# Patient Record
Sex: Female | Born: 1975
Health system: Southern US, Community
[De-identification: ages and names within clinical notes are randomized; demographics above are authoritative.]

## PROBLEM LIST (undated history)

## (undated) DIAGNOSIS — N838 Other noninflammatory disorders of ovary, fallopian tube and broad ligament: Secondary | ICD-10-CM

## (undated) DIAGNOSIS — N200 Calculus of kidney: Secondary | ICD-10-CM

## (undated) DIAGNOSIS — F419 Anxiety disorder, unspecified: Secondary | ICD-10-CM

## (undated) DIAGNOSIS — I10 Essential (primary) hypertension: Secondary | ICD-10-CM

## (undated) DIAGNOSIS — E041 Nontoxic single thyroid nodule: Secondary | ICD-10-CM

## (undated) DIAGNOSIS — F32A Depression, unspecified: Secondary | ICD-10-CM

## (undated) DIAGNOSIS — I739 Peripheral vascular disease, unspecified: Secondary | ICD-10-CM

## (undated) DIAGNOSIS — Z86718 Personal history of other venous thrombosis and embolism: Secondary | ICD-10-CM

## (undated) DIAGNOSIS — D509 Iron deficiency anemia, unspecified: Secondary | ICD-10-CM

## (undated) DIAGNOSIS — K0889 Other specified disorders of teeth and supporting structures: Secondary | ICD-10-CM

## (undated) DIAGNOSIS — E119 Type 2 diabetes mellitus without complications: Secondary | ICD-10-CM

## (undated) DIAGNOSIS — F1910 Other psychoactive substance abuse, uncomplicated: Secondary | ICD-10-CM

## (undated) DIAGNOSIS — K219 Gastro-esophageal reflux disease without esophagitis: Secondary | ICD-10-CM

## (undated) DIAGNOSIS — Z87442 Personal history of urinary calculi: Secondary | ICD-10-CM

## (undated) DIAGNOSIS — R9431 Abnormal electrocardiogram [ECG] [EKG]: Secondary | ICD-10-CM

## (undated) DIAGNOSIS — I779 Disorder of arteries and arterioles, unspecified: Secondary | ICD-10-CM

## (undated) DIAGNOSIS — F329 Major depressive disorder, single episode, unspecified: Secondary | ICD-10-CM

## (undated) HISTORY — DX: Calculus of kidney: N20.0

## (undated) HISTORY — DX: Gastro-esophageal reflux disease without esophagitis: K21.9

## (undated) HISTORY — DX: Hypercalcemia: E83.52

## (undated) HISTORY — DX: Other psychoactive substance abuse, uncomplicated: F19.10

## (undated) HISTORY — PX: TUBAL LIGATION: SHX77

## (undated) HISTORY — DX: Personal history of other venous thrombosis and embolism: Z86.718

## (undated) HISTORY — DX: Anxiety disorder, unspecified: F41.9

---
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
## 1997-11-04 ENCOUNTER — Encounter (HOSPITAL_COMMUNITY): Admission: RE | Admit: 1997-11-04 | Discharge: 1997-11-04 | Payer: Self-pay | Admitting: *Deleted

## 1997-11-04 ENCOUNTER — Inpatient Hospital Stay (HOSPITAL_COMMUNITY): Admission: AD | Admit: 1997-11-04 | Discharge: 1997-11-08 | Payer: Self-pay | Admitting: Obstetrics

## 1998-01-03 ENCOUNTER — Emergency Department (HOSPITAL_COMMUNITY): Admission: EM | Admit: 1998-01-03 | Discharge: 1998-01-03 | Payer: Self-pay | Admitting: Emergency Medicine

## 1999-10-08 ENCOUNTER — Emergency Department (HOSPITAL_COMMUNITY): Admission: EM | Admit: 1999-10-08 | Discharge: 1999-10-08 | Payer: Self-pay | Admitting: Emergency Medicine

## 1999-10-10 ENCOUNTER — Emergency Department (HOSPITAL_COMMUNITY): Admission: EM | Admit: 1999-10-10 | Discharge: 1999-10-10 | Payer: Self-pay | Admitting: *Deleted

## 2001-07-17 ENCOUNTER — Emergency Department (HOSPITAL_COMMUNITY): Admission: EM | Admit: 2001-07-17 | Discharge: 2001-07-18 | Payer: Self-pay | Admitting: Emergency Medicine

## 2001-07-18 ENCOUNTER — Encounter: Payer: Self-pay | Admitting: Emergency Medicine

## 2001-11-10 ENCOUNTER — Emergency Department (HOSPITAL_COMMUNITY): Admission: EM | Admit: 2001-11-10 | Discharge: 2001-11-10 | Payer: Self-pay | Admitting: Emergency Medicine

## 2001-11-12 ENCOUNTER — Emergency Department (HOSPITAL_COMMUNITY): Admission: EM | Admit: 2001-11-12 | Discharge: 2001-11-12 | Payer: Self-pay

## 2002-10-21 ENCOUNTER — Emergency Department (HOSPITAL_COMMUNITY): Admission: EM | Admit: 2002-10-21 | Discharge: 2002-10-21 | Payer: Self-pay | Admitting: Emergency Medicine

## 2002-10-21 ENCOUNTER — Encounter: Payer: Self-pay | Admitting: Emergency Medicine

## 2002-10-22 ENCOUNTER — Encounter: Payer: Self-pay | Admitting: Internal Medicine

## 2002-10-22 ENCOUNTER — Emergency Department (HOSPITAL_COMMUNITY): Admission: EM | Admit: 2002-10-22 | Discharge: 2002-10-22 | Payer: Self-pay | Admitting: Emergency Medicine

## 2002-10-23 ENCOUNTER — Inpatient Hospital Stay (HOSPITAL_COMMUNITY): Admission: EM | Admit: 2002-10-23 | Discharge: 2002-10-23 | Payer: Self-pay | Admitting: Internal Medicine

## 2002-11-06 ENCOUNTER — Emergency Department (HOSPITAL_COMMUNITY): Admission: EM | Admit: 2002-11-06 | Discharge: 2002-11-06 | Payer: Self-pay | Admitting: Emergency Medicine

## 2003-02-07 ENCOUNTER — Emergency Department (HOSPITAL_COMMUNITY): Admission: EM | Admit: 2003-02-07 | Discharge: 2003-02-07 | Payer: Self-pay | Admitting: Emergency Medicine

## 2003-02-22 ENCOUNTER — Encounter: Payer: Self-pay | Admitting: Emergency Medicine

## 2003-02-22 ENCOUNTER — Emergency Department (HOSPITAL_COMMUNITY): Admission: EM | Admit: 2003-02-22 | Discharge: 2003-02-22 | Payer: Self-pay | Admitting: Emergency Medicine

## 2003-03-13 ENCOUNTER — Emergency Department (HOSPITAL_COMMUNITY): Admission: EM | Admit: 2003-03-13 | Discharge: 2003-03-13 | Payer: Self-pay | Admitting: Emergency Medicine

## 2003-05-05 ENCOUNTER — Inpatient Hospital Stay (HOSPITAL_COMMUNITY): Admission: AD | Admit: 2003-05-05 | Discharge: 2003-05-05 | Payer: Self-pay | Admitting: *Deleted

## 2003-05-06 ENCOUNTER — Emergency Department (HOSPITAL_COMMUNITY): Admission: EM | Admit: 2003-05-06 | Discharge: 2003-05-06 | Payer: Self-pay

## 2003-12-09 ENCOUNTER — Emergency Department (HOSPITAL_COMMUNITY): Admission: EM | Admit: 2003-12-09 | Discharge: 2003-12-09 | Payer: Self-pay | Admitting: Emergency Medicine

## 2004-09-05 ENCOUNTER — Emergency Department (HOSPITAL_COMMUNITY): Admission: EM | Admit: 2004-09-05 | Discharge: 2004-09-05 | Payer: Self-pay | Admitting: Family Medicine

## 2004-09-13 ENCOUNTER — Emergency Department (HOSPITAL_COMMUNITY): Admission: EM | Admit: 2004-09-13 | Discharge: 2004-09-13 | Payer: Self-pay | Admitting: Family Medicine

## 2004-09-13 ENCOUNTER — Ambulatory Visit (HOSPITAL_COMMUNITY): Admission: RE | Admit: 2004-09-13 | Discharge: 2004-09-13 | Payer: Self-pay | Admitting: Family Medicine

## 2004-09-16 ENCOUNTER — Ambulatory Visit (HOSPITAL_BASED_OUTPATIENT_CLINIC_OR_DEPARTMENT_OTHER): Admission: RE | Admit: 2004-09-16 | Discharge: 2004-09-16 | Payer: Self-pay | Admitting: Family Medicine

## 2004-09-20 ENCOUNTER — Emergency Department (HOSPITAL_COMMUNITY): Admission: EM | Admit: 2004-09-20 | Discharge: 2004-09-21 | Payer: Self-pay | Admitting: Emergency Medicine

## 2004-09-24 ENCOUNTER — Encounter: Admission: RE | Admit: 2004-09-24 | Discharge: 2004-09-24 | Payer: Self-pay | Admitting: Urology

## 2004-09-25 ENCOUNTER — Ambulatory Visit (HOSPITAL_COMMUNITY): Admission: RE | Admit: 2004-09-25 | Discharge: 2004-09-25 | Payer: Self-pay | Admitting: Urology

## 2004-09-25 ENCOUNTER — Ambulatory Visit (HOSPITAL_BASED_OUTPATIENT_CLINIC_OR_DEPARTMENT_OTHER): Admission: RE | Admit: 2004-09-25 | Discharge: 2004-09-25 | Payer: Self-pay | Admitting: Urology

## 2004-11-29 ENCOUNTER — Emergency Department (HOSPITAL_COMMUNITY): Admission: EM | Admit: 2004-11-29 | Discharge: 2004-11-29 | Payer: Self-pay | Admitting: Family Medicine

## 2004-12-29 ENCOUNTER — Emergency Department (HOSPITAL_COMMUNITY): Admission: EM | Admit: 2004-12-29 | Discharge: 2004-12-29 | Payer: Self-pay | Admitting: Family Medicine

## 2005-03-29 ENCOUNTER — Observation Stay (HOSPITAL_COMMUNITY): Admission: RE | Admit: 2005-03-29 | Discharge: 2005-03-30 | Payer: Self-pay | Admitting: Urology

## 2005-04-09 ENCOUNTER — Ambulatory Visit (HOSPITAL_COMMUNITY): Admission: RE | Admit: 2005-04-09 | Discharge: 2005-04-09 | Payer: Self-pay | Admitting: Urology

## 2005-09-19 ENCOUNTER — Emergency Department (HOSPITAL_COMMUNITY): Admission: EM | Admit: 2005-09-19 | Discharge: 2005-09-19 | Payer: Self-pay | Admitting: Family Medicine

## 2006-02-13 ENCOUNTER — Emergency Department (HOSPITAL_COMMUNITY): Admission: EM | Admit: 2006-02-13 | Discharge: 2006-02-13 | Payer: Self-pay | Admitting: Family Medicine

## 2006-03-15 ENCOUNTER — Emergency Department (HOSPITAL_COMMUNITY): Admission: EM | Admit: 2006-03-15 | Discharge: 2006-03-15 | Payer: Self-pay | Admitting: Family Medicine

## 2006-06-22 ENCOUNTER — Encounter: Admission: RE | Admit: 2006-06-22 | Discharge: 2006-06-22 | Payer: Self-pay | Admitting: Family Medicine

## 2006-07-13 ENCOUNTER — Emergency Department (HOSPITAL_COMMUNITY): Admission: EM | Admit: 2006-07-13 | Discharge: 2006-07-13 | Payer: Self-pay | Admitting: Family Medicine

## 2007-05-26 ENCOUNTER — Emergency Department (HOSPITAL_COMMUNITY): Admission: EM | Admit: 2007-05-26 | Discharge: 2007-05-26 | Payer: Self-pay | Admitting: Emergency Medicine

## 2007-06-19 ENCOUNTER — Emergency Department (HOSPITAL_COMMUNITY): Admission: EM | Admit: 2007-06-19 | Discharge: 2007-06-19 | Payer: Self-pay | Admitting: Emergency Medicine

## 2008-01-25 ENCOUNTER — Emergency Department (HOSPITAL_COMMUNITY): Admission: EM | Admit: 2008-01-25 | Discharge: 2008-01-25 | Payer: Self-pay | Admitting: Family Medicine

## 2008-03-05 ENCOUNTER — Emergency Department (HOSPITAL_COMMUNITY): Admission: EM | Admit: 2008-03-05 | Discharge: 2008-03-05 | Payer: Self-pay | Admitting: Emergency Medicine

## 2008-07-28 ENCOUNTER — Emergency Department (HOSPITAL_COMMUNITY): Admission: EM | Admit: 2008-07-28 | Discharge: 2008-07-28 | Payer: Self-pay | Admitting: Emergency Medicine

## 2008-08-09 ENCOUNTER — Ambulatory Visit (HOSPITAL_COMMUNITY): Admission: RE | Admit: 2008-08-09 | Discharge: 2008-08-09 | Payer: Self-pay | Admitting: Gastroenterology

## 2008-08-22 ENCOUNTER — Encounter: Admission: RE | Admit: 2008-08-22 | Discharge: 2008-08-22 | Payer: Self-pay | Admitting: Gastroenterology

## 2008-10-06 ENCOUNTER — Emergency Department (HOSPITAL_COMMUNITY): Admission: EM | Admit: 2008-10-06 | Discharge: 2008-10-06 | Payer: Self-pay | Admitting: Emergency Medicine

## 2009-11-10 ENCOUNTER — Observation Stay (HOSPITAL_COMMUNITY): Admission: EM | Admit: 2009-11-10 | Discharge: 2009-11-10 | Payer: Self-pay | Admitting: Emergency Medicine

## 2009-11-17 LAB — HM PAP SMEAR: HM Pap smear: NORMAL

## 2009-12-10 ENCOUNTER — Ambulatory Visit (HOSPITAL_COMMUNITY): Admission: RE | Admit: 2009-12-10 | Discharge: 2009-12-10 | Payer: Self-pay | Admitting: Surgery

## 2010-01-30 ENCOUNTER — Encounter (INDEPENDENT_AMBULATORY_CARE_PROVIDER_SITE_OTHER): Payer: Self-pay | Admitting: Surgery

## 2010-01-30 ENCOUNTER — Ambulatory Visit (HOSPITAL_COMMUNITY): Admission: RE | Admit: 2010-01-30 | Discharge: 2010-01-31 | Payer: Self-pay | Admitting: Surgery

## 2010-02-01 HISTORY — PX: OTHER SURGICAL HISTORY: SHX169

## 2010-08-25 ENCOUNTER — Emergency Department (HOSPITAL_COMMUNITY): Admission: EM | Admit: 2010-08-25 | Discharge: 2010-08-25 | Payer: Self-pay | Admitting: Emergency Medicine

## 2010-09-30 ENCOUNTER — Emergency Department (HOSPITAL_COMMUNITY)
Admission: EM | Admit: 2010-09-30 | Discharge: 2010-09-30 | Payer: Self-pay | Source: Home / Self Care | Admitting: Emergency Medicine

## 2010-10-24 ENCOUNTER — Encounter: Payer: Self-pay | Admitting: Family Medicine

## 2010-12-14 LAB — URINALYSIS, ROUTINE W REFLEX MICROSCOPIC
Bilirubin Urine: NEGATIVE
Glucose, UA: 100 mg/dL — AB
Hgb urine dipstick: NEGATIVE
Ketones, ur: NEGATIVE mg/dL
Nitrite: NEGATIVE
Protein, ur: NEGATIVE mg/dL
Specific Gravity, Urine: 1.015 (ref 1.005–1.030)
Urobilinogen, UA: 0.2 mg/dL (ref 0.0–1.0)
pH: 7 (ref 5.0–8.0)

## 2010-12-14 LAB — DIFFERENTIAL
Basophils Absolute: 0.1 10*3/uL (ref 0.0–0.1)
Basophils Relative: 1 % (ref 0–1)
Eosinophils Absolute: 0.7 10*3/uL (ref 0.0–0.7)
Eosinophils Relative: 5 % (ref 0–5)
Lymphocytes Relative: 25 % (ref 12–46)
Lymphs Abs: 3.9 10*3/uL (ref 0.7–4.0)
Monocytes Absolute: 1 10*3/uL (ref 0.1–1.0)
Monocytes Relative: 6 % (ref 3–12)
Neutro Abs: 10.2 10*3/uL — ABNORMAL HIGH (ref 1.7–7.7)
Neutrophils Relative %: 64 % (ref 43–77)

## 2010-12-14 LAB — COMPREHENSIVE METABOLIC PANEL
ALT: 22 U/L (ref 0–35)
AST: 18 U/L (ref 0–37)
Albumin: 3.6 g/dL (ref 3.5–5.2)
Alkaline Phosphatase: 72 U/L (ref 39–117)
BUN: 14 mg/dL (ref 6–23)
CO2: 25 mEq/L (ref 19–32)
Calcium: 9.7 mg/dL (ref 8.4–10.5)
Chloride: 108 mEq/L (ref 96–112)
Creatinine, Ser: 1.27 mg/dL — ABNORMAL HIGH (ref 0.4–1.2)
GFR calc Af Amer: 58 mL/min — ABNORMAL LOW (ref >60)
GFR calc non Af Amer: 48 mL/min — ABNORMAL LOW (ref >60)
Glucose, Bld: 131 mg/dL — ABNORMAL HIGH (ref 70–99)
Potassium: 4.2 mEq/L (ref 3.5–5.1)
Sodium: 141 mEq/L (ref 135–145)
Total Bilirubin: 0.3 mg/dL (ref 0.3–1.2)
Total Protein: 7 g/dL (ref 6.0–8.3)

## 2010-12-14 LAB — URINE MICROSCOPIC-ADD ON

## 2010-12-14 LAB — CBC
HCT: 45.5 % (ref 36.0–46.0)
Hemoglobin: 15.2 g/dL — ABNORMAL HIGH (ref 12.0–15.0)
MCH: 31.7 pg (ref 26.0–34.0)
MCHC: 33.4 g/dL (ref 30.0–36.0)
MCV: 95 fL (ref 78.0–100.0)
Platelets: 353 10*3/uL (ref 150–400)
RBC: 4.79 MIL/uL (ref 3.87–5.11)
RDW: 14.3 % (ref 11.5–15.5)
WBC: 16 10*3/uL — ABNORMAL HIGH (ref 4.0–10.5)

## 2010-12-14 LAB — PREGNANCY, URINE: Preg Test, Ur: NEGATIVE

## 2010-12-15 LAB — URINALYSIS, ROUTINE W REFLEX MICROSCOPIC
Bilirubin Urine: NEGATIVE
Glucose, UA: NEGATIVE mg/dL
Hgb urine dipstick: NEGATIVE
Ketones, ur: NEGATIVE mg/dL
Nitrite: NEGATIVE
Protein, ur: NEGATIVE mg/dL
Specific Gravity, Urine: 1.014 (ref 1.005–1.030)
Urobilinogen, UA: 0.2 mg/dL (ref 0.0–1.0)
pH: 6.5 (ref 5.0–8.0)

## 2010-12-15 LAB — URINE MICROSCOPIC-ADD ON

## 2010-12-19 ENCOUNTER — Emergency Department (HOSPITAL_COMMUNITY): Payer: Medicaid Other

## 2010-12-19 ENCOUNTER — Emergency Department (HOSPITAL_COMMUNITY)
Admission: EM | Admit: 2010-12-19 | Discharge: 2010-12-19 | Disposition: A | Discharge status: Home / Self Care | Payer: Medicaid Other | Attending: Emergency Medicine | Admitting: Emergency Medicine

## 2010-12-19 DIAGNOSIS — Q760 Spina bifida occulta: Secondary | ICD-10-CM | POA: Insufficient documentation

## 2010-12-19 DIAGNOSIS — N200 Calculus of kidney: Secondary | ICD-10-CM | POA: Insufficient documentation

## 2010-12-19 DIAGNOSIS — I10 Essential (primary) hypertension: Secondary | ICD-10-CM | POA: Insufficient documentation

## 2010-12-19 DIAGNOSIS — R109 Unspecified abdominal pain: Secondary | ICD-10-CM | POA: Insufficient documentation

## 2010-12-19 LAB — URINALYSIS, ROUTINE W REFLEX MICROSCOPIC
Bilirubin Urine: NEGATIVE
Glucose, UA: NEGATIVE mg/dL
Hgb urine dipstick: NEGATIVE
Ketones, ur: NEGATIVE mg/dL
Nitrite: NEGATIVE
Protein, ur: NEGATIVE mg/dL
Specific Gravity, Urine: 1.016 (ref 1.005–1.030)
Urobilinogen, UA: 0.2 mg/dL (ref 0.0–1.0)
pH: 7 (ref 5.0–8.0)

## 2010-12-19 LAB — POCT I-STAT, CHEM 8
BUN: 7 mg/dL (ref 6–23)
Calcium, Ion: 1.19 mmol/L (ref 1.12–1.32)
Chloride: 104 mEq/L (ref 96–112)
Creatinine, Ser: 1.7 mg/dL — ABNORMAL HIGH (ref 0.4–1.2)
Glucose, Bld: 116 mg/dL — ABNORMAL HIGH (ref 70–99)
HCT: 47 % — ABNORMAL HIGH (ref 36.0–46.0)
Hemoglobin: 16 g/dL — ABNORMAL HIGH (ref 12.0–15.0)
Potassium: 4.4 mEq/L (ref 3.5–5.1)
Sodium: 141 mEq/L (ref 135–145)
TCO2: 25 mmol/L (ref 0–100)

## 2010-12-19 LAB — POCT PREGNANCY, URINE: Preg Test, Ur: NEGATIVE

## 2010-12-19 LAB — URINE MICROSCOPIC-ADD ON

## 2010-12-22 LAB — COMPREHENSIVE METABOLIC PANEL
ALT: 18 U/L (ref 0–35)
AST: 19 U/L (ref 0–37)
Albumin: 3.8 g/dL (ref 3.5–5.2)
Alkaline Phosphatase: 94 U/L (ref 39–117)
BUN: 12 mg/dL (ref 6–23)
CO2: 25 mEq/L (ref 19–32)
Calcium: 12.2 mg/dL — ABNORMAL HIGH (ref 8.4–10.5)
Chloride: 110 mEq/L (ref 96–112)
Creatinine, Ser: 1.51 mg/dL — ABNORMAL HIGH (ref 0.4–1.2)
GFR calc Af Amer: 47 mL/min — ABNORMAL LOW (ref >60)
GFR calc non Af Amer: 39 mL/min — ABNORMAL LOW (ref >60)
Glucose, Bld: 95 mg/dL (ref 70–99)
Potassium: 4.7 mEq/L (ref 3.5–5.1)
Sodium: 140 mEq/L (ref 135–145)
Total Bilirubin: 0.3 mg/dL (ref 0.3–1.2)
Total Protein: 6.8 g/dL (ref 6.0–8.3)

## 2010-12-22 LAB — PREGNANCY, URINE: Preg Test, Ur: NEGATIVE

## 2010-12-22 LAB — CALCIUM
Calcium: 10.4 mg/dL (ref 8.4–10.5)
Calcium: 10.5 mg/dL (ref 8.4–10.5)

## 2010-12-23 LAB — URINALYSIS, ROUTINE W REFLEX MICROSCOPIC
Bilirubin Urine: NEGATIVE
Glucose, UA: NEGATIVE mg/dL
Ketones, ur: NEGATIVE mg/dL
Nitrite: NEGATIVE
Protein, ur: NEGATIVE mg/dL
Specific Gravity, Urine: 1.01 (ref 1.005–1.030)
Urobilinogen, UA: 0.2 mg/dL (ref 0.0–1.0)
pH: 6 (ref 5.0–8.0)

## 2010-12-23 LAB — POCT PREGNANCY, URINE: Preg Test, Ur: NEGATIVE

## 2010-12-23 LAB — URINE MICROSCOPIC-ADD ON

## 2010-12-23 LAB — CBC
HCT: 41.4 % (ref 36.0–46.0)
Hemoglobin: 14 g/dL (ref 12.0–15.0)
MCHC: 33.7 g/dL (ref 30.0–36.0)
MCV: 98.5 fL (ref 78.0–100.0)
Platelets: 232 10*3/uL (ref 150–400)
RBC: 4.2 MIL/uL (ref 3.87–5.11)
RDW: 14.6 % (ref 11.5–15.5)
WBC: 10.3 10*3/uL (ref 4.0–10.5)

## 2010-12-23 LAB — POCT I-STAT, CHEM 8
BUN: 9 mg/dL (ref 6–23)
Calcium, Ion: 1.62 mmol/L — ABNORMAL HIGH (ref 1.12–1.32)
Chloride: 110 mEq/L (ref 96–112)
Creatinine, Ser: 1.3 mg/dL — ABNORMAL HIGH (ref 0.4–1.2)
Glucose, Bld: 118 mg/dL — ABNORMAL HIGH (ref 70–99)
HCT: 42 % (ref 36.0–46.0)
Hemoglobin: 14.3 g/dL (ref 12.0–15.0)
Potassium: 4.5 mEq/L (ref 3.5–5.1)
Sodium: 139 mEq/L (ref 135–145)
TCO2: 26 mmol/L (ref 0–100)

## 2010-12-23 LAB — DIFFERENTIAL
Basophils Absolute: 0.1 10*3/uL (ref 0.0–0.1)
Basophils Relative: 1 % (ref 0–1)
Eosinophils Absolute: 0.3 10*3/uL (ref 0.0–0.7)
Eosinophils Relative: 3 % (ref 0–5)
Lymphocytes Relative: 28 % (ref 12–46)
Lymphs Abs: 2.9 10*3/uL (ref 0.7–4.0)
Monocytes Absolute: 0.5 10*3/uL (ref 0.1–1.0)
Monocytes Relative: 5 % (ref 3–12)
Neutro Abs: 6.5 10*3/uL (ref 1.7–7.7)
Neutrophils Relative %: 63 % (ref 43–77)

## 2011-01-18 LAB — URINE CULTURE: Colony Count: 30000

## 2011-01-18 LAB — POCT I-STAT, CHEM 8
BUN: 15 mg/dL (ref 6–23)
Calcium, Ion: 1.67 mmol/L — ABNORMAL HIGH (ref 1.12–1.32)
Chloride: 111 mEq/L (ref 96–112)
Creatinine, Ser: 1.7 mg/dL — ABNORMAL HIGH (ref 0.4–1.2)
Glucose, Bld: 70 mg/dL (ref 70–99)
HCT: 45 % (ref 36.0–46.0)
Hemoglobin: 15.3 g/dL — ABNORMAL HIGH (ref 12.0–15.0)
Potassium: 4.2 mEq/L (ref 3.5–5.1)
Sodium: 141 mEq/L (ref 135–145)
TCO2: 22 mmol/L (ref 0–100)

## 2011-01-18 LAB — CBC
HCT: 41.5 % (ref 36.0–46.0)
Hemoglobin: 13.6 g/dL (ref 12.0–15.0)
MCHC: 32.8 g/dL (ref 30.0–36.0)
MCV: 92.7 fL (ref 78.0–100.0)
Platelets: 345 10*3/uL (ref 150–400)
RBC: 4.48 MIL/uL (ref 3.87–5.11)
RDW: 14 % (ref 11.5–15.5)
WBC: 10.1 10*3/uL (ref 4.0–10.5)

## 2011-01-18 LAB — DIFFERENTIAL
Basophils Absolute: 0.1 10*3/uL (ref 0.0–0.1)
Basophils Relative: 1 % (ref 0–1)
Eosinophils Absolute: 0.7 10*3/uL (ref 0.0–0.7)
Eosinophils Relative: 7 % — ABNORMAL HIGH (ref 0–5)
Lymphocytes Relative: 27 % (ref 12–46)
Lymphs Abs: 2.7 10*3/uL (ref 0.7–4.0)
Monocytes Absolute: 0.7 10*3/uL (ref 0.1–1.0)
Monocytes Relative: 7 % (ref 3–12)
Neutro Abs: 5.8 10*3/uL (ref 1.7–7.7)
Neutrophils Relative %: 58 % (ref 43–77)

## 2011-01-18 LAB — URINALYSIS, ROUTINE W REFLEX MICROSCOPIC
Bilirubin Urine: NEGATIVE
Glucose, UA: NEGATIVE mg/dL
Ketones, ur: NEGATIVE mg/dL
Nitrite: NEGATIVE
Protein, ur: 30 mg/dL — AB
Specific Gravity, Urine: 1.007 (ref 1.005–1.030)
Urobilinogen, UA: 0.2 mg/dL (ref 0.0–1.0)
pH: 6.5 (ref 5.0–8.0)

## 2011-01-18 LAB — URINE MICROSCOPIC-ADD ON

## 2011-01-18 LAB — PREGNANCY, URINE: Preg Test, Ur: NEGATIVE

## 2011-02-19 NOTE — Op Note (Signed)
Terri Sparks, Terri Sparks               ACCOUNT NO.:  [id number]   MEDICAL RECORD NO.:  [id number]          PATIENT TYPE:  AMB   LOCATION:  [facility]                         FACILITY:  [facility]   PHYSICIAN:  Shanda Bumps., M.D.DATE OF BIRTH:  May 26, 1976   DATE OF PROCEDURE:  09/25/2004  DATE OF DISCHARGE:                                 OPERATIVE REPORT   PREOPERATIVE DIAGNOSIS:  Right distal ureteral calculi (8 and 10 mm,  respectively).   POSTOPERATIVE DIAGNOSIS:  Right distal ureteral calculi (8 and 10 mm,  respectively).   OPERATION:  1.  Cysto.  2.  Right retrograde pyelogram.  3.  Ureteroscopy with holmium lasertripsy, insert right ureteral stent.   ANESTHESIA:  General.   SURGEON:  Corky Downs, M.D.   BRIEF HISTORY:  This 35 year old single white female presented with a 1  month history of right flank pain, was found to have some proximal right  ureteral stones on CT scan, December 11 but on a CT scan done yesterday, she  had 8 and 10 mm stones distal with high-grade hydro.  She also has large  bilateral stones, the largest of which is in the left kidney which measures  1.9 cm.  She had an episode of stones about 2 years ago but never passed  anything.  She enters now for ureteroscopy of these large stones in the  distal right ureter causing obstruction.  The patient was placed on the  operating table in dorsal lithotomy position, after satisfactory induction  of general anesthesia, was prepped and draped with Betadine and given IV  antibiotics.  The bladder was carefully inspected.  It was found to be free  of any mucosal lesions.  The right orifice was somewhat patulous, and an  open-ended 6 ureteral catheter was inserted, and a retrograde demonstrated  that the stones were indeed impacted in the lower ureter, and I could barely  get a little dye to past them.  I used a Glidewire to go past the stones but  could not advance the open-ended catheter.  I then  removed the Glidewire and  inserted a guidewire.  I could get this past the stones.  I then removed the  open-ended catheter and left the guidewire as a safety wire.   I then passed the 6 short ureteroscope up to the first stone which was about  8-10 cm from the UV junction.  Using the holmium laser at first 0.5 watts  and then 0.6, I was able to carefully break the stone as I advanced the  scope.  The first stone was about 10 mm in length, and I had to kind of just  tunnel through that with the ureteroscope and the laser using the camera and  then once I got past that, the second stone had to be treated in the same  way.  The stone did break up fairly well.  It was fairly soft.  Once I got  past the most proximal stone, I then used the four-wire basket and retrieved  as many of the fragments as I could.  There  were a few small fragments left  indwelling, but I could easily pass the scope up past where the stones were  at that point.  I then, over the guidewire, passed the cystoscope and fed a  6 French x 26 cm length double-J ureteral stent under fluoroscopy up to the  level of the kidney and  removed the guidewire with the stent in good position.  The bladder was  drained and the stones collected and the patient given a B&O suppository and  some Toradol IV.  She will be later discharged as an outpatient, will come  back in about 10 days to have the stent removed at that time.      HMK/MEDQ  D:  09/25/2004  T:  09/25/2004  Job:  [id number]

## 2011-02-19 NOTE — Procedures (Signed)
Terri Sparks, Terri Sparks               ACCOUNT NO.:  [id number]   MEDICAL RECORD NO.:  [id number]          PATIENT TYPE:  OUT   LOCATION:  SLEEP CENTER                 FACILITY:  [facility]   PHYSICIAN:  Clinton D. Annamaria Boots, M.D. DATE OF BIRTH:  05-16-1976   DATE OF STUDY:                              NOCTURNAL POLYSOMNOGRAM   REFERRING PHYSICIAN:  Redge Gainer, MD   INDICATION FOR STUDY:  Hypersomnia with sleep apnea.   EPWORTH SLEEPINESS SCORE:  8/24.   BMI:  38.6.   WEIGHT:  270 pounds.   The patient is taking pain medication for kidney stones.   SLEEP ARCHITECTURE:  Total sleep time 301 minutes, with sleep efficiency  86%.  Stage 1 with 3%, stage 2 83%, stages 3 and 4 were absent.  REM was 13%  of total sleep time.  Sleep latency 10.5 minutes, REM latency 69 minutes.  Awake after sleep onset 39 minutes.  Arousal index 11.  She took pain  medication just before lights out, and had two trips to the bathroom which  she attributed to prescribed increased fluid intake.   RESPIRATORY DATA:  RDI 4.8 obstructive events per hour, which is within  normal limits and does not meet diagnostic criteria for sleep disordered  breathing.  A total of one central apnea, two obstructive apneas, and 21  hypopneas were noted.  Events were not positional.  REM RDI was 13.  She did  not qualify for split study protocol because of insufficient events to  trigger CPAP titration on this study night.   OXYGEN DATA:  Light to moderate snoring, with desaturation to 88%.  Mean  oxygen saturation through the study was 95% on room air.   CARDIAC DATA:  Normal sinus rhythm.   MOVEMENT/PARASOMNIA:  Occasional leg jerks with arousals, insignificant.   IMPRESSION/RECOMMENDATION:  Occasional sleep disordered breathing events,  not qualifying for a diagnosis of obstructive sleep apnea/hypopnea syndrome,  RDI 4.8 per hour.  There may be occasional breakthrough nights related to  nasal congestion, sedation, or sleeping  on flat of back when scores would be  higher.                                                           Clinton D. Annamaria Boots, M.D.  Diplomate, [employer] Board   CDY/MEDQ  D:  09/20/2004 13:19:36  T:  09/21/2004 17:17:51  Job:  [id number]

## 2011-02-19 NOTE — Op Note (Signed)
NAMEELIZABETHROSE, Terri Sparks               ACCOUNT NO.:  [id number]   MEDICAL RECORD NO.:  [id number]          PATIENT TYPE:  AMB   LOCATION:  DAY                          FACILITY:  [place]   PHYSICIAN:  Mark C. Karsten Ro, M.D.  DATE OF BIRTH:  26-Jun-1976   DATE OF PROCEDURE:  04/09/2005  DATE OF DISCHARGE:                                 OPERATIVE REPORT   PREOPERATIVE DIAGNOSIS:  Left renal calculi.   POSTOPERATIVE DIAGNOSIS:  Left renal calculi.   PROCEDURE:  Second look percutaneous nephrostomy and percutaneous stone  extraction(>2cm.).   SURGEON:  Mark C. Karsten Ro, M.D.   ANESTHESIA:  General.   SPECIMENS:  None.   BLOOD LOSS:  Minimal.   COMPLICATIONS:  None.   DRAINS:  None.   INDICATIONS:  The patient is a 35 year old white female who had a very large  left renal calculus. She underwent percutaneous nephrostolithotomy a little  over a week ago with debulking of approximately 90% of the stone. There were  a few stones remaining and I left the percutaneous tube in place in order to  addressed these. She is brought to the OR today for second look nephroscopy  and removal of as many of the remaining stones as possible. The risks,  complications and alternatives were discussed with her. She understands and  elected to proceed.   DESCRIPTION OF OPERATION:  After informed consent, the patient was brought  to the  major OR, placed on the table and administered general anesthesia  and then moved to the prone position. She was padded appropriately with all  pressure points and then padded. A Foley catheter was placed in the bladder.  Her nephrostomy tube was then removed and her flank was sterilely prepped  and draped. A 17 French flexible cystoscope was then introduced per the  nephrostomy tract into the kidney. I was able to identify several stones  initially and grasped these with a nitinol basket and extracted those. I  then continued to inspect the lower pole and found several  other large  stones, grasped these and extracted those as well. I then negotiated the  scope into the upper pole. I noted a single stone in an upper pole but there  was a very stenosed infundibulum and the stone appeared to be adherent to  the upper pole papilla. I was unable to get the basket or any other grasping  devices up into that upper pole through the very small infundibulum to grasp  the stone therefore it was left in situ. I then relooked and found a few  other small stone fragments. These were grasped as well and extracted. The  remaining fragments were sand grain size and too small to practically grasp  and extract. I therefore removed the flexible cystoscope after fluoroscopic  confirmation that all the stones except the one in the upper pole appeared  to be gone and there and therefore closed the skin of the nephrostomy tract  with Steri-Strips. A sterile occlusive dressing was applied and the patient  was awakened and  taken to the recovery room in stable satisfactory condition.  She tolerated  the procedure well with no intraoperative complications. She will finish up  her antibiotics and be given a prescription for Tylox and will follow-up in  my office in four weeks for a final check.       MCO/MEDQ  D:  04/09/2005  T:  04/09/2005  Job:  [id number]

## 2011-02-19 NOTE — Op Note (Signed)
Terri Sparks, GEIGER               ACCOUNT NO.:  [id number]   MEDICAL RECORD NO.:  [id number]          PATIENT TYPE:  AMB   LOCATION:  DAY                          FACILITY:  [facility]   PHYSICIAN:  Westley Foots, M.D.   DATE OF BIRTH:  [birth year]   DATE OF PROCEDURE:  03/29/2005  DATE OF DISCHARGE:                                 OPERATIVE REPORT   PREOPERATIVE DIAGNOSIS:  Left kidney stones.   POSTOPERATIVE DIAGNOSIS:  Left kidney stones.   PROCEDURE:  Left percutaneous nephrostomy with nephrostolithotomy pass of  the left percutaneous nephrostolithotomy (stone mass greater than 2 cm).   ATTENDING SURGEON:  Mark C. Karsten Ro, M.D.   RESIDENT SURGEON:  Westley Foots, M.D.   INTERVENTIONAL RADIOLOGIST:  Eulas Post T. Kathlene Cote, M.D.   ANESTHESIA:  General endotracheal anesthesia.   COMPLICATIONS:  None.   INDICATIONS FOR PROCEDURE:  Terri Sparks is a 35 year old female who  [facility] Dr. Serita Butcher for a  history of kidney stones.  On evaluation recently the patient noted that she  had a kidney stone approximately two years ago but was seen and treated in  the emergency room, and never passed a stone.  The  CT scan done at [facility] on September 13, 2004, showed acute moderate right-sided  hydronephrosis on the basis of two obstructing calculi in the proximal right  ureter that measured 5 and 8 mm, respectively.  She had several calculi  present in the right kidney and multiple left-sided stones.  Due to the fact  that a large stone was located within the left renal pelvis as well as a  second large stone located in the left lower pole with infundibular stenosis  between that calix and the renal pelvis, a decision was made to proceed with  a percutaneous approach for treatment of her left kidney stones.  After  discussing this procedure with the patient as well as the risks and  consequences associated with such a surgery, she has  elected to proceed.   PROCEDURE IN DETAIL:  The patient was brought to the operating room.  Following induction of general endotracheal anesthesia, she was placed in  the prone position and prepped and draped usual sterile fashion.  Dr.  Kathlene Cote and his team subsequently performed the access and nephrostomy  tract dilation portions of the procedure.  Note that this point the  procedure will be dictated separately.  After the nephrostomy access had  been obtained and the track dilated with a NephroMax dilator, treatment of  the stones was initiated. The original plan was to manage the stones using a  lithoclast ultrasound.  However, the lithoclast segment of the equipment  would not work.  Ultrasound was subsequently used to fragment the large 2 cm  left renal pelvis stone into multiple pieces.  These pieces were  subsequently removed using a three-pronged nephroscopic grasper.  The access  sheath was subsequently pulled back into the left lower pole, and the  multiple stones within the left lower pole and distal to the stenotic  infundibulum  were removed using a combination of the three-pronged grasper  as well as the ultrasound to suction the stone fragments out.  At this  point, fluoroscopy was used to confirm that all stone fragments had been  removed. The ultrasound device was subsequently removed from the  nephroscope, and a 16-French Council tip was passed over the safety wire and  into position within the left renal pelvis. The balloon was subsequent  inflated with approximately 5 mL of sterile water, and the nephrostomy tube  was sutured into position using 2-0 silk suture.  The nephrostomy tube site  was subsequently dressed. The patient allowed to awaken. and the case was  ended. The patient tolerated the procedure well and there no complications.  Please note that Dr. Kathie Rhodes was present entire case and assisted in  all aspects of the procedure.       JP/MEDQ  D:   03/29/2005  T:  03/29/2005  Job:  [id number]

## 2011-02-19 NOTE — Discharge Summary (Signed)
   NAME:  Terri Sparks, Terri Sparks                         ACCOUNT NO.:  [id number]   MEDICAL RECORD NO.:  [id number]                   PATIENT TYPE:  EMS   LOCATION:  ED                                   [facility]ACILITY:  [facility]   PHYSICIAN:  Judithann Graves, M.D.        DATE OF BIRTH:  12-Mar-1976   DATE OF ADMISSION:  10/22/2002  DATE OF DISCHARGE:  10/23/2002                                 DISCHARGE SUMMARY   HOSPITAL COURSE:  The patient was admitted on October 22, 2002, with arm  pain.  Her workup in the emergency room included an ultrasound which were  significant for an axillary vein thrombosis.  She had a spiral CT which was  negative for any PE although it did show some adenopathy.  The patient was  begun on Lovenox and Coumadin.  On October 23, 2002, the patient expressed a  strong desire to go home as she had many familial obligations.  Disposition  was discussed at length with her and social work and in conjunction with Dr.  Everlean Cherry, who agreed to manage her case as an outpatient.  The patient was  discharged home in stable condition on overlapping Coumadin and Lovenox.   DISCHARGE MEDICATIONS:  Lovenox 140 mg subcutaneous b.i.d. to be given until  her INR is between 2 and 3 and overlapped for approximately two days on  Coumadin 5 mg p.o. daily.   DISCHARGE PLAN:  1. The patient was given a five-day supply of medicine.  2. An appointment for her at Dr. Junius Creamer office at 4:15 p.m. on October 25, 2002, at which point they will check another INR and adjust her Coumadin     dose.  3. The patient is to go to social services tomorrow to renew her Medicaid.  4. She will need a primary care doctor to follow up repeat chest CT in three     months.                                               Judithann Graves, M.D.    AMC/MEDQ  D:  10/23/2002  T:  10/23/2002  Job:  [id number]   cc:   Woody Seller., M.D.  [address]  [address] [place]  [place] [address]  Fax:  [phone]

## 2011-03-26 ENCOUNTER — Encounter: Payer: Self-pay | Admitting: Family Medicine

## 2011-03-26 DIAGNOSIS — Z86718 Personal history of other venous thrombosis and embolism: Secondary | ICD-10-CM | POA: Insufficient documentation

## 2011-03-26 DIAGNOSIS — N200 Calculus of kidney: Secondary | ICD-10-CM | POA: Insufficient documentation

## 2011-03-26 DIAGNOSIS — E213 Hyperparathyroidism, unspecified: Secondary | ICD-10-CM | POA: Insufficient documentation

## 2011-03-26 DIAGNOSIS — F411 Generalized anxiety disorder: Secondary | ICD-10-CM | POA: Insufficient documentation

## 2011-03-26 DIAGNOSIS — K219 Gastro-esophageal reflux disease without esophagitis: Secondary | ICD-10-CM | POA: Insufficient documentation

## 2011-03-26 DIAGNOSIS — F419 Anxiety disorder, unspecified: Secondary | ICD-10-CM | POA: Insufficient documentation

## 2011-07-06 LAB — COMPREHENSIVE METABOLIC PANEL
ALT: 15
AST: 15
Albumin: 3.5
Alkaline Phosphatase: 75
BUN: 15
CO2: 25
Calcium: 12.1 — ABNORMAL HIGH
Chloride: 111
Creatinine, Ser: 1.56 — ABNORMAL HIGH
GFR calc Af Amer: 46 — ABNORMAL LOW
GFR calc non Af Amer: 38 — ABNORMAL LOW
Glucose, Bld: 123 — ABNORMAL HIGH
Potassium: 4.5
Sodium: 138
Total Bilirubin: 0.5
Total Protein: 6.4

## 2011-07-06 LAB — URINALYSIS, ROUTINE W REFLEX MICROSCOPIC
Bilirubin Urine: NEGATIVE
Glucose, UA: NEGATIVE
Ketones, ur: NEGATIVE
Nitrite: POSITIVE — AB
Protein, ur: 100 — AB
Specific Gravity, Urine: 1.015
Urobilinogen, UA: 0.2
pH: 6

## 2011-07-06 LAB — URINE MICROSCOPIC-ADD ON

## 2011-07-06 LAB — LIPASE, BLOOD: Lipase: 27

## 2011-07-06 LAB — POCT PREGNANCY, URINE: Preg Test, Ur: NEGATIVE

## 2011-07-15 LAB — URINALYSIS, ROUTINE W REFLEX MICROSCOPIC
Bilirubin Urine: NEGATIVE
Glucose, UA: NEGATIVE
Ketones, ur: NEGATIVE
Nitrite: NEGATIVE
Protein, ur: 100 — AB
Specific Gravity, Urine: 1.011
Urobilinogen, UA: 0.2
pH: 6.5

## 2011-07-15 LAB — URINE CULTURE: Colony Count: 50000

## 2011-07-15 LAB — URINE MICROSCOPIC-ADD ON

## 2011-07-15 LAB — PREGNANCY, URINE: Preg Test, Ur: NEGATIVE

## 2012-05-10 ENCOUNTER — Emergency Department (HOSPITAL_COMMUNITY)
Admission: EM | Admit: 2012-05-10 | Discharge: 2012-05-10 | Disposition: A | Discharge status: Home / Self Care | Payer: Medicaid Other | Attending: Emergency Medicine | Admitting: Emergency Medicine

## 2012-05-10 ENCOUNTER — Encounter (HOSPITAL_COMMUNITY): Payer: Self-pay

## 2012-05-10 DIAGNOSIS — Z87442 Personal history of urinary calculi: Secondary | ICD-10-CM | POA: Insufficient documentation

## 2012-05-10 DIAGNOSIS — N390 Urinary tract infection, site not specified: Secondary | ICD-10-CM | POA: Insufficient documentation

## 2012-05-10 DIAGNOSIS — L509 Urticaria, unspecified: Secondary | ICD-10-CM | POA: Insufficient documentation

## 2012-05-10 DIAGNOSIS — K219 Gastro-esophageal reflux disease without esophagitis: Secondary | ICD-10-CM | POA: Insufficient documentation

## 2012-05-10 LAB — URINALYSIS, ROUTINE W REFLEX MICROSCOPIC
Bilirubin Urine: NEGATIVE
Glucose, UA: NEGATIVE mg/dL
Hgb urine dipstick: NEGATIVE
Ketones, ur: NEGATIVE mg/dL
Nitrite: NEGATIVE
Protein, ur: NEGATIVE mg/dL
Specific Gravity, Urine: 1.011 (ref 1.005–1.030)
Urobilinogen, UA: 0.2 mg/dL (ref 0.0–1.0)
pH: 6.5 (ref 5.0–8.0)

## 2012-05-10 LAB — URINE MICROSCOPIC-ADD ON

## 2012-05-10 LAB — PREGNANCY, URINE: Preg Test, Ur: NEGATIVE

## 2012-05-10 MED ORDER — FAMOTIDINE 20 MG PO TABS
20.0000 mg | ORAL_TABLET | Freq: Once | ORAL | Status: AC
  Administered 2012-05-10: 20 mg via ORAL
  Filled 2012-05-10: qty 1

## 2012-05-10 MED ORDER — OXYCODONE-ACETAMINOPHEN 5-325 MG PO TABS
1.0000 | ORAL_TABLET | Freq: Once | ORAL | Status: AC
  Administered 2012-05-10: 1 via ORAL
  Filled 2012-05-10: qty 1

## 2012-05-10 MED ORDER — DIPHENHYDRAMINE HCL 25 MG PO CAPS
50.0000 mg | ORAL_CAPSULE | Freq: Once | ORAL | Status: AC
  Administered 2012-05-10: 50 mg via ORAL
  Filled 2012-05-10: qty 2

## 2012-05-10 MED ORDER — PREDNISONE 20 MG PO TABS
60.0000 mg | ORAL_TABLET | Freq: Once | ORAL | Status: AC
  Administered 2012-05-10: 60 mg via ORAL
  Filled 2012-05-10: qty 3

## 2012-05-10 MED ORDER — HYDROCODONE-ACETAMINOPHEN 5-325 MG PO TABS: 1.0000 | ORAL_TABLET | ORAL | Status: AC | PRN

## 2012-05-10 MED ORDER — CEPHALEXIN 250 MG PO CAPS
500.0000 mg | ORAL_CAPSULE | Freq: Once | ORAL | Status: AC
  Administered 2012-05-10: 500 mg via ORAL
  Filled 2012-05-10: qty 2

## 2012-05-10 MED ORDER — PREDNISONE 10 MG PO TABS: 60.0000 mg | ORAL_TABLET | Freq: Every day | ORAL | Status: DC

## 2012-05-10 MED ORDER — DIPHENHYDRAMINE HCL 25 MG PO CAPS
25.0000 mg | ORAL_CAPSULE | Freq: Four times a day (QID) | ORAL | Status: DC | PRN
Start: 2012-05-10 — End: 2013-12-16

## 2012-05-10 MED ORDER — ONDANSETRON 4 MG PO TBDP
8.0000 mg | ORAL_TABLET | Freq: Once | ORAL | Status: AC
  Administered 2012-05-10: 8 mg via ORAL
  Filled 2012-05-10: qty 2

## 2012-05-10 MED ORDER — CEPHALEXIN 500 MG PO CAPS: 500.0000 mg | ORAL_CAPSULE | Freq: Four times a day (QID) | ORAL | Status: AC

## 2012-05-10 MED ORDER — FAMOTIDINE 20 MG PO TABS: 20.0000 mg | ORAL_TABLET | Freq: Two times a day (BID) | ORAL | Status: DC

## 2012-05-10 NOTE — ED Provider Notes (Signed)
History     CSN: [id number]  Arrival date & time 05/10/12  [id number]   First MD Initiated Contact with Patient 05/10/12 [phone]      Chief Complaint  Patient presents with  . Flank Pain  . Rash    (Consider location/radiation/quality/duration/timing/severity/associated sxs/prior treatment) The history is provided by the patient.   patient reports developing left-sided flank pain decreased appetite as well as nausea and vomiting over the past 48 hours.  She's had decreased oral intake.  She also reports developing a rash diffusely throughout her body over the past week.  She reports the rash itches and gets better when she uses Benadryl.  She has a long-standing history of what sounds like urticaria associated with stress.  She denies difficulty breathing or swallowing.  She has no shortness of breath.  She has no chest pain.  She's had no lightheadedness or syncope.  She denies a history of ureteral stones.  She reports urinary frequency without dysuria.  She has no new vaginal complaints.  She denies diarrhea.  Her pain is mild to moderate.  Nothing worsens or improves her symptoms  Past Medical History  Diagnosis Date  . History of blood clots   . GERD (gastroesophageal reflux disease)   . Renal calculi   . Hyperparathyroidism   . Hypercalcemia   . Anxiety   . Nephrolithiasis     Past Surgical History  Procedure Date  . Tubal ligation   . 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  History  Substance Use Topics  . Smoking status: Passive Smoker  . Smokeless tobacco: Not on file  . Alcohol Use: No    OB History    Grav Para Term Preterm Abortions TAB SAB Ect Mult Living                  Review of Systems  Genitourinary: Positive for flank pain.  Skin: Positive for rash.  All other systems reviewed and are negative.    Allergies  Macrobid and Sulfa antibiotics  Home Medications   Current Outpatient Rx  Name Route Sig Dispense Refill  .  CITALOPRAM HYDROBROMIDE 20 MG PO TABS Oral Take 20 mg by mouth daily.      Marland Kitchen CLONAZEPAM 0.5 MG PO TABS Oral Take 0.5 mg by mouth 2 (two) times daily as needed.      . OXYCODONE-ACETAMINOPHEN 10-325 MG PO TABS Oral Take 1 tablet by mouth every 4 (four) hours as needed. For pain    . CEPHALEXIN 500 MG PO CAPS Oral Take 1 capsule (500 mg total) by mouth 4 (four) times daily. 20 capsule 0  . DIPHENHYDRAMINE HCL 25 MG PO CAPS Oral Take 1 capsule (25 mg total) by mouth every 6 (six) hours as needed for itching. 20 capsule 0  . FAMOTIDINE 20 MG PO TABS Oral Take 1 tablet (20 mg total) by mouth 2 (two) times daily. 10 tablet 0  . HYDROCODONE-ACETAMINOPHEN 5-325 MG PO TABS Oral Take 1 tablet by mouth every 4 (four) hours as needed for pain. 15 tablet 0    BP 112/44  Pulse 93  Temp 98.2 F (36.8 C) (Oral)  Resp 20  SpO2 100%  Physical Exam  Nursing note and vitals reviewed. Constitutional: She is oriented to person, place, and time. She appears well-developed and well-nourished. No distress.  HENT:  Head: Normocephalic and atraumatic.  Eyes: EOM are normal.  Neck: Normal range of motion.  Cardiovascular: Normal rate,  regular rhythm and normal heart sounds.   Pulmonary/Chest: Effort normal and breath sounds normal.  Abdominal: Soft. She exhibits no distension. There is no tenderness. There is no rebound and no guarding.  Genitourinary:       Mild left CVA tenderness  Musculoskeletal: Normal range of motion.  Neurological: She is alert and oriented to person, place, and time.  Skin: Skin is warm and dry. Rash noted.       Diffuse urticarial rash on her upper extremities chest abdomen back.  No secondary signs of infection  Psychiatric: She has a normal mood and affect. Judgment normal.    ED Course  Procedures (including critical care time)  Labs Reviewed  URINALYSIS, ROUTINE W REFLEX MICROSCOPIC - Abnormal; Notable for the following:    APPearance CLOUDY (*)     Leukocytes, UA MODERATE  (*)     All other components within normal limits  URINE MICROSCOPIC-ADD ON - Abnormal; Notable for the following:    Squamous Epithelial / LPF FEW (*)     Bacteria, UA FEW (*)     All other components within normal limits  PREGNANCY, URINE   No results found.   1. Urticaria   2. Urinary tract infection       MDM  8:53 AM The patient feels much better at this time.  Her rash is resolved with Pepcid and Benadryl and steroids.  The patient be sent home with a short course of steroids Pepcid and Benadryl.  She does appear to have a urinary tract infection, and thus the patient be sent home with prescription for Keflex as well.  She understands return the emergency apartment for new or worsening symptoms        Hoy Morn, MD 05/10/12 [phone]

## 2012-05-10 NOTE — ED Notes (Signed)
sts flank and kidney pain, abd pain, decreased appetite, and vomiting with all po intake and rash noted to arms and chest.

## 2013-12-16 ENCOUNTER — Emergency Department (HOSPITAL_COMMUNITY): Payer: Medicaid Other

## 2013-12-16 ENCOUNTER — Emergency Department (HOSPITAL_COMMUNITY)
Admission: EM | Admit: 2013-12-16 | Discharge: 2013-12-16 | Disposition: A | Discharge status: Home / Self Care | Payer: Medicaid Other | Attending: Emergency Medicine | Admitting: Emergency Medicine

## 2013-12-16 ENCOUNTER — Encounter (HOSPITAL_COMMUNITY): Payer: Self-pay | Admitting: Emergency Medicine

## 2013-12-16 DIAGNOSIS — Z87442 Personal history of urinary calculi: Secondary | ICD-10-CM | POA: Insufficient documentation

## 2013-12-16 DIAGNOSIS — R42 Dizziness and giddiness: Secondary | ICD-10-CM | POA: Insufficient documentation

## 2013-12-16 DIAGNOSIS — R079 Chest pain, unspecified: Secondary | ICD-10-CM | POA: Insufficient documentation

## 2013-12-16 DIAGNOSIS — R000 Tachycardia, unspecified: Secondary | ICD-10-CM | POA: Insufficient documentation

## 2013-12-16 DIAGNOSIS — R109 Unspecified abdominal pain: Secondary | ICD-10-CM

## 2013-12-16 DIAGNOSIS — N898 Other specified noninflammatory disorders of vagina: Secondary | ICD-10-CM | POA: Insufficient documentation

## 2013-12-16 DIAGNOSIS — Z3202 Encounter for pregnancy test, result negative: Secondary | ICD-10-CM | POA: Insufficient documentation

## 2013-12-16 DIAGNOSIS — R197 Diarrhea, unspecified: Secondary | ICD-10-CM | POA: Insufficient documentation

## 2013-12-16 DIAGNOSIS — K760 Fatty (change of) liver, not elsewhere classified: Secondary | ICD-10-CM

## 2013-12-16 DIAGNOSIS — N200 Calculus of kidney: Secondary | ICD-10-CM | POA: Insufficient documentation

## 2013-12-16 DIAGNOSIS — F411 Generalized anxiety disorder: Secondary | ICD-10-CM | POA: Insufficient documentation

## 2013-12-16 DIAGNOSIS — N390 Urinary tract infection, site not specified: Secondary | ICD-10-CM | POA: Insufficient documentation

## 2013-12-16 DIAGNOSIS — R0602 Shortness of breath: Secondary | ICD-10-CM | POA: Insufficient documentation

## 2013-12-16 DIAGNOSIS — K7689 Other specified diseases of liver: Secondary | ICD-10-CM | POA: Insufficient documentation

## 2013-12-16 DIAGNOSIS — R6883 Chills (without fever): Secondary | ICD-10-CM | POA: Insufficient documentation

## 2013-12-16 DIAGNOSIS — R112 Nausea with vomiting, unspecified: Secondary | ICD-10-CM | POA: Insufficient documentation

## 2013-12-16 DIAGNOSIS — K921 Melena: Secondary | ICD-10-CM | POA: Insufficient documentation

## 2013-12-16 DIAGNOSIS — R300 Dysuria: Secondary | ICD-10-CM | POA: Insufficient documentation

## 2013-12-16 LAB — I-STAT CG4 LACTIC ACID, ED
Lactic Acid, Venous: 2.8 mmol/L — ABNORMAL HIGH (ref 0.5–2.2)
Lactic Acid, Venous: 1.44 mmol/L (ref 0.5–2.2)

## 2013-12-16 LAB — BASIC METABOLIC PANEL
BUN: 9 mg/dL (ref 6–23)
CO2: 24 mEq/L (ref 19–32)
Calcium: 9.5 mg/dL (ref 8.4–10.5)
Chloride: 97 mEq/L (ref 96–112)
Creatinine, Ser: 1.12 mg/dL — ABNORMAL HIGH (ref 0.50–1.10)
GFR calc Af Amer: 71 mL/min — ABNORMAL LOW (ref >90)
GFR calc non Af Amer: 61 mL/min — ABNORMAL LOW (ref >90)
Glucose, Bld: 256 mg/dL — ABNORMAL HIGH (ref 70–99)
Potassium: 3.7 mEq/L (ref 3.7–5.3)
Sodium: 137 mEq/L (ref 137–147)

## 2013-12-16 LAB — CBC WITH DIFFERENTIAL/PLATELET
Basophils Absolute: 0.1 10*3/uL (ref 0.0–0.1)
Basophils Relative: 0 % (ref 0–1)
Eosinophils Absolute: 0.2 10*3/uL (ref 0.0–0.7)
Eosinophils Relative: 1 % (ref 0–5)
HCT: 44.6 % (ref 36.0–46.0)
Hemoglobin: 15.2 g/dL — ABNORMAL HIGH (ref 12.0–15.0)
Lymphocytes Relative: 22 % (ref 12–46)
Lymphs Abs: 4 10*3/uL (ref 0.7–4.0)
MCH: 31.3 pg (ref 26.0–34.0)
MCHC: 34.1 g/dL (ref 30.0–36.0)
MCV: 91.8 fL (ref 78.0–100.0)
Monocytes Absolute: 0.9 10*3/uL (ref 0.1–1.0)
Monocytes Relative: 5 % (ref 3–12)
Neutro Abs: 12.9 10*3/uL — ABNORMAL HIGH (ref 1.7–7.7)
Neutrophils Relative %: 71 % (ref 43–77)
Platelets: 312 10*3/uL (ref 150–400)
RBC: 4.86 MIL/uL (ref 3.87–5.11)
RDW: 13 % (ref 11.5–15.5)
WBC: 18.1 10*3/uL — ABNORMAL HIGH (ref 4.0–10.5)

## 2013-12-16 LAB — URINALYSIS, ROUTINE W REFLEX MICROSCOPIC
Bilirubin Urine: NEGATIVE
Glucose, UA: >1000 mg/dL — AB
Ketones, ur: NEGATIVE mg/dL
Nitrite: POSITIVE — AB
Protein, ur: NEGATIVE mg/dL
Specific Gravity, Urine: 1.023 (ref 1.005–1.030)
Urobilinogen, UA: 0.2 mg/dL (ref 0.0–1.0)
pH: 6.5 (ref 5.0–8.0)

## 2013-12-16 LAB — COMPREHENSIVE METABOLIC PANEL
ALT: 11 U/L (ref 0–35)
AST: 13 U/L (ref 0–37)
Albumin: 3.5 g/dL (ref 3.5–5.2)
Alkaline Phosphatase: 120 U/L — ABNORMAL HIGH (ref 39–117)
BUN: 10 mg/dL (ref 6–23)
CO2: 23 mEq/L (ref 19–32)
Calcium: 9.8 mg/dL (ref 8.4–10.5)
Chloride: 93 mEq/L — ABNORMAL LOW (ref 96–112)
Creatinine, Ser: 1.15 mg/dL — ABNORMAL HIGH (ref 0.50–1.10)
GFR calc Af Amer: 69 mL/min — ABNORMAL LOW (ref >90)
GFR calc non Af Amer: 60 mL/min — ABNORMAL LOW (ref >90)
Glucose, Bld: 380 mg/dL — ABNORMAL HIGH (ref 70–99)
Potassium: 3.8 mEq/L (ref 3.7–5.3)
Sodium: 133 mEq/L — ABNORMAL LOW (ref 137–147)
Total Bilirubin: 0.3 mg/dL (ref 0.3–1.2)
Total Protein: 7.9 g/dL (ref 6.0–8.3)

## 2013-12-16 LAB — WET PREP, GENITAL
Clue Cells Wet Prep HPF POC: NONE SEEN
Trich, Wet Prep: NONE SEEN
Yeast Wet Prep HPF POC: NONE SEEN

## 2013-12-16 LAB — LIPASE, BLOOD: Lipase: 76 U/L — ABNORMAL HIGH (ref 11–59)

## 2013-12-16 LAB — URINE MICROSCOPIC-ADD ON

## 2013-12-16 LAB — PREGNANCY, URINE: Preg Test, Ur: NEGATIVE

## 2013-12-16 LAB — TROPONIN I
Troponin I: <0.3 ng/mL (ref <0.30)
Troponin I: <0.3 ng/mL (ref <0.30)

## 2013-12-16 LAB — POC OCCULT BLOOD, ED: Fecal Occult Bld: NEGATIVE

## 2013-12-16 MED ORDER — HYDROMORPHONE HCL PF 1 MG/ML IJ SOLN
0.5000 mg | Freq: Once | INTRAMUSCULAR | Status: AC
  Administered 2013-12-16: 0.5 mg via INTRAVENOUS
  Filled 2013-12-16: qty 1

## 2013-12-16 MED ORDER — ONDANSETRON HCL 4 MG/2ML IJ SOLN
4.0000 mg | Freq: Once | INTRAMUSCULAR | Status: AC
  Administered 2013-12-16: 4 mg via INTRAVENOUS
  Filled 2013-12-16: qty 2

## 2013-12-16 MED ORDER — PROMETHAZINE HCL 25 MG/ML IJ SOLN
25.0000 mg | Freq: Once | INTRAMUSCULAR | Status: AC
  Administered 2013-12-16: 25 mg via INTRAVENOUS
  Filled 2013-12-16: qty 1

## 2013-12-16 MED ORDER — CEPHALEXIN 500 MG PO CAPS: 500.0000 mg | ORAL_CAPSULE | Freq: Three times a day (TID) | ORAL | Status: DC

## 2013-12-16 MED ORDER — CEPHALEXIN 500 MG PO CAPS
500.0000 mg | ORAL_CAPSULE | Freq: Once | ORAL | Status: AC
  Administered 2013-12-16: 500 mg via ORAL
  Filled 2013-12-16: qty 1

## 2013-12-16 MED ORDER — ONDANSETRON HCL 4 MG/2ML IJ SOLN
4.0000 mg | Freq: Once | INTRAMUSCULAR | Status: DC
  Filled 2013-12-16: qty 2

## 2013-12-16 MED ORDER — HYDROCODONE-ACETAMINOPHEN 5-325 MG PO TABS: 1.0000 | ORAL_TABLET | Freq: Four times a day (QID) | ORAL | Status: DC | PRN

## 2013-12-16 MED ORDER — SODIUM CHLORIDE 0.9 % IV BOLUS (SEPSIS)
1000.0000 mL | Freq: Once | INTRAVENOUS | Status: AC
  Administered 2013-12-16: 1000 mL via INTRAVENOUS

## 2013-12-16 MED ORDER — ONDANSETRON HCL 4 MG PO TABS: 4.0000 mg | ORAL_TABLET | Freq: Four times a day (QID) | ORAL | Status: DC

## 2013-12-16 NOTE — ED Notes (Signed)
She states she has frequent, intermittent upper abd. Pain and occasional n/v "for weeks now".  She also states "I think I might also have a yeast infection.

## 2013-12-16 NOTE — Discharge Instructions (Signed)
Please call and set-up an appointment with Health and [place] to be assessed and get HbA1c for possible diabetes Please call and set-up an appointment with General Surgery and GI regarding findings on CT of the liver and lymph nodes - will need to be monitored Please call and set-up an appointment with Urology regarding kidney stones Please get liver enzymes and kidney levels re-checked - Creatinine was mildly elevated and will need to be rechecked.  Please take antibiotics as prescribed and on a full stomach Please take pain medications as prescribed and while on pain meds there is to be no drinking alcohol, driving, operating any heavy machinery - of extra please dispose in a proper manner. Please do not take any Tylenol with this medication for this can lead to Tylenol overdose and liver failure. Please rest and stay hydrated Please continue to monitor symptoms closely and if symptoms are to worsen or change (fever greater than 101, chills, chest pain, shortness of breath, difficulty breathing, worsening or changes to abdominal pain, inability to keep food or fluids down, blood in stools, black tarry stools, changes to color of the skin) please report back to the ED    Abdominal Pain, Adult Many things can cause abdominal pain. Usually, abdominal pain is not caused by a disease and will improve without treatment. It can often be observed and treated at home. Your health care provider will do a physical exam and possibly order blood tests and X-rays to help determine the seriousness of your pain. However, in many cases, more time must pass before a clear cause of the pain can be found. Before that point, your health care provider may not know if you need more testing or further treatment. HOME CARE INSTRUCTIONS  Monitor your abdominal pain for any changes. The following actions may help to alleviate any discomfort you are experiencing:  Only take over-the-counter or prescription medicines as  directed by your health care provider.  Do not take laxatives unless directed to do so by your health care provider.  Try a clear liquid diet (broth, tea, or water) as directed by your health care provider. Slowly move to a bland diet as tolerated. SEEK MEDICAL CARE IF:  You have unexplained abdominal pain.  You have abdominal pain associated with nausea or diarrhea.  You have pain when you urinate or have a bowel movement.  You experience abdominal pain that wakes you in the night.  You have abdominal pain that is worsened or improved by eating food.  You have abdominal pain that is worsened with eating fatty foods. SEEK IMMEDIATE MEDICAL CARE IF:   Your pain does not go away within 2 hours.  You have a fever.  You keep throwing up (vomiting).  Your pain is felt only in portions of the abdomen, such as the right side or the left lower portion of the abdomen.  You pass bloody or black tarry stools. MAKE SURE YOU:  Understand these instructions.   Will watch your condition.   Will get help right away if you are not doing well or get worse.  Document Released: 06/30/2005 Document Revised: 07/11/2013 Document Reviewed: 05/30/2013 [facility] Patient Information 2014 [place]. Kidney Stones Kidney stones (urolithiasis) are deposits that form inside your kidneys. The intense pain is caused by the stone moving through the urinary tract. When the stone moves, the ureter goes into spasm around the stone. The stone is usually passed in the urine.  CAUSES   A disorder that makes certain  neck glands produce too much parathyroid hormone (primary hyperparathyroidism).  A buildup of uric acid crystals, similar to gout in your joints.  Narrowing (stricture) of the ureter.  A kidney obstruction present at birth (congenital obstruction).  Previous surgery on the kidney or ureters.  Numerous kidney infections. SYMPTOMS   Feeling sick to your stomach (nauseous).  Throwing  up (vomiting).  Blood in the urine (hematuria).  Pain that usually spreads (radiates) to the groin.  Frequency or urgency of urination. DIAGNOSIS   Taking a history and physical exam.  Blood or urine tests.  CT scan.  Occasionally, an examination of the inside of the urinary bladder (cystoscopy) is performed. TREATMENT   Observation.  Increasing your fluid intake.  Extracorporeal shock wave lithotripsy This is a noninvasive procedure that uses shock waves to break up kidney stones.  Surgery may be needed if you have severe pain or persistent obstruction. There are various surgical procedures. Most of the procedures are performed with the use of small instruments. Only small incisions are needed to accommodate these instruments, so recovery time is minimized. The size, location, and chemical composition are all important variables that will determine the proper choice of action for you. Talk to your health care provider to better understand your situation so that you will minimize the risk of injury to yourself and your kidney.  HOME CARE INSTRUCTIONS   Drink enough water and fluids to keep your urine clear or pale yellow. This will help you to pass the stone or stone fragments.  Strain all urine through the provided strainer. Keep all particulate matter and stones for your health care provider to see. The stone causing the pain may be as small as a grain of salt. It is very important to use the strainer each and every time you pass your urine. The collection of your stone will allow your health care provider to analyze it and verify that a stone has actually passed. The stone analysis will often identify what you can do to reduce the incidence of recurrences.  Only take over-the-counter or prescription medicines for pain, discomfort, or fever as directed by your health care provider.  Make a follow-up appointment with your health care provider as directed.  Get follow-up X-rays if  required. The absence of pain does not always mean that the stone has passed. It may have only stopped moving. If the urine remains completely obstructed, it can cause loss of kidney function or even complete destruction of the kidney. It is your responsibility to make sure X-rays and follow-ups are completed. Ultrasounds of the kidney can show blockages and the status of the kidney. Ultrasounds are not associated with any radiation and can be performed easily in a matter of minutes. SEEK MEDICAL CARE IF:  You experience pain that is progressive and unresponsive to any pain medicine you have been prescribed. SEEK IMMEDIATE MEDICAL CARE IF:   Pain cannot be controlled with the prescribed medicine.  You have a fever or shaking chills.  The severity or intensity of pain increases over 18 hours and is not relieved by pain medicine.  You develop a new onset of abdominal pain.  You feel faint or pass out.  You are unable to urinate. MAKE SURE YOU:   Understand these instructions.  Will watch your condition.  Will get help right away if you are not doing well or get worse. Document Released: 09/20/2005 Document Revised: 05/23/2013 Document Reviewed: 02/21/2013 [facility] Patient Information 2014 [place]. Urinary Tract Infection  Urinary tract infections (UTIs) can develop anywhere along your urinary tract. Your urinary tract is your body's drainage system for removing wastes and extra water. Your urinary tract includes two kidneys, two ureters, a bladder, and a urethra. Your kidneys are a pair of bean-shaped organs. Each kidney is about the size of your fist. They are located below your ribs, one on each side of your spine. CAUSES Infections are caused by microbes, which are microscopic organisms, including fungi, viruses, and bacteria. These organisms are so small that they can only be seen through a microscope. Bacteria are the microbes that most commonly cause UTIs. SYMPTOMS  Symptoms  of UTIs may vary by age and gender of the patient and by the location of the infection. Symptoms in young women typically include a frequent and intense urge to urinate and a painful, burning feeling in the bladder or urethra during urination. Older women and men are more likely to be tired, shaky, and weak and have muscle aches and abdominal pain. A fever may mean the infection is in your kidneys. Other symptoms of a kidney infection include pain in your back or sides below the ribs, nausea, and vomiting. DIAGNOSIS To diagnose a UTI, your caregiver will ask you about your symptoms. Your caregiver also will ask to provide a urine sample. The urine sample will be tested for bacteria and white blood cells. White blood cells are made by your body to help fight infection. TREATMENT  Typically, UTIs can be treated with medication. Because most UTIs are caused by a bacterial infection, they usually can be treated with the use of antibiotics. The choice of antibiotic and length of treatment depend on your symptoms and the type of bacteria causing your infection. HOME CARE INSTRUCTIONS  If you were prescribed antibiotics, take them exactly as your caregiver instructs you. Finish the medication even if you feel better after you have only taken some of the medication.  Drink enough water and fluids to keep your urine clear or pale yellow.  Avoid caffeine, tea, and carbonated beverages. They tend to irritate your bladder.  Empty your bladder often. Avoid holding urine for long periods of time.  Empty your bladder before and after sexual intercourse.  After a bowel movement, women should cleanse from front to back. Use each tissue only once. SEEK MEDICAL CARE IF:   You have back pain.  You develop a fever.  Your symptoms do not begin to resolve within 3 days. SEEK IMMEDIATE MEDICAL CARE IF:   You have severe back pain or lower abdominal pain.  You develop chills.  You have nausea or  vomiting.  You have continued burning or discomfort with urination. MAKE SURE YOU:   Understand these instructions.  Will watch your condition.  Will get help right away if you are not doing well or get worse. Document Released: 06/30/2005 Document Revised: 03/21/2012 Document Reviewed: 10/29/2011 [facility] Patient Information 2014 [place].   Emergency Department Resource Guide 1) Find a Doctor and Pay Out of Pocket Although you won't have to find out who is covered by your insurance plan, it is a good idea to ask around and get recommendations. You will then need to call the office and see if the doctor you have chosen will accept you as a new patient and what types of options they offer for patients who are self-pay. Some doctors offer discounts or will set up payment plans for their patients who do not have insurance, but you will need to  ask so you aren't surprised when you get to your appointment.  2) Contact Your Local Health Department Not all health departments have doctors that can see patients for sick visits, but many do, so it is worth a call to see if yours does. If you don't know where your local health department is, you can check in your phone book. The CDC also has a tool to help you locate your state's health department, and many state websites also have listings of all of their local health departments.  3) Find a [facility] If your illness is not likely to be very severe or complicated, you may want to try a walk in clinic. These are popping up all over the country in pharmacies, drugstores, and shopping centers. They're usually staffed by nurse practitioners or physician assistants that have been trained to treat common illnesses and complaints. They're usually fairly quick and inexpensive. However, if you have serious medical issues or chronic medical problems, these are probably not your best option.  No Primary Care Doctor: - Call Health Connect at   [phone] - they can help you locate a primary care doctor that  accepts your insurance, provides certain services, etc. - Physician Referral Service- [phone]  Chronic Pain Problems: Organization         Address  Phone   Notes  [facility]  [phone] Patients need to be referred by their primary care doctor.   Medication Assistance: Organization         Address  Phone   Notes  [facility] Medication [facility] [address] [place], [place] [address] [phone] --Must be a resident of [facility] -- Must have NO insurance coverage whatsoever (no Medicaid/ Medicare, etc.) -- The pt. MUST have a primary care doctor that directs their care regularly and follows them in the community   [facility]ist  [phone]   [employer]  [phone]    Agencies that provide inexpensive medical care: Organization         Address  Phone   Notes  [place]  [phone]   [facility]    [phone]   [facility] [place],  [phone]   [place] [address], [place] [phone]   Planned Parenthood    [phone]   [facility]    [phone]   [place] and [place] [address], [place] Phone:  [phone], Fax:  [phone] Hours of Operation:  9 am - 6 pm, M-F.  Also accepts Medicaid/Medicare and self-pay.  [facility] for [place], [address], [place] Phone: [phone], Fax: [phone]. Hours of Operation:  8:30 am - 5:30 pm, M-F.  Also accepts Medicaid and self-pay.  [facility] [place] [address] [place] Phone: [phone]   [place], [place], [place] [phone], Ext. [phone] Mondays & Thursdays: 7-9 AM.  First 15 patients are seen on a first come, first serve basis.     [place] Providers:  Organization         Address  Phone   Notes  [facility] [address], [place] [phone] Also accepts self-pay patients.  [facility] [id number] [place], [place]  [phone])  [place], [address], [place] [phone]   [place] [address], [place] [phone]   Lucianne Lei [address], [place]   [phone] Only accepts [place] Access [place] patients after they have their name applied to their card.   Self-Pay (no insurance) in [facility]:  Organization         Address  Phone   Notes  Sickle Cell Patients, [facility] Internal Medicine [place] [phone]   [facility] [place] [phone]   [facility] [place]  [place], [place], [place] [phone]   [facility]/Dr. Osei-Bonsu  [address], [place] or [address], [place] [phone] Phone number for both [place] and [place] locations is the same.  Urgent Medical and [facility] [address], [place] [phone]   [facility] [address] [place] or [address] [phone]   [facility] [address] [place] [phone], phone; [phone], fax Sees patients 1st and 3rd Saturday of every month.  Must not qualify for public or private insurance (i.e. Medicaid, Medicare, [place] Health Choice, Veterans' Benefits)  Household income should be no more than 200% of the poverty level The clinic cannot treat you if you are pregnant or think you are pregnant  Sexually transmitted diseases are not treated at the clinic.    Dental Care: Organization         Address  Phone  Notes  [facility]  Department of [facility] [place] [phone] Accepts children up to age 34 who are enrolled in [place] or [place]; pregnant women with a Medicaid card; and children who have applied for Medicaid or [place] Health Choice, but were declined, whose parents can pay a reduced fee at time of service.  [facility] Department of [facility]  [address], [place] [phone] Accepts children up to age 61 who are enrolled in [place] or [place]; pregnant women with a Medicaid card; and children who have applied for Medicaid or [place] Health Choice, but were declined, whose parents can pay a reduced fee at time of service.  [place] Adult Dental Access PROGRAM  [place] [phone] Patients are seen by appointment only. Walk-ins are not accepted. [place] will see patients 30 years of age and older. Monday - Tuesday (8am-5pm) Most Wednesdays (8:30-5pm) $30 per visit, cash only  [facility] Adult Dental Access PROGRAM  [address], [facility] [phone] Patients are seen by appointment only. Walk-ins are not accepted. [place] will see patients 78 years of age and older. One Wednesday Evening (Monthly: Volunteer Based).  $30 per visit, cash only  [place]  [phone] for adults; Children under age 58, call Graduate Pediatric Dentistry at [phone]. Children aged 77-14, please call [phone] to request a pediatric application.  Dental services are provided in all areas of dental care including fillings, crowns and bridges, complete and partial dentures, implants, gum treatment, root canals, and extractions. Preventive care is also provided. Treatment is provided to both adults and children. Patients are selected via a lottery and there is often a  waiting list.   [facility] [address] [place]  [phone]  [email or web address]   [facility] [address] [place], [place] [phone], Ext. [phone] Second and Fourth Thursday of each month, opens at 6:30 AM; Clinic ends at 9 AM.  Patients are seen on a first-come first-served basis, and a limited number are seen during each clinic.   [facility]  [address] Hillard Danker [place], [place] [phone]   Eligibility Requirements You must have lived in [place], [place], or [place] counties for at least the last three months.   You cannot be eligible for state or federal sponsored [employer], including [employer], [place], or [employer].   You generally cannot be eligible for healthcare insurance through your employer.    How to apply: Eligibility screenings are held every Tuesday and Wednesday afternoon from 1:00 pm until 4:00 pm. You do not need an appointment for the interview!  [facility] [address], [place], [place]   [place]  [place] Department  [place]  [phone]    Behavioral Health Resources in the Community: Intensive Outpatient Programs Organization         Address  Phone  Notes  [place]. [address], [place], [place] [phone]   [facility] Outpatient [address], [place], [place]   ADS: Alcohol [facility] [address], [place], [place]   [place] [address]  [place], [place] or [phone]   Substance Abuse Resources Organization         Address  Phone  Notes  Alcohol and Drug Services  [phone]   [place]  [phone]   [facility] [place]   Chinita Pester  [phone]   Residential & Outpatient Substance Abuse Program  [phone]   Psychological Services Organization          Address  Phone  Notes  [facility] [place]  [place]  [phone]   [place] [address], [place] or [phone]    Mobile Crisis Teams Organization         Address  Phone  Notes  [facility], Mobile Crisis Care Unit  [phone]   [facility]  [address] [place], [place]   [place] Levels [address] [place] [phone]    Self-Help/Support Groups Organization         Address  Phone             Notes  [place]. of [place] - variety of support groups  [place] Call for more information  Narcotics Anonymous (NA), [facility] [address], [employer] [place]  2 meetings at this location   Special educational needs teacher         Address  Phone  Notes  ASAP Residential Treatment [place],    [place]  [phone]   [facility]  [address] [place] [id number], [place], [place]   [place], [place] [phone] Admissions: 8am-3pm M-F  Incentives Substance [place] [address]    [place], [place]   [facility] [address] [place], [place], [place]   The  [facility] [address]  [place], [place]   [facility] - Intensive Outpatient [address] Kristeen [address], [place], [place]   [facility] ([place].) [place].,  [place], [place] [phone] or [phone]   Residential Treatment Services (RTS) [address] [place], [place] Accepts Medicaid  [facility] [place] [address],  [place] [phone] Substance Abuse/Addiction Treatment   [facility] Organization         Address  Phone  Notes  [facility]  [phone]   Domenic Schwab, PhD [address] Arlis Porta [place], [place]   [phone] or [phone]   [place], [place] [phone]   [facility] [address], [place], [place] [phone] Insurance/Medicaid/sponsorship through [facility] and Families [address] [place]                                    [place], [place] [phone] [place] [address], [place] [phone]    Dr. Adele Schilder  [phone]   [facility] Dept. 1) [address] [place] 2) [place] 3)  [place] [address], [place] [phone]  [phone]   [place] [phone] or [phone] (After Hours)

## 2013-12-16 NOTE — ED Provider Notes (Signed)
CSN: [id number]     Arrival date & time 12/16/13  1558 History   First MD Initiated Contact with Patient 12/16/13 1607     Chief Complaint  Patient presents with  . Abdominal Pain     (Consider location/radiation/quality/duration/timing/severity/associated sxs/prior Treatment) The history is provided by the patient. No language interpreter was used.  Terri Sparks is a 38 y/o F with PMHx of GERD, blood clots, renal calculi, hyperparathyroidism with removal of parathyroid adenoma, anxiety presenting to the ED with abdominal pain that has been ongoing for the past 3-4 weeks. Stated that the pain is localized to the epigastric and right upper quadrant described as a shooting pain that intermittently radiates to the center of her chest with increased pain leading to shortness of breathe - reported that this pain worsens with eating food, but is alleviated by applying pressure to the RUQ. Patient reported that she has been feeling nauseous and stated that she has been having emesis over the course of 3-4 weeks - stated that she has not been able to keep anything down. Stated that she has been experiencing diarrhea and stated that her diarrhea is yellowish in color with mucus, stated that she saw black tarry stools once. Stated that she used to follow a GI physician, but has not seen a GI physician within 10 years. Reported right sided flank pain that has now become bilateral described as a pressure sensation that is constant without radiation. Reported that she has had many surgery regarding her kidney stones and stated that she cannot deal with the pain anymore - stated that she used to follow Dr. Felipa Eth regarding her stones, but stated that she has not seen this physician in approximately 6-7 years. Stated that she has been having vaginal discomfort and itching for the past week - stated that she has been having burning with each urination. Stated that she has been using over the counter cream with  minimal relief. Stated that she has been having chills intermittently. Denied fever, neck pain, neck stiffness, dizziness, hematuria. PCP Dr. Dennard Schaumann Urologist Dr. Felipa Eth  Past Medical History  Diagnosis Date  . History of blood clots   . GERD (gastroesophageal reflux disease)   . Renal calculi   . Hyperparathyroidism   . Hypercalcemia   . Anxiety   . Nephrolithiasis    Past Surgical History  Procedure Laterality Date  . Tubal ligation    . 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 History  Substance Use Topics  . Smoking status: Passive Smoke Exposure - Never Smoker  . Smokeless tobacco: Not on file  . Alcohol Use: No   OB History   Grav Para Term Preterm Abortions TAB SAB Ect Mult Living                 Review of Systems  Constitutional: Positive for chills. Negative for fever.  HENT: Negative for trouble swallowing.   Respiratory: Negative for chest tightness and shortness of breath.   Cardiovascular: Positive for chest pain.  Gastrointestinal: Positive for nausea, vomiting, abdominal pain, diarrhea and blood in stool. Negative for constipation and anal bleeding.  Genitourinary: Positive for dysuria, flank pain (left ) and vaginal discharge. Negative for vaginal bleeding, vaginal pain and pelvic pain.  Musculoskeletal: Negative for back pain and neck pain.  Neurological: Positive for light-headedness. Negative for dizziness and weakness.  All other systems reviewed and are negative.      Allergies  Macrobid and Sulfa  antibiotics  Home Medications   Current Outpatient Rx  Name  Route  Sig  Dispense  Refill  . acetaminophen (TYLENOL) 500 MG tablet   Oral   Take 3,000 mg by mouth once.         Marland Kitchen ibuprofen (ADVIL,MOTRIN) 200 MG tablet   Oral   Take 800 mg by mouth every 6 (six) hours as needed for moderate pain.         . cephALEXin (KEFLEX) 500 MG capsule   Oral   Take 1 capsule (500 mg total) by mouth 3 (three) times  daily.   30 capsule   0   . HYDROcodone-acetaminophen (NORCO/VICODIN) 5-325 MG per tablet   Oral   Take 1 tablet by mouth every 6 (six) hours as needed.   5 tablet   0   . ondansetron (ZOFRAN) 4 MG tablet   Oral   Take 1 tablet (4 mg total) by mouth every 6 (six) hours.   12 tablet   0    BP 132/80  Pulse 99  Temp(Src) 98.9 F (37.2 C) (Oral)  Resp 20  Ht 5\' 7"  (1.702 m)  Wt 280 lb (127.007 kg)  BMI 43.84 kg/m2  SpO2 93%  LMP 12/02/2013 Physical Exam  Nursing note and vitals reviewed. Constitutional: She is oriented to person, place, and time. She appears well-developed and well-nourished. No distress.  HENT:  Head: Normocephalic and atraumatic.  Mouth/Throat: Oropharynx is clear and moist. No oropharyngeal exudate.  Eyes: Conjunctivae and EOM are normal. Pupils are equal, round, and reactive to light. Right eye exhibits no discharge. Left eye exhibits no discharge.  Neck: Normal range of motion. Neck supple.  Cardiovascular: Regular rhythm and normal heart sounds.  Exam reveals no friction rub.   No murmur heard. Mild tachycardia upon auscultation   Pulmonary/Chest: Effort normal and breath sounds normal. No respiratory distress. She has no wheezes. She has no rales. She exhibits no tenderness.  Abdominal: Soft. Bowel sounds are normal. There is tenderness in the right upper quadrant, epigastric area and suprapubic area. There is CVA tenderness (left sided) and positive Murphy's sign. There is no guarding and no tenderness at McBurney's point.    Obese Discomfort upon palpation to the epigastric region, RUQ and suprapubic region Positive Murphy's sign  Positive left CVA tenderness noted   Genitourinary: Guaiac negative stool.  Rectal Exam: Negative swelling, erythema, inflammation, lesions, sores noted to the anus. Negative masses palpated. Good sphincter tone. Negative blood on glove. Light brown, yellowish stools noted. Chaperoned with RN  Pelvic exam: negative  swelling, erythema, inflammation, lesions, sores noted to the external genitalia. Negative swelling, erythema, inflammation, lesions, sores, masses noted to the vaginal canal. Negative blood in the vaginal vault. Thick white discharge noted - suspicion to be yeast. Negative CMT. Mild suprapubic discomfort upon palpation. Negative bilateral adnexal tenderness.     Musculoskeletal: Normal range of motion.  Full ROM to upper and lower extremities without difficulty noted, negative ataxia noted.  Lymphadenopathy:    She has no cervical adenopathy.  Neurological: She is alert and oriented to person, place, and time. No cranial nerve deficit. She exhibits normal muscle tone. Coordination normal.  Skin: Skin is warm and dry. No rash noted. She is not diaphoretic. No erythema.  Psychiatric: She has a normal mood and affect. Her behavior is normal. Thought content normal.    ED Course  Procedures (including critical care time)  6:28 PM Discussed case with attending who recommended CT scan without contrast.  Patient seen and assessed by attending physician.   11:01 PM This provider discussed labs and imaging in great detail with patient. Patient understood and agreed to plan.   Results for orders placed during the hospital encounter of 12/16/13  WET PREP, GENITAL      Result Value Ref Range   Yeast Wet Prep HPF POC NONE SEEN  NONE SEEN   Trich, Wet Prep NONE SEEN  NONE SEEN   Clue Cells Wet Prep HPF POC NONE SEEN  NONE SEEN   WBC, Wet Prep HPF POC FEW (*) NONE SEEN  CBC WITH DIFFERENTIAL      Result Value Ref Range   WBC 18.1 (*) 4.0 - 10.5 K/uL   RBC 4.86  3.87 - 5.11 MIL/uL   Hemoglobin 15.2 (*) 12.0 - 15.0 g/dL   HCT 44.6  36.0 - 46.0 %   MCV 91.8  78.0 - 100.0 fL   MCH 31.3  26.0 - 34.0 pg   MCHC 34.1  30.0 - 36.0 g/dL   RDW 13.0  11.5 - 15.5 %   Platelets 312  150 - 400 K/uL   Neutrophils Relative % 71  43 - 77 %   Neutro Abs 12.9 (*) 1.7 - 7.7 K/uL   Lymphocytes Relative 22  12 -  46 %   Lymphs Abs 4.0  0.7 - 4.0 K/uL   Monocytes Relative 5  3 - 12 %   Monocytes Absolute 0.9  0.1 - 1.0 K/uL   Eosinophils Relative 1  0 - 5 %   Eosinophils Absolute 0.2  0.0 - 0.7 K/uL   Basophils Relative 0  0 - 1 %   Basophils Absolute 0.1  0.0 - 0.1 K/uL  COMPREHENSIVE METABOLIC PANEL      Result Value Ref Range   Sodium 133 (*) 137 - 147 mEq/L   Potassium 3.8  3.7 - 5.3 mEq/L   Chloride 93 (*) 96 - 112 mEq/L   CO2 23  19 - 32 mEq/L   Glucose, Bld 380 (*) 70 - 99 mg/dL   BUN 10  6 - 23 mg/dL   Creatinine, Ser 1.15 (*) 0.50 - 1.10 mg/dL   Calcium 9.8  8.4 - 10.5 mg/dL   Total Protein 7.9  6.0 - 8.3 g/dL   Albumin 3.5  3.5 - 5.2 g/dL   AST 13  0 - 37 U/L   ALT 11  0 - 35 U/L   Alkaline Phosphatase 120 (*) 39 - 117 U/L   Total Bilirubin 0.3  0.3 - 1.2 mg/dL   GFR calc non Af Amer 60 (*) >90 mL/min   GFR calc Af Amer 69 (*) >90 mL/min  LIPASE, BLOOD      Result Value Ref Range   Lipase 76 (*) 11 - 59 U/L  URINALYSIS, ROUTINE W REFLEX MICROSCOPIC      Result Value Ref Range   Color, Urine YELLOW  YELLOW   APPearance CLOUDY (*) CLEAR   Specific Gravity, Urine 1.023  1.005 - 1.030   pH 6.5  5.0 - 8.0   Glucose, UA >1000 (*) NEGATIVE mg/dL   Hgb urine dipstick MODERATE (*) NEGATIVE   Bilirubin Urine NEGATIVE  NEGATIVE   Ketones, ur NEGATIVE  NEGATIVE mg/dL   Protein, ur NEGATIVE  NEGATIVE mg/dL   Urobilinogen, UA 0.2  0.0 - 1.0 mg/dL   Nitrite POSITIVE (*) NEGATIVE   Leukocytes, UA MODERATE (*) NEGATIVE  PREGNANCY, URINE      Result Value Ref  Range   Preg Test, Ur NEGATIVE  NEGATIVE  TROPONIN I      Result Value Ref Range   Troponin I <0.30  <0.30 ng/mL  URINE MICROSCOPIC-ADD ON      Result Value Ref Range   Squamous Epithelial / LPF RARE  RARE   WBC, UA 21-50  <3 WBC/hpf   RBC / HPF 3-6  <3 RBC/hpf   Bacteria, UA MANY (*) RARE  TROPONIN I      Result Value Ref Range   Troponin I <0.30  <0.30 ng/mL  BASIC METABOLIC PANEL      Result Value Ref Range   Sodium  137  137 - 147 mEq/L   Potassium 3.7  3.7 - 5.3 mEq/L   Chloride 97  96 - 112 mEq/L   CO2 24  19 - 32 mEq/L   Glucose, Bld 256 (*) 70 - 99 mg/dL   BUN 9  6 - 23 mg/dL   Creatinine, Ser 1.12 (*) 0.50 - 1.10 mg/dL   Calcium 9.5  8.4 - 10.5 mg/dL   GFR calc non Af Amer 61 (*) >90 mL/min   GFR calc Af Amer 71 (*) >90 mL/min  POC OCCULT BLOOD, ED      Result Value Ref Range   Fecal Occult Bld NEGATIVE  NEGATIVE  I-STAT CG4 LACTIC ACID, ED      Result Value Ref Range   Lactic Acid, Venous 2.80 (*) 0.5 - 2.2 mmol/L  I-STAT CG4 LACTIC ACID, ED      Result Value Ref Range   Lactic Acid, Venous 1.44  0.5 - 2.2 mmol/L   Ct Abdomen Pelvis Wo Contrast  12/16/2013   CLINICAL DATA:  Pain.  EXAM: CT ABDOMEN AND PELVIS WITHOUT CONTRAST  TECHNIQUE: Multidetector CT imaging of the abdomen and pelvis was performed following the standard protocol without intravenous contrast.  COMPARISON:  CT ABD/PELV WO CM dated 12/19/2010  FINDINGS: Ill-defined lucencies are noted in the right and left lobes of the liver. Although these changes could be related to fatty infiltration, a significant hepatic lesion cannot be excluded and MRI of the abdomen suggests for further evaluation. Spleen is normal. Pancreas is normal. Gallbladder is nondistended. No biliary distention.  Tiny fatty lesion is noted in the left adrenal consistent with a a adrenal myelolipoma. Extensive bilateral nonobstructing nephrolithiasis is present. Bladder is nondistended. Uterus and adnexa are stable. Rounded soft tissue density noted along the right posterior aspect uterus is most likely ovary, this is unchanged in appearance. No free pelvic fluid.  Multiple retroperitoneal lymph nodes are noted. The largest measures 1.3 cm, image number 37/series 2. Aorta normal caliber.  Appendix normal. No inflammatory changes in right or left lower quadrant. No bowel distention. Stool is present throughout the colon. No free air. No mesenteric masses noted. Tiny  umbilical hernia with herniation of fat only noted.  Mild infiltrate with slight nodularity right middle lobe . Heart size normal. No acute bony abnormality.  IMPRESSION: 1. Ill-defined prominent lucencies of the right and left lobes of the liver. Although steatosis could present in this fashion, significant hepatic lesions cannot be excluded and MRI of the abdomen suggest for further evaluation. 2. Multiple retroperitoneal lymph nodes are present. The largest measures 1.3 cm. Further evaluation with PET-CT could be obtained. 3. Nonobstructive bilateral nephrolithiasis. 4. Left adrenal myelolipoma.   Electronically Signed   By: [place]   On: 12/16/2013 20:00   US Abdomen Limited Ruq  12/16/2013   CLINICAL DATA:  Upper abdominal pain  EXAM: US ABDOMEN LIMITED - RIGHT UPPER QUADRANT  COMPARISON:  CT abdomen and pelvis December 16, 2013  FINDINGS: Gallbladder:  No gallstones or wall thickening visualized. There is no pericholecystic fluid. The patient is tender over the gallbladder.  Common bile duct:  Only a small portion of the common bile duct is visualized. This visualized portion measures 4 mm. No segments of dilated biliary duct seen.  Liver:  The liver shows marked increased echogenicity. No focal liver lesions are identified.  IMPRESSION: The liver shows marked increased echogenicity. No focal liver lesions are identified. It must be noted that the sensitivity of ultrasound for focal liver lesions is diminished significantly given this degree of increased echogenicity, presumably due to fatty change. In particular, the areas of inhomogeneity seen earlier on recent CT are not appreciated on this study. In this circumstance, MR pre and post-contrast would be the imaging study of choice to further evaluate these liver lesions.  No gallbladder pathology is seen. Only a small portion of the biliary ductal system is seen. The portion of the biliary ductal system which is seen is within normal limits.    Electronically Signed   By: Lowella Grip M.D.   On: 12/16/2013 21:12    Labs Review Labs Reviewed  WET PREP, GENITAL - Abnormal; Notable for the following:    WBC, Wet Prep HPF POC FEW (*)    All other components within normal limits  CBC WITH DIFFERENTIAL - Abnormal; Notable for the following:    WBC 18.1 (*)    Hemoglobin 15.2 (*)    Neutro Abs 12.9 (*)    All other components within normal limits  COMPREHENSIVE METABOLIC PANEL - Abnormal; Notable for the following:    Sodium 133 (*)    Chloride 93 (*)    Glucose, Bld 380 (*)    Creatinine, Ser 1.15 (*)    Alkaline Phosphatase 120 (*)    GFR calc non Af Amer 60 (*)    GFR calc Af Amer 69 (*)    All other components within normal limits  LIPASE, BLOOD - Abnormal; Notable for the following:    Lipase 76 (*)    All other components within normal limits  URINALYSIS, ROUTINE W REFLEX MICROSCOPIC - Abnormal; Notable for the following:    APPearance CLOUDY (*)    Glucose, UA >1000 (*)    Hgb urine dipstick MODERATE (*)    Nitrite POSITIVE (*)    Leukocytes, UA MODERATE (*)    All other components within normal limits  URINE MICROSCOPIC-ADD ON - Abnormal; Notable for the following:    Bacteria, UA MANY (*)    All other components within normal limits  BASIC METABOLIC PANEL - Abnormal; Notable for the following:    Glucose, Bld 256 (*)    Creatinine, Ser 1.12 (*)    GFR calc non Af Amer 61 (*)    GFR calc Af Amer 71 (*)    All other components within normal limits  I-STAT CG4 LACTIC ACID, ED - Abnormal; Notable for the following:    Lactic Acid, Venous 2.80 (*)    All other components within normal limits  GC/CHLAMYDIA PROBE AMP  PREGNANCY, URINE  TROPONIN I  TROPONIN I  POC OCCULT BLOOD, ED  I-STAT CG4 LACTIC ACID, ED   Imaging Review Ct Abdomen Pelvis Wo Contrast  12/16/2013   CLINICAL DATA:  Pain.  EXAM: CT ABDOMEN AND PELVIS WITHOUT CONTRAST  TECHNIQUE: Multidetector CT imaging of  the abdomen and pelvis was  performed following the standard protocol without intravenous contrast.  COMPARISON:  CT ABD/PELV WO CM dated 12/19/2010  FINDINGS: Ill-defined lucencies are noted in the right and left lobes of the liver. Although these changes could be related to fatty infiltration, a significant hepatic lesion cannot be excluded and MRI of the abdomen suggests for further evaluation. Spleen is normal. Pancreas is normal. Gallbladder is nondistended. No biliary distention.  Tiny fatty lesion is noted in the left adrenal consistent with a a adrenal myelolipoma. Extensive bilateral nonobstructing nephrolithiasis is present. Bladder is nondistended. Uterus and adnexa are stable. Rounded soft tissue density noted along the right posterior aspect uterus is most likely ovary, this is unchanged in appearance. No free pelvic fluid.  Multiple retroperitoneal lymph nodes are noted. The largest measures 1.3 cm, image number 37/series 2. Aorta normal caliber.  Appendix normal. No inflammatory changes in right or left lower quadrant. No bowel distention. Stool is present throughout the colon. No free air. No mesenteric masses noted. Tiny umbilical hernia with herniation of fat only noted.  Mild infiltrate with slight nodularity right middle lobe . Heart size normal. No acute bony abnormality.  IMPRESSION: 1. Ill-defined prominent lucencies of the right and left lobes of the liver. Although steatosis could present in this fashion, significant hepatic lesions cannot be excluded and MRI of the abdomen suggest for further evaluation. 2. Multiple retroperitoneal lymph nodes are present. The largest measures 1.3 cm. Further evaluation with PET-CT could be obtained. 3. Nonobstructive bilateral nephrolithiasis. 4. Left adrenal myelolipoma.   Electronically Signed   By: Marcello Moores  Register   On: 12/16/2013 20:00   US Abdomen Limited Ruq  12/16/2013   CLINICAL DATA:  Upper abdominal pain  EXAM: US ABDOMEN LIMITED - RIGHT UPPER QUADRANT  COMPARISON:  CT  abdomen and pelvis December 16, 2013  FINDINGS: Gallbladder:  No gallstones or wall thickening visualized. There is no pericholecystic fluid. The patient is tender over the gallbladder.  Common bile duct:  Only a small portion of the common bile duct is visualized. This visualized portion measures 4 mm. No segments of dilated biliary duct seen.  Liver:  The liver shows marked increased echogenicity. No focal liver lesions are identified.  IMPRESSION: The liver shows marked increased echogenicity. No focal liver lesions are identified. It must be noted that the sensitivity of ultrasound for focal liver lesions is diminished significantly given this degree of increased echogenicity, presumably due to fatty change. In particular, the areas of inhomogeneity seen earlier on recent CT are not appreciated on this study. In this circumstance, MR pre and post-contrast would be the imaging study of choice to further evaluate these liver lesions.  No gallbladder pathology is seen. Only a small portion of the biliary ductal system is seen. The portion of the biliary ductal system which is seen is within normal limits.   Electronically Signed   By: Lowella Grip M.D.   On: 12/16/2013 21:12     EKG Interpretation   Date/Time:  Sunday December 16 2013 16:37:15 EDT Ventricular Rate:  113 PR Interval:  163 QRS Duration: 85 QT Interval:  346 QTC Calculation: 474 R Axis:   29 Text Interpretation:  Sinus tachycardia Borderline T abnormalities,  diffuse leads Baseline wander in lead(s) V6 Nonspecific T waves in lateral  precordial leads are new Confirmed by GOLDSTON  MD, SCOTT ([id number]) on  12/16/2013 4:54:15 PM      MDM   Final diagnoses:  UTI (urinary tract infection)  Abdominal pain  Steatosis, liver  Nephrolithiasis   Medications  sodium chloride 0.9 % bolus 1,000 mL (0 mLs Intravenous Stopped 12/16/13 2023)  ondansetron (ZOFRAN) injection 4 mg (4 mg Intravenous Given 12/16/13 1750)  sodium chloride 0.9 %  bolus 1,000 mL (0 mLs Intravenous Stopped 12/16/13 2305)  cephALEXin (KEFLEX) capsule 500 mg (500 mg Oral Given 12/16/13 2023)  promethazine (PHENERGAN) injection 25 mg (25 mg Intravenous Given 12/16/13 2021)  HYDROmorphone (DILAUDID) injection 0.5 mg (0.5 mg Intravenous Given 12/16/13 2020)   Filed Vitals:   12/16/13 1607 12/16/13 2132  BP: 159/86 132/80  Pulse: 115 99  Temp: 98.5 F (36.9 C) 98.9 F (37.2 C)  TempSrc: Oral Oral  Resp: 20 20  Height: 5\' 7"  (1.702 m)   Weight: 280 lb (127.007 kg)   SpO2: 100% 93%    Patient presenting to the ED with abdominal pain localized to the epigastric, RUQ region that has been ongoing for the past 3-4 weeks described as a shooting pain with intermittent radiation to the center of her chest - stated that she has been having nausea with emesis over the past 3-4 weeks - stated that she has been unable to keep any food or fluids down - stated that pain worsens with food, alleviated with applying pressure. Stated that she has been dealing with kidney stone pain for the past 10 years - stated that the pain worsened this week starting with right flank pain then bilateral described as a pressure sensation without radiation that is constant. Stated that she has been experiencing a suprapubic pressure for the past couple of days with dysuria and vaginal pruritis - stated that she has been taking over the counter medications without relief. Reported that she has been using Tylenol with minimal relief. Stated that she has not seen a Dealer in 6-7 years or a GI physician within 10 years.  Alert and oriented. GCS 15. Negative diaphoresis. Heart rate noted to be mild tachycardia with normal rhythm. Lungs clear to auscultation. Negative pain upon palpation to the chest wall. Radial pulses 2+ bilaterally. Obese. BS normoactive in all 4 quadrants with pain upon palpation to the epigastric region and RUQ - positive Murphy's sign. Mild suprapubic discomfort upon palpation.  Negative swelling or erythema noted to the anus - negative masses palpated. Negative blood on glove.  EKG noted sinus tachycardia with a heart rate of 113 bpm - t waves in lateral leads are new findings. First troponin negative elevation. Second troponin negative elevation. CBC noted elevation white blood cell count of 18.5 with neutrophil count of 12.9. Mildly elevated hemoglobin of 15.2-patient currently dehydrated. CMP identified low sodium of 133, low chloride of 93. Glucose elevated at 380. Anion gap 17.0 mEq per liter. Elevated creatinine of 1.15. Lipase elevation of 76. Lactic acid elevated at 2.80. Fecal occult negative. Urine pregnancy negative. UA noted positive nitrites with moderate leukocytes and many bacteria with WBC of 21-50. Wet prep with few WBC - negative trich. GC/Chlamdyia probe pending. CT abdomen and pelvis without contrast noted ill-defined prominent lucencies of the right and left lobes of the liver-although steatosis can present in this fashion hepatic lesions cannot be excluded in the MRI of the abdomen is suggested for further evaluation. Multiple retroperitoneal lymph nodes are present largest measuring 1.3 cm-PET-CT could be obtained. Nonobstructive bilateral nephrolithiasis identified. Left adrenal myelolipoma.  Right upper quadrant ultrasounds noted-negative gallbladder pathology-portion of the biliary ductal system that is seen on ultrasound is within normal limits. After 2 L of  IV fluids lactic acid decreased to 1.44. Repeat BMP noted drop in anion gap, decreased to 16.0 mEq per liter. Glucose dropped from 380 to 256. Negative ketones in urine.  Doubt DKA. Patient presenting with bilateral nephrolithiasis with urinary tract infection noted - cannot rule out pyelonephritis. RUQ appears to not be gallbladder related, but appears to be related to steatosis of the liver - AST and ALT not elevated, doubt acute hepatitis issue at this moment. Continuous elevated Creatinine noted when  compared to 3 years ago, but appears to be improved. Elevated sugars, possible beginning of diabetes. Lymph node enlargement noted, recommended PET-CT scan. Patient appeared to be dehydrated. Heart rate has improved, decreased from 115 bpm to 99 bpm. Patient stable, afebrile. Patient does not appear septic. Patient seen and assessed by attending physician who cleared patient for discharge. Discharged patient. Discharged patient with antibiotics, antiemetics and small dose of pain medications - discussed course, precautions, disposal technique. Referred patient to [facility] to get blood glucose monitored, recommended HbA1c to be performed in order to rule out possible beginnings of diabetes. Referred patient to GI and general surgery regarding liver issue and lymph nodes. Discussed with patient diet and proper hydration. Discussed with patient to closely monitor symptoms and if symptoms are to worsen or change to report back to the ED - strict return instructions given.  Patient agreed to plan of care, understood, all questions answered.   Jamse Mead, PA-C 12/17/13 0124

## 2013-12-17 LAB — GC/CHLAMYDIA PROBE AMP
CT Probe RNA: NEGATIVE
GC Probe RNA: NEGATIVE

## 2013-12-17 NOTE — ED Provider Notes (Signed)
Medical screening examination/treatment/procedure(s) were conducted as a shared visit with non-physician practitioner(s) and myself.  I personally evaluated the patient during the encounter.   EKG Interpretation   Date/Time:  Sunday December 16 2013 16:37:15 EDT Ventricular Rate:  113 PR Interval:  163 QRS Duration: 85 QT Interval:  346 QTC Calculation: 474 R Axis:   29 Text Interpretation:  Sinus tachycardia Borderline T abnormalities,  diffuse leads Baseline wander in lead(s) V6 Nonspecific T waves in lateral  precordial leads are new Confirmed by Korie Brabson  MD, Evie Crumpler ([id number]) on  12/16/2013 4:54:15 PM      Patient with RUQ abd pain. Mild tenderness here, but she appears comfortable. Difficult exam due to obesity. CT and RUQ u/s show no GB pathology. Has a UTI w/o obstructing kidney stone. Given her intermittent flank pain will treat as pyelo. However, she appears well, is not vomiting and not febrile. Feel she can have a trial of outpatient abx for pyelo with close PCP f/u.  Ephraim Hamburger, MD 12/17/13 [phone]

## 2014-03-03 ENCOUNTER — Encounter (HOSPITAL_COMMUNITY): Payer: Self-pay | Admitting: Emergency Medicine

## 2014-03-03 ENCOUNTER — Emergency Department (HOSPITAL_COMMUNITY)
Admission: EM | Admit: 2014-03-03 | Discharge: 2014-03-04 | Disposition: A | Discharge status: Home / Self Care | Payer: Medicaid Other | Attending: Emergency Medicine | Admitting: Emergency Medicine

## 2014-03-03 DIAGNOSIS — Z8639 Personal history of other endocrine, nutritional and metabolic disease: Secondary | ICD-10-CM | POA: Insufficient documentation

## 2014-03-03 DIAGNOSIS — Z87442 Personal history of urinary calculi: Secondary | ICD-10-CM | POA: Diagnosis not present

## 2014-03-03 DIAGNOSIS — Z8719 Personal history of other diseases of the digestive system: Secondary | ICD-10-CM | POA: Insufficient documentation

## 2014-03-03 DIAGNOSIS — M5430 Sciatica, unspecified side: Secondary | ICD-10-CM

## 2014-03-03 DIAGNOSIS — Z862 Personal history of diseases of the blood and blood-forming organs and certain disorders involving the immune mechanism: Secondary | ICD-10-CM | POA: Diagnosis not present

## 2014-03-03 DIAGNOSIS — F17200 Nicotine dependence, unspecified, uncomplicated: Secondary | ICD-10-CM | POA: Diagnosis not present

## 2014-03-03 DIAGNOSIS — N390 Urinary tract infection, site not specified: Secondary | ICD-10-CM | POA: Diagnosis not present

## 2014-03-03 DIAGNOSIS — Z8659 Personal history of other mental and behavioral disorders: Secondary | ICD-10-CM | POA: Diagnosis not present

## 2014-03-03 DIAGNOSIS — Z3202 Encounter for pregnancy test, result negative: Secondary | ICD-10-CM | POA: Insufficient documentation

## 2014-03-03 DIAGNOSIS — Z792 Long term (current) use of antibiotics: Secondary | ICD-10-CM | POA: Diagnosis not present

## 2014-03-03 DIAGNOSIS — Z8679 Personal history of other diseases of the circulatory system: Secondary | ICD-10-CM | POA: Insufficient documentation

## 2014-03-03 DIAGNOSIS — M79609 Pain in unspecified limb: Secondary | ICD-10-CM | POA: Diagnosis present

## 2014-03-03 LAB — CBC WITH DIFFERENTIAL/PLATELET
Basophils Absolute: 0.1 10*3/uL (ref 0.0–0.1)
Basophils Relative: 0 % (ref 0–1)
Eosinophils Absolute: 0.3 10*3/uL (ref 0.0–0.7)
Eosinophils Relative: 3 % (ref 0–5)
HCT: 44.5 % (ref 36.0–46.0)
Hemoglobin: 14.5 g/dL (ref 12.0–15.0)
Lymphocytes Relative: 30 % (ref 12–46)
Lymphs Abs: 3.8 10*3/uL (ref 0.7–4.0)
MCH: 30.5 pg (ref 26.0–34.0)
MCHC: 32.6 g/dL (ref 30.0–36.0)
MCV: 93.5 fL (ref 78.0–100.0)
Monocytes Absolute: 0.6 10*3/uL (ref 0.1–1.0)
Monocytes Relative: 5 % (ref 3–12)
Neutro Abs: 7.8 10*3/uL — ABNORMAL HIGH (ref 1.7–7.7)
Neutrophils Relative %: 62 % (ref 43–77)
Platelets: 338 10*3/uL (ref 150–400)
RBC: 4.76 MIL/uL (ref 3.87–5.11)
RDW: 13.2 % (ref 11.5–15.5)
WBC: 12.6 10*3/uL — ABNORMAL HIGH (ref 4.0–10.5)

## 2014-03-03 LAB — BASIC METABOLIC PANEL
BUN: 9 mg/dL (ref 6–23)
CO2: 25 mEq/L (ref 19–32)
Calcium: 9.9 mg/dL (ref 8.4–10.5)
Chloride: 104 mEq/L (ref 96–112)
Creatinine, Ser: 1.06 mg/dL (ref 0.50–1.10)
GFR calc Af Amer: 76 mL/min — ABNORMAL LOW (ref >90)
GFR calc non Af Amer: 65 mL/min — ABNORMAL LOW (ref >90)
Glucose, Bld: 179 mg/dL — ABNORMAL HIGH (ref 70–99)
Potassium: 4.4 mEq/L (ref 3.7–5.3)
Sodium: 141 mEq/L (ref 137–147)

## 2014-03-03 LAB — URINALYSIS, ROUTINE W REFLEX MICROSCOPIC
Bilirubin Urine: NEGATIVE
Glucose, UA: 500 mg/dL — AB
Ketones, ur: NEGATIVE mg/dL
Nitrite: POSITIVE — AB
Protein, ur: NEGATIVE mg/dL
Specific Gravity, Urine: 1.014 (ref 1.005–1.030)
Urobilinogen, UA: 0.2 mg/dL (ref 0.0–1.0)
pH: 6 (ref 5.0–8.0)

## 2014-03-03 LAB — URINE MICROSCOPIC-ADD ON

## 2014-03-03 LAB — PREGNANCY, URINE: Preg Test, Ur: NEGATIVE

## 2014-03-03 MED ORDER — KETOROLAC TROMETHAMINE 30 MG/ML IJ SOLN
30.0000 mg | Freq: Once | INTRAMUSCULAR | Status: AC
  Administered 2014-03-04: 30 mg via INTRAVENOUS
  Filled 2014-03-03: qty 1

## 2014-03-03 MED ORDER — MORPHINE SULFATE 4 MG/ML IJ SOLN
6.0000 mg | Freq: Once | INTRAMUSCULAR | Status: AC
  Administered 2014-03-03: 6 mg via INTRAVENOUS
  Filled 2014-03-03: qty 2

## 2014-03-03 MED ORDER — ONDANSETRON HCL 4 MG/2ML IJ SOLN
4.0000 mg | Freq: Once | INTRAMUSCULAR | Status: AC
  Administered 2014-03-03: 4 mg via INTRAVENOUS
  Filled 2014-03-03: qty 2

## 2014-03-03 MED ORDER — DEXTROSE 5 % IV SOLN
1.0000 g | Freq: Once | INTRAVENOUS | Status: AC
  Administered 2014-03-03: 1 g via INTRAVENOUS
  Filled 2014-03-03: qty 10

## 2014-03-03 MED ORDER — HYDROMORPHONE HCL PF 1 MG/ML IJ SOLN
1.0000 mg | Freq: Once | INTRAMUSCULAR | Status: AC
  Administered 2014-03-03: 1 mg via INTRAVENOUS
  Filled 2014-03-03: qty 1

## 2014-03-03 MED ORDER — SODIUM CHLORIDE 0.9 % IV BOLUS (SEPSIS)
1000.0000 mL | Freq: Once | INTRAVENOUS | Status: AC
  Administered 2014-03-03: 1000 mL via INTRAVENOUS

## 2014-03-03 NOTE — ED Provider Notes (Signed)
CSN: [id number]     Arrival date & time 03/03/14  1727 History    Chief Complaint  Patient presents with  . Leg Pain   The history is provided by the patient. No language interpreter was used.   HPI Comments: Terri Sparks is a 38 y.o. female who presents to the Emergency Department complaining of lower back pain that radiates to her leg.  Patient, states she's also had some mild nausea and vomiting over the last 2 days patient states, that she's had this  back pain for over a month.patient, states she may have some mild swelling in her lower extremities.  Patient, states she's not had any chest pain, shortness of breath, headache, weakness, dizziness, numbness fever, rash, bloody stool, neck pain, blurred vision, or syncope.  Patient, states she didn't take any medications prior to arrival   Past Medical History  Diagnosis Date  . History of blood clots   . GERD (gastroesophageal reflux disease)   . Renal calculi   . Hyperparathyroidism   . Hypercalcemia   . Anxiety   . Nephrolithiasis    Past Surgical History  Procedure Laterality Date  . Tubal ligation    . 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 History  Substance Use Topics  . Smoking status: Current Every Day Smoker  . Smokeless tobacco: Not on file  . Alcohol Use: No   OB History   Grav Para Term Preterm Abortions TAB SAB Ect Mult Living                 Review of Systems All other systems negative except as documented in the HPI. All pertinent positives and negatives as reviewed in the HPI.   Allergies  Macrobid and Sulfa antibiotics  Home Medications   Prior to Admission medications   Medication Sig Start Date End Date Taking? Authorizing Provider  acetaminophen (TYLENOL) 500 MG tablet Take 3,000 mg by mouth once.    Historical Provider, MD  cephALEXin (KEFLEX) 500 MG capsule Take 1 capsule (500 mg total) by mouth 3 (three) times daily. 12/16/13   Marissa Sciacca, PA-C   HYDROcodone-acetaminophen (NORCO/VICODIN) 5-325 MG per tablet Take 1 tablet by mouth every 6 (six) hours as needed. 12/16/13   Marissa Sciacca, PA-C  ibuprofen (ADVIL,MOTRIN) 200 MG tablet Take 800 mg by mouth every 6 (six) hours as needed for moderate pain.    Historical Provider, MD  ondansetron (ZOFRAN) 4 MG tablet Take 1 tablet (4 mg total) by mouth every 6 (six) hours. 12/16/13   Marissa Sciacca, PA-C   Triage Vitals: BP 147/93  Pulse 96  Temp(Src) 98.3 F (36.8 C) (Oral)  Resp 20  SpO2 99%  LMP 12/24/2013  Physical Exam  Nursing note and vitals reviewed. Constitutional: She is oriented to person, place, and time. She appears well-developed and well-nourished. No distress.  HENT:  Head: Normocephalic and atraumatic.  Mouth/Throat: Oropharynx is clear and moist.  Eyes: Pupils are equal, round, and reactive to light.  Neck: Normal range of motion. Neck supple.  Cardiovascular: Normal rate, regular rhythm and normal heart sounds.  Exam reveals no gallop and no friction rub.   No murmur heard. Pulmonary/Chest: Effort normal and breath sounds normal.  Abdominal: Soft. Bowel sounds are normal. She exhibits no distension. There is no tenderness. There is no rebound and no guarding.  Musculoskeletal:       Cervical back: She exhibits normal range of motion, no bony tenderness, no swelling,  no deformity, no laceration, no pain and no spasm.       Lumbar back: She exhibits tenderness and pain. She exhibits normal range of motion, no bony tenderness and no spasm.  Neurological: She is alert and oriented to person, place, and time. She exhibits normal muscle tone. Coordination normal.  Skin: Skin is warm and dry. No rash noted. No erythema.    ED Course  Procedures (including critical care time) DIAGNOSTIC STUDIES: Oxygen Saturation is 99% on room air, normal by my interpretation.   Patient be treated for sciatica, along with the, urinary tract infection.  Patient is advised to follow up  with her primary care Dr. told to return here as needed.  Patient is given the plan and all of her test results.  She voices an understanding and all questions were answered.  She's been stable here in the emergency department.  He does not have any neurological deficits noted on exam and normal reflexes and a normal gait     Brent General, PA-C 03/04/14 0018

## 2014-03-03 NOTE — ED Notes (Signed)
Pt reports L buttock and leg pain. L leg swelling. x1 month. Also reports bulging veins in L upper arm, and chest. Also reports anxiety related to leg pain. Hx blood clot under L breast 13 years ago. (not PE).

## 2014-03-04 MED ORDER — CYCLOBENZAPRINE HCL 10 MG PO TABS: 10.0000 mg | ORAL_TABLET | Freq: Three times a day (TID) | ORAL | Status: DC | PRN

## 2014-03-04 MED ORDER — PREDNISONE 50 MG PO TABS: 50.0000 mg | ORAL_TABLET | Freq: Every day | ORAL | Status: DC

## 2014-03-04 MED ORDER — HYDROCODONE-ACETAMINOPHEN 5-325 MG PO TABS: 1.0000 | ORAL_TABLET | Freq: Four times a day (QID) | ORAL | Status: DC | PRN

## 2014-03-04 MED ORDER — CEPHALEXIN 500 MG PO CAPS: 1000.0000 mg | ORAL_CAPSULE | Freq: Two times a day (BID) | ORAL | Status: DC

## 2014-03-04 NOTE — Discharge Instructions (Signed)
Return here as needed.  Followup with the clinic provided.  Use ice and heat on your lower back.  Increase your fluid intake

## 2014-03-06 NOTE — ED Provider Notes (Signed)
Medical screening examination/treatment/procedure(s) were performed by non-physician practitioner and as supervising physician I was immediately available for consultation/collaboration.   EKG Interpretation None        Ephraim Hamburger, MD 03/06/14 [phone]

## 2014-08-26 ENCOUNTER — Emergency Department (HOSPITAL_COMMUNITY): Payer: Medicaid Other

## 2014-08-26 ENCOUNTER — Encounter (HOSPITAL_COMMUNITY): Payer: Self-pay | Admitting: Emergency Medicine

## 2014-08-26 ENCOUNTER — Emergency Department (HOSPITAL_COMMUNITY)
Admission: EM | Admit: 2014-08-26 | Discharge: 2014-08-26 | Disposition: A | Discharge status: Home / Self Care | Payer: Medicaid Other | Attending: Emergency Medicine | Admitting: Emergency Medicine

## 2014-08-26 DIAGNOSIS — Z791 Long term (current) use of non-steroidal anti-inflammatories (NSAID): Secondary | ICD-10-CM | POA: Diagnosis not present

## 2014-08-26 DIAGNOSIS — Z8659 Personal history of other mental and behavioral disorders: Secondary | ICD-10-CM | POA: Diagnosis not present

## 2014-08-26 DIAGNOSIS — Z3202 Encounter for pregnancy test, result negative: Secondary | ICD-10-CM | POA: Insufficient documentation

## 2014-08-26 DIAGNOSIS — Z7951 Long term (current) use of inhaled steroids: Secondary | ICD-10-CM | POA: Insufficient documentation

## 2014-08-26 DIAGNOSIS — Z792 Long term (current) use of antibiotics: Secondary | ICD-10-CM | POA: Insufficient documentation

## 2014-08-26 DIAGNOSIS — Z8639 Personal history of other endocrine, nutritional and metabolic disease: Secondary | ICD-10-CM | POA: Diagnosis not present

## 2014-08-26 DIAGNOSIS — Z86711 Personal history of pulmonary embolism: Secondary | ICD-10-CM | POA: Insufficient documentation

## 2014-08-26 DIAGNOSIS — R51 Headache: Secondary | ICD-10-CM | POA: Insufficient documentation

## 2014-08-26 DIAGNOSIS — Z87442 Personal history of urinary calculi: Secondary | ICD-10-CM | POA: Insufficient documentation

## 2014-08-26 DIAGNOSIS — R000 Tachycardia, unspecified: Secondary | ICD-10-CM | POA: Insufficient documentation

## 2014-08-26 DIAGNOSIS — R110 Nausea: Secondary | ICD-10-CM | POA: Insufficient documentation

## 2014-08-26 DIAGNOSIS — R0789 Other chest pain: Secondary | ICD-10-CM

## 2014-08-26 DIAGNOSIS — Z8719 Personal history of other diseases of the digestive system: Secondary | ICD-10-CM | POA: Diagnosis not present

## 2014-08-26 DIAGNOSIS — R079 Chest pain, unspecified: Secondary | ICD-10-CM | POA: Diagnosis present

## 2014-08-26 DIAGNOSIS — R519 Headache, unspecified: Secondary | ICD-10-CM

## 2014-08-26 DIAGNOSIS — Z86718 Personal history of other venous thrombosis and embolism: Secondary | ICD-10-CM | POA: Insufficient documentation

## 2014-08-26 DIAGNOSIS — Z720 Tobacco use: Secondary | ICD-10-CM | POA: Diagnosis not present

## 2014-08-26 DIAGNOSIS — H53149 Visual discomfort, unspecified: Secondary | ICD-10-CM | POA: Insufficient documentation

## 2014-08-26 LAB — BASIC METABOLIC PANEL
Anion gap: 13 (ref 5–15)
BUN: 10 mg/dL (ref 6–23)
CO2: 24 mEq/L (ref 19–32)
Calcium: 10 mg/dL (ref 8.4–10.5)
Chloride: 100 mEq/L (ref 96–112)
Creatinine, Ser: 1.21 mg/dL — ABNORMAL HIGH (ref 0.50–1.10)
GFR calc Af Amer: 64 mL/min — ABNORMAL LOW (ref >90)
GFR calc non Af Amer: 56 mL/min — ABNORMAL LOW (ref >90)
Glucose, Bld: 245 mg/dL — ABNORMAL HIGH (ref 70–99)
Potassium: 5 mEq/L (ref 3.7–5.3)
Sodium: 137 mEq/L (ref 137–147)

## 2014-08-26 LAB — CBC
HCT: 47 % — ABNORMAL HIGH (ref 36.0–46.0)
Hemoglobin: 15.6 g/dL — ABNORMAL HIGH (ref 12.0–15.0)
MCH: 31 pg (ref 26.0–34.0)
MCHC: 33.2 g/dL (ref 30.0–36.0)
MCV: 93.4 fL (ref 78.0–100.0)
Platelets: 381 10*3/uL (ref 150–400)
RBC: 5.03 MIL/uL (ref 3.87–5.11)
RDW: 13.8 % (ref 11.5–15.5)
WBC: 15.9 10*3/uL — ABNORMAL HIGH (ref 4.0–10.5)

## 2014-08-26 LAB — I-STAT TROPONIN, ED: Troponin i, poc: 0 ng/mL (ref 0.00–0.08)

## 2014-08-26 LAB — POC URINE PREG, ED: Preg Test, Ur: NEGATIVE

## 2014-08-26 LAB — TROPONIN I: Troponin I: <0.3 ng/mL (ref <0.30)

## 2014-08-26 MED ORDER — METOCLOPRAMIDE HCL 5 MG/ML IJ SOLN
10.0000 mg | Freq: Once | INTRAMUSCULAR | Status: AC
  Administered 2014-08-26: 10 mg via INTRAVENOUS
  Filled 2014-08-26: qty 2

## 2014-08-26 MED ORDER — DIPHENHYDRAMINE HCL 50 MG/ML IJ SOLN
25.0000 mg | Freq: Once | INTRAMUSCULAR | Status: AC
  Administered 2014-08-26: 25 mg via INTRAVENOUS
  Filled 2014-08-26: qty 1

## 2014-08-26 MED ORDER — NAPROXEN 500 MG PO TABS: 500.0000 mg | ORAL_TABLET | Freq: Two times a day (BID) | ORAL | Status: DC

## 2014-08-26 MED ORDER — ONDANSETRON HCL 4 MG/2ML IJ SOLN
4.0000 mg | Freq: Once | INTRAMUSCULAR | Status: AC
  Administered 2014-08-26: 4 mg via INTRAVENOUS
  Filled 2014-08-26: qty 2

## 2014-08-26 MED ORDER — IOHEXOL 350 MG/ML SOLN
100.0000 mL | Freq: Once | INTRAVENOUS | Status: AC | PRN
  Administered 2014-08-26: 100 mL via INTRAVENOUS

## 2014-08-26 MED ORDER — MORPHINE SULFATE 4 MG/ML IJ SOLN
4.0000 mg | Freq: Once | INTRAMUSCULAR | Status: AC
  Administered 2014-08-26: 4 mg via INTRAVENOUS
  Filled 2014-08-26: qty 1

## 2014-08-26 MED ORDER — DEXAMETHASONE SODIUM PHOSPHATE 10 MG/ML IJ SOLN
10.0000 mg | Freq: Once | INTRAMUSCULAR | Status: AC
  Administered 2014-08-26: 10 mg via INTRAVENOUS
  Filled 2014-08-26: qty 1

## 2014-08-26 MED ORDER — SODIUM CHLORIDE 0.9 % IV BOLUS (SEPSIS)
1000.0000 mL | Freq: Once | INTRAVENOUS | Status: AC
  Administered 2014-08-26: 1000 mL via INTRAVENOUS

## 2014-08-26 NOTE — ED Provider Notes (Signed)
Medical screening examination/treatment/procedure(s) were conducted as a shared visit with non-physician practitioner(s) and myself.  I personally evaluated the patient during the encounter.   EKG Interpretation   Date/Time:  Monday August 26 2014 15:32:54 EST Ventricular Rate:  107 PR Interval:  126 QRS Duration: 72 QT Interval:  324 QTC Calculation: 432 R Axis:   70 Text Interpretation:  Sinus tachycardia Atrial premature complex Baseline  wander in lead(s) II nonspecific t wave changes no longer present  Confirmed by [facility]  MD, TREY [id number]) on 08/26/2014 4:51:15 PM      38 yo female presenting with headache and chest pain.  Headache started first.  Off and on for a few weeks, then constant for past several days.  Chest pain is intermittent and pt attributes it to anxiety.  On exam, well appearing, nontoxic, not distressed, normal respiratory effort, normal perfusion, CN II-XII intact, normal strength, normal sensation, normal finger to nose, heart sounds normal with RRR, lungs CTAb.    Head CT and CTA PE study negative.  Pt asymptomatic after migraine cocktail. Plan DC home.   Clinical Impression: 1. Atypical chest pain   2. Headache       Artis Delay, MD 08/27/14 [phone]

## 2014-08-26 NOTE — Discharge Instructions (Signed)
Chest Pain (Nonspecific) °It is often hard to give a specific diagnosis for the cause of chest pain. There is always a chance that your pain could be related to something serious, such as a heart attack or a blood clot in the lungs. You need to follow up with your health care provider for further evaluation. °CAUSES  °· Heartburn. °· Pneumonia or bronchitis. °· Anxiety or stress. °· Inflammation around your heart (pericarditis) or lung (pleuritis or pleurisy). °· A blood clot in the lung. °· A collapsed lung (pneumothorax). It can develop suddenly on its own (spontaneous pneumothorax) or from trauma to the chest. °· Shingles infection (herpes zoster virus). °The chest wall is composed of bones, muscles, and cartilage. Any of these can be the source of the pain. °· The bones can be bruised by injury. °· The muscles or cartilage can be strained by coughing or overwork. °· The cartilage can be affected by inflammation and become sore (costochondritis). °DIAGNOSIS  °Lab tests or other studies may be needed to find the cause of your pain. Your health care provider may have you take a test called an ambulatory electrocardiogram (ECG). An ECG records your heartbeat patterns over a 24-hour period. You may also have other tests, such as: °· Transthoracic echocardiogram (TTE). During echocardiography, sound waves are used to evaluate how blood flows through your heart. °· Transesophageal echocardiogram (TEE). °· Cardiac monitoring. This allows your health care provider to monitor your heart rate and rhythm in real time. °· Holter monitor. This is a portable device that records your heartbeat and can help diagnose heart arrhythmias. It allows your health care provider to track your heart activity for several days, if needed. °· Stress tests by exercise or by giving medicine that makes the heart beat faster. °TREATMENT  °· Treatment depends on what may be causing your chest pain. Treatment may include: °· Acid blockers for  heartburn. °· Anti-inflammatory medicine. °· Pain medicine for inflammatory conditions. °· Antibiotics if an infection is present. °· You may be advised to change lifestyle habits. This includes stopping smoking and avoiding alcohol, caffeine, and chocolate. °· You may be advised to keep your head raised (elevated) when sleeping. This reduces the chance of acid going backward from your stomach into your esophagus. °Most of the time, nonspecific chest pain will improve within 2-3 days with rest and mild pain medicine.  °HOME CARE INSTRUCTIONS  °· If antibiotics were prescribed, take them as directed. Finish them even if you start to feel better. °· For the next few days, avoid physical activities that bring on chest pain. Continue physical activities as directed. °· Do not use any tobacco products, including cigarettes, chewing tobacco, or electronic cigarettes. °· Avoid drinking alcohol. °· Only take medicine as directed by your health care provider. °· Follow your health care provider's suggestions for further testing if your chest pain does not go away. °· Keep any follow-up appointments you made. If you do not go to an appointment, you could develop lasting (chronic) problems with pain. If there is any problem keeping an appointment, call to reschedule. °SEEK MEDICAL CARE IF:  °· Your chest pain does not go away, even after treatment. °· You have a rash with blisters on your chest. °· You have a fever. °SEEK IMMEDIATE MEDICAL CARE IF:  °· You have increased chest pain or pain that spreads to your arm, neck, jaw, back, or abdomen. °· You have shortness of breath. °· You have an increasing cough, or you cough   up blood.  You have severe back or abdominal pain.  You feel nauseous or vomit.  You have severe weakness.  You faint.  You have chills. This is an emergency. Do not wait to see if the pain will go away. Get medical help at once. Call your local emergency services (911 in [place] Do not drive  yourself to the hospital. MAKE SURE YOU:   Understand these instructions.  Will watch your condition.  Will get help right away if you are not doing well or get worse. Document Released: 06/30/2005 Document Revised: 09/25/2013 Document Reviewed: 04/25/2008 [facility] Patient Information 2015 [place], [place]. This information is not intended to replace advice given to you by your health care provider. Make sure you discuss any questions you have with your health care provider. General Headache Without Cause A headache is pain or discomfort felt around the head or neck area. The specific cause of a headache may not be found. There are many causes and types of headaches. A few common ones are:  Tension headaches.  Migraine headaches.  Cluster headaches.  Chronic daily headaches. HOME CARE INSTRUCTIONS   Keep all follow-up appointments with your caregiver or any specialist referral.  Only take over-the-counter or prescription medicines for pain or discomfort as directed by your caregiver.  Lie down in a dark, quiet room when you have a headache.  Keep a headache journal to find out what may trigger your migraine headaches. For example, write down:  What you eat and drink.  How much sleep you get.  Any change to your diet or medicines.  Try massage or other relaxation techniques.  Put ice packs or heat on the head and neck. Use these 3 to 4 times per day for 15 to 20 minutes each time, or as needed.  Limit stress.  Sit up straight, and do not tense your muscles.  Quit smoking if you smoke.  Limit alcohol use.  Decrease the amount of caffeine you drink, or stop drinking caffeine.  Eat and sleep on a regular schedule.  Get 7 to 9 hours of sleep, or as recommended by your caregiver.  Keep lights dim if bright lights bother you and make your headaches worse. SEEK MEDICAL CARE IF:   You have problems with the medicines you were prescribed.  Your medicines are not  working.  You have a change from the usual headache.  You have nausea or vomiting. SEEK IMMEDIATE MEDICAL CARE IF:   Your headache becomes severe.  You have a fever.  You have a stiff neck.  You have loss of vision.  You have muscular weakness or loss of muscle control.  You start losing your balance or have trouble walking.  You feel faint or pass out.  You have severe symptoms that are different from your first symptoms. MAKE SURE YOU:   Understand these instructions.  Will watch your condition.  Will get help right away if you are not doing well or get worse. Document Released: 09/20/2005 Document Revised: 12/13/2011 Document Reviewed: 10/06/2011 [facility] Patient Information 2015 [place], [place]. This information is not intended to replace advice given to you by your health care provider. Make sure you discuss any questions you have with your health care provider.  Pulmonary Nodule A pulmonary nodule is a small, round growth of tissue in the lung. Pulmonary nodules can range in size from less than 1/5 inch (4 mm) to a little bigger than an inch (25 mm). Most pulmonary nodules are detected when imaging  tests of the lung are being performed for a different problem. Pulmonary nodules are usually not cancerous (benign). However, some pulmonary nodules are cancerous (malignant). Follow-up treatment or testing is based on the size of the pulmonary nodule and your risk of getting lung cancer.  CAUSES Benign pulmonary nodules can be caused by various things. Some of the causes include:  Bacterial, fungal, or viral infections. This is usually an old infection that is no longer active, but it can sometimes be a current, active infection. A benign mass of tissue. Inflammation from conditions such as rheumatoid arthritis.  Abnormal blood vessels in the lungs. Malignant pulmonary nodules can result from lung cancer or from cancers that spread to the lung from other places in the  body. SIGNS AND SYMPTOMS Pulmonary nodules usually do not cause symptoms. DIAGNOSIS Most often, pulmonary nodules are found incidentally when an X-ray or CT scan is performed to look for some other problem in the lung area. To help determine whether a pulmonary nodule is benign or malignant, your health care provider will take a medical history and order a variety of tests. Tests done may include:  Blood tests. A skin test called a tuberculin test. This test is used to determine if you have been exposed to the germ that causes tuberculosis.  Chest X-rays. If possible, a new X-ray may be compared with X-rays you have had in the past.  CT scan. This test shows smaller pulmonary nodules more clearly than an X-ray.  Positron emission tomography (PET) scan. In this test, a safe amount of a radioactive substance is injected into the bloodstream. Then, the scan takes a picture of the pulmonary nodule. The radioactive substance is eliminated from your body in your urine.  Biopsy. A tiny piece of the pulmonary nodule is removed so it can be checked under a microscope. TREATMENT  Pulmonary nodules that are benign normally do not require any treatment because they usually do not cause symptoms or breathing problems. Your health care provider may want to monitor the pulmonary nodule through follow-up CT scans. The frequency of these CT scans will vary based on the size of the nodule and the risk factors for lung cancer. For example, CT scans will need to be done more frequently if the pulmonary nodule is larger and if you have a history of smoking and a family history of cancer. Further testing or biopsies may be done if any follow-up CT scan shows that the size of the pulmonary nodule has increased. HOME CARE INSTRUCTIONS Only take over-the-counter or prescription medicines as directed by your health care provider. Keep all follow-up appointments with your health care provider. SEEK MEDICAL CARE IF: You  have trouble breathing when you are active.  You feel sick or unusually tired.  You do not feel like eating.  You lose weight without trying to.  You develop chills or night sweats.  SEEK IMMEDIATE MEDICAL CARE IF: You cannot catch your breath, or you begin wheezing.  You cannot stop coughing.  You cough up blood.  You become dizzy or feel like you are going to pass out.  You have sudden chest pain.  You have a fever or persistent symptoms for more than 2-3 days.  You have a fever and your symptoms suddenly get worse. MAKE SURE YOU: Understand these instructions. Will watch your condition. Will get help right away if you are not doing well or get worse. Document Released: 07/18/2009 Document Revised: 05/23/2013 Document Reviewed: 03/12/2013 [employer] Patient Information 2015  [employer]. This information is not intended to replace advice given to you by your health care provider. Make sure you discuss any questions you have with your health care provider.

## 2014-08-26 NOTE — ED Notes (Signed)
Pt states she had CP on Friday when she got upset with her daughter. Pain has lessened since that time but is still there. Also c/o headache and blurred vision not resolved by OTC meds.

## 2014-08-26 NOTE — ED Provider Notes (Signed)
CSN: [id number]     Arrival date & time 08/26/14  1511 History   First MD Initiated Contact with Patient 08/26/14 1555     Chief Complaint  Patient presents with  . Headache  . Chest Pain   Terri Sparks is a 38 y.o. female with history of anxiety, PE, and GERD who presents to the ED complaining of substernal chest pain that began 4 days ago after and argument with her daughter. She reports her pain is improved somewhat since onset it has persisted for the past 4 days. She rates her chest pain at 7 out of 10 pain is worse with touching and with deep breath. She denies currently being short of breath. She also reports an associated generalized headache that she rates at a 10 out of 10 with associated photophobia and blurry vision. She reports feeling nauseated, denies vomiting. Patient with foreign remote exams every prevents exam this morning without relief. Patient has 20-pack-year smoking history as well as hypertension. She reports family history of heart problems other father had a heart attack when he was 17. She has personal history of heart problems. She denies fevers, chills, numbness, weakness, tingling, palpitations, current shortness of breath, vomiting, diarrhea, neck stiffness, or difficulty walking.  (Consider location/radiation/quality/duration/timing/severity/associated sxs/prior Treatment) HPI  Past Medical History  Diagnosis Date  . History of blood clots   . GERD (gastroesophageal reflux disease)   . Renal calculi   . Hyperparathyroidism   . Hypercalcemia   . Anxiety   . Nephrolithiasis    Past Surgical History  Procedure Laterality Date  . Tubal ligation    . 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 No pertinent family history. History  Substance Use Topics  . Smoking status: Current Every Day Smoker  . Smokeless tobacco: Not on file  . Alcohol Use: No   OB History    No data available     Review of Systems  Constitutional: Negative for  fever, chills, appetite change and fatigue.  HENT: Negative for congestion, ear pain, facial swelling, hearing loss, sore throat and trouble swallowing.   Eyes: Positive for photophobia and visual disturbance. Negative for pain.  Respiratory: Negative for cough, shortness of breath and wheezing.   Cardiovascular: Positive for chest pain. Negative for palpitations and leg swelling.  Gastrointestinal: Positive for nausea. Negative for vomiting, abdominal pain and diarrhea.  Genitourinary: Negative for dysuria, hematuria and difficulty urinating.  Musculoskeletal: Negative for back pain and neck pain.  Skin: Negative for pallor, rash and wound.  Neurological: Positive for headaches. Negative for dizziness, seizures, syncope, speech difficulty, weakness, light-headedness and numbness.  All other systems reviewed and are negative.     Allergies  Macrobid and Sulfa antibiotics  Home Medications   Prior to Admission medications   Medication Sig Start Date End Date Taking? Authorizing Provider  acetaminophen (TYLENOL) 500 MG tablet Take 1,000 mg by mouth every 6 (six) hours as needed (pain).    Yes Historical Provider, MD  ibuprofen (ADVIL,MOTRIN) 200 MG tablet Take 400 mg by mouth every 6 (six) hours as needed for moderate pain (pain).    Yes Historical Provider, MD  naproxen sodium (ANAPROX) 220 MG tablet Take 220 mg by mouth 2 (two) times daily with a meal.   Yes Historical Provider, MD  cephALEXin (KEFLEX) 500 MG capsule Take 2 capsules (1,000 mg total) by mouth 2 (two) times daily. Patient not taking: Reported on 08/26/2014 03/04/14   Resa Miner Lawyer, PA-C  cyclobenzaprine (  FLEXERIL) 10 MG tablet Take 1 tablet (10 mg total) by mouth 3 (three) times daily as needed for muscle spasms. Patient not taking: Reported on 08/26/2014 03/04/14   Resa Miner Lawyer, PA-C  HYDROcodone-acetaminophen (NORCO/VICODIN) 5-325 MG per tablet Take 1 tablet by mouth every 6 (six) hours as needed for moderate  pain. Patient not taking: Reported on 08/26/2014 03/04/14   Resa Miner Lawyer, PA-C  naproxen (NAPROSYN) 500 MG tablet Take 1 tablet (500 mg total) by mouth 2 (two) times daily with a meal. 08/26/14   Verda Cumins Shanai Lartigue, PA-C  predniSONE (DELTASONE) 50 MG tablet Take 1 tablet (50 mg total) by mouth daily. Patient not taking: Reported on 08/26/2014 03/04/14   Resa Miner Lawyer, PA-C   BP 133/74 mmHg  Pulse 92  Temp(Src) 98.8 F (37.1 C) (Oral)  Resp 15  SpO2 93%  LMP 08/24/2014 (Exact Date) Physical Exam  Constitutional: She is oriented to person, place, and time. She appears well-developed and well-nourished. No distress.  HENT:  Head: Normocephalic and atraumatic.  Right Ear: External ear normal.  Left Ear: External ear normal.  Nose: Nose normal.  Mouth/Throat: Oropharynx is clear and moist. No oropharyngeal exudate.  Eyes: Conjunctivae and EOM are normal. Pupils are equal, round, and reactive to light. Right eye exhibits no discharge. Left eye exhibits no discharge.  Neck: Normal range of motion. Neck supple.  Cardiovascular: Regular rhythm, normal heart sounds and intact distal pulses.  Exam reveals no gallop and no friction rub.   No murmur heard. Tachycardic at 108  Pulmonary/Chest: Effort normal and breath sounds normal. No respiratory distress. She has no wheezes. She has no rales. She exhibits tenderness.  Abdominal: Soft. She exhibits no distension. There is no tenderness.  Musculoskeletal: She exhibits no edema.  Patient is able to ambulate without difficulty or assistance.  Lymphadenopathy:    She has no cervical adenopathy.  Neurological: She is alert and oriented to person, place, and time. She displays normal reflexes. No cranial nerve deficit. She exhibits normal muscle tone. Coordination normal.  Cranial nerves II through XII intact bilaterally. Patient able to ambulate in the room without difficulty or assistance. Patient has 5 out of 5 strength in his  bilateral upper and lower extremities. Negative pronator drift. Rapid alternating movements intact bilaterally. Sensation intact bilaterally. Bilateral patellar DTRs are intact.   Skin: Skin is warm and dry. No rash noted. She is not diaphoretic. No erythema. No pallor.  Psychiatric: She has a normal mood and affect. Her behavior is normal.  Nursing note and vitals reviewed.   ED Course  Procedures (including critical care time) Labs Review Labs Reviewed  CBC - Abnormal; Notable for the following:    WBC 15.9 (*)    Hemoglobin 15.6 (*)    HCT 47.0 (*)    All other components within normal limits  BASIC METABOLIC PANEL - Abnormal; Notable for the following:    Glucose, Bld 245 (*)    Creatinine, Ser 1.21 (*)    GFR calc non Af Amer 56 (*)    GFR calc Af Amer 64 (*)    All other components within normal limits  TROPONIN I  POC URINE PREG, ED  Randolm Idol, ED    Imaging Review Dg Chest 2 View  08/26/2014   CLINICAL DATA:  Left-sided chest pain and headaches, initial encounter  EXAM: CHEST  2 VIEW  COMPARISON:  None.  FINDINGS: The heart size and mediastinal contours are within normal limits. Both lungs are clear.  The visualized skeletal structures are unremarkable.  IMPRESSION: No active cardiopulmonary disease.   Electronically Signed   By: Inez Catalina M.D.   On: 08/26/2014 16:58   Ct Head Wo Contrast  08/26/2014   CLINICAL DATA:  Headaches for 2 weeks, initial encounter  EXAM: CT HEAD WITHOUT CONTRAST  TECHNIQUE: Contiguous axial images were obtained from the base of the skull through the vertex without intravenous contrast.  COMPARISON:  None.  FINDINGS: Bony calvarium is intact. No gross soft tissue abnormality is noted. A piercing is noted in right eyebrow. No findings to suggest acute hemorrhage, acute infarction or space-occupying mass lesion are noted.  IMPRESSION: No acute abnormality noted.   Electronically Signed   By: Inez Catalina M.D.   On: 08/26/2014 17:51   Ct  Angio Chest Pe W/cm &/or Wo Cm  08/26/2014   CLINICAL DATA:  Chest pain  EXAM: CT ANGIOGRAPHY CHEST WITH CONTRAST  TECHNIQUE: Multidetector CT imaging of the chest was performed using the standard protocol during bolus administration of intravenous contrast. Multiplanar CT image reconstructions and MIPs were obtained to evaluate the vascular anatomy.  CONTRAST:  137mL OMNIPAQUE IOHEXOL 350 MG/ML SOLN  COMPARISON:  Chest radiograph 08/26/2014.  FINDINGS: Heart size is borderline enlarged. Thoracic aorta is normal in caliber and contour. Negative for aortic dissection or aneurysm. No vascular atherosclerotic changes are appreciated. Negative for thoracic lymphadenopathy, pleural effusion, or pericardial effusion.  There is a small hiatal hernia.  This is a technically satisfactory evaluation of the pulmonary arterial tree. No focal filling defects are identified in the pulmonary artery branches.  There are two pulmonary nodules in the right lobe, both adjacent to the major fissure. One nodule measures 7 mm on image number 39. The second measures 6 mm on image number 40. There are no prior chest CTs for comparison to assess for stability over time.  Negative for airspace disease, interstitial abnormality, or pneumothorax. The trachea and mainstem bronchi are patent.  No acute suspicious findings are seen in the imaged portion of the upper abdomen.  Thoracic spine vertebral bodies are normal in height and alignment. No acute or suspicious bony abnormality.  Review of the MIP images confirms the above findings.  IMPRESSION: 1. Two right lower lobe pulmonary nodules measure 6 and 7 mm, respectively. If the patient is at high risk for bronchogenic carcinoma, follow-up chest CT at 3-6 months is recommended. If the patient is at low risk for bronchogenic carcinoma, follow-up chest CT at 6-12 months is recommended. This recommendation follows the consensus statement: Guidelines for Management of Small Pulmonary Nodules  Detected on CT Scans: A Statement from the [employer] as published in Radiology 2005; 237:395-400. 2. Negative for pulmonary embolism. 3. Borderline cardiomegaly. 4. Small hiatal hernia.   Electronically Signed   By: Curlene Dolphin M.D.   On: 08/26/2014 18:01     EKG Interpretation   Date/Time:  Monday August 26 2014 15:32:54 EST Ventricular Rate:  107 PR Interval:  126 QRS Duration: 72 QT Interval:  324 QTC Calculation: 432 R Axis:   70 Text Interpretation:  Sinus tachycardia Atrial premature complex Baseline  wander in lead(s) II nonspecific t wave changes no longer present  Confirmed by [facility]  MD, TREY ([id number]) on 08/26/2014 4:51:15 PM      Filed Vitals:   08/26/14 1528 08/26/14 1904 08/26/14 1930 08/26/14 2000  BP: 141/91 119/67 118/70 133/74  Pulse: 117 88 87 92  Temp: 98.8 F (37.1 C)     TempSrc:  Oral     Resp: 20 21 17 15   SpO2: 100% 93% 92% 93%     MDM   Meds given in ED:  Medications  morphine 4 MG/ML injection 4 mg (4 mg Intravenous Given 08/26/14 1706)  ondansetron (ZOFRAN) injection 4 mg (4 mg Intravenous Given 08/26/14 1704)  diphenhydrAMINE (BENADRYL) injection 25 mg (25 mg Intravenous Given 08/26/14 1714)  metoCLOPramide (REGLAN) injection 10 mg (10 mg Intravenous Given 08/26/14 1754)  dexamethasone (DECADRON) injection 10 mg (10 mg Intravenous Given 08/26/14 1754)  sodium chloride 0.9 % bolus 1,000 mL (0 mLs Intravenous Stopped 08/26/14 1908)  iohexol (OMNIPAQUE) 350 MG/ML injection 100 mL (100 mLs Intravenous Contrast Given 08/26/14 1735)    New Prescriptions   NAPROXEN (NAPROSYN) 500 MG TABLET    Take 1 tablet (500 mg total) by mouth 2 (two) times daily with a meal.    Final diagnoses:  Headache  Atypical chest pain   18:50 Patient reports her HA has improved greatly and is now only 2/10. She denies current chest pain. States she is feeling much better. Will speak with Dr. Doy Mince prior to likely discharge.   20:05 Patient is sleeping  in the room. She denies headache or chest pain. She reports feeling much better and is ready to go home. Discussed with patient about imaging results. There is no evidence of PE. There was two right lower lobe pulmonary nodules measuring 6 and 7 mm. The patient is a smoker and at high risk for lung cancer. I advised that she needed to follow-up with her PCP and will need a follow-up chest CT to follow these nodules. She understands and agrees.  The patient has some decreased GFR but this is no different from her previous BMPs. Chest x-ray was unremarkable. Head CT was unremarkable. Still waiting on a second troponin.  Troponin is negative x2. The patient is neurologically intact nontoxic-appearing. Patient is afebrile. The patient has no longer tachycardic. Her heart rate is now 88. Patient education provided on headaches and warning signs and when to return to the ED. Patient feels ready to be discharged. I advised the patient to follow-up with her primary care physician in 3 days. Advised patient to follow-up with pulmonology this week. Advised patient to return to the ED with new or worsening symptoms, new chest pain, shortness of breath, worsening headache concerns. The patient verbalized understanding and agreement with plan.  Patient was discussed with and evaluated by Dr. Doy Mince who agrees with assessment and plan.     Hanley Hays, PA-C 08/26/14 2123  Artis Delay, MD 08/27/14 2245

## 2014-09-04 ENCOUNTER — Encounter (HOSPITAL_COMMUNITY): Payer: Self-pay | Admitting: Emergency Medicine

## 2014-09-04 ENCOUNTER — Emergency Department (HOSPITAL_COMMUNITY)
Admission: EM | Admit: 2014-09-04 | Discharge: 2014-09-04 | Discharge status: Against Medical Advice | Payer: Medicaid Other | Attending: Emergency Medicine | Admitting: Emergency Medicine

## 2014-09-04 DIAGNOSIS — Z720 Tobacco use: Secondary | ICD-10-CM | POA: Insufficient documentation

## 2014-09-04 DIAGNOSIS — M79605 Pain in left leg: Secondary | ICD-10-CM | POA: Insufficient documentation

## 2014-09-04 DIAGNOSIS — R51 Headache: Secondary | ICD-10-CM | POA: Diagnosis present

## 2014-09-04 NOTE — ED Notes (Signed)
Pt c/o headaches x 4 wks.  States that it has not gotten better.  Denies NVD.  Pt states that she also wants to get her "leg checked out".  Was dx w/ sciatica.  C/o lt leg pain x 1 year.  Pt also brought her daughter in as a patient for an unrelated issue.

## 2014-09-04 NOTE — ED Notes (Signed)
Pt reported to registration that she was leaving and would return later.

## 2014-12-28 ENCOUNTER — Emergency Department (HOSPITAL_COMMUNITY): Payer: Medicaid Other

## 2014-12-28 ENCOUNTER — Emergency Department (HOSPITAL_COMMUNITY)
Admission: EM | Admit: 2014-12-28 | Discharge: 2014-12-28 | Disposition: A | Discharge status: Home / Self Care | Payer: Medicaid Other | Attending: Emergency Medicine | Admitting: Emergency Medicine

## 2014-12-28 ENCOUNTER — Encounter (HOSPITAL_COMMUNITY): Payer: Self-pay | Admitting: *Deleted

## 2014-12-28 DIAGNOSIS — Z8659 Personal history of other mental and behavioral disorders: Secondary | ICD-10-CM | POA: Diagnosis not present

## 2014-12-28 DIAGNOSIS — Z720 Tobacco use: Secondary | ICD-10-CM | POA: Insufficient documentation

## 2014-12-28 DIAGNOSIS — Z3202 Encounter for pregnancy test, result negative: Secondary | ICD-10-CM | POA: Diagnosis not present

## 2014-12-28 DIAGNOSIS — Z7952 Long term (current) use of systemic steroids: Secondary | ICD-10-CM | POA: Diagnosis not present

## 2014-12-28 DIAGNOSIS — R112 Nausea with vomiting, unspecified: Secondary | ICD-10-CM | POA: Insufficient documentation

## 2014-12-28 DIAGNOSIS — N3000 Acute cystitis without hematuria: Secondary | ICD-10-CM | POA: Diagnosis not present

## 2014-12-28 DIAGNOSIS — Z8719 Personal history of other diseases of the digestive system: Secondary | ICD-10-CM | POA: Insufficient documentation

## 2014-12-28 DIAGNOSIS — Z87442 Personal history of urinary calculi: Secondary | ICD-10-CM | POA: Insufficient documentation

## 2014-12-28 DIAGNOSIS — Z792 Long term (current) use of antibiotics: Secondary | ICD-10-CM | POA: Diagnosis not present

## 2014-12-28 DIAGNOSIS — Z8639 Personal history of other endocrine, nutritional and metabolic disease: Secondary | ICD-10-CM | POA: Diagnosis not present

## 2014-12-28 DIAGNOSIS — Z791 Long term (current) use of non-steroidal anti-inflammatories (NSAID): Secondary | ICD-10-CM | POA: Diagnosis not present

## 2014-12-28 DIAGNOSIS — R197 Diarrhea, unspecified: Secondary | ICD-10-CM

## 2014-12-28 DIAGNOSIS — Z79899 Other long term (current) drug therapy: Secondary | ICD-10-CM | POA: Diagnosis not present

## 2014-12-28 DIAGNOSIS — R05 Cough: Secondary | ICD-10-CM | POA: Diagnosis not present

## 2014-12-28 DIAGNOSIS — Z86718 Personal history of other venous thrombosis and embolism: Secondary | ICD-10-CM | POA: Insufficient documentation

## 2014-12-28 DIAGNOSIS — R1031 Right lower quadrant pain: Secondary | ICD-10-CM | POA: Diagnosis present

## 2014-12-28 LAB — URINALYSIS, ROUTINE W REFLEX MICROSCOPIC
Bilirubin Urine: NEGATIVE
Glucose, UA: NEGATIVE mg/dL
Ketones, ur: NEGATIVE mg/dL
Nitrite: POSITIVE — AB
Protein, ur: NEGATIVE mg/dL
Specific Gravity, Urine: 1.013 (ref 1.005–1.030)
Urobilinogen, UA: 0.2 mg/dL (ref 0.0–1.0)
pH: 6 (ref 5.0–8.0)

## 2014-12-28 LAB — COMPREHENSIVE METABOLIC PANEL
ALT: 12 U/L (ref 0–35)
AST: 14 U/L (ref 0–37)
Albumin: 3.6 g/dL (ref 3.5–5.2)
Alkaline Phosphatase: 82 U/L (ref 39–117)
Anion gap: 9 (ref 5–15)
BUN: 10 mg/dL (ref 6–23)
CO2: 25 mmol/L (ref 19–32)
Calcium: 9.3 mg/dL (ref 8.4–10.5)
Chloride: 105 mmol/L (ref 96–112)
Creatinine, Ser: 1.14 mg/dL — ABNORMAL HIGH (ref 0.50–1.10)
GFR calc Af Amer: 69 mL/min — ABNORMAL LOW (ref >90)
GFR calc non Af Amer: 60 mL/min — ABNORMAL LOW (ref >90)
Glucose, Bld: 171 mg/dL — ABNORMAL HIGH (ref 70–99)
Potassium: 4.1 mmol/L (ref 3.5–5.1)
Sodium: 139 mmol/L (ref 135–145)
Total Bilirubin: 0.3 mg/dL (ref 0.3–1.2)
Total Protein: 7.6 g/dL (ref 6.0–8.3)

## 2014-12-28 LAB — URINE MICROSCOPIC-ADD ON

## 2014-12-28 LAB — LIPASE, BLOOD: Lipase: 34 U/L (ref 11–59)

## 2014-12-28 LAB — I-STAT CG4 LACTIC ACID, ED: Lactic Acid, Venous: 1 mmol/L (ref 0.5–2.0)

## 2014-12-28 LAB — CBC
HCT: 43.1 % (ref 36.0–46.0)
Hemoglobin: 14 g/dL (ref 12.0–15.0)
MCH: 29.9 pg (ref 26.0–34.0)
MCHC: 32.5 g/dL (ref 30.0–36.0)
MCV: 91.9 fL (ref 78.0–100.0)
Platelets: 397 10*3/uL (ref 150–400)
RBC: 4.69 MIL/uL (ref 3.87–5.11)
RDW: 13.7 % (ref 11.5–15.5)
WBC: 10.6 10*3/uL — ABNORMAL HIGH (ref 4.0–10.5)

## 2014-12-28 LAB — POC URINE PREG, ED: Preg Test, Ur: NEGATIVE

## 2014-12-28 MED ORDER — DIPHENHYDRAMINE HCL 50 MG/ML IJ SOLN
25.0000 mg | Freq: Once | INTRAMUSCULAR | Status: AC
  Administered 2014-12-28: 25 mg via INTRAVENOUS
  Filled 2014-12-28: qty 1

## 2014-12-28 MED ORDER — HYDROMORPHONE HCL 1 MG/ML IJ SOLN
1.0000 mg | Freq: Once | INTRAMUSCULAR | Status: AC
  Administered 2014-12-28: 1 mg via INTRAVENOUS
  Filled 2014-12-28: qty 1

## 2014-12-28 MED ORDER — SODIUM CHLORIDE 0.9 % IV BOLUS (SEPSIS)
1000.0000 mL | Freq: Once | INTRAVENOUS | Status: AC
  Administered 2014-12-28: 1000 mL via INTRAVENOUS

## 2014-12-28 MED ORDER — PROMETHAZINE HCL 25 MG PO TABS: 25.0000 mg | ORAL_TABLET | Freq: Four times a day (QID) | ORAL | Status: DC | PRN

## 2014-12-28 MED ORDER — IOHEXOL 300 MG/ML  SOLN
100.0000 mL | Freq: Once | INTRAMUSCULAR | Status: AC | PRN
  Administered 2014-12-28: 100 mL via INTRAVENOUS

## 2014-12-28 MED ORDER — IOHEXOL 300 MG/ML  SOLN
25.0000 mL | Freq: Once | INTRAMUSCULAR | Status: AC | PRN
  Administered 2014-12-28: 50 mL via ORAL

## 2014-12-28 MED ORDER — HYDROCODONE-ACETAMINOPHEN 5-325 MG PO TABS: 1.0000 | ORAL_TABLET | Freq: Four times a day (QID) | ORAL | Status: DC | PRN

## 2014-12-28 MED ORDER — CIPROFLOXACIN HCL 500 MG PO TABS: 500.0000 mg | ORAL_TABLET | Freq: Two times a day (BID) | ORAL | Status: DC

## 2014-12-28 MED ORDER — DIPHENHYDRAMINE HCL 50 MG/ML IJ SOLN
25.0000 mg | Freq: Once | INTRAMUSCULAR | Status: DC
Start: 2014-12-28 — End: 2014-12-28

## 2014-12-28 MED ORDER — ONDANSETRON HCL 4 MG/2ML IJ SOLN
4.0000 mg | Freq: Once | INTRAMUSCULAR | Status: AC
  Administered 2014-12-28: 4 mg via INTRAVENOUS
  Filled 2014-12-28: qty 2

## 2014-12-28 MED ORDER — DEXTROSE 5 % IV SOLN
1.0000 g | Freq: Once | INTRAVENOUS | Status: AC
  Administered 2014-12-28: 1 g via INTRAVENOUS
  Filled 2014-12-28: qty 10

## 2014-12-28 NOTE — Discharge Instructions (Signed)

## 2014-12-28 NOTE — ED Notes (Signed)
Pt reports n/v/d "for weeks." unable to keep fluids or food down. Sts she began having RLQ pain 1.5 days ago, intermittent.

## 2014-12-28 NOTE — ED Notes (Signed)
Two unsuccessful IV attempts by this RN  

## 2014-12-28 NOTE — ED Provider Notes (Signed)
CSN: [id number]     Arrival date & time 12/28/14  1403 History   First MD Initiated Contact with Patient 12/28/14 1459     Chief Complaint  Patient presents with  . Abdominal Pain  . Nausea  . Vomiting  . Diarrhea     (Consider location/radiation/quality/duration/timing/severity/associated sxs/prior Treatment) HPI Comments: He here with abdominal pain that began at 3 AM yesterday. Woke her up from sleep. Right lower quadrant. No alleviating or exacerbating factors. Began as cramping, but now states it "hurts". Has had nausea, vomiting, diarrhea for the past month. She is vomiting multiple times a day. This normally happens about an hour after she begins eating. Occasional dark blood in the stool, but no hematemesis or frank hematochezia. She denies any recent travel, antibiotic use, sick contacts, other hospitalizations.  Patient is a 39 y.o. female presenting with abdominal pain and diarrhea. The history is provided by the patient.  Abdominal Pain Pain location:  RLQ Pain quality: aching   Pain radiates to:  Does not radiate Pain severity:  Moderate Onset quality:  Sudden Duration:  2 days Timing:  Constant Progression:  Unchanged Chronicity:  New Context: recent illness (has had nausea, vomiting and diarrhea for past month)   Context: not sick contacts and not trauma   Relieved by:  Nothing Worsened by:  Nothing tried Associated symptoms: cough (occasional), diarrhea and vomiting   Associated symptoms: no dysuria, no fever, no shortness of breath, no vaginal bleeding and no vaginal discharge   Diarrhea Associated symptoms: abdominal pain (began yesterday) and vomiting   Associated symptoms: no fever     Past Medical History  Diagnosis Date  . History of blood clots   . GERD (gastroesophageal reflux disease)   . Renal calculi   . Hyperparathyroidism   . Hypercalcemia   . Anxiety   . Nephrolithiasis    Past Surgical History  Procedure Laterality Date  . Tubal  ligation    . 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 History  Substance Use Topics  . Smoking status: Current Every Day Smoker  . Smokeless tobacco: Not on file  . Alcohol Use: No   OB History    No data available     Review of Systems  Constitutional: Negative for fever.  Respiratory: Positive for cough (occasional). Negative for shortness of breath.   Gastrointestinal: Positive for vomiting, abdominal pain (began yesterday) and diarrhea.  Genitourinary: Negative for dysuria, vaginal bleeding and vaginal discharge.  All other systems reviewed and are negative.     Allergies  Macrobid and Sulfa antibiotics  Home Medications   Prior to Admission medications   Medication Sig Start Date End Date Taking? Authorizing Provider  Ascorbic Acid (VITAMIN C) 1000 MG tablet Take 2,000 mg by mouth daily.   Yes Historical Provider, MD  ibuprofen (ADVIL,MOTRIN) 200 MG tablet Take 400 mg by mouth every 6 (six) hours as needed for moderate pain (pain).    Yes Historical Provider, MD  cephALEXin (KEFLEX) 500 MG capsule Take 2 capsules (1,000 mg total) by mouth 2 (two) times daily. Patient not taking: Reported on 08/26/2014 03/04/14   Dalia Heading, PA-C  cyclobenzaprine (FLEXERIL) 10 MG tablet Take 1 tablet (10 mg total) by mouth 3 (three) times daily as needed for muscle spasms. Patient not taking: Reported on 08/26/2014 03/04/14   Dalia Heading, PA-C  HYDROcodone-acetaminophen (NORCO/VICODIN) 5-325 MG per tablet Take 1 tablet by mouth every 6 (six) hours as needed for moderate  pain. Patient not taking: Reported on 08/26/2014 03/04/14   Dalia Heading, PA-C  naproxen (NAPROSYN) 500 MG tablet Take 1 tablet (500 mg total) by mouth 2 (two) times daily with a meal. 08/26/14   Waynetta Pean, PA-C  predniSONE (DELTASONE) 50 MG tablet Take 1 tablet (50 mg total) by mouth daily. Patient not taking: Reported on 08/26/2014 03/04/14   Dalia Heading, PA-C   BP  150/80 mmHg  Pulse 89  Temp(Src) 98.4 F (36.9 C) (Oral)  Resp 16  SpO2 100%  LMP 12/15/2014 Physical Exam  Constitutional: She is oriented to person, place, and time. She appears well-developed and well-nourished. No distress.  HENT:  Head: Normocephalic and atraumatic.  Mouth/Throat: Oropharynx is clear and moist.  Eyes: EOM are normal. Pupils are equal, round, and reactive to light.  Neck: Normal range of motion. Neck supple.  Cardiovascular: Normal rate and regular rhythm.  Exam reveals no friction rub.   No murmur heard. Pulmonary/Chest: Effort normal and breath sounds normal. No respiratory distress. She has no wheezes. She has no rales.  Abdominal: Soft. She exhibits no distension. There is tenderness (RLQ). There is no rebound.  Musculoskeletal: Normal range of motion. She exhibits no edema.  Neurological: She is alert and oriented to person, place, and time. No cranial nerve deficit. She exhibits normal muscle tone. Coordination normal.  Skin: No rash noted. She is not diaphoretic.  Nursing note and vitals reviewed.   ED Course  Procedures (including critical care time) Labs Review Labs Reviewed  CBC - Abnormal; Notable for the following:    WBC 10.6 (*)    All other components within normal limits  COMPREHENSIVE METABOLIC PANEL - Abnormal; Notable for the following:    Glucose, Bld 171 (*)    Creatinine, Ser 1.14 (*)    GFR calc non Af Amer 60 (*)    GFR calc Af Amer 69 (*)    All other components within normal limits  URINALYSIS, ROUTINE W REFLEX MICROSCOPIC - Abnormal; Notable for the following:    APPearance CLOUDY (*)    Hgb urine dipstick TRACE (*)    Nitrite POSITIVE (*)    Leukocytes, UA LARGE (*)    All other components within normal limits  URINE MICROSCOPIC-ADD ON - Abnormal; Notable for the following:    Bacteria, UA MANY (*)    All other components within normal limits  URINE CULTURE  LIPASE, BLOOD  I-STAT CG4 LACTIC ACID, ED  POC URINE PREG, ED     Imaging Review Ct Abdomen Pelvis W Contrast  12/28/2014   CLINICAL DATA:  Right lower quadrant pain beginning Friday. Nausea, vomiting, diarrhea. Sweats and chills.  EXAM: CT ABDOMEN AND PELVIS WITH CONTRAST  TECHNIQUE: Multidetector CT imaging of the abdomen and pelvis was performed using the standard protocol following bolus administration of intravenous contrast.  CONTRAST:  14mL OMNIPAQUE IOHEXOL 300 MG/ML SOLN, 179mL OMNIPAQUE IOHEXOL 300 MG/ML SOLN  COMPARISON:  12/16/2013  FINDINGS: Dependent atelectasis in the lung bases. Heart is normal size. No effusions.  Liver, gallbladder, spleen, pancreas, right adrenal gland are unremarkable. 16 mm nodule in the left adrenal gland. This is stable in size since prior study.  Multiple bilateral renal stones are stable. Areas of cortical thinning and scarring in the left kidney. No ureteral stones or hydronephrosis. Urinary bladder is unremarkable. Small locule of gas within the bladder presumably related to recent catheterization.  Uterus and adnexa have a normal appearance. Stomach, large and small bowel are unremarkable. No free fluid,  free air or adenopathy. Small hiatal hernia. Appendix is visualized and is normal.  No acute bony abnormality or focal bone lesion.  IMPRESSION: Normal appendix.  Multiple bilateral renal stones. No ureteral stones or hydronephrosis. Areas of cortical loss and scarring in the left kidney.  Stable left adrenal nodule.   Electronically Signed   By: Rolm Baptise M.D.   On: 12/28/2014 19:13     EKG Interpretation None      MDM   Final diagnoses:  Nausea vomiting and diarrhea  Acute cystitis without hematuria    39 year old female here with 2 days of right lower quadrant abdominal pain in the setting of one month of vomiting and diarrhea. Prior history of C-section and multiple kidney procedures, but still has her gallbladder and appendix. She is morbidly obese. On exam she has right lower quadrant pain. She denies any  GU complaints. We'll plan on CT of her abdomen, labs, fluids, pain meds.  CT negative. Labs show UTI. Given Rocephin here, Cipro to go home. Stable for discharge.  Evelina Bucy, MD 12/28/14 [phone]

## 2014-12-30 LAB — URINE CULTURE: Colony Count: >=100000

## 2014-12-31 ENCOUNTER — Telehealth (HOSPITAL_COMMUNITY): Payer: Self-pay

## 2014-12-31 NOTE — ED Notes (Signed)
Post ED Visit - Positive Culture Follow-up  Culture report reviewed by antimicrobial stewardship pharmacist: []  Wes Dulaney, Pharm.D., BCPS [x]  Heide Guile, Pharm.D., BCPS []  Alycia Rossetti, Pharm.D., BCPS []  [place], Pharm.D., BCPS, AAHIVP []  Legrand Como, Pharm.D., BCPS, AAHIVP []  Isac Sarna, Pharm.D., BCPS  Positive urine culture Treated with macrobid, organism sensitive to the same and no further patient follow-up is required at this time.  Ileene Musa 12/31/2014, 9:45 AM

## 2015-06-04 ENCOUNTER — Emergency Department (HOSPITAL_COMMUNITY)
Admission: EM | Admit: 2015-06-04 | Discharge: 2015-06-04 | Disposition: A | Discharge status: Home / Self Care | Payer: Medicaid Other | Attending: Emergency Medicine | Admitting: Emergency Medicine

## 2015-06-04 ENCOUNTER — Emergency Department (HOSPITAL_BASED_OUTPATIENT_CLINIC_OR_DEPARTMENT_OTHER)
Admit: 2015-06-04 | Discharge: 2015-06-04 | Disposition: A | Discharge status: Home / Self Care | Payer: Medicaid Other | Attending: Emergency Medicine | Admitting: Emergency Medicine

## 2015-06-04 ENCOUNTER — Encounter (HOSPITAL_COMMUNITY): Payer: Self-pay

## 2015-06-04 DIAGNOSIS — Z86718 Personal history of other venous thrombosis and embolism: Secondary | ICD-10-CM | POA: Diagnosis not present

## 2015-06-04 DIAGNOSIS — M79609 Pain in unspecified limb: Secondary | ICD-10-CM | POA: Diagnosis not present

## 2015-06-04 DIAGNOSIS — Z87442 Personal history of urinary calculi: Secondary | ICD-10-CM | POA: Diagnosis not present

## 2015-06-04 DIAGNOSIS — Z8639 Personal history of other endocrine, nutritional and metabolic disease: Secondary | ICD-10-CM | POA: Insufficient documentation

## 2015-06-04 DIAGNOSIS — Z8659 Personal history of other mental and behavioral disorders: Secondary | ICD-10-CM | POA: Insufficient documentation

## 2015-06-04 DIAGNOSIS — Z79899 Other long term (current) drug therapy: Secondary | ICD-10-CM | POA: Diagnosis not present

## 2015-06-04 DIAGNOSIS — M79662 Pain in left lower leg: Secondary | ICD-10-CM | POA: Insufficient documentation

## 2015-06-04 DIAGNOSIS — Z8719 Personal history of other diseases of the digestive system: Secondary | ICD-10-CM | POA: Insufficient documentation

## 2015-06-04 DIAGNOSIS — Z720 Tobacco use: Secondary | ICD-10-CM | POA: Insufficient documentation

## 2015-06-04 DIAGNOSIS — M79601 Pain in right arm: Secondary | ICD-10-CM | POA: Diagnosis not present

## 2015-06-04 DIAGNOSIS — M79605 Pain in left leg: Secondary | ICD-10-CM

## 2015-06-04 MED ORDER — METHOCARBAMOL 750 MG PO TABS: 750.0000 mg | ORAL_TABLET | Freq: Four times a day (QID) | ORAL | Status: DC

## 2015-06-04 MED ORDER — PROMETHAZINE HCL 25 MG/ML IJ SOLN
25.0000 mg | Freq: Once | INTRAMUSCULAR | Status: AC
  Administered 2015-06-04: 25 mg via INTRAMUSCULAR
  Filled 2015-06-04: qty 1

## 2015-06-04 MED ORDER — PREDNISONE 20 MG PO TABS: 40.0000 mg | ORAL_TABLET | Freq: Every day | ORAL | Status: DC

## 2015-06-04 MED ORDER — PROMETHAZINE HCL 25 MG PO TABS: 25.0000 mg | ORAL_TABLET | Freq: Four times a day (QID) | ORAL | Status: DC | PRN

## 2015-06-04 NOTE — ED Provider Notes (Addendum)
CSN: [id number]     Arrival date & time 06/04/15  0807 History   First MD Initiated Contact with Patient 06/04/15 [phone]     Chief Complaint  Patient presents with  . Leg Pain     (Consider location/radiation/quality/duration/timing/severity/associated sxs/prior Treatment) HPI Comments: Patient here with long-standing history of left leg pain times several months. Pain has been colicky and not associated with lower from any swelling. No PE symptoms. She also now complains of right arm tingling which she says has been chronic but worse recently. Symptoms can occur at rest or with activity. No associated headaches. No bowel or bladder issues. No back pain. Use over-the-counter medications without relief.  Patient is a 39 y.o. female presenting with leg pain. The history is provided by the patient.  Leg Pain   Past Medical History  Diagnosis Date  . History of blood clots   . GERD (gastroesophageal reflux disease)   . Renal calculi   . Hyperparathyroidism   . Hypercalcemia   . Anxiety   . Nephrolithiasis    Past Surgical History  Procedure Laterality Date  . Tubal ligation    . 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 Depression Mother   . Diabetes Father   . Heart failure Father    Social History  Substance Use Topics  . Smoking status: Current Every Day Smoker -- 0.50 packs/day for 15 years    Types: Cigarettes  . Smokeless tobacco: Never Used  . Alcohol Use: No   OB History    No data available     Review of Systems  All other systems reviewed and are negative.     Allergies  Macrobid and Sulfa antibiotics  Home Medications   Prior to Admission medications   Medication Sig Start Date End Date Taking? Authorizing Provider  Ascorbic Acid (VITAMIN C) 1000 MG tablet Take 2,000 mg by mouth daily.   Yes Historical Provider, MD  cyclobenzaprine (FLEXERIL) 10 MG tablet Take 10 mg by mouth 2 (two) times daily as needed  for muscle spasms (sleep).   Yes Historical Provider, MD  ibuprofen (ADVIL,MOTRIN) 200 MG tablet Take 400 mg by mouth every 6 (six) hours as needed for moderate pain (pain).    Yes Historical Provider, MD  ciprofloxacin (CIPRO) 500 MG tablet Take 1 tablet (500 mg total) by mouth 2 (two) times daily. One po bid x 10 days Patient not taking: Reported on 06/04/2015 12/28/14   Evelina Bucy, MD  HYDROcodone-acetaminophen (NORCO/VICODIN) 5-325 MG per tablet Take 1 tablet by mouth every 6 (six) hours as needed for moderate pain. Patient not taking: Reported on 06/04/2015 12/28/14   Evelina Bucy, MD  promethazine (PHENERGAN) 25 MG tablet Take 1 tablet (25 mg total) by mouth every 6 (six) hours as needed for nausea or vomiting. Patient not taking: Reported on 06/04/2015 12/28/14   Evelina Bucy, MD   BP 131/67 mmHg  Pulse 103  Temp(Src) 98.4 F (36.9 C) (Oral)  Resp 20  Ht 5\' 7"  (1.702 m)  Wt 250 lb (113.399 kg)  BMI 39.15 kg/m2  SpO2 99%  LMP 05/05/2015 Physical Exam  Constitutional: She is oriented to person, place, and time. She appears well-developed and well-nourished.  Non-toxic appearance. No distress.  HENT:  Head: Normocephalic and atraumatic.  Eyes: Conjunctivae, EOM and lids are normal. Pupils are equal, round, and reactive to light.  Neck: Normal range of motion. Neck supple. No tracheal deviation present.  No thyroid mass present.  Cardiovascular: Normal rate, regular rhythm and normal heart sounds.  Exam reveals no gallop.   No murmur heard. Pulmonary/Chest: Effort normal and breath sounds normal. No stridor. No respiratory distress. She has no decreased breath sounds. She has no wheezes. She has no rhonchi. She has no rales.  Abdominal: Soft. Normal appearance and bowel sounds are normal. She exhibits no distension. There is no tenderness. There is no rebound and no CVA tenderness.  Musculoskeletal: Normal range of motion. She exhibits no edema or tenderness.  Right upper extremity  without evidence of neurovascular compromise. Radial pulses 2+. Skin is normal. Sensation normal.  Left lower extremity without signs of edema. Toes are not purple. Dorsalis pedis pulse 2+. Sensation grossly intact. No calf tenderness.  Neurological: She is alert and oriented to person, place, and time. She has normal strength. No cranial nerve deficit or sensory deficit. GCS eye subscore is 4. GCS verbal subscore is 5. GCS motor subscore is 6.  Skin: Skin is warm and dry. No abrasion and no rash noted.  Psychiatric: She has a normal mood and affect. Her speech is normal and behavior is normal.  Nursing note and vitals reviewed.   ED Course  Procedures (including critical care time) Labs Review Labs Reviewed - No data to display  Imaging Review No results found. I have personally reviewed and evaluated these images and lab results as part of my medical decision-making.   EKG Interpretation None      MDM   Final diagnoses:  None    Patient will be given referral to neurology on call. No evidence of vascular compromise here. Doubt that this is DVT. Will prescribe steroids and muscle relaxants  Patient was concern about having a DVT in her left lower extremity. She also endorsed some nausea. I gave the patient had injection of Phenergan and she feels better. Her Doppler of her lower extremity was negative. She has been given neurology follow-up    Lacretia Leigh, MD 06/04/15 ID:[id number]  Lacretia Leigh, MD 06/04/15 1043

## 2015-06-04 NOTE — Progress Notes (Signed)
[facility] ED Cm consulted by ED RN, Jana Half about [facility] access pt referral for neurologist.  Cm spoke with pt after reviewing EPIC, registration information Found pcp listed as [facility] Pt further confirmed she had seen Dr Dennard Schaumann at [facility] years ago and head been "dismissed from his practice"  Pt now not aware of a pcp name Cm called and spoke with staff at Dr Jannifer Franklin office to see if pt needed pcp referral since she does not [facility] and was informed no.  Cm further spoke with Suanne Marker at [facility] billing office about need of referral. Cm provided with the NPI number for [place] [facility] [place] stated pt would be approved a one time referral to Dr Jannifer [facility] but pt needed to establish care with a doctor at [facility]. Cm informed pt of this and she states she will call [place],   States she would take the first available appt at Dr Jann[facility] office. Cm called Dr [facility] office while in room with pt to make this appt but was transferred to the new neurologist pt appt coordinator. CM left a message to include why pt referred, cm contact number, pt contact number, DOB, [facility] NPI number with information that Suanne Marker states pt can have a one time referral only.  Pending call from Dr [facility] office to pt or Cm  Cm provided pt with [place] DSS number and CM office number  Pt inquired about d/c pain medication Reviewed AVS to see EDP d/c her on robaxin and prednisone Discussed with pt She requests d/c with nausea medication Dr Zenia Resides informed Pt given a warm blanket

## 2015-06-04 NOTE — ED Notes (Signed)
Pt concerned for blood clot. Upset at discharge b/c testing not completed.  Notified MD and upon further assessment and spoken concerns, new orders given.

## 2015-06-04 NOTE — ED Notes (Signed)
Patient c/o intermittent left leg swelling and pain since May. Patient states her toes turn blue at times. Patient also c/o nausea and vomiting that started 2 weeks ago.

## 2015-06-04 NOTE — Progress Notes (Signed)
*  PRELIMINARY RESULTS* Vascular Ultrasound Left lower extremity venous duplex has been completed.  Preliminary findings: negative for DVT.  Landry Mellow, RDMS, RVT  06/04/2015, 10:07 AM

## 2015-06-04 NOTE — Discharge Instructions (Signed)
Follow-up with the neurologist if your symptoms don't improve

## 2015-06-04 NOTE — ED Notes (Signed)
Pt is A&Ox4 and ambulatory. Instruction was reviewed, she denied questions r/t dc

## 2015-06-24 ENCOUNTER — Telehealth: Payer: Self-pay

## 2015-06-24 ENCOUNTER — Ambulatory Visit: Payer: Self-pay | Admitting: Neurology

## 2015-06-24 NOTE — Telephone Encounter (Signed)
Patient did not come to a new patient appointment today. 

## 2015-06-25 ENCOUNTER — Encounter: Payer: Self-pay | Admitting: Neurology

## 2015-08-16 ENCOUNTER — Emergency Department (HOSPITAL_COMMUNITY)
Admission: EM | Admit: 2015-08-16 | Discharge: 2015-08-17 | Disposition: A | Discharge status: Home / Self Care | Payer: Medicaid Other | Attending: Emergency Medicine | Admitting: Emergency Medicine

## 2015-08-16 ENCOUNTER — Encounter (HOSPITAL_COMMUNITY): Payer: Self-pay | Admitting: Nurse Practitioner

## 2015-08-16 DIAGNOSIS — Z86718 Personal history of other venous thrombosis and embolism: Secondary | ICD-10-CM | POA: Insufficient documentation

## 2015-08-16 DIAGNOSIS — Z8639 Personal history of other endocrine, nutritional and metabolic disease: Secondary | ICD-10-CM | POA: Diagnosis not present

## 2015-08-16 DIAGNOSIS — Z720 Tobacco use: Secondary | ICD-10-CM | POA: Diagnosis not present

## 2015-08-16 DIAGNOSIS — N390 Urinary tract infection, site not specified: Secondary | ICD-10-CM | POA: Diagnosis not present

## 2015-08-16 DIAGNOSIS — Z3202 Encounter for pregnancy test, result negative: Secondary | ICD-10-CM | POA: Insufficient documentation

## 2015-08-16 DIAGNOSIS — Z8719 Personal history of other diseases of the digestive system: Secondary | ICD-10-CM | POA: Insufficient documentation

## 2015-08-16 DIAGNOSIS — Z9851 Tubal ligation status: Secondary | ICD-10-CM | POA: Insufficient documentation

## 2015-08-16 DIAGNOSIS — R7309 Other abnormal glucose: Secondary | ICD-10-CM

## 2015-08-16 DIAGNOSIS — R109 Unspecified abdominal pain: Secondary | ICD-10-CM | POA: Diagnosis present

## 2015-08-16 DIAGNOSIS — Z79899 Other long term (current) drug therapy: Secondary | ICD-10-CM | POA: Diagnosis not present

## 2015-08-16 DIAGNOSIS — R10A2 Flank pain, left side: Secondary | ICD-10-CM

## 2015-08-16 DIAGNOSIS — Z87442 Personal history of urinary calculi: Secondary | ICD-10-CM | POA: Insufficient documentation

## 2015-08-16 DIAGNOSIS — Z8659 Personal history of other mental and behavioral disorders: Secondary | ICD-10-CM | POA: Diagnosis not present

## 2015-08-16 LAB — URINALYSIS, ROUTINE W REFLEX MICROSCOPIC
Bilirubin Urine: NEGATIVE
Glucose, UA: 500 mg/dL — AB
Ketones, ur: NEGATIVE mg/dL
Nitrite: POSITIVE — AB
Protein, ur: NEGATIVE mg/dL
Specific Gravity, Urine: 1.017 (ref 1.005–1.030)
Urobilinogen, UA: 0.2 mg/dL (ref 0.0–1.0)
pH: 6 (ref 5.0–8.0)

## 2015-08-16 LAB — COMPREHENSIVE METABOLIC PANEL
ALT: 12 U/L — ABNORMAL LOW (ref 14–54)
AST: 14 U/L — ABNORMAL LOW (ref 15–41)
Albumin: 3.4 g/dL — ABNORMAL LOW (ref 3.5–5.0)
Alkaline Phosphatase: 69 U/L (ref 38–126)
Anion gap: 8 (ref 5–15)
BUN: 11 mg/dL (ref 6–20)
CO2: 25 mmol/L (ref 22–32)
Calcium: 9.1 mg/dL (ref 8.9–10.3)
Chloride: 104 mmol/L (ref 101–111)
Creatinine, Ser: 1.23 mg/dL — ABNORMAL HIGH (ref 0.44–1.00)
GFR calc Af Amer: >60 mL/min (ref >60)
GFR calc non Af Amer: 54 mL/min — ABNORMAL LOW (ref >60)
Glucose, Bld: 242 mg/dL — ABNORMAL HIGH (ref 65–99)
Potassium: 4 mmol/L (ref 3.5–5.1)
Sodium: 137 mmol/L (ref 135–145)
Total Bilirubin: 0.5 mg/dL (ref 0.3–1.2)
Total Protein: 6.5 g/dL (ref 6.5–8.1)

## 2015-08-16 LAB — I-STAT CHEM 8, ED
BUN: 10 mg/dL (ref 6–20)
Calcium, Ion: 1.21 mmol/L (ref 1.12–1.23)
Chloride: 100 mmol/L — ABNORMAL LOW (ref 101–111)
Creatinine, Ser: 1.2 mg/dL — ABNORMAL HIGH (ref 0.44–1.00)
Glucose, Bld: 231 mg/dL — ABNORMAL HIGH (ref 65–99)
HCT: 42 % (ref 36.0–46.0)
Hemoglobin: 14.3 g/dL (ref 12.0–15.0)
Potassium: 4.2 mmol/L (ref 3.5–5.1)
Sodium: 139 mmol/L (ref 135–145)
TCO2: 26 mmol/L (ref 0–100)

## 2015-08-16 LAB — URINE MICROSCOPIC-ADD ON

## 2015-08-16 LAB — CBC
HCT: 40.2 % (ref 36.0–46.0)
Hemoglobin: 13.1 g/dL (ref 12.0–15.0)
MCH: 30.3 pg (ref 26.0–34.0)
MCHC: 32.6 g/dL (ref 30.0–36.0)
MCV: 93.1 fL (ref 78.0–100.0)
Platelets: 318 10*3/uL (ref 150–400)
RBC: 4.32 MIL/uL (ref 3.87–5.11)
RDW: 13.8 % (ref 11.5–15.5)
WBC: 11.9 10*3/uL — ABNORMAL HIGH (ref 4.0–10.5)

## 2015-08-16 LAB — POC URINE PREG, ED: Preg Test, Ur: NEGATIVE

## 2015-08-16 MED ORDER — ONDANSETRON HCL 4 MG/2ML IJ SOLN
4.0000 mg | Freq: Once | INTRAMUSCULAR | Status: DC
  Filled 2015-08-16: qty 2

## 2015-08-16 MED ORDER — ONDANSETRON 8 MG PO TBDP
8.0000 mg | ORAL_TABLET | Freq: Once | ORAL | Status: AC
  Administered 2015-08-16: 8 mg via ORAL
  Filled 2015-08-16: qty 1

## 2015-08-16 NOTE — ED Notes (Signed)
Pt requesting nausea medication; RN notified.

## 2015-08-16 NOTE — ED Notes (Addendum)
Pt c/o of bilateral flank pain, localizing at the costovertebral angle, remarks on hx kidney stones, also states she is nauseated and has urinary frequency, denies emesis, dysuria or any other symptoms.

## 2015-08-17 ENCOUNTER — Emergency Department (HOSPITAL_COMMUNITY): Payer: Medicaid Other

## 2015-08-17 MED ORDER — CEPHALEXIN 500 MG PO CAPS
500.0000 mg | ORAL_CAPSULE | Freq: Once | ORAL | Status: AC
  Administered 2015-08-17: 500 mg via ORAL
  Filled 2015-08-17: qty 1

## 2015-08-17 MED ORDER — KETOROLAC TROMETHAMINE 30 MG/ML IJ SOLN
30.0000 mg | Freq: Once | INTRAMUSCULAR | Status: AC
  Administered 2015-08-17: 30 mg via INTRAVENOUS
  Filled 2015-08-17: qty 1

## 2015-08-17 MED ORDER — CEPHALEXIN 500 MG PO CAPS: 500.0000 mg | ORAL_CAPSULE | Freq: Four times a day (QID) | ORAL | Status: DC

## 2015-08-17 MED ORDER — SODIUM CHLORIDE 0.9 % IV BOLUS (SEPSIS)
1000.0000 mL | Freq: Once | INTRAVENOUS | Status: AC
  Administered 2015-08-17: 1000 mL via INTRAVENOUS

## 2015-08-17 MED ORDER — ONDANSETRON HCL 4 MG PO TABS: 4.0000 mg | ORAL_TABLET | Freq: Four times a day (QID) | ORAL | Status: DC

## 2015-08-17 MED ORDER — METOCLOPRAMIDE HCL 5 MG/ML IJ SOLN
10.0000 mg | Freq: Once | INTRAMUSCULAR | Status: AC
  Administered 2015-08-17: 10 mg via INTRAVENOUS
  Filled 2015-08-17: qty 2

## 2015-08-17 NOTE — ED Provider Notes (Signed)
CSN: [id number]     Arrival date & time 08/16/15  2234 History   First MD Initiated Contact with Patient 08/17/15 0004     Chief Complaint  Patient presents with  . Flank Pain  . Nausea     (Consider location/radiation/quality/duration/timing/severity/associated sxs/prior Treatment) Patient is a 39 y.o. female presenting with flank pain. The history is provided by the patient. No language interpreter was used.  Flank Pain  Ms. Gugel is a 39 y.o female with a history of kidney stones, anxiety, and GERD who presents with worsening left flank pain, urinary frequency, nausea, and vomiting 3 during the past week. She denies any treatment prior to arrival. She states this feels similar to her previous kidney stone pain. She mentioned that she had multiple surgeries on her left kidney but no ureteral stents placed. She has a nephrologist that she sees regularly and has appointment in 2 weeks. He denies any fever, chills, chest pain, shortness of breath, recent illness, dysuria, hematuria, diarrhea, or constipation.  Past Medical History  Diagnosis Date  . History of blood clots   . GERD (gastroesophageal reflux disease)   . Renal calculi   . Hyperparathyroidism   . Hypercalcemia   . Anxiety   . Nephrolithiasis    Past Surgical History  Procedure Laterality Date  . Tubal ligation    . 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 Depression Mother   . Diabetes Father   . Heart failure Father    Social History  Substance Use Topics  . Smoking status: Current Every Day Smoker -- 0.50 packs/day for 15 years    Types: Cigarettes  . Smokeless tobacco: Never Used  . Alcohol Use: No   OB History    No data available     Review of Systems  Genitourinary: Positive for flank pain.  All other systems reviewed and are negative.     Allergies  Macrobid and Sulfa antibiotics  Home Medications   Prior to Admission medications    Medication Sig Start Date End Date Taking? Authorizing Provider  Ascorbic Acid (VITAMIN C) 1000 MG tablet Take 2,000 mg by mouth daily.   Yes Historical Provider, MD  ibuprofen (ADVIL,MOTRIN) 200 MG tablet Take 400 mg by mouth every 6 (six) hours as needed for moderate pain (pain).    Yes Historical Provider, MD  naproxen sodium (ANAPROX) 220 MG tablet Take 220 mg by mouth 2 (two) times daily as needed (pain).   Yes Historical Provider, MD  cephALEXin (KEFLEX) 500 MG capsule Take 1 capsule (500 mg total) by mouth 4 (four) times daily. 08/17/15   Sequita Wise Patel-Mills, PA-C  methocarbamol (ROBAXIN-750) 750 MG tablet Take 1 tablet (750 mg total) by mouth 4 (four) times daily. Patient not taking: Reported on 08/17/2015 06/04/15   Lacretia Leigh, MD  ondansetron (ZOFRAN) 4 MG tablet Take 1 tablet (4 mg total) by mouth every 6 (six) hours. 08/17/15   Deziyah Arvin Patel-Mills, PA-C  predniSONE (DELTASONE) 20 MG tablet Take 2 tablets (40 mg total) by mouth daily. Patient not taking: Reported on 08/17/2015 06/04/15   Lacretia Leigh, MD  promethazine (PHENERGAN) 25 MG tablet Take 1 tablet (25 mg total) by mouth every 6 (six) hours as needed for nausea or vomiting. Patient not taking: Reported on 08/17/2015 06/04/15   Lacretia Leigh, MD   BP 114/53 mmHg  Pulse 84  Temp(Src) 98.3 F (36.8 C) (Oral)  Resp 18  Ht 5\' 7"  (  1.702 m)  Wt 249 lb (112.946 kg)  BMI 38.99 kg/m2  SpO2 98%  LMP  Physical Exam  Constitutional: She is oriented to person, place, and time. She appears well-developed and well-nourished.  HENT:  Head: Normocephalic and atraumatic.  Moist mucous membranes. No skin tenting.  Eyes: Conjunctivae are normal.  Neck: Normal range of motion. Neck supple.  Cardiovascular: Normal rate, regular rhythm and normal heart sounds.   Pulmonary/Chest: Effort normal and breath sounds normal. No respiratory distress. She has no wheezes. She has no rales.  Abdominal: Soft. She exhibits no distension. There is no  tenderness. There is no rebound.  Left-sided CVA tenderness to palpation. No abdominal tenderness to palpation. Abdomen is soft and obese. No abdominal distention. No guarding or rebound.   Musculoskeletal: Normal range of motion.  Neurological: She is alert and oriented to person, place, and time.  Skin: Skin is warm and dry.  Psychiatric: She has a normal mood and affect.  Nursing note and vitals reviewed.   ED Course  Procedures (including critical care time) Labs Review Labs Reviewed  COMPREHENSIVE METABOLIC PANEL - Abnormal; Notable for the following:    Glucose, Bld 242 (*)    Creatinine, Ser 1.23 (*)    Albumin 3.4 (*)    AST 14 (*)    ALT 12 (*)    GFR calc non Af Amer 54 (*)    All other components within normal limits  CBC - Abnormal; Notable for the following:    WBC 11.9 (*)    All other components within normal limits  URINALYSIS, ROUTINE W REFLEX MICROSCOPIC (NOT AT [facility]) - Abnormal; Notable for the following:    APPearance TURBID (*)    Glucose, UA 500 (*)    Hgb urine dipstick SMALL (*)    Nitrite POSITIVE (*)    Leukocytes, UA LARGE (*)    All other components within normal limits  URINE MICROSCOPIC-ADD ON - Abnormal; Notable for the following:    Squamous Epithelial / LPF MANY (*)    Bacteria, UA MANY (*)    All other components within normal limits  I-STAT CHEM 8, ED - Abnormal; Notable for the following:    Chloride 100 (*)    Creatinine, Ser 1.20 (*)    Glucose, Bld 231 (*)    All other components within normal limits  URINE CULTURE  POC URINE PREG, ED    Imaging Review Ct Renal Stone Study  08/17/2015  CLINICAL DATA:  Acute onset of bilateral flank pain and nausea. Increased urinary frequency. Initial encounter. EXAM: CT ABDOMEN AND PELVIS WITHOUT CONTRAST TECHNIQUE: Multidetector CT imaging of the abdomen and pelvis was performed following the standard protocol without IV contrast. COMPARISON:  CT of the abdomen and pelvis from 12/28/2014  FINDINGS: The visualized lung bases are clear. The liver and spleen are unremarkable in appearance. The gallbladder is within normal limits. The pancreas is unremarkable. A 1.5 cm left adrenal adenoma is noted, stable from the prior study. Scattered scarring is noted about the left kidney, with parenchymal calcifications and scattered bilateral renal stones, measuring up to 1.1 cm in size. There is no evidence of hydronephrosis. No obstructing ureteral stones are identified. No significant perinephric stranding is appreciated. No free fluid is identified. The small bowel is unremarkable in appearance. The stomach is within normal limits. No acute vascular abnormalities are seen. Minimal calcification is noted at the aortic bifurcation. The appendix is normal in caliber and contains air, without evidence of appendicitis. The  colon is unremarkable in appearance. The bladder is mildly distended and grossly unremarkable. The uterus is unremarkable in appearance. The ovaries are relatively symmetric. No suspicious adnexal masses are seen. No inguinal lymphadenopathy is seen. No acute osseous abnormalities are identified. IMPRESSION: 1. No acute abnormality seen within the abdomen or pelvis. 2. Scattered bilateral renal stones, measuring up to 1.1 cm in size. Scarring about the left kidney. No evidence of hydronephrosis. 3. 1.5 cm left adrenal adenoma noted, stable from the prior study. Electronically Signed   By: Garald Balding M.D.   On: 08/17/2015 00:57   I have personally reviewed and evaluated these images and lab results as part of my medical decision-making.   EKG Interpretation None      MDM   Final diagnoses:  Left flank pain  Urinary tract infection, acute  Elevated glucose   Patient presents for left flank pain and urinary frequency 1 week. Her glucose is elevated at 242 and has been persistently elevated since November 2015. She states she was not aware of this and will follow-up with her  primary care physician. I discussed that she has a UTI. This may be causing her symptoms but it could also be that her glucose is elevated.  CT renal stone study is negative for any acute abnormality in the abdomen or pelvis. There are scattered bilateral renal stones measuring up to 1.1 cm but no hydronephrosis. I discussed this with the patient and unexplained that she can take Tylenol for pain. I also discussed that I would not be giving her narcotic pain medications. She was given Keflex to go home with. I also gave her Zofran for nausea. I explained that she should keep her nephrology appointment in 2 weeks. I also discussed following up with a primary care physician and gave her resource guide to have her A1c checked as well as her blood sugar. Medications  ondansetron (ZOFRAN-ODT) disintegrating tablet 8 mg (8 mg Oral Given 08/16/15 2356)  sodium chloride 0.9 % bolus 1,000 mL (1,000 mLs Intravenous New Bag/Given 08/17/15 0108)  cephALEXin (KEFLEX) capsule 500 mg (500 mg Oral Given 08/17/15 0109)  ketorolac (TORADOL) 30 MG/ML injection 30 mg (30 mg Intravenous Given 08/17/15 0109)  metoCLOPramide (REGLAN) injection 10 mg (10 mg Intravenous Given 08/17/15 0109)   Filed Vitals:   08/17/15 0106  BP: 114/53  Pulse: 84  Temp: 98.3 F (36.8 C)  Resp: [address] PA-C 08/17/15 0123  April Palumbo, MD 08/17/15 [phone]

## 2015-08-17 NOTE — Discharge Instructions (Signed)
Urinary Tract Infection Follow up with your primary care physician or use the resource guide below to find one.  Take keflex as prescribed for UTI. Zofran for nausea. Have your blood glucose and A1C checked by your pcp.  A urinary tract infection (UTI) can occur any place along the urinary tract. The tract includes the kidneys, ureters, bladder, and urethra. A type of germ called bacteria often causes a UTI. UTIs are often helped with antibiotic medicine.  HOME CARE   If given, take antibiotics as told by your doctor. Finish them even if you start to feel better.  Drink enough fluids to keep your pee (urine) clear or pale yellow.  Avoid tea, drinks with caffeine, and bubbly (carbonated) drinks.  Pee often. Avoid holding your pee in for a long time.  Pee before and after having sex (intercourse).  Wipe from front to back after you poop (bowel movement) if you are a woman. Use each tissue only once. GET HELP RIGHT AWAY IF:   You have back pain.  You have lower belly (abdominal) pain.  You have chills.  You feel sick to your stomach (nauseous).  You throw up (vomit).  Your burning or discomfort with peeing does not go away.  You have a fever.  Your symptoms are not better in 3 days. MAKE SURE YOU:   Understand these instructions.  Will watch your condition.  Will get help right away if you are not doing well or get worse.   This information is not intended to replace advice given to you by your health care provider. Make sure you discuss any questions you have with your health care provider.   Document Released: 03/08/2008 Document Revised: 10/11/2014 Document Reviewed: 04/20/2012 Elsevier Interactive Patient Education 2016 [employer].  Hyperglycemia High blood sugar (hyperglycemia) means that the level of sugar in your blood is higher than it should be. Signs of high blood sugar include:  Feeling thirsty.  Frequent peeing (urinating).  Feeling tired or  sleepy.  Dry mouth.  Vision changes.  Feeling weak.  Feeling hungry but losing weight.  Numbness and tingling in your hands or feet.  Headache. When you ignore these signs, your blood sugar may keep going up. These problems may get worse, and other problems may begin. HOME CARE  Check your blood sugars as told by your doctor. Write down the numbers with the date and time.  Take the right amount of insulin or diabetes pills at the right time. Write down the dose with date and time.  Refill your insulin or diabetes pills before running out.  Watch what you eat. Follow your meal plan.  Drink liquids without sugar, such as water. Check with your doctor if you have kidney or heart disease.  Follow your doctor's orders for exercise. Exercise at the same time of day.  Keep your doctor's appointments. GET HELP RIGHT AWAY IF:   You have trouble thinking or are confused.  You have fast breathing with fruity smelling breath.  You pass out (faint).  You have 2 to 3 days of high blood sugars and you do not know why.  You have chest pain.  You are feeling sick to your stomach (nauseous) or throwing up (vomiting).  You have sudden vision changes. MAKE SURE YOU:   Understand these instructions.  Will watch your condition.  Will get help right away if you are not doing well or get worse.   This information is not intended to replace advice given to  you by your health care provider. Make sure you discuss any questions you have with your health care provider.   Document Released: 07/18/2009 Document Revised: 10/11/2014 Document Reviewed: 05/27/2015 Elsevier Interactive Patient Education 2016 [employer].  Emergency Department Resource Guide 1) Find a Doctor and Pay Out of Pocket Although you won't have to find out who is covered by your insurance plan, it is a good idea to ask around and get recommendations. You will then need to call the office and see if the doctor you  have chosen will accept you as a new patient and what types of options they offer for patients who are self-pay. Some doctors offer discounts or will set up payment plans for their patients who do not have insurance, but you will need to ask so you aren't surprised when you get to your appointment.  2) Contact Your Local Health Department Not all health departments have doctors that can see patients for sick visits, but many do, so it is worth a call to see if yours does. If you don't know where your local health department is, you can check in your phone book. The CDC also has a tool to help you locate your state's health department, and many state websites also have listings of all of their local health departments.  3) Find a [facility] If your illness is not likely to be very severe or complicated, you may want to try a walk in clinic. These are popping up all over the country in pharmacies, drugstores, and shopping centers. They're usually staffed by nurse practitioners or physician assistants that have been trained to treat common illnesses and complaints. They're usually fairly quick and inexpensive. However, if you have serious medical issues or chronic medical problems, these are probably not your best option.  No Primary Care Doctor: - Call Health Connect at  [phone] - they can help you locate a primary care doctor that  accepts your insurance, provides certain services, etc. - Physician Referral Service- [phone]  Chronic Pain Problems: Organization         Address  Phone   Notes  [facility]  [phone] Patients need to be referred by their primary care doctor.   Medication Assistance: Organization         Address  Phone   Notes  [facility] Medication [facility] [address] [place], [place] [address] [phone] --Must be a resident of [facility] -- Must have NO insurance coverage whatsoever (no Medicaid/ Medicare,  etc.) -- The pt. MUST have a primary care doctor that directs their care regularly and follows them in the community   MedAssist  [phone]   [employer]  [phone]    Agencies that provide inexpensive medical care: Organization         Address  Phone   Notes  [place]  [phone]   [facility]    [phone]   [facility] [place], [place] [address] [phone]   [place] [address], [place] [phone]   Planned Parenthood    [phone]   [facility]    [phone]   [place] and [place] [address], [place] Phone:  [phone], Fax:  [phone] Hours of Operation:  9 am - 6 pm, M-F.  Also accepts Medicaid/Medicare and self-pay.  [facility]  [place] for [address], [address], [place] Phone: [phone], Fax: [phone]. Hours of Operation:  8:30 am - 5:30 pm, M-F.  Also accepts Medicaid and self-pay.  [facility] [place] [address], [place] Phone: [phone]   [place], [place], [place] [phone], Ext. [phone] Mondays & Thursdays: 7-9 AM.  First 15 patients are seen on a first come, first serve basis.    [place] Providers:  Organization         Address  Phone   Notes  [facility] [address], [place] [phone] Also accepts self-pay patients.  [facility] [id number] [place], [place]  [phone]   [place], [address], [place] [phone]   [facility] [address] [place] [phone]   Lucianne Lei [address], [place]   [phone] Only accepts [place] Access [place] patients after they have their name applied to their card.   Self-Pay (no  insurance) in [facility]:  Organization         Address  Phone   Notes  Sickle Cell Patients, [facility] [place] [phone]   [facility] [place] [phone]   [facility] [place]  [place], [place], [place] [phone]   [facility]/Dr. Osei-Bonsu  [address], [place] or [address], [place] [phone] Phone number for both [place] and [place] locations is the same.  Urgent Medical and [facility] [address] [place] [phone]   [facility] [address] [place] or [address] [phone]   [facility] [address], [place] [phone], phone; [phone], fax Sees patients 1st and 3rd Saturday of every month.  Must not qualify for public or private insurance (i.e. Medicaid, Medicare, [place] Health Choice, Veterans' Benefits)  Household income should be no more than 200% of the poverty level The clinic cannot treat you if you are pregnant or think you are pregnant  Sexually transmitted diseases are not treated at the clinic.    Dental Care: Organization         Address  Phone  Notes  [facility] Department of [facility] [place] [phone] Accepts children up to age 56 who are enrolled in [place] or [place]; pregnant women with a Medicaid card; and children who have applied for Medicaid or [place] Health Choice, but were declined, whose parents can pay a reduced fee at time of service.  [facility] Department of [facility]  [address], [place] [phone] Accepts children up to age 74 who are enrolled in [place] or [place]; pregnant women with a Medicaid card; and children who have applied for Medicaid or  Health Choice, but were  declined, whose parents can pay a reduced fee at time of service.  [place] [facility] Access PROGRAM  [place] [phone] Patients are seen by appointment only. Walk-ins are not accepted. [place] will see patients 45 years of age and older. Monday - Tuesday (8am-5pm) Most Wednesdays (8:30-5pm) $30 per visit,  cash only  [employer] Adult [employer] PROGRAM  [address], [place] [phone] Patients are seen by appointment only. Walk-ins are not accepted. [place] will see patients 2 years of age and older. One Wednesday Evening (Monthly: Volunteer Based).  $30 per visit, cash only  [place]  [phone] for adults; Children under age 28, call Graduate Pediatric Dentistry at [phone]. Children aged 40-14, please call [phone] to request a pediatric application.  Dental services are provided in all areas of dental care including fillings, crowns and bridges, complete and partial dentures, implants, gum treatment, root canals, and extractions. Preventive care is also provided. Treatment is provided to both adults and children. Patients are selected via a lottery and there is often a waiting list.   [facility] [address] [place]  [phone] [email or web address]   [facility] [address] [place], [place] [phone], Ext. [phone] Second and Fourth Thursday of each month, opens at 6:30 AM; Clinic ends at 9 AM.  Patients are seen on a first-come first-served basis, and a limited number are seen during each clinic.   [facility]  [address] Hillard Danker [place], [place] [phone]   Eligibility Requirements You must have lived in [place], [place], or [place] counties for at least the last three months.   You cannot be eligible for state or federal sponsored [employer], including [employer], [place], or [employer].   You generally cannot be  eligible for healthcare insurance through your employer.    How to apply: Eligibility screenings are held every Tuesday and Wednesday afternoon from 1:00 pm until 4:00 pm. You do not need an appointment for the interview!  [facility] [address] [place], [place]   Assumption  [place] Department  [place]  [phone]    Behavioral Health Resources in the Community: Intensive Outpatient Programs Organization         Address  Phone  Notes  [place]. [address] [place], [place] [phone]   [facility] Outpatient [address] [place], [place]   ADS: Alcohol & Drug Svcs [address] [place], [place]   [place] [address],  [place], [place] or [phone]   Substance Abuse Resources Organization         Address  Phone  Notes  Alcohol and Drug Services  [phone]   [place]  [phone]   [facility]   Chinita Pester  [phone]   Residential & Outpatient Substance Abuse Program  [phone]   Psychological Services Organization         Address  Phone  Notes  [facility] [place]  [place]  [phone]   [place] [address], [place] or [phone]    Mobile Crisis Teams Organization         Address  Phone  Notes  [facility], Mobile Crisis Care Unit  [phone]   [facility]  [address] [place], [place]   [place] Levels [address] [place] [phone]    Self-Help/Support Groups Organization         Address  Phone  Notes  [facility]ental [facility]oc[facility] of [place] - variety of support groups  [place] Call for more information   Narcotics Anonymous (NA), [facility] [address], [employer] [place]  2 meetings at this location   Special educational needs teacher         Address  Phone  Notes  ASAP Residential Treatment [place],    [place]  [phone]   [facility]  [address], [place] 709643, [place], [place]   [place], [place] [phone] Admissions: 8am-3pm M-F  Incentives Substance Reading [address]    [place], [place] [phone]   [facility] [address] [place], [place], [place]   [facility] [address]  [place], [place]   Insight Programs - Intensive Outpatient [address] Kristeen [address], [place], [place]   [facility] [address].,  [place], [place] [phone] or [phone]   [facility] Services (RTS) [address] [place], [place] Accepts Medicaid  [facility] [place] [address]  [place] [phone] Substance Abuse/Addiction Treatment   [facility] Organization         Address  Phone  Notes  [facility]  [phone]   Domenic Schwab, PhD [address] Arlis Porta [place], [place]   [phone] or [phone]   Dana Stoy [place], [place] [phone]   [facility] [address], [place], [place] [phone] Insurance/Medicaid/sponsorship through [facility] and Families [address] [place]                                    [place], [place] [phone] [place] [address], [place] [phone]    Dr. Adele Schilder  [phone]   [facility] [place] Dept. 1) [address],  2) [place] 3)  [address], [place] [phone]  [phone]   [place] [phone] or [phone] (After Hours)

## 2015-08-19 LAB — URINE CULTURE: Culture: >=100000

## 2015-08-20 ENCOUNTER — Telehealth (HOSPITAL_BASED_OUTPATIENT_CLINIC_OR_DEPARTMENT_OTHER): Payer: Self-pay | Admitting: Emergency Medicine

## 2015-08-20 NOTE — Telephone Encounter (Signed)
Post ED Visit - Positive Culture Follow-up: Successful Patient Follow-Up  Culture assessed and recommendations reviewed by: []  Elenor Quinones, Pharm.D. []  Heide Guile, Pharm.D., BCPS [x]  Parks Neptune, Pharm.D. []  Alycia Rossetti, Pharm.D., BCPS []  [place], Pharm.D., BCPS, AAHIVP []  Legrand Como, Pharm.D., BCPS, AAHIVP []  Milus Glazier, Pharm.D. []  Rob Evette Doffing, Pharm.D.  Positive urine culture E. Coli  []  Patient discharged without antimicrobial prescription and treatment is now indicated [x]  Organism is resistant to prescribed ED discharge antimicrobial []  Patient with positive blood cultures  Changes discussed with ED provider: Harlene Ramus PA New antibiotic prescription stop Keflex,  Start levofloxacin 250mg  daily x 3 days  Attempting to contact pt   Hazle Nordmann 08/20/2015, 10:41 AM

## 2015-08-20 NOTE — Progress Notes (Signed)
ED Antimicrobial Stewardship Positive Culture Follow Up   Terri Sparks is an 39 y.o. female who [facility] to [facility] with a chief complaint of  Chief Complaint  Patient presents with  . Flank Pain  . Nausea    Recent Results (from the past 720 hour(s))  Urine culture     Status: None   Collection Time: 08/16/15 11:05 PM  Result Value Ref Range Status   Specimen Description URINE, CATHETERIZED  Final   Special Requests NONE  Final   Culture   Final    >=100,000 COLONIES/mL ESCHERICHIA COLI Performed at [facility]    Report Status 08/19/2015 FINAL  Final   Organism ID, Bacteria ESCHERICHIA COLI  Final      Susceptibility   Escherichia coli - MIC*    AMPICILLIN >=32 RESISTANT Resistant     CEFAZOLIN >=64 RESISTANT Resistant     CEFTRIAXONE <=1 SENSITIVE Sensitive     CIPROFLOXACIN <=0.25 SENSITIVE Sensitive     GENTAMICIN <=1 SENSITIVE Sensitive     IMIPENEM <=0.25 SENSITIVE Sensitive     NITROFURANTOIN <=16 SENSITIVE Sensitive     TRIMETH/SULFA <=20 SENSITIVE Sensitive     AMPICILLIN/SULBACTAM >=32 RESISTANT Resistant     PIP/TAZO 64 INTERMEDIATE Intermediate     * >=100,000 COLONIES/mL ESCHERICHIA COLI    [x]  Treated with cephalexin, organism resistant to prescribed antimicrobial []  Patient discharged originally without antimicrobial agent and treatment is now indicated  New antibiotic prescription: D/C Cephalexin, Initiate levofloxacin 250 mg QD X 3 days  ED Provider: Okey Regal, PA-C   Wynell Balloon 08/20/2015, 8:11 AM Infectious Diseases Pharmacist Phone# [phone]

## 2016-08-28 ENCOUNTER — Emergency Department (HOSPITAL_COMMUNITY)
Admission: EM | Admit: 2016-08-28 | Discharge: 2016-08-28 | Disposition: A | Discharge status: Home / Self Care | Payer: Self-pay | Attending: Emergency Medicine | Admitting: Emergency Medicine

## 2016-08-28 ENCOUNTER — Emergency Department (HOSPITAL_COMMUNITY): Payer: Self-pay

## 2016-08-28 ENCOUNTER — Encounter (HOSPITAL_COMMUNITY): Payer: Self-pay

## 2016-08-28 DIAGNOSIS — Z5321 Procedure and treatment not carried out due to patient leaving prior to being seen by health care provider: Secondary | ICD-10-CM | POA: Insufficient documentation

## 2016-08-28 DIAGNOSIS — R05 Cough: Secondary | ICD-10-CM | POA: Insufficient documentation

## 2016-08-28 DIAGNOSIS — F17210 Nicotine dependence, cigarettes, uncomplicated: Secondary | ICD-10-CM | POA: Insufficient documentation

## 2016-08-28 LAB — BASIC METABOLIC PANEL
Anion gap: 9 (ref 5–15)
BUN: 7 mg/dL (ref 6–20)
CO2: 21 mmol/L — ABNORMAL LOW (ref 22–32)
Calcium: 9.1 mg/dL (ref 8.9–10.3)
Chloride: 104 mmol/L (ref 101–111)
Creatinine, Ser: 1.25 mg/dL — ABNORMAL HIGH (ref 0.44–1.00)
GFR calc Af Amer: >60 mL/min (ref >60)
GFR calc non Af Amer: 53 mL/min — ABNORMAL LOW (ref >60)
Glucose, Bld: 327 mg/dL — ABNORMAL HIGH (ref 65–99)
Potassium: 4.5 mmol/L (ref 3.5–5.1)
Sodium: 134 mmol/L — ABNORMAL LOW (ref 135–145)

## 2016-08-28 LAB — CBC WITH DIFFERENTIAL/PLATELET
Basophils Absolute: 0.1 10*3/uL (ref 0.0–0.1)
Basophils Relative: 1 %
Eosinophils Absolute: 0.5 10*3/uL (ref 0.0–0.7)
Eosinophils Relative: 4 %
HCT: 42.9 % (ref 36.0–46.0)
Hemoglobin: 14 g/dL (ref 12.0–15.0)
Lymphocytes Relative: 28 %
Lymphs Abs: 3.2 10*3/uL (ref 0.7–4.0)
MCH: 29.4 pg (ref 26.0–34.0)
MCHC: 32.6 g/dL (ref 30.0–36.0)
MCV: 89.9 fL (ref 78.0–100.0)
Monocytes Absolute: 0.6 10*3/uL (ref 0.1–1.0)
Monocytes Relative: 5 %
Neutro Abs: 7.1 10*3/uL (ref 1.7–7.7)
Neutrophils Relative %: 62 %
Platelets: 352 10*3/uL (ref 150–400)
RBC: 4.77 MIL/uL (ref 3.87–5.11)
RDW: 14.3 % (ref 11.5–15.5)
WBC: 11.4 10*3/uL — ABNORMAL HIGH (ref 4.0–10.5)

## 2016-08-28 LAB — D-DIMER, QUANTITATIVE: D-Dimer, Quant: 0.32 ug/mL-FEU (ref 0.00–0.50)

## 2016-08-28 LAB — TROPONIN I: Troponin I: <0.03 ng/mL (ref <0.03)

## 2016-08-28 NOTE — ED Notes (Signed)
Pt called for vitals recheck x3. No answer.  

## 2016-08-28 NOTE — ED Triage Notes (Signed)
Pt presents with 2 week h/o dry cough with onset of L sided chest pain.  Pain x 3 days, has been constant and worsening; reports pain radiates into L axilla and L scapula.   +shortness of breath.

## 2016-08-28 NOTE — ED Notes (Signed)
Pt called for room assignment by Charlett Nose EMT x3. No answer.

## 2016-10-09 ENCOUNTER — Encounter (HOSPITAL_COMMUNITY): Payer: Self-pay

## 2016-10-09 ENCOUNTER — Emergency Department (HOSPITAL_COMMUNITY)
Admission: EM | Admit: 2016-10-09 | Discharge: 2016-10-10 | Disposition: A | Discharge status: Home / Self Care | Payer: Medicaid Other | Attending: Emergency Medicine | Admitting: Emergency Medicine

## 2016-10-09 DIAGNOSIS — E669 Obesity, unspecified: Secondary | ICD-10-CM

## 2016-10-09 DIAGNOSIS — N3000 Acute cystitis without hematuria: Secondary | ICD-10-CM | POA: Insufficient documentation

## 2016-10-09 DIAGNOSIS — R739 Hyperglycemia, unspecified: Secondary | ICD-10-CM

## 2016-10-09 DIAGNOSIS — B353 Tinea pedis: Secondary | ICD-10-CM | POA: Insufficient documentation

## 2016-10-09 DIAGNOSIS — F17210 Nicotine dependence, cigarettes, uncomplicated: Secondary | ICD-10-CM | POA: Insufficient documentation

## 2016-10-09 DIAGNOSIS — E1169 Type 2 diabetes mellitus with other specified complication: Secondary | ICD-10-CM

## 2016-10-09 DIAGNOSIS — Z7984 Long term (current) use of oral hypoglycemic drugs: Secondary | ICD-10-CM | POA: Insufficient documentation

## 2016-10-09 DIAGNOSIS — E1165 Type 2 diabetes mellitus with hyperglycemia: Secondary | ICD-10-CM | POA: Insufficient documentation

## 2016-10-09 DIAGNOSIS — Z79899 Other long term (current) drug therapy: Secondary | ICD-10-CM | POA: Insufficient documentation

## 2016-10-09 LAB — CBC
HCT: 42.3 % (ref 36.0–46.0)
Hemoglobin: 14 g/dL (ref 12.0–15.0)
MCH: 29.2 pg (ref 26.0–34.0)
MCHC: 33.1 g/dL (ref 30.0–36.0)
MCV: 88.3 fL (ref 78.0–100.0)
Platelets: 365 10*3/uL (ref 150–400)
RBC: 4.79 MIL/uL (ref 3.87–5.11)
RDW: 14 % (ref 11.5–15.5)
WBC: 14.2 10*3/uL — ABNORMAL HIGH (ref 4.0–10.5)

## 2016-10-09 LAB — BASIC METABOLIC PANEL
Anion gap: 12 (ref 5–15)
BUN: 13 mg/dL (ref 6–20)
CO2: 20 mmol/L — ABNORMAL LOW (ref 22–32)
Calcium: 8.8 mg/dL — ABNORMAL LOW (ref 8.9–10.3)
Chloride: 100 mmol/L — ABNORMAL LOW (ref 101–111)
Creatinine, Ser: 1.39 mg/dL — ABNORMAL HIGH (ref 0.44–1.00)
GFR calc Af Amer: 54 mL/min — ABNORMAL LOW (ref >60)
GFR calc non Af Amer: 46 mL/min — ABNORMAL LOW (ref >60)
Glucose, Bld: 455 mg/dL — ABNORMAL HIGH (ref 65–99)
Potassium: 4.1 mmol/L (ref 3.5–5.1)
Sodium: 132 mmol/L — ABNORMAL LOW (ref 135–145)

## 2016-10-09 LAB — URINALYSIS, MICROSCOPIC (REFLEX)

## 2016-10-09 LAB — URINALYSIS, ROUTINE W REFLEX MICROSCOPIC
Bilirubin Urine: NEGATIVE
Glucose, UA: >=500 mg/dL — AB
Ketones, ur: NEGATIVE mg/dL
Nitrite: POSITIVE — AB
Protein, ur: NEGATIVE mg/dL
Specific Gravity, Urine: 1.02 (ref 1.005–1.030)
pH: 6 (ref 5.0–8.0)

## 2016-10-09 LAB — CBG MONITORING, ED: Glucose-Capillary: 454 mg/dL — ABNORMAL HIGH (ref 65–99)

## 2016-10-09 NOTE — ED Triage Notes (Signed)
Pt here for hyperglycemia, pt is not diagnosed with diabetes, pt also erprot blurred vision, "swimmy" headed and foot issues as well.

## 2016-10-10 LAB — CBG MONITORING, ED
Glucose-Capillary: 328 mg/dL — ABNORMAL HIGH (ref 65–99)
Glucose-Capillary: 302 mg/dL — ABNORMAL HIGH (ref 65–99)
Glucose-Capillary: 276 mg/dL — ABNORMAL HIGH (ref 65–99)
Glucose-Capillary: 259 mg/dL — ABNORMAL HIGH (ref 65–99)

## 2016-10-10 LAB — BASIC METABOLIC PANEL
Anion gap: 8 (ref 5–15)
BUN: 12 mg/dL (ref 6–20)
CO2: 21 mmol/L — ABNORMAL LOW (ref 22–32)
Calcium: 8.2 mg/dL — ABNORMAL LOW (ref 8.9–10.3)
Chloride: 106 mmol/L (ref 101–111)
Creatinine, Ser: 1.2 mg/dL — ABNORMAL HIGH (ref 0.44–1.00)
GFR calc Af Amer: >60 mL/min (ref >60)
GFR calc non Af Amer: 55 mL/min — ABNORMAL LOW (ref >60)
Glucose, Bld: 293 mg/dL — ABNORMAL HIGH (ref 65–99)
Potassium: 3.8 mmol/L (ref 3.5–5.1)
Sodium: 135 mmol/L (ref 135–145)

## 2016-10-10 MED ORDER — CEPHALEXIN 500 MG PO CAPS: 500.0000 mg | ORAL_CAPSULE | Freq: Four times a day (QID) | ORAL | 0 refills | Status: DC

## 2016-10-10 MED ORDER — DEXTROSE 5 % IV SOLN
1.0000 g | Freq: Once | INTRAVENOUS | Status: AC
  Administered 2016-10-10: 1 g via INTRAVENOUS
  Filled 2016-10-10: qty 10

## 2016-10-10 MED ORDER — GABAPENTIN 100 MG PO CAPS: 100.0000 mg | ORAL_CAPSULE | Freq: Two times a day (BID) | ORAL | 0 refills | Status: DC

## 2016-10-10 MED ORDER — GABAPENTIN 100 MG PO CAPS
200.0000 mg | ORAL_CAPSULE | Freq: Once | ORAL | Status: AC
  Administered 2016-10-10: 200 mg via ORAL
  Filled 2016-10-10: qty 2

## 2016-10-10 MED ORDER — SODIUM CHLORIDE 0.9 % IV BOLUS (SEPSIS)
1000.0000 mL | Freq: Once | INTRAVENOUS | Status: AC
  Administered 2016-10-10: 1000 mL via INTRAVENOUS

## 2016-10-10 MED ORDER — ONDANSETRON HCL 4 MG/2ML IJ SOLN
4.0000 mg | Freq: Once | INTRAMUSCULAR | Status: AC
  Administered 2016-10-10: 4 mg via INTRAVENOUS
  Filled 2016-10-10: qty 2

## 2016-10-10 MED ORDER — METFORMIN HCL 500 MG PO TABS: 500.0000 mg | ORAL_TABLET | Freq: Two times a day (BID) | ORAL | 1 refills | Status: DC

## 2016-10-10 MED ORDER — CLOTRIMAZOLE 1 % EX CREA: TOPICAL_CREAM | CUTANEOUS | 0 refills | Status: DC

## 2016-10-10 MED ORDER — GLUCOSE BLOOD VI STRP: ORAL_STRIP | 0 refills | Status: DC

## 2016-10-10 NOTE — ED Provider Notes (Signed)
[facility] DEPT Provider Note   CSN: [id number] Arrival date & time: 10/09/16  2214     History   Chief Complaint Chief Complaint  Patient presents with  . Hyperglycemia    HPI Terri Sparks is a 41 y.o. female with a hx of GERD, hypercalcemia, parathyroidectomy (2011), anxiety presents to the Emergency Department complaining of gradual, persistent, progressively worsening polyuria, polydipsia, left foot pain, fatigue, blurred vision and dizziness onset 3 mos ago.  Pt reports checking her sugars in the last few days with CBG 400-500.  FHX of NIDDM but no personal hx.  Pt also with dysuria.  Nothing makes it better and nothing makes it worse.  Pt denies fever, chills, headache, neck pain, chest pain, SOB, abd pain, diarrhea.  Pt with intermittent N/V over the lasts few weeks, but none today.  Patient reports that her left foot pain has been ongoing for months.  Record review shows previous urine cultures with E. Coli.     The history is provided by the patient and medical records. No language interpreter was used.    Past Medical History:  Diagnosis Date  . Anxiety   . GERD (gastroesophageal reflux disease)   . History of blood clots   . Hypercalcemia   . Hyperparathyroidism   . Nephrolithiasis   . Renal calculi     Patient Active Problem List   Diagnosis Date Noted  . History of blood clots   . GERD (gastroesophageal reflux disease)   . Renal calculi   . Hyperparathyroidism   . Hypercalcemia   . Anxiety   . Nephrolithiasis     Past Surgical History:  Procedure Laterality Date  . 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 TUBAL LIGATION      OB History    No data available       Home Medications    Prior to Admission medications   Medication Sig Start Date End Date Taking? Authorizing Provider  Ascorbic Acid (VITAMIN C) 1000 MG tablet Take 2,000 mg by mouth daily.    Historical Provider, MD  cephALEXin (KEFLEX) 500 MG capsule Take 1 capsule (500 mg  total) by mouth 4 (four) times daily. 10/10/16   Stephanine Reas, PA-C  clotrimazole (LOTRIMIN) 1 % cream Apply to affected area 2 times daily 10/10/16   Jarrett Soho Layth Cerezo, PA-C  gabapentin (NEURONTIN) 100 MG capsule Take 1 capsule (100 mg total) by mouth 2 (two) times daily. 10/10/16 10/25/16  Zyren Sevigny, PA-C  glucose blood test strip Use as instructed 10/10/16   Jarrett Soho Madisin Hasan, PA-C  ibuprofen (ADVIL,MOTRIN) 200 MG tablet Take 400 mg by mouth every 6 (six) hours as needed for moderate pain (pain).     Historical Provider, MD  metFORMIN (GLUCOPHAGE) 500 MG tablet Take 1 tablet (500 mg total) by mouth 2 (two) times daily with a meal. 10/10/16   Arron Mcnaught, PA-C  methocarbamol (ROBAXIN-750) 750 MG tablet Take 1 tablet (750 mg total) by mouth 4 (four) times daily. Patient not taking: Reported on 08/17/2015 06/04/15   Lacretia Leigh, MD  naproxen sodium (ANAPROX) 220 MG tablet Take 220 mg by mouth 2 (two) times daily as needed (pain).    Historical Provider, MD  ondansetron (ZOFRAN) 4 MG tablet Take 1 tablet (4 mg total) by mouth every 6 (six) hours. 08/17/15   Hanna Patel-Mills, PA-C  predniSONE (DELTASONE) 20 MG tablet Take 2 tablets (40 mg total) by mouth daily. Patient not taking: Reported on 08/17/2015 06/04/15   Lacretia Leigh, MD  promethazine (PHENERGAN) 25 MG tablet Take 1 tablet (25 mg total) by mouth every 6 (six) hours as needed for nausea or vomiting. Patient not taking: Reported on 08/17/2015 06/04/15   Lacretia Leigh, MD    Family History Family History  Problem Relation Age of Onset  . Depression Mother   . Diabetes Father   . Heart failure Father     Social History Social History  Substance Use Topics  . Smoking status: Current Every Day Smoker    Packs/day: 0.50    Years: 15.00    Types: Cigarettes  . Smokeless tobacco: Never Used  . Alcohol use No     Allergies   Macrobid [nitrofurantoin macrocrystal] and Sulfa antibiotics   Review of  Systems Review of Systems  Constitutional: Positive for fatigue.  Endocrine: Positive for polydipsia and polyuria. Negative for polyphagia.  Musculoskeletal: Positive for arthralgias ( right foot).  All other systems reviewed and are negative.    Physical Exam Updated Vital Signs BP (!) 130/111   Pulse 98   Temp 98.3 F (36.8 C) (Oral)   Resp 20   Ht 5\' 7"  (1.702 m)   Wt 116.1 kg   SpO2 98%   BMI 40.10 kg/m   Physical Exam  Constitutional: She appears well-developed and well-nourished. No distress.  Awake, alert, nontoxic appearance  HENT:  Head: Normocephalic and atraumatic.  Mouth/Throat: Oropharynx is clear and moist. Mucous membranes are dry. No oropharyngeal exudate.  Eyes: Conjunctivae are normal. No scleral icterus.  Neck: Normal range of motion. Neck supple.  Cardiovascular: Normal rate, regular rhythm and intact distal pulses.   Pulmonary/Chest: Effort normal and breath sounds normal. No respiratory distress. She has no wheezes.  Equal chest expansion  Abdominal: Soft. Bowel sounds are normal. She exhibits no mass. There is no tenderness. There is no rebound and no guarding.  Musculoskeletal: Normal range of motion. She exhibits no edema.  Bilateral feet with erythema, scaling and fissures Tenderness to palpation left foot and ankle without edema erythema or ecchymosis, full range of motion of the left ankle in all toes of left foot  Neurological: She is alert.  Speech is clear and goal oriented Moves extremities without ataxia  Skin: Skin is warm and dry. She is not diaphoretic.  Psychiatric: She has a normal mood and affect.  Nursing note and vitals reviewed.    ED Treatments / Results  Labs (all labs ordered are listed, but only abnormal results are displayed) Labs Reviewed  BASIC METABOLIC PANEL - Abnormal; Notable for the following:       Result Value   Sodium 132 (*)    Chloride 100 (*)    CO2 20 (*)    Glucose, Bld 455 (*)    Creatinine, Ser  1.39 (*)    Calcium 8.8 (*)    GFR calc non Af Amer 46 (*)    GFR calc Af Amer 54 (*)    All other components within normal limits  CBC - Abnormal; Notable for the following:    WBC 14.2 (*)    All other components within normal limits  URINALYSIS, ROUTINE W REFLEX MICROSCOPIC - Abnormal; Notable for the following:    APPearance HAZY (*)    Glucose, UA >=500 (*)    Hgb urine dipstick SMALL (*)    Nitrite POSITIVE (*)    Leukocytes, UA LARGE (*)    All other components within normal limits  URINALYSIS, MICROSCOPIC (REFLEX) - Abnormal; Notable for the following:  Bacteria, UA RARE (*)    Squamous Epithelial / LPF 0-5 (*)    All other components within normal limits  BASIC METABOLIC PANEL - Abnormal; Notable for the following:    CO2 21 (*)    Glucose, Bld 293 (*)    Creatinine, Ser 1.20 (*)    Calcium 8.2 (*)    GFR calc non Af Amer 55 (*)    All other components within normal limits  CBG MONITORING, ED - Abnormal; Notable for the following:    Glucose-Capillary 454 (*)    All other components within normal limits  CBG MONITORING, ED - Abnormal; Notable for the following:    Glucose-Capillary 328 (*)    All other components within normal limits  CBG MONITORING, ED - Abnormal; Notable for the following:    Glucose-Capillary 302 (*)    All other components within normal limits  CBG MONITORING, ED - Abnormal; Notable for the following:    Glucose-Capillary 276 (*)    All other components within normal limits  CBG MONITORING, ED - Abnormal; Notable for the following:    Glucose-Capillary 259 (*)    All other components within normal limits  URINE CULTURE  CBG MONITORING, ED  CBG MONITORING, ED    EKG  EKG Interpretation  Date/Time:  Saturday October 09 2016 22:29:26 EST Ventricular Rate:  110 PR Interval:  128 QRS Duration: 78 QT Interval:  338 QTC Calculation: 457 R Axis:   56 Text Interpretation:  Sinus tachycardia Otherwise normal ECG Rate faster No significant  change was found Confirmed by Wyvonnia Dusky  MD, STEPHEN ([id number]) on 10/10/2016 1:31:46 AM       Radiology No results found.  Procedures Procedures (including critical care time)  Medications Ordered in ED Medications  sodium chloride 0.9 % bolus 1,000 mL (0 mLs Intravenous Stopped 10/10/16 0119)  sodium chloride 0.9 % bolus 1,000 mL (0 mLs Intravenous Stopped 10/10/16 0333)  ondansetron (ZOFRAN) injection 4 mg (4 mg Intravenous Given 10/10/16 0155)  cefTRIAXone (ROCEPHIN) 1 g in dextrose 5 % 50 mL IVPB (0 g Intravenous Stopped 10/10/16 0333)  gabapentin (NEURONTIN) capsule 200 mg (200 mg Oral Given 10/10/16 0217)     Initial Impression / Assessment and Plan / ED Course  I have reviewed the triage vital signs and the nursing notes.  Pertinent labs & imaging results that were available during my care of the patient were reviewed by me and considered in my medical decision making (see chart for details).  Clinical Course      Pt with hyperglycemia and new onset diabetes.  Normal anion gap.  No keytones in her urine.  No evidence of DKA. Patient given several liters of fluid with improvement in her blood sugars and CO2.  Patient will be discharged home with metformin. She reports she is feeling better.  UA shows evidence of UTI. Patient given Rocephin here and will be discharged home with Keflex. No evidence of pyelonephritis. Patient is afebrile.  Tachycardia resolved with fluids.  Patient also with evidence of tinea pedis. Will give Lotrimin. No evidence of secondary infection. Suspect pain in the left foot is secondary to diabetic neuropathy however patient will need neurology follow-up for this along with primary care. Patient given referral to [facility] health and wellness.  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 (HCC)  Hyperglycemia  Acute cystitis without hematuria  Tinea pedis of both feet    New Prescriptions Discharge Medication  List as of 10/10/2016  4:47 AM    START taking these medications   Details  clotrimazole (LOTRIMIN) 1 % cream Apply to affected area 2 times daily, Print    gabapentin (NEURONTIN) 100 MG capsule Take 1 capsule (100 mg total) by mouth 2 (two) times daily., Starting Sun 10/10/2016, Until Mon 10/25/2016, Print    glucose blood test strip Use as instructed, Print    metFORMIN (GLUCOPHAGE) 500 MG tablet Take 1 tablet (500 mg total) by mouth 2 (two) times daily with a meal., Starting Sun 10/10/2016, Print         Tylan Briguglio, PA-C 10/10/16 6004    Ezequiel Essex, MD 10/10/16 [phone]

## 2016-10-10 NOTE — ED Notes (Signed)
Pt comes in complaining of high blood sugar with blurred vision. Pt states she has been losing weight, drinking a lot of fluids, urinating a lot, and has a painful left foot. Pt states she took her blood sugar with a friends glucometer and it was in the 400's.

## 2016-10-10 NOTE — Discharge Instructions (Signed)
1. Medications: Lotrimin, Neurontin, Keflex, metformin, usual home medications 2. Treatment: rest, drink plenty of fluids,  3. Follow Up: Please followup with your primary doctor in 2-3 days for discussion of your diagnoses and further evaluation after today's visit; if you do not have a primary care doctor use the resource guide provided to find one; Please return to the ER for worsening symptoms or persistently high blood sugars

## 2016-10-10 NOTE — ED Notes (Signed)
Checked CBG 302, RN Scott informed

## 2016-10-10 NOTE — ED Notes (Signed)
Checked CBG 259, RN Scott informed

## 2016-10-10 NOTE — ED Notes (Signed)
Checked CBG 328, RN Scott informed

## 2016-10-10 NOTE — ED Notes (Signed)
Check CBG 276, RN Scott informed

## 2016-10-10 NOTE — ED Notes (Addendum)
Pt able to ambulate to the bathroom w/o any problems, given water per Orvil Feil

## 2016-10-13 LAB — URINE CULTURE: Culture: >=100000 — AB

## 2016-10-14 ENCOUNTER — Telehealth (HOSPITAL_BASED_OUTPATIENT_CLINIC_OR_DEPARTMENT_OTHER): Payer: Self-pay

## 2016-10-14 NOTE — Progress Notes (Signed)
ED Antimicrobial Stewardship Positive Culture Follow Up   Terri Sparks is an 41 y.o. female who presented to [facility] on 10/09/2016 with a chief complaint of  Chief Complaint  Patient presents with  . Hyperglycemia    Recent Results (from the past 720 hour(s))  Urine culture     Status: Abnormal   Collection Time: 10/09/16 10:21 PM  Result Value Ref Range Status   Specimen Description URINE, CLEAN CATCH  Final   Special Requests NONE  Final   Culture >=100,000 COLONIES/mL ESCHERICHIA COLI (A)  Final   Report Status 10/13/2016 FINAL  Final   Organism ID, Bacteria ESCHERICHIA COLI (A)  Final      Susceptibility   Escherichia coli - MIC*    AMPICILLIN >=32 RESISTANT Resistant     CEFAZOLIN 32 INTERMEDIATE Intermediate     CEFTRIAXONE <=1 SENSITIVE Sensitive     CIPROFLOXACIN <=0.25 SENSITIVE Sensitive     GENTAMICIN <=1 SENSITIVE Sensitive     IMIPENEM <=0.25 SENSITIVE Sensitive     NITROFURANTOIN <=16 SENSITIVE Sensitive     TRIMETH/SULFA <=20 SENSITIVE Sensitive     AMPICILLIN/SULBACTAM >=32 RESISTANT Resistant     PIP/TAZO >=128 RESISTANT Resistant     Extended ESBL NEGATIVE Sensitive     * >=100,000 COLONIES/mL ESCHERICHIA COLI    [x]  Treated with cephalexin, organism intermediate to prescribed antimicrobial  Plan is to call patient to see if she is having any urinary symptoms. If present, change antibiotic course.   New antibiotic prescription: STOP cephalexin START ciprofloxacin 500mg  PO BID x 3 days  ED Provider: Alecia Lemming, PA  Delane Ginger 10/14/2016, 9:13 AM Infectious Diseases Pharmacist Phone# [phone]

## 2016-10-14 NOTE — Telephone Encounter (Signed)
Post ED Visit - Positive Culture Follow-up: Successful Patient Follow-Up  Culture assessed and recommendations reviewed by: []  Elenor Quinones, Pharm.D. []  Heide Guile, Pharm.D., BCPS []  Parks Neptune, Pharm.D. []  Alycia Rossetti, Pharm.D., BCPS []  [place], [place].D., BCPS, AAHIVP []  Legrand Como, Pharm.D., BCPS, AAHIVP []  Milus Glazier, Pharm.D. []  Stephens November, Pharm.DStanford Breed, Pharm.D.  Positive urine culture, >/= 100,000 colonies -> E Coli  []  Patient discharged without antimicrobial prescription and treatment is now indicated []  Organism is resistant to prescribed ED discharge antimicrobial(Cephalexin) []  Patient with positive blood cultures  Changes discussed with ED provider: Carlisle Cater PA-C Call patient to see if symptoms of UTI are present  New antibiotic prescription "If symptoms stop cephalexin, start ciprofloxacin 500 mg po BID for 3 days" Called to   Contacted patient, date 10/14/2016, time 1402  No answer or machine at home # and mobile # non working number.  Letter sent to [employer] address.   Dortha Kern 10/14/2016, 2:03 PM

## 2016-11-16 ENCOUNTER — Telehealth: Payer: Self-pay | Admitting: Emergency Medicine

## 2016-11-16 NOTE — Telephone Encounter (Signed)
No response to letter, lost to followup

## 2016-12-27 ENCOUNTER — Emergency Department (HOSPITAL_COMMUNITY)
Admission: EM | Admit: 2016-12-27 | Discharge: 2016-12-27 | Disposition: A | Discharge status: Home / Self Care | Payer: Medicaid Other | Attending: Emergency Medicine | Admitting: Emergency Medicine

## 2016-12-27 ENCOUNTER — Encounter (HOSPITAL_COMMUNITY): Payer: Self-pay

## 2016-12-27 DIAGNOSIS — Z5321 Procedure and treatment not carried out due to patient leaving prior to being seen by health care provider: Secondary | ICD-10-CM | POA: Insufficient documentation

## 2016-12-27 DIAGNOSIS — F17210 Nicotine dependence, cigarettes, uncomplicated: Secondary | ICD-10-CM | POA: Insufficient documentation

## 2016-12-27 DIAGNOSIS — R1084 Generalized abdominal pain: Secondary | ICD-10-CM | POA: Insufficient documentation

## 2016-12-27 LAB — CBC
HCT: 42.9 % (ref 36.0–46.0)
Hemoglobin: 14.1 g/dL (ref 12.0–15.0)
MCH: 29.2 pg (ref 26.0–34.0)
MCHC: 32.9 g/dL (ref 30.0–36.0)
MCV: 88.8 fL (ref 78.0–100.0)
Platelets: 405 10*3/uL — ABNORMAL HIGH (ref 150–400)
RBC: 4.83 MIL/uL (ref 3.87–5.11)
RDW: 14.3 % (ref 11.5–15.5)
WBC: 13.8 10*3/uL — ABNORMAL HIGH (ref 4.0–10.5)

## 2016-12-27 LAB — COMPREHENSIVE METABOLIC PANEL
ALT: 15 U/L (ref 14–54)
AST: 15 U/L (ref 15–41)
Albumin: 3.5 g/dL (ref 3.5–5.0)
Alkaline Phosphatase: 109 U/L (ref 38–126)
Anion gap: 11 (ref 5–15)
BUN: 10 mg/dL (ref 6–20)
CO2: 25 mmol/L (ref 22–32)
Calcium: 9.2 mg/dL (ref 8.9–10.3)
Chloride: 95 mmol/L — ABNORMAL LOW (ref 101–111)
Creatinine, Ser: 1.34 mg/dL — ABNORMAL HIGH (ref 0.44–1.00)
GFR calc Af Amer: 56 mL/min — ABNORMAL LOW (ref >60)
GFR calc non Af Amer: 48 mL/min — ABNORMAL LOW (ref >60)
Glucose, Bld: 518 mg/dL (ref 65–99)
Potassium: 4.3 mmol/L (ref 3.5–5.1)
Sodium: 131 mmol/L — ABNORMAL LOW (ref 135–145)
Total Bilirubin: 0.5 mg/dL (ref 0.3–1.2)
Total Protein: 7.1 g/dL (ref 6.5–8.1)

## 2016-12-27 LAB — I-STAT BETA HCG BLOOD, ED (MC, WL, AP ONLY): I-stat hCG, quantitative: <5 m[IU]/mL (ref <5)

## 2016-12-27 LAB — URINALYSIS, ROUTINE W REFLEX MICROSCOPIC
Bilirubin Urine: NEGATIVE
Glucose, UA: >=500 mg/dL — AB
Hgb urine dipstick: NEGATIVE
Ketones, ur: NEGATIVE mg/dL
Nitrite: NEGATIVE
Protein, ur: NEGATIVE mg/dL
Specific Gravity, Urine: 1.018 (ref 1.005–1.030)
pH: 6 (ref 5.0–8.0)

## 2016-12-27 LAB — LIPASE, BLOOD: Lipase: 50 U/L (ref 11–51)

## 2016-12-27 NOTE — ED Notes (Signed)
CRITICAL VALUE ALERT  Critical value received:  CBG 518  Date of notification:  12/27/2016   Time of notification:  8307 Critical value read back:Yes.    Nurse who received alert:  Leodis Rains  MD notified (1st page):  Dr. Ellender Hose [phone] No further orders at this time. Will monitor pt and move back to room as soon as possible.

## 2016-12-27 NOTE — ED Notes (Signed)
Pt called for room. No answer, pt not in restroom, triage or waiting room

## 2016-12-27 NOTE — ED Triage Notes (Signed)
Pt reports generalized abdominal pain for 3 days associated with foul smelling burps, abdominal swelling and small bowel movements. LBM was today

## 2016-12-27 NOTE — ED Triage Notes (Signed)
Called for room. Pt did not answer after multiple attempts. Will D/c pt.

## 2017-02-24 ENCOUNTER — Inpatient Hospital Stay (HOSPITAL_COMMUNITY)
Admission: EM | Admit: 2017-02-24 | Discharge: 2017-03-03 | DRG: 253 | Disposition: A | Discharge status: Home / Self Care | Payer: Self-pay | Attending: Vascular Surgery | Admitting: Vascular Surgery

## 2017-02-24 ENCOUNTER — Encounter (HOSPITAL_COMMUNITY)
Admission: EM | Disposition: A | Discharge status: Home / Self Care | Payer: Self-pay | Source: Home / Self Care | Attending: Vascular Surgery

## 2017-02-24 ENCOUNTER — Emergency Department (HOSPITAL_COMMUNITY): Payer: Self-pay | Admitting: Certified Registered Nurse Anesthetist

## 2017-02-24 ENCOUNTER — Emergency Department (HOSPITAL_COMMUNITY): Payer: Self-pay

## 2017-02-24 ENCOUNTER — Encounter (HOSPITAL_COMMUNITY): Payer: Self-pay | Admitting: Emergency Medicine

## 2017-02-24 DIAGNOSIS — D649 Anemia, unspecified: Secondary | ICD-10-CM | POA: Diagnosis present

## 2017-02-24 DIAGNOSIS — D62 Acute posthemorrhagic anemia: Secondary | ICD-10-CM | POA: Diagnosis not present

## 2017-02-24 DIAGNOSIS — E1165 Type 2 diabetes mellitus with hyperglycemia: Secondary | ICD-10-CM | POA: Diagnosis present

## 2017-02-24 DIAGNOSIS — I998 Other disorder of circulatory system: Secondary | ICD-10-CM

## 2017-02-24 DIAGNOSIS — Z79899 Other long term (current) drug therapy: Secondary | ICD-10-CM

## 2017-02-24 DIAGNOSIS — E119 Type 2 diabetes mellitus without complications: Secondary | ICD-10-CM

## 2017-02-24 DIAGNOSIS — E872 Acidosis, unspecified: Secondary | ICD-10-CM

## 2017-02-24 DIAGNOSIS — Z794 Long term (current) use of insulin: Secondary | ICD-10-CM

## 2017-02-24 DIAGNOSIS — Z419 Encounter for procedure for purposes other than remedying health state, unspecified: Secondary | ICD-10-CM

## 2017-02-24 DIAGNOSIS — R739 Hyperglycemia, unspecified: Secondary | ICD-10-CM

## 2017-02-24 DIAGNOSIS — K219 Gastro-esophageal reflux disease without esophagitis: Secondary | ICD-10-CM | POA: Diagnosis present

## 2017-02-24 DIAGNOSIS — I743 Embolism and thrombosis of arteries of the lower extremities: Secondary | ICD-10-CM | POA: Diagnosis present

## 2017-02-24 DIAGNOSIS — N390 Urinary tract infection, site not specified: Secondary | ICD-10-CM | POA: Diagnosis present

## 2017-02-24 DIAGNOSIS — Z720 Tobacco use: Secondary | ICD-10-CM | POA: Diagnosis present

## 2017-02-24 DIAGNOSIS — F17210 Nicotine dependence, cigarettes, uncomplicated: Secondary | ICD-10-CM | POA: Diagnosis present

## 2017-02-24 DIAGNOSIS — M79605 Pain in left leg: Secondary | ICD-10-CM

## 2017-02-24 DIAGNOSIS — Z7984 Long term (current) use of oral hypoglycemic drugs: Secondary | ICD-10-CM

## 2017-02-24 DIAGNOSIS — Z86718 Personal history of other venous thrombosis and embolism: Secondary | ICD-10-CM

## 2017-02-24 DIAGNOSIS — E1151 Type 2 diabetes mellitus with diabetic peripheral angiopathy without gangrene: Secondary | ICD-10-CM | POA: Diagnosis present

## 2017-02-24 DIAGNOSIS — E1122 Type 2 diabetes mellitus with diabetic chronic kidney disease: Secondary | ICD-10-CM | POA: Diagnosis present

## 2017-02-24 DIAGNOSIS — F419 Anxiety disorder, unspecified: Secondary | ICD-10-CM | POA: Diagnosis present

## 2017-02-24 DIAGNOSIS — I745 Embolism and thrombosis of iliac artery: Principal | ICD-10-CM | POA: Diagnosis present

## 2017-02-24 DIAGNOSIS — I739 Peripheral vascular disease, unspecified: Secondary | ICD-10-CM | POA: Diagnosis present

## 2017-02-24 DIAGNOSIS — N183 Chronic kidney disease, stage 3 (moderate): Secondary | ICD-10-CM | POA: Diagnosis present

## 2017-02-24 DIAGNOSIS — N760 Acute vaginitis: Secondary | ICD-10-CM | POA: Diagnosis not present

## 2017-02-24 HISTORY — DX: Type 2 diabetes mellitus without complications: E11.9

## 2017-02-24 HISTORY — PX: LOWER EXTREMITY ANGIOGRAM: SHX5508

## 2017-02-24 HISTORY — PX: EMBOLECTOMY: SHX44

## 2017-02-24 HISTORY — PX: INSERTION OF ILIAC STENT: SHX6256

## 2017-02-24 LAB — COMPREHENSIVE METABOLIC PANEL
ALT: 18 U/L (ref 14–54)
AST: 27 U/L (ref 15–41)
Albumin: 2.9 g/dL — ABNORMAL LOW (ref 3.5–5.0)
Alkaline Phosphatase: 91 U/L (ref 38–126)
Anion gap: 14 (ref 5–15)
BUN: 11 mg/dL (ref 6–20)
CO2: 18 mmol/L — ABNORMAL LOW (ref 22–32)
Calcium: 8.7 mg/dL — ABNORMAL LOW (ref 8.9–10.3)
Chloride: 98 mmol/L — ABNORMAL LOW (ref 101–111)
Creatinine, Ser: 1.47 mg/dL — ABNORMAL HIGH (ref 0.44–1.00)
GFR calc Af Amer: 50 mL/min — ABNORMAL LOW (ref >60)
GFR calc non Af Amer: 43 mL/min — ABNORMAL LOW (ref >60)
Glucose, Bld: 521 mg/dL (ref 65–99)
Potassium: 3.8 mmol/L (ref 3.5–5.1)
Sodium: 130 mmol/L — ABNORMAL LOW (ref 135–145)
Total Bilirubin: 0.8 mg/dL (ref 0.3–1.2)
Total Protein: 6.3 g/dL — ABNORMAL LOW (ref 6.5–8.1)

## 2017-02-24 LAB — URINALYSIS, ROUTINE W REFLEX MICROSCOPIC
Bilirubin Urine: NEGATIVE
Glucose, UA: >=500 mg/dL — AB
Ketones, ur: 20 mg/dL — AB
Nitrite: POSITIVE — AB
Protein, ur: NEGATIVE mg/dL
Specific Gravity, Urine: 1.032 — ABNORMAL HIGH (ref 1.005–1.030)
pH: 6 (ref 5.0–8.0)

## 2017-02-24 LAB — I-STAT BETA HCG BLOOD, ED (MC, WL, AP ONLY): I-stat hCG, quantitative: <5 m[IU]/mL (ref <5)

## 2017-02-24 LAB — CBC WITH DIFFERENTIAL/PLATELET
Basophils Absolute: 0 10*3/uL (ref 0.0–0.1)
Basophils Relative: 0 %
Eosinophils Absolute: 0.1 10*3/uL (ref 0.0–0.7)
Eosinophils Relative: 0 %
HCT: 40.8 % (ref 36.0–46.0)
Hemoglobin: 13.6 g/dL (ref 12.0–15.0)
Lymphocytes Relative: 15 %
Lymphs Abs: 2.6 10*3/uL (ref 0.7–4.0)
MCH: 28.7 pg (ref 26.0–34.0)
MCHC: 33.3 g/dL (ref 30.0–36.0)
MCV: 86.1 fL (ref 78.0–100.0)
Monocytes Absolute: 0.6 10*3/uL (ref 0.1–1.0)
Monocytes Relative: 3 %
Neutro Abs: 14.5 10*3/uL — ABNORMAL HIGH (ref 1.7–7.7)
Neutrophils Relative %: 82 %
Platelets: 294 10*3/uL (ref 150–400)
RBC: 4.74 MIL/uL (ref 3.87–5.11)
RDW: 14.4 % (ref 11.5–15.5)
WBC: 17.8 10*3/uL — ABNORMAL HIGH (ref 4.0–10.5)

## 2017-02-24 LAB — I-STAT CG4 LACTIC ACID, ED: Lactic Acid, Venous: 3.42 mmol/L (ref 0.5–1.9)

## 2017-02-24 LAB — CBG MONITORING, ED
Glucose-Capillary: 534 mg/dL (ref 65–99)
Glucose-Capillary: 480 mg/dL — ABNORMAL HIGH (ref 65–99)

## 2017-02-24 LAB — I-STAT VENOUS BLOOD GAS, ED
Acid-base deficit: 4 mmol/L — ABNORMAL HIGH (ref 0.0–2.0)
Bicarbonate: 16.7 mmol/L — ABNORMAL LOW (ref 20.0–28.0)
O2 Saturation: 92 %
TCO2: 17 mmol/L (ref 0–100)
pCO2, Ven: 22 mmHg — ABNORMAL LOW (ref 44.0–60.0)
pH, Ven: 7.489 — ABNORMAL HIGH (ref 7.250–7.430)
pO2, Ven: 57 mmHg — ABNORMAL HIGH (ref 32.0–45.0)

## 2017-02-24 LAB — ABO/RH: ABO/RH(D): O NEG

## 2017-02-24 LAB — TYPE AND SCREEN
ABO/RH(D): O NEG
Antibody Screen: NEGATIVE

## 2017-02-24 LAB — GLUCOSE, CAPILLARY: Glucose-Capillary: 195 mg/dL — ABNORMAL HIGH (ref 65–99)

## 2017-02-24 SURGERY — EMBOLECTOMY
Anesthesia: General | Laterality: Left

## 2017-02-24 MED ORDER — SODIUM CHLORIDE 0.9 % IV SOLN: INTRAVENOUS | Status: DC

## 2017-02-24 MED ORDER — SUGAMMADEX SODIUM 200 MG/2ML IV SOLN
INTRAVENOUS | Status: DC | PRN
  Administered 2017-02-24: 200 mg via INTRAVENOUS

## 2017-02-24 MED ORDER — FENTANYL CITRATE (PF) 250 MCG/5ML IJ SOLN
INTRAMUSCULAR | Status: AC
  Filled 2017-02-24: qty 5

## 2017-02-24 MED ORDER — DIPHENHYDRAMINE HCL 50 MG/ML IJ SOLN
50.0000 mg | Freq: Once | INTRAMUSCULAR | Status: AC
  Administered 2017-02-24: 50 mg via INTRAVENOUS
  Filled 2017-02-24: qty 1

## 2017-02-24 MED ORDER — SODIUM CHLORIDE 0.9 % IV BOLUS (SEPSIS)
1000.0000 mL | Freq: Once | INTRAVENOUS | Status: AC
  Administered 2017-02-24: 1000 mL via INTRAVENOUS

## 2017-02-24 MED ORDER — SODIUM CHLORIDE 0.9 % IV SOLN
INTRAVENOUS | Status: DC | PRN
  Administered 2017-02-24: 1000 mL

## 2017-02-24 MED ORDER — DEXTROSE 50 % IV SOLN
25.0000 mL | INTRAVENOUS | Status: DC | PRN
  Filled 2017-02-24: qty 50

## 2017-02-24 MED ORDER — HEPARIN (PORCINE) IN NACL 100-0.45 UNIT/ML-% IJ SOLN
1600.0000 [IU]/h | INTRAMUSCULAR | Status: DC
  Administered 2017-02-24: 1600 [IU]/h via INTRAVENOUS
  Filled 2017-02-24 (×2): qty 250

## 2017-02-24 MED ORDER — INSULIN REGULAR BOLUS VIA INFUSION
0.0000 [IU] | Freq: Three times a day (TID) | INTRAVENOUS | Status: DC
  Filled 2017-02-24: qty 10

## 2017-02-24 MED ORDER — OXYCODONE HCL 5 MG PO TABS
5.0000 mg | ORAL_TABLET | Freq: Once | ORAL | Status: AC | PRN
  Administered 2017-02-25: 5 mg via ORAL

## 2017-02-24 MED ORDER — PROPOFOL 10 MG/ML IV BOLUS
INTRAVENOUS | Status: DC | PRN
  Administered 2017-02-24: 40 mg via INTRAVENOUS
  Administered 2017-02-24: 50 mg via INTRAVENOUS
  Administered 2017-02-24: 110 mg via INTRAVENOUS

## 2017-02-24 MED ORDER — SODIUM CHLORIDE 0.9 % IV SOLN
INTRAVENOUS | Status: DC
  Administered 2017-02-24 – 2017-02-25 (×3): via INTRAVENOUS

## 2017-02-24 MED ORDER — SODIUM CHLORIDE 0.9 % IV SOLN
INTRAVENOUS | Status: DC
  Administered 2017-02-25 (×2): via INTRAVENOUS
  Filled 2017-02-24 (×2): qty 1

## 2017-02-24 MED ORDER — PROPOFOL 10 MG/ML IV BOLUS
INTRAVENOUS | Status: AC
  Filled 2017-02-24: qty 20

## 2017-02-24 MED ORDER — PHENYLEPHRINE 40 MCG/ML (10ML) SYRINGE FOR IV PUSH (FOR BLOOD PRESSURE SUPPORT)
PREFILLED_SYRINGE | INTRAVENOUS | Status: DC | PRN
  Administered 2017-02-24: 40 ug via INTRAVENOUS

## 2017-02-24 MED ORDER — OXYCODONE HCL 5 MG/5ML PO SOLN: 5.0000 mg | Freq: Once | ORAL | Status: AC | PRN

## 2017-02-24 MED ORDER — ROCURONIUM BROMIDE 100 MG/10ML IV SOLN
INTRAVENOUS | Status: DC | PRN
  Administered 2017-02-24: 40 mg via INTRAVENOUS
  Administered 2017-02-24 (×3): 10 mg via INTRAVENOUS
  Administered 2017-02-24: 30 mg via INTRAVENOUS
  Administered 2017-02-24: 10 mg via INTRAVENOUS
  Administered 2017-02-24: 20 mg via INTRAVENOUS

## 2017-02-24 MED ORDER — IOPAMIDOL (ISOVUE-370) INJECTION 76%
INTRAVENOUS | Status: AC
  Filled 2017-02-24: qty 100

## 2017-02-24 MED ORDER — SODIUM CHLORIDE 0.9 % IV SOLN
INTRAVENOUS | Status: DC | PRN
  Administered 2017-02-24: 6.5 [IU]/h via INTRAVENOUS

## 2017-02-24 MED ORDER — ONDANSETRON HCL 4 MG/2ML IJ SOLN
INTRAMUSCULAR | Status: AC
  Filled 2017-02-24: qty 2

## 2017-02-24 MED ORDER — FENTANYL CITRATE (PF) 100 MCG/2ML IJ SOLN
25.0000 ug | INTRAMUSCULAR | Status: DC | PRN
  Administered 2017-02-24 (×2): 50 ug via INTRAVENOUS

## 2017-02-24 MED ORDER — LACTATED RINGERS IV SOLN
INTRAVENOUS | Status: DC | PRN
  Administered 2017-02-24 (×2): via INTRAVENOUS

## 2017-02-24 MED ORDER — PHENYLEPHRINE 40 MCG/ML (10ML) SYRINGE FOR IV PUSH (FOR BLOOD PRESSURE SUPPORT)
PREFILLED_SYRINGE | INTRAVENOUS | Status: AC
  Filled 2017-02-24: qty 20

## 2017-02-24 MED ORDER — SUCCINYLCHOLINE CHLORIDE 20 MG/ML IJ SOLN
INTRAMUSCULAR | Status: DC | PRN
  Administered 2017-02-24: 60 mg via INTRAVENOUS

## 2017-02-24 MED ORDER — ONDANSETRON HCL 4 MG/2ML IJ SOLN
4.0000 mg | Freq: Four times a day (QID) | INTRAMUSCULAR | Status: DC | PRN
  Administered 2017-02-24 – 2017-03-01 (×6): 4 mg via INTRAVENOUS
  Filled 2017-02-24 (×5): qty 2

## 2017-02-24 MED ORDER — HEPARIN BOLUS VIA INFUSION
6400.0000 [IU] | Freq: Once | INTRAVENOUS | Status: AC
  Administered 2017-02-24: 6400 [IU] via INTRAVENOUS
  Filled 2017-02-24: qty 6400

## 2017-02-24 MED ORDER — SUGAMMADEX SODIUM 200 MG/2ML IV SOLN
INTRAVENOUS | Status: AC
  Filled 2017-02-24: qty 2

## 2017-02-24 MED ORDER — DEXTROSE-NACL 5-0.45 % IV SOLN: INTRAVENOUS | Status: DC

## 2017-02-24 MED ORDER — INSULIN ASPART 100 UNIT/ML ~~LOC~~ SOLN
10.0000 [IU] | Freq: Once | SUBCUTANEOUS | Status: AC
  Administered 2017-02-24: 10 [IU] via SUBCUTANEOUS
  Filled 2017-02-24: qty 1

## 2017-02-24 MED ORDER — HEPARIN SODIUM (PORCINE) 1000 UNIT/ML IJ SOLN
INTRAMUSCULAR | Status: DC | PRN
  Administered 2017-02-24: 4000 [IU] via INTRAVENOUS
  Administered 2017-02-24 (×2): 3000 [IU] via INTRAVENOUS

## 2017-02-24 MED ORDER — IODIXANOL 320 MG/ML IV SOLN
INTRAVENOUS | Status: DC | PRN
  Administered 2017-02-24: 65 mL via INTRA_ARTERIAL

## 2017-02-24 MED ORDER — 0.9 % SODIUM CHLORIDE (POUR BTL) OPTIME
TOPICAL | Status: DC | PRN
  Administered 2017-02-24: 1000 mL

## 2017-02-24 MED ORDER — ALBUMIN HUMAN 5 % IV SOLN
INTRAVENOUS | Status: DC | PRN
Start: 2017-02-24 — End: 2017-02-24
  Administered 2017-02-24: 21:00:00 via INTRAVENOUS

## 2017-02-24 MED ORDER — LIDOCAINE 2% (20 MG/ML) 5 ML SYRINGE
INTRAMUSCULAR | Status: AC
  Filled 2017-02-24: qty 5

## 2017-02-24 MED ORDER — HEMOSTATIC AGENTS (NO CHARGE) OPTIME
TOPICAL | Status: DC | PRN
  Administered 2017-02-24: 1 via TOPICAL

## 2017-02-24 MED ORDER — LIDOCAINE HCL (CARDIAC) 20 MG/ML IV SOLN
INTRAVENOUS | Status: DC | PRN
  Administered 2017-02-24: 60 mg via INTRATRACHEAL

## 2017-02-24 MED ORDER — VANCOMYCIN HCL IN DEXTROSE 1-5 GM/200ML-% IV SOLN
1000.0000 mg | Freq: Once | INTRAVENOUS | Status: AC
  Administered 2017-02-24: 1000 mg via INTRAVENOUS
  Filled 2017-02-24: qty 200

## 2017-02-24 MED ORDER — IOPAMIDOL (ISOVUE-370) INJECTION 76%
INTRAVENOUS | Status: AC
  Administered 2017-02-24: 125 mL via INTRAVENOUS
  Filled 2017-02-24: qty 50

## 2017-02-24 MED ORDER — FENTANYL CITRATE (PF) 100 MCG/2ML IJ SOLN
50.0000 ug | INTRAMUSCULAR | Status: AC | PRN
  Administered 2017-02-24 (×2): 50 ug via INTRAVENOUS
  Administered 2017-02-24: 100 ug via INTRAVENOUS
  Administered 2017-02-24: 50 ug via INTRAVENOUS
  Administered 2017-02-24 (×2): 100 ug via INTRAVENOUS
  Administered 2017-02-24 (×2): 50 ug via INTRAVENOUS
  Filled 2017-02-24 (×2): qty 2

## 2017-02-24 MED ORDER — FENTANYL CITRATE (PF) 100 MCG/2ML IJ SOLN
INTRAMUSCULAR | Status: AC
Start: 2017-02-24 — End: 2017-02-25
  Filled 2017-02-24: qty 2

## 2017-02-24 SURGICAL SUPPLY — 105 items
BAG DECANTER FOR FLEXI CONT (MISCELLANEOUS) ×3 IMPLANT
BAG SNAP BAND KOVER 36X36 (MISCELLANEOUS) ×3 IMPLANT
BALLN MUSTANG 10X20X75 (BALLOONS) ×3
BALLOON MUSTANG 10X20X75 (BALLOONS) ×2 IMPLANT
BANDAGE ACE 4X5 VEL STRL LF (GAUZE/BANDAGES/DRESSINGS) IMPLANT
BANDAGE ACE 6X5 VEL STRL LF (GAUZE/BANDAGES/DRESSINGS) IMPLANT
BLADE SURG 11 STRL SS (BLADE) IMPLANT
BNDG GAUZE ELAST 4 BULKY (GAUZE/BANDAGES/DRESSINGS) IMPLANT
CANISTER SUCT 3000ML PPV (MISCELLANEOUS) ×3 IMPLANT
CATH ANGIO 5F BER2 65CM (CATHETERS) ×3 IMPLANT
CATH EMB 2FR 60CM (CATHETERS) ×6 IMPLANT
CATH EMB 3FR 80CM (CATHETERS) ×6 IMPLANT
CATH EMB 4FR 80CM (CATHETERS) ×3 IMPLANT
CATH EMB 5FR 80CM (CATHETERS) IMPLANT
CATH OMNI FLUSH .035X70CM (CATHETERS) ×3 IMPLANT
CHLORAPREP W/TINT 26ML (MISCELLANEOUS) IMPLANT
CLIP TI MEDIUM 24 (CLIP) ×3 IMPLANT
CLIP TI WIDE RED SMALL 24 (CLIP) ×3 IMPLANT
CONT SPECI 4OZ STER CLIK (MISCELLANEOUS) ×6 IMPLANT
COVER DOME SNAP 22 D (MISCELLANEOUS) ×3 IMPLANT
COVER PROBE W GEL 5X96 (DRAPES) ×3 IMPLANT
COVER SURGICAL LIGHT HANDLE (MISCELLANEOUS) IMPLANT
DERMABOND ADHESIVE PROPEN (GAUZE/BANDAGES/DRESSINGS) ×3
DERMABOND ADVANCED (GAUZE/BANDAGES/DRESSINGS)
DERMABOND ADVANCED .7 DNX12 (GAUZE/BANDAGES/DRESSINGS) IMPLANT
DERMABOND ADVANCED .7 DNX6 (GAUZE/BANDAGES/DRESSINGS) ×6 IMPLANT
DEVICE INFLATION ENCORE 26 (MISCELLANEOUS) ×3 IMPLANT
DEVICE TORQUE KENDALL .025-038 (MISCELLANEOUS) ×3 IMPLANT
DRAIN CHANNEL 15F RND FF W/TCR (WOUND CARE) IMPLANT
DRAPE FEMORAL ANGIO 80X135IN (DRAPES) IMPLANT
DRAPE HALF SHEET 40X57 (DRAPES) IMPLANT
DRAPE INCISE IOBAN 66X45 STRL (DRAPES) IMPLANT
DRAPE ORTHO SPLIT 77X108 STRL (DRAPES)
DRAPE SURG ORHT 6 SPLT 77X108 (DRAPES) IMPLANT
DRAPE X-RAY CASS 24X20 (DRAPES) IMPLANT
DRSG ADAPTIC 3X8 NADH LF (GAUZE/BANDAGES/DRESSINGS) IMPLANT
ELECT REM PT RETURN 9FT ADLT (ELECTROSURGICAL) ×3
ELECTRODE REM PT RTRN 9FT ADLT (ELECTROSURGICAL) ×2 IMPLANT
EVACUATOR SILICONE 100CC (DRAIN) IMPLANT
GAUZE SPONGE 4X4 16PLY XRAY LF (GAUZE/BANDAGES/DRESSINGS) IMPLANT
GLOVE BIO SURGEON STRL SZ7.5 (GLOVE) ×6 IMPLANT
GLOVE BIOGEL PI IND STRL 6.5 (GLOVE) ×2 IMPLANT
GLOVE BIOGEL PI IND STRL 7.5 (GLOVE) ×4 IMPLANT
GLOVE BIOGEL PI INDICATOR 6.5 (GLOVE) ×1
GLOVE BIOGEL PI INDICATOR 7.5 (GLOVE) ×2
GLOVE ECLIPSE 7.0 STRL STRAW (GLOVE) ×3 IMPLANT
GLOVE SURG SS PI 7.5 STRL IVOR (GLOVE) ×6 IMPLANT
GOWN STRL REUS W/ TWL LRG LVL3 (GOWN DISPOSABLE) ×14 IMPLANT
GOWN STRL REUS W/ TWL XL LVL3 (GOWN DISPOSABLE) ×2 IMPLANT
GOWN STRL REUS W/TWL LRG LVL3 (GOWN DISPOSABLE) ×7
GOWN STRL REUS W/TWL XL LVL3 (GOWN DISPOSABLE) ×1
GUIDEWIRE ANGLED .035X150CM (WIRE) IMPLANT
GUIDEWIRE ANGLED .035X260CM (WIRE) ×3 IMPLANT
HEMOSTAT SNOW SURGICEL 2X4 (HEMOSTASIS) IMPLANT
HEMOSTAT SPONGE AVITENE ULTRA (HEMOSTASIS) ×3 IMPLANT
KIT BASIN OR (CUSTOM PROCEDURE TRAY) ×3 IMPLANT
KIT ROOM TURNOVER OR (KITS) ×3 IMPLANT
NEEDLE PERC 18GX7CM (NEEDLE) ×3 IMPLANT
NS IRRIG 1000ML POUR BTL (IV SOLUTION) IMPLANT
PACK GENERAL/GYN (CUSTOM PROCEDURE TRAY) IMPLANT
PACK PERIPHERAL VASCULAR (CUSTOM PROCEDURE TRAY) ×3 IMPLANT
PACK SURGICAL SETUP 50X90 (CUSTOM PROCEDURE TRAY) IMPLANT
PAD ARMBOARD 7.5X6 YLW CONV (MISCELLANEOUS) ×6 IMPLANT
PROTECTION STATION PRESSURIZED (MISCELLANEOUS)
SET COLLECT BLD 21X3/4 12 (NEEDLE) IMPLANT
SET COLLECT BLD 21X3/4 12 PB (MISCELLANEOUS) ×3 IMPLANT
SHEATH AVANTI 11CM 5FR (MISCELLANEOUS) ×3 IMPLANT
SHEATH AVANTI 11CM 8FR (MISCELLANEOUS) ×3 IMPLANT
SHEATH BRITE TIP 7FRX11 (SHEATH) ×3 IMPLANT
SHEATH PINNACLE R/O II 6F 4CM (SHEATH) ×3 IMPLANT
SPONGE GAUZE 4X4 12PLY STER LF (GAUZE/BANDAGES/DRESSINGS) IMPLANT
STATION PROTECTION PRESSURIZED (MISCELLANEOUS) IMPLANT
STENT ICAST 8X59X120 (Permanent Stent) ×3 IMPLANT
STOPCOCK 4 WAY LG BORE MALE ST (IV SETS) IMPLANT
STOPCOCK MORSE 400PSI 3WAY (MISCELLANEOUS) IMPLANT
SUT ETHILON 3 0 PS 1 (SUTURE) IMPLANT
SUT MNCRL AB 4-0 PS2 18 (SUTURE) ×9 IMPLANT
SUT PROLENE 5 0 C 1 24 (SUTURE) ×9 IMPLANT
SUT PROLENE 6 0 BV (SUTURE) ×15 IMPLANT
SUT PROLENE 6 0 CC (SUTURE) ×6 IMPLANT
SUT SILK 3 0 (SUTURE)
SUT SILK 3-0 18XBRD TIE 12 (SUTURE) IMPLANT
SUT VIC AB 2-0 CT1 27 (SUTURE) ×2
SUT VIC AB 2-0 CT1 TAPERPNT 27 (SUTURE) ×4 IMPLANT
SUT VIC AB 2-0 CTB1 (SUTURE) IMPLANT
SUT VIC AB 3-0 SH 27 (SUTURE) ×3
SUT VIC AB 3-0 SH 27X BRD (SUTURE) ×6 IMPLANT
SUT VICRYL 4-0 PS2 18IN ABS (SUTURE) IMPLANT
SYR 10ML LL (SYRINGE) ×9 IMPLANT
SYR 20CC LL (SYRINGE) ×6 IMPLANT
SYR 30ML LL (SYRINGE) IMPLANT
SYR 3ML LL SCALE MARK (SYRINGE) IMPLANT
SYR MEDRAD MARK V 150ML (SYRINGE) IMPLANT
SYRINGE 3CC LL L/F (MISCELLANEOUS) ×3 IMPLANT
TOWEL GREEN STERILE (TOWEL DISPOSABLE) ×3 IMPLANT
TOWEL OR 17X24 6PK STRL BLUE (TOWEL DISPOSABLE) ×3 IMPLANT
TOWEL OR 17X26 10 PK STRL BLUE (TOWEL DISPOSABLE) ×3 IMPLANT
TRAY FOLEY W/METER SILVER 16FR (SET/KITS/TRAYS/PACK) ×3 IMPLANT
TUBING EXTENTION W/L.L. (IV SETS) ×3 IMPLANT
TUBING HIGH PRESSURE 120CM (CONNECTOR) IMPLANT
UNDERPAD 30X30 (UNDERPADS AND DIAPERS) IMPLANT
WATER STERILE IRR 1000ML POUR (IV SOLUTION) ×3 IMPLANT
WIRE BENTSON .035X145CM (WIRE) ×3 IMPLANT
WIRE HI TORQ VERSACORE J 260CM (WIRE) ×3 IMPLANT
WIRE MINI STICK MAX (SHEATH) ×3 IMPLANT

## 2017-02-24 NOTE — ED Notes (Signed)
Pt attempted to give urine sample, unsuccessful. 

## 2017-02-24 NOTE — Anesthesia Procedure Notes (Deleted)
Procedures

## 2017-02-24 NOTE — Anesthesia Procedure Notes (Signed)
Procedure Name: Intubation Date/Time: 02/24/2017 6:56 PM Performed by: Teressa Lower Pre-anesthesia Checklist: Patient identified, Emergency Drugs available, Suction available and Patient being monitored Patient Re-evaluated:Patient Re-evaluated prior to inductionOxygen Delivery Method: Circle system utilized Preoxygenation: Pre-oxygenation with 100% oxygen Intubation Type: IV induction Ventilation: Mask ventilation without difficulty Laryngoscope Size: Mac and 3 Grade View: Grade I Tube type: Oral Tube size: 7.0 mm Number of attempts: 1 Airway Equipment and Method: Stylet and Oral airway Placement Confirmation: ETT inserted through vocal cords under direct vision,  positive ETCO2 and breath sounds checked- equal and bilateral Secured at: 21 cm Tube secured with: Tape Dental Injury: Teeth and Oropharynx as per pre-operative assessment

## 2017-02-24 NOTE — ED Notes (Addendum)
Glucose 521 critical lab. MD already aware

## 2017-02-24 NOTE — Transfer of Care (Signed)
Immediate Anesthesia Transfer of Care Note  Patient: Terri Sparks  Procedure(s) Performed: Procedure(s): Left Lower Extremity Embolectomy and Angiogram. (Left) Aortagram, Left lower extremity Run-off (Left) INSERTION OF Common ILIAC STENT  Patient Location: PACU  Anesthesia Type:General  Level of Consciousness: awake  Airway & Oxygen Therapy: Patient Spontanous Breathing and Patient connected to face mask oxygen  Post-op Assessment: Report given to RN and Post -op Vital signs reviewed and stable  Post vital signs: Reviewed and stable  Last Vitals:  Vitals:   02/24/17 1721 02/24/17 1730  BP: (!) 151/65 (!) 171/92  Pulse: 95 (!) 106  Resp: 19 (!) 21  Temp:      Last Pain:  Vitals:   02/24/17 1746  PainSc: 9          Complications: Pt with very poor dentition- multiple loose teeth with left front tooth very loose. Left front tooth came out uponm emergence. tooth placed in specimen cup and sent to PACU with pt

## 2017-02-24 NOTE — ED Notes (Signed)
Vascular at bedside

## 2017-02-24 NOTE — ED Notes (Signed)
Pt stated that she will attempt to urinate soon.

## 2017-02-24 NOTE — Progress Notes (Signed)
ANTICOAGULATION CONSULT NOTE - Initial Consult  Pharmacy Consult for heparin Indication: possible arterial clot  Allergies  Allergen Reactions  . Macrobid [Nitrofurantoin Macrocrystal] Rash  . Sulfa Antibiotics Rash    Patient Measurements: Height: 5\' 7"  (170.2 cm) Weight: 276 lb (125.2 kg) IBW/kg (Calculated) : 61.6 Heparin Dosing Weight: 91.5 kg  Vital Signs: Temp: 98.5 F (36.9 C) (05/24 1538) BP: 135/73 (05/24 1538) Pulse Rate: 101 (05/24 1538)  Labs: No results for input(s): HGB, HCT, PLT, APTT, LABPROT, INR, HEPARINUNFRC, HEPRLOWMOCWT, CREATININE, CKTOTAL, CKMB, TROPONINI in the last 72 hours.  CrCl cannot be calculated (Patient's most recent lab result is older than the maximum 21 days allowed.).   Medical History: Past Medical History:  Diagnosis Date  . Anxiety   . GERD (gastroesophageal reflux disease)   . History of blood clots   . Hypercalcemia   . Hyperparathyroidism   . Nephrolithiasis   . Renal calculi     Assessment: 41yo F with h/o of DVT not on anticoagulation PTA presents with left lower extremity pain. Pharmacy consulted to dose heparin. Hgb 13.6, Plt 294, no bleeding noted  Goal of Therapy:  Heparin level 0.3-0.7 units/ml Monitor platelets by anticoagulation protocol: Yes   Plan:  Give 6400 units bolus x 1 Start heparin infusion at 1600 units/hr Check anti-Xa level in 6 hours and daily while on heparin Continue to monitor H&H and platelets   Gwenlyn Perking, PharmD PGY1 Pharmacy Resident Rx ED [phone] 02/24/2017 4:32 PM

## 2017-02-24 NOTE — ED Triage Notes (Signed)
Per [facility], initailly called out for allergic reaction, took some generic pill with amoxacillin on Sunday, took benadryl every day since Sunday to keep the hives away. Lung sounds clear. Pt cbg 525, pt dx diabetic, but does not take anything for it, pt also c/o pain to L foot, pt L foot is white and pale with decreased pulses to L foot. Pt has hx of blood clots.

## 2017-02-24 NOTE — ED Notes (Signed)
Oral swab given to pt, per Otila Kluver - RN.

## 2017-02-24 NOTE — ED Notes (Signed)
Dr. Jethro Poling at bedside, unable to find pulses in L foot with doppler.

## 2017-02-24 NOTE — ED Notes (Signed)
Pt signed consent for vascular surgery

## 2017-02-24 NOTE — ED Notes (Signed)
Pt to CT

## 2017-02-24 NOTE — ED Notes (Signed)
Pt L foot is purple at this time

## 2017-02-24 NOTE — H&P (Signed)
ED consult    Reason for Consult:  Ischemic left leg Requesting Physician:  Verlene Mayer (ED) MRN #:  [id number]  History of Present Illness: This is a 41 y.o. female with history of diabetes and drug abuse presents with history of painful, cold, pale left foot and ankle since 1pm today. She has never had this before. Onset was abrupt. significant relief since heparin started. Associated rash on left leg that she attributes to recent drug abuse and has tried amoxicillin without resolution. Left foot now feels like pins and needles. Has remote history of dvt for which she took coumadin for designated amount of time. She is a current every day smoker.  Past Medical History:  Diagnosis Date  . Anxiety   . GERD (gastroesophageal reflux disease)   . History of blood clots   . Hypercalcemia   . Hyperparathyroidism   . Nephrolithiasis   . Renal calculi     Past Surgical History:  Procedure Laterality Date  . 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 TUBAL LIGATION      Allergies  Allergen Reactions  . Macrobid [Nitrofurantoin Macrocrystal] Rash  . Sulfa Antibiotics Rash    Prior to Admission medications   Medication Sig Start Date End Date Taking? Authorizing Provider  Ascorbic Acid (VITAMIN C) 1000 MG tablet Take 2,000 mg by mouth daily.    [provider]  cephALEXin (KEFLEX) 500 MG capsule Take 1 capsule (500 mg total) by mouth 4 (four) times daily. 10/10/16   Muthersbaugh, Jarrett Soho, PA-C  clotrimazole (LOTRIMIN) 1 % cream Apply to affected area 2 times daily 10/10/16   Muthersbaugh, Jarrett Soho, PA-C  gabapentin (NEURONTIN) 100 MG capsule Take 1 capsule (100 mg total) by mouth 2 (two) times daily. 10/10/16 10/25/16  Muthersbaugh, Jarrett Soho, PA-C  glucose blood test strip Use as instructed 10/10/16   Muthersbaugh, Jarrett Soho, PA-C  ibuprofen (ADVIL,MOTRIN) 200 MG tablet Take 400 mg by mouth every 6 (six) hours as needed for moderate pain (pain).     [provider]  metFORMIN (GLUCOPHAGE)  500 MG tablet Take 1 tablet (500 mg total) by mouth 2 (two) times daily with a meal. 10/10/16   Muthersbaugh, Jarrett Soho, PA-C  methocarbamol (ROBAXIN-750) 750 MG tablet Take 1 tablet (750 mg total) by mouth 4 (four) times daily. Patient not taking: Reported on 08/17/2015 06/04/15   Lacretia Leigh, MD  naproxen sodium (ANAPROX) 220 MG tablet Take 220 mg by mouth 2 (two) times daily as needed (pain).    [provider]  ondansetron (ZOFRAN) 4 MG tablet Take 1 tablet (4 mg total) by mouth every 6 (six) hours. 08/17/15   Patel-Mills, Orvil Feil, PA-C  predniSONE (DELTASONE) 20 MG tablet Take 2 tablets (40 mg total) by mouth daily. Patient not taking: Reported on 08/17/2015 06/04/15   Lacretia Leigh, MD  promethazine (PHENERGAN) 25 MG tablet Take 1 tablet (25 mg total) by mouth every 6 (six) hours as needed for nausea or vomiting. Patient not taking: Reported on 08/17/2015 06/04/15   Lacretia Leigh, MD    Social History   Social History  . Marital status: Single    Spouse name: N/A  . Number of children: N/A  . Years of education: N/A   Occupational History  . Not on file.   Social History Main Topics  . Smoking status: Current Every Day Smoker    Packs/day: 0.50    Years: 15.00    Types: Cigarettes  . Smokeless tobacco: Never Used  . Alcohol use No  . Drug  use: Yes    Types: Marijuana     Comment: occasionally  . Sexual activity: Not on file   Other Topics Concern  . Not on file   Social History Narrative  . No narrative on file     Family History  Problem Relation Age of Onset  . Depression Mother   . Diabetes Father   . Heart failure Father     ROS: [x]  Positive   [ ]  Negative   [ ]  All sytems reviewed and are negative  Cardiovascular: []  chest pain/pressure []  palpitations []  SOB lying flat []  DOE []  pain in legs while walking [x]  pain in legs at rest []  pain in legs at night []  non-healing ulcers []  hx of DVT []  swelling in legs  Pulmonary: []  productive  cough []  asthma/wheezing []  home O2  Neurologic: []  weakness in []  arms []  legs []  numbness in []  arms []  legs []  hx of CVA []  mini stroke [] difficulty speaking or slurred speech []  temporary loss of vision in one eye []  dizziness  Hematologic: []  hx of cancer []  bleeding problems [x]  problems with blood clotting easily  Endocrine:   [x]  diabetes []  thyroid disease  GI []  vomiting blood []  blood in stool  GU: []  CKD/renal failure []  HD--[]  M/W/F or []  T/T/S []  burning with urination []  blood in urine [x]  kidney stones  Psychiatric: [x]  anxiety [x]  depression  Musculoskeletal: []  arthritis []  joint pain  Integumentary: [x]  rashes []  ulcers  Constitutional: []  fever []  chills   Physical Examination  Vitals:   02/24/17 1721 02/24/17 1730  BP: (!) 151/65 (!) 171/92  Pulse: 95 (!) 106  Resp: 19 (!) 21  Temp:     Body mass index is 43.23 kg/m.  General:  WDWN in NAD Gait: observed, able to walk with left limp HENT: WNL, normocephalic Pulmonary: normal non-labored breathing Cardiac: rrr, weak signal at left dp Abdomen: soft, NT/ND, no masses Skin: punctate ulceration of left leg Extremities:ishcemic appearing changes to left foot Neurologic: motor in tact lle, sensation out from ankle down on left.  CBC    Component Value Date/Time   WBC 17.8 (H) 02/24/2017 1557   RBC 4.74 02/24/2017 1557   HGB 13.6 02/24/2017 1557   HCT 40.8 02/24/2017 1557   PLT 294 02/24/2017 1557   MCV 86.1 02/24/2017 1557   MCH 28.7 02/24/2017 1557   MCHC 33.3 02/24/2017 1557   RDW 14.4 02/24/2017 1557   LYMPHSABS 2.6 02/24/2017 1557   MONOABS 0.6 02/24/2017 1557   EOSABS 0.1 02/24/2017 1557   BASOSABS 0.0 02/24/2017 1557    BMET    Component Value Date/Time   NA 130 (L) 02/24/2017 1557   K 3.8 02/24/2017 1557   CL 98 (L) 02/24/2017 1557   CO2 18 (L) 02/24/2017 1557   GLUCOSE 521 (HH) 02/24/2017 1557   BUN 11 02/24/2017 1557   CREATININE 1.47 (H) 02/24/2017  1557   CALCIUM 8.7 (L) 02/24/2017 1557   GFRNONAA 43 (L) 02/24/2017 1557   GFRAA 50 (L) 02/24/2017 1557    COAGS: No results found for: INR, PROTIME   Non-Invasive Vascular Imaging:   CT angio reviewed with clot in left common iliac artery and left profunda. Tibial vessels on left difficult to evaluate    ASSESSMENT/PLAN: This is a 41 y.o. female with rutherford 2a ischemia and acute appearing clot in left common iliac artery and left profunda artery. Will need embolectomy with possible patch angioplasty of common femoral artery and  possible retrograde left iliac artery stenting. I have discussed the risks and benefits including possibility of amputation of her leg during this admission and significant risk of wound healing complications given size and poor blood glucose control. She demonstrates good understanding.   Brandon C. Donzetta Matters, MD [facility] Office: [phone] Pager: [phone]

## 2017-02-24 NOTE — Op Note (Signed)
Patient name: Terri Sparks MRN: [id number] DOB: 1976-09-03 Sex: female  02/24/2017 Pre-operative Diagnosis: acute left lower extremity ischemia Post-operative diagnosis:  Same Surgeon:  Erlene Quan C. Donzetta Matters, MD Assistant: Gerri Lins, PA Procedure Performed: 1.  Aortogram with left lower extremity angiogram 2.  Stent of left common iliac artery with 8 x 98mm iCast 3.  Left lower extremity thromboembolectomy via left common femoral exposure, left popliteal artery exposure and left posterior tibial exposure   Indications:  41 year old female presented to the emergency department with a few hour history of coolness of her left lower extremity with sensory loss and skin changes. With heparin she did improve she had CT angiogram demonstrating acute appearing thrombus in her left common iliac artery, her left profunda femoris artery and her tibial arteries. We discussed the risk benefits and alternatives to procedure including the risk of amputation in the risk of poor wound healing. She demonstrated good understanding and consent was signed.  Findings: There was significant thrombus removed from her iliac arteries on the left. Angiogram demonstrated residual thrombus in the left common iliac artery with an occluded left hypogastric artery. Runoff on the right was unobstructed. A cover stent was placed and postdilated that excluded the remaining thrombus. There was also significant thrombus removed and the profunda femoris artery on the left. Runoff then demonstrated flow through the profunda down through the SFA below-knee popliteal artery and all tibial arteries cut off in the calf with no reconstitution of her foot. An embolectomy was performed from below the knee with similar results on angiogram. We then performed embolectomy for posterior tibial approach at the level of the ankle but could not get return thrombus to clear from the foot and had no runoff. Completion angiogram demonstrated tibials  that bruptly end mid calf.   Procedure:  The patient was identified in the holding area and taken to the operating room where she was placed supine on the operating table and general endotracheal anesthesia was induced she was given antibiotics sterilely prepped and draped in the usual fashion and timeout called. She was already on a heparin drip. We made a longitudinal incision dissected down through skin simultaneously tissue identified our inguinal ligament and our common femoral external iliac artery confluence below. We dissected out the entirety of the common femoral artery placed vessel loops around this fundus and Morrison superficial femoral arteries. There was noted to be a pulse in this. We then clamped these vessels opened horizontally. We then performed embolectomy until return no further clot we did have significant and this was sent as specimen. We then turned our attention distally were passed the Fogarty down the SFA and returned no clot. We did also passed from the profunda and returned clots on 2 occasions but ultimately had brisk backbleeding. We then close our arteriotomy with 5-0 Prolene suture. The patient had been given additional boluses of heparin and remained heparinized throughout this case. We then cannulated the common femoral artery with 18-gauge needle Bentson wire placed a 5 French sheath. A Veress and catheter was used for merit aortogram with the above findings of residual thrombus left common iliac artery. With this we then exchanged for 7 French sheath and deployed an 8 x 59 mm I cast stent across this thrombus. Angiogram demonstrated there was residual narrowing of the vessel and this was postdilated with 10 mm balloon and then angiogram demonstrated brisk flow with no residual thrombus apparent. Satisfied with this we then performed left lower extremity angiography that  demonstrated the above findings of cut off of the tibial arteries. We then made an incision on the medial  leg below the knee to expose the popliteal artery. This was noted to be quite deep with significant layer of fat. We then opened the fascia retracted our gastrocnemius muscle posteriorly and took down some of the soleus muscle. Anterior tibialis and tributary veins were ligated between ties. This exposed the below-knee popliteal artery. We placed vessel loops around this as well as her anterior tibial and tibioperoneal trunk. We then opened transversely at the level of the anterior tibial artery and passed Fogarty catheters until no thrombus returned from the anterior tibial, peroneal, and posterior tibial arteries. We then closed this arteriotomy cannulated the popliteal artery with micropuncture sheath and performed angiography which again cut off all tibials in the mid calf area. Anterior tibial artery appeared to have minimal flow in the peroneal and posterior tibial seemed to be the dominant flow to the mid calf. With this we then elected to make an incision along the posterior tibial artery at the level of the ankle. This is identified with ultrasound we then made our pseudo-incision dissected down through the skin and subcutaneou  fat and fascia. The artery was identified and dissected free of the surrounding veins. Transverse arteriotomy was made and noted to have brisk antegrade bleeding but there was no backbleeding. A bulldog type clamp was placed on the proximal aspect of the artery. We then used to Fogarty passed down the posterior tibial artery and did return clot but ultimately could not pass it any further and had no backbleeding. With this we elected to close the posterior tibial artery with no further options. Heparin was not reversed. All incisions were closed in layers of Vicryl and Monocryl. Counts were correct at completion. Patient will need heparinization and will allow her foot and ankle to demarcate. She will also need echo CT scan to evaluate for source of thrombus.  Blood loss 750  mL.  Contrast 60 mL.  Specimen common femoral thrombus.    Carlen Fils C. Donzetta Matters, MD [facility] Office: [phone] Pager: [phone]

## 2017-02-24 NOTE — Anesthesia Preprocedure Evaluation (Addendum)
Anesthesia Evaluation  Patient identified by MRN, date of birth, ID band Patient awake    Reviewed: Allergy & Precautions, NPO status , Patient's Chart, lab work & pertinent test results  History of Anesthesia Complications Negative for: history of anesthetic complications  Airway Mallampati: II  TM Distance: >3 FB Neck ROM: Full    Dental  (+) Poor Dentition, Loose,    Pulmonary neg shortness of breath, neg COPD, neg recent URI, Current Smoker,    breath sounds clear to auscultation       Cardiovascular + Peripheral Vascular Disease   Rhythm:Regular Rate:Tachycardia     Neuro/Psych PSYCHIATRIC DISORDERS Anxiety negative neurological ROS     GI/Hepatic Neg liver ROS, GERD  ,  Endo/Other  diabetes, Poorly Controlled, Type 2, Insulin DependentMorbid obesity  Renal/GU Renal disease     Musculoskeletal   Abdominal   Peds  Hematology negative hematology ROS (+)   Anesthesia Other Findings   Reproductive/Obstetrics                             Anesthesia Physical Anesthesia Plan  ASA: IV and emergent  Anesthesia Plan: General   Post-op Pain Management:    Induction: Intravenous  Airway Management Planned: Oral ETT  Additional Equipment: Arterial line  Intra-op Plan:   Post-operative Plan: Extubation in OR and Possible Post-op intubation/ventilation  Informed Consent: I have reviewed the patients History and Physical, chart, labs and discussed the procedure including the risks, benefits and alternatives for the proposed anesthesia with the patient or authorized representative who has indicated his/her understanding and acceptance.   Dental advisory given  Plan Discussed with: CRNA and Surgeon  Anesthesia Plan Comments:         Anesthesia Quick Evaluation

## 2017-02-24 NOTE — ED Notes (Signed)
CT notified pt needs to be next in line for CT

## 2017-02-24 NOTE — ED Notes (Signed)
Pt's CBG result was 480. Informed Tina - RN.

## 2017-02-24 NOTE — ED Provider Notes (Signed)
[place] DEPT Provider Note   CSN: [id number] Arrival date & time: 02/24/17  1525     History   Chief Complaint Chief Complaint  Patient presents with  . Foot Pain  . Urticaria  . Hyperglycemia    HPI Terri Sparks is a 41 y.o. female.  Patient has multiple issues concurrently. Of most concern, patient reports that 2.5 hours prior to arrival she had abrupt onset of left lower extremity pain. She now reports that her foot is a white discoloration. Painful. She does have a history of DVT. Is not on any blood thinners. No trauma. Patient also started Keflex for a cellulitis a few days ago. Had onset of hives. Stopped taking the antibiotics after the hives started. Has had persistent waxing and waning hives on her extremities and trunk since that time. Taking Benadryl with improvement in itching sensation. No other symptoms concerning for anaphylaxis. Patient also has a history of diabetes. Has not been taking her medications for 2 months. Fingerstick with EMS was greater than 500. Denies any fevers or chills. Endorses nausea, no vomiting or diarrhea. Endorses polyuria, polydipsia.   The history is provided by the patient and the EMS personnel.  Illness  This is a new problem. Episode onset: 2.5 hours ago. The problem occurs constantly. The problem has not changed since onset.Pertinent negatives include no chest pain, no abdominal pain, no headaches and no shortness of breath. She has tried nothing for the symptoms.    Past Medical History:  Diagnosis Date  . Anxiety   . GERD (gastroesophageal reflux disease)   . History of blood clots   . Hypercalcemia   . Hyperparathyroidism   . Nephrolithiasis   . Renal calculi     Patient Active Problem List   Diagnosis Date Noted  . History of blood clots   . GERD (gastroesophageal reflux disease)   . Renal calculi   . Hyperparathyroidism   . Hypercalcemia   . Anxiety   . Nephrolithiasis     Past Surgical History:    Procedure Laterality Date  . 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 TUBAL LIGATION      OB History    No data available       Home Medications    Prior to Admission medications   Medication Sig Start Date End Date Taking? Authorizing Provider  Ascorbic Acid (VITAMIN C) 1000 MG tablet Take 2,000 mg by mouth daily.    [provider]  cephALEXin (KEFLEX) 500 MG capsule Take 1 capsule (500 mg total) by mouth 4 (four) times daily. 10/10/16   Muthersbaugh, Jarrett Soho, PA-C  clotrimazole (LOTRIMIN) 1 % cream Apply to affected area 2 times daily 10/10/16   Muthersbaugh, Jarrett Soho, PA-C  gabapentin (NEURONTIN) 100 MG capsule Take 1 capsule (100 mg total) by mouth 2 (two) times daily. 10/10/16 10/25/16  Muthersbaugh, Jarrett Soho, PA-C  glucose blood test strip Use as instructed 10/10/16   Muthersbaugh, Jarrett Soho, PA-C  ibuprofen (ADVIL,MOTRIN) 200 MG tablet Take 400 mg by mouth every 6 (six) hours as needed for moderate pain (pain).     [provider]  metFORMIN (GLUCOPHAGE) 500 MG tablet Take 1 tablet (500 mg total) by mouth 2 (two) times daily with a meal. 10/10/16   Muthersbaugh, Jarrett Soho, PA-C  methocarbamol (ROBAXIN-750) 750 MG tablet Take 1 tablet (750 mg total) by mouth 4 (four) times daily. Patient not taking: Reported on 08/17/2015 06/04/15   Lacretia Leigh, MD  naproxen sodium (ANAPROX) 220 MG tablet Take 220  mg by mouth 2 (two) times daily as needed (pain).    [provider]  ondansetron (ZOFRAN) 4 MG tablet Take 1 tablet (4 mg total) by mouth every 6 (six) hours. 08/17/15   Patel-Mills, Orvil Feil, PA-C  predniSONE (DELTASONE) 20 MG tablet Take 2 tablets (40 mg total) by mouth daily. Patient not taking: Reported on 08/17/2015 06/04/15   Lacretia Leigh, MD  promethazine (PHENERGAN) 25 MG tablet Take 1 tablet (25 mg total) by mouth every 6 (six) hours as needed for nausea or vomiting. Patient not taking: Reported on 08/17/2015 06/04/15   Lacretia Leigh, MD    Family History Family  History  Problem Relation Age of Onset  . Depression Mother   . Diabetes Father   . Heart failure Father     Social History Social History  Substance Use Topics  . Smoking status: Current Every Day Smoker    Packs/day: 0.50    Years: 15.00    Types: Cigarettes  . Smokeless tobacco: Never Used  . Alcohol use No     Allergies   Macrobid [nitrofurantoin macrocrystal] and Sulfa antibiotics   Review of Systems Review of Systems  Constitutional: Negative for chills and fever.  Respiratory: Negative for cough and shortness of breath.   Cardiovascular: Negative for chest pain and leg swelling.  Gastrointestinal: Positive for nausea. Negative for abdominal pain, blood in stool, diarrhea and vomiting.  Endocrine: Positive for polyuria.  Genitourinary: Positive for frequency. Negative for dysuria.  Musculoskeletal: Negative for back pain.  Skin: Positive for rash.  Neurological: Negative for light-headedness and headaches.  All other systems reviewed and are negative.    Physical Exam Updated Vital Signs BP (!) 151/65   Pulse 95   Temp 98.5 F (36.9 C)   Resp 19   Ht 5\' 7"  (1.702 m)   Wt 125.2 kg (276 lb)   LMP 02/03/2017   SpO2 100%   BMI 43.23 kg/m   Physical Exam  Constitutional: She appears well-developed and well-nourished. She appears ill. She appears distressed.  HENT:  Head: Normocephalic and atraumatic.  Mouth/Throat: Mucous membranes are dry.  Eyes: Conjunctivae are normal.  Neck: Neck supple.  Cardiovascular: Regular rhythm.  Tachycardia present.   No murmur heard. Pulmonary/Chest: Effort normal and breath sounds normal. No respiratory distress. She has no wheezes. She has no rhonchi.  Abdominal: Soft. There is no tenderness.  Musculoskeletal: She exhibits no edema.  Left foot with white discoloration from the mid foot and distal. Cold to touch. Decreased cap refill. Decreased sensation. Unable to Doppler or palpate pulses in the DP or PT reason.  Dopplerable popliteal artery.  Neurological: She is alert.  Skin: Skin is warm and dry. Rash noted. Rash is urticarial (To bilateral lower extremities, left greater than the right. Trunk.).  Psychiatric: She has a normal mood and affect.  Nursing note and vitals reviewed.    ED Treatments / Results  Labs (all labs ordered are listed, but only abnormal results are displayed) Labs Reviewed  CBC WITH DIFFERENTIAL/PLATELET - Abnormal; Notable for the following:       Result Value   WBC 17.8 (*)    Neutro Abs 14.5 (*)    All other components within normal limits  COMPREHENSIVE METABOLIC PANEL - Abnormal; Notable for the following:    Sodium 130 (*)    Chloride 98 (*)    CO2 18 (*)    Glucose, Bld 521 (*)    Creatinine, Ser 1.47 (*)    Calcium 8.7 (*)  Total Protein 6.3 (*)    Albumin 2.9 (*)    GFR calc non Af Amer 43 (*)    GFR calc Af Amer 50 (*)    All other components within normal limits  CBG MONITORING, ED - Abnormal; Notable for the following:    Glucose-Capillary 534 (*)    All other components within normal limits  I-STAT VENOUS BLOOD GAS, ED - Abnormal; Notable for the following:    pH, Ven 7.489 (*)    pCO2, Ven 22.0 (*)    pO2, Ven 57.0 (*)    Bicarbonate 16.7 (*)    Acid-base deficit 4.0 (*)    All other components within normal limits  I-STAT CG4 LACTIC ACID, ED - Abnormal; Notable for the following:    Lactic Acid, Venous 3.42 (*)    All other components within normal limits  URINALYSIS, ROUTINE W REFLEX MICROSCOPIC  HEPARIN LEVEL (UNFRACTIONATED)  HEPARIN LEVEL (UNFRACTIONATED)  CBC  I-STAT BETA HCG BLOOD, ED ([facility], [facility], [facility] ONLY)  CBG MONITORING, ED    EKG  EKG Interpretation None       Radiology No results found.  Procedures Procedures (including critical care time)  Medications Ordered in ED Medications  fentaNYL (SUBLIMAZE) injection 50 mcg (50 mcg Intravenous Given 02/24/17 1714)  heparin ADULT infusion 100 units/mL (25000 units/261mL  sodium chloride 0.45%) (1,600 Units/hr Intravenous New Bag/Given 02/24/17 1606)  iopamidol (ISOVUE-370) 76 % injection (not administered)  insulin aspart (novoLOG) injection 10 Units (not administered)  diphenhydrAMINE (BENADRYL) injection 50 mg (50 mg Intravenous Given 02/24/17 1602)  heparin bolus via infusion 6,400 Units (6,400 Units Intravenous Bolus from Bag 02/24/17 1607)  iopamidol (ISOVUE-370) 76 % injection (125 mLs Intravenous Contrast Given 02/24/17 1653)  sodium chloride 0.9 % bolus 1,000 mL (1,000 mLs Intravenous New Bag/Given 02/24/17 1721)     Initial Impression / Assessment and Plan / ED Course  I have reviewed the triage vital signs and the nursing notes.  Pertinent labs & imaging results that were available during my care of the patient were reviewed by me and considered in my medical decision making (see chart for details).     Concern for ischemic foot for the patient. Vascular was immediately consult, and the patient was started on heparin infusion. Final read on the CT angiogram had not returned, but on my evaluation appears to have cut off about midway down the tib/fib. After vascular surgery evaluated the patient, they will take the patient emergently to the operating room.  As far as the patient's hives, patient does not have systemic symptoms to indicate anaphylaxis. Given Benadryl. We'll continue to watch closely.  As far as the patient's hyperglycemia, does not have clinical signs of diabetic ketoacidosis or HHS. Normal anion gap of 14, but she is acidotic to bicarb of 18. Initially considered starting the patient on an insulin infusion, but after speaking with vascular surgery who will be and hurting the patient's care brain the patient to the operating room, will instead just give the patient IV fluids and subcutaneous insulin and watch her fingersticks closely.  Final Clinical Impressions(s) / ED Diagnoses   Final diagnoses:  Left leg pain  Lower limb ischemia    Hyperglycemia  Lactic acidosis    New Prescriptions New Prescriptions   No medications on file     Maryan Puls, MD 02/24/17 1742    Fredia Sorrow, MD 02/24/17 [phone]

## 2017-02-25 ENCOUNTER — Encounter (HOSPITAL_COMMUNITY): Payer: Self-pay | Admitting: Family Medicine

## 2017-02-25 DIAGNOSIS — N390 Urinary tract infection, site not specified: Secondary | ICD-10-CM | POA: Diagnosis present

## 2017-02-25 DIAGNOSIS — E119 Type 2 diabetes mellitus without complications: Secondary | ICD-10-CM

## 2017-02-25 DIAGNOSIS — I745 Embolism and thrombosis of iliac artery: Principal | ICD-10-CM

## 2017-02-25 DIAGNOSIS — Z720 Tobacco use: Secondary | ICD-10-CM | POA: Diagnosis present

## 2017-02-25 LAB — POCT I-STAT 4, (NA,K, GLUC, HGB,HCT)
Glucose, Bld: 383 mg/dL — ABNORMAL HIGH (ref 65–99)
Glucose, Bld: 386 mg/dL — ABNORMAL HIGH (ref 65–99)
Glucose, Bld: 300 mg/dL — ABNORMAL HIGH (ref 65–99)
Glucose, Bld: 236 mg/dL — ABNORMAL HIGH (ref 65–99)
HCT: 38 % (ref 36.0–46.0)
HCT: 37 % (ref 36.0–46.0)
HCT: 35 % — ABNORMAL LOW (ref 36.0–46.0)
HCT: 33 % — ABNORMAL LOW (ref 36.0–46.0)
Hemoglobin: 12.9 g/dL (ref 12.0–15.0)
Hemoglobin: 12.6 g/dL (ref 12.0–15.0)
Hemoglobin: 11.9 g/dL — ABNORMAL LOW (ref 12.0–15.0)
Hemoglobin: 11.2 g/dL — ABNORMAL LOW (ref 12.0–15.0)
Potassium: 4 mmol/L (ref 3.5–5.1)
Potassium: 4.3 mmol/L (ref 3.5–5.1)
Potassium: 4.3 mmol/L (ref 3.5–5.1)
Potassium: 4.5 mmol/L (ref 3.5–5.1)
Sodium: 134 mmol/L — ABNORMAL LOW (ref 135–145)
Sodium: 133 mmol/L — ABNORMAL LOW (ref 135–145)
Sodium: 134 mmol/L — ABNORMAL LOW (ref 135–145)
Sodium: 135 mmol/L (ref 135–145)

## 2017-02-25 LAB — GLUCOSE, CAPILLARY
Glucose-Capillary: 110 mg/dL — ABNORMAL HIGH (ref 65–99)
Glucose-Capillary: 114 mg/dL — ABNORMAL HIGH (ref 65–99)
Glucose-Capillary: 131 mg/dL — ABNORMAL HIGH (ref 65–99)
Glucose-Capillary: 137 mg/dL — ABNORMAL HIGH (ref 65–99)
Glucose-Capillary: 145 mg/dL — ABNORMAL HIGH (ref 65–99)
Glucose-Capillary: 152 mg/dL — ABNORMAL HIGH (ref 65–99)
Glucose-Capillary: 152 mg/dL — ABNORMAL HIGH (ref 65–99)
Glucose-Capillary: 153 mg/dL — ABNORMAL HIGH (ref 65–99)
Glucose-Capillary: 155 mg/dL — ABNORMAL HIGH (ref 65–99)
Glucose-Capillary: 163 mg/dL — ABNORMAL HIGH (ref 65–99)
Glucose-Capillary: 209 mg/dL — ABNORMAL HIGH (ref 65–99)
Glucose-Capillary: 215 mg/dL — ABNORMAL HIGH (ref 65–99)
Glucose-Capillary: 235 mg/dL — ABNORMAL HIGH (ref 65–99)
Glucose-Capillary: 237 mg/dL — ABNORMAL HIGH (ref 65–99)
Glucose-Capillary: 292 mg/dL — ABNORMAL HIGH (ref 65–99)

## 2017-02-25 LAB — BASIC METABOLIC PANEL
Anion gap: 8 (ref 5–15)
BUN: 11 mg/dL (ref 6–20)
CO2: 21 mmol/L — ABNORMAL LOW (ref 22–32)
Calcium: 8 mg/dL — ABNORMAL LOW (ref 8.9–10.3)
Chloride: 105 mmol/L (ref 101–111)
Creatinine, Ser: 1.23 mg/dL — ABNORMAL HIGH (ref 0.44–1.00)
GFR calc Af Amer: >60 mL/min (ref >60)
GFR calc non Af Amer: 53 mL/min — ABNORMAL LOW (ref >60)
Glucose, Bld: 106 mg/dL — ABNORMAL HIGH (ref 65–99)
Potassium: 3.6 mmol/L (ref 3.5–5.1)
Sodium: 134 mmol/L — ABNORMAL LOW (ref 135–145)

## 2017-02-25 LAB — HEPARIN LEVEL (UNFRACTIONATED)
Heparin Unfractionated: 0.21 IU/mL — ABNORMAL LOW (ref 0.30–0.70)
Heparin Unfractionated: 0.28 IU/mL — ABNORMAL LOW (ref 0.30–0.70)

## 2017-02-25 LAB — CBC
HCT: 31.5 % — ABNORMAL LOW (ref 36.0–46.0)
Hemoglobin: 10.2 g/dL — ABNORMAL LOW (ref 12.0–15.0)
MCH: 27.7 pg (ref 26.0–34.0)
MCHC: 32.1 g/dL (ref 30.0–36.0)
MCV: 86.3 fL (ref 78.0–100.0)
Platelets: 273 10*3/uL (ref 150–400)
RBC: 3.65 MIL/uL — ABNORMAL LOW (ref 3.87–5.11)
RDW: 14.7 % (ref 11.5–15.5)
WBC: 23.5 10*3/uL — ABNORMAL HIGH (ref 4.0–10.5)

## 2017-02-25 LAB — MRSA PCR SCREENING: MRSA by PCR: POSITIVE — AB

## 2017-02-25 MED ORDER — OXYCODONE HCL 5 MG PO TABS
ORAL_TABLET | ORAL | Status: AC
  Filled 2017-02-25: qty 1

## 2017-02-25 MED ORDER — PANTOPRAZOLE SODIUM 40 MG PO TBEC
40.0000 mg | DELAYED_RELEASE_TABLET | Freq: Every day | ORAL | Status: DC
  Administered 2017-02-25 – 2017-03-03 (×7): 40 mg via ORAL
  Filled 2017-02-25 (×7): qty 1

## 2017-02-25 MED ORDER — ACETAMINOPHEN 650 MG RE SUPP: 325.0000 mg | RECTAL | Status: DC | PRN

## 2017-02-25 MED ORDER — MAGNESIUM SULFATE 2 GM/50ML IV SOLN
2.0000 g | Freq: Every day | INTRAVENOUS | Status: DC | PRN
  Filled 2017-02-25: qty 50

## 2017-02-25 MED ORDER — ALUM & MAG HYDROXIDE-SIMETH 200-200-20 MG/5ML PO SUSP
15.0000 mL | ORAL | Status: DC | PRN
  Administered 2017-02-26 – 2017-03-03 (×18): 30 mL via ORAL
  Filled 2017-02-25 (×19): qty 30

## 2017-02-25 MED ORDER — DIPHENHYDRAMINE HCL 25 MG PO CAPS
25.0000 mg | ORAL_CAPSULE | Freq: Four times a day (QID) | ORAL | Status: DC | PRN
  Administered 2017-02-25 – 2017-02-27 (×4): 25 mg via ORAL
  Filled 2017-02-25 (×4): qty 1

## 2017-02-25 MED ORDER — HEPARIN (PORCINE) IN NACL 100-0.45 UNIT/ML-% IJ SOLN
800.0000 [IU]/h | INTRAMUSCULAR | Status: DC
  Administered 2017-02-25: 800 [IU]/h via INTRAVENOUS

## 2017-02-25 MED ORDER — HEPARIN (PORCINE) IN NACL 100-0.45 UNIT/ML-% IJ SOLN
2550.0000 [IU]/h | INTRAMUSCULAR | Status: DC
  Administered 2017-02-25 (×2): 1600 [IU]/h via INTRAVENOUS
  Administered 2017-02-26: 2200 [IU]/h via INTRAVENOUS
  Administered 2017-02-26: 1950 [IU]/h via INTRAVENOUS
  Administered 2017-02-27 – 2017-02-28 (×3): 2450 [IU]/h via INTRAVENOUS
  Administered 2017-03-01 (×2): 2550 [IU]/h via INTRAVENOUS
  Administered 2017-03-01: 2450 [IU]/h via INTRAVENOUS
  Filled 2017-02-25 (×11): qty 250

## 2017-02-25 MED ORDER — CLONAZEPAM 0.5 MG PO TABS
0.5000 mg | ORAL_TABLET | Freq: Two times a day (BID) | ORAL | Status: DC | PRN
  Administered 2017-02-25 – 2017-02-28 (×7): 0.5 mg via ORAL
  Filled 2017-02-25 (×7): qty 1

## 2017-02-25 MED ORDER — SODIUM CHLORIDE 0.9 % IV SOLN: 500.0000 mL | Freq: Once | INTRAVENOUS | Status: DC | PRN

## 2017-02-25 MED ORDER — INSULIN GLARGINE 100 UNIT/ML ~~LOC~~ SOLN
20.0000 [IU] | Freq: Every day | SUBCUTANEOUS | Status: DC
  Administered 2017-02-25: 20 [IU] via SUBCUTANEOUS
  Filled 2017-02-25 (×2): qty 0.2

## 2017-02-25 MED ORDER — CHLORHEXIDINE GLUCONATE CLOTH 2 % EX PADS
6.0000 | MEDICATED_PAD | Freq: Every day | CUTANEOUS | Status: AC
  Administered 2017-02-25 – 2017-03-01 (×5): 6 via TOPICAL

## 2017-02-25 MED ORDER — HYDRALAZINE HCL 20 MG/ML IJ SOLN: 5.0000 mg | INTRAMUSCULAR | Status: DC | PRN

## 2017-02-25 MED ORDER — NICOTINE 21 MG/24HR TD PT24
21.0000 mg | MEDICATED_PATCH | Freq: Every day | TRANSDERMAL | Status: DC
  Administered 2017-02-25 – 2017-03-03 (×7): 21 mg via TRANSDERMAL
  Filled 2017-02-25 (×7): qty 1

## 2017-02-25 MED ORDER — OXYCODONE-ACETAMINOPHEN 5-325 MG PO TABS
1.0000 | ORAL_TABLET | ORAL | Status: DC | PRN
  Administered 2017-02-25 – 2017-03-03 (×31): 2 via ORAL
  Filled 2017-02-25 (×33): qty 2

## 2017-02-25 MED ORDER — CEFTRIAXONE SODIUM 1 G IJ SOLR
1.0000 g | INTRAMUSCULAR | Status: AC
  Administered 2017-02-25 – 2017-03-01 (×5): 1 g via INTRAVENOUS
  Filled 2017-02-25 (×6): qty 10

## 2017-02-25 MED ORDER — METOPROLOL TARTRATE 5 MG/5ML IV SOLN: 2.0000 mg | INTRAVENOUS | Status: DC | PRN

## 2017-02-25 MED ORDER — MORPHINE SULFATE (PF) 4 MG/ML IV SOLN
2.0000 mg | INTRAVENOUS | Status: DC | PRN
  Administered 2017-02-25 (×5): 4 mg via INTRAVENOUS
  Administered 2017-02-25: 5 mg via INTRAVENOUS
  Administered 2017-02-25 – 2017-02-26 (×6): 4 mg via INTRAVENOUS
  Administered 2017-02-27: 2 mg via INTRAVENOUS
  Administered 2017-02-27 – 2017-03-02 (×5): 4 mg via INTRAVENOUS
  Filled 2017-02-25 (×17): qty 1
  Filled 2017-02-25: qty 2
  Filled 2017-02-25 (×3): qty 1

## 2017-02-25 MED ORDER — BISACODYL 5 MG PO TBEC
5.0000 mg | DELAYED_RELEASE_TABLET | Freq: Every day | ORAL | Status: DC | PRN
  Administered 2017-03-01 – 2017-03-03 (×2): 5 mg via ORAL
  Filled 2017-02-25 (×2): qty 1

## 2017-02-25 MED ORDER — GUAIFENESIN-DM 100-10 MG/5ML PO SYRP: 15.0000 mL | ORAL_SOLUTION | ORAL | Status: DC | PRN

## 2017-02-25 MED ORDER — HEPARIN BOLUS VIA INFUSION
1250.0000 [IU] | Freq: Once | INTRAVENOUS | Status: AC
  Administered 2017-02-25: 1250 [IU] via INTRAVENOUS
  Filled 2017-02-25: qty 1250

## 2017-02-25 MED ORDER — ASPIRIN EC 81 MG PO TBEC
81.0000 mg | DELAYED_RELEASE_TABLET | Freq: Every day | ORAL | Status: DC
  Administered 2017-02-25 – 2017-03-03 (×7): 81 mg via ORAL
  Filled 2017-02-25 (×7): qty 1

## 2017-02-25 MED ORDER — ACETAMINOPHEN 325 MG PO TABS
325.0000 mg | ORAL_TABLET | ORAL | Status: DC | PRN
  Administered 2017-03-01: 650 mg via ORAL
  Filled 2017-02-25: qty 2

## 2017-02-25 MED ORDER — MUPIROCIN 2 % EX OINT
1.0000 "application " | TOPICAL_OINTMENT | Freq: Two times a day (BID) | CUTANEOUS | Status: AC
  Administered 2017-02-25 – 2017-03-01 (×10): 1 via NASAL
  Filled 2017-02-25 (×5): qty 22

## 2017-02-25 MED ORDER — POTASSIUM CHLORIDE CRYS ER 20 MEQ PO TBCR: 20.0000 meq | EXTENDED_RELEASE_TABLET | Freq: Every day | ORAL | Status: DC | PRN

## 2017-02-25 MED ORDER — LABETALOL HCL 5 MG/ML IV SOLN: 10.0000 mg | INTRAVENOUS | Status: DC | PRN

## 2017-02-25 MED ORDER — SENNOSIDES-DOCUSATE SODIUM 8.6-50 MG PO TABS
1.0000 | ORAL_TABLET | Freq: Every evening | ORAL | Status: DC | PRN
  Administered 2017-03-02: 1 via ORAL
  Filled 2017-02-25: qty 1

## 2017-02-25 MED ORDER — PHENOL 1.4 % MT LIQD
1.0000 | OROMUCOSAL | Status: DC | PRN
  Administered 2017-02-25: 1 via OROMUCOSAL
  Filled 2017-02-25: qty 177

## 2017-02-25 MED ORDER — DEXTROSE 5 % IV SOLN
1.5000 g | Freq: Two times a day (BID) | INTRAVENOUS | Status: AC
  Administered 2017-02-25 (×2): 1.5 g via INTRAVENOUS
  Filled 2017-02-25 (×2): qty 1.5

## 2017-02-25 MED ORDER — DOCUSATE SODIUM 100 MG PO CAPS
100.0000 mg | ORAL_CAPSULE | Freq: Every day | ORAL | Status: DC
  Administered 2017-02-25 – 2017-03-03 (×7): 100 mg via ORAL
  Filled 2017-02-25 (×8): qty 1

## 2017-02-25 MED ORDER — INSULIN ASPART 100 UNIT/ML ~~LOC~~ SOLN
0.0000 [IU] | Freq: Three times a day (TID) | SUBCUTANEOUS | Status: DC
  Administered 2017-02-25 (×2): 5 [IU] via SUBCUTANEOUS
  Administered 2017-02-26: 3 [IU] via SUBCUTANEOUS
  Administered 2017-02-26: 8 [IU] via SUBCUTANEOUS
  Administered 2017-02-26 – 2017-02-27 (×2): 5 [IU] via SUBCUTANEOUS
  Administered 2017-02-27: 8 [IU] via SUBCUTANEOUS
  Administered 2017-02-27 – 2017-02-28 (×2): 5 [IU] via SUBCUTANEOUS
  Administered 2017-02-28: 3 [IU] via SUBCUTANEOUS
  Administered 2017-02-28: 11 [IU] via SUBCUTANEOUS
  Administered 2017-03-01: 2 [IU] via SUBCUTANEOUS
  Administered 2017-03-01: 3 [IU] via SUBCUTANEOUS
  Administered 2017-03-02: 5 [IU] via SUBCUTANEOUS
  Administered 2017-03-02 (×2): 2 [IU] via SUBCUTANEOUS
  Administered 2017-03-03: 3 [IU] via SUBCUTANEOUS
  Administered 2017-03-03: 5 [IU] via SUBCUTANEOUS

## 2017-02-25 NOTE — Progress Notes (Signed)
[place] for heparin Indication: possible arterial clot  Allergies  Allergen Reactions  . Macrobid [Nitrofurantoin Macrocrystal] Hives, Shortness Of Breath and Rash  . Other Hives, Shortness Of Breath and Rash    NO "-CILLINS"!!!  . Sulfa Antibiotics Hives, Shortness Of Breath and Rash    Patient Measurements: Height: 5\' 7"  (170.2 cm) Weight: 276 lb (125.2 kg) IBW/kg (Calculated) : 61.6 Heparin Dosing Weight: 91.5 kg  Vital Signs: Temp: 97.6 F (36.4 C) (05/25 0100) Temp Source: Oral (05/25 0100) BP: 128/68 (05/25 0100) Pulse Rate: 101 (05/25 0100)  Labs:  Recent Labs  02/24/17 1557  HGB 13.6  HCT 40.8  PLT 294  CREATININE 1.47*    Estimated Creatinine Clearance: 68.5 mL/min (A) (by C-G formula based on SCr of 1.47 mg/dL (H)).   Medical History: Past Medical History:  Diagnosis Date  . Anxiety   . GERD (gastroesophageal reflux disease)   . History of blood clots   . Hypercalcemia   . Hyperparathyroidism   . Nephrolithiasis   . Renal calculi     Assessment: 41yo F with h/o of DVT not on anticoagulation PTA presents with left lower extremity pain. Pharmacy consulted to dose heparin. Hgb 13.6, Plt 294, no bleeding noted  S/p thromboembolectomy on 5/24. Came back from OR on heparin infusion at 800 units/hr; to increase back to treatment dose heparin for arterial thrombus at 0300. CBC stable.   Goal of Therapy:  Heparin level 0.3-0.7 units/ml Monitor platelets by anticoagulation protocol: Yes   Plan:  1. Increase heparin infusion back to 1600 units/hr at 0300 this am  2. Heparin level later in the am    Vincenza Hews, PharmD, BCPS 02/25/2017, 2:31 AM

## 2017-02-25 NOTE — Evaluation (Signed)
Physical Therapy Evaluation Patient Details Name: Terri Sparks MRN: [id number] DOB: [birth year] Today's Date: 02/25/2017   History of Present Illness  This is a 41 y.o. female with history of diabetes and drug abuse presents with history of painful, cold, pale left foot and ankle no w/p Stent of left common iliac artery and Left lower extremity thromboembolectomy via left common femoral exposure, left popliteal artery exposure and left posterior tibial exposure  Clinical Impression  Patient presents with decreased independence with mobility due to deficits listed in PT problem list.  She was independent prior to admission and now needing minguard to supervision for ambulation to bathroom with RW.  Mainly limited by pain at this time. She will benefit from skilled PT in the acute setting to allow return home with intermittent family support and, if able, follow up HHPT.     Follow Up Recommendations Home health PT    Equipment Recommendations  Rolling walker with 5" wheels    Recommendations for Other Services       Precautions / Restrictions Precautions Precautions: Fall Restrictions Weight Bearing Restrictions: No      Mobility  Bed Mobility Overal bed mobility: Modified Independent                Transfers Overall transfer level: Needs assistance Equipment used: Rolling walker (2 wheeled) Transfers: Sit to/from Stand Sit to Stand: Min guard;Supervision         General transfer comment: minguard for safety, lines, then pt asked to try by herself from toilet with grabbar  Ambulation/Gait Ambulation/Gait assistance: Min guard Ambulation Distance (Feet): 15 Feet (x 2) Assistive device: Rolling walker (2 wheeled) Gait Pattern/deviations: Step-to pattern;Decreased stride length;Antalgic     General Gait Details: limited by pain L foot, but without much assist, mainly due to lines, etc  Stairs            Wheelchair Mobility    Modified Rankin (Stroke  Patients Only)       Balance Overall balance assessment: Needs assistance   Sitting balance-Leahy Scale: Good     Standing balance support: No upper extremity supported;During functional activity Standing balance-Leahy Scale: Fair Standing balance comment: using hand gel after toileting most of weight on R foot                             Pertinent Vitals/Pain Pain Assessment: 0-10 Pain Score: 8  Pain Location: L LE esp with weight bearing Pain Descriptors / Indicators: Aching;Sore;Guarding Pain Intervention(s): Monitored during session;Limited activity within patient's tolerance;Repositioned    Home Living Family/patient expects to be discharged to:: Private residence Living Arrangements: Children Available Help at Discharge: Family Type of Home: House Home Access: Stairs to enter Entrance Stairs-Rails: Right Entrance Stairs-Number of Steps: 3 Home Layout: One level Home Equipment: None      Prior Function Level of Independence: Independent               Hand Dominance        Extremity/Trunk Assessment   Upper Extremity Assessment Upper Extremity Assessment: Overall WFL for tasks assessed    Lower Extremity Assessment Lower Extremity Assessment: LLE deficits/detail LLE Deficits / Details: AAROM WFL with pain and patchy errythema    Cervical / Trunk Assessment Cervical / Trunk Assessment: Normal  Communication   Communication: No difficulties  Cognition Arousal/Alertness: Awake/alert Behavior During Therapy: WFL for tasks assessed/performed;Anxious Overall Cognitive Status: Within Functional Limits for tasks assessed  General Comments      Exercises     Assessment/Plan    PT Assessment Patient needs continued PT services  PT Problem List Decreased mobility;Pain;Decreased knowledge of use of DME;Decreased activity tolerance;Decreased balance;Decreased knowledge of  precautions       PT Treatment Interventions DME instruction;Gait training;Stair training;Functional mobility training;Therapeutic exercise;Patient/family education;Therapeutic activities    PT Goals (Current goals can be found in the Care Plan section)  Acute Rehab PT Goals Patient Stated Goal: To return to independent PT Goal Formulation: With patient Time For Goal Achievement: 03/04/17 Potential to Achieve Goals: Good    Frequency Min 3X/week   Barriers to discharge        Co-evaluation               AM-PAC PT "6 Clicks" Daily Activity  Outcome Measure Difficulty turning over in bed (including adjusting bedclothes, sheets and blankets)?: None Difficulty moving from lying on back to sitting on the side of the bed? : None Difficulty sitting down on and standing up from a chair with arms (e.g., wheelchair, bedside commode, etc,.)?: A Little Help needed moving to and from a bed to chair (including a wheelchair)?: A Little Help needed walking in hospital room?: A Little Help needed climbing 3-5 steps with a railing? : A Lot 6 Click Score: 19    End of Session Equipment Utilized During Treatment: Gait belt Activity Tolerance: Patient limited by pain Patient left: in chair;with call bell/phone within reach   PT Visit Diagnosis: Difficulty in walking, not elsewhere classified (R26.2);Pain Pain - Right/Left: Left Pain - part of body: Ankle and joints of foot    Time: 8563-1497 PT Time Calculation (min) (ACUTE ONLY): 31 min   Charges:   PT Evaluation $PT Eval Moderate Complexity: 1 Procedure PT Treatments $Gait Training: 8-22 mins   PT G CodesMagda Kiel, [place] [phone] 02/25/2017   Reginia Naas 02/25/2017, 10:04 AM

## 2017-02-25 NOTE — Progress Notes (Signed)
[facility] for heparin Indication: possible arterial clot  Allergies  Allergen Reactions  . Macrobid [Nitrofurantoin Macrocrystal] Hives, Shortness Of Breath and Rash  . Other Hives, Shortness Of Breath and Rash    NO "-CILLINS"!!!  . Penicillins Shortness Of Breath    Had had cephalosporins without incident  . Sulfa Antibiotics Hives, Shortness Of Breath and Rash    Patient Measurements: Height: 5\' 7"  (170.2 cm) Weight: 276 lb (125.2 kg) IBW/kg (Calculated) : 61.6 Heparin Dosing Weight: 91.5 kg  Vital Signs: Temp: 97.9 F (36.6 C) (05/25 1713) Temp Source: Oral (05/25 1713) BP: 124/62 (05/25 1713) Pulse Rate: 108 (05/25 1713)  Labs:  Recent Labs  02/24/17 1557  02/24/17 2046 02/24/17 2159 02/25/17 0553 02/25/17 1025 02/25/17 1822  HGB 13.6  < > 11.9* 11.2* 10.2*  --   --   HCT 40.8  < > 35.0* 33.0* 31.5*  --   --   PLT 294  --   --   --  273  --   --   HEPARINUNFRC  --   --   --   --   --  0.21* 0.28*  CREATININE 1.47*  --   --   --  1.23*  --   --   < > = values in this interval not displayed.  Estimated Creatinine Clearance: 81.8 mL/min (A) (by C-G formula based on SCr of 1.23 mg/dL (H)).  Assessment: 41yo F with h/o of DVT not on anticoagulation PTA presents with left lower extremity pain.   Anticoag: hep for DVT post embolectomy; none pta Hep gtt 1750 units/hr with lvl 0.28  Renal: SCr 1.23  Heme/Onc: H&H 10.2/31.5, Plt 273  Goal of Therapy:  Heparin level 0.3-0.7 units/ml Monitor platelets by anticoagulation protocol: Yes   Plan:  Increase heparin infusion to 1950 units/hr Daily HL, CBC F/U plans for [facility]  Erin Hearing PharmD., BCPS Clinical Pharmacist Pager [phone] 02/25/2017 7:02 PM

## 2017-02-25 NOTE — Evaluation (Signed)
Occupational Therapy Evaluation Patient Details Name: Terri Sparks MRN: [id number] DOB: 1976-07-04 Today's Date: 02/25/2017    History of Present Illness This is a 41 y.o. female with history of diabetes and drug abuse presents with history of painful, cold, pale left foot and ankle no w/p Stent of left common iliac artery and Left lower extremity thromboembolectomy via left common femoral exposure, left popliteal artery exposure and left posterior tibial exposure   Clinical Impression   This 41 yo female admitted and underwent above presents to acute OT with deficits below (see OT problem list) thus affecting her PLOF of totally independent with basic and IADLs. She will benefit from acute OT without need for follow up.    Follow Up Recommendations  No OT follow up;Supervision/Assistance - 24 hour    Equipment Recommendations  None recommended by OT       Precautions / Restrictions Precautions Precautions: Fall Restrictions Weight Bearing Restrictions: No      Mobility Bed Mobility Overal bed mobility: Modified Independent                Transfers Overall transfer level: Needs assistance Equipment used: Rolling walker (2 wheeled) Transfers: Sit to/from Stand Sit to Stand: Min guard         General transfer comment: minguard for safety and lines    Balance Overall balance assessment: Needs assistance Sitting-balance support: No upper extremity supported;Feet supported Sitting balance-Leahy Scale: Good     Standing balance support: No upper extremity supported;During functional activity Standing balance-Leahy Scale: Fair Standing balance comment: using hand gel after toileting most of weight on R foot                           ADL either performed or assessed with clinical judgement   ADL Overall ADL's : Needs assistance/impaired Eating/Feeding: Independent;Sitting   Grooming: Supervision/safety;Set up;Standing   Upper Body Bathing: Set  up;Sitting   Lower Body Bathing: Moderate assistance Lower Body Bathing Details (indicate cue type and reason): min guard  A sit<>stand Upper Body Dressing : Set up;Sitting   Lower Body Dressing: Moderate assistance Lower Body Dressing Details (indicate cue type and reason): Min guard A sit<>stand Toilet Transfer: Min guard;Ambulation;Regular Toilet;Grab bars   Toileting- Clothing Manipulation and Hygiene: Min guard;Sit to/from stand               Vision Patient Visual Report: No change from baseline              Pertinent Vitals/Pain Pain Assessment: 0-10 Pain Score: 10-Worst pain ever Pain Location: LLE Pain Descriptors / Indicators: Grimacing;Guarding;Sore Pain Intervention(s): Monitored during session;Repositioned     Hand Dominance Right   Extremity/Trunk Assessment Upper Extremity Assessment Upper Extremity Assessment: Overall WFL for tasks assessed   Lower Extremity Assessment Lower Extremity Assessment: LLE deficits/detail LLE Deficits / Details: AAROM WFL with pain and patchy errythema   Cervical / Trunk Assessment Cervical / Trunk Assessment: Normal   Communication Communication Communication: No difficulties   Cognition Arousal/Alertness: Awake/alert Behavior During Therapy: WFL for tasks assessed/performed Overall Cognitive Status: Within Functional Limits for tasks assessed                                                Home Living Family/patient expects to be discharged to:: Private residence Living Arrangements: Children (5  year old) Available Help at Discharge: Family Type of Home: House Home Access: Stairs to enter Technical brewer of Steps: 3 Entrance Stairs-Rails: Right Home Layout: One level     Bathroom Shower/Tub: Teacher, early years/pre: Standard     Home Equipment: None          Prior Functioning/Environment Level of Independence: Independent                 OT Problem List:  Decreased strength;Decreased range of motion;Impaired balance (sitting and/or standing);Obesity;Pain      OT Treatment/Interventions: Self-care/ADL training;Patient/family education;DME and/or AE instruction;Balance training    OT Goals(Current goals can be found in the care plan section) Acute Rehab OT Goals Patient Stated Goal: To return to independent OT Goal Formulation: With patient Time For Goal Achievement: 03/04/17 Potential to Achieve Goals: Good  OT Frequency: Min 2X/week              AM-PAC PT "6 Clicks" Daily Activity     Outcome Measure Help from another person eating meals?: None Help from another person taking care of personal grooming?: A Little Help from another person toileting, which includes using toliet, bedpan, or urinal?: A Little Help from another person bathing (including washing, rinsing, drying)?: A Lot Help from another person to put on and taking off regular upper body clothing?: A Little Help from another person to put on and taking off regular lower body clothing?: A Lot 6 Click Score: 17   End of Session Equipment Utilized During Treatment: Surveyor, mining Communication:  (pt requests anti-nausea meds)  Activity Tolerance: Patient tolerated treatment well Patient left: in bed;with call bell/phone within reach  OT Visit Diagnosis: Unsteadiness on feet (R26.81);Pain Pain - Right/Left: Left Pain - part of body: Leg                Time: 1131-1200 OT Time Calculation (min): 29 min Charges:  OT General Charges $OT Visit: 1 Procedure OT Evaluation $OT Eval Moderate Complexity: 1 Procedure OT Treatments $Self Care/Home Management : 8-22 mins [place], OTR/L [phone] 02/25/2017

## 2017-02-25 NOTE — Progress Notes (Addendum)
Spoke with patient about her diabetes. Was admitted with elevated blood sugars. States that she was on Metformin at the beginning of the year. Had not refilled the prescription since March. Is on glucostabilizer. Needs a PCP. Case management has set her up with an appointment at [facility] in June, 2018. Will be transitioning off glucostabilizer with Lantus 20 units. Will continue to monitor blood sugars while in the hospital. Staff RN's to work with patient. Physician will see how patient does on insulin while in the hospital. Oral agents such as Metformin and Amaryl would be affordable.  Physician will determine what will be best for DM meds for patient at discharge. Patient will also need prescription for home blood glucose kit (includes lancets and strips) order # [id number].  Harvel Ricks RN BSN CDE Diabetes Coordinator Pager: [phone]  8am-5pm

## 2017-02-25 NOTE — Care Management Note (Addendum)
Case Management Note  Patient Details  Name: Terri Sparks MRN: [id number] Date of Birth: 12-21-1975  Subjective/Objective:   From home, pta indep, has psa, s/p left cia stenting with thromboembolectomy of lle now with signals in foot and appears viable  - NCM gave patient Match Letter to ast with medications. She also would like to do outpt physical therapy so she can try to get financial ast with paying for the outpatient therapy.  NCM will send referral thru epic for  outpt physical therapy.  NCM gave patient directions on how to get to outpatient neuro rehab  5/29 [place], BSN - dates on patient's Match letter will need to be changed to reflect date of discharge when she is medically stable for discharge.  PCP - none-  [facility] apt 6/18 at 10 am                    Action/Plan: NCM will follow for dc needs.   Expected Discharge Date:  02/27/17               Expected Discharge Plan:  Home/Self Care  In-House Referral:     Discharge planning Services  CM Consult  Post Acute Care Choice:    Choice offered to:     DME Arranged:    DME Agency:     HH Arranged:    HH Agency:     Status of Service:  Completed, signed off  If discussed at [employer] of Stay Meetings, dates discussed:    Additional Comments:  Zenon Mayo, RN 02/25/2017, 4:26 PM

## 2017-02-25 NOTE — Anesthesia Postprocedure Evaluation (Addendum)
Anesthesia Post Note  Patient: Terri Sparks  Procedure(s) Performed: Procedure(s) (LRB): Left Lower Extremity Embolectomy and Angiogram. (Left) Aortagram, Left lower extremity Run-off (Left) INSERTION OF Common ILIAC STENT  Patient location during evaluation: PACU Anesthesia Type: General Level of consciousness: awake and alert Pain management: pain level controlled Vital Signs Assessment: post-procedure vital signs reviewed and stable Respiratory status: spontaneous breathing, nonlabored ventilation, respiratory function stable and patient connected to nasal cannula oxygen Cardiovascular status: blood pressure returned to baseline and stable Postop Assessment: no signs of nausea or vomiting Anesthetic complications: yes Comments: Patient with poor dentition preop and warned of possible tooth loss given severity of tooth instability. Following intubation dentition baseline. At emergence patient pressed down on ETT and lost upper tooth. Tooth was recovered and patient notified in PACU.        Last Vitals:  Vitals:   02/25/17 0100 02/25/17 0459  BP: 128/68 97/70  Pulse: (!) 101 99  Resp: (!) 23 17  Temp: 36.4 C 36.9 C    Last Pain:  Vitals:   02/25/17 0459  TempSrc: Oral  PainSc:                  Raymel Cull

## 2017-02-25 NOTE — Consult Note (Addendum)
[facility] Consult History and Physical  SAMA ARAUZ ZYS:[id number] DOB: 11-24-1975 DOA: 02/24/2017  Requesting physician: Dr. Donzetta Matters (vascular) PCP: Patient, No Pcp Per   Issue: Hyperglycemia  Chief Complaint:   HPI: LONIE RUMMELL is a 41 y.o. female  with past history significant for venous thromboembolism, drug use, diabetes, hypercalcemia and anxiety really presented for painful cold left lower extremity. Patient is postoperative day 1 for left lower extremity thrombolectomy and iliac artery stenting. Prior to admission patient states she has had no care for her diabetes since her diagnosis. She does not have a doctor. However she was compliant with her metformin.  Patient with hunger and pain with ambulation.  Review of Systems:  As per HPI otherwise 10 point review of systems negative.    Past Medical History:  Diagnosis Date  . Anxiety   . GERD (gastroesophageal reflux disease)   . History of blood clots   . Hypercalcemia   . Hyperparathyroidism   . Nephrolithiasis   . Renal calculi    Past Surgical History:  Procedure Laterality Date  . 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 TUBAL LIGATION     Social History:  reports that she has been smoking Cigarettes.  She has a 7.50 pack-year smoking history. She has never used smokeless tobacco. She reports that she uses drugs, including Marijuana. She reports that she does not drink alcohol.  Allergies  Allergen Reactions  . Macrobid [Nitrofurantoin Macrocrystal] Hives, Shortness Of Breath and Rash  . Other Hives, Shortness Of Breath and Rash    NO "-CILLINS"!!!  . Sulfa Antibiotics Hives, Shortness Of Breath and Rash    Family History  Problem Relation Age of Onset  . Depression Mother   . Diabetes Father   . Heart failure Father      Prior to Admission medications   Medication Sig Start Date End Date Taking? Authorizing Provider  ibuprofen (ADVIL,MOTRIN) 200 MG tablet Take 400 mg by mouth every 6  (six) hours as needed (for pain or headaches).    Yes [provider]  Multiple Vitamins-Calcium (ONE-A-DAY WOMENS FORMULA) TABS Take 1 tablet by mouth daily.   Yes [provider]  cephALEXin (KEFLEX) 500 MG capsule Take 1 capsule (500 mg total) by mouth 4 (four) times daily. Patient not taking: Reported on 02/24/2017 10/10/16   Muthersbaugh, Jarrett Soho, PA-C  clotrimazole (LOTRIMIN) 1 % cream Apply to affected area 2 times daily Patient not taking: Reported on 02/24/2017 10/10/16   Muthersbaugh, Jarrett Soho, PA-C  gabapentin (NEURONTIN) 100 MG capsule Take 1 capsule (100 mg total) by mouth 2 (two) times daily. Patient not taking: Reported on 02/24/2017 10/10/16 02/24/17  Muthersbaugh, Jarrett Soho, PA-C  glucose blood test strip Use as instructed 10/10/16   Muthersbaugh, Jarrett Soho, PA-C  metFORMIN (GLUCOPHAGE) 500 MG tablet Take 1 tablet (500 mg total) by mouth 2 (two) times daily with a meal. Patient not taking: Reported on 02/24/2017 10/10/16   Muthersbaugh, Jarrett Soho, PA-C  methocarbamol (ROBAXIN-750) 750 MG tablet Take 1 tablet (750 mg total) by mouth 4 (four) times daily. Patient not taking: Reported on 02/24/2017 06/04/15   Lacretia Leigh, MD  ondansetron (ZOFRAN) 4 MG tablet Take 1 tablet (4 mg total) by mouth every 6 (six) hours. Patient not taking: Reported on 02/24/2017 08/17/15   Patel-Mills, Orvil Feil, PA-C  predniSONE (DELTASONE) 20 MG tablet Take 2 tablets (40 mg total) by mouth daily. Patient not taking: Reported on 02/24/2017 06/04/15   Lacretia Leigh, MD  promethazine (PHENERGAN) 25 MG tablet  Take 1 tablet (25 mg total) by mouth every 6 (six) hours as needed for nausea or vomiting. Patient not taking: Reported on 02/24/2017 06/04/15   Lacretia Leigh, MD   Physical Exam: Vitals:   02/25/17 0030 02/25/17 0100 02/25/17 0459 02/25/17 0724  BP: 140/74 128/68 97/70 123/71  Pulse: (!) 101 (!) 101 99 100  Resp: (!) 23 (!) 23 17 (!) 24  Temp: 97.9 F (36.6 C) 97.6 F (36.4 C) 98.4 F (36.9 C) 98.4 F (36.9  C)  TempSrc:  Oral Oral Oral  SpO2: 98% 98% 96% 99%  Weight:      Height:        Wt Readings from Last 3 Encounters:  02/24/17 125.2 kg (276 lb)  10/10/16 116.1 kg (256 lb)  08/28/16 122.5 kg (270 lb)    General:  Appears calm and comfortable; A&Ox3 Eyes:  PERRL, EOMI, normal lids, iris ENT:  grossly normal hearing, lips & tongue Neck:  no LAD, masses or thyromegaly Cardiovascular:  RRR, no m/r/g. No LE edema.  Respiratory:  CTA bilaterally, no w/r/r. Normal respiratory effort. Abdomen:  soft, ntnd Skin:  no rash or induration seen on limited exam Musculoskeletal:  grossly normal tone BUE/BLE Psychiatric:  grossly normal mood and affect, speech fluent and appropriate Neurologic:  CN 2-12 grossly intact, moves all extremities in coordinated fashion.          Labs on Admission:  Basic Metabolic Panel:  Recent Labs Lab 02/24/17 1557 02/25/17 0553  NA 130* 134*  K 3.8 3.6  CL 98* 105  CO2 18* 21*  GLUCOSE 521* 106*  BUN 11 11  CREATININE 1.47* 1.23*  CALCIUM 8.7* 8.0*   Liver Function Tests:  Recent Labs Lab 02/24/17 1557  AST 27  ALT 18  ALKPHOS 91  BILITOT 0.8  PROT 6.3*  ALBUMIN 2.9*   No results for input(s): LIPASE, AMYLASE in the last 168 hours. No results for input(s): AMMONIA in the last 168 hours. CBC:  Recent Labs Lab 02/24/17 1557 02/25/17 0553  WBC 17.8* 23.5*  NEUTROABS 14.5*  --   HGB 13.6 10.2*  HCT 40.8 31.5*  MCV 86.1 86.3  PLT 294 273   Cardiac Enzymes: No results for input(s): CKTOTAL, CKMB, CKMBINDEX, TROPONINI in the last 168 hours.  BNP (last 3 results) No results for input(s): BNP in the last 8760 hours.  ProBNP (last 3 results) No results for input(s): PROBNP in the last 8760 hours.   Serum creatinine: 1.23 mg/dL (H) 02/25/17 0553 Estimated creatinine clearance: 81.8 mL/min (A)  CBG:  Recent Labs Lab 02/25/17 0241 02/25/17 0352 02/25/17 0458 02/25/17 0612 02/25/17 0718  GLUCAP 152* 153* 114* 110* 163*     Radiological Exams on Admission: Ct Angio Aortobifemoral W And/or Wo Contrast  Result Date: 02/24/2017 CLINICAL DATA:  Left foot pain ischemia with untreated diabetes. History of DVT. EXAM: CT ANGIOGRAPHY OF ABDOMINAL AORTA WITH ILIOFEMORAL RUNOFF TECHNIQUE: Multidetector CT imaging of the abdomen, pelvis and lower extremities was performed using the standard protocol during bolus administration of intravenous contrast. Multiplanar CT image reconstructions and MIPs were obtained to evaluate the vascular anatomy. CONTRAST:  125 mL Isovue 370 COMPARISON:  Abdominal CT 08/17/2015 FINDINGS: VASCULAR Aorta: Normal caliber of the abdominal aorta without significant atherosclerotic disease and no evidence for dissection. Celiac: Patent without evidence of aneurysm, dissection, vasculitis or significant stenosis. SMA: Patent without evidence of aneurysm, dissection, vasculitis or significant stenosis. Incidentally, there is a replaced right hepatic artery which is a normal anatomic  variant. Renals: Both renal arteries are patent without evidence of aneurysm, dissection, vasculitis, fibromuscular dysplasia or significant stenosis. IMA: Proximal IMA is patent. Cannot exclude clot or occlusion in the mid segment on sequence 4, image 103. RIGHT Lower Extremity Inflow: Common, internal and external iliac arteries are patent without evidence of aneurysm, dissection, vasculitis or significant stenosis. Outflow: Mild atherosclerotic disease in the right common femoral artery without significant stenosis. The proximal deep femoral arteries are patent. Right SFA is patent without significant atherosclerotic disease. Right popliteal artery is patent without significant atherosclerotic disease. Runoff: Three-vessel runoff in the right lower extremity. The distal anterior tibial artery is small. Distal anterior tibial artery may occlude near the ankle. Posterior tibial artery is patent across the ankle. No significant flow in  the proximal dorsalis pedis artery. LEFT Lower Extremity Inflow: Irregular thrombus throughout the left common iliac artery and this is suggestive for acute/subacute thrombus rather than chronic thrombus. Proximal left internal iliac artery is occluded and this may represent acute thrombosis. Left external iliac artery is patent. Outflow: Mild atherosclerotic disease in the left common femoral artery without significant stenosis. Short-segment proximal occlusion of the left profunda femoral artery. There is reconstitution of the deep left femoral artery. The left SFA is patent. Left popliteal artery is patent. There are low-density structures adjacent to the anterior aspect of the popliteal artery. There is another low-density structure lateral to the left popliteal artery. Findings could be associated with adventitial cystic disease. There is no significant compression on the left popliteal artery. Runoff: Runoff vessels are patent proximally. The left anterior tibial artery occludes in the mid calf. There is a segmental occlusion in the distal left peroneal artery. Distal occlusion of the posterior tibial artery. These occlusions within the runoff vessels are likely secondary to embolic disease. No significant arterial flow identified in the left foot. Veins: Prominent superficial veins along the medial aspect of the right thigh. Findings are suggestive for superficial venous insufficiency. Review of the MIP images confirms the above findings. NON-VASCULAR Lower chest: Several small pulmonary nodules, largest along the right major fissure. Largest nodule measures 8 mm on sequence 4, image 9. These large nodules have not significantly changed since 08/25/2014. Indeterminate ground-glass pleural-based nodule along the right lower lobe on sequence 4, image 7. Suspect this could be related to atelectasis. Hepatobiliary: Normal appearance of the liver and gallbladder. Pancreas: Normal appearance of the pancreas without  inflammation or duct dilatation. Spleen: Normal appearance of spleen without enlargement. Adrenals/Urinary Tract: Again noted is a 1.3 cm low-density nodule in the left adrenal gland which is most compatible with an adenoma based on the previous noncontrast examination. Normal appearance of the right adrenal gland. Again noted are multiple bilateral renal stones. However, there is now gas within multiple calices bilaterally. There is extensive cortical scarring throughout the left kidney. No significant edema around either kidney. Negative for hydronephrosis. There is also gas within the urinary bladder. Stomach/Bowel: Small hiatal hernia. Question a lymph node adjacent to the hiatal hernia on sequence 4, image 33. Otherwise, normal appearance of stomach, small bowel and large bowel. Appendix is normal. Lymphatic: Small lymph nodes in the upper abdomen are nonspecific. Reproductive: Uterus and bilateral adnexa are unremarkable. Other: No free fluid.  No free intraperitoneal air. Musculoskeletal: Focal sclerosis in the left pubic bone may represent a bone island. 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 There is occlusion of the proximal left internal iliac  artery. There is a short-segment occlusion in the left profunda femoral artery. There is distal occlusion of the left runoff vessels without significant arterial flow identified in the left foot. No significant atherosclerotic disease in the right lower extremity. There is distal disease in the right anterior tibial artery and within the right dorsalis pedis artery. Embolic disease in this area cannot be excluded. Low-density or cystic structures in left popliteal region near the left popliteal artery. This may represent adventitial cystic disease but there does not appear to be significant compression or narrowing of the left popliteal artery. NON-VASCULAR There is gas within the  urinary bladder and bilateral renal calices. It is possible that this gas is iatrogenic and related to severe reflux. However, the patient's recent urinalysis demonstrates many bacteria and the patient has bilateral kidney stones and extensive scarring in the left kidney. Findings are concerning for a urinary tract infection with a gas-forming organism. Again noted are pulmonary nodules. Largest nodules have not significantly changed since 2015. Some of the pulmonary nodules are indeterminate. This could be better characterized with a dedicated chest CT. These results were called by telephone at the time of interpretation on 02/24/2017 at 6:17 pm to Dr. Fredia Sorrow , who verbally acknowledged these results. Electronically Signed   By: Markus Daft M.D.   On: 02/24/2017 18:32    EKG: no new  Assessment/Plan Active Problems:   GERD (gastroesophageal reflux disease)   Peripheral vascular disease of lower extremity (HCC)   Iliac artery occlusion, left (HCC)   Tobacco abuse   Diabetes mellitus, type II (HCC)  Iliac artery occlusion Postop day 1 Management per surgery Pain control with oral medication  Diabetes A1c ordered Stop insulin drip, BS well controlled so bar Sliding scale insulin, will try patient on medium sliding scale Lantus 20u DM coordinator consulted Consider ACEi on d/c for renal protection  UTI No urine culture and pt received abx for surgical prophylaxis Started on rocephin  Tobacco abuse Nicotine patch  Reflux Continue PPI  CKD III Cr at baseline 1.2-1.3 Avoid NSAIDS & other nephrotoxic drugs  Thank you for allowing [name] to speak in the care of this patient.  Elwin Mocha, MD Family Medicine [facility] [email or web address] Password [email or web address]

## 2017-02-25 NOTE — Progress Notes (Signed)
  Progress Note    02/25/2017 8:26 AM 1 Day Post-Op  Subjective:  Left foot feeling much better  Vitals:   02/25/17 0459 02/25/17 0724  BP: 97/70 123/71  Pulse: 99 100  Resp: 17 (!) 24  Temp: 98.4 F (36.9 C) 98.4 F (36.9 C)    Physical Exam: [id number] Non labored respirations Left leg incision cdi Strong left peroneal signal and lateral foot signal Left foot has normal color  CBC    Component Value Date/Time   WBC 23.5 (H) 02/25/2017 0553   RBC 3.65 (L) 02/25/2017 0553   HGB 10.2 (L) 02/25/2017 0553   HCT 31.5 (L) 02/25/2017 0553   PLT 273 02/25/2017 0553   MCV 86.3 02/25/2017 0553   MCH 27.7 02/25/2017 0553   MCHC 32.1 02/25/2017 0553   RDW 14.7 02/25/2017 0553   LYMPHSABS 2.6 02/24/2017 1557   MONOABS 0.6 02/24/2017 1557   EOSABS 0.1 02/24/2017 1557   BASOSABS 0.0 02/24/2017 1557    BMET    Component Value Date/Time   NA 134 (L) 02/25/2017 0553   K 3.6 02/25/2017 0553   CL 105 02/25/2017 0553   CO2 21 (L) 02/25/2017 0553   GLUCOSE 106 (H) 02/25/2017 0553   BUN 11 02/25/2017 0553   CREATININE 1.23 (H) 02/25/2017 0553   CALCIUM 8.0 (L) 02/25/2017 0553   GFRNONAA 53 (L) 02/25/2017 0553   GFRAA >60 02/25/2017 0553    INR No results found for: INR   Intake/Output Summary (Last 24 hours) at 02/25/17 0826 Last data filed at 02/25/17 0700  Gross per 24 hour  Intake          2975.83 ml  Output             1315 ml  Net          1660.83 ml     Assessment:  41 y.o. female is s/p left cia stenting with thromboembolectomy of lle now with signals in foot and appears viable  Plan: Continue anticoagulation Aspirin Echo needs CTA of chest to evaluate more proximal lesion, wait until this weekend given recent contrast loads Will ask hospitalists for help managing medical issues   Lilburn Straw C. Donzetta Matters, MD [facility] Office: [phone] Pager: [phone]  02/25/2017 8:26 AM

## 2017-02-25 NOTE — Plan of Care (Signed)
Problem: Education: Goal: Knowledge of [facility] Education information/materials will improve Outcome: Progressing oreiented to unit and it's routines

## 2017-02-25 NOTE — Progress Notes (Signed)
[place] for heparin Indication: possible arterial clot  Allergies  Allergen Reactions  . Macrobid [Nitrofurantoin Macrocrystal] Hives, Shortness Of Breath and Rash  . Other Hives, Shortness Of Breath and Rash    NO "-CILLINS"!!!  . Penicillins Shortness Of Breath    Had had cephalosporins without incident  . Sulfa Antibiotics Hives, Shortness Of Breath and Rash    Patient Measurements: Height: 5\' 7"  (170.2 cm) Weight: 276 lb (125.2 kg) IBW/kg (Calculated) : 61.6 Heparin Dosing Weight: 91.5 kg  Vital Signs: Temp: 98.4 F (36.9 C) (05/25 0724) Temp Source: Oral (05/25 0724) BP: 123/71 (05/25 0724) Pulse Rate: 100 (05/25 0724)  Labs:  Recent Labs  02/24/17 1557 02/25/17 0553 02/25/17 1025  HGB 13.6 10.2*  --   HCT 40.8 31.5*  --   PLT 294 273  --   HEPARINUNFRC  --   --  0.21*  CREATININE 1.47* 1.23*  --     Estimated Creatinine Clearance: 81.8 mL/min (A) (by C-G formula based on SCr of 1.23 mg/dL (H)).  Assessment: 41yo F with h/o of DVT not on anticoagulation PTA presents with left lower extremity pain.   Anticoag: hep for DVT post embolectomy; none pta Hep gtt 1600 units/hr with lvl 0.21  Renal: SCr 1.23  Heme/Onc: H&H 10.2/31.5, Plt 273  Goal of Therapy:  Heparin level 0.3-0.7 units/ml Monitor platelets by anticoagulation protocol: Yes   Plan:  Give 1250 units bolus x 1 Increase heparin infusion to 1750 units/hr 1800 HL Daily HL, CBC F/U plans for [facility]  Levester Fresh, PharmD, BCPS, BCCCP Clinical Pharmacist Clinical phone for 02/25/2017 from 7a-3:30p: [phone] If after 3:30p, please call main pharmacy at: [phone] 02/25/2017 11:05 AM

## 2017-02-26 LAB — URINALYSIS, ROUTINE W REFLEX MICROSCOPIC
Bilirubin Urine: NEGATIVE
Glucose, UA: >=500 mg/dL — AB
Ketones, ur: NEGATIVE mg/dL
Nitrite: NEGATIVE
Protein, ur: NEGATIVE mg/dL
Specific Gravity, Urine: 1.007 (ref 1.005–1.030)
pH: 6 (ref 5.0–8.0)

## 2017-02-26 LAB — CBC WITH DIFFERENTIAL/PLATELET
Basophils Absolute: 0 10*3/uL (ref 0.0–0.1)
Basophils Relative: 0 %
Eosinophils Absolute: 0.3 10*3/uL (ref 0.0–0.7)
Eosinophils Relative: 2 %
HCT: 28.7 % — ABNORMAL LOW (ref 36.0–46.0)
Hemoglobin: 9.2 g/dL — ABNORMAL LOW (ref 12.0–15.0)
Lymphocytes Relative: 29 %
Lymphs Abs: 4.6 10*3/uL — ABNORMAL HIGH (ref 0.7–4.0)
MCH: 28.6 pg (ref 26.0–34.0)
MCHC: 32.1 g/dL (ref 30.0–36.0)
MCV: 89.1 fL (ref 78.0–100.0)
Monocytes Absolute: 0.8 10*3/uL (ref 0.1–1.0)
Monocytes Relative: 5 %
Neutro Abs: 10 10*3/uL — ABNORMAL HIGH (ref 1.7–7.7)
Neutrophils Relative %: 64 %
Platelets: 283 10*3/uL (ref 150–400)
RBC: 3.22 MIL/uL — ABNORMAL LOW (ref 3.87–5.11)
RDW: 15.2 % (ref 11.5–15.5)
WBC: 15.8 10*3/uL — ABNORMAL HIGH (ref 4.0–10.5)

## 2017-02-26 LAB — BASIC METABOLIC PANEL
Anion gap: 9 (ref 5–15)
BUN: 9 mg/dL (ref 6–20)
CO2: 18 mmol/L — ABNORMAL LOW (ref 22–32)
Calcium: 8.3 mg/dL — ABNORMAL LOW (ref 8.9–10.3)
Chloride: 103 mmol/L (ref 101–111)
Creatinine, Ser: 1.38 mg/dL — ABNORMAL HIGH (ref 0.44–1.00)
GFR calc Af Amer: 54 mL/min — ABNORMAL LOW (ref >60)
GFR calc non Af Amer: 46 mL/min — ABNORMAL LOW (ref >60)
Glucose, Bld: 291 mg/dL — ABNORMAL HIGH (ref 65–99)
Potassium: 3.9 mmol/L (ref 3.5–5.1)
Sodium: 130 mmol/L — ABNORMAL LOW (ref 135–145)

## 2017-02-26 LAB — HEMOGLOBIN A1C
Hgb A1c MFr Bld: 14 % — ABNORMAL HIGH (ref 4.8–5.6)
Mean Plasma Glucose: 355 mg/dL

## 2017-02-26 LAB — GLUCOSE, CAPILLARY
Glucose-Capillary: 257 mg/dL — ABNORMAL HIGH (ref 65–99)
Glucose-Capillary: 179 mg/dL — ABNORMAL HIGH (ref 65–99)
Glucose-Capillary: 207 mg/dL — ABNORMAL HIGH (ref 65–99)
Glucose-Capillary: 284 mg/dL — ABNORMAL HIGH (ref 65–99)

## 2017-02-26 LAB — HEPARIN LEVEL (UNFRACTIONATED)
Heparin Unfractionated: 0.25 IU/mL — ABNORMAL LOW (ref 0.30–0.70)
Heparin Unfractionated: 0.38 IU/mL (ref 0.30–0.70)
Heparin Unfractionated: 0.4 IU/mL (ref 0.30–0.70)

## 2017-02-26 MED ORDER — HYDROXYZINE HCL 25 MG PO TABS
25.0000 mg | ORAL_TABLET | Freq: Three times a day (TID) | ORAL | Status: DC | PRN
  Administered 2017-02-26 – 2017-02-28 (×3): 25 mg via ORAL
  Filled 2017-02-26 (×2): qty 1

## 2017-02-26 MED ORDER — FLUCONAZOLE 150 MG PO TABS
150.0000 mg | ORAL_TABLET | Freq: Once | ORAL | Status: AC
  Administered 2017-02-26: 150 mg via ORAL
  Filled 2017-02-26: qty 1

## 2017-02-26 MED ORDER — PROMETHAZINE HCL 25 MG/ML IJ SOLN
12.5000 mg | Freq: Four times a day (QID) | INTRAMUSCULAR | Status: DC | PRN
  Administered 2017-02-26: 12.5 mg via INTRAVENOUS
  Filled 2017-02-26: qty 1

## 2017-02-26 MED ORDER — HYDROXYZINE HCL 25 MG PO TABS
25.0000 mg | ORAL_TABLET | Freq: Three times a day (TID) | ORAL | Status: DC
  Filled 2017-02-26: qty 1

## 2017-02-26 MED ORDER — INSULIN GLARGINE 100 UNIT/ML ~~LOC~~ SOLN
25.0000 [IU] | Freq: Every day | SUBCUTANEOUS | Status: DC
  Administered 2017-02-26: 25 [IU] via SUBCUTANEOUS
  Filled 2017-02-26 (×2): qty 0.25

## 2017-02-26 MED ORDER — CLOTRIMAZOLE 1 % VA CREA
1.0000 | TOPICAL_CREAM | Freq: Every day | VAGINAL | Status: DC
  Administered 2017-02-26 – 2017-03-02 (×5): 1 via VAGINAL
  Filled 2017-02-26 (×3): qty 45

## 2017-02-26 MED ORDER — WHITE PETROLATUM GEL
Status: AC
  Filled 2017-02-26: qty 1

## 2017-02-26 MED ORDER — HYDROXYZINE HCL 25 MG PO TABS
25.0000 mg | ORAL_TABLET | Freq: Once | ORAL | Status: AC
  Administered 2017-02-26: 25 mg via ORAL
  Filled 2017-02-26: qty 1

## 2017-02-26 MED ORDER — INSULIN ASPART 100 UNIT/ML ~~LOC~~ SOLN
3.0000 [IU] | Freq: Three times a day (TID) | SUBCUTANEOUS | Status: DC
  Administered 2017-02-26 (×3): 3 [IU] via SUBCUTANEOUS

## 2017-02-26 NOTE — Progress Notes (Signed)
[place] for heparin Indication: possible arterial clot  Allergies  Allergen Reactions  . Macrobid [Nitrofurantoin Macrocrystal] Hives, Shortness Of Breath and Rash  . Other Hives, Shortness Of Breath and Rash    NO "-CILLINS"!!!  . Penicillins Shortness Of Breath    Had had cephalosporins without incident  . Sulfa Antibiotics Hives, Shortness Of Breath and Rash    Patient Measurements: Height: 5\' 7"  (170.2 cm) Weight: 276 lb (125.2 kg) IBW/kg (Calculated) : 61.6 Heparin Dosing Weight: 91.5 kg  Vital Signs: Temp: 98.6 F (37 C) (05/26 0322) Temp Source: Oral (05/26 0322) BP: 127/70 (05/26 0322) Pulse Rate: 106 (05/26 0322)  Labs:  Recent Labs  02/24/17 1557  02/24/17 2159 02/25/17 0553 02/25/17 1025 02/25/17 1822 02/26/17 0315  HGB 13.6  < > 11.2* 10.2*  --   --  9.2*  HCT 40.8  < > 33.0* 31.5*  --   --  28.7*  PLT 294  --   --  273  --   --  283  HEPARINUNFRC  --   --   --   --  0.21* 0.28* 0.25*  CREATININE 1.47*  --   --  1.23*  --   --  1.38*  < > = values in this interval not displayed.  Estimated Creatinine Clearance: 72.9 mL/min (A) (by C-G formula based on SCr of 1.38 mg/dL (H)).  Assessment: 41yo F with h/o of DVT not on anticoagulation PTA presents with left lower extremity pain.   Anticoag: hep for DVT post embolectomy; none pta Hep gtt 1950 units/hr with level of 0.25 (goal 0.3-0.7)   Renal: SCr 1.38  Heme/Onc: H&H 9.2/28.7, Plt 283  Goal of Therapy:  Heparin level 0.3-0.7 units/ml Monitor platelets by anticoagulation protocol: Yes   Plan:  1. Increase heparin infusion to 2200 units/hr 2. Repeat heparin level at 12:00 today  3. Daily heparin level, CBC   Vincenza Hews, PharmD, BCPS 02/26/2017, 4:35 AM

## 2017-02-26 NOTE — Progress Notes (Signed)
[facility]                                                                              Patient Demographics  Terri Sparks, is a 41 y.o. female, DOB - 08/05/1976, JHE:[id number]  Admit date - 02/24/2017   Admitting Physician Waynetta Sandy, MD  Outpatient Primary MD for the patient is Patient, No Pcp Per  Outpatient specialists:   LOS - 2  days    Chief Complaint  Patient presents with  . Foot Pain  . Urticaria  . Hyperglycemia       Brief summary   Terri Sparks is a 41 y.o. female  with past history significant for venous thromboembolism, drug use, diabetes, hypercalcemia and anxiety really presented for painful cold left lower extremity. Patient is postoperative day 1 for left lower extremity thrombolectomy and iliac artery stenting. Prior to admission patient states she has had no care for her diabetes since her diagnosis. She does not have a doctor.    Assessment & Plan    Principal Problem:   Iliac artery occlusion, left (HCC) - Status post left lower extremity thrombectomy - Management per vascular surgery  Active Problems:   GERD (gastroesophageal reflux disease) -Continue PPI  Chronic kidney disease stage III - Creatinine at baseline 1.2-1.3 - Avoid NSAIDs and other nephrotoxic medications      Tobacco abuse - Consulted on tobacco cessation    Diabetes mellitus, type II ([place]): Uncontrolled with hyperglycemia  - Increased her Lantus to 25 units today, added NovoLog meal coverage 3units TID ac, continue sliding scale  insulin - Hemoglobin A1c 14.0 - Discussed in detail with the patient regarding oral hypoglycemics versus insulin. Patient does not like the side effects of metformin that she was on before. She is motivated to use insulin, does not have insurance. Will need case management assistance with insulin and regular PCP appointments. She has an appointment with [facility].    UTI (urinary tract  infection) - Currently on IV Rocephin, urine culture (will add on previous collection)   Vaginitis - Placed on Diflucan 150 mg 1 and the clotrimazole   Code Status: DVT Prophylaxis:  heparin Family Communication: Discussed in detail with the patient, all imaging results, lab results explained to the patient    Disposition Plan: Per primary service   Time Spent in minutes  25 minutes  Procedures:   1.  Aortogram with left lower extremity angiogram 2.  Stent of left common iliac artery with 8 x 47mm iCast 3.  Left lower extremity thromboembolectomy via left common femoral exposure, left popliteal artery exposure and left posterior tibial exposure  Consultants:     Antimicrobials:   IV Rocephin 5/25>>  Diflucan 5/26 one time dose   Medications  Scheduled Meds: . aspirin EC  81 mg Oral Daily  . Chlorhexidine Gluconate Cloth  6 each Topical Q0600  . clotrimazole  1 Applicatorful Vaginal QHS  . docusate sodium  100 mg Oral Daily  . insulin aspart  0-15 Units Subcutaneous TID WC  . insulin aspart  3 Units Subcutaneous TID WC  . insulin glargine  25 Units  Subcutaneous Daily  . mupirocin ointment  1 application Nasal BID  . nicotine  21 mg Transdermal Daily  . pantoprazole  40 mg Oral Daily  . white petrolatum       Continuous Infusions: . sodium chloride    . sodium chloride 125 mL/hr at 02/25/17 1720  . cefTRIAXone (ROCEPHIN)  IV Stopped (02/25/17 1731)  . heparin 2,200 Units/hr (02/26/17 0600)  . magnesium sulfate 1 - 4 g bolus IVPB     PRN Meds:.sodium chloride, acetaminophen **OR** acetaminophen, alum & mag hydroxide-simeth, bisacodyl, clonazePAM, diphenhydrAMINE, guaiFENesin-dextromethorphan, hydrALAZINE, labetalol, magnesium sulfate 1 - 4 g bolus IVPB, metoprolol tartrate, morphine injection, ondansetron, oxyCODONE-acetaminophen, phenol, potassium chloride, promethazine, senna-docusate   Antibiotics   Anti-infectives    Start     Dose/Rate Route Frequency  Ordered Stop   02/26/17 0830  fluconazole (DIFLUCAN) tablet 150 mg     150 mg Oral  Once 02/26/17 0729 02/26/17 0942   02/25/17 1600  cefTRIAXone (ROCEPHIN) 1 g in dextrose 5 % 50 mL IVPB     1 g 100 mL/hr over 30 Minutes Intravenous Every 24 hours 02/25/17 1258 03/02/17 1559   02/25/17 0200  cefUROXime (ZINACEF) 1.5 g in dextrose 5 % 50 mL IVPB     1.5 g 100 mL/hr over 30 Minutes Intravenous Every 12 hours 02/25/17 0100 02/25/17 1447   02/24/17 1915  vancomycin (VANCOCIN) IVPB 1000 mg/200 mL premix     1,000 mg 200 mL/hr over 60 Minutes Intravenous  Once 02/24/17 1914 02/24/17 2017        Subjective:   Terri Sparks was seen and examined today. Complaining of itching on the arms and Benadryl is not helping.. Patient denies dizziness, chest pain, shortness of breath, abdominal pain, N/V/D/C, new weakness, numbess, tingling. No acute events overnight.    Objective:   Vitals:   02/25/17 2243 02/26/17 0322 02/26/17 0849 02/26/17 1153  BP: 130/70 127/70 92/69   Pulse: (!) 116 (!) 106 (!) 102 (!) 118  Resp: (!) 25 20 18 17   Temp: 98.4 F (36.9 C) 98.6 F (37 C) 98.3 F (36.8 C) 97.8 F (36.6 C)  TempSrc: Oral Oral Oral Axillary  SpO2: 100% 100% 100% 98%  Weight:      Height:        Intake/Output Summary (Last 24 hours) at 02/26/17 1210 Last data filed at 02/26/17 0900  Gross per 24 hour  Intake             2598 ml  Output             3700 ml  Net            -1102 ml     Wt Readings from Last 3 Encounters:  02/24/17 125.2 kg (276 lb)  10/10/16 116.1 kg (256 lb)  08/28/16 122.5 kg (270 lb)     Exam  General: Alert and oriented x 3, NAD  HEENT:  PERRLA, EOMI, Anicteric Sclera, mucous membranes moist.   Neck: Supple, no JVD, no masses  Cardiovascular: S1 S2 auscultated, no rubs, murmurs or gallops. Regular rate and rhythm.  Respiratory: Clear to auscultation bilaterally, no wheezing, rales or rhonchi  Gastrointestinal: Soft, nontender, nondistended, + bowel  sounds  Ext: no cyanosis clubbing or edema  Neuro: AAOx3, Cr N's II- XII. Strength 5/5 upper and lower extremities bilaterally  Skin: No rashes  Psych: Normal affect and demeanor, alert and oriented x3    Data Reviewed:  I have personally reviewed following labs and  imaging studies  Micro Results Recent Results (from the past 240 hour(s))  MRSA PCR Screening     Status: Abnormal   Collection Time: 02/25/17  5:13 AM  Result Value Ref Range Status   MRSA by PCR POSITIVE (A) NEGATIVE Final    Comment:        The GeneXpert MRSA Assay (FDA approved for NASAL specimens only), is one component of a comprehensive MRSA colonization surveillance program. It is not intended to diagnose MRSA infection nor to guide or monitor treatment for MRSA infections. Results Called to: RN Marylou Mccoy [id number] [place]     Radiology Reports Ct Angio Aortobifemoral W And/or Wo Contrast  Result Date: 02/24/2017 CLINICAL DATA:  Left foot pain ischemia with untreated diabetes. History of DVT. EXAM: CT ANGIOGRAPHY OF ABDOMINAL AORTA WITH ILIOFEMORAL RUNOFF TECHNIQUE: Multidetector CT imaging of the abdomen, pelvis and lower extremities was performed using the standard protocol during bolus administration of intravenous contrast. Multiplanar CT image reconstructions and MIPs were obtained to evaluate the vascular anatomy. CONTRAST:  125 mL Isovue 370 COMPARISON:  Abdominal CT 08/17/2015 FINDINGS: VASCULAR Aorta: Normal caliber of the abdominal aorta without significant atherosclerotic disease and no evidence for dissection. Celiac: Patent without evidence of aneurysm, dissection, vasculitis or significant stenosis. SMA: Patent without evidence of aneurysm, dissection, vasculitis or significant stenosis. Incidentally, there is a replaced right hepatic artery which is a normal anatomic variant. Renals: Both renal arteries are patent without evidence of aneurysm, dissection, vasculitis, fibromuscular dysplasia or  significant stenosis. IMA: Proximal IMA is patent. Cannot exclude clot or occlusion in the mid segment on sequence 4, image 103. RIGHT Lower Extremity Inflow: Common, internal and external iliac arteries are patent without evidence of aneurysm, dissection, vasculitis or significant stenosis. Outflow: Mild atherosclerotic disease in the right common femoral artery without significant stenosis. The proximal deep femoral arteries are patent. Right SFA is patent without significant atherosclerotic disease. Right popliteal artery is patent without significant atherosclerotic disease. Runoff: Three-vessel runoff in the right lower extremity. The distal anterior tibial artery is small. Distal anterior tibial artery may occlude near the ankle. Posterior tibial artery is patent across the ankle. No significant flow in the proximal dorsalis pedis artery. LEFT Lower Extremity Inflow: Irregular thrombus throughout the left common iliac artery and this is suggestive for acute/subacute thrombus rather than chronic thrombus. Proximal left internal iliac artery is occluded and this may represent acute thrombosis. Left external iliac artery is patent. Outflow: Mild atherosclerotic disease in the left common femoral artery without significant stenosis. Short-segment proximal occlusion of the left profunda femoral artery. There is reconstitution of the deep left femoral artery. The left SFA is patent. Left popliteal artery is patent. There are low-density structures adjacent to the anterior aspect of the popliteal artery. There is another low-density structure lateral to the left popliteal artery. Findings could be associated with adventitial cystic disease. There is no significant compression on the left popliteal artery. Runoff: Runoff vessels are patent proximally. The left anterior tibial artery occludes in the mid calf. There is a segmental occlusion in the distal left peroneal artery. Distal occlusion of the posterior tibial  artery. These occlusions within the runoff vessels are likely secondary to embolic disease. No significant arterial flow identified in the left foot. Veins: Prominent superficial veins along the medial aspect of the right thigh. Findings are suggestive for superficial venous insufficiency. Review of the MIP images confirms the above findings. NON-VASCULAR Lower chest: Several small pulmonary nodules, largest along the right major fissure.  Largest nodule measures 8 mm on sequence 4, image 9. These large nodules have not significantly changed since 08/25/2014. Indeterminate ground-glass pleural-based nodule along the right lower lobe on sequence 4, image 7. Suspect this could be related to atelectasis. Hepatobiliary: Normal appearance of the liver and gallbladder. Pancreas: Normal appearance of the pancreas without inflammation or duct dilatation. Spleen: Normal appearance of spleen without enlargement. Adrenals/Urinary Tract: Again noted is a 1.3 cm low-density nodule in the left adrenal gland which is most compatible with an adenoma based on the previous noncontrast examination. Normal appearance of the right adrenal gland. Again noted are multiple bilateral renal stones. However, there is now gas within multiple calices bilaterally. There is extensive cortical scarring throughout the left kidney. No significant edema around either kidney. Negative for hydronephrosis. There is also gas within the urinary bladder. Stomach/Bowel: Small hiatal hernia. Question a lymph node adjacent to the hiatal hernia on sequence 4, image 33. Otherwise, normal appearance of stomach, small bowel and large bowel. Appendix is normal. Lymphatic: Small lymph nodes in the upper abdomen are nonspecific. Reproductive: Uterus and bilateral adnexa are unremarkable. Other: No free fluid.  No free intraperitoneal air. Musculoskeletal: Focal sclerosis in the left pubic bone may represent a bone island. IMPRESSION: VASCULAR Thrombus in the left  common iliac artery with evidence for embolic disease in the left lower extremity. There is occlusion of the proximal left internal iliac artery. There is a short-segment occlusion in the left profunda femoral artery. There is distal occlusion of the left runoff vessels without significant arterial flow identified in the left foot. No significant atherosclerotic disease in the right lower extremity. There is distal disease in the right anterior tibial artery and within the right dorsalis pedis artery. Embolic disease in this area cannot be excluded. Low-density or cystic structures in left popliteal region near the left popliteal artery. This may represent adventitial cystic disease but there does not appear to be significant compression or narrowing of the left popliteal artery. NON-VASCULAR There is gas within the urinary bladder and bilateral renal calices. It is possible that this gas is iatrogenic and related to severe reflux. However, the patient's recent urinalysis demonstrates many bacteria and the patient has bilateral kidney stones and extensive scarring in the left kidney. Findings are concerning for a urinary tract infection with a gas-forming organism. Again noted are pulmonary nodules. Largest nodules have not significantly changed since 2015. Some of the pulmonary nodules are indeterminate. This could be better characterized with a dedicated chest CT. These results were called by telephone at the time of interpretation on 02/24/2017 at 6:17 pm to Dr. Fredia Sorrow , who verbally acknowledged these results. Electronically Signed   By: Markus Daft M.D.   On: 02/24/2017 18:32    Lab Data:  CBC:  Recent Labs Lab 02/24/17 1557  02/24/17 1951 02/24/17 2046 02/24/17 2159 02/25/17 0553 02/26/17 0315  WBC 17.8*  --   --   --   --  23.5* 15.8*  NEUTROABS 14.5*  --   --   --   --   --  10.0*  HGB 13.6  < > 12.6 11.9* 11.2* 10.2* 9.2*  HCT 40.8  < > 37.0 35.0* 33.0* 31.5* 28.7*  MCV 86.1  --    --   --   --  86.3 89.1  PLT 294  --   --   --   --  273 283  < > = values in this interval not displayed. Basic Metabolic Panel:  Recent Labs Lab 02/24/17 1557  02/24/17 1951 02/24/17 2046 02/24/17 2159 02/25/17 0553 02/26/17 0315  NA 130*  < > 133* 134* 135 134* 130*  K 3.8  < > 4.3 4.3 4.5 3.6 3.9  CL 98*  --   --   --   --  105 103  CO2 18*  --   --   --   --  21* 18*  GLUCOSE 521*  < > 386* 300* 236* 106* 291*  BUN 11  --   --   --   --  11 9  CREATININE 1.47*  --   --   --   --  1.23* 1.38*  CALCIUM 8.7*  --   --   --   --  8.0* 8.3*  < > = values in this interval not displayed. GFR: Estimated Creatinine Clearance: 72.9 mL/min (A) (by C-G formula based on SCr of 1.38 mg/dL (H)). Liver Function Tests:  Recent Labs Lab 02/24/17 1557  AST 27  ALT 18  ALKPHOS 91  BILITOT 0.8  PROT 6.3*  ALBUMIN 2.9*   No results for input(s): LIPASE, AMYLASE in the last 168 hours. No results for input(s): AMMONIA in the last 168 hours. Coagulation Profile: No results for input(s): INR, PROTIME in the last 168 hours. Cardiac Enzymes: No results for input(s): CKTOTAL, CKMB, CKMBINDEX, TROPONINI in the last 168 hours. BNP (last 3 results) No results for input(s): PROBNP in the last 8760 hours. HbA1C:  Recent Labs  02/25/17 0821  HGBA1C 14.0*   CBG:  Recent Labs Lab 02/25/17 1142 02/25/17 1225 02/25/17 1712 02/25/17 2102 02/26/17 0847  GLUCAP 237* 215* 235* 292* 257*   Lipid Profile: No results for input(s): CHOL, HDL, LDLCALC, TRIG, CHOLHDL, LDLDIRECT in the last 72 hours. Thyroid Function Tests: No results for input(s): TSH, T4TOTAL, FREET4, T3FREE, THYROIDAB in the last 72 hours. Anemia Panel: No results for input(s): VITAMINB12, FOLATE, FERRITIN, TIBC, IRON, RETICCTPCT in the last 72 hours. Urine analysis:    Component Value Date/Time   COLORURINE YELLOW 02/24/2017 1745   APPEARANCEUR CLOUDY (A) 02/24/2017 1745   LABSPEC 1.032 (H) 02/24/2017 1745   PHURINE  6.0 02/24/2017 1745   GLUCOSEU >=500 (A) 02/24/2017 1745   HGBUR SMALL (A) 02/24/2017 1745   BILIRUBINUR NEGATIVE 02/24/2017 1745   KETONESUR 20 (A) 02/24/2017 1745   PROTEINUR NEGATIVE 02/24/2017 1745   UROBILINOGEN 0.2 08/16/2015 2305   NITRITE POSITIVE (A) 02/24/2017 1745   LEUKOCYTESUR LARGE (A) 02/24/2017 1745     Sweta Halseth M.D. [facility] 02/26/2017, 12:10 PM  Pager: [phone] Between 7am to 7pm - call Pager - [phone]  After 7pm go to [email or web address] - password [email or web address]  Call night coverage person covering after 7pm

## 2017-02-26 NOTE — Progress Notes (Signed)
[facility] for heparin Indication: Iliac artery occlusion  Allergies  Allergen Reactions  . Macrobid [Nitrofurantoin Macrocrystal] Hives, Shortness Of Breath and Rash  . Other Hives, Shortness Of Breath and Rash    NO "-CILLINS"!!!  . Penicillins Shortness Of Breath    Had had cephalosporins without incident  . Sulfa Antibiotics Hives, Shortness Of Breath and Rash    Patient Measurements: Height: 5\' 7"  (170.2 cm) Weight: 276 lb (125.2 kg) IBW/kg (Calculated) : 61.6 Heparin Dosing Weight: 91.5 kg  Vital Signs: Temp: 98.4 F (36.9 C) (05/26 1915) Temp Source: Oral (05/26 1915) BP: 103/70 (05/26 1915) Pulse Rate: 105 (05/26 1915)  Labs:  Recent Labs  02/24/17 1557  02/24/17 2159 02/25/17 0553  02/26/17 0315 02/26/17 1228 02/26/17 1944  HGB 13.6  < > 11.2* 10.2*  --  9.2*  --   --   HCT 40.8  < > 33.0* 31.5*  --  28.7*  --   --   PLT 294  --   --  273  --  283  --   --   HEPARINUNFRC  --   --   --   --   < > 0.25* 0.38 0.40  CREATININE 1.47*  --   --  1.23*  --  1.38*  --   --   < > = values in this interval not displayed.  Estimated Creatinine Clearance: 72.9 mL/min (A) (by C-G formula based on SCr of 1.38 mg/dL (H)).  Assessment: 41yo F with h/o of DVT not on anticoagulation PTA presented with left lower extremity pain, started on IV heparin. 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   Evening heparin level remains therapeutic on 2200 units/hr.    Goal of Therapy:  Heparin level 0.3-0.7 units/ml Monitor platelets by anticoagulation protocol: Yes    Plan:  Continue heparin at 2200units/hr Daily heparin level, CBC F/u plans for PO anticoagulation    Hughes Better, PharmD, BCPS Clinical Pharmacist 02/26/2017 8:25 PM

## 2017-02-26 NOTE — Progress Notes (Addendum)
Progress Note    02/26/2017 7:56 AM 2 Days Post-Op  Subjective:  C/o pain bc feeling is coming back in here leg.  She is nauseated and would like some phenergan IV. Has done well walking to the bathroom with a walker.    Tm 99.1 HR 90's-110's 016'W-109'N systolic 23% RA   Vitals:   02/25/17 2243 02/26/17 0322  BP: 130/70 127/70  Pulse: (!) 116 (!) 106  Resp: (!) 25 20  Temp: 98.4 F (36.9 C) 98.6 F (37 C)    Physical Exam: Cardiac:  regular Lungs:  Non labored Incisions:  Left groin clean and dry with dry gauze in place; ankle incision and below knee incision is clean and dry Extremities:  Brisk doppler signal left lateral foot and peroneal signals; left foot is warm; sensation and motor are in tact.    CBC    Component Value Date/Time   WBC 15.8 (H) 02/26/2017 0315   RBC 3.22 (L) 02/26/2017 0315   HGB 9.2 (L) 02/26/2017 0315   HCT 28.7 (L) 02/26/2017 0315   PLT 283 02/26/2017 0315   MCV 89.1 02/26/2017 0315   MCH 28.6 02/26/2017 0315   MCHC 32.1 02/26/2017 0315   RDW 15.2 02/26/2017 0315   LYMPHSABS 4.6 (H) 02/26/2017 0315   MONOABS 0.8 02/26/2017 0315   EOSABS 0.3 02/26/2017 0315   BASOSABS 0.0 02/26/2017 0315    BMET    Component Value Date/Time   NA 130 (L) 02/26/2017 0315   K 3.9 02/26/2017 0315   CL 103 02/26/2017 0315   CO2 18 (L) 02/26/2017 0315   GLUCOSE 291 (H) 02/26/2017 0315   BUN 9 02/26/2017 0315   CREATININE 1.38 (H) 02/26/2017 0315   CALCIUM 8.3 (L) 02/26/2017 0315   GFRNONAA 46 (L) 02/26/2017 0315   GFRAA 54 (L) 02/26/2017 0315    INR No results found for: INR   Intake/Output Summary (Last 24 hours) at 02/26/17 0756 Last data filed at 02/26/17 0600  Gross per 24 hour  Intake           3514.4 ml  Output             4150 ml  Net           -635.6 ml     Assessment:  41 y.o. female is s/p:  1.  Aortogram with left lower extremity angiogram 2.  Stent of left common iliac artery with 8 x 74mm iCast 3.  Left lower extremity  thromboembolectomy via left common femoral exposure, left popliteal artery exposure and left posterior tibial exposure  2 Days Post-Op   Plan: -pt with doppler signals present in left foot.  Her sensation and motor are in tact and has continued to improve -DVT prophylaxis:  Heparin gtt -she will need a CTA of chest to evaluate more proximal lesion.  Would hold off on starting Metformin until after this is done given she will receive contrast.  Her creatinine today is 1.37, which is up from yesterday, but may be baseline for her.  Will check labs tomorrow. -appreciate DM coordinator's assistance.  Pt's A1c is 14.  Appreciate TRH's assistance  -will add IV phenergan for nausea.  -labs in the am. -leukocytosis improving    Leontine Locket, PA-C [facility] [phone] 02/26/2017 7:56 AM  I have examined the patient, reviewed and agree with above. Creatinine slightly up from yesterday. Will need CT and gram of her chest at some point. Continue to mobilize. Curt Jews, MD  02/26/2017 11:05 AM

## 2017-02-26 NOTE — Progress Notes (Signed)
[facility] for heparin Indication: Iliac artery occlusion  Allergies  Allergen Reactions  . Macrobid [Nitrofurantoin Macrocrystal] Hives, Shortness Of Breath and Rash  . Other Hives, Shortness Of Breath and Rash    NO "-CILLINS"!!!  . Penicillins Shortness Of Breath    Had had cephalosporins without incident  . Sulfa Antibiotics Hives, Shortness Of Breath and Rash    Patient Measurements: Height: 5\' 7"  (170.2 cm) Weight: 276 lb (125.2 kg) IBW/kg (Calculated) : 61.6 Heparin Dosing Weight: 91.5 kg  Vital Signs: Temp: 97.8 F (36.6 C) (05/26 1153) Temp Source: Axillary (05/26 1153) BP: 113/60 (05/26 1153) Pulse Rate: 118 (05/26 1153)  Labs:  Recent Labs  02/24/17 1557  02/24/17 2159 02/25/17 0553  02/25/17 1822 02/26/17 0315 02/26/17 1228  HGB 13.6  < > 11.2* 10.2*  --   --  9.2*  --   HCT 40.8  < > 33.0* 31.5*  --   --  28.7*  --   PLT 294  --   --  273  --   --  283  --   HEPARINUNFRC  --   --   --   --   < > 0.28* 0.25* 0.38  CREATININE 1.47*  --   --  1.23*  --   --  1.38*  --   < > = values in this interval not displayed.  Estimated Creatinine Clearance: 72.9 mL/min (A) (by C-G formula based on SCr of 1.38 mg/dL (H)).  Assessment: 41yo F with h/o of DVT not on anticoagulation PTA presented with left lower extremity pain, started on IV heparin. 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 Heparin level now therapeutic at 0.38 on 2200 units/hr.   Goal of Therapy:  Heparin level 0.3-0.7 units/ml Monitor platelets by anticoagulation protocol: Yes   Plan:  Continue heparin at 2200units/hr Confirmatory level in 6 hours Daily heparin level, CBC F/u plans for PO anticoagulation   Gwenlyn Perking, PharmD PGY1 Pharmacy Resident [phone] 02/26/2017 1:45 PM

## 2017-02-26 NOTE — Progress Notes (Signed)
Occupational Therapy Treatment Patient Details Name: Terri Sparks MRN: [id number] DOB: 1976-07-03 Today's Date: 02/26/2017    History of present illness This is a 41 y.o. female with history of diabetes and drug abuse presents with history of painful, cold, pale left foot and ankle no w/p Stent of left common iliac artery and Left lower extremity thromboembolectomy via left common femoral exposure, left popliteal artery exposure and left posterior tibial exposure   OT comments  This 40 yo female admitted and underwent above presents to acute OT with all OT education completed and AE issued, no further OT needs, we will sign off.  Follow Up Recommendations  No OT follow up;Supervision/Assistance - 24 hour    Equipment Recommendations  None recommended by OT       Precautions / Restrictions Precautions Precautions: Fall Restrictions Weight Bearing Restrictions: No       Mobility Bed Mobility Overal bed mobility: Modified Independent                    ADL either performed or assessed with clinical judgement   ADL Overall ADL's : Needs assistance/impaired                                       General ADL Comments: Educated pt on use of AE (reacher, long sponge, long shoe horn, sock aid and elastic shoe laces and issued pt these items. She returned demonstrated the use of reacher to doff socks and simulated donning underwear/pants with a pillow case. She also returned demonstrated donning socks with sock aid.      Vision Patient Visual Report: No change from baseline            Cognition Arousal/Alertness: Awake/alert Behavior During Therapy: WFL for tasks assessed/performed Overall Cognitive Status: Within Functional Limits for tasks assessed                                                     Pertinent Vitals/ Pain       Pain Assessment: 0-10 Pain Score: 4  Pain Location: LLE Pain Descriptors / Indicators: Sore Pain  Intervention(s): Limited activity within patient's tolerance;Monitored during session;Repositioned         Frequency  Min 2X/week        Progress Toward Goals  OT Goals(current goals can now be found in the care plan section)  Progress towards OT goals: Goals met/education completed, patient discharged from [facility] Discharge plan remains appropriate       AM-PAC PT "6 Clicks" Daily Activity     Outcome Measure   Help from another person eating meals?: None Help from another person taking care of personal grooming?: A Little Help from another person toileting, which includes using toliet, bedpan, or urinal?: A Little Help from another person bathing (including washing, rinsing, drying)?: A Little Help from another person to put on and taking off regular upper body clothing?: A Little Help from another person to put on and taking off regular lower body clothing?: A Little 6 Click Score: 19    End of Session    OT Visit Diagnosis: Unsteadiness on feet (R26.81);Pain Pain - Right/Left: Left Pain - part of body: Leg   Activity Tolerance  Patient tolerated treatment well   Patient Left in chair;with call bell/phone within reach   Nurse Communication          Time: 1213-1232 OT Time Calculation (min): 19 min  Charges: OT General Charges $OT Visit: 1 Procedure OT Treatments $Self Care/Home Management : 8-22 mins [place], OTR/L [phone] 02/26/2017

## 2017-02-26 NOTE — Progress Notes (Signed)
Physical Therapy Treatment Patient Details Name: Terri Sparks MRN: [id number] DOB: 1976-04-04 Today's Date: 02/26/2017    History of Present Illness This is a 41 y.o. female with history of diabetes and drug abuse presents with history of painful, cold, pale left foot and ankle no w/p Stent of left common iliac artery and Left lower extremity thromboembolectomy via left common femoral exposure, left popliteal artery exposure and left posterior tibial exposure    PT Comments    Pt admitted with above diagnosis. Pt currently with functional limitations due to balance and endurance deficits. Pt was able to ambulate in hallway with min guard assist with RW.  PRogressing.   Pt will benefit from skilled PT to increase their independence and safety with mobility to allow discharge to the venue listed below.     Follow Up Recommendations  Home health PT     Equipment Recommendations  Rolling walker with 5" wheels    Recommendations for Other Services       Precautions / Restrictions Precautions Precautions: Fall Restrictions Weight Bearing Restrictions: No    Mobility  Bed Mobility Overal bed mobility: Modified Independent                Transfers Overall transfer level: Needs assistance Equipment used: Rolling walker (2 wheeled) Transfers: Sit to/from Stand Sit to Stand: Min guard         General transfer comment: minguard for safety and lines  Ambulation/Gait Ambulation/Gait assistance: Min guard Ambulation Distance (Feet): 110 Feet Assistive device: Rolling walker (2 wheeled) Gait Pattern/deviations: Step-to pattern;Decreased stride length;Antalgic   Gait velocity interpretation: Below normal speed for age/gender General Gait Details: limited by pain L foot, but without much assist, mainly due to lines, etc.  Walked to bathroom first and then down hallway.  Pt did not want to walk to hallway but did so with encouragement.    Stairs             Wheelchair Mobility    Modified Rankin (Stroke Patients Only)       Balance Overall balance assessment: Needs assistance Sitting-balance support: No upper extremity supported;Feet supported Sitting balance-Leahy Scale: Good     Standing balance support: No upper extremity supported;During functional activity Standing balance-Leahy Scale: Fair Standing balance comment: using hand gel after toileting most of weight on R foot                            Cognition Arousal/Alertness: Awake/alert Behavior During Therapy: WFL for tasks assessed/performed Overall Cognitive Status: Within Functional Limits for tasks assessed                                        Exercises      General Comments        Pertinent Vitals/Pain Pain Assessment: 0-10 Pain Score: 10-Worst pain ever Pain Location: LLE Pain Descriptors / Indicators: Grimacing;Guarding;Sore Pain Intervention(s): Limited activity within patient's tolerance;Monitored during session;Premedicated before session;Repositioned    Home Living                      Prior Function            PT Goals (current goals can now be found in the care plan section) Progress towards PT goals: Progressing toward goals    Frequency    Min 3X/week  PT Plan Current plan remains appropriate    Co-evaluation              AM-PAC PT "6 Clicks" Daily Activity  Outcome Measure  Difficulty turning over in bed (including adjusting bedclothes, sheets and blankets)?: None Difficulty moving from lying on back to sitting on the side of the bed? : None Difficulty sitting down on and standing up from a chair with arms (e.g., wheelchair, bedside commode, etc,.)?: A Little Help needed moving to and from a bed to chair (including a wheelchair)?: A Little Help needed walking in hospital room?: A Little Help needed climbing 3-5 steps with a railing? : A Lot 6 Click Score: 19    End of  Session Equipment Utilized During Treatment: Gait belt Activity Tolerance: Patient limited by pain;Patient limited by fatigue Patient left: in bed;with call bell/phone within reach Nurse Communication: Mobility status;Patient requests pain meds PT Visit Diagnosis: Difficulty in walking, not elsewhere classified (R26.2);Pain Pain - Right/Left: Left Pain - part of body: Ankle and joints of foot     Time: 1607-3710 PT Time Calculation (min) (ACUTE ONLY): 27 min  Charges:  $Gait Training: 8-22 mins $Therapeutic Activity: 8-22 mins                    G Codes:       Ivori Storr,PT Acute Rehabilitation [phone] (pager)    Denice Paradise 02/26/2017, 12:50 PM

## 2017-02-27 ENCOUNTER — Inpatient Hospital Stay (HOSPITAL_COMMUNITY): Payer: Self-pay

## 2017-02-27 DIAGNOSIS — I745 Embolism and thrombosis of iliac artery: Secondary | ICD-10-CM

## 2017-02-27 LAB — HEPARIN LEVEL (UNFRACTIONATED)
Heparin Unfractionated: 0.21 IU/mL — ABNORMAL LOW (ref 0.30–0.70)
Heparin Unfractionated: 0.44 IU/mL (ref 0.30–0.70)
Heparin Unfractionated: 0.53 IU/mL (ref 0.30–0.70)

## 2017-02-27 LAB — CBC
HCT: 26.7 % — ABNORMAL LOW (ref 36.0–46.0)
Hemoglobin: 8.3 g/dL — ABNORMAL LOW (ref 12.0–15.0)
MCH: 27.4 pg (ref 26.0–34.0)
MCHC: 31.1 g/dL (ref 30.0–36.0)
MCV: 88.1 fL (ref 78.0–100.0)
Platelets: 298 10*3/uL (ref 150–400)
RBC: 3.03 MIL/uL — ABNORMAL LOW (ref 3.87–5.11)
RDW: 15.2 % (ref 11.5–15.5)
WBC: 12.8 10*3/uL — ABNORMAL HIGH (ref 4.0–10.5)

## 2017-02-27 LAB — ECHOCARDIOGRAM COMPLETE
Height: 67 in
Weight: 4416 oz

## 2017-02-27 LAB — GLUCOSE, CAPILLARY
Glucose-Capillary: 248 mg/dL — ABNORMAL HIGH (ref 65–99)
Glucose-Capillary: 208 mg/dL — ABNORMAL HIGH (ref 65–99)
Glucose-Capillary: 230 mg/dL — ABNORMAL HIGH (ref 65–99)
Glucose-Capillary: 155 mg/dL — ABNORMAL HIGH (ref 65–99)

## 2017-02-27 LAB — BASIC METABOLIC PANEL
Anion gap: 6 (ref 5–15)
BUN: 10 mg/dL (ref 6–20)
CO2: 23 mmol/L (ref 22–32)
Calcium: 8.5 mg/dL — ABNORMAL LOW (ref 8.9–10.3)
Chloride: 102 mmol/L (ref 101–111)
Creatinine, Ser: 1.27 mg/dL — ABNORMAL HIGH (ref 0.44–1.00)
GFR calc Af Amer: 59 mL/min — ABNORMAL LOW (ref >60)
GFR calc non Af Amer: 51 mL/min — ABNORMAL LOW (ref >60)
Glucose, Bld: 295 mg/dL — ABNORMAL HIGH (ref 65–99)
Potassium: 4 mmol/L (ref 3.5–5.1)
Sodium: 131 mmol/L — ABNORMAL LOW (ref 135–145)

## 2017-02-27 MED ORDER — INSULIN GLARGINE 100 UNIT/ML ~~LOC~~ SOLN
30.0000 [IU] | Freq: Every day | SUBCUTANEOUS | Status: DC
  Administered 2017-02-27: 30 [IU] via SUBCUTANEOUS
  Filled 2017-02-27 (×2): qty 0.3

## 2017-02-27 MED ORDER — INSULIN ASPART 100 UNIT/ML ~~LOC~~ SOLN
5.0000 [IU] | Freq: Three times a day (TID) | SUBCUTANEOUS | Status: DC
  Administered 2017-02-27 (×3): 5 [IU] via SUBCUTANEOUS

## 2017-02-27 MED ORDER — PROMETHAZINE HCL 25 MG PO TABS
25.0000 mg | ORAL_TABLET | Freq: Four times a day (QID) | ORAL | Status: DC | PRN
Start: 2017-02-27 — End: 2017-03-03
  Administered 2017-02-27 – 2017-03-03 (×6): 25 mg via ORAL
  Filled 2017-02-27 (×6): qty 1

## 2017-02-27 NOTE — Progress Notes (Signed)
Subjective: Interval History: none.. Reports continued pain throughout her left leg.  Objective: Vital signs in last 24 hours: Temp:  [97.8 F (36.6 C)-99 F (37.2 C)] 98.7 F (37.1 C) (05/27 0930) Pulse Rate:  [94-118] 94 (05/27 0930) Resp:  [14-21] 20 (05/27 0930) BP: (97-117)/(58-80) 117/60 (05/27 0930) SpO2:  [94 %-100 %] 94 % (05/27 0930)  Intake/Output from previous day: 05/26 0701 - 05/27 0700 In: 4793.1 [P.O.:1560; I.V.:3183.1; IV Piggyback:50] Out: 0160 [Urine:3325] Intake/Output this shift: No intake/output data recorded.  Left groin, popliteal and posterior tibial incisions healing. Audible flow at the posterior tibial, anterior tibial and peroneal level at her ankle. Foot warm.  Lab Results:  Recent Labs  02/26/17 0315 02/27/17 0306  WBC 15.8* 12.8*  HGB 9.2* 8.3*  HCT 28.7* 26.7*  PLT 283 298   BMET  Recent Labs  02/26/17 0315 02/27/17 0306  NA 130* 131*  K 3.9 4.0  CL 103 102  CO2 18* 23  GLUCOSE 291* 295*  BUN 9 10  CREATININE 1.38* 1.27*  CALCIUM 8.3* 8.5*    Studies/Results: Ct Angio Aortobifemoral W And/or Wo Contrast  Result Date: 02/24/2017 CLINICAL DATA:  Left foot pain ischemia with untreated diabetes. History of DVT. EXAM: CT ANGIOGRAPHY OF ABDOMINAL AORTA WITH ILIOFEMORAL RUNOFF TECHNIQUE: Multidetector CT imaging of the abdomen, pelvis and lower extremities was performed using the standard protocol during bolus administration of intravenous contrast. Multiplanar CT image reconstructions and MIPs were obtained to evaluate the vascular anatomy. CONTRAST:  125 mL Isovue 370 COMPARISON:  Abdominal CT 08/17/2015 FINDINGS: VASCULAR Aorta: Normal caliber of the abdominal aorta without significant atherosclerotic disease and no evidence for dissection. Celiac: Patent without evidence of aneurysm, dissection, vasculitis or significant stenosis. SMA: Patent without evidence of aneurysm, dissection, vasculitis or significant stenosis. Incidentally,  there is a replaced right hepatic artery which is a normal anatomic variant. Renals: Both renal arteries are patent without evidence of aneurysm, dissection, vasculitis, fibromuscular dysplasia or significant stenosis. IMA: Proximal IMA is patent. Cannot exclude clot or occlusion in the mid segment on sequence 4, image 103. RIGHT Lower Extremity Inflow: Common, internal and external iliac arteries are patent without evidence of aneurysm, dissection, vasculitis or significant stenosis. Outflow: Mild atherosclerotic disease in the right common femoral artery without significant stenosis. The proximal deep femoral arteries are patent. Right SFA is patent without significant atherosclerotic disease. Right popliteal artery is patent without significant atherosclerotic disease. Runoff: Three-vessel runoff in the right lower extremity. The distal anterior tibial artery is small. Distal anterior tibial artery may occlude near the ankle. Posterior tibial artery is patent across the ankle. No significant flow in the proximal dorsalis pedis artery. LEFT Lower Extremity Inflow: Irregular thrombus throughout the left common iliac artery and this is suggestive for acute/subacute thrombus rather than chronic thrombus. Proximal left internal iliac artery is occluded and this may represent acute thrombosis. Left external iliac artery is patent. Outflow: Mild atherosclerotic disease in the left common femoral artery without significant stenosis. Short-segment proximal occlusion of the left profunda femoral artery. There is reconstitution of the deep left femoral artery. The left SFA is patent. Left popliteal artery is patent. There are low-density structures adjacent to the anterior aspect of the popliteal artery. There is another low-density structure lateral to the left popliteal artery. Findings could be associated with adventitial cystic disease. There is no significant compression on the left popliteal artery. Runoff: Runoff  vessels are patent proximally. The left anterior tibial artery occludes in the mid calf. There is  a segmental occlusion in the distal left peroneal artery. Distal occlusion of the posterior tibial artery. These occlusions within the runoff vessels are likely secondary to embolic disease. No significant arterial flow identified in the left foot. Veins: Prominent superficial veins along the medial aspect of the right thigh. Findings are suggestive for superficial venous insufficiency. Review of the MIP images confirms the above findings. NON-VASCULAR Lower chest: Several small pulmonary nodules, largest along the right major fissure. Largest nodule measures 8 mm on sequence 4, image 9. These large nodules have not significantly changed since 08/25/2014. Indeterminate ground-glass pleural-based nodule along the right lower lobe on sequence 4, image 7. Suspect this could be related to atelectasis. Hepatobiliary: Normal appearance of the liver and gallbladder. Pancreas: Normal appearance of the pancreas without inflammation or duct dilatation. Spleen: Normal appearance of spleen without enlargement. Adrenals/Urinary Tract: Again noted is a 1.3 cm low-density nodule in the left adrenal gland which is most compatible with an adenoma based on the previous noncontrast examination. Normal appearance of the right adrenal gland. Again noted are multiple bilateral renal stones. However, there is now gas within multiple calices bilaterally. There is extensive cortical scarring throughout the left kidney. No significant edema around either kidney. Negative for hydronephrosis. There is also gas within the urinary bladder. Stomach/Bowel: Small hiatal hernia. Question a lymph node adjacent to the hiatal hernia on sequence 4, image 33. Otherwise, normal appearance of stomach, small bowel and large bowel. Appendix is normal. Lymphatic: Small lymph nodes in the upper abdomen are nonspecific. Reproductive: Uterus and bilateral adnexa are  unremarkable. Other: No free fluid.  No free intraperitoneal air. Musculoskeletal: Focal sclerosis in the left pubic bone may represent a bone island. 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 There is occlusion of the proximal left internal iliac artery. There is a short-segment occlusion in the left profunda femoral artery. There is distal occlusion of the left runoff vessels without significant arterial flow identified in the left foot. No significant atherosclerotic disease in the right lower extremity. There is distal disease in the right anterior tibial artery and within the right dorsalis pedis artery. Embolic disease in this area cannot be excluded. Low-density or cystic structures in left popliteal region near the left popliteal artery. This may represent adventitial cystic disease but there does not appear to be significant compression or narrowing of the left popliteal artery. NON-VASCULAR There is gas within the urinary bladder and bilateral renal calices. It is possible that this gas is iatrogenic and related to severe reflux. However, the patient's recent urinalysis demonstrates many bacteria and the patient has bilateral kidney stones and extensive scarring in the left kidney. Findings are concerning for a urinary tract infection with a gas-forming organism. Again noted are pulmonary nodules. Largest nodules have not significantly changed since 2015. Some of the pulmonary nodules are indeterminate. This could be better characterized with a dedicated chest CT. These results were called by telephone at the time of interpretation on 02/24/2017 at 6:17 pm to Dr. Fredia Sorrow , who verbally acknowledged these results. Electronically Signed   By: Markus Daft M.D.   On: 02/24/2017 18:32   Anti-infectives: Anti-infectives    Start     Dose/Rate Route Frequency Ordered Stop   02/26/17 0830  fluconazole (DIFLUCAN) tablet  150 mg     150 mg Oral  Once 02/26/17 0729 02/26/17 0942   02/25/17 1600  cefTRIAXone (ROCEPHIN) 1 g in dextrose  5 % 50 mL IVPB     1 g 100 mL/hr over 30 Minutes Intravenous Every 24 hours 02/25/17 1258 03/02/17 1559   02/25/17 0200  cefUROXime (ZINACEF) 1.5 g in dextrose 5 % 50 mL IVPB     1.5 g 100 mL/hr over 30 Minutes Intravenous Every 12 hours 02/25/17 0100 02/25/17 1447   02/24/17 1915  vancomycin (VANCOCIN) IVPB 1000 mg/200 mL premix     1,000 mg 200 mL/hr over 60 Minutes Intravenous  Once 02/24/17 1914 02/24/17 2017      Assessment/Plan: s/p Procedure(s): Left Lower Extremity Embolectomy and Angiogram. (Left) Aortagram, Left lower extremity Run-off (Left) INSERTION OF Common ILIAC STENT Stable overall. Results of echocardiogram pending from this morning. Will obtain CT angiogram tomorrow to allow 1 more day for renal recovery. Transfer to [facility] and continue to mobilize.   LOS: 3 days   Curt Jews 02/27/2017, 10:13 AM

## 2017-02-27 NOTE — Progress Notes (Signed)
[facility] for heparin Indication: Iliac artery occlusion  Allergies  Allergen Reactions  . Macrobid [Nitrofurantoin Macrocrystal] Hives, Shortness Of Breath and Rash  . Other Hives, Shortness Of Breath and Rash    NO "-CILLINS"!!!  . Penicillins Shortness Of Breath    Had had cephalosporins without incident  . Sulfa Antibiotics Hives, Shortness Of Breath and Rash    Patient Measurements: Height: 5\' 7"  (170.2 cm) Weight: 296 lb 3.2 oz (134.4 kg) IBW/kg (Calculated) : 61.6 Heparin Dosing Weight: 91.5 kg  Vital Signs: Temp: 98.7 F (37.1 C) (05/27 0930) Temp Source: Oral (05/27 0930) BP: 121/68 (05/27 1219) Pulse Rate: 113 (05/27 1219)  Labs:  Recent Labs  02/25/17 0553  02/26/17 0315  02/27/17 0306 02/27/17 1125 02/27/17 1807  HGB 10.2*  --  9.2*  --  8.3*  --   --   HCT 31.5*  --  28.7*  --  26.7*  --   --   PLT 273  --  283  --  298  --   --   HEPARINUNFRC  --   < > 0.25*  < > 0.21* 0.44 0.53  CREATININE 1.23*  --  1.38*  --  1.27*  --   --   < > = values in this interval not displayed.  Estimated Creatinine Clearance: 82.6 mL/min (A) (by C-G formula based on SCr of 1.27 mg/dL (H)).  Assessment: 41yo F with h/o of DVT not on anticoagulation PTA presented with left lower extremity pain, started on IV heparin. 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  hgb 8.3, plts wnl. Heparin level remains therapeutic. No bleeding noted.   Goal of Therapy:  Heparin level 0.3-0.7 units/ml Monitor platelets by anticoagulation protocol: Yes   Plan:  Continue heparin at 2450units/hr Daily heparin level, CBC F/u plans for PO anticoagulation  Salome Arnt, PharmD, BCPS Pager # [phone] 02/27/2017 7:22 PM

## 2017-02-27 NOTE — Progress Notes (Signed)
[facility]                                                                              Patient Demographics  Terri Sparks, is a 41 y.o. female, DOB - December 26, 1975, GXQ:[id number]  Admit date - 02/24/2017   Admitting Physician Waynetta Sandy, MD  Outpatient Primary MD for the patient is Patient, No Pcp Per  Outpatient specialists:   LOS - 3  days    Chief Complaint  Patient presents with  . Foot Pain  . Urticaria  . Hyperglycemia       Brief summary   Terri Sparks is a 41 y.o. female  with past history significant for venous thromboembolism, drug use, diabetes, hypercalcemia and anxiety really presented for painful cold left lower extremity. Patient is postoperative day 1 for left lower extremity thrombolectomy and iliac artery stenting. Prior to admission patient states she has had no care for her diabetes since her diagnosis. She does not have a doctor.    Assessment & Plan    Principal Problem:   Iliac artery occlusion, left (HCC) - Status post left lower extremity thrombectomy - Management per vascular surgery  Active Problems:   GERD (gastroesophageal reflux disease) -Continue PPI  Chronic kidney disease stage III - Creatinine at baseline 1.2-1.3 - Avoid NSAIDs and other nephrotoxic medications      Tobacco abuse - Consulted on tobacco cessation    Diabetes mellitus, type II ([place]): Uncontrolled with hyperglycemia  - Still CBG in 200s, increase Lantus to 30 units today, increase NovoLog meal coverage to 5 units 3 times a day before meals, continue sliding scale insulin - Hemoglobin A1c 14.0 - Discussed in detail with the patient regarding oral hypoglycemics versus insulin. Patient does not like the side effects of metformin that she was on before. She is motivated to use insulin, does not have insurance. Will need case management assistance with insulin and regular PCP appointments. She has an appointment with [facility].    UTI (urinary tract infection) - Currently on IV Rocephin, urine culture (will add on previous collection)   Vaginitis - Placed on Diflucan 150 mg 1 and the clotrimazole   Code Status: DVT Prophylaxis:  heparin Family Communication: Discussed in detail with the patient, all imaging results, lab results explained to the patient    Disposition Plan: Per primary service   Time Spent in minutes  15 minutes  Procedures:   1.  Aortogram with left lower extremity angiogram 2.  Stent of left common iliac artery with 8 x 86mm iCast 3.  Left lower extremity thromboembolectomy via left common femoral exposure, left popliteal artery exposure and left posterior tibial exposure  Consultants:     Antimicrobials:   IV Rocephin 5/25>>  Diflucan 5/26 one time dose   Medications  Scheduled Meds: . aspirin EC  81 mg Oral Daily  . Chlorhexidine Gluconate Cloth  6 each Topical Q0600  . clotrimazole  1 Applicatorful Vaginal QHS  . docusate sodium  100 mg Oral Daily  . insulin aspart  0-15 Units Subcutaneous TID WC  . insulin aspart  5 Units Subcutaneous TID  WC  . insulin glargine  30 Units Subcutaneous Daily  . mupirocin ointment  1 application Nasal BID  . nicotine  21 mg Transdermal Daily  . pantoprazole  40 mg Oral Daily   Continuous Infusions: . sodium chloride    . sodium chloride 125 mL/hr at 02/25/17 1720  . cefTRIAXone (ROCEPHIN)  IV Stopped (02/26/17 1630)  . heparin 2,450 Units/hr (02/27/17 0942)  . magnesium sulfate 1 - 4 g bolus IVPB     PRN Meds:.sodium chloride, acetaminophen **OR** acetaminophen, alum & mag hydroxide-simeth, bisacodyl, clonazePAM, diphenhydrAMINE, guaiFENesin-dextromethorphan, hydrALAZINE, hydrOXYzine, labetalol, magnesium sulfate 1 - 4 g bolus IVPB, metoprolol tartrate, morphine injection, ondansetron, oxyCODONE-acetaminophen, phenol, potassium chloride, promethazine, senna-docusate   Antibiotics   Anti-infectives    Start     Dose/Rate  Route Frequency Ordered Stop   02/26/17 0830  fluconazole (DIFLUCAN) tablet 150 mg     150 mg Oral  Once 02/26/17 0729 02/26/17 0942   02/25/17 1600  cefTRIAXone (ROCEPHIN) 1 g in dextrose 5 % 50 mL IVPB     1 g 100 mL/hr over 30 Minutes Intravenous Every 24 hours 02/25/17 1258 03/02/17 1559   02/25/17 0200  cefUROXime (ZINACEF) 1.5 g in dextrose 5 % 50 mL IVPB     1.5 g 100 mL/hr over 30 Minutes Intravenous Every 12 hours 02/25/17 0100 02/25/17 1447   02/24/17 1915  vancomycin (VANCOCIN) IVPB 1000 mg/200 mL premix     1,000 mg 200 mL/hr over 60 Minutes Intravenous  Once 02/24/17 1914 02/24/17 2017        Subjective:   Luetta Nutting Cheramie was seen and examined today. Complaining of the pain in her left leg otherwise itching is better. CBGs still not well controlled. Patient denies dizziness, chest pain, shortness of breath, abdominal pain, N/V/D/C, new weakness, numbess, tingling. No acute events overnight.    Objective:   Vitals:   02/26/17 2320 02/27/17 0319 02/27/17 0930 02/27/17 1219  BP: 105/80 (!) 97/58 117/60 121/68  Pulse: 95 99 94 (!) 113  Resp: 16 14 20  (!) 21  Temp: 98.8 F (37.1 C) 97.8 F (36.6 C) 98.7 F (37.1 C)   TempSrc: Oral Oral Oral   SpO2: 99% 95% 94% 100%  Weight:    134.4 kg (296 lb 3.2 oz)  Height:    5\' 7"  (1.702 m)    Intake/Output Summary (Last 24 hours) at 02/27/17 1245 Last data filed at 02/27/17 0526  Gross per 24 hour  Intake          4073.12 ml  Output             2125 ml  Net          1948.12 ml     Wt Readings from Last 3 Encounters:  02/27/17 134.4 kg (296 lb 3.2 oz)  10/10/16 116.1 kg (256 lb)  08/28/16 122.5 kg (270 lb)     Exam  General: Alert and oriented x 3, NAD  HEENT:  PERRLA, EOMI  Neck: Supple, no JVD, no masses  Cardiovascular: S1 and S2 clear, RRR   Respiratory: CTA B   Gastrointestinal: Soft, NT, ND, NBS  Ext: no cyanosis clubbing or edema  Neuro: no focal neurological deficits   Skin: No  rashes  Psych: Normal affect and demeanor, alert and oriented x3    Data Reviewed:  I have personally reviewed following labs and imaging studies  Micro Results Recent Results (from the past 240 hour(s))  MRSA PCR Screening  Status: Abnormal   Collection Time: 02/25/17  5:13 AM  Result Value Ref Range Status   MRSA by PCR POSITIVE (A) NEGATIVE Final    Comment:        The GeneXpert MRSA Assay (FDA approved for NASAL specimens only), is one component of a comprehensive MRSA colonization surveillance program. It is not intended to diagnose MRSA infection nor to guide or monitor treatment for MRSA infections. Results Called to: RN Marylou Mccoy [id number] [place]     Radiology Reports Ct Angio Aortobifemoral W And/or Wo Contrast  Result Date: 02/24/2017 CLINICAL DATA:  Left foot pain ischemia with untreated diabetes. History of DVT. EXAM: CT ANGIOGRAPHY OF ABDOMINAL AORTA WITH ILIOFEMORAL RUNOFF TECHNIQUE: Multidetector CT imaging of the abdomen, pelvis and lower extremities was performed using the standard protocol during bolus administration of intravenous contrast. Multiplanar CT image reconstructions and MIPs were obtained to evaluate the vascular anatomy. CONTRAST:  125 mL Isovue 370 COMPARISON:  Abdominal CT 08/17/2015 FINDINGS: VASCULAR Aorta: Normal caliber of the abdominal aorta without significant atherosclerotic disease and no evidence for dissection. Celiac: Patent without evidence of aneurysm, dissection, vasculitis or significant stenosis. SMA: Patent without evidence of aneurysm, dissection, vasculitis or significant stenosis. Incidentally, there is a replaced right hepatic artery which is a normal anatomic variant. Renals: Both renal arteries are patent without evidence of aneurysm, dissection, vasculitis, fibromuscular dysplasia or significant stenosis. IMA: Proximal IMA is patent. Cannot exclude clot or occlusion in the mid segment on sequence 4, image 103. RIGHT Lower  Extremity Inflow: Common, internal and external iliac arteries are patent without evidence of aneurysm, dissection, vasculitis or significant stenosis. Outflow: Mild atherosclerotic disease in the right common femoral artery without significant stenosis. The proximal deep femoral arteries are patent. Right SFA is patent without significant atherosclerotic disease. Right popliteal artery is patent without significant atherosclerotic disease. Runoff: Three-vessel runoff in the right lower extremity. The distal anterior tibial artery is small. Distal anterior tibial artery may occlude near the ankle. Posterior tibial artery is patent across the ankle. No significant flow in the proximal dorsalis pedis artery. LEFT Lower Extremity Inflow: Irregular thrombus throughout the left common iliac artery and this is suggestive for acute/subacute thrombus rather than chronic thrombus. Proximal left internal iliac artery is occluded and this may represent acute thrombosis. Left external iliac artery is patent. Outflow: Mild atherosclerotic disease in the left common femoral artery without significant stenosis. Short-segment proximal occlusion of the left profunda femoral artery. There is reconstitution of the deep left femoral artery. The left SFA is patent. Left popliteal artery is patent. There are low-density structures adjacent to the anterior aspect of the popliteal artery. There is another low-density structure lateral to the left popliteal artery. Findings could be associated with adventitial cystic disease. There is no significant compression on the left popliteal artery. Runoff: Runoff vessels are patent proximally. The left anterior tibial artery occludes in the mid calf. There is a segmental occlusion in the distal left peroneal artery. Distal occlusion of the posterior tibial artery. These occlusions within the runoff vessels are likely secondary to embolic disease. No significant arterial flow identified in the left  foot. Veins: Prominent superficial veins along the medial aspect of the right thigh. Findings are suggestive for superficial venous insufficiency. Review of the MIP images confirms the above findings. NON-VASCULAR Lower chest: Several small pulmonary nodules, largest along the right major fissure. Largest nodule measures 8 mm on sequence 4, image 9. These large nodules have not significantly changed since 08/25/2014. Indeterminate  ground-glass pleural-based nodule along the right lower lobe on sequence 4, image 7. Suspect this could be related to atelectasis. Hepatobiliary: Normal appearance of the liver and gallbladder. Pancreas: Normal appearance of the pancreas without inflammation or duct dilatation. Spleen: Normal appearance of spleen without enlargement. Adrenals/Urinary Tract: Again noted is a 1.3 cm low-density nodule in the left adrenal gland which is most compatible with an adenoma based on the previous noncontrast examination. Normal appearance of the right adrenal gland. Again noted are multiple bilateral renal stones. However, there is now gas within multiple calices bilaterally. There is extensive cortical scarring throughout the left kidney. No significant edema around either kidney. Negative for hydronephrosis. There is also gas within the urinary bladder. Stomach/Bowel: Small hiatal hernia. Question a lymph node adjacent to the hiatal hernia on sequence 4, image 33. Otherwise, normal appearance of stomach, small bowel and large bowel. Appendix is normal. Lymphatic: Small lymph nodes in the upper abdomen are nonspecific. Reproductive: Uterus and bilateral adnexa are unremarkable. Other: No free fluid.  No free intraperitoneal air. Musculoskeletal: Focal sclerosis in the left pubic bone may represent a bone island. 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 There is occlusion of the proximal left internal iliac artery.  There is a short-segment occlusion in the left profunda femoral artery. There is distal occlusion of the left runoff vessels without significant arterial flow identified in the left foot. No significant atherosclerotic disease in the right lower extremity. There is distal disease in the right anterior tibial artery and within the right dorsalis pedis artery. Embolic disease in this area cannot be excluded. Low-density or cystic structures in left popliteal region near the left popliteal artery. This may represent adventitial cystic disease but there does not appear to be significant compression or narrowing of the left popliteal artery. NON-VASCULAR There is gas within the urinary bladder and bilateral renal calices. It is possible that this gas is iatrogenic and related to severe reflux. However, the patient's recent urinalysis demonstrates many bacteria and the patient has bilateral kidney stones and extensive scarring in the left kidney. Findings are concerning for a urinary tract infection with a gas-forming organism. Again noted are pulmonary nodules. Largest nodules have not significantly changed since 2015. Some of the pulmonary nodules are indeterminate. This could be better characterized with a dedicated chest CT. These results were called by telephone at the time of interpretation on 02/24/2017 at 6:17 pm to Dr. Fredia Sorrow , who verbally acknowledged these results. Electronically Signed   By: Markus Daft M.D.   On: 02/24/2017 18:32    Lab Data:  CBC:  Recent Labs Lab 02/24/17 1557  02/24/17 2046 02/24/17 2159 02/25/17 0553 02/26/17 0315 02/27/17 0306  WBC 17.8*  --   --   --  23.5* 15.8* 12.8*  NEUTROABS 14.5*  --   --   --   --  10.0*  --   HGB 13.6  < > 11.9* 11.2* 10.2* 9.2* 8.3*  HCT 40.8  < > 35.0* 33.0* 31.5* 28.7* 26.7*  MCV 86.1  --   --   --  86.3 89.1 88.1  PLT 294  --   --   --  273 283 298  < > = values in this interval not displayed. Basic Metabolic Panel:  Recent  Labs Lab 02/24/17 1557  02/24/17 2046 02/24/17 2159 02/25/17 0553 02/26/17 0315 02/27/17 0306  NA 130*  < > 134* 135 134*  130* 131*  K 3.8  < > 4.3 4.5 3.6 3.9 4.0  CL 98*  --   --   --  105 103 102  CO2 18*  --   --   --  21* 18* 23  GLUCOSE 521*  < > 300* 236* 106* 291* 295*  BUN 11  --   --   --  11 9 10   CREATININE 1.47*  --   --   --  1.23* 1.38* 1.27*  CALCIUM 8.7*  --   --   --  8.0* 8.3* 8.5*  < > = values in this interval not displayed. GFR: Estimated Creatinine Clearance: 82.6 mL/min (A) (by C-G formula based on SCr of 1.27 mg/dL (H)). Liver Function Tests:  Recent Labs Lab 02/24/17 1557  AST 27  ALT 18  ALKPHOS 91  BILITOT 0.8  PROT 6.3*  ALBUMIN 2.9*   No results for input(s): LIPASE, AMYLASE in the last 168 hours. No results for input(s): AMMONIA in the last 168 hours. Coagulation Profile: No results for input(s): INR, PROTIME in the last 168 hours. Cardiac Enzymes: No results for input(s): CKTOTAL, CKMB, CKMBINDEX, TROPONINI in the last 168 hours. BNP (last 3 results) No results for input(s): PROBNP in the last 8760 hours. HbA1C:  Recent Labs  02/25/17 0821  HGBA1C 14.0*   CBG:  Recent Labs Lab 02/26/17 1152 02/26/17 1616 02/26/17 2122 02/27/17 0836 02/27/17 1217  GLUCAP 179* 207* 284* 248* 208*   Lipid Profile: No results for input(s): CHOL, HDL, LDLCALC, TRIG, CHOLHDL, LDLDIRECT in the last 72 hours. Thyroid Function Tests: No results for input(s): TSH, T4TOTAL, FREET4, T3FREE, THYROIDAB in the last 72 hours. Anemia Panel: No results for input(s): VITAMINB12, FOLATE, FERRITIN, TIBC, IRON, RETICCTPCT in the last 72 hours. Urine analysis:    Component Value Date/Time   COLORURINE YELLOW 02/26/2017 2223   APPEARANCEUR HAZY (A) 02/26/2017 2223   LABSPEC 1.007 02/26/2017 2223   PHURINE 6.0 02/26/2017 2223   GLUCOSEU >=500 (A) 02/26/2017 2223   HGBUR SMALL (A) 02/26/2017 2223   BILIRUBINUR NEGATIVE 02/26/2017 [place]  02/26/2017 2223   PROTEINUR NEGATIVE 02/26/2017 2223   UROBILINOGEN 0.2 08/16/2015 2305   NITRITE NEGATIVE 02/26/2017 2223   LEUKOCYTESUR LARGE (A) 02/26/2017 2223     Kaenan Jake M.D. [facility] 02/27/2017, 12:45 PM  Pager: [phone] Between 7am to 7pm - call Pager - [phone]  After 7pm go to [email or web address] - password [email or web address]  Call night coverage person covering after 7pm

## 2017-02-27 NOTE — Progress Notes (Signed)
[place] for heparin Indication: Iliac artery occlusion  Allergies  Allergen Reactions  . Macrobid [Nitrofurantoin Macrocrystal] Hives, Shortness Of Breath and Rash  . Other Hives, Shortness Of Breath and Rash    NO "-CILLINS"!!!  . Penicillins Shortness Of Breath    Had had cephalosporins without incident  . Sulfa Antibiotics Hives, Shortness Of Breath and Rash    Patient Measurements: Height: 5\' 7"  (170.2 cm) Weight: 296 lb 3.2 oz (134.4 kg) IBW/kg (Calculated) : 61.6 Heparin Dosing Weight: 91.5 kg  Vital Signs: Temp: 98.7 F (37.1 C) (05/27 0930) Temp Source: Oral (05/27 0930) BP: 121/68 (05/27 1219) Pulse Rate: 113 (05/27 1219)  Labs:  Recent Labs  02/25/17 0553  02/26/17 0315  02/26/17 1944 02/27/17 0306 02/27/17 1125  HGB 10.2*  --  9.2*  --   --  8.3*  --   HCT 31.5*  --  28.7*  --   --  26.7*  --   PLT 273  --  283  --   --  298  --   HEPARINUNFRC  --   < > 0.25*  < > 0.40 0.21* 0.44  CREATININE 1.23*  --  1.38*  --   --  1.27*  --   < > = values in this interval not displayed.  Estimated Creatinine Clearance: 82.6 mL/min (A) (by C-G formula based on SCr of 1.27 mg/dL (H)).  Assessment: 41yo F with h/o of DVT not on anticoagulation PTA presented with left lower extremity pain, started on IV heparin. 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  hgb 8.3, plts wnl. Heparin level therapeutic.   Goal of Therapy:  Heparin level 0.3-0.7 units/ml Monitor platelets by anticoagulation protocol: Yes    Plan:  Continue heparin at 2450units/hr Daily heparin level, CBC F/u plans for PO anticoagulation    Hughes Better, PharmD, BCPS Clinical Pharmacist 02/27/2017 2:13 PM

## 2017-02-27 NOTE — Progress Notes (Signed)
[place] for heparin Indication: Iliac artery occlusion  Allergies  Allergen Reactions  . Macrobid [Nitrofurantoin Macrocrystal] Hives, Shortness Of Breath and Rash  . Other Hives, Shortness Of Breath and Rash    NO "-CILLINS"!!!  . Penicillins Shortness Of Breath    Had had cephalosporins without incident  . Sulfa Antibiotics Hives, Shortness Of Breath and Rash    Patient Measurements: Height: 5\' 7"  (170.2 cm) Weight: 276 lb (125.2 kg) IBW/kg (Calculated) : 61.6 Heparin Dosing Weight: 91.5 kg  Vital Signs: Temp: 97.8 F (36.6 C) (05/27 0319) Temp Source: Oral (05/27 0319) BP: 97/58 (05/27 0319) Pulse Rate: 99 (05/27 0319)  Labs:  Recent Labs  02/24/17 1557  02/25/17 0553  02/26/17 0315 02/26/17 1228 02/26/17 1944 02/27/17 0306  HGB 13.6  < > 10.2*  --  9.2*  --   --  8.3*  HCT 40.8  < > 31.5*  --  28.7*  --   --  26.7*  PLT 294  --  273  --  283  --   --  298  HEPARINUNFRC  --   --   --   < > 0.25* 0.38 0.40 0.21*  CREATININE 1.47*  --  1.23*  --  1.38*  --   --   --   < > = values in this interval not displayed.  Estimated Creatinine Clearance: 72.9 mL/min (A) (by C-G formula based on SCr of 1.38 mg/dL (H)).  Assessment: 41 yo female with h/o of DVT, found to have iliac artery occlusion and now s/p left lower extremity thrombectomy, for heparin  Goal of Therapy:  Heparin level 0.3-0.7 units/ml Monitor platelets by anticoagulation protocol: Yes  Plan:  Increase Heparin 2450 units/hr Check heparin level in 8 hours.   Phillis Knack, PharmD, BCPS

## 2017-02-28 LAB — GLUCOSE, CAPILLARY
Glucose-Capillary: 245 mg/dL — ABNORMAL HIGH (ref 65–99)
Glucose-Capillary: 314 mg/dL — ABNORMAL HIGH (ref 65–99)
Glucose-Capillary: 157 mg/dL — ABNORMAL HIGH (ref 65–99)
Glucose-Capillary: 254 mg/dL — ABNORMAL HIGH (ref 65–99)

## 2017-02-28 LAB — BASIC METABOLIC PANEL
Anion gap: 7 (ref 5–15)
BUN: 10 mg/dL (ref 6–20)
CO2: 22 mmol/L (ref 22–32)
Calcium: 8.9 mg/dL (ref 8.9–10.3)
Chloride: 103 mmol/L (ref 101–111)
Creatinine, Ser: 1.46 mg/dL — ABNORMAL HIGH (ref 0.44–1.00)
GFR calc Af Amer: 50 mL/min — ABNORMAL LOW (ref >60)
GFR calc non Af Amer: 43 mL/min — ABNORMAL LOW (ref >60)
Glucose, Bld: 235 mg/dL — ABNORMAL HIGH (ref 65–99)
Potassium: 4.3 mmol/L (ref 3.5–5.1)
Sodium: 132 mmol/L — ABNORMAL LOW (ref 135–145)

## 2017-02-28 LAB — HEPARIN LEVEL (UNFRACTIONATED): Heparin Unfractionated: 0.42 IU/mL (ref 0.30–0.70)

## 2017-02-28 LAB — CBC
HCT: 27.1 % — ABNORMAL LOW (ref 36.0–46.0)
Hemoglobin: 8.3 g/dL — ABNORMAL LOW (ref 12.0–15.0)
MCH: 27.3 pg (ref 26.0–34.0)
MCHC: 30.6 g/dL (ref 30.0–36.0)
MCV: 89.1 fL (ref 78.0–100.0)
Platelets: 345 10*3/uL (ref 150–400)
RBC: 3.04 MIL/uL — ABNORMAL LOW (ref 3.87–5.11)
RDW: 15.5 % (ref 11.5–15.5)
WBC: 14.4 10*3/uL — ABNORMAL HIGH (ref 4.0–10.5)

## 2017-02-28 MED ORDER — INSULIN GLARGINE 100 UNIT/ML ~~LOC~~ SOLN
35.0000 [IU] | Freq: Every day | SUBCUTANEOUS | Status: DC
  Administered 2017-02-28 – 2017-03-01 (×2): 35 [IU] via SUBCUTANEOUS
  Filled 2017-02-28 (×2): qty 0.35

## 2017-02-28 MED ORDER — CLONAZEPAM 0.5 MG PO TABS
0.5000 mg | ORAL_TABLET | Freq: Three times a day (TID) | ORAL | Status: DC | PRN
  Administered 2017-03-01 – 2017-03-03 (×8): 0.5 mg via ORAL
  Filled 2017-02-28 (×8): qty 1

## 2017-02-28 MED ORDER — INSULIN ASPART 100 UNIT/ML ~~LOC~~ SOLN
8.0000 [IU] | Freq: Three times a day (TID) | SUBCUTANEOUS | Status: DC
  Administered 2017-02-28 (×3): 8 [IU] via SUBCUTANEOUS

## 2017-02-28 NOTE — Progress Notes (Signed)
Physical Therapy Treatment Patient Details Name: Terri Sparks MRN: [id number] DOB: [birth year] Today's Date: 02/28/2017    History of Present Illness This is a 41 y.o. female with history of diabetes and drug abuse presents with history of painful, cold, pale left foot and ankle no w/p Stent of left common iliac artery and Left lower extremity thromboembolectomy via left common femoral exposure, left popliteal artery exposure and left posterior tibial exposure    PT Comments    Pt doing well with mobility and no further PT needed.  Ready for dc from PT standpoint.     Follow Up Recommendations  No PT follow up     Equipment Recommendations  None recommended by PT    Recommendations for Other Services       Precautions / Restrictions Precautions Precautions: Fall Restrictions Weight Bearing Restrictions: No    Mobility  Bed Mobility Overal bed mobility: Independent                Transfers Overall transfer level: Independent Equipment used: None Transfers: Sit to/from Stand Sit to Stand: Independent            Ambulation/Gait Ambulation/Gait assistance: Modified independent (Device/Increase time) Ambulation Distance (Feet): 350 Feet Assistive device: None (Pushing IV pole) Gait Pattern/deviations: Step-through pattern Gait velocity: decr Gait velocity interpretation: Below normal speed for age/gender General Gait Details: Steady gait with or without IV pole   Stairs            Wheelchair Mobility    Modified Rankin (Stroke Patients Only)       Balance Overall balance assessment: Independent                                          Cognition Arousal/Alertness: Awake/alert Behavior During Therapy: WFL for tasks assessed/performed Overall Cognitive Status: Within Functional Limits for tasks assessed                                        Exercises      General Comments        Pertinent  Vitals/Pain Pain Assessment: Faces Faces Pain Scale: Hurts a little bit Pain Location: LLE Pain Descriptors / Indicators: Sore Pain Intervention(s): Limited activity within patient's tolerance    Home Living                      Prior Function            PT Goals (current goals can now be found in the care plan section) Progress towards PT goals: Goals met/education completed, patient discharged from PT    Frequency           PT Plan Discharge plan needs to be updated    Co-evaluation              AM-PAC PT "6 Clicks" Daily Activity  Outcome Measure  Difficulty turning over in bed (including adjusting bedclothes, sheets and blankets)?: None Difficulty moving from lying on back to sitting on the side of the bed? : None Difficulty sitting down on and standing up from a chair with arms (e.g., wheelchair, bedside commode, etc,.)?: None Help needed moving to and from a bed to chair (including a wheelchair)?: None Help needed walking in hospital room?: None Help needed  climbing 3-5 steps with a railing? : None 6 Click Score: 24    End of Session   Activity Tolerance: Patient tolerated treatment well Patient left: with call bell/phone within reach;in chair   PT Visit Diagnosis: Pain Pain - Right/Left: Left Pain - part of body: Leg     Time: 9437-0052 PT Time Calculation (min) (ACUTE ONLY): 30 min  Charges:  $Gait Training: 8-22 mins                    G Codes:       [facility] PT [place] 02/28/2017, 3:21 PM

## 2017-02-28 NOTE — Progress Notes (Signed)
[facility]                                                                              Patient Demographics  Terri Sparks, is a 41 y.o. female, DOB - 06-Sep-1976, ZYS:[id number]  Admit date - 02/24/2017   Admitting Physician Waynetta Sandy, MD  Outpatient Primary MD for the patient is Patient, No Pcp Per  Outpatient specialists:   LOS - 4  days    Chief Complaint  Patient presents with  . Foot Pain  . Urticaria  . Hyperglycemia       Brief summary   Terri Sparks is a 41 y.o. female  with past history significant for venous thromboembolism, drug use, diabetes, hypercalcemia and anxiety really presented for painful cold left lower extremity. Patient is postoperative day 1 for left lower extremity thrombolectomy and iliac artery stenting. Prior to admission patient states she has had no care for her diabetes since her diagnosis. She does not have a doctor.    Assessment & Plan    Principal Problem:   Iliac artery occlusion, left (HCC) - Status post left lower extremity thrombectomy - Management per vascular surgery  Active Problems:   GERD (gastroesophageal reflux disease) -Continue PPI  Chronic kidney disease stage III - Creatinine at baseline 1.2-1.3 - Avoid NSAIDs and other nephrotoxic medications  - Creatinine slowly trending up 1.4 today     Tobacco abuse - Consulted on tobacco cessation    Diabetes mellitus, type II ([place]): Uncontrolled with hyperglycemia  - Still CBG in 200s,  - Increased Lantus to 35 units daily, NovoLog to 8 units TID before meals, continue sliding scale insulin - Hemoglobin A1c 14.0 - She is motivated to use insulin, does not have insurance. Will need case management assistance with insulin and regular PCP appointments. She has an appointment with [place].    UTI (urinary tract infection) - Currently on IV Rocephin, urine culture (will add on previous collection)   Vaginitis - Placed on  Diflucan 150 mg 1 and the clotrimazole   Code Status: DVT Prophylaxis:  heparin Family Communication: Discussed in detail with the patient, all imaging results, lab results explained to the patient    Disposition Plan: Per primary service   Time Spent in minutes  15 minutes  Procedures:   1.  Aortogram with left lower extremity angiogram 2.  Stent of left common iliac artery with 8 x 47mm iCast 3.  Left lower extremity thromboembolectomy via left common femoral exposure, left popliteal artery exposure and left posterior tibial exposure  Consultants:     Antimicrobials:   IV Rocephin 5/25>>  Diflucan 5/26 one time dose   Medications  Scheduled Meds: . aspirin EC  81 mg Oral Daily  . Chlorhexidine Gluconate Cloth  6 each Topical Q0600  . clotrimazole  1 Applicatorful Vaginal QHS  . docusate sodium  100 mg Oral Daily  . insulin aspart  0-15 Units Subcutaneous TID WC  . insulin aspart  8 Units Subcutaneous TID WC  . insulin glargine  35 Units Subcutaneous Daily  . mupirocin ointment  1 application Nasal BID  . nicotine  21 mg Transdermal Daily  . pantoprazole  40 mg Oral Daily   Continuous Infusions: . sodium chloride    . sodium chloride 125 mL/hr at 02/25/17 1720  . cefTRIAXone (ROCEPHIN)  IV Stopped (02/27/17 1837)  . heparin 2,450 Units/hr (02/28/17 0500)  . magnesium sulfate 1 - 4 g bolus IVPB     PRN Meds:.sodium chloride, acetaminophen **OR** acetaminophen, alum & mag hydroxide-simeth, bisacodyl, clonazePAM, diphenhydrAMINE, guaiFENesin-dextromethorphan, hydrALAZINE, hydrOXYzine, labetalol, magnesium sulfate 1 - 4 g bolus IVPB, metoprolol tartrate, morphine injection, ondansetron, oxyCODONE-acetaminophen, phenol, potassium chloride, promethazine, senna-docusate   Antibiotics   Anti-infectives    Start     Dose/Rate Route Frequency Ordered Stop   02/26/17 0830  fluconazole (DIFLUCAN) tablet 150 mg     150 mg Oral  Once 02/26/17 0729 02/26/17 0942   02/25/17  1600  cefTRIAXone (ROCEPHIN) 1 g in dextrose 5 % 50 mL IVPB     1 g 100 mL/hr over 30 Minutes Intravenous Every 24 hours 02/25/17 1258 03/02/17 1559   02/25/17 0200  cefUROXime (ZINACEF) 1.5 g in dextrose 5 % 50 mL IVPB     1.5 g 100 mL/hr over 30 Minutes Intravenous Every 12 hours 02/25/17 0100 02/25/17 1447   02/24/17 1915  vancomycin (VANCOCIN) IVPB 1000 mg/200 mL premix     1,000 mg 200 mL/hr over 60 Minutes Intravenous  Once 02/24/17 1914 02/24/17 2017        Subjective:   [name] was seen and examined today. CBG is still not well-controlled. Still complaining of leg pain.  Patient denies dizziness, chest pain, shortness of breath, abdominal pain, N/V/D/C, new weakness, numbess, tingling. No acute events overnight.    Objective:   Vitals:   02/27/17 0930 02/27/17 1219 02/27/17 2003 02/28/17 0458  BP: 117/60 121/68 105/74 (!) 118/59  Pulse: 94 (!) 113 94 89  Resp: 20 (!) 21 18 20   Temp: 98.7 F (37.1 C)  98.1 F (36.7 C) 98.1 F (36.7 C)  TempSrc: Oral  Oral Axillary  SpO2: 94% 100% 100% 98%  Weight:  134.4 kg (296 lb 3.2 oz)    Height:  5\' 7"  (1.702 m)      Intake/Output Summary (Last 24 hours) at 02/28/17 1229 Last data filed at 02/28/17 0600  Gross per 24 hour  Intake             1980 ml  Output                0 ml  Net             1980 ml     Wt Readings from Last 3 Encounters:  02/27/17 134.4 kg (296 lb 3.2 oz)  10/10/16 116.1 kg (256 lb)  08/28/16 122.5 kg (270 lb)     Exam  General: Alert and oriented x 3, NAD  HEENT:  PERRLA, EOMI  Neck: Supple, no JVD, no masses  Cardiovascular: S1 and S2 clear, RRR   Respiratory: CTA B   Gastrointestinal: Soft, NT, ND, NBS  Ext: no cyanosis clubbing or edema  Neuro: no focal neurological deficits   Skin: No rashes  Psych: Normal affect and demeanor, alert and oriented x3    Data Reviewed:  I have personally reviewed following labs and imaging studies  Micro Results Recent Results (from  the past 240 hour(s))  MRSA PCR Screening     Status: Abnormal   Collection Time: 02/25/17  5:13 AM  Result Value Ref Range Status   MRSA by  PCR POSITIVE (A) NEGATIVE Final    Comment:        The GeneXpert MRSA Assay (FDA approved for NASAL specimens only), is one component of a comprehensive MRSA colonization surveillance program. It is not intended to diagnose MRSA infection nor to guide or monitor treatment for MRSA infections. Results Called to: RN Marylou Mccoy [id number] [place]     Radiology Reports Ct Angio Aortobifemoral W And/or Wo Contrast  Result Date: 02/24/2017 CLINICAL DATA:  Left foot pain ischemia with untreated diabetes. History of DVT. EXAM: CT ANGIOGRAPHY OF ABDOMINAL AORTA WITH ILIOFEMORAL RUNOFF TECHNIQUE: Multidetector CT imaging of the abdomen, pelvis and lower extremities was performed using the standard protocol during bolus administration of intravenous contrast. Multiplanar CT image reconstructions and MIPs were obtained to evaluate the vascular anatomy. CONTRAST:  125 mL Isovue 370 COMPARISON:  Abdominal CT 08/17/2015 FINDINGS: VASCULAR Aorta: Normal caliber of the abdominal aorta without significant atherosclerotic disease and no evidence for dissection. Celiac: Patent without evidence of aneurysm, dissection, vasculitis or significant stenosis. SMA: Patent without evidence of aneurysm, dissection, vasculitis or significant stenosis. Incidentally, there is a replaced right hepatic artery which is a normal anatomic variant. Renals: Both renal arteries are patent without evidence of aneurysm, dissection, vasculitis, fibromuscular dysplasia or significant stenosis. IMA: Proximal IMA is patent. Cannot exclude clot or occlusion in the mid segment on sequence 4, image 103. RIGHT Lower Extremity Inflow: Common, internal and external iliac arteries are patent without evidence of aneurysm, dissection, vasculitis or significant stenosis. Outflow: Mild atherosclerotic disease in  the right common femoral artery without significant stenosis. The proximal deep femoral arteries are patent. Right SFA is patent without significant atherosclerotic disease. Right popliteal artery is patent without significant atherosclerotic disease. Runoff: Three-vessel runoff in the right lower extremity. The distal anterior tibial artery is small. Distal anterior tibial artery may occlude near the ankle. Posterior tibial artery is patent across the ankle. No significant flow in the proximal dorsalis pedis artery. LEFT Lower Extremity Inflow: Irregular thrombus throughout the left common iliac artery and this is suggestive for acute/subacute thrombus rather than chronic thrombus. Proximal left internal iliac artery is occluded and this may represent acute thrombosis. Left external iliac artery is patent. Outflow: Mild atherosclerotic disease in the left common femoral artery without significant stenosis. Short-segment proximal occlusion of the left profunda femoral artery. There is reconstitution of the deep left femoral artery. The left SFA is patent. Left popliteal artery is patent. There are low-density structures adjacent to the anterior aspect of the popliteal artery. There is another low-density structure lateral to the left popliteal artery. Findings could be associated with adventitial cystic disease. There is no significant compression on the left popliteal artery. Runoff: Runoff vessels are patent proximally. The left anterior tibial artery occludes in the mid calf. There is a segmental occlusion in the distal left peroneal artery. Distal occlusion of the posterior tibial artery. These occlusions within the runoff vessels are likely secondary to embolic disease. No significant arterial flow identified in the left foot. Veins: Prominent superficial veins along the medial aspect of the right thigh. Findings are suggestive for superficial venous insufficiency. Review of the MIP images confirms the above  findings. NON-VASCULAR Lower chest: Several small pulmonary nodules, largest along the right major fissure. Largest nodule measures 8 mm on sequence 4, image 9. These large nodules have not significantly changed since 08/25/2014. Indeterminate ground-glass pleural-based nodule along the right lower lobe on sequence 4, image 7. Suspect this could be related to atelectasis.  Hepatobiliary: Normal appearance of the liver and gallbladder. Pancreas: Normal appearance of the pancreas without inflammation or duct dilatation. Spleen: Normal appearance of spleen without enlargement. Adrenals/Urinary Tract: Again noted is a 1.3 cm low-density nodule in the left adrenal gland which is most compatible with an adenoma based on the previous noncontrast examination. Normal appearance of the right adrenal gland. Again noted are multiple bilateral renal stones. However, there is now gas within multiple calices bilaterally. There is extensive cortical scarring throughout the left kidney. No significant edema around either kidney. Negative for hydronephrosis. There is also gas within the urinary bladder. Stomach/Bowel: Small hiatal hernia. Question a lymph node adjacent to the hiatal hernia on sequence 4, image 33. Otherwise, normal appearance of stomach, small bowel and large bowel. Appendix is normal. Lymphatic: Small lymph nodes in the upper abdomen are nonspecific. Reproductive: Uterus and bilateral adnexa are unremarkable. Other: No free fluid.  No free intraperitoneal air. Musculoskeletal: Focal sclerosis in the left pubic bone may represent a bone island. 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 There is occlusion of the proximal left internal iliac artery. There is a short-segment occlusion in the left profunda femoral artery. There is distal occlusion of the left runoff vessels without significant arterial flow identified in the left foot. No  significant atherosclerotic disease in the right lower extremity. There is distal disease in the right anterior tibial artery and within the right dorsalis pedis artery. Embolic disease in this area cannot be excluded. Low-density or cystic structures in left popliteal region near the left popliteal artery. This may represent adventitial cystic disease but there does not appear to be significant compression or narrowing of the left popliteal artery. NON-VASCULAR There is gas within the urinary bladder and bilateral renal calices. It is possible that this gas is iatrogenic and related to severe reflux. However, the patient's recent urinalysis demonstrates many bacteria and the patient has bilateral kidney stones and extensive scarring in the left kidney. Findings are concerning for a urinary tract infection with a gas-forming organism. Again noted are pulmonary nodules. Largest nodules have not significantly changed since 2015. Some of the pulmonary nodules are indeterminate. This could be better characterized with a dedicated chest CT. These results were called by telephone at the time of interpretation on 02/24/2017 at 6:17 pm to Dr. Fredia Sorrow , who verbally acknowledged these results. Electronically Signed   By: Markus Daft M.D.   On: 02/24/2017 18:32    Lab Data:  CBC:  Recent Labs Lab 02/24/17 1557  02/24/17 2159 02/25/17 0553 02/26/17 0315 02/27/17 0306 02/28/17 0247  WBC 17.8*  --   --  23.5* 15.8* 12.8* 14.4*  NEUTROABS 14.5*  --   --   --  10.0*  --   --   HGB 13.6  < > 11.2* 10.2* 9.2* 8.3* 8.3*  HCT 40.8  < > 33.0* 31.5* 28.7* 26.7* 27.1*  MCV 86.1  --   --  86.3 89.1 88.1 89.1  PLT 294  --   --  273 283 298 345  < > = values in this interval not displayed. Basic Metabolic Panel:  Recent Labs Lab 02/24/17 1557  02/24/17 2159 02/25/17 0553 02/26/17 0315 02/27/17 0306 02/28/17 0247  NA 130*  < > 135 134* 130* 131* 132*  K 3.8  < > 4.5 3.6 3.9 4.0 4.3  CL 98*  --   --   105 103 102 103  CO2 18*  --   --  21* 18* 23 22  GLUCOSE 521*  < > 236* 106* 291* 295* 235*  BUN 11  --   --  11 9 10 10   CREATININE 1.47*  --   --  1.23* 1.38* 1.27* 1.46*  CALCIUM 8.7*  --   --  8.0* 8.3* 8.5* 8.9  < > = values in this interval not displayed. GFR: Estimated Creatinine Clearance: 71.9 mL/min (A) (by C-G formula based on SCr of 1.46 mg/dL (H)). Liver Function Tests:  Recent Labs Lab 02/24/17 1557  AST 27  ALT 18  ALKPHOS 91  BILITOT 0.8  PROT 6.3*  ALBUMIN 2.9*   No results for input(s): LIPASE, AMYLASE in the last 168 hours. No results for input(s): AMMONIA in the last 168 hours. Coagulation Profile: No results for input(s): INR, PROTIME in the last 168 hours. Cardiac Enzymes: No results for input(s): CKTOTAL, CKMB, CKMBINDEX, TROPONINI in the last 168 hours. BNP (last 3 results) No results for input(s): PROBNP in the last 8760 hours. HbA1C: No results for input(s): HGBA1C in the last 72 hours. CBG:  Recent Labs Lab 02/27/17 0836 02/27/17 1217 02/27/17 1712 02/27/17 2130 02/28/17 0618  GLUCAP 248* 208* 230* 155* 245*   Lipid Profile: No results for input(s): CHOL, HDL, LDLCALC, TRIG, CHOLHDL, LDLDIRECT in the last 72 hours. Thyroid Function Tests: No results for input(s): TSH, T4TOTAL, FREET4, T3FREE, THYROIDAB in the last 72 hours. Anemia Panel: No results for input(s): VITAMINB12, FOLATE, FERRITIN, TIBC, IRON, RETICCTPCT in the last 72 hours. Urine analysis:    Component Value Date/Time   COLORURINE YELLOW 02/26/2017 2223   APPEARANCEUR HAZY (A) 02/26/2017 2223   LABSPEC 1.007 02/26/2017 2223   PHURINE 6.0 02/26/2017 2223   GLUCOSEU >=500 (A) 02/26/2017 2223   HGBUR SMALL (A) 02/26/2017 2223   BILIRUBINUR NEGATIVE 02/26/2017 [place] 02/26/2017 2223   PROTEINUR NEGATIVE 02/26/2017 2223   UROBILINOGEN 0.2 08/16/2015 2305   NITRITE NEGATIVE 02/26/2017 2223   LEUKOCYTESUR LARGE (A) 02/26/2017 2223     Shamila Lerch  M.D. [facility] 02/28/2017, 12:29 PM  Pager: [phone] Between 7am to 7pm - call Pager - [phone]  After 7pm go to [email or web address] - password [email or web address]  Call night coverage person covering after 7pm

## 2017-02-28 NOTE — Progress Notes (Signed)
Physical Therapy Discharge Patient Details Name: Terri Sparks MRN: [id number] DOB: [birth year] Today's Date: 02/28/2017 Time: 4627-0350 PT Time Calculation (min) (ACUTE ONLY): 30 min  Patient discharged from PT services secondary to goals met and no further PT needs identified.  Please see latest therapy progress note for current level of functioning and progress toward goals.    Progress and discharge plan discussed with patient and/or caregiver: Patient/Caregiver agrees with plan  GP     Shary Decamp [facility] 02/28/2017, 3:21 PM  [facility] PT [phone]

## 2017-02-28 NOTE — Progress Notes (Addendum)
Progress Note    02/28/2017 7:37 AM 4 Days Post-Op  Subjective:  Says she's feeling better.  Still has some leg pain.  Says she had some drainage from the ankle incision yesterday  Afebrile HR  100's ST 767'H-419'F systolic 79% RA  Vitals:   02/27/17 2003 02/28/17 0458  BP: 105/74 (!) 118/59  Pulse: 94 89  Resp: 18 20  Temp: 98.1 F (36.7 C) 98.1 F (36.7 C)    Physical Exam: Cardiac:  regular Lungs:  Non labored Incisions:  All are clean and dry; ankle incision with some serosanguinous drainage on the bandage. Extremities:  Brisk left AT doppler signal; left PT and peroneal improved as well.   CBC    Component Value Date/Time   WBC 14.4 (H) 02/28/2017 0247   RBC 3.04 (L) 02/28/2017 0247   HGB 8.3 (L) 02/28/2017 0247   HCT 27.1 (L) 02/28/2017 0247   PLT 345 02/28/2017 0247   MCV 89.1 02/28/2017 0247   MCH 27.3 02/28/2017 0247   MCHC 30.6 02/28/2017 0247   RDW 15.5 02/28/2017 0247   LYMPHSABS 4.6 (H) 02/26/2017 0315   MONOABS 0.8 02/26/2017 0315   EOSABS 0.3 02/26/2017 0315   BASOSABS 0.0 02/26/2017 0315    BMET    Component Value Date/Time   NA 132 (L) 02/28/2017 0247   K 4.3 02/28/2017 0247   CL 103 02/28/2017 0247   CO2 22 02/28/2017 0247   GLUCOSE 235 (H) 02/28/2017 0247   BUN 10 02/28/2017 0247   CREATININE 1.46 (H) 02/28/2017 0247   CALCIUM 8.9 02/28/2017 0247   GFRNONAA 43 (L) 02/28/2017 0247   GFRAA 50 (L) 02/28/2017 0247    INR No results found for: INR   Intake/Output Summary (Last 24 hours) at 02/28/17 0737 Last data filed at 02/28/17 0600  Gross per 24 hour  Intake             1980 ml  Output                0 ml  Net             1980 ml   2D Echocardiogram 02/27/17: Study Conclusions  - Left ventricle: The cavity size was normal. Wall thickness was   increased in a pattern of mild LVH. Systolic function was normal.   The estimated ejection fraction was in the range of 55% to 60%.   Wall motion was normal; there were no  regional wall motion   abnormalities. Left ventricular diastolic function parameters   were normal.  Impressions:  - Normal LV systolic and diastolic function; mild LVH.   Assessment:  41 y.o. female is s/p:  1. Aortogram with left lower extremity angiogram 2. Stent of left common iliac artery with 8 x 94mm iCast 3. Left lower extremity thromboembolectomy via left common femoral exposure, left popliteal artery exposure and left posterior tibial exposure  4 Days Post-Op  Plan: -pt with brisk left AT doppler signal; PT and peroneal doppler signals have improved as well. -pt for CTA this morning -some serosanguinous drainage from ankle incision-most likely from leg swelling.  Talked to pt about leg elevation if she can tolerate it for swelling. -incisions look good-continue dry gauze to left groin -appreciate TRH help with medical management.   -continue mobilization  -DVT prophylaxis:  Heparin gtt   Leontine Locket, PA-C [facility] [phone] 02/28/2017 7:37 AM  I have examined the patient, reviewed and agree with above. Discussed 2-D echo with  patient that there was no abnormalities. Was to have CT angiogram today but creatinine is actually higher than yesterday. Will hold until tomorrow.  Curt Jews, MD 02/28/2017 3:26 PM

## 2017-02-28 NOTE — Progress Notes (Signed)
[place] for heparin Indication: Iliac artery occlusion  Allergies  Allergen Reactions  . Macrobid [Nitrofurantoin Macrocrystal] Hives, Shortness Of Breath and Rash  . Other Hives, Shortness Of Breath and Rash    NO "-CILLINS"!!!  . Penicillins Shortness Of Breath    Had had cephalosporins without incident  . Sulfa Antibiotics Hives, Shortness Of Breath and Rash    Patient Measurements: Height: 5\' 7"  (170.2 cm) Weight: 296 lb 3.2 oz (134.4 kg) IBW/kg (Calculated) : 61.6 Heparin Dosing Weight: 91.5 kg  Vital Signs: Temp: 98.1 F (36.7 C) (05/28 0458) Temp Source: Axillary (05/28 0458) BP: 118/59 (05/28 0458) Pulse Rate: 89 (05/28 0458)  Labs:  Recent Labs  02/26/17 0315  02/27/17 0306 02/27/17 1125 02/27/17 1807 02/28/17 0247 02/28/17 0938  HGB 9.2*  --  8.3*  --   --  8.3*  --   HCT 28.7*  --  26.7*  --   --  27.1*  --   PLT 283  --  298  --   --  345  --   HEPARINUNFRC 0.25*  < > 0.21* 0.44 0.53  --  0.42  CREATININE 1.38*  --  1.27*  --   --  1.46*  --   < > = values in this interval not displayed.  Estimated Creatinine Clearance: 71.9 mL/min (A) (by C-G formula based on SCr of 1.46 mg/dL (H)).  Assessment: 41yo F with h/o of DVT not on anticoagulation PTA presented with left lower extremity pain, started on IV heparin. 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  hgb 8.3, plts wnl. Heparin level (0.42) remains therapeutic on 2450 units/hr. No bleeding noted.   Goal of Therapy:  Heparin level 0.3-0.7 units/ml Monitor platelets by anticoagulation protocol: Yes   Plan:  Continue heparin at 2450units/hr Daily heparin level, CBC F/u plans for PO anticoagulation  Maryanna Shape, PharmD, BCPS  Clinical Pharmacist  Pager: [phone]     02/28/2017 11:02 AM

## 2017-03-01 ENCOUNTER — Inpatient Hospital Stay (HOSPITAL_COMMUNITY): Payer: Self-pay

## 2017-03-01 LAB — BASIC METABOLIC PANEL
Anion gap: 8 (ref 5–15)
BUN: 14 mg/dL (ref 6–20)
CO2: 24 mmol/L (ref 22–32)
Calcium: 9 mg/dL (ref 8.9–10.3)
Chloride: 100 mmol/L — ABNORMAL LOW (ref 101–111)
Creatinine, Ser: 1.05 mg/dL — ABNORMAL HIGH (ref 0.44–1.00)
GFR calc Af Amer: >60 mL/min (ref >60)
GFR calc non Af Amer: >60 mL/min (ref >60)
Glucose, Bld: 195 mg/dL — ABNORMAL HIGH (ref 65–99)
Potassium: 4.9 mmol/L (ref 3.5–5.1)
Sodium: 132 mmol/L — ABNORMAL LOW (ref 135–145)

## 2017-03-01 LAB — GLUCOSE, CAPILLARY
Glucose-Capillary: 193 mg/dL — ABNORMAL HIGH (ref 65–99)
Glucose-Capillary: 161 mg/dL — ABNORMAL HIGH (ref 65–99)
Glucose-Capillary: 126 mg/dL — ABNORMAL HIGH (ref 65–99)
Glucose-Capillary: 214 mg/dL — ABNORMAL HIGH (ref 65–99)

## 2017-03-01 LAB — CBC
HCT: 26.6 % — ABNORMAL LOW (ref 36.0–46.0)
Hemoglobin: 8.2 g/dL — ABNORMAL LOW (ref 12.0–15.0)
MCH: 27.6 pg (ref 26.0–34.0)
MCHC: 30.8 g/dL (ref 30.0–36.0)
MCV: 89.6 fL (ref 78.0–100.0)
Platelets: 405 10*3/uL — ABNORMAL HIGH (ref 150–400)
RBC: 2.97 MIL/uL — ABNORMAL LOW (ref 3.87–5.11)
RDW: 15.7 % — ABNORMAL HIGH (ref 11.5–15.5)
WBC: 13.6 10*3/uL — ABNORMAL HIGH (ref 4.0–10.5)

## 2017-03-01 LAB — HEPARIN LEVEL (UNFRACTIONATED): Heparin Unfractionated: 0.31 IU/mL (ref 0.30–0.70)

## 2017-03-01 MED ORDER — INSULIN ASPART 100 UNIT/ML ~~LOC~~ SOLN
10.0000 [IU] | Freq: Three times a day (TID) | SUBCUTANEOUS | Status: DC
  Administered 2017-03-01 – 2017-03-03 (×8): 10 [IU] via SUBCUTANEOUS

## 2017-03-01 MED ORDER — INSULIN STARTER KIT- PEN NEEDLES (ENGLISH)
1.0000 | Freq: Once | Status: AC
  Administered 2017-03-02: 1
  Filled 2017-03-01: qty 1

## 2017-03-01 MED ORDER — INSULIN GLARGINE 100 UNIT/ML ~~LOC~~ SOLN
40.0000 [IU] | Freq: Every day | SUBCUTANEOUS | Status: DC
  Administered 2017-03-02 – 2017-03-03 (×2): 40 [IU] via SUBCUTANEOUS
  Filled 2017-03-01 (×2): qty 0.4

## 2017-03-01 MED ORDER — IOPAMIDOL (ISOVUE-370) INJECTION 76%
INTRAVENOUS | Status: AC
  Administered 2017-03-01: 100 mL via INTRAVENOUS
  Filled 2017-03-01: qty 100

## 2017-03-01 MED ORDER — GABAPENTIN 100 MG PO CAPS
200.0000 mg | ORAL_CAPSULE | Freq: Three times a day (TID) | ORAL | Status: DC
  Administered 2017-03-01 – 2017-03-03 (×7): 200 mg via ORAL
  Filled 2017-03-01 (×7): qty 2

## 2017-03-01 MED ORDER — CARBAMIDE PEROXIDE 10 % MT SOLN
Freq: Every day | OROMUCOSAL | Status: DC
  Administered 2017-03-01: 1 via DENTAL
  Administered 2017-03-02 – 2017-03-03 (×2): via DENTAL
  Filled 2017-03-01: qty 15

## 2017-03-01 MED ORDER — FLUCONAZOLE 100 MG PO TABS
150.0000 mg | ORAL_TABLET | Freq: Once | ORAL | Status: AC
  Administered 2017-03-01: 150 mg via ORAL
  Filled 2017-03-01: qty 2

## 2017-03-01 NOTE — Progress Notes (Signed)
[facility]                                                                              Patient Demographics  Terri Sparks, is a 41 y.o. female, DOB - Oct 10, 1975, QMV:[id number]  Admit date - 02/24/2017   Admitting Physician Waynetta Sandy, MD  Outpatient Primary MD for the patient is Patient, No Pcp Per  Outpatient specialists:   LOS - 5  days    Chief Complaint  Patient presents with  . Foot Pain  . Urticaria  . Hyperglycemia       Brief summary   Terri Sparks is a 41 y.o. female  with past history significant for venous thromboembolism, drug use, diabetes, hypercalcemia and anxiety really presented for painful cold left lower extremity. Patient is postoperative day 1 for left lower extremity thrombolectomy and iliac artery stenting. Prior to admission patient states she has had no care for her diabetes since her diagnosis. She does not have a doctor.    Assessment & Plan    Principal Problem:   Iliac artery occlusion, left (HCC) - Status post left lower extremity thrombectomy - Management per vascular surgery  Active Problems:   GERD (gastroesophageal reflux disease) -Continue PPI  Chronic kidney disease stage III - Creatinine at baseline 1.2-1.3 - Avoid NSAIDs and other nephrotoxic medications  - Creatinine slowly trending up 1.4 on 5/28, follow bmet today     Tobacco abuse - Consulted on tobacco cessation    Diabetes mellitus, type II ([place]): Uncontrolled with hyperglycemia  - CBGs improving - Increased Lantus to 40 units daily, NovoLog to 10 units TID before meals, continue sliding scale insulin - Hemoglobin A1c 14.0 - She is motivated to use insulin, does not have insurance. Will need case management assistance with insulin and regular PCP appointments. She has an appointment with [facility].     UTI (urinary tract infection) - Currently on IV Rocephin, urine culture was never sent   Vaginitis - Still  complaining of some vaginitis, improved some after first dose of Diflucan.  - Repeat Diflucan 150 mg 1 and continue clotrimazole   Code Status: DVT Prophylaxis:  heparin Family Communication: Discussed in detail with the patient, all imaging results, lab results explained to the patient    Disposition Plan: Per primary service   Time Spent in minutes  25 minutes  Procedures:   1.  Aortogram with left lower extremity angiogram 2.  Stent of left common iliac artery with 8 x 94mm iCast 3.  Left lower extremity thromboembolectomy via left common femoral exposure, left popliteal artery exposure and left posterior tibial exposure  Consultants:     Antimicrobials:   IV Rocephin 5/25>>  Diflucan 5/26 one time dose on 5/26, repeat today on 5/29   Medications  Scheduled Meds: . aspirin EC  81 mg Oral Daily  . clotrimazole  1 Applicatorful Vaginal QHS  . docusate sodium  100 mg Oral Daily  . gabapentin  200 mg Oral TID  . insulin aspart  0-15 Units Subcutaneous TID WC  . insulin aspart  10 Units Subcutaneous TID WC  . insulin glargine  35 Units Subcutaneous Daily  . mupirocin ointment  1 application Nasal BID  . nicotine  21 mg Transdermal Daily  . pantoprazole  40 mg Oral Daily   Continuous Infusions: . sodium chloride    . sodium chloride 125 mL/hr at 02/25/17 1720  . cefTRIAXone (ROCEPHIN)  IV Stopped (02/28/17 1603)  . heparin 2,550 Units/hr (03/01/17 0919)  . magnesium sulfate 1 - 4 g bolus IVPB     PRN Meds:.sodium chloride, acetaminophen **OR** acetaminophen, alum & mag hydroxide-simeth, bisacodyl, clonazePAM, diphenhydrAMINE, guaiFENesin-dextromethorphan, hydrALAZINE, hydrOXYzine, labetalol, magnesium sulfate 1 - 4 g bolus IVPB, metoprolol tartrate, morphine injection, ondansetron, oxyCODONE-acetaminophen, phenol, potassium chloride, promethazine, senna-docusate   Antibiotics   Anti-infectives    Start     Dose/Rate Route Frequency Ordered Stop   02/26/17 0830   fluconazole (DIFLUCAN) tablet 150 mg     150 mg Oral  Once 02/26/17 0729 02/26/17 0942   02/25/17 1600  cefTRIAXone (ROCEPHIN) 1 g in dextrose 5 % 50 mL IVPB     1 g 100 mL/hr over 30 Minutes Intravenous Every 24 hours 02/25/17 1258 03/02/17 1559   02/25/17 0200  cefUROXime (ZINACEF) 1.5 g in dextrose 5 % 50 mL IVPB     1.5 g 100 mL/hr over 30 Minutes Intravenous Every 12 hours 02/25/17 0100 02/25/17 1447   02/24/17 1915  vancomycin (VANCOCIN) IVPB 1000 mg/200 mL premix     1,000 mg 200 mL/hr over 60 Minutes Intravenous  Once 02/24/17 1914 02/24/17 2017        Subjective:   Luetta Nutting Runyan was seen and examined today. CBG is still not well-controlled.  Patient denies dizziness, chest pain, shortness of breath, abdominal pain, N/V/D/C, new weakness, numbess, tingling. No acute events overnight.    Objective:   Vitals:   02/28/17 0458 02/28/17 1437 02/28/17 2001 03/01/17 0500  BP: (!) 118/59 (!) 118/50 137/66 102/76  Pulse: 89 86 97 100  Resp: 20 19 20 20   Temp: 98.1 F (36.7 C) 98.3 F (36.8 C) 97.9 F (36.6 C) 98.3 F (36.8 C)  TempSrc: Axillary Oral Oral Oral  SpO2: 98% 100% 100% 100%  Weight:      Height:        Intake/Output Summary (Last 24 hours) at 03/01/17 1211 Last data filed at 03/01/17 0900  Gross per 24 hour  Intake              360 ml  Output                0 ml  Net              360 ml     Wt Readings from Last 3 Encounters:  02/27/17 134.4 kg (296 lb 3.2 oz)  10/10/16 116.1 kg (256 lb)  08/28/16 122.5 kg (270 lb)     Exam  General: Alert and oriented x 3, NAD  HEENT:  PERRLA, EOMI  Neck: Supple, no JVD  Cardiovascular: S1 S2 clear, regular rate and rhythm  Respiratory: Clear to auscultation bilaterally  Gastrointestinal: Soft, nontender, nondistended, normal bowel sounds  Ext: no cyanosis clubbing or edema  Neuro: no focal neurological deficits   Skin: No rashes  Psych: Normal affect and demeanor, alert and oriented x3    Data  Reviewed:  I have personally reviewed following labs and imaging studies  Micro Results Recent Results (from the past 240 hour(s))  MRSA PCR Screening     Status: Abnormal   Collection Time: 02/25/17  5:13 AM  Result Value Ref Range Status   MRSA by PCR POSITIVE (A) NEGATIVE Final    Comment:        The GeneXpert MRSA Assay (FDA approved for NASAL specimens only), is one component of a comprehensive MRSA colonization surveillance program. It is not intended to diagnose MRSA infection nor to guide or monitor treatment for MRSA infections. Results Called to: RN Marylou Mccoy [id number] [place]     Radiology Reports Ct Angio Aortobifemoral W And/or Wo Contrast  Result Date: 02/24/2017 CLINICAL DATA:  Left foot pain ischemia with untreated diabetes. History of DVT. EXAM: CT ANGIOGRAPHY OF ABDOMINAL AORTA WITH ILIOFEMORAL RUNOFF TECHNIQUE: Multidetector CT imaging of the abdomen, pelvis and lower extremities was performed using the standard protocol during bolus administration of intravenous contrast. Multiplanar CT image reconstructions and MIPs were obtained to evaluate the vascular anatomy. CONTRAST:  125 mL Isovue 370 COMPARISON:  Abdominal CT 08/17/2015 FINDINGS: VASCULAR Aorta: Normal caliber of the abdominal aorta without significant atherosclerotic disease and no evidence for dissection. Celiac: Patent without evidence of aneurysm, dissection, vasculitis or significant stenosis. SMA: Patent without evidence of aneurysm, dissection, vasculitis or significant stenosis. Incidentally, there is a replaced right hepatic artery which is a normal anatomic variant. Renals: Both renal arteries are patent without evidence of aneurysm, dissection, vasculitis, fibromuscular dysplasia or significant stenosis. IMA: Proximal IMA is patent. Cannot exclude clot or occlusion in the mid segment on sequence 4, image 103. RIGHT Lower Extremity Inflow: Common, internal and external iliac arteries are patent without  evidence of aneurysm, dissection, vasculitis or significant stenosis. Outflow: Mild atherosclerotic disease in the right common femoral artery without significant stenosis. The proximal deep femoral arteries are patent. Right SFA is patent without significant atherosclerotic disease. Right popliteal artery is patent without significant atherosclerotic disease. Runoff: Three-vessel runoff in the right lower extremity. The distal anterior tibial artery is small. Distal anterior tibial artery may occlude near the ankle. Posterior tibial artery is patent across the ankle. No significant flow in the proximal dorsalis pedis artery. LEFT Lower Extremity Inflow: Irregular thrombus throughout the left common iliac artery and this is suggestive for acute/subacute thrombus rather than chronic thrombus. Proximal left internal iliac artery is occluded and this may represent acute thrombosis. Left external iliac artery is patent. Outflow: Mild atherosclerotic disease in the left common femoral artery without significant stenosis. Short-segment proximal occlusion of the left profunda femoral artery. There is reconstitution of the deep left femoral artery. The left SFA is patent. Left popliteal artery is patent. There are low-density structures adjacent to the anterior aspect of the popliteal artery. There is another low-density structure lateral to the left popliteal artery. Findings could be associated with adventitial cystic disease. There is no significant compression on the left popliteal artery. Runoff: Runoff vessels are patent proximally. The left anterior tibial artery occludes in the mid calf. There is a segmental occlusion in the distal left peroneal artery. Distal occlusion of the posterior tibial artery. These occlusions within the runoff vessels are likely secondary to embolic disease. No significant arterial flow identified in the left foot. Veins: Prominent superficial veins along the medial aspect of the right  thigh. Findings are suggestive for superficial venous insufficiency. Review of the MIP images confirms the above findings. NON-VASCULAR Lower chest: Several small pulmonary nodules, largest along the right major fissure. Largest nodule measures 8 mm on sequence 4, image 9. These large nodules have not significantly changed since 08/25/2014. Indeterminate ground-glass pleural-based nodule along the right lower lobe on sequence 4,  image 7. Suspect this could be related to atelectasis. Hepatobiliary: Normal appearance of the liver and gallbladder. Pancreas: Normal appearance of the pancreas without inflammation or duct dilatation. Spleen: Normal appearance of spleen without enlargement. Adrenals/Urinary Tract: Again noted is a 1.3 cm low-density nodule in the left adrenal gland which is most compatible with an adenoma based on the previous noncontrast examination. Normal appearance of the right adrenal gland. Again noted are multiple bilateral renal stones. However, there is now gas within multiple calices bilaterally. There is extensive cortical scarring throughout the left kidney. No significant edema around either kidney. Negative for hydronephrosis. There is also gas within the urinary bladder. Stomach/Bowel: Small hiatal hernia. Question a lymph node adjacent to the hiatal hernia on sequence 4, image 33. Otherwise, normal appearance of stomach, small bowel and large bowel. Appendix is normal. Lymphatic: Small lymph nodes in the upper abdomen are nonspecific. Reproductive: Uterus and bilateral adnexa are unremarkable. Other: No free fluid.  No free intraperitoneal air. Musculoskeletal: Focal sclerosis in the left pubic bone may represent a bone island. 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 There is occlusion of the proximal left internal iliac artery. There is a short-segment occlusion in the left profunda femoral artery. There  is distal occlusion of the left runoff vessels without significant arterial flow identified in the left foot. No significant atherosclerotic disease in the right lower extremity. There is distal disease in the right anterior tibial artery and within the right dorsalis pedis artery. Embolic disease in this area cannot be excluded. Low-density or cystic structures in left popliteal region near the left popliteal artery. This may represent adventitial cystic disease but there does not appear to be significant compression or narrowing of the left popliteal artery. NON-VASCULAR There is gas within the urinary bladder and bilateral renal calices. It is possible that this gas is iatrogenic and related to severe reflux. However, the patient's recent urinalysis demonstrates many bacteria and the patient has bilateral kidney stones and extensive scarring in the left kidney. Findings are concerning for a urinary tract infection with a gas-forming organism. Again noted are pulmonary nodules. Largest nodules have not significantly changed since 2015. Some of the pulmonary nodules are indeterminate. This could be better characterized with a dedicated chest CT. These results were called by telephone at the time of interpretation on 02/24/2017 at 6:17 pm to Dr. Fredia Sorrow , who verbally acknowledged these results. Electronically Signed   By: Markus Daft M.D.   On: 02/24/2017 18:32    Lab Data:  CBC:  Recent Labs Lab 02/24/17 1557  02/25/17 0553 02/26/17 0315 02/27/17 0306 02/28/17 0247 03/01/17 0207  WBC 17.8*  --  23.5* 15.8* 12.8* 14.4* 13.6*  NEUTROABS 14.5*  --   --  10.0*  --   --   --   HGB 13.6  < > 10.2* 9.2* 8.3* 8.3* 8.2*  HCT 40.8  < > 31.5* 28.7* 26.7* 27.1* 26.6*  MCV 86.1  --  86.3 89.1 88.1 89.1 89.6  PLT 294  --  273 283 298 345 405*  < > = values in this interval not displayed. Basic Metabolic Panel:  Recent Labs Lab 02/24/17 1557  02/24/17 2159 02/25/17 0553 02/26/17 0315  02/27/17 0306 02/28/17 0247  NA 130*  < > 135 134* 130* 131* 132*  K 3.8  < > 4.5 3.6 3.9 4.0 4.3  CL 98*  --   --  105  103 102 103  CO2 18*  --   --  21* 18* 23 22  GLUCOSE 521*  < > 236* 106* 291* 295* 235*  BUN 11  --   --  11 9 10 10   CREATININE 1.47*  --   --  1.23* 1.38* 1.27* 1.46*  CALCIUM 8.7*  --   --  8.0* 8.3* 8.5* 8.9  < > = values in this interval not displayed. GFR: Estimated Creatinine Clearance: 71.9 mL/min (A) (by C-G formula based on SCr of 1.46 mg/dL (H)). Liver Function Tests:  Recent Labs Lab 02/24/17 1557  AST 27  ALT 18  ALKPHOS 91  BILITOT 0.8  PROT 6.3*  ALBUMIN 2.9*   No results for input(s): LIPASE, AMYLASE in the last 168 hours. No results for input(s): AMMONIA in the last 168 hours. Coagulation Profile: No results for input(s): INR, PROTIME in the last 168 hours. Cardiac Enzymes: No results for input(s): CKTOTAL, CKMB, CKMBINDEX, TROPONINI in the last 168 hours. BNP (last 3 results) No results for input(s): PROBNP in the last 8760 hours. HbA1C: No results for input(s): HGBA1C in the last 72 hours. CBG:  Recent Labs Lab 02/28/17 1313 02/28/17 1607 02/28/17 2139 03/01/17 0555 03/01/17 1141  GLUCAP 314* 157* 254* 193* 161*   Lipid Profile: No results for input(s): CHOL, HDL, LDLCALC, TRIG, CHOLHDL, LDLDIRECT in the last 72 hours. Thyroid Function Tests: No results for input(s): TSH, T4TOTAL, FREET4, T3FREE, THYROIDAB in the last 72 hours. Anemia Panel: No results for input(s): VITAMINB12, FOLATE, FERRITIN, TIBC, IRON, RETICCTPCT in the last 72 hours. Urine analysis:    Component Value Date/Time   COLORURINE YELLOW 02/26/2017 2223   APPEARANCEUR HAZY (A) 02/26/2017 2223   LABSPEC 1.007 02/26/2017 2223   PHURINE 6.0 02/26/2017 2223   GLUCOSEU >=500 (A) 02/26/2017 2223   HGBUR SMALL (A) 02/26/2017 2223   BILIRUBINUR NEGATIVE 02/26/2017 [place] 02/26/2017 2223   PROTEINUR NEGATIVE 02/26/2017 2223   UROBILINOGEN  0.2 08/16/2015 2305   NITRITE NEGATIVE 02/26/2017 2223   LEUKOCYTESUR LARGE (A) 02/26/2017 2223     Ripudeep Rai M.D. [facility] 03/01/2017, 12:11 PM  Pager: [phone] Between 7am to 7pm - call Pager - [phone]  After 7pm go to [email or web address] - password [email or web address]  Call night coverage person covering after 7pm

## 2017-03-01 NOTE — Plan of Care (Signed)
Problem: Limited Adherence to Nutrition-Related Recommendations (NB-1.6) Goal: Nutrition education Formal process to instruct or train a patient/client in a skill or to impart knowledge to help patients/clients voluntarily manage or modify food choices and eating behavior to maintain or improve health. Outcome: Completed/Met Date Met: 03/01/17  RD consulted for nutrition education regarding diabetes.   Lab Results  Component Value Date   HGBA1C 14.0 (H) 02/25/2017    RD provided "Carbohydrate Counting for People with Diabetes" handout from the Academy of Nutrition and Dietetics. Discussed different food groups and their effects on blood sugar, emphasizing carbohydrate-containing foods. Provided list of carbohydrates and recommended serving sizes of common foods.  Discussed importance of controlled and consistent carbohydrate intake throughout the day. Provided examples of ways to balance meals/snacks and encouraged intake of high-fiber, whole grain complex carbohydrates. Teach back method used.  Expect fair compliance.  Body mass index is 46.39 kg/m. Pt meets criteria for Obesity Class III based on current BMI.  Current diet order is Carbohydrate Modified/HH, patient is consuming approximately 100% of meals at this time. Labs and medications reviewed. No further nutrition interventions warranted at this time. If additional nutrition issues arise, please re-consult RD.  Arthur Holms, RD, LDN Pager #: [phone] After-Hours Pager #: [phone]

## 2017-03-01 NOTE — Progress Notes (Signed)
Inpatient Diabetes Program Recommendations  AACE/ADA: New Consensus Statement on Inpatient Glycemic Control (2015)  Target Ranges:  Prepandial:   less than 140 mg/dL      Peak postprandial:   less than 180 mg/dL (1-2 hours)      Critically ill patients:  140 - 180 mg/dL   Lab Results  Component Value Date   GLUCAP 161 (H) 03/01/2017   HGBA1C 14.0 (H) 02/25/2017    Review of Glycemic Control Results for Terri Sparks, Terri Sparks (MRN [id number]) as of 03/01/2017 14:10  Ref. Range 02/28/2017 13:13 02/28/2017 16:07 02/28/2017 21:39 03/01/2017 05:55 03/01/2017 11:41  Glucose-Capillary Latest Ref Range: 65 - 99 mg/dL 314 (H) 157 (H) 254 (H) 193 (H) 161 (H)   Diabetes history: DM2  Outpatient Diabetes medications: Metformin (not taking @ home) Current orders for Inpatient glycemic control: Lantus 40 units + Novolog 10 units meal coverage tid if eats 50% + Novolog correction 0-15 units tid  Inpatient Diabetes Program Recommendations:  If Lantus and Novolog not available @ [facility], may consider Novolin 70/30 from [facility] which is approx. $25 per vial. Recommend 70/30 30 units bid (42 units basal + 18 units meal coverage) Spoke with patient @ bedside. Patient states willingness to take insulin. Reviewed with patient Lantus insulin and times to take along with Novolog meal coverage to be taken right before a meal. Gave handout on Lantus insulin and RN Bernadene Person to review insulin pen and insulin injections with syringe with patient. Reviewed hypoglycemia 15;15 rule with patient as well and A1c results with them and explained what an A1C is, basic pathophysiology of DM Type 2, basic home care, basic diabetes diet nutrition principles, importance of checking CBGs and maintaining good CBG control to prevent long-term and short-term complications. Reviewed signs and symptoms of hyperglycemia and hypoglycemia and how to treat hypoglycemia at home. Also reviewed blood sugar goals at home.  RNs to provide ongoing basic  DM education at bedside with this patient. DM videos.  Thank you, Nani Gasser. Scout Gumbs, RN, MSN, CDE  Diabetes Coordinator Inpatient Glycemic Control Team Team Pager [phone] (8am-5pm) 03/01/2017 2:26 PM

## 2017-03-01 NOTE — Progress Notes (Addendum)
[facility] [place]  Subjective  - Left foot pain on/off.   Objective 102/76 100 98.3 F (36.8 C) (Oral) 20 100% No intake or output data in the 24 hours ending 03/01/17 0730  Active range of motion intact and sensation left LE Incision all healing well Heart RRR Lungs non labored breathing  Assessment/Planning: 41 y.o. female is s/p:  1. Aortogram with left lower extremity angiogram 2. Stent of left common iliac artery with 8 x 65mm iCast 3. Left lower extremity thromboembolectomy via left common femoral exposure, left popliteal artery exposure and left posterior tibial exposure  5 Days Post-Op  Cr elevated pending Bmet and CTA abdomin Ambulate Cont heparin until CTA results back  Laurence Slate [facility] 03/01/2017 7:30 AM --  Laboratory Lab Results:  Recent Labs  02/28/17 0247 03/01/17 0207  WBC 14.4* 13.6*  HGB 8.3* 8.2*  HCT 27.1* 26.6*  PLT 345 405*   BMET  Recent Labs  02/27/17 0306 02/28/17 0247  NA 131* 132*  K 4.0 4.3  CL 102 103  CO2 23 22  GLUCOSE 295* 235*  BUN 10 10  CREATININE 1.27* 1.46*  CALCIUM 8.5* 8.9    COAG No results found for: INR, PROTIME No results found for: PTT    I have interviewed patient with PA and agree with assessment and plan above.   Brandon C. Donzetta Matters, MD [facility] [place] Office: [phone] Pager: [phone]

## 2017-03-01 NOTE — Progress Notes (Signed)
[place] for heparin Indication: Iliac artery occlusion  Allergies  Allergen Reactions  . Macrobid [Nitrofurantoin Macrocrystal] Hives, Shortness Of Breath and Rash  . Other Hives, Shortness Of Breath and Rash    NO "-CILLINS"!!!  . Penicillins Shortness Of Breath    Had had cephalosporins without incident  . Sulfa Antibiotics Hives, Shortness Of Breath and Rash    Patient Measurements: Height: 5\' 7"  (170.2 cm) Weight: 296 lb 3.2 oz (134.4 kg) IBW/kg (Calculated) : 61.6 Heparin Dosing Weight: 91.5 kg  Vital Signs: Temp: 98.3 Sparks (36.8 C) (05/29 0500) Temp Source: Oral (05/29 0500) BP: 102/76 (05/29 0500) Pulse Rate: 100 (05/29 0500)  Labs:  Recent Labs  02/27/17 0306  02/27/17 1807 02/28/17 0247 02/28/17 0938 03/01/17 0207  HGB 8.3*  --   --  8.3*  --  8.2*  HCT 26.7*  --   --  27.1*  --  26.6*  PLT 298  --   --  345  --  405*  HEPARINUNFRC 0.21*  < > 0.53  --  0.42 0.31  CREATININE 1.27*  --   --  1.46*  --   --   < > = values in this interval not displayed.  Estimated Creatinine Clearance: 71.9 mL/min (A) (by C-G formula based on SCr of 1.46 mg/dL (H)).  Assessment: Terri Sparks with h/o of DVT not on anticoagulation PTA presented with left lower extremity pain, started on IV heparin. 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  hgb 8.2, plts wnl. Heparin level (0.31) remains therapeutic but with trend down.  Goal of Therapy:  Heparin level 0.3-0.7 units/ml Monitor platelets by anticoagulation protocol: Yes   Plan:  -Increase heparin to 2550 units/hr to maintain heparin goal -Daily heparin level and CBC -Will follow anticoagulation plans  Hildred Laser, Pharm D 03/01/2017 8:52 AM

## 2017-03-02 DIAGNOSIS — M79605 Pain in left leg: Secondary | ICD-10-CM

## 2017-03-02 DIAGNOSIS — I739 Peripheral vascular disease, unspecified: Secondary | ICD-10-CM

## 2017-03-02 DIAGNOSIS — N390 Urinary tract infection, site not specified: Secondary | ICD-10-CM

## 2017-03-02 DIAGNOSIS — E872 Acidosis: Secondary | ICD-10-CM

## 2017-03-02 DIAGNOSIS — E118 Type 2 diabetes mellitus with unspecified complications: Secondary | ICD-10-CM

## 2017-03-02 DIAGNOSIS — K219 Gastro-esophageal reflux disease without esophagitis: Secondary | ICD-10-CM

## 2017-03-02 DIAGNOSIS — Z720 Tobacco use: Secondary | ICD-10-CM

## 2017-03-02 DIAGNOSIS — I998 Other disorder of circulatory system: Secondary | ICD-10-CM

## 2017-03-02 DIAGNOSIS — R739 Hyperglycemia, unspecified: Secondary | ICD-10-CM

## 2017-03-02 LAB — BASIC METABOLIC PANEL
Anion gap: 8 (ref 5–15)
BUN: 13 mg/dL (ref 6–20)
CO2: 26 mmol/L (ref 22–32)
Calcium: 8.7 mg/dL — ABNORMAL LOW (ref 8.9–10.3)
Chloride: 98 mmol/L — ABNORMAL LOW (ref 101–111)
Creatinine, Ser: 1.17 mg/dL — ABNORMAL HIGH (ref 0.44–1.00)
GFR calc Af Amer: >60 mL/min (ref >60)
GFR calc non Af Amer: 57 mL/min — ABNORMAL LOW (ref >60)
Glucose, Bld: 168 mg/dL — ABNORMAL HIGH (ref 65–99)
Potassium: 4.6 mmol/L (ref 3.5–5.1)
Sodium: 132 mmol/L — ABNORMAL LOW (ref 135–145)

## 2017-03-02 LAB — CBC
HCT: 27.1 % — ABNORMAL LOW (ref 36.0–46.0)
Hemoglobin: 8.2 g/dL — ABNORMAL LOW (ref 12.0–15.0)
MCH: 27 pg (ref 26.0–34.0)
MCHC: 30.3 g/dL (ref 30.0–36.0)
MCV: 89.1 fL (ref 78.0–100.0)
Platelets: 454 10*3/uL — ABNORMAL HIGH (ref 150–400)
RBC: 3.04 MIL/uL — ABNORMAL LOW (ref 3.87–5.11)
RDW: 15.5 % (ref 11.5–15.5)
WBC: 14.1 10*3/uL — ABNORMAL HIGH (ref 4.0–10.5)

## 2017-03-02 LAB — GLUCOSE, CAPILLARY
Glucose-Capillary: 141 mg/dL — ABNORMAL HIGH (ref 65–99)
Glucose-Capillary: 216 mg/dL — ABNORMAL HIGH (ref 65–99)
Glucose-Capillary: 144 mg/dL — ABNORMAL HIGH (ref 65–99)
Glucose-Capillary: 208 mg/dL — ABNORMAL HIGH (ref 65–99)

## 2017-03-02 LAB — HEPARIN LEVEL (UNFRACTIONATED): Heparin Unfractionated: 0.51 IU/mL (ref 0.30–0.70)

## 2017-03-02 MED ORDER — CLOPIDOGREL BISULFATE 75 MG PO TABS
300.0000 mg | ORAL_TABLET | Freq: Once | ORAL | Status: AC
  Administered 2017-03-02: 300 mg via ORAL
  Filled 2017-03-02: qty 4

## 2017-03-02 MED ORDER — CLOPIDOGREL BISULFATE 75 MG PO TABS
75.0000 mg | ORAL_TABLET | Freq: Every day | ORAL | Status: DC
  Administered 2017-03-03: 75 mg via ORAL
  Filled 2017-03-02: qty 1

## 2017-03-02 NOTE — Progress Notes (Signed)
PROGRESS NOTE    Terri Sparks  STM:[id number] DOB: [birth year] DOA: 02/24/2017 PCP: Patient, No Pcp Per   Chief Complaint  Patient presents with  . Foot Pain  . Urticaria  . Hyperglycemia     Brief Narrative:  41 year old female with history of venous thromboembolism, diabetes mellitus, drug use, hypercalcemia, anxiety who presented with cold LLE. Vascular surgery primary. [facility] consulted for DM management.  Assessment & Plan   Iliac artery occlusion, left -Vascular surgery primary -Status post left lower extremity thrombectomy -Was on heparin, this was discontinued by surg -Placed on aspirin and plavix  Diabetes mellitus, type II, uncontrolled, cuboid hyperglycemia -Continue Lantus, insulin sliding scale, NovoLog 10 units 3 times a day before meals, CBG monitoring -Hemoglobin A1c 14 -Discussed the importance of insulin and maintaining blood sugar log as well as PCP visits -Patient does not have primary care physician or insurance. -Case management consulted, patient has no appointment with a community wellness Center -diabetes coordinator consulted  Urinary tract infection -Urine culture not sent -Continue ceftriaxone  Vaginitis -Likely secondary to antibiotics -Patient given Diflucan, continue clotrimazole  Chronic kidney disease, stage III -Baseline creatinine 1.2-1.3 -Creatinine peaked to 1.46, currently trending downward- currently 1.17 -In today to monitor BMP  GERD -Continue PPI  Tobacco abuse -Discussed smoking cessation  Leukocytosis -Likely secondary to the above given UTI, vaginitis and recent thrombectomy -Continue to monitor  Normocytic anemia -Hemoglobin 8.2, baseline appears to be 11-12 -Drop possibly secondary to dilutional component versus recent surgery -Continue to monitor CBC, transfuse if hemoglobin drops below 7  DVT Prophylaxis  SCDs (was on heparin)    Code Status: Full  Family Communication: none   Disposition Plan: Admitted,  possible discharge in a few days, per surgery  Consultants [facility]  Procedures  Aortogram with LLE angiogram Stent of Left common iliac artery with 8x59 mm iCast LLE thromboembolectomy via left common femoral exposure, left popliteal artery exposure and left posterior tibial exposure   Antibiotics   Anti-infectives    Start     Dose/Rate Route Frequency Ordered Stop   03/01/17 1230  fluconazole (DIFLUCAN) tablet 150 mg     150 mg Oral  Once 03/01/17 1215 03/01/17 1249   02/26/17 0830  fluconazole (DIFLUCAN) tablet 150 mg     150 mg Oral  Once 02/26/17 0729 02/26/17 0942   02/25/17 1600  cefTRIAXone (ROCEPHIN) 1 g in dextrose 5 % 50 mL IVPB     1 g 100 mL/hr over 30 Minutes Intravenous Every 24 hours 02/25/17 1258 03/01/17 1821   02/25/17 0200  cefUROXime (ZINACEF) 1.5 g in dextrose 5 % 50 mL IVPB     1.5 g 100 mL/hr over 30 Minutes Intravenous Every 12 hours 02/25/17 0100 02/25/17 1447   02/24/17 1915  vancomycin (VANCOCIN) IVPB 1000 mg/200 mL premix     1,000 mg 200 mL/hr over 60 Minutes Intravenous  Once 02/24/17 1914 02/24/17 2017      Subjective:   [facility] seen and examined today.  Feels pain is 10/10.  Just received her pain medicine. Denies chest pain, shortness of breath, abdominal pain, nausea, vomiting, diarrhea, constipation.   Objective:   Vitals:   03/01/17 0500 03/01/17 1423 03/01/17 2224 03/02/17 0601  BP: 102/76 120/62 (!) 103/57 (!) 114/55  Pulse: 100 100 89 85  Resp: 20 20 18 18   Temp: 98.3 F (36.8 C) 98.4 F (36.9 C) 97.8 F (36.6 C) 98.3 F (36.8 C)  TempSrc: Oral Oral Oral Oral  SpO2: 100% 100% 100% 98%  Weight:      Height:        Intake/Output Summary (Last 24 hours) at 03/02/17 1030 Last data filed at 03/02/17 0938  Gross per 24 hour  Intake          1945.98 ml  Output                0 ml  Net          1945.98 ml   Filed Weights   02/24/17 1531 02/27/17 1219  Weight: 125.2 kg (276 lb) 134.4 kg (296 lb 3.2 oz)    Exam  General:  Well developed, well nourished, NAD, appears stated age  HEENT: NCAT, mucous membranes moist.   Cardiovascular: S1 S2 auscultated, no rubs, murmurs or gallops. Regular rate and rhythm.  Respiratory: Clear to auscultation bilaterally with equal chest rise  Abdomen: Soft, obese, nontender, nondistended, + bowel sounds  Extremities: warm dry without cyanosis clubbing or edema  Neuro: AAOx3, nonfocal  Psych: Normal affect and demeanor with intact judgement and insight   Data Reviewed: I have personally reviewed following labs and imaging studies  CBC:  Recent Labs Lab 02/24/17 1557  02/26/17 0315 02/27/17 0306 02/28/17 0247 03/01/17 0207 03/02/17 0221  WBC 17.8*  < > 15.8* 12.8* 14.4* 13.6* 14.1*  NEUTROABS 14.5*  --  10.0*  --   --   --   --   HGB 13.6  < > 9.2* 8.3* 8.3* 8.2* 8.2*  HCT 40.8  < > 28.7* 26.7* 27.1* 26.6* 27.1*  MCV 86.1  < > 89.1 88.1 89.1 89.6 89.1  PLT 294  < > 283 298 345 405* 454*  < > = values in this interval not displayed. Basic Metabolic Panel:  Recent Labs Lab 02/26/17 0315 02/27/17 0306 02/28/17 0247 03/01/17 1302 03/02/17 0221  NA 130* 131* 132* 132* 132*  K 3.9 4.0 4.3 4.9 4.6  CL 103 102 103 100* 98*  CO2 18* 23 22 24 26   GLUCOSE 291* 295* 235* 195* 168*  BUN 9 10 10 14 13   CREATININE 1.38* 1.27* 1.46* 1.05* 1.17*  CALCIUM 8.3* 8.5* 8.9 9.0 8.7*   GFR: Estimated Creatinine Clearance: 89.7 mL/min (A) (by C-G formula based on SCr of 1.17 mg/dL (H)). Liver Function Tests:  Recent Labs Lab 02/24/17 1557  AST 27  ALT 18  ALKPHOS 91  BILITOT 0.8  PROT 6.3*  ALBUMIN 2.9*   No results for input(s): LIPASE, AMYLASE in the last 168 hours. No results for input(s): AMMONIA in the last 168 hours. Coagulation Profile: No results for input(s): INR, PROTIME in the last 168 hours. Cardiac Enzymes: No results for input(s): CKTOTAL, CKMB, CKMBINDEX, TROPONINI in the last 168 hours. BNP (last 3 results) No results for input(s): PROBNP  in the last 8760 hours. HbA1C: No results for input(s): HGBA1C in the last 72 hours. CBG:  Recent Labs Lab 03/01/17 0555 03/01/17 1141 03/01/17 1624 03/01/17 2222 03/02/17 0606  GLUCAP 193* 161* 126* 214* 141*   Lipid Profile: No results for input(s): CHOL, HDL, LDLCALC, TRIG, CHOLHDL, LDLDIRECT in the last 72 hours. Thyroid Function Tests: No results for input(s): TSH, T4TOTAL, FREET4, T3FREE, THYROIDAB in the last 72 hours. Anemia Panel: No results for input(s): VITAMINB12, FOLATE, FERRITIN, TIBC, IRON, RETICCTPCT in the last 72 hours. Urine analysis:    Component Value Date/Time   COLORURINE YELLOW 02/26/2017 2223   APPEARANCEUR HAZY (A) 02/26/2017 2223   LABSPEC 1.007 02/26/2017 2223  PHURINE 6.0 02/26/2017 2223   GLUCOSEU >=500 (A) 02/26/2017 2223   HGBUR SMALL (A) 02/26/2017 2223   BILIRUBINUR NEGATIVE 02/26/2017 2223   KETONESUR NEGATIVE 02/26/2017 2223   PROTEINUR NEGATIVE 02/26/2017 2223   UROBILINOGEN 0.2 08/16/2015 2305   NITRITE NEGATIVE 02/26/2017 2223   LEUKOCYTESUR LARGE (A) 02/26/2017 2223   Sepsis Labs: '@LABRCNTIP'$ (procalcitonin:4,lacticidven:4)  ) Recent Results (from the past 240 hour(s))  MRSA PCR Screening     Status: Abnormal   Collection Time: 02/25/17  5:13 AM  Result Value Ref Range Status   MRSA by PCR POSITIVE (A) NEGATIVE Final    Comment:        The GeneXpert MRSA Assay (FDA approved for NASAL specimens only), is one component of a comprehensive MRSA colonization surveillance program. It is not intended to diagnose MRSA infection nor to guide or monitor treatment for MRSA infections. Results Called to: RN Marylou Mccoy [id number] [phone] [facility]       Radiology Studies: Ct Angio Chest Aorta W/cm &/or Wo/cm  Result Date: 03/01/2017 CLINICAL DATA:  Acute onset of generalized chest pain. Known left common iliac artery thrombus. Initial encounter. EXAM: CT ANGIOGRAPHY CHEST WITH CONTRAST TECHNIQUE: Multidetector CT imaging of the chest was  performed using the standard protocol during bolus administration of intravenous contrast. Multiplanar CT image reconstructions and MIPs were obtained to evaluate the vascular anatomy. CONTRAST:  100 mL of Isovue 370 IV contrast COMPARISON:  CTA of the chest performed 08/25/2014 FINDINGS: Cardiovascular: There is no evidence of central pulmonary embolus. Evaluation for pulmonary embolus is suboptimal due to the phase of contrast enhancement. The heart is borderline enlarged. The thoracic aorta is grossly unremarkable. The great vessels are within normal limits. Mediastinum/Nodes: A mildly enlarged 1.2 cm azygoesophageal recess node is seen. No additional mediastinal lymphadenopathy is appreciated. The visualized right peribronchial nodes are borderline normal in size. No pericardial effusion is identified. The visualized portions of the thyroid gland are unremarkable. No axillary lymphadenopathy is appreciated. Lungs/Pleura: Visualized nodules along the right major fissure, measuring 7 mm and 6 mm, are grossly stable from 2015 and likely benign. These may reflect lymph nodes. Minimal hazy opacities are noted dependently at the left lung, reflecting atelectasis or mild pneumonia. No pleural effusion or pneumothorax is seen. No dominant mass is identified. Upper Abdomen: The visualized portions of the liver and spleen are grossly unremarkable. Musculoskeletal: No acute osseous abnormalities are identified. The visualized musculature is unremarkable in appearance. Review of the MIP images confirms the above findings. IMPRESSION: 1. No evidence of central pulmonary embolus. 2. Minimal hazy opacities at the left lung may reflect atelectasis or mild pneumonia. Would correlate with the patient's symptoms. 3. Stable nodules along the right major fissure, unchanged from 2015, are likely benign and may reflect lymph nodes. 4. Borderline cardiomegaly. 5. Mildly enlarged 1.2 cm azygoesophageal recess node seen. Electronically  Signed   By: Garald Balding M.D.   On: 03/01/2017 22:16     Scheduled Meds: . aspirin EC  81 mg Oral Daily  . carbamide peroxide   dental Daily  . [START ON 03/03/2017] clopidogrel  75 mg Oral Daily  . clotrimazole  1 Applicatorful Vaginal QHS  . docusate sodium  100 mg Oral Daily  . gabapentin  200 mg Oral TID  . insulin aspart  0-15 Units Subcutaneous TID WC  . insulin aspart  10 Units Subcutaneous TID WC  . insulin glargine  40 Units Subcutaneous Daily  . insulin starter kit- pen needles  1 kit Other  Once  . nicotine  21 mg Transdermal Daily  . pantoprazole  40 mg Oral Daily   Continuous Infusions: . sodium chloride    . sodium chloride Stopped (03/02/17 0180)  . magnesium sulfate 1 - 4 g bolus IVPB       LOS: 6 days   Time Spent in minutes   30 minutes  Uzziah Rigg D.O. on 03/02/2017 at 10:30 AM  Between 7am to 7pm - Pager - [phone]  After 7pm go to [email or web address] - password [email or web address]  And look for the night coverage person covering for me after hours  [facility] Office  [phone]

## 2017-03-02 NOTE — Progress Notes (Signed)
Inpatient Diabetes Program Recommendations  AACE/ADA: New Consensus Statement on Inpatient Glycemic Control (2015)  Target Ranges:  Prepandial:   less than 140 mg/dL      Peak postprandial:   less than 180 mg/dL (1-2 hours)      Critically ill patients:  140 - 180 mg/dL   Lab Results  Component Value Date   GLUCAP 141 (H) 03/02/2017   HGBA1C 14.0 (H) 02/25/2017   Inpatient Diabetes Program Recommendations: Spoke with pt @ bedside about A1C results 14 (>352 over the past 2-3 months) and explained what an A1C is, basic pathophysiology of DM Type 2, basic home care, basic diabetes diet nutrition principles, importance of checking CBGs and maintaining good CBG control to prevent long-term and short-term complications. Reviewed signs and symptoms of hyperglycemia and hypoglycemia and how to treat hypoglycemia at home. Also reviewed blood sugar goals at home.  RNs to provide ongoing basic DM education at bedside with this patient.   Thank you, Nani Gasser. Bostyn Bogie, RN, MSN, CDE  Diabetes Coordinator Inpatient Glycemic Control Team Team Pager [phone] (8am-5pm) 03/02/2017 10:28 AM

## 2017-03-02 NOTE — Progress Notes (Signed)
Patient states she has RW in her hospital room to take home.

## 2017-03-02 NOTE — Progress Notes (Addendum)
[facility]  Subjective  - pain increase in the left foot.  Needed IV pain medication.   Objective (!) 114/55 85 98.3 F (36.8 C) (Oral) 18 98%  Intake/Output Summary (Last 24 hours) at 03/02/17 0750 Last data filed at 03/01/17 2226  Gross per 24 hour  Intake             1560 ml  Output                0 ml  Net             1560 ml    Doppler DP/peroneal signals Active range of motion foot and ankle  Groin soft incision healing well Left leg incisions healing well  CTA chest/aorta  IMPRESSION: 1. No evidence of central pulmonary embolus. 2. Minimal hazy opacities at the left lung may reflect atelectasis or mild pneumonia. Would correlate with the patient's symptoms. 3. Stable nodules along the right major fissure, unchanged from 2015, are likely benign and may reflect lymph nodes. 4. Borderline cardiomegaly. 5. Mildly enlarged 1.2 cm azygoesophageal recess node seen.   Assessment/Planning: 41 y.o.femaleis s/p:  1. Aortogram with left lower extremity angiogram 2. Stent of left common iliac artery with 8 x 27mm iCast 3. Left lower extremity thromboembolectomy via left common femoral exposure, left popliteal artery exposure and left posterior tibial exposure 6 Days Post-Op   Will likely D/C heparin today CTA chest without embolism  81 mg aspirin +/- plavix per Dr. Donzetta Matters   Cr decreasing well now 1.17 UTI (urinary tract infection) - Currently on IV Rocephin, urine culture was never sent Vaginitis - Still complaining of some vaginitis, improved some after first dose of Diflucan.  - Repeat Diflucan 150 mg 1 and continue clotrimazole   Patient needs PCP for out patient f/u and glucose control hopefully Internal medicine will help her with this.  Possible discharge Tomorrow or Friday.  Laurence Slate [facility] 03/02/2017 7:50 AM --  Laboratory Lab Results:  Recent Labs  03/01/17 0207 03/02/17 0221  WBC 13.6* 14.1*  HGB  8.2* 8.2*  HCT 26.6* 27.1*  PLT 405* 454*   BMET  Recent Labs  03/01/17 1302 03/02/17 0221  NA 132* 132*  K 4.9 4.6  CL 100* 98*  CO2 24 26  GLUCOSE 195* 168*  BUN 14 13  CREATININE 1.05* 1.17*  CALCIUM 9.0 8.7*    COAG No results found for: INR, PROTIME No results found for: PTT   I have independently interviewed patient and agree with PA assessment and plan above. Heparin discontinued and plavix loaded. dispo planning.   Eean Buss C. Donzetta Matters, MD [facility] [place] Office: [phone] Pager: [phone]

## 2017-03-03 ENCOUNTER — Telehealth: Payer: Self-pay | Admitting: Vascular Surgery

## 2017-03-03 LAB — BASIC METABOLIC PANEL
Anion gap: 8 (ref 5–15)
BUN: 14 mg/dL (ref 6–20)
CO2: 27 mmol/L (ref 22–32)
Calcium: 9.2 mg/dL (ref 8.9–10.3)
Chloride: 97 mmol/L — ABNORMAL LOW (ref 101–111)
Creatinine, Ser: 1.26 mg/dL — ABNORMAL HIGH (ref 0.44–1.00)
GFR calc Af Amer: >60 mL/min (ref >60)
GFR calc non Af Amer: 52 mL/min — ABNORMAL LOW (ref >60)
Glucose, Bld: 240 mg/dL — ABNORMAL HIGH (ref 65–99)
Potassium: 4.7 mmol/L (ref 3.5–5.1)
Sodium: 132 mmol/L — ABNORMAL LOW (ref 135–145)

## 2017-03-03 LAB — GLUCOSE, CAPILLARY
Glucose-Capillary: 212 mg/dL — ABNORMAL HIGH (ref 65–99)
Glucose-Capillary: 199 mg/dL — ABNORMAL HIGH (ref 65–99)

## 2017-03-03 MED ORDER — INSULIN PEN NEEDLE 31G X 5 MM MISC: 0 refills | Status: DC

## 2017-03-03 MED ORDER — GABAPENTIN 100 MG PO CAPS: 200.0000 mg | ORAL_CAPSULE | Freq: Three times a day (TID) | ORAL | 3 refills | Status: DC

## 2017-03-03 MED ORDER — OXYCODONE-ACETAMINOPHEN 5-325 MG PO TABS: 1.0000 | ORAL_TABLET | ORAL | 0 refills | Status: DC | PRN

## 2017-03-03 MED ORDER — NYSTATIN 100000 UNIT/ML MT SUSP: 5.0000 mL | Freq: Four times a day (QID) | OROMUCOSAL | 0 refills | Status: DC

## 2017-03-03 MED ORDER — CLONAZEPAM 0.5 MG PO TABS: 0.5000 mg | ORAL_TABLET | Freq: Three times a day (TID) | ORAL | 0 refills | Status: DC | PRN

## 2017-03-03 MED ORDER — BLOOD GLUCOSE METER KIT: PACK | 0 refills | Status: DC

## 2017-03-03 MED ORDER — INSULIN ASPART 100 UNIT/ML ~~LOC~~ SOLN: SUBCUTANEOUS | 1 refills | Status: DC

## 2017-03-03 MED ORDER — INSULIN GLARGINE 100 UNIT/ML ~~LOC~~ SOLN: 40.0000 [IU] | Freq: Every day | SUBCUTANEOUS | 11 refills | Status: DC

## 2017-03-03 MED ORDER — NYSTATIN 100000 UNIT/ML MT SUSP
5.0000 mL | Freq: Four times a day (QID) | OROMUCOSAL | Status: DC
  Administered 2017-03-03: 500000 [IU] via ORAL
  Filled 2017-03-03: qty 5

## 2017-03-03 MED ORDER — INSULIN STARTER KIT- PEN NEEDLES (ENGLISH): 1.0000 | Freq: Once | 0 refills | Status: AC

## 2017-03-03 MED ORDER — INSULIN ASPART 100 UNIT/ML ~~LOC~~ SOLN: 10.0000 [IU] | Freq: Three times a day (TID) | SUBCUTANEOUS | 11 refills | Status: DC

## 2017-03-03 MED ORDER — NICOTINE 21 MG/24HR TD PT24: 21.0000 mg | MEDICATED_PATCH | Freq: Every day | TRANSDERMAL | 0 refills | Status: DC

## 2017-03-03 MED ORDER — CLOPIDOGREL BISULFATE 75 MG PO TABS: 75.0000 mg | ORAL_TABLET | Freq: Every day | ORAL | 3 refills | Status: DC

## 2017-03-03 NOTE — Progress Notes (Signed)
    Called to exam left ankle incision. No erythema or edema, moist incision without purulent drainage. Dry guaze to ankle  COLLINS, EMMA MAUREEN PA-C

## 2017-03-03 NOTE — Progress Notes (Addendum)
[facility] [place]  Subjective  - Ready to go home.   Objective 123/69 91 97.9 F (36.6 C) (Axillary) 18 100%  Intake/Output Summary (Last 24 hours) at 03/03/17 3567 Last data filed at 03/02/17 2010  Gross per 24 hour  Intake          2153.25 ml  Output                0 ml  Net          2153.25 ml    Doppler peroneal signal left Foot warm to touch Incisions healing well, groin without hematoma Herat RRR Lungs non labored breathing  Assessment/Planning: 41 y.o.femaleis s/p:  1. Aortogram with left lower extremity angiogram 2. Stent of left common iliac artery with 8 x 43mm iCast 3. Left lower extremity thromboembolectomy via left common femoral exposure, left popliteal artery exposure and left posterior tibial exposure 7Days Post-Op   Discharge on Klonopin 0.5 TID PRN for anxiety #40 Plavix 75 QD Oxycodone 5/325 #30 Novolog 10 units TID with meals Lantus 40 QD Nicotine 21 mg patches QD Gabapentin 200 mg TID  F/U with Dr. Donzetta Matters in 4 weeks with ABI's B PCP f/u per Internal medicine thank you for your help  Laurence Slate [facility] 03/03/2017 7:33 AM --  Laboratory Lab Results:  Recent Labs  03/01/17 0207 03/02/17 0221  WBC 13.6* 14.1*  HGB 8.2* 8.2*  HCT 26.6* 27.1*  PLT 405* 454*   BMET  Recent Labs  03/02/17 0221 03/03/17 0213  NA 132* 132*  K 4.6 4.7  CL 98* 97*  CO2 26 27  GLUCOSE 168* 240*  BUN 13 14  CREATININE 1.17* 1.26*  CALCIUM 8.7* 9.2    COAG No results found for: INR, PROTIME No results found for: PTT    I have interviewed patient with PA and agree with assessment and plan above.   Brandon C. Donzetta Matters, MD [facility] [place] Office: [phone] Pager: [phone]

## 2017-03-03 NOTE — Progress Notes (Addendum)
PROGRESS NOTE    Terri Sparks  WYO:[id number] DOB: 03/23/1976 DOA: 02/24/2017 PCP: Patient, No Pcp Per   Chief Complaint  Patient presents with  . Foot Pain  . Urticaria  . Hyperglycemia     Brief Narrative:  41 year old female with history of venous thromboembolism, diabetes mellitus, drug use, hypercalcemia, anxiety who presented with cold LLE. Vascular surgery primary. [facility] consulted for DM management.  Assessment & Plan   Iliac artery occlusion, left -Vascular surgery primary -Status post left lower extremity thrombectomy -Was on heparin, this was discontinued by surg -Placed on aspirin and plavix  Diabetes mellitus, type II, uncontrolled, cuboid hyperglycemia -Continue Lantus, insulin sliding scale, NovoLog 10 units 3 times a day before meals, CBG monitoring -Hemoglobin A1c 14 -Discussed the importance of insulin and maintaining blood sugar log as well as PCP visits -Patient does not have primary care physician or insurance. -Case management consulted, patient has an appointment with a [facility] -diabetes coordinator consulted -Scripts given Lantus, novolog premeal and sliding scale. Explained how to use and when to use sliding scale.   Urinary tract infection -Urine culture not sent -Completed antibiotics during hospitalization  Vaginitis -Likely secondary to antibiotics -Patient given Diflucan, continue clotrimazole  Chronic kidney disease, stage III -Baseline creatinine 1.2-1.3 -Creatinine peaked to 1.46, currently trending downward- currently 1.26  GERD -Continue PPI  Tobacco abuse -Discussed smoking cessation  Leukocytosis -Likely secondary to the above given UTI, vaginitis and recent thrombectomy -Continue to monitor  Normocytic anemia -Hemoglobin 8.2, baseline appears to be 11-12 -Drop possibly secondary to dilutional component versus recent surgery -Continue to monitor CBC, transfuse if hemoglobin drops below 7  Tongue  soreness -Small abrasion/ulcer on side of tongue, small white areas on tip of tongue -?oral thrush. Patient was on antibiotics this admission and treated with diflucan -Will obtain HIV, can be followed by PCP. -Ordered nystatin solution.   DVT Prophylaxis  SCDs (was on heparin)    Code Status: Full  Family Communication: none   Disposition Plan: Admitted, possible discharge today by vascular surgery  Consultants TR[facility]  Procedures  Aortogram with LLE angiogram Stent of Left common iliac artery with 8x59 mm iCast LLE thromboembolectomy via left common femoral exposure, left popliteal artery exposure and left posterior tibial exposure   Antibiotics   Anti-infectives    Start     Dose/Rate Route Frequency Ordered Stop   03/01/17 1230  fluconazole (DIFLUCAN) tablet 150 mg     150 mg Oral  Once 03/01/17 1215 03/01/17 1249   02/26/17 0830  fluconazole (DIFLUCAN) tablet 150 mg     150 mg Oral  Once 02/26/17 0729 02/26/17 0942   02/25/17 1600  cefTRIAXone (ROCEPHIN) 1 g in dextrose 5 % 50 mL IVPB     1 g 100 mL/hr over 30 Minutes Intravenous Every 24 hours 02/25/17 1258 03/01/17 1821   02/25/17 0200  cefUROXime (ZINACEF) 1.5 g in dextrose 5 % 50 mL IVPB     1.5 g 100 mL/hr over 30 Minutes Intravenous Every 12 hours 02/25/17 0100 02/25/17 1447   02/24/17 1915  vancomycin (VANCOCIN) IVPB 1000 mg/200 mL premix     1,000 mg 200 mL/hr over 60 Minutes Intravenous  Once 02/24/17 1914 02/24/17 2017      Subjective:   [facility] seen and examined today.  Continues to have pain. Worried about affording insulin. Feels comfortable using the insulin. Currently denies chest pain, shortness of breath, abdominal pain, N/V.  Objective:   Vitals:  03/02/17 0601 03/02/17 1300 03/02/17 2010 03/03/17 0426  BP: (!) 114/55 110/60 126/62 123/69  Pulse: 85 88 (!) 101 91  Resp: 18 20 18 18   Temp: 98.3 F (36.8 C) 98.1 F (36.7 C) 98.5 F (36.9 C) 97.9 F (36.6 C)  TempSrc: Oral Oral Oral  Axillary  SpO2: 98% 99% 100% 100%  Weight:      Height:        Intake/Output Summary (Last 24 hours) at 03/03/17 1039 Last data filed at 03/02/17 2010  Gross per 24 hour  Intake             1540 ml  Output                0 ml  Net             1540 ml   Filed Weights   02/24/17 1531 02/27/17 1219  Weight: 125.2 kg (276 lb) 134.4 kg (296 lb 3.2 oz)   Exam  General: Well developed, well nourished, NAD, appears stated age  HEENT: NCAT, mucous membranes moist.   Extremities: warm dry without cyanosis clubbing or edema  Neuro: AAOx3, nonfocal (falls asleep easily)   Psych: Appropriate  Data Reviewed: I have personally reviewed following labs and imaging studies  CBC:  Recent Labs Lab 02/24/17 1557  02/26/17 0315 02/27/17 0306 02/28/17 0247 03/01/17 0207 03/02/17 0221  WBC 17.8*  < > 15.8* 12.8* 14.4* 13.6* 14.1*  NEUTROABS 14.5*  --  10.0*  --   --   --   --   HGB 13.6  < > 9.2* 8.3* 8.3* 8.2* 8.2*  HCT 40.8  < > 28.7* 26.7* 27.1* 26.6* 27.1*  MCV 86.1  < > 89.1 88.1 89.1 89.6 89.1  PLT 294  < > 283 298 345 405* 454*  < > = values in this interval not displayed. Basic Metabolic Panel:  Recent Labs Lab 02/27/17 0306 02/28/17 0247 03/01/17 1302 03/02/17 0221 03/03/17 0213  NA 131* 132* 132* 132* 132*  K 4.0 4.3 4.9 4.6 4.7  CL 102 103 100* 98* 97*  CO2 23 22 24 26 27   GLUCOSE 295* 235* 195* 168* 240*  BUN 10 10 14 13 14   CREATININE 1.27* 1.46* 1.05* 1.17* 1.26*  CALCIUM 8.5* 8.9 9.0 8.7* 9.2   GFR: Estimated Creatinine Clearance: 83.3 mL/min (A) (by C-G formula based on SCr of 1.26 mg/dL (H)). Liver Function Tests:  Recent Labs Lab 02/24/17 1557  AST 27  ALT 18  ALKPHOS 91  BILITOT 0.8  PROT 6.3*  ALBUMIN 2.9*   No results for input(s): LIPASE, AMYLASE in the last 168 hours. No results for input(s): AMMONIA in the last 168 hours. Coagulation Profile: No results for input(s): INR, PROTIME in the last 168 hours. Cardiac Enzymes: No results  for input(s): CKTOTAL, CKMB, CKMBINDEX, TROPONINI in the last 168 hours. BNP (last 3 results) No results for input(s): PROBNP in the last 8760 hours. HbA1C: No results for input(s): HGBA1C in the last 72 hours. CBG:  Recent Labs Lab 03/02/17 0606 03/02/17 1141 03/02/17 1703 03/02/17 2120 03/03/17 0615  GLUCAP 141* 216* 144* 208* 212*   Lipid Profile: No results for input(s): CHOL, HDL, LDLCALC, TRIG, CHOLHDL, LDLDIRECT in the last 72 hours. Thyroid Function Tests: No results for input(s): TSH, T4TOTAL, FREET4, T3FREE, THYROIDAB in the last 72 hours. Anemia Panel: No results for input(s): VITAMINB12, FOLATE, FERRITIN, TIBC, IRON, RETICCTPCT in the last 72 hours. Urine analysis:    Component Value Date/Time  COLORURINE YELLOW 02/26/2017 2223   APPEARANCEUR HAZY (A) 02/26/2017 2223   LABSPEC 1.007 02/26/2017 2223   PHURINE 6.0 02/26/2017 2223   GLUCOSEU >=500 (A) 02/26/2017 2223   HGBUR SMALL (A) 02/26/2017 2223   BILIRUBINUR NEGATIVE 02/26/2017 2223   KETONESUR NEGATIVE 02/26/2017 2223   PROTEINUR NEGATIVE 02/26/2017 2223   UROBILINOGEN 0.2 08/16/2015 2305   NITRITE NEGATIVE 02/26/2017 2223   LEUKOCYTESUR LARGE (A) 02/26/2017 2223   Sepsis Labs: @LABRCNTIP (procalcitonin:4,lacticidven:4)  ) Recent Results (from the past 240 hour(s))  MRSA PCR Screening     Status: Abnormal   Collection Time: 02/25/17  5:13 AM  Result Value Ref Range Status   MRSA by PCR POSITIVE (A) NEGATIVE Final    Comment:        The GeneXpert MRSA Assay (FDA approved for NASAL specimens only), is one component of a comprehensive MRSA colonization surveillance program. It is not intended to diagnose MRSA infection nor to guide or monitor treatment for MRSA infections. Results Called to: RN Marylou Mccoy [id number] [phone] [facility]       Radiology Studies: Ct Angio Chest Aorta W/cm &/or Wo/cm  Result Date: 03/01/2017 CLINICAL DATA:  Acute onset of generalized chest pain. Known left common iliac  artery thrombus. Initial encounter. EXAM: CT ANGIOGRAPHY CHEST WITH CONTRAST TECHNIQUE: Multidetector CT imaging of the chest was performed using the standard protocol during bolus administration of intravenous contrast. Multiplanar CT image reconstructions and MIPs were obtained to evaluate the vascular anatomy. CONTRAST:  100 mL of Isovue 370 IV contrast COMPARISON:  CTA of the chest performed 08/25/2014 FINDINGS: Cardiovascular: There is no evidence of central pulmonary embolus. Evaluation for pulmonary embolus is suboptimal due to the phase of contrast enhancement. The heart is borderline enlarged. The thoracic aorta is grossly unremarkable. The great vessels are within normal limits. Mediastinum/Nodes: A mildly enlarged 1.2 cm azygoesophageal recess node is seen. No additional mediastinal lymphadenopathy is appreciated. The visualized right peribronchial nodes are borderline normal in size. No pericardial effusion is identified. The visualized portions of the thyroid gland are unremarkable. No axillary lymphadenopathy is appreciated. Lungs/Pleura: Visualized nodules along the right major fissure, measuring 7 mm and 6 mm, are grossly stable from 2015 and likely benign. These may reflect lymph nodes. Minimal hazy opacities are noted dependently at the left lung, reflecting atelectasis or mild pneumonia. No pleural effusion or pneumothorax is seen. No dominant mass is identified. Upper Abdomen: The visualized portions of the liver and spleen are grossly unremarkable. Musculoskeletal: No acute osseous abnormalities are identified. The visualized musculature is unremarkable in appearance. Review of the MIP images confirms the above findings. IMPRESSION: 1. No evidence of central pulmonary embolus. 2. Minimal hazy opacities at the left lung may reflect atelectasis or mild pneumonia. Would correlate with the patient's symptoms. 3. Stable nodules along the right major fissure, unchanged from 2015, are likely benign and  may reflect lymph nodes. 4. Borderline cardiomegaly. 5. Mildly enlarged 1.2 cm azygoesophageal recess node seen. Electronically Signed   By: Garald Balding M.D.   On: 03/01/2017 22:16     Scheduled Meds: . aspirin EC  81 mg Oral Daily  . carbamide peroxide   dental Daily  . clopidogrel  75 mg Oral Daily  . clotrimazole  1 Applicatorful Vaginal QHS  . docusate sodium  100 mg Oral Daily  . gabapentin  200 mg Oral TID  . insulin aspart  0-15 Units Subcutaneous TID WC  . insulin aspart  10 Units Subcutaneous TID WC  . insulin glargine  40 Units Subcutaneous Daily  . nicotine  21 mg Transdermal Daily  . pantoprazole  40 mg Oral Daily   Continuous Infusions: . sodium chloride    . magnesium sulfate 1 - 4 g bolus IVPB       LOS: 7 days   Time Spent in minutes   30 minutes  Emine Lopata D.O. on 03/03/2017 at 10:39 AM  Between 7am to 7pm - Pager - [phone]  After 7pm go to [email or web address] - password [email or web address]  And look for the night coverage person covering for me after hours  [facility] Office  [phone]

## 2017-03-03 NOTE — Telephone Encounter (Signed)
-----   Message from Mena Goes, RN sent at 03/02/2017  2:19 PM EDT ----- Regarding: 2-3 weeks w/ ABIs   ----- Message ----- From: Ulyses Amor, PA-C Sent: 03/02/2017   1:51 PM To: [facility] Charge Pool  F/U with Dr. Donzetta Matters s/p left ischemic foot f/u in 2-3 weeks ABI's B

## 2017-03-03 NOTE — Telephone Encounter (Signed)
Sched lab 03/22/17 at 9:00 and MD 03/23/17 at 12:45. Pt's ph# would not go through, lm on son's # for confirmation of appt and updated contact info.

## 2017-03-03 NOTE — Progress Notes (Signed)
Discharge instructions printed and reviewed with patient and friend, and copy given for them to take home. All questions addressed at this time. New prescriptions reviewed and all paper scripts sent with patient to fill after discharge. IV's and tele box removed and left ankle bandage changed. Room searched for patient belongings, and confirmed with patient that all valuables were accounted for. Patient to discharge at 1430, one hour after narcotic pain medication given.

## 2017-03-04 ENCOUNTER — Other Ambulatory Visit: Payer: Self-pay | Admitting: *Deleted

## 2017-03-04 LAB — HIV ANTIBODY (ROUTINE TESTING W REFLEX): HIV Screen 4th Generation wRfx: NONREACTIVE

## 2017-03-07 NOTE — Addendum Note (Signed)
Addendum  created 03/07/17 1603 by Oleta Mouse, MD   Sign clinical note

## 2017-03-08 NOTE — Discharge Summary (Signed)
[facility] Discharge Summary   Patient ID:  Terri Sparks MRN: [id number] DOB/AGE: 1975/11/25 41 y.o.  Admit date: 02/24/2017 Discharge date: 03/03/2017 Date of Surgery: 02/24/2017 Surgeon: Surgeon(s): Waynetta Sandy, MD  Admission Diagnosis: Lactic acidosis [E87.2] Hyperglycemia [R73.9] Left leg pain [M79.605] Lower limb ischemia [I99.8]  Discharge Diagnoses:  Lactic acidosis [E87.2] Hyperglycemia [R73.9] Left leg pain [M79.605] Lower limb ischemia [I99.8]  Secondary Diagnoses: Past Medical History:  Diagnosis Date  . Anxiety   . Diabetes ([place])   . GERD (gastroesophageal reflux disease)   . History of blood clots   . Hypercalcemia   . Hyperparathyroidism   . Nephrolithiasis   . Renal calculi     Procedure(s): Left Lower Extremity Embolectomy and Angiogram. Aortagram, Left lower extremity Run-off INSERTION OF Common ILIAC STENT  Discharged Condition: stable  HPI: This is a 41 y.o. female with history of diabetes and drug abuse presents with history of painful, cold, pale left foot and ankle since 1pm today. She has never had this before. Onset was abrupt. significant relief since heparin started. Associated rash on left leg that she attributes to recent drug abuse and has tried amoxicillin without resolution. Left foot now feels like pins and needles. Has remote history of dvt for which she took coumadin for designated amount of time. She is a current every day smoker.  CT angio reviewed with clot in left common iliac artery and left profunda. Tibial vessels on left difficult to evaluate   ASSESSMENT/PLAN: This is a 41 y.o. female with rutherford 2a ischemia and acute appearing clot in left common iliac artery and left profunda artery. Will need embolectomy with possible patch angioplasty of common femoral artery and possible retrograde left iliac artery stenting. I have discussed the risks and benefits including possibility of amputation  of her leg during this admission and significant risk of wound healing complications given size and poor blood glucose control. She demonstrates good understanding.   Hospital Course:  Terri Sparks is a 41 y.o. female is S/P Left Procedure(s): Left Lower Extremity Embolectomy and Angiogram. Aortagram, Left lower extremity Run-off INSERTION OF Common ILIAC STENT  Consults:  Treatment Team:  Waynetta Sandy, MD   Hospitalitis consult secondary to uncontrolled DM and medical management.  Heparin dose for anticoagulation Echo WNL CTA of chest once Cr decreased shows no abnormality 2D Echocardiogram 02/27/17: Study Conclusions  - Left ventricle: The cavity size was normal. Wall thickness was increased in a pattern of mild LVH. Systolic function was normal. The estimated ejection fraction was in the range of 55% to 60%. Wall motion was normal; there were no regional wall motion abnormalities. Left ventricular diastolic function parameters were normal.  Impressions:  - Normal LV systolic and diastolic function; mild LVH.  CTA chest/aorta  IMPRESSION: 1. No evidence of central pulmonary embolus. 2. Minimal hazy opacities at the left lung may reflect atelectasis or mild pneumonia. Would correlate with the patient's symptoms. 3. Stable nodules along the right major fissure, unchanged from 2015, are likely benign and may reflect lymph nodes. 4. Borderline cardiomegaly. 5. Mildly enlarged 1.2 cm azygoesophageal recess node seen.   Cr decreasing well now 1.17 UTI (urinary tract infection) - Currently on IV Rocephin, urine culture was never sent Vaginitis - Still complaining of some vaginitis, improved some after first dose of Diflucan.  - Repeat Diflucan 150 mg 1 andcontinueclotrimazole   Discharge on Klonopin 0.5 TID PRN for anxiety #40 Plavix 75 QD Oxycodone 5/325 #30 Novolog 10  units TID with meals Lantus 40 QD Nicotine 21 mg patches  QD Gabapentin 200 mg TID  F/U with Dr. Donzetta Matters in 4 weeks with ABI's B PCP f/u per Internal medicine thank you for your help  Significant Diagnostic Studies: CBC Lab Results  Component Value Date   WBC 14.1 (H) 03/02/2017   HGB 8.2 (L) 03/02/2017   HCT 27.1 (L) 03/02/2017   MCV 89.1 03/02/2017   PLT 454 (H) 03/02/2017    BMET    Component Value Date/Time   NA 132 (L) 03/03/2017 0213   K 4.7 03/03/2017 0213   CL 97 (L) 03/03/2017 0213   CO2 27 03/03/2017 0213   GLUCOSE 240 (H) 03/03/2017 0213   BUN 14 03/03/2017 0213   CREATININE 1.26 (H) 03/03/2017 0213   CALCIUM 9.2 03/03/2017 0213   GFRNONAA 52 (L) 03/03/2017 0213   GFRAA >60 03/03/2017 0213   COAG No results found for: INR, PROTIME   Disposition:  Discharge to :Home Discharge Instructions    Call MD for:  redness, tenderness, or signs of infection (pain, swelling, bleeding, redness, odor or green/yellow discharge around incision site)    Complete by:  As directed    Call MD for:  severe or increased pain, loss or decreased feeling  in affected limb(s)    Complete by:  As directed    Call MD for:  temperature >100.5    Complete by:  As directed    Discharge instructions    Complete by:  As directed    You may shower daily   Driving Restrictions    Complete by:  As directed    No driving for 1 week   Lifting restrictions    Complete by:  As directed    No heavy lifting for 3 weeks   Resume previous diet    Complete by:  As directed      Allergies as of 03/03/2017      Reactions   Macrobid [nitrofurantoin Macrocrystal] Hives, Shortness Of Breath, Rash   Other Hives, Shortness Of Breath, Rash   NO "-CILLINS"!!!   Penicillins Shortness Of Breath   Had had cephalosporins without incident   Sulfa Antibiotics Hives, Shortness Of Breath, Rash      Medication List    STOP taking these medications   cephALEXin 500 MG capsule Commonly known as:  Arnold these medications   blood glucose meter kit  and supplies Dispense based on patient and insurance preference. Use up to four times daily as directed. (FOR ICD-9 250.00, 250.01).   clonazePAM 0.5 MG tablet Commonly known as:  KLONOPIN Take 1 tablet (0.5 mg total) by mouth 3 (three) times daily as needed (anxiety).   clopidogrel 75 MG tablet Commonly known as:  PLAVIX Take 1 tablet (75 mg total) by mouth daily.   clotrimazole 1 % cream Commonly known as:  LOTRIMIN Apply to affected area 2 times daily   gabapentin 100 MG capsule Commonly known as:  NEURONTIN Take 1 capsule (100 mg total) by mouth 2 (two) times daily. What changed:  Another medication with the same name was added. Make sure you understand how and when to take each. Notes to patient:  DO NOT TAKE THIS DOSAGE, TAKE 200MG THREE TIMES DAILY   gabapentin 100 MG capsule Commonly known as:  NEURONTIN Take 2 capsules (200 mg total) by mouth 3 (three) times daily. What changed:  You were already taking a medication with the same name, and this  prescription was added. Make sure you understand how and when to take each.   glucose blood test strip Use as instructed   ibuprofen 200 MG tablet Commonly known as:  ADVIL,MOTRIN Take 400 mg by mouth every 6 (six) hours as needed (for pain or headaches).   insulin aspart 100 UNIT/ML injection Commonly known as:  novoLOG Inject 10 Units into the skin 3 (three) times daily with meals.   insulin aspart 100 UNIT/ML injection Commonly known as:  novoLOG Use scale as needed. CBG 70-120: 0 units, CBG 121-150: 2 units, CBG 151 - 200: 3 units, CBG 201-250: 5 units, CBG 251-300: 8 units, CBG 301-350: 11 units, CBG 351-400: 15 units   insulin glargine 100 UNIT/ML injection Commonly known as:  LANTUS Inject 0.4 mLs (40 Units total) into the skin daily.   Insulin Pen Needle 31G X 5 MM Misc Use with Lantus and Novolog injections.   metFORMIN 500 MG tablet Commonly known as:  GLUCOPHAGE Take 1 tablet (500 mg total) by mouth 2 (two)  times daily with a meal.   methocarbamol 750 MG tablet Commonly known as:  ROBAXIN-750 Take 1 tablet (750 mg total) by mouth 4 (four) times daily.   nicotine 21 mg/24hr patch Commonly known as:  NICODERM CQ - dosed in mg/24 hours Place 1 patch (21 mg total) onto the skin daily.   nystatin 100000 UNIT/ML suspension Commonly known as:  MYCOSTATIN Take 5 mLs (500,000 Units total) by mouth 4 (four) times daily.   ondansetron 4 MG tablet Commonly known as:  ZOFRAN Take 1 tablet (4 mg total) by mouth every 6 (six) hours.   ONE-A-DAY WOMENS FORMULA Tabs Take 1 tablet by mouth daily.   oxyCODONE-acetaminophen 5-325 MG tablet Commonly known as:  PERCOCET/ROXICET Take 1-2 tablets by mouth every 4 (four) hours as needed for moderate pain.   predniSONE 20 MG tablet Commonly known as:  DELTASONE Take 2 tablets (40 mg total) by mouth daily.   promethazine 25 MG tablet Commonly known as:  PHENERGAN Take 1 tablet (25 mg total) by mouth every 6 (six) hours as needed for nausea or vomiting.     ASK your doctor about these medications   insulin starter kit- pen needles Misc 1 kit by Other route once. Ask about: Should I take this medication?      Verbal and written Discharge instructions given to the patient. Wound care per Discharge AVS Follow-up [facility] on 03/21/2017.   Why:  10 am for hospital follow up  Contact information: [address] [place] [address] [facility].   Specialty:  Rehabilitation Why:  they will call you to schedule time , if you do not hear from them , please give them a call.  Contact information: [address] [place] [id number] [place] [phone]       Waynetta Sandy, MD Follow up in 4 week(s).   Specialties:  Vascular Surgery, Cardiology Why:  office  will call for appt. Contact information: [address] [place] [address] [phone]           Signed: Laurence [facility] 03/08/2017, 8:37 AM

## 2017-03-14 ENCOUNTER — Encounter: Payer: Self-pay | Admitting: Family

## 2017-03-14 ENCOUNTER — Encounter: Payer: Self-pay | Admitting: Vascular Surgery

## 2017-03-14 ENCOUNTER — Telehealth: Payer: Self-pay | Admitting: *Deleted

## 2017-03-14 ENCOUNTER — Ambulatory Visit: Payer: Medicaid Other | Admitting: Family

## 2017-03-14 NOTE — Telephone Encounter (Signed)
Patient did not show up for her appt this afternoon to evaluate her LLE pain. I tried to contact her but had to leave a message.

## 2017-03-14 NOTE — Telephone Encounter (Signed)
Patient called our answering service twice over the past weekend stating the she was having severe leg pain. Dr. Trula Slade said he spoke with her and told her to call the office on Monday to get an appt. She is s/p Aortogram with left lower extremity angiogram 2.  Stent of left common iliac artery with 8 x 82mm iCast 3.  Left lower extremity thromboembolectomy via left common femoral exposure, left popliteal artery exposure and left posterior tibial exposure  by Dr. Donzetta Matters on 02-24-17.   She called again this morning stating that she was having more pain in her LLE. She reports no falls or injury and has been afebrile. I told her to come to our office at 3:00 pm to see our NP for evaluation since Dr. Donzetta Matters would not be in the office until Friday. She voiced understanding and agreement with this plan.

## 2017-03-15 ENCOUNTER — Telehealth: Payer: Self-pay | Admitting: *Deleted

## 2017-03-15 ENCOUNTER — Encounter: Payer: Self-pay | Admitting: Family

## 2017-03-15 NOTE — Telephone Encounter (Signed)
Patient called stating that she was having worsening left leg pain, rating a 10.  She was scheduled yesterday, however her transportation did not arrive.  She also states that she is having drainage from her left groin.  Patient denies fever,chills or coldness or discoloration of her left leg or foot.  An appointment was scheduled with the NP at 2:15 tomorrow 03/16/2017.  Patient was instructed to go to the ED if her symptoms should worsen during the night.  Terri Sparks expressed understanding of the instructions.

## 2017-03-16 ENCOUNTER — Encounter: Payer: Self-pay | Admitting: Family

## 2017-03-16 ENCOUNTER — Ambulatory Visit (INDEPENDENT_AMBULATORY_CARE_PROVIDER_SITE_OTHER): Payer: Self-pay | Admitting: Family

## 2017-03-16 VITALS — BP 124/84 | HR 100 | Temp 98.5°F | Resp 18 | Ht 67.0 in | Wt 296.0 lb

## 2017-03-16 DIAGNOSIS — E1165 Type 2 diabetes mellitus with hyperglycemia: Secondary | ICD-10-CM

## 2017-03-16 DIAGNOSIS — I779 Disorder of arteries and arterioles, unspecified: Secondary | ICD-10-CM

## 2017-03-16 DIAGNOSIS — E1151 Type 2 diabetes mellitus with diabetic peripheral angiopathy without gangrene: Secondary | ICD-10-CM

## 2017-03-16 DIAGNOSIS — T814XXA Infection following a procedure, initial encounter: Secondary | ICD-10-CM

## 2017-03-16 DIAGNOSIS — Z87891 Personal history of nicotine dependence: Secondary | ICD-10-CM

## 2017-03-16 DIAGNOSIS — Z95828 Presence of other vascular implants and grafts: Secondary | ICD-10-CM

## 2017-03-16 DIAGNOSIS — IMO0001 Reserved for inherently not codable concepts without codable children: Secondary | ICD-10-CM

## 2017-03-16 DIAGNOSIS — IMO0002 Reserved for concepts with insufficient information to code with codable children: Secondary | ICD-10-CM

## 2017-03-16 MED ORDER — CIPROFLOXACIN HCL 500 MG PO TABS: 500.0000 mg | ORAL_TABLET | Freq: Two times a day (BID) | ORAL | 0 refills | Status: DC

## 2017-03-16 NOTE — Progress Notes (Signed)
Postoperative Visit CC: left groin drainage, left lower leg pain s/p CIA stenting, LLE thromboembolectomy on 02-24-17  History of Present Illness  Terri Sparks is a 41 y.o. female who is s/p Aortogram with left lower extremity angiogram, Stent of left common iliac artery with 8 x 69m iCast, and Left lower extremity thromboembolectomy via left common femoral exposure, left popliteal artery exposure and left posterior tibial exposure on 02-24-17 by Dr. CDonzetta Mattersfor acute left lower extremity ischemia.  Findings: There was significant thrombus removed from her iliac arteries on the left. Angiogram demonstrated residual thrombus in the left common iliac artery with an occluded left hypogastric artery. Runoff on the right was unobstructed. A cover stent was placed and postdilated that excluded the remaining thrombus. There was also significant thrombus removed and the profunda femoris artery on the left. Runoff then demonstrated flow through the profunda down through the SFA below-knee popliteal artery and all tibial arteries cut off in the calf with no reconstitution of her foot. An embolectomy was performed from below the knee with similar results on angiogram. We then performed embolectomy for posterior tibial approach at the level of the ankle but could not get return thrombus to clear from the foot and had no runoff. Completion angiogram demonstrated tibials that bruptly end mid calf.  She returns today after phone call received yesterday stating that she was having worsening left leg pain, rating a 10.  She was scheduled the day prior, however her transportation did not arrive.  She also states that she is having drainage from her left groin.  Patient denies fever,chills or coldness or discoloration of her left leg or foot. She is not on an antibiotic now. She reports rash allergy to PCN, also states allergies to Macrobid and sulfa.  Before the above surgery she had pain in her left lateral hip;  that has resolved and since the surgery she reports pain in her left lower leg and incision at the medial aspect of her left ankle.   She has uncontrolled DM, last A1C was 14 or greater on 02-25-17. She reports being diagnosed with DM in January 2018, was placed on metformin only, ran out of metformin, and did not have a PCP until the above surgery [facility].  She reports that she quit smoking on 02-24-17.   The patient is able to complete their activities of daily living.     For VQI Use Only  PRE-ADM LIVING: Home  AMB STATUS: Ambulatory   Past Medical History:  Diagnosis Date  . Anxiety   . Diabetes ([place]   . GERD (gastroesophageal reflux disease)   . History of blood clots   . Hypercalcemia   . Hyperparathyroidism   . Nephrolithiasis   . Renal calculi     Past Surgical History:  Procedure Laterality Date  . EMBOLECTOMY Left 02/24/2017   Procedure: Left Lower Extremity Embolectomy and Angiogram.;  Surgeon: CWaynetta Sandy MD;  [facility]  Service: Vascular;  Laterality: Left;  . INSERTION OF ILIAC STENT  02/24/2017   Procedure: INSERTION OF Common ILIAC STENT;  Surgeon: CWaynetta Sandy MD;  [facility] [facility]  Service: Vascular;;  . LOWER EXTREMITY ANGIOGRAM Left 02/24/2017   Procedure: Aortagram, Left lower extremity Run-off;  Surgeon: CWaynetta Sandy MD;  [place] [facility]  Service: Vascular;  Laterality: Left;  . 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 TUBAL LIGATION      Social History  Social History  . Marital status: Single    Spouse name: N/A  . Number of children: N/A  . Years of education: N/A   Occupational History  . Not on file.   Social History Main Topics  . Smoking status: Current Every Day Smoker    Packs/day: 0.50    Years: 15.00    Types: Cigarettes  . Smokeless tobacco: Never Used  . Alcohol use No  . Drug use: Yes    Types: Marijuana     Comment: occasionally  . Sexual  activity: Not on file   Other Topics Concern  . Not on file   Social History Narrative  . No narrative on file    Allergies  Allergen Reactions  . Macrobid [Nitrofurantoin Macrocrystal] Hives, Shortness Of Breath and Rash  . Other Hives, Shortness Of Breath and Rash    NO "-CILLINS"!!!  . Penicillins Shortness Of Breath    Had had cephalosporins without incident  . Sulfa Antibiotics Hives, Shortness Of Breath and Rash    Current Outpatient Prescriptions on File Prior to Visit  Medication Sig Dispense Refill  . blood glucose meter kit and supplies Dispense based on patient and insurance preference. Use up to four times daily as directed. (FOR ICD-9 250.00, 250.01). 1 each 0  . clonazePAM (KLONOPIN) 0.5 MG tablet Take 1 tablet (0.5 mg total) by mouth 3 (three) times daily as needed (anxiety). 40 tablet 0  . clopidogrel (PLAVIX) 75 MG tablet Take 1 tablet (75 mg total) by mouth daily. 30 tablet 3  . clotrimazole (LOTRIMIN) 1 % cream Apply to affected area 2 times daily 15 g 0  . gabapentin (NEURONTIN) 100 MG capsule Take 2 capsules (200 mg total) by mouth 3 (three) times daily. 90 capsule 3  . glucose blood test strip Use as instructed 100 each 0  . ibuprofen (ADVIL,MOTRIN) 200 MG tablet Take 400 mg by mouth every 6 (six) hours as needed (for pain or headaches).     . insulin aspart (NOVOLOG) 100 UNIT/ML injection Inject 10 Units into the skin 3 (three) times daily with meals. 10 mL 11  . insulin aspart (NOVOLOG) 100 UNIT/ML injection Use scale as needed. CBG 70-120: 0 units, CBG 121-150: 2 units, CBG 151 - 200: 3 units, CBG 201-250: 5 units, CBG 251-300: 8 units, CBG 301-350: 11 units, CBG 351-400: 15 units 10 mL 1  . insulin glargine (LANTUS) 100 UNIT/ML injection Inject 0.4 mLs (40 Units total) into the skin daily. 10 mL 11  . Insulin Pen Needle 31G X 5 MM MISC Use with Lantus and Novolog injections. 100 each 0  . metFORMIN (GLUCOPHAGE) 500 MG tablet Take 1 tablet (500 mg total) by  mouth 2 (two) times daily with a meal. 60 tablet 1  . methocarbamol (ROBAXIN-750) 750 MG tablet Take 1 tablet (750 mg total) by mouth 4 (four) times daily. 30 tablet 0  . Multiple Vitamins-Calcium (ONE-A-DAY WOMENS FORMULA) TABS Take 1 tablet by mouth daily.    . nicotine (NICODERM CQ - DOSED IN MG/24 HOURS) 21 mg/24hr patch Place 1 patch (21 mg total) onto the skin daily. 28 patch 0  . nystatin (MYCOSTATIN) 100000 UNIT/ML suspension Take 5 mLs (500,000 Units total) by mouth 4 (four) times daily. 60 mL 0  . ondansetron (ZOFRAN) 4 MG tablet Take 1 tablet (4 mg total) by mouth every 6 (six) hours. 12 tablet 0  . oxyCODONE-acetaminophen (PERCOCET/ROXICET) 5-325 MG tablet Take 1-2 tablets by mouth every 4 (four) hours as  needed for moderate pain. 30 tablet 0  . predniSONE (DELTASONE) 20 MG tablet Take 2 tablets (40 mg total) by mouth daily. 10 tablet 0  . promethazine (PHENERGAN) 25 MG tablet Take 1 tablet (25 mg total) by mouth every 6 (six) hours as needed for nausea or vomiting. 20 tablet 0  . gabapentin (NEURONTIN) 100 MG capsule Take 1 capsule (100 mg total) by mouth 2 (two) times daily. (Patient not taking: Reported on 02/24/2017) 30 capsule 0   No current facility-administered medications on file prior to visit.      Physical Examination  Vitals:   03/16/17 1435  BP: 124/84  Pulse: 100  Resp: 18  Temp: 98.5 F (36.9 C)  TempSrc: Oral  SpO2: 100%  Weight: 296 lb (134.3 kg)  Height: 5' 7"  (1.702 m)   Body mass index is 46.36 kg/m.  PHYSICAL EXAMINATION: General: The patient appears their stated age. Morbidly obese female.  HEENT:  No gross abnormalities Pulmonary: Respirations are non-labored Abdomen: Soft and non-tender Musculoskeletal: There are no major deformities.   Neurologic: No focal weakness or paresthesias are detected  Skin: There are no ulcer or rashes noted. Left groin incision with small separated area draining watery yellow tinged foul smelling drainage,  moderate amount on ABD pad just placed in fold of left groin/panus when pt was helped to exam room. Incision medial to left knee is healing well with no signs of infection and no drainage. Incision at medial aspect of left ankle with mild swelling and erythema, no drainage.   Psychiatric: The patient has normal affect. Cardiovascular: There is a regular rate and rhythm without significant murmur appreciated.   Vascular: Vessel Right Left  Radial Palpable Palpable  Aorta Not palpable N/A  Femoral Palpable Not Palpable  Popliteal Not palpable Not palpable  PT not Palpable not Palpable, + Doppler signal  DP  Palpable not Palpable, + Doppler signal    Medical Decision Making  Terri Sparks is a 41 y.o. female who presents s/p stenting of left common iliac artery with 8 x 53m iCast, and Left lower extremity thromboembolectomy via left common femoral exposure, left popliteal artery exposure and left posterior tibial exposure on 02-24-17. Her DM is uncontrolled. She was taking no medications until the above surgery and hospitalization since she had no PCP.  She stopped smoking the day of the surgery. She is morbidly obese.   Dr. DScot Dockspoke with and examined pt, no tunneling of small open wound at left groin (probed with sterile cotton swab).   Foul smelling watery drainage from small opening left groin incision: Cipro 500 mg po bid x 10 days, disp #20, 0 refills.  Shower daily with antibacterial soap, change left groin dressing as often as needed. Keep appointment on 6-19- for non invasive vascular testing, and 03-23-17 to see Dr. CDonzetta Matters The patient is currently not on a statin. The patient is currently on an anti-platelet: Plavix.   Thank you for allowing [facility] participate in this patient's care.  Radiah Lubinski, SSharmon Leyden RN, MSN, FNP-C [facility]: [phone] 03/16/2017, 2:47 PM  Clinic MD: BCarloyn Jaeger

## 2017-03-17 ENCOUNTER — Ambulatory Visit: Payer: Medicaid Other | Attending: Vascular Surgery | Admitting: Physical Therapy

## 2017-03-17 NOTE — Addendum Note (Signed)
Addended by: Lianne Cure A on: 03/17/2017 04:21 PM   Modules accepted: Orders

## 2017-03-21 ENCOUNTER — Inpatient Hospital Stay: Payer: Self-pay | Admitting: Internal Medicine

## 2017-03-22 ENCOUNTER — Ambulatory Visit (HOSPITAL_COMMUNITY)
Admission: RE | Admit: 2017-03-22 | Discharge: 2017-03-22 | Disposition: A | Discharge status: Home / Self Care | Payer: Self-pay | Source: Ambulatory Visit | Attending: Vascular Surgery | Admitting: Vascular Surgery

## 2017-03-22 DIAGNOSIS — E1165 Type 2 diabetes mellitus with hyperglycemia: Secondary | ICD-10-CM

## 2017-03-22 DIAGNOSIS — I779 Disorder of arteries and arterioles, unspecified: Secondary | ICD-10-CM

## 2017-03-22 DIAGNOSIS — IMO0002 Reserved for concepts with insufficient information to code with codable children: Secondary | ICD-10-CM

## 2017-03-22 DIAGNOSIS — Z95828 Presence of other vascular implants and grafts: Secondary | ICD-10-CM

## 2017-03-22 DIAGNOSIS — E1151 Type 2 diabetes mellitus with diabetic peripheral angiopathy without gangrene: Secondary | ICD-10-CM

## 2017-03-23 ENCOUNTER — Ambulatory Visit (INDEPENDENT_AMBULATORY_CARE_PROVIDER_SITE_OTHER): Payer: Self-pay | Admitting: Vascular Surgery

## 2017-03-23 ENCOUNTER — Encounter: Payer: Self-pay | Admitting: Vascular Surgery

## 2017-03-23 VITALS — BP 124/60 | HR 98 | Temp 97.8°F | Resp 24 | Ht 67.0 in | Wt 282.0 lb

## 2017-03-23 DIAGNOSIS — I779 Disorder of arteries and arterioles, unspecified: Secondary | ICD-10-CM

## 2017-03-23 DIAGNOSIS — Z95828 Presence of other vascular implants and grafts: Secondary | ICD-10-CM

## 2017-03-23 DIAGNOSIS — E1165 Type 2 diabetes mellitus with hyperglycemia: Secondary | ICD-10-CM

## 2017-03-23 DIAGNOSIS — IMO0002 Reserved for concepts with insufficient information to code with codable children: Secondary | ICD-10-CM

## 2017-03-23 DIAGNOSIS — E1151 Type 2 diabetes mellitus with diabetic peripheral angiopathy without gangrene: Secondary | ICD-10-CM

## 2017-03-23 NOTE — Progress Notes (Signed)
Patient ID: Terri Sparks, female   DOB: Jan 18, 1976, 41 y.o.   MRN: [id number]  Reason for Consult: Routine Post Op (Had hives,burning sensation on skin on Cipro.  )   Referred by No ref. provider found  Subjective:     HPI:  Terri Sparks is a 41 y.o. female follows up postop from left lower extremity thromboembolectomy and stent of her left common iliac artery for acute limb ischemia with thrombosis of most of her left lower extremity. She has done well now and walking with some pain at her incisional sites. She is recently seen and had drainage from her left groin anterior incision that has not healed superiorly which is not having fevers or chills. [facility]. Most important she has not taken drugs or been smoking of recent. She continues to take her Plavix as prescribed.  Past Medical History:  Diagnosis Date  . Anxiety   . Diabetes ([place])   . GERD (gastroesophageal reflux disease)   . History of blood clots   . Hypercalcemia   . Hyperparathyroidism   . Nephrolithiasis   . Renal calculi    Family History  Problem Relation Age of Onset  . Depression Mother   . Diabetes Father   . Heart failure Father    Past Surgical History:  Procedure Laterality Date  . EMBOLECTOMY Left 02/24/2017   Procedure: Left Lower Extremity Embolectomy and Angiogram.;  Surgeon: Waynetta Sandy, MD;  Location: [place] [facility];  Service: Vascular;  Laterality: Left;  . INSERTION OF ILIAC STENT  02/24/2017   Procedure: INSERTION OF Common ILIAC STENT;  Surgeon: Waynetta Sandy, MD;  Location: [facility];  Service: Vascular;;  . LOWER EXTREMITY ANGIOGRAM Left 02/24/2017   Procedure: Aortagram, Left lower extremity Run-off;  Surgeon: Waynetta Sandy, MD;  Location: [facility];  Service: Vascular;  Laterality: Left;  . 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 TUBAL LIGATION      Short Social History:  Social History    Substance Use Topics  . Smoking status: Current Every Day Smoker    Packs/day: 0.50    Years: 15.00    Types: Cigarettes  . Smokeless tobacco: Never Used  . Alcohol use No    Allergies  Allergen Reactions  . Ciprofloxacin Hcl Hives  . Macrobid [Nitrofurantoin Macrocrystal] Hives, Shortness Of Breath and Rash  . Other Hives, Shortness Of Breath and Rash    NO "-CILLINS"!!!  . Penicillins Shortness Of Breath    Had had cephalosporins without incident  . Sulfa Antibiotics Hives, Shortness Of Breath and Rash    Current Outpatient Prescriptions  Medication Sig Dispense Refill  . blood glucose meter kit and supplies Dispense based on patient and insurance preference. Use up to four times daily as directed. (FOR ICD-9 250.00, 250.01). 1 each 0  . ciprofloxacin (CIPRO) 500 MG tablet Take 1 tablet (500 mg total) by mouth 2 (two) times daily. 20 tablet 0  . clonazePAM (KLONOPIN) 0.5 MG tablet Take 1 tablet (0.5 mg total) by mouth 3 (three) times daily as needed (anxiety). 40 tablet 0  . clopidogrel (PLAVIX) 75 MG tablet Take 1 tablet (75 mg total) by mouth daily. 30 tablet 3  . clotrimazole (LOTRIMIN) 1 % cream Apply to affected area 2 times daily 15 g 0  . gabapentin (NEURONTIN) 100 MG capsule Take 1 capsule (100 mg total) by mouth 2 (two) times daily. (Patient not taking: Reported  on 02/24/2017) 30 capsule 0  . gabapentin (NEURONTIN) 100 MG capsule Take 2 capsules (200 mg total) by mouth 3 (three) times daily. 90 capsule 3  . glucose blood test strip Use as instructed 100 each 0  . ibuprofen (ADVIL,MOTRIN) 200 MG tablet Take 400 mg by mouth every 6 (six) hours as needed (for pain or headaches).     . insulin aspart (NOVOLOG) 100 UNIT/ML injection Inject 10 Units into the skin 3 (three) times daily with meals. 10 mL 11  . insulin aspart (NOVOLOG) 100 UNIT/ML injection Use scale as needed. CBG 70-120: 0 units, CBG 121-150: 2 units, CBG 151 - 200: 3 units, CBG 201-250: 5 units, CBG 251-300: 8  units, CBG 301-350: 11 units, CBG 351-400: 15 units 10 mL 1  . insulin glargine (LANTUS) 100 UNIT/ML injection Inject 0.4 mLs (40 Units total) into the skin daily. 10 mL 11  . Insulin Pen Needle 31G X 5 MM MISC Use with Lantus and Novolog injections. 100 each 0  . metFORMIN (GLUCOPHAGE) 500 MG tablet Take 1 tablet (500 mg total) by mouth 2 (two) times daily with a meal. 60 tablet 1  . methocarbamol (ROBAXIN-750) 750 MG tablet Take 1 tablet (750 mg total) by mouth 4 (four) times daily. 30 tablet 0  . Multiple Vitamins-Calcium (ONE-A-DAY WOMENS FORMULA) TABS Take 1 tablet by mouth daily.    . nicotine (NICODERM CQ - DOSED IN MG/24 HOURS) 21 mg/24hr patch Place 1 patch (21 mg total) onto the skin daily. 28 patch 0  . nystatin (MYCOSTATIN) 100000 UNIT/ML suspension Take 5 mLs (500,000 Units total) by mouth 4 (four) times daily. 60 mL 0  . ondansetron (ZOFRAN) 4 MG tablet Take 1 tablet (4 mg total) by mouth every 6 (six) hours. 12 tablet 0  . oxyCODONE-acetaminophen (PERCOCET/ROXICET) 5-325 MG tablet Take 1-2 tablets by mouth every 4 (four) hours as needed for moderate pain. 30 tablet 0  . predniSONE (DELTASONE) 20 MG tablet Take 2 tablets (40 mg total) by mouth daily. 10 tablet 0  . promethazine (PHENERGAN) 25 MG tablet Take 1 tablet (25 mg total) by mouth every 6 (six) hours as needed for nausea or vomiting. 20 tablet 0   No current facility-administered medications for this visit.     Review of Systems  Constitutional:  Constitutional negative. Respiratory: Respiratory negative.  Cardiovascular: Positive for leg swelling.  GI: Gastrointestinal negative.  Musculoskeletal: Positive for leg pain and joint pain.  Hematologic: Hematologic/lymphatic negative.  Psychiatric: Psychiatric negative.        Objective:  Objective   There were no vitals filed for this visit. There is no height or weight on file to calculate BMI.  Physical Exam  Constitutional: She is oriented to person, place, and  time. She appears well-developed.  HENT:  Head: Normocephalic.  Eyes: Pupils are equal, round, and reactive to light.  Cardiovascular:  Strong peroneal signal, signal in lateral foot  Cap refill of toes on left <2s  Pulmonary/Chest: Effort normal.  Abdominal: Soft. She exhibits no mass.  Musculoskeletal: Normal range of motion.  Neurological: She is alert and oriented to person, place, and time.  Skin:  Left groin wound with 0.5cm opening, no tracking  Psychiatric: She has a normal mood and affect. Her behavior is normal. Judgment and thought content normal.    Data: ABI on right 1.3 left 1.17 with digital pressure 136.     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 She remains on Plavix. She has now quit smoking and is seen alone center to control her poorly controlled diabetes. Her pain is minimal at this time she has strong toe pressures. Will follow-up for 3 months with ABI and left lower extremity duplex. Should she have further wound issues she can be seen sooner.     Waynetta Sandy MD [facility]

## 2017-03-24 NOTE — Addendum Note (Signed)
Addended by: Lianne Cure A on: 03/24/2017 12:02 PM   Modules accepted: Orders

## 2017-04-01 MED FILL — LANTUS 100 UNITS/ML VIAL: 100 | 25 days supply | Qty: 10 | Fill #0

## 2017-04-01 MED FILL — ?ONDANSETRON HCL 4 MG TABLE: 4 | 30 days supply | Qty: 30 | Fill #0

## 2017-04-01 MED FILL — !NOVOLOG 100UNITS/ML VIAL: 100/ML | 28 days supply | Qty: 10 | Fill #0

## 2017-04-04 ENCOUNTER — Inpatient Hospital Stay: Payer: Medicaid Other | Admitting: Internal Medicine

## 2017-04-28 ENCOUNTER — Ambulatory Visit: Payer: Medicaid Other | Admitting: Licensed Clinical Social Worker

## 2017-04-28 ENCOUNTER — Encounter: Payer: Self-pay | Admitting: Internal Medicine

## 2017-04-28 ENCOUNTER — Other Ambulatory Visit: Payer: Self-pay

## 2017-04-28 ENCOUNTER — Ambulatory Visit: Payer: Self-pay | Attending: Internal Medicine | Admitting: Internal Medicine

## 2017-04-28 VITALS — BP 129/84 | HR 113 | Temp 98.0°F | Resp 16 | Wt 286.6 lb

## 2017-04-28 DIAGNOSIS — F32A Depression, unspecified: Secondary | ICD-10-CM | POA: Insufficient documentation

## 2017-04-28 DIAGNOSIS — N200 Calculus of kidney: Secondary | ICD-10-CM | POA: Insufficient documentation

## 2017-04-28 DIAGNOSIS — Z833 Family history of diabetes mellitus: Secondary | ICD-10-CM | POA: Insufficient documentation

## 2017-04-28 DIAGNOSIS — F419 Anxiety disorder, unspecified: Secondary | ICD-10-CM | POA: Insufficient documentation

## 2017-04-28 DIAGNOSIS — Z86718 Personal history of other venous thrombosis and embolism: Secondary | ICD-10-CM | POA: Insufficient documentation

## 2017-04-28 DIAGNOSIS — IMO0001 Reserved for inherently not codable concepts without codable children: Secondary | ICD-10-CM

## 2017-04-28 DIAGNOSIS — Z818 Family history of other mental and behavioral disorders: Secondary | ICD-10-CM | POA: Insufficient documentation

## 2017-04-28 DIAGNOSIS — Z8249 Family history of ischemic heart disease and other diseases of the circulatory system: Secondary | ICD-10-CM | POA: Insufficient documentation

## 2017-04-28 DIAGNOSIS — F17200 Nicotine dependence, unspecified, uncomplicated: Secondary | ICD-10-CM

## 2017-04-28 DIAGNOSIS — L97322 Non-pressure chronic ulcer of left ankle with fat layer exposed: Secondary | ICD-10-CM

## 2017-04-28 DIAGNOSIS — E1165 Type 2 diabetes mellitus with hyperglycemia: Secondary | ICD-10-CM | POA: Insufficient documentation

## 2017-04-28 DIAGNOSIS — Z880 Allergy status to penicillin: Secondary | ICD-10-CM | POA: Insufficient documentation

## 2017-04-28 DIAGNOSIS — Z87891 Personal history of nicotine dependence: Secondary | ICD-10-CM | POA: Insufficient documentation

## 2017-04-28 DIAGNOSIS — G5792 Unspecified mononeuropathy of left lower limb: Secondary | ICD-10-CM | POA: Insufficient documentation

## 2017-04-28 DIAGNOSIS — Z794 Long term (current) use of insulin: Secondary | ICD-10-CM | POA: Insufficient documentation

## 2017-04-28 DIAGNOSIS — N390 Urinary tract infection, site not specified: Secondary | ICD-10-CM | POA: Insufficient documentation

## 2017-04-28 DIAGNOSIS — I745 Embolism and thrombosis of iliac artery: Secondary | ICD-10-CM | POA: Insufficient documentation

## 2017-04-28 DIAGNOSIS — E1151 Type 2 diabetes mellitus with diabetic peripheral angiopathy without gangrene: Secondary | ICD-10-CM | POA: Insufficient documentation

## 2017-04-28 DIAGNOSIS — Z79899 Other long term (current) drug therapy: Secondary | ICD-10-CM | POA: Insufficient documentation

## 2017-04-28 DIAGNOSIS — F329 Major depressive disorder, single episode, unspecified: Secondary | ICD-10-CM

## 2017-04-28 DIAGNOSIS — E1141 Type 2 diabetes mellitus with diabetic mononeuropathy: Secondary | ICD-10-CM | POA: Insufficient documentation

## 2017-04-28 DIAGNOSIS — I739 Peripheral vascular disease, unspecified: Secondary | ICD-10-CM

## 2017-04-28 DIAGNOSIS — K219 Gastro-esophageal reflux disease without esophagitis: Secondary | ICD-10-CM | POA: Insufficient documentation

## 2017-04-28 DIAGNOSIS — Z9889 Other specified postprocedural states: Secondary | ICD-10-CM | POA: Insufficient documentation

## 2017-04-28 DIAGNOSIS — E118 Type 2 diabetes mellitus with unspecified complications: Secondary | ICD-10-CM

## 2017-04-28 LAB — GLUCOSE, POCT (MANUAL RESULT ENTRY): POC Glucose: 340 mg/dl — AB (ref 70–99)

## 2017-04-28 LAB — POCT GLYCOSYLATED HEMOGLOBIN (HGB A1C): Hemoglobin A1C: 9.4

## 2017-04-28 MED ORDER — INSULIN ASPART 100 UNIT/ML ~~LOC~~ SOLN: SUBCUTANEOUS | 11 refills | Status: DC

## 2017-04-28 MED ORDER — ESCITALOPRAM OXALATE 10 MG PO TABS: 10.0000 mg | ORAL_TABLET | Freq: Every day | ORAL | 4 refills | Status: DC

## 2017-04-28 MED ORDER — NICOTINE 14 MG/24HR TD PT24: 14.0000 mg | MEDICATED_PATCH | Freq: Every day | TRANSDERMAL | 1 refills | Status: DC

## 2017-04-28 MED ORDER — "INSULIN SYRINGE-NEEDLE U-100 31G X 5/16"" 0.5 ML MISC": 3 refills | Status: DC

## 2017-04-28 MED ORDER — CLOPIDOGREL BISULFATE 75 MG PO TABS: 75.0000 mg | ORAL_TABLET | Freq: Every day | ORAL | 3 refills | Status: DC

## 2017-04-28 MED ORDER — INSULIN PEN NEEDLE 31G X 5 MM MISC: 11 refills | Status: DC

## 2017-04-28 MED ORDER — INSULIN GLARGINE 100 UNIT/ML ~~LOC~~ SOLN: 40.0000 [IU] | Freq: Every day | SUBCUTANEOUS | 11 refills | Status: DC

## 2017-04-28 MED ORDER — METFORMIN HCL 500 MG PO TABS: 500.0000 mg | ORAL_TABLET | Freq: Two times a day (BID) | ORAL | 3 refills | Status: DC

## 2017-04-28 MED ORDER — GABAPENTIN 300 MG PO CAPS: 300.0000 mg | ORAL_CAPSULE | Freq: Two times a day (BID) | ORAL | 3 refills | Status: DC

## 2017-04-28 MED FILL — TRUEPLUS SYR 1ML 31GX5/16: 31G X 5/16" | 30 days supply | Qty: 100 | Fill #0

## 2017-04-28 MED FILL — ESCITALOPRAM 10 MG TABLET: 10 | 30 days supply | Qty: 30 | Fill #0

## 2017-04-28 MED FILL — GABAPENTIN 300 MG CAPSULE: 300 | 30 days supply | Qty: 60 | Fill #0

## 2017-04-28 MED FILL — ?METFORMIN HCL 500MG TABLET: 500 | 30 days supply | Qty: 60 | Fill #0

## 2017-04-28 NOTE — BH Specialist Note (Signed)
Integrated Behavioral Health Initial Visit  MRN: [id number] Name: Terri Sparks   Session Start time: 3:15 PM Session End time: 3:40 PM Total time: 25 minutes  Type of Service: [place] Interpretor:No. Interpretor Name and Language: N/A   Warm Hand Off Completed.       SUBJECTIVE: Terri Sparks is a 41 y.o. female accompanied by patient. Patient was referred by Dr. Wynetta Emery for depression and anxiety. Patient reports the following symptoms/concerns: overwhelming feelings of sadness and worry, difficulty sleeping, low energy, difficulty focusing, suicidal ideations, restlessness, and irritability Duration of problem: Ongoing; Severity of problem: severe  OBJECTIVE: Mood: Anxious and Depressed and Affect: Tearful Risk of harm to self or others: Suicidal ideation No plan to harm self or others   LIFE CONTEXT: Family and Social: Pt resides with fiance, adult son, and minor (46 yo) grandson School/Work: Pt receives food stamps. Fiance is employed Self-Care: Pt pulls out her hair as a way to cope with stressors. She is open to medication management  Life Changes: Pt is residing in a motel. She has increased stress from ongoing medical concerns and has limited support  GOALS ADDRESSED: Patient will reduce symptoms of: anxiety and depression and increase knowledge and/or ability of: coping skills and also: Increase healthy adjustment to current life circumstances and Increase adequate support systems for patient/family   INTERVENTIONS: Solution-Focused Strategies, Supportive Counseling, Psychoeducation and/or Health Education and Link to [employer]  Standardized Assessments completed: GAD-7 and PHQ 2&9 with C-SSRS  ASSESSMENT: Patient currently experiencing depression and anxiety triggered by ongoing medical concerns and financial strain. She reports overwhelming feelings of sadness and worry, difficulty sleeping, low energy,  difficulty focusing, suicidal ideations, restlessness, and irritability. Pt receives limited support. She reports SI on a daily basis with no plan or intent to harm self or others. Patient may benefit from psychoeducation, psychotherapy, and medication management. [facility] educated pt on how stress can negatively impact one's physical and mental health. LCSWA inquired about protective factors and discussed safety plan. Pt was educated on benefits of applying healthy coping skills to decrease symptoms. Pt successfully identified healthy strategies to implement on a routine basis, including medication management through PCP. LCSWA provided pt with resources for crisis intervention, psychotherapy, and obtaining affordable housing. Pt was strongly encouraged to complete Financial Counseling.   PLAN: 1. Follow up with behavioral health clinician on : Pt was encouraged to contact LCSWA if symptoms worsen or fail to improve to schedule behavioral appointments at [facility]. 2. Behavioral recommendations: LCSWA recommends that pt apply healthy coping skills discussed, comply with medication management, and utilize provided resources. Pt is encouraged to schedule follow up appointment with LCSWA 3. Referral(s): [facility] (In Clinic), [place] (LME/Outside Clinic) and [employer] Resources:  Housing 4. "From scale of 1-10, how likely are you to follow plan?": 7/10  Rebekah [place], LCSW 05/03/17 5:26 PM

## 2017-04-28 NOTE — Progress Notes (Signed)
Patient ID: Terri Sparks, female    DOB: 03-Sep-1976  MRN: [id number]  CC: Initial Prenatal Visit   Subjective: Terri Sparks is a 41 y.o. female who presents for new pt visit. PCP was Terri Sparks at [facility]. Her concerns today include:  41 year old female with history of diabetes type 2, tobacco dependence, peripheral vascular disease with recent left lower extremity acute limb ischemia requiring stent to the iliac artery and embolectomy followed by Dr. Donzetta Sparks of vascular surgery, hyperparathyroidism s/p resection of parathryroid adenoma 2010, GERD, and anxiety  1. DM: -Started on insulin during recent hospitalization Checking BS 8 x a day -BS 137-157.  BS down since being on insulin - Novolog 5-15 units, on average 5 units TID, Lantus 40 units -doing well with eating habits -waking 2 x a day for 1-2 blocks as long as legs allow -  2. Tob dep: had quit but back to 3 patches a day. On patches which she finds helpful  3. Depression/Anxiety -"I feel everyone is against me.  I have a lot of mental issues."  Not able to do as much as she would like since acute ischemic issue with left leg.  "It is lot to deal with at times."  Use to be a social person + SI every day. No formal plan  4. Request refill gabapentin which she was placed on by vascular surgeon Dr. Donzetta Sparks for neuropathic symptoms in left leg post surgery. She reports numbness in the leg. 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 States she was followed by orthopedic Terri Sparks for this but was last seen in 2013. Last PCP at her on Vicodin but she was subsequently discharged from the practice and was accused of abuse of the medication.  5. PAD: Still has slight opening over the medial aspect of left ankle and left groin from recent vascular procedures. She reports that it is healing nicely. She does daily dressing.  Patient Active Problem List   Diagnosis Date Noted  . Iliac artery occlusion, left ([place]) 02/25/2017  . Tobacco abuse 02/25/2017  . Diabetes mellitus, type II ([place]) 02/25/2017  . UTI (urinary tract infection) 02/25/2017  . Peripheral vascular disease of lower extremity ([place]) 02/24/2017  . History of blood clots   . GERD (gastroesophageal reflux disease)   . Renal calculi   . Hyperparathyroidism   . Hypercalcemia   . Anxiety   . Nephrolithiasis      Current Outpatient Prescriptions on File Prior to Visit  Medication Sig Dispense Refill  . blood glucose meter kit and supplies Dispense based on patient and insurance preference. Use up to four times daily as directed. (FOR ICD-9 250.00, 250.01). 1 each 0  . clonazePAM (KLONOPIN) 0.5 MG tablet Take 1 tablet (0.5 mg total) by mouth 3 (three) times daily as needed (anxiety). 40 tablet 0  . clopidogrel (PLAVIX) 75 MG tablet Take 1 tablet (75 mg total) by mouth daily. 30 tablet 3  . glucose blood test strip Use as instructed 100 each 0  . ibuprofen (ADVIL,MOTRIN) 200 MG tablet Take 400 mg by mouth every 6 (six) hours as needed (for pain or headaches).     . Multiple Vitamins-Calcium (ONE-A-DAY WOMENS FORMULA) TABS Take 1 tablet by mouth daily.     No current facility-administered medications on file prior to visit.     Allergies  Allergen Reactions  . Ciprofloxacin Hcl Hives  .  Macrobid [Nitrofurantoin Macrocrystal] Hives, Shortness Of Breath and Rash  . Other Hives, Shortness Of Breath and Rash    NO "-CILLINS"!!!  . Penicillins Shortness Of Breath    Had had cephalosporins without incident  . Sulfa Antibiotics Hives, Shortness Of Breath and Rash    Social History   Social History  . Marital status: Single    Spouse name: N/A  . Number of children: N/A  . Years of education: N/A   Occupational History  . Not on file.   Social History Main Topics  . Smoking status: Former Smoker    Years: 15.00    Types: Cigarettes    Quit date: 03/04/2017  . Smokeless  tobacco: Never Used  . Alcohol use No  . Drug use: Yes    Types: Marijuana     Comment: occasionally  . Sexual activity: Not on file   Other Topics Concern  . Not on file   Social History Narrative  . No narrative on file    Family History  Problem Relation Age of Onset  . Depression Mother   . Diabetes Father   . Heart failure Father     Past Surgical History:  Procedure Laterality Date  . EMBOLECTOMY Left 02/24/2017   Procedure: Left Lower Extremity Embolectomy and Angiogram.;  Surgeon: Waynetta Sandy, MD;  Location: [place];  Service: Vascular;  Laterality: Left;  . INSERTION OF ILIAC STENT  02/24/2017   Procedure: INSERTION OF Common ILIAC STENT;  Surgeon: Waynetta Sandy, MD;  Location: [place];  Service: Vascular;;  . LOWER EXTREMITY ANGIOGRAM Left 02/24/2017   Procedure: Aortagram, Left lower extremity Run-off;  Surgeon: Waynetta Sandy, MD;  Location: [place];  Service: Vascular;  Laterality: Left;  . 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 TUBAL LIGATION      ROS: Review of Systems Negative except as stated above PHYSICAL EXAM: BP 129/84   Pulse (!) 113   Temp 98 F (36.7 C) (Oral)   Resp 16   Wt 286 lb 9.6 oz (130 kg)   SpO2 99%   BMI 44.89 kg/m   Wt Readings from Last 3 Encounters:  04/28/17 286 lb 9.6 oz (130 kg)  03/23/17 282 lb (127.9 kg)  03/16/17 296 lb (134.3 kg)    Physical Exam General appearance - alert, well appearing, and in no distress Mental status - tearful at times Eyes - pupils equal and reactive, extraocular eye movements intact Mouth - mucous membranes moist, pharynx normal without lesions Neck - supple, no significant adenopathy Chest - clear to auscultation, no wheezes, rales or rhonchi, symmetric air entry Heart - normal rate, regular rhythm, normal S1, S2, no murmurs, rubs, clicks or gallops Extremities - peripheral pulses normal, no pedal edema, no clubbing or cyanosis Skin - small old at the  beginning of surgical site in the left groin. Pink tissue. No drainage noted. The left ankle: 2 cm shallow exudative ulcer at the end of surgical incision. Slight clear drainage. Patient reports this is markedly improved  Depression screen [facility] 2/9 04/28/2017  Decreased Interest 1  Down, Depressed, Hopeless 3  PHQ - 2 Score 4  Altered sleeping 3  Tired, decreased energy 1  Change in appetite 2  Feeling bad or failure about yourself  3  Trouble concentrating 3  Moving slowly or fidgety/restless 1  Suicidal thoughts 3  PHQ-9 Score 20   GAD 7 : Generalized Anxiety Score 04/28/2017  Nervous, Anxious, on Edge 3  Control/stop  worrying 3  Worry too much - different things 3  Trouble relaxing 3  Restless 3  Easily annoyed or irritable 3  Afraid - awful might happen 3  Total GAD 7 Score 21    Results for orders placed or performed in visit on 04/28/17  POCT glucose (manual entry)  Result Value Ref Range   POC Glucose 340 (A) 70 - 99 mg/dl  POCT glycosylated hemoglobin (Hb A1C)  Result Value Ref Range   Hemoglobin A1C 9.4    Lab Results  Component Value Date   HGBA1C 9.4 04/28/2017    Results for orders placed or performed in visit on 04/28/17  POCT glucose (manual entry)  Result Value Ref Range   POC Glucose 340 (A) 70 - 99 mg/dl  POCT glycosylated hemoglobin (Hb A1C)  Result Value Ref Range   Hemoglobin A1C 9.4     ASSESSMENT AND PLAN: 1. uncontrolled type 2 diabetes mellitus without complication, with long-term current use of insulin (HCC) -Continue NovoLog and Lantus. Add metformin. Dietary counseling given. Patient encouraged to continue exercising as tolerated - POCT glucose (manual entry) - insulin glargine (LANTUS) 100 UNIT/ML injection; Inject 0.4 mLs (40 Units total) into the skin daily.  Dispense: 10 mL; Refill: 11 - Insulin Pen Needle 31G X 5 MM MISC; Use with Lantus and Novolog injections.  Dispense: 100 each; Refill: 11 - insulin aspart (NOVOLOG) 100 UNIT/ML  injection; 5-15 units subcut TID with meals  Dispense: 10 mL; Refill: 11 - POCT glycosylated hemoglobin (Hb A1C) - metFORMIN (GLUCOPHAGE) 500 MG tablet; Take 1 tablet (500 mg total) by mouth 2 (two) times daily with a meal.  Dispense: 180 tablet; Refill: 3  2. Tobacco dependence -Advised to quit completely. She is trying to quit and wants to quit. -She requested more of the nicotine patches. - nicotine (NICODERM CQ - DOSED IN MG/24 HOURS) 14 mg/24hr patch; Place 1 patch (14 mg total) onto the skin daily.  Dispense: 28 patch; Refill: 1  3. Anxiety and depression -LCSW to see patient today. She is agreeable to starting antidepressant.  start her on a low dose of Lexapro. Patient informed it will take about 4-6 weeks before she starts to feel better on the medication  - escitalopram (LEXAPRO) 10 MG tablet; Take 1 tablet (10 mg total) by mouth daily.  Dispense: 30 tablet; Refill: 4  4. PVD (peripheral vascular disease) ([place]) 5. Skin ulcer of left ankle with fat layer exposed ([place]) I'm concerned about the wound on the left ankle. Will try to get her back to vascular surgeon to have a look at this  6. Neuropathy of left lower extremity -Patient requested higher dose of Neurontin as the 100 mg does not seem to be sufficient for controlling symptoms - gabapentin (NEURONTIN) 300 MG capsule; Take 1 capsule (300 mg total) by mouth 2 (two) times daily.  Dispense: 60 capsule; Refill: 3  Patient was given the opportunity to ask questions.  Patient verbalized understanding of the plan and was able to repeat key elements of the plan.   Orders Placed This Encounter  Procedures  . POCT glucose (manual entry)  . POCT glycosylated hemoglobin (Hb A1C)     Requested Prescriptions   Signed Prescriptions Disp Refills  . escitalopram (LEXAPRO) 10 MG tablet 30 tablet 4    Sig: Take 1 tablet (10 mg total) by mouth daily.  Marland Kitchen gabapentin (NEURONTIN) 300 MG capsule 60 capsule 3    Sig: Take 1 capsule (300 mg  total) by  mouth 2 (two) times daily.  . insulin glargine (LANTUS) 100 UNIT/ML injection 10 mL 11    Sig: Inject 0.4 mLs (40 Units total) into the skin daily.  . Insulin Pen Needle 31G X 5 MM MISC 100 each 11    Sig: Use with Lantus and Novolog injections.  . insulin aspart (NOVOLOG) 100 UNIT/ML injection 10 mL 11    Sig: 5-15 units subcut TID with meals  . nicotine (NICODERM CQ - DOSED IN MG/24 HOURS) 14 mg/24hr patch 28 patch 1    Sig: Place 1 patch (14 mg total) onto the skin daily.  . metFORMIN (GLUCOPHAGE) 500 MG tablet 180 tablet 3    Sig: Take 1 tablet (500 mg total) by mouth 2 (two) times daily with a meal.    Return in about 2 weeks (around 05/12/2017) for pap.  Karle Plumber, MD, FACP

## 2017-04-29 MED FILL — CLOPIDOGREL 75 MG TABLET: 75 | 30 days supply | Qty: 30 | Fill #0

## 2017-05-02 MED FILL — !NOVOLOG 100UNITS/ML VIAL: 100/ML | 28 days supply | Qty: 10 | Fill #0

## 2017-05-02 MED FILL — LANTUS 100 UNITS/ML VIAL: 100 | 25 days supply | Qty: 10 | Fill #0

## 2017-05-03 ENCOUNTER — Encounter: Payer: Self-pay | Admitting: Family

## 2017-05-04 ENCOUNTER — Ambulatory Visit: Payer: Medicaid Other | Admitting: Family

## 2017-05-13 ENCOUNTER — Other Ambulatory Visit: Payer: Medicaid Other | Admitting: Internal Medicine

## 2017-05-17 ENCOUNTER — Encounter: Payer: Self-pay | Admitting: Family

## 2017-05-18 ENCOUNTER — Ambulatory Visit: Payer: Medicaid Other | Admitting: Family

## 2017-05-30 MED FILL — GABAPENTIN 300 MG CAPSULE: 300 | 30 days supply | Qty: 60 | Fill #1

## 2017-05-30 MED FILL — ESCITALOPRAM 10 MG TABLET: 10 | 30 days supply | Qty: 30 | Fill #1

## 2017-05-30 MED FILL — ?METFORMIN HCL 500MG TABLET: 500 | 30 days supply | Qty: 60 | Fill #1

## 2017-05-30 MED FILL — ?CLOPIDOGREL 75 MG TABLET: 75 | 30 days supply | Qty: 30 | Fill #1

## 2017-07-01 ENCOUNTER — Ambulatory Visit (HOSPITAL_COMMUNITY)
Admission: RE | Admit: 2017-07-01 | Discharge: 2017-07-01 | Disposition: A | Discharge status: Home / Self Care | Payer: Self-pay | Source: Ambulatory Visit | Attending: Vascular Surgery | Admitting: Vascular Surgery

## 2017-07-01 ENCOUNTER — Ambulatory Visit (HOSPITAL_COMMUNITY)
Admission: RE | Admit: 2017-07-01 | Discharge: 2017-07-01 | Disposition: A | Discharge status: Home / Self Care | Payer: Medicaid Other | Source: Ambulatory Visit | Attending: Family | Admitting: Family

## 2017-07-01 ENCOUNTER — Encounter: Payer: Self-pay | Admitting: Vascular Surgery

## 2017-07-01 ENCOUNTER — Ambulatory Visit (INDEPENDENT_AMBULATORY_CARE_PROVIDER_SITE_OTHER): Payer: Self-pay | Admitting: Vascular Surgery

## 2017-07-01 DIAGNOSIS — E1151 Type 2 diabetes mellitus with diabetic peripheral angiopathy without gangrene: Secondary | ICD-10-CM | POA: Insufficient documentation

## 2017-07-01 DIAGNOSIS — E1165 Type 2 diabetes mellitus with hyperglycemia: Secondary | ICD-10-CM | POA: Insufficient documentation

## 2017-07-01 DIAGNOSIS — Z95828 Presence of other vascular implants and grafts: Secondary | ICD-10-CM | POA: Insufficient documentation

## 2017-07-01 DIAGNOSIS — I779 Disorder of arteries and arterioles, unspecified: Secondary | ICD-10-CM

## 2017-07-01 DIAGNOSIS — I70292 Other atherosclerosis of native arteries of extremities, left leg: Secondary | ICD-10-CM | POA: Insufficient documentation

## 2017-07-01 DIAGNOSIS — M7989 Other specified soft tissue disorders: Secondary | ICD-10-CM | POA: Insufficient documentation

## 2017-07-01 DIAGNOSIS — IMO0002 Reserved for concepts with insufficient information to code with codable children: Secondary | ICD-10-CM

## 2017-07-01 DIAGNOSIS — R9439 Abnormal result of other cardiovascular function study: Secondary | ICD-10-CM | POA: Insufficient documentation

## 2017-07-01 LAB — VAS US LOWER EXTREMITY ARTERIAL DUPLEX
RIGHT ANT DIST TIBAL SYS PSV: 156 cm/s
RIGHT POST TIB DIST SYS: 45 cm/s
Right peroneal sys PSV: 29 cm/s
Right super femoral dist sys PSV: -78 cm/s
Right super femoral mid sys PSV: -113 cm/s
Right super femoral prox sys PSV: 138 cm/s

## 2017-07-01 NOTE — Progress Notes (Signed)
Patient ID: Terri Sparks, female   DOB: 01-15-1976, 41 y.o.   MRN: [id number]  Reason for Consult: Follow-up (3 mth f/u with ABI's, L LE arterial duplex. )   Referred by Ladell Pier, MD  Subjective:     HPI:  Terri Sparks is a 41 y.o. female previously underwent left lower extremity femoral embolectomy and stent of her left common iliac artery for acute limb ischemia. She was last seen in follow-up in June and was still having some minimal drainage from her left groin incision and also pain in her foot. She now has had complete healing of her wounds but continues to have burning pain in her left foot occasionally. She is able to walk without much limitation she does not have calf cramping or rest pain but only complains of hurting. She states that her diabetes is much better controlled. She takes aspirin and Plavix at this time and she is only occasionally smoking. She does not have any tissue loss or ulceration.  Past Medical History:  Diagnosis Date  . Anxiety   . Diabetes ([place])   . GERD (gastroesophageal reflux disease)   . History of blood clots   . Hypercalcemia   . Hyperparathyroidism   . Nephrolithiasis   . Renal calculi    Family History  Problem Relation Age of Onset  . Depression Mother   . Diabetes Father   . Heart failure Father    Past Surgical History:  Procedure Laterality Date  . EMBOLECTOMY Left 02/24/2017   Procedure: Left Lower Extremity Embolectomy and Angiogram.;  Surgeon: Waynetta Sandy, MD;  Location: [place];  Service: Vascular;  Laterality: Left;  . INSERTION OF ILIAC STENT  02/24/2017   Procedure: INSERTION OF Common ILIAC STENT;  Surgeon: Waynetta Sandy, MD;  Location: [place];  Service: Vascular;;  . LOWER EXTREMITY ANGIOGRAM Left 02/24/2017   Procedure: Aortagram, Left lower extremity Run-off;  Surgeon: Waynetta Sandy, MD;  Location: [place];  Service: Vascular;  Laterality: Left;  . removal of parathyroid  adenoma  5/11  . TUBAL LIGATION      Short Social History:  Social History  Substance Use Topics  . Smoking status: Former Smoker    Years: 15.00    Types: Cigarettes    Quit date: 03/04/2017  . Smokeless tobacco: Never Used  . Alcohol use No    Allergies  Allergen Reactions  . Ciprofloxacin Hcl Hives  . Macrobid [Nitrofurantoin Macrocrystal] Hives, Shortness Of Breath and Rash  . Other Hives, Shortness Of Breath and Rash    NO "-CILLINS"!!!  . Penicillins Shortness Of Breath    Had had cephalosporins without incident  . Sulfa Antibiotics Hives, Shortness Of Breath and Rash    Current Outpatient Prescriptions  Medication Sig Dispense Refill  . blood glucose meter kit and supplies Dispense based on patient and insurance preference. Use up to four times daily as directed. (FOR ICD-9 250.00, 250.01). 1 each 0  . clonazePAM (KLONOPIN) 0.5 MG tablet Take 1 tablet (0.5 mg total) by mouth 3 (three) times daily as needed (anxiety). 40 tablet 0  . clopidogrel (PLAVIX) 75 MG tablet Take 1 tablet (75 mg total) by mouth daily. 30 tablet 3  . escitalopram (LEXAPRO) 10 MG tablet Take 1 tablet (10 mg total) by mouth daily. 30 tablet 4  . gabapentin (NEURONTIN) 300 MG capsule Take 1 capsule (300 mg total) by mouth 2 (two) times daily. 60 capsule 3  . glucose blood test  strip Use as instructed 100 each 0  . ibuprofen (ADVIL,MOTRIN) 200 MG tablet Take 400 mg by mouth every 6 (six) hours as needed (for pain or headaches).     . insulin aspart (NOVOLOG) 100 UNIT/ML injection 5-15 units subcut TID with meals 10 mL 11  . insulin glargine (LANTUS) 100 UNIT/ML injection Inject 0.4 mLs (40 Units total) into the skin daily. 10 mL 11  . Insulin Pen Needle 31G X 5 MM MISC Use with Lantus and Novolog injections. 100 each 11  . Insulin Syringe-Needle U-100 (BD INSULIN SYRINGE ULTRAFINE) 31G X 5/16" 0.5 ML MISC Use as directed 100 each 3  . metFORMIN (GLUCOPHAGE) 500 MG tablet Take 1 tablet (500 mg total) by  mouth 2 (two) times daily with a meal. 180 tablet 3  . Multiple Vitamins-Calcium (ONE-A-DAY WOMENS FORMULA) TABS Take 1 tablet by mouth daily.    . nicotine (NICODERM CQ - DOSED IN MG/24 HOURS) 14 mg/24hr patch Place 1 patch (14 mg total) onto the skin daily. 28 patch 1   No current facility-administered medications for this visit.     Review of Systems  Constitutional:  Constitutional negative. HENT: HENT negative.  Eyes: Eyes negative.  Cardiovascular: Positive for leg swelling.  Musculoskeletal: Musculoskeletal negative.  Skin: Skin negative.  Neurological: Positive for numbness.  Hematologic: Hematologic/lymphatic negative.  Psychiatric: Positive for depressed mood.        Objective:  Objective   Vitals:   07/01/17 1211  BP: (!) 158/88  Pulse: (!) 114  Resp: 20  Temp: (!) 97.1 F (36.2 C)  TempSrc: Oral  SpO2: 100%  Weight: 281 lb (127.5 kg)  Height: '5\' 7"'$  (1.702 m)   Body mass index is 44.01 kg/m.  Physical Exam  Constitutional: She is oriented to person, place, and time. She appears well-developed.  HENT:  Head: Normocephalic.  Eyes: Pupils are equal, round, and reactive to light.  Neck: Normal range of motion.  Cardiovascular: Normal rate.   Strong biphasic left dp signal  Pulmonary/Chest: Effort normal.  Abdominal: Soft.  Musculoskeletal: Normal range of motion. She exhibits no edema.  Neurological: She is alert and oriented to person, place, and time.  Skin: Skin is warm and dry.  Psychiatric: She has a normal mood and affect. Her behavior is normal. Judgment and thought content normal.    Data: I independently interpreted her left lower extremity duplex that showed a possible anterior tibial artery stenosis of 30-49%. ABIs today 1.1 on right 0.83 on left with biphasic posterior tibial signal along the left side and a monophasic dorsalis pedis.     Assessment/Plan:     41 year old female follows up from left lower extremity thromboembolectomy iliac  stenting on the left. Her wounds have all healed now she states that her blood sugar is better controlled. She remains on aspirin and Plavix at this time. It will be very difficult to evaluate her iliac artery stent given her size. She does have adequate blood flow to her foot and is asymptomatic at this time I would not proceed with any evaluation although if her ABIs worsen she should possibly be considered for aortogram with left lower extremity runoff. She will continue on aspirin and Plavix and will have her follow-up in 9 months with repeat ABIs. Ideally she will get duplexes but this as a cost issue for her and I would rather her follow-up and we can make determinations based on her ABIs that do have acceptable waveforms today. She demonstrates good understanding and  we'll see her in 9 months which will put her 1 year out from her procedure.     Waynetta Sandy MD [facility]

## 2017-07-06 NOTE — Addendum Note (Signed)
Addended by: Lianne Cure A on: 07/06/2017 10:40 AM   Modules accepted: Orders

## 2017-07-08 MED FILL — GABAPENTIN 300 MG CAPSULE: 300 | 30 days supply | Qty: 60 | Fill #2

## 2017-07-08 MED FILL — ?METFORMIN HCL 500MG TABLET: 500 | 30 days supply | Qty: 60 | Fill #2

## 2017-07-08 MED FILL — ?CLOPIDOGREL 75MG TAB: 75 | 30 days supply | Qty: 30 | Fill #2

## 2017-07-08 MED FILL — ESCITALOPRAM 10 MG TABLET: 10 | 30 days supply | Qty: 30 | Fill #2

## 2017-07-26 DIAGNOSIS — Z0279 Encounter for issue of other medical certificate: Secondary | ICD-10-CM | POA: Diagnosis not present

## 2017-07-31 ENCOUNTER — Other Ambulatory Visit: Payer: Self-pay | Admitting: Internal Medicine

## 2017-08-01 ENCOUNTER — Encounter: Payer: Self-pay | Admitting: Internal Medicine

## 2017-08-01 ENCOUNTER — Other Ambulatory Visit (HOSPITAL_COMMUNITY)
Admission: RE | Admit: 2017-08-01 | Discharge: 2017-08-01 | Disposition: A | Discharge status: Home / Self Care | Payer: PRIVATE HEALTH INSURANCE | Source: Ambulatory Visit | Attending: Internal Medicine | Admitting: Internal Medicine

## 2017-08-01 ENCOUNTER — Ambulatory Visit: Payer: Self-pay | Attending: Internal Medicine | Admitting: Internal Medicine

## 2017-08-01 VITALS — BP 134/84 | HR 91 | Resp 16 | Wt 281.0 lb

## 2017-08-01 DIAGNOSIS — E1141 Type 2 diabetes mellitus with diabetic mononeuropathy: Secondary | ICD-10-CM | POA: Insufficient documentation

## 2017-08-01 DIAGNOSIS — Z01419 Encounter for gynecological examination (general) (routine) without abnormal findings: Secondary | ICD-10-CM | POA: Insufficient documentation

## 2017-08-01 DIAGNOSIS — F329 Major depressive disorder, single episode, unspecified: Secondary | ICD-10-CM | POA: Insufficient documentation

## 2017-08-01 DIAGNOSIS — Z9851 Tubal ligation status: Secondary | ICD-10-CM | POA: Insufficient documentation

## 2017-08-01 DIAGNOSIS — Z833 Family history of diabetes mellitus: Secondary | ICD-10-CM | POA: Insufficient documentation

## 2017-08-01 DIAGNOSIS — Z124 Encounter for screening for malignant neoplasm of cervix: Secondary | ICD-10-CM

## 2017-08-01 DIAGNOSIS — E1165 Type 2 diabetes mellitus with hyperglycemia: Secondary | ICD-10-CM

## 2017-08-01 DIAGNOSIS — G5792 Unspecified mononeuropathy of left lower limb: Secondary | ICD-10-CM

## 2017-08-01 DIAGNOSIS — IMO0001 Reserved for inherently not codable concepts without codable children: Secondary | ICD-10-CM

## 2017-08-01 DIAGNOSIS — K219 Gastro-esophageal reflux disease without esophagitis: Secondary | ICD-10-CM | POA: Insufficient documentation

## 2017-08-01 DIAGNOSIS — Z8249 Family history of ischemic heart disease and other diseases of the circulatory system: Secondary | ICD-10-CM | POA: Insufficient documentation

## 2017-08-01 DIAGNOSIS — F419 Anxiety disorder, unspecified: Secondary | ICD-10-CM | POA: Insufficient documentation

## 2017-08-01 DIAGNOSIS — Z23 Encounter for immunization: Secondary | ICD-10-CM | POA: Insufficient documentation

## 2017-08-01 DIAGNOSIS — Z794 Long term (current) use of insulin: Secondary | ICD-10-CM | POA: Insufficient documentation

## 2017-08-01 DIAGNOSIS — Z9889 Other specified postprocedural states: Secondary | ICD-10-CM | POA: Insufficient documentation

## 2017-08-01 DIAGNOSIS — Z87891 Personal history of nicotine dependence: Secondary | ICD-10-CM | POA: Insufficient documentation

## 2017-08-01 LAB — POCT URINALYSIS DIPSTICK
Bilirubin, UA: NEGATIVE
Glucose, UA: 500
Ketones, UA: NEGATIVE
Nitrite, UA: NEGATIVE
Protein, UA: NEGATIVE
Spec Grav, UA: 1.01 (ref 1.010–1.025)
Urobilinogen, UA: 0.2 E.U./dL
pH, UA: 6 (ref 5.0–8.0)

## 2017-08-01 LAB — GLUCOSE, POCT (MANUAL RESULT ENTRY): POC Glucose: 448 mg/dl — AB (ref 70–99)

## 2017-08-01 LAB — POCT GLYCOSYLATED HEMOGLOBIN (HGB A1C): Hemoglobin A1C: 11.6

## 2017-08-01 MED ORDER — INSULIN ASPART 100 UNIT/ML ~~LOC~~ SOLN
15.0000 [IU] | Freq: Once | SUBCUTANEOUS | Status: AC
  Administered 2017-08-01: 15 [IU] via SUBCUTANEOUS

## 2017-08-01 MED ORDER — METFORMIN HCL 500 MG PO TABS: 500.0000 mg | ORAL_TABLET | Freq: Two times a day (BID) | ORAL | 3 refills | Status: DC

## 2017-08-01 MED ORDER — INSULIN GLARGINE 100 UNIT/ML ~~LOC~~ SOLN: 50.0000 [IU] | Freq: Every day | SUBCUTANEOUS | 11 refills | Status: DC

## 2017-08-01 MED ORDER — GLUCOSE BLOOD VI STRP: ORAL_STRIP | 12 refills | Status: DC

## 2017-08-01 MED ORDER — GABAPENTIN 300 MG PO CAPS: 300.0000 mg | ORAL_CAPSULE | Freq: Two times a day (BID) | ORAL | 3 refills | Status: DC

## 2017-08-01 MED ORDER — HYDROXYZINE HCL 25 MG PO TABS: 25.0000 mg | ORAL_TABLET | Freq: Every evening | ORAL | 0 refills | Status: DC | PRN

## 2017-08-01 MED ORDER — INSULIN GLARGINE 100 UNIT/ML ~~LOC~~ SOLN: 60.0000 [IU] | Freq: Every day | SUBCUTANEOUS | 11 refills | Status: DC

## 2017-08-01 MED ORDER — OMEPRAZOLE 20 MG PO CPDR: 20.0000 mg | DELAYED_RELEASE_CAPSULE | Freq: Every day | ORAL | 3 refills | Status: DC

## 2017-08-01 MED ORDER — TRUE METRIX METER W/DEVICE KIT: PACK | 0 refills | Status: DC

## 2017-08-01 MED ORDER — TRUEPLUS LANCETS 28G MISC: 6 refills | Status: DC

## 2017-08-01 MED FILL — GABAPENTIN 300 MG CAPSULE: 300 | 30 days supply | Qty: 60 | Fill #0

## 2017-08-01 MED FILL — ?OMEPRAZOLE DR 20 MG CAPSUL: 20 | 30 days supply | Qty: 30 | Fill #0

## 2017-08-01 MED FILL — TRUEplus LANCETS 28G MISC: 25 days supply | Qty: 100 | Fill #0

## 2017-08-01 MED FILL — hydrOXYzine HCL 25 MG TABS: 25 | 30 days supply | Qty: 30 | Fill #0

## 2017-08-01 MED FILL — !NOVOLOG 100UNITS/ML VIAL: 100/ML | 28 days supply | Qty: 10 | Fill #1

## 2017-08-01 MED FILL — TRUEPLUS SYR 1ML 31GX5/16": 31G X 5/16" | 30 days supply | Qty: 100 | Fill #1

## 2017-08-01 MED FILL — !TRUE METRIX BLOOD GLUCOSE: 365 days supply | Qty: 1 | Fill #0

## 2017-08-01 MED FILL — TRUE METRIX TEST STRIP: 25 days supply | Qty: 100 | Fill #0

## 2017-08-01 MED FILL — TRUEPLUS SYR 1ML 31GX5/16: 31G X 5/16" | 30 days supply | Qty: 100 | Fill #1

## 2017-08-01 MED FILL — metFORMIN HCL 500 MG TABS: 500 | 30 days supply | Qty: 60 | Fill #0

## 2017-08-01 MED FILL — !LANTUS 100 UNITS/ML VIAL: 100 | 16 days supply | Qty: 10 | Fill #0

## 2017-08-01 NOTE — Progress Notes (Signed)
Patient ID: Terri Sparks, female    DOB: [birth year]  MRN: [id number]  CC: Gynecologic Exam and Medication Refill   Subjective: Terri Sparks is a 41 y.o. female who presents for recheck on diabetes and to have Pap done. Her daughter, Loletha Grayer, is with her  Her concerns today include:  41 year old female with history of diabetes type 2, tobacco dependence, peripheral vascular disease with recent left lower extremity acute limb ischemia requiring stent to the iliac artery and embolectomy followed by Dr. Donzetta Matters of vascular surgery, hyperparathyroidism s/p resection of parathryroid adenoma 2010, GERD, and anxiety  DM: Not careful with diet. Limited finances so she eats what she can afford -not checking BS, can not afford stripes. Would like to get glucometer and stripes here which she thinks will be cheaper for her -Med: compliant with Lantus taking 50 units but Novolog she takes once a day most of the times.  She stopped Metformin 3 wks ago because she heard on social media that it can cause a flesh eating bacteria  PAP: no abnormals in the past -last Pap was 3-5 yrs -no vaginal dischg or itching at this time. Requests check for STDs  Anxiety/Depression: Lexapro helps but keeps her awake at night even though she takes it in the morning. Requesting something to help with sleep and referral to psychiatrist.   GERD: a lot at nights. Took a friend's omeprazole and found it to be helpful. Request a prescription for the same  Neuropathy LT leg: would like higher dose of gabapentin. Symptom and left leg is worse with weather change. She reports taking 100 mg 3 times a day but actual prescription was written for 300 mg TID   Patient Active Problem List   Diagnosis Date Noted  . Immunization due 08/01/2017  . Anxiety and depression 04/28/2017  . 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 Iliac artery occlusion, left ([place]) 02/25/2017  . Tobacco abuse 02/25/2017  . Diabetes mellitus, type  II ([place]) 02/25/2017  . Peripheral vascular disease of lower extremity ([place]) 02/24/2017  . GERD (gastroesophageal reflux disease)   . Hyperparathyroidism      Current Outpatient Prescriptions on File Prior to Visit  Medication Sig Dispense Refill  . blood glucose meter kit and supplies Dispense based on patient and insurance preference. Use up to four times daily as directed. (FOR ICD-9 250.00, 250.01). 1 each 0  . clopidogrel (PLAVIX) 75 MG tablet Take 1 tablet (75 mg total) by mouth daily. 30 tablet 3  . escitalopram (LEXAPRO) 10 MG tablet Take 1 tablet (10 mg total) by mouth daily. 30 tablet 4  . glucose blood test strip Use as instructed 100 each 0  . ibuprofen (ADVIL,MOTRIN) 200 MG tablet Take 400 mg by mouth every 6 (six) hours as needed (for pain or headaches).     . insulin aspart (NOVOLOG) 100 UNIT/ML injection 5-15 units subcut TID with meals 10 mL 11  . Insulin Pen Needle 31G X 5 MM MISC Use with Lantus and Novolog injections. 100 each 11  . Insulin Syringe-Needle U-100 (BD INSULIN SYRINGE ULTRAFINE) 31G X 5/16" 0.5 ML MISC Use as directed 100 each 3  . Multiple Vitamins-Calcium (ONE-A-DAY WOMENS FORMULA) TABS Take 1 tablet by mouth daily.    . nicotine (NICODERM CQ - DOSED IN MG/24 HOURS) 14 mg/24hr patch Place 1 patch (14 mg total) onto the skin daily. 28 patch 1   No current facility-administered medications on file prior to visit.  Allergies  Allergen Reactions  . Ciprofloxacin Hcl Hives  . Macrobid [Nitrofurantoin Macrocrystal] Hives, Shortness Of Breath and Rash  . Other Hives, Shortness Of Breath and Rash    NO "-CILLINS"!!!  . Penicillins Shortness Of Breath    Had had cephalosporins without incident  . Sulfa Antibiotics Hives, Shortness Of Breath and Rash    Social History   Social History  . Marital status: Single    Spouse name: Terri Sparks  . Number of children: Terri Sparks  . Years of education: Terri Sparks   Occupational History  . Not on file.   Social History Main  Topics  . Smoking status: Former Smoker    Years: 15.00    Types: Cigarettes    Quit date: 03/04/2017  . Smokeless tobacco: Never Used  . Alcohol use No  . Drug use: Yes    Types: Marijuana     Comment: occasionally  . Sexual activity: Not on file   Other Topics Concern  . Not on file   Social History Narrative  . No narrative on file    Family History  Problem Relation Age of Onset  . Depression Mother   . Diabetes Father   . Heart failure Father     Past Surgical History:  Procedure Laterality Date  . EMBOLECTOMY Left 02/24/2017   Procedure: Left Lower Extremity Embolectomy and Angiogram.;  Surgeon: Waynetta Sandy, MD;  Location: [place];  Service: Vascular;  Laterality: Left;  . INSERTION OF ILIAC STENT  02/24/2017   Procedure: INSERTION OF Common ILIAC STENT;  Surgeon: Waynetta Sandy, MD;  Location: [place];  Service: Vascular;;  . LOWER EXTREMITY ANGIOGRAM Left 02/24/2017   Procedure: Aortagram, Left lower extremity Run-off;  Surgeon: Waynetta Sandy, MD;  Location: [place];  Service: Vascular;  Laterality: Left;  . 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 TUBAL LIGATION      ROS: Review of Systems Negative except as stated above PHYSICAL EXAM: BP 134/84   Pulse 91   Resp 16   Wt 281 lb (127.5 kg)   SpO2 100%   BMI 44.01 kg/m   Physical Exam General appearance - alert, well appearing, and in no distress Mental status - alert, oriented to person, place, and time, normal mood, behavior, speech, dress, motor activity, and thought processes Pelvic - CMA, Boykin Reaper present. normal external genitalia, vulva, vagina, cervix, uterus and adnexa Extremities - peripheral pulses normal, no pedal edema, no clubbing or cyanosis  Lab Results  Component Value Date   HGBA1C 11.6 08/01/2017   BS today 448/ A1C 11.6 ASSESSMENT AND PLAN: 1. Uncontrolled type 2 diabetes mellitus without complication, with long-term current use of insulin  ([place]) Discussed the importance of healthy eating habits, regular aerobic exercise (at least 150 minutes a week as tolerated) and medication compliance to achieve or maintain control of diabetes. -Increase Lantus to 60 units daily. Patient advised to restart metformin.  Recommend NovoLog 10 units twice a day with the 2 largest meals of the day Prescription written for her to get her diabetic supplies at [facility] in one month with clinical pharmacists and bring blood sugar readings with her  - Microalbumin/Creatinine Ratio, Urine - POCT glucose (manual entry) - POCT glycosylated hemoglobin (Hb A1C) - POCT urinalysis dipstick - insulin aspart (novoLOG) injection 15 Units; Inject 0.15 mLs (15 Units total) into the skin once. - metFORMIN (GLUCOPHAGE) 500 MG tablet; Take 1 tablet (500 mg total) by mouth 2 (two) times daily  with a meal.  Dispense: 180 tablet; Refill: 3 - Blood Glucose Monitoring Suppl (TRUE METRIX METER) w/Device KIT; Use as directed  Dispense: 1 kit; Refill: 0 - glucose blood (TRUE METRIX BLOOD GLUCOSE TEST) test strip; Use as instructed  Dispense: 100 each; Refill: 12 - TRUEPLUS LANCETS 28G MISC; Use as directed  Dispense: 100 each; Refill: 6 - insulin glargine (LANTUS) 100 UNIT/ML injection; Inject 0.6 mLs (60 Units total) into the skin daily.  Dispense: 10 mL; Refill: 11  2. Neuropathy of left lower extremity - gabapentin (NEURONTIN) 300 MG capsule; Take 1 capsule (300 mg total) by mouth 2 (two) times daily.  Dispense: 60 capsule; Refill: 3  3. Pap smear for cervical cancer screening - Cytology - PAP  4. Anxiety and depression -Good sleep hygiene discussed. Start low-dose hydroxyzine to use when necessary at bedtime. Advised to establish with behavioral health specialist at [facility] or family services of the [place] Continue Lexapro - hydrOXYzine (ATARAX/VISTARIL) 25 MG tablet; Take 1 tablet (25 mg total) by mouth at bedtime as needed for anxiety.  Dispense:  30 tablet; Refill: 0  5. Gastroesophageal reflux disease without esophagitis -GERD precautions discussed - omeprazole (PRILOSEC) 20 MG capsule; Take 1 capsule (20 mg total) by mouth daily.  Dispense: 30 capsule; Refill: 3  6. Immunization due - Flu Vaccine QUAD 6+ mos PF IM (Fluarix Quad PF) Pneumovax and Tdap  Patient was given the opportunity to ask questions.  Patient verbalized understanding of the plan and was able to repeat key elements of the plan.   Orders Placed This Encounter  Procedures  . Pneumococcal polysaccharide vaccine 23-valent greater than or equal to 2yo subcutaneous/IM  . Tdap vaccine greater than or equal to 7yo IM  . Flu Vaccine QUAD 6+ mos PF IM (Fluarix Quad PF)  . Microalbumin/Creatinine Ratio, Urine  . POCT glucose (manual entry)  . POCT glycosylated hemoglobin (Hb A1C)  . POCT urinalysis dipstick     Requested Prescriptions   Signed Prescriptions Disp Refills  . hydrOXYzine (ATARAX/VISTARIL) 25 MG tablet 30 tablet 0    Sig: Take 1 tablet (25 mg total) by mouth at bedtime as needed for anxiety.  Marland Kitchen omeprazole (PRILOSEC) 20 MG capsule 30 capsule 3    Sig: Take 1 capsule (20 mg total) by mouth daily.  Marland Kitchen gabapentin (NEURONTIN) 300 MG capsule 60 capsule 3    Sig: Take 1 capsule (300 mg total) by mouth 2 (two) times daily.  . metFORMIN (GLUCOPHAGE) 500 MG tablet 180 tablet 3    Sig: Take 1 tablet (500 mg total) by mouth 2 (two) times daily with a meal.  . Blood Glucose Monitoring Suppl (TRUE METRIX METER) w/Device KIT 1 kit 0    Sig: Use as directed  . glucose blood (TRUE METRIX BLOOD GLUCOSE TEST) test strip 100 each 12    Sig: Use as instructed  . TRUEPLUS LANCETS 28G MISC 100 each 6    Sig: Use as directed  . insulin glargine (LANTUS) 100 UNIT/ML injection 10 mL 11    Sig: Inject 0.5 mLs (50 Units total) into the skin daily.    Return in about 2 months (around 10/01/2017).  Karle Plumber, MD, FACP

## 2017-08-01 NOTE — Patient Instructions (Addendum)
Give follow-up appointment with Stacy in 1 month for diabetes check.  Your blood sugar is not controlled. He should restart metformin and take twice a day as prescribed. Take NovoLog 10 units with the 2 largest meals of the day.  Start omeprazole for acid reflux. Try to each her last meal at least 2-3 hours before laying down at nights and sleep with your head elevated. Tried to avoid foods that cause acid reflux as discussed today.     Td Vaccine (Tetanus and Diphtheria): What You Need to Know 1. Why get vaccinated? Tetanus  and diphtheria are very serious diseases. They are rare in the [place] today, but people who do become infected often have severe complications. Td vaccine is used to protect adolescents and adults from both of these diseases. Both tetanus and diphtheria are infections caused by bacteria. Diphtheria spreads from person to person through coughing or sneezing. Tetanus-causing bacteria enter the body through cuts, scratches, or wounds. TETANUS (lockjaw) causes painful muscle tightening and stiffness, usually all over the body.  It can lead to tightening of muscles in the head and neck so you can't open your mouth, swallow, or sometimes even breathe. Tetanus kills about 1 out of every 10 people who are infected even after receiving the best medical care.  DIPHTHERIA can cause a thick coating to form in the back of the throat.  It can lead to breathing problems, paralysis, heart failure, and death.  Before vaccines, as many as 200,000 cases of diphtheria and hundreds of cases of tetanus were reported in the [place] each year. Since vaccination began, reports of cases for both diseases have dropped by about 99%. 2. Td vaccine Td vaccine can protect adolescents and adults from tetanus and diphtheria. Td is usually given as a booster dose every 10 years but it can also be given earlier after a severe and dirty wound or burn. Another vaccine, called Tdap, which  protects against pertussis in addition to tetanus and diphtheria, is sometimes recommended instead of Td vaccine. Your doctor or the person giving you the vaccine can give you more information. Td may safely be given at the same time as other vaccines. 3. Some people should not get this vaccine  A person who has ever had a life-threatening allergic reaction after a previous dose of any tetanus or diphtheria containing vaccine, OR has a severe allergy to any part of this vaccine, should not get Td vaccine. Tell the person giving the vaccine about any severe allergies.  Talk to your doctor if you: ? had severe pain or swelling after any vaccine containing diphtheria or tetanus, ? ever had a condition called Guillain Barre Syndrome (GBS), ? aren't feeling well on the day the shot is scheduled. 4. What are the risks from Td vaccine? With any medicine, including vaccines, there is a chance of side effects. These are usually mild and go away on their own. Serious reactions are also possible but are rare. Most people who get Td vaccine do not have any problems with it. Mild problems following Td vaccine: (Did not interfere with activities)  Pain where the shot was given (about 8 people in 10)  Redness or swelling where the shot was given (about 1 person in 4)  Mild fever (rare)  Headache (about 1 person in 4)  Tiredness (about 1 person in 4)  Moderate problems following Td vaccine: (Interfered with activities, but did not require medical attention)  Fever over 102F (rare)  Severe problems  following Td vaccine: (Unable to perform usual activities; required medical attention)  Swelling, severe pain, bleeding and/or redness in the arm where the shot was given (rare).  Problems that could happen after any vaccine:  People sometimes faint after a medical procedure, including vaccination. Sitting or lying down for about 15 minutes can help prevent fainting, and injuries caused by a fall.  Tell your doctor if you feel dizzy, or have vision changes or ringing in the ears.  Some people get severe pain in the shoulder and have difficulty moving the arm where a shot was given. This happens very rarely.  Any medication can cause a severe allergic reaction. Such reactions from a vaccine are very rare, estimated at fewer than 1 in a million doses, and would happen within a few minutes to a few hours after the vaccination. As with any medicine, there is a very remote chance of a vaccine causing a serious injury or death. The safety of vaccines is always being monitored. For more information, visit: http://www.aguilar.org/ 5. What if there is a serious reaction? What should I look for? Look for anything that concerns you, such as signs of a severe allergic reaction, very high fever, or unusual behavior. Signs of a severe allergic reaction can include hives, swelling of the face and throat, difficulty breathing, a fast heartbeat, dizziness, and weakness. These would usually start a few minutes to a few hours after the vaccination. What should I do?  If you think it is a severe allergic reaction or other emergency that can't wait, call 9-1-1 or get the person to the nearest hospital. Otherwise, call your doctor.  Afterward, the reaction should be reported to the Vaccine Adverse Event Reporting System (VAERS). Your doctor might file this report, or you can do it yourself through the VAERS web site at www.vaers.SamedayNews.es, or by calling (807)627-3394. ? VAERS does not give medical advice. 6. The National Vaccine Injury Compensation Program The [employer] Vaccine Injury Compensation Program (VICP) is a federal program that was created to compensate people who may have been injured by certain vaccines. Persons who believe they may have been injured by a vaccine can learn about the program and about filing a claim by calling 954-239-2586 or visiting the [employer] website at  GoldCloset.com.ee. There is a time limit to file a claim for compensation. 7. How can I learn more?  Ask your doctor. He or she can give you the vaccine package insert or suggest other sources of information.  Call your local or state health department.  Contact the Centers for Disease Control and Prevention (CDC): ? Call (908)608-9445 (1-800-CDC-INFO) ? Visit CDC's website at http://hunter.com/ CDC Td Vaccine VIS (01/13/16) This information is not intended to replace advice given to you by your health care provider. Make sure you discuss any questions you have with your health care provider. Document Released: 07/18/2006 Document Revised: 06/10/2016 Document Reviewed: 06/10/2016 Elsevier Interactive Patient Education  2017 [place].  Pneumococcal Polysaccharide Vaccine: What You Need to Know 1. Why get vaccinated? Vaccination can protect older adults (and some children and younger adults) from pneumococcal disease. Pneumococcal disease is caused by bacteria that can spread from person to person through close contact. It can cause ear infections, and it can also lead to more serious infections of the:  Lungs (pneumonia),  Blood (bacteremia), and  Covering of the brain and spinal cord (meningitis). Meningitis can cause deafness and brain damage, and it can be fatal.  Anyone can get pneumococcal disease, but children  under 57 years of age, people with certain medical conditions, adults over 53 years of age, and cigarette smokers are at the highest risk. About 18,000 older adults die each year from pneumococcal disease in the [place]. Treatment of pneumococcal infections with penicillin and other drugs used to be more effective. But some strains of the disease have become resistant to these drugs. This makes prevention of the disease, through vaccination, even more important. 2. Pneumococcal polysaccharide vaccine (PPSV23) Pneumococcal polysaccharide vaccine  (PPSV23) protects against 23 types of pneumococcal bacteria. It will not prevent all pneumococcal disease. PPSV23 is recommended for:  All adults 56 years of age and older,  Anyone 2 through 42 years of age with certain long-term health problems,  Anyone 2 through 42 years of age with a weakened immune system,  Adults 35 through 42 years of age who smoke cigarettes or have asthma.  Most people need only one dose of PPSV. A second dose is recommended for certain high-risk groups. People 31 and older should get a dose even if they have gotten one or more doses of the vaccine before they turned 65. Your healthcare provider can give you more information about these recommendations. Most healthy adults develop protection within 2 to 3 weeks of getting the shot. 3. Some people should not get this vaccine  Anyone who has had a life-threatening allergic reaction to PPSV should not get another dose.  Anyone who has a severe allergy to any component of PPSV should not receive it. Tell your provider if you have any severe allergies.  Anyone who is moderately or severely ill when the shot is scheduled may be asked to wait until they recover before getting the vaccine. Someone with a mild illness can usually be vaccinated.  Children less than 58 years of age should not receive this vaccine.  There is no evidence that PPSV is harmful to either a pregnant woman or to her fetus. However, as a precaution, women who need the vaccine should be vaccinated before becoming pregnant, if possible. 4. Risks of a vaccine reaction With any medicine, including vaccines, there is a chance of side effects. These are usually mild and go away on their own, but serious reactions are also possible. About half of people who get PPSV have mild side effects, such as redness or pain where the shot is given, which go away within about two days. Less than 1 out of 100 people develop a fever, muscle aches, or more severe local  reactions. Problems that could happen after any vaccine:  People sometimes faint after a medical procedure, including vaccination. Sitting or lying down for about 15 minutes can help prevent fainting, and injuries caused by a fall. Tell your doctor if you feel dizzy, or have vision changes or ringing in the ears.  Some people get severe pain in the shoulder and have difficulty moving the arm where a shot was given. This happens very rarely.  Any medication can cause a severe allergic reaction. Such reactions from a vaccine are very rare, estimated at about 1 in a million doses, and would happen within a few minutes to a few hours after the vaccination. As with any medicine, there is a very remote chance of a vaccine causing a serious injury or death. The safety of vaccines is always being monitored. For more information, visit: http://www.aguilar.org/ 5. What if there is a serious reaction? What should I look for? Look for anything that concerns you, such as signs of a  severe allergic reaction, very high fever, or unusual behavior. Signs of a severe allergic reaction can include hives, swelling of the face and throat, difficulty breathing, a fast heartbeat, dizziness, and weakness. These would usually start a few minutes to a few hours after the vaccination. What should I do? If you think it is a severe allergic reaction or other emergency that can't wait, call 9-1-1 or get to the nearest hospital. Otherwise, call your doctor. Afterward, the reaction should be reported to the Vaccine Adverse Event Reporting System (VAERS). Your doctor might file this report, or you can do it yourself through the VAERS web site at www.vaers.SamedayNews.es, or by calling 531-273-8106. VAERS does not give medical advice. 6. How can I learn more?  Ask your doctor. He or she can give you the vaccine package insert or suggest other sources of information.  Call your local or state health department.  Contact the  Centers for Disease Control and Prevention (CDC): ? Call 670-753-6219 (1-800-CDC-INFO) or ? Visit CDC's website at http://hunter.com/ CDC Pneumococcal Polysaccharide Vaccine VIS (01/25/14) This information is not intended to replace advice given to you by your health care provider. Make sure you discuss any questions you have with your health care provider. Document Released: 07/18/2006 Document Revised: 06/10/2016 Document Reviewed: 06/10/2016 Elsevier Interactive Patient Education  2017 [place].  Influenza Virus Vaccine injection (Fluarix) What is this medicine? INFLUENZA VIRUS VACCINE (in floo EN zuh VAHY ruhs vak SEEN) helps to reduce the risk of getting influenza also known as the flu. This medicine may be used for other purposes; ask your health care provider or pharmacist if you have questions. COMMON BRAND NAME(S): Fluarix, Fluzone What should I tell my health care provider before I take this medicine? They need to know if you have any of these conditions: -bleeding disorder like hemophilia -fever or infection -Guillain-Barre syndrome or other neurological problems -immune system problems -infection with the human immunodeficiency virus (HIV) or AIDS -low blood platelet counts -multiple sclerosis -an unusual or allergic reaction to influenza virus vaccine, eggs, chicken proteins, latex, gentamicin, other medicines, foods, dyes or preservatives -pregnant or trying to get pregnant -breast-feeding How should I use this medicine? This vaccine is for injection into a muscle. It is given by a health care professional. A copy of Vaccine Information Statements will be given before each vaccination. Read this sheet carefully each time. The sheet may change frequently. Talk to your pediatrician regarding the use of this medicine in children. Special care may be needed. Overdosage: If you think you have taken too much of this medicine contact a poison control center or emergency  room at once. NOTE: This medicine is only for you. Do not share this medicine with others. What if I miss a dose? This does not apply. What may interact with this medicine? -chemotherapy or radiation therapy -medicines that lower your immune system like etanercept, anakinra, infliximab, and adalimumab -medicines that treat or prevent blood clots like warfarin -phenytoin -steroid medicines like prednisone or cortisone -theophylline -vaccines This list may not describe all possible interactions. Give your health care provider a list of all the medicines, herbs, non-prescription drugs, or dietary supplements you use. Also tell them if you smoke, drink alcohol, or use illegal drugs. Some items may interact with your medicine. What should I watch for while using this medicine? Report any side effects that do not go away within 3 days to your doctor or health care professional. Call your health care provider if any unusual symptoms  occur within 6 weeks of receiving this vaccine. You may still catch the flu, but the illness is not usually as bad. You cannot get the flu from the vaccine. The vaccine will not protect against colds or other illnesses that may cause fever. The vaccine is needed every year. What side effects may I notice from receiving this medicine? Side effects that you should report to your doctor or health care professional as soon as possible: -allergic reactions like skin rash, itching or hives, swelling of the face, lips, or tongue Side effects that usually do not require medical attention (report to your doctor or health care professional if they continue or are bothersome): -fever -headache -muscle aches and pains -pain, tenderness, redness, or swelling at site where injected -weak or tired This list may not describe all possible side effects. Call your doctor for medical advice about side effects. You may report side effects to FDA at 1-800-FDA-1088. Where should I keep my  medicine? This vaccine is only given in a clinic, pharmacy, doctor's office, or other health care setting and will not be stored at home. NOTE: This sheet is a summary. It may not cover all possible information. If you have questions about this medicine, talk to your doctor, pharmacist, or health care provider.  2018 Elsevier/Gold Standard (2008-04-17 09:30:40)

## 2017-08-02 LAB — CYTOLOGY - PAP
Bacterial vaginitis: NEGATIVE
Candida vaginitis: NEGATIVE
Chlamydia: NEGATIVE
Diagnosis: NEGATIVE
HPV: NOT DETECTED
Neisseria Gonorrhea: NEGATIVE
Trichomonas: NEGATIVE

## 2017-08-02 LAB — MICROALBUMIN / CREATININE URINE RATIO
Creatinine, Urine: 52.5 mg/dL
Microalb/Creat Ratio: 73.5 mg/g creat — ABNORMAL HIGH (ref 0.0–30.0)
Microalbumin, Urine: 38.6 ug/mL

## 2017-08-03 ENCOUNTER — Other Ambulatory Visit: Payer: Self-pay | Admitting: Internal Medicine

## 2017-08-03 MED ORDER — LISINOPRIL 5 MG PO TABS: 2.5000 mg | ORAL_TABLET | Freq: Every day | ORAL | 3 refills | Status: DC

## 2017-08-03 NOTE — Progress Notes (Signed)
Start Lisinopril.

## 2017-08-09 ENCOUNTER — Telehealth: Payer: Self-pay

## 2017-08-09 NOTE — Telephone Encounter (Signed)
Contacted pt to go over lab results pt didn't answer lvm asking pt to give me a call at her earliest convenience   If pt calls back please give results: Pap smear was normal. Screening test for HPV, chlamydia, gonorrhea and trichomonas were negative. She will need a repeat Pap smear in 3-5 years. 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 I recommend starting a low-dose of a blood pressure medicine called lisinopril which will help protect the kidneys from the effects of diabetes. Prescription sent to pharmacy.

## 2017-08-16 MED FILL — ?CLOPIDOGREL 75MG TAB: 75 | 30 days supply | Qty: 30 | Fill #3

## 2017-08-19 ENCOUNTER — Ambulatory Visit: Payer: PRIVATE HEALTH INSURANCE | Admitting: Vascular Surgery

## 2017-08-30 ENCOUNTER — Other Ambulatory Visit: Payer: Self-pay | Admitting: Internal Medicine

## 2017-08-30 DIAGNOSIS — F419 Anxiety disorder, unspecified: Secondary | ICD-10-CM

## 2017-08-30 DIAGNOSIS — F329 Major depressive disorder, single episode, unspecified: Secondary | ICD-10-CM

## 2017-08-30 MED FILL — TRUEPLUS SYR 1ML 31GX5/16": 31G X 5/16" | 30 days supply | Qty: 100 | Fill #2

## 2017-08-30 MED FILL — GABAPENTIN 300 MG CAPSULE: 300 | 30 days supply | Qty: 60 | Fill #1

## 2017-08-30 MED FILL — hydrOXYzine HCL 25 MG TABS: 25 | 30 days supply | Qty: 30 | Fill #0

## 2017-08-30 MED FILL — TRUEplus LANCETS 28G MISC: 25 days supply | Qty: 100 | Fill #1

## 2017-08-30 MED FILL — ESCITALOPRAM 10 MG TABLET: 10 | 30 days supply | Qty: 30 | Fill #3

## 2017-08-30 MED FILL — !LANTUS 100 UNITS/ML VIAL: 100 | 16 days supply | Qty: 10 | Fill #1

## 2017-08-30 MED FILL — ?METFORMIN HCL 500MG TABLET: 500 | 30 days supply | Qty: 60 | Fill #1

## 2017-08-30 MED FILL — !NOVOLOG 100UNITS/ML VIAL: 100/ML | 28 days supply | Qty: 10 | Fill #2

## 2017-08-30 MED FILL — TRUE METRIX TEST STRIP: 25 days supply | Qty: 100 | Fill #1

## 2017-08-30 MED FILL — TRUEPLUS SYR 1ML 31GX5/16: 31G X 5/16" | 30 days supply | Qty: 100 | Fill #2

## 2017-08-31 DIAGNOSIS — G4700 Insomnia, unspecified: Secondary | ICD-10-CM

## 2017-09-14 ENCOUNTER — Other Ambulatory Visit: Payer: Self-pay | Admitting: Internal Medicine

## 2017-09-14 DIAGNOSIS — I739 Peripheral vascular disease, unspecified: Secondary | ICD-10-CM

## 2017-09-14 MED FILL — ?OMEPRAZOLE DR 20MG CAPSULE: 20 | 30 days supply | Qty: 30 | Fill #1

## 2017-09-15 MED FILL — ?CLOPIDOGREL 75MG TAB: 75 | 30 days supply | Qty: 30 | Fill #0

## 2017-09-20 ENCOUNTER — Other Ambulatory Visit: Payer: Self-pay | Admitting: *Deleted

## 2017-09-20 DIAGNOSIS — I739 Peripheral vascular disease, unspecified: Secondary | ICD-10-CM

## 2017-09-20 MED ORDER — CLOPIDOGREL BISULFATE 75 MG PO TABS: 75.0000 mg | ORAL_TABLET | Freq: Every day | ORAL | 10 refills | Status: DC

## 2017-09-20 MED FILL — ?CLOPIDOGREL 75MG TABLET: 75 | 30 days supply | Qty: 30 | Fill #0

## 2017-09-29 ENCOUNTER — Encounter: Payer: Self-pay | Admitting: Internal Medicine

## 2017-09-29 ENCOUNTER — Ambulatory Visit: Payer: Self-pay | Attending: Internal Medicine | Admitting: Internal Medicine

## 2017-09-29 VITALS — BP 141/84 | HR 102 | Temp 97.9°F | Resp 16 | Wt 276.6 lb

## 2017-09-29 DIAGNOSIS — Z880 Allergy status to penicillin: Secondary | ICD-10-CM | POA: Insufficient documentation

## 2017-09-29 DIAGNOSIS — E1151 Type 2 diabetes mellitus with diabetic peripheral angiopathy without gangrene: Secondary | ICD-10-CM | POA: Insufficient documentation

## 2017-09-29 DIAGNOSIS — G4700 Insomnia, unspecified: Secondary | ICD-10-CM | POA: Insufficient documentation

## 2017-09-29 DIAGNOSIS — F419 Anxiety disorder, unspecified: Secondary | ICD-10-CM | POA: Insufficient documentation

## 2017-09-29 DIAGNOSIS — Z79899 Other long term (current) drug therapy: Secondary | ICD-10-CM | POA: Insufficient documentation

## 2017-09-29 DIAGNOSIS — E118 Type 2 diabetes mellitus with unspecified complications: Secondary | ICD-10-CM

## 2017-09-29 DIAGNOSIS — Z794 Long term (current) use of insulin: Secondary | ICD-10-CM | POA: Insufficient documentation

## 2017-09-29 DIAGNOSIS — E1165 Type 2 diabetes mellitus with hyperglycemia: Secondary | ICD-10-CM | POA: Insufficient documentation

## 2017-09-29 DIAGNOSIS — F329 Major depressive disorder, single episode, unspecified: Secondary | ICD-10-CM | POA: Insufficient documentation

## 2017-09-29 DIAGNOSIS — E213 Hyperparathyroidism, unspecified: Secondary | ICD-10-CM | POA: Insufficient documentation

## 2017-09-29 DIAGNOSIS — F17200 Nicotine dependence, unspecified, uncomplicated: Secondary | ICD-10-CM | POA: Insufficient documentation

## 2017-09-29 DIAGNOSIS — K219 Gastro-esophageal reflux disease without esophagitis: Secondary | ICD-10-CM | POA: Insufficient documentation

## 2017-09-29 DIAGNOSIS — R809 Proteinuria, unspecified: Secondary | ICD-10-CM | POA: Insufficient documentation

## 2017-09-29 DIAGNOSIS — IMO0002 Reserved for concepts with insufficient information to code with codable children: Secondary | ICD-10-CM

## 2017-09-29 LAB — POCT URINALYSIS DIPSTICK
Bilirubin, UA: NEGATIVE
Glucose, UA: 500
Ketones, UA: NEGATIVE
Nitrite, UA: NEGATIVE
Spec Grav, UA: 1.01 (ref 1.010–1.025)
Urobilinogen, UA: 0.2 E.U./dL
pH, UA: 6 (ref 5.0–8.0)

## 2017-09-29 LAB — GLUCOSE, POCT (MANUAL RESULT ENTRY): POC Glucose: 367 mg/dl — AB (ref 70–99)

## 2017-09-29 MED ORDER — METFORMIN HCL ER 500 MG PO TB24: 500.0000 mg | ORAL_TABLET | Freq: Every day | ORAL | 3 refills | Status: DC

## 2017-09-29 MED ORDER — INSULIN ASPART 100 UNIT/ML ~~LOC~~ SOLN
10.0000 [IU] | Freq: Once | SUBCUTANEOUS | Status: AC
  Administered 2017-09-29: 10 [IU] via SUBCUTANEOUS

## 2017-09-29 MED ORDER — HYDROXYZINE HCL 25 MG PO TABS: ORAL_TABLET | ORAL | 2 refills | Status: DC

## 2017-09-29 MED ORDER — OMEPRAZOLE 20 MG PO CPDR: 40.0000 mg | DELAYED_RELEASE_CAPSULE | Freq: Every day | ORAL | 3 refills | Status: DC

## 2017-09-29 MED ORDER — TRAZODONE HCL 50 MG PO TABS: 25.0000 mg | ORAL_TABLET | Freq: Every evening | ORAL | 3 refills | Status: DC | PRN

## 2017-09-29 MED ORDER — LISINOPRIL 5 MG PO TABS: 2.5000 mg | ORAL_TABLET | Freq: Every day | ORAL | 3 refills | Status: DC

## 2017-09-29 NOTE — Patient Instructions (Signed)
Increase LANTUS to 66 units at nights. After 3 days, if morning blood sugars are still > than 150, then increase Lantus to 70 units daily.

## 2017-09-29 NOTE — Progress Notes (Signed)
Patient ID: Terri Sparks, female    DOB: Feb 10, 1976  MRN: [id number]  CC: Diabetes   Subjective: Terri Sparks is a 41 y.o. female who presents for chronic disease management Her concerns today include:  41 year old female with history of diabetes type 2, tobacco dependence, peripheral vascular disease with recent left lower extremity acute limb ischemia requiring stent to the iliac artery and embolectomy followed by Dr. Zenia Resides vascular surgery, hyperparathyroidism s/p resection of parathryroid adenoma 2010, GERD, and anxiety/dep  1. Everything she eats make her sick - vomiting, diarrhea and bloating -on Metformin since May but she does not think this is causing it -stop caffeine and sodas but symptoms persist -has to take two of the Prilosec at a time to settle her stomach. Request inc dose  -2. Microalbumin: on last visit. Never picked up rxn for LIsinopril  3. DM: BS have been in the 300s Was taking Metformin once a day. Did not know it was suppose to be BID. Compliant with Lantus 60 and Novolog 5-15 units. Took 15 units this a.m with breakfast  Doing better with eating habits  4. Wants something to help her sleep Compliant with Lexapro and hydroxyzine. Wants referral to Dr. Ellard Artis Blunt.   5 tobacco dependence: Would like to try the nicotine patches. Patient Active Problem List   Diagnosis Date Noted  . Immunization due 08/01/2017  . Anxiety and depression 04/28/2017  . 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 Iliac artery occlusion, left ([place]) 02/25/2017  . Tobacco abuse 02/25/2017  . Diabetes mellitus, type II ([place]) 02/25/2017  . Peripheral vascular disease of lower extremity ([place]) 02/24/2017  . GERD (gastroesophageal reflux disease)   . Hyperparathyroidism      Current Outpatient Medications on File Prior to Visit  Medication Sig Dispense Refill  . blood glucose meter kit and supplies Dispense based on patient and insurance preference. Use up to four  times daily as directed. (FOR ICD-9 250.00, 250.01). 1 each 0  . Blood Glucose Monitoring Suppl (TRUE METRIX METER) w/Device KIT Use as directed 1 kit 0  . clopidogrel (PLAVIX) 75 MG tablet Take 1 tablet (75 mg total) by mouth daily. 30 tablet 10  . escitalopram (LEXAPRO) 10 MG tablet Take 1 tablet (10 mg total) by mouth daily. 30 tablet 4  . gabapentin (NEURONTIN) 300 MG capsule Take 1 capsule (300 mg total) by mouth 2 (two) times daily. 60 capsule 3  . glucose blood (TRUE METRIX BLOOD GLUCOSE TEST) test strip Use as instructed 100 each 12  . glucose blood test strip Use as instructed 100 each 0  . ibuprofen (ADVIL,MOTRIN) 200 MG tablet Take 400 mg by mouth every 6 (six) hours as needed (for pain or headaches).     . insulin aspart (NOVOLOG) 100 UNIT/ML injection 5-15 units subcut TID with meals 10 mL 11  . insulin glargine (LANTUS) 100 UNIT/ML injection Inject 0.6 mLs (60 Units total) into the skin daily. 10 mL 11  . Insulin Pen Needle 31G X 5 MM MISC Use with Lantus and Novolog injections. 100 each 11  . Insulin Syringe-Needle U-100 (BD INSULIN SYRINGE ULTRAFINE) 31G X 5/16" 0.5 ML MISC Use as directed 100 each 3  . Multiple Vitamins-Calcium (ONE-A-DAY WOMENS FORMULA) TABS Take 1 tablet by mouth daily.    . nicotine (NICODERM CQ - DOSED IN MG/24 HOURS) 14 mg/24hr patch Place 1 patch (14 mg total) onto the skin daily. 28 patch 1  . TRUEPLUS LANCETS 28G MISC Use as  directed 100 each 6   No current facility-administered medications on file prior to visit.     Allergies  Allergen Reactions  . Ciprofloxacin Hcl Hives  . Macrobid [Nitrofurantoin Macrocrystal] Hives, Shortness Of Breath and Rash  . Other Hives, Shortness Of Breath and Rash    NO "-CILLINS"!!!  . Penicillins Shortness Of Breath    Had had cephalosporins without incident  . Sulfa Antibiotics Hives, Shortness Of Breath and Rash    Social History   Socioeconomic History  . Marital status: Single    Spouse name: Not on file    . Number of children: Not on file  . Years of education: Not on file  . Highest education level: Not on file  Social Needs  . Financial resource strain: Not on file  . Food insecurity - worry: Not on file  . Food insecurity - inability: Not on file  . Transportation needs - medical: Not on file  . Transportation needs - non-medical: Not on file  Occupational History  . Not on file  Tobacco Use  . Smoking status: Former Smoker    Years: 15.00    Types: Cigarettes    Last attempt to quit: 03/04/2017    Years since quitting: 0.5  . Smokeless tobacco: Never Used  Substance and Sexual Activity  . Alcohol use: No  . Drug use: Yes    Types: Marijuana    Comment: occasionally  . Sexual activity: Not on file  Other Topics Concern  . Not on file  Social History Narrative  . Not on file    Family History  Problem Relation Age of Onset  . Depression Mother   . Diabetes Father   . Heart failure Father     Past Surgical History:  Procedure Laterality Date  . EMBOLECTOMY Left 02/24/2017   Procedure: Left Lower Extremity Embolectomy and Angiogram.;  Surgeon: Waynetta Sandy, MD;  Location: [facility];  Service: Vascular;  Laterality: Left;  . INSERTION OF ILIAC STENT  02/24/2017   Procedure: INSERTION OF Common ILIAC STENT;  Surgeon: Waynetta Sandy, MD;  Location: [facility];  Service: Vascular;;  . LOWER EXTREMITY ANGIOGRAM Left 02/24/2017   Procedure: Aortagram, Left lower extremity Run-off;  Surgeon: Waynetta Sandy, MD;  Location: [facility];  Service: Vascular;  Laterality: Left;  . 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 TUBAL LIGATION      ROS: Review of Systems Negative except as stated above. PHYSICAL EXAM: BP (!) 141/84   Pulse (!) 102   Temp 97.9 F (36.6 C) (Oral)   Resp 16   Wt 276 lb 9.6 oz (125.5 kg)   SpO2 100%   BMI 43.32 kg/m   Physical Exam  General appearance - alert, well appearing, and in no distress Mental status - alert, oriented  to person, place, and time, normal mood, behavior, speech, dress, motor activity, and thought processes Chest - clear to auscultation, no wheezes, rales or rhonchi, symmetric air entry Heart - normal rate, regular rhythm, normal S1, S2, no murmurs, rubs, clicks or gallops Extremities - peripheral pulses normal, no pedal edema, no clubbing or cyanosis   Results for orders placed or performed in visit on 08/01/17  Microalbumin/Creatinine Ratio, Urine  Result Value Ref Range   Creatinine, Urine 52.5 Not Estab. mg/dL   Albumin, Urine 38.6 Not Estab. ug/mL   Microalb/Creat Ratio 73.5 (H) 0.0 - 30.0 mg/g creat  POCT glucose (manual entry)  Result Value Ref Range   POC  Glucose 448 (A) 70 - 99 mg/dl  POCT glycosylated hemoglobin (Hb A1C)  Result Value Ref Range   Hemoglobin A1C 11.6   POCT urinalysis dipstick  Result Value Ref Range   Color, UA yellow    Clarity, UA cloudy    Glucose, UA 500    Bilirubin, UA negative    Ketones, UA negative    Spec Grav, UA 1.010 1.010 - 1.025   Blood, UA large    pH, UA 6.0 5.0 - 8.0   Protein, UA negative    Urobilinogen, UA 0.2 0.2 or 1.0 E.U./dL   Nitrite, UA negative    Leukocytes, UA Small (1+) (A) Negative  Cytology - PAP  Result Value Ref Range   Adequacy      Satisfactory for evaluation  endocervical/transformation zone component PRESENT.   Diagnosis      NEGATIVE FOR INTRAEPITHELIAL LESIONS OR MALIGNANCY.   HPV NOT DETECTED    Chlamydia Negative    Neisseria gonorrhea Negative    Bacterial vaginitis Negative for Bacterial Vaginitis Microorganisms    Candida vaginitis Negative for Candida species    Trichomonas Negative    Material Submitted CervicoVaginal Pap [ThinPrep Imaged]     ASSESSMENT AND PLAN: 1. Diabetes mellitus type 2, uncontrolled, with complications (HCC) Increase Lantus to 66 units at bedtime.  If after 3 days blood sugars are still greater than 150 in the mornings, she will increase Lantus to 70 units. Change  metformin to 500 XR  mg once a day to see if she tolerates it better.  I think her GI symptoms may be due to the metformin. - POCT glucose (manual entry) - POCT urinalysis dipstick - metFORMIN (GLUCOPHAGE XR) 500 MG 24 hr tablet; Take 1 tablet (500 mg total) by mouth daily with breakfast.  Dispense: 90 tablet; Refill: 3 - insulin aspart (novoLOG) injection 10 Units  2. Microalbuminuria - lisinopril (PRINIVIL,ZESTRIL) 5 MG tablet; Take 0.5 tablets (2.5 mg total) by mouth daily.  Dispense: 45 tablet; Refill: 3  3. Insomnia, unspecified type We will try her with low dose of trazodone.  Good sleep hygiene discussed. - traZODone (DESYREL) 50 MG tablet; Take 0.5 tablets (25 mg total) by mouth at bedtime as needed for sleep.  Dispense: 30 tablet; Refill: 3  4. Anxiety and depression Continue hydroxyzine and Lexapro.  Referral to psychiatry per her request. - hydrOXYzine (ATARAX/VISTARIL) 25 MG tablet; TAKE 1 TABLET BY MOUTH AT BEDTIME AS NEEDED FOR ANXIETY.  Dispense: 30 tablet; Refill: 2 - Ambulatory referral to Psychiatry  5. Gastroesophageal reflux disease without esophagitis GERD precautions discussed. Increase Prilosec to 40 mg daily. I think the bloating and diarrhea may be due to metformin.  We have tried changing out to the XR to see if she tolerates better. - omeprazole (PRILOSEC) 20 MG capsule; Take 2 capsules (40 mg total) by mouth daily.  Dispense: 60 capsule; Refill: 3  6. Tobacco dependence Advised to call [phone] now to request free nicotine patches.  Patient was given the opportunity to ask questions.  Patient verbalized understanding of the plan and was able to repeat key elements of the plan.   Orders Placed This Encounter  Procedures  . Ambulatory referral to Psychiatry  . POCT glucose (manual entry)  . POCT urinalysis dipstick     Requested Prescriptions   Signed Prescriptions Disp Refills  . metFORMIN (GLUCOPHAGE XR) 500 MG 24 hr tablet 90 tablet 3    Sig:  Take 1 tablet (500 mg total) by  mouth daily with breakfast.  . lisinopril (PRINIVIL,ZESTRIL) 5 MG tablet 45 tablet 3    Sig: Take 0.5 tablets (2.5 mg total) by mouth daily.  . traZODone (DESYREL) 50 MG tablet 30 tablet 3    Sig: Take 0.5 tablets (25 mg total) by mouth at bedtime as needed for sleep.  . hydrOXYzine (ATARAX/VISTARIL) 25 MG tablet 30 tablet 2    Sig: TAKE 1 TABLET BY MOUTH AT BEDTIME AS NEEDED FOR ANXIETY.  Marland Kitchen omeprazole (PRILOSEC) 20 MG capsule 60 capsule 3    Sig: Take 2 capsules (40 mg total) by mouth daily.    Return in about 7 weeks (around 11/17/2017).  Karle Plumber, MD, FACP

## 2017-10-07 ENCOUNTER — Ambulatory Visit: Payer: Medicaid Other | Admitting: Vascular Surgery

## 2017-10-10 MED FILL — !NOVOLOG 100UNITS/ML VIAL: 100/ML | 22 days supply | Qty: 10 | Fill #3

## 2017-10-10 MED FILL — ?OMEPRAZOLE DR 20MG CAPSULE: 20 | 30 days supply | Qty: 60 | Fill #0

## 2017-10-10 MED FILL — METFORMIN HCL ER 500 MG TAB: 500 | 30 days supply | Qty: 30 | Fill #0

## 2017-10-10 MED FILL — traZODone HCL 50 MG TABS: 50 | 30 days supply | Qty: 15 | Fill #0

## 2017-10-10 MED FILL — ?LISINOPRIL 5 MG TABLET: 5 | 30 days supply | Qty: 15 | Fill #0

## 2017-10-10 MED FILL — hydrOXYzine HCL 25 MG TABS: 25 | 30 days supply | Qty: 30 | Fill #0

## 2017-10-10 MED FILL — !LANTUS 100 UNITS/ML VIAL: 100 | 16 days supply | Qty: 10 | Fill #2

## 2017-10-10 MED FILL — TRUE METRIX TEST STRIP: 25 days supply | Qty: 100 | Fill #2

## 2017-10-17 MED FILL — GABAPENTIN 300 MG CAPSULE: 300 | 30 days supply | Qty: 60 | Fill #2

## 2017-11-04 ENCOUNTER — Encounter: Payer: Self-pay | Admitting: Vascular Surgery

## 2017-11-04 ENCOUNTER — Encounter: Payer: Self-pay | Admitting: *Deleted

## 2017-11-04 ENCOUNTER — Other Ambulatory Visit: Payer: Self-pay | Admitting: *Deleted

## 2017-11-04 ENCOUNTER — Ambulatory Visit (INDEPENDENT_AMBULATORY_CARE_PROVIDER_SITE_OTHER): Payer: Self-pay | Admitting: Vascular Surgery

## 2017-11-04 VITALS — BP 148/84 | HR 104 | Temp 98.6°F | Resp 18 | Ht 67.0 in | Wt 278.0 lb

## 2017-11-04 DIAGNOSIS — I779 Disorder of arteries and arterioles, unspecified: Secondary | ICD-10-CM

## 2017-11-04 NOTE — Progress Notes (Signed)
Patient ID: Terri Sparks, female   DOB: 1976/02/20, 42 y.o.   MRN: [id number]  Reason for Consult: Follow-up (to discuss aortogram)   Referred by Ladell Pier, MD  Subjective:     HPI:  Terri Sparks is a 42 y.o. female with history of acute left lower extremity ischemia.  She now has persistent left lower extremity pain occurs both at rest and worse with walking requiring her to use a cane.  She is religiously taking her aspirin and Plavix.  She has attempted to quit smoking but is still doing so because of the stressors.  She is working but is applying for disability.  She is taking Neurontin 3 times a day.  Last visit she was still having pain we discussed possibly proceeding with angiogram.  Past Medical History:  Diagnosis Date  . Anxiety   . Diabetes ([place])   . GERD (gastroesophageal reflux disease)   . History of blood clots   . Hypercalcemia   . Hyperparathyroidism   . Nephrolithiasis   . Renal calculi    Family History  Problem Relation Age of Onset  . Depression Mother   . Diabetes Father   . Heart failure Father    Past Surgical History:  Procedure Laterality Date  . EMBOLECTOMY Left 02/24/2017   Procedure: Left Lower Extremity Embolectomy and Angiogram.;  Surgeon: Waynetta Sandy, MD;  Location: [place];  Service: Vascular;  Laterality: Left;  . INSERTION OF ILIAC STENT  02/24/2017   Procedure: INSERTION OF Common ILIAC STENT;  Surgeon: Waynetta Sandy, MD;  Location: [facility];  Service: Vascular;;  . LOWER EXTREMITY ANGIOGRAM Left 02/24/2017   Procedure: Aortagram, Left lower extremity Run-off;  Surgeon: Waynetta Sandy, MD;  Location: [facility];  Service: Vascular;  Laterality: Left;  . 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 TUBAL LIGATION      Short Social History:  Social History   Tobacco Use  . Smoking status: Current Some Day Smoker    Packs/day: 0.25    Years: 15.00    Pack years: 3.75    Types: Cigarettes    Last  attempt to quit: 03/04/2017    Years since quitting: 0.6  . Smokeless tobacco: Never Used  Substance Use Topics  . Alcohol use: No    Allergies  Allergen Reactions  . Ciprofloxacin Hcl Hives  . Macrobid [Nitrofurantoin Macrocrystal] Hives, Shortness Of Breath and Rash  . Other Hives, Shortness Of Breath and Rash    NO "-CILLINS"!!!  . Penicillins Shortness Of Breath    Had had cephalosporins without incident  . Sulfa Antibiotics Hives, Shortness Of Breath and Rash    Current Outpatient Medications  Medication Sig Dispense Refill  . blood glucose meter kit and supplies Dispense based on patient and insurance preference. Use up to four times daily as directed. (FOR ICD-9 250.00, 250.01). 1 each 0  . Blood Glucose Monitoring Suppl (TRUE METRIX METER) w/Device KIT Use as directed 1 kit 0  . clopidogrel (PLAVIX) 75 MG tablet Take 1 tablet (75 mg total) by mouth daily. 30 tablet 10  . escitalopram (LEXAPRO) 10 MG tablet Take 1 tablet (10 mg total) by mouth daily. 30 tablet 4  . gabapentin (NEURONTIN) 300 MG capsule Take 1 capsule (300 mg total) by mouth 2 (two) times daily. 60 capsule 3  . glucose blood (TRUE METRIX BLOOD GLUCOSE TEST) test strip Use as instructed 100 each 12  . glucose blood test strip Use as  instructed 100 each 0  . hydrOXYzine (ATARAX/VISTARIL) 25 MG tablet TAKE 1 TABLET BY MOUTH AT BEDTIME AS NEEDED FOR ANXIETY. 30 tablet 2  . ibuprofen (ADVIL,MOTRIN) 200 MG tablet Take 400 mg by mouth every 6 (six) hours as needed (for pain or headaches).     . insulin aspart (NOVOLOG) 100 UNIT/ML injection 5-15 units subcut TID with meals 10 mL 11  . insulin glargine (LANTUS) 100 UNIT/ML injection Inject 0.6 mLs (60 Units total) into the skin daily. 10 mL 11  . Insulin Pen Needle 31G X 5 MM MISC Use with Lantus and Novolog injections. 100 each 11  . Insulin Syringe-Needle U-100 (BD INSULIN SYRINGE ULTRAFINE) 31G X 5/16" 0.5 ML MISC Use as directed 100 each 3  . lisinopril  (PRINIVIL,ZESTRIL) 5 MG tablet Take 0.5 tablets (2.5 mg total) by mouth daily. 45 tablet 3  . metFORMIN (GLUCOPHAGE XR) 500 MG 24 hr tablet Take 1 tablet (500 mg total) by mouth daily with breakfast. 90 tablet 3  . Multiple Vitamins-Calcium (ONE-A-DAY WOMENS FORMULA) TABS Take 1 tablet by mouth daily.    . nicotine (NICODERM CQ - DOSED IN MG/24 HOURS) 14 mg/24hr patch Place 1 patch (14 mg total) onto the skin daily. 28 patch 1  . omeprazole (PRILOSEC) 20 MG capsule Take 2 capsules (40 mg total) by mouth daily. 60 capsule 3  . traZODone (DESYREL) 50 MG tablet Take 0.5 tablets (25 mg total) by mouth at bedtime as needed for sleep. 30 tablet 3  . TRUEPLUS LANCETS 28G MISC Use as directed 100 each 6   No current facility-administered medications for this visit.     Review of Systems  Constitutional:  Constitutional negative. HENT: HENT negative.  Eyes: Eyes negative.  Respiratory: Respiratory negative.  Cardiovascular: Cardiovascular negative.  GI: Gastrointestinal negative.  Musculoskeletal: Positive for leg pain.  Skin: Skin negative.  Neurological: Positive for numbness.  Hematologic: Hematologic/lymphatic negative.  Psychiatric: Psychiatric negative.        Objective:  Objective   Vitals:   11/04/17 1023 11/04/17 1026  BP: (!) 150/83 (!) 148/84  Pulse: (!) 104   Resp: 18   Temp: 98.6 F (37 C)   TempSrc: Oral   SpO2: 99%   Weight: 278 lb (126.1 kg)   Height: _0  (1.702 m)    Body mass index is 43.54 kg/m.  Physical Exam  Constitutional: She is oriented to person, place, and time. She appears well-developed.  HENT:  Head: Normocephalic.  Eyes: Pupils are equal, round, and reactive to light.  Neck: Normal range of motion.  Cardiovascular: Normal rate.  Pulses:      Femoral pulses are 2+ on the right side, and 2+ on the left side. Strong left peroneal signal  Pulmonary/Chest: Effort normal.  Abdominal: Soft.  Musculoskeletal: Normal range of motion.    Neurological: She is alert and oriented to person, place, and time.  Skin: Skin is warm and dry.  Psychiatric: She has a normal mood and affect. Her behavior is normal. Judgment and thought content normal.    Data: No new studies     Assessment/Plan:     42 year old female follows up prior to her scheduled visit with worsening left lower extremity pain.  She can take her Neurontin a total of 4 times a day which we have discussed.  Given the amount of disease and difficulty we had revascularizing her at the acute operation I think it is worth proceeding with angiogram.  She is in full agreement with  this.  She demonstrates good understanding we will continue her dual antiplatelets to the time of the procedure we will get her scheduled today.     Waynetta Sandy MD [facility]

## 2017-11-08 DIAGNOSIS — Z0279 Encounter for issue of other medical certificate: Secondary | ICD-10-CM

## 2017-11-11 ENCOUNTER — Ambulatory Visit: Payer: Medicaid Other | Admitting: Internal Medicine

## 2017-11-14 ENCOUNTER — Ambulatory Visit (HOSPITAL_COMMUNITY)
Admission: RE | Disposition: A | Discharge status: Home / Self Care | Payer: Self-pay | Source: Ambulatory Visit | Attending: Vascular Surgery

## 2017-11-14 ENCOUNTER — Ambulatory Visit (HOSPITAL_COMMUNITY)
Admission: RE | Admit: 2017-11-14 | Discharge: 2017-11-14 | Disposition: A | Discharge status: Home / Self Care | Payer: Medicaid Other | Source: Ambulatory Visit | Attending: Vascular Surgery | Admitting: Vascular Surgery

## 2017-11-14 DIAGNOSIS — M79605 Pain in left leg: Secondary | ICD-10-CM | POA: Diagnosis not present

## 2017-11-14 DIAGNOSIS — Z95820 Peripheral vascular angioplasty status with implants and grafts: Secondary | ICD-10-CM | POA: Insufficient documentation

## 2017-11-14 HISTORY — PX: ABDOMINAL AORTOGRAM W/LOWER EXTREMITY: CATH118223

## 2017-11-14 LAB — GLUCOSE, CAPILLARY
Glucose-Capillary: 244 mg/dL — ABNORMAL HIGH (ref 65–99)
Glucose-Capillary: 239 mg/dL — ABNORMAL HIGH (ref 65–99)

## 2017-11-14 LAB — POCT I-STAT, CHEM 8
BUN: 17 mg/dL (ref 6–20)
Calcium, Ion: 1.19 mmol/L (ref 1.15–1.40)
Chloride: 105 mmol/L (ref 101–111)
Creatinine, Ser: 1.1 mg/dL — ABNORMAL HIGH (ref 0.44–1.00)
Glucose, Bld: 248 mg/dL — ABNORMAL HIGH (ref 65–99)
HCT: 37 % (ref 36.0–46.0)
Hemoglobin: 12.6 g/dL (ref 12.0–15.0)
Potassium: 4 mmol/L (ref 3.5–5.1)
Sodium: 137 mmol/L (ref 135–145)
TCO2: 24 mmol/L (ref 22–32)

## 2017-11-14 LAB — PREGNANCY, URINE: Preg Test, Ur: NEGATIVE

## 2017-11-14 SURGERY — ABDOMINAL AORTOGRAM W/LOWER EXTREMITY
Anesthesia: LOCAL

## 2017-11-14 MED ORDER — ACETAMINOPHEN 325 MG PO TABS
ORAL_TABLET | ORAL | Status: AC
  Administered 2017-11-14: 650 mg via ORAL
  Filled 2017-11-14: qty 2

## 2017-11-14 MED ORDER — SODIUM CHLORIDE 0.9 % IV SOLN
INTRAVENOUS | Status: DC
  Administered 2017-11-14: 09:00:00 via INTRAVENOUS

## 2017-11-14 MED ORDER — LIDOCAINE HCL 1 % IJ SOLN
INTRAMUSCULAR | Status: AC
  Filled 2017-11-14: qty 20

## 2017-11-14 MED ORDER — OXYCODONE HCL 5 MG PO TABS: 5.0000 mg | ORAL_TABLET | ORAL | Status: DC | PRN

## 2017-11-14 MED ORDER — HYDRALAZINE HCL 20 MG/ML IJ SOLN: 5.0000 mg | INTRAMUSCULAR | Status: DC | PRN

## 2017-11-14 MED ORDER — HEPARIN (PORCINE) IN NACL 2-0.9 UNIT/ML-% IJ SOLN
INTRAMUSCULAR | Status: AC | PRN
  Administered 2017-11-14: 1000 mL via INTRA_ARTERIAL

## 2017-11-14 MED ORDER — MORPHINE SULFATE (PF) 10 MG/ML IV SOLN: 2.0000 mg | INTRAVENOUS | Status: DC | PRN

## 2017-11-14 MED ORDER — LABETALOL HCL 5 MG/ML IV SOLN: 10.0000 mg | INTRAVENOUS | Status: DC | PRN

## 2017-11-14 MED ORDER — SODIUM CHLORIDE 0.9 % IV SOLN: 250.0000 mL | INTRAVENOUS | Status: DC | PRN

## 2017-11-14 MED ORDER — SODIUM CHLORIDE 0.9% FLUSH: 3.0000 mL | INTRAVENOUS | Status: DC | PRN

## 2017-11-14 MED ORDER — MIDAZOLAM HCL 2 MG/2ML IJ SOLN
INTRAMUSCULAR | Status: AC
  Filled 2017-11-14: qty 2

## 2017-11-14 MED ORDER — MIDAZOLAM HCL 2 MG/2ML IJ SOLN
INTRAMUSCULAR | Status: DC | PRN
  Administered 2017-11-14: 1 mg via INTRAVENOUS

## 2017-11-14 MED ORDER — FENTANYL CITRATE (PF) 100 MCG/2ML IJ SOLN
INTRAMUSCULAR | Status: DC | PRN
  Administered 2017-11-14: 50 ug via INTRAVENOUS

## 2017-11-14 MED ORDER — SODIUM CHLORIDE 0.9 % WEIGHT BASED INFUSION: 1.0000 mL/kg/h | INTRAVENOUS | Status: DC

## 2017-11-14 MED ORDER — ACETAMINOPHEN 325 MG PO TABS
650.0000 mg | ORAL_TABLET | Freq: Once | ORAL | Status: AC
  Administered 2017-11-14: 650 mg via ORAL

## 2017-11-14 MED ORDER — LIDOCAINE HCL (PF) 1 % IJ SOLN
INTRAMUSCULAR | Status: DC | PRN
  Administered 2017-11-14: 15 mL

## 2017-11-14 MED ORDER — FENTANYL CITRATE (PF) 100 MCG/2ML IJ SOLN
INTRAMUSCULAR | Status: AC
  Filled 2017-11-14: qty 2

## 2017-11-14 MED ORDER — IODIXANOL 320 MG/ML IV SOLN
INTRAVENOUS | Status: DC | PRN
  Administered 2017-11-14: 119 mL via INTRA_ARTERIAL

## 2017-11-14 MED ORDER — SODIUM CHLORIDE 0.9% FLUSH: 3.0000 mL | Freq: Two times a day (BID) | INTRAVENOUS | Status: DC

## 2017-11-14 SURGICAL SUPPLY — 12 items
CATH OMNI FLUSH 5F 65CM (CATHETERS) ×2 IMPLANT
COVER PRB 48X5XTLSCP FOLD TPE (BAG) ×1 IMPLANT
COVER PROBE 5X48 (BAG) ×1
KIT MICROPUNCTURE NIT STIFF (SHEATH) ×2 IMPLANT
KIT PV (KITS) ×2 IMPLANT
SHEATH PINNACLE 5F 10CM (SHEATH) ×2 IMPLANT
STOPCOCK MORSE 400PSI 3WAY (MISCELLANEOUS) ×2 IMPLANT
SYR MEDRAD MARK V 150ML (SYRINGE) ×2 IMPLANT
TRANSDUCER W/STOPCOCK (MISCELLANEOUS) ×2 IMPLANT
TRAY PV CATH (CUSTOM PROCEDURE TRAY) ×2 IMPLANT
TUBING CIL FLEX 10 FLL-RA (TUBING) ×2 IMPLANT
WIRE BENTSON .035X145CM (WIRE) ×2 IMPLANT

## 2017-11-14 NOTE — Discharge Instructions (Signed)
NO METFORMIN/GLUCOPHAGE FOR 2 DAYS ° ° °Femoral Site Care °Refer to this sheet in the next few weeks. These instructions provide you with information about caring for yourself after your procedure. Your health care provider may also give you more specific instructions. Your treatment has been planned according to current medical practices, but problems sometimes occur. Call your health care provider if you have any problems or questions after your procedure. °What can I expect after the procedure? °After your procedure, it is typical to have the following: °· Bruising at the site that usually fades within 1-2 weeks. °· Blood collecting in the tissue (hematoma) that may be painful to the touch. It should usually decrease in size and tenderness within 1-2 weeks. ° °Follow these instructions at home: °· Take medicines only as directed by your health care provider. °· You may shower 24-48 hours after the procedure or as directed by your health care provider. Remove the bandage (dressing) and gently wash the site with plain soap and water. Pat the area dry with a clean towel. Do not rub the site, because this may cause bleeding. °· Do not take baths, swim, or use a hot tub until your health care provider approves. °· Check your insertion site every day for redness, swelling, or drainage. °· Do not apply powder or lotion to the site. °· Limit use of stairs to twice a day for the first 2-3 days or as directed by your health care provider. °· Do not squat for the first 2-3 days or as directed by your health care provider. °· Do not lift over 10 lb (4.5 kg) for 5 days after your procedure or as directed by your health care provider. °· Ask your health care provider when it is okay to: °? Return to work or school. °? Resume usual physical activities or sports. °? Resume sexual activity. °· Do not drive home if you are discharged the same day as the procedure. Have someone else drive you. °· You may drive 24 hours after the  procedure unless otherwise instructed by your health care provider. °· Do not operate machinery or power tools for 24 hours after the procedure or as directed by your health care provider. °· If your procedure was done as an outpatient procedure, which means that you went home the same day as your procedure, a responsible adult should be with you for the first 24 hours after you arrive home. °· Keep all follow-up visits as directed by your health care provider. This is important. °Contact a health care provider if: °· You have a fever. °· You have chills. °· You have increased bleeding from the site. Hold pressure on the site. °Get help right away if: °· You have unusual pain at the site. °· You have redness, warmth, or swelling at the site. °· You have drainage (other than a small amount of blood on the dressing) from the site. °· The site is bleeding, and the bleeding does not stop after 30 minutes of holding steady pressure on the site. °· Your leg or foot becomes pale, cool, tingly, or numb. °This information is not intended to replace advice given to you by your health care provider. Make sure you discuss any questions you have with your health care provider. °Document Released: 05/24/2014 Document Revised: 02/26/2016 Document Reviewed: 04/09/2014 °Elsevier Interactive Patient Education © 2018 [employer] ° °

## 2017-11-14 NOTE — Progress Notes (Signed)
Site area: Right groin a 5 french arterial sheath was removed  Site Prior to Removal:  Level 0  Pressure Applied For 20 MINUTES    Bedrest Beginning at 1115am  Manual:   Yes.    Patient Status During Pull:  stable  Post Pull Groin Site:  Level 0  Post Pull Instructions Given:  Yes.    Post Pull Pulses Present:  Yes.    Dressing Applied:  Yes.    Comments:  VS remain stable

## 2017-11-14 NOTE — Op Note (Signed)
    Patient name: Terri Sparks MRN: [id number] DOB: 1976/04/01 Sex: female  11/14/2017 Pre-operative Diagnosis: left lower extremity pain Post-operative diagnosis:  Same Surgeon:  Erlene Quan C. Donzetta Matters, MD Procedure Performed: 1.  US guided cannulation of right common femoral artery 2.  Aortogram with bilateral lower extremity runoff 3.  Selection of left sfa 4.  Moderate sedation with fentanyl and versed for 19 minutes   Indications: 42 year old female has a previous history of a left lower extremity thrombi embolectomy with associated left common iliac artery stenting.  She now has persistent pain in her left leg.  She is indicated for angiogram possible intervention on the left lower extremity.  Findings: Aorta is patent.  Left common iliac stent is widely patent as well.  Right lower extremity has no flow-limiting lesions.  On the left lower extremity she is patent to the level of the trifurcation.  Posterior tibial artery appears to give out.  Peroneal artery is small close to the level of the ankle.  The anterior tibial artery is patent and is the dominant runoff to the level of the foot but does not fill the foot fully.  No intervention was undertaken.   Procedure:  The patient was identified in the holding area and taken to room 8.  The patient was then placed supine on the table and prepped and draped in the usual sterile fashion.  A time out was called.  Ultrasound was used to evaluate the right common femoral artery.  It was patent .  A digital ultrasound image was acquired.  A micropuncture needle was used to access the right common femoral artery under ultrasound guidance.  An 018 wire was advanced without resistance and a micropuncture sheath was placed.  The 018 wire was removed and a benson wire was placed.  The micropuncture sheath was exchanged for a 5 french sheath.  An omniflush catheter was advanced over the wire to the level of L-1.  An abdominal angiogram was obtained followed by  bilateral lower extremity runoff with the above findings.  Given that we cannot see fully to the left lower extremity we then went up and over with Bentson wire and Omni catheter into the left SFA and performed designated left lower extremity imaging of the lower leg and foot with the above findings.  Satisfied that there is no intervention we then removed the catheter over wire.  Sheath will be pulled in postoperative holding.  Patient tolerated procedure well without immediate comp occasion.  Contrast: 119cc   Allysia Ingles C. Donzetta Matters, MD [facility] [place] Office: [phone] Pager: [phone]

## 2017-11-14 NOTE — Progress Notes (Signed)
Up and walked and tolerated well; right groin stable no bleeding or hematoma 

## 2017-11-14 NOTE — H&P (Signed)
   History and Physical Update  The patient was interviewed and re-examined.  The patient's previous History and Physical has been reviewed and is unchanged from recent office visit. Plan for angiogram with possible intervention of left leg.  Terri Sparks C. Donzetta Matters, MD [facility] [place] Office: [phone] Pager: [phone]   11/14/2017, 9:23 AM

## 2017-11-15 ENCOUNTER — Encounter (HOSPITAL_COMMUNITY): Payer: Self-pay | Admitting: Vascular Surgery

## 2017-11-15 ENCOUNTER — Other Ambulatory Visit: Payer: Self-pay

## 2017-11-15 MED FILL — Lidocaine HCl Local Inj 1%: INTRAMUSCULAR | Qty: 20 | Status: AC

## 2017-11-16 ENCOUNTER — Telehealth: Payer: Self-pay | Admitting: Vascular Surgery

## 2017-11-16 NOTE — Telephone Encounter (Signed)
-----   Message from Mena Goes, RN sent at 11/14/2017  6:02 PM EST ----- Regarding: 4-6 months   ----- Message ----- From: Waynetta Sandy, MD Sent: 11/14/2017  10:41 AM To: [address] Terri Sparks [phone] [birth year]  11/14/2017 Pre-operative Diagnosis: left lower extremity pain  Surgeon:  Erlene Quan C. Donzetta Matters, MD  Procedure Performed: 1.  US guided cannulation of right common femoral artery 2.  Aortogram with bilateral lower extremity runoff 3.  Selection of left sfa 4.  Moderate sedation with fentanyl and versed for 19 minutes   F/u in 4-6 months with ABI

## 2017-11-16 NOTE — Telephone Encounter (Signed)
Sched appt 04/14/18; lab at 12:00 and MD at 12:45. Mailed appt letter to inform pt of appt.

## 2017-11-21 MED FILL — ?OMEPRAZOLE DR 20MG CAPSULE: 20 | 30 days supply | Qty: 60 | Fill #1

## 2017-11-21 MED FILL — $LANTUS 100 UNITS/ML VIAL: 100 | 33 days supply | Qty: 20 | Fill #3

## 2017-11-21 MED FILL — !NOVOLOG 100UNITS/ML VIAL: 100/ML | 22 days supply | Qty: 10 | Fill #4

## 2017-11-21 MED FILL — ?CLOPIDOGREL 75MG TA: 75 | 30 days supply | Qty: 30 | Fill #1

## 2017-11-28 DIAGNOSIS — Z0279 Encounter for issue of other medical certificate: Secondary | ICD-10-CM

## 2017-12-27 MED FILL — !NOVOLOG 100UNITS/ML VIAL: 100/ML | 22 days supply | Qty: 10 | Fill #5

## 2017-12-27 MED FILL — $LANTUS 100 UNITS/ML VIAL: 100 | 33 days supply | Qty: 20 | Fill #4

## 2017-12-27 MED FILL — CLOPIDOGREL 75 MG TABLET: 75 | 30 days supply | Qty: 30 | Fill #2

## 2018-01-27 ENCOUNTER — Encounter: Payer: Self-pay | Admitting: Internal Medicine

## 2018-01-27 ENCOUNTER — Ambulatory Visit: Payer: Self-pay | Attending: Internal Medicine | Admitting: Internal Medicine

## 2018-01-27 VITALS — BP 131/83 | HR 98 | Temp 98.0°F | Resp 16 | Wt 280.6 lb

## 2018-01-27 DIAGNOSIS — F17200 Nicotine dependence, unspecified, uncomplicated: Secondary | ICD-10-CM

## 2018-01-27 DIAGNOSIS — E1165 Type 2 diabetes mellitus with hyperglycemia: Secondary | ICD-10-CM

## 2018-01-27 DIAGNOSIS — E213 Hyperparathyroidism, unspecified: Secondary | ICD-10-CM | POA: Insufficient documentation

## 2018-01-27 DIAGNOSIS — Z833 Family history of diabetes mellitus: Secondary | ICD-10-CM | POA: Insufficient documentation

## 2018-01-27 DIAGNOSIS — Z794 Long term (current) use of insulin: Secondary | ICD-10-CM

## 2018-01-27 DIAGNOSIS — F419 Anxiety disorder, unspecified: Secondary | ICD-10-CM

## 2018-01-27 DIAGNOSIS — D509 Iron deficiency anemia, unspecified: Secondary | ICD-10-CM

## 2018-01-27 DIAGNOSIS — IMO0001 Reserved for inherently not codable concepts without codable children: Secondary | ICD-10-CM

## 2018-01-27 DIAGNOSIS — Z881 Allergy status to other antibiotic agents status: Secondary | ICD-10-CM | POA: Insufficient documentation

## 2018-01-27 DIAGNOSIS — Z6841 Body Mass Index (BMI) 40.0 and over, adult: Secondary | ICD-10-CM

## 2018-01-27 DIAGNOSIS — Z888 Allergy status to other drugs, medicaments and biological substances status: Secondary | ICD-10-CM | POA: Insufficient documentation

## 2018-01-27 DIAGNOSIS — F329 Major depressive disorder, single episode, unspecified: Secondary | ICD-10-CM

## 2018-01-27 DIAGNOSIS — Z7982 Long term (current) use of aspirin: Secondary | ICD-10-CM | POA: Insufficient documentation

## 2018-01-27 DIAGNOSIS — Z8249 Family history of ischemic heart disease and other diseases of the circulatory system: Secondary | ICD-10-CM | POA: Insufficient documentation

## 2018-01-27 DIAGNOSIS — E1140 Type 2 diabetes mellitus with diabetic neuropathy, unspecified: Secondary | ICD-10-CM | POA: Insufficient documentation

## 2018-01-27 DIAGNOSIS — E1151 Type 2 diabetes mellitus with diabetic peripheral angiopathy without gangrene: Secondary | ICD-10-CM | POA: Insufficient documentation

## 2018-01-27 DIAGNOSIS — F17210 Nicotine dependence, cigarettes, uncomplicated: Secondary | ICD-10-CM | POA: Insufficient documentation

## 2018-01-27 DIAGNOSIS — Z880 Allergy status to penicillin: Secondary | ICD-10-CM | POA: Insufficient documentation

## 2018-01-27 DIAGNOSIS — Z79899 Other long term (current) drug therapy: Secondary | ICD-10-CM | POA: Insufficient documentation

## 2018-01-27 DIAGNOSIS — K219 Gastro-esophageal reflux disease without esophagitis: Secondary | ICD-10-CM | POA: Insufficient documentation

## 2018-01-27 DIAGNOSIS — Z882 Allergy status to sulfonamides status: Secondary | ICD-10-CM | POA: Insufficient documentation

## 2018-01-27 LAB — GLUCOSE, POCT (MANUAL RESULT ENTRY): POC Glucose: 348 mg/dl — AB (ref 70–99)

## 2018-01-27 LAB — POCT GLYCOSYLATED HEMOGLOBIN (HGB A1C): Hemoglobin A1C: 10.3

## 2018-01-27 MED ORDER — INSULIN GLARGINE 100 UNIT/ML ~~LOC~~ SOLN: 70.0000 [IU] | Freq: Every day | SUBCUTANEOUS | 11 refills | Status: DC

## 2018-01-27 MED ORDER — BUPROPION HCL ER (SMOKING DET) 150 MG PO TB12: 150.0000 mg | ORAL_TABLET | Freq: Two times a day (BID) | ORAL | 2 refills | Status: DC

## 2018-01-27 MED ORDER — HYDROXYZINE HCL 25 MG PO TABS: 50.0000 mg | ORAL_TABLET | Freq: Every day | ORAL | 2 refills | Status: DC

## 2018-01-27 MED ORDER — SITAGLIPTIN PHOSPHATE 50 MG PO TABS: 50.0000 mg | ORAL_TABLET | Freq: Every day | ORAL | 3 refills | Status: DC

## 2018-01-27 MED FILL — $LANTUS 100 UNITS/ML VIAL: 100 | 28 days supply | Qty: 20 | Fill #0

## 2018-01-27 NOTE — Patient Instructions (Addendum)
Stop Metformin. Start Januvia.  Increase Lantus to 70 units daily.   Please apply for [employer].

## 2018-01-27 NOTE — Progress Notes (Signed)
Patient ID: Terri Sparks, female    DOB: 05/14/1976  MRN: [id number]  CC: Diabetes   Subjective: Terri Sparks is a 42 y.o. female who presents for chronic ds management. Her concerns today include:  42 year old female with history of DM type 2 with microalbumin, tob dep, PVD with hx LLE acute limb ischemia requiring stent to the iliac artery and embolectomy followed by Dr. Zenia Resides vascular surgery, hyperparathyroidism s/p resection of parathryroid adenoma 2010, GERD, and anxiety/dep  1. Tob dep:  Went from 2 pks a day to 5 cig a day. Did get nicotine patches through [employer] but caused skin irritation. Would like to try Wellbutrin.  Had used in the past with good results  2.  Had disability eval last wk. Told she has significant anxiety. -referred to psychiatry on last visit.  No appt.  She does not have the [facility] -she stopped taking Lexapro. " I just did not like it."  Request increase dose of Hydroxyzine; two at bedtime works better.   3.  Still having stomach issues.  On last visit we changed her to Metformin ER see whether this would decrease the vomiting, diarrhea and bloating.this did not make a difference.  "Some days, anything I eats causes N/V and diarrhea."  Even caffeine causes bloating -Omeprazole does not help any more. -She would like to be referred to GI for endoscopy.  She has not had any weight loss.  No blood in the stools.   4.  DM:  Needs to get more stripes to check BS Med:  Taking Lantus 66-70 units and Novolog 5-15 units but doing 10 units on average Exercise:  "Walking as often as my legs will let me."  Patient Active Problem List   Diagnosis Date Noted  . Microalbuminuria 09/29/2017  . Insomnia 09/29/2017  . Immunization due 08/01/2017  . Anxiety and depression 04/28/2017  . 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 Iliac artery occlusion, left ([place]) 02/25/2017  . Tobacco abuse 02/25/2017  . Diabetes mellitus, type II ([place]) 02/25/2017    . Peripheral vascular disease of lower extremity ([place]) 02/24/2017  . GERD (gastroesophageal reflux disease)   . Hyperparathyroidism      Current Outpatient Medications on File Prior to Visit  Medication Sig Dispense Refill  . aspirin EC 81 MG tablet Take 81 mg by mouth daily.    . blood glucose meter kit and supplies Dispense based on patient and insurance preference. Use up to four times daily as directed. (FOR ICD-9 250.00, 250.01). 1 each 0  . Blood Glucose Monitoring Suppl (TRUE METRIX METER) w/Device KIT Use as directed 1 kit 0  . clopidogrel (PLAVIX) 75 MG tablet Take 1 tablet (75 mg total) by mouth daily. 30 tablet 10  . gabapentin (NEURONTIN) 300 MG capsule Take 1 capsule (300 mg total) by mouth 2 (two) times daily. 60 capsule 3  . glucose blood (TRUE METRIX BLOOD GLUCOSE TEST) test strip Use as instructed 100 each 12  . glucose blood test strip Use as instructed 100 each 0  . ibuprofen (ADVIL,MOTRIN) 200 MG tablet Take 400 mg by mouth every 6 (six) hours as needed (for pain or headaches).     . insulin aspart (NOVOLOG) 100 UNIT/ML injection 5-15 units subcut TID with meals 10 mL 11  . Insulin Pen Needle 31G X 5 MM MISC Use with Lantus and Novolog injections. 100 each 11  . Insulin Syringe-Needle U-100 (BD INSULIN SYRINGE ULTRAFINE) 31G X 5/16" 0.5 ML MISC  Use as directed 100 each 3  . lisinopril (PRINIVIL,ZESTRIL) 5 MG tablet Take 0.5 tablets (2.5 mg total) by mouth daily. 45 tablet 3  . Multiple Vitamins-Calcium (ONE-A-DAY WOMENS FORMULA) TABS Take 1 tablet by mouth daily.    . nicotine (NICODERM CQ - DOSED IN MG/24 HOURS) 14 mg/24hr patch Place 1 patch (14 mg total) onto the skin daily. 28 patch 1  . omeprazole (PRILOSEC) 20 MG capsule Take 2 capsules (40 mg total) by mouth daily. 60 capsule 3  . traZODone (DESYREL) 50 MG tablet Take 0.5 tablets (25 mg total) by mouth at bedtime as needed for sleep. 30 tablet 3  . TRUEPLUS LANCETS 28G MISC Use as directed 100 each 6   No current  facility-administered medications on file prior to visit.     Allergies  Allergen Reactions  . Ciprofloxacin Hcl Hives  . Macrobid [Nitrofurantoin Macrocrystal] Hives, Shortness Of Breath and Rash  . Other Hives, Shortness Of Breath and Rash    NO "-CILLINS"!!!  . Penicillins Shortness Of Breath    Had had cephalosporins without incident  . Sulfa Antibiotics Hives, Shortness Of Breath and Rash  . Lexapro [Escitalopram Oxalate]     I just did not like it.    Social History   Socioeconomic History  . Marital status: Single    Spouse name: Not on file  . Number of children: Not on file  . Years of education: Not on file  . Highest education level: Not on file  Occupational History  . Not on file  Social Needs  . Financial resource strain: Not on file  . Food insecurity:    Worry: Not on file    Inability: Not on file  . Transportation needs:    Medical: Not on file    Non-medical: Not on file  Tobacco Use  . Smoking status: Current Some Day Smoker    Packs/day: 0.25    Years: 15.00    Pack years: 3.75    Types: Cigarettes    Last attempt to quit: 03/04/2017    Years since quitting: 0.9  . Smokeless tobacco: Never Used  Substance and Sexual Activity  . Alcohol use: No  . Drug use: Yes    Types: Marijuana    Comment: occasionally  . Sexual activity: Not on file  Lifestyle  . Physical activity:    Days per week: Not on file    Minutes per session: Not on file  . Stress: Not on file  Relationships  . Social connections:    Talks on phone: Not on file    Gets together: Not on file    Attends religious service: Not on file    Active member of club or organization: Not on file    Attends meetings of clubs or organizations: Not on file    Relationship status: Not on file  . Intimate partner violence:    Fear of current or ex partner: Not on file    Emotionally abused: Not on file    Physically abused: Not on file    Forced sexual activity: Not on file  Other  Topics Concern  . Not on file  Social History Narrative  . Not on file    Family History  Problem Relation Age of Onset  . Depression Mother   . Diabetes Father   . Heart failure Father     Past Surgical History:  Procedure Laterality Date  . ABDOMINAL AORTOGRAM W/LOWER EXTREMITY N/A 11/14/2017   Procedure:  ABDOMINAL AORTOGRAM W/LOWER EXTREMITY;  Surgeon: Waynetta Sandy, MD;  Location: [facility];  Service: Cardiovascular;  Laterality: N/A;  . EMBOLECTOMY Left 02/24/2017   Procedure: Left Lower Extremity Embolectomy and Angiogram.;  Surgeon: Waynetta Sandy, MD;  Location: [facility];  Service: Vascular;  Laterality: Left;  . INSERTION OF ILIAC STENT  02/24/2017   Procedure: INSERTION OF Common ILIAC STENT;  Surgeon: Waynetta Sandy, MD;  Location: [facility];  Service: Vascular;;  . LOWER EXTREMITY ANGIOGRAM Left 02/24/2017   Procedure: Aortagram, Left lower extremity Run-off;  Surgeon: Waynetta Sandy, MD;  Location: [facility];  Service: Vascular;  Laterality: Left;  . 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 TUBAL LIGATION      ROS: Review of Systems Negative except as stated above  PHYSICAL EXAM: BP 131/83   Pulse 98   Temp 98 F (36.7 C) (Oral)   Resp 16   Wt 280 lb 9.6 oz (127.3 kg)   SpO2 97%   BMI 43.95 kg/m   Wt Readings from Last 3 Encounters:  01/27/18 280 lb 9.6 oz (127.3 kg)  11/14/17 262 lb (118.8 kg)  11/04/17 278 lb (126.1 kg)   Physical Exam  General appearance - alert, well appearing, obese female and in no distress Mental status - alert, oriented to person, place, and time, normal mood, behavior, speech, dress, motor activity, and thought processes Mouth - mucous membranes moist, pharynx normal without lesions Neck - supple, no significant adenopathy Chest - clear to auscultation, no wheezes, rales or rhonchi, symmetric air entry Heart - normal rate, regular rhythm, normal S1, S2, no murmurs, rubs, clicks or  gallops Abdomen - soft, nontender, nondistended, no masses or organomegaly   BS 348/ A1C 10.3  ASSESSMENT AND PLAN: 1. Uncontrolled type 2 diabetes mellitus without complication, with long-term current use of insulin (HCC) Stop metformin given ongoing GI symptoms.  If GI symptoms persist after stopping metformin, the next step will be gallbladder ultrasound then GI referral if that is negative Add Januvia Increase Lantus to 70 units daily Encourage healthy eating habits Encouraged her to try to walk more - POCT glucose (manual entry) - POCT glycosylated hemoglobin (Hb A1C) - sitaGLIPtin (JANUVIA) 50 MG tablet; Take 1 tablet (50 mg total) by mouth daily.  Dispense: 30 tablet; Refill: 3 - insulin glargine (LANTUS) 100 UNIT/ML injection; Inject 0.7 mLs (70 Units total) into the skin daily.  Dispense: 10 mL; Refill: 11 - CBC - Comprehensive metabolic panel - Lipid panel  2. Anxiety and depression We will resubmit referral to psychiatry once she has been approved for the [facility] card [facility].  In the meantime I have told her of other resources in the area including [place] and family services of the [place] - hydrOXYzine (ATARAX/VISTARIL) 25 MG tablet; Take 2 tablets (50 mg total) by mouth at bedtime. TAKE 1 TABLET BY MOUTH AT BEDTIME AS NEEDED FOR ANXIETY.  Dispense: 60 tablet; Refill: 2  3. Tobacco dependence - buPROPion (BUPROBAN) 150 MG 12 hr tablet; Take 1 tablet (150 mg total) by mouth 2 (two) times daily.  Dispense: 60 tablet; Refill: 2  4. Class 3 severe obesity due to excess calories with serious comorbidity and body mass index (BMI) of 40.0 to 44.9 in adult [facility]) See #1 above  Patient was given the opportunity to ask questions.  Patient verbalized understanding of the plan and was able to repeat key elements of the plan.   Orders Placed This Encounter  Procedures  . CBC  . Comprehensive metabolic panel  . Lipid panel  . POCT glucose (manual entry)  . POCT  glycosylated hemoglobin (Hb A1C)     Requested Prescriptions   Signed Prescriptions Disp Refills  . buPROPion (BUPROBAN) 150 MG 12 hr tablet 60 tablet 2    Sig: Take 1 tablet (150 mg total) by mouth 2 (two) times daily.  . sitaGLIPtin (JANUVIA) 50 MG tablet 30 tablet 3    Sig: Take 1 tablet (50 mg total) by mouth daily.  . hydrOXYzine (ATARAX/VISTARIL) 25 MG tablet 60 tablet 2    Sig: Take 2 tablets (50 mg total) by mouth at bedtime. TAKE 1 TABLET BY MOUTH AT BEDTIME AS NEEDED FOR ANXIETY.  Marland Kitchen insulin glargine (LANTUS) 100 UNIT/ML injection 10 mL 11    Sig: Inject 0.7 mLs (70 Units total) into the skin daily.    Return in about 2 months (around 03/29/2018).  Karle Plumber, MD, FACP

## 2018-01-28 LAB — LIPID PANEL
Chol/HDL Ratio: 4.2 ratio (ref 0.0–4.4)
Cholesterol, Total: 151 mg/dL (ref 100–199)
HDL: 36 mg/dL — ABNORMAL LOW (ref >39)
LDL Calculated: 82 mg/dL (ref 0–99)
Triglycerides: 167 mg/dL — ABNORMAL HIGH (ref 0–149)
VLDL Cholesterol Cal: 33 mg/dL (ref 5–40)

## 2018-01-28 LAB — COMPREHENSIVE METABOLIC PANEL
ALT: 9 IU/L (ref 0–32)
AST: 11 IU/L (ref 0–40)
Albumin/Globulin Ratio: 1.4 (ref 1.2–2.2)
Albumin: 4 g/dL (ref 3.5–5.5)
Alkaline Phosphatase: 119 IU/L — ABNORMAL HIGH (ref 39–117)
BUN/Creatinine Ratio: 10 (ref 9–23)
BUN: 14 mg/dL (ref 6–24)
Bilirubin Total: <0.2 mg/dL (ref 0.0–1.2)
CO2: 17 mmol/L — ABNORMAL LOW (ref 20–29)
Calcium: 9.3 mg/dL (ref 8.7–10.2)
Chloride: 102 mmol/L (ref 96–106)
Creatinine, Ser: 1.45 mg/dL — ABNORMAL HIGH (ref 0.57–1.00)
GFR calc Af Amer: 51 mL/min/{1.73_m2} — ABNORMAL LOW (ref >59)
GFR calc non Af Amer: 44 mL/min/{1.73_m2} — ABNORMAL LOW (ref >59)
Globulin, Total: 2.8 g/dL (ref 1.5–4.5)
Glucose: 359 mg/dL — ABNORMAL HIGH (ref 65–99)
Potassium: 4.7 mmol/L (ref 3.5–5.2)
Sodium: 134 mmol/L (ref 134–144)
Total Protein: 6.8 g/dL (ref 6.0–8.5)

## 2018-01-28 LAB — CBC
Hematocrit: 37.6 % (ref 34.0–46.6)
Hemoglobin: 11 g/dL — ABNORMAL LOW (ref 11.1–15.9)
MCH: 22.9 pg — ABNORMAL LOW (ref 26.6–33.0)
MCHC: 29.3 g/dL — ABNORMAL LOW (ref 31.5–35.7)
MCV: 78 fL — ABNORMAL LOW (ref 79–97)
Platelets: 490 10*3/uL — ABNORMAL HIGH (ref 150–379)
RBC: 4.8 x10E6/uL (ref 3.77–5.28)
RDW: 17.1 % — ABNORMAL HIGH (ref 12.3–15.4)
WBC: 13.8 10*3/uL — ABNORMAL HIGH (ref 3.4–10.8)

## 2018-01-28 NOTE — Addendum Note (Signed)
Addended by: Karle Plumber B on: 01/28/2018 03:21 PM   Modules accepted: Orders

## 2018-01-28 NOTE — Progress Notes (Signed)
Lab message sent to CMA to contact pt: Let pt know that her kidney function function is not 100% and has declined from 1 yr ago.  Please avoid taking any OTC medications except Tylenol as these can make kidney function worse.  Cholesterol level is good.  She is anemic.  I have asked the lab to add iron levels to see if this is iron def anemia.

## 2018-01-30 ENCOUNTER — Telehealth: Payer: Self-pay

## 2018-01-30 NOTE — Telephone Encounter (Signed)
Contacted pt to go over lab results pt is aware and doesn't have any questions or concerns 

## 2018-01-31 ENCOUNTER — Other Ambulatory Visit: Payer: Self-pay | Admitting: Internal Medicine

## 2018-01-31 LAB — IRON,TIBC AND FERRITIN PANEL
Ferritin: 11 ng/mL — ABNORMAL LOW (ref 15–150)
Iron Saturation: 4 % — CL (ref 15–55)
Iron: 18 ug/dL — ABNORMAL LOW (ref 27–159)
Total Iron Binding Capacity: 439 ug/dL (ref 250–450)
UIBC: 421 ug/dL (ref 131–425)

## 2018-01-31 LAB — SPECIMEN STATUS REPORT

## 2018-01-31 MED ORDER — FERROUS SULFATE 325 (65 FE) MG PO TABS: 325.0000 mg | ORAL_TABLET | Freq: Every day | ORAL | 1 refills | Status: DC

## 2018-02-03 ENCOUNTER — Telehealth: Payer: Self-pay

## 2018-02-03 NOTE — Telephone Encounter (Signed)
Contacted pt to go over lab results pt didn't answer left a detailed vm informing pt of results   If pt calls back please give results: she has iron deficiency anemia based on iron studies. I recommend taking iron supplement daily. Prescription has been sent to our pharmacy for pickup.

## 2018-02-24 ENCOUNTER — Inpatient Hospital Stay (HOSPITAL_COMMUNITY)
Admission: EM | Admit: 2018-02-24 | Discharge: 2018-03-07 | DRG: 271 | Disposition: A | Discharge status: Home Health | Payer: Medicaid Other | Attending: Vascular Surgery | Admitting: Vascular Surgery

## 2018-02-24 ENCOUNTER — Other Ambulatory Visit: Payer: Self-pay

## 2018-02-24 DIAGNOSIS — Z794 Long term (current) use of insulin: Secondary | ICD-10-CM

## 2018-02-24 DIAGNOSIS — E1151 Type 2 diabetes mellitus with diabetic peripheral angiopathy without gangrene: Secondary | ICD-10-CM | POA: Diagnosis present

## 2018-02-24 DIAGNOSIS — I743 Embolism and thrombosis of arteries of the lower extremities: Secondary | ICD-10-CM | POA: Diagnosis present

## 2018-02-24 DIAGNOSIS — I70202 Unspecified atherosclerosis of native arteries of extremities, left leg: Secondary | ICD-10-CM | POA: Diagnosis present

## 2018-02-24 DIAGNOSIS — Z818 Family history of other mental and behavioral disorders: Secondary | ICD-10-CM

## 2018-02-24 DIAGNOSIS — F17210 Nicotine dependence, cigarettes, uncomplicated: Secondary | ICD-10-CM | POA: Diagnosis present

## 2018-02-24 DIAGNOSIS — I745 Embolism and thrombosis of iliac artery: Secondary | ICD-10-CM | POA: Diagnosis present

## 2018-02-24 DIAGNOSIS — E1165 Type 2 diabetes mellitus with hyperglycemia: Secondary | ICD-10-CM | POA: Diagnosis present

## 2018-02-24 DIAGNOSIS — I898 Other specified noninfective disorders of lymphatic vessels and lymph nodes: Secondary | ICD-10-CM | POA: Diagnosis not present

## 2018-02-24 DIAGNOSIS — Z882 Allergy status to sulfonamides status: Secondary | ICD-10-CM

## 2018-02-24 DIAGNOSIS — I7090 Unspecified atherosclerosis: Secondary | ICD-10-CM

## 2018-02-24 DIAGNOSIS — K219 Gastro-esophageal reflux disease without esophagitis: Secondary | ICD-10-CM | POA: Diagnosis present

## 2018-02-24 DIAGNOSIS — Z87442 Personal history of urinary calculi: Secondary | ICD-10-CM

## 2018-02-24 DIAGNOSIS — I998 Other disorder of circulatory system: Secondary | ICD-10-CM | POA: Diagnosis present

## 2018-02-24 DIAGNOSIS — Y712 Prosthetic and other implants, materials and accessory cardiovascular devices associated with adverse incidents: Secondary | ICD-10-CM

## 2018-02-24 DIAGNOSIS — Z880 Allergy status to penicillin: Secondary | ICD-10-CM

## 2018-02-24 DIAGNOSIS — Z881 Allergy status to other antibiotic agents status: Secondary | ICD-10-CM

## 2018-02-24 DIAGNOSIS — F419 Anxiety disorder, unspecified: Secondary | ICD-10-CM | POA: Diagnosis present

## 2018-02-24 DIAGNOSIS — Z6839 Body mass index (BMI) 39.0-39.9, adult: Secondary | ICD-10-CM

## 2018-02-24 DIAGNOSIS — Z9851 Tubal ligation status: Secondary | ICD-10-CM

## 2018-02-24 DIAGNOSIS — Z888 Allergy status to other drugs, medicaments and biological substances status: Secondary | ICD-10-CM

## 2018-02-24 DIAGNOSIS — Z8249 Family history of ischemic heart disease and other diseases of the circulatory system: Secondary | ICD-10-CM

## 2018-02-24 DIAGNOSIS — T8131XA Disruption of external operation (surgical) wound, not elsewhere classified, initial encounter: Secondary | ICD-10-CM | POA: Diagnosis not present

## 2018-02-24 DIAGNOSIS — I749 Embolism and thrombosis of unspecified artery: Secondary | ICD-10-CM | POA: Diagnosis present

## 2018-02-24 DIAGNOSIS — Z86718 Personal history of other venous thrombosis and embolism: Secondary | ICD-10-CM

## 2018-02-24 DIAGNOSIS — T82868A Thrombosis of vascular prosthetic devices, implants and grafts, initial encounter: Principal | ICD-10-CM | POA: Diagnosis present

## 2018-02-24 DIAGNOSIS — Z833 Family history of diabetes mellitus: Secondary | ICD-10-CM

## 2018-02-25 ENCOUNTER — Encounter (HOSPITAL_COMMUNITY)
Admission: EM | Disposition: A | Discharge status: Home Health | Payer: Self-pay | Source: Home / Self Care | Attending: Vascular Surgery

## 2018-02-25 ENCOUNTER — Emergency Department (HOSPITAL_COMMUNITY): Payer: Medicaid Other | Admitting: Certified Registered Nurse Anesthetist

## 2018-02-25 ENCOUNTER — Encounter (HOSPITAL_COMMUNITY): Payer: Self-pay | Admitting: Emergency Medicine

## 2018-02-25 ENCOUNTER — Emergency Department (HOSPITAL_COMMUNITY): Payer: Medicaid Other

## 2018-02-25 DIAGNOSIS — Z881 Allergy status to other antibiotic agents status: Secondary | ICD-10-CM | POA: Diagnosis not present

## 2018-02-25 DIAGNOSIS — I70202 Unspecified atherosclerosis of native arteries of extremities, left leg: Secondary | ICD-10-CM | POA: Diagnosis present

## 2018-02-25 DIAGNOSIS — Z6839 Body mass index (BMI) 39.0-39.9, adult: Secondary | ICD-10-CM | POA: Diagnosis not present

## 2018-02-25 DIAGNOSIS — E1165 Type 2 diabetes mellitus with hyperglycemia: Secondary | ICD-10-CM | POA: Diagnosis present

## 2018-02-25 DIAGNOSIS — Z794 Long term (current) use of insulin: Secondary | ICD-10-CM | POA: Diagnosis not present

## 2018-02-25 DIAGNOSIS — T82868A Thrombosis of vascular prosthetic devices, implants and grafts, initial encounter: Secondary | ICD-10-CM | POA: Diagnosis present

## 2018-02-25 DIAGNOSIS — K219 Gastro-esophageal reflux disease without esophagitis: Secondary | ICD-10-CM | POA: Diagnosis present

## 2018-02-25 DIAGNOSIS — Z8249 Family history of ischemic heart disease and other diseases of the circulatory system: Secondary | ICD-10-CM | POA: Diagnosis not present

## 2018-02-25 DIAGNOSIS — Z86718 Personal history of other venous thrombosis and embolism: Secondary | ICD-10-CM | POA: Diagnosis not present

## 2018-02-25 DIAGNOSIS — Z87442 Personal history of urinary calculi: Secondary | ICD-10-CM | POA: Diagnosis not present

## 2018-02-25 DIAGNOSIS — Z833 Family history of diabetes mellitus: Secondary | ICD-10-CM | POA: Diagnosis not present

## 2018-02-25 DIAGNOSIS — I749 Embolism and thrombosis of unspecified artery: Secondary | ICD-10-CM | POA: Diagnosis present

## 2018-02-25 DIAGNOSIS — F419 Anxiety disorder, unspecified: Secondary | ICD-10-CM | POA: Diagnosis present

## 2018-02-25 DIAGNOSIS — Z888 Allergy status to other drugs, medicaments and biological substances status: Secondary | ICD-10-CM | POA: Diagnosis not present

## 2018-02-25 DIAGNOSIS — I743 Embolism and thrombosis of arteries of the lower extremities: Secondary | ICD-10-CM | POA: Diagnosis present

## 2018-02-25 DIAGNOSIS — I898 Other specified noninfective disorders of lymphatic vessels and lymph nodes: Secondary | ICD-10-CM | POA: Diagnosis not present

## 2018-02-25 DIAGNOSIS — Z880 Allergy status to penicillin: Secondary | ICD-10-CM | POA: Diagnosis not present

## 2018-02-25 DIAGNOSIS — I998 Other disorder of circulatory system: Secondary | ICD-10-CM | POA: Diagnosis present

## 2018-02-25 DIAGNOSIS — E1151 Type 2 diabetes mellitus with diabetic peripheral angiopathy without gangrene: Secondary | ICD-10-CM | POA: Diagnosis present

## 2018-02-25 DIAGNOSIS — Z882 Allergy status to sulfonamides status: Secondary | ICD-10-CM | POA: Diagnosis not present

## 2018-02-25 DIAGNOSIS — Y712 Prosthetic and other implants, materials and accessory cardiovascular devices associated with adverse incidents: Secondary | ICD-10-CM | POA: Diagnosis not present

## 2018-02-25 DIAGNOSIS — I70222 Atherosclerosis of native arteries of extremities with rest pain, left leg: Secondary | ICD-10-CM

## 2018-02-25 DIAGNOSIS — I7090 Unspecified atherosclerosis: Secondary | ICD-10-CM | POA: Diagnosis present

## 2018-02-25 DIAGNOSIS — I745 Embolism and thrombosis of iliac artery: Secondary | ICD-10-CM | POA: Diagnosis present

## 2018-02-25 DIAGNOSIS — F17210 Nicotine dependence, cigarettes, uncomplicated: Secondary | ICD-10-CM | POA: Diagnosis present

## 2018-02-25 DIAGNOSIS — T8131XA Disruption of external operation (surgical) wound, not elsewhere classified, initial encounter: Secondary | ICD-10-CM | POA: Diagnosis not present

## 2018-02-25 DIAGNOSIS — Z9851 Tubal ligation status: Secondary | ICD-10-CM | POA: Diagnosis not present

## 2018-02-25 HISTORY — PX: THROMBECTOMY FEMORAL ARTERY: SHX6406

## 2018-02-25 HISTORY — PX: INTRAOPERATIVE ARTERIOGRAM: SHX5157

## 2018-02-25 HISTORY — PX: ANGIOPLASTY ILLIAC ARTERY: SHX5720

## 2018-02-25 HISTORY — PX: PATCH ANGIOPLASTY: SHX6230

## 2018-02-25 LAB — APTT: aPTT: 28 seconds (ref 24–36)

## 2018-02-25 LAB — POCT I-STAT 4, (NA,K, GLUC, HGB,HCT)
Glucose, Bld: 293 mg/dL — ABNORMAL HIGH (ref 65–99)
Glucose, Bld: 138 mg/dL — ABNORMAL HIGH (ref 65–99)
HCT: 36 % (ref 36.0–46.0)
HCT: 32 % — ABNORMAL LOW (ref 36.0–46.0)
Hemoglobin: 12.2 g/dL (ref 12.0–15.0)
Hemoglobin: 10.9 g/dL — ABNORMAL LOW (ref 12.0–15.0)
Potassium: 4.2 mmol/L (ref 3.5–5.1)
Potassium: 4.3 mmol/L (ref 3.5–5.1)
Sodium: 134 mmol/L — ABNORMAL LOW (ref 135–145)
Sodium: 136 mmol/L (ref 135–145)

## 2018-02-25 LAB — I-STAT CHEM 8, ED
BUN: 19 mg/dL (ref 6–20)
Calcium, Ion: 1.21 mmol/L (ref 1.15–1.40)
Chloride: 100 mmol/L — ABNORMAL LOW (ref 101–111)
Creatinine, Ser: 1.5 mg/dL — ABNORMAL HIGH (ref 0.44–1.00)
Glucose, Bld: 347 mg/dL — ABNORMAL HIGH (ref 65–99)
HCT: 42 % (ref 36.0–46.0)
Hemoglobin: 14.3 g/dL (ref 12.0–15.0)
Potassium: 4 mmol/L (ref 3.5–5.1)
Sodium: 133 mmol/L — ABNORMAL LOW (ref 135–145)
TCO2: 20 mmol/L — ABNORMAL LOW (ref 22–32)

## 2018-02-25 LAB — GLUCOSE, CAPILLARY
Glucose-Capillary: 157 mg/dL — ABNORMAL HIGH (ref 65–99)
Glucose-Capillary: 186 mg/dL — ABNORMAL HIGH (ref 65–99)
Glucose-Capillary: 297 mg/dL — ABNORMAL HIGH (ref 65–99)

## 2018-02-25 LAB — BASIC METABOLIC PANEL
Anion gap: 13 (ref 5–15)
BUN: 19 mg/dL (ref 6–20)
CO2: 19 mmol/L — ABNORMAL LOW (ref 22–32)
Calcium: 9.8 mg/dL (ref 8.9–10.3)
Chloride: 99 mmol/L — ABNORMAL LOW (ref 101–111)
Creatinine, Ser: 1.63 mg/dL — ABNORMAL HIGH (ref 0.44–1.00)
GFR calc Af Amer: 44 mL/min — ABNORMAL LOW (ref >60)
GFR calc non Af Amer: 38 mL/min — ABNORMAL LOW (ref >60)
Glucose, Bld: 344 mg/dL — ABNORMAL HIGH (ref 65–99)
Potassium: 3.9 mmol/L (ref 3.5–5.1)
Sodium: 131 mmol/L — ABNORMAL LOW (ref 135–145)

## 2018-02-25 LAB — HEPARIN LEVEL (UNFRACTIONATED)
Heparin Unfractionated: 0.72 IU/mL — ABNORMAL HIGH (ref 0.30–0.70)
Heparin Unfractionated: 0.49 IU/mL (ref 0.30–0.70)

## 2018-02-25 LAB — CBC WITH DIFFERENTIAL/PLATELET
Abs Immature Granulocytes: 0.1 10*3/uL (ref 0.0–0.1)
Basophils Absolute: 0.1 10*3/uL (ref 0.0–0.1)
Basophils Relative: 1 %
Eosinophils Absolute: 0.3 10*3/uL (ref 0.0–0.7)
Eosinophils Relative: 2 %
HCT: 40 % (ref 36.0–46.0)
Hemoglobin: 12.1 g/dL (ref 12.0–15.0)
Immature Granulocytes: 1 %
Lymphocytes Relative: 20 %
Lymphs Abs: 3.8 10*3/uL (ref 0.7–4.0)
MCH: 23.2 pg — ABNORMAL LOW (ref 26.0–34.0)
MCHC: 30.3 g/dL (ref 30.0–36.0)
MCV: 76.6 fL — ABNORMAL LOW (ref 78.0–100.0)
Monocytes Absolute: 1 10*3/uL (ref 0.1–1.0)
Monocytes Relative: 5 %
Neutro Abs: 13.4 10*3/uL — ABNORMAL HIGH (ref 1.7–7.7)
Neutrophils Relative %: 71 %
Platelets: 491 10*3/uL — ABNORMAL HIGH (ref 150–400)
RBC: 5.22 MIL/uL — ABNORMAL HIGH (ref 3.87–5.11)
RDW: 16.9 % — ABNORMAL HIGH (ref 11.5–15.5)
WBC: 18.6 10*3/uL — ABNORMAL HIGH (ref 4.0–10.5)

## 2018-02-25 LAB — RAPID URINE DRUG SCREEN, HOSP PERFORMED
Amphetamines: NOT DETECTED
Barbiturates: NOT DETECTED
Benzodiazepines: POSITIVE — AB
Cocaine: POSITIVE — AB
Opiates: POSITIVE — AB
Tetrahydrocannabinol: POSITIVE — AB

## 2018-02-25 LAB — I-STAT BETA HCG BLOOD, ED (MC, WL, AP ONLY): I-stat hCG, quantitative: <5 m[IU]/mL (ref <5)

## 2018-02-25 LAB — PROTIME-INR
INR: 0.98
Prothrombin Time: 12.9 seconds (ref 11.4–15.2)

## 2018-02-25 LAB — PREPARE RBC (CROSSMATCH)

## 2018-02-25 LAB — POCT ACTIVATED CLOTTING TIME: Activated Clotting Time: 147 seconds

## 2018-02-25 SURGERY — THROMBECTOMY, ARTERY, FEMORAL
Anesthesia: General | Laterality: Left

## 2018-02-25 MED ORDER — HEPARIN BOLUS VIA INFUSION
5000.0000 [IU] | Freq: Once | INTRAVENOUS | Status: AC
  Administered 2018-02-25: 5000 [IU] via INTRAVENOUS
  Filled 2018-02-25: qty 5000

## 2018-02-25 MED ORDER — ONDANSETRON HCL 4 MG/2ML IJ SOLN
INTRAMUSCULAR | Status: DC | PRN
  Administered 2018-02-25: 4 mg via INTRAVENOUS

## 2018-02-25 MED ORDER — INSULIN ASPART 100 UNIT/ML ~~LOC~~ SOLN
SUBCUTANEOUS | Status: AC
  Filled 2018-02-25: qty 1

## 2018-02-25 MED ORDER — MORPHINE SULFATE (PF) 4 MG/ML IV SOLN
4.0000 mg | Freq: Once | INTRAVENOUS | Status: AC
  Administered 2018-02-25: 4 mg via INTRAVENOUS
  Filled 2018-02-25: qty 1

## 2018-02-25 MED ORDER — BUPROPION HCL ER (SMOKING DET) 150 MG PO TB12: 150.0000 mg | ORAL_TABLET | Freq: Two times a day (BID) | ORAL | Status: DC

## 2018-02-25 MED ORDER — VANCOMYCIN HCL IN DEXTROSE 1-5 GM/200ML-% IV SOLN
1000.0000 mg | Freq: Two times a day (BID) | INTRAVENOUS | Status: AC
  Administered 2018-02-25 – 2018-02-26 (×2): 1000 mg via INTRAVENOUS
  Filled 2018-02-25 (×2): qty 200

## 2018-02-25 MED ORDER — PROPOFOL 10 MG/ML IV BOLUS
INTRAVENOUS | Status: AC
  Filled 2018-02-25: qty 20

## 2018-02-25 MED ORDER — GUAIFENESIN-DM 100-10 MG/5ML PO SYRP: 15.0000 mL | ORAL_SOLUTION | ORAL | Status: DC | PRN

## 2018-02-25 MED ORDER — LIDOCAINE 2% (20 MG/ML) 5 ML SYRINGE
INTRAMUSCULAR | Status: DC | PRN
  Administered 2018-02-25: 60 mg via INTRAVENOUS

## 2018-02-25 MED ORDER — CLOPIDOGREL BISULFATE 75 MG PO TABS
75.0000 mg | ORAL_TABLET | Freq: Every day | ORAL | Status: DC
  Administered 2018-02-25 – 2018-03-07 (×11): 75 mg via ORAL
  Filled 2018-02-25 (×11): qty 1

## 2018-02-25 MED ORDER — ACETAMINOPHEN 10 MG/ML IV SOLN
INTRAVENOUS | Status: DC | PRN
  Administered 2018-02-25: 1000 mg via INTRAVENOUS

## 2018-02-25 MED ORDER — SUGAMMADEX SODIUM 200 MG/2ML IV SOLN
INTRAVENOUS | Status: AC
  Filled 2018-02-25: qty 4

## 2018-02-25 MED ORDER — FERROUS SULFATE 325 (65 FE) MG PO TABS
325.0000 mg | ORAL_TABLET | Freq: Every day | ORAL | Status: DC
  Administered 2018-02-26 – 2018-03-07 (×10): 325 mg via ORAL
  Filled 2018-02-25 (×10): qty 1

## 2018-02-25 MED ORDER — INSULIN ASPART 100 UNIT/ML ~~LOC~~ SOLN
0.0000 [IU] | Freq: Three times a day (TID) | SUBCUTANEOUS | Status: DC
  Administered 2018-02-26: 5 [IU] via SUBCUTANEOUS
  Administered 2018-02-26: 11 [IU] via SUBCUTANEOUS
  Administered 2018-02-26: 8 [IU] via SUBCUTANEOUS
  Administered 2018-02-27: 3 [IU] via SUBCUTANEOUS
  Administered 2018-02-27: 8 [IU] via SUBCUTANEOUS
  Administered 2018-02-27: 11 [IU] via SUBCUTANEOUS
  Administered 2018-02-28: 3 [IU] via SUBCUTANEOUS
  Administered 2018-02-28 (×2): 5 [IU] via SUBCUTANEOUS
  Administered 2018-03-01: 3 [IU] via SUBCUTANEOUS
  Administered 2018-03-01 (×2): 2 [IU] via SUBCUTANEOUS
  Administered 2018-03-02: 3 [IU] via SUBCUTANEOUS
  Administered 2018-03-03: 11 [IU] via SUBCUTANEOUS
  Administered 2018-03-04 (×2): 3 [IU] via SUBCUTANEOUS
  Administered 2018-03-04: 5 [IU] via SUBCUTANEOUS
  Administered 2018-03-05 (×2): 2 [IU] via SUBCUTANEOUS
  Administered 2018-03-06: 3 [IU] via SUBCUTANEOUS
  Administered 2018-03-06: 2 [IU] via SUBCUTANEOUS
  Administered 2018-03-06 – 2018-03-07 (×3): 3 [IU] via SUBCUTANEOUS

## 2018-02-25 MED ORDER — MORPHINE SULFATE (PF) 2 MG/ML IV SOLN
2.0000 mg | INTRAVENOUS | Status: DC | PRN
  Administered 2018-02-25 – 2018-03-07 (×40): 2 mg via INTRAVENOUS
  Filled 2018-02-25 (×41): qty 1

## 2018-02-25 MED ORDER — PROTAMINE SULFATE 10 MG/ML IV SOLN
INTRAVENOUS | Status: DC | PRN
  Administered 2018-02-25: 40 mg via INTRAVENOUS
  Administered 2018-02-25: 10 mg via INTRAVENOUS

## 2018-02-25 MED ORDER — ONDANSETRON HCL 4 MG/2ML IJ SOLN
4.0000 mg | Freq: Once | INTRAMUSCULAR | Status: AC
  Administered 2018-02-25: 4 mg via INTRAVENOUS
  Filled 2018-02-25: qty 2

## 2018-02-25 MED ORDER — SODIUM CHLORIDE 0.9 % IV SOLN
INTRAVENOUS | Status: DC | PRN
  Administered 2018-02-25: 500 mL

## 2018-02-25 MED ORDER — BISACODYL 10 MG RE SUPP: 10.0000 mg | Freq: Every day | RECTAL | Status: DC | PRN

## 2018-02-25 MED ORDER — TRAZODONE HCL 50 MG PO TABS
25.0000 mg | ORAL_TABLET | Freq: Every evening | ORAL | Status: DC | PRN
  Administered 2018-02-26 – 2018-03-06 (×8): 25 mg via ORAL
  Filled 2018-02-25 (×8): qty 1

## 2018-02-25 MED ORDER — FENTANYL CITRATE (PF) 250 MCG/5ML IJ SOLN
INTRAMUSCULAR | Status: DC | PRN
  Administered 2018-02-25 (×10): 50 ug via INTRAVENOUS

## 2018-02-25 MED ORDER — INSULIN ASPART 100 UNIT/ML ~~LOC~~ SOLN
SUBCUTANEOUS | Status: DC | PRN
  Administered 2018-02-25: 20 [IU] via SUBCUTANEOUS

## 2018-02-25 MED ORDER — HYDROXYZINE HCL 25 MG PO TABS
50.0000 mg | ORAL_TABLET | Freq: Every evening | ORAL | Status: DC | PRN
  Administered 2018-02-28 – 2018-03-01 (×2): 50 mg via ORAL
  Filled 2018-02-25 (×2): qty 2

## 2018-02-25 MED ORDER — 0.9 % SODIUM CHLORIDE (POUR BTL) OPTIME
TOPICAL | Status: DC | PRN
  Administered 2018-02-25: 2000 mL

## 2018-02-25 MED ORDER — GABAPENTIN 300 MG PO CAPS
300.0000 mg | ORAL_CAPSULE | Freq: Two times a day (BID) | ORAL | Status: DC
  Administered 2018-02-25 – 2018-03-07 (×20): 300 mg via ORAL
  Filled 2018-02-25 (×20): qty 1

## 2018-02-25 MED ORDER — SODIUM CHLORIDE 0.9 % IJ SOLN
INTRAMUSCULAR | Status: DC | PRN
  Administered 2018-02-25 (×3): 50 mL via INTRAMUSCULAR

## 2018-02-25 MED ORDER — HEPARIN SODIUM (PORCINE) 1000 UNIT/ML IJ SOLN
INTRAMUSCULAR | Status: DC | PRN
  Administered 2018-02-25: 12000 [IU] via INTRAVENOUS
  Administered 2018-02-25: 2000 [IU] via INTRAVENOUS

## 2018-02-25 MED ORDER — ROCURONIUM BROMIDE 10 MG/ML (PF) SYRINGE
PREFILLED_SYRINGE | INTRAVENOUS | Status: DC | PRN
  Administered 2018-02-25: 20 mg via INTRAVENOUS
  Administered 2018-02-25: 50 mg via INTRAVENOUS
  Administered 2018-02-25 (×2): 20 mg via INTRAVENOUS
  Administered 2018-02-25: 10 mg via INTRAVENOUS

## 2018-02-25 MED ORDER — OXYCODONE HCL 5 MG PO TABS
5.0000 mg | ORAL_TABLET | ORAL | Status: DC | PRN
Start: 2018-02-25 — End: 2018-03-07
  Administered 2018-02-25 – 2018-03-07 (×44): 10 mg via ORAL
  Filled 2018-02-25 (×45): qty 2

## 2018-02-25 MED ORDER — PHENOL 1.4 % MT LIQD
1.0000 | OROMUCOSAL | Status: DC | PRN
  Administered 2018-02-26: 1 via OROMUCOSAL
  Filled 2018-02-25: qty 177

## 2018-02-25 MED ORDER — SODIUM CHLORIDE 0.9 % IV SOLN
Freq: Once | INTRAVENOUS | Status: AC
  Administered 2018-02-25: 14:00:00 via INTRAVENOUS

## 2018-02-25 MED ORDER — FENTANYL CITRATE (PF) 250 MCG/5ML IJ SOLN
INTRAMUSCULAR | Status: AC
  Filled 2018-02-25: qty 5

## 2018-02-25 MED ORDER — HEPARIN (PORCINE) IN NACL 100-0.45 UNIT/ML-% IJ SOLN
2000.0000 [IU]/h | INTRAMUSCULAR | Status: DC
  Administered 2018-02-25: 2000 [IU]/h via INTRAVENOUS
  Filled 2018-02-25 (×2): qty 250

## 2018-02-25 MED ORDER — DOCUSATE SODIUM 100 MG PO CAPS
100.0000 mg | ORAL_CAPSULE | Freq: Every day | ORAL | Status: DC
  Administered 2018-02-26 – 2018-03-07 (×10): 100 mg via ORAL
  Filled 2018-02-25 (×10): qty 1

## 2018-02-25 MED ORDER — POTASSIUM CHLORIDE CRYS ER 20 MEQ PO TBCR: 20.0000 meq | EXTENDED_RELEASE_TABLET | Freq: Every day | ORAL | Status: DC | PRN

## 2018-02-25 MED ORDER — MIDAZOLAM HCL 2 MG/2ML IJ SOLN
INTRAMUSCULAR | Status: AC
  Filled 2018-02-25: qty 2

## 2018-02-25 MED ORDER — ONDANSETRON HCL 4 MG/2ML IJ SOLN
INTRAMUSCULAR | Status: AC
  Filled 2018-02-25: qty 2

## 2018-02-25 MED ORDER — ALBUTEROL SULFATE HFA 108 (90 BASE) MCG/ACT IN AERS
INHALATION_SPRAY | RESPIRATORY_TRACT | Status: DC | PRN
  Administered 2018-02-25: 2 via RESPIRATORY_TRACT

## 2018-02-25 MED ORDER — CYCLOBENZAPRINE HCL 10 MG PO TABS
5.0000 mg | ORAL_TABLET | Freq: Three times a day (TID) | ORAL | Status: DC | PRN
  Administered 2018-02-26 – 2018-03-07 (×17): 5 mg via ORAL
  Filled 2018-02-25 (×19): qty 1

## 2018-02-25 MED ORDER — LORAZEPAM 1 MG PO TABS
1.0000 mg | ORAL_TABLET | Freq: Every evening | ORAL | Status: DC | PRN
  Administered 2018-02-26 – 2018-03-06 (×10): 1 mg via ORAL
  Filled 2018-02-25 (×10): qty 1

## 2018-02-25 MED ORDER — ROCURONIUM BROMIDE 10 MG/ML (PF) SYRINGE
PREFILLED_SYRINGE | INTRAVENOUS | Status: AC
  Filled 2018-02-25: qty 10

## 2018-02-25 MED ORDER — METOPROLOL TARTRATE 5 MG/5ML IV SOLN: 2.0000 mg | INTRAVENOUS | Status: DC | PRN

## 2018-02-25 MED ORDER — HEPARIN (PORCINE) IN NACL 100-0.45 UNIT/ML-% IJ SOLN
1850.0000 [IU]/h | INTRAMUSCULAR | Status: DC
  Administered 2018-02-25 – 2018-02-26 (×2): 1850 [IU]/h via INTRAVENOUS
  Filled 2018-02-25 (×2): qty 250

## 2018-02-25 MED ORDER — ALUM & MAG HYDROXIDE-SIMETH 200-200-20 MG/5ML PO SUSP
15.0000 mL | ORAL | Status: DC | PRN
  Administered 2018-02-26 – 2018-03-07 (×17): 30 mL via ORAL
  Filled 2018-02-25 (×17): qty 30

## 2018-02-25 MED ORDER — LACTATED RINGERS IV SOLN
INTRAVENOUS | Status: DC | PRN
  Administered 2018-02-25 (×2): via INTRAVENOUS

## 2018-02-25 MED ORDER — ONDANSETRON HCL 4 MG/2ML IJ SOLN
4.0000 mg | Freq: Four times a day (QID) | INTRAMUSCULAR | Status: DC | PRN
  Administered 2018-02-26 – 2018-03-07 (×12): 4 mg via INTRAVENOUS
  Filled 2018-02-25 (×12): qty 2

## 2018-02-25 MED ORDER — SODIUM CHLORIDE 0.9 % IV SOLN
INTRAVENOUS | Status: DC
  Administered 2018-02-25 – 2018-02-26 (×2): via INTRAVENOUS

## 2018-02-25 MED ORDER — LACTATED RINGERS IV SOLN
INTRAVENOUS | Status: DC | PRN
  Administered 2018-02-25: 07:00:00 via INTRAVENOUS

## 2018-02-25 MED ORDER — SODIUM CHLORIDE 0.9 % IV SOLN: 500.0000 mL | Freq: Once | INTRAVENOUS | Status: DC | PRN

## 2018-02-25 MED ORDER — VANCOMYCIN HCL IN DEXTROSE 1-5 GM/200ML-% IV SOLN
INTRAVENOUS | Status: AC
  Filled 2018-02-25: qty 200

## 2018-02-25 MED ORDER — PROPOFOL 10 MG/ML IV BOLUS
INTRAVENOUS | Status: DC | PRN
  Administered 2018-02-25: 200 mg via INTRAVENOUS

## 2018-02-25 MED ORDER — LORAZEPAM 2 MG/ML IJ SOLN
1.0000 mg | Freq: Once | INTRAMUSCULAR | Status: AC
  Administered 2018-02-25: 1 mg via INTRAVENOUS
  Filled 2018-02-25: qty 1

## 2018-02-25 MED ORDER — ACETAMINOPHEN 325 MG RE SUPP: 325.0000 mg | RECTAL | Status: DC | PRN

## 2018-02-25 MED ORDER — ACETAMINOPHEN 10 MG/ML IV SOLN
INTRAVENOUS | Status: AC
  Filled 2018-02-25: qty 100

## 2018-02-25 MED ORDER — PANTOPRAZOLE SODIUM 40 MG PO TBEC
40.0000 mg | DELAYED_RELEASE_TABLET | Freq: Every day | ORAL | Status: DC
  Administered 2018-02-25 – 2018-03-07 (×10): 40 mg via ORAL
  Filled 2018-02-25 (×11): qty 1

## 2018-02-25 MED ORDER — CEFAZOLIN SODIUM-DEXTROSE 2-4 GM/100ML-% IV SOLN: 2.0000 g | Freq: Once | INTRAVENOUS | Status: DC

## 2018-02-25 MED ORDER — HEMOSTATIC AGENTS (NO CHARGE) OPTIME
TOPICAL | Status: DC | PRN
  Administered 2018-02-25: 1 via TOPICAL

## 2018-02-25 MED ORDER — INSULIN ASPART 100 UNIT/ML ~~LOC~~ SOLN
0.0000 [IU] | Freq: Every day | SUBCUTANEOUS | Status: DC
  Administered 2018-02-25 – 2018-03-03 (×3): 3 [IU] via SUBCUTANEOUS

## 2018-02-25 MED ORDER — ACETAMINOPHEN 325 MG PO TABS
325.0000 mg | ORAL_TABLET | ORAL | Status: DC | PRN
  Administered 2018-02-26 – 2018-02-27 (×4): 650 mg via ORAL
  Administered 2018-02-27: 325 mg via ORAL
  Administered 2018-02-27 – 2018-03-02 (×12): 650 mg via ORAL
  Administered 2018-03-02: 325 mg via ORAL
  Administered 2018-03-03 – 2018-03-07 (×9): 650 mg via ORAL
  Filled 2018-02-25 (×27): qty 2

## 2018-02-25 MED ORDER — MAGNESIUM SULFATE 2 GM/50ML IV SOLN: 2.0000 g | Freq: Every day | INTRAVENOUS | Status: DC | PRN

## 2018-02-25 MED ORDER — VANCOMYCIN HCL IN DEXTROSE 1-5 GM/200ML-% IV SOLN
1000.0000 mg | Freq: Once | INTRAVENOUS | Status: AC
  Administered 2018-02-25: 1000 mg via INTRAVENOUS

## 2018-02-25 MED ORDER — MIDAZOLAM HCL 2 MG/2ML IJ SOLN
INTRAMUSCULAR | Status: DC | PRN
  Administered 2018-02-25: 2 mg via INTRAVENOUS

## 2018-02-25 MED ORDER — LISINOPRIL 2.5 MG PO TABS
2.5000 mg | ORAL_TABLET | Freq: Every day | ORAL | Status: DC
  Administered 2018-02-25 – 2018-03-07 (×11): 2.5 mg via ORAL
  Filled 2018-02-25 (×11): qty 1

## 2018-02-25 MED ORDER — IOPAMIDOL (ISOVUE-370) INJECTION 76%
80.0000 mL | Freq: Once | INTRAVENOUS | Status: AC | PRN
  Administered 2018-02-25: 80 mL via INTRAVENOUS

## 2018-02-25 MED ORDER — SUCCINYLCHOLINE CHLORIDE 200 MG/10ML IV SOSY
PREFILLED_SYRINGE | INTRAVENOUS | Status: DC | PRN
  Administered 2018-02-25: 120 mg via INTRAVENOUS

## 2018-02-25 MED ORDER — NICOTINE 21 MG/24HR TD PT24
21.0000 mg | MEDICATED_PATCH | Freq: Every day | TRANSDERMAL | Status: DC
  Administered 2018-02-25 – 2018-03-07 (×10): 21 mg via TRANSDERMAL
  Filled 2018-02-25 (×11): qty 1

## 2018-02-25 MED ORDER — SUGAMMADEX SODIUM 200 MG/2ML IV SOLN
INTRAVENOUS | Status: DC | PRN
  Administered 2018-02-25: 250 mg via INTRAVENOUS

## 2018-02-25 MED ORDER — SODIUM CHLORIDE 0.9 % IV SOLN
INTRAVENOUS | Status: AC
  Filled 2018-02-25: qty 1.2

## 2018-02-25 MED ORDER — HYDRALAZINE HCL 20 MG/ML IJ SOLN: 5.0000 mg | INTRAMUSCULAR | Status: DC | PRN

## 2018-02-25 MED ORDER — SENNOSIDES-DOCUSATE SODIUM 8.6-50 MG PO TABS
1.0000 | ORAL_TABLET | Freq: Every evening | ORAL | Status: DC | PRN
  Administered 2018-02-26 – 2018-02-27 (×2): 1 via ORAL
  Filled 2018-02-25 (×2): qty 1

## 2018-02-25 MED ORDER — FLEET ENEMA 7-19 GM/118ML RE ENEM: 1.0000 | ENEMA | Freq: Once | RECTAL | Status: DC | PRN

## 2018-02-25 MED ORDER — LABETALOL HCL 5 MG/ML IV SOLN: 10.0000 mg | INTRAVENOUS | Status: DC | PRN

## 2018-02-25 SURGICAL SUPPLY — 80 items
BALLN MUSTANG 8.0X40 75 (BALLOONS) ×2
BALLOON MUSTANG 8.0X40 75 (BALLOONS) ×1 IMPLANT
BANDAGE ACE 4X5 VEL STRL LF (GAUZE/BANDAGES/DRESSINGS) IMPLANT
BANDAGE ESMARK 6X9 LF (GAUZE/BANDAGES/DRESSINGS) IMPLANT
BNDG ESMARK 6X9 LF (GAUZE/BANDAGES/DRESSINGS)
CANISTER SUCT 3000ML PPV (MISCELLANEOUS) ×2 IMPLANT
CATH EMB 3FR 40CM (CATHETERS) ×2 IMPLANT
CATH EMB 3FR 80CM (CATHETERS) ×2 IMPLANT
CATH EMB 4FR 80CM (CATHETERS) ×2 IMPLANT
CATH SOFT-VU 4F 65 STRAIGHT (CATHETERS) ×1 IMPLANT
CATH SOFT-VU STRAIGHT 4F 65CM (CATHETERS) ×1
CLIP VESOCCLUDE MED 24/CT (CLIP) ×2 IMPLANT
CLIP VESOCCLUDE SM WIDE 24/CT (CLIP) ×2 IMPLANT
COVER DOME SNAP 22 D (MISCELLANEOUS) ×2 IMPLANT
COVER PROBE W GEL 5X96 (DRAPES) ×2 IMPLANT
CUFF TOURNIQUET SINGLE 24IN (TOURNIQUET CUFF) IMPLANT
CUFF TOURNIQUET SINGLE 34IN LL (TOURNIQUET CUFF) IMPLANT
CUFF TOURNIQUET SINGLE 44IN (TOURNIQUET CUFF) IMPLANT
DERMABOND ADVANCED (GAUZE/BANDAGES/DRESSINGS) ×1
DERMABOND ADVANCED .7 DNX12 (GAUZE/BANDAGES/DRESSINGS) ×1 IMPLANT
DEVICE INFLATION ENCORE 26 (MISCELLANEOUS) ×2 IMPLANT
DRAIN CHANNEL 15F RND FF W/TCR (WOUND CARE) ×2 IMPLANT
DRAPE C-ARM 42X72 X-RAY (DRAPES) IMPLANT
DRSG COVADERM 4X6 (GAUZE/BANDAGES/DRESSINGS) ×2 IMPLANT
ELECT REM PT RETURN 9FT ADLT (ELECTROSURGICAL) ×2
ELECTRODE REM PT RTRN 9FT ADLT (ELECTROSURGICAL) ×1 IMPLANT
EVACUATOR SILICONE 100CC (DRAIN) ×2 IMPLANT
GAUZE SPONGE 4X4 12PLY STRL LF (GAUZE/BANDAGES/DRESSINGS) ×2 IMPLANT
GLOVE BIO SURGEON STRL SZ7 (GLOVE) ×6 IMPLANT
GLOVE BIOGEL M 7.0 STRL (GLOVE) ×4 IMPLANT
GLOVE BIOGEL PI IND STRL 6.5 (GLOVE) ×3 IMPLANT
GLOVE BIOGEL PI IND STRL 7.5 (GLOVE) ×6 IMPLANT
GLOVE BIOGEL PI INDICATOR 6.5 (GLOVE) ×3
GLOVE BIOGEL PI INDICATOR 7.5 (GLOVE) ×6
GLOVE SURG SS PI 6.5 STRL IVOR (GLOVE) ×4 IMPLANT
GLOVE SURG SS PI 7.5 STRL IVOR (GLOVE) ×6 IMPLANT
GOWN STRL REUS W/ TWL LRG LVL3 (GOWN DISPOSABLE) ×7 IMPLANT
GOWN STRL REUS W/TWL LRG LVL3 (GOWN DISPOSABLE) ×7
HEMOSTAT SPONGE AVITENE ULTRA (HEMOSTASIS) ×2 IMPLANT
INSERT FOGARTY SM (MISCELLANEOUS) IMPLANT
INTRODUCER AVANTI 6FR (MISCELLANEOUS) ×2 IMPLANT
KIT BASIN OR (CUSTOM PROCEDURE TRAY) ×2 IMPLANT
KIT TURNOVER KIT B (KITS) ×2 IMPLANT
MARKER GRAFT CORONARY BYPASS (MISCELLANEOUS) IMPLANT
NEEDLE PERC 18GX7CM (NEEDLE) ×2 IMPLANT
NS IRRIG 1000ML POUR BTL (IV SOLUTION) ×4 IMPLANT
PACK PERIPHERAL VASCULAR (CUSTOM PROCEDURE TRAY) ×2 IMPLANT
PAD ARMBOARD 7.5X6 YLW CONV (MISCELLANEOUS) ×4 IMPLANT
PATCH VASC XENOSURE 1CMX6CM (Vascular Products) ×1 IMPLANT
PATCH VASC XENOSURE 1X6 (Vascular Products) ×1 IMPLANT
SET MICROPUNCTURE 5F STIFF (MISCELLANEOUS) ×2 IMPLANT
SHEATH AVANTI 11CM 5FR (SHEATH) ×2 IMPLANT
SLEEVE SURGEON STRL (DRAPES) ×2 IMPLANT
SPONGE TONSIL 1 RF SGL (DISPOSABLE) ×2 IMPLANT
STAPLER VISISTAT 35W (STAPLE) IMPLANT
STOPCOCK 4 WAY LG BORE MALE ST (IV SETS) ×2 IMPLANT
SUT ETHILON 3 0 PS 1 (SUTURE) ×2 IMPLANT
SUT GORETEX 5 0 TT13 24 (SUTURE) IMPLANT
SUT GORETEX 6.0 TT13 (SUTURE) IMPLANT
SUT MNCRL AB 4-0 PS2 18 (SUTURE) ×2 IMPLANT
SUT PROLENE 5 0 C 1 24 (SUTURE) ×2 IMPLANT
SUT PROLENE 6 0 BV (SUTURE) ×10 IMPLANT
SUT PROLENE 7 0 BV 1 (SUTURE) ×2 IMPLANT
SUT PROLENE 7 0 BV1 MDA (SUTURE) ×2 IMPLANT
SUT SILK 2 0 PERMA HAND 18 BK (SUTURE) IMPLANT
SUT SILK 3 0 (SUTURE)
SUT SILK 3-0 18XBRD TIE 12 (SUTURE) IMPLANT
SUT VIC AB 2-0 CT1 27 (SUTURE) ×2
SUT VIC AB 2-0 CT1 TAPERPNT 27 (SUTURE) ×2 IMPLANT
SUT VIC AB 3-0 SH 27 (SUTURE) ×1
SUT VIC AB 3-0 SH 27X BRD (SUTURE) ×1 IMPLANT
SYR 10ML LL (SYRINGE) ×2 IMPLANT
SYR 3ML LL SCALE MARK (SYRINGE) ×2 IMPLANT
TAPE CLOTH SURG 4X10 WHT LF (GAUZE/BANDAGES/DRESSINGS) ×2 IMPLANT
TOWEL GREEN STERILE (TOWEL DISPOSABLE) ×2 IMPLANT
TRAY FOLEY MTR SLVR 16FR STAT (SET/KITS/TRAYS/PACK) ×2 IMPLANT
TUBING EXTENTION W/L.L. (IV SETS) ×2 IMPLANT
UNDERPAD 30X30 (UNDERPADS AND DIAPERS) ×2 IMPLANT
WATER STERILE IRR 1000ML POUR (IV SOLUTION) ×2 IMPLANT
WIRE BENTSON .035X145CM (WIRE) ×2 IMPLANT

## 2018-02-25 NOTE — ED Notes (Signed)
repaged vascular 

## 2018-02-25 NOTE — ED Notes (Signed)
Patient transported to CT 

## 2018-02-25 NOTE — Anesthesia Postprocedure Evaluation (Signed)
Anesthesia Post Note  Patient: Designer, fashion/clothing  Procedure(s) Performed: LEFT ILIAC AND POPLITEAL ARTERY THROMBECTOMY (Left ) INTRA OPERATIVE ARTERIOGRAM WITH LEFT LEG RUNOFF (Left ) BALLOON ANGIOPLASTY LEFT ILIAC ARTERY (Left ) PATCH ANGIOPLASTY LEFT SUPERFICIAL FEMORAL ARTERY WITH BOVINE PATCH (Left )     Patient location during evaluation: PACU Anesthesia Type: General Level of consciousness: awake and alert Pain management: pain level controlled Vital Signs Assessment: post-procedure vital signs reviewed and stable Respiratory status: spontaneous breathing, nonlabored ventilation, respiratory function stable and patient connected to nasal cannula oxygen Cardiovascular status: blood pressure returned to baseline and stable Postop Assessment: no apparent nausea or vomiting Anesthetic complications: no    Last Vitals:  Vitals:   02/25/18 1315 02/25/18 1330  BP: (!) 145/91 123/80  Pulse: (!) 109 (!) 110  Resp: 17 18  Temp: 36.5 C   SpO2: 97% 96%    Last Pain:  Vitals:   02/25/18 1330  TempSrc:   PainSc: 0-No pain                 Zakyia Gagan S

## 2018-02-25 NOTE — Progress Notes (Signed)
ANTICOAGULATION CONSULT NOTE - Initial Consult  Pharmacy Consult for Heparin Indication: LLE ischemia/thrombosis  Allergies  Allergen Reactions  . Ciprofloxacin Hcl Hives  . Macrobid [Nitrofurantoin Macrocrystal] Hives, Shortness Of Breath and Rash  . Other Hives, Shortness Of Breath and Rash    NO "-CILLINS"!!!  . Penicillins Shortness Of Breath    Had had cephalosporins without incident  . Sulfa Antibiotics Hives, Shortness Of Breath and Rash  . Lexapro [Escitalopram Oxalate]     I just did not like it.    Patient Measurements: Height: _0  (170.2 cm) Weight: 252 lb (114.3 kg) IBW/kg (Calculated) : 61.6 Heparin Dosing Weight: 90 kg  Vital Signs: Temp: 98.1 F (36.7 C) (05/25 0010) Temp Source: Oral (05/25 0010) BP: 166/95 (05/25 0400) Pulse Rate: 101 (05/25 0252)  Labs: Recent Labs    02/25/18 0130 02/25/18 0139  HGB 12.1 14.3  HCT 40.0 42.0  PLT 491*  --   APTT 28  --   LABPROT 12.9  --   INR 0.98  --   CREATININE 1.63* 1.50*    Estimated Creatinine Clearance: 63.1 mL/min (A) (by C-G formula based on SCr of 1.5 mg/dL (H)).   Medical History: Past Medical History:  Diagnosis Date  . Anxiety   . Diabetes ([place])   . GERD (gastroesophageal reflux disease)   . History of blood clots   . Hypercalcemia   . Hyperparathyroidism   . Nephrolithiasis   . Renal calculi     Medications:  No current facility-administered medications on file prior to encounter.    Current Outpatient Medications on File Prior to Encounter  Medication Sig Dispense Refill  . acetaminophen (TYLENOL) 500 MG tablet Take 1,000 mg by mouth every 6 (six) hours as needed for moderate pain.    . naproxen sodium (ALEVE) 220 MG tablet Take 220 mg by mouth daily as needed (pain).    . blood glucose meter kit and supplies Dispense based on patient and insurance preference. Use up to four times daily as directed. (FOR ICD-9 250.00, 250.01). 1 each 0  . Blood Glucose Monitoring Suppl (TRUE  METRIX METER) w/Device KIT Use as directed 1 kit 0  . buPROPion (BUPROBAN) 150 MG 12 hr tablet Take 1 tablet (150 mg total) by mouth 2 (two) times daily. (Patient not taking: Reported on 02/25/2018) 60 tablet 2  . clopidogrel (PLAVIX) 75 MG tablet Take 1 tablet (75 mg total) by mouth daily. (Patient not taking: Reported on 02/25/2018) 30 tablet 10  . ferrous sulfate 325 (65 FE) MG tablet Take 1 tablet (325 mg total) by mouth daily with breakfast. (Patient not taking: Reported on 02/25/2018) 100 tablet 1  . gabapentin (NEURONTIN) 300 MG capsule Take 1 capsule (300 mg total) by mouth 2 (two) times daily. (Patient not taking: Reported on 02/25/2018) 60 capsule 3  . glucose blood (TRUE METRIX BLOOD GLUCOSE TEST) test strip Use as instructed 100 each 12  . glucose blood test strip Use as instructed 100 each 0  . hydrOXYzine (ATARAX/VISTARIL) 25 MG tablet Take 2 tablets (50 mg total) by mouth at bedtime. TAKE 1 TABLET BY MOUTH AT BEDTIME AS NEEDED FOR ANXIETY. (Patient not taking: Reported on 02/25/2018) 60 tablet 2  . insulin aspart (NOVOLOG) 100 UNIT/ML injection 5-15 units subcut TID with meals (Patient not taking: Reported on 02/25/2018) 10 mL 11  . insulin glargine (LANTUS) 100 UNIT/ML injection Inject 0.7 mLs (70 Units total) into the skin daily. (Patient not taking: Reported on 02/25/2018) 10 mL 11  .  Insulin Pen Needle 31G X 5 MM MISC Use with Lantus and Novolog injections. 100 each 11  . Insulin Syringe-Needle U-100 (BD INSULIN SYRINGE ULTRAFINE) 31G X 5/16" 0.5 ML MISC Use as directed 100 each 3  . lisinopril (PRINIVIL,ZESTRIL) 5 MG tablet Take 0.5 tablets (2.5 mg total) by mouth daily. (Patient not taking: Reported on 02/25/2018) 45 tablet 3  . nicotine (NICODERM CQ - DOSED IN MG/24 HOURS) 14 mg/24hr patch Place 1 patch (14 mg total) onto the skin daily. (Patient not taking: Reported on 02/25/2018) 28 patch 1  . omeprazole (PRILOSEC) 20 MG capsule Take 2 capsules (40 mg total) by mouth daily. (Patient not  taking: Reported on 02/25/2018) 60 capsule 3  . sitaGLIPtin (JANUVIA) 50 MG tablet Take 1 tablet (50 mg total) by mouth daily. (Patient not taking: Reported on 02/25/2018) 30 tablet 3  . traZODone (DESYREL) 50 MG tablet Take 0.5 tablets (25 mg total) by mouth at bedtime as needed for sleep. (Patient not taking: Reported on 02/25/2018) 30 tablet 3  . TRUEPLUS LANCETS 28G MISC Use as directed 100 each 6  . [DISCONTINUED] aspirin EC 81 MG tablet Take 81 mg by mouth daily.    . [DISCONTINUED] ibuprofen (ADVIL,MOTRIN) 200 MG tablet Take 400 mg by mouth every 6 (six) hours as needed (for pain or headaches).     . [DISCONTINUED] Multiple Vitamins-Calcium (ONE-A-DAY WOMENS FORMULA) TABS Take 1 tablet by mouth daily.       Assessment: 42 y.o. female with L iliac and popliteal thrombus for heparin  Goal of Therapy:  Heparin level 0.3-0.7 units/ml Monitor platelets by anticoagulation protocol: Yes   Plan:  Heparin 5000 units IV bolus, then start heparin 2000 units/hr Check heparin level in 6 hours.   Shakiera Edelson, Bronson Curb 02/25/2018,5:35 AM

## 2018-02-25 NOTE — ED Notes (Signed)
Nurse starting IV and will draw labs. 

## 2018-02-25 NOTE — ED Provider Notes (Signed)
[facility] EMERGENCY DEPARTMENT Provider Note   CSN: [id number] Arrival date & time: 02/24/18  2330     History   Chief Complaint Chief Complaint  Patient presents with  . Leg Pain    HPI Terri Sparks is a 42 y.o. female.  The history is provided by the patient and medical records.  Leg Pain       42 y.o. 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 Donzetta Matters,  hypercalcemia, hyperparathyroidism, presenting to the ED for left leg pain.  Patient states this began around 12 noon today and has been steadily worsening.  States now she is starting to get some numbness/tingling in her foot.  States she is hardly able to walk because the pain is so bad.  She denies any new falls/trauma.  States she is supposed to be taking plavix, however cannot afford it so has not taken it in several weeks.  She did have angiogram in February 2019-- all vessels were patent at that time.  Past Medical History:  Diagnosis Date  . Anxiety   . Diabetes ([place])   . GERD (gastroesophageal reflux disease)   . History of blood clots   . Hypercalcemia   . Hyperparathyroidism   . Nephrolithiasis   . Renal calculi     Patient Active Problem List   Diagnosis Date Noted  . Microalbuminuria 09/29/2017  . Insomnia 09/29/2017  . Immunization due 08/01/2017  . Anxiety and depression 04/28/2017  . 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 Iliac artery occlusion, left ([place]) 02/25/2017  . Tobacco abuse 02/25/2017  . Diabetes mellitus, type II ([place]) 02/25/2017  . Peripheral vascular disease of lower extremity ([place]) 02/24/2017  . GERD (gastroesophageal reflux disease)   . Hyperparathyroidism     Past Surgical History:  Procedure Laterality Date  . ABDOMINAL AORTOGRAM W/LOWER EXTREMITY N/A 11/14/2017   Procedure: ABDOMINAL AORTOGRAM W/LOWER EXTREMITY;  Surgeon: Waynetta Sandy, MD;  Location: [facility];  Service: Cardiovascular;  Laterality: N/A;  . EMBOLECTOMY Left 02/24/2017   Procedure: Left Lower Extremity Embolectomy and Angiogram.;  Surgeon: Waynetta Sandy, MD;  Location: [facility];  Service: Vascular;  Laterality: Left;  . INSERTION OF ILIAC STENT  02/24/2017   Procedure: INSERTION OF Common ILIAC STENT;  Surgeon: Waynetta Sandy, MD;  Location: [facility];  Service: Vascular;;  . LOWER EXTREMITY ANGIOGRAM Left 02/24/2017   Procedure: Aortagram, Left lower extremity Run-off;  Surgeon: Waynetta Sandy, MD;  Location: [facility];  Service: Vascular;  Laterality: Left;  . 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 TUBAL LIGATION       OB History   None      Home Medications    Prior to Admission medications   Medication Sig Start Date End Date Taking? Authorizing Provider  aspirin EC 81 MG tablet Take 81 mg by mouth daily.    [provider]  blood glucose meter kit and supplies Dispense based on patient and insurance preference. Use up to four times daily as directed. (FOR ICD-9 250.00, 250.01). 03/03/17   Cristal Ford, DO  Blood Glucose Monitoring Suppl (TRUE METRIX METER) w/Device KIT Use as directed 08/01/17   Ladell Pier, MD  buPROPion (BUPROBAN) 150 MG 12 hr tablet Take 1 tablet (150 mg total) by mouth 2 (two) times daily. 01/27/18   Ladell Pier, MD  clopidogrel (PLAVIX)  75 MG tablet Take 1 tablet (75 mg total) by mouth daily. 09/20/17   Waynetta Sandy, MD  ferrous sulfate 325 (65 FE) MG tablet Take 1 tablet (325 mg total) by mouth daily with breakfast. 01/31/18   Ladell Pier, MD  gabapentin (NEURONTIN) 300 MG capsule Take 1 capsule (300 mg total) by mouth 2 (two) times daily. 08/01/17   Ladell Pier, MD  glucose blood (TRUE METRIX BLOOD GLUCOSE TEST) test strip Use as instructed 08/01/17   Ladell Pier, MD  glucose blood test strip Use as instructed 10/10/16   Muthersbaugh, Jarrett Soho, PA-C    hydrOXYzine (ATARAX/VISTARIL) 25 MG tablet Take 2 tablets (50 mg total) by mouth at bedtime. TAKE 1 TABLET BY MOUTH AT BEDTIME AS NEEDED FOR ANXIETY. 01/27/18   Ladell Pier, MD  ibuprofen (ADVIL,MOTRIN) 200 MG tablet Take 400 mg by mouth every 6 (six) hours as needed (for pain or headaches).     [provider]  insulin aspart (NOVOLOG) 100 UNIT/ML injection 5-15 units subcut TID with meals 04/28/17   Ladell Pier, MD  insulin glargine (LANTUS) 100 UNIT/ML injection Inject 0.7 mLs (70 Units total) into the skin daily. 01/27/18   Ladell Pier, MD  Insulin Pen Needle 31G X 5 MM MISC Use with Lantus and Novolog injections. 04/28/17   Ladell Pier, MD  Insulin Syringe-Needle U-100 (BD INSULIN SYRINGE ULTRAFINE) 31G X 5/16" 0.5 ML MISC Use as directed 04/28/17   Ladell Pier, MD  lisinopril (PRINIVIL,ZESTRIL) 5 MG tablet Take 0.5 tablets (2.5 mg total) by mouth daily. 09/29/17   Ladell Pier, MD  Multiple Vitamins-Calcium (ONE-A-DAY WOMENS FORMULA) TABS Take 1 tablet by mouth daily.    [provider]  nicotine (NICODERM CQ - DOSED IN MG/24 HOURS) 14 mg/24hr patch Place 1 patch (14 mg total) onto the skin daily. 04/28/17   Ladell Pier, MD  omeprazole (PRILOSEC) 20 MG capsule Take 2 capsules (40 mg total) by mouth daily. 09/29/17   Ladell Pier, MD  sitaGLIPtin (JANUVIA) 50 MG tablet Take 1 tablet (50 mg total) by mouth daily. 01/27/18   Ladell Pier, MD  traZODone (DESYREL) 50 MG tablet Take 0.5 tablets (25 mg total) by mouth at bedtime as needed for sleep. 09/29/17   Ladell Pier, MD  TRUEPLUS LANCETS 28G MISC Use as directed 08/01/17   Ladell Pier, MD    Family History Family History  Problem Relation Age of Onset  . Depression Mother   . Diabetes Father   . Heart failure Father     Social History Social History   Tobacco Use  . Smoking status: Current Some Day Smoker    Packs/day: 0.25    Years: 15.00     Pack years: 3.75    Types: Cigarettes    Last attempt to quit: 03/04/2017    Years since quitting: 0.9  . Smokeless tobacco: Never Used  Substance Use Topics  . Alcohol use: No  . Drug use: Yes    Types: Marijuana    Comment: occasionally     Allergies   Ciprofloxacin hcl; Macrobid [nitrofurantoin macrocrystal]; Other; Penicillins; Sulfa antibiotics; and Lexapro [escitalopram oxalate]   Review of Systems Review of Systems  Musculoskeletal: Positive for arthralgias.  All other systems reviewed and are negative.    Physical Exam Updated Vital Signs BP (!) 146/77 (BP Location: Right Arm)   Pulse (!) 104   Temp 98.1 F (36.7 C) (Oral)  Resp 18   Ht 5' 7"  (1.702 m)   Wt 114.3 kg (252 lb)   SpO2 100%   BMI 39.47 kg/m   Physical Exam  Constitutional: She is oriented to person, place, and time. She appears well-developed and well-nourished.  HENT:  Head: Normocephalic and atraumatic.  Mouth/Throat: Oropharynx is clear and moist.  Eyes: Pupils are equal, round, and reactive to light. Conjunctivae and EOM are normal.  Neck: Normal range of motion.  Cardiovascular: Normal rate, regular rhythm and normal heart sounds.  Pulmonary/Chest: Effort normal and breath sounds normal.  Abdominal: Soft. Bowel sounds are normal.  Musculoskeletal: Normal range of motion.  Left leg with tenderness of left mid-distal calf; there is no overlying erythema or warmth to touch; no palpable cord; lower leg from knee to foot is cool to touch and white in color; cap refill is delayed; normal sensation distally  Neurological: She is alert and oriented to person, place, and time.  Skin: Skin is warm and dry.  Psychiatric: She has a normal mood and affect.  Nursing note and vitals reviewed.    ED Treatments / Results  Labs (all labs ordered are listed, but only abnormal results are displayed) Labs Reviewed - No data to display  EKG None  Radiology Ct Angio Ao+bifem W & Or Wo  Contrast  Result Date: 02/25/2018 CLINICAL DATA:  Left leg cold and painful. Sudden onset yesterday about 2 p.m. Patient had CTA runoff on 02/24/2017 demonstrating thrombus in the left common iliac artery. Patient subsequently underwent left embolectomy procedure with lower extremity angiogram and insertion of iliac stent. EXAM: CT ANGIOGRAPHY OF ABDOMINAL AORTA WITH ILIOFEMORAL RUNOFF TECHNIQUE: Multidetector CT imaging of the abdomen, pelvis and lower extremities was performed using the standard protocol during bolus administration of intravenous contrast. Multiplanar CT image reconstructions and MIPs were obtained to evaluate the vascular anatomy. CONTRAST:  39m ISOVUE-370 IOPAMIDOL (ISOVUE-370) INJECTION 76%. Reduced dose due to decreased GFR 38. COMPARISON:  02/24/2017 FINDINGS: VASCULAR Aorta: Normal caliber abdominal aorta. Aorta is patent. No dissection identified. No significant stenosis. Celiac: Celiac axis is patent without aneurysm or focal stenosis. SMA: Superior mesenteric artery is patent without aneurysm or focal stenosis. Renals: Single bilateral renal arteries appear patent with calcification present. No occlusion. Nephrograms are symmetrical. IMA: Inferior mesenteric artery is patent. RIGHT Lower Extremity Inflow: Right common iliac, internal iliac, and external iliac arteries are patent without evidence of significant stenosis. Outflow: Common, superficial and profunda femoral arteries and the popliteal artery are patent without evidence of aneurysm, dissection, vasculitis or significant stenosis. Runoff: Patent 3 vessel runoff to the right ankle. LEFT Lower Extremity Inflow: There is a metallic wall stent in the left common iliac artery, new since previous study. No flow is demonstrated in the left common iliac artery, in the stent, or in the left internal and external iliac arteries. There is reconstitution of flow at the left common femoral artery likely via epigastric artery collaterals  Outflow: Common, superficial and profunda femoral arteries are patent without evidence of aneurysm, dissection, vasculitis or significant stenosis. Left popliteal artery demonstrates no flow likely due to thrombosis. Runoff: The tibial trunk is thrombosed. There is reconstitution of flow to the proximal tibial vessels with intermittent flow demonstrated within portions of the anterior and posterior tibial, and peroneal arteries. Diminished flow to the ankle is predominantly via the anterior tibial artery. Veins: No obvious venous abnormality within the limitations of this arterial phase study. Review of the MIP images confirms the above findings. NON-VASCULAR Lower  chest: Mild dependent atelectasis. Hepatobiliary: No focal liver abnormality is seen. No gallstones, gallbladder wall thickening, or biliary dilatation. Pancreas: Unremarkable. No pancreatic ductal dilatation or surrounding inflammatory changes. Spleen: Normal in size without focal abnormality. Adrenals/Urinary Tract: No adrenal gland nodules. Bilateral renal parenchymal scarring with overall atrophy of the left kidney in comparison to the right. Nephrograms are symmetrical. Multiple stones are demonstrated in both kidneys. Largest stone in the right lower pole measures 13 mm maximal dimension and largest stone in the left kidney measures 13 mm in maximal dimension. No hydronephrosis or hydroureter. No ureteral stones are demonstrated. There is gas in the intrarenal collecting systems bilaterally. There is also gas in the bladder. This could arise from instrumentation or may indicate infection with gas-forming organism. No bladder wall thickening. Stomach/Bowel: Stomach is within normal limits. Appendix appears normal. No evidence of bowel wall thickening, distention, or inflammatory changes. Lymphatic: No significant lymphadenopathy. Reproductive: Uterus and bilateral adnexa are unremarkable. Other: No free air or free fluid in the abdomen. Abdominal  wall musculature appears intact. Musculoskeletal: No destructive bone lesions.  Left popliteal cyst. IMPRESSION: VASCULAR 1. Interval placement of a left common iliac artery stent. The stent as well as the remainder the common iliac artery, external iliac artery, and internal iliac artery is thrombosed with no flow demonstrated. Reconstitution of flow to the left common femoral artery likely via epigastric collaterals. 2. No flow demonstrated in the left popliteal artery and tibial trunk consistent with thrombosis. There is diminished and intermittent flow in the runoff vessels with only limited single vessel at best flow to the left ankle. NON-VASCULAR 1. Gas in the intrarenal collecting system of both kidneys as well as in the bladder. This could be due to instrumentation or may result from infection due to gas-forming organisms. Similar findings were present on the previous study from yesterday. 2. Multiple bilateral large nonobstructing intrarenal stones. 3. Left popliteal cyst. These results were called by telephone at the time of interpretation on 02/25/2018 at 5:12 am to PA. Quincy Carnes , who verbally acknowledged these results. Electronically Signed   By: Lucienne Capers M.D.   On: 02/25/2018 05:20    Procedures Procedures (including critical care time)  CRITICAL CARE Performed by: Larene Pickett   Total critical care time: 45 minutes  Critical care time was exclusive of separately billable procedures and treating other patients.  Critical care was necessary to treat or prevent imminent or life-threatening deterioration.  Critical care was time spent personally by me on the following activities: development of treatment plan with patient and/or surrogate as well as nursing, discussions with consultants, evaluation of patient's response to treatment, examination of patient, obtaining history from patient or surrogate, ordering and performing treatments and interventions, ordering and review  of laboratory studies, ordering and review of radiographic studies, pulse oximetry and re-evaluation of patient's condition.   Medications Ordered in ED Medications - No data to display   Initial Impression / Assessment and Plan / ED Course  I have reviewed the triage vital signs and the nursing notes.  Pertinent labs & imaging results that were available during my care of the patient were reviewed by me and considered in my medical decision making (see chart for details).  42 year old female here with left lower leg pain.  Began 12 noon, has been worsening throughout the evening.  States she is now starting to experience some numbness and paresthesias of the left leg and difficulty walking due to pain.  On exam her lower  leg is pale in color and cold to the touch.  Her cap refill is very delayed.  I am unable to palpate or find pulse with doppler.  At this hour, unable to get vascular US.  Will obtain labs and CTA of the leg.  Discussed with Dr. Bridgett Larsson-- likely has repeat arterial occlusion.  Agrees with CTA for now.  If clotted, will need admission and likely repeat embolectomy.  5:20 AM Spoke with Dr. Gerilyn Nestle with radiology-- left common iliac stent has occluded as well as remainder of common iliac, external iliac, internal iliac-- essentially minimal flow distally.  Left popliteal and tibial trunk also occluded now.  Will start heparin.  Will re-page vascular surgery.  Dr. Bridgett Larsson has reviewed CT, coming in to admit patient for further management.  Final Clinical Impressions(s) / ED Diagnoses   Final diagnoses:  Arterial occlusion    ED Discharge Orders    None       Larene Pickett, PA-C 02/25/18 0615    Palumbo, April, MD 02/25/18 2307

## 2018-02-25 NOTE — Op Note (Addendum)
OPERATIVE NOTE   PROCEDURE: 1. Redo left femoral exposure 2. Left iliac artery thromboembolectomy 3. Left popliteal artery and anterior tibial artery thromboembolectomy 4. Limited left superficial femoral artery endarterectomy and bovine patch angioplasty 5. Left leg runoff 6. Angioplasty Left common iliac artery (8 mm x 40 mm)  PRE-OPERATIVE DIAGNOSIS: recurrent acute thromboembolism left leg  POST-OPERATIVE DIAGNOSIS: same as above   SURGEON: Adele Barthel, MD  ASSISTANT(S): RNFA  ANESTHESIA: general  ESTIMATED BLOOD LOSS: 300 cc  CONTRAST: 105 cc  FINDING(S): 1.  Extensive acute thrombus in common iliac artery stent and presumed below-the-knee popliteal artery artery 2.  Widely patent aortoiliac segment with suggestion of residual thrombus in distal stent: resolved with angioplasty 3.  Patent below-the-knee popliteal artery with single vessel runoff via anterior tibial artery which has a proximal stenosis 4.  Dopplerable anterior tibial artery at end of case  SPECIMEN(S):  Thrombus from popliteal artery and iliac arteries  INDICATIONS:   Terri Sparks is a 42 y.o. 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  She has previously required thrombectomy of her left leg and stenting of left common iliac artery.  This is of unknown etiology.  Yesterday, she had recurrent symptoms and CTA suggestive of left common iliac artery thrombosis at the level of the stent and distal popliteal artery thrombosis.  I offered the patient left leg thromboembolectomy, possible femorofemoral bypass and left leg angiography.  I emphasized to this patient that is is a high risk for limb loss in this setting of recurrent rethromboembolism and diseased tibial arteries.  The risk, benefits, and alternative for proposed operations were discussed with the patient.  The patient is aware the risks include but are not limited to: bleeding, infection, myocardial infarction,  stroke, limb loss, nerve damage, limb edema, need for additional procedures in the future, wound complications, and inability to complete the bypass. I discussed with the patient the nature of angiographic procedures, especially the limited patencies of any endovascular intervention.  The patient is aware of that the risks of an angiographic procedure include but are not limited to: bleeding, infection, access site complications, renal failure, embolization, rupture of vessel, dissection, arteriovenous fistula, possible need for emergent surgical intervention, possible need for surgical procedures to treat the patient's pathology, anaphylactic reaction to contrast, and stroke and death.  The patient is aware she is at risk for contrast induced nephropathy given substantial contrast load already given by CTA.  The delay in care due to delayed presentation to ED and multidrug use increase the risk of major adverse outcome.The patient is aware of the risks and agrees to proceed.   DESCRIPTION: After obtaining full informed written consent, the patient was brought back to the operating room and placed supine upon the operating table.  The patient received IV antibiotics prior to induction.  A procedure time out was completed and the correct surgical site was verified.  After obtaining adequate anesthesia, the patient was prepped and draped in the standard fashion for: a femorofemoral bypass.  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  There did not appear to be any debris or infection within the sinus.  I made an incision through the prior incision line.  I dissected through the extensive scar tissue until I found the superficial femoral artery.  This was extremely difficult due to the density of the scar tissue.  I carried  the dissection periadventitial until I found was the prior arteriotomy the common femoral  artery.  I dissected out the distal common femoral artery and profunda femoral artery.  This was extremely difficult due to patient's habitus and dense scar tissue.  In this process of dissection, the prior repaired arteriotomy in the common femoral artery sheared at the presence of a proximal superficial femoral artery eccentric calcified plaque.  I had to hold pressure on the bleeding while dissecting out the distal common femoral artery.  Eventually, I was able to clamped the distal common femoral artery.  The patient was given 13000 units of Heparin intravenously, which was a therapeutic bolus. An additional 2000 units of Heparin was administered every hour after initial bolus to maintain anticoagulation.  In total, 15000 units of Heparin was administrated to achieve and maintain a therapeutic level of anticoagulation.  I placed vessel loops around the distal common femoral artery, profunda femoral artery and superficial femoral artery.  Both the profunda femoral artery and superficial femoral artery were placed under tension.   I made an arteriotomy in the superficial femoral artery distal to the calcific plaque.  This was extended proximally with a Potts scissor to the level of the femoral bifurcation.  Essentially the superficial femoral artery had torn off the prior arteriotomy repair anteriorly due to the proximal superficial femoral artery calcific plaque.  I did a limited endarterectomy to removed the calcific plaque.  I tacked down the wall with interrupted 7-0 Prolene stitches.    As I had the superficial femoral artery exposed, I passed the 3 and 4 Fogarty distally all the way down to the foot, 70 cm.  I extracted an extensive acute thrombus from the presumed below-the-knee popliteal artery based on the size of the thrombus.  After a couple of passes without any thrombus.  I flushed the superficial femoral artery distally with heparinized saline.    I then passed the 3 Fogarty proximally.   Initially, I only got a limited amount of thrombus but on the second pass,  I extracted essentially the majority of the thrombus in the common iliac artery stent, blowing out the plug of thrombus.  I got two negative passes with the 4 Fogarty prior to reclamping the distal common femoral artery.  I then trimmed the damaged superficial femoral artery arterial wall then fashion a bovine pericardial patch for this arteriotomy. I sewed the bovine patch to the proximal superficial femoral artery with two running stitches of 6-0 Prolene.  Prior to completion, both ends of the superficial femoral artery were bled as was the profunda femoral artery and distal common femoral artery.  There was no residual clot noted.  I finished this bovine pericardial patch in the usual fashion.    At this point, I cannulated the bovine patched superficial femoral artery with a 18 gauge needle.  I passed a J-wire into the distal external iliac artery and then placed a 5-Fr sheath.  I loaded a Bentson wire and 4-Fr straight catheter.  Through this I did a distal aortogram which demonstrated patency of the iliac arteries.  There appeared to be some residual thrombus on the wall of the distal stent.  I elected to complete the rest of the runoff via retrograde injection via the left femoral sheath.  The imaging was adequate down to the level of the proximal tibial arteries: patent femoropopliteal segments and patent left anterior tibial artery with known stenosis.  Due to technical issues, there was difficulty getting distal tibial  imaging.  I could doppler a strong anterior tibial artery so I felt this was adequate.  I placed the superficial femoral artery and profunda femoral artery under tension.  The Bentson wire was replaced into the aorta and then the left sheath exchanged for 6-Fr sheath.  I placed a 8 mm x 40 mm balloon on the left wire and then inflated the balloon along the stent at 10 atm for 1 minute.  No frank waists were  noted.  I removed the balloon and wire.  I then removed the sheath while clamped the distal common femoral artery.  I extended the hole in the bovine patch with a Potts scissor.  I opened the common femoral artery briefly and some thrombus was noted at the hole in the patch, likely from the angioplasty.  I reclamped the distal common femoral artery and washed out the thrombus.  I flushed the distal common femoral artery another two times, getting no further thrombus.  I repaired this defect in the bovine patch with a running stitch of 6-0 Prolene.    All vessel loops were removed and then 50 mg of Protamine was given to fully reverse anticoagulation.  I packed the right groin with Avitene.  Given this was a recurrence of thromboembolism, I will plan on aggressive anticoagulation.  I elected to placed a Blake drain into the left groin.  I routed a 15 Blake drain through the lateral subcutaneous tissue and the secured the drain to the skin with 3-0 silk stitch tied to the drain.  I shortened the drain and laid it adjacent to the superficial femoral artery and common femoral artery.  I repaired the left groin with a double layer of 2-0 Vicryl followed by a single layer of 3-0 Vicryl in the sub-dermal layer.  The skin was reapproximated with staples.  The skin was cleaned, dried and dressed with sterile dressings.  At the end of the case, there remained a left anterior tibial artery signal.   COMPLICATIONS: none  CONDITION: stable   Adele Barthel, MD, [facility] [place] Office: [phone] Pager: [phone]  02/25/2018, 12:27 PM

## 2018-02-25 NOTE — Anesthesia Preprocedure Evaluation (Addendum)
Anesthesia Evaluation  Patient identified by MRN, date of birth, ID band Patient awake    Reviewed: Allergy & Precautions, NPO status , Patient's Chart, lab work & pertinent test results  Airway Mallampati: III  TM Distance: <3 FB Neck ROM: Full    Dental  (+) Dental Advisory Given, Loose, Poor Dentition, Missing,    Pulmonary neg pulmonary ROS, Current Smoker,    Pulmonary exam normal breath sounds clear to auscultation       Cardiovascular + Peripheral Vascular Disease  negative cardio ROS Normal cardiovascular exam Rhythm:Regular Rate:Normal     Neuro/Psych negative neurological ROS  negative psych ROS   GI/Hepatic Neg liver ROS,   Endo/Other  diabetes, Poorly ControlledMorbid obesity  Renal/GU negative Renal ROS  negative genitourinary   Musculoskeletal negative musculoskeletal ROS (+)   Abdominal   Peds negative pediatric ROS (+)  Hematology negative hematology ROS (+)   Anesthesia Other Findings   Reproductive/Obstetrics negative OB ROS                            Anesthesia Physical Anesthesia Plan  ASA: IV and emergent  Anesthesia Plan: General   Post-op Pain Management:    Induction: Intravenous  PONV Risk Score and Plan: 2 and Ondansetron and Treatment may vary due to age or medical condition  Airway Management Planned: Oral ETT  Additional Equipment:   Intra-op Plan:   Post-operative Plan: Possible Post-op intubation/ventilation  Informed Consent: I have reviewed the patients History and Physical, chart, labs and discussed the procedure including the risks, benefits and alternatives for the proposed anesthesia with the patient or authorized representative who has indicated his/her understanding and acceptance.   Dental advisory given  Plan Discussed with: CRNA and Surgeon  Anesthesia Plan Comments:         Anesthesia Quick Evaluation

## 2018-02-25 NOTE — ED Notes (Signed)
Pt called out requesting something for pain. RN informed

## 2018-02-25 NOTE — ED Notes (Signed)
Pt calling out, asking for something for her pain.

## 2018-02-25 NOTE — Progress Notes (Signed)
[facility] for Heparin Indication: LLE ischemia/thrombosis  Allergies  Allergen Reactions  . Ciprofloxacin Hcl Hives  . Macrobid [Nitrofurantoin Macrocrystal] Hives, Shortness Of Breath and Rash  . Other Hives, Shortness Of Breath and Rash    NO "-CILLINS"!!!  . Penicillins Shortness Of Breath    Had had cephalosporins without incident  . Sulfa Antibiotics Hives, Shortness Of Breath and Rash  . Lexapro [Escitalopram Oxalate]     I just did not like it.    Patient Measurements: Height: '5\' 7"'$  (170.2 cm) Weight: 252 lb (114.3 kg) IBW/kg (Calculated) : 61.6 Heparin Dosing Weight: 90 kg  Vital Signs: Temp: 97.5 F (36.4 C) (05/25 1345) Temp Source: Oral (05/25 0543) BP: 134/90 (05/25 1427) Pulse Rate: 108 (05/25 1345)  Labs: Recent Labs    02/25/18 0130 02/25/18 0139 02/25/18 1254  HGB 12.1 14.3  --   HCT 40.0 42.0  --   PLT 491*  --   --   APTT 28  --   --   LABPROT 12.9  --   --   INR 0.98  --   --   HEPARINUNFRC  --   --  0.72*  CREATININE 1.63* 1.50*  --    Estimated Creatinine Clearance: 63.1 mL/min (A) (by C-G formula based on SCr of 1.5 mg/dL (H)).  Medical History: Past Medical History:  Diagnosis Date  . Anxiety   . Diabetes ([place])   . GERD (gastroesophageal reflux disease)   . History of blood clots   . Hypercalcemia   . Hyperparathyroidism   . Nephrolithiasis   . Renal calculi    Medications:  No current facility-administered medications on file prior to encounter.    Current Outpatient Medications on File Prior to Encounter  Medication Sig Dispense Refill  . acetaminophen (TYLENOL) 500 MG tablet Take 1,000 mg by mouth every 6 (six) hours as needed for moderate pain.    . naproxen sodium (ALEVE) 220 MG tablet Take 220 mg by mouth daily as needed (pain).    . blood glucose meter kit and supplies Dispense based on patient and insurance preference. Use up to four times daily as directed. (FOR ICD-9 250.00,  250.01). 1 each 0  . Blood Glucose Monitoring Suppl (TRUE METRIX METER) w/Device KIT Use as directed 1 kit 0  . buPROPion (BUPROBAN) 150 MG 12 hr tablet Take 1 tablet (150 mg total) by mouth 2 (two) times daily. (Patient not taking: Reported on 02/25/2018) 60 tablet 2  . clopidogrel (PLAVIX) 75 MG tablet Take 1 tablet (75 mg total) by mouth daily. (Patient not taking: Reported on 02/25/2018) 30 tablet 10  . ferrous sulfate 325 (65 FE) MG tablet Take 1 tablet (325 mg total) by mouth daily with breakfast. (Patient not taking: Reported on 02/25/2018) 100 tablet 1  . gabapentin (NEURONTIN) 300 MG capsule Take 1 capsule (300 mg total) by mouth 2 (two) times daily. (Patient not taking: Reported on 02/25/2018) 60 capsule 3  . glucose blood (TRUE METRIX BLOOD GLUCOSE TEST) test strip Use as instructed 100 each 12  . glucose blood test strip Use as instructed 100 each 0  . hydrOXYzine (ATARAX/VISTARIL) 25 MG tablet Take 2 tablets (50 mg total) by mouth at bedtime. TAKE 1 TABLET BY MOUTH AT BEDTIME AS NEEDED FOR ANXIETY. (Patient not taking: Reported on 02/25/2018) 60 tablet 2  . insulin aspart (NOVOLOG) 100 UNIT/ML injection 5-15 units subcut TID with meals (Patient not taking: Reported on 02/25/2018) 10 mL 11  .  insulin glargine (LANTUS) 100 UNIT/ML injection Inject 0.7 mLs (70 Units total) into the skin daily. (Patient not taking: Reported on 02/25/2018) 10 mL 11  . Insulin Pen Needle 31G X 5 MM MISC Use with Lantus and Novolog injections. 100 each 11  . Insulin Syringe-Needle U-100 (BD INSULIN SYRINGE ULTRAFINE) 31G X 5/16" 0.5 ML MISC Use as directed 100 each 3  . lisinopril (PRINIVIL,ZESTRIL) 5 MG tablet Take 0.5 tablets (2.5 mg total) by mouth daily. (Patient not taking: Reported on 02/25/2018) 45 tablet 3  . nicotine (NICODERM CQ - DOSED IN MG/24 HOURS) 14 mg/24hr patch Place 1 patch (14 mg total) onto the skin daily. (Patient not taking: Reported on 02/25/2018) 28 patch 1  . omeprazole (PRILOSEC) 20 MG capsule  Take 2 capsules (40 mg total) by mouth daily. (Patient not taking: Reported on 02/25/2018) 60 capsule 3  . sitaGLIPtin (JANUVIA) 50 MG tablet Take 1 tablet (50 mg total) by mouth daily. (Patient not taking: Reported on 02/25/2018) 30 tablet 3  . traZODone (DESYREL) 50 MG tablet Take 0.5 tablets (25 mg total) by mouth at bedtime as needed for sleep. (Patient not taking: Reported on 02/25/2018) 30 tablet 3  . TRUEPLUS LANCETS 28G MISC Use as directed 100 each 6  . [DISCONTINUED] aspirin EC 81 MG tablet Take 81 mg by mouth daily.    . [DISCONTINUED] ibuprofen (ADVIL,MOTRIN) 200 MG tablet Take 400 mg by mouth every 6 (six) hours as needed (for pain or headaches).     . [DISCONTINUED] Multiple Vitamins-Calcium (ONE-A-DAY WOMENS FORMULA) TABS Take 1 tablet by mouth daily.      Assessment:42 y.o. female with L iliac and popliteal thrombus now s/p OR 5/25 for thromboembolectomy. Heparin was infusing when patient returned from the OR. RN reports no overt bleeding from surgical site.   Heparin level above goal prior to going to OR, will reduce the rate slightly.   Goal of Therapy:  Heparin level 0.3-0.7 units/ml Monitor platelets by anticoagulation protocol: Yes   Plan:  Reduce heparin gtt to 1850 units/hr Heparin level in 8 hours Daily heparin level and CBC Monitor for s/sx of bleeding  Terri Sparks 02/25/2018,3:19 PM

## 2018-02-25 NOTE — Consult Note (Signed)
Requested by:  Dr. Randal Buba  Reason for consultation: Left leg pain   History of Present Illness   Terri Sparks is a 42 y.o. (18-Dec-1975) female s/p prior L leg TE and L CIA stenting who presents with chief complaint: recurrent left leg pain with skin changes.  Her sx were similar to her presentation in May 2018.  The patient noted onset at 2 PM yesterday.  The patient called ~7 PM with increased sx and was told to go to ED.  Triage note was 1222.  Pain is described as "hurts", severity 10/10, and associated with any movement.  Patient has attempted to treat this pain with pain rx.  The patient has rest pain symptoms.  The patient has no leg wounds/ulcers but noticed color change in skin left foot.  The patient cannot be certain if she had any cardiac arrhythmia.  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  Reportedly she has not been taking her Plavix as she cannot afford it anymore.  Past Medical History:  Diagnosis Date  . Anxiety   . Diabetes ([place])   . GERD (gastroesophageal reflux disease)   . History of blood clots   . Hypercalcemia   . Hyperparathyroidism   . Nephrolithiasis   . Renal calculi     Past Surgical History:  Procedure Laterality Date  . ABDOMINAL AORTOGRAM W/LOWER EXTREMITY N/A 11/14/2017   Procedure: ABDOMINAL AORTOGRAM W/LOWER EXTREMITY;  Surgeon: Waynetta Sandy, MD;  Location: [facility];  Service: Cardiovascular;  Laterality: N/A;  . EMBOLECTOMY Left 02/24/2017   Procedure: Left Lower Extremity Embolectomy and Angiogram.;  Surgeon: Waynetta Sandy, MD;  Location: [facility];  Service: Vascular;  Laterality: Left;  . INSERTION OF ILIAC STENT  02/24/2017   Procedure: INSERTION OF Common ILIAC STENT;  Surgeon: Waynetta Sandy, MD;  Location: [facility];  Service: Vascular;;  . LOWER EXTREMITY ANGIOGRAM Left 02/24/2017   Procedure: Aortagram, Left lower extremity Run-off;  Surgeon:  Waynetta Sandy, MD;  Location: [facility];  Service: Vascular;  Laterality: Left;  . 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 TUBAL LIGATION      Social History   Socioeconomic History  . Marital status: Single    Spouse name: Not on file  . Number of children: Not on file  . Years of education: Not on file  . Highest education level: Not on file  Occupational History  . Not on file  Social Needs  . Financial resource strain: Not on file  . Food insecurity:    Worry: Not on file    Inability: Not on file  . Transportation needs:    Medical: Not on file    Non-medical: Not on file  Tobacco Use  . Smoking status: Current Some Day Smoker    Packs/day: 0.25    Years: 15.00    Pack years: 3.75    Types: Cigarettes    Last attempt to quit: 03/04/2017    Years since quitting: 0.9  . Smokeless tobacco: Never Used  Substance and Sexual Activity  . Alcohol use: No  . Drug use: Yes    Types: Marijuana    Comment: occasionally  . Sexual activity: Not on file  Lifestyle  . Physical activity:    Days per week: Not on file    Minutes per session: Not on file  . Stress: Not on file  Relationships  .  Social connections:    Talks on phone: Not on file    Gets together: Not on file    Attends religious service: Not on file    Active member of club or organization: Not on file    Attends meetings of clubs or organizations: Not on file    Relationship status: Not on file  . Intimate partner violence:    Fear of current or ex partner: Not on file    Emotionally abused: Not on file    Physically abused: Not on file    Forced sexual activity: Not on file  Other Topics Concern  . Not on file  Social History Narrative  . Not on file    Family History  Problem Relation Age of Onset  . Depression Mother   . Diabetes Father   . Heart failure Father     Current Facility-Administered Medications  Medication Dose Route Frequency Provider Last Rate Last Dose  . heparin ADULT  infusion 100 units/mL (25000 units/227m sodium chloride 0.45%)  2,000 Units/hr Intravenous Continuous Palumbo, April, MD      . heparin bolus via infusion 5,000 Units  5,000 Units Intravenous Once Palumbo, April, MD       Current Outpatient Medications  Medication Sig Dispense Refill  . acetaminophen (TYLENOL) 500 MG tablet Take 1,000 mg by mouth every 6 (six) hours as needed for moderate pain.    . naproxen sodium (ALEVE) 220 MG tablet Take 220 mg by mouth daily as needed (pain).    . blood glucose meter kit and supplies Dispense based on patient and insurance preference. Use up to four times daily as directed. (FOR ICD-9 250.00, 250.01). 1 each 0  . Blood Glucose Monitoring Suppl (TRUE METRIX METER) w/Device KIT Use as directed 1 kit 0  . buPROPion (BUPROBAN) 150 MG 12 hr tablet Take 1 tablet (150 mg total) by mouth 2 (two) times daily. (Patient not taking: Reported on 02/25/2018) 60 tablet 2  . clopidogrel (PLAVIX) 75 MG tablet Take 1 tablet (75 mg total) by mouth daily. (Patient not taking: Reported on 02/25/2018) 30 tablet 10  . ferrous sulfate 325 (65 FE) MG tablet Take 1 tablet (325 mg total) by mouth daily with breakfast. (Patient not taking: Reported on 02/25/2018) 100 tablet 1  . gabapentin (NEURONTIN) 300 MG capsule Take 1 capsule (300 mg total) by mouth 2 (two) times daily. (Patient not taking: Reported on 02/25/2018) 60 capsule 3  . glucose blood (TRUE METRIX BLOOD GLUCOSE TEST) test strip Use as instructed 100 each 12  . glucose blood test strip Use as instructed 100 each 0  . hydrOXYzine (ATARAX/VISTARIL) 25 MG tablet Take 2 tablets (50 mg total) by mouth at bedtime. TAKE 1 TABLET BY MOUTH AT BEDTIME AS NEEDED FOR ANXIETY. (Patient not taking: Reported on 02/25/2018) 60 tablet 2  . insulin aspart (NOVOLOG) 100 UNIT/ML injection 5-15 units subcut TID with meals (Patient not taking: Reported on 02/25/2018) 10 mL 11  . insulin glargine (LANTUS) 100 UNIT/ML injection Inject 0.7 mLs (70 Units  total) into the skin daily. (Patient not taking: Reported on 02/25/2018) 10 mL 11  . Insulin Pen Needle 31G X 5 MM MISC Use with Lantus and Novolog injections. 100 each 11  . Insulin Syringe-Needle U-100 (BD INSULIN SYRINGE ULTRAFINE) 31G X 5/16" 0.5 ML MISC Use as directed 100 each 3  . lisinopril (PRINIVIL,ZESTRIL) 5 MG tablet Take 0.5 tablets (2.5 mg total) by mouth daily. (Patient not taking: Reported on 02/25/2018) 45 tablet  3  . nicotine (NICODERM CQ - DOSED IN MG/24 HOURS) 14 mg/24hr patch Place 1 patch (14 mg total) onto the skin daily. (Patient not taking: Reported on 02/25/2018) 28 patch 1  . omeprazole (PRILOSEC) 20 MG capsule Take 2 capsules (40 mg total) by mouth daily. (Patient not taking: Reported on 02/25/2018) 60 capsule 3  . sitaGLIPtin (JANUVIA) 50 MG tablet Take 1 tablet (50 mg total) by mouth daily. (Patient not taking: Reported on 02/25/2018) 30 tablet 3  . traZODone (DESYREL) 50 MG tablet Take 0.5 tablets (25 mg total) by mouth at bedtime as needed for sleep. (Patient not taking: Reported on 02/25/2018) 30 tablet 3  . TRUEPLUS LANCETS 28G MISC Use as directed 100 each 6    Allergies  Allergen Reactions  . Ciprofloxacin Hcl Hives  . Macrobid [Nitrofurantoin Macrocrystal] Hives, Shortness Of Breath and Rash  . Other Hives, Shortness Of Breath and Rash    NO "-CILLINS"!!!  . Penicillins Shortness Of Breath    Had had cephalosporins without incident  . Sulfa Antibiotics Hives, Shortness Of Breath and Rash  . Lexapro [Escitalopram Oxalate]     I just did not like it.    REVIEW OF SYSTEMS (negative unless checked):   Cardiac:  [] Chest pain or chest pressure? [] Shortness of breath upon activity? [] Shortness of breath when lying flat? [] Irregular heart rhythm?  Vascular:  [x] Pain in calf, thigh, or hip brought on by walking? [x] Pain in feet at night that wakes you up from your sleep? [] Blood clot in your veins? [] Leg swelling?  Pulmonary:  [] Oxygen at  home? [] Productive cough? [] Wheezing?  Neurologic:  [] Sudden weakness in arms or legs? [] Sudden numbness in arms or legs? [] Sudden onset of difficult speaking or slurred speech? [] Temporary loss of vision in one eye? [] Problems with dizziness?  Gastrointestinal:  [] Blood in stool? [] Vomited blood?  Genitourinary:  [] Burning when urinating? [] Blood in urine?  Psychiatric:  [] Major depression  Hematologic:  [] Bleeding problems? [] Problems with blood clotting?  Dermatologic:  [] Rashes or ulcers?  Constitutional:  [] Fever or chills?  Ear/Nose/Throat:  [] Change in hearing? [] Nose bleeds? [] Sore throat?  Musculoskeletal:  [] Back pain? [] Joint pain? [] Muscle pain?   For VQI Use Only   PRE-ADM LIVING Home  AMB STATUS Ambulatory  CAD Sx None  PRIOR CHF None  STRESS TEST No    Physical Examination     Vitals:   02/25/18 0252 02/25/18 0345 02/25/18 0400 02/25/18 0543  BP:  (!) 157/81 (!) 166/95 (!) 162/101  Pulse: (!) 101   (!) 104  Resp:    16  Temp:    97.8 F (36.6 C)  TempSrc:    Oral  SpO2: 95%   94%  Weight:      Height:       Body mass index is 39.47 kg/m.  General Alert, O x 3, WD, NAD  Head [place]/AT,    Ear/Nose/ Throat Hearing grossly intact, nares without erythema or drainage, oropharynx without Erythema or Exudate, Mallampati score: 3,   Eyes PERRLA, EOMI,    Neck Supple, mid-line trachea,    Pulmonary Sym exp, good B air movt, CTA B  Cardiac RRR, Nl S1, S2, no Murmurs, No rubs, No S3,S4  Vascular Vessel Right Left  Radial Palpable Palpable but less than right  Brachial Palpable Palpable  Carotid Palpable,  No Bruit Palpable, No Bruit  Aorta Not palpable N/A  Femoral Palpable Not palpable  Popliteal Not palpable Not palpable  PT Faintly palpable Not palpable  DP Palpable Not palpable    Gastro- intestinal soft, non-distended, non-tender to palpation, No guarding or rebound, no HSM, no masses, no CVAT B, No  palpable prominent aortic pulse,    Musculo- skeletal M/S 5/5 throughout except L DF/PF 4/5, Extremities without ischemic changes except pallorous L foot, No edema present, No visible varicosities ,   Neurologic Cranial nerves 2-12 intact , Pain and light touch intact in extremities except for decreased sensation in L foot, Motor exam as listed above  Psychiatric Judgement intact, Mood & affect appropriate for pt's clinical situation  Dermatologic See M/S exam for extremity exam, No rashes otherwise noted  Lymphatic  Palpable lymph nodes: None   Laboratory   CBC CBC Latest Ref Rng & Units 02/25/2018 02/25/2018 01/27/2018  WBC 4.0 - 10.5 K/uL - 18.6(H) 13.8(H)  Hemoglobin 12.0 - 15.0 g/dL 14.3 12.1 11.0(L)  Hematocrit 36.0 - 46.0 % 42.0 40.0 37.6  Platelets 150 - 400 K/uL - 491(H) 490(H)    BMP BMP Latest Ref Rng & Units 02/25/2018 02/25/2018 01/27/2018  Glucose 65 - 99 mg/dL 347(H) 344(H) 359(H)  BUN 6 - 20 mg/dL _0 Creatinine 0.44 - 1.00 mg/dL 1.50(H) 1.63(H) 1.45(H)  BUN/Creat Ratio 9 - 23 - - 10  Sodium 135 - 145 mmol/L 133(L) 131(L) 134  Potassium 3.5 - 5.1 mmol/L 4.0 3.9 4.7  Chloride 101 - 111 mmol/L 100(L) 99(L) 102  CO2 22 - 32 mmol/L - 19(L) 17(L)  Calcium 8.9 - 10.3 mg/dL - 9.8 9.3    Coagulation Lab Results  Component Value Date   INR 0.98 02/25/2018   No results found for: PTT  Lipids    Component Value Date/Time   CHOL 151 01/27/2018 1157   TRIG 167 (H) 01/27/2018 1157   HDL 36 (L) 01/27/2018 1157   CHOLHDL 4.2 01/27/2018 1157   LDLCALC 82 01/27/2018 1157   UDS: +BDZ, +Opiates, +Cocaine, +THC   Radiology     Ct Angio Ao+bifem W & Or Wo Contrast  Result Date: 02/25/2018 CLINICAL DATA:  Left leg cold and painful. Sudden onset yesterday about 2 p.m. Patient had CTA runoff on 02/24/2017 demonstrating thrombus in the left common iliac artery. Patient subsequently underwent left embolectomy procedure with lower extremity angiogram and insertion of iliac  stent. EXAM: CT ANGIOGRAPHY OF ABDOMINAL AORTA WITH ILIOFEMORAL RUNOFF TECHNIQUE: Multidetector CT imaging of the abdomen, pelvis and lower extremities was performed using the standard protocol during bolus administration of intravenous contrast. Multiplanar CT image reconstructions and MIPs were obtained to evaluate the vascular anatomy. CONTRAST:  22m ISOVUE-370 IOPAMIDOL (ISOVUE-370) INJECTION 76%. Reduced dose due to decreased GFR 38. COMPARISON:  02/24/2017 FINDINGS: VASCULAR Aorta: Normal caliber abdominal aorta. Aorta is patent. No dissection identified. No significant stenosis. Celiac: Celiac axis is patent without aneurysm or focal stenosis. SMA: Superior mesenteric artery is patent without aneurysm or focal stenosis. Renals: Single bilateral renal arteries appear patent with calcification present. No occlusion. Nephrograms are symmetrical. IMA: Inferior mesenteric artery is patent. RIGHT Lower Extremity Inflow: Right common iliac, internal iliac, and external iliac arteries are patent without evidence of significant stenosis. Outflow: Common, superficial and profunda femoral arteries and the popliteal artery are patent without evidence of aneurysm, dissection, vasculitis or significant stenosis. Runoff: Patent 3 vessel runoff to the right ankle. LEFT Lower Extremity Inflow: There is a  metallic wall stent in the left common iliac artery, new since previous study. No flow is demonstrated in the left common iliac artery, in the stent, or in the left internal and external iliac arteries. There is reconstitution of flow at the left common femoral artery likely via epigastric artery collaterals Outflow: Common, superficial and profunda femoral arteries are patent without evidence of aneurysm, dissection, vasculitis or significant stenosis. Left popliteal artery demonstrates no flow likely due to thrombosis. Runoff: The tibial trunk is thrombosed. There is reconstitution of flow to the proximal tibial vessels  with intermittent flow demonstrated within portions of the anterior and posterior tibial, and peroneal arteries. Diminished flow to the ankle is predominantly via the anterior tibial artery. Veins: No obvious venous abnormality within the limitations of this arterial phase study. Review of the MIP images confirms the above findings. NON-VASCULAR Lower chest: Mild dependent atelectasis. Hepatobiliary: No focal liver abnormality is seen. No gallstones, gallbladder wall thickening, or biliary dilatation. Pancreas: Unremarkable. No pancreatic ductal dilatation or surrounding inflammatory changes. Spleen: Normal in size without focal abnormality. Adrenals/Urinary Tract: No adrenal gland nodules. Bilateral renal parenchymal scarring with overall atrophy of the left kidney in comparison to the right. Nephrograms are symmetrical. Multiple stones are demonstrated in both kidneys. Largest stone in the right lower pole measures 13 mm maximal dimension and largest stone in the left kidney measures 13 mm in maximal dimension. No hydronephrosis or hydroureter. No ureteral stones are demonstrated. There is gas in the intrarenal collecting systems bilaterally. There is also gas in the bladder. This could arise from instrumentation or may indicate infection with gas-forming organism. No bladder wall thickening. Stomach/Bowel: Stomach is within normal limits. Appendix appears normal. No evidence of bowel wall thickening, distention, or inflammatory changes. Lymphatic: No significant lymphadenopathy. Reproductive: Uterus and bilateral adnexa are unremarkable. Other: No free air or free fluid in the abdomen. Abdominal wall musculature appears intact. Musculoskeletal: No destructive bone lesions.  Left popliteal cyst. IMPRESSION: VASCULAR 1. Interval placement of a left common iliac artery stent. The stent as well as the remainder the common iliac artery, external iliac artery, and internal iliac artery is thrombosed with no flow  demonstrated. Reconstitution of flow to the left common femoral artery likely via epigastric collaterals. 2. No flow demonstrated in the left popliteal artery and tibial trunk consistent with thrombosis. There is diminished and intermittent flow in the runoff vessels with only limited single vessel at best flow to the left ankle. NON-VASCULAR 1. Gas in the intrarenal collecting system of both kidneys as well as in the bladder. This could be due to instrumentation or may result from infection due to gas-forming organisms. Similar findings were present on the previous study from yesterday. 2. Multiple bilateral large nonobstructing intrarenal stones. 3. Left popliteal cyst. These results were called by telephone at the time of interpretation on 02/25/2018 at 5:12 am to PA. Quincy Carnes , who verbally acknowledged these results. Electronically Signed   By: Lucienne Capers M.D.   On: 02/25/2018 05:20    I reviewed the CTA, the L CIA stent is occluded with reconstitution of L femoral flow.  SFA appears patent.  Distal BK pop may be thrombosed, but I have found CTA with Runoff to misdiagnose low flow due to timing.   Medical Decision Making   Marleigh K Proffit is a 42 y.o. female who presents with: Recurrent thromboembolism LLE, DM with complication, polysubstance abuse,   Unclear etiology of iliac artery thrombosis vs embolism.  Regardless revascularization will be  attempted with thromboembolectomy of left iliac artery, possible femorofemoral bypass.  After completion, a limited left leg runoff was completed to determine if a popliteal artery thromboembolectomy will be needed.  Reviewing this patient recent angiogram reveals essential single vessel runoff that that is diseased in proximal segment.  I emphasized to this patient that is is a high risk for limb loss in this setting of recurrent rethromboembolism and diseased tibial arteries. The risk, benefits, and alternative for proposed operations were  discussed with the patient.   The patient is aware the risks include but are not limited to: bleeding, infection, myocardial infarction, stroke, limb loss, nerve damage, limb edema, need for additional procedures in the future, wound complications, and inability to complete the bypass.  I discussed with the patient the nature of angiographic procedures, especially the limited patencies of any endovascular intervention.   The patient is aware of that the risks of an angiographic procedure include but are not limited to: bleeding, infection, access site complications, renal failure, embolization, rupture of vessel, dissection, arteriovenous fistula, possible need for emergent surgical intervention, possible need for surgical procedures to treat the patient's pathology, anaphylactic reaction to contrast, and stroke and death.   The patient is aware she is at risk for contrast induced nephropathy given substantial contrast load already given by CTA. The delay in care due to delayed presentation to ED and multidrug use increase the risk of major adverse outcome. The patient is aware of the risks and agrees to proceed.  Thank you for allowing [name] to participate in this patient's care.   Adele Barthel, MD, FACS [facility] Office: [phone] Pager: [phone]  02/25/2018, 6:40 AM

## 2018-02-25 NOTE — ED Triage Notes (Signed)
Pt c/o L lower extremity pain onset 1400 today, pt states she had blood clot last year in same leg, states pain feels similar.

## 2018-02-25 NOTE — Transfer of Care (Signed)
Immediate Anesthesia Transfer of Care Note  Patient: Terri Sparks  Procedure(s) Performed: LEFT ILIAC AND POPLITEAL ARTERY THROMBECTOMY (Left ) INTRA OPERATIVE ARTERIOGRAM WITH LEFT LEG RUNOFF (Left ) BALLOON ANGIOPLASTY LEFT ILIAC ARTERY (Left ) PATCH ANGIOPLASTY LEFT SUPERFICIAL FEMORAL ARTERY WITH BOVINE PATCH (Left )  Patient Location: PACU  Anesthesia Type:General  Level of Consciousness: awake  Airway & Oxygen Therapy: Patient Spontanous Breathing and Patient connected to face mask oxygen  Post-op Assessment: Report given to RN and Post -op Vital signs reviewed and stable  Post vital signs: Reviewed and stable  Last Vitals:  Vitals Value Taken Time  BP 138/67 02/25/2018 12:47 PM  Temp    Pulse 103 02/25/2018 12:57 PM  Resp 15 02/25/2018 12:57 PM  SpO2 99 % 02/25/2018 12:57 PM  Vitals shown include unvalidated device data.  Last Pain:  Vitals:   02/25/18 0651  TempSrc:   PainSc: 10-Worst pain ever         Complications: No apparent anesthesia complications

## 2018-02-25 NOTE — Anesthesia Procedure Notes (Signed)
Procedure Name: Intubation Date/Time: 02/25/2018 8:15 AM Performed by: Clearnce Sorrel, CRNA Pre-anesthesia Checklist: Patient identified, Emergency Drugs available, Suction available, Patient being monitored and Timeout performed Patient Re-evaluated:Patient Re-evaluated prior to induction Oxygen Delivery Method: Circle system utilized Preoxygenation: Pre-oxygenation with 100% oxygen Induction Type: IV induction Ventilation: Mask ventilation without difficulty Laryngoscope Size: Mac and 3 Grade View: Grade II Tube type: Subglottic suction tube Tube size: 7.0 mm Number of attempts: 2 Airway Equipment and Method: Stylet and Bougie stylet Placement Confirmation: positive ETCO2 and breath sounds checked- equal and bilateral Secured at: 22 cm Tube secured with: Tape Dental Injury: Teeth and Oropharynx as per pre-operative assessment

## 2018-02-26 LAB — BASIC METABOLIC PANEL
Anion gap: 8 (ref 5–15)
BUN: 8 mg/dL (ref 6–20)
CO2: 22 mmol/L (ref 22–32)
Calcium: 8.1 mg/dL — ABNORMAL LOW (ref 8.9–10.3)
Chloride: 101 mmol/L (ref 101–111)
Creatinine, Ser: 1.33 mg/dL — ABNORMAL HIGH (ref 0.44–1.00)
GFR calc Af Amer: 56 mL/min — ABNORMAL LOW (ref >60)
GFR calc non Af Amer: 48 mL/min — ABNORMAL LOW (ref >60)
Glucose, Bld: 327 mg/dL — ABNORMAL HIGH (ref 65–99)
Potassium: 4.1 mmol/L (ref 3.5–5.1)
Sodium: 131 mmol/L — ABNORMAL LOW (ref 135–145)

## 2018-02-26 LAB — CBC
HCT: 30.4 % — ABNORMAL LOW (ref 36.0–46.0)
Hemoglobin: 9 g/dL — ABNORMAL LOW (ref 12.0–15.0)
MCH: 23.3 pg — ABNORMAL LOW (ref 26.0–34.0)
MCHC: 29.6 g/dL — ABNORMAL LOW (ref 30.0–36.0)
MCV: 78.8 fL (ref 78.0–100.0)
Platelets: 387 10*3/uL (ref 150–400)
RBC: 3.86 MIL/uL — ABNORMAL LOW (ref 3.87–5.11)
RDW: 17 % — ABNORMAL HIGH (ref 11.5–15.5)
WBC: 12.1 10*3/uL — ABNORMAL HIGH (ref 4.0–10.5)

## 2018-02-26 LAB — GLUCOSE, CAPILLARY
Glucose-Capillary: 264 mg/dL — ABNORMAL HIGH (ref 65–99)
Glucose-Capillary: 328 mg/dL — ABNORMAL HIGH (ref 65–99)
Glucose-Capillary: 235 mg/dL — ABNORMAL HIGH (ref 65–99)
Glucose-Capillary: 252 mg/dL — ABNORMAL HIGH (ref 65–99)

## 2018-02-26 LAB — HEPARIN LEVEL (UNFRACTIONATED): Heparin Unfractionated: 0.38 IU/mL (ref 0.30–0.70)

## 2018-02-26 MED ORDER — MICONAZOLE NITRATE 2 % VA CREA
1.0000 | TOPICAL_CREAM | Freq: Every day | VAGINAL | Status: DC
  Administered 2018-02-26 – 2018-03-06 (×9): 1 via VAGINAL
  Filled 2018-02-26 (×5): qty 45

## 2018-02-26 MED ORDER — WARFARIN - PHARMACIST DOSING INPATIENT
Freq: Every day | Status: DC
  Administered 2018-02-26 – 2018-03-02 (×4)

## 2018-02-26 MED ORDER — DIPHENHYDRAMINE HCL 25 MG PO CAPS
25.0000 mg | ORAL_CAPSULE | Freq: Four times a day (QID) | ORAL | Status: DC | PRN
  Administered 2018-02-26 – 2018-03-01 (×4): 25 mg via ORAL
  Filled 2018-02-26 (×5): qty 1

## 2018-02-26 MED ORDER — ENOXAPARIN SODIUM 120 MG/0.8ML ~~LOC~~ SOLN
115.0000 mg | Freq: Two times a day (BID) | SUBCUTANEOUS | Status: DC
  Administered 2018-02-26 – 2018-03-02 (×10): 115 mg via SUBCUTANEOUS
  Filled 2018-02-26 (×11): qty 0.77

## 2018-02-26 MED ORDER — INSULIN GLARGINE 100 UNIT/ML ~~LOC~~ SOLN
70.0000 [IU] | Freq: Every day | SUBCUTANEOUS | Status: DC
  Administered 2018-02-26 – 2018-03-07 (×10): 70 [IU] via SUBCUTANEOUS
  Filled 2018-02-26 (×10): qty 0.7

## 2018-02-26 MED ORDER — WARFARIN SODIUM 7.5 MG PO TABS
7.5000 mg | ORAL_TABLET | Freq: Once | ORAL | Status: AC
  Administered 2018-02-26: 7.5 mg via ORAL
  Filled 2018-02-26: qty 1

## 2018-02-26 NOTE — Plan of Care (Signed)
Care plans reviewed and patient is progressing.  

## 2018-02-26 NOTE — Evaluation (Signed)
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 PMH - polysubstance abuse, dm, anxiety, thromboembolism of LLE 02/2017  Clinical Impression  Pt doing well with mobility and no further PT needed.       Follow Up Recommendations No PT follow up    Equipment Recommendations  None recommended by PT    Recommendations for Other Services       Precautions / Restrictions Precautions Precautions: None Restrictions Weight Bearing Restrictions: No      Mobility  Bed Mobility Overal bed mobility: Modified Independent                Transfers Overall transfer level: Modified independent Equipment used: None                Ambulation/Gait Ambulation/Gait assistance: Modified independent (Device/Increase time) Ambulation Distance (Feet): 450 Feet Assistive device: None;IV Pole Gait Pattern/deviations: Step-through pattern;Decreased stride length   Gait velocity interpretation: >2.62 ft/sec, indicative of community ambulatory General Gait Details: Steady gait with or without use of IV pole  Stairs            Wheelchair Mobility    Modified Rankin (Stroke Patients Only)       Balance Overall balance assessment: No apparent balance deficits (not formally assessed)                                           Pertinent Vitals/Pain Pain Assessment: Faces Faces Pain Scale: Hurts little more Pain Location: LLE Pain Descriptors / Indicators: Sore Pain Intervention(s): Limited activity within patient's tolerance;Repositioned    Home Living Family/patient expects to be discharged to:: Private residence Living Arrangements: Children;Spouse/significant other Available Help at Discharge: Family           Home Equipment: Kasandra Knudsen -  single point Additional Comments: Pt in process of moving into a new home    Prior Function Level of Independence: Independent               Hand Dominance   Dominant Hand: Right    Extremity/Trunk Assessment   Upper Extremity Assessment Upper Extremity Assessment: Overall WFL for tasks assessed    Lower Extremity Assessment Lower Extremity Assessment: Overall WFL for tasks assessed       Communication   Communication: No difficulties  Cognition Arousal/Alertness: Awake/alert Behavior During Therapy: WFL for tasks assessed/performed Overall Cognitive Status: Within Functional Limits for tasks assessed                                        General Comments      Exercises     Assessment/Plan    PT Assessment Patent does not need any further PT services  PT Problem List         PT Treatment Interventions      PT Goals (Current goals can be found in the Care Plan section)  Acute Rehab PT Goals PT Goal Formulation: All assessment and education complete, DC therapy    Frequency     Barriers to discharge        Co-evaluation  AM-PAC PT "6 Clicks" Daily Activity  Outcome Measure Difficulty turning over in bed (including adjusting bedclothes, sheets and blankets)?: A Little Difficulty moving from lying on back to sitting on the side of the bed? : A Little Difficulty sitting down on and standing up from a chair with arms (e.g., wheelchair, bedside commode, etc,.)?: A Little Help needed moving to and from a bed to chair (including a wheelchair)?: None Help needed walking in hospital room?: None Help needed climbing 3-5 steps with a railing? : None 6 Click Score: 21    End of Session   Activity Tolerance: Patient tolerated treatment well Patient left: in bed;with call bell/phone within reach Nurse Communication: Mobility status PT Visit Diagnosis: Other abnormalities of gait and mobility (R26.89)    Time:  1141-1200 PT Time Calculation (min) (ACUTE ONLY): 19 min   Charges:   PT Evaluation $PT Eval Low Complexity: 1 Low     PT G CodesMarland Kitchen        [facility] [place] 02/26/2018, 1:24 PM

## 2018-02-26 NOTE — Progress Notes (Signed)
ANTICOAGULATION CONSULT NOTE   Pharmacy Consult for Lovenox/ Warfarin Indication: LLE ischemia/thrombosis  Allergies  Allergen Reactions  . Ciprofloxacin Hcl Hives  . Macrobid [Nitrofurantoin Macrocrystal] Hives, Shortness Of Breath and Rash  . Other Hives, Shortness Of Breath and Rash    NO "-CILLINS"!!!  . Penicillins Shortness Of Breath    Had had cephalosporins without incident  . Sulfa Antibiotics Hives, Shortness Of Breath and Rash  . Lexapro [Escitalopram Oxalate]     I just did not like it.    Patient Measurements: Height: 5' 7" (170.2 cm) Weight: 252 lb (114.3 kg) IBW/kg (Calculated) : 61.6 Heparin Dosing Weight: 90 kg  Vital Signs: Temp: 98.6 F (37 C) (05/26 0511) Temp Source: Oral (05/26 0511) BP: 111/68 (05/26 0511) Pulse Rate: 102 (05/26 0511)  Labs: Recent Labs    02/25/18 0130 02/25/18 0139 02/25/18 0855 02/25/18 1150 02/25/18 1254 02/25/18 2305 02/26/18 0048 02/26/18 0255  HGB 12.1 14.3 12.2 10.9*  --   --   --   --   HCT 40.0 42.0 36.0 32.0*  --   --   --   --   PLT 491*  --   --   --   --   --   --   --   APTT 28  --   --   --   --   --   --   --   LABPROT 12.9  --   --   --   --   --   --   --   INR 0.98  --   --   --   --   --   --   --   HEPARINUNFRC  --   --   --   --  0.72* 0.49  --  0.38  CREATININE 1.63* 1.50*  --   --   --   --  1.33*  --    Estimated Creatinine Clearance: 71.2 mL/min (A) (by C-G formula based on SCr of 1.33 mg/dL (H)).  Medical History: Past Medical History:  Diagnosis Date  . Anxiety   . Diabetes (HCC)   . GERD (gastroesophageal reflux disease)   . History of blood clots   . Hypercalcemia   . Hyperparathyroidism   . Nephrolithiasis   . Renal calculi    Medications:  No current facility-administered medications on file prior to encounter.    Current Outpatient Medications on File Prior to Encounter  Medication Sig Dispense Refill  . acetaminophen (TYLENOL) 500 MG tablet Take 1,000 mg by mouth every 6  (six) hours as needed for moderate pain.    . naproxen sodium (ALEVE) 220 MG tablet Take 220 mg by mouth daily as needed (pain).    . blood glucose meter kit and supplies Dispense based on patient and insurance preference. Use up to four times daily as directed. (FOR ICD-9 250.00, 250.01). 1 each 0  . Blood Glucose Monitoring Suppl (TRUE METRIX METER) w/Device KIT Use as directed 1 kit 0  . buPROPion (BUPROBAN) 150 MG 12 hr tablet Take 1 tablet (150 mg total) by mouth 2 (two) times daily. (Patient not taking: Reported on 02/25/2018) 60 tablet 2  . clopidogrel (PLAVIX) 75 MG tablet Take 1 tablet (75 mg total) by mouth daily. (Patient not taking: Reported on 02/25/2018) 30 tablet 10  . ferrous sulfate 325 (65 FE) MG tablet Take 1 tablet (325 mg total) by mouth daily with breakfast. (Patient not taking: Reported on 02/25/2018) 100 tablet 1  .   gabapentin (NEURONTIN) 300 MG capsule Take 1 capsule (300 mg total) by mouth 2 (two) times daily. (Patient not taking: Reported on 02/25/2018) 60 capsule 3  . glucose blood (TRUE METRIX BLOOD GLUCOSE TEST) test strip Use as instructed 100 each 12  . glucose blood test strip Use as instructed 100 each 0  . hydrOXYzine (ATARAX/VISTARIL) 25 MG tablet Take 2 tablets (50 mg total) by mouth at bedtime. TAKE 1 TABLET BY MOUTH AT BEDTIME AS NEEDED FOR ANXIETY. (Patient not taking: Reported on 02/25/2018) 60 tablet 2  . insulin aspart (NOVOLOG) 100 UNIT/ML injection 5-15 units subcut TID with meals (Patient not taking: Reported on 02/25/2018) 10 mL 11  . insulin glargine (LANTUS) 100 UNIT/ML injection Inject 0.7 mLs (70 Units total) into the skin daily. (Patient not taking: Reported on 02/25/2018) 10 mL 11  . Insulin Pen Needle 31G X 5 MM MISC Use with Lantus and Novolog injections. 100 each 11  . Insulin Syringe-Needle U-100 (BD INSULIN SYRINGE ULTRAFINE) 31G X 5/16" 0.5 ML MISC Use as directed 100 each 3  . lisinopril (PRINIVIL,ZESTRIL) 5 MG tablet Take 0.5 tablets (2.5 mg total)  by mouth daily. (Patient not taking: Reported on 02/25/2018) 45 tablet 3  . nicotine (NICODERM CQ - DOSED IN MG/24 HOURS) 14 mg/24hr patch Place 1 patch (14 mg total) onto the skin daily. (Patient not taking: Reported on 02/25/2018) 28 patch 1  . omeprazole (PRILOSEC) 20 MG capsule Take 2 capsules (40 mg total) by mouth daily. (Patient not taking: Reported on 02/25/2018) 60 capsule 3  . sitaGLIPtin (JANUVIA) 50 MG tablet Take 1 tablet (50 mg total) by mouth daily. (Patient not taking: Reported on 02/25/2018) 30 tablet 3  . traZODone (DESYREL) 50 MG tablet Take 0.5 tablets (25 mg total) by mouth at bedtime as needed for sleep. (Patient not taking: Reported on 02/25/2018) 30 tablet 3  . TRUEPLUS LANCETS 28G MISC Use as directed 100 each 46    Assessment:42 y.o. female with Terri iliac and popliteal thrombus now s/p OR 5/25 for thromboembolectomy. Pharmacy consulted to transition patient from heparin infusion to warfarin with a Lovenox bridge. HgB down this morning to 10.9 following yesterdays procedure, but no overt bleeding reported.   Goal of Therapy:  INR:2-3 Monitor platelets by anticoagulation protocol: Yes   Plan:  Warfarin 7.57m x1 tonight Lovenox 1 mg/kg every 12 hours Monitor INR/CBC/BMP Monitor for s/sx of bleeding  Terri Sparks Terri Sparks 02/26/2018,11:45 AM

## 2018-02-26 NOTE — Discharge Instructions (Signed)

## 2018-02-26 NOTE — Progress Notes (Signed)
[place] for Heparin Indication: LLE ischemia/thrombosis  Allergies  Allergen Reactions  . Ciprofloxacin Hcl Hives  . Macrobid [Nitrofurantoin Macrocrystal] Hives, Shortness Of Breath and Rash  . Other Hives, Shortness Of Breath and Rash    NO "-CILLINS"!!!  . Penicillins Shortness Of Breath    Had had cephalosporins without incident  . Sulfa Antibiotics Hives, Shortness Of Breath and Rash  . Lexapro [Escitalopram Oxalate]     I just did not like it.    Patient Measurements: Height: 5\' 7"  (170.2 cm) Weight: 252 lb (114.3 kg) IBW/kg (Calculated) : 61.6 Heparin Dosing Weight: 90 kg  Vital Signs: Temp: 98.9 F (37.2 C) (05/26 0056) Temp Source: Oral (05/26 0056) BP: 126/71 (05/26 0056) Pulse Rate: 109 (05/26 0056)  Labs: Recent Labs    02/25/18 0130 02/25/18 0139 02/25/18 0855 02/25/18 1150 02/25/18 1254 02/25/18 2305  HGB 12.1 14.3 12.2 10.9*  --   --   HCT 40.0 42.0 36.0 32.0*  --   --   PLT 491*  --   --   --   --   --   APTT 28  --   --   --   --   --   LABPROT 12.9  --   --   --   --   --   INR 0.98  --   --   --   --   --   HEPARINUNFRC  --   --   --   --  0.72* 0.49  CREATININE 1.63* 1.50*  --   --   --   --    Estimated Creatinine Clearance: 63.1 mL/min (A) (by C-G formula based on SCr of 1.5 mg/dL (H)).   Assessment 42 y.o. female with L iliac and popliteal thrombus now s/p OR 5/25 for thromboembolectomy, for heparin  Goal of Therapy:  Heparin level 0.3-0.7 units/ml Monitor platelets by anticoagulation protocol: Yes   Plan:  Continue Heparin at current rate  Brinden Kincheloe, Bronson Curb 02/26/2018,1:08 AM

## 2018-02-26 NOTE — Progress Notes (Signed)
   Daily Progress Note   Assessment/Planning:   POD #1 s/p L CIA, pop, and AT TE via femoral approach, L SFA BPA, L CIA PTA   Pt already walking around  Still some pain in lateral calf (reproducible pain to palpation): unclear etiology though palpable firmness in lateral calf suggestive of spasm  Post-op ABI pending  Switch Heparin drip to Lovenox  Load coumadin   Subjective  - 1 Day Post-Op   Pain better, still some lateral L calf pain   Objective   Vitals:   02/25/18 2057 02/26/18 0056 02/26/18 0511 02/26/18 0800  BP: 121/76 126/71 111/68   Pulse: (!) 103 (!) 109 (!) 102   Resp: 16 18 20  (!) 26  Temp: 98.2 F (36.8 C) 98.9 F (37.2 C) 98.6 F (37 C)   TempSrc: Oral Oral Oral   SpO2: 99% 100% 98%   Weight:      Height:         Intake/Output Summary (Last 24 hours) at 02/26/2018 1123 Last data filed at 02/26/2018 0900 Gross per 24 hour  Intake 5275.49 ml  Output 3974 ml  Net 1301.49 ml    PULM  CTAB  CV  RRR  GI  soft, NTND  VASC L groin incision bandage, JP put out nothing, some serosang clot  NEURO DF/PF 5/5 in L foot, sensation intact    Laboratory   CBC CBC Latest Ref Rng & Units 02/25/2018 02/25/2018 02/25/2018  WBC 4.0 - 10.5 K/uL - - -  Hemoglobin 12.0 - 15.0 g/dL 10.9(L) 12.2 14.3  Hematocrit 36.0 - 46.0 % 32.0(L) 36.0 42.0  Platelets 150 - 400 K/uL - - -    BMET    Component Value Date/Time   NA 131 (L) 02/26/2018 0048   NA 134 01/27/2018 1157   K 4.1 02/26/2018 0048   CL 101 02/26/2018 0048   CO2 22 02/26/2018 0048   GLUCOSE 327 (H) 02/26/2018 0048   BUN 8 02/26/2018 0048   BUN 14 01/27/2018 1157   CREATININE 1.33 (H) 02/26/2018 0048   CALCIUM 8.1 (L) 02/26/2018 0048   GFRNONAA 48 (L) 02/26/2018 0048   GFRAA 56 (L) 02/26/2018 0048     Adele Barthel, MD, FACS [facility] [place] Office: [phone] Pager: [phone]  02/26/2018, 11:23 AM

## 2018-02-27 ENCOUNTER — Other Ambulatory Visit: Payer: Self-pay

## 2018-02-27 ENCOUNTER — Encounter (HOSPITAL_COMMUNITY): Payer: Self-pay | Admitting: Vascular Surgery

## 2018-02-27 ENCOUNTER — Inpatient Hospital Stay (HOSPITAL_COMMUNITY): Payer: Medicaid Other

## 2018-02-27 DIAGNOSIS — I739 Peripheral vascular disease, unspecified: Secondary | ICD-10-CM

## 2018-02-27 LAB — CBC
HCT: 29.6 % — ABNORMAL LOW (ref 36.0–46.0)
Hemoglobin: 8.6 g/dL — ABNORMAL LOW (ref 12.0–15.0)
MCH: 23.4 pg — ABNORMAL LOW (ref 26.0–34.0)
MCHC: 29.1 g/dL — ABNORMAL LOW (ref 30.0–36.0)
MCV: 80.4 fL (ref 78.0–100.0)
Platelets: 370 10*3/uL (ref 150–400)
RBC: 3.68 MIL/uL — ABNORMAL LOW (ref 3.87–5.11)
RDW: 17.1 % — ABNORMAL HIGH (ref 11.5–15.5)
WBC: 13 10*3/uL — ABNORMAL HIGH (ref 4.0–10.5)

## 2018-02-27 LAB — MRSA PCR SCREENING: MRSA by PCR: NEGATIVE

## 2018-02-27 LAB — BASIC METABOLIC PANEL
Anion gap: 8 (ref 5–15)
BUN: 9 mg/dL (ref 6–20)
CO2: 23 mmol/L (ref 22–32)
Calcium: 8.4 mg/dL — ABNORMAL LOW (ref 8.9–10.3)
Chloride: 102 mmol/L (ref 101–111)
Creatinine, Ser: 1.35 mg/dL — ABNORMAL HIGH (ref 0.44–1.00)
GFR calc Af Amer: 55 mL/min — ABNORMAL LOW (ref >60)
GFR calc non Af Amer: 47 mL/min — ABNORMAL LOW (ref >60)
Glucose, Bld: 278 mg/dL — ABNORMAL HIGH (ref 65–99)
Potassium: 4.3 mmol/L (ref 3.5–5.1)
Sodium: 133 mmol/L — ABNORMAL LOW (ref 135–145)

## 2018-02-27 LAB — GLUCOSE, CAPILLARY
Glucose-Capillary: 253 mg/dL — ABNORMAL HIGH (ref 65–99)
Glucose-Capillary: 310 mg/dL — ABNORMAL HIGH (ref 65–99)
Glucose-Capillary: 153 mg/dL — ABNORMAL HIGH (ref 65–99)
Glucose-Capillary: 193 mg/dL — ABNORMAL HIGH (ref 65–99)

## 2018-02-27 LAB — PROTIME-INR
INR: 0.95
Prothrombin Time: 12.6 seconds (ref 11.4–15.2)

## 2018-02-27 MED ORDER — WARFARIN SODIUM 7.5 MG PO TABS
7.5000 mg | ORAL_TABLET | Freq: Once | ORAL | Status: AC
  Administered 2018-02-27: 7.5 mg via ORAL
  Filled 2018-02-27: qty 1

## 2018-02-27 NOTE — Plan of Care (Signed)
Care plans reviewed and patient is progressing.  

## 2018-02-27 NOTE — Progress Notes (Signed)
ANTICOAGULATION CONSULT NOTE   Pharmacy Consult for Lovenox/ Warfarin Indication: LLE ischemia/thrombosis  Allergies  Allergen Reactions  . Ciprofloxacin Hcl Hives  . Macrobid [Nitrofurantoin Macrocrystal] Hives, Shortness Of Breath and Rash  . Other Hives, Shortness Of Breath and Rash    NO "-CILLINS"!!!  . Penicillins Shortness Of Breath    Had had cephalosporins without incident  . Sulfa Antibiotics Hives, Shortness Of Breath and Rash  . Lexapro [Escitalopram Oxalate]     I just did not like it.    Patient Measurements: Height: 5' 7" (170.2 cm) Weight: 252 lb (114.3 kg) IBW/kg (Calculated) : 61.6 Heparin Dosing Weight: 90 kg  Vital Signs: Temp: 97.9 F (36.6 C) (05/27 0839) Temp Source: Oral (05/27 0839) BP: 123/68 (05/27 0839) Pulse Rate: 96 (05/27 0839)  Labs: Recent Labs    02/25/18 0130 02/25/18 0139  02/25/18 1150 02/25/18 1254 02/25/18 2305 02/26/18 0048 02/26/18 0255 02/26/18 2207 02/27/18 0257 02/27/18 0926  HGB 12.1 14.3   < > 10.9*  --   --   --   --  9.0* 8.6*  --   HCT 40.0 42.0   < > 32.0*  --   --   --   --  30.4* 29.6*  --   PLT 491*  --   --   --   --   --   --   --  387 370  --   APTT 28  --   --   --   --   --   --   --   --   --   --   LABPROT 12.9  --   --   --   --   --   --   --   --   --  12.6  INR 0.98  --   --   --   --   --   --   --   --   --  0.95  HEPARINUNFRC  --   --   --   --  0.72* 0.49  --  0.38  --   --   --   CREATININE 1.63* 1.50*  --   --   --   --  1.33*  --   --  1.35*  --    < > = values in this interval not displayed.   Estimated Creatinine Clearance: 70.2 mL/min (A) (by C-G formula based on SCr of 1.35 mg/dL (H)).  Medical History: Past Medical History:  Diagnosis Date  . Anxiety   . Diabetes (HCC)   . GERD (gastroesophageal reflux disease)   . History of blood clots   . Hypercalcemia   . Hyperparathyroidism   . Nephrolithiasis   . Renal calculi    Medications:  No current facility-administered  medications on file prior to encounter.    Current Outpatient Medications on File Prior to Encounter  Medication Sig Dispense Refill  . acetaminophen (TYLENOL) 500 MG tablet Take 1,000 mg by mouth every 6 (six) hours as needed for moderate pain.    . naproxen sodium (ALEVE) 220 MG tablet Take 220 mg by mouth daily as needed (pain).    . blood glucose meter kit and supplies Dispense based on patient and insurance preference. Use up to four times daily as directed. (FOR ICD-9 250.00, 250.01). 1 each 0  . Blood Glucose Monitoring Suppl (TRUE METRIX METER) w/Device KIT Use as directed 1 kit 0  . buPROPion (BUPROBAN) 150 MG 12 hr   tablet Take 1 tablet (150 mg total) by mouth 2 (two) times daily. (Patient not taking: Reported on 02/25/2018) 60 tablet 2  . clopidogrel (PLAVIX) 75 MG tablet Take 1 tablet (75 mg total) by mouth daily. (Patient not taking: Reported on 02/25/2018) 30 tablet 10  . ferrous sulfate 325 (65 FE) MG tablet Take 1 tablet (325 mg total) by mouth daily with breakfast. (Patient not taking: Reported on 02/25/2018) 100 tablet 1  . gabapentin (NEURONTIN) 300 MG capsule Take 1 capsule (300 mg total) by mouth 2 (two) times daily. (Patient not taking: Reported on 02/25/2018) 60 capsule 3  . glucose blood (TRUE METRIX BLOOD GLUCOSE TEST) test strip Use as instructed 100 each 12  . glucose blood test strip Use as instructed 100 each 0  . hydrOXYzine (ATARAX/VISTARIL) 25 MG tablet Take 2 tablets (50 mg total) by mouth at bedtime. TAKE 1 TABLET BY MOUTH AT BEDTIME AS NEEDED FOR ANXIETY. (Patient not taking: Reported on 02/25/2018) 60 tablet 2  . insulin aspart (NOVOLOG) 100 UNIT/ML injection 5-15 units subcut TID with meals (Patient not taking: Reported on 02/25/2018) 10 mL 11  . insulin glargine (LANTUS) 100 UNIT/ML injection Inject 0.7 mLs (70 Units total) into the skin daily. (Patient not taking: Reported on 02/25/2018) 10 mL 11  . Insulin Pen Needle 31G X 5 MM MISC Use with Lantus and Novolog  injections. 100 each 11  . Insulin Syringe-Needle U-100 (BD INSULIN SYRINGE ULTRAFINE) 31G X 5/16" 0.5 ML MISC Use as directed 100 each 3  . lisinopril (PRINIVIL,ZESTRIL) 5 MG tablet Take 0.5 tablets (2.5 mg total) by mouth daily. (Patient not taking: Reported on 02/25/2018) 45 tablet 3  . nicotine (NICODERM CQ - DOSED IN MG/24 HOURS) 14 mg/24hr patch Place 1 patch (14 mg total) onto the skin daily. (Patient not taking: Reported on 02/25/2018) 28 patch 1  . omeprazole (PRILOSEC) 20 MG capsule Take 2 capsules (40 mg total) by mouth daily. (Patient not taking: Reported on 02/25/2018) 60 capsule 3  . sitaGLIPtin (JANUVIA) 50 MG tablet Take 1 tablet (50 mg total) by mouth daily. (Patient not taking: Reported on 02/25/2018) 30 tablet 3  . traZODone (DESYREL) 50 MG tablet Take 0.5 tablets (25 mg total) by mouth at bedtime as needed for sleep. (Patient not taking: Reported on 02/25/2018) 30 tablet 3  . TRUEPLUS LANCETS 28G MISC Use as directed 100 each 6    Assessment:42 y.o. female with L iliac and popliteal thrombus now s/p OR 5/25 for thromboembolectomy. Pharmacy consulted to transition patient from heparin infusion to warfarin with a Lovenox bridge. HgB down again this morning to 8.6, but no overt bleeding reported.   INR this morning is 0.95.   Goal of Therapy:  INR:2-3 Monitor platelets by anticoagulation protocol: Yes   Plan:  Warfarin 7.5mg x1 tonight Lovenox 1 mg/kg every 12 hours Monitor INR/CBC Monitor for s/sx of bleeding  Sabrina Dunham, PharmD Pharmacy Resident Pager: [phone]   

## 2018-02-27 NOTE — Progress Notes (Addendum)
  Progress Note    02/27/2018 1:48 PM 2 Days Post-Op  Subjective:  Persistent pain lateral L ankle; pain reproducible with palpation and AROM   Vitals:   02/27/18 0839 02/27/18 1301  BP: 123/68 113/72  Pulse: 96 100  Resp: 19 19  Temp: 97.9 F (36.6 C) 97.8 F (36.6 C)  SpO2: 95%    Physical Exam: Lungs:  Non labored Incisions:  L groin incision with staples in place, no dehiscence noted, no drainage noted Extremities:  L palpable DP Abdomen:  Soft Neurologic: A&O  CBC    Component Value Date/Time   WBC 13.0 (H) 02/27/2018 0257   RBC 3.68 (L) 02/27/2018 0257   HGB 8.6 (L) 02/27/2018 0257   HGB 11.0 (L) 01/27/2018 1157   HCT 29.6 (L) 02/27/2018 0257   HCT 37.6 01/27/2018 1157   PLT 370 02/27/2018 0257   PLT 490 (H) 01/27/2018 1157   MCV 80.4 02/27/2018 0257   MCV 78 (L) 01/27/2018 1157   MCH 23.4 (L) 02/27/2018 0257   MCHC 29.1 (L) 02/27/2018 0257   RDW 17.1 (H) 02/27/2018 0257   RDW 17.1 (H) 01/27/2018 1157   LYMPHSABS 3.8 02/25/2018 0130   MONOABS 1.0 02/25/2018 0130   EOSABS 0.3 02/25/2018 0130   BASOSABS 0.1 02/25/2018 0130    BMET    Component Value Date/Time   NA 133 (L) 02/27/2018 0257   NA 134 01/27/2018 1157   K 4.3 02/27/2018 0257   CL 102 02/27/2018 0257   CO2 23 02/27/2018 0257   GLUCOSE 278 (H) 02/27/2018 0257   BUN 9 02/27/2018 0257   BUN 14 01/27/2018 1157   CREATININE 1.35 (H) 02/27/2018 0257   CALCIUM 8.4 (L) 02/27/2018 0257   GFRNONAA 47 (L) 02/27/2018 0257   GFRAA 55 (L) 02/27/2018 0257    INR    Component Value Date/Time   INR 0.95 02/27/2018 0926     Intake/Output Summary (Last 24 hours) at 02/27/2018 1348 Last data filed at 02/27/2018 0300 Gross per 24 hour  Intake 1840 ml  Output 80 ml  Net 1760 ml     Assessment/Plan:  42 y.o. female is s/p L CIA, pop, and AT TE via femoral approach, L SFA BPA, L CIA PTA   2 Days Post-Op   D/c JP drain Continue dry dressing L groin daily and prn to prevent incision  breakdown Palpable L DP; continue periodic pulse checks Encouraged ambulation Pharmacy dosing lovenox, coumadin D/c when INR therapeutic; recheck INR in am  DVT prophylaxis:  As above   Dagoberto Ligas, PA-C [facility] [phone] 02/27/2018 1:48 PM  Addendum  I have independently interviewed and examined the patient, and I agree with the physician assistant's findings.      RIGHT    LEFT    PRESSURE WAVEFORM  PRESSURE WAVEFORM  BRACHIAL 103 triphasic BRACHIAL 103 triphasic  DP 120 biphasic DP 86 monophasic  AT   AT    PT 123 biphasic PT 98 Irregular  monophasic  PER   PER    GREAT TOE 92 normal GREAT TOE 43 abnormal    RIGHT LEFT  ABI 1.19 0.95   Post-op ABI as above.  - Coumadin load with Lovenox bridge in place   Adele Barthel, MD, [facility] [place] Office: [phone] Pager: [phone]  02/27/2018, 2:06 PM

## 2018-02-27 NOTE — Progress Notes (Signed)
VASCULAR LAB PRELIMINARY  ARTERIAL  ABI completed:    RIGHT    LEFT    PRESSURE WAVEFORM  PRESSURE WAVEFORM  BRACHIAL 103 triphasic BRACHIAL 103 triphasic  DP 120 biphasic DP 86 monophasic  AT   AT    PT 123 biphasic PT 98 Irregular  monophasic  PER   PER    GREAT TOE 92 normal GREAT TOE 43 abnormal    RIGHT LEFT  ABI 1.19 0.95     HONGYING  Keryl Gholson, RVT 02/27/2018, 1:09 PM

## 2018-02-27 NOTE — Evaluation (Signed)
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 PMH - polysubstance abuse, dm, anxiety, thromboembolism of LLE 02/2017   Clinical Impression   PTA, pt was living with family and was independent. Currently, pt performing ADLs and functional mobility at Mod I level. Providing education on compensatory techniques for LB ADLs; pt verbalized and demonstrated understanding. Recommend dc home once medically stable per physician. All acute OT needs met and will sign off.     Follow Up Recommendations  No OT follow up;Supervision - Intermittent    Equipment Recommendations  None recommended by OT    Recommendations for Other Services       Precautions / Restrictions Precautions Precautions: None Restrictions Weight Bearing Restrictions: No LLE Weight Bearing: Weight bearing as tolerated      Mobility Bed Mobility Overal bed mobility: Modified Independent                Transfers Overall transfer level: Modified independent Equipment used: None             General transfer comment: Increased time    Balance Overall balance assessment: No apparent balance deficits (not formally assessed)                                         ADL either performed or assessed with clinical judgement   ADL Overall ADL's : Modified independent                                       General ADL Comments: Pt performing ADLs and functional mobility at Mod I level with increased time. Educating pt on compensatory techniques for LB dressing (including placing LLE on EOB for donning socks). Reviewed fall prevention and home safety. Pt very thanksful for information.      Vision         Perception     Praxis      Pertinent Vitals/Pain  Pain Assessment: Faces Faces Pain Scale: Hurts a little bit Pain Location: LLE Pain Descriptors / Indicators: Sore Pain Intervention(s): Monitored during session;Repositioned     Hand Dominance Right   Extremity/Trunk Assessment Upper Extremity Assessment Upper Extremity Assessment: Overall WFL for tasks assessed   Lower Extremity Assessment Lower Extremity Assessment: Overall WFL for tasks assessed;LLE deficits/detail LLE Deficits / Details: pain at surgical sight near left groin. limited hip flexion for bending forward   Cervical / Trunk Assessment Cervical / Trunk Assessment: Other exceptions Cervical / Trunk Exceptions: increased body habitus   Communication Communication Communication: No difficulties   Cognition Arousal/Alertness: Awake/alert Behavior During Therapy: WFL for tasks assessed/performed Overall Cognitive Status: Within Functional Limits for tasks assessed                                     General Comments       Exercises     Shoulder Instructions      Home Living Family/patient expects to be discharged to:: Private residence Living Arrangements: Children Available Help at Discharge: Family;Available 24 hours/day Type of Home: Mobile home         Home Layout: One level     Bathroom Shower/Tub: Tub/shower unit;Walk-in shower   Bathroom Toilet: Standard     Home Equipment: Cane - single point   Additional Comments: Pt in process of moving into a new home      Prior Functioning/Environment Level of Independence: Independent                 OT Problem List: Decreased activity tolerance;Impaired balance (sitting and/or standing);Pain      OT Treatment/Interventions:      OT Goals(Current goals can be found in the care plan section) Acute Rehab OT Goals Patient Stated Goal: Go home OT Goal Formulation: All assessment and education complete, DC therapy  OT Frequency:     Barriers to D/C:             Co-evaluation              AM-PAC PT "6 Clicks" Daily Activity     Outcome Measure Help from another person eating meals?: None Help from another person taking care of personal grooming?: None Help from another person toileting, which includes using toliet, bedpan, or urinal?: None Help from another person bathing (including washing, rinsing, drying)?: None Help from another person to put on and taking off regular upper body clothing?: None Help from another person to put on and taking off regular lower body clothing?: None 6 Click Score: 24   End of Session Nurse Communication: Mobility status  Activity Tolerance: Patient tolerated treatment well Patient left: in bed;with call bell/phone within reach  OT Visit Diagnosis: Other abnormalities of gait and mobility (R26.89);Muscle weakness (generalized) (M62.81);Pain Pain - Right/Left: Left Pain - part of body: Leg                Time: 1114-1125 OT Time Calculation (min): 11 min Charges:  OT General Charges $OT Visit: 1 Visit OT Evaluation $OT Eval Low Complexity: 1 Low G-Codes:     Charis Capehart MSOT, OTR/L Acute Rehab Pager: [phone] Office: [phone]  Charis M Capehart 02/27/2018, 12:43 PM 

## 2018-02-28 LAB — GLUCOSE, CAPILLARY
Glucose-Capillary: 230 mg/dL — ABNORMAL HIGH (ref 65–99)
Glucose-Capillary: 214 mg/dL — ABNORMAL HIGH (ref 65–99)
Glucose-Capillary: 191 mg/dL — ABNORMAL HIGH (ref 65–99)
Glucose-Capillary: 173 mg/dL — ABNORMAL HIGH (ref 65–99)

## 2018-02-28 LAB — CBC
HCT: 29.1 % — ABNORMAL LOW (ref 36.0–46.0)
Hemoglobin: 8.6 g/dL — ABNORMAL LOW (ref 12.0–15.0)
MCH: 23.2 pg — ABNORMAL LOW (ref 26.0–34.0)
MCHC: 29.6 g/dL — ABNORMAL LOW (ref 30.0–36.0)
MCV: 78.6 fL (ref 78.0–100.0)
Platelets: 402 10*3/uL — ABNORMAL HIGH (ref 150–400)
RBC: 3.7 MIL/uL — ABNORMAL LOW (ref 3.87–5.11)
RDW: 17.1 % — ABNORMAL HIGH (ref 11.5–15.5)
WBC: 11.8 10*3/uL — ABNORMAL HIGH (ref 4.0–10.5)

## 2018-02-28 LAB — PROTIME-INR
INR: 1.08
Prothrombin Time: 13.9 seconds (ref 11.4–15.2)

## 2018-02-28 MED ORDER — WARFARIN SODIUM 10 MG PO TABS
10.0000 mg | ORAL_TABLET | Freq: Once | ORAL | Status: AC
  Administered 2018-02-28: 10 mg via ORAL
  Filled 2018-02-28: qty 1

## 2018-02-28 NOTE — Progress Notes (Signed)
Pt in bed ao x4. complained she has not been feeling good and sick to her stomach, stated " I think I got stomach bug". Earlier vomited x 1, approx 400 cc.  Had BM this am. Prn medication for indigestion and nausea administered. Pt still complaining of 10/10 pain at left groin surgical site. Stated she has low pain threshold. Call light within reach. See MAR for prn medication administration. VSS. Will continue to monitor.

## 2018-02-28 NOTE — Progress Notes (Signed)
[place] for Lovenox/ Warfarin Indication: LLE ischemia/thrombosis  Allergies  Allergen Reactions  . Ciprofloxacin Hcl Hives  . Macrobid [Nitrofurantoin Macrocrystal] Hives, Shortness Of Breath and Rash  . Other Hives, Shortness Of Breath and Rash    NO "-CILLINS"!!!  . Penicillins Shortness Of Breath    Had had cephalosporins without incident  . Sulfa Antibiotics Hives, Shortness Of Breath and Rash  . Lexapro [Escitalopram Oxalate]     I just did not like it.    Patient Measurements: Height: 5' 7"  (170.2 cm) Weight: 252 lb (114.3 kg) IBW/kg (Calculated) : 61.6 Heparin Dosing Weight: 90 kg  Vital Signs: Temp: 97.7 F (36.5 C) (05/28 0852) Temp Source: Oral (05/28 0852) BP: 113/76 (05/28 0852) Pulse Rate: 108 (05/28 0852)  Labs: Recent Labs    02/25/18 1254 02/25/18 2305 02/26/18 0048 02/26/18 0255 02/26/18 2207 02/27/18 0257 02/27/18 0926 02/28/18 0630  HGB  --   --   --   --  9.0* 8.6*  --  8.6*  HCT  --   --   --   --  30.4* 29.6*  --  29.1*  PLT  --   --   --   --  387 370  --  402*  LABPROT  --   --   --   --   --   --  12.6 13.9  INR  --   --   --   --   --   --  0.95 1.08  HEPARINUNFRC 0.72* 0.49  --  0.38  --   --   --   --   CREATININE  --   --  1.33*  --   --  1.35*  --   --    Estimated Creatinine Clearance: 70.2 mL/min (A) (by C-G formula based on SCr of 1.35 mg/dL (H)).  Medical History: Past Medical History:  Diagnosis Date  . Anxiety   . Diabetes ([place])   . GERD (gastroesophageal reflux disease)   . History of blood clots   . Hypercalcemia   . Hyperparathyroidism   . Nephrolithiasis   . Renal calculi    Medications:  No current facility-administered medications on file prior to encounter.    Current Outpatient Medications on File Prior to Encounter  Medication Sig Dispense Refill  . acetaminophen (TYLENOL) 500 MG tablet Take 1,000 mg by mouth every 6 (six) hours as needed for moderate pain.    .  naproxen sodium (ALEVE) 220 MG tablet Take 220 mg by mouth daily as needed (pain).    . blood glucose meter kit and supplies Dispense based on patient and insurance preference. Use up to four times daily as directed. (FOR ICD-9 250.00, 250.01). 1 each 0  . Blood Glucose Monitoring Suppl (TRUE METRIX METER) w/Device KIT Use as directed 1 kit 0  . buPROPion (BUPROBAN) 150 MG 12 hr tablet Take 1 tablet (150 mg total) by mouth 2 (two) times daily. (Patient not taking: Reported on 02/25/2018) 60 tablet 2  . clopidogrel (PLAVIX) 75 MG tablet Take 1 tablet (75 mg total) by mouth daily. (Patient not taking: Reported on 02/25/2018) 30 tablet 10  . ferrous sulfate 325 (65 FE) MG tablet Take 1 tablet (325 mg total) by mouth daily with breakfast. (Patient not taking: Reported on 02/25/2018) 100 tablet 1  . gabapentin (NEURONTIN) 300 MG capsule Take 1 capsule (300 mg total) by mouth 2 (two) times daily. (Patient not taking: Reported on 02/25/2018) 60  capsule 3  . glucose blood (TRUE METRIX BLOOD GLUCOSE TEST) test strip Use as instructed 100 each 12  . glucose blood test strip Use as instructed 100 each 0  . hydrOXYzine (ATARAX/VISTARIL) 25 MG tablet Take 2 tablets (50 mg total) by mouth at bedtime. TAKE 1 TABLET BY MOUTH AT BEDTIME AS NEEDED FOR ANXIETY. (Patient not taking: Reported on 02/25/2018) 60 tablet 2  . insulin aspart (NOVOLOG) 100 UNIT/ML injection 5-15 units subcut TID with meals (Patient not taking: Reported on 02/25/2018) 10 mL 11  . insulin glargine (LANTUS) 100 UNIT/ML injection Inject 0.7 mLs (70 Units total) into the skin daily. (Patient not taking: Reported on 02/25/2018) 10 mL 11  . Insulin Pen Needle 31G X 5 MM MISC Use with Lantus and Novolog injections. 100 each 11  . Insulin Syringe-Needle U-100 (BD INSULIN SYRINGE ULTRAFINE) 31G X 5/16" 0.5 ML MISC Use as directed 100 each 3  . lisinopril (PRINIVIL,ZESTRIL) 5 MG tablet Take 0.5 tablets (2.5 mg total) by mouth daily. (Patient not taking: Reported  on 02/25/2018) 45 tablet 3  . nicotine (NICODERM CQ - DOSED IN MG/24 HOURS) 14 mg/24hr patch Place 1 patch (14 mg total) onto the skin daily. (Patient not taking: Reported on 02/25/2018) 28 patch 1  . omeprazole (PRILOSEC) 20 MG capsule Take 2 capsules (40 mg total) by mouth daily. (Patient not taking: Reported on 02/25/2018) 60 capsule 3  . sitaGLIPtin (JANUVIA) 50 MG tablet Take 1 tablet (50 mg total) by mouth daily. (Patient not taking: Reported on 02/25/2018) 30 tablet 3  . traZODone (DESYREL) 50 MG tablet Take 0.5 tablets (25 mg total) by mouth at bedtime as needed for sleep. (Patient not taking: Reported on 02/25/2018) 30 tablet 3  . TRUEPLUS LANCETS 28G MISC Use as directed 100 each 6    Assessment: 32 yof with L iliac and popliteal thrombus now s/p OR 5/25 for thromboembolectomy. Pharmacy consulted to transition patient from heparin infusion to warfarin with Lovenox bridge. HgB down yesterday to 8.6 but stable today, plt stable - no overt bleeding reported. INR up to 1.08 after 2 doses of warfarin. SCr 1.35 stable, CrCl>30.  Goal of Therapy:  INR 2-3 Monitor platelets by anticoagulation protocol: Yes   Plan:  Warfarin 74m x1 tonight Lovenox 1148m(~1 mg/kg) [place] Q12h Monitor daily INR, CBC at least q72h on Lovenox, SCr, for s/sx bleeding  HaElicia LampPharmD, BCPS Clinical Pharmacist Clinical phone for 02/28/2018 until 3:30pm: [phone] after 3:30pm, please call main pharmacy at: [phone] 02/28/2018 9:45 AM

## 2018-02-28 NOTE — Progress Notes (Signed)
  Progress Note    02/28/2018 9:53 AM 3 Days Post-Op  Subjective: Left lateral leg has some persistent numbness but overall her foot feels fine  Vitals:   02/28/18 0439 02/28/18 0852  BP: 118/79 113/76  Pulse: (!) 114 (!) 108  Resp: 18 20  Temp: 98.4 F (36.9 C) 97.7 F (36.5 C)  SpO2: 97%     Physical Exam: Awake alert and oriented Abdomen soft Left groin incision is clean dry and intact with staples Palpable dorsalis pedis pulse on the left Her calf muscle is soft and her foot is sensorimotor intact  CBC    Component Value Date/Time   WBC 11.8 (H) 02/28/2018 0630   RBC 3.70 (L) 02/28/2018 0630   HGB 8.6 (L) 02/28/2018 0630   HGB 11.0 (L) 01/27/2018 1157   HCT 29.1 (L) 02/28/2018 0630   HCT 37.6 01/27/2018 1157   PLT 402 (H) 02/28/2018 0630   PLT 490 (H) 01/27/2018 1157   MCV 78.6 02/28/2018 0630   MCV 78 (L) 01/27/2018 1157   MCH 23.2 (L) 02/28/2018 0630   MCHC 29.6 (L) 02/28/2018 0630   RDW 17.1 (H) 02/28/2018 0630   RDW 17.1 (H) 01/27/2018 1157   LYMPHSABS 3.8 02/25/2018 0130   MONOABS 1.0 02/25/2018 0130   EOSABS 0.3 02/25/2018 0130   BASOSABS 0.1 02/25/2018 0130    BMET    Component Value Date/Time   NA 133 (L) 02/27/2018 0257   NA 134 01/27/2018 1157   K 4.3 02/27/2018 0257   CL 102 02/27/2018 0257   CO2 23 02/27/2018 0257   GLUCOSE 278 (H) 02/27/2018 0257   BUN 9 02/27/2018 0257   BUN 14 01/27/2018 1157   CREATININE 1.35 (H) 02/27/2018 0257   CALCIUM 8.4 (L) 02/27/2018 0257   GFRNONAA 47 (L) 02/27/2018 0257   GFRAA 55 (L) 02/27/2018 0257    INR    Component Value Date/Time   INR 1.08 02/28/2018 0630     Intake/Output Summary (Last 24 hours) at 02/28/2018 0953 Last data filed at 02/28/2018 0300 Gross per 24 hour  Intake 2860 ml  Output 430 ml  Net 2430 ml     Assessment:  42 y.o. female is s/p left lower extremity thromboembolectomy for acute limb ischemia now transitioning to Coumadin.  Plan: Continue Lovenox bridge and daily  INR checks Regular diet Out of bed as tolerated    Ludean Duhart C. Donzetta Matters, MD [facility] Office: [phone] Pager: [phone]  02/28/2018 9:53 AM

## 2018-03-01 LAB — TYPE AND SCREEN
ABO/RH(D): O NEG
Antibody Screen: NEGATIVE
Unit division: 0
Unit division: 0
Unit division: 0
Unit division: 0

## 2018-03-01 LAB — CBC
HCT: 29.6 % — ABNORMAL LOW (ref 36.0–46.0)
Hemoglobin: 8.7 g/dL — ABNORMAL LOW (ref 12.0–15.0)
MCH: 23.4 pg — ABNORMAL LOW (ref 26.0–34.0)
MCHC: 29.4 g/dL — ABNORMAL LOW (ref 30.0–36.0)
MCV: 79.6 fL (ref 78.0–100.0)
Platelets: 450 10*3/uL — ABNORMAL HIGH (ref 150–400)
RBC: 3.72 MIL/uL — ABNORMAL LOW (ref 3.87–5.11)
RDW: 17.4 % — ABNORMAL HIGH (ref 11.5–15.5)
WBC: 11.6 10*3/uL — ABNORMAL HIGH (ref 4.0–10.5)

## 2018-03-01 LAB — GLUCOSE, CAPILLARY
Glucose-Capillary: 135 mg/dL — ABNORMAL HIGH (ref 65–99)
Glucose-Capillary: 121 mg/dL — ABNORMAL HIGH (ref 65–99)
Glucose-Capillary: 145 mg/dL — ABNORMAL HIGH (ref 65–99)
Glucose-Capillary: 160 mg/dL — ABNORMAL HIGH (ref 65–99)
Glucose-Capillary: 157 mg/dL — ABNORMAL HIGH (ref 65–99)

## 2018-03-01 LAB — BPAM RBC
Blood Product Expiration Date: 201906122359
Blood Product Expiration Date: 201906132359
Blood Product Expiration Date: 201906152359
Blood Product Expiration Date: 201906172359
ISSUE DATE / TIME: 201905250932
ISSUE DATE / TIME: 201905250932
Unit Type and Rh: 9500
Unit Type and Rh: 9500
Unit Type and Rh: 9500
Unit Type and Rh: 9500

## 2018-03-01 LAB — PROTIME-INR
INR: 1.15
Prothrombin Time: 14.6 seconds (ref 11.4–15.2)

## 2018-03-01 MED ORDER — WARFARIN SODIUM 2.5 MG PO TABS
12.5000 mg | ORAL_TABLET | Freq: Once | ORAL | Status: AC
  Administered 2018-03-01: 12.5 mg via ORAL
  Filled 2018-03-01: qty 1

## 2018-03-01 MED ORDER — CEPHALEXIN 500 MG PO CAPS
500.0000 mg | ORAL_CAPSULE | Freq: Four times a day (QID) | ORAL | Status: DC
  Administered 2018-03-01 – 2018-03-07 (×24): 500 mg via ORAL
  Filled 2018-03-01 (×24): qty 1

## 2018-03-01 NOTE — Progress Notes (Signed)
[place] for Lovenox/ Warfarin Indication: LLE ischemia/thrombosis  Allergies  Allergen Reactions  . Ciprofloxacin Hcl Hives  . Macrobid [Nitrofurantoin Macrocrystal] Hives, Shortness Of Breath and Rash  . Other Hives, Shortness Of Breath and Rash    NO "-CILLINS"!!!  . Penicillins Shortness Of Breath    Had had cephalosporins without incident  . Sulfa Antibiotics Hives, Shortness Of Breath and Rash  . Lexapro [Escitalopram Oxalate]     I just did not like it.    Patient Measurements: Height: '5\' 7"'$  (170.2 cm) Weight: 252 lb (114.3 kg) IBW/kg (Calculated) : 61.6 Heparin Dosing Weight: 90 kg  Vital Signs: Temp: 98.4 F (36.9 C) (05/29 0501) Temp Source: Oral (05/29 0501) BP: 97/66 (05/29 0504) Pulse Rate: 89 (05/29 0504)  Labs: Recent Labs    02/27/18 0257 02/27/18 0926 02/28/18 0630 03/01/18 0439  HGB 8.6*  --  8.6* 8.7*  HCT 29.6*  --  29.1* 29.6*  PLT 370  --  402* 450*  LABPROT  --  12.6 13.9 14.6  INR  --  0.95 1.08 1.15  CREATININE 1.35*  --   --   --    Estimated Creatinine Clearance: 70.2 mL/min (A) (by C-G formula based on SCr of 1.35 mg/dL (H)).  Medical History: Past Medical History:  Diagnosis Date  . Anxiety   . Diabetes ([place])   . GERD (gastroesophageal reflux disease)   . History of blood clots   . Hypercalcemia   . Hyperparathyroidism   . Nephrolithiasis   . Renal calculi    Medications:  No current facility-administered medications on file prior to encounter.    Current Outpatient Medications on File Prior to Encounter  Medication Sig Dispense Refill  . acetaminophen (TYLENOL) 500 MG tablet Take 1,000 mg by mouth every 6 (six) hours as needed for moderate pain.    . naproxen sodium (ALEVE) 220 MG tablet Take 220 mg by mouth daily as needed (pain).    . blood glucose meter kit and supplies Dispense based on patient and insurance preference. Use up to four times daily as directed. (FOR ICD-9 250.00,  250.01). 1 each 0  . Blood Glucose Monitoring Suppl (TRUE METRIX METER) w/Device KIT Use as directed 1 kit 0  . buPROPion (BUPROBAN) 150 MG 12 hr tablet Take 1 tablet (150 mg total) by mouth 2 (two) times daily. (Patient not taking: Reported on 02/25/2018) 60 tablet 2  . clopidogrel (PLAVIX) 75 MG tablet Take 1 tablet (75 mg total) by mouth daily. (Patient not taking: Reported on 02/25/2018) 30 tablet 10  . ferrous sulfate 325 (65 FE) MG tablet Take 1 tablet (325 mg total) by mouth daily with breakfast. (Patient not taking: Reported on 02/25/2018) 100 tablet 1  . gabapentin (NEURONTIN) 300 MG capsule Take 1 capsule (300 mg total) by mouth 2 (two) times daily. (Patient not taking: Reported on 02/25/2018) 60 capsule 3  . glucose blood (TRUE METRIX BLOOD GLUCOSE TEST) test strip Use as instructed 100 each 12  . glucose blood test strip Use as instructed 100 each 0  . hydrOXYzine (ATARAX/VISTARIL) 25 MG tablet Take 2 tablets (50 mg total) by mouth at bedtime. TAKE 1 TABLET BY MOUTH AT BEDTIME AS NEEDED FOR ANXIETY. (Patient not taking: Reported on 02/25/2018) 60 tablet 2  . insulin aspart (NOVOLOG) 100 UNIT/ML injection 5-15 units subcut TID with meals (Patient not taking: Reported on 02/25/2018) 10 mL 11  . insulin glargine (LANTUS) 100 UNIT/ML injection Inject 0.7 mLs (  70 Units total) into the skin daily. (Patient not taking: Reported on 02/25/2018) 10 mL 11  . Insulin Pen Needle 31G X 5 MM MISC Use with Lantus and Novolog injections. 100 each 11  . Insulin Syringe-Needle U-100 (BD INSULIN SYRINGE ULTRAFINE) 31G X 5/16" 0.5 ML MISC Use as directed 100 each 3  . lisinopril (PRINIVIL,ZESTRIL) 5 MG tablet Take 0.5 tablets (2.5 mg total) by mouth daily. (Patient not taking: Reported on 02/25/2018) 45 tablet 3  . nicotine (NICODERM CQ - DOSED IN MG/24 HOURS) 14 mg/24hr patch Place 1 patch (14 mg total) onto the skin daily. (Patient not taking: Reported on 02/25/2018) 28 patch 1  . omeprazole (PRILOSEC) 20 MG capsule  Take 2 capsules (40 mg total) by mouth daily. (Patient not taking: Reported on 02/25/2018) 60 capsule 3  . sitaGLIPtin (JANUVIA) 50 MG tablet Take 1 tablet (50 mg total) by mouth daily. (Patient not taking: Reported on 02/25/2018) 30 tablet 3  . traZODone (DESYREL) 50 MG tablet Take 0.5 tablets (25 mg total) by mouth at bedtime as needed for sleep. (Patient not taking: Reported on 02/25/2018) 30 tablet 3  . TRUEPLUS LANCETS 28G MISC Use as directed 100 each 6    Assessment: 59 yof with L iliac and popliteal thrombus now s/p OR 5/25 for thromboembolectomy. Pharmacy consulted to transition patient from heparin infusion to warfarin with Lovenox bridge. HgB low but stable, plt stable - no overt bleeding reported. INR with very slow rise, up to 1.15 after 3 doses of warfarin. SCr 1.35 stable, CrCl>30.  Goal of Therapy:  INR 2-3 Monitor platelets by anticoagulation protocol: Yes   Plan:  Warfarin 12.70m x1 tonight Lovenox 1112m(~1 mg/kg) [place] Q12h Monitor daily INR, CBC at least q72h on Lovenox, SCr, for s/sx bleeding  HaElicia LampPharmD, BCPS Clinical Pharmacist Clinical phone for 03/01/2018 until 3:30pm: [phone] after 3:30pm, please call main pharmacy at: [phone] 03/01/2018 8:28 AM

## 2018-03-01 NOTE — Clinical Social Work Note (Signed)
Clinical Social Work Assessment  Patient Details  Name: Terri Sparks MRN: [id number] Date of Birth: Apr 04, 1976  Date of referral:  03/01/18               Reason for consult:  Substance Use/ETOH Abuse                Permission sought to share information with:    Permission granted to share information::     Name::        Agency::     Relationship::     Contact Information:     Housing/Transportation Living arrangements for the past 2 months:  Single Family Home Source of Information:  Patient Patient Interpreter Needed:  None Criminal Activity/Legal Involvement Pertinent to Current Situation/Hospitalization:  No - Comment as needed Significant Relationships:  Adult Children, Significant Other, Other Family Members Lives with:  Significant Other Do you feel safe going back to the place where you live?  Yes Need for family participation in patient care:  No (Coment)  Care giving concerns: Patient from home. CSW consulted for substance use. Patient tested positive for multiple substances.  Social Worker assessment / plan: CSW met with patient at bedside. Patient alert and oriented, walking around in room. Patient with pleasant affect. CSW introduced self and role and assessed substance use. CSW informed patient about the substances she tested positive for. Patient endorsed using opiates, benzodiazepines, and marijuana; denied using cocaine.  Patient discussed reasons for using the substances, citing social anxiety and pain that wasn't relieved by OTC pain medications. CSW provided education on risks of using non-prescribed medications, including risks of overdose with opiates. Patient accepting of information.  CSW provided reflective listening and supportive intervention. CSW did provide patient with list of inpatient and outpatient substance use treatment resources. CSW also provided patient with information on [facility] [place] for counseling.  Patient requested  assistance with resources for food; CSW provided list of food pantries and free meals in town.  CSW signing off, as no additional needs identified.  Employment status:  Disabled (Comment on whether or not currently receiving Disability) Insurance information:  Self Pay (Medicaid Pending) PT Recommendations:  No Follow Up Information / Referral to community resources:  Outpatient Substance Abuse Treatment Options, Residential Substance Abuse Treatment Options, [facility] [place], Other (Comment Required)(food pantries)  Patient/Family's Response to care: Patient appreciative of resources.  Patient/Family's Understanding of and Emotional Response to Diagnosis, Current Treatment, and Prognosis: Patient with understanding of medical condition and risks of substance use.   Emotional Assessment Appearance:  Appears stated age Attitude/Demeanor/Rapport:  Engaged, Gracious Affect (typically observed):  Appropriate, Accepting, Calm, Pleasant Orientation:  Oriented to Self, Oriented to Place, Oriented to  Time, Oriented to Situation Alcohol / Substance use:  Illicit Drugs Psych involvement (Current and /or in the community):  No (Comment)  Discharge Needs  Concerns to be addressed:  Substance Abuse Concerns Readmission within the last 30 days:  No Current discharge risk:  Substance Abuse Barriers to Discharge:  Continued Medical Work up   Estanislado Emms, LCSW 03/01/2018, 12:27 PM

## 2018-03-01 NOTE — Progress Notes (Addendum)
  Progress Note    03/01/2018 7:16 AM 4 Days Post-Op  Subjective:  C/o pain in her left groin  Afebrile HR  90's-110's  72'Z-366'Y systolic 40% RA  Vitals:   03/01/18 0501 03/01/18 0504  BP: (!) 76/67 97/66  Pulse: 89 89  Resp: 13 17  Temp: 98.4 F (36.9 C)   SpO2: 96% 93%    Physical Exam: Cardiac:  regular Lungs:  Non labored Incisions:  Mild erythema around left groin incision with moderate serous drainage. Extremities:  Motor and sensory are in tact left foot; left foot warm and well perfused.  Left calf and anterior compartments are soft and non tender.   CBC    Component Value Date/Time   WBC 11.6 (H) 03/01/2018 0439   RBC 3.72 (L) 03/01/2018 0439   HGB 8.7 (L) 03/01/2018 0439   HGB 11.0 (L) 01/27/2018 1157   HCT 29.6 (L) 03/01/2018 0439   HCT 37.6 01/27/2018 1157   PLT 450 (H) 03/01/2018 0439   PLT 490 (H) 01/27/2018 1157   MCV 79.6 03/01/2018 0439   MCV 78 (L) 01/27/2018 1157   MCH 23.4 (L) 03/01/2018 0439   MCHC 29.4 (L) 03/01/2018 0439   RDW 17.4 (H) 03/01/2018 0439   RDW 17.1 (H) 01/27/2018 1157   LYMPHSABS 3.8 02/25/2018 0130   MONOABS 1.0 02/25/2018 0130   EOSABS 0.3 02/25/2018 0130   BASOSABS 0.1 02/25/2018 0130    BMET    Component Value Date/Time   NA 133 (L) 02/27/2018 0257   NA 134 01/27/2018 1157   K 4.3 02/27/2018 0257   CL 102 02/27/2018 0257   CO2 23 02/27/2018 0257   GLUCOSE 278 (H) 02/27/2018 0257   BUN 9 02/27/2018 0257   BUN 14 01/27/2018 1157   CREATININE 1.35 (H) 02/27/2018 0257   CALCIUM 8.4 (L) 02/27/2018 0257   GFRNONAA 47 (L) 02/27/2018 0257   GFRAA 55 (L) 02/27/2018 0257    INR    Component Value Date/Time   INR 1.15 03/01/2018 0439     Intake/Output Summary (Last 24 hours) at 03/01/2018 0716 Last data filed at 02/28/2018 2300 Gross per 24 hour  Intake 1080 ml  Output -  Net 1080 ml     Assessment:  42 y.o. female is s/p:   left lower extremity thromboembolectomy for acute limb ischemia now  transitioning to Coumadin.  4 Days Post-Op  Plan: -motor/sensory in tact left foot; calf is soft -moderate serous drainage left groin with mild erythema-may need to remove a couple of staples.  May need some abx.  Still with mild leukocytosis but stable from yesterday and not increasing.  Will d/w Dr. Donzetta Matters.  Pt has dry gauze in left groin and more in the room to change as needed.   -DVT prophylaxis:  Lovenox to coumadin bridge.  INR is only 1.15 today -ambulate   Leontine Locket, PA-C [facility] [phone] 03/01/2018 7:16 AM  I have independently interviewed and examined patient and agree with PA assessment and plan above.  Left groin has mild erythema and pain.  Will add Keflex although patient has multiple antibiotic allergies she states she can take Keflex.  Continue Lovenox bridge to Coumadin.  Out of bed as tolerated.  Roderick Calo C. Donzetta Matters, MD [facility] [place] Office: [phone] Pager: [phone]

## 2018-03-01 NOTE — Plan of Care (Signed)
  Problem: Pain Managment: Goal: General experience of comfort will improve Outcome: Not Progressing  Patient reporting pain in left leg at 10/10. Being given oxycodone (10mg ) with acetaminophen (650mg ). Pain goes down to 8/10 but returns again to 10/10. Pain medication being given on regular schedule with morphine (2mg ) for breakthrough pain.

## 2018-03-02 LAB — GLUCOSE, CAPILLARY
Glucose-Capillary: 118 mg/dL — ABNORMAL HIGH (ref 65–99)
Glucose-Capillary: 173 mg/dL — ABNORMAL HIGH (ref 65–99)
Glucose-Capillary: 105 mg/dL — ABNORMAL HIGH (ref 65–99)
Glucose-Capillary: 122 mg/dL — ABNORMAL HIGH (ref 65–99)

## 2018-03-02 LAB — CBC
HCT: 30.8 % — ABNORMAL LOW (ref 36.0–46.0)
Hemoglobin: 9 g/dL — ABNORMAL LOW (ref 12.0–15.0)
MCH: 23.2 pg — ABNORMAL LOW (ref 26.0–34.0)
MCHC: 29.2 g/dL — ABNORMAL LOW (ref 30.0–36.0)
MCV: 79.4 fL (ref 78.0–100.0)
Platelets: 486 10*3/uL — ABNORMAL HIGH (ref 150–400)
RBC: 3.88 MIL/uL (ref 3.87–5.11)
RDW: 17.2 % — ABNORMAL HIGH (ref 11.5–15.5)
WBC: 13.1 10*3/uL — ABNORMAL HIGH (ref 4.0–10.5)

## 2018-03-02 LAB — PROTIME-INR
INR: 1.4
Prothrombin Time: 17 seconds — ABNORMAL HIGH (ref 11.4–15.2)

## 2018-03-02 MED ORDER — CALCIUM CARBONATE ANTACID 500 MG PO CHEW
1.0000 | CHEWABLE_TABLET | Freq: Three times a day (TID) | ORAL | Status: DC
  Administered 2018-03-02 – 2018-03-07 (×14): 200 mg via ORAL
  Filled 2018-03-02 (×14): qty 1

## 2018-03-02 MED ORDER — NYSTATIN 100000 UNIT/ML MT SUSP
5.0000 mL | Freq: Four times a day (QID) | OROMUCOSAL | Status: DC
  Administered 2018-03-02 – 2018-03-06 (×13): 500000 [IU] via ORAL
  Administered 2018-03-06: 100000 [IU] via ORAL
  Administered 2018-03-06 – 2018-03-07 (×5): 500000 [IU] via ORAL
  Filled 2018-03-02 (×19): qty 5

## 2018-03-02 MED ORDER — WARFARIN SODIUM 2.5 MG PO TABS
12.5000 mg | ORAL_TABLET | Freq: Once | ORAL | Status: AC
  Administered 2018-03-02: 12.5 mg via ORAL
  Filled 2018-03-02: qty 1

## 2018-03-02 NOTE — Progress Notes (Addendum)
[place] for Lovenox/Warfarin Indication: LLE ischemia/thrombosis  Allergies  Allergen Reactions  . Ciprofloxacin Hcl Hives  . Macrobid [Nitrofurantoin Macrocrystal] Hives, Shortness Of Breath and Rash  . Other Hives, Shortness Of Breath and Rash    NO "-CILLINS"!!!  . Penicillins Shortness Of Breath    Had had cephalosporins without incident  . Sulfa Antibiotics Hives, Shortness Of Breath and Rash  . Lexapro [Escitalopram Oxalate]     I just did not like it.    Patient Measurements: Height: _0  (170.2 cm) Weight: 252 lb (114.3 kg) IBW/kg (Calculated) : 61.6 Heparin Dosing Weight: 90 kg  Vital Signs: Temp: 99.3 F (37.4 C) (05/30 0347) Temp Source: Oral (05/30 0347) BP: 138/74 (05/30 0347) Pulse Rate: 97 (05/30 0347)  Labs: Recent Labs    02/28/18 0630 03/01/18 0439 03/02/18 0335  HGB 8.6* 8.7* 9.0*  HCT 29.1* 29.6* 30.8*  PLT 402* 450* 486*  LABPROT 13.9 14.6 17.0*  INR 1.08 1.15 1.40   Estimated Creatinine Clearance: 70.2 mL/min (A) (by C-G formula based on SCr of 1.35 mg/dL (H)).  Medical History: Past Medical History:  Diagnosis Date  . Anxiety   . Diabetes ([place])   . GERD (gastroesophageal reflux disease)   . History of blood clots   . Hypercalcemia   . Hyperparathyroidism   . Nephrolithiasis   . Renal calculi    Medications:  No current facility-administered medications on file prior to encounter.    Current Outpatient Medications on File Prior to Encounter  Medication Sig Dispense Refill  . acetaminophen (TYLENOL) 500 MG tablet Take 1,000 mg by mouth every 6 (six) hours as needed for moderate pain.    . naproxen sodium (ALEVE) 220 MG tablet Take 220 mg by mouth daily as needed (pain).    . blood glucose meter kit and supplies Dispense based on patient and insurance preference. Use up to four times daily as directed. (FOR ICD-9 250.00, 250.01). 1 each 0  . Blood Glucose Monitoring Suppl (TRUE METRIX METER)  w/Device KIT Use as directed 1 kit 0  . buPROPion (BUPROBAN) 150 MG 12 hr tablet Take 1 tablet (150 mg total) by mouth 2 (two) times daily. (Patient not taking: Reported on 02/25/2018) 60 tablet 2  . clopidogrel (PLAVIX) 75 MG tablet Take 1 tablet (75 mg total) by mouth daily. (Patient not taking: Reported on 02/25/2018) 30 tablet 10  . ferrous sulfate 325 (65 FE) MG tablet Take 1 tablet (325 mg total) by mouth daily with breakfast. (Patient not taking: Reported on 02/25/2018) 100 tablet 1  . gabapentin (NEURONTIN) 300 MG capsule Take 1 capsule (300 mg total) by mouth 2 (two) times daily. (Patient not taking: Reported on 02/25/2018) 60 capsule 3  . glucose blood (TRUE METRIX BLOOD GLUCOSE TEST) test strip Use as instructed 100 each 12  . glucose blood test strip Use as instructed 100 each 0  . hydrOXYzine (ATARAX/VISTARIL) 25 MG tablet Take 2 tablets (50 mg total) by mouth at bedtime. TAKE 1 TABLET BY MOUTH AT BEDTIME AS NEEDED FOR ANXIETY. (Patient not taking: Reported on 02/25/2018) 60 tablet 2  . insulin aspart (NOVOLOG) 100 UNIT/ML injection 5-15 units subcut TID with meals (Patient not taking: Reported on 02/25/2018) 10 mL 11  . insulin glargine (LANTUS) 100 UNIT/ML injection Inject 0.7 mLs (70 Units total) into the skin daily. (Patient not taking: Reported on 02/25/2018) 10 mL 11  . Insulin Pen Needle 31G X 5 MM MISC Use with Lantus and  Novolog injections. 100 each 11  . Insulin Syringe-Needle U-100 (BD INSULIN SYRINGE ULTRAFINE) 31G X 5/16" 0.5 ML MISC Use as directed 100 each 3  . lisinopril (PRINIVIL,ZESTRIL) 5 MG tablet Take 0.5 tablets (2.5 mg total) by mouth daily. (Patient not taking: Reported on 02/25/2018) 45 tablet 3  . nicotine (NICODERM CQ - DOSED IN MG/24 HOURS) 14 mg/24hr patch Place 1 patch (14 mg total) onto the skin daily. (Patient not taking: Reported on 02/25/2018) 28 patch 1  . omeprazole (PRILOSEC) 20 MG capsule Take 2 capsules (40 mg total) by mouth daily. (Patient not taking:  Reported on 02/25/2018) 60 capsule 3  . sitaGLIPtin (JANUVIA) 50 MG tablet Take 1 tablet (50 mg total) by mouth daily. (Patient not taking: Reported on 02/25/2018) 30 tablet 3  . traZODone (DESYREL) 50 MG tablet Take 0.5 tablets (25 mg total) by mouth at bedtime as needed for sleep. (Patient not taking: Reported on 02/25/2018) 30 tablet 3  . TRUEPLUS LANCETS 28G MISC Use as directed 100 each 6    Assessment: 14 yof with L iliac and popliteal thrombus now s/p OR 5/25 for thromboembolectomy. Pharmacy consulted to transition patient from heparin infusion to warfarin with Lovenox bridge. CBC stable - no overt bleeding reported. INR with very slow rise but starting to bump, up to 1.4 after 4 doses of warfarin. SCr 1.35 stable, CrCl>30. Noted on Keflex.  Goal of Therapy:  INR 2-3 Monitor platelets by anticoagulation protocol: Yes   Plan:  Warfarin 12.27m x 1 again tonight - may need to reduce tomorrow Lovenox 1173m(~1 mg/kg) [place] Q12h Monitor daily INR, CBC at least q72h on Lovenox, SCr, for s/sx bleeding  HaElicia LampPharmD, BCPS Clinical Pharmacist Clinical phone for 03/02/2018 until 3:30pm: [phone] If after 3:30pm, please call main pharmacy at: [phone] 03/02/2018 8:37 AM

## 2018-03-02 NOTE — Progress Notes (Signed)
  Progress Note    03/02/2018 12:02 PM 5 Days Post-Op  Subjective: No acute issues  Vitals:   03/01/18 2336 03/02/18 0347  BP: 120/76 138/74  Pulse: 96 97  Resp: 15 18  Temp: 99.1 F (37.3 C) 99.3 F (37.4 C)  SpO2: 99% 100%    Physical Exam: Awake alert and oriented Nonlabored respirations Abdomen is soft Left groin without erythema Palpable dorsalis pedis pulse on left   CBC    Component Value Date/Time   WBC 13.1 (H) 03/02/2018 0335   RBC 3.88 03/02/2018 0335   HGB 9.0 (L) 03/02/2018 0335   HGB 11.0 (L) 01/27/2018 1157   HCT 30.8 (L) 03/02/2018 0335   HCT 37.6 01/27/2018 1157   PLT 486 (H) 03/02/2018 0335   PLT 490 (H) 01/27/2018 1157   MCV 79.4 03/02/2018 0335   MCV 78 (L) 01/27/2018 1157   MCH 23.2 (L) 03/02/2018 0335   MCHC 29.2 (L) 03/02/2018 0335   RDW 17.2 (H) 03/02/2018 0335   RDW 17.1 (H) 01/27/2018 1157   LYMPHSABS 3.8 02/25/2018 0130   MONOABS 1.0 02/25/2018 0130   EOSABS 0.3 02/25/2018 0130   BASOSABS 0.1 02/25/2018 0130    BMET    Component Value Date/Time   NA 133 (L) 02/27/2018 0257   NA 134 01/27/2018 1157   K 4.3 02/27/2018 0257   CL 102 02/27/2018 0257   CO2 23 02/27/2018 0257   GLUCOSE 278 (H) 02/27/2018 0257   BUN 9 02/27/2018 0257   BUN 14 01/27/2018 1157   CREATININE 1.35 (H) 02/27/2018 0257   CALCIUM 8.4 (L) 02/27/2018 0257   GFRNONAA 47 (L) 02/27/2018 0257   GFRAA 55 (L) 02/27/2018 0257    INR    Component Value Date/Time   INR 1.40 03/02/2018 0335    No intake or output data in the 24 hours ending 03/02/18 1202   Assessment:  42 y.o. female is left lower extremity thromboembolectomy with patch angioplasty of left SFA and balloon angioplasty left common iliac artery stent Plan: Continue Lovenox bridge to therapeutic INR with Coumadin Continue Plavix Out of bed as tolerated Keflex for previous erythema left groin.   Brandon C. Donzetta Matters, MD [facility] [place] Office: [phone] Pager:  [phone]  03/02/2018 12:02 PM

## 2018-03-02 NOTE — Care Management Note (Signed)
Case Management Note  Patient Details  Name: Terri Sparks MRN: [id number] Date of Birth: 13-Jul-1976  Subjective/Objective:     left lower extremity thromboembolectomy with patch angioplasty of left SFA and balloon angioplasty left common iliac artery stent                Action/Plan: NCM spoke to pt and boyfriend at bedside. Pt has application for [employer] orange card. She goes to the [facility] and has appt on 7/25. Explained she will need to call and get an earlier appt. States she gets her medications at [facility] at discount but she is not sure about new dc meds. Will provided pt with MATCH letter and explained $3 copay and once per year use. States she has applied for disability but was denied. She applied for Food Stamps and waiting to hear back. States Development worker, community has seen while in hospital.   Expected Discharge Date:                  Expected Discharge Plan:  Home/Self Care  In-House Referral:  Clinical Social Work  Discharge planning Services  CM Consult, [place] Program, Medication Assistance  Post Acute Care Choice:  NA Choice offered to:  NA  DME Arranged:  N/A DME Agency:  NA  HH Arranged:  NA HH Agency:  NA  Status of Service:  Completed, signed off  If discussed at [employer], dates discussed:    Additional Comments:  Erenest Rasher, RN 03/02/2018, 8:03 PM

## 2018-03-03 ENCOUNTER — Encounter (HOSPITAL_COMMUNITY): Payer: Self-pay | Admitting: *Deleted

## 2018-03-03 ENCOUNTER — Inpatient Hospital Stay (HOSPITAL_COMMUNITY): Payer: Medicaid Other | Admitting: Anesthesiology

## 2018-03-03 ENCOUNTER — Encounter (HOSPITAL_COMMUNITY)
Admission: EM | Disposition: A | Discharge status: Home Health | Payer: Self-pay | Source: Home / Self Care | Attending: Vascular Surgery

## 2018-03-03 DIAGNOSIS — I9789 Other postprocedural complications and disorders of the circulatory system, not elsewhere classified: Secondary | ICD-10-CM

## 2018-03-03 HISTORY — PX: APPLICATION OF WOUND VAC: SHX5189

## 2018-03-03 HISTORY — PX: WOUND EXPLORATION: SHX6188

## 2018-03-03 HISTORY — PX: WOUND DEBRIDEMENT: SHX247

## 2018-03-03 LAB — GLUCOSE, CAPILLARY
Glucose-Capillary: 92 mg/dL (ref 65–99)
Glucose-Capillary: 107 mg/dL — ABNORMAL HIGH (ref 65–99)
Glucose-Capillary: 114 mg/dL — ABNORMAL HIGH (ref 65–99)
Glucose-Capillary: 116 mg/dL — ABNORMAL HIGH (ref 65–99)
Glucose-Capillary: 302 mg/dL — ABNORMAL HIGH (ref 65–99)
Glucose-Capillary: 254 mg/dL — ABNORMAL HIGH (ref 65–99)

## 2018-03-03 LAB — CBC
HCT: 33.8 % — ABNORMAL LOW (ref 36.0–46.0)
Hemoglobin: 9.8 g/dL — ABNORMAL LOW (ref 12.0–15.0)
MCH: 22.8 pg — ABNORMAL LOW (ref 26.0–34.0)
MCHC: 29 g/dL — ABNORMAL LOW (ref 30.0–36.0)
MCV: 78.8 fL (ref 78.0–100.0)
Platelets: 562 10*3/uL — ABNORMAL HIGH (ref 150–400)
RBC: 4.29 MIL/uL (ref 3.87–5.11)
RDW: 17.3 % — ABNORMAL HIGH (ref 11.5–15.5)
WBC: 15.6 10*3/uL — ABNORMAL HIGH (ref 4.0–10.5)

## 2018-03-03 LAB — PROTIME-INR
INR: 1.8
Prothrombin Time: 20.7 seconds — ABNORMAL HIGH (ref 11.4–15.2)

## 2018-03-03 SURGERY — WOUND EXPLORATION
Anesthesia: General | Site: Groin | Laterality: Left

## 2018-03-03 MED ORDER — DEXAMETHASONE SODIUM PHOSPHATE 4 MG/ML IJ SOLN
INTRAMUSCULAR | Status: DC | PRN
  Administered 2018-03-03: 10 mg via INTRAVENOUS

## 2018-03-03 MED ORDER — VITAMIN K1 10 MG/ML IJ SOLN
10.0000 mg | Freq: Once | INTRAVENOUS | Status: AC
  Administered 2018-03-03: 10 mg via INTRAVENOUS
  Filled 2018-03-03: qty 1

## 2018-03-03 MED ORDER — MIDAZOLAM HCL 2 MG/2ML IJ SOLN
INTRAMUSCULAR | Status: AC
  Filled 2018-03-03: qty 2

## 2018-03-03 MED ORDER — ROCURONIUM BROMIDE 100 MG/10ML IV SOLN
INTRAVENOUS | Status: DC | PRN
  Administered 2018-03-03: 50 mg via INTRAVENOUS

## 2018-03-03 MED ORDER — LACTATED RINGERS IV SOLN
INTRAVENOUS | Status: DC | PRN
  Administered 2018-03-03: 10:00:00 via INTRAVENOUS

## 2018-03-03 MED ORDER — HYDROMORPHONE HCL 2 MG/ML IJ SOLN
INTRAMUSCULAR | Status: AC
  Administered 2018-03-03: 0.5 mg via INTRAVENOUS
  Filled 2018-03-03: qty 1

## 2018-03-03 MED ORDER — ONDANSETRON HCL 4 MG/2ML IJ SOLN: 4.0000 mg | Freq: Once | INTRAMUSCULAR | Status: DC | PRN

## 2018-03-03 MED ORDER — MEPERIDINE HCL 50 MG/ML IJ SOLN: 6.2500 mg | INTRAMUSCULAR | Status: DC | PRN

## 2018-03-03 MED ORDER — 0.9 % SODIUM CHLORIDE (POUR BTL) OPTIME
TOPICAL | Status: DC | PRN
  Administered 2018-03-03: 1000 mL

## 2018-03-03 MED ORDER — ENOXAPARIN SODIUM 120 MG/0.8ML ~~LOC~~ SOLN
120.0000 mg | Freq: Two times a day (BID) | SUBCUTANEOUS | Status: DC
  Administered 2018-03-03: 120 mg via SUBCUTANEOUS
  Filled 2018-03-03 (×2): qty 0.8

## 2018-03-03 MED ORDER — HYDROMORPHONE HCL 2 MG/ML IJ SOLN
0.2500 mg | INTRAMUSCULAR | Status: DC | PRN
  Administered 2018-03-03: 0.5 mg via INTRAVENOUS

## 2018-03-03 MED ORDER — SUGAMMADEX SODIUM 500 MG/5ML IV SOLN
INTRAVENOUS | Status: DC | PRN
  Administered 2018-03-03: 300 mg via INTRAVENOUS

## 2018-03-03 MED ORDER — FENTANYL CITRATE (PF) 250 MCG/5ML IJ SOLN
INTRAMUSCULAR | Status: AC
  Filled 2018-03-03: qty 5

## 2018-03-03 MED ORDER — WARFARIN SODIUM 10 MG PO TABS
12.5000 mg | ORAL_TABLET | Freq: Once | ORAL | Status: AC
  Administered 2018-03-03: 12.5 mg via ORAL
  Filled 2018-03-03: qty 1

## 2018-03-03 MED ORDER — MIDAZOLAM HCL 5 MG/5ML IJ SOLN
INTRAMUSCULAR | Status: DC | PRN
  Administered 2018-03-03: 2 mg via INTRAVENOUS

## 2018-03-03 MED ORDER — ONDANSETRON HCL 4 MG/2ML IJ SOLN
INTRAMUSCULAR | Status: DC | PRN
  Administered 2018-03-03: 4 mg via INTRAVENOUS

## 2018-03-03 MED ORDER — LIDOCAINE HCL (CARDIAC) PF 100 MG/5ML IV SOSY
PREFILLED_SYRINGE | INTRAVENOUS | Status: DC | PRN
  Administered 2018-03-03: 100 mg via INTRATRACHEAL

## 2018-03-03 MED ORDER — WARFARIN - PHARMACIST DOSING INPATIENT
Freq: Every day | Status: DC
  Administered 2018-03-03 – 2018-03-06 (×4)

## 2018-03-03 MED ORDER — PROPOFOL 10 MG/ML IV BOLUS
INTRAVENOUS | Status: AC
  Filled 2018-03-03: qty 20

## 2018-03-03 MED ORDER — PROPOFOL 10 MG/ML IV BOLUS
INTRAVENOUS | Status: DC | PRN
  Administered 2018-03-03: 120 mg via INTRAVENOUS

## 2018-03-03 MED ORDER — VANCOMYCIN HCL IN DEXTROSE 1-5 GM/200ML-% IV SOLN
1000.0000 mg | INTRAVENOUS | Status: AC
  Administered 2018-03-03: 1000 mg via INTRAVENOUS
  Filled 2018-03-03: qty 200

## 2018-03-03 MED ORDER — FENTANYL CITRATE (PF) 250 MCG/5ML IJ SOLN
INTRAMUSCULAR | Status: DC | PRN
  Administered 2018-03-03 (×2): 50 ug via INTRAVENOUS
  Administered 2018-03-03: 100 ug via INTRAVENOUS

## 2018-03-03 SURGICAL SUPPLY — 38 items
BANDAGE ACE 4X5 VEL STRL LF (GAUZE/BANDAGES/DRESSINGS) IMPLANT
BANDAGE ACE 6X5 VEL STRL LF (GAUZE/BANDAGES/DRESSINGS) IMPLANT
BNDG GAUZE ELAST 4 BULKY (GAUZE/BANDAGES/DRESSINGS) IMPLANT
CANISTER SUCT 3000ML PPV (MISCELLANEOUS) ×3 IMPLANT
COVER SURGICAL LIGHT HANDLE (MISCELLANEOUS) ×3 IMPLANT
DRAPE HALF SHEET 40X57 (DRAPES) ×3 IMPLANT
DRAPE INCISE IOBAN 66X45 STRL (DRAPES) IMPLANT
DRAPE ORTHO SPLIT 77X108 STRL (DRAPES)
DRAPE SURG ORHT 6 SPLT 77X108 (DRAPES) IMPLANT
DRAPE UNIVERSAL PACK (DRAPES) ×3 IMPLANT
DRSG MEPITEL 3X4 ME34 (GAUZE/BANDAGES/DRESSINGS) ×3 IMPLANT
DRSG VAC ATS SM SENSATRAC (GAUZE/BANDAGES/DRESSINGS) ×6 IMPLANT
ELECT REM PT RETURN 9FT ADLT (ELECTROSURGICAL) ×3
ELECTRODE REM PT RTRN 9FT ADLT (ELECTROSURGICAL) ×2 IMPLANT
GAUZE SPONGE 4X4 12PLY STRL (GAUZE/BANDAGES/DRESSINGS) ×3 IMPLANT
GLOVE BIO SURGEON STRL SZ7.5 (GLOVE) ×3 IMPLANT
GLOVE BIOGEL PI IND STRL 8 (GLOVE) ×2 IMPLANT
GLOVE BIOGEL PI INDICATOR 8 (GLOVE) ×1
GOWN STRL REUS W/ TWL LRG LVL3 (GOWN DISPOSABLE) ×6 IMPLANT
GOWN STRL REUS W/TWL LRG LVL3 (GOWN DISPOSABLE) ×3
KIT BASIN OR (CUSTOM PROCEDURE TRAY) ×3 IMPLANT
KIT TURNOVER KIT B (KITS) ×3 IMPLANT
NS IRRIG 1000ML POUR BTL (IV SOLUTION) ×3 IMPLANT
PACK CV ACCESS (CUSTOM PROCEDURE TRAY) IMPLANT
PACK GENERAL/GYN (CUSTOM PROCEDURE TRAY) IMPLANT
PAD ARMBOARD 7.5X6 YLW CONV (MISCELLANEOUS) ×6 IMPLANT
SUT ETHILON 3 0 PS 1 (SUTURE) IMPLANT
SUT SILK 2 0 (SUTURE) ×1
SUT SILK 2-0 18XBRD TIE 12 (SUTURE) ×2 IMPLANT
SUT VIC AB 2-0 CTB1 (SUTURE) ×3 IMPLANT
SUT VIC AB 3-0 SH 27 (SUTURE)
SUT VIC AB 3-0 SH 27X BRD (SUTURE) IMPLANT
SUT VICRYL 4-0 PS2 18IN ABS (SUTURE) IMPLANT
SWAB COLLECTION DEVICE MRSA (MISCELLANEOUS) ×3 IMPLANT
TOWEL GREEN STERILE (TOWEL DISPOSABLE) ×3 IMPLANT
TUBE ANAEROBIC SPECIMEN COL (MISCELLANEOUS) ×3 IMPLANT
WATER STERILE IRR 1000ML POUR (IV SOLUTION) ×3 IMPLANT
WND VAC CANISTER 500ML (MISCELLANEOUS) ×3 IMPLANT

## 2018-03-03 NOTE — Op Note (Signed)
    NAME: Terri Sparks    MRN: [id number] DOB: Apr 16, 1976    DATE OF OPERATION: 03/03/2018  PREOP DIAGNOSIS:    Infected left groin wound  POSTOP DIAGNOSIS:    Same  PROCEDURE:    Exploration of left groin wound, Evacuation of lymphocele, Excisional debridement of wound including skin and soft tissue, Placement of VAC  SURGEON: Judeth Cornfield. Scot Dock, MD, FACS  ASSIST: Laurence Slate, PA  ANESTHESIA: General  EBL: Minimal  INDICATIONS:    [name] is a 42 y.o. female who would had a previous patch angioplasty of the common femoral and soup proximal superficial femoral artery.  She developed some separation of her wound with drainage.  There was concern of infection.  FINDINGS:   Intraoperative cultures were sent.  There was a moderate amount of lymphatic fluid which was evacuated.  The patch angioplasty was intact.  TECHNIQUE:   The patient was taken to the operating room and received a general anesthetic.  The left groin was prepped and draped in usual sterile fashion.  The previous incision was opened.  There was a moderate amount of lymphatic drainage which was evacuated.  I fully exposed the arterial repair and this was intact with no evidence of bleeding.  I irrigated the wound with copious amounts of saline.  Hemostasis was obtained using electrocautery.  I was then able to close a deep layer over the top of the artery.  I then did excisional debridement of an ellipse of skin and soft tissue surrounding the wound as there was maceration of the skin.  This was done with a 10 blade.  Hemostasis was obtained using electrocautery.  Next a VAC was placed.  The patient tolerated the procedure well was transferred to the recovery room in stable condition.  All needle and sponge counts were correct.  [name], MD, FACS [facility]  DATE OF DICTATION:   03/03/2018

## 2018-03-03 NOTE — Anesthesia Postprocedure Evaluation (Signed)
Anesthesia Post Note  Patient: Terri Sparks  Procedure(s) Performed: WOUND EXPLORATION (Left Groin) DEBRIDEMENT WOUND (Left Groin) APPLICATION OF WOUND VAC (Left Groin)     Patient location during evaluation: PACU Anesthesia Type: General Level of consciousness: awake and alert Pain management: pain level controlled Vital Signs Assessment: post-procedure vital signs reviewed and stable Respiratory status: spontaneous breathing, nonlabored ventilation, respiratory function stable and patient connected to nasal cannula oxygen Cardiovascular status: blood pressure returned to baseline and stable Postop Assessment: no apparent nausea or vomiting Anesthetic complications: no    Last Vitals:  Vitals:   03/03/18 1235 03/03/18 1250  BP: 124/70 128/61  Pulse: 88 87  Resp: 14 15  Temp:    SpO2: 96% 90%    Last Pain:  Vitals:   03/03/18 1150  TempSrc:   PainSc: 10-Worst pain ever                 Seniya Stoffers DAVID

## 2018-03-03 NOTE — H&P (View-Only) (Signed)
  Progress Note    03/03/2018 7:30 AM 6 Days Post-Op  Subjective:  Says she has had drainage from her left groin  Afebrile HR 80's-100's NSR 372'B-021'J systolic 15% RA  Vitals:   03/03/18 0112 03/03/18 0620  BP: (!) 106/54 (!) 115/51  Pulse: 96   Resp: (!) 23 16  Temp: 98.9 F (37.2 C) 98.2 F (36.8 C)  SpO2: 99% 94%    Physical Exam: Cardiac:  regular Lungs:  Non labored Incisions:  Left groin with wound breakdown and serous drainage; mild erythema Extremities:  Left foot is warm and well perfused.   CBC    Component Value Date/Time   WBC 15.6 (H) 03/03/2018 0349   RBC 4.29 03/03/2018 0349   HGB 9.8 (L) 03/03/2018 0349   HGB 11.0 (L) 01/27/2018 1157   HCT 33.8 (L) 03/03/2018 0349   HCT 37.6 01/27/2018 1157   PLT 562 (H) 03/03/2018 0349   PLT 490 (H) 01/27/2018 1157   MCV 78.8 03/03/2018 0349   MCV 78 (L) 01/27/2018 1157   MCH 22.8 (L) 03/03/2018 0349   MCHC 29.0 (L) 03/03/2018 0349   RDW 17.3 (H) 03/03/2018 0349   RDW 17.1 (H) 01/27/2018 1157   LYMPHSABS 3.8 02/25/2018 0130   MONOABS 1.0 02/25/2018 0130   EOSABS 0.3 02/25/2018 0130   BASOSABS 0.1 02/25/2018 0130    BMET    Component Value Date/Time   NA 133 (L) 02/27/2018 0257   NA 134 01/27/2018 1157   K 4.3 02/27/2018 0257   CL 102 02/27/2018 0257   CO2 23 02/27/2018 0257   GLUCOSE 278 (H) 02/27/2018 0257   BUN 9 02/27/2018 0257   BUN 14 01/27/2018 1157   CREATININE 1.35 (H) 02/27/2018 0257   CALCIUM 8.4 (L) 02/27/2018 0257   GFRNONAA 47 (L) 02/27/2018 0257   GFRAA 55 (L) 02/27/2018 0257    INR    Component Value Date/Time   INR 1.80 03/03/2018 0349     Intake/Output Summary (Last 24 hours) at 03/03/2018 0730 Last data filed at 03/02/2018 1630 Gross per 24 hour  Intake 1840 ml  Output -  Net 1840 ml     Assessment:  42 y.o. female is s/p:  left lower extremity thromboembolectomy with patch angioplasty of left SFA and balloon angioplasty left common iliac artery stent  6 Days  Post-Op  Plan: -pt with wound breakdown and increased serous drainage from left groin.  Npo and will take to OR later today for left groin exploration and washout.  Consent ordered.  -pt also with increased leukocytosis this am despite Keflex.  -INR is 1.8 today-will give IV vit K 10mg  per Dr. Donzetta Matters. -DVT prophylaxis:  Hold coumadin/Lovenox   Leontine Locket, PA-C [facility] [phone] 03/03/2018 7:30 AM   I have interviewed and examined patient with PA and agree with assessment and plan above.   Dreonna Hussein C. Donzetta Matters, MD [facility] [place] Office: [phone] Pager: [phone]

## 2018-03-03 NOTE — Anesthesia Procedure Notes (Signed)
Procedure Name: Intubation Date/Time: 03/03/2018 10:55 AM Performed by: Mariea Clonts, CRNA Pre-anesthesia Checklist: Patient identified, Emergency Drugs available, Suction available and Patient being monitored Patient Re-evaluated:Patient Re-evaluated prior to induction Oxygen Delivery Method: Circle System Utilized Preoxygenation: Pre-oxygenation with 100% oxygen Induction Type: IV induction Ventilation: Mask ventilation without difficulty Laryngoscope Size: Miller and 2 Grade View: Grade I Tube type: Oral Tube size: 7.0 mm Number of attempts: 1 Airway Equipment and Method: Stylet and Oral airway Placement Confirmation: ETT inserted through vocal cords under direct vision,  positive ETCO2 and breath sounds checked- equal and bilateral Tube secured with: Tape Dental Injury: Teeth and Oropharynx as per pre-operative assessment

## 2018-03-03 NOTE — Progress Notes (Signed)
Patient returned to unit from surgery with L groin VAC in place at 125 mmHg and in stable condition. Pain well controlled and tolerating ice and water well. Patient requests to advance diet. Tele on and reoriented to room and unit.

## 2018-03-03 NOTE — Transfer of Care (Signed)
Immediate Anesthesia Transfer of Care Note  Patient: Terri Sparks  Procedure(s) Performed: WOUND EXPLORATION (Left Groin) DEBRIDEMENT WOUND (Left Groin) APPLICATION OF WOUND VAC (Left Groin)  Patient Location: PACU  Anesthesia Type:General  Level of Consciousness: awake, alert  and oriented  Airway & Oxygen Therapy: Patient Spontanous Breathing and Patient connected to nasal cannula oxygen  Post-op Assessment: Report given to RN and Post -op Vital signs reviewed and stable  Post vital signs: Reviewed and stable  Last Vitals:  Vitals Value Taken Time  BP    Temp    Pulse    Resp    SpO2      Last Pain:  Vitals:   03/03/18 0923  TempSrc: Oral  PainSc:          Complications: No apparent anesthesia complications

## 2018-03-03 NOTE — Progress Notes (Addendum)
  Progress Note    03/03/2018 7:30 AM 6 Days Post-Op  Subjective:  Says she has had drainage from her left groin  Afebrile HR 80's-100's NSR 756'E-332'R systolic 51% RA  Vitals:   03/03/18 0112 03/03/18 0620  BP: (!) 106/54 (!) 115/51  Pulse: 96   Resp: (!) 23 16  Temp: 98.9 F (37.2 C) 98.2 F (36.8 C)  SpO2: 99% 94%    Physical Exam: Cardiac:  regular Lungs:  Non labored Incisions:  Left groin with wound breakdown and serous drainage; mild erythema Extremities:  Left foot is warm and well perfused.   CBC    Component Value Date/Time   WBC 15.6 (H) 03/03/2018 0349   RBC 4.29 03/03/2018 0349   HGB 9.8 (L) 03/03/2018 0349   HGB 11.0 (L) 01/27/2018 1157   HCT 33.8 (L) 03/03/2018 0349   HCT 37.6 01/27/2018 1157   PLT 562 (H) 03/03/2018 0349   PLT 490 (H) 01/27/2018 1157   MCV 78.8 03/03/2018 0349   MCV 78 (L) 01/27/2018 1157   MCH 22.8 (L) 03/03/2018 0349   MCHC 29.0 (L) 03/03/2018 0349   RDW 17.3 (H) 03/03/2018 0349   RDW 17.1 (H) 01/27/2018 1157   LYMPHSABS 3.8 02/25/2018 0130   MONOABS 1.0 02/25/2018 0130   EOSABS 0.3 02/25/2018 0130   BASOSABS 0.1 02/25/2018 0130    BMET    Component Value Date/Time   NA 133 (L) 02/27/2018 0257   NA 134 01/27/2018 1157   K 4.3 02/27/2018 0257   CL 102 02/27/2018 0257   CO2 23 02/27/2018 0257   GLUCOSE 278 (H) 02/27/2018 0257   BUN 9 02/27/2018 0257   BUN 14 01/27/2018 1157   CREATININE 1.35 (H) 02/27/2018 0257   CALCIUM 8.4 (L) 02/27/2018 0257   GFRNONAA 47 (L) 02/27/2018 0257   GFRAA 55 (L) 02/27/2018 0257    INR    Component Value Date/Time   INR 1.80 03/03/2018 0349     Intake/Output Summary (Last 24 hours) at 03/03/2018 0730 Last data filed at 03/02/2018 1630 Gross per 24 hour  Intake 1840 ml  Output -  Net 1840 ml     Assessment:  42 y.o. female is s/p:  left lower extremity thromboembolectomy with patch angioplasty of left SFA and balloon angioplasty left common iliac artery stent  6 Days  Post-Op  Plan: -pt with wound breakdown and increased serous drainage from left groin.  Npo and will take to OR later today for left groin exploration and washout.  Consent ordered.  -pt also with increased leukocytosis this am despite Keflex.  -INR is 1.8 today-will give IV vit K 10mg  per Dr. Donzetta Matters. -DVT prophylaxis:  Hold coumadin/Lovenox   Leontine Locket, PA-C [facility] [phone] 03/03/2018 7:30 AM   I have interviewed and examined patient with PA and agree with assessment and plan above.   Jermia Rigsby C. Donzetta Matters, MD [facility] [place] Office: [phone] Pager: [phone]

## 2018-03-03 NOTE — Interval H&P Note (Signed)
History and Physical Interval Note:  03/03/2018 9:57 AM  Terri Sparks  has presented today for surgery, with the diagnosis of infection  The various methods of treatment have been discussed with the patient and family. After consideration of risks, benefits and other options for treatment, the patient has consented to  Procedure(s): GROIN WASHOUT (Left) as a surgical intervention .  The patient's history has been reviewed, patient examined, no change in status, stable for surgery.  I have reviewed the patient's chart and labs.  Questions were answered to the patient's satisfaction.     [name]

## 2018-03-03 NOTE — Anesthesia Preprocedure Evaluation (Signed)
Anesthesia Evaluation  Patient identified by MRN, date of birth, ID band Patient awake    Reviewed: Allergy & Precautions, NPO status , Patient's Chart, lab work & pertinent test results  Airway Mallampati: III  TM Distance: <3 FB Neck ROM: Full    Dental  (+) Dental Advisory Given, Loose, Poor Dentition, Missing,    Pulmonary neg pulmonary ROS, Current Smoker,    Pulmonary exam normal breath sounds clear to auscultation       Cardiovascular + Peripheral Vascular Disease  negative cardio ROS Normal cardiovascular exam Rhythm:Regular Rate:Normal     Neuro/Psych negative neurological ROS  negative psych ROS   GI/Hepatic Neg liver ROS,   Endo/Other  diabetes, Poorly ControlledMorbid obesity  Renal/GU negative Renal ROS  negative genitourinary   Musculoskeletal negative musculoskeletal ROS (+)   Abdominal   Peds negative pediatric ROS (+)  Hematology negative hematology ROS (+)   Anesthesia Other Findings   Reproductive/Obstetrics negative OB ROS                             Anesthesia Physical Anesthesia Plan  ASA: III  Anesthesia Plan: General   Post-op Pain Management:    Induction: Intravenous  PONV Risk Score and Plan: 2 and Treatment may vary due to age or medical condition  Airway Management Planned: Oral ETT  Additional Equipment:   Intra-op Plan:   Post-operative Plan: Extubation in OR  Informed Consent: I have reviewed the patients History and Physical, chart, labs and discussed the procedure including the risks, benefits and alternatives for the proposed anesthesia with the patient or authorized representative who has indicated his/her understanding and acceptance.   Dental advisory given  Plan Discussed with: CRNA  Anesthesia Plan Comments:         Anesthesia Quick Evaluation

## 2018-03-03 NOTE — Progress Notes (Signed)
ANTICOAGULATION CONSULT NOTE - Follow Up Consult  Pharmacy Consult for Lovenox and Coumadin Indication: LLE thrombosis/thromboembolism  Allergies  Allergen Reactions  . Ciprofloxacin Hcl Hives  . Macrobid [Nitrofurantoin Macrocrystal] Hives, Shortness Of Breath and Rash  . Other Hives, Shortness Of Breath and Rash    NO "-CILLINS"!!!  . Penicillins Shortness Of Breath    Had had cephalosporins without incident  . Sulfa Antibiotics Hives, Shortness Of Breath and Rash  . Lexapro [Escitalopram Oxalate]     I just did not like it.    Patient Measurements: Height: 5\' 7"  (170.2 cm) Weight: 279 lb (126.6 kg) IBW/kg (Calculated) : 61.6  Vital Signs: Temp: 98.2 F (36.8 C) (05/31 0923) Temp Source: Oral (05/31 0923) BP: 128/61 (05/31 1250) Pulse Rate: 87 (05/31 1250)  Labs: Recent Labs    03/01/18 0439 03/02/18 0335 03/03/18 0349  HGB 8.7* 9.0* 9.8*  HCT 29.6* 30.8* 33.8*  PLT 450* 486* 562*  LABPROT 14.6 17.0* 20.7*  INR 1.15 1.40 1.80    Estimated Creatinine Clearance: 74.3 mL/min (A) (by C-G formula based on SCr of 1.35 mg/dL (H)).  Assessment:   73 yof with L iliac and popliteal thrombus now s/p OR 5/25 for thromboembolectomy. Pharmacy has been assisting with Coumadin dosing with Lovenox bridge.  INR has been slow to rise, but up to 1.8 this morning after Coumadin.   Went to OR this morning for exploration, debridement and wound VAC placement.    Vitamin K 10 mg IV given this morning and am Lovenox dose held. Anticoagulation to resume tonight post-op.  Continues on Cephalexin.  Goal of Therapy:  INR 2-3 Anti-Xa level 0.6-1 units/ml 4hrs after LMWH dose given  Monitor platelets by anticoagulation protocol: Yes  Plan:   Coumadin 12.5 mg again tonight.   Lovenox 115 > 120 mg sq q12hrs to resume at 10pm tonight.  Daily PT/INR.  CBC and bmet in am.   Arty Baumgartner, [place] Pager: [phone] 03/03/2018,1:37 PM

## 2018-03-04 ENCOUNTER — Encounter (HOSPITAL_COMMUNITY): Payer: Self-pay | Admitting: Vascular Surgery

## 2018-03-04 LAB — CBC
HCT: 28.7 % — ABNORMAL LOW (ref 36.0–46.0)
Hemoglobin: 8.4 g/dL — ABNORMAL LOW (ref 12.0–15.0)
MCH: 23.1 pg — ABNORMAL LOW (ref 26.0–34.0)
MCHC: 29.3 g/dL — ABNORMAL LOW (ref 30.0–36.0)
MCV: 79.1 fL (ref 78.0–100.0)
Platelets: 493 10*3/uL — ABNORMAL HIGH (ref 150–400)
RBC: 3.63 MIL/uL — ABNORMAL LOW (ref 3.87–5.11)
RDW: 17.2 % — ABNORMAL HIGH (ref 11.5–15.5)
WBC: 15.5 10*3/uL — ABNORMAL HIGH (ref 4.0–10.5)

## 2018-03-04 LAB — BASIC METABOLIC PANEL
Anion gap: 8 (ref 5–15)
BUN: 16 mg/dL (ref 6–20)
CO2: 27 mmol/L (ref 22–32)
Calcium: 8.6 mg/dL — ABNORMAL LOW (ref 8.9–10.3)
Chloride: 98 mmol/L — ABNORMAL LOW (ref 101–111)
Creatinine, Ser: 1.29 mg/dL — ABNORMAL HIGH (ref 0.44–1.00)
GFR calc Af Amer: 58 mL/min — ABNORMAL LOW (ref >60)
GFR calc non Af Amer: 50 mL/min — ABNORMAL LOW (ref >60)
Glucose, Bld: 237 mg/dL — ABNORMAL HIGH (ref 65–99)
Potassium: 4.3 mmol/L (ref 3.5–5.1)
Sodium: 133 mmol/L — ABNORMAL LOW (ref 135–145)

## 2018-03-04 LAB — GLUCOSE, CAPILLARY
Glucose-Capillary: 186 mg/dL — ABNORMAL HIGH (ref 65–99)
Glucose-Capillary: 220 mg/dL — ABNORMAL HIGH (ref 65–99)
Glucose-Capillary: 203 mg/dL — ABNORMAL HIGH (ref 65–99)
Glucose-Capillary: 168 mg/dL — ABNORMAL HIGH (ref 65–99)
Glucose-Capillary: 142 mg/dL — ABNORMAL HIGH (ref 65–99)

## 2018-03-04 LAB — PROTIME-INR
INR: 1.08
Prothrombin Time: 13.9 seconds (ref 11.4–15.2)

## 2018-03-04 MED ORDER — ENOXAPARIN SODIUM 150 MG/ML ~~LOC~~ SOLN
1.0000 mg/kg | Freq: Two times a day (BID) | SUBCUTANEOUS | Status: DC
  Administered 2018-03-04 – 2018-03-06 (×4): 130 mg via SUBCUTANEOUS
  Filled 2018-03-04 (×4): qty 0.86

## 2018-03-04 MED ORDER — HYDROXYZINE HCL 25 MG PO TABS
50.0000 mg | ORAL_TABLET | Freq: Every day | ORAL | Status: DC | PRN
  Administered 2018-03-04 – 2018-03-07 (×4): 50 mg via ORAL
  Filled 2018-03-04 (×4): qty 2

## 2018-03-04 MED ORDER — WARFARIN SODIUM 10 MG PO TABS
12.5000 mg | ORAL_TABLET | Freq: Once | ORAL | Status: AC
  Administered 2018-03-04: 12.5 mg via ORAL
  Filled 2018-03-04: qty 1

## 2018-03-04 NOTE — Progress Notes (Signed)
[place] for Lovenox/Warfarin Indication: LLE ischemia/thrombosis  Allergies  Allergen Reactions  . Ciprofloxacin Hcl Hives  . Macrobid [Nitrofurantoin Macrocrystal] Hives, Shortness Of Breath and Rash  . Other Hives, Shortness Of Breath and Rash    NO "-CILLINS"!!!  . Penicillins Shortness Of Breath    Had had cephalosporins without incident  . Sulfa Antibiotics Hives, Shortness Of Breath and Rash  . Lexapro [Escitalopram Oxalate]     I just did not like it.    Patient Measurements: Height: 5' 7"  (170.2 cm) Weight: 286 lb (129.7 kg)(weighed on both scales give me the same numbers) IBW/kg (Calculated) : 61.6 Heparin Dosing Weight: 90 kg  Vital Signs: Temp: 98.7 F (37.1 C) (06/01 0928) Temp Source: Oral (06/01 0928) BP: 144/91 (06/01 0928) Pulse Rate: 109 (06/01 0928)  Labs: Recent Labs    03/02/18 0335 03/03/18 0349 03/04/18 0356  HGB 9.0* 9.8* 8.4*  HCT 30.8* 33.8* 28.7*  PLT 486* 562* 493*  LABPROT 17.0* 20.7* 13.9  INR 1.40 1.80 1.08  CREATININE  --   --  1.29*   Estimated Creatinine Clearance: 78.8 mL/min (A) (by C-G formula based on SCr of 1.29 mg/dL (H)).  Medical History: Past Medical History:  Diagnosis Date  . Anxiety   . Diabetes ([place])   . GERD (gastroesophageal reflux disease)   . History of blood clots   . Hypercalcemia   . Hyperparathyroidism   . Nephrolithiasis   . Renal calculi    Medications:  No current facility-administered medications on file prior to encounter.    Current Outpatient Medications on File Prior to Encounter  Medication Sig Dispense Refill  . acetaminophen (TYLENOL) 500 MG tablet Take 1,000 mg by mouth every 6 (six) hours as needed for moderate pain.    . naproxen sodium (ALEVE) 220 MG tablet Take 220 mg by mouth daily as needed (pain).    . blood glucose meter kit and supplies Dispense based on patient and insurance preference. Use up to four times daily as directed. (FOR ICD-9  250.00, 250.01). 1 each 0  . Blood Glucose Monitoring Suppl (TRUE METRIX METER) w/Device KIT Use as directed 1 kit 0  . buPROPion (BUPROBAN) 150 MG 12 hr tablet Take 1 tablet (150 mg total) by mouth 2 (two) times daily. (Patient not taking: Reported on 02/25/2018) 60 tablet 2  . clopidogrel (PLAVIX) 75 MG tablet Take 1 tablet (75 mg total) by mouth daily. (Patient not taking: Reported on 02/25/2018) 30 tablet 10  . ferrous sulfate 325 (65 FE) MG tablet Take 1 tablet (325 mg total) by mouth daily with breakfast. (Patient not taking: Reported on 02/25/2018) 100 tablet 1  . gabapentin (NEURONTIN) 300 MG capsule Take 1 capsule (300 mg total) by mouth 2 (two) times daily. (Patient not taking: Reported on 02/25/2018) 60 capsule 3  . glucose blood (TRUE METRIX BLOOD GLUCOSE TEST) test strip Use as instructed 100 each 12  . glucose blood test strip Use as instructed 100 each 0  . hydrOXYzine (ATARAX/VISTARIL) 25 MG tablet Take 2 tablets (50 mg total) by mouth at bedtime. TAKE 1 TABLET BY MOUTH AT BEDTIME AS NEEDED FOR ANXIETY. (Patient not taking: Reported on 02/25/2018) 60 tablet 2  . insulin aspart (NOVOLOG) 100 UNIT/ML injection 5-15 units subcut TID with meals (Patient not taking: Reported on 02/25/2018) 10 mL 11  . insulin glargine (LANTUS) 100 UNIT/ML injection Inject 0.7 mLs (70 Units total) into the skin daily. (Patient not taking: Reported on 02/25/2018)  10 mL 11  . Insulin Pen Needle 31G X 5 MM MISC Use with Lantus and Novolog injections. 100 each 11  . Insulin Syringe-Needle U-100 (BD INSULIN SYRINGE ULTRAFINE) 31G X 5/16" 0.5 ML MISC Use as directed 100 each 3  . lisinopril (PRINIVIL,ZESTRIL) 5 MG tablet Take 0.5 tablets (2.5 mg total) by mouth daily. (Patient not taking: Reported on 02/25/2018) 45 tablet 3  . nicotine (NICODERM CQ - DOSED IN MG/24 HOURS) 14 mg/24hr patch Place 1 patch (14 mg total) onto the skin daily. (Patient not taking: Reported on 02/25/2018) 28 patch 1  . omeprazole (PRILOSEC) 20 MG  capsule Take 2 capsules (40 mg total) by mouth daily. (Patient not taking: Reported on 02/25/2018) 60 capsule 3  . sitaGLIPtin (JANUVIA) 50 MG tablet Take 1 tablet (50 mg total) by mouth daily. (Patient not taking: Reported on 02/25/2018) 30 tablet 3  . traZODone (DESYREL) 50 MG tablet Take 0.5 tablets (25 mg total) by mouth at bedtime as needed for sleep. (Patient not taking: Reported on 02/25/2018) 30 tablet 3  . TRUEPLUS LANCETS 28G MISC Use as directed 100 each 6    Assessment: 31 yof with L iliac and popliteal thrombus now s/p OR 5/25 for thromboembolectomy. Pharmacy consulted to transition patient to warfarin with Lovenox bridge. Hg down to 8.4, plt stable - no overt bleeding reported. INR back to down to 1.08 after vitamin K 84m IV given prior to OR for groin washout/debridement, placement of wound vac on 5/31. INR previously started to increase on 12.5935mdoses. CrCl>30. Noted on Keflex.  Goal of Therapy:  INR 2-3 Monitor platelets by anticoagulation protocol: Yes   Plan:  Warfarin 12.35m69mO x 1 Lovenox to 130m62m1 mg/kg) [place] Q12h - watch weight, trending up Monitor daily INR, CBC at least q72h on Lovenox, SCr, for s/sx bleeding  HaleElicia LamparmD, BCPS Clinical Pharmacist Clinical phone for 03/04/2018 until 3:30pm: [phone] 3:30pm, please call main pharmacy at: [phone] 03/04/2018 10:23 AM

## 2018-03-04 NOTE — Progress Notes (Addendum)
[facility]  Subjective  - Complaints about moving with all the cords.  Objective 109/60 82 98.6 F (37 C) (Oral) 20 100%  Intake/Output Summary (Last 24 hours) at 03/04/2018 0735 Last data filed at 03/03/2018 2010 Gross per 24 hour  Intake 800 ml  Output 75 ml  Net 725 ml   Left groin soft around wound vac, drainage output recorded 50 cc.   Vac intact to suction, additional dressing applied for cord support. Left foot warm and well perfused Heart RRR Lungs non labored breathing  Assessment/Planning: POD # 1 I & D left groin with wound vac placement Tissue coverage over patch angioplasty of left SFA   Plan for wound vac change Monday.  Will order wound vac for home. Pending wound appearance possible discharge home Mon or Tuesday. Coumadin restarted dose per pharmacy  Roxy Horseman 03/04/2018 7:35 AM  Laboratory Lab Results: Recent Labs    03/03/18 0349 03/04/18 0356  WBC 15.6* 15.5*  HGB 9.8* 8.4*  HCT 33.8* 28.7*  PLT 562* 493*   COAG Lab Results  Component Value Date   INR 1.08 03/04/2018   INR 1.80 03/03/2018   INR 1.40 03/02/2018   I have interviewed the patient and examined the patient. I agree with the findings by the PA. Intraoperative Gram stain showed abundant white blood cells but no organisms seen.  Culture is pending. Her VAC still has a good seal. She can potentially go home with her back if this is approved by insurance.  Gae Gallop, MD [phone]

## 2018-03-05 LAB — GLUCOSE, CAPILLARY
Glucose-Capillary: 99 mg/dL (ref 65–99)
Glucose-Capillary: 140 mg/dL — ABNORMAL HIGH (ref 65–99)
Glucose-Capillary: 127 mg/dL — ABNORMAL HIGH (ref 65–99)
Glucose-Capillary: 168 mg/dL — ABNORMAL HIGH (ref 65–99)

## 2018-03-05 LAB — PROTIME-INR
INR: 1.43
Prothrombin Time: 17.3 seconds — ABNORMAL HIGH (ref 11.4–15.2)

## 2018-03-05 MED ORDER — WARFARIN SODIUM 2.5 MG PO TABS
12.5000 mg | ORAL_TABLET | Freq: Once | ORAL | Status: AC
  Administered 2018-03-05: 12.5 mg via ORAL
  Filled 2018-03-05: qty 1

## 2018-03-05 NOTE — Progress Notes (Signed)
[place] for Lovenox/Warfarin Indication: LLE ischemia/thrombosis  Allergies  Allergen Reactions  . Ciprofloxacin Hcl Hives  . Macrobid [Nitrofurantoin Macrocrystal] Hives, Shortness Of Breath and Rash  . Other Hives, Shortness Of Breath and Rash    NO "-CILLINS"!!!  . Penicillins Shortness Of Breath    Had had cephalosporins without incident  . Sulfa Antibiotics Hives, Shortness Of Breath and Rash  . Lexapro [Escitalopram Oxalate]     I just did not like it.    Patient Measurements: Height: 5' 7"  (170.2 cm) Weight: 286 lb (129.7 kg)(weighed on both scales give me the same numbers) IBW/kg (Calculated) : 61.6 Heparin Dosing Weight: 90 kg  Vital Signs: Temp: 98.3 F (36.8 C) (06/02 0611) Temp Source: Oral (06/02 0611) BP: 108/52 (06/02 0611)  Labs: Recent Labs    03/03/18 0349 03/04/18 0356  HGB 9.8* 8.4*  HCT 33.8* 28.7*  PLT 562* 493*  LABPROT 20.7* 13.9  INR 1.80 1.08  CREATININE  --  1.29*   Estimated Creatinine Clearance: 78.8 mL/min (A) (by C-G formula based on SCr of 1.29 mg/dL (H)).  Medical History: Past Medical History:  Diagnosis Date  . Anxiety   . Diabetes ([place])   . GERD (gastroesophageal reflux disease)   . History of blood clots   . Hypercalcemia   . Hyperparathyroidism   . Nephrolithiasis   . Renal calculi    Medications:  No current facility-administered medications on file prior to encounter.    Current Outpatient Medications on File Prior to Encounter  Medication Sig Dispense Refill  . acetaminophen (TYLENOL) 500 MG tablet Take 1,000 mg by mouth every 6 (six) hours as needed for moderate pain.    . naproxen sodium (ALEVE) 220 MG tablet Take 220 mg by mouth daily as needed (pain).    . blood glucose meter kit and supplies Dispense based on patient and insurance preference. Use up to four times daily as directed. (FOR ICD-9 250.00, 250.01). 1 each 0  . Blood Glucose Monitoring Suppl (TRUE METRIX  METER) w/Device KIT Use as directed 1 kit 0  . buPROPion (BUPROBAN) 150 MG 12 hr tablet Take 1 tablet (150 mg total) by mouth 2 (two) times daily. (Patient not taking: Reported on 02/25/2018) 60 tablet 2  . clopidogrel (PLAVIX) 75 MG tablet Take 1 tablet (75 mg total) by mouth daily. (Patient not taking: Reported on 02/25/2018) 30 tablet 10  . ferrous sulfate 325 (65 FE) MG tablet Take 1 tablet (325 mg total) by mouth daily with breakfast. (Patient not taking: Reported on 02/25/2018) 100 tablet 1  . gabapentin (NEURONTIN) 300 MG capsule Take 1 capsule (300 mg total) by mouth 2 (two) times daily. (Patient not taking: Reported on 02/25/2018) 60 capsule 3  . glucose blood (TRUE METRIX BLOOD GLUCOSE TEST) test strip Use as instructed 100 each 12  . glucose blood test strip Use as instructed 100 each 0  . hydrOXYzine (ATARAX/VISTARIL) 25 MG tablet Take 2 tablets (50 mg total) by mouth at bedtime. TAKE 1 TABLET BY MOUTH AT BEDTIME AS NEEDED FOR ANXIETY. (Patient not taking: Reported on 02/25/2018) 60 tablet 2  . insulin aspart (NOVOLOG) 100 UNIT/ML injection 5-15 units subcut TID with meals (Patient not taking: Reported on 02/25/2018) 10 mL 11  . insulin glargine (LANTUS) 100 UNIT/ML injection Inject 0.7 mLs (70 Units total) into the skin daily. (Patient not taking: Reported on 02/25/2018) 10 mL 11  . Insulin Pen Needle 31G X 5 MM MISC Use with  Lantus and Novolog injections. 100 each 11  . Insulin Syringe-Needle U-100 (BD INSULIN SYRINGE ULTRAFINE) 31G X 5/16" 0.5 ML MISC Use as directed 100 each 3  . lisinopril (PRINIVIL,ZESTRIL) 5 MG tablet Take 0.5 tablets (2.5 mg total) by mouth daily. (Patient not taking: Reported on 02/25/2018) 45 tablet 3  . nicotine (NICODERM CQ - DOSED IN MG/24 HOURS) 14 mg/24hr patch Place 1 patch (14 mg total) onto the skin daily. (Patient not taking: Reported on 02/25/2018) 28 patch 1  . omeprazole (PRILOSEC) 20 MG capsule Take 2 capsules (40 mg total) by mouth daily. (Patient not taking:  Reported on 02/25/2018) 60 capsule 3  . sitaGLIPtin (JANUVIA) 50 MG tablet Take 1 tablet (50 mg total) by mouth daily. (Patient not taking: Reported on 02/25/2018) 30 tablet 3  . traZODone (DESYREL) 50 MG tablet Take 0.5 tablets (25 mg total) by mouth at bedtime as needed for sleep. (Patient not taking: Reported on 02/25/2018) 30 tablet 3  . TRUEPLUS LANCETS 28G MISC Use as directed 100 each 6    Assessment: 16 yof with L iliac and popliteal thrombus now s/p OR 5/25 for thromboembolectomy. Pharmacy consulted to transition patient to warfarin with Lovenox bridge. Hg down to 8.4, plt stable - no overt bleeding reported. INR back to down to 1.08 after vitamin K 35m IV given prior to OR for groin washout/debridement, placement of wound vac on 5/31. INR now back up to 1.43 this AM. CrCl>30. Noted on Keflex.  Goal of Therapy:  INR 2-3 Monitor platelets by anticoagulation protocol: Yes   Plan:  Warfarin 12.573mPO x 1 again tonight Lovenox to 13045m~1 mg/kg) [place] Q12h Monitor daily INR, CBC at least q72h on Lovenox, SCr, for s/sx bleeding  HalElicia LampharmD, BCPS Clinical Pharmacist Clinical phone for 03/05/2018 until 3:30pm: [phone] after 3:30pm, please call main pharmacy at: [phone] 03/05/2018 11:11 AM

## 2018-03-05 NOTE — Progress Notes (Addendum)
[facility]   Subjective  - doing better.    Objective (!) 108/52 90 98.3 F (36.8 C) (Oral) 18 96%  Intake/Output Summary (Last 24 hours) at 03/05/2018 0730 Last data filed at 03/05/2018 0700 Gross per 24 hour  Intake 1340 ml  Output 225 ml  Net 1115 ml    Left groin soft with wound vac in place.  Drainage SS 300 cc. Left LE warm with active range of motion well perfused. Heart RRR Lungs non labored breathing Gen NAD  Assessment/Planning: POD # 2 I & D left groin with wound vac placement Tissue coverage over patch angioplasty of left SFA    Wound vac in place with good seal.  Drainage SS.  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 Cultures without organisms pending final results. Plan for wound vac change tomorrow.  Home health wound vac ordered pending approval.    Roxy Horseman 03/05/2018 7:30 AM --  Laboratory Lab Results: Recent Labs    03/03/18 0349 03/04/18 0356  WBC 15.6* 15.5*  HGB 9.8* 8.4*  HCT 33.8* 28.7*  PLT 562* 493*   BMET Recent Labs    03/04/18 0356  NA 133*  K 4.3  CL 98*  CO2 27  GLUCOSE 237*  BUN 16  CREATININE 1.29*  CALCIUM 8.6*    COAG Lab Results  Component Value Date   INR 1.08 03/04/2018   INR 1.80 03/03/2018   INR 1.40 03/02/2018   No results found for: PTT  I have interviewed the patient and examined the patient. I agree with the findings by the PA. Her back still has a good seal.  VAC change tomorrow.  Gae Gallop, MD [phone]

## 2018-03-06 ENCOUNTER — Telehealth: Payer: Self-pay | Admitting: Vascular Surgery

## 2018-03-06 LAB — PROTIME-INR
INR: 1.87
Prothrombin Time: 21.4 seconds — ABNORMAL HIGH (ref 11.4–15.2)

## 2018-03-06 LAB — GLUCOSE, CAPILLARY
Glucose-Capillary: 158 mg/dL — ABNORMAL HIGH (ref 65–99)
Glucose-Capillary: 136 mg/dL — ABNORMAL HIGH (ref 65–99)
Glucose-Capillary: 180 mg/dL — ABNORMAL HIGH (ref 65–99)

## 2018-03-06 MED ORDER — OXYCODONE HCL 5 MG PO TABS: 5.0000 mg | ORAL_TABLET | Freq: Four times a day (QID) | ORAL | 0 refills | Status: DC | PRN

## 2018-03-06 MED ORDER — WARFARIN SODIUM 10 MG PO TABS
10.0000 mg | ORAL_TABLET | Freq: Once | ORAL | Status: AC
  Administered 2018-03-06: 10 mg via ORAL
  Filled 2018-03-06: qty 1

## 2018-03-06 MED ORDER — CEPHALEXIN 500 MG PO CAPS: 500.0000 mg | ORAL_CAPSULE | Freq: Four times a day (QID) | ORAL | 0 refills | Status: DC

## 2018-03-06 MED ORDER — ONDANSETRON HCL 4 MG PO TABS: 4.0000 mg | ORAL_TABLET | Freq: Three times a day (TID) | ORAL | 0 refills | Status: DC | PRN

## 2018-03-06 NOTE — Progress Notes (Addendum)
[facility]  Subjective  - Doing well.   Objective (!) 104/57 86 98 F (36.7 C) (Oral) 16 94%  Intake/Output Summary (Last 24 hours) at 03/06/2018 0724 Last data filed at 03/06/2018 0500 Gross per 24 hour  Intake 1200 ml  Output 200 ml  Net 1000 ml    Left groin wound vac in place. Vac output 200 last 24 hours SS fluid. Left LE foot warm well perfused with intact sensation and active range of motion. Heart RRR Lungs non labored breathing Gen NAD  Assessment/Planning: POD # 3 I & D left groin with wound vac placement Tissue coverage overpatch angioplasty of left SFA  Wound vac change today.  If wound healing, well without visible patch angioplasty the plan is to discharge home today with a home wound vac.   Pending wound vac approval     Roxy Horseman 03/06/2018 7:24 AM --  Laboratory Lab Results: Recent Labs    03/04/18 0356  WBC 15.5*  HGB 8.4*  HCT 28.7*  PLT 493*   BMET Recent Labs    03/04/18 0356  NA 133*  K 4.3  CL 98*  CO2 27  GLUCOSE 237*  BUN 16  CREATININE 1.29*  CALCIUM 8.6*    COAG Lab Results  Component Value Date   INR 1.87 03/06/2018   INR 1.43 03/05/2018   INR 1.08 03/04/2018   No results found for: PTT   I have independently interviewed and examined patient and agree with PA assessment and plan above.  I have removed the wound VAC this morning and the wound appears to be healing and there is coverage over the patch angioplasty site.  Will need set up for home wound VAC as well as INR check as an outpatient.  Alaija Ruble C. Donzetta Matters, MD [facility] [place] Office: [phone] Pager: [phone]

## 2018-03-06 NOTE — Progress Notes (Addendum)
[place] for Lovenox/Warfarin Indication: LLE ischemia/thrombosis  Allergies  Allergen Reactions  . Ciprofloxacin Hcl Hives  . Macrobid [Nitrofurantoin Macrocrystal] Hives, Shortness Of Breath and Rash  . Other Hives, Shortness Of Breath and Rash    NO "-CILLINS"!!!  . Penicillins Shortness Of Breath    Had had cephalosporins without incident  . Sulfa Antibiotics Hives, Shortness Of Breath and Rash  . Lexapro [Escitalopram Oxalate]     I just did not like it.    Patient Measurements: Height: '5\' 7"'$  (170.2 cm) Weight: 286 lb (129.7 kg)(weighed on both scales give me the same numbers) IBW/kg (Calculated) : 61.6 Heparin Dosing Weight: 90 kg  Vital Signs: Temp: 98.2 F (36.8 C) (06/03 0813) Temp Source: Oral (06/03 0813) BP: 113/74 (06/03 0949) Pulse Rate: 86 (06/03 0618)  Labs: Recent Labs    03/04/18 0356 03/05/18 0307 03/06/18 0252  HGB 8.4*  --   --   HCT 28.7*  --   --   PLT 493*  --   --   LABPROT 13.9 17.3* 21.4*  INR 1.08 1.43 1.87  CREATININE 1.29*  --   --    Estimated Creatinine Clearance: 78.8 mL/min (A) (by C-G formula based on SCr of 1.29 mg/dL (H)).  Medical History: Past Medical History:  Diagnosis Date  . Anxiety   . Diabetes ([place])   . GERD (gastroesophageal reflux disease)   . History of blood clots   . Hypercalcemia   . Hyperparathyroidism   . Nephrolithiasis   . Renal calculi    Medications:  No current facility-administered medications on file prior to encounter.    Current Outpatient Medications on File Prior to Encounter  Medication Sig Dispense Refill  . acetaminophen (TYLENOL) 500 MG tablet Take 1,000 mg by mouth every 6 (six) hours as needed for moderate pain.    . naproxen sodium (ALEVE) 220 MG tablet Take 220 mg by mouth daily as needed (pain).    . blood glucose meter kit and supplies Dispense based on patient and insurance preference. Use up to four times daily as directed. (FOR ICD-9  250.00, 250.01). 1 each 0  . Blood Glucose Monitoring Suppl (TRUE METRIX METER) w/Device KIT Use as directed 1 kit 0  . buPROPion (BUPROBAN) 150 MG 12 hr tablet Take 1 tablet (150 mg total) by mouth 2 (two) times daily. (Patient not taking: Reported on 02/25/2018) 60 tablet 2  . clopidogrel (PLAVIX) 75 MG tablet Take 1 tablet (75 mg total) by mouth daily. (Patient not taking: Reported on 02/25/2018) 30 tablet 10  . ferrous sulfate 325 (65 FE) MG tablet Take 1 tablet (325 mg total) by mouth daily with breakfast. (Patient not taking: Reported on 02/25/2018) 100 tablet 1  . gabapentin (NEURONTIN) 300 MG capsule Take 1 capsule (300 mg total) by mouth 2 (two) times daily. (Patient not taking: Reported on 02/25/2018) 60 capsule 3  . glucose blood (TRUE METRIX BLOOD GLUCOSE TEST) test strip Use as instructed 100 each 12  . glucose blood test strip Use as instructed 100 each 0  . hydrOXYzine (ATARAX/VISTARIL) 25 MG tablet Take 2 tablets (50 mg total) by mouth at bedtime. TAKE 1 TABLET BY MOUTH AT BEDTIME AS NEEDED FOR ANXIETY. (Patient not taking: Reported on 02/25/2018) 60 tablet 2  . insulin aspart (NOVOLOG) 100 UNIT/ML injection 5-15 units subcut TID with meals (Patient not taking: Reported on 02/25/2018) 10 mL 11  . insulin glargine (LANTUS) 100 UNIT/ML injection Inject 0.7 mLs (70  Units total) into the skin daily. (Patient not taking: Reported on 02/25/2018) 10 mL 11  . Insulin Pen Needle 31G X 5 MM MISC Use with Lantus and Novolog injections. 100 each 11  . Insulin Syringe-Needle U-100 (BD INSULIN SYRINGE ULTRAFINE) 31G X 5/16" 0.5 ML MISC Use as directed 100 each 3  . lisinopril (PRINIVIL,ZESTRIL) 5 MG tablet Take 0.5 tablets (2.5 mg total) by mouth daily. (Patient not taking: Reported on 02/25/2018) 45 tablet 3  . nicotine (NICODERM CQ - DOSED IN MG/24 HOURS) 14 mg/24hr patch Place 1 patch (14 mg total) onto the skin daily. (Patient not taking: Reported on 02/25/2018) 28 patch 1  . omeprazole (PRILOSEC) 20 MG  capsule Take 2 capsules (40 mg total) by mouth daily. (Patient not taking: Reported on 02/25/2018) 60 capsule 3  . sitaGLIPtin (JANUVIA) 50 MG tablet Take 1 tablet (50 mg total) by mouth daily. (Patient not taking: Reported on 02/25/2018) 30 tablet 3  . traZODone (DESYREL) 50 MG tablet Take 0.5 tablets (25 mg total) by mouth at bedtime as needed for sleep. (Patient not taking: Reported on 02/25/2018) 30 tablet 3  . TRUEPLUS LANCETS 28G MISC Use as directed 100 each 6    Assessment:  12 yof with L iliac and popliteal thrombus now s/p OR 5/25 for thromboembolectomy. Pharmacy consulted to transition patient to warfarin with Lovenox bridge. Of note- vitamin K 32m IV given prior to OR for groin washout/debridement, placement of wound vac on 5/31. Noted plans for possible discharge after wound vac change -INR up to 1.87    Goal of Therapy:  INR 2-3 Monitor platelets by anticoagulation protocol: Yes   Plan:  -Coumadin 174mpo tonight  -Would consider discharge on 1038mo daily with INR on Wednesday or Thursday if possible -Would continue lovenox until INR is at goal  -Daily INR if remains inpatient  AndHildred LaserharmD Clinical Pharmacist Clinical phone from 8:30-4:00 is [phone] After 4pm, please call Main [facility] [phone]) for assistance. 03/06/2018 11:14 AM

## 2018-03-06 NOTE — Telephone Encounter (Signed)
sch appt lvm 03/24/18 3pm wound check

## 2018-03-06 NOTE — Care Management Note (Addendum)
Case Management Note Previous CM note completed by Terri Rasher, RN 03/02/2018, 8:03 PM   Patient Details  Name: Terri Sparks MRN: [id number] Date of Birth: [birth year]  Subjective/Objective:     left lower extremity thromboembolectomy with patch angioplasty of left SFA and balloon angioplasty left common iliac artery stent                Action/Plan: NCM spoke to pt and boyfriend at bedside. Pt has application for [employer] orange card. She goes to the [facility] and has appt on 7/25. Explained she will need to call and get an earlier appt. States she gets her medications at [facility] at discount but she is not sure about new dc meds. Will provided pt with MATCH letter and explained $3 copay and once per year use. States she has applied for disability but was denied. She applied for Food Stamps and waiting to hear back. States Development worker, community has seen while in hospital.   Expected Discharge Date:  03/06/18               Expected Discharge Plan:  [place]  In-House Referral:  Clinical Social Work  Discharge planning Services  CM Consult, [place] Program, Medication Assistance  Post Acute Care Choice:  Durable Medical Equipment, Home Health Choice offered to:  Patient  DME Arranged:  Negative pressure wound device DME Agency:  [facility]  HH Arranged:  RN Blue Agency:  [facility], [place]  Status of Service:  Completed, signed off  If discussed at [place] of Stay Meetings, dates discussed:    Discharge Disposition: home/home health   Additional Comments:  03/06/18- 1000- Terri Gibbons RN, CM-  Pt for d/c home today- referral received for [facility] for home wound VAC drsg. Changes and pt will also need charity wound VAC with [facility]- spoke with pt at bedside regarding transition of care needs for discharge home- confirmed pt's address and needed paper work filled out per pt for [employer] program- spoke with Tommy at [facility] for home wound VAC needs- paper work faxed to  [employer] for charity home VAC approval-pending approval for home wound VAC- pt will d/c later today if home wound VAC approved and can be delivered later today. Discussed HH needs and use of [facility] for charity [facility]- pt agreeable to this and referral called to Butch Penny with [facility]- who will come speak with pt and assess for charity program. Pt states she is applying for orange card with [facility] and also has spoken with [facility] regarding medicaid and disability paperwork. Pt has been provided [facility] letter for medications needs and has this at bedside for use on discharge- reviewed cost $3 per script with pt who states she can afford this and has list of pharmacies to use. Will updated on [facility] wound VAC as CM gets updates on approval and delivery.  1700 update- spoke with [facility] rep. Tommy- home wound VAC approval still pending- may still be approved this evening and possibly delivered if not then it will be tomorrow AM- have updated both patient and bedside RN- Terri Sparks.   Terri Sparks [place], RN 03/06/2018, 4:30 PM [phone] 4E Transition Care Coordinator

## 2018-03-06 NOTE — Consult Note (Addendum)
[place] Nurse wound consult note Reason for Consult: Left groin surgical site VAC dressing application Wound type: Surgical site Measurement: 2 cm x 7.2 cm x 4 cm Wound bed: Pale pink.  According to [facility], the physician had seen the wound this morning, removed the prior VAC dressing and the medical team did not need to be present with the re-application of NPWT. Drainage (amount, consistency, odor) heavy serosanginous Periwound: Intact skin normal color and texture Dressing procedure/placement/frequency: Wound bed was covered with a single piece of Mepitel.  One piece of black foam dressing was cut and inserted into the wound, covering all wound areas and drape was placed.  A "no leak" seal was obtained.  The remaining pieces of Mepitel and drape, as well as suture removal kit, was left with the patient for when she is D/Cd.  The patient tolerated the procedure without difficulty. Monitor the wound area(s) for worsening of condition such as: Signs/symptoms of infection,  Increase in size,  Development of or worsening of odor, Development of pain, or increased pain at the affected locations.  Notify the medical team if any of these develop.  Thank you for the consult.  Discussed plan of care with the patient and bedside nurse.  [place] nurse will not follow at this time.  Please re-consult the [place] team if needed.  Val Riles, RN, MSN, CWOCN, CNS-BC, pager [phone]

## 2018-03-07 LAB — GLUCOSE, CAPILLARY
Glucose-Capillary: 141 mg/dL — ABNORMAL HIGH (ref 65–99)
Glucose-Capillary: 163 mg/dL — ABNORMAL HIGH (ref 65–99)
Glucose-Capillary: 155 mg/dL — ABNORMAL HIGH (ref 65–99)

## 2018-03-07 LAB — PROTIME-INR
INR: 2.01
Prothrombin Time: 22.6 seconds — ABNORMAL HIGH (ref 11.4–15.2)

## 2018-03-07 MED ORDER — WARFARIN SODIUM 5 MG PO TABS: 5.0000 mg | ORAL_TABLET | Freq: Every day | ORAL | 0 refills | Status: DC

## 2018-03-07 MED ORDER — WARFARIN SODIUM 10 MG PO TABS
10.0000 mg | ORAL_TABLET | Freq: Once | ORAL | Status: DC
  Filled 2018-03-07: qty 1

## 2018-03-07 MED ORDER — LORAZEPAM 1 MG PO TABS: 1.0000 mg | ORAL_TABLET | ORAL | 0 refills | Status: AC

## 2018-03-07 MED ORDER — NYSTATIN 100000 UNIT/ML MT SUSP: 5.0000 mL | Freq: Four times a day (QID) | OROMUCOSAL | 0 refills | Status: DC

## 2018-03-07 MED ORDER — CYCLOBENZAPRINE HCL 5 MG PO TABS: 5.0000 mg | ORAL_TABLET | ORAL | 0 refills | Status: AC

## 2018-03-07 MED FILL — $LANTUS 100 UNITS/ML VIAL: 100 | 28 days supply | Qty: 20 | Fill #1

## 2018-03-07 MED FILL — OMEPRAZOLE 20 MG CAP: 20 | 30 days supply | Qty: 60 | Fill #2

## 2018-03-07 MED FILL — GABAPENTIN 300 MG CAPSULE: 300 | 30 days supply | Qty: 60 | Fill #3

## 2018-03-07 MED FILL — FERROUS SULFATE 325 MG TAB: 325 (65 FE) | 30 days supply | Qty: 30 | Fill #0

## 2018-03-07 MED FILL — BUPROPION SR 150 MG TABLET: 150 | 30 days supply | Qty: 60 | Fill #0

## 2018-03-07 MED FILL — CLOPIDOGREL 75 MG TABLET: 75 | 30 days supply | Qty: 30 | Fill #3

## 2018-03-07 MED FILL — hydrOXYzine HCL 25 MG TABS: 25 | 30 days supply | Qty: 60 | Fill #0

## 2018-03-07 MED FILL — !NOVOLOG 100UNITS/ML VIAL: 100/ML | 22 days supply | Qty: 10 | Fill #6

## 2018-03-07 MED FILL — LISINOPRIL 5 MG TAB: 5 | 30 days supply | Qty: 15 | Fill #1

## 2018-03-07 NOTE — Progress Notes (Addendum)
[facility] [place]  Subjective  - Doing well over all, pending delivery of home wound vac.   Objective 130/74 (!) 111 97.9 F (36.6 C) (Axillary) 19 99%  Intake/Output Summary (Last 24 hours) at 03/07/2018 0728 Last data filed at 03/07/2018 0510 Gross per 24 hour  Intake 0 ml  Output 250 ml  Net -250 ml    Left groin wound vac in place seal is good. LE warm and well perfused Ambulating in room and halls independently Heart RRR Lungs non labored breathing Gen NAD  Assessment/Planning: POD # 4 I & D left groin with wound vac placement Tissue coverage overpatch angioplasty of left SFA  Pending home wound vac Home prescriptions given to the patient include Warfarin 5 mg alt with 10 mg, oxycodone 5 my q 6, Nystatin suspension, Keflex 500 mg QID for 7 days.   Her primary care physician is Karle Plumber at the wellness [facility] I will call their office for INR follow up appt.    Roxy Horseman 03/07/2018 7:28 AM --  Laboratory Lab Results: No results for input(s): WBC, HGB, HCT, PLT in the last 72 hours. BMET No results for input(s): NA, K, CL, CO2, GLUCOSE, BUN, CREATININE, CALCIUM in the last 72 hours.  COAG Lab Results  Component Value Date   INR 2.01 03/07/2018   INR 1.87 03/06/2018   INR 1.43 03/05/2018   No results found for: PTT   I have independently interviewed and examined patient and agree with PA assessment and plan above.   Jameal Razzano C. Donzetta Matters, MD [facility] [place] Office: [phone] Pager: [phone]

## 2018-03-07 NOTE — Progress Notes (Signed)
Spoke with Tommy at [facility] this am- pt has been approved for home wound VAC and it has been released for delivery this AM. Anticipate delivery to pt's room this morning. Bedside RNJuliann Pulse and Patient aware.

## 2018-03-07 NOTE — Plan of Care (Signed)
  Problem: Education: Goal: Knowledge of General Education information will improve Outcome: Progressing   Problem: Health Behavior/Discharge Planning: Goal: Ability to manage health-related needs will improve Outcome: Progressing   Problem: Clinical Measurements: Goal: Will remain free from infection Outcome: Progressing   Problem: Pain Managment: Goal: General experience of comfort will improve Outcome: Progressing   

## 2018-03-07 NOTE — Progress Notes (Signed)
[place] for Warfarin Indication: LLE ischemia/thrombosis  Allergies  Allergen Reactions  . Ciprofloxacin Hcl Hives  . Macrobid [Nitrofurantoin Macrocrystal] Hives, Shortness Of Breath and Rash  . Other Hives, Shortness Of Breath and Rash    NO "-CILLINS"!!!  . Penicillins Shortness Of Breath    Had had cephalosporins without incident  . Sulfa Antibiotics Hives, Shortness Of Breath and Rash  . Lexapro [Escitalopram Oxalate]     I just did not like it.    Patient Measurements: Height: 5' 7"  (170.2 cm) Weight: 286 lb (129.7 kg)(weighed on both scales give me the same numbers) IBW/kg (Calculated) : 61.6 Heparin Dosing Weight: 90 kg  Vital Signs: Temp: 97.9 F (36.6 C) (06/04 0550) Temp Source: Axillary (06/04 0550) BP: 131/76 (06/04 0900)  Labs: Recent Labs    03/05/18 0307 03/06/18 0252 03/07/18 0319  LABPROT 17.3* 21.4* 22.6*  INR 1.43 1.87 2.01   Estimated Creatinine Clearance: 78.8 mL/min (A) (by C-G formula based on SCr of 1.29 mg/dL (H)).  Medical History: Past Medical History:  Diagnosis Date  . Anxiety   . Diabetes ([place])   . GERD (gastroesophageal reflux disease)   . History of blood clots   . Hypercalcemia   . Hyperparathyroidism   . Nephrolithiasis   . Renal calculi    Medications:  No current facility-administered medications on file prior to encounter.    Current Outpatient Medications on File Prior to Encounter  Medication Sig Dispense Refill  . acetaminophen (TYLENOL) 500 MG tablet Take 1,000 mg by mouth every 6 (six) hours as needed for moderate pain.    . naproxen sodium (ALEVE) 220 MG tablet Take 220 mg by mouth daily as needed (pain).    . blood glucose meter kit and supplies Dispense based on patient and insurance preference. Use up to four times daily as directed. (FOR ICD-9 250.00, 250.01). 1 each 0  . Blood Glucose Monitoring Suppl (TRUE METRIX METER) w/Device KIT Use as directed 1 kit 0  .  buPROPion (BUPROBAN) 150 MG 12 hr tablet Take 1 tablet (150 mg total) by mouth 2 (two) times daily. (Patient not taking: Reported on 02/25/2018) 60 tablet 2  . clopidogrel (PLAVIX) 75 MG tablet Take 1 tablet (75 mg total) by mouth daily. (Patient not taking: Reported on 02/25/2018) 30 tablet 10  . ferrous sulfate 325 (65 FE) MG tablet Take 1 tablet (325 mg total) by mouth daily with breakfast. (Patient not taking: Reported on 02/25/2018) 100 tablet 1  . gabapentin (NEURONTIN) 300 MG capsule Take 1 capsule (300 mg total) by mouth 2 (two) times daily. (Patient not taking: Reported on 02/25/2018) 60 capsule 3  . glucose blood (TRUE METRIX BLOOD GLUCOSE TEST) test strip Use as instructed 100 each 12  . glucose blood test strip Use as instructed 100 each 0  . hydrOXYzine (ATARAX/VISTARIL) 25 MG tablet Take 2 tablets (50 mg total) by mouth at bedtime. TAKE 1 TABLET BY MOUTH AT BEDTIME AS NEEDED FOR ANXIETY. (Patient not taking: Reported on 02/25/2018) 60 tablet 2  . insulin aspart (NOVOLOG) 100 UNIT/ML injection 5-15 units subcut TID with meals (Patient not taking: Reported on 02/25/2018) 10 mL 11  . insulin glargine (LANTUS) 100 UNIT/ML injection Inject 0.7 mLs (70 Units total) into the skin daily. (Patient not taking: Reported on 02/25/2018) 10 mL 11  . Insulin Pen Needle 31G X 5 MM MISC Use with Lantus and Novolog injections. 100 each 11  . Insulin Syringe-Needle U-100 (BD INSULIN  SYRINGE ULTRAFINE) 31G X 5/16" 0.5 ML MISC Use as directed 100 each 3  . lisinopril (PRINIVIL,ZESTRIL) 5 MG tablet Take 0.5 tablets (2.5 mg total) by mouth daily. (Patient not taking: Reported on 02/25/2018) 45 tablet 3  . nicotine (NICODERM CQ - DOSED IN MG/24 HOURS) 14 mg/24hr patch Place 1 patch (14 mg total) onto the skin daily. (Patient not taking: Reported on 02/25/2018) 28 patch 1  . omeprazole (PRILOSEC) 20 MG capsule Take 2 capsules (40 mg total) by mouth daily. (Patient not taking: Reported on 02/25/2018) 60 capsule 3  .  sitaGLIPtin (JANUVIA) 50 MG tablet Take 1 tablet (50 mg total) by mouth daily. (Patient not taking: Reported on 02/25/2018) 30 tablet 3  . traZODone (DESYREL) 50 MG tablet Take 0.5 tablets (25 mg total) by mouth at bedtime as needed for sleep. (Patient not taking: Reported on 02/25/2018) 30 tablet 3  . TRUEPLUS LANCETS 28G MISC Use as directed 100 each 6    Assessment:  77 yof with L iliac and popliteal thrombus now s/p OR 5/25 for thromboembolectomy. Pharmacy consulted to transition patient to warfarin with Lovenox bridge. Of note- vitamin K 107m IV given prior to OR for groin washout/debridement, placement of wound vac on 5/31. Noted plans for possible discharge after wound vac change -INR up to 2.01    Goal of Therapy:  INR 2-3 Monitor platelets by anticoagulation protocol: Yes   Plan:  -Coumadin 169mpo tonight  -Would consider discharge on 107mo daily with INR on Friday if possible -Daily INR if remains inpatient  AndHildred LaserharmD Clinical Pharmacist Clinical phone from 8:30-4:00 is [phone] After 4pm, please call Main Rx [phone]) for assistance. 03/07/2018 9:44 AM

## 2018-03-08 LAB — AEROBIC/ANAEROBIC CULTURE W GRAM STAIN (SURGICAL/DEEP WOUND)

## 2018-03-09 ENCOUNTER — Telehealth: Payer: Self-pay | Admitting: Internal Medicine

## 2018-03-09 NOTE — Telephone Encounter (Signed)
Patient called requesting that you put orders in to home health to check INR at home she is using [facility]. Please let patient know what you decided at [phone]

## 2018-03-10 NOTE — Discharge Summary (Signed)
[facility] Discharge Summary   Patient ID:  Terri Sparks MRN: [id number] DOB/AGE: [birth year] 42 y.o.  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 Anxiety   . Diabetes ([place])   . GERD (gastroesophageal reflux disease)   . History of blood clots   . Hypercalcemia   . Hyperparathyroidism   . Nephrolithiasis   . Renal calculi     Procedure(s): WOUND EXPLORATION DEBRIDEMENT WOUND APPLICATION OF WOUND VAC  Discharged Condition: good  HPI:  42 y/o female to the ED with left LE sever pain at rest.  s/p prior L leg TE and L CIA stenting who presents with chief complaint: recurrent left leg pain with skin changes.  Her sx were similar to her presentation in May 2018.   Dr. Bridgett Larsson reviewed the CTA, the L CIA stent is occluded with reconstitution of L femoral flow.  SFA appears patent.  Distal BK pop may be thrombosed, but I have found CTA with Runoff to misdiagnose low flow due to timing.  Plan was attempted with thromboembolectomy of left iliac artery, possible femorofemoral bypass emergently.  PROCEDURE:02/25/2018 by Dr. Bridgett Larsson  1. Redo left femoral exposure 2. Left iliac artery thromboembolectomy 3. Left popliteal artery and anterior tibial artery thromboembolectomy 4. Limited left superficial femoral artery endarterectomy and bovine patch angioplasty 5. Left leg runoff 6. Angioplasty Left common iliac artery (8 mm x 40 mm)  Post op    RIGHT LEFT  ABI 1.19 0.95      She developed left groin drainage with wound breakdown at the incision site.  Post op day 6.     Procedure(s): WOUND EXPLORATION DEBRIDEMENT WOUND APPLICATION OF WOUND  VAC  Wound vac change today.  If wound healing, well without visible patch angioplasty the plan is to discharge home today with a home wound vac. 03/06/2018.   Home prescriptions given to the patient include Warfarin 5 mg alt with 10 mg, oxycodone 5 my q 6, Nystatin suspension, Keflex 500 mg QID for 7 days.   Her primary care physician is Karle Plumber at the wellness center I will call their office for INR follow up appt.        Significant Diagnostic Studies: CBC Lab Results  Component Value Date   WBC 15.5 (H) 03/04/2018   HGB 8.4 (L) 03/04/2018   HCT 28.7 (L) 03/04/2018   MCV 79.1 03/04/2018   PLT 493 (H) 03/04/2018    BMET    Component Value Date/Time   NA 133 (L) 03/04/2018 0356   NA 134 01/27/2018 1157   K 4.3 03/04/2018 0356   CL 98 (L) 03/04/2018 0356   CO2 27 03/04/2018 0356   GLUCOSE 237 (H) 03/04/2018 0356   BUN 16 03/04/2018 0356   BUN 14 01/27/2018 1157   CREATININE 1.29 (H) 03/04/2018 0356   CALCIUM 8.6 (L) 03/04/2018 0356   GFRNONAA 50 (L) 03/04/2018 0356   GFRAA 58 (L) 03/04/2018 0356   COAG Lab Results  Component Value Date   INR 2.01 03/07/2018   INR 1.87 03/06/2018   INR 1.43 03/05/2018     Disposition:  Discharge to :Home Discharge Instructions    Call MD for:  redness, tenderness, or signs of infection (pain, swelling, bleeding, redness, odor or green/yellow discharge around incision  site)   Complete by:  As directed    Call MD for:  redness, tenderness, or signs of infection (pain, swelling, bleeding, redness, odor or green/yellow discharge around incision site)   Complete by:  As directed    Call MD for:  severe or increased pain, loss or decreased feeling  in affected limb(s)   Complete by:  As directed    Call MD for:  severe or increased pain, loss or decreased feeling  in affected limb(s)   Complete by:  As directed    Call MD for:  temperature >100.5   Complete by:  As directed    Call MD for:  temperature >100.5   Complete by:   As directed    Discharge instructions   Complete by:  As directed    Keep wound vac secure and dry.  It is very important you protect this left groin wound.   Resume previous diet   Complete by:  As directed    Resume previous diet   Complete by:  As directed      Allergies as of 03/07/2018      Reactions   Ciprofloxacin Hcl Hives   Macrobid [nitrofurantoin Macrocrystal] Hives, Shortness Of Breath, Rash   Other Hives, Shortness Of Breath, Rash   NO "-CILLINS"!!!   Penicillins Shortness Of Breath   Had had cephalosporins without incident   Sulfa Antibiotics Hives, Shortness Of Breath, Rash   Lexapro [escitalopram Oxalate]    I just did not like it.      Medication List    STOP taking these medications   acetaminophen 500 MG tablet Commonly known as:  TYLENOL     TAKE these medications   blood glucose meter kit and supplies Dispense based on patient and insurance preference. Use up to four times daily as directed. (FOR ICD-9 250.00, 250.01).   buPROPion 150 MG 12 hr tablet Commonly known as:  BUPROBAN Take 1 tablet (150 mg total) by mouth 2 (two) times daily.   cephALEXin 500 MG capsule Commonly known as:  KEFLEX Take 1 capsule (500 mg total) by mouth 4 (four) times daily.   clopidogrel 75 MG tablet Commonly known as:  PLAVIX Take 1 tablet (75 mg total) by mouth daily.   cyclobenzaprine 5 MG tablet Commonly known as:  FLEXERIL Take 1 tablet (5 mg total) by mouth every other day for 2 doses.   ferrous sulfate 325 (65 FE) MG tablet Take 1 tablet (325 mg total) by mouth daily with breakfast.   gabapentin 300 MG capsule Commonly known as:  NEURONTIN Take 1 capsule (300 mg total) by mouth 2 (two) times daily.   glucose blood test strip Use as instructed   glucose blood test strip Commonly known as:  TRUE METRIX BLOOD GLUCOSE TEST Use as instructed   hydrOXYzine 25 MG tablet Commonly known as:  ATARAX/VISTARIL Take 2 tablets (50 mg total) by mouth at bedtime.  TAKE 1 TABLET BY MOUTH AT BEDTIME AS NEEDED FOR ANXIETY.   insulin aspart 100 UNIT/ML injection Commonly known as:  novoLOG 5-15 units subcut TID with meals   insulin glargine 100 UNIT/ML injection Commonly known as:  LANTUS Inject 0.7 mLs (70 Units total) into the skin daily.   Insulin Pen Needle 31G X 5 MM Misc Use with Lantus and Novolog injections.   Insulin Syringe-Needle U-100 31G X 5/16" 0.5 ML Misc Commonly known as:  BD INSULIN SYRINGE ULTRAFINE Use as directed   lisinopril 5 MG tablet Commonly known  as:  PRINIVIL,ZESTRIL Take 0.5 tablets (2.5 mg total) by mouth daily.   LORazepam 1 MG tablet Commonly known as:  ATIVAN Take 1 tablet (1 mg total) by mouth every other day for 2 doses.   naproxen sodium 220 MG tablet Commonly known as:  ALEVE Take 220 mg by mouth daily as needed (pain).   nicotine 14 mg/24hr patch Commonly known as:  NICODERM CQ - dosed in mg/24 hours Place 1 patch (14 mg total) onto the skin daily.   nystatin 100000 UNIT/ML suspension Commonly known as:  MYCOSTATIN Take 5 mLs (500,000 Units total) by mouth 4 (four) times daily.   omeprazole 20 MG capsule Commonly known as:  PRILOSEC Take 2 capsules (40 mg total) by mouth daily.   ondansetron 4 MG tablet Commonly known as:  ZOFRAN Take 1 tablet (4 mg total) by mouth every 8 (eight) hours as needed for nausea or vomiting.   oxyCODONE 5 MG immediate release tablet Commonly known as:  Oxy IR/ROXICODONE Take 1 tablet (5 mg total) by mouth every 6 (six) hours as needed for moderate pain.   sitaGLIPtin 50 MG tablet Commonly known as:  JANUVIA Take 1 tablet (50 mg total) by mouth daily.   traZODone 50 MG tablet Commonly known as:  DESYREL Take 0.5 tablets (25 mg total) by mouth at bedtime as needed for sleep.   TRUE METRIX METER w/Device Kit Use as directed   TRUEPLUS LANCETS 28G Misc Use as directed   warfarin 5 MG tablet Commonly known as:  COUMADIN Take 1 tablet (5 mg total) by mouth  daily.      Verbal and written Discharge instructions given to the patient. Wound care per Discharge AVS Follow-up Information    Waynetta Sandy, MD Follow up in 2 week(s).   Specialties:  Vascular Surgery, Cardiology Why:  office will call Contact information: [place] [address] [place] Follow up.   Specialty:  [place] Why:  [facility] for home wound VAC drsg changes  ([facility] home wound VAC) Contact information: [address] [place] [address] [phone]        Ladell Pier, MD Follow up in 1 week(s).   Specialty:  Internal Medicine Why:  PCP appt. with DR. Johnson Monday the 10th at 9 am. Coumadin clinic appt. Thursday 8 am Contact information: [place] [address] [phone]           Signed: Roxy Horseman 03/10/2018, 11:30 AM

## 2018-03-10 NOTE — Telephone Encounter (Signed)
Contacted pt and informed her of Dr. Wynetta Emery response and I have spoke with Lurena Joiner and he will see pt on Monday for her INR and I have made Ena Dawley aware because pt see Ena Dawley on Monday for hospital f/u

## 2018-03-13 ENCOUNTER — Inpatient Hospital Stay: Payer: Disability Insurance

## 2018-03-14 ENCOUNTER — Encounter: Payer: Self-pay | Admitting: Internal Medicine

## 2018-03-14 ENCOUNTER — Ambulatory Visit: Payer: Self-pay | Attending: Internal Medicine | Admitting: Internal Medicine

## 2018-03-14 ENCOUNTER — Other Ambulatory Visit: Payer: Self-pay | Admitting: *Deleted

## 2018-03-14 VITALS — BP 144/91 | HR 100 | Resp 16 | Wt 280.6 lb

## 2018-03-14 DIAGNOSIS — Z794 Long term (current) use of insulin: Secondary | ICD-10-CM | POA: Insufficient documentation

## 2018-03-14 DIAGNOSIS — Z880 Allergy status to penicillin: Secondary | ICD-10-CM | POA: Insufficient documentation

## 2018-03-14 DIAGNOSIS — E213 Hyperparathyroidism, unspecified: Secondary | ICD-10-CM | POA: Insufficient documentation

## 2018-03-14 DIAGNOSIS — I779 Disorder of arteries and arterioles, unspecified: Secondary | ICD-10-CM

## 2018-03-14 DIAGNOSIS — IMO0001 Reserved for inherently not codable concepts without codable children: Secondary | ICD-10-CM

## 2018-03-14 DIAGNOSIS — K219 Gastro-esophageal reflux disease without esophagitis: Secondary | ICD-10-CM | POA: Insufficient documentation

## 2018-03-14 DIAGNOSIS — F32A Depression, unspecified: Secondary | ICD-10-CM

## 2018-03-14 DIAGNOSIS — F419 Anxiety disorder, unspecified: Secondary | ICD-10-CM

## 2018-03-14 DIAGNOSIS — Z79899 Other long term (current) drug therapy: Secondary | ICD-10-CM | POA: Insufficient documentation

## 2018-03-14 DIAGNOSIS — Z87891 Personal history of nicotine dependence: Secondary | ICD-10-CM | POA: Insufficient documentation

## 2018-03-14 DIAGNOSIS — F17210 Nicotine dependence, cigarettes, uncomplicated: Secondary | ICD-10-CM | POA: Insufficient documentation

## 2018-03-14 DIAGNOSIS — Z881 Allergy status to other antibiotic agents status: Secondary | ICD-10-CM | POA: Insufficient documentation

## 2018-03-14 DIAGNOSIS — E1151 Type 2 diabetes mellitus with diabetic peripheral angiopathy without gangrene: Secondary | ICD-10-CM | POA: Insufficient documentation

## 2018-03-14 DIAGNOSIS — Z882 Allergy status to sulfonamides status: Secondary | ICD-10-CM | POA: Insufficient documentation

## 2018-03-14 DIAGNOSIS — Z888 Allergy status to other drugs, medicaments and biological substances status: Secondary | ICD-10-CM | POA: Insufficient documentation

## 2018-03-14 DIAGNOSIS — I998 Other disorder of circulatory system: Secondary | ICD-10-CM

## 2018-03-14 DIAGNOSIS — Z7901 Long term (current) use of anticoagulants: Secondary | ICD-10-CM | POA: Insufficient documentation

## 2018-03-14 DIAGNOSIS — E1165 Type 2 diabetes mellitus with hyperglycemia: Secondary | ICD-10-CM | POA: Insufficient documentation

## 2018-03-14 DIAGNOSIS — F329 Major depressive disorder, single episode, unspecified: Secondary | ICD-10-CM | POA: Insufficient documentation

## 2018-03-14 MED ORDER — OXYCODONE HCL 5 MG PO TABS: 5.0000 mg | ORAL_TABLET | Freq: Four times a day (QID) | ORAL | 0 refills | Status: AC | PRN

## 2018-03-14 MED ORDER — HYDROXYZINE HCL 25 MG PO TABS: ORAL_TABLET | ORAL | 2 refills | Status: DC

## 2018-03-14 MED ORDER — LIRAGLUTIDE 18 MG/3ML ~~LOC~~ SOPN: 0.6000 mg | PEN_INJECTOR | Freq: Every day | SUBCUTANEOUS | 3 refills | Status: DC

## 2018-03-14 MED ORDER — INSULIN GLARGINE 100 UNIT/ML ~~LOC~~ SOLN: 77.0000 [IU] | Freq: Every day | SUBCUTANEOUS | 11 refills | Status: DC

## 2018-03-14 NOTE — Patient Instructions (Addendum)
Schedule an appointment with Terri Sparks the clinical pharmacist next week on March 22, 2018  Take 2 tablets of Coumadin on  M-W-F with 1 tablet all other days

## 2018-03-14 NOTE — Progress Notes (Signed)
Patient ID: OTILA STARN, female    DOB: 06-Jul-1976  MRN: [id number]  CC: Hospitalization Follow-up   Subjective: Terri Sparks is a 42 y.o. female who presents for hosp f/u Her concerns today include:  42 year old female with history of DM type 2 with microalbumin, tob dep, PVD with hx LLE acute limb ischemia requiring stent to the iliac artery and embolectomy followed by Dr. Zenia Resides vascular surgery, hyperparathyroidism s/p resection of parathryroid adenoma 2010, GERD, IDA, and anxiety/dep  Patient hospitalized 5/24- 03/07/2018 with recurrent acute ischemic limb left leg.  She was found to have arterial occlusion and thromboembolism of the left iliac, popliteal and superficial femoral arteries.  She underwent thromboembolectomy of the left iliac and popliteal arteries and endarterectomy with angioplasty of the left superficial femoral artery.  She developed infection to the left groin surgical incision.  Wound VAC was placed.  She was discharged home with nurse check of wound VAC 3 times a week.  She was also discharged home on warfarin 5 mg Tuesday Thursday and Saturdays and 10 mg Monday Wednesday Fridays with no dose on Sundays.  She was seen by our clinical pharmacist for Coumadin clinic here today.  Her INR was 1.9.  She reports no bruising or bleeding.  -She is wondering about being checked for factor V Leiden or other thrombophilia given the recurrent clot.  She reports her paternal uncle calling her several years ago after her dad died informing her to be checked for clotting disease.  She is not sure whether her father or uncle had problems with recurrent arterial or venous blood clots. -She has quit smoking  Diabetes: Lantus was increased to 77 units daily.  She has been taking NovoLog 5 units with at least one of her daily meals. She has log of blood sugars for the past several days.  Morning blood sugars have been 1 27-1 58, before lunch 1 25-3 50 and before dinner 150s to 170s.  She  never did get Januvia.  Stomach issues resolved once we discontinued metformin. she would like to be tried with Victoza if it will help with some weight loss.  Anxiety: Was receiving Ativan while in the hospital.  She finds hydroxyzine helpful and wonders whether she can take a dose during the day as well as at night.   Patient Active Problem List   Diagnosis Date Noted  . Former smoker 03/14/2018  . 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 Thromboembolism ([place]) 02/25/2018  . Microalbuminuria 09/29/2017  . Insomnia 09/29/2017  . Immunization due 08/01/2017  . Anxiety and depression 04/28/2017  . 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 Iliac artery occlusion, left ([place]) 02/25/2017  . Tobacco abuse 02/25/2017  . Diabetes mellitus, type II ([place]) 02/25/2017  . Peripheral vascular disease of lower extremity ([place]) 02/24/2017  . GERD (gastroesophageal reflux disease)   . Hyperparathyroidism      Current Outpatient Medications on File Prior to Visit  Medication Sig Dispense Refill  . blood glucose meter kit and supplies Dispense based on patient and insurance preference. Use up to four times daily as directed. (FOR ICD-9 250.00, 250.01). 1 each 0  . Blood Glucose Monitoring Suppl (TRUE METRIX METER) w/Device KIT Use as directed 1 kit 0  . buPROPion (BUPROBAN) 150 MG 12 hr tablet Take 1 tablet (150 mg total) by mouth 2 (two) times daily. (Patient not taking: Reported on 02/25/2018) 60 tablet 2  . cephALEXin (KEFLEX) 500 MG capsule Take 1 capsule (  500 mg total) by mouth 4 (four) times daily. 28 capsule 0  . clopidogrel (PLAVIX) 75 MG tablet Take 1 tablet (75 mg total) by mouth daily. (Patient not taking: Reported on 02/25/2018) 30 tablet 10  . ferrous sulfate 325 (65 FE) MG tablet Take 1 tablet (325 mg total) by mouth daily with breakfast. (Patient not taking: Reported on 02/25/2018) 100 tablet 1  . gabapentin (NEURONTIN) 300 MG capsule Take 1 capsule (300 mg total) by mouth 2  (two) times daily. (Patient not taking: Reported on 02/25/2018) 60 capsule 3  . glucose blood (TRUE METRIX BLOOD GLUCOSE TEST) test strip Use as instructed 100 each 12  . glucose blood test strip Use as instructed 100 each 0  . hydrOXYzine (ATARAX/VISTARIL) 25 MG tablet Take 2 tablets (50 mg total) by mouth at bedtime. TAKE 1 TABLET BY MOUTH AT BEDTIME AS NEEDED FOR ANXIETY. (Patient not taking: Reported on 02/25/2018) 60 tablet 2  . insulin aspart (NOVOLOG) 100 UNIT/ML injection 5-15 units subcut TID with meals (Patient not taking: Reported on 02/25/2018) 10 mL 11  . insulin glargine (LANTUS) 100 UNIT/ML injection Inject 0.7 mLs (70 Units total) into the skin daily. (Patient not taking: Reported on 02/25/2018) 10 mL 11  . Insulin Pen Needle 31G X 5 MM MISC Use with Lantus and Novolog injections. 100 each 11  . Insulin Syringe-Needle U-100 (BD INSULIN SYRINGE ULTRAFINE) 31G X 5/16" 0.5 ML MISC Use as directed 100 each 3  . lisinopril (PRINIVIL,ZESTRIL) 5 MG tablet Take 0.5 tablets (2.5 mg total) by mouth daily. (Patient not taking: Reported on 02/25/2018) 45 tablet 3  . naproxen sodium (ALEVE) 220 MG tablet Take 220 mg by mouth daily as needed (pain).    . nicotine (NICODERM CQ - DOSED IN MG/24 HOURS) 14 mg/24hr patch Place 1 patch (14 mg total) onto the skin daily. (Patient not taking: Reported on 02/25/2018) 28 patch 1  . nystatin (MYCOSTATIN) 100000 UNIT/ML suspension Take 5 mLs (500,000 Units total) by mouth 4 (four) times daily. 60 mL 0  . omeprazole (PRILOSEC) 20 MG capsule Take 2 capsules (40 mg total) by mouth daily. (Patient not taking: Reported on 02/25/2018) 60 capsule 3  . ondansetron (ZOFRAN) 4 MG tablet Take 1 tablet (4 mg total) by mouth every 8 (eight) hours as needed for nausea or vomiting. 20 tablet 0  . TRUEPLUS LANCETS 28G MISC Use as directed 100 each 6  . warfarin (COUMADIN) 5 MG tablet Take 1 tablet (5 mg total) by mouth daily. 90 tablet 0   No current facility-administered  medications on file prior to visit.     Allergies  Allergen Reactions  . Ciprofloxacin Hcl Hives  . Macrobid [Nitrofurantoin Macrocrystal] Hives, Shortness Of Breath and Rash  . Other Hives, Shortness Of Breath and Rash    NO "-CILLINS"!!!  . Penicillins Shortness Of Breath    Had had cephalosporins without incident  . Sulfa Antibiotics Hives, Shortness Of Breath and Rash  . Lexapro [Escitalopram Oxalate]     I just did not like it.    Social History   Socioeconomic History  . Marital status: Single    Spouse name: Not on file  . Number of children: Not on file  . Years of education: Not on file  . Highest education level: Not on file  Occupational History  . Not on file  Social Needs  . Financial resource strain: Not on file  . Food insecurity:    Worry: Not on file  Inability: Not on file  . Transportation needs:    Medical: Not on file    Non-medical: Not on file  Tobacco Use  . Smoking status: Current Some Day Smoker    Packs/day: 0.25    Years: 15.00    Pack years: 3.75    Types: Cigarettes    Last attempt to quit: 03/04/2017    Years since quitting: 1.0  . Smokeless tobacco: Never Used  Substance and Sexual Activity  . Alcohol use: No  . Drug use: Yes    Types: Marijuana    Comment: occasionally  . Sexual activity: Not on file  Lifestyle  . Physical activity:    Days per week: Not on file    Minutes per session: Not on file  . Stress: Not on file  Relationships  . Social connections:    Talks on phone: Not on file    Gets together: Not on file    Attends religious service: Not on file    Active member of club or organization: Not on file    Attends meetings of clubs or organizations: Not on file    Relationship status: Not on file  . Intimate partner violence:    Fear of current or ex partner: Not on file    Emotionally abused: Not on file    Physically abused: Not on file    Forced sexual activity: Not on file  Other Topics Concern  . Not on  file  Social History Narrative  . Not on file    Family History  Problem Relation Age of Onset  . Depression Mother   . Diabetes Father   . Heart failure Father     Past Surgical History:  Procedure Laterality Date  . ABDOMINAL AORTOGRAM W/LOWER EXTREMITY N/A 11/14/2017   Procedure: ABDOMINAL AORTOGRAM W/LOWER EXTREMITY;  Surgeon: Waynetta Sandy, MD;  Location: [facility];  Service: Cardiovascular;  Laterality: N/A;  . ANGIOPLASTY ILLIAC ARTERY Left 02/25/2018   Procedure: BALLOON ANGIOPLASTY LEFT ILIAC ARTERY;  Surgeon: Conrad [place], MD;  Location: [facility];  Service: Vascular;  Laterality: Left;  . APPLICATION OF WOUND VAC Left 03/03/2018   Procedure: APPLICATION OF WOUND VAC;  Surgeon: Angelia Mould, MD;  Location: [facility];  Service: Vascular;  Laterality: Left;  . EMBOLECTOMY Left 02/24/2017   Procedure: Left Lower Extremity Embolectomy and Angiogram.;  Surgeon: Waynetta Sandy, MD;  Location: [facility];  Service: Vascular;  Laterality: Left;  . INSERTION OF ILIAC STENT  02/24/2017   Procedure: INSERTION OF Common ILIAC STENT;  Surgeon: Waynetta Sandy, MD;  Location: [facility];  Service: Vascular;;  . INTRAOPERATIVE ARTERIOGRAM Left 02/25/2018   Procedure: INTRA OPERATIVE ARTERIOGRAM WITH LEFT LEG RUNOFF;  Surgeon: Conrad [place], MD;  Location: [facility];  Service: Vascular;  Laterality: Left;  . LOWER EXTREMITY ANGIOGRAM Left 02/24/2017   Procedure: Aortagram, Left lower extremity Run-off;  Surgeon: Waynetta Sandy, MD;  Location: [facility];  Service: Vascular;  Laterality: Left;  . PATCH ANGIOPLASTY Left 02/25/2018   Procedure: PATCH ANGIOPLASTY LEFT SUPERFICIAL FEMORAL ARTERY WITH BOVINE PATCH;  Surgeon: Conrad [place], MD;  Location: [facility];  Service: Vascular;  Laterality: Left;  . 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 THROMBECTOMY FEMORAL ARTERY Left 02/25/2018   Procedure: LEFT ILIAC AND POPLITEAL ARTERY THROMBECTOMY;  Surgeon: Conrad [place],  MD;  Location: [facility];  Service: Vascular;  Laterality: Left;  . TUBAL LIGATION    . WOUND  DEBRIDEMENT Left 03/03/2018   Procedure: DEBRIDEMENT WOUND;  Surgeon: Angelia Mould, MD;  Location: [facility];  Service: Vascular;  Laterality: Left;  . WOUND EXPLORATION Left 03/03/2018   Procedure: WOUND EXPLORATION;  Surgeon: Angelia Mould, MD;  Location: [facility];  Service: Vascular;  Laterality: Left;    ROS: Review of Systems Negative except as stated above PHYSICAL EXAM: BP (!) 144/91   Pulse 100   Resp 16   Wt 280 lb 9.6 oz (127.3 kg)   SpO2 98%   BMI 43.95 kg/m   Physical Exam  General appearance - alert, well appearing, and in no distress Mental status - alert, oriented to person, place, and time, normal mood, behavior, speech, dress, motor activity, and thought processes Chest - clear to auscultation, no wheezes, rales or rhonchi, symmetric air entry Heart - normal rate, regular rhythm, normal S1, S2, no murmurs, rubs, clicks or gallops Extremities -no LE edema   ASSESSMENT AND PLAN: 1. Limb ischemia -Patient will keep follow-up appointment with Dr. Gwenlyn Saran. -She has seen clinical pharmacist today.  Warfarin dose adjusted to 10 mg Monday Wednesday Friday and 5 mg all other days. -In regards to her request to be check for thrombophilia, most are associated with recurrent VTE but some also are associated with arterial embolism.  Not a good time to check right now because she is on warfarin. -When she has the orange card/cone discount card we can refer to hematology for an opinion on this  2. Uncontrolled type 2 diabetes mellitus without complication, with long-term current use of insulin (HCC) Continue Lantus 77 units daily. Discontinue Januvia since she never got it filled. Add Victoza.  I went over how the medicine works and possible side effects of nausea.  3. Tobacco dependence Commended her on quitting.  Encouraged her to remain tobacco free  4. Anxiety Increase  hydroxyzine to 25 mg morning and afternoon as needed and 50 mg at night  Patient was given the opportunity to ask questions.  Patient verbalized understanding of the plan and was able to repeat key elements of the plan.   No orders of the defined types were placed in this encounter.    Requested Prescriptions   Pending Prescriptions Disp Refills  . insulin glargine (LANTUS) 100 UNIT/ML injection 10 mL 11    Sig: Inject 0.77 mLs (77 Units total) into the skin daily.     Karle Plumber, MD, FACP

## 2018-03-15 LAB — POCT INR: INR: 1.9 — AB (ref 2.0–3.0)

## 2018-03-15 LAB — GLUCOSE, POCT (MANUAL RESULT ENTRY): POC Glucose: 133 mg/dl — AB (ref 70–99)

## 2018-03-15 MED FILL — hydrOXYzine HCL 25 MG TABS: 25 | 30 days supply | Qty: 120 | Fill #0

## 2018-03-15 MED FILL — !VICTOZA 18MG/3ML INJECT: 18 | 30 days supply | Qty: 3 | Fill #0

## 2018-03-24 ENCOUNTER — Encounter: Payer: Self-pay | Admitting: Pharmacist

## 2018-03-24 ENCOUNTER — Ambulatory Visit: Payer: Self-pay | Attending: Internal Medicine | Admitting: Pharmacist

## 2018-03-24 ENCOUNTER — Ambulatory Visit (INDEPENDENT_AMBULATORY_CARE_PROVIDER_SITE_OTHER): Payer: Self-pay | Admitting: Physician Assistant

## 2018-03-24 VITALS — BP 115/77 | HR 114 | Resp 16 | Ht 67.0 in | Wt 271.0 lb

## 2018-03-24 DIAGNOSIS — I779 Disorder of arteries and arterioles, unspecified: Secondary | ICD-10-CM

## 2018-03-24 DIAGNOSIS — I745 Embolism and thrombosis of iliac artery: Secondary | ICD-10-CM | POA: Insufficient documentation

## 2018-03-24 DIAGNOSIS — I749 Embolism and thrombosis of unspecified artery: Secondary | ICD-10-CM | POA: Insufficient documentation

## 2018-03-24 DIAGNOSIS — I739 Peripheral vascular disease, unspecified: Secondary | ICD-10-CM

## 2018-03-24 LAB — POCT INR: INR: 2.8 (ref 2.0–3.0)

## 2018-03-24 MED ORDER — ONDANSETRON HCL 4 MG PO TABS: 4.0000 mg | ORAL_TABLET | Freq: Three times a day (TID) | ORAL | 0 refills | Status: DC | PRN

## 2018-03-24 MED ORDER — TRAMADOL HCL 50 MG PO TABS: 50.0000 mg | ORAL_TABLET | Freq: Four times a day (QID) | ORAL | 0 refills | Status: DC | PRN

## 2018-03-24 NOTE — Progress Notes (Signed)
    Pharmacy Anticoagulation Clinic  Subjective: Patient presents today for INR monitoring. Anticoagulation indication is DVT.     Current dose of warfarin:  - 10 mg all Monday, Wednesday, and Friday with 5 mg all other days - Weekly dosing: 50 mg  Adherence to warfarin: yes  Signs/symptoms of bleeding: no Recent changes in diet: no Recent changes in medications: no Upcoming procedures that may impact anticoagulation: none   Objective: Today's INR = 2.8  Lab Results  Component Value Date   INR 1.9 (A) 03/15/2018   INR 2.01 03/07/2018   INR 1.87 03/06/2018     Assessment and Plan: Anticoagulation: Patient is Therapeutic based on patient's INR of 2.8 and patient's INR goal of 2-3. Will continue current warfarin dose: 10 mg MWF, 5 mg all other days.   Patient verbalized understanding and was provided with written instructions. Next INR check planned in ~1 week.   Benard Halsted, PharmD, [place] [phone]

## 2018-03-24 NOTE — Progress Notes (Signed)
POST OPERATIVE OFFICE NOTE    CC:  F/u for surgery  HPI:  This is a 42 y.o. female who is s/p left lower extremity thromboembolectomy with patch angioplasty of left SFA and balloon angioplasty left common iliac artery stent/25/2019 by Dr. Bridgett Larsson.   Post op ABI's showed right biphasic flow with 1.19 and TBI 0.89.     She developed some separation of her wound with drainage in the left groin area and on 03/03/2018 Dr. Scot Dock took her to the OR for irrigation and exploration.  She was discharged home with a wound vac and home health RN for wound checks.  She denise fever and chills.  She continue to have sharp tingling pains in her foot and has had her primary care physician prescribe her Neurontin.  The wound vac changes cause her pain still , but it is getting a little easier with pain medication.      Allergies  Allergen Reactions  . Ciprofloxacin Hcl Hives  . Macrobid [Nitrofurantoin Macrocrystal] Hives, Shortness Of Breath and Rash  . Other Hives, Shortness Of Breath and Rash    NO "-CILLINS"!!!  . Penicillins Shortness Of Breath    Had had cephalosporins without incident  . Sulfa Antibiotics Hives, Shortness Of Breath and Rash  . Lexapro [Escitalopram Oxalate]     I just did not like it.    Current Outpatient Medications  Medication Sig Dispense Refill  . blood glucose meter kit and supplies Dispense based on patient and insurance preference. Use up to four times daily as directed. (FOR ICD-9 250.00, 250.01). 1 each 0  . Blood Glucose Monitoring Suppl (TRUE METRIX METER) w/Device KIT Use as directed 1 kit 0  . buPROPion (BUPROBAN) 150 MG 12 hr tablet Take 1 tablet (150 mg total) by mouth 2 (two) times daily. 60 tablet 2  . clopidogrel (PLAVIX) 75 MG tablet Take 1 tablet (75 mg total) by mouth daily. 30 tablet 10  . ferrous sulfate 325 (65 FE) MG tablet Take 1 tablet (325 mg total) by mouth daily with breakfast. 100 tablet 1  . gabapentin (NEURONTIN) 300 MG capsule Take 1 capsule  (300 mg total) by mouth 2 (two) times daily. 60 capsule 3  . glucose blood (TRUE METRIX BLOOD GLUCOSE TEST) test strip Use as instructed 100 each 12  . glucose blood test strip Use as instructed 100 each 0  . hydrOXYzine (ATARAX/VISTARIL) 25 MG tablet 1 tab PO Q a.m, 1 Q noon and 2 Q p.m PRN 120 tablet 2  . insulin aspart (NOVOLOG) 100 UNIT/ML injection 5-15 units subcut TID with meals 10 mL 11  . insulin glargine (LANTUS) 100 UNIT/ML injection Inject 0.77 mLs (77 Units total) into the skin daily. 10 mL 11  . Insulin Pen Needle 31G X 5 MM MISC Use with Lantus and Novolog injections. 100 each 11  . Insulin Syringe-Needle U-100 (BD INSULIN SYRINGE ULTRAFINE) 31G X 5/16" 0.5 ML MISC Use as directed 100 each 3  . lisinopril (PRINIVIL,ZESTRIL) 5 MG tablet Take 0.5 tablets (2.5 mg total) by mouth daily. 45 tablet 3  . naproxen sodium (ALEVE) 220 MG tablet Take 220 mg by mouth daily as needed (pain).    . nicotine (NICODERM CQ - DOSED IN MG/24 HOURS) 14 mg/24hr patch Place 1 patch (14 mg total) onto the skin daily. 28 patch 1  . nystatin (MYCOSTATIN) 100000 UNIT/ML suspension Take 5 mLs (500,000 Units total) by mouth 4 (four) times daily. 60 mL 0  . omeprazole (PRILOSEC) 20  MG capsule Take 2 capsules (40 mg total) by mouth daily. 60 capsule 3  . ondansetron (ZOFRAN) 4 MG tablet Take 1 tablet (4 mg total) by mouth every 8 (eight) hours as needed for nausea or vomiting. 20 tablet 0  . oxyCODONE (OXY IR/ROXICODONE) 5 MG immediate release tablet Take 1 tablet (5 mg total) by mouth every 6 (six) hours as needed for up to 10 days for moderate pain. 30 tablet 0  . TRUEPLUS LANCETS 28G MISC Use as directed 100 each 6  . warfarin (COUMADIN) 5 MG tablet Take 1 tablet (5 mg total) by mouth daily. 90 tablet 0  . liraglutide (VICTOZA) 18 MG/3ML SOPN Inject 0.1 mLs (0.6 mg total) into the skin daily. (Patient not taking: Reported on 03/24/2018) 3 pen 3  . traMADol (ULTRAM) 50 MG tablet Take 1 tablet (50 mg total) by  mouth every 6 (six) hours as needed. 30 tablet 0   No current facility-administered medications for this visit.      ROS:  See HPI  Physical Exam:  Vitals:   03/24/18 1509  BP: 115/77  Pulse: (!) 114  Resp: 16  SpO2: 99%    Incision:  Left groin with good beefy red granulation tissue length 4 cm x width 3 cm x 2.5 cm deep.  No visible patch or femoral artety at base of wound. Extremities:  Palpable DP pulse      Assessment/Plan:  This is a 42 y.o. female who is s/p: left lower extremity thromboembolectomy with patch angioplasty of left SFA and balloon angioplasty left common iliac artery stent 03/28/2018 followed by irrigation and debridement of left groin with placement of wound vac.  We will continue the wound van.  She came in today with wet to dry dressing and the wound is still producing SS fluid.  I feel it will continue to heal nicely with the wound vac.    She will f/u in 3 weeks for a wound check.  She will need repeat ABI's and arterial duplex in Sept. 2019.    Continue Coumadin and Plavix.     Roxy Horseman , PA-C [facility] [phone]

## 2018-03-24 NOTE — Patient Instructions (Addendum)
Thank you for coming to see me today. Please do the following:   1. Continue taking warfarin just as you have been. Take 2 tablets Mondays, Wednesdays, and Fridays with 1 tablet all other days.This includes the following schedule:   Mondays: take 2 tablets daily   Tuesdays: take 1 tablet  Wednesdays: take 2 tablets daily  Thursdays: take 1 tablet   Fridays: take 2 tablets daily  Saturday: take 1 tablet   Sunday: take 1 tablet   2. Continue monitoring for signs/symptoms of bleeding/bruising.   3. I will see you in ~1 week. If you are therapeutic, I can space your visits out a bit.

## 2018-03-29 ENCOUNTER — Other Ambulatory Visit: Payer: Self-pay | Admitting: Physician Assistant

## 2018-03-29 ENCOUNTER — Other Ambulatory Visit: Payer: Self-pay | Admitting: Pharmacist

## 2018-03-29 MED ORDER — WARFARIN SODIUM 5 MG PO TABS: ORAL_TABLET | ORAL | 3 refills | Status: DC

## 2018-03-29 MED ORDER — TRAMADOL HCL 50 MG PO TABS: 50.0000 mg | ORAL_TABLET | Freq: Four times a day (QID) | ORAL | 0 refills | Status: DC | PRN

## 2018-03-29 MED FILL — WARFARIN SODIUM 5 MG TABLET: 5 | 30 days supply | Qty: 60 | Fill #0

## 2018-03-29 MED FILL — ONDANSETRON HCL 4 MG TABLET: 4 | 6 days supply | Qty: 20 | Fill #0

## 2018-03-29 MED FILL — traMADol HCL 50 MG TABS: 50 | 5 days supply | Qty: 20 | Fill #0

## 2018-03-29 NOTE — Progress Notes (Signed)
Nurse received call that pt would like to have Tr[facility]adol [facility] due to cost.  It was not able to be transferred from her regular pharmacy so new Rx sent electronically to [facility] 50mg  q6h prn #20 (twenty) NR.  Leontine Locket, [facility] 03/29/2018 4:08 PM

## 2018-04-04 ENCOUNTER — Other Ambulatory Visit: Payer: Self-pay | Admitting: Internal Medicine

## 2018-04-04 ENCOUNTER — Other Ambulatory Visit: Payer: Self-pay | Admitting: Physician Assistant

## 2018-04-04 ENCOUNTER — Encounter: Payer: Medicaid Other | Admitting: Pharmacist

## 2018-04-04 DIAGNOSIS — G5792 Unspecified mononeuropathy of left lower limb: Secondary | ICD-10-CM

## 2018-04-04 MED FILL — OMEPRAZOLE 20 MG CAP: 20 | 30 days supply | Qty: 60 | Fill #3

## 2018-04-04 MED FILL — $LANTUS 100 UNITS/ML VIAL: 100 | 28 days supply | Qty: 20 | Fill #2

## 2018-04-04 MED FILL — FERROUS SULFATE 325 MG TAB: 325 (65 FE) | 30 days supply | Qty: 30 | Fill #1

## 2018-04-04 MED FILL — CLOPIDOGREL 75 MG TABLET: 75 | 30 days supply | Qty: 30 | Fill #4

## 2018-04-04 MED FILL — !NOVOLOG 100UNITS/ML VIAL: 100/ML | 22 days supply | Qty: 10 | Fill #7

## 2018-04-04 MED FILL — BUPROPION SR 150 MG TABLET: 150 | 30 days supply | Qty: 60 | Fill #1

## 2018-04-04 MED FILL — LISINOPRIL 5 MG TAB: 5 | 30 days supply | Qty: 15 | Fill #2

## 2018-04-05 MED FILL — GABAPENTIN 300 MG CAPSULE: 300 | 30 days supply | Qty: 60 | Fill #3

## 2018-04-07 ENCOUNTER — Ambulatory Visit: Payer: Self-pay | Attending: Internal Medicine | Admitting: Pharmacist

## 2018-04-07 ENCOUNTER — Encounter: Payer: Self-pay | Admitting: Pharmacist

## 2018-04-07 DIAGNOSIS — I749 Embolism and thrombosis of unspecified artery: Secondary | ICD-10-CM

## 2018-04-07 DIAGNOSIS — Z7901 Long term (current) use of anticoagulants: Secondary | ICD-10-CM | POA: Insufficient documentation

## 2018-04-07 DIAGNOSIS — I745 Embolism and thrombosis of iliac artery: Secondary | ICD-10-CM | POA: Insufficient documentation

## 2018-04-07 LAB — POCT INR: INR: 2.9 (ref 2.0–3.0)

## 2018-04-07 NOTE — Progress Notes (Signed)
    Pharmacy Anticoagulation Clinic  Subjective: Patient presents today for INR monitoring. Anticoagulation indication is DVT. Pt with recent thromboembolectomy. She sees vein/vascular surgery 04/21/18.   Current dose of warfarin:  - 10 mg (2 tabs) MWF - 5 mg (1 tab) all other days - 50 mg weekly dose  Adherence to warfarin: yes Signs/symptoms of bleeding: some bruising. I was able to visualize 1 bruise on left arm that looked to be healing/normal. Pt denies any pain and states that the bruising is healing/improving in appearance.  Recent changes in diet: none Recent changes in medications: none Upcoming procedures that may impact anticoagulation: none   Objective: Today's INR = 2.9  Lab Results  Component Value Date   INR 2.8 03/24/2018   INR 1.9 (A) 03/15/2018   INR 2.01 03/07/2018     Assessment and Plan: Anticoagulation: Patient is Therapeutic based on patient's INR of 2.9 and patient's INR goal of 2-3. Will continue current warfarin dose: 10 mg MWF with 5 mg all other days.  Bruising reported by patient. States that she wants to make me aware but she is not further concerned at this time. Additionally, she states that she bruises more often but these are healing. Will continue to monitor. I have instructed her to make me aware of any bruising different than her baseline.  Patient verbalized understanding and was provided with written instructions. Next INR check planned for 05/05/18.   Benard Halsted, PharmD, [place] [phone]

## 2018-04-07 NOTE — Patient Instructions (Signed)
Thank you for coming to see me today.   Please continue to take 2 tablets on Mondays, Wednesdays, and Fridays. Take 1 tablet all other days.  If you need any refills, give me a call at [phone].  I will need to see you again in 3-4 weeks.

## 2018-04-07 NOTE — Telephone Encounter (Signed)
Pt came in to request a medication refill on  -traMADol (ULTRAM) 50 MG tablet  To  -[facility] pharmacy, please follow up

## 2018-04-10 MED FILL — traMADol HCL 50 MG TABS: 50 | 5 days supply | Qty: 20 | Fill #0

## 2018-04-10 MED FILL — hydrOXYzine HCL 25 MG TABS: 25 | 30 days supply | Qty: 120 | Fill #1

## 2018-04-14 ENCOUNTER — Ambulatory Visit: Payer: PRIVATE HEALTH INSURANCE | Admitting: Vascular Surgery

## 2018-04-14 ENCOUNTER — Encounter (HOSPITAL_COMMUNITY): Payer: PRIVATE HEALTH INSURANCE

## 2018-04-20 ENCOUNTER — Ambulatory Visit: Payer: Medicaid Other | Admitting: Internal Medicine

## 2018-04-21 ENCOUNTER — Encounter: Payer: Self-pay | Admitting: Physician Assistant

## 2018-04-21 ENCOUNTER — Ambulatory Visit (INDEPENDENT_AMBULATORY_CARE_PROVIDER_SITE_OTHER): Payer: Self-pay | Admitting: Physician Assistant

## 2018-04-21 VITALS — BP 128/76 | HR 100 | Temp 99.0°F | Resp 18 | Ht 67.0 in | Wt 281.0 lb

## 2018-04-21 DIAGNOSIS — I739 Peripheral vascular disease, unspecified: Secondary | ICD-10-CM

## 2018-04-21 MED ORDER — TRAMADOL HCL 50 MG PO TABS: 50.0000 mg | ORAL_TABLET | Freq: Four times a day (QID) | ORAL | 0 refills | Status: DC | PRN

## 2018-04-21 NOTE — Progress Notes (Signed)
    Postoperative Visit   History of Present Illness   Terri Sparks is a 42 y.o. year old female who presents for postoperative follow-up for: Left iliac, popliteal, and and anterior tibial artery thromboembolectomy with limited left SFA endarterectomy and bovine patch angioplasty and angioplasty of left common iliac artery by Dr. Bridgett Larsson on 02/25/2018 due to recurrent thrombosis.  She then underwent incision and drainage of lymphocele of left groin by Dr. Scot Dock on 03/03/2018.  She was discharged from the hospital with wound VAC.  She says L groin incision healing well.  She discontinued wound vac about 2 weeks ago.  She is wondering about her heavy menstrual bleeding and this relation to her anticoagulation.  She is taking plavix, aspirin, and coumadin daily.   For VQI Use Only   PRE-ADM LIVING: Home  AMB STATUS: Ambulatory   Physical Examination   Vitals:   04/21/18 1609  BP: 128/76  Pulse: 100  Resp: 18  Temp: 99 F (37.2 C)  TempSrc: Oral  SpO2: 98%  Weight: 281 lb (127.5 kg)  Height: 5\' 7"  (1.702 m)    LLE: Incision is nearly healed with about 53mm long x 40mm wide x 39mm deep wound, healthy wound bed, no purulence;  Palpable L DP pulse   Medical Decision Making   Terri Sparks is a 42 y.o. year old female who presents for wound check s/p Left iliac, popliteal, and and anterior tibial artery thromboembolectomy with limited left SFA endarterectomy and bovine patch angioplasty and angioplasty of left common iliac artery with subsequent incision and drainage L groin due to lymphocele.   Perfusing LLE well  L groin nearly healed, continue current wound care until skin is completely closed  Anticoagulation was discussed with Dr. Donzetta Matters who recommended patient continue aspirin and coumadin daily; ok to stop plavix  Prescription for tramadol refilled per Dr. Donzetta Matters  Encouraged smoking cessation; consider hematology referral if patient is able to stop smoking  She has  arterial duplex and ABIs scheduled for 06/2018  Dagoberto Ligas PA-C [facility] [place] Office: [phone]

## 2018-04-24 ENCOUNTER — Other Ambulatory Visit: Payer: Self-pay

## 2018-04-25 ENCOUNTER — Telehealth: Payer: Self-pay | Admitting: Surgery

## 2018-04-25 MED FILL — traMADol HCL 50 MG TABS: 50 | 7 days supply | Qty: 30 | Fill #0

## 2018-04-25 NOTE — Telephone Encounter (Signed)
Patient called due to heavy vaginal bleeding and nausea.  She is on Coumadin for arterial thrombosis.  Her Plavix was recently stopped.  The bleeding has been going on for 2 days.  She is feeling weak.  I gave her the option of going to the ER tonight or her PCP tomorrow a.m. to get her Hb drawn to see how anemic she is.  Her last Hb in June was 8.4.  Further recommendations will be made based on these results.  She wants to wait and go to her PCP tomorrow.  Annamarie Major

## 2018-04-26 ENCOUNTER — Other Ambulatory Visit: Payer: Self-pay

## 2018-04-26 DIAGNOSIS — I739 Peripheral vascular disease, unspecified: Secondary | ICD-10-CM

## 2018-04-26 DIAGNOSIS — I779 Disorder of arteries and arterioles, unspecified: Secondary | ICD-10-CM

## 2018-04-27 ENCOUNTER — Ambulatory Visit: Payer: Disability Insurance | Admitting: Internal Medicine

## 2018-05-02 ENCOUNTER — Ambulatory Visit: Payer: Medicaid Other | Admitting: Internal Medicine

## 2018-05-08 ENCOUNTER — Ambulatory Visit: Payer: Medicaid Other | Admitting: Pharmacist

## 2018-05-12 ENCOUNTER — Inpatient Hospital Stay (HOSPITAL_COMMUNITY)
Admission: EM | Admit: 2018-05-12 | Discharge: 2018-05-19 | DRG: 271 | Disposition: A | Discharge status: Home / Self Care | Payer: Medicaid Other | Attending: Vascular Surgery | Admitting: Vascular Surgery

## 2018-05-12 ENCOUNTER — Encounter (HOSPITAL_COMMUNITY)
Admission: EM | Disposition: A | Discharge status: Home / Self Care | Payer: Self-pay | Source: Home / Self Care | Attending: Vascular Surgery

## 2018-05-12 ENCOUNTER — Encounter (HOSPITAL_COMMUNITY): Payer: Self-pay

## 2018-05-12 ENCOUNTER — Emergency Department (HOSPITAL_COMMUNITY): Payer: Medicaid Other | Admitting: Certified Registered"

## 2018-05-12 ENCOUNTER — Emergency Department (HOSPITAL_COMMUNITY): Payer: Medicaid Other

## 2018-05-12 ENCOUNTER — Other Ambulatory Visit: Payer: Self-pay

## 2018-05-12 DIAGNOSIS — Z818 Family history of other mental and behavioral disorders: Secondary | ICD-10-CM | POA: Diagnosis not present

## 2018-05-12 DIAGNOSIS — D62 Acute posthemorrhagic anemia: Secondary | ICD-10-CM | POA: Diagnosis not present

## 2018-05-12 DIAGNOSIS — N920 Excessive and frequent menstruation with regular cycle: Secondary | ICD-10-CM | POA: Diagnosis present

## 2018-05-12 DIAGNOSIS — I361 Nonrheumatic tricuspid (valve) insufficiency: Secondary | ICD-10-CM | POA: Diagnosis not present

## 2018-05-12 DIAGNOSIS — N838 Other noninflammatory disorders of ovary, fallopian tube and broad ligament: Secondary | ICD-10-CM

## 2018-05-12 DIAGNOSIS — Z87442 Personal history of urinary calculi: Secondary | ICD-10-CM

## 2018-05-12 DIAGNOSIS — E213 Hyperparathyroidism, unspecified: Secondary | ICD-10-CM | POA: Diagnosis present

## 2018-05-12 DIAGNOSIS — E1165 Type 2 diabetes mellitus with hyperglycemia: Secondary | ICD-10-CM | POA: Diagnosis present

## 2018-05-12 DIAGNOSIS — Z86718 Personal history of other venous thrombosis and embolism: Secondary | ICD-10-CM

## 2018-05-12 DIAGNOSIS — Z794 Long term (current) use of insulin: Secondary | ICD-10-CM | POA: Diagnosis not present

## 2018-05-12 DIAGNOSIS — Z6841 Body Mass Index (BMI) 40.0 and over, adult: Secondary | ICD-10-CM | POA: Diagnosis not present

## 2018-05-12 DIAGNOSIS — Z7901 Long term (current) use of anticoagulants: Secondary | ICD-10-CM | POA: Diagnosis not present

## 2018-05-12 DIAGNOSIS — N83201 Unspecified ovarian cyst, right side: Secondary | ICD-10-CM | POA: Diagnosis not present

## 2018-05-12 DIAGNOSIS — F17210 Nicotine dependence, cigarettes, uncomplicated: Secondary | ICD-10-CM | POA: Diagnosis present

## 2018-05-12 DIAGNOSIS — Z833 Family history of diabetes mellitus: Secondary | ICD-10-CM | POA: Diagnosis not present

## 2018-05-12 DIAGNOSIS — Z880 Allergy status to penicillin: Secondary | ICD-10-CM | POA: Diagnosis not present

## 2018-05-12 DIAGNOSIS — Z8249 Family history of ischemic heart disease and other diseases of the circulatory system: Secondary | ICD-10-CM | POA: Diagnosis not present

## 2018-05-12 DIAGNOSIS — I745 Embolism and thrombosis of iliac artery: Secondary | ICD-10-CM | POA: Diagnosis not present

## 2018-05-12 DIAGNOSIS — Z7902 Long term (current) use of antithrombotics/antiplatelets: Secondary | ICD-10-CM | POA: Diagnosis not present

## 2018-05-12 DIAGNOSIS — R739 Hyperglycemia, unspecified: Secondary | ICD-10-CM

## 2018-05-12 DIAGNOSIS — Z881 Allergy status to other antibiotic agents status: Secondary | ICD-10-CM | POA: Diagnosis not present

## 2018-05-12 DIAGNOSIS — E1151 Type 2 diabetes mellitus with diabetic peripheral angiopathy without gangrene: Principal | ICD-10-CM | POA: Diagnosis present

## 2018-05-12 DIAGNOSIS — K760 Fatty (change of) liver, not elsewhere classified: Secondary | ICD-10-CM | POA: Diagnosis present

## 2018-05-12 DIAGNOSIS — Z882 Allergy status to sulfonamides status: Secondary | ICD-10-CM | POA: Diagnosis not present

## 2018-05-12 DIAGNOSIS — Z7982 Long term (current) use of aspirin: Secondary | ICD-10-CM

## 2018-05-12 DIAGNOSIS — M79605 Pain in left leg: Secondary | ICD-10-CM

## 2018-05-12 DIAGNOSIS — K219 Gastro-esophageal reflux disease without esophagitis: Secondary | ICD-10-CM | POA: Diagnosis present

## 2018-05-12 DIAGNOSIS — Z888 Allergy status to other drugs, medicaments and biological substances status: Secondary | ICD-10-CM

## 2018-05-12 DIAGNOSIS — I998 Other disorder of circulatory system: Secondary | ICD-10-CM | POA: Diagnosis present

## 2018-05-12 DIAGNOSIS — Z0279 Encounter for issue of other medical certificate: Secondary | ICD-10-CM | POA: Diagnosis not present

## 2018-05-12 DIAGNOSIS — D649 Anemia, unspecified: Secondary | ICD-10-CM

## 2018-05-12 DIAGNOSIS — N939 Abnormal uterine and vaginal bleeding, unspecified: Secondary | ICD-10-CM | POA: Diagnosis present

## 2018-05-12 HISTORY — PX: PERCUTANEOUS VENOUS THROMBECTOMY,LYSIS WITH INTRAVASCULAR ULTRASOUND (IVUS): SHX6751

## 2018-05-12 LAB — COMPREHENSIVE METABOLIC PANEL
ALT: 11 U/L (ref 0–44)
AST: 14 U/L — ABNORMAL LOW (ref 15–41)
Albumin: 3.4 g/dL — ABNORMAL LOW (ref 3.5–5.0)
Alkaline Phosphatase: 85 U/L (ref 38–126)
Anion gap: 17 — ABNORMAL HIGH (ref 5–15)
BUN: 14 mg/dL (ref 6–20)
CO2: 16 mmol/L — ABNORMAL LOW (ref 22–32)
Calcium: 9.5 mg/dL (ref 8.9–10.3)
Chloride: 102 mmol/L (ref 98–111)
Creatinine, Ser: 1.58 mg/dL — ABNORMAL HIGH (ref 0.44–1.00)
GFR calc Af Amer: 45 mL/min — ABNORMAL LOW (ref >60)
GFR calc non Af Amer: 39 mL/min — ABNORMAL LOW (ref >60)
Glucose, Bld: 319 mg/dL — ABNORMAL HIGH (ref 70–99)
Potassium: 4.1 mmol/L (ref 3.5–5.1)
Sodium: 135 mmol/L (ref 135–145)
Total Bilirubin: 0.5 mg/dL (ref 0.3–1.2)
Total Protein: 6.8 g/dL (ref 6.5–8.1)

## 2018-05-12 LAB — POCT ACTIVATED CLOTTING TIME
Activated Clotting Time: 136 seconds
Activated Clotting Time: 257 seconds
Activated Clotting Time: 158 seconds

## 2018-05-12 LAB — POCT I-STAT 4, (NA,K, GLUC, HGB,HCT)
Glucose, Bld: 124 mg/dL — ABNORMAL HIGH (ref 70–99)
Glucose, Bld: 264 mg/dL — ABNORMAL HIGH (ref 70–99)
HCT: 18 % — ABNORMAL LOW (ref 36.0–46.0)
HCT: 24 % — ABNORMAL LOW (ref 36.0–46.0)
Hemoglobin: 6.1 g/dL — CL (ref 12.0–15.0)
Hemoglobin: 8.2 g/dL — ABNORMAL LOW (ref 12.0–15.0)
Potassium: 3.8 mmol/L (ref 3.5–5.1)
Potassium: 5.7 mmol/L — ABNORMAL HIGH (ref 3.5–5.1)
Sodium: 130 mmol/L — ABNORMAL LOW (ref 135–145)
Sodium: 136 mmol/L (ref 135–145)

## 2018-05-12 LAB — PROTIME-INR
INR: 1.01
Prothrombin Time: 13.3 seconds (ref 11.4–15.2)

## 2018-05-12 LAB — CBC WITH DIFFERENTIAL/PLATELET
Abs Immature Granulocytes: 0.1 10*3/uL (ref 0.0–0.1)
Basophils Absolute: 0.1 10*3/uL (ref 0.0–0.1)
Basophils Relative: 1 %
Eosinophils Absolute: 0.2 10*3/uL (ref 0.0–0.7)
Eosinophils Relative: 1 %
HCT: 25.1 % — ABNORMAL LOW (ref 36.0–46.0)
Hemoglobin: 7.2 g/dL — ABNORMAL LOW (ref 12.0–15.0)
Immature Granulocytes: 1 %
Lymphocytes Relative: 15 %
Lymphs Abs: 2.6 10*3/uL (ref 0.7–4.0)
MCH: 25.6 pg — ABNORMAL LOW (ref 26.0–34.0)
MCHC: 28.7 g/dL — ABNORMAL LOW (ref 30.0–36.0)
MCV: 89.3 fL (ref 78.0–100.0)
Monocytes Absolute: 0.8 10*3/uL (ref 0.1–1.0)
Monocytes Relative: 5 %
Neutro Abs: 13.6 10*3/uL — ABNORMAL HIGH (ref 1.7–7.7)
Neutrophils Relative %: 77 %
Platelets: 642 10*3/uL — ABNORMAL HIGH (ref 150–400)
RBC: 2.81 MIL/uL — ABNORMAL LOW (ref 3.87–5.11)
RDW: 18.4 % — ABNORMAL HIGH (ref 11.5–15.5)
WBC: 17.5 10*3/uL — ABNORMAL HIGH (ref 4.0–10.5)

## 2018-05-12 LAB — SURGICAL PCR SCREEN
MRSA, PCR: NEGATIVE
Staphylococcus aureus: NEGATIVE

## 2018-05-12 LAB — I-STAT CG4 LACTIC ACID, ED
Lactic Acid, Venous: 3.21 mmol/L (ref 0.5–1.9)
Lactic Acid, Venous: 1.27 mmol/L (ref 0.5–1.9)

## 2018-05-12 LAB — GLUCOSE, CAPILLARY
Glucose-Capillary: 200 mg/dL — ABNORMAL HIGH (ref 70–99)
Glucose-Capillary: 227 mg/dL — ABNORMAL HIGH (ref 70–99)
Glucose-Capillary: 230 mg/dL — ABNORMAL HIGH (ref 70–99)
Glucose-Capillary: 274 mg/dL — ABNORMAL HIGH (ref 70–99)

## 2018-05-12 LAB — I-STAT BETA HCG BLOOD, ED (MC, WL, AP ONLY): I-stat hCG, quantitative: <5 m[IU]/mL (ref <5)

## 2018-05-12 LAB — PREPARE RBC (CROSSMATCH)

## 2018-05-12 SURGERY — THROMBECTOMY, VEIN, PERCUTANEOUS
Anesthesia: General | Laterality: Left

## 2018-05-12 MED ORDER — ROCURONIUM BROMIDE 10 MG/ML (PF) SYRINGE
PREFILLED_SYRINGE | INTRAVENOUS | Status: AC
  Filled 2018-05-12: qty 10

## 2018-05-12 MED ORDER — FERROUS SULFATE 325 (65 FE) MG PO TABS
325.0000 mg | ORAL_TABLET | Freq: Every day | ORAL | Status: DC
  Administered 2018-05-13 – 2018-05-19 (×7): 325 mg via ORAL
  Filled 2018-05-12 (×7): qty 1

## 2018-05-12 MED ORDER — ALBUMIN HUMAN 5 % IV SOLN
INTRAVENOUS | Status: DC | PRN
  Administered 2018-05-12: 18:00:00 via INTRAVENOUS

## 2018-05-12 MED ORDER — NITROGLYCERIN FOR UTERINE RELAXATION OPTIME 200MCG/ML
INTRAVENOUS | Status: DC | PRN
  Administered 2018-05-12: 20 mL via INTRAVENOUS

## 2018-05-12 MED ORDER — MAGNESIUM SULFATE 2 GM/50ML IV SOLN: 2.0000 g | Freq: Every day | INTRAVENOUS | Status: DC | PRN

## 2018-05-12 MED ORDER — FENTANYL CITRATE (PF) 250 MCG/5ML IJ SOLN
INTRAMUSCULAR | Status: AC
  Filled 2018-05-12: qty 5

## 2018-05-12 MED ORDER — ACETAMINOPHEN 325 MG RE SUPP: 325.0000 mg | RECTAL | Status: DC | PRN

## 2018-05-12 MED ORDER — SODIUM CHLORIDE 0.9% IV SOLUTION: Freq: Once | INTRAVENOUS | Status: DC

## 2018-05-12 MED ORDER — OXYCODONE HCL 5 MG/5ML PO SOLN: 5.0000 mg | Freq: Once | ORAL | Status: DC | PRN

## 2018-05-12 MED ORDER — PANTOPRAZOLE SODIUM 40 MG PO TBEC
40.0000 mg | DELAYED_RELEASE_TABLET | Freq: Every day | ORAL | Status: DC
  Administered 2018-05-13 – 2018-05-19 (×7): 40 mg via ORAL
  Filled 2018-05-12 (×7): qty 1

## 2018-05-12 MED ORDER — SUGAMMADEX SODIUM 500 MG/5ML IV SOLN
INTRAVENOUS | Status: AC
  Filled 2018-05-12: qty 5

## 2018-05-12 MED ORDER — FENTANYL CITRATE (PF) 100 MCG/2ML IJ SOLN
INTRAMUSCULAR | Status: DC | PRN
  Administered 2018-05-12 (×5): 50 ug via INTRAVENOUS

## 2018-05-12 MED ORDER — ACETAMINOPHEN 325 MG PO TABS: 325.0000 mg | ORAL_TABLET | ORAL | Status: DC | PRN

## 2018-05-12 MED ORDER — NICOTINE 14 MG/24HR TD PT24: 14.0000 mg | MEDICATED_PATCH | Freq: Every day | TRANSDERMAL | Status: DC

## 2018-05-12 MED ORDER — GUAIFENESIN-DM 100-10 MG/5ML PO SYRP: 15.0000 mL | ORAL_SOLUTION | ORAL | Status: DC | PRN

## 2018-05-12 MED ORDER — FENTANYL CITRATE (PF) 100 MCG/2ML IJ SOLN: 25.0000 ug | INTRAMUSCULAR | Status: DC | PRN

## 2018-05-12 MED ORDER — SUGAMMADEX SODIUM 200 MG/2ML IV SOLN
INTRAVENOUS | Status: DC | PRN
  Administered 2018-05-12: 500 mg via INTRAVENOUS

## 2018-05-12 MED ORDER — MIDAZOLAM HCL 2 MG/2ML IJ SOLN
INTRAMUSCULAR | Status: AC
  Filled 2018-05-12: qty 2

## 2018-05-12 MED ORDER — PHENYLEPHRINE 40 MCG/ML (10ML) SYRINGE FOR IV PUSH (FOR BLOOD PRESSURE SUPPORT)
PREFILLED_SYRINGE | INTRAVENOUS | Status: AC
  Filled 2018-05-12: qty 10

## 2018-05-12 MED ORDER — LABETALOL HCL 5 MG/ML IV SOLN: 10.0000 mg | INTRAVENOUS | Status: DC | PRN

## 2018-05-12 MED ORDER — DEXAMETHASONE SODIUM PHOSPHATE 10 MG/ML IJ SOLN
INTRAMUSCULAR | Status: AC
  Filled 2018-05-12: qty 1

## 2018-05-12 MED ORDER — ASPIRIN EC 81 MG PO TBEC
81.0000 mg | DELAYED_RELEASE_TABLET | Freq: Every day | ORAL | Status: DC
  Administered 2018-05-13 – 2018-05-19 (×7): 81 mg via ORAL
  Filled 2018-05-12 (×7): qty 1

## 2018-05-12 MED ORDER — SODIUM CHLORIDE 0.9 % IJ SOLN
INTRAVENOUS | Status: DC | PRN
  Administered 2018-05-12: 250 mL via INTRAMUSCULAR

## 2018-05-12 MED ORDER — MORPHINE SULFATE (PF) 2 MG/ML IV SOLN
2.0000 mg | INTRAVENOUS | Status: DC | PRN
  Administered 2018-05-13 – 2018-05-14 (×6): 2 mg via INTRAVENOUS
  Filled 2018-05-12 (×6): qty 1

## 2018-05-12 MED ORDER — DEXAMETHASONE SODIUM PHOSPHATE 4 MG/ML IJ SOLN
INTRAMUSCULAR | Status: DC | PRN
  Administered 2018-05-12: 8 mg via INTRAVENOUS

## 2018-05-12 MED ORDER — HYDROXYZINE HCL 25 MG PO TABS
50.0000 mg | ORAL_TABLET | Freq: Every evening | ORAL | Status: DC | PRN
  Administered 2018-05-14 – 2018-05-18 (×5): 50 mg via ORAL
  Filled 2018-05-12 (×5): qty 2

## 2018-05-12 MED ORDER — POTASSIUM CHLORIDE CRYS ER 20 MEQ PO TBCR: 20.0000 meq | EXTENDED_RELEASE_TABLET | Freq: Every day | ORAL | Status: DC | PRN

## 2018-05-12 MED ORDER — SODIUM CHLORIDE 0.9 % IV SOLN: 500.0000 mL | Freq: Once | INTRAVENOUS | Status: DC | PRN

## 2018-05-12 MED ORDER — LACTATED RINGERS IV SOLN
INTRAVENOUS | Status: DC
  Administered 2018-05-12: 14:00:00 via INTRAVENOUS

## 2018-05-12 MED ORDER — HEPARIN BOLUS VIA INFUSION
5000.0000 [IU] | Freq: Once | INTRAVENOUS | Status: AC
  Administered 2018-05-12: 5000 [IU] via INTRAVENOUS
  Filled 2018-05-12: qty 5000

## 2018-05-12 MED ORDER — VANCOMYCIN HCL IN DEXTROSE 1-5 GM/200ML-% IV SOLN
1000.0000 mg | Freq: Two times a day (BID) | INTRAVENOUS | Status: AC
  Administered 2018-05-13 (×2): 1000 mg via INTRAVENOUS
  Filled 2018-05-12 (×2): qty 200

## 2018-05-12 MED ORDER — 0.9 % SODIUM CHLORIDE (POUR BTL) OPTIME
TOPICAL | Status: DC | PRN
  Administered 2018-05-12: 1000 mL

## 2018-05-12 MED ORDER — HEPARIN SODIUM (PORCINE) 1000 UNIT/ML IJ SOLN
INTRAMUSCULAR | Status: DC | PRN
  Administered 2018-05-12 (×2): 5000 [IU] via INTRAVENOUS
  Administered 2018-05-12: 3000 [IU] via INTRAVENOUS

## 2018-05-12 MED ORDER — SODIUM CHLORIDE 0.9 % IV SOLN
INTRAVENOUS | Status: DC
  Administered 2018-05-12: 21:00:00 via INTRAVENOUS

## 2018-05-12 MED ORDER — SODIUM CHLORIDE 0.9 % IV SOLN
INTRAVENOUS | Status: AC
  Filled 2018-05-12: qty 1.2

## 2018-05-12 MED ORDER — FENTANYL CITRATE (PF) 100 MCG/2ML IJ SOLN
50.0000 ug | Freq: Once | INTRAMUSCULAR | Status: AC
  Administered 2018-05-12: 50 ug via INTRAVENOUS
  Filled 2018-05-12: qty 2

## 2018-05-12 MED ORDER — LIDOCAINE 2% (20 MG/ML) 5 ML SYRINGE
INTRAMUSCULAR | Status: DC | PRN
  Administered 2018-05-12: 80 mg via INTRAVENOUS

## 2018-05-12 MED ORDER — ALBUTEROL SULFATE HFA 108 (90 BASE) MCG/ACT IN AERS
INHALATION_SPRAY | RESPIRATORY_TRACT | Status: AC
  Filled 2018-05-12: qty 6.7

## 2018-05-12 MED ORDER — POLYETHYLENE GLYCOL 3350 17 G PO PACK
17.0000 g | PACK | Freq: Every day | ORAL | Status: DC | PRN
  Administered 2018-05-15: 17 g via ORAL
  Filled 2018-05-12: qty 1

## 2018-05-12 MED ORDER — ALUM & MAG HYDROXIDE-SIMETH 200-200-20 MG/5ML PO SUSP
15.0000 mL | ORAL | Status: DC | PRN
  Administered 2018-05-13 – 2018-05-17 (×8): 30 mL via ORAL
  Filled 2018-05-12 (×9): qty 30

## 2018-05-12 MED ORDER — ALTEPLASE 2 MG IJ SOLR
20.0000 mg | Freq: Once | INTRAMUSCULAR | Status: AC
  Administered 2018-05-12: 60 mg
  Filled 2018-05-12: qty 20

## 2018-05-12 MED ORDER — VANCOMYCIN HCL 10 G IV SOLR
1500.0000 mg | Freq: Once | INTRAVENOUS | Status: AC
  Administered 2018-05-12: 1500 mg via INTRAVENOUS
  Filled 2018-05-12: qty 1500

## 2018-05-12 MED ORDER — SODIUM CHLORIDE 0.9 % IJ SOLN
INTRAMUSCULAR | Status: AC
  Filled 2018-05-12: qty 10

## 2018-05-12 MED ORDER — GABAPENTIN 300 MG PO CAPS
300.0000 mg | ORAL_CAPSULE | Freq: Two times a day (BID) | ORAL | Status: DC
  Administered 2018-05-12 – 2018-05-19 (×14): 300 mg via ORAL
  Filled 2018-05-12 (×14): qty 1

## 2018-05-12 MED ORDER — HEPARIN (PORCINE) IN NACL 100-0.45 UNIT/ML-% IJ SOLN
2150.0000 [IU]/h | INTRAMUSCULAR | Status: DC
  Administered 2018-05-12: 2150 [IU]/h via INTRAVENOUS
  Administered 2018-05-12: 1850 [IU]/h via INTRAVENOUS
  Filled 2018-05-12 (×3): qty 250

## 2018-05-12 MED ORDER — ONDANSETRON HCL 4 MG/2ML IJ SOLN
INTRAMUSCULAR | Status: AC
  Filled 2018-05-12: qty 2

## 2018-05-12 MED ORDER — MIDAZOLAM HCL 5 MG/5ML IJ SOLN
INTRAMUSCULAR | Status: DC | PRN
Start: 2018-05-12 — End: 2018-05-12
  Administered 2018-05-12: 2 mg via INTRAVENOUS

## 2018-05-12 MED ORDER — ROCURONIUM BROMIDE 100 MG/10ML IV SOLN
INTRAVENOUS | Status: DC | PRN
  Administered 2018-05-12: 10 mg via INTRAVENOUS
  Administered 2018-05-12: 60 mg via INTRAVENOUS
  Administered 2018-05-12: 20 mg via INTRAVENOUS
  Administered 2018-05-12: 10 mg via INTRAVENOUS
  Administered 2018-05-12 (×2): 20 mg via INTRAVENOUS

## 2018-05-12 MED ORDER — PROPOFOL 10 MG/ML IV BOLUS
INTRAVENOUS | Status: DC | PRN
  Administered 2018-05-12: 150 mg via INTRAVENOUS

## 2018-05-12 MED ORDER — BUPROPION HCL ER (SR) 150 MG PO TB12
150.0000 mg | ORAL_TABLET | Freq: Two times a day (BID) | ORAL | Status: DC
  Administered 2018-05-13 – 2018-05-19 (×13): 150 mg via ORAL
  Filled 2018-05-12 (×14): qty 1

## 2018-05-12 MED ORDER — ONDANSETRON HCL 4 MG/2ML IJ SOLN
4.0000 mg | Freq: Four times a day (QID) | INTRAMUSCULAR | Status: DC | PRN
  Administered 2018-05-13 – 2018-05-18 (×4): 4 mg via INTRAVENOUS
  Filled 2018-05-12 (×5): qty 2

## 2018-05-12 MED ORDER — BISACODYL 10 MG RE SUPP: 10.0000 mg | Freq: Every day | RECTAL | Status: DC | PRN

## 2018-05-12 MED ORDER — PHENYLEPHRINE 40 MCG/ML (10ML) SYRINGE FOR IV PUSH (FOR BLOOD PRESSURE SUPPORT)
PREFILLED_SYRINGE | INTRAVENOUS | Status: DC | PRN
  Administered 2018-05-12 (×5): 80 ug via INTRAVENOUS

## 2018-05-12 MED ORDER — ALTEPLASE 1 MG/ML SYRINGE FOR VASCULAR PROCEDURE: 20.0000 mg | INTRAMUSCULAR | Status: DC

## 2018-05-12 MED ORDER — DIPHENHYDRAMINE HCL 25 MG PO CAPS
25.0000 mg | ORAL_CAPSULE | Freq: Four times a day (QID) | ORAL | Status: DC | PRN
  Administered 2018-05-13 – 2018-05-17 (×4): 25 mg via ORAL
  Filled 2018-05-12 (×6): qty 1

## 2018-05-12 MED ORDER — INSULIN ASPART 100 UNIT/ML ~~LOC~~ SOLN
SUBCUTANEOUS | Status: AC
  Filled 2018-05-12: qty 1

## 2018-05-12 MED ORDER — IOPAMIDOL (ISOVUE-370) INJECTION 76%
100.0000 mL | Freq: Once | INTRAVENOUS | Status: AC | PRN
  Administered 2018-05-12: 100 mL via INTRAVENOUS

## 2018-05-12 MED ORDER — LORAZEPAM 2 MG/ML IJ SOLN
0.5000 mg | Freq: Once | INTRAMUSCULAR | Status: AC
  Administered 2018-05-12: 0.5 mg via INTRAVENOUS
  Filled 2018-05-12: qty 1

## 2018-05-12 MED ORDER — METOPROLOL TARTRATE 5 MG/5ML IV SOLN
2.0000 mg | INTRAVENOUS | Status: DC | PRN
  Administered 2018-05-15: 2 mg via INTRAVENOUS
  Filled 2018-05-12: qty 5

## 2018-05-12 MED ORDER — PROMETHAZINE HCL 25 MG/ML IJ SOLN: 6.2500 mg | INTRAMUSCULAR | Status: DC | PRN

## 2018-05-12 MED ORDER — PHENOL 1.4 % MT LIQD
1.0000 | OROMUCOSAL | Status: DC | PRN
  Administered 2018-05-14: 1 via OROMUCOSAL
  Filled 2018-05-12: qty 177

## 2018-05-12 MED ORDER — ALBUTEROL SULFATE HFA 108 (90 BASE) MCG/ACT IN AERS
INHALATION_SPRAY | RESPIRATORY_TRACT | Status: DC | PRN
  Administered 2018-05-12: 6 via RESPIRATORY_TRACT

## 2018-05-12 MED ORDER — LACTATED RINGERS IV SOLN
INTRAVENOUS | Status: DC | PRN
  Administered 2018-05-12 (×2): via INTRAVENOUS

## 2018-05-12 MED ORDER — HYDROXYZINE HCL 25 MG PO TABS
25.0000 mg | ORAL_TABLET | Freq: Two times a day (BID) | ORAL | Status: DC | PRN
Start: 2018-05-12 — End: 2018-05-19
  Administered 2018-05-13 – 2018-05-19 (×7): 25 mg via ORAL
  Filled 2018-05-12 (×7): qty 1

## 2018-05-12 MED ORDER — SODIUM CHLORIDE 0.9 % IV BOLUS
1000.0000 mL | Freq: Once | INTRAVENOUS | Status: AC
  Administered 2018-05-12: 1000 mL via INTRAVENOUS

## 2018-05-12 MED ORDER — IOPAMIDOL (ISOVUE-370) INJECTION 76%
INTRAVENOUS | Status: AC
  Filled 2018-05-12: qty 100

## 2018-05-12 MED ORDER — OXYCODONE HCL 5 MG PO TABS: 5.0000 mg | ORAL_TABLET | Freq: Once | ORAL | Status: DC | PRN

## 2018-05-12 MED ORDER — ONDANSETRON HCL 4 MG/2ML IJ SOLN
INTRAMUSCULAR | Status: DC | PRN
  Administered 2018-05-12: 4 mg via INTRAVENOUS

## 2018-05-12 MED ORDER — MUPIROCIN 2 % EX OINT
TOPICAL_OINTMENT | CUTANEOUS | Status: AC
  Administered 2018-05-12: 1 via TOPICAL
  Filled 2018-05-12: qty 22

## 2018-05-12 MED ORDER — ALTEPLASE (PULMONARY EMBOLISM) INFUSION
20.0000 mg | INTRAVENOUS | Status: DC
  Filled 2018-05-12 (×4): qty 20

## 2018-05-12 MED ORDER — SODIUM CHLORIDE 0.9 % IV SOLN
INTRAVENOUS | Status: DC | PRN
  Administered 2018-05-12: 60 ug/min via INTRAVENOUS

## 2018-05-12 MED ORDER — HYDRALAZINE HCL 20 MG/ML IJ SOLN: 5.0000 mg | INTRAMUSCULAR | Status: DC | PRN

## 2018-05-12 MED ORDER — PROPOFOL 10 MG/ML IV BOLUS
INTRAVENOUS | Status: AC
  Filled 2018-05-12: qty 20

## 2018-05-12 MED ORDER — SODIUM CHLORIDE 0.9 % IV SOLN
INTRAVENOUS | Status: DC | PRN
  Administered 2018-05-12: 13:00:00
  Administered 2018-05-12 (×2): 500 mL

## 2018-05-12 MED ORDER — INSULIN ASPART 100 UNIT/ML ~~LOC~~ SOLN
SUBCUTANEOUS | Status: DC | PRN
  Administered 2018-05-12 (×2): 5 [IU] via INTRAVENOUS

## 2018-05-12 MED ORDER — MUPIROCIN 2 % EX OINT
1.0000 "application " | TOPICAL_OINTMENT | Freq: Once | CUTANEOUS | Status: AC
  Administered 2018-05-12: 1 via TOPICAL

## 2018-05-12 MED ORDER — ONDANSETRON HCL 4 MG/2ML IJ SOLN
4.0000 mg | Freq: Once | INTRAMUSCULAR | Status: AC
  Administered 2018-05-12: 4 mg via INTRAVENOUS
  Filled 2018-05-12: qty 2

## 2018-05-12 MED ORDER — OXYCODONE-ACETAMINOPHEN 5-325 MG PO TABS
1.0000 | ORAL_TABLET | ORAL | Status: DC | PRN
  Administered 2018-05-13 – 2018-05-18 (×29): 2 via ORAL
  Administered 2018-05-18 – 2018-05-19 (×2): 1 via ORAL
  Administered 2018-05-19: 2 via ORAL
  Administered 2018-05-19: 1 via ORAL
  Filled 2018-05-12 (×12): qty 2
  Filled 2018-05-12: qty 1
  Filled 2018-05-12 (×9): qty 2
  Filled 2018-05-12: qty 1
  Filled 2018-05-12: qty 2
  Filled 2018-05-12: qty 1
  Filled 2018-05-12 (×9): qty 2

## 2018-05-12 MED ORDER — DOCUSATE SODIUM 100 MG PO CAPS
100.0000 mg | ORAL_CAPSULE | Freq: Every day | ORAL | Status: DC
  Administered 2018-05-13 – 2018-05-17 (×5): 100 mg via ORAL
  Filled 2018-05-12 (×7): qty 1

## 2018-05-12 MED ORDER — LIDOCAINE 2% (20 MG/ML) 5 ML SYRINGE
INTRAMUSCULAR | Status: AC
  Filled 2018-05-12: qty 5

## 2018-05-12 SURGICAL SUPPLY — 70 items
BAG SNAP BAND KOVER 36X36 (MISCELLANEOUS) ×4 IMPLANT
BALLN COYOTE OTW 2.5X150X150 (BALLOONS) ×2
BALLOON COYOTE OTW 2.5X150X150 (BALLOONS) ×1 IMPLANT
BANDAGE ACE 4X5 VEL STRL LF (GAUZE/BANDAGES/DRESSINGS) ×2 IMPLANT
BANDAGE ACE 6X5 VEL STRL LF (GAUZE/BANDAGES/DRESSINGS) ×2 IMPLANT
BLADE SURG 11 STRL SS (BLADE) ×2 IMPLANT
CANISTER PENUMBRA MAX (MISCELLANEOUS) ×2 IMPLANT
CANISTER SUCT 3000ML PPV (MISCELLANEOUS) ×2 IMPLANT
CATH INDIGO 3 ST-TIP 150CM (CATHETERS) ×2 IMPLANT
CATH INDIGO 6 ST-TIP 135CM (CATHETERS) ×2 IMPLANT
CATH INDIGO 8 XTORQ TIP 115CM (CATHETERS) ×2 IMPLANT
CATH OMNI FLUSH .035X70CM (CATHETERS) IMPLANT
CATH OMNI FLUSH 5F 65CM (CATHETERS) ×2 IMPLANT
CATH PULSE SPRAY 45CMX15CM 5FR (MISCELLANEOUS) ×2 IMPLANT
CATH VISIONS PV .035 IVUS (CATHETERS) ×2 IMPLANT
COVER BACK TABLE 60X90IN (DRAPES) ×4 IMPLANT
COVER DOME SNAP 22 D (MISCELLANEOUS) ×2 IMPLANT
COVER PROBE W GEL 5X96 (DRAPES) ×2 IMPLANT
COVER SURGICAL LIGHT HANDLE (MISCELLANEOUS) ×2 IMPLANT
DERMABOND ADVANCED (GAUZE/BANDAGES/DRESSINGS) ×1
DERMABOND ADVANCED .7 DNX12 (GAUZE/BANDAGES/DRESSINGS) ×1 IMPLANT
DEVICE CLOSURE PERCLS PRGLD 6F (VASCULAR PRODUCTS) ×1 IMPLANT
DEVICE INFLATION ENCORE 26 (MISCELLANEOUS) ×2 IMPLANT
DEVICE TORQUE H2O (MISCELLANEOUS) ×2 IMPLANT
DRAPE FEMORAL ANGIO 80X135IN (DRAPES) ×2 IMPLANT
DRAPE INCISE IOBAN 85X60 (DRAPES) ×2 IMPLANT
ELECT REM PT RETURN 9FT ADLT (ELECTROSURGICAL)
ELECTRODE REM PT RTRN 9FT ADLT (ELECTROSURGICAL) IMPLANT
GAUZE 4X4 16PLY RFD (DISPOSABLE) ×2 IMPLANT
GLOVE BIOGEL PI IND STRL 7.5 (GLOVE) ×2 IMPLANT
GLOVE BIOGEL PI INDICATOR 7.5 (GLOVE) ×2
GLOVE ECLIPSE 7.5 STRL STRAW (GLOVE) ×2 IMPLANT
GLOVE SURG SS PI 6.0 STRL IVOR (GLOVE) ×4 IMPLANT
GLOVE SURG SS PI 7.5 STRL IVOR (GLOVE) ×6 IMPLANT
GOWN STRL REUS W/ TWL LRG LVL3 (GOWN DISPOSABLE) ×2 IMPLANT
GOWN STRL REUS W/ TWL XL LVL3 (GOWN DISPOSABLE) ×2 IMPLANT
GOWN STRL REUS W/TWL LRG LVL3 (GOWN DISPOSABLE) ×2
GOWN STRL REUS W/TWL XL LVL3 (GOWN DISPOSABLE) ×2
GUIDEWIRE ANGLED .035X150CM (WIRE) ×2 IMPLANT
GUIDEWIRE ANGLED .035X260CM (WIRE) ×2 IMPLANT
GUIDEWIRE STR TIP .014X300X8 (WIRE) ×2 IMPLANT
KIT BASIN OR (CUSTOM PROCEDURE TRAY) ×2 IMPLANT
KIT TURNOVER KIT B (KITS) ×2 IMPLANT
NEEDLE 18GX1X1/2 (RX/OR ONLY) (NEEDLE) ×2 IMPLANT
NEEDLE HYPO 25GX1X1/2 BEV (NEEDLE) IMPLANT
NEEDLE PERC 18GX7CM (NEEDLE) ×2 IMPLANT
NS IRRIG 1000ML POUR BTL (IV SOLUTION) IMPLANT
PAD ARMBOARD 7.5X6 YLW CONV (MISCELLANEOUS) ×4 IMPLANT
PERCLOSE PROGLIDE 6F (VASCULAR PRODUCTS) ×2
PROTECTION STATION PRESSURIZED (MISCELLANEOUS) ×4
SET MICROPUNCTURE 5F STIFF (MISCELLANEOUS) ×4 IMPLANT
SHEATH AVANTI 11CM 5FR (SHEATH) ×2 IMPLANT
SHEATH PINNACLE 8F 10CM (SHEATH) ×2 IMPLANT
STATION PROTECTION PRESSURIZED (MISCELLANEOUS) ×2 IMPLANT
STOPCOCK 4 WAY LG BORE MALE ST (IV SETS) ×2 IMPLANT
STOPCOCK MORSE 400PSI 3WAY (MISCELLANEOUS) ×4 IMPLANT
SUT VIC AB 4-0 PS2 27 (SUTURE) ×2 IMPLANT
SYR 10ML LL (SYRINGE) ×6 IMPLANT
SYR 20CC LL (SYRINGE) ×4 IMPLANT
SYR 30ML LL (SYRINGE) ×2 IMPLANT
SYR 3ML LL SCALE MARK (SYRINGE) ×2 IMPLANT
SYR CONTROL 10ML LL (SYRINGE) IMPLANT
SYR MEDRAD MARK V 150ML (SYRINGE) ×2 IMPLANT
SYRINGE 20CC LL (MISCELLANEOUS) ×4 IMPLANT
TOWEL GREEN STERILE (TOWEL DISPOSABLE) ×4 IMPLANT
TOWEL GREEN STERILE FF (TOWEL DISPOSABLE) ×2 IMPLANT
TRAY FOLEY MTR SLVR 16FR STAT (SET/KITS/TRAYS/PACK) IMPLANT
TUBING ASPIRATION INDIGO (MISCELLANEOUS) ×2 IMPLANT
WIRE BENTSON .035X145CM (WIRE) ×4 IMPLANT
WIRE ROSEN-J .035X260CM (WIRE) ×2 IMPLANT

## 2018-05-12 NOTE — Progress Notes (Signed)
ANTICOAGULATION CONSULT NOTE - Follow Up Consult  Pharmacy Consult for Heparin Indication: arterial embolizm, ischemic limb, s/p thrombectomy and angioplasty  Allergies  Allergen Reactions  . Ciprofloxacin Hcl Hives  . Macrobid [Nitrofurantoin Macrocrystal] Hives, Shortness Of Breath and Rash  . Other Hives, Shortness Of Breath and Rash    NO "-CILLINS"!!!  . Penicillins Shortness Of Breath    Had had cephalosporins without incident  . Sulfa Antibiotics Hives, Shortness Of Breath and Rash  . Lexapro [Escitalopram Oxalate]     I just did not like it.    Patient Measurements: Height: 5\' 7"  (170.2 cm) Weight: 274 lb 0.5 oz (124.3 kg) IBW/kg (Calculated) : 61.6 Heparin Dosing Weight: 93 kg  Vital Signs: Temp: 97.4 F (36.3 C) (08/09 2200) Temp Source: Oral (08/09 2200) BP: 148/79 (08/09 2200) Pulse Rate: 88 (08/09 2145)  Labs: Recent Labs    05/12/18 0613 05/12/18 1741 05/12/18 1949  HGB 7.2* 6.1* 8.2*  HCT 25.1* 18.0* 24.0*  PLT 642*  --   --   LABPROT 13.3  --   --   INR 1.01  --   --   CREATININE 1.58*  --   --     Estimated Creatinine Clearance: 62.8 mL/min (A) (by C-G formula based on SCr of 1.58 mg/dL (H)).  Assessment:  71 yof presented to the ED with leg pain. She was prescribed coumadin but stopped taking.  Baseline Hgb is low at 7.2 and platelets are elevated. INR 1.01.  IV heparin begun in ED with 5000 units IV bolus then 1850 units/hr.   Heparin continued to OR, and now on 4E post-thombectoy.   PACU RN called Pharmacy ~1 hr ago to report heparin rate had  increased to 2150 units/hr during procedure and continues at that rate.  Heavy menstrual bleeding reported.  4E RN reports some bleeding from lips.       Operative note reviewed.  Several heparin boluses given in OR based on ACTs.   Last heparin bolus of 5000 units given and infusion rate increased to 2150 units/hr at 7:09pm.   Total of 60 mg of tPA given during procedure. Also received 2 units PRBCs.      Goal of Therapy:  Heparin level 0.3-0.7 units/ml Monitor platelets by anticoagulation protocol: Yes   Plan:   Continue heparin drip at 2150 units/hr  Heparin level ~1am, ~6 hrs after last heparin bolus and increase.  Daily heparin level and CBC while on heparin.  Monitor for any increased bleeding.  Arty Baumgartner, [place] Pager: [phone] 05/12/2018,10:15 PM

## 2018-05-12 NOTE — ED Triage Notes (Signed)
Pt BIB [facility] for eval of severe L leg pain onset this AM at 0130. 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 Reports GI illness approx 3 weeks ago during which she was not able to keep her coumadin down and required bridging w/ lovenox. Reports she has not since restarted coumadin. Reports 10/10 L Leg pain, palpable L DP pulse on arrival, full sensation normal movement

## 2018-05-12 NOTE — Op Note (Signed)
Patient name: Terri Sparks MRN: [id number] DOB: 25-Aug-1976 Sex: female  05/12/2018 Pre-operative Diagnosis: Ischemic left leg Post-operative diagnosis:  Same Surgeon:  Annamarie Major  Assistant: Fortunato Curling, MD Procedure Performed:  1.  Ultrasound-guided access, right femoral artery  2.  Abdominal aortogram  3.  Left leg runoff  4.  Mechanical thrombectomy using the Penumbra device of the left common iliac, left external iliac, left common femoral, left profundofemoral, left superficial femoral, left popliteal, left anterior tibial, and left peroneal artery  5.  Balloon angioplasty, left anterior tibial artery  6.  Intra-arterial administration of TPA and nitroglycerin  7.  Closure device (Proglide)   Indications: The patient has had 2 prior episodes of an ischemic leg.  She has undergone thrombectomy as well as stenting of her left common iliac artery with a atrium stent.  She presented to the emergency department today with acute onset of numbness in her left foot which was an acute change.  She had a CT scan which showed that her left common iliac stent was occluded.  She also has a healing wound from her prior operations in the left groin with surrounding yeast infection.  We wanted to avoid having to reopen her groin and therefore elected to proceed with mechanical thrombectomy.  Risks and benefits were discussed with the patient.  She understands that this is a limb threatening situation.  Procedure:  The patient was identified in the holding area and taken to room 8.  The patient was then placed supine on the table and prepped and draped in the usual sterile fashion.  A time out was called.  Ultrasound was used to evaluate the right common femoral artery.  It was patent .  A digital ultrasound image was acquired.  A micropuncture needle was used to access the right common femoral artery under ultrasound guidance.  An 018 wire was advanced without resistance and a micropuncture sheath  was placed.  The 018 wire was removed and a benson wire was placed.  The micropuncture sheath was exchanged for a 5 french sheath.  An omniflush catheter was advanced over the wire into the abdominal aorta and an abdominal aortogram was performed.  This revealed occlusion of the left common iliac stent as well as the left external and common femoral artery.  Because she has renal insufficiency and had already undergone CT scan, at this time I elected not to get additional images of the left leg.  The patient came into the operating room having already received a 5000 unit bolus of heparin and a continuous drip being infused which was not stopped.  I then used the Omni Flush catheter and a Glidewire to get a wire into the left common femoral artery.  A 15 cm infusion length catheter was advanced over the bifurcation into the mid left external iliac artery.  Initially, 20 mg of TPA was administered however this was done without the distal And the UniFuse catheter, and so an additional 20 mg of TPA was then given through the UniFuse catheter with the distal cath in place.  I allowed the TPA to sit for 20 minutes.  Next a Rosen wire was inserted into the UniFuse catheter and directed into the common femoral artery.  An 8 French 45 cm Pinnacle sheath was then advanced over the aortic bifurcation into the proximal left common iliac artery.  A CAT 8 Penumbra device was then used to perform mechanical thrombectomy of the left common iliac, left external iliac,  and left common femoral arteries.  Once this was done I performed an arteriogram which showed that the iliac system and femoral artery were now widely patent.  There was thrombus visualized in the proximal profunda as well as proximal superficial femoral artery.  A Glidewire was then used to gain wire access into the superficial femoral artery and the CAT 8 device was used to perform mechanical thrombectomy of the proximal left superficial femoral artery.   Additional images of the left leg were performed.  The superficial femoral artery was patent however there was an abrupt cut off at the popliteal artery below the knee.  We then advanced the CAT 8 device down to the popliteal artery and used a Glidewire to gain access into the anterior tibial artery.  The CAT 8 device was then removed and replaced with a CAT 6 device which was used to perform mechanical thrombectomy of the proximal anterior tibial artery.  Contrast injections to the anterior tibial artery showed that it was patent down to the ankle however it occluded abruptly at that level.  A CAT 3 device was then used to perform mechanical thrombectomy of the anterior tibial artery down across the ankle.  An additional 5 mg of TPA was administered as well as a total of 1200 mcg of nitroglycerin.  There appeared to be a stenosis in the distal anterior tibial artery as well as poor opacification of the digital arteries and plantar arch.  A V-14 wire was then directed into the plantar arch and the CAT 3 device was used to perform mechanical thrombectomy of the anterior tibial and dorsalis pedis arteries.  Multiple passes were made.  On follow-up imaging with contrast injections through a catheter in the anterior tibial artery showed that this was patent down to just above the ankle and then mostly collaterals were filling at this level.  There was poor opacification of the foot.  At this point I felt we done all we could do to the anterior tibial artery and elected to try and remove the thrombus in the tibioperoneal trunk and peroneal artery.  The the-14 wire was then used to cannulate the peroneal artery and the CAT 3 device was used to perform mechanical thrombectomy of the peroneal artery.  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  The anterior tibial and peroneal artery were patent down to  the ankle where there was mostly collaterals.  I then injected more nitroglycerin and another 15 mg of TPA with the catheter in the popliteal artery.  Completion imaging showed mostly collaterals out on the ankle.  At this time I did not know whether this was chronic or acute.  It was acting more like it was chronic.  I did not feel like additional percutaneous attempts would be successful and so attention was then turned towards the thrombus within the profundofemoral artery.  The CAT 8 device was then navigated into the profundofemoral artery and mechanical thrombectomy was performed of the profundofemoral artery.  After 2 passes, we had removed the thrombus within the profundofemoral artery.  At this point the decision was made to terminate the procedure.  A short 8 French sheath was used to replace the long sheath.  A pro-glide device was used to close the arteriotomy.  Manual pressure was held for 10 minutes until this was hemostatic and Dermabond was placed.  Patient was then extubated and  taken to recovery room in stable condition.     Theotis Burrow, M.D. [facility] [place] Office: [phone] Pager:  [phone]

## 2018-05-12 NOTE — OR Nursing (Signed)
Sheath pulled by Dr Trula Slade.  Perclose used on right groin.  Pressure held by Scherrie November for 20 min.  Pt right groin soft and no signs of hematoma.

## 2018-05-12 NOTE — Consult Note (Signed)
Hospital Consult    Reason for Consult:  Severe left leg pain Requesting Physician:  ER Wilson Singer) MRN #:  [id number]  History of Present Illness: This is a 42 y.o. female who is s/p prior L leg TE and L CIA stenting who presents with chief complaint: recurrent left leg pain with skin changes.  Her sx were similar to her presentation in May 2018. 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 Bridgett Larsson.    Her post operative course was complicated by left groin infection that required debridement in the OR and required a wound vac.  She states that this was removed in mid July and has essentially healed with the exception of a small fissure area.    In mid July, she was having problems with heavy vaginal bleeding and nausea.  She was on coumadin for her arterial thrombosis.  She subsequently stopped taking her coumadin.  Her INR today is 1.0.  She has had a GI illness and has not taken any of her medication.   She presents today with severe pain in her left leg.  She underwent a CT scan that reveals reocclusion of her left iliac artery.    She is diabetic and her glucose today is 319.   Her creatinine today is 1.58.  Past Medical History:  Diagnosis Date  . Anxiety   . Diabetes ([place])   . GERD (gastroesophageal reflux disease)   . History of blood clots   . Hypercalcemia   . Hyperparathyroidism   . Nephrolithiasis   . Renal calculi     Past Surgical History:  Procedure Laterality Date  . ABDOMINAL AORTOGRAM W/LOWER EXTREMITY N/A 11/14/2017   Procedure: ABDOMINAL AORTOGRAM W/LOWER EXTREMITY;  Surgeon: Waynetta Sandy, MD;  Location: [facility];   Service: Cardiovascular;  Laterality: N/A;  . ANGIOPLASTY ILLIAC ARTERY Left 02/25/2018   Procedure: BALLOON ANGIOPLASTY LEFT ILIAC ARTERY;  Surgeon: Conrad Amelia, MD;  Location: [facility];  Service: Vascular;  Laterality: Left;  . APPLICATION OF WOUND VAC Left 03/03/2018   Procedure: APPLICATION OF WOUND VAC;  Surgeon: Angelia Mould, MD;  Location: [facility];  Service: Vascular;  Laterality: Left;  . EMBOLECTOMY Left 02/24/2017   Procedure: Left Lower Extremity Embolectomy and Angiogram.;  Surgeon: Waynetta Sandy, MD;  Location: [facility];  Service: Vascular;  Laterality: Left;  . INSERTION OF ILIAC STENT  02/24/2017   Procedure: INSERTION OF Common ILIAC STENT;  Surgeon: Waynetta Sandy, MD;  Location: [facility];  Service: Vascular;;  . INTRAOPERATIVE ARTERIOGRAM Left 02/25/2018   Procedure: INTRA OPERATIVE ARTERIOGRAM WITH LEFT LEG RUNOFF;  Surgeon: Conrad [place], MD;  Location: [facility];  Service: Vascular;  Laterality: Left;  . LOWER EXTREMITY ANGIOGRAM Left 02/24/2017   Procedure: Aortagram, Left lower extremity Run-off;  Surgeon: Waynetta Sandy, MD;  Location: [facility];  Service: Vascular;  Laterality: Left;  . PATCH ANGIOPLASTY Left 02/25/2018   Procedure: PATCH ANGIOPLASTY LEFT SUPERFICIAL FEMORAL ARTERY WITH BOVINE PATCH;  Surgeon: Conrad [place], MD;  Location: [facility];  Service: Vascular;  Laterality: Left;  . 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 THROMBECTOMY FEMORAL ARTERY Left 02/25/2018   Procedure: LEFT ILIAC AND POPLITEAL ARTERY  THROMBECTOMY;  Surgeon: Conrad Yell, MD;  Location: [facility];  Service: Vascular;  Laterality: Left;  . TUBAL LIGATION    . WOUND DEBRIDEMENT Left 03/03/2018   Procedure: DEBRIDEMENT WOUND;  Surgeon: Angelia Mould, MD;  Location: [facility];  Service: Vascular;  Laterality: Left;  . WOUND EXPLORATION Left 03/03/2018   Procedure: WOUND EXPLORATION;  Surgeon: Angelia Mould, MD;  Location: [facility];  Service: Vascular;  Laterality: Left;     Allergies  Allergen Reactions  . Ciprofloxacin Hcl Hives  . Macrobid [Nitrofurantoin Macrocrystal] Hives, Shortness Of Breath and Rash  . Other Hives, Shortness Of Breath and Rash    NO "-CILLINS"!!!  . Penicillins Shortness Of Breath    Had had cephalosporins without incident  . Sulfa Antibiotics Hives, Shortness Of Breath and Rash  . Lexapro [Escitalopram Oxalate]     I just did not like it.    Prior to Admission medications   Medication Sig Start Date End Date Taking? Authorizing Provider  acetaminophen (TYLENOL) 500 MG tablet Take 2,000 mg by mouth every 6 (six) hours as needed.   Yes [provider]  aspirin EC 81 MG tablet Take 81 mg by mouth daily.   Yes [provider]  diphenhydrAMINE (BENADRYL) 25 MG tablet Take 25 mg by mouth every 6 (six) hours as needed.   Yes [provider]  ferrous sulfate 325 (65 FE) MG tablet Take 1 tablet (325 mg total) by mouth daily with breakfast. 01/31/18  Yes Ladell Pier, MD  gabapentin (NEURONTIN) 300 MG capsule TAKE 1 CAPSULE BY MOUTH 2 TIMES DAILY. Patient taking differently: Take 300 mg by mouth 4 (four) times daily.  04/04/18  Yes Ladell Pier, MD  hydrOXYzine (ATARAX/VISTARIL) 25 MG tablet 1 tab PO Q a.m, 1 Q noon and 2 Q p.m PRN Patient taking differently: Take 25 mg by mouth See admin instructions. 1 tab PO Q a.m, 1 Q noon and 2 Q p.m PRN 03/14/18  Yes Ladell Pier, MD  insulin aspart (NOVOLOG) 100 UNIT/ML injection 5-15 units subcut TID with meals Patient taking differently: Inject 5-25 Units into the skin See admin instructions. Sliding scale 04/28/17  Yes Ladell Pier, MD  insulin glargine (LANTUS) 100 UNIT/ML injection Inject 0.77 mLs (77 Units total) into the skin daily. Patient taking differently: Inject 70 Units into the skin daily.  03/14/18  Yes Ladell Pier, MD  lisinopril (PRINIVIL,ZESTRIL) 5 MG tablet Take 0.5 tablets (2.5 mg total) by mouth daily. 09/29/17  Yes Ladell Pier, MD  omeprazole (PRILOSEC) 20 MG capsule Take 2 capsules (40 mg total) by mouth daily. Patient taking differently: Take 20-40 mg by mouth daily.  09/29/17  Yes Ladell Pier, MD  ondansetron (ZOFRAN) 4 MG tablet Take 1 tablet (4 mg total) by mouth every 8 (eight) hours as needed for nausea or vomiting. 03/24/18  Yes Ulyses Amor, PA-C  traMADol (ULTRAM) 50 MG tablet Take 1 tablet (50 mg total) by mouth every 6 (six) hours as needed. 04/21/18  Yes Dagoberto Ligas, PA-C  warfarin (COUMADIN) 5 MG tablet Take 2 tablets on Monday, Wednesday and Friday with 1 tablet all other days. Patient taking differently: Take 5 mg by mouth See admin instructions. 5 mg Sun, Mon , wed,  Fri 10 mg true, Thurs sat 03/29/18  Yes Charlott Rakes, MD  blood glucose meter kit and supplies Dispense based on patient and insurance preference. Use up to four times daily as directed. (FOR  ICD-9 250.00, 250.01). 03/03/17   Cristal Ford, DO  Blood Glucose Monitoring Suppl (TRUE METRIX METER) w/Device KIT Use as directed 08/01/17   Ladell Pier, MD  buPROPion (BUPROBAN) 150 MG 12 hr tablet Take 1 tablet (150 mg total) by mouth 2 (two) times daily. 01/27/18   Ladell Pier, MD  clopidogrel (PLAVIX) 75 MG tablet Take 1 tablet (75 mg total) by mouth daily. 09/20/17   Waynetta Sandy, MD  glucose blood (TRUE METRIX BLOOD GLUCOSE TEST) test strip Use as instructed 08/01/17   Ladell Pier, MD  glucose blood test strip Use as instructed 10/10/16   Muthersbaugh, Jarrett Soho, PA-C  Insulin Pen Needle 31G X 5 MM MISC Use with Lantus and Novolog injections. 04/28/17   Ladell Pier, MD  Insulin Syringe-Needle U-100 (BD INSULIN SYRINGE ULTRAFINE) 31G X 5/16" 0.5 ML MISC Use as directed 04/28/17   Ladell Pier, MD  liraglutide (VICTOZA) 18 MG/3ML SOPN Inject 0.1 mLs (0.6 mg total) into the skin daily. 03/14/18   Ladell Pier, MD  nicotine (NICODERM CQ - DOSED IN MG/24 HOURS) 14 mg/24hr patch  Place 1 patch (14 mg total) onto the skin daily. Patient not taking: Reported on 05/12/2018 04/28/17   Ladell Pier, MD  nystatin (MYCOSTATIN) 100000 UNIT/ML suspension Take 5 mLs (500,000 Units total) by mouth 4 (four) times daily. Patient not taking: Reported on 05/12/2018 03/07/18   Ulyses Amor, PA-C  TRUEPLUS LANCETS 28G MISC Use as directed 08/01/17   Ladell Pier, MD    Social History   Socioeconomic History  . Marital status: Single    Spouse name: Not on file  . Number of children: Not on file  . Years of education: Not on file  . Highest education level: Not on file  Occupational History  . Not on file  Social Needs  . Financial resource strain: Not on file  . Food insecurity:    Worry: Not on file    Inability: Not on file  . Transportation needs:    Medical: Not on file    Non-medical: Not on file  Tobacco Use  . Smoking status: Current Some Day Smoker    Packs/day: 0.25    Years: 15.00    Pack years: 3.75    Types: Cigarettes    Last attempt to quit: 03/04/2017    Years since quitting: 1.1  . Smokeless tobacco: Never Used  Substance and Sexual Activity  . Alcohol use: No  . Drug use: Yes    Types: Marijuana    Comment: occasionally  . Sexual activity: Not on file  Lifestyle  . Physical activity:    Days per week: Not on file    Minutes per session: Not on file  . Stress: Not on file  Relationships  . Social connections:    Talks on phone: Not on file    Gets together: Not on file    Attends religious service: Not on file    Active member of club or organization: Not on file    Attends meetings of clubs or organizations: Not on file    Relationship status: Not on file  . Intimate partner violence:    Fear of current or ex partner: Not on file    Emotionally abused: Not on file    Physically abused: Not on file    Forced sexual activity: Not on file  Other Topics Concern  . Not on file  Social History Narrative  .  Not on file      Family History  Problem Relation Age of Onset  . Depression Mother   . Diabetes Father   . Heart failure Father     ROS: '[x]'$  Positive   '[ ]'$  Negative   '[ ]'$  All sytems reviewed and are negative See HPI  Physical Examination  Vitals:   05/12/18 1030 05/12/18 1045  BP:  123/67  Pulse: 92 94  Resp: (!) 30 (!) 23  Temp:    SpO2: 100% 96%   Body mass index is 42.29 kg/m.  General:  WDWN in NAD Gait: Not observed HENT: WNL, normocephalic Pulmonary: normal non-labored breathing Abdomen:  Skin: pannus on left side of abdomen and left groin reddened and inflamed.  Vascular Exam/Pulses: Doppler signal present left peroneal and PT; palpable 2+ right DP pulse Musculoskeletal: no muscle wasting or atrophy   CBC    Component Value Date/Time   WBC 17.5 (H) 05/12/2018 0613   RBC 2.81 (L) 05/12/2018 0613   HGB 7.2 (L) 05/12/2018 0613   HGB 11.0 (L) 01/27/2018 1157   HCT 25.1 (L) 05/12/2018 0613   HCT 37.6 01/27/2018 1157   PLT 642 (H) 05/12/2018 0613   PLT 490 (H) 01/27/2018 1157   MCV 89.3 05/12/2018 0613   MCV 78 (L) 01/27/2018 1157   MCH 25.6 (L) 05/12/2018 0613   MCHC 28.7 (L) 05/12/2018 0613   RDW 18.4 (H) 05/12/2018 0613   RDW 17.1 (H) 01/27/2018 1157   LYMPHSABS 2.6 05/12/2018 0613   MONOABS 0.8 05/12/2018 0613   EOSABS 0.2 05/12/2018 0613   BASOSABS 0.1 05/12/2018 0613    BMET    Component Value Date/Time   NA 135 05/12/2018 0613   NA 134 01/27/2018 1157   K 4.1 05/12/2018 0613   CL 102 05/12/2018 0613   CO2 16 (L) 05/12/2018 0613   GLUCOSE 319 (H) 05/12/2018 0613   BUN 14 05/12/2018 0613   BUN 14 01/27/2018 1157   CREATININE 1.58 (H) 05/12/2018 0613   CALCIUM 9.5 05/12/2018 0613   GFRNONAA 39 (L) 05/12/2018 0613   GFRAA 45 (L) 05/12/2018 0613    COAGS: Lab Results  Component Value Date   INR 1.01 05/12/2018   INR 2.9 04/07/2018   INR 2.8 03/24/2018     Non-Invasive Vascular Imaging:   CTA aortobifemoral 05/12/18: IMPRESSION: No acute  finding identified, however, the timing of the contrast bolus is such that this study is essentially nondiagnostic for evaluation of the arteries below the knees. Correlation with noninvasive testing would be recommended.  **An incidental finding of potential clinical significance has been found. Enlarging right adnexal/ovarian soft tissue lesion. This is incompletely characterized on the current CT though appears intermediate density, and not a simple cyst. Referral for Ob/gyn evaluation is recommended to initiate surveillance.**  Negative for pulmonary embolism.  Redemonstration of occluded left common iliac artery stent, with reconstitution of left common femoral artery. The left SFA appears patent to the knee.  The right iliac artery, common femoral artery, and right superficial femoral artery appear patent to the knee.  Redemonstration of atherosclerotic changes of the proximal left renal artery, which are better characterized on prior CT. 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  Redemonstration of left adrenal nodule, likely adenoma though incompletely characterized on the current CT.  Bilateral renal focal cortical defects, likely from prior infection/infarction. This is worst on the left.  Diverticular disease without evidence of acute diverticulitis.  Fatty liver.  Statin:  No. Beta  Blocker:  No. Aspirin:  Yes.   ACEI:  Yes.   ARB:  No. CCB use:  No Other antiplatelets/anticoagulants:  Yes.   Coumadin/Plavix **of note, she has not been taking these for a few weeks.   ASSESSMENT/PLAN: This is a 42 y.o. female with reocclusion of the left iliac stent   -CTA reveals no acute findings, however, she underwent thrombectomy of the left iliac since the CT scan and has now re-occluded.   -continue npo as she is going to the operating room this afternoon to undergo Penumbra procedure by Dr. Trula Slade.    Leontine Locket, PA-C [facility] [phone]

## 2018-05-12 NOTE — Plan of Care (Signed)
  Problem: Health Behavior/Discharge Planning: Goal: Ability to manage health-related needs will improve Outcome: Progressing   Problem: Clinical Measurements: Goal: Respiratory complications will improve Outcome: Progressing   Problem: Pain Managment: Goal: General experience of comfort will improve Outcome: Progressing   

## 2018-05-12 NOTE — Progress Notes (Addendum)
On adm report from OR stated percutaneous site rt femoral. Line pulled and rt leg needs to be straight for 4 hr. Sheet applied over lower leg to help remind pt not to bend unitl 0030

## 2018-05-12 NOTE — Progress Notes (Signed)
ANTICOAGULATION CONSULT NOTE - Initial Consult  Pharmacy Consult for heparin Indication: ischemic leg  Allergies  Allergen Reactions  . Ciprofloxacin Hcl Hives  . Macrobid [Nitrofurantoin Macrocrystal] Hives, Shortness Of Breath and Rash  . Other Hives, Shortness Of Breath and Rash    NO "-CILLINS"!!!  . Penicillins Shortness Of Breath    Had had cephalosporins without incident  . Sulfa Antibiotics Hives, Shortness Of Breath and Rash  . Lexapro [Escitalopram Oxalate]     I just did not like it.    Patient Measurements: Height: 5\' 7"  (170.2 cm) Weight: 270 lb (122.5 kg) IBW/kg (Calculated) : 61.6 Heparin Dosing Weight: 90kg  Vital Signs: Temp: 98.4 F (36.9 C) (08/09 0914) Temp Source: Oral (08/09 0914) BP: 123/67 (08/09 1045) Pulse Rate: 94 (08/09 1045)  Labs: Recent Labs    05/12/18 0613  HGB 7.2*  HCT 25.1*  PLT 642*  LABPROT 13.3  INR 1.01  CREATININE 1.58*    Estimated Creatinine Clearance: 62.3 mL/min (A) (by C-G formula based on SCr of 1.58 mg/dL (H)).   Medical History: Past Medical History:  Diagnosis Date  . Anxiety   . Diabetes ([place])   . GERD (gastroesophageal reflux disease)   . History of blood clots   . Hypercalcemia   . Hyperparathyroidism   . Nephrolithiasis   . Renal calculi     Medications:  Infusions:  . heparin 1,850 Units/hr (05/12/18 1232)    Assessment: 4 yof presented to the ED with leg pain. She was prescribed coumadin but stopped taking. Now starting heparin for ischemic leg. Baseline Hgb is low at 7.2 and platelets are elevated. INR is WNL.   Goal of Therapy:  Heparin level 0.3-0.7 units/ml Monitor platelets by anticoagulation protocol: Yes   Plan:  Heparin bolus 5000 units IV x 1 Heparin gtt 1850 units/hr Check a 6 hr heparin level Daily heparin level and CBC  Dechelle Attaway, Rande Lawman 05/12/2018,12:31 PM

## 2018-05-12 NOTE — Anesthesia Procedure Notes (Addendum)
Procedure Name: Intubation Date/Time: 05/12/2018 2:52 PM Performed by: Orlie Dakin, CRNA Pre-anesthesia Checklist: Patient identified, Emergency Drugs available, Suction available and Patient being monitored Patient Re-evaluated:Patient Re-evaluated prior to induction Oxygen Delivery Method: Circle system utilized Preoxygenation: Pre-oxygenation with 100% oxygen Induction Type: IV induction Ventilation: Mask ventilation without difficulty Laryngoscope Size: Miller and 3 Grade View: Grade II Tube type: Oral Tube size: 7.0 mm Number of attempts: 1 Airway Equipment and Method: Stylet Placement Confirmation: breath sounds checked- equal and bilateral,  positive ETCO2 and ETT inserted through vocal cords under direct vision Secured at: 21 cm Tube secured with: Tape Dental Injury: Teeth and Oropharynx as per pre-operative assessment  Comments: Noted very poor dentition in Short-Stay.  Numerous teeth missing.  Upper front right tooth loose and wiggling with touch of patient.  4x4s bite block used via left molars.

## 2018-05-12 NOTE — Anesthesia Postprocedure Evaluation (Signed)
Anesthesia Post Note  Patient: Designer, fashion/clothing  Procedure(s) Performed: MECHANICAL THROMBECTOMY LEFT LEG, BALLOON ANGIOPLASTY LEFT ANTERIOR TIBIAL ARTERY, AORTOGRAM WITH LEFT LEG RUNOFF (Left )     Patient location during evaluation: PACU Anesthesia Type: General Level of consciousness: awake and alert Pain management: pain level controlled Vital Signs Assessment: post-procedure vital signs reviewed and stable Respiratory status: spontaneous breathing, nonlabored ventilation, respiratory function stable and patient connected to nasal cannula oxygen Cardiovascular status: blood pressure returned to baseline and stable Postop Assessment: no apparent nausea or vomiting Anesthetic complications: no    Last Vitals:  Vitals:   05/12/18 2100 05/12/18 2115  BP: (!) 158/85 (!) 136/92  Pulse: 88 84  Resp: 15 14  Temp:    SpO2: 99% 100%    Last Pain:  Vitals:   05/12/18 2100  TempSrc:   PainSc: 0-No pain                 Telia Amundson DAVID

## 2018-05-12 NOTE — Anesthesia Preprocedure Evaluation (Addendum)
Anesthesia Evaluation  Patient identified by MRN, date of birth, ID band Patient awake    Reviewed: Allergy & Precautions, NPO status , Patient's Chart, lab work & pertinent test results  History of Anesthesia Complications Negative for: history of anesthetic complications  Airway Mallampati: III  TM Distance: >3 FB Neck ROM: Full    Dental  (+) Dental Advisory Given, Poor Dentition, Loose, Missing,    Pulmonary Current Smoker,   Suspected OSA undiagnosed   breath sounds clear to auscultation       Cardiovascular + Peripheral Vascular Disease   Rhythm:Regular Rate:Tachycardia   '18 TTE - Mild LVH, EF 55-60%    Neuro/Psych Anxiety Depression negative neurological ROS     GI/Hepatic GERD  Medicated and Controlled,(+)     substance abuse  marijuana use,   Endo/Other  diabetes, Poorly Controlled, Insulin DependentMorbid obesity  Renal/GU CRFRenal disease  negative genitourinary   Musculoskeletal negative musculoskeletal ROS (+)   Abdominal   Peds  Hematology  (+) anemia ,  Leukocytosis Thrombocytosis    Anesthesia Other Findings   Reproductive/Obstetrics                            Anesthesia Physical Anesthesia Plan  ASA: III and emergent  Anesthesia Plan: General   Post-op Pain Management:    Induction: Intravenous  PONV Risk Score and Plan: 3 and Treatment may vary due to age or medical condition, Ondansetron, Dexamethasone and Midazolam  Airway Management Planned: Oral ETT  Additional Equipment: Arterial line  Intra-op Plan:   Post-operative Plan: Extubation in OR  Informed Consent: I have reviewed the patients History and Physical, chart, labs and discussed the procedure including the risks, benefits and alternatives for the proposed anesthesia with the patient or authorized representative who has indicated his/her understanding and acceptance.   Dental advisory  given  Plan Discussed with: CRNA and Anesthesiologist  Anesthesia Plan Comments:        Anesthesia Quick Evaluation

## 2018-05-12 NOTE — ED Provider Notes (Addendum)
[facility] EMERGENCY DEPARTMENT Provider Note   CSN: [id number] Arrival date & time: 05/12/18  0607     History   Chief Complaint Chief Complaint  Patient presents with  . Leg Pain    HPI Terri Sparks is a 43 y.o. female.  HPI Terri Sparks is a 42 y.o. female with history of diabetes, hypertension, venous and arterial peripheral clots, PE, presents to emergency department with complaint of pain to the left leg, shortness of breath, chest pain.  Patient states her main issue today is left lower leg pain.  Patient was hospitalized in May for acute ischemic left leg.  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  She underwent thromboembolectomy of the left iliac and popliteal arteries and endarterectomy with angioplasty of the left superficial femoral artery.  Her hospitalization was complicated by wound infection.  She was discharged home with Coumadin.  Patient admits that she has not been taking Coumadin for the last 2 weeks due to a GI bug that she had initially, and now "just because I am tired."  She states pain in her legs started around 2 AM this morning.  Pain is mainly to her foot and lower calf.  She states her toes are tingling.  History of similar pain with her prior leg ischemia.  She is also admitting to a lot of anxiety, stressors, chest pain and shortness of breath.  She reports to me that she has had a blood clot in her lungs, although I am not able to find any history of that.  Patient is tearful.  Past Medical History:  Diagnosis Date  . Anxiety   . Diabetes ([place])   . GERD (gastroesophageal reflux disease)   . History of blood clots   . Hypercalcemia   . Hyperparathyroidism   . Nephrolithiasis   . Renal calculi     Patient Active Problem List   Diagnosis Date Noted  . Former smoker 03/14/2018  . 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  Thromboembolism ([place]) 02/25/2018  . Microalbuminuria 09/29/2017  . Insomnia 09/29/2017  . Immunization due 08/01/2017  . Anxiety and depression 04/28/2017  . 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 Tobacco abuse 02/25/2017  . Diabetes mellitus, type II ([place]) 02/25/2017  . Peripheral vascular disease of lower extremity ([place]) 02/24/2017  . GERD (gastroesophageal reflux disease)   . Hyperparathyroidism     Past Surgical History:  Procedure Laterality Date  . ABDOMINAL AORTOGRAM W/LOWER EXTREMITY N/A 11/14/2017   Procedure: ABDOMINAL AORTOGRAM W/LOWER EXTREMITY;  Surgeon: Waynetta Sandy, MD;  Location: [facility];  Service: Cardiovascular;  Laterality: N/A;  . ANGIOPLASTY ILLIAC ARTERY Left 02/25/2018   Procedure: BALLOON ANGIOPLASTY LEFT ILIAC ARTERY;  Surgeon: Conrad Muhlenberg [place], MD;  Location: [facility];  Service: Vascular;  Laterality: Left;  . APPLICATION OF WOUND VAC Left 03/03/2018   Procedure: APPLICATION OF WOUND VAC;  Surgeon: Angelia Mould, MD;  Location: [facility];  Service: Vascular;  Laterality: Left;  . EMBOLECTOMY Left 02/24/2017   Procedure: Left Lower Extremity Embolectomy and Angiogram.;  Surgeon: Waynetta Sandy, MD;  Location: [facility];  Service: Vascular;  Laterality: Left;  . INSERTION OF ILIAC STENT  02/24/2017   Procedure: INSERTION OF Common ILIAC STENT;  Surgeon: Waynetta Sandy, MD;  Location: [facility];  Service: Vascular;;  . INTRAOPERATIVE ARTERIOGRAM  Left 02/25/2018   Procedure: INTRA OPERATIVE ARTERIOGRAM WITH LEFT LEG RUNOFF;  Surgeon: Conrad Waukesha, MD;  Location: [facility];  Service: Vascular;  Laterality: Left;  . LOWER EXTREMITY ANGIOGRAM Left 02/24/2017   Procedure: Aortagram, Left lower extremity Run-off;  Surgeon: Waynetta Sandy, MD;  Location: [facility];  Service: Vascular;  Laterality: Left;  . PATCH ANGIOPLASTY Left 02/25/2018   Procedure: PATCH ANGIOPLASTY LEFT  SUPERFICIAL FEMORAL ARTERY WITH BOVINE PATCH;  Surgeon: Conrad Stanton, MD;  Location: [facility];  Service: Vascular;  Laterality: Left;  . 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 THROMBECTOMY FEMORAL ARTERY Left 02/25/2018   Procedure: LEFT ILIAC AND POPLITEAL ARTERY THROMBECTOMY;  Surgeon: Conrad Tetlin, MD;  Location: [facility];  Service: Vascular;  Laterality: Left;  . TUBAL LIGATION    . WOUND DEBRIDEMENT Left 03/03/2018   Procedure: DEBRIDEMENT WOUND;  Surgeon: Angelia Mould, MD;  Location: [facility];  Service: Vascular;  Laterality: Left;  . WOUND EXPLORATION Left 03/03/2018   Procedure: WOUND EXPLORATION;  Surgeon: Angelia Mould, MD;  Location: [facility];  Service: Vascular;  Laterality: Left;     OB History   None      Home Medications    Prior to Admission medications   Medication Sig Start Date End Date Taking? Authorizing Provider  blood glucose meter kit and supplies Dispense based on patient and insurance preference. Use up to four times daily as directed. (FOR ICD-9 250.00, 250.01). 03/03/17   Cristal Ford, DO  Blood Glucose Monitoring Suppl (TRUE METRIX METER) w/Device KIT Use as directed 08/01/17   Ladell Pier, MD  buPROPion (BUPROBAN) 150 MG 12 hr tablet Take 1 tablet (150 mg total) by mouth 2 (two) times daily. 01/27/18   Ladell Pier, MD  clopidogrel (PLAVIX) 75 MG tablet Take 1 tablet (75 mg total) by mouth daily. 09/20/17   Waynetta Sandy, MD  ferrous sulfate 325 (65 FE) MG tablet Take 1 tablet (325 mg total) by mouth daily with breakfast. 01/31/18   Ladell Pier, MD  gabapentin (NEURONTIN) 300 MG capsule TAKE 1 CAPSULE BY MOUTH 2 TIMES DAILY. 04/04/18   Ladell Pier, MD  glucose blood (TRUE METRIX BLOOD GLUCOSE TEST) test strip Use as instructed 08/01/17   Ladell Pier, MD  glucose blood test strip Use as instructed 10/10/16   Muthersbaugh, Jarrett Soho, PA-C  hydrOXYzine (ATARAX/VISTARIL) 25 MG tablet 1 tab PO Q a.m, 1 Q noon and 2 Q  p.m PRN 03/14/18   Ladell Pier, MD  insulin aspart (NOVOLOG) 100 UNIT/ML injection 5-15 units subcut TID with meals 04/28/17   Ladell Pier, MD  insulin glargine (LANTUS) 100 UNIT/ML injection Inject 0.77 mLs (77 Units total) into the skin daily. 03/14/18   Ladell Pier, MD  Insulin Pen Needle 31G X 5 MM MISC Use with Lantus and Novolog injections. 04/28/17   Ladell Pier, MD  Insulin Syringe-Needle U-100 (BD INSULIN SYRINGE ULTRAFINE) 31G X 5/16" 0.5 ML MISC Use as directed 04/28/17   Ladell Pier, MD  liraglutide (VICTOZA) 18 MG/3ML SOPN Inject 0.1 mLs (0.6 mg total) into the skin daily. 03/14/18   Ladell Pier, MD  lisinopril (PRINIVIL,ZESTRIL) 5 MG tablet Take 0.5 tablets (2.5 mg total) by mouth daily. 09/29/17   Ladell Pier, MD  naproxen sodium (ALEVE) 220 MG tablet Take 220 mg by mouth daily as needed (pain).    [provider]  nicotine (NICODERM CQ - DOSED  IN MG/24 HOURS) 14 mg/24hr patch Place 1 patch (14 mg total) onto the skin daily. 04/28/17   Ladell Pier, MD  nystatin (MYCOSTATIN) 100000 UNIT/ML suspension Take 5 mLs (500,000 Units total) by mouth 4 (four) times daily. 03/07/18   Ulyses Amor, PA-C  omeprazole (PRILOSEC) 20 MG capsule Take 2 capsules (40 mg total) by mouth daily. 09/29/17   Ladell Pier, MD  ondansetron (ZOFRAN) 4 MG tablet Take 1 tablet (4 mg total) by mouth every 8 (eight) hours as needed for nausea or vomiting. 03/24/18   Ulyses Amor, PA-C  traMADol (ULTRAM) 50 MG tablet Take 1 tablet (50 mg total) by mouth every 6 (six) hours as needed. 04/21/18   Dagoberto Ligas, PA-C  TRUEPLUS LANCETS 28G MISC Use as directed 08/01/17   Ladell Pier, MD  warfarin (COUMADIN) 5 MG tablet Take 2 tablets on Monday, Wednesday and Friday with 1 tablet all other days. 03/29/18   Charlott Rakes, MD    Family History Family History  Problem Relation Age of Onset  . Depression Mother   . Diabetes Father   . Heart  failure Father     Social History Social History   Tobacco Use  . Smoking status: Current Some Day Smoker    Packs/day: 0.25    Years: 15.00    Pack years: 3.75    Types: Cigarettes    Last attempt to quit: 03/04/2017    Years since quitting: 1.1  . Smokeless tobacco: Never Used  Substance Use Topics  . Alcohol use: No  . Drug use: Yes    Types: Marijuana    Comment: occasionally     Allergies   Ciprofloxacin hcl; Macrobid [nitrofurantoin macrocrystal]; Other; Penicillins; Sulfa antibiotics; and Lexapro [escitalopram oxalate]   Review of Systems Review of Systems  Constitutional: Negative for chills and fever.  Respiratory: Positive for chest tightness and shortness of breath. Negative for cough.   Cardiovascular: Positive for chest pain. Negative for palpitations and leg swelling.  Gastrointestinal: Negative for abdominal pain, diarrhea, nausea and vomiting.  Genitourinary: Negative for dysuria, flank pain, pelvic pain, vaginal bleeding, vaginal discharge and vaginal pain.  Musculoskeletal: Positive for arthralgias and myalgias. Negative for neck pain and neck stiffness.  Skin: Negative for rash.  Neurological: Positive for numbness. Negative for dizziness, weakness and headaches.  All other systems reviewed and are negative.    Physical Exam Updated Vital Signs BP (!) 130/97   Temp 97.9 F (36.6 C) (Oral)   Ht _0  (1.702 m)   Wt 122.5 kg   SpO2 100%   BMI 42.29 kg/m   Physical Exam  Constitutional: She appears well-developed and well-nourished. No distress.  HENT:  Head: Normocephalic.  Eyes: Conjunctivae are normal.  Neck: Neck supple.  Cardiovascular: Normal rate, regular rhythm and normal heart sounds.  Pulmonary/Chest: Effort normal and breath sounds normal. No respiratory distress. She has no wheezes. She has no rales.  Abdominal: Soft. Bowel sounds are normal. She exhibits no distension. There is no tenderness. There is no rebound.    Musculoskeletal: She exhibits no edema.  Capillary refill less than 2 seconds to her left toes, however foot is cool.  It is pink.  I am unable to palpate dorsalis pedis or posterior tibial pulses.  Neurological: She is alert.  Skin: Skin is warm and dry.  Psychiatric: She has a normal mood and affect. Her behavior is normal.  Nursing note and vitals reviewed.    ED Treatments / Results  Labs (all labs ordered are listed, but only abnormal results are displayed) Labs Reviewed  CBC WITH DIFFERENTIAL/PLATELET - Abnormal; Notable for the following components:      Result Value   WBC 17.5 (*)    RBC 2.81 (*)    Hemoglobin 7.2 (*)    HCT 25.1 (*)    MCH 25.6 (*)    MCHC 28.7 (*)    RDW 18.4 (*)    Platelets 642 (*)    Neutro Abs 13.6 (*)    All other components within normal limits  COMPREHENSIVE METABOLIC PANEL - Abnormal; Notable for the following components:   CO2 16 (*)    Glucose, Bld 319 (*)    Creatinine, Ser 1.58 (*)    Albumin 3.4 (*)    AST 14 (*)    GFR calc non Af Amer 39 (*)    GFR calc Af Amer 45 (*)    Anion gap 17 (*)    All other components within normal limits  GLUCOSE, CAPILLARY - Abnormal; Notable for the following components:   Glucose-Capillary 200 (*)    All other components within normal limits  I-STAT CG4 LACTIC ACID, ED - Abnormal; Notable for the following components:   Lactic Acid, Venous 3.21 (*)    All other components within normal limits  SURGICAL PCR SCREEN  PROTIME-INR  I-STAT BETA HCG BLOOD, ED ([facility], [facility], [facility] ONLY)  I-STAT CG4 LACTIC ACID, ED    EKG EKG Interpretation  Date/Time:  Friday May 12 2018 06:35:51 EDT Ventricular Rate:  106 PR Interval:    QRS Duration: 83 QT Interval:  366 QTC Calculation: 486 R Axis:   39 Text Interpretation:  Sinus tachycardia Low voltage, precordial leads Borderline prolonged QT interval When compared with ECG of 11/14/2017, QT has lengthened Confirmed by Delora Fuel ([id number]) on 05/12/2018 7:02:51  AM   Radiology Ct Angio Chest Pe W And/or Wo Contrast  Result Date: 05/12/2018 CLINICAL DATA:  42 year old female with a history of left leg pain and shortness of breath EXAM: CT ANGIOGRAPHY CHEST, ABDOMEN AND PELVIS TECHNIQUE: Multidetector CT imaging through the chest, abdomen and pelvis was performed using the standard protocol during bolus administration of intravenous contrast. Multiplanar reconstructed images and MIPs were obtained and reviewed to evaluate the vascular anatomy. CONTRAST:  163m ISOVUE-370 IOPAMIDOL (ISOVUE-370) INJECTION 76% COMPARISON:  02/25/2018, 03/01/2017, 02/24/2017 FINDINGS: CTA CHEST FINDINGS Cardiovascular: Heart: No cardiomegaly. No pericardial fluid/thickening. No significant coronary calcifications. Aorta: Unremarkable course, caliber, contour of the thoracic aorta. No aneurysm or dissection flap. No periaortic fluid. Pulmonary arteries: No central, lobar, segmental, or proximal subsegmental filling defects. Distally, the contrast bolus is ill timed for a sensitive evaluation. Mediastinum/Nodes: No mediastinal adenopathy. Unremarkable appearance of the thoracic esophagus. Unremarkable appearance of the thoracic inlet and thyroid. Lungs/Pleura: Central airways are clear. No pleural effusion. No confluent airspace disease. No pneumothorax. Adjacent nodules of the right lower lobe, 8 mm and 4 mm, next to the fissure. These were present on the comparison CT of 03/01/2017, and discussed as have been stable since 2015 most likely subpleural lymph nodes. Musculoskeletal: No acute displaced fracture. Degenerative changes of the spine. Review of the MIP images confirms the above findings. CTA ABDOMEN AND PELVIS FINDINGS VASCULAR Note that the bolus timing of this current CT angiogram is ill timed, which limits the sensitivity of the examination. Aorta: Unremarkable course, caliber, contour of the abdominal aorta. No dissection, aneurysm, or periaortic fluid. No significant  atherosclerotic changes. Celiac: Celiac artery is patent.  Branches are patent. SMA: SMA is patent. Renals: Right renal artery is patent without significant calcified atherosclerosis at the origin. The left renal artery is poorly characterized, though was stenotic on the prior CT with changes of the left renal cortex. IMA: IMA is patent. Right lower extremity: No significant calcified atherosclerosis of the right iliac system. Common iliac artery, hypogastric artery, external iliac artery appears patent. Common femoral artery appears patent. Superficial femoral artery appears patent to the popliteal artery. Assessment of the below knee arteries is limited by the contrast bolus. Left lower extremity: Mild calcifications of the left common iliac artery, with redemonstration of stent of the common iliac artery. Redemonstration of stent occlusion, with the occlusion extending across the origin of the left hypogastric artery. There does appear to be reconstitution of the distal external iliac artery. Assessment of the common femoral artery is limited secondary to timing of the contrast bolus. The left superficial femoral artery and the profunda femoris remain patent at the origin. The left SFA is patent to the popliteal artery. Surgical changes in the left popliteal region. The distal popliteal artery and below knee arteries on the left are not assessed on the current CT. Veins: Unremarkable appearance of the venous system, which is incompletely opacified. Review of the MIP images confirms the above findings. NON-VASCULAR Hepatobiliary: Diffusely decreased attenuation/enhancement of liver parenchyma. Right liver lobe measures at least 17 cm in craniocaudal span. Unremarkable gallbladder Pancreas: Unremarkable pancreas Spleen: Unremarkable spleen Adrenals/Urinary Tract: Unremarkable right adrenal gland. Left adrenal nodule again demonstrated measuring 15 mm. This lesion is incompletely characterized on the current CT and  remains unchanged in size over time. Right: No hydronephrosis. Symmetric perfusion to the left. No nephrolithiasis. Irregularity of the lateral cortex of the right kidney with mild cortical thinning. Unremarkable course of the right ureter. Left: No left hydronephrosis. No definite nephrolithiasis. Regions of focal cortical thinning of the left kidney, similar to prior. Unremarkable course of the left ureter. Unremarkable appearance of the urinary bladder . Stomach/Bowel: Unremarkable appearance of the stomach. Unremarkable appearance of small bowel. No evidence of obstruction. Colonic diverticula. No associated inflammatory changes. Normal appendix. Lymphatic: No lymphadenopathy Mesenteric: No free fluid or air. No adenopathy. Reproductive: Unremarkable appearance of the uterus. Right adnexal lesion with Hounsfield units measuring 26. Greatest diameter 3.5 cm. Other: No hernia. Musculoskeletal: No evidence of acute fracture. No bony canal narrowing. No significant degenerative changes of the hips. . IMPRESSION: No acute finding identified, however, the timing of the contrast bolus is such that this study is essentially nondiagnostic for evaluation of the arteries below the knees. Correlation with noninvasive testing would be recommended. **An incidental finding of potential clinical significance has been found. Enlarging right adnexal/ovarian soft tissue lesion. This is incompletely characterized on the current CT though appears intermediate density, and not a simple cyst. Referral for Ob/gyn evaluation is recommended to initiate surveillance.** Negative for pulmonary embolism. Redemonstration of occluded left common iliac artery stent, with reconstitution of left common femoral artery. The left SFA appears patent to the knee. The right iliac artery, common femoral artery, and right superficial femoral artery appear patent to the knee. Redemonstration of atherosclerotic changes of the proximal left renal artery,  which are better characterized on prior CT. 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 Redemonstration of left adrenal nodule, likely adenoma though incompletely characterized on the current CT. Bilateral renal focal cortical defects, likely from prior infection/infarction. This is worst on the left. Diverticular disease without evidence of acute diverticulitis. Fatty  liver. Signed, Dulcy Fanny. Dellia Nims, RPVI [facility] Radiology Electronically Signed   By: Corrie Mckusick D.O.   On: 05/12/2018 10:00   Ct Angio Aortobifemoral W And/or Wo Contrast  Result Date: 05/12/2018 CLINICAL DATA:  42 year old female with a history of left leg pain and shortness of breath EXAM: CT ANGIOGRAPHY CHEST, ABDOMEN AND PELVIS TECHNIQUE: Multidetector CT imaging through the chest, abdomen and pelvis was performed using the standard protocol during bolus administration of intravenous contrast. Multiplanar reconstructed images and MIPs were obtained and reviewed to evaluate the vascular anatomy. CONTRAST:  165m ISOVUE-370 IOPAMIDOL (ISOVUE-370) INJECTION 76% COMPARISON:  02/25/2018, 03/01/2017, 02/24/2017 FINDINGS: CTA CHEST FINDINGS Cardiovascular: Heart: No cardiomegaly. No pericardial fluid/thickening. No significant coronary calcifications. Aorta: Unremarkable course, caliber, contour of the thoracic aorta. No aneurysm or dissection flap. No periaortic fluid. Pulmonary arteries: No central, lobar, segmental, or proximal subsegmental filling defects. Distally, the contrast bolus is ill timed for a sensitive evaluation. Mediastinum/Nodes: No mediastinal adenopathy. Unremarkable appearance of the thoracic esophagus. Unremarkable appearance of the thoracic inlet and thyroid. Lungs/Pleura: Central airways are clear. No pleural effusion. No confluent airspace disease. No pneumothorax. Adjacent nodules of the right lower lobe, 8 mm and 4 mm, next to the fissure. These were  present on the comparison CT of 03/01/2017, and discussed as have been stable since 2015 most likely subpleural lymph nodes. Musculoskeletal: No acute displaced fracture. Degenerative changes of the spine. Review of the MIP images confirms the above findings. CTA ABDOMEN AND PELVIS FINDINGS VASCULAR Note that the bolus timing of this current CT angiogram is ill timed, which limits the sensitivity of the examination. Aorta: Unremarkable course, caliber, contour of the abdominal aorta. No dissection, aneurysm, or periaortic fluid. No significant atherosclerotic changes. Celiac: Celiac artery is patent.  Branches are patent. SMA: SMA is patent. Renals: Right renal artery is patent without significant calcified atherosclerosis at the origin. The left renal artery is poorly characterized, though was stenotic on the prior CT with changes of the left renal cortex. IMA: IMA is patent. Right lower extremity: No significant calcified atherosclerosis of the right iliac system. Common iliac artery, hypogastric artery, external iliac artery appears patent. Common femoral artery appears patent. Superficial femoral artery appears patent to the popliteal artery. Assessment of the below knee arteries is limited by the contrast bolus. Left lower extremity: Mild calcifications of the left common iliac artery, with redemonstration of stent of the common iliac artery. Redemonstration of stent occlusion, with the occlusion extending across the origin of the left hypogastric artery. There does appear to be reconstitution of the distal external iliac artery. Assessment of the common femoral artery is limited secondary to timing of the contrast bolus. The left superficial femoral artery and the profunda femoris remain patent at the origin. The left SFA is patent to the popliteal artery. Surgical changes in the left popliteal region. The distal popliteal artery and below knee arteries on the left are not assessed on the current CT. Veins:  Unremarkable appearance of the venous system, which is incompletely opacified. Review of the MIP images confirms the above findings. NON-VASCULAR Hepatobiliary: Diffusely decreased attenuation/enhancement of liver parenchyma. Right liver lobe measures at least 17 cm in craniocaudal span. Unremarkable gallbladder Pancreas: Unremarkable pancreas Spleen: Unremarkable spleen Adrenals/Urinary Tract: Unremarkable right adrenal gland. Left adrenal nodule again demonstrated measuring 15 mm. This lesion is incompletely characterized on the current CT and remains unchanged in size over time. Right: No hydronephrosis. Symmetric perfusion to the left. No nephrolithiasis.  Irregularity of the lateral cortex of the right kidney with mild cortical thinning. Unremarkable course of the right ureter. Left: No left hydronephrosis. No definite nephrolithiasis. Regions of focal cortical thinning of the left kidney, similar to prior. Unremarkable course of the left ureter. Unremarkable appearance of the urinary bladder . Stomach/Bowel: Unremarkable appearance of the stomach. Unremarkable appearance of small bowel. No evidence of obstruction. Colonic diverticula. No associated inflammatory changes. Normal appendix. Lymphatic: No lymphadenopathy Mesenteric: No free fluid or air. No adenopathy. Reproductive: Unremarkable appearance of the uterus. Right adnexal lesion with Hounsfield units measuring 26. Greatest diameter 3.5 cm. Other: No hernia. Musculoskeletal: No evidence of acute fracture. No bony canal narrowing. No significant degenerative changes of the hips. . IMPRESSION: No acute finding identified, however, the timing of the contrast bolus is such that this study is essentially nondiagnostic for evaluation of the arteries below the knees. Correlation with noninvasive testing would be recommended. **An incidental finding of potential clinical significance has been found. Enlarging right adnexal/ovarian soft tissue lesion. This is  incompletely characterized on the current CT though appears intermediate density, and not a simple cyst. Referral for Ob/gyn evaluation is recommended to initiate surveillance.** Negative for pulmonary embolism. Redemonstration of occluded left common iliac artery stent, with reconstitution of left common femoral artery. The left SFA appears patent to the knee. The right iliac artery, common femoral artery, and right superficial femoral artery appear patent to the knee. Redemonstration of atherosclerotic changes of the proximal left renal artery, which are better characterized on prior CT. 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 Redemonstration of left adrenal nodule, likely adenoma though incompletely characterized on the current CT. Bilateral renal focal cortical defects, likely from prior infection/infarction. This is worst on the left. Diverticular disease without evidence of acute diverticulitis. Fatty liver. Signed, Dulcy Fanny. Dellia Nims, RPVI [facility] Electronically Signed   By: Corrie Mckusick D.O.   On: 05/12/2018 10:00    Procedures Procedures (including critical care time)  CRITICAL CARE Performed by: Judy Pollman Total critical care time: 30 minutes Critical care time was exclusive of separately billable procedures and treating other patients. Critical care was necessary to treat or prevent imminent or life-threatening deterioration. Critical care was time spent personally by me on the following activities: development of treatment plan with patient and/or surrogate as well as nursing, discussions with consultants, evaluation of patient's response to treatment, examination of patient, obtaining history from patient or surrogate, ordering and performing treatments and interventions, ordering and review of laboratory studies, ordering and review of radiographic studies, pulse oximetry and re-evaluation of  patient's condition.  Medications Ordered in ED Medications  LORazepam (ATIVAN) injection 0.5 mg (has no administration in time range)     Initial Impression / Assessment and Plan / ED Course  I have reviewed the triage vital signs and the nursing notes.  Pertinent labs & imaging results that were available during my care of the patient were reviewed by me and considered in my medical decision making (see chart for details).     Patient with possible recurrent left leg ischemia, I am unable to find pulses, however foot is pink and has capillary refill that is 2 seconds.  We will get CT angio to see if she has re-clotted the leg.  She is also complaining of chest pain shortness of breath.  She appears to be very anxious, she is requesting anxiety medications.  I will give her some Ativan.  She is  tachycardic, and states she is currently having chest pain.  I will get EKG and CT Angio of her chest to rule out PE. Since  CT scans both with no acute findings, however unable to see below the knee due to contrast timing. Persistent occlusion of common iliac and based on hx pt had thrombectomy, wonder if new reocclusion. Pt continues to have pain. Discussed with Vascular surgery, asked for heparin and NPO.   Pt also found to be anemic. She is asymptomatic for her anemia, and no active bleeding. States she has heavy menstrual clots. Discussed with her finidng of adnexal mass on CT and close outpatient follow up with her PCP or OB/GYN. Be careful taking blood thinners if hemorrhaging.   Vitals:   05/12/18 1045 05/12/18 1215 05/12/18 1230 05/12/18 1245  BP: 123/67 130/74 118/67 127/65  Pulse: 94 92 95 98  Resp: (!) _0 (!) 21  Temp:      TempSrc:      SpO2: 96% 100% 98% 97%  Weight:      Height:        Final Clinical Impressions(s) / ED Diagnoses   Final diagnoses:  Left leg pain  Ischemic foot  Anemia, unspecified type  Hyperglycemia    ED Discharge Orders    None        Janee Morn 05/12/18 1606    Virgel Manifold, MD 05/14/18 1056    Jeannett Senior, PA-C 06/03/18 1357    Virgel Manifold, MD 06/04/18 1103

## 2018-05-12 NOTE — Progress Notes (Signed)
Patient admitted from OR. Very sleepy and drowsy. Incision site clean and no swelling or redness. Vitals stable. Skin assessed times two nurses. Telemetry applied and verified with nurse staff. Patient on heparin drip and having vaginal bleeding. Per PACU nurse, MD aware. Patient denies pain at this time.

## 2018-05-12 NOTE — Transfer of Care (Signed)
Immediate Anesthesia Transfer of Care Note  Patient: Terri Sparks  Procedure(s) Performed: MECHANICAL THROMBECTOMY LEFT LEG, BALLOON ANGIOPLASTY LEFT ANTERIOR TIBIAL ARTERY, AORTOGRAM WITH LEFT LEG RUNOFF (Left )  Patient Location: PACU  Anesthesia Type:General  Level of Consciousness: awake and alert   Airway & Oxygen Therapy: Patient Spontanous Breathing and Patient connected to face mask oxygen  Post-op Assessment: Report given to RN and Post -op Vital signs reviewed and stable  Post vital signs: Reviewed and stable  Last Vitals:  Vitals Value Taken Time  BP 144/85 05/12/2018  8:42 PM  Temp    Pulse 93 05/12/2018  8:47 PM  Resp 17 05/12/2018  8:47 PM  SpO2 100 % 05/12/2018  8:47 PM  Vitals shown include unvalidated device data.  Last Pain:  Vitals:   05/12/18 1401  TempSrc:   PainSc: 10-Worst pain ever      Patients Stated Pain Goal: 3 (42/87/68 1157)  Complications: No apparent anesthesia complications

## 2018-05-13 ENCOUNTER — Encounter (HOSPITAL_COMMUNITY): Payer: Medicaid Other

## 2018-05-13 ENCOUNTER — Inpatient Hospital Stay (HOSPITAL_COMMUNITY): Payer: Medicaid Other

## 2018-05-13 LAB — CBC
HCT: 25.3 % — ABNORMAL LOW (ref 36.0–46.0)
Hemoglobin: 7.7 g/dL — ABNORMAL LOW (ref 12.0–15.0)
MCH: 26.9 pg (ref 26.0–34.0)
MCHC: 30.4 g/dL (ref 30.0–36.0)
MCV: 88.5 fL (ref 78.0–100.0)
Platelets: 421 10*3/uL — ABNORMAL HIGH (ref 150–400)
RBC: 2.86 MIL/uL — ABNORMAL LOW (ref 3.87–5.11)
RDW: 17.1 % — ABNORMAL HIGH (ref 11.5–15.5)
WBC: 15.7 10*3/uL — ABNORMAL HIGH (ref 4.0–10.5)

## 2018-05-13 LAB — BASIC METABOLIC PANEL
Anion gap: 7 (ref 5–15)
BUN: 10 mg/dL (ref 6–20)
CO2: 21 mmol/L — ABNORMAL LOW (ref 22–32)
Calcium: 8.5 mg/dL — ABNORMAL LOW (ref 8.9–10.3)
Chloride: 108 mmol/L (ref 98–111)
Creatinine, Ser: 1.37 mg/dL — ABNORMAL HIGH (ref 0.44–1.00)
GFR calc Af Amer: 54 mL/min — ABNORMAL LOW (ref >60)
GFR calc non Af Amer: 46 mL/min — ABNORMAL LOW (ref >60)
Glucose, Bld: 291 mg/dL — ABNORMAL HIGH (ref 70–99)
Potassium: 4.7 mmol/L (ref 3.5–5.1)
Sodium: 136 mmol/L (ref 135–145)

## 2018-05-13 LAB — HEPARIN LEVEL (UNFRACTIONATED)
Heparin Unfractionated: 1.6 IU/mL — ABNORMAL HIGH (ref 0.30–0.70)
Heparin Unfractionated: 0.76 IU/mL — ABNORMAL HIGH (ref 0.30–0.70)
Heparin Unfractionated: <0.1 IU/mL — ABNORMAL LOW (ref 0.30–0.70)

## 2018-05-13 LAB — GLUCOSE, CAPILLARY
Glucose-Capillary: 334 mg/dL — ABNORMAL HIGH (ref 70–99)
Glucose-Capillary: 257 mg/dL — ABNORMAL HIGH (ref 70–99)
Glucose-Capillary: 284 mg/dL — ABNORMAL HIGH (ref 70–99)
Glucose-Capillary: 283 mg/dL — ABNORMAL HIGH (ref 70–99)

## 2018-05-13 MED ORDER — INSULIN GLARGINE 100 UNIT/ML ~~LOC~~ SOLN: 70.0000 [IU] | Freq: Every day | SUBCUTANEOUS | Status: DC

## 2018-05-13 MED ORDER — NYSTATIN 100000 UNIT/GM EX POWD
Freq: Three times a day (TID) | CUTANEOUS | Status: DC
  Administered 2018-05-13 (×2): via TOPICAL
  Administered 2018-05-13: 1 via TOPICAL
  Administered 2018-05-14 – 2018-05-19 (×15): via TOPICAL
  Filled 2018-05-13: qty 15

## 2018-05-13 MED ORDER — HEPARIN (PORCINE) IN NACL 100-0.45 UNIT/ML-% IJ SOLN
2200.0000 [IU]/h | INTRAMUSCULAR | Status: DC
  Administered 2018-05-13 – 2018-05-14 (×3): 1750 [IU]/h via INTRAVENOUS
  Administered 2018-05-15: 1900 [IU]/h via INTRAVENOUS
  Administered 2018-05-15: 1750 [IU]/h via INTRAVENOUS
  Administered 2018-05-16 – 2018-05-17 (×2): 1900 [IU]/h via INTRAVENOUS
  Administered 2018-05-17 – 2018-05-19 (×4): 2200 [IU]/h via INTRAVENOUS
  Filled 2018-05-13 (×9): qty 250

## 2018-05-13 MED ORDER — NICOTINE 7 MG/24HR TD PT24
7.0000 mg | MEDICATED_PATCH | Freq: Every day | TRANSDERMAL | Status: DC
Start: 2018-05-13 — End: 2018-05-19
  Administered 2018-05-13 – 2018-05-19 (×7): 7 mg via TRANSDERMAL
  Filled 2018-05-13 (×7): qty 1

## 2018-05-13 MED ORDER — WARFARIN SODIUM 2.5 MG PO TABS
12.5000 mg | ORAL_TABLET | Freq: Once | ORAL | Status: AC
  Administered 2018-05-13: 12.5 mg via ORAL
  Filled 2018-05-13: qty 1

## 2018-05-13 MED ORDER — NICOTINE 21 MG/24HR TD PT24: 21.0000 mg | MEDICATED_PATCH | Freq: Every day | TRANSDERMAL | Status: DC

## 2018-05-13 MED ORDER — INSULIN ASPART 100 UNIT/ML ~~LOC~~ SOLN
0.0000 [IU] | Freq: Three times a day (TID) | SUBCUTANEOUS | Status: DC
  Administered 2018-05-13: 8 [IU] via SUBCUTANEOUS
  Administered 2018-05-13: 5 [IU] via SUBCUTANEOUS
  Administered 2018-05-14 (×2): 8 [IU] via SUBCUTANEOUS
  Administered 2018-05-14: 3 [IU] via SUBCUTANEOUS
  Administered 2018-05-15: 5 [IU] via SUBCUTANEOUS
  Administered 2018-05-15: 3 [IU] via SUBCUTANEOUS
  Administered 2018-05-15 – 2018-05-16 (×3): 5 [IU] via SUBCUTANEOUS
  Administered 2018-05-16: 3 [IU] via SUBCUTANEOUS
  Administered 2018-05-17 (×3): 8 [IU] via SUBCUTANEOUS
  Administered 2018-05-18: 3 [IU] via SUBCUTANEOUS
  Administered 2018-05-18 (×2): 8 [IU] via SUBCUTANEOUS
  Administered 2018-05-19: 5 [IU] via SUBCUTANEOUS
  Administered 2018-05-19: 3 [IU] via SUBCUTANEOUS

## 2018-05-13 MED ORDER — INSULIN GLARGINE 100 UNIT/ML ~~LOC~~ SOLN
35.0000 [IU] | Freq: Every day | SUBCUTANEOUS | Status: DC
  Administered 2018-05-13 – 2018-05-19 (×7): 35 [IU] via SUBCUTANEOUS
  Filled 2018-05-13 (×7): qty 0.35

## 2018-05-13 MED ORDER — WARFARIN - PHARMACIST DOSING INPATIENT
Freq: Every day | Status: DC
Start: 2018-05-13 — End: 2018-05-19
  Administered 2018-05-18: 18:00:00

## 2018-05-13 NOTE — Progress Notes (Signed)
[place] for heparin and warfarin Indication: ischemic leg, arterial thromboembolus  Allergies  Allergen Reactions  . Ciprofloxacin Hcl Hives  . Macrobid [Nitrofurantoin Macrocrystal] Hives, Shortness Of Breath and Rash  . Other Hives, Shortness Of Breath and Rash    NO "-CILLINS"!!!  . Penicillins Shortness Of Breath    Had had cephalosporins without incident  . Sulfa Antibiotics Hives, Shortness Of Breath and Rash  . Lexapro [Escitalopram Oxalate]     I just did not like it.    Patient Measurements: Height: 5\' 7"  (170.2 cm) Weight: 274 lb 0.5 oz (124.3 kg) IBW/kg (Calculated) : 61.6 Heparin Dosing Weight: 91.2 kg  Vital Signs: Temp: 98 F (36.7 C) (08/10 0818) Temp Source: Oral (08/10 0818) BP: 129/69 (08/10 0818) Pulse Rate: 100 (08/10 0818)  Labs: Recent Labs    05/12/18 0613 05/12/18 1741 05/12/18 1949 05/13/18 0022 05/13/18 0906  HGB 7.2* 6.1* 8.2* 7.7*  --   HCT 25.1* 18.0* 24.0* 25.3*  --   PLT 642*  --   --  421*  --   LABPROT 13.3  --   --   --   --   INR 1.01  --   --   --   --   HEPARINUNFRC  --   --   --  1.60* 0.76*  CREATININE 1.58*  --   --  1.37*  --     Estimated Creatinine Clearance: 72.5 mL/min (A) (by C-G formula based on SCr of 1.37 mg/dL (H)).   Medical History: Past Medical History:  Diagnosis Date  . Anxiety   . Diabetes ([place])   . GERD (gastroesophageal reflux disease)   . History of blood clots   . Hypercalcemia   . Hyperparathyroidism   . Nephrolithiasis   . Renal calculi     Medications:  Infusions:  . sodium chloride    . sodium chloride 10 mL/hr at 05/13/18 0433  . heparin 1,850 Units/hr (05/13/18 0433)  . magnesium sulfate 1 - 4 g bolus IVPB    . vancomycin 1,000 mg (05/13/18 0226)    Assessment: 41 yof presented to the ED with leg pain. She was prescribed coumadin at 10 mg T/Th/Sat and 5 mg all other days, but stopped taking. Now on heparin and restarting warfarin for  ischemic leg and arterial thromboembolism s/p thrombectomy and angioplasty on 8/9.   -Hgb = 7.7 (up from 6.1), vaginal bleeding has been noted -2 units PRBC given in OR -heparin level= 0.76 -INR 1.01  On previous admission (May-June 2019), patient required several days of warfarin 12.5 mg and 10 mg to reach therapeutic INR.  Goal of Therapy:  INR 2-3 Heparin level 0.3-0.7 units/ml Monitor platelets by anticoagulation protocol: Yes   Plan:   -Decrease heparin to 1,750 units/hr -Start warfarin 12.5 mg x 1 -Heparin level in 6 hours and daily -Daily INR and CBC  Vertis Kelch, PharmD PGY1 Pharmacy Resident Phone [phone] 05/13/2018       11:48 AM

## 2018-05-13 NOTE — Progress Notes (Addendum)
Progress Note    05/13/2018 8:27 AM 1 Day Post-Op  Subjective:  Says her leg feels better.  She is having some pain but says she only had pain medication one time over night.  She has been up to the bathroom without difficulty.  Says she has felt pretty bad over the past 3 weeks.  Feels a little bit better now.   Afebrile HR  80's-120's NSR/ST 409'W-119'J systolic 47% RA  Vitals:   05/13/18 0200 05/13/18 0818  BP:  129/69  Pulse: (!) 107 100  Resp: 20 15  Temp:  98 F (36.7 C)  SpO2: 100% 99%    Physical Exam: Cardiac:  regular Lungs:  Non labored Incisions:  Right groin is soft without hematoma; left groin with erythema Extremities:  + doppler signal present left PT/peroneal; left foot is warm; motor and sensory are in tact.    CBC    Component Value Date/Time   WBC 15.7 (H) 05/13/2018 0022   RBC 2.86 (L) 05/13/2018 0022   HGB 7.7 (L) 05/13/2018 0022   HGB 11.0 (L) 01/27/2018 1157   HCT 25.3 (L) 05/13/2018 0022   HCT 37.6 01/27/2018 1157   PLT 421 (H) 05/13/2018 0022   PLT 490 (H) 01/27/2018 1157   MCV 88.5 05/13/2018 0022   MCV 78 (L) 01/27/2018 1157   MCH 26.9 05/13/2018 0022   MCHC 30.4 05/13/2018 0022   RDW 17.1 (H) 05/13/2018 0022   RDW 17.1 (H) 01/27/2018 1157   LYMPHSABS 2.6 05/12/2018 0613   MONOABS 0.8 05/12/2018 0613   EOSABS 0.2 05/12/2018 0613   BASOSABS 0.1 05/12/2018 0613    BMET    Component Value Date/Time   NA 136 05/13/2018 0022   NA 134 01/27/2018 1157   K 4.7 05/13/2018 0022   CL 108 05/13/2018 0022   CO2 21 (L) 05/13/2018 0022   GLUCOSE 291 (H) 05/13/2018 0022   BUN 10 05/13/2018 0022   BUN 14 01/27/2018 1157   CREATININE 1.37 (H) 05/13/2018 0022   CALCIUM 8.5 (L) 05/13/2018 0022   GFRNONAA 46 (L) 05/13/2018 0022   GFRAA 54 (L) 05/13/2018 0022    INR    Component Value Date/Time   INR 1.01 05/12/2018 0613     Intake/Output Summary (Last 24 hours) at 05/13/2018 0827 Last data filed at 05/13/2018 0343 Gross per 24 hour   Intake 3580 ml  Output 2375 ml  Net 1205 ml     Assessment:  42 y.o. female is s/p:  Procedure Performed: 1.  Ultrasound-guided access, right femoral artery 2.  Abdominal aortogram 3.  Left leg runoff 4.  Mechanical thrombectomy using the Penumbra device of the left common iliac, left external iliac, left common femoral, left profundofemoral, left superficial femoral, left popliteal, left anterior tibial, and left peroneal artery 5.  Balloon angioplasty, left anterior tibial artery 6.  Intra-arterial administration of TPA and nitroglycerin 7.  Closure device (Proglide)  1 Day Post-Op  Plan: -pt doing well this morning with doppler signals present left PT/peroneal; motor and sensation are in tact -continue heparin for now-will need to switch to oral AC.  She is followed by Lurena Joiner in Dr. Durenda Age office for INR.  She may need outpatient referral for hypercoagulable workup. -CT scan revealed incidental finding of enlarging right adnexal/ovarian soft tissue lesion and is not a simple cyst.  Will need referral to GYN.  She does not have a GYN MD. -nystatin powder for left groin.  Discussed with her to keep groin dry.  Leontine Locket, PA-C [facility] [phone] 05/13/2018 8:27 AM  Addendum:  I spoke with Dr. Nehemiah Settle, GYN on call this weekend.  I have ordered a CA 125 and a transvaginal u/s.  She will f/u with their office upon discharge.  She will call and make appointment with them per Dr. Nehemiah Settle.  Leontine Locket, [facility] 05/13/2018 8:54 AM  I have examined the patient, reviewed and agree with above.  Mild tingling and numbness in left distal foot.  Motor intact.  Right groin puncture without hematoma.  Mobilize.  Transition to Coumadin.  Curt Jews, MD 05/13/2018 11:18 AM

## 2018-05-13 NOTE — Progress Notes (Signed)
[place] for heparin Indication: ischemic leg  Allergies  Allergen Reactions  . Ciprofloxacin Hcl Hives  . Macrobid [Nitrofurantoin Macrocrystal] Hives, Shortness Of Breath and Rash  . Other Hives, Shortness Of Breath and Rash    NO "-CILLINS"!!!  . Penicillins Shortness Of Breath    Had had cephalosporins without incident  . Sulfa Antibiotics Hives, Shortness Of Breath and Rash  . Lexapro [Escitalopram Oxalate]     I just did not like it.    Patient Measurements: Height: 5\' 7"  (170.2 cm) Weight: 274 lb 0.5 oz (124.3 kg) IBW/kg (Calculated) : 61.6 Heparin Dosing Weight: 90kg  Vital Signs: Temp: 98 F (36.7 C) (08/09 2326) Temp Source: Oral (08/09 2326) BP: 147/86 (08/09 2326) Pulse Rate: 84 (08/09 2326)  Labs: Recent Labs    05/12/18 0613 05/12/18 1741 05/12/18 1949 05/13/18 0022  HGB 7.2* 6.1* 8.2* 7.7*  HCT 25.1* 18.0* 24.0* 25.3*  PLT 642*  --   --  421*  LABPROT 13.3  --   --   --   INR 1.01  --   --   --   HEPARINUNFRC  --   --   --  1.60*  CREATININE 1.58*  --   --  1.37*    Estimated Creatinine Clearance: 72.5 mL/min (A) (by C-G formula based on SCr of 1.37 mg/dL (H)).   Medical History: Past Medical History:  Diagnosis Date  . Anxiety   . Diabetes ([place])   . GERD (gastroesophageal reflux disease)   . History of blood clots   . Hypercalcemia   . Hyperparathyroidism   . Nephrolithiasis   . Renal calculi     Medications:  Infusions:  . sodium chloride    . sodium chloride 75 mL/hr at 05/12/18 2121  . heparin 2,150 Units/hr (05/12/18 2135)  . magnesium sulfate 1 - 4 g bolus IVPB    . vancomycin 1,000 mg (05/13/18 0226)    Assessment: 22 yof presented to the ED with leg pain. She was prescribed coumadin but stopped taking. Now on heparin for ischemic leg s/p s/p thrombectomy and angioplasty on 8/9.  -hg = 7.7 (up from 6.1), vaginal bleeding has been noted -heparin level= 1.6 (heparin boluses given in  OR)  Goal of Therapy:  Heparin level 0.3-0.7 units/ml Monitor platelets by anticoagulation protocol: Yes   Plan:   -Hold heparin for 1 hour and decrease to 1850 units/hr -Heparin level in 6 hours and daily wth CBC daily  Hildred Laser, PharmD Clinical Pharmacist Please check Amion for pharmacy contact number 05/13/2018,3:01 AM

## 2018-05-13 NOTE — Progress Notes (Signed)
Clinical Social Worker acknowledge consult placed to assist patient with medication. Consult is inappropriate as CSW is unable to assist patient with medication needs. CSW reached ou to [facility] on floor to make her aware of patients need. CSW signing off , please consult CSW again if social work needs arise.   Rhea Pink, MSW,  [place]

## 2018-05-13 NOTE — Evaluation (Signed)
Occupational Therapy Evaluation Patient Details Name: Terri Sparks MRN: [id number] DOB: [birth year] Today's Date: 05/13/2018    History of Present Illness Terri Sparks is a 42 y.o. female with history of diabetes, hypertension, venous and arterial peripheral clots, PE, presents to emergency department with complaint of pain to the left leg, shortness of breath, chest pain.  Patient states her main issue today is left lower leg pain.  Patient was hospitalized in May for acute ischemic left leg.  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  She underwent thromboembolectomy of the left iliac and popliteal arteries and endarterectomy with angioplasty of the left superficial femoral artery.  8/9 Mechanical thrombectomy using the Penumbra device of the left common iliac, left external iliac, left common femoral, left profundofemoral, left superficial femoral, left popliteal, left anterior tibial, and left peroneal artery   Clinical Impression   Pt admitted with LLE ischemia. Pt currently with functional limitations due to the deficits listed below (see OT Problem List).  Pt will benefit from skilled OT to increase their safety and independence with ADL and functional mobility for ADL to facilitate discharge to venue listed below.     Follow Up Recommendations  No OT follow up    Equipment Recommendations  None recommended by OT    Recommendations for Other Services       Precautions / Restrictions Precautions Precautions: Fall      Mobility Bed Mobility Overal bed mobility: Needs Assistance Bed Mobility: Supine to Sit     Supine to sit: Supervision        Transfers Overall transfer level: Needs assistance Equipment used: Rolling walker (2 wheeled) Transfers: Sit to/from [facility] Sit to Stand: Min assist Stand pivot transfers: Min assist                ADL either  performed or assessed with clinical judgement   ADL Overall ADL's : Needs assistance/impaired Eating/Feeding: Maximal assistance;Sitting   Grooming: Set up;Sitting   Upper Body Bathing: Set up;Sitting   Lower Body Bathing: Moderate assistance;Cueing for safety;Cueing for compensatory techniques   Upper Body Dressing : Set up;Sitting   Lower Body Dressing: Moderate assistance;Cueing for safety;Cueing for compensatory techniques   Toilet Transfer: Minimal assistance;RW;Stand-pivot;Cueing for safety   Toileting- Clothing Manipulation and Hygiene: Moderate assistance;Sit to/from stand;Cueing for compensatory techniques;Cueing for safety         General ADL Comments: pt plans to DC with her daugther but will have to do 24 stairs     Vision Patient Visual Report: No change from baseline       Perception     Praxis      Pertinent Vitals/Pain Pain Assessment: 0-10 Pain Score: 2  Pain Location: LLE Pain Descriptors / Indicators: Tender Pain Intervention(s): Monitored during session     Hand Dominance     Extremity/Trunk Assessment Upper Extremity Assessment Upper Extremity Assessment: Generalized weakness           Communication Communication Communication: No difficulties   Cognition Arousal/Alertness: Awake/alert Behavior During Therapy: WFL for tasks assessed/performed Overall Cognitive Status: Within Functional Limits for tasks assessed                                                Home Living Family/patient expects to be discharged to::  Private residence Living Arrangements: Children   Type of Home: Apartment Home Access: Stairs to enter Technical brewer of Steps: 24- landing in middle in which pt could rest         Bathroom Shower/Tub: Tub/shower unit         Home Equipment: [place] - single point          Prior Functioning/Environment Level of Independence: Independent                 OT Problem List:  Decreased strength;Decreased activity tolerance;Impaired balance (sitting and/or standing);Decreased knowledge of use of DME or AE      OT Treatment/Interventions: Self-care/ADL training;Patient/family education;DME and/or AE instruction;Energy conservation    OT Goals(Current goals can be found in the care plan section) Acute Rehab OT Goals Patient Stated Goal: home with daughter OT Goal Formulation: With patient Time For Goal Achievement: 05/27/18 Potential to Achieve Goals: Good ADL Goals Pt Will Perform Grooming: with modified independence;standing Pt Will Perform Upper Body Dressing: with modified independence;standing Pt Will Perform Lower Body Dressing: with modified independence;sit to/from stand Pt Will Transfer to Toilet: with modified independence;regular height toilet Pt Will Perform Toileting - Clothing Manipulation and hygiene: with modified independence;sit to/from stand  OT Frequency: Min 2X/week   Barriers to D/C:               AM-PAC PT "6 Clicks" Daily Activity     Outcome Measure Help from another person eating meals?: None Help from another person taking care of personal grooming?: A Little Help from another person toileting, which includes using toliet, bedpan, or urinal?: A Little Help from another person bathing (including washing, rinsing, drying)?: A Little Help from another person to put on and taking off regular upper body clothing?: A Little Help from another person to put on and taking off regular lower body clothing?: A Little 6 Click Score: 19   End of Session Equipment Utilized During Treatment: Rolling walker Nurse Communication: Mobility status  Activity Tolerance: Patient tolerated treatment well Patient left: in chair;with call bell/phone within reach  OT Visit Diagnosis: Unsteadiness on feet (R26.81);Muscle weakness (generalized) (M62.81);History of falling (Z91.81)                Time: 1115-1130 OT Time Calculation (min): 15  min Charges:  OT General Charges $OT Visit: 1 Visit OT Evaluation $OT Eval Moderate Complexity: [place], [place]  Betsy Pries 05/13/2018, 1:26 PM

## 2018-05-13 NOTE — Progress Notes (Signed)
PT Cancellation Note  Patient Details Name: Terri Sparks MRN: [id number] DOB: 1976/05/14   Cancelled Treatment:    Reason Eval/Treat Not Completed: Active bedrest order. Pt with conflicting activity orders. PT will continue to follow acutely and await clarification regarding activity orders.   [place] 05/13/2018, 7:44 AM

## 2018-05-13 NOTE — Progress Notes (Signed)
ANTICOAGULATION CONSULT NOTE - [place] for heparin Indication: ischemic leg, arterial thromboembolus  Allergies  Allergen Reactions  . Ciprofloxacin Hcl Hives  . Macrobid [Nitrofurantoin Macrocrystal] Hives, Shortness Of Breath and Rash  . Other Hives, Shortness Of Breath and Rash    NO "-CILLINS"!!!  . Penicillins Shortness Of Breath    Had had cephalosporins without incident  . Sulfa Antibiotics Hives, Shortness Of Breath and Rash  . Lexapro [Escitalopram Oxalate]     I just did not like it.    Patient Measurements: Height: 5\' 7"  (170.2 cm) Weight: 274 lb 0.5 oz (124.3 kg) IBW/kg (Calculated) : 61.6 Heparin Dosing Weight: 91.2 kg  Vital Signs: Temp: 98.7 F (37.1 C) (08/10 1950) Temp Source: Oral (08/10 1950) BP: 121/58 (08/10 1950) Pulse Rate: 101 (08/10 1950)  Labs: Recent Labs    05/12/18 0613 05/12/18 1741 05/12/18 1949 05/13/18 0022 05/13/18 0906 05/13/18 1757  HGB 7.2* 6.1* 8.2* 7.7*  --   --   HCT 25.1* 18.0* 24.0* 25.3*  --   --   PLT 642*  --   --  421*  --   --   LABPROT 13.3  --   --   --   --   --   INR 1.01  --   --   --   --   --   HEPARINUNFRC  --   --   --  1.60* 0.76* <0.10*  CREATININE 1.58*  --   --  1.37*  --   --     Estimated Creatinine Clearance: 72.5 mL/min (A) (by C-G formula based on SCr of 1.37 mg/dL (H)).   Medical History: Past Medical History:  Diagnosis Date  . Anxiety   . Diabetes ([place])   . GERD (gastroesophageal reflux disease)   . History of blood clots   . Hypercalcemia   . Hyperparathyroidism   . Nephrolithiasis   . Renal calculi     Medications:  Infusions:  . sodium chloride    . sodium chloride 10 mL/hr at 05/13/18 0433  . heparin 1,750 Units/hr (05/13/18 1718)  . magnesium sulfate 1 - 4 g bolus IVPB      Assessment: 42 yo F presented to the ED with leg pain. She was prescribed coumadin at 10 mg T/Th/Sat and 5 mg all other days, but stopped taking. Now on heparin and  restarting warfarin for ischemic leg and arterial thromboembolism s/p thrombectomy and angioplasty on 8/9.   Heparin level currently undetectable.  Spoke with RN and patient.  PT lost IV access from ~5pm-6pm explaining why 6pm heparin level was undetectable.  Pt now has new IV site and heparin infusing at 1750 units/hr.  Goal of Therapy:  INR 2-3 Heparin level 0.3-0.7 units/ml Monitor platelets by anticoagulation protocol: Yes   Plan:   -Continue heparin to 1,750 units/hr -Next heparin level with AM labs. -Daily INR and CBC  Mykal Batiz, Pharm.D., BCPS Clinical Pharmacist  05/13/2018 8:54 PM

## 2018-05-14 ENCOUNTER — Inpatient Hospital Stay (HOSPITAL_COMMUNITY): Payer: Medicaid Other

## 2018-05-14 DIAGNOSIS — D62 Acute posthemorrhagic anemia: Secondary | ICD-10-CM | POA: Diagnosis not present

## 2018-05-14 DIAGNOSIS — I745 Embolism and thrombosis of iliac artery: Secondary | ICD-10-CM

## 2018-05-14 DIAGNOSIS — N939 Abnormal uterine and vaginal bleeding, unspecified: Secondary | ICD-10-CM | POA: Diagnosis not present

## 2018-05-14 DIAGNOSIS — I998 Other disorder of circulatory system: Secondary | ICD-10-CM

## 2018-05-14 DIAGNOSIS — N83201 Unspecified ovarian cyst, right side: Secondary | ICD-10-CM | POA: Diagnosis present

## 2018-05-14 DIAGNOSIS — Z9889 Other specified postprocedural states: Secondary | ICD-10-CM

## 2018-05-14 LAB — PROTIME-INR
INR: 1.02
Prothrombin Time: 13.3 seconds (ref 11.4–15.2)

## 2018-05-14 LAB — CBC
HCT: 20.5 % — ABNORMAL LOW (ref 36.0–46.0)
HCT: 27.7 % — ABNORMAL LOW (ref 36.0–46.0)
Hemoglobin: 6 g/dL — CL (ref 12.0–15.0)
Hemoglobin: 8.6 g/dL — ABNORMAL LOW (ref 12.0–15.0)
MCH: 26.8 pg (ref 26.0–34.0)
MCH: 27.6 pg (ref 26.0–34.0)
MCHC: 29.3 g/dL — ABNORMAL LOW (ref 30.0–36.0)
MCHC: 31 g/dL (ref 30.0–36.0)
MCV: 91.5 fL (ref 78.0–100.0)
MCV: 88.8 fL (ref 78.0–100.0)
Platelets: 341 10*3/uL (ref 150–400)
Platelets: 381 10*3/uL (ref 150–400)
RBC: 2.24 MIL/uL — ABNORMAL LOW (ref 3.87–5.11)
RBC: 3.12 MIL/uL — ABNORMAL LOW (ref 3.87–5.11)
RDW: 18 % — ABNORMAL HIGH (ref 11.5–15.5)
RDW: 16.8 % — ABNORMAL HIGH (ref 11.5–15.5)
WBC: 9.4 10*3/uL (ref 4.0–10.5)
WBC: 10 10*3/uL (ref 4.0–10.5)

## 2018-05-14 LAB — HEMOGLOBIN AND HEMATOCRIT, BLOOD
HCT: 20.5 % — ABNORMAL LOW (ref 36.0–46.0)
Hemoglobin: 6 g/dL — CL (ref 12.0–15.0)

## 2018-05-14 LAB — GLUCOSE, CAPILLARY
Glucose-Capillary: 251 mg/dL — ABNORMAL HIGH (ref 70–99)
Glucose-Capillary: 177 mg/dL — ABNORMAL HIGH (ref 70–99)
Glucose-Capillary: 255 mg/dL — ABNORMAL HIGH (ref 70–99)

## 2018-05-14 LAB — HEPARIN LEVEL (UNFRACTIONATED): Heparin Unfractionated: 0.37 IU/mL (ref 0.30–0.70)

## 2018-05-14 LAB — PREPARE RBC (CROSSMATCH)

## 2018-05-14 LAB — CA 125: Cancer Antigen (CA) 125: 19.2 U/mL (ref 0.0–38.1)

## 2018-05-14 MED ORDER — LORAZEPAM 1 MG PO TABS
1.0000 mg | ORAL_TABLET | Freq: Four times a day (QID) | ORAL | Status: DC | PRN
  Administered 2018-05-14 (×2): 1 mg via ORAL
  Filled 2018-05-14 (×2): qty 1

## 2018-05-14 MED ORDER — PROMETHAZINE HCL 25 MG/ML IJ SOLN
12.5000 mg | Freq: Four times a day (QID) | INTRAMUSCULAR | Status: DC | PRN
  Administered 2018-05-14 – 2018-05-15 (×3): 12.5 mg via INTRAVENOUS
  Filled 2018-05-14 (×3): qty 1

## 2018-05-14 MED ORDER — WARFARIN SODIUM 2.5 MG PO TABS
12.5000 mg | ORAL_TABLET | Freq: Once | ORAL | Status: AC
  Administered 2018-05-14: 12.5 mg via ORAL
  Filled 2018-05-14: qty 1

## 2018-05-14 MED ORDER — SODIUM CHLORIDE 0.9% IV SOLUTION
Freq: Once | INTRAVENOUS | Status: AC
  Administered 2018-05-14: 12:00:00 via INTRAVENOUS

## 2018-05-14 MED ORDER — MEGESTROL ACETATE 40 MG PO TABS
80.0000 mg | ORAL_TABLET | Freq: Every day | ORAL | Status: DC
  Administered 2018-05-14: 80 mg via ORAL
  Filled 2018-05-14: qty 2

## 2018-05-14 MED ORDER — MEGESTROL ACETATE 40 MG PO TABS
80.0000 mg | ORAL_TABLET | Freq: Two times a day (BID) | ORAL | Status: DC
Start: 2018-05-14 — End: 2018-05-19
  Administered 2018-05-14 – 2018-05-19 (×10): 80 mg via ORAL
  Filled 2018-05-14 (×10): qty 2

## 2018-05-14 MED ORDER — FUROSEMIDE 10 MG/ML IJ SOLN
20.0000 mg | Freq: Once | INTRAMUSCULAR | Status: AC
  Administered 2018-05-14: 20 mg via INTRAVENOUS
  Filled 2018-05-14: qty 2

## 2018-05-14 NOTE — Progress Notes (Addendum)
Progress Note    05/14/2018 7:32 AM 2 Days Post-Op  Subjective:  Says she feels worse today; very nauseated again.  Says her foot is still tingly  Afebrile HR 80's-110's NSR/ST 233'K-122'E systolic 49% RA  Vitals:   05/14/18 0020 05/14/18 0400  BP: 120/64 (!) 144/68  Pulse: 95 87  Resp: 19 20  Temp: 97.6 F (36.4 C) 98.4 F (36.9 C)  SpO2: 96% 100%    Physical Exam: Cardiac:  regular Lungs:  Non labored Extremities:  +doppler signal left peroneal and PT   CBC    Component Value Date/Time   WBC 15.7 (H) 05/13/2018 0022   RBC 2.86 (L) 05/13/2018 0022   HGB 7.7 (L) 05/13/2018 0022   HGB 11.0 (L) 01/27/2018 1157   HCT 25.3 (L) 05/13/2018 0022   HCT 37.6 01/27/2018 1157   PLT 421 (H) 05/13/2018 0022   PLT 490 (H) 01/27/2018 1157   MCV 88.5 05/13/2018 0022   MCV 78 (L) 01/27/2018 1157   MCH 26.9 05/13/2018 0022   MCHC 30.4 05/13/2018 0022   RDW 17.1 (H) 05/13/2018 0022   RDW 17.1 (H) 01/27/2018 1157   LYMPHSABS 2.6 05/12/2018 0613   MONOABS 0.8 05/12/2018 0613   EOSABS 0.2 05/12/2018 0613   BASOSABS 0.1 05/12/2018 0613    BMET    Component Value Date/Time   NA 136 05/13/2018 0022   NA 134 01/27/2018 1157   K 4.7 05/13/2018 0022   CL 108 05/13/2018 0022   CO2 21 (L) 05/13/2018 0022   GLUCOSE 291 (H) 05/13/2018 0022   BUN 10 05/13/2018 0022   BUN 14 01/27/2018 1157   CREATININE 1.37 (H) 05/13/2018 0022   CALCIUM 8.5 (L) 05/13/2018 0022   GFRNONAA 46 (L) 05/13/2018 0022   GFRAA 54 (L) 05/13/2018 0022    INR    Component Value Date/Time   INR 1.01 05/12/2018 0613     Intake/Output Summary (Last 24 hours) at 05/14/2018 0732 Last data filed at 05/14/2018 0400 Gross per 24 hour  Intake 1266.49 ml  Output -  Net 1266.49 ml    Trans Vaginal u/s 05/13/18: IMPRESSION: 2.7 cm complex/hemorrhagic right ovarian cyst, less likely endometrioma. Consider follow-up pelvic ultrasound in 6-12 weeks.   Assessment:  42 y.o. female is s/p:  1.  Ultrasound-guided access, right femoral artery 2. Abdominal aortogram 3. Left leg runoff 4. Mechanical thrombectomy using thePenumbradevice of the left common iliac, left external iliac, left common femoral, left profundofemoral, left superficial femoral, left popliteal, left anterior tibial, and left peroneal artery 5. Balloon angioplasty, left anterior tibial artery 6. Intra-arterial administration of TPA and nitroglycerin 7. Closure device (Proglide)   2 Days Post-Op  Plan: -pt has good doppler signals left PT/peroneal -more nauseated today-says Zofran is not working-request phenergan. -encouraged pt to ambulate more today -DVT prophylaxis:  Heparin to coumadin bridge; INR 1.02 today.  Received coumadin 12.5mg  last evening.   -discussed with pt that her CA125 is not back yet and results of vaginal u/s and need to f/u with GYN upon discharge.  -WOC ordered new sheet of Interdry Ag+ for left groin  -hgb 6.0 this am (7.7 yesterday)-asked RN to redraw and if is still down, will transfuse 2 units PRBC's.  Given that adnexal cyst is hemorrhagic on u/s, will re-consult GYN for their input given pt needs to be on anticoagulation.   Leontine Locket, PA-C [facility] [phone] 05/14/2018 7:32 AM  Addendum:  Spoke with Dr. Nehemiah Settle:  Recommended transfusion (already ordered).  Asked me to order Megace 80mg  and he will see pt this afternoon.  Will ask RN to get plastic speculum, bed pan and towels and surgilube for bedside exam this afternoon.  Leontine Locket, [facility] 05/14/2018. 10:15 AM  I have examined the patient, reviewed and agree with above.  Up walking in her room without difficulty.  Receiving blood transfusion without complication.  Ankle arm index 0.75 on the left with no evidence of recurrent ischemia  Appreciate GYN assistance  Curt Jews, MD 05/14/2018 1:05 PM

## 2018-05-14 NOTE — Evaluation (Signed)
Physical Therapy Evaluation Patient Details Name: Terri Sparks MRN: [id number] DOB: 09-05-1976 Today's Date: 05/14/2018   History of Present Illness  Terri Sparks is a 42 y.o. female with history of diabetes, hypertension, venous and arterial peripheral clots, PE, presents to emergency department with complaint of pain to the left leg, shortness of breath, chest pain.  Patient states her main issue today is left lower leg pain.  Patient was hospitalized in May for acute ischemic left leg.  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  She underwent thromboembolectomy of the left iliac and popliteal arteries and endarterectomy with angioplasty of the left superficial femoral artery.  Mechanical thrombectomy using the Penumbra device of the left common iliac, left external iliac, left common femoral, left profundofemoral, left superficial femoral, left popliteal, left anterior tibial, and left peroneal artery    Clinical Impression  Pt admitted with above diagnosis. Pt currently with functional limitations due to the deficits listed below (see PT Problem List). PTA, pt independent with mobility. Today patient presents with fatigue, currently transfusing with last checked Hgb of 6.0. Pt reports some dizziness and LLE pain. Ambulating hallway with intermittent UE assist. Wishes to perform stairs tomorrow as she has many to enter home.   Pt will benefit from skilled PT to increase their independence and safety with mobility to allow discharge to the venue listed below.       Follow Up Recommendations Outpatient PT    Equipment Recommendations  (TBD)    Recommendations for Other Services       Precautions / Restrictions Precautions Precautions: Fall Restrictions Weight Bearing Restrictions: No      Mobility  Bed Mobility Overal bed mobility: Modified Independent                Transfers Overall  transfer level: Needs assistance Equipment used: None Transfers: Sit to/from Stand Sit to Stand: Supervision            Ambulation/Gait Ambulation/Gait assistance: Supervision Gait Distance (Feet): 100 Feet Assistive device: None;IV Pole Gait Pattern/deviations: Step-to pattern     General Gait Details: pt with mild unsteadiness, feeling weak today, holding onto IV pole.   Stairs            Wheelchair Mobility    Modified Rankin (Stroke Patients Only)       Balance Overall balance assessment: Mild deficits observed, not formally tested                                           Pertinent Vitals/Pain Pain Assessment: 0-10 Pain Score: 3  Pain Location: LLE Pain Descriptors / Indicators: Tender    Home Living Family/patient expects to be discharged to:: Private residence Living Arrangements: Children Available Help at Discharge: Family;Available 24 hours/day Type of Home: Apartment Home Access: Stairs to enter   Entrance Stairs-Number of Steps: 24- landing in middle in which pt could rest Home Layout: One level Home Equipment: Cane - single point Additional Comments: pt wishes to live with her daughter after d/c    Prior Function Level of Independence: Independent               Hand Dominance        Extremity/Trunk Assessment   Upper Extremity Assessment Upper Extremity Assessment: Defer to OT evaluation    Lower Extremity  Assessment Lower Extremity Assessment: (RLE 4/5, LLE 4-/5 gross)       Communication      Cognition Arousal/Alertness: Awake/alert Behavior During Therapy: WFL for tasks assessed/performed Overall Cognitive Status: Within Functional Limits for tasks assessed                                        General Comments      Exercises     Assessment/Plan    PT Assessment Patient needs continued PT services  PT Problem List Decreased strength;Decreased range of motion;Decreased  activity tolerance;Decreased balance       PT Treatment Interventions DME instruction;Gait training;Stair training;Functional mobility training;Therapeutic activities;Therapeutic exercise    PT Goals (Current goals can be found in the Care Plan section)  Acute Rehab PT Goals Patient Stated Goal: home with daugther PT Goal Formulation: With patient Time For Goal Achievement: 05/21/18 Potential to Achieve Goals: Good    Frequency Min 3X/week   Barriers to discharge        Co-evaluation               AM-PAC PT "6 Clicks" Daily Activity  Outcome Measure Difficulty turning over in bed (including adjusting bedclothes, sheets and blankets)?: None Difficulty moving from lying on back to sitting on the side of the bed? : None Difficulty sitting down on and standing up from a chair with arms (e.g., wheelchair, bedside commode, etc,.)?: None Help needed moving to and from a bed to chair (including a wheelchair)?: A Little Help needed walking in hospital room?: A Little Help needed climbing 3-5 steps with a railing? : A Little 6 Click Score: 21    End of Session Equipment Utilized During Treatment: Gait belt Activity Tolerance: Patient limited by fatigue Patient left: in bed Nurse Communication: Mobility status PT Visit Diagnosis: Unsteadiness on feet (R26.81)    Time: 9201-0071 PT Time Calculation (min) (ACUTE ONLY): 25 min   Charges:   PT Evaluation $PT Eval Low Complexity: 1 Low PT Treatments $Gait Training: 8-22 mins        Reinaldo Berber, PT, DPT Acute Rehab Services Pager: [phone]    Reinaldo Berber 05/14/2018, 4:44 PM

## 2018-05-14 NOTE — Consult Note (Signed)
[facility] Faculty Practice OB/GYN Attending Consult Note   Consult Date: 05/14/2018  Reason for Consult: Hemorrhagic Cyst, AUB Referring Physician: Dr Donnetta Hutching   Terri Sparks is an 42 y.o. female who was admitted for left iliac thrombosis status post thrombectomy and currently on heparin with bridge to Coumadin.  Patient has been noted to be significantly anemic with a right adnexal mass seen on CT yesterday.  On my request, a Ca 125 was ordered as well as a vaginal ultrasound.  Ultrasound shows a hemorrhagic cyst that is approximately 2.7 cm in diameter.  Additionally, the patient has been having significant abnormal uterine bleeding that started in June for approximately 10 days, then she stopped for 10 days, and has had continuous bleeding since July 11.  She passes clots the size of a peach.  She becomes lightheaded and dizzy after ambulating 20 feet.  Her bleeding has become worse.  No palliating or provoking factors.  She has never had episodes of this before.  She has been on anticoagulation since May after her surgery for left iliac artery thromboembolectomy.  Assessment/Plan: 1. Thrombosis left iliac artery 1. Management per vascular surgery: 2. Patient on Heparin drip with bridge to coumadin 2. Ischemia of left lower extremity 1. Management per vascular surgery 3. AUB 1. This is likely the source of the patient's blood loss. 2. Megace 20m started per my request. 3. Continue this upon discharge: Megace 445mBID  4. At this point, IV premarin and TXA not advisable. 4. Hemorrhagic Cyst Right Ovary 1. I personally reviewed the patient's ultrasound images 2. Cyst is small (2.7 cm in diameter, volume of approximately 15 mL) and has no impact on her blood loss as there is no free fluid in the pelvis. This shouldn't cause any further problems.  3. Surgery not indicated at this time. 4. Rpt USKorean 6-12 weeks 5. Acute blood loss anemia 1. Secondary to  menses 2. Patient being transfused 2 units PRBCs.  3. H/H pending completion of transfusion 6. T2DM 1. Management per primary team  Appreciate care of Terri Sparks her primary team  Please call [phone] call) for any gynecologic concerns at any time.  Thank you for involving usKorean the care of this patient.     Pertinent OB/GYN History: No LMP recorded. Patient is perimenopausal.  Patient Active Problem List   Diagnosis Date Noted  . Abnormal uterine bleeding (AUB) 05/14/2018  . Hemorrhagic cyst of right ovary 05/14/2018  . Ischemia of extremity 05/12/2018  . Former smoker 03/14/2018  . Thrombosis of left iliac artery (HCAjo05/25/2019  . Thromboembolism (HCSt. Charles05/25/2019  . Microalbuminuria 09/29/2017  . Insomnia 09/29/2017  . Immunization due 08/01/2017  . Anxiety and depression 04/28/2017  . 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 Iliac artery occlusion, left (HCAng[place]05/25/2018  . Tobacco abuse 02/25/2017  . Diabetes mellitus, type II (HCRemy05/25/2018  . Peripheral vascular disease of lower extremity (HCLake Crystal05/24/2018  . GERD (gastroesophageal reflux disease)   . Hyperparathyroidism     Past Medical History:  Diagnosis Date  . Anxiety   . Diabetes (HCKenai  . GERD (gastroesophageal reflux disease)   . History of blood clots   . Hypercalcemia   . Hyperparathyroidism   . Nephrolithiasis   . Renal calculi     Past Surgical History:  Procedure Laterality Date  . ABDOMINAL AORTOGRAM W/LOWER EXTREMITY N/A 11/14/2017  Procedure: ABDOMINAL AORTOGRAM W/LOWER EXTREMITY;  Surgeon: Waynetta Sandy, MD;  Location: [place] [facility];  Service: Cardiovascular;  Laterality: N/A;  . ANGIOPLASTY ILLIAC ARTERY Left 02/25/2018   Procedure: BALLOON ANGIOPLASTY LEFT ILIAC ARTERY;  Surgeon: Conrad Baden, MD;  Location: [place];  Service: Vascular;  Laterality: Left;  . APPLICATION OF WOUND VAC Left 03/03/2018   Procedure: APPLICATION OF  WOUND VAC;  Surgeon: Angelia Mould, MD;  Location: [place];  Service: Vascular;  Laterality: Left;  . EMBOLECTOMY Left 02/24/2017   Procedure: Left Lower Extremity Embolectomy and Angiogram.;  Surgeon: Waynetta Sandy, MD;  Location: [place];  Service: Vascular;  Laterality: Left;  . INSERTION OF ILIAC STENT  02/24/2017   Procedure: INSERTION OF Common ILIAC STENT;  Surgeon: Waynetta Sandy, MD;  Location: [place];  Service: Vascular;;  . INTRAOPERATIVE ARTERIOGRAM Left 02/25/2018   Procedure: INTRA OPERATIVE ARTERIOGRAM WITH LEFT LEG RUNOFF;  Surgeon: Conrad Rugby, MD;  Location: [place];  Service: Vascular;  Laterality: Left;  . LOWER EXTREMITY ANGIOGRAM Left 02/24/2017   Procedure: Aortagram, Left lower extremity Run-off;  Surgeon: Waynetta Sandy, MD;  Location: [place];  Service: Vascular;  Laterality: Left;  . PATCH ANGIOPLASTY Left 02/25/2018   Procedure: PATCH ANGIOPLASTY LEFT SUPERFICIAL FEMORAL ARTERY WITH BOVINE PATCH;  Surgeon: Conrad Freeport, MD;  Location: [place];  Service: Vascular;  Laterality: Left;  . 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 THROMBECTOMY FEMORAL ARTERY Left 02/25/2018   Procedure: LEFT ILIAC AND POPLITEAL ARTERY THROMBECTOMY;  Surgeon: Conrad Chums Corner, MD;  Location: [place];  Service: Vascular;  Laterality: Left;  . TUBAL LIGATION    . WOUND DEBRIDEMENT Left 03/03/2018   Procedure: DEBRIDEMENT WOUND;  Surgeon: Angelia Mould, MD;  Location: [place];  Service: Vascular;  Laterality: Left;  . WOUND EXPLORATION Left 03/03/2018   Procedure: WOUND EXPLORATION;  Surgeon: Angelia Mould, MD;  [facility];  Service: Vascular;  Laterality: Left;    Family History  Problem Relation Age of Onset  . Depression Mother   . Diabetes Father   . Heart failure Father     Social History:  reports that she has been smoking cigarettes. She has a 3.75 pack-year smoking history. She has never used smokeless tobacco. She reports that she has  current or past drug history. Drug: Marijuana. She reports that she does not drink alcohol.  Allergies:  Allergies  Allergen Reactions  . Ciprofloxacin Hcl Hives  . Macrobid [Nitrofurantoin Macrocrystal] Hives, Shortness Of Breath and Rash  . Other Hives, Shortness Of Breath and Rash    NO "-CILLINS"!!!  . Penicillins Shortness Of Breath    Had had cephalosporins without incident  . Sulfa Antibiotics Hives, Shortness Of Breath and Rash  . Lexapro [Escitalopram Oxalate]     I just did not like it.    Medications:  I have reviewed the patient's current medications. Prior to Admission:  Medications Prior to Admission  Medication Sig Dispense Refill Last Dose  . acetaminophen (TYLENOL) 500 MG tablet Take 2,000 mg by mouth every 6 (six) hours as needed.   05/10/2018 at prn  . aspirin EC 81 MG tablet Take 81 mg by mouth daily.   04/18/2018  . diphenhydrAMINE (BENADRYL) 25 MG tablet Take 25 mg by mouth every 6 (six) hours as needed.   04/18/2018  . ferrous sulfate 325 (65 FE) MG tablet Take 1 tablet (325 mg total) by mouth daily with breakfast. 100  tablet 1 04/18/2018  . gabapentin (NEURONTIN) 300 MG capsule TAKE 1 CAPSULE BY MOUTH 2 TIMES DAILY. (Patient taking differently: Take 300 mg by mouth 4 (four) times daily. ) 60 capsule 6 04/18/2018  . hydrOXYzine (ATARAX/VISTARIL) 25 MG tablet 1 tab PO Q a.m, 1 Q noon and 2 Q p.m PRN (Patient taking differently: Take 25 mg by mouth See admin instructions. 1 tab PO Q a.m, 1 Q noon and 2 Q p.m PRN) 120 tablet 2 04/18/2018 at prn  . insulin aspart (NOVOLOG) 100 UNIT/ML injection 5-15 units subcut TID with meals (Patient taking differently: Inject 5-25 Units into the skin See admin instructions. Sliding scale) 10 mL 11 04/18/2018  . insulin glargine (LANTUS) 100 UNIT/ML injection Inject 0.77 mLs (77 Units total) into the skin daily. (Patient taking differently: Inject 70 Units into the skin daily. ) 10 mL 11 04/18/2018  . lisinopril (PRINIVIL,ZESTRIL) 5 MG tablet  Take 0.5 tablets (2.5 mg total) by mouth daily. 45 tablet 3 04/18/2018  . omeprazole (PRILOSEC) 20 MG capsule Take 2 capsules (40 mg total) by mouth daily. (Patient taking differently: Take 20-40 mg by mouth daily. ) 60 capsule 3 04/18/2018  . ondansetron (ZOFRAN) 4 MG tablet Take 1 tablet (4 mg total) by mouth every 8 (eight) hours as needed for nausea or vomiting. 20 tablet 0 05/10/2018 at prn  . traMADol (ULTRAM) 50 MG tablet Take 1 tablet (50 mg total) by mouth every 6 (six) hours as needed. 30 tablet 0 04/18/2018 at prn  . warfarin (COUMADIN) 5 MG tablet Take 2 tablets on Monday, Wednesday and Friday with 1 tablet all other days. (Patient taking differently: Take 5 mg by mouth See admin instructions. 5 mg Sun, Mon , wed,  Fri 10 mg true, Thurs sat) 60 tablet 3 04/18/2018  . blood glucose meter kit and supplies Dispense based on patient and insurance preference. Use up to four times daily as directed. (FOR ICD-9 250.00, 250.01). 1 each 0 Taking  . Blood Glucose Monitoring Suppl (TRUE METRIX METER) w/Device KIT Use as directed 1 kit 0 Taking  . buPROPion (BUPROBAN) 150 MG 12 hr tablet Take 1 tablet (150 mg total) by mouth 2 (two) times daily. 60 tablet 2 04/18/2018  . clopidogrel (PLAVIX) 75 MG tablet Take 1 tablet (75 mg total) by mouth daily. 30 tablet 10 04/07/2018  . glucose blood (TRUE METRIX BLOOD GLUCOSE TEST) test strip Use as instructed 100 each 12 Taking  . glucose blood test strip Use as instructed 100 each 0 Taking  . Insulin Pen Needle 31G X 5 MM MISC Use with Lantus and Novolog injections. 100 each 11 Taking  . Insulin Syringe-Needle U-100 (BD INSULIN SYRINGE ULTRAFINE) 31G X 5/16" 0.5 ML MISC Use as directed 100 each 3 Taking  . liraglutide (VICTOZA) 18 MG/3ML SOPN Inject 0.1 mLs (0.6 mg total) into the skin daily. 3 pen 3 Taking  . nicotine (NICODERM CQ - DOSED IN MG/24 HOURS) 14 mg/24hr patch Place 1 patch (14 mg total) onto the skin daily. (Patient not taking: Reported on 05/12/2018) 28  patch 1 Not Taking at Unknown time  . nystatin (MYCOSTATIN) 100000 UNIT/ML suspension Take 5 mLs (500,000 Units total) by mouth 4 (four) times daily. (Patient not taking: Reported on 05/12/2018) 60 mL 0 Not Taking at Unknown time  . TRUEPLUS LANCETS 28G MISC Use as directed 100 each 6 Taking   Scheduled: . sodium chloride   Intravenous Once  . sodium chloride   Intravenous Once  .  aspirin EC  81 mg Oral Daily  . buPROPion  150 mg Oral BID  . docusate sodium  100 mg Oral Daily  . ferrous sulfate  325 mg Oral Q breakfast  . gabapentin  300 mg Oral BID  . insulin aspart  0-15 Units Subcutaneous TID WC  . insulin glargine  35 Units Subcutaneous Daily  . megestrol  80 mg Oral Daily  . nicotine  7 mg Transdermal Daily  . nystatin   Topical TID  . pantoprazole  40 mg Oral Daily  . warfarin  12.5 mg Oral ONCE-1800  . Warfarin - Pharmacist Dosing Inpatient   Does not apply q1800   Continuous: . sodium chloride    . sodium chloride 10 mL/hr at 05/13/18 0433  . heparin Stopped (05/14/18 1205)  . magnesium sulfate 1 - 4 g bolus IVPB     FAO:ZHYQMV chloride, acetaminophen **OR** acetaminophen, alum & mag hydroxide-simeth, bisacodyl, diphenhydrAMINE, guaiFENesin-dextromethorphan, hydrALAZINE, hydrOXYzine, hydrOXYzine, labetalol, LORazepam, magnesium sulfate 1 - 4 g bolus IVPB, metoprolol tartrate, morphine injection, ondansetron, oxyCODONE-acetaminophen, phenol, polyethylene glycol, potassium chloride, promethazine  Review of Systems: Pertinent items are noted in HPI.  Focused Physical Examination BP 139/70   Pulse 98   Temp 97.6 F (36.4 C) (Oral)   Resp 18   Ht 5' 7"  (1.702 m)   Wt 124.3 kg   SpO2 99%   BMI 42.92 kg/m  GENERAL: Well-developed, well-nourished female in no acute distress.  LUNGS: CTA, no respiratory distress. CARDIAC: RR, NL S1/S2. No murmur ABDOMEN: Soft, nontender, nondistended. No organomegaly. PELVIC: Normal external female genitalia. Vagina is pink and rugated.   Normal discharge. Normal cervix contour. Uterus is approximately 14 weeks in size. Right Adnexal fullness and tenderness. EXTREMITIES: No cyanosis, clubbing, or edema, 2+ distal pulses.  Results for orders placed or performed during the hospital encounter of 05/12/18 (from the past 72 hour(s))  CBC with Differential     Status: Abnormal   Collection Time: 05/12/18  6:13 AM  Result Value Ref Range   WBC 17.5 (H) 4.0 - 10.5 K/uL   RBC 2.81 (L) 3.87 - 5.11 MIL/uL   Hemoglobin 7.2 (L) 12.0 - 15.0 g/dL   HCT 25.1 (L) 36.0 - 46.0 %   MCV 89.3 78.0 - 100.0 fL   MCH 25.6 (L) 26.0 - 34.0 pg   MCHC 28.7 (L) 30.0 - 36.0 g/dL   RDW 18.4 (H) 11.5 - 15.5 %   Platelets 642 (H) 150 - 400 K/uL   Neutrophils Relative % 77 %   Neutro Abs 13.6 (H) 1.7 - 7.7 K/uL   Lymphocytes Relative 15 %   Lymphs Abs 2.6 0.7 - 4.0 K/uL   Monocytes Relative 5 %   Monocytes Absolute 0.8 0.1 - 1.0 K/uL   Eosinophils Relative 1 %   Eosinophils Absolute 0.2 0.0 - 0.7 K/uL   Basophils Relative 1 %   Basophils Absolute 0.1 0.0 - 0.1 K/uL   Immature Granulocytes 1 %   Abs Immature Granulocytes 0.1 0.0 - 0.1 K/uL    Comment: Performed at [facility], [address] [place], [place] [address]  Protime-INR     Status: None   Collection Time: 05/12/18  6:13 AM  Result Value Ref Range   Prothrombin Time 13.3 11.4 - 15.2 seconds   INR 1.01     Comment: Performed at [facility] [address], [place], [place] [address]  Comprehensive metabolic panel     Status: Abnormal  Collection Time: 05/12/18  6:13 AM  Result Value Ref Range   Sodium 135 135 - 145 mmol/L   Potassium 4.1 3.5 - 5.1 mmol/L   Chloride 102 98 - 111 mmol/L   CO2 16 (L) 22 - 32 mmol/L   Glucose, Bld 319 (H) 70 - 99 mg/dL   BUN 14 6 - 20 mg/dL   Creatinine, Ser 1.58 (H) 0.44 - 1.00 mg/dL   Calcium 9.5 8.9 - 10.3 mg/dL   Total Protein 6.8 6.5 - 8.1 g/dL   Albumin 3.4 (L) 3.5 - 5.0 g/dL   AST 14 (L) 15 - 41 U/L   ALT 11 0 - 44 U/L    Alkaline Phosphatase 85 38 - 126 U/L   Total Bilirubin 0.5 0.3 - 1.2 mg/dL   GFR calc non Af Amer 39 (L) >60 mL/min   GFR calc Af Amer 45 (L) >60 mL/min    Comment: (NOTE) The eGFR has been calculated using the CKD EPI equation. This calculation has not been validated in all clinical situations. eGFR's persistently <60 mL/min signify possible Chronic Kidney Disease.    Anion gap 17 (H) 5 - 15    Comment: Performed at [facility], [place] [address], [place], [place] [address]  I-Stat CG4 Lactic Acid, ED     Status: Abnormal   Collection Time: 05/12/18  6:38 AM  Result Value Ref Range   Lactic Acid, Venous 3.21 (HH) 0.5 - 1.9 mmol/L   Comment NOTIFIED PHYSICIAN   I-Stat Beta hCG blood, ED ([facility], [facility], [facility] only)     Status: None   Collection Time: 05/12/18  6:40 AM  Result Value Ref Range   I-stat hCG, quantitative <5.0 <5 mIU/mL   Comment 3            Comment:   GEST. AGE      CONC.  (mIU/mL)   <=1 WEEK        5 - 50     2 WEEKS       50 - 500     3 WEEKS       100 - 10,000     4 WEEKS     1,000 - 30,000        FEMALE AND NON-PREGNANT FEMALE:     LESS THAN 5 mIU/mL   I-Stat CG4 Lactic Acid, ED     Status: None   Collection Time: 05/12/18  9:19 AM  Result Value Ref Range   Lactic Acid, Venous 1.27 0.5 - 1.9 mmol/L  Glucose, capillary     Status: Abnormal   Collection Time: 05/12/18  1:55 PM  Result Value Ref Range   Glucose-Capillary 200 (H) 70 - 99 mg/dL  Surgical pcr screen     Status: None   Collection Time: 05/12/18  2:12 PM  Result Value Ref Range   MRSA, PCR NEGATIVE NEGATIVE   Staphylococcus aureus NEGATIVE NEGATIVE    Comment: (NOTE) The Xpert SA Assay (FDA approved for NASAL specimens in patients 75 years of age and older), is one component of a comprehensive surveillance program. It is not intended to diagnose infection nor to guide or monitor treatment. Performed at [facility], [place] [address], [place], [place] [address]   Glucose, capillary     Status:  Abnormal   Collection Time: 05/12/18  4:21 PM  Result Value Ref Range   Glucose-Capillary 227 (H) 70 - 99 mg/dL  POCT Activated clotting time     Status:  None   Collection Time: 05/12/18  4:21 PM  Result Value Ref Range   Activated Clotting Time 136 seconds  I-STAT 4, (NA,K, GLUC, HGB,HCT)     Status: Abnormal   Collection Time: 05/12/18  5:41 PM  Result Value Ref Range   Sodium 130 (L) 135 - 145 mmol/L   Potassium 3.8 3.5 - 5.1 mmol/L   Glucose, Bld 124 (H) 70 - 99 mg/dL   HCT 18.0 (L) 36.0 - 46.0 %   Hemoglobin 6.1 (LL) 12.0 - 15.0 g/dL  POCT Activated clotting time     Status: None   Collection Time: 05/12/18  5:45 PM  Result Value Ref Range   Activated Clotting Time 257 seconds  Type and screen [place]     Status: None (Preliminary result)   Collection Time: 05/12/18  5:50 PM  Result Value Ref Range   ABO/RH(D) O NEG    Antibody Screen NEG    Sample Expiration 05/15/2018    Unit Number [id number]    Blood Component Type RED CELLS,LR    Unit division 00    Status of Unit ISSUED,FINAL    Transfusion Status OK TO TRANSFUSE    Crossmatch Result Compatible    Unit Number [id number]    Blood Component Type RED CELLS,LR    Unit division 00    Status of Unit ISSUED,FINAL    Transfusion Status OK TO TRANSFUSE    Crossmatch Result Compatible    Unit Number [id number]    Blood Component Type RED CELLS,LR    Unit division 00    Status of Unit ISSUED    Transfusion Status OK TO TRANSFUSE    Crossmatch Result      Compatible Performed at [facility], [place] [address] [place], [place] [address]    Unit Number [id number]    Blood Component Type RED CELLS,LR    Unit division 00    Status of Unit ALLOCATED    Transfusion Status OK TO TRANSFUSE    Crossmatch Result Compatible   Prepare RBC     Status: None   Collection Time: 05/12/18  5:58 PM  Result Value Ref Range   Order Confirmation      ORDER PROCESSED BY BLOOD BANK Performed at  [facility], [place] [address], [place], [place] [address]   POCT Activated clotting time     Status: None   Collection Time: 05/12/18  7:07 PM  Result Value Ref Range   Activated Clotting Time 158 seconds  I-STAT 4, (NA,K, GLUC, HGB,HCT)     Status: Abnormal   Collection Time: 05/12/18  7:49 PM  Result Value Ref Range   Sodium 136 135 - 145 mmol/L   Potassium 5.7 (H) 3.5 - 5.1 mmol/L   Glucose, Bld 264 (H) 70 - 99 mg/dL   HCT 24.0 (L) 36.0 - 46.0 %   Hemoglobin 8.2 (L) 12.0 - 15.0 g/dL  Glucose, capillary     Status: Abnormal   Collection Time: 05/12/18  8:55 PM  Result Value Ref Range   Glucose-Capillary 230 (H) 70 - 99 mg/dL  Glucose, capillary     Status: Abnormal   Collection Time: 05/12/18 11:32 PM  Result Value Ref Range   Glucose-Capillary 274 (H) 70 - 99 mg/dL  CBC     Status: Abnormal   Collection Time: 05/13/18 12:22 AM  Result Value Ref Range   WBC 15.7 (H) 4.0 - 10.5 K/uL   RBC 2.86 (L) 3.87 -  5.11 MIL/uL   Hemoglobin 7.7 (L) 12.0 - 15.0 g/dL   HCT 25.3 (L) 36.0 - 46.0 %   MCV 88.5 78.0 - 100.0 fL   MCH 26.9 26.0 - 34.0 pg   MCHC 30.4 30.0 - 36.0 g/dL   RDW 17.1 (H) 11.5 - 15.5 %   Platelets 421 (H) 150 - 400 K/uL    Comment: Performed at [facility] [address] [place], [place] [address]  Basic metabolic panel     Status: Abnormal   Collection Time: 05/13/18 12:22 AM  Result Value Ref Range   Sodium 136 135 - 145 mmol/L   Potassium 4.7 3.5 - 5.1 mmol/L    Comment: NO VISIBLE HEMOLYSIS   Chloride 108 98 - 111 mmol/L   CO2 21 (L) 22 - 32 mmol/L   Glucose, Bld 291 (H) 70 - 99 mg/dL   BUN 10 6 - 20 mg/dL   Creatinine, Ser 1.37 (H) 0.44 - 1.00 mg/dL   Calcium 8.5 (L) 8.9 - 10.3 mg/dL   GFR calc non Af Amer 46 (L) >60 mL/min   GFR calc Af Amer 54 (L) >60 mL/min    Comment: (NOTE) The eGFR has been calculated using the CKD EPI equation. This calculation has not been validated in all clinical situations. eGFR's persistently <60 mL/min signify  possible Chronic Kidney Disease.    Anion gap 7 5 - 15    Comment: Performed at [facility] [address], [place], [place] [address]  Heparin level (unfractionated)     Status: Abnormal   Collection Time: 05/13/18 12:22 AM  Result Value Ref Range   Heparin Unfractionated 1.60 (H) 0.30 - 0.70 IU/mL    Comment: RESULTS CONFIRMED BY MANUAL DILUTION Performed at [facility], [place] [address] [place], [place] [address]   Glucose, capillary     Status: Abnormal   Collection Time: 05/13/18  6:43 AM  Result Value Ref Range   Glucose-Capillary 334 (H) 70 - 99 mg/dL  Heparin level (unfractionated)     Status: Abnormal   Collection Time: 05/13/18  9:06 AM  Result Value Ref Range   Heparin Unfractionated 0.76 (H) 0.30 - 0.70 IU/mL    Comment: (NOTE) If heparin results are below expected values, and patient dosage has  been confirmed, suggest follow up testing of antithrombin III levels. Performed at [facility], [place] [address], [place], [place] [address]   Glucose, capillary     Status: Abnormal   Collection Time: 05/13/18 11:15 AM  Result Value Ref Range   Glucose-Capillary 257 (H) 70 - 99 mg/dL   Comment 1 Notify RN    Comment 2 Document in Chart   Glucose, capillary     Status: Abnormal   Collection Time: 05/13/18  4:11 PM  Result Value Ref Range   Glucose-Capillary 284 (H) 70 - 99 mg/dL   Comment 1 Notify RN    Comment 2 Document in Chart   Heparin level (unfractionated)     Status: Abnormal   Collection Time: 05/13/18  5:57 PM  Result Value Ref Range   Heparin Unfractionated <0.10 (L) 0.30 - 0.70 IU/mL    Comment: (NOTE) If heparin results are below expected values, and patient dosage has  been confirmed, suggest follow up testing of antithrombin III levels. Performed at [facility], [place] [address], [place], [place] [address]   Glucose, capillary     Status: Abnormal   Collection Time: 05/13/18  9:16 PM  Result Value Ref Range    Glucose-Capillary 283 (H) 70 - 99 mg/dL  Glucose, capillary     Status: Abnormal   Collection Time: 05/14/18  6:40 AM  Result Value Ref Range   Glucose-Capillary 251 (H) 70 - 99 mg/dL  CBC     Status: Abnormal   Collection Time: 05/14/18  6:46 AM  Result Value Ref Range   WBC 9.4 4.0 - 10.5 K/uL   RBC 2.24 (L) 3.87 - 5.11 MIL/uL   Hemoglobin 6.0 (LL) 12.0 - 15.0 g/dL    Comment: REPEATED TO VERIFY CRITICAL RESULT CALLED TO, READ BACK BY AND VERIFIED WITH: B PERKINS,RN 0840 05/14/18 D BRADLEY    HCT 20.5 (L) 36.0 - 46.0 %   MCV 91.5 78.0 - 100.0 fL   MCH 26.8 26.0 - 34.0 pg   MCHC 29.3 (L) 30.0 - 36.0 g/dL   RDW 18.0 (H) 11.5 - 15.5 %   Platelets 341 150 - 400 K/uL    Comment: Performed at [facility], [place] [address] [place], [place] [address]  Heparin level (unfractionated)     Status: None   Collection Time: 05/14/18  6:46 AM  Result Value Ref Range   Heparin Unfractionated 0.37 0.30 - 0.70 IU/mL    Comment: (NOTE) If heparin results are below expected values, and patient dosage has  been confirmed, suggest follow up testing of antithrombin III levels. Performed at [facility], [place] [address] [place], [place] [address]   Protime-INR     Status: None   Collection Time: 05/14/18  6:46 AM  Result Value Ref Range   Prothrombin Time 13.3 11.4 - 15.2 seconds   INR 1.02     Comment: Performed at [facility] [address] [place], [place] [address]  Hemoglobin and hematocrit, blood     Status: Abnormal   Collection Time: 05/14/18  9:26 AM  Result Value Ref Range   Hemoglobin 6.0 (LL) 12.0 - 15.0 g/dL    Comment: REPEATED TO VERIFY CRITICAL VALUE NOTED.  VALUE IS CONSISTENT WITH PREVIOUSLY REPORTED AND CALLED VALUE.    HCT 20.5 (L) 36.0 - 46.0 %    Comment: Performed at [facility], [place] [address] [place], [place] [address]  Prepare RBC     Status: None   Collection Time: 05/14/18 10:05 AM  Result Value Ref Range   Order Confirmation       ORDER PROCESSED BY BLOOD BANK Performed at [facility], [place] [address] [place], [place] [address]   Glucose, capillary     Status: Abnormal   Collection Time: 05/14/18 11:37 AM  Result Value Ref Range   Glucose-Capillary 177 (H) 70 - 99 mg/dL     US Pelvic Complete With Transvaginal  Result Date: 05/13/2018 CLINICAL DATA:  Right ovarian mass EXAM: TRANSABDOMINAL AND TRANSVAGINAL ULTRASOUND OF PELVIS TECHNIQUE: Both transabdominal and transvaginal ultrasound examinations of the pelvis were performed. Transabdominal technique was performed for global imaging of the pelvis including uterus, ovaries, adnexal regions, and pelvic cul-de-sac. It was necessary to proceed with endovaginal exam following the transabdominal exam to visualize the endometrium and bilateral ovaries. COMPARISON:  CTA abdomen/pelvis dated 05/12/2018 FINDINGS: Uterus Measurements: 8.7 x 4.7 x 5.6 cm. No fibroids or other mass visualized. Endometrium Thickness: 7 mm.  Trace fluid in the uterine fundus. Right ovary Measurements: 4.9 x 2.8 x 2.8 cm. Dominant 2.7 x 2.2 x 2.7 cm complex/hemorrhagic cyst, less likely endometrioma.  Additional 1.9 x 1.4 x 2.0 cm simple cyst/follicle, physiologic. Left ovary Measurements: 2.7 x 1.7 x 2.7 cm. Normal appearance/no adnexal mass. Other findings No abnormal free fluid. IMPRESSION: 2.7 cm complex/hemorrhagic right ovarian cyst, less likely endometrioma. Consider follow-up pelvic ultrasound in 6-12 weeks. Electronically Signed   By: Julian Hy M.D.   On: 05/13/2018 16:05     Truett Mainland, DO 05/14/2018, 1:21 PM Attending [facility]ician [facility], [facility]

## 2018-05-14 NOTE — Progress Notes (Signed)
CRITICAL VALUE ALERT  Critical Value:  HGB 6.0  Date & Time Notied:  8:40 Am 8/11  Provider Notified: Rhyne  Orders Received/Actions taken: Page, awaiting orders

## 2018-05-14 NOTE — Progress Notes (Addendum)
ANTICOAGULATION CONSULT NOTE - [place] for heparin and warfarin Indication: ischemic leg, arterial thromboembolus  Allergies  Allergen Reactions  . Ciprofloxacin Hcl Hives  . Macrobid [Nitrofurantoin Macrocrystal] Hives, Shortness Of Breath and Rash  . Other Hives, Shortness Of Breath and Rash    NO "-CILLINS"!!!  . Penicillins Shortness Of Breath    Had had cephalosporins without incident  . Sulfa Antibiotics Hives, Shortness Of Breath and Rash  . Lexapro [Escitalopram Oxalate]     I just did not like it.    Patient Measurements: Height: 5\' 7"  (170.2 cm) Weight: 274 lb 0.5 oz (124.3 kg) IBW/kg (Calculated) : 61.6 Heparin Dosing Weight: 91.2 kg  Vital Signs: Temp: 97.3 F (36.3 C) (08/11 0817) Temp Source: Axillary (08/11 0817) BP: 108/46 (08/11 0817) Pulse Rate: 94 (08/11 0817)  Labs: Recent Labs    05/12/18 1751  05/13/18 0022 05/13/18 0906 05/13/18 1757 05/14/18 0646 05/14/18 0926  HGB 7.2*   < > 7.7*  --   --  6.0* 6.0*  HCT 25.1*   < > 25.3*  --   --  20.5* 20.5*  PLT 642*  --  421*  --   --  341  --   LABPROT 13.3  --   --   --   --  13.3  --   INR 1.01  --   --   --   --  1.02  --   HEPARINUNFRC  --    < > 1.60* 0.76* <0.10* 0.37  --   CREATININE 1.58*  --  1.37*  --   --   --   --    < > = values in this interval not displayed.    Estimated Creatinine Clearance: 72.5 mL/min (A) (by C-G formula based on SCr of 1.37 mg/dL (H)).   Medical History: Past Medical History:  Diagnosis Date  . Anxiety   . Diabetes ([place])   . GERD (gastroesophageal reflux disease)   . History of blood clots   . Hypercalcemia   . Hyperparathyroidism   . Nephrolithiasis   . Renal calculi     Medications:  Infusions:  . sodium chloride    . sodium chloride 10 mL/hr at 05/13/18 0433  . heparin 1,750 Units/hr (05/14/18 0800)  . magnesium sulfate 1 - 4 g bolus IVPB      Assessment: 42 yo F presented to the ED with leg pain. She was  prescribed coumadin at 10 mg T/Th/Sat and 5 mg all other days, but stopped taking. Now on heparin and warfarin for ischemic leg and arterial thromboembolism s/p thrombectomy and angioplasty on 8/9.   Hemoglobin at 6.0 this morning likely due to vaginal bleeding. Transfusion order placed to receive 2 units PRBC and MD added Megace. Will continue to monitor CBC and plans for anticoagulation.  -INR = 1.02  -Heparin level = 0.37  Goal of Therapy:  INR 2-3 Heparin level 0.3-0.7 units/ml Monitor platelets by anticoagulation protocol: Yes   Plan:   -Continue heparin at 1,750 units/hr -Warfarin 12.5 mg x 1 -Next heparin level with AM labs. -Daily INR and CBC  Vertis Kelch, PharmD PGY1 Pharmacy Resident Phone [phone] 05/14/2018       10:53 AM

## 2018-05-14 NOTE — Progress Notes (Addendum)
VASCULAR LAB PRELIMINARY  ARTERIAL  ABI completed:    RIGHT    LEFT    PRESSURE WAVEFORM  PRESSURE WAVEFORM  BRACHIAL 135 Triphasic BRACHIAL 125 Triphasic  DP 160 Triphasic DP    AT   AT 101 Severely dampened monophasic barely audible possible collateral  PT 162 Triphasic PT 96 Severely dampened monophasic barely audible  PER   PER 99 Dampened monophasic  GREAT TOE 128  NA GREAT TOE 0 NA    RIGHT LEFT  ABI / TBI 1.2 / 0.95 0.75 / 0   Right ABI indicate normal arterial flow at rest. TBI is normal. Left ABI indicate a moderate reduction in arterial flow at rest. TBI could not be calculated due to absent great toe PPG waveform.  Terri Sparks, RVS 05/14/2018, 10:51 AM

## 2018-05-14 NOTE — Consult Note (Signed)
[place] Nurse wound consult note Reason for Consult:Left leg arterial thromboembolus, nonhealing  S/p left thrombectomy and stenting left common ilial artery.  Nonhealing surgical wound left groin New surgical wound to right groin.  Surgical glue in place.  Wound type:nonhelaing surgical Pressure Injury POA: NA Measurement:LEft groin:  0.3 cm x 3 cm x 0.2 cm  Wound BVQ:XIHW pink friable and bleeds with cleansing.  Large crusting of powder and patient has been placing body powder multiple times daily to this area.   Drainage (amount, consistency, odor) Bleeds with cleansing  Serosanguinous drainage otherwise, no odor Periwound:intact Dressing procedure/placement/frequency:interdry AG to bilateral groin wounds to wick moisture and provide antimicrobial/antifungal benefit Measure and cut length of InterDry Ag+ to fit in skin folds that have skin breakdown Tuck InterDry  Ag+ fabric into skin folds in a single layer, allow for 2 inches of overhang from skin edges to allow for wicking to occur May remove to bathe; dry area thoroughly and then tuck into affected areas again Do not apply any creams or ointments when using InterDry Ag+ DO NOT THROW AWAY FOR 5 DAYS unless soiled with stool DO NOT [facility] product, this will inactivate the silver in the material  New sheet of Interdry Ag+ should be applied after 5 days of use if patient continues to have skin breakdown   Will not follow at this time.  Please re-consult if needed.  Domenic Moras RN BSN [place] Pager [phone]

## 2018-05-15 ENCOUNTER — Inpatient Hospital Stay (HOSPITAL_COMMUNITY): Payer: Medicaid Other

## 2018-05-15 DIAGNOSIS — I361 Nonrheumatic tricuspid (valve) insufficiency: Secondary | ICD-10-CM | POA: Diagnosis not present

## 2018-05-15 LAB — BPAM RBC
Blood Product Expiration Date: 201908172359
Blood Product Expiration Date: 201909022359
Blood Product Expiration Date: 201909062359
Blood Product Expiration Date: 201909062359
ISSUE DATE / TIME: 201908091834
ISSUE DATE / TIME: 201908091834
ISSUE DATE / TIME: 201908111122
ISSUE DATE / TIME: 201908111512
Unit Type and Rh: 9500
Unit Type and Rh: 9500
Unit Type and Rh: 9500
Unit Type and Rh: 9500

## 2018-05-15 LAB — HEPARIN LEVEL (UNFRACTIONATED)
Heparin Unfractionated: 0.19 IU/mL — ABNORMAL LOW (ref 0.30–0.70)
Heparin Unfractionated: 0.2 IU/mL — ABNORMAL LOW (ref 0.30–0.70)
Heparin Unfractionated: 0.29 IU/mL — ABNORMAL LOW (ref 0.30–0.70)

## 2018-05-15 LAB — CBC
HCT: 28 % — ABNORMAL LOW (ref 36.0–46.0)
Hemoglobin: 8.4 g/dL — ABNORMAL LOW (ref 12.0–15.0)
MCH: 26.9 pg (ref 26.0–34.0)
MCHC: 30 g/dL (ref 30.0–36.0)
MCV: 89.7 fL (ref 78.0–100.0)
Platelets: 391 10*3/uL (ref 150–400)
RBC: 3.12 MIL/uL — ABNORMAL LOW (ref 3.87–5.11)
RDW: 16.9 % — ABNORMAL HIGH (ref 11.5–15.5)
WBC: 11.3 10*3/uL — ABNORMAL HIGH (ref 4.0–10.5)

## 2018-05-15 LAB — GLUCOSE, CAPILLARY
Glucose-Capillary: 166 mg/dL — ABNORMAL HIGH (ref 70–99)
Glucose-Capillary: 243 mg/dL — ABNORMAL HIGH (ref 70–99)
Glucose-Capillary: 208 mg/dL — ABNORMAL HIGH (ref 70–99)

## 2018-05-15 LAB — TYPE AND SCREEN
ABO/RH(D): O NEG
Antibody Screen: NEGATIVE
Unit division: 0
Unit division: 0
Unit division: 0
Unit division: 0

## 2018-05-15 LAB — ECHOCARDIOGRAM COMPLETE
Height: 67 in
Weight: 4384.51 oz

## 2018-05-15 LAB — PROTIME-INR
INR: 0.99
Prothrombin Time: 13 seconds (ref 11.4–15.2)

## 2018-05-15 MED ORDER — PROMETHAZINE HCL 25 MG/ML IJ SOLN: 6.2500 mg | Freq: Three times a day (TID) | INTRAMUSCULAR | Status: DC | PRN

## 2018-05-15 MED ORDER — WARFARIN SODIUM 7.5 MG PO TABS
15.0000 mg | ORAL_TABLET | Freq: Once | ORAL | Status: AC
  Administered 2018-05-15: 15 mg via ORAL
  Filled 2018-05-15: qty 2

## 2018-05-15 MED ORDER — LORAZEPAM 1 MG PO TABS
1.0000 mg | ORAL_TABLET | Freq: Four times a day (QID) | ORAL | Status: DC | PRN
  Administered 2018-05-16 – 2018-05-19 (×5): 1 mg via ORAL
  Filled 2018-05-15 (×6): qty 1

## 2018-05-15 MED ORDER — PERFLUTREN LIPID MICROSPHERE
1.0000 mL | INTRAVENOUS | Status: AC | PRN
  Filled 2018-05-15: qty 10

## 2018-05-15 NOTE — Plan of Care (Signed)

## 2018-05-15 NOTE — Progress Notes (Addendum)
ANTICOAGULATION CONSULT NOTE - Follow Up Consult  Pharmacy Consult for Heparin and Coumadin Indication: ischemic leg, arterial thromboembolus  Allergies  Allergen Reactions  . Ciprofloxacin Hcl Hives  . Macrobid [Nitrofurantoin Macrocrystal] Hives, Shortness Of Breath and Rash  . Other Hives, Shortness Of Breath and Rash    NO "-CILLINS"!!!  . Penicillins Shortness Of Breath    Had had cephalosporins without incident  . Sulfa Antibiotics Hives, Shortness Of Breath and Rash  . Lexapro [Escitalopram Oxalate]     I just did not like it.    Patient Measurements: Height: 5\' 7"  (170.2 cm) Weight: 274 lb 0.5 oz (124.3 kg) IBW/kg (Calculated) : 61.6 Heparin Dosing Weight: 93 kg  Vital Signs: Temp: 97.8 F (36.6 C) (08/12 0405) Temp Source: Oral (08/12 0405) BP: 137/75 (08/12 0405) Pulse Rate: 84 (08/12 0405)  Labs: Recent Labs    05/13/18 0022  05/14/18 2423 05/14/18 0926 05/14/18 1917 05/15/18 0209 05/15/18 0734  HGB 7.7*  --  6.0* 6.0* 8.6* 8.4*  --   HCT 25.3*  --  20.5* 20.5* 27.7* 28.0*  --   PLT 421*  --  341  --  381 391  --   LABPROT  --   --  13.3  --   --  13.0  --   INR  --   --  1.02  --   --  0.99  --   HEPARINUNFRC 1.60*   < > 0.37  --   --  0.19* 0.20*  CREATININE 1.37*  --   --   --   --   --   --    < > = values in this interval not displayed.    Estimated Creatinine Clearance: 72.5 mL/min (A) (by C-G formula based on SCr of 1.37 mg/dL (H)).  Assessment:  42 yo F presented to the ED on 8/9 with leg pain. She was prescribed coumadin at 10 mg T/Th/Sat and 5 mg all other days, but stopped taking. INR 1.01 on admit. Now on heparin and warfarin for ischemic leg and arterial thromboembolism, s/p thrombectomy and angioplasty on 8/9.  Megace added 8/10 for abnormal uterine bleeding. Hgb down to 6.0 on 8/11 and Heparin was held for several hours on 8/11 for blood administration.    Heparin resume ~7pm last night at 1750 units/hr.  Initial heparin level was  subtherapeutic (0.19) ~2am, and same rate continued.  Level remains subtherapeutic (0.20) this am.  INR 0.99 after Coumadin 12.5 mg x 2 days.  Hgb improved.  Goal of Therapy:  INR 2-3 Heparin level 0.3-0.7 units/ml Monitor platelets by anticoagulation protocol: Yes   Plan:   Increase heparin drip to 1850 units/hr  Heparin level ~6 hrs after rate changed.  Increase Coumadin to 15 mg x 1 today.  Daily heparin level, PT/INR and CBC.  Monitor bleeding.  Arty Baumgartner, [place] Pager: [phone] or phone:  [phone] 05/15/2018,8:52 AM  Addendum:   Heparin level 0.29 this afternoon on 1850 units/hr.   Just below target range.   Will increased heparin drip to 1900 units/hr.   Next labs in am.  Consuello Masse, RPh 05/15/2018 4:49 PM

## 2018-05-15 NOTE — Progress Notes (Signed)
Pt ao x4. Complains of severe pain to left groin all the time. 9-10/10. Keeps asking for prn meds. Pt stated she pulled her skin glue off from right groin, when asked pt stated it was accidental when she was washing herself. Right groin site open, scant amount of serosanguinous drainage noted. Interdry applied. Vitals stable. Will continue to monitor.

## 2018-05-15 NOTE — Progress Notes (Signed)
Physical Therapy Treatment Patient Details Name: Terri Sparks MRN: [id number] DOB: Feb 09, 1976 Today's Date: 05/15/2018    History of Present Illness Terri Sparks is a 42 y.o. female with history of diabetes, hypertension, venous and arterial peripheral clots, PE, presents to emergency department with complaint of pain to the left leg, shortness of breath, chest pain.  Patient states her main issue today is left lower leg pain.  Patient was hospitalized in May for acute ischemic left leg.  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  She underwent thromboembolectomy of the left iliac and popliteal arteries and endarterectomy with angioplasty of the left superficial femoral artery.  Mechanical thrombectomy using the Penumbra device of the left common iliac, left external iliac, left common femoral, left profundofemoral, left superficial femoral, left popliteal, left anterior tibial, and left peroneal artery    PT Comments    Pt admitted with above diagnosis. Pt currently with functional limitations due to the deficits listed below (see PT Problem List). Pt was able to ambulate in halls with single UE support but at times using bil UE support when left LE pain increased.  Will benefit from rollator but pt is unsure of cost.  Wants to know if she can get rollator covered somehow.  Will defer to CM.  Pt will benefit from skilled PT to increase their independence and safety with mobility to allow discharge to the venue listed below.     Follow Up Recommendations  Outpatient PT     Equipment Recommendations  (rollator - pt is 270 lbs, bariatric 3N1)    Recommendations for Other Services       Precautions / Restrictions Precautions Precautions: Fall Restrictions Weight Bearing Restrictions: No    Mobility  Bed Mobility Overal bed mobility: Modified Independent Bed Mobility: Supine to Sit     Supine to sit:  Supervision        Transfers Overall transfer level: Needs assistance Equipment used: None Transfers: Sit to/from Stand Sit to Stand: Supervision            Ambulation/Gait Ambulation/Gait assistance: Supervision Gait Distance (Feet): 250 Feet Assistive device: None;IV Pole Gait Pattern/deviations: Step-to pattern   Gait velocity interpretation: <1.31 ft/sec, indicative of household ambulator General Gait Details: Pt can walk without holding IV pole but likes to have something to steady her, therefore holding onto IV pole most of walk.  Discussed that a rollator would be nice as she could sit and rest if needed.  If she can get one, pt would like a rollator.   Stairs             Wheelchair Mobility    Modified Rankin (Stroke Patients Only)       Balance Overall balance assessment: Mild deficits observed, not formally tested                                          Cognition Arousal/Alertness: Awake/alert Behavior During Therapy: WFL for tasks assessed/performed Overall Cognitive Status: Within Functional Limits for tasks assessed                                        Exercises      General Comments General comments (skin integrity, edema, etc.):  Pt states she is going to stay with her daughter at d/c and has several steps there.       Pertinent Vitals/Pain Pain Assessment: Faces Faces Pain Scale: Hurts little more Pain Location: LLE Pain Descriptors / Indicators: Tender Pain Intervention(s): Limited activity within patient's tolerance;Monitored during session;Repositioned    Home Living                      Prior Function            PT Goals (current goals can now be found in the care plan section) Progress towards PT goals: Progressing toward goals    Frequency    Min 3X/week      PT Plan Current plan remains appropriate    Co-evaluation              AM-PAC PT "6 Clicks" Daily  Activity  Outcome Measure  Difficulty turning over in bed (including adjusting bedclothes, sheets and blankets)?: None Difficulty moving from lying on back to sitting on the side of the bed? : None Difficulty sitting down on and standing up from a chair with arms (e.g., wheelchair, bedside commode, etc,.)?: None Help needed moving to and from a bed to chair (including a wheelchair)?: A Little Help needed walking in hospital room?: A Little Help needed climbing 3-5 steps with a railing? : A Little 6 Click Score: 21    End of Session Equipment Utilized During Treatment: Gait belt Activity Tolerance: Patient limited by fatigue Patient left: in bed;with call bell/phone within reach Nurse Communication: Mobility status PT Visit Diagnosis: Unsteadiness on feet (R26.81)     Time: 0813-8871 PT Time Calculation (min) (ACUTE ONLY): 13 min  Charges:  $Gait Training: 8-22 mins                     [facility] [phone] (pager)    Denice Paradise 05/15/2018, 3:54 PM

## 2018-05-15 NOTE — Progress Notes (Addendum)
Progress Note    05/15/2018 7:50 AM 3 Days Post-Op  Subjective:  Asleep and difficult to arouse but finally wakes; says she had some acid reflux and nauseated  Afebrile HR 80's-100's  401'U-272'Z systolic 36% RA  Vitals:   05/14/18 2344 05/15/18 0405  BP: 134/69 137/75  Pulse: 94 84  Resp: 20 20  Temp: 98 F (36.7 C) 97.8 F (36.6 C)  SpO2: 97% 99%    Physical Exam: Cardiac:  regular Lungs:  Non labored Incisions:  Right groin puncture stick open but looks okay.  Left groin erythema improving.  Incision healing and no evidence of infection.  Has good granulation tissue. Extremities:  Monophasic doppler signals left peroneal/PT   CBC    Component Value Date/Time   WBC 11.3 (H) 05/15/2018 0209   RBC 3.12 (L) 05/15/2018 0209   HGB 8.4 (L) 05/15/2018 0209   HGB 11.0 (L) 01/27/2018 1157   HCT 28.0 (L) 05/15/2018 0209   HCT 37.6 01/27/2018 1157   PLT 391 05/15/2018 0209   PLT 490 (H) 01/27/2018 1157   MCV 89.7 05/15/2018 0209   MCV 78 (L) 01/27/2018 1157   MCH 26.9 05/15/2018 0209   MCHC 30.0 05/15/2018 0209   RDW 16.9 (H) 05/15/2018 0209   RDW 17.1 (H) 01/27/2018 1157   LYMPHSABS 2.6 05/12/2018 0613   MONOABS 0.8 05/12/2018 0613   EOSABS 0.2 05/12/2018 0613   BASOSABS 0.1 05/12/2018 0613    BMET    Component Value Date/Time   NA 136 05/13/2018 0022   NA 134 01/27/2018 1157   K 4.7 05/13/2018 0022   CL 108 05/13/2018 0022   CO2 21 (L) 05/13/2018 0022   GLUCOSE 291 (H) 05/13/2018 0022   BUN 10 05/13/2018 0022   BUN 14 01/27/2018 1157   CREATININE 1.37 (H) 05/13/2018 0022   CALCIUM 8.5 (L) 05/13/2018 0022   GFRNONAA 46 (L) 05/13/2018 0022   GFRAA 54 (L) 05/13/2018 0022    INR    Component Value Date/Time   INR 0.99 05/15/2018 0209     Intake/Output Summary (Last 24 hours) at 05/15/2018 0750 Last data filed at 05/15/2018 0408 Gross per 24 hour  Intake 1193.87 ml  Output -  Net 1193.87 ml     Assessment:  42 y.o. female is s/p:  1.  Ultrasound-guided access, right femoral artery 2. Abdominal aortogram 3. Left leg runoff 4. Mechanical thrombectomy using thePenumbradevice of the left common iliac, left external iliac, left common femoral, left profundofemoral, left superficial femoral, left popliteal, left anterior tibial, and left peroneal artery 5. Balloon angioplasty, left anterior tibial artery 6. Intra-arterial administration of TPA and nitroglycerin 7. Closure device (Proglide)   3 Days Post-Op  Plan: -pt with monophasic doppler signals left peroneal and PT -just received phenergan a little while ago-very difficult to arouse, but she did awake.  Will decrease dose.  Says that she had some acid reflux this am and nauseated. -appreciate Dr. Glenna Durand assistance with this pt.   -acute blood loss anemia-hgb improved after transfusion. -DVT prophylaxis:  Continue heparin to coumadin bridge.  INR 0.99 today after 2 doses of coumadin 12.5mg .   -continue to mobilize -will need hematology referral as outpatient   Leontine Locket, PA-C [facility] [phone] 05/15/2018 7:50 AM   I agree with the above.  I have seen and evaluated the patient.  She has some tingling in her left foot.  There is a Doppler posterior tibial and peroneal signal.  She has normal motor  function.  -Continue IV heparin with conversion to Coumadin -On Megace for vaginal bleeding which appears to be decreasing -Acute blood loss anemia: She had an appropriate ponce to blood transfusion.  We will repeat CBC in the morning -Check creatinine tomorrow to make sure this remains stable -Outpatient hematology referral for hypercoagulable state -Discussed the importance of smoking cessation.  She states she has had her last cigarette -2D echo to rule out cardiac source  Annamarie Major

## 2018-05-15 NOTE — Progress Notes (Signed)
Inpatient Diabetes Program Recommendations  AACE/ADA: New Consensus Statement on Inpatient Glycemic Control (2015)  Target Ranges:  Prepandial:   less than 140 mg/dL      Peak postprandial:   less than 180 mg/dL (1-2 hours)      Critically ill patients:  140 - 180 mg/dL   Lab Results  Component Value Date   GLUCAP 166 (H) 05/15/2018   HGBA1C 10.3 01/27/2018    Review of Glycemic Control  Diabetes history: DM2 Outpatient Diabetes medications: Lantus 70 units + Novolog 5-15 units tid Current orders for Inpatient glycemic control: Lantus 35 units + Novolog moderate scale correction tid  Inpatient Diabetes Program Recommendations:   -Add Novolog 4 units tid meal coverage if eats 50%  Thank you, Bethena Roys E. Joana Nolton, RN, MSN, CDE  Diabetes Coordinator Inpatient Glycemic Control Team Team Pager [phone] (8am-5pm) 05/15/2018 10:35 AM

## 2018-05-15 NOTE — Progress Notes (Signed)
  Echocardiogram 2D Echocardiogram has been performed.  Jennette Dubin 05/15/2018, 3:58 PM

## 2018-05-16 ENCOUNTER — Encounter (HOSPITAL_COMMUNITY): Payer: Self-pay | Admitting: Surgery

## 2018-05-16 DIAGNOSIS — Z0279 Encounter for issue of other medical certificate: Secondary | ICD-10-CM

## 2018-05-16 LAB — BASIC METABOLIC PANEL
Anion gap: 12 (ref 5–15)
BUN: 13 mg/dL (ref 6–20)
CO2: 28 mmol/L (ref 22–32)
Calcium: 9.9 mg/dL (ref 8.9–10.3)
Chloride: 98 mmol/L (ref 98–111)
Creatinine, Ser: 1.55 mg/dL — ABNORMAL HIGH (ref 0.44–1.00)
GFR calc Af Amer: 46 mL/min — ABNORMAL LOW (ref >60)
GFR calc non Af Amer: 40 mL/min — ABNORMAL LOW (ref >60)
Glucose, Bld: 280 mg/dL — ABNORMAL HIGH (ref 70–99)
Potassium: 4.4 mmol/L (ref 3.5–5.1)
Sodium: 138 mmol/L (ref 135–145)

## 2018-05-16 LAB — GLUCOSE, CAPILLARY
Glucose-Capillary: 258 mg/dL — ABNORMAL HIGH (ref 70–99)
Glucose-Capillary: 199 mg/dL — ABNORMAL HIGH (ref 70–99)
Glucose-Capillary: 234 mg/dL — ABNORMAL HIGH (ref 70–99)
Glucose-Capillary: 232 mg/dL — ABNORMAL HIGH (ref 70–99)
Glucose-Capillary: 256 mg/dL — ABNORMAL HIGH (ref 70–99)
Glucose-Capillary: 198 mg/dL — ABNORMAL HIGH (ref 70–99)
Glucose-Capillary: 214 mg/dL — ABNORMAL HIGH (ref 70–99)

## 2018-05-16 LAB — CBC
HCT: 28.9 % — ABNORMAL LOW (ref 36.0–46.0)
Hemoglobin: 8.8 g/dL — ABNORMAL LOW (ref 12.0–15.0)
MCH: 27.5 pg (ref 26.0–34.0)
MCHC: 30.4 g/dL (ref 30.0–36.0)
MCV: 90.3 fL (ref 78.0–100.0)
Platelets: 388 10*3/uL (ref 150–400)
RBC: 3.2 MIL/uL — ABNORMAL LOW (ref 3.87–5.11)
RDW: 17.1 % — ABNORMAL HIGH (ref 11.5–15.5)
WBC: 10.5 10*3/uL (ref 4.0–10.5)

## 2018-05-16 LAB — HEPARIN LEVEL (UNFRACTIONATED): Heparin Unfractionated: 0.33 IU/mL (ref 0.30–0.70)

## 2018-05-16 LAB — PROTIME-INR
INR: 1.4
Prothrombin Time: 17.1 seconds — ABNORMAL HIGH (ref 11.4–15.2)

## 2018-05-16 MED ORDER — WARFARIN SODIUM 2.5 MG PO TABS
12.5000 mg | ORAL_TABLET | Freq: Once | ORAL | Status: AC
  Administered 2018-05-16: 12.5 mg via ORAL
  Filled 2018-05-16: qty 1

## 2018-05-16 MED ORDER — PATIENT'S GUIDE TO USING COUMADIN BOOK
Freq: Once | Status: DC
  Filled 2018-05-16 (×2): qty 1

## 2018-05-16 NOTE — Progress Notes (Signed)
Inpatient Diabetes Program Recommendations  AACE/ADA: New Consensus Statement on Inpatient Glycemic Control (2015)  Target Ranges:  Prepandial:   less than 140 mg/dL      Peak postprandial:   less than 180 mg/dL (1-2 hours)      Critically ill patients:  140 - 180 mg/dL   Lab Results  Component Value Date   GLUCAP 208 (H) 05/15/2018   HGBA1C 10.3 01/27/2018    Review of Glycemic Control Results for Terri Sparks, Terri Sparks (MRN [id number]) as of 05/16/2018 10:09  Ref. Range 05/14/2018 11:37 05/14/2018 16:13 05/15/2018 08:49 05/15/2018 12:04 05/15/2018 16:54  Glucose-Capillary Latest Ref Range: 70 - 99 mg/dL 177 (H) 255 (H) 166 (H) 243 (H) 208 (H)   Diabetes history: DM2 Outpatient Diabetes medications: Lantus 70 units + Novolog 5-15 units tid Current orders for Inpatient glycemic control: Lantus 35 units + Novolog moderate scale correction tid  Inpatient Diabetes Program Recommendations:   Noted hyperglycemia -Increase Lantus to 50 units daily -Add Novolog 4 units tid meal coverage if eats 50% -Add Novolog hs 0-5 units  -A1c to determine glycemic control  Thank you, Bethena Roys E. Sedona Wenk, RN, MSN, CDE  Diabetes Coordinator Inpatient Glycemic Control Team Team Pager [phone] (8am-5pm) 05/16/2018 10:11 AM

## 2018-05-16 NOTE — Plan of Care (Signed)

## 2018-05-16 NOTE — Progress Notes (Signed)
ANTICOAGULATION CONSULT NOTE - Follow Up Consult  Pharmacy Consult for Heparin and Coumadin Indication: ischemic leg, arterial thromboembolus  Allergies  Allergen Reactions  . Ciprofloxacin Hcl Hives  . Macrobid [Nitrofurantoin Macrocrystal] Hives, Shortness Of Breath and Rash  . Other Hives, Shortness Of Breath and Rash    NO "-CILLINS"!!!  . Penicillins Shortness Of Breath    Had had cephalosporins without incident  . Sulfa Antibiotics Hives, Shortness Of Breath and Rash  . Lexapro [Escitalopram Oxalate]     I just did not like it.    Patient Measurements: Height: 5\' 7"  (170.2 cm) Weight: 274 lb 0.5 oz (124.3 kg) IBW/kg (Calculated) : 61.6 Heparin Dosing Weight: 93 kg  Vital Signs: Temp: 98.7 F (37.1 C) (08/13 0353) Temp Source: Oral (08/13 0353) BP: 143/62 (08/13 0353) Pulse Rate: 100 (08/13 0353)  Labs: Recent Labs    05/14/18 0646  05/14/18 1917 05/15/18 0209 05/15/18 0734 05/15/18 1408 05/16/18 0343  HGB 6.0*   < > 8.6* 8.4*  --   --  8.8*  HCT 20.5*   < > 27.7* 28.0*  --   --  28.9*  PLT 341  --  381 391  --   --  388  LABPROT 13.3  --   --  13.0  --   --  17.1*  INR 1.02  --   --  0.99  --   --  1.40  HEPARINUNFRC 0.37  --   --  0.19* 0.20* 0.29* 0.33  CREATININE  --   --   --   --   --   --  1.55*   < > = values in this interval not displayed.    Estimated Creatinine Clearance: 64.1 mL/min (A) (by C-G formula based on SCr of 1.55 mg/dL (H)).  Assessment:  42 yo F presented to the ED on 8/9 with leg pain. She was prescribed coumadin at 10 mg T/Th/Sat and 5 mg all other days, but stopped taking. INR 1.01 on admit. Now on heparin and warfarin for ischemic leg and arterial thromboembolism, s/p thrombectomy and angioplasty on 8/9.  Megace added 8/10 for abnormal uterine bleeding. Hgb down to 6.0 on 8/11 and Heparin was held for several hours on 8/11 for blood administration.    Heparin level therapeutic (0.33) on 1900 units/hr.    INR up to 1.40 after  Coumadin 12.5 mg x 2 days, then 15 mg x 1. Expect INR to increase more by am. Hgb improved.   Bleeding reported to have stopped.  Goal of Therapy:  INR 2-3 Heparin level 0.3-0.7 units/ml Monitor platelets by anticoagulation protocol: Yes   Plan:   Continue heparin drip at 1900 units/hr  Coumadin 12.5 mg x 1 today.  Daily heparin level, PT/INR and CBC.  Monitor bleeding.  Arty Baumgartner, [place] Pager: [phone] or phone:  [phone] 05/16/2018,11:17 AM

## 2018-05-16 NOTE — Progress Notes (Addendum)
[facility] [place]  Subjective  - Depressed and anxious.  Still CC: nausea   Objective (!) 143/62 100 98.7 F (37.1 C) (Oral) 17 99%  Intake/Output Summary (Last 24 hours) at 05/16/2018 0714 Last data filed at 05/16/2018 0000 Gross per 24 hour  Intake 716.58 ml  Output 1 ml  Net 715.58 ml    Left groin superficial wound healing.  Nystatin powder and dry dressing placed over left groin.  Right groin clean and dry without hematoma.  Nystatin powder placed over right groin area for prevention.  Doppler monophasic AT, peroneal Heart RRR Lungs non labored    Assessment/Planning: POD # 4  1. Ultrasound-guided access, right femoral artery 2. Abdominal aortogram 3. Left leg runoff 4. Mechanical thrombectomy using thePenumbradevice of the left common iliac, left external iliac, left common femoral, left profundofemoral, left superficial femoral, left popliteal, left anterior tibial, and left peroneal artery 5. Balloon angioplasty, left anterior tibial artery 6. Intra-arterial administration of TPA and nitroglycerin 7. Closure device (Proglide)   CC nausea and pain.  Episode of nausea last night.   INR 1.4 PT for stairs today she plans to go home with her daughter. Coumadin dose per pharmacy Possibly d/c tomorrow? Pending final report on Echo We will have our office referral for hematology out patient   Roxy Horseman 05/16/2018 7:14 AM --  Laboratory Lab Results: Recent Labs    05/15/18 0209 05/16/18 0343  WBC 11.3* 10.5  HGB 8.4* 8.8*  HCT 28.0* 28.9*  PLT 391 388   BMET Recent Labs    05/16/18 0343  NA 138  K 4.4  CL 98  CO2 28  GLUCOSE 280*  BUN 13  CREATININE 1.55*  CALCIUM 9.9    COAG Lab Results  Component Value Date   INR 1.40 05/16/2018   INR 0.99 05/15/2018   INR 1.02 05/14/2018   No results found for: PTT

## 2018-05-16 NOTE — Progress Notes (Signed)
Occupational Therapy Treatment Patient Details Name: Terri Sparks MRN: [id number] DOB: [birth year] Today's Date: 05/16/2018    History of present illness Terri Sparks is a 42 y.o. female with history of diabetes, hypertension, venous and arterial peripheral clots, PE, presents to emergency department with complaint of pain to the left leg, shortness of breath, chest pain.  Patient states her main issue today is left lower leg pain.  Patient was hospitalized in May for acute ischemic left leg.  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  She underwent thromboembolectomy of the left iliac and popliteal arteries and endarterectomy with angioplasty of the left superficial femoral artery.  Mechanical thrombectomy using the Penumbra device of the left common iliac, left external iliac, left common femoral, left profundofemoral, left superficial femoral, left popliteal, left anterior tibial, and left peroneal artery   OT comments  Pt progressing well towards established OT goals. Pt performing toileting, simulated shower transfer, and meal preparation task with supervision demonstrating near baseline function. Continue to present with decreased activity tolerance and required standing rest breaks during functional mobility in hallway with IADL task. Provided pt with education and handout on energy conservation to increase safety and independence with ADLs and IADLs at home. Answered all pt questions, and pt very thankful for information and OT listening to her anxiety about dc. All acute OT needs met and will sign off. Thank you.   Follow Up Recommendations  No OT follow up    Equipment Recommendations  None recommended by OT    Recommendations for Other Services      Precautions / Restrictions Precautions Precautions: Fall Restrictions Weight Bearing Restrictions: No       Mobility Bed Mobility                General bed mobility comments: EOB upon arrival  Transfers Overall transfer level: Needs assistance Equipment used: None Transfers: Sit to/from Stand Sit to Stand: Supervision         General transfer comment: supervision for safety    Balance Overall balance assessment: Mild deficits observed, not formally tested                                         ADL either performed or assessed with clinical judgement   ADL Overall ADL's : Needs assistance/impaired     Grooming: Supervision/safety;Standing;Wash/dry hands                 Lower Body Dressing Details (indicate cue type and reason): Educating pt on compensatory techniques for LB dressing and donning/doffing socks. Pt able to bring ankles to knees.  Toilet Transfer: Supervision/safety;Ambulation;Regular Museum/gallery exhibitions officer and Hygiene: Supervision/safety   Tub/ Shower Transfer: Walk-in shower;Supervision/safety;Ambulation     General ADL Comments: Pt performing simulated shower transfer, toileting, and meal preparation task with supervision demonstrating near baseline function. Pt performing a coffee makign task walking down to coffee station to make coffee. Pt performing at supervision level. Providing pt with education on energy conseravtion for home and pt verablized understanding.     Vision       Perception     Praxis      Cognition Arousal/Alertness: Awake/alert Behavior During Therapy: WFL for tasks assessed/performed Overall Cognitive Status: Within Functional Limits for tasks assessed  Exercises     Shoulder Instructions       General Comments Pt reporting anxiety and stress about dc home with son or with daughter. Pt reporting anxiety. Used active listening to facilitate discussion and talk through dc plans    Pertinent Vitals/ Pain       Pain Assessment: Faces Faces Pain Scale: Hurts a  little bit Pain Location: LLE Pain Descriptors / Indicators: Tender Pain Intervention(s): Monitored during session;Repositioned  Home Living                                          Prior Functioning/Environment              Frequency  Min 2X/week        Progress Toward Goals  OT Goals(current goals can now be found in the care plan section)  Progress towards OT goals: Progressing toward goals  Acute Rehab OT Goals Patient Stated Goal: home with daugther OT Goal Formulation: With patient Time For Goal Achievement: 05/27/18 Potential to Achieve Goals: Good ADL Goals Pt Will Perform Grooming: with modified independence;standing Pt Will Perform Upper Body Dressing: with modified independence;standing Pt Will Perform Lower Body Dressing: with modified independence;sit to/from stand Pt Will Transfer to Toilet: with modified independence;regular height toilet Pt Will Perform Toileting - Clothing Manipulation and hygiene: with modified independence;sit to/from stand  Plan Discharge plan remains appropriate    Co-evaluation                 AM-PAC PT "6 Clicks" Daily Activity     Outcome Measure   Help from another person eating meals?: None Help from another person taking care of personal grooming?: A Little Help from another person toileting, which includes using toliet, bedpan, or urinal?: A Little Help from another person bathing (including washing, rinsing, drying)?: A Little Help from another person to put on and taking off regular upper body clothing?: A Little Help from another person to put on and taking off regular lower body clothing?: A Little 6 Click Score: 19    End of Session Equipment Utilized During Treatment: Rolling walker  OT Visit Diagnosis: Unsteadiness on feet (R26.81);Muscle weakness (generalized) (M62.81);History of falling (Z91.81)   Activity Tolerance Patient tolerated treatment well   Patient Left in chair;with  call bell/phone within reach   Nurse Communication Mobility status        Time: 1102-1130 OT Time Calculation (min): 28 min  Charges: OT General Charges $OT Visit: 1 Visit OT Treatments $Self Care/Home Management : 23-37 mins  [place], OTR/L Acute Rehab Pager: [phone] Office: [place] 05/16/2018, 1:43 PM

## 2018-05-16 NOTE — Progress Notes (Addendum)
Physical Therapy Treatment & Discharge Patient Details Name: Terri Sparks MRN: [id number] DOB: 01-06-1976 Today's Date: 05/16/2018    History of Present Illness Pt is a 42 y.o. female admitted 05/12/18 with LLE pain, SOB and chest pain. Now s/p thromboembolectomy of L iliac and popliteal arteries, L femoral endarterectomy, and LLE mechanical thrombectomy. PMH includes DM, HTN, PE, and recent admite (02/2018) for LLE ischemia.   PT Comments    Pt has progressed well with mobility; continues to c/o L posterior thigh soreness. Indep with hallway ambulation; initiated stair training, supervision for safety. Pt will have necessary support from daughter upon discharge. Encouraged frequent LLE ROM and ambulation. Pt has met short-term acute PT goals; reports no further questions or concerns. Per CM, does not qualify for rollator; educ on options for purchasing a RW if needed. Will d/c acute PT.    Follow Up Recommendations  Outpatient PT     Equipment Recommendations  (bariatric rollator)    Recommendations for Other Services       Precautions / Restrictions Precautions Precautions: None Restrictions Weight Bearing Restrictions: No    Mobility  Bed Mobility Overal bed mobility: Independent Bed Mobility: Supine to Sit              Transfers Overall transfer level: Independent Equipment used: None Transfers: Sit to/from Stand Sit to Stand: Independent            Ambulation/Gait Ambulation/Gait assistance: Independent Social research officer, government (Feet): 250 Feet Assistive device: None;IV Pole Gait Pattern/deviations: Step-through pattern;Decreased stride length   Gait velocity interpretation: 1.31 - 2.62 ft/sec, indicative of limited community ambulator General Gait Details: Indep with slow, steady amb with and without IV pole   Stairs Stairs: Yes Stairs assistance: Supervision Stair Management: One rail Left;Step to pattern;Forwards Number of Stairs: 4 General stair  comments: Ascend/descended 4 steps with single rail support; educ on technique to compensate for LLE pain. Supervision for safety. Discussed energy conservation and rest breaks as needed.    Wheelchair Mobility    Modified Rankin (Stroke Patients Only)       Balance Overall balance assessment: Mild deficits observed, not formally tested                                          Cognition Arousal/Alertness: Awake/alert Behavior During Therapy: WFL for tasks assessed/performed Overall Cognitive Status: Within Functional Limits for tasks assessed                                        Exercises      General Comments        Pertinent Vitals/Pain Pain Assessment: Faces Faces Pain Scale: Hurts little more Pain Location: LLE Pain Descriptors / Indicators: Cramping Pain Intervention(s): Limited activity within patient's tolerance    Home Living                      Prior Function            PT Goals (current goals can now be found in the care plan section) Acute Rehab PT Goals PT Goal Formulation: All assessment and education complete, DC therapy Progress towards PT goals: Goals met and updated - see care plan    Frequency    Min 3X/week  PT Plan Current plan remains appropriate    Co-evaluation              AM-PAC PT "6 Clicks" Daily Activity  Outcome Measure  Difficulty turning over in bed (including adjusting bedclothes, sheets and blankets)?: None Difficulty moving from lying on back to sitting on the side of the bed? : None Difficulty sitting down on and standing up from a chair with arms (e.g., wheelchair, bedside commode, etc,.)?: None Help needed moving to and from a bed to chair (including a wheelchair)?: None Help needed walking in hospital room?: None Help needed climbing 3-5 steps with a railing? : A Little 6 Click Score: 23    End of Session Equipment Utilized During Treatment: Gait  belt Activity Tolerance: Patient tolerated treatment well Patient left: in bed;with call bell/phone within reach Nurse Communication: Mobility status PT Visit Diagnosis: Unsteadiness on feet (R26.81)     Time: 3532-9924 PT Time Calculation (min) (ACUTE ONLY): 16 min  Charges:  $Gait Training: 8-22 mins                    Mabeline Caras, PT, DPT Acute Rehab Services  Pager: [phone] 05/16/2018, 3:04 PM

## 2018-05-17 LAB — HEPARIN LEVEL (UNFRACTIONATED)
Heparin Unfractionated: 0.25 IU/mL — ABNORMAL LOW (ref 0.30–0.70)
Heparin Unfractionated: 0.25 IU/mL — ABNORMAL LOW (ref 0.30–0.70)

## 2018-05-17 LAB — CBC
HCT: 28.3 % — ABNORMAL LOW (ref 36.0–46.0)
Hemoglobin: 8.7 g/dL — ABNORMAL LOW (ref 12.0–15.0)
MCH: 27.9 pg (ref 26.0–34.0)
MCHC: 30.7 g/dL (ref 30.0–36.0)
MCV: 90.7 fL (ref 78.0–100.0)
Platelets: 395 10*3/uL (ref 150–400)
RBC: 3.12 MIL/uL — ABNORMAL LOW (ref 3.87–5.11)
RDW: 17.7 % — ABNORMAL HIGH (ref 11.5–15.5)
WBC: 11.2 10*3/uL — ABNORMAL HIGH (ref 4.0–10.5)

## 2018-05-17 LAB — GLUCOSE, CAPILLARY
Glucose-Capillary: 263 mg/dL — ABNORMAL HIGH (ref 70–99)
Glucose-Capillary: 280 mg/dL — ABNORMAL HIGH (ref 70–99)
Glucose-Capillary: 257 mg/dL — ABNORMAL HIGH (ref 70–99)
Glucose-Capillary: 204 mg/dL — ABNORMAL HIGH (ref 70–99)

## 2018-05-17 LAB — PROTIME-INR
INR: 1.72
Prothrombin Time: 20.1 seconds — ABNORMAL HIGH (ref 11.4–15.2)

## 2018-05-17 MED ORDER — SIMETHICONE 80 MG PO CHEW
80.0000 mg | CHEWABLE_TABLET | Freq: Four times a day (QID) | ORAL | Status: DC | PRN
  Administered 2018-05-17 – 2018-05-19 (×4): 80 mg via ORAL
  Filled 2018-05-17 (×4): qty 1

## 2018-05-17 MED ORDER — WARFARIN SODIUM 2.5 MG PO TABS
12.5000 mg | ORAL_TABLET | ORAL | Status: AC
  Administered 2018-05-17: 12.5 mg via ORAL
  Filled 2018-05-17: qty 1

## 2018-05-17 NOTE — Progress Notes (Signed)
ANTICOAGULATION CONSULT NOTE - Follow Up Consult  Pharmacy Consult for Heparin and Coumadin Indication: ischemic leg, arterial thromboembolus  Allergies  Allergen Reactions  . Ciprofloxacin Hcl Hives  . Macrobid [Nitrofurantoin Macrocrystal] Hives, Shortness Of Breath and Rash  . Other Hives, Shortness Of Breath and Rash    NO "-CILLINS"!!!  . Penicillins Shortness Of Breath    Had had cephalosporins without incident  . Sulfa Antibiotics Hives, Shortness Of Breath and Rash  . Lexapro [Escitalopram Oxalate]     I just did not like it.   Patient Measurements: Height: 5\' 7"  (170.2 cm) Weight: 287 lb 11.2 oz (130.5 kg) IBW/kg (Calculated) : 61.6 Heparin Dosing Weight: 93 kg  Vital Signs: Temp: 99 F (37.2 C) (08/14 0900) Temp Source: Oral (08/14 0900) BP: 125/62 (08/14 0405) Pulse Rate: 107 (08/14 0900)  Labs: Recent Labs    05/15/18 0209  05/16/18 0343 05/17/18 0258 05/17/18 1014  HGB 8.4*  --  8.8* 8.7*  --   HCT 28.0*  --  28.9* 28.3*  --   PLT 391  --  388 395  --   LABPROT 13.0  --  17.1* 20.1*  --   INR 0.99  --  1.40 1.72  --   HEPARINUNFRC 0.19*   < > 0.33 0.25* 0.25*  CREATININE  --   --  1.55*  --   --    < > = values in this interval not displayed.   Estimated Creatinine Clearance: 65.9 mL/min (A) (by C-G formula based on SCr of 1.55 mg/dL (H)).  Assessment:  42 yo F presented to the ED on 8/9 with leg pain. She was prescribed coumadin at 10 mg T/Th/Sat and 5 mg all other days, but stopped taking. INR 1.01 on admit. Now on heparin and warfarin for ischemic leg and arterial thromboembolism, s/p thrombectomy and angioplasty on 8/9.  Megace added 8/10 for abnormal uterine bleeding.  Currently on Megesterol 80mg  bid but GYN wants her to be discharged on 40mg  bid.... Have pended discharge orders for MD.    Heparin level low on rate of 2000 units/hr.  INR trending higher - now 1.7, hopefully, within desired goal range in AM.  Bleeding reported to have  stopped.  Goal of Therapy:  INR 2-3 Heparin level 0.3-0.7 units/ml Monitor platelets by anticoagulation protocol: Yes   Plan:   Increase IV heparin drip to 2200 units/hr - check 8 hour level and adjust  Coumadin 12.5 mg x 1 today.  Daily heparin level, PT/INR and CBC.  Monitor bleeding.  Rober Minion, PharmD., MS Clinical Pharmacist Pager:  [phone] Thank you for allowing pharmacy to be part of this patients care team. 05/17/2018,12:46 PM

## 2018-05-17 NOTE — Care Management Note (Signed)
Case Management Note Marvetta Gibbons RN, BSN Unit 4E- RN Care Coordinator  [phone]  Patient Details  Name: Terri Sparks MRN: [id number] Date of Birth: [birth year]  Subjective/Objective:  Pt admitted with ischemic leg, s/p thrombectomy with angioplasty                   Action/Plan: PTA pt lived at home, to stay with her daughter at discharge. PT recommending outpt PT- pt is uninsured- would be out of pocket expense- CM will speak with pt about this and see if she wants referral or not. At time that CM went to see pt she had just taken some meds and asked CM to come back in am, pt did ask for rollator which CM can not provide under charity- can get pt a RW which she states she wants. Pt is [facility] at [facility]. Was last MATCHed for medication assistance 6/19- and is not eligible for assistance at this time. CM will f/u on outpt PT needs in the am 8/15  Expected Discharge Date:                  Expected Discharge Plan:  OP Rehab  In-House Referral:     Discharge planning Services  CM Consult, Medication Assistance  Post Acute Care Choice:  Durable Medical Equipment Choice offered to:  Patient  DME Arranged:  [facility] rolling DME Agency:  [place]:    [facility] Agency:     Status of Service:  In process, will continue to follow  If discussed at Long Length of Stay Meetings, dates discussed:    Additional Comments:  Dawayne Patricia, RN 05/17/2018, 5:07 PM

## 2018-05-17 NOTE — Progress Notes (Addendum)
[facility] [place]  Subjective  - Anious about going home.  States her daughter can't get her until tomorrow.   Objective 125/62 (!) 103 99 F (37.2 C) (Oral) (!) 33 98%  Intake/Output Summary (Last 24 hours) at 05/17/2018 0849 Last data filed at 05/17/2018 0400 Gross per 24 hour  Intake 1090.52 ml  Output -  Net 1090.52 ml    Doppler DP/PT B signals Left groin superficial wound clean and dry Right groin soft without hematoma Heart Tachy 100-120 Lungs non labored breathing  Assessment/Planning: POD # 5 1. Ultrasound-guided access, right femoral artery 2. Abdominal aortogram 3. Left leg runoff 4. Mechanical thrombectomy using thePenumbradevice of the left common iliac, left external iliac, left common femoral, left profundofemoral, left superficial femoral, left popliteal, left anterior tibial, and left peroneal artery 5. Balloon angioplasty, left anterior tibial artery 6. Intra-arterial administration of TPA and nitroglycerin 7. Closure device (Proglide)  N/V/D INR 1.7 Out patient PT recommended consulted care manager [facility]  Will have our office refer her to hematologist out patient Plan for discharge tomorrow.  Roxy Horseman 05/17/2018 8:49 AM --  Laboratory Lab Results: Recent Labs    05/16/18 0343 05/17/18 0258  WBC 10.5 11.2*  HGB 8.8* 8.7*  HCT 28.9* 28.3*  PLT 388 395   BMET Recent Labs    05/16/18 0343  NA 138  K 4.4  CL 98  CO2 28  GLUCOSE 280*  BUN 13  CREATININE 1.55*  CALCIUM 9.9    COAG Lab Results  Component Value Date   INR 1.72 05/17/2018   INR 1.40 05/16/2018   INR 0.99 05/15/2018   No results found for: PTT  I have examined the patient, reviewed and agree with above.  Curt Jews, MD 05/17/2018 3:29 PM

## 2018-05-17 NOTE — Progress Notes (Addendum)
ANTICOAGULATION CONSULT NOTE - Follow Up Consult  Pharmacy Consult for heparin Indication: ischemic leg/arterial thromboembolus  Labs: Recent Labs    05/15/18 0209  05/15/18 1408 05/16/18 0343 05/17/18 0258  HGB 8.4*  --   --  8.8* 8.7*  HCT 28.0*  --   --  28.9* 28.3*  PLT 391  --   --  388 395  LABPROT 13.0  --   --  17.1* 20.1*  INR 0.99  --   --  1.40 1.72  HEPARINUNFRC 0.19*   < > 0.29* 0.33 0.25*  CREATININE  --   --   --  1.55*  --    < > = values in this interval not displayed.    Assessment: 42yo female subtherapeutic on heparin after one level at lower end of goal; RN reports that heparin bag had run dry around when lab was drawn and he's unsure how long it wasn't running, which could contribute to the low level, but since the patient was at lower end of goal there is room to move with a rate change; RN notes no signs of bleeding during his shift, Hgb low but stable.  Goal of Therapy:  Heparin level 0.3-0.7 units/ml   Plan:  Will increase heparin gtt by ~1 unit/kgABW/hr to 2000 units/hr and check level in 6 hours.    Wynona Neat, PharmD, BCPS  05/17/2018,4:22 AM

## 2018-05-18 ENCOUNTER — Other Ambulatory Visit: Payer: Self-pay | Admitting: Internal Medicine

## 2018-05-18 DIAGNOSIS — Z794 Long term (current) use of insulin: Principal | ICD-10-CM

## 2018-05-18 DIAGNOSIS — E1165 Type 2 diabetes mellitus with hyperglycemia: Principal | ICD-10-CM

## 2018-05-18 DIAGNOSIS — IMO0001 Reserved for inherently not codable concepts without codable children: Secondary | ICD-10-CM

## 2018-05-18 LAB — GLUCOSE, CAPILLARY
Glucose-Capillary: 300 mg/dL — ABNORMAL HIGH (ref 70–99)
Glucose-Capillary: 262 mg/dL — ABNORMAL HIGH (ref 70–99)
Glucose-Capillary: 191 mg/dL — ABNORMAL HIGH (ref 70–99)
Glucose-Capillary: 178 mg/dL — ABNORMAL HIGH (ref 70–99)

## 2018-05-18 LAB — CBC
HCT: 31.3 % — ABNORMAL LOW (ref 36.0–46.0)
Hemoglobin: 9.3 g/dL — ABNORMAL LOW (ref 12.0–15.0)
MCH: 27 pg (ref 26.0–34.0)
MCHC: 29.7 g/dL — ABNORMAL LOW (ref 30.0–36.0)
MCV: 91 fL (ref 78.0–100.0)
Platelets: 384 10*3/uL (ref 150–400)
RBC: 3.44 MIL/uL — ABNORMAL LOW (ref 3.87–5.11)
RDW: 17.5 % — ABNORMAL HIGH (ref 11.5–15.5)
WBC: 9.7 10*3/uL (ref 4.0–10.5)

## 2018-05-18 LAB — HEPARIN LEVEL (UNFRACTIONATED): Heparin Unfractionated: 0.38 IU/mL (ref 0.30–0.70)

## 2018-05-18 LAB — PROTIME-INR
INR: 2.09
Prothrombin Time: 23.3 seconds — ABNORMAL HIGH (ref 11.4–15.2)

## 2018-05-18 MED ORDER — WARFARIN SODIUM 10 MG PO TABS
12.5000 mg | ORAL_TABLET | Freq: Once | ORAL | Status: AC
  Administered 2018-05-18: 12.5 mg via ORAL
  Filled 2018-05-18: qty 1

## 2018-05-18 MED ORDER — MEGESTROL ACETATE 40 MG PO TABS: 80.0000 mg | ORAL_TABLET | Freq: Two times a day (BID) | ORAL | 0 refills | Status: DC

## 2018-05-18 MED ORDER — OXYCODONE-ACETAMINOPHEN 5-325 MG PO TABS: 1.0000 | ORAL_TABLET | ORAL | 0 refills | Status: DC | PRN

## 2018-05-18 MED ORDER — WARFARIN SODIUM 5 MG PO TABS: 5.0000 mg | ORAL_TABLET | ORAL | 1 refills | Status: DC

## 2018-05-18 MED FILL — FERROUS SULFATE 325 MG TAB: 325 (65 FE) | 30 days supply | Qty: 30 | Fill #2

## 2018-05-18 MED FILL — hydrOXYzine HCL 25 MG TABS: 25 | 30 days supply | Qty: 120 | Fill #2

## 2018-05-18 MED FILL — WARFARIN SODIUM 5 MG TABLET: 5 | 30 days supply | Qty: 60 | Fill #0

## 2018-05-18 MED FILL — MEGESTROL 40 MG TABLET: 40 | 30 days supply | Qty: 60 | Fill #0

## 2018-05-18 MED FILL — BUPROPION SR 150 MG TABLET: 150 | 30 days supply | Qty: 60 | Fill #2

## 2018-05-18 MED FILL — $LANTUS 100 UNITS/ML VIAL: 100 | 28 days supply | Qty: 20 | Fill #3

## 2018-05-18 MED FILL — GABAPENTIN 300 MG CAPSULE: 300 | 30 days supply | Qty: 60 | Fill #0

## 2018-05-18 MED FILL — LISINOPRIL 5 MG TAB: 5 | 30 days supply | Qty: 15 | Fill #3

## 2018-05-18 NOTE — Care Management Note (Signed)
Case Management Note Marvetta Gibbons RN, BSN Unit 4E- RN Care Coordinator  [phone]  Patient Details  Name: Terri Sparks MRN: [id number] Date of Birth: October 16, 1975  Subjective/Objective:  Pt admitted with ischemic leg, s/p thrombectomy with angioplasty                   Action/Plan: PTA pt lived at home, to stay with her daughter at discharge. PT recommending outpt PT- pt is uninsured- would be out of pocket expense- CM will speak with pt about this and see if she wants referral or not. At time that CM went to see pt she had just taken some meds and asked CM to come back in am, pt did ask for rollator which CM can not provide under charity- can get pt a RW which she states she wants. Pt is followed at [facility]. Was last MATCHed for medication assistance 6/19- and is not eligible for assistance at this time. CM will f/u on outpt PT needs in the am 8/15  Expected Discharge Date:  05/19/18               Expected Discharge Plan:  OP Rehab  In-House Referral:     Discharge planning Services  CM Consult, Medication Assistance  Post Acute Care Choice:  Durable Medical Equipment Choice offered to:  Patient  DME Arranged:  [facility] rolling DME Agency:  [place]:    [facility] Agency:     Status of Service:  In process, will continue to follow  If discussed at Long Length of Stay Meetings, dates discussed:    Discharge Disposition: home/self care   Additional Comments:  05/18/18- 1000- Marvetta Gibbons RN, CM- f/u done with pt at bedside regarding transition- per pt she states that she is going to her daughter's home in [place] (she does not want her son to know she is going there)- pt states that her daughter had to get permission for her to stay with her as she is in subsidized housing- the approval starts on Aug. 16- pt reports that her daughter can provide transportation on the 16. CM has informed pt that if transportation is an issue - CSW can assist with  transportation to daughter's- pt is to provide daughter's address to CM in the am once she confirms it with her daughter this evening. Pt also now states she would rather have cane than RW- can due either one under charity (but not both)- pt has selected to go with cane- will have [facility] cane prior to discharge. Discussed outpt PT with pt and cost- pt wants to discuss with son before making decision- CM will f/u in am- if pt decides on outpt PT- will make appropriate referral (spoke with PT-Jaclyn who states pt will be fine without outpt PT- with just walking at home). CM reviewed medications- pt has already spoken with [facility] pharmacy regarding cost- faxed scripts (no pain med) to [facility] to expedite filling of medications and so that pt could find out what her cost for meds will be.   Dawayne Patricia, RN 05/18/2018, 3:31 PM

## 2018-05-18 NOTE — Progress Notes (Signed)
ANTICOAGULATION CONSULT NOTE - Follow Up Consult  Pharmacy Consult for Heparin and Coumadin Indication: ischemic leg, arterial thromboembolus  Allergies  Allergen Reactions  . Ciprofloxacin Hcl Hives  . Macrobid [Nitrofurantoin Macrocrystal] Hives, Shortness Of Breath and Rash  . Other Hives, Shortness Of Breath and Rash    NO "-CILLINS"!!!  . Penicillins Shortness Of Breath    Had had cephalosporins without incident  . Sulfa Antibiotics Hives, Shortness Of Breath and Rash  . Lexapro [Escitalopram Oxalate]     I just did not like it.   Patient Measurements: Height: 5\' 7"  (170.2 cm) Weight: 278 lb 9.6 oz (126.4 kg) IBW/kg (Calculated) : 61.6 Heparin Dosing Weight: 93 kg  Vital Signs: Temp: 98.6 F (37 C) (08/15 0755) Temp Source: Oral (08/15 0755) BP: 140/71 (08/15 0755) Pulse Rate: 100 (08/15 0755)  Labs: Recent Labs    05/16/18 0343 05/17/18 0258 05/17/18 1014 05/18/18 0328  HGB 8.8* 8.7*  --  9.3*  HCT 28.9* 28.3*  --  31.3*  PLT 388 395  --  384  LABPROT 17.1* 20.1*  --  23.3*  INR 1.40 1.72  --  2.09  HEPARINUNFRC 0.33 0.25* 0.25* 0.38  CREATININE 1.55*  --   --   --    Estimated Creatinine Clearance: 64.6 mL/min (A) (by C-G formula based on SCr of 1.55 mg/dL (H)).  Assessment: 42 yo F presented to the ED on 8/9 with leg pain. She was prescribed coumadin at 10 mg T/Th/Sat and 5 mg all other days, but stopped taking. INR 1.01 on admit. Now on heparin and warfarin for ischemic leg and arterial thromboembolism, s/p thrombectomy and angioplasty on 8/9. Megace added 8/10 for abnormal uterine bleeding.  Heparin level therapeutic.  INR trending up to 2.09 - now therapeutic. CBC stable. Bleeding reported to have stopped.  Goal of Therapy:  INR 2-3 Heparin level 0.3-0.7 units/ml Monitor platelets by anticoagulation protocol: Yes   Plan:  Continue IV heparin drip at 2200 units/hr - d/c when INR therapeutic >24hrs Coumadin 12.5 mg PO x 1 again tonight - likely will  need to reduce dose tomorrow Monitor daily heparin level/INR/CBC, s/sx bleeding  Elicia Lamp, PharmD, BCPS Clinical Pharmacist 05/18/2018 10:06 AM

## 2018-05-18 NOTE — Progress Notes (Signed)
Inpatient Diabetes Program Recommendations  AACE/ADA: New Consensus Statement on Inpatient Glycemic Control (2015)  Target Ranges:  Prepandial:   less than 140 mg/dL      Peak postprandial:   less than 180 mg/dL (1-2 hours)      Critically ill patients:  140 - 180 mg/dL   Lab Results  Component Value Date   GLUCAP 262 (H) 05/18/2018   HGBA1C 10.3 01/27/2018    Review of Glycemic Control  Diabetes history:DM2 Outpatient Diabetes medications:Lantus 70 units + Novolog 5-15 units tid Current orders for Inpatient glycemic control:Lantus 35 units + Novolog moderate scale correction tid  Inpatient Diabetes Program Recommendations: Noted hyperglycemia -Increase Lantus to 50 units daily -Add Novolog 4 units tid meal coverage if eats 50% -Add Novolog hs 0-5 units  -A1c to determine glycemic   Thank you, Bethena Roys E. Justin Meisenheimer, RN, MSN, CDE  Diabetes Coordinator Inpatient Glycemic Control Team Team Pager [phone] (8am-5pm) 05/18/2018 12:27 PM

## 2018-05-18 NOTE — Progress Notes (Addendum)
     Subjective  - Doing better over all.   Objective 132/63 (!) 101 98.7 F (37.1 C) (Oral) 20 100%  Intake/Output Summary (Last 24 hours) at 05/18/2018 0731 Last data filed at 05/18/2018 0400 Gross per 24 hour  Intake 3464.84 ml  Output -  Net 3464.84 ml    Doppler left DP, right DP/PT Left groin superficiale wound healing without change, dry dressing in place Right groin stick site without hematoma Heart RRR Lungs non labored breathing  Assessment/Planning: POD # 6 1. Ultrasound-guided access, right femoral artery 2. Abdominal aortogram 3. Left leg runoff 4. Mechanical thrombectomy using thePenumbradevice of the left common iliac, left external iliac, left common femoral, left profundofemoral, left superficial femoral, left popliteal, left anterior tibial, and left peroneal artery 5. Balloon angioplasty, left anterior tibial artery 6. Intra-arterial administration of TPA and nitroglycerin 7. Closure device (Proglide)   INR 2.09 today has out patient follow up with PCP for INR checks Uninsured no out patient PT available, rolling walker provided Hematology f/u will be scheduled by our office Plan for discharge tomorrow, her daughter can't get her until then.   Roxy Horseman 05/18/2018 7:31 AM --  Laboratory Lab Results: Recent Labs    05/17/18 0258 05/18/18 0328  WBC 11.2* 9.7  HGB 8.7* 9.3*  HCT 28.3* 31.3*  PLT 395 384   BMET Recent Labs    05/16/18 0343  NA 138  K 4.4  CL 98  CO2 28  GLUCOSE 280*  BUN 13  CREATININE 1.55*  CALCIUM 9.9    COAG Lab Results  Component Value Date   INR 2.09 05/18/2018   INR 1.72 05/17/2018   INR 1.40 05/16/2018   No results found for: PTT   I have independently interviewed and examined patient and agree with PA assessment and plan above.  She has strong signal in her left foot her left groin wound is healing well and her right groin is without hematoma.  Disposition will be home  when she has transportation available.  Madoline Bhatt C. Donzetta Matters, MD [facility] [place] Office: [phone] Pager: [phone]

## 2018-05-19 ENCOUNTER — Other Ambulatory Visit: Payer: Self-pay

## 2018-05-19 ENCOUNTER — Telehealth: Payer: Self-pay

## 2018-05-19 ENCOUNTER — Telehealth: Payer: Self-pay | Admitting: *Deleted

## 2018-05-19 ENCOUNTER — Telehealth: Payer: Self-pay | Admitting: Pharmacist

## 2018-05-19 DIAGNOSIS — E1165 Type 2 diabetes mellitus with hyperglycemia: Principal | ICD-10-CM

## 2018-05-19 DIAGNOSIS — Z794 Long term (current) use of insulin: Principal | ICD-10-CM

## 2018-05-19 DIAGNOSIS — I739 Peripheral vascular disease, unspecified: Secondary | ICD-10-CM

## 2018-05-19 DIAGNOSIS — IMO0001 Reserved for inherently not codable concepts without codable children: Secondary | ICD-10-CM

## 2018-05-19 LAB — GLUCOSE, CAPILLARY
Glucose-Capillary: 172 mg/dL — ABNORMAL HIGH (ref 70–99)
Glucose-Capillary: 208 mg/dL — ABNORMAL HIGH (ref 70–99)

## 2018-05-19 LAB — PROTIME-INR
INR: 2.38
Prothrombin Time: 25.8 seconds — ABNORMAL HIGH (ref 11.4–15.2)

## 2018-05-19 LAB — CBC
HCT: 31.5 % — ABNORMAL LOW (ref 36.0–46.0)
Hemoglobin: 9.3 g/dL — ABNORMAL LOW (ref 12.0–15.0)
MCH: 27.4 pg (ref 26.0–34.0)
MCHC: 29.5 g/dL — ABNORMAL LOW (ref 30.0–36.0)
MCV: 92.6 fL (ref 78.0–100.0)
Platelets: 391 10*3/uL (ref 150–400)
RBC: 3.4 MIL/uL — ABNORMAL LOW (ref 3.87–5.11)
RDW: 17.7 % — ABNORMAL HIGH (ref 11.5–15.5)
WBC: 10.2 10*3/uL (ref 4.0–10.5)

## 2018-05-19 LAB — HEPARIN LEVEL (UNFRACTIONATED): Heparin Unfractionated: 0.36 IU/mL (ref 0.30–0.70)

## 2018-05-19 MED ORDER — WARFARIN SODIUM 10 MG PO TABS
10.0000 mg | ORAL_TABLET | Freq: Once | ORAL | Status: AC
  Administered 2018-05-19: 10 mg via ORAL
  Filled 2018-05-19: qty 1

## 2018-05-19 MED ORDER — INSULIN ASPART 100 UNIT/ML ~~LOC~~ SOLN: SUBCUTANEOUS | 0 refills | Status: DC

## 2018-05-19 MED FILL — !NOVOLOG 100UNITS/ML VIAL: 100/ML | 22 days supply | Qty: 10 | Fill #0

## 2018-05-19 MED FILL — TRUE METRIX TEST STRIP: 25 days supply | Qty: 100 | Fill #3

## 2018-05-19 NOTE — Progress Notes (Signed)
Patient in a stable condition, discharge education reviewed with patient she verbalized understanding, tele dc ccmd notified, prescription given to patient, patient belongings at bedside, patient awaiting her family for transportation home.

## 2018-05-19 NOTE — Telephone Encounter (Signed)
Pt discharged from hospital today. She called me requesting to make an INR appointment Monday (05/22/18). Will have her see Erline Levine as I'm out of town that day.

## 2018-05-19 NOTE — Care Management Note (Signed)
Case Management Note Marvetta Gibbons RN, BSN Unit 4E- RN Care Coordinator  [phone]  Patient Details  Name: Terri Sparks MRN: [id number] Date of Birth: 02-11-1976  Subjective/Objective:  Pt admitted with ischemic leg, s/p thrombectomy with angioplasty                   Action/Plan: PTA pt lived at home, to stay with her daughter at discharge. PT recommending outpt PT- pt is uninsured- would be out of pocket expense- CM will speak with pt about this and see if she wants referral or not. At time that CM went to see pt she had just taken some meds and asked CM to come back in am, pt did ask for rollator which CM can not provide under charity- can get pt a RW which she states she wants. Pt is followed at [facility]. Was last MATCHed for medication assistance 6/19- and is not eligible for assistance at this time. CM will f/u on outpt PT needs in the am 8/15  Expected Discharge Date:  05/19/18               Expected Discharge Plan:  OP Rehab  In-House Referral:     Discharge planning Services  CM Consult, Medication Assistance  Post Acute Care Choice:  Durable Medical Equipment Choice offered to:  Patient  DME Arranged:  Kasandra Knudsen DME Agency:  [place]:    [place]:     Status of Service:  Completed, signed off  If discussed at [place] of Stay Meetings, dates discussed:    Discharge Disposition: home/self care   Additional Comments:  05/19/18- 1000- Marvetta Gibbons RN, CM- pt for discharge today- have confirmed with pt that her daughter will be able to provide transportation. Pt aware that if something changes she is to let CM know so that transportation can be arrange. Have notified Jermaine with [facility] for cane to be delivered to room for discharge. Per pt she will be staying at [facility] (address in [place]) for tonight which is her aunt's address- then with her daughter so that she can use bus route to get to her appointments- pt still unable to  provide daughter's address.   05/18/18- 1000- Kenyona Rena RN, CM- f/u done with pt at bedside regarding transition- per pt she states that she is going to her daughter's home in [place] (she does not want her son to know she is going there)- pt states that her daughter had to get permission for her to stay with her as she is in subsidized housing- the approval starts on Aug. 16- pt reports that her daughter can provide transportation on the 16. CM has informed pt that if transportation is an issue - CSW can assist with transportation to daughter's- pt is to provide daughter's address to CM in the am once she confirms it with her daughter this evening. Pt also now states she would rather have cane than RW- can due either one under charity (but not both)- pt has selected to go with cane- will have [facility] deliver cane prior to discharge. Discussed outpt PT with pt and cost- pt wants to discuss with son before making decision- CM will f/u in am- if pt decides on outpt PT- will make appropriate referral (spoke with PT-Jaclyn who states pt will be fine without outpt PT- with just walking at home). CM reviewed medications- pt has already spoken with [facility] pharmacy regarding cost- faxed scripts (no  pain med) to [facility] to expedite filling of medications and so that pt could find out what her cost for meds will be.   Dawayne Patricia, RN 05/19/2018, 10:05 AM

## 2018-05-19 NOTE — Telephone Encounter (Signed)
Sched. Appt, spoke with pt. 9/19/19mon. 4pm p/o PA (new appt.) VWB 2/3wks ABI p/o PA s/p thrombectomy LLE

## 2018-05-19 NOTE — Progress Notes (Addendum)
ANTICOAGULATION CONSULT NOTE - Follow Up Consult  Pharmacy Consult for Coumadin Indication: ischemic leg, arterial thromboembolus  Allergies  Allergen Reactions  . Ciprofloxacin Hcl Hives  . Macrobid [Nitrofurantoin Macrocrystal] Hives, Shortness Of Breath and Rash  . Other Hives, Shortness Of Breath and Rash    NO "-CILLINS"!!!  . Penicillins Shortness Of Breath    Had had cephalosporins without incident  . Sulfa Antibiotics Hives, Shortness Of Breath and Rash  . Lexapro [Escitalopram Oxalate]     I just did not like it.   Patient Measurements: Height: 5\' 7"  (170.2 cm) Weight: 277 lb 12.5 oz (126 kg) IBW/kg (Calculated) : 61.6 Heparin Dosing Weight: 93 kg  Vital Signs: Temp: 98.6 F (37 C) (08/16 0740) Temp Source: Oral (08/16 0740) BP: 105/52 (08/16 0740) Pulse Rate: 99 (08/16 0740)  Labs: Recent Labs    05/17/18 0258 05/17/18 1014 05/18/18 0328 05/19/18 0308  HGB 8.7*  --  9.3* 9.3*  HCT 28.3*  --  31.3* 31.5*  PLT 395  --  384 391  LABPROT 20.1*  --  23.3* 25.8*  INR 1.72  --  2.09 2.38  HEPARINUNFRC 0.25* 0.25* 0.38 0.36   Estimated Creatinine Clearance: 64.6 mL/min (A) (by C-G formula based on SCr of 1.55 mg/dL (H)).  Assessment: 42 yo F presented to the ED on 8/9 with leg pain. She was prescribed coumadin at 10 mg T/Th/Sat and 5 mg all other days, but stopped taking. INR 1.01 on admit. Now on heparin and warfarin for ischemic leg and arterial thromboembolism, s/p thrombectomy and angioplasty on 8/9. Megace added 8/10 for abnormal uterine bleeding.  Heparin level therapeutic this morning at 0.36, on 2200 units/hr. Hgb 9.3, plt 391. INR trending up from 2.09 to 2.38 - now therapeutic. CBC stable. No s/sx of bleeding. No new medication interactions.  Goal of Therapy:  INR 2-3 Monitor platelets by anticoagulation protocol: Yes   Plan:  Discontinue IV heparin infusion since INR therapeutic >24hrs Coumadin 10 mg PO x 1 tonight Monitor INR/CBC, s/sx  bleeding  Consider discharge regimen of PTA dosing of 5 mg daily except 10 mg T/Th/Sat with close INR follow-up on Monday  Doylene Canard, PharmD Clinical Pharmacist  Pager: [phone] Phone: [phone] 05/19/2018 7:56 AM

## 2018-05-19 NOTE — Progress Notes (Signed)
  Progress Note    05/19/2018 7:08 AM 7 Days Post-Op  Subjective: Generally doing well  Vitals:   05/18/18 2351 05/19/18 0334  BP: (!) 118/49 128/68  Pulse:  100  Resp:  (!) 23  Temp: 97.8 F (36.6 C) 98.5 F (36.9 C)  SpO2:  100%    Physical Exam: Awake alert oriented Left groin is healing well There is a strong signal distally in her left foot and it is warm and appears well perfused  CBC    Component Value Date/Time   WBC 10.2 05/19/2018 0308   RBC 3.40 (L) 05/19/2018 0308   HGB 9.3 (L) 05/19/2018 0308   HGB 11.0 (L) 01/27/2018 1157   HCT 31.5 (L) 05/19/2018 0308   HCT 37.6 01/27/2018 1157   PLT 391 05/19/2018 0308   PLT 490 (H) 01/27/2018 1157   MCV 92.6 05/19/2018 0308   MCV 78 (L) 01/27/2018 1157   MCH 27.4 05/19/2018 0308   MCHC 29.5 (L) 05/19/2018 0308   RDW 17.7 (H) 05/19/2018 0308   RDW 17.1 (H) 01/27/2018 1157   LYMPHSABS 2.6 05/12/2018 0613   MONOABS 0.8 05/12/2018 0613   EOSABS 0.2 05/12/2018 0613   BASOSABS 0.1 05/12/2018 0613    BMET    Component Value Date/Time   NA 138 05/16/2018 0343   NA 134 01/27/2018 1157   K 4.4 05/16/2018 0343   CL 98 05/16/2018 0343   CO2 28 05/16/2018 0343   GLUCOSE 280 (H) 05/16/2018 0343   BUN 13 05/16/2018 0343   BUN 14 01/27/2018 1157   CREATININE 1.55 (H) 05/16/2018 0343   CALCIUM 9.9 05/16/2018 0343   GFRNONAA 40 (L) 05/16/2018 0343   GFRAA 46 (L) 05/16/2018 0343    INR    Component Value Date/Time   INR 2.38 05/19/2018 0308     Intake/Output Summary (Last 24 hours) at 05/19/2018 0708 Last data filed at 05/19/2018 0100 Gross per 24 hour  Intake 1300.22 ml  Output -  Net 1300.22 ml     Assessment:  Terri Sparks is status post endovascular left lower extremity embolectomy.  Plan: Okay for discharge with close INR follow-up We will follow-up with OB/GYN as outpatient Follow-up in 4 to 6 weeks with me with left lower extremity duplex and ABIs She will be on Coumadin and aspirin as long  she does not have other bleeding issues.   Tyger Wichman C. Donzetta Matters, MD [facility] [place] Office: [phone] Pager: [phone]  05/19/2018 7:08 AM

## 2018-05-19 NOTE — Telephone Encounter (Signed)
PA from [facility] called requesting an INR appt. She was informed that the pharmacist was going to be out next week and she stated that she was aware but that the pharmacist was going to have someone else see the patient. Please f/u with patient at [phone]

## 2018-05-19 NOTE — Progress Notes (Signed)
Pt stated that she has multiple bowel movements ( 9 times in the past 24 hours). Pt stated that bowel movement is mostly liquid since March 19, 2018.  Kennyth Lose, RN

## 2018-05-22 ENCOUNTER — Ambulatory Visit: Payer: PRIVATE HEALTH INSURANCE | Attending: Family Medicine | Admitting: Pharmacist

## 2018-05-22 DIAGNOSIS — I749 Embolism and thrombosis of unspecified artery: Secondary | ICD-10-CM

## 2018-05-22 DIAGNOSIS — Z7901 Long term (current) use of anticoagulants: Secondary | ICD-10-CM | POA: Insufficient documentation

## 2018-05-22 DIAGNOSIS — Z5181 Encounter for therapeutic drug level monitoring: Secondary | ICD-10-CM | POA: Insufficient documentation

## 2018-05-22 DIAGNOSIS — I745 Embolism and thrombosis of iliac artery: Secondary | ICD-10-CM

## 2018-05-22 LAB — POCT INR: INR: 4.7 — AB (ref 2.0–3.0)

## 2018-05-22 NOTE — Progress Notes (Signed)
    Pharmacy Anticoagulation Clinic  Subjective: Patient presents today for INR monitoring. Anticoagulation indication is thromboembolism.   Current dose of warfarin: 5 mg daily, except 10 mg on T/T/Sat  Adherence to warfarin: may have taken 3 tablets yesterday. She was only supposed to take 1 tablet. Signs/symptoms of bleeding: still has some vaginal bleeding but Megace has helped. Recent changes in diet: denies Recent changes in medications: denies Upcoming procedures that may impact anticoagulation: none   Objective: Today's INR = 4.7  Lab Results  Component Value Date   INR 4.7 (A) 05/22/2018   INR 2.38 05/19/2018   INR 2.09 05/18/2018     Assessment and Plan: Anticoagulation: Patient is Supratherapeutic based on patient's INR of 4.7 and patient's INR goal of 2-3. Will hold current warfarin dose: she has already taken today's dose so will hold tomorrow and Wednesday. Patient cannot return this week due to transportation. Will restart current dose on Thursday - may need a dose decrease but hard to tell with her taking 2 extra tablets yesterday. Reviewed dose with patient that she is only supposed to take 10 mg on T/T/Sat and no days does she take 3 tablets. Patient verbalized understanding. Patient to return on Monday. Instructed her to call us or call 911/go to emergency room if she has any s/sx of bleeding or increased vaginal bleeding.   Patient verbalized understanding and was provided with written instructions. Next INR check planned for Thursday but patient cannot come again this week so will return next Monday.

## 2018-05-22 NOTE — Discharge Summary (Signed)
[facility] Discharge Summary   Patient ID:  Terri Sparks MRN: [id number] DOB/AGE: 02/04/1976 42 y.o.  Admit date: 05/12/2018 Discharge date: 05/19/2018 Date of Surgery: 05/12/2018 Surgeon: Surgeon(s): Serafina Mitchell, MD Marty Heck, MD  Admission Diagnosis: Ischemia of extremity [I99.8]  Discharge Diagnoses:  Ischemia of extremity [I99.8]  Secondary Diagnoses: Past Medical History:  Diagnosis Date  . Anxiety   . Diabetes ([place])   . GERD (gastroesophageal reflux disease)   . History of blood clots   . Hypercalcemia   . Hyperparathyroidism   . Nephrolithiasis   . Renal calculi     Procedure(s): MECHANICAL THROMBECTOMY LEFT LEG, BALLOON ANGIOPLASTY LEFT ANTERIOR TIBIAL ARTERY, AORTOGRAM WITH LEFT LEG RUNOFF  Discharged Condition: stable  HPI: History of Present Illness: This is a 42 y.o. female who is s/p prior L leg TE and L CIA stentingwho presents with chief complaint:recurrent left leg pain with skin changes. Her sx were similar to her presentation in May 2018. 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 Bridgett Larsson.    Her post operative course was complicated by left groin infection that required debridement in the OR and required a wound vac.  She states that this was removed in mid July and has essentially healed with the exception of a small fissure area.    In mid July, she was having problems with heavy vaginal bleeding and nausea.  She was on coumadin for her arterial thrombosis.  She subsequently stopped taking her coumadin.  Her INR today is 1.0.  She has had a GI illness and has not taken any of her  medication.   She presents today with severe pain in her left leg.  She underwent a CT scan that reveals reocclusion of her left iliac artery.   She was taken to surgery by DR. Brabham emergently.    Hospital Course:  Terri Sparks is a 42 y.o. female is S/P Procedure(s): MECHANICAL THROMBECTOMY LEFT LEG, BALLOON ANGIOPLASTY LEFT ANTERIOR TIBIAL ARTERY, AORTOGRAM WITH LEFT LEG RUNOFF  Consults:  Treatment Team:  Serafina Mitchell, MD Truett Mainland, DO Waynetta Sandy, MD   Post op she regained doppler signals in the left PT/peroneal and stated her left foot felt much better.  She was anticoagulated while in the hospital on Heparin with a plan to restart her coumadin.    CT scan revealed incidental finding of enlarging right adnexal/ovarian soft tissue lesion and is not a simple cyst.  Will need referral to GYN.  She does not have a GYN MD. -nystatin powder for left groin.  Discussed with her to keep groin dry.    Psot op anemia 6.0 she was transfused 2 units PRBC 05/14/2018.   ABI results performed 05/14/2018  Throughout her admission she continued to have nausea with occasional vomiting.  This was managed with phenergan and Zofran.   Post op day # 6 INR 2.09 today has out patient follow up with PCP for INR checks Uninsured no out patient PT available, rolling walker provided Hematology f/u will be scheduled by our office We will follow-up with OB/GYN as outpatient She will be on Coumadin and aspirin as long she does not have other bleeding issues.  Discharged in stable condition with her  daughter.  Her   RIGHT LEFT  ABI / TBI 1.2 / 0.95 0.75 / 0      RIGHT LEFT  ABI / TBI 1.2 / 0.95 0.75 / 0     Significant Diagnostic Studies: CBC Lab Results  Component Value Date   WBC 10.2 05/19/2018   HGB 9.3 (L) 05/19/2018   HCT 31.5 (L) 05/19/2018   MCV 92.6 05/19/2018   PLT 391 05/19/2018    BMET    Component Value Date/Time   NA 138 05/16/2018 0343   NA  134 01/27/2018 1157   K 4.4 05/16/2018 0343   CL 98 05/16/2018 0343   CO2 28 05/16/2018 0343   GLUCOSE 280 (H) 05/16/2018 0343   BUN 13 05/16/2018 0343   BUN 14 01/27/2018 1157   CREATININE 1.55 (H) 05/16/2018 0343   CALCIUM 9.9 05/16/2018 0343   GFRNONAA 40 (L) 05/16/2018 0343   GFRAA 46 (L) 05/16/2018 0343   COAG Lab Results  Component Value Date   INR 4.7 (A) 05/22/2018   INR 2.38 05/19/2018   INR 2.09 05/18/2018     Disposition:  Discharge to :Home Discharge Instructions    Call MD for:  redness, tenderness, or signs of infection (pain, swelling, bleeding, redness, odor or green/yellow discharge around incision site)   Complete by:  As directed    Call MD for:  redness, tenderness, or signs of infection (pain, swelling, bleeding, redness, odor or green/yellow discharge around incision site)   Complete by:  As directed    Call MD for:  severe or increased pain, loss or decreased feeling  in affected limb(s)   Complete by:  As directed    Call MD for:  severe or increased pain, loss or decreased feeling  in affected limb(s)   Complete by:  As directed    Call MD for:  temperature >100.5   Complete by:  As directed    Call MD for:  temperature >100.5   Complete by:  As directed    Discharge instructions   Complete by:  As directed    Dry gauze to left groin to help keep dry and wick moisture daily and as needed.  Does not need to be taped in place.  Replace daily and prn.  Nystatin powder PRN B groins.   Resume previous diet   Complete by:  As directed    Resume previous diet   Complete by:  As directed      Allergies as of 05/19/2018      Reactions   Ciprofloxacin Hcl Hives   Macrobid [nitrofurantoin Macrocrystal] Hives, Shortness Of Breath, Rash   Other Hives, Shortness Of Breath, Rash   NO "-CILLINS"!!!   Penicillins Shortness Of Breath   Had had cephalosporins without incident   Sulfa Antibiotics Hives, Shortness Of Breath, Rash   Lexapro [escitalopram  Oxalate]    I just did not like it.      Medication List    STOP taking these medications   clopidogrel 75 MG tablet Commonly known as:  PLAVIX   traMADol 50 MG tablet Commonly known as:  ULTRAM     TAKE these medications   acetaminophen 500 MG tablet Commonly known as:  TYLENOL Take 2,000 mg by mouth every 6 (six) hours as needed.   aspirin EC 81 MG tablet Take 81 mg by mouth daily.   blood glucose meter kit and supplies Dispense based on patient and insurance preference. Use up to four times daily as directed. (  FOR ICD-9 250.00, 250.01).   buPROPion 150 MG 12 hr tablet Commonly known as:  ZYBAN Take 1 tablet (150 mg total) by mouth 2 (two) times daily.   diphenhydrAMINE 25 MG tablet Commonly known as:  BENADRYL Take 25 mg by mouth every 6 (six) hours as needed.   ferrous sulfate 325 (65 FE) MG tablet Take 1 tablet (325 mg total) by mouth daily with breakfast.   gabapentin 300 MG capsule Commonly known as:  NEURONTIN TAKE 1 CAPSULE BY MOUTH 2 TIMES DAILY. What changed:  See the new instructions.   glucose blood test strip Use as instructed   glucose blood test strip Use as instructed   hydrOXYzine 25 MG tablet Commonly known as:  ATARAX/VISTARIL 1 tab PO Q a.m, 1 Q noon and 2 Q p.m PRN What changed:    how much to take  how to take this  when to take this   insulin glargine 100 UNIT/ML injection Commonly known as:  LANTUS Inject 0.77 mLs (77 Units total) into the skin daily. What changed:  how much to take   Insulin Pen Needle 31G X 5 MM Misc Use with Lantus and Novolog injections.   Insulin Syringe-Needle U-100 31G X 5/16" 0.5 ML Misc Use as directed   liraglutide 18 MG/3ML Sopn Commonly known as:  VICTOZA Inject 0.1 mLs (0.6 mg total) into the skin daily.   lisinopril 5 MG tablet Commonly known as:  PRINIVIL,ZESTRIL Take 0.5 tablets (2.5 mg total) by mouth daily.   megestrol 40 MG tablet Commonly known as:  MEGACE Take 2 tablets (80 mg  total) by mouth 2 (two) times daily.   nicotine 14 mg/24hr patch Commonly known as:  NICODERM CQ - dosed in mg/24 hours Place 1 patch (14 mg total) onto the skin daily.   nystatin 100000 UNIT/ML suspension Commonly known as:  MYCOSTATIN Take 5 mLs (500,000 Units total) by mouth 4 (four) times daily.   omeprazole 20 MG capsule Commonly known as:  PRILOSEC Take 2 capsules (40 mg total) by mouth daily. What changed:  how much to take   ondansetron 4 MG tablet Commonly known as:  ZOFRAN Take 1 tablet (4 mg total) by mouth every 8 (eight) hours as needed for nausea or vomiting.   oxyCODONE-acetaminophen 5-325 MG tablet Commonly known as:  PERCOCET/ROXICET Take 1 tablet by mouth every 4 (four) hours as needed for moderate pain.   TRUE METRIX METER w/Device Kit Use as directed   TRUEPLUS LANCETS 28G Misc Use as directed   warfarin 5 MG tablet Commonly known as:  COUMADIN Take as directed. If you are unsure how to take this medication, talk to your nurse or doctor. Original instructions:  Take 1 tablet (5 mg total) by mouth See admin instructions. 5 mg Sun, Mon , wed,  Fri 10 mg true, Thurs sat     ASK your doctor about these medications   insulin aspart 100 UNIT/ML injection Commonly known as:  novoLOG 5-15 units subcut TID with meals Ask about: Which instructions should I use?      Verbal and written Discharge instructions given to the patient. Wound care per Discharge [facility] [place] [facility]. Schedule an appointment as soon as possible for a visit in 3 week(s).   Specialty:  Obstetrics and Gynecology Why:  For abnormal uterine bleeding Contact information: [place] [phone]       Ladell Pier, MD Follow up  on 05/22/2018.   Specialty:  Internal Medicine Why:  to be determined. Contact information: [place] [address] [phone]        Waynetta Sandy, MD Follow up in 4 week(s).   Specialties:  Vascular Surgery, Cardiology Why:  office will call Contact information: [place] [address] [phone]           Signed: Roxy Horseman 05/22/2018, 11:49 AM

## 2018-05-24 ENCOUNTER — Other Ambulatory Visit: Payer: Self-pay

## 2018-05-24 ENCOUNTER — Ambulatory Visit (INDEPENDENT_AMBULATORY_CARE_PROVIDER_SITE_OTHER): Payer: Self-pay | Admitting: Physician Assistant

## 2018-05-24 VITALS — BP 113/66 | HR 113 | Temp 98.1°F | Resp 18 | Ht 67.0 in | Wt 280.0 lb

## 2018-05-24 DIAGNOSIS — I739 Peripheral vascular disease, unspecified: Secondary | ICD-10-CM

## 2018-05-24 MED ORDER — OXYCODONE-ACETAMINOPHEN 5-325 MG PO TABS: 1.0000 | ORAL_TABLET | Freq: Three times a day (TID) | ORAL | 0 refills | Status: DC | PRN

## 2018-05-24 NOTE — Progress Notes (Signed)
POST OPERATIVE OFFICE NOTE    CC:  F/u for surgery  HPI: HPI: History of Present Illness: This is a42 y.o.femalewho iss/p prior L leg TE and L CIA stentingwho presents with chief complaint:recurrent left leg pain with skin changes. 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 Bridgett Larsson.    She presented to [facility] ED with severe pain in her left leg. She underwent a CT scan that reveals reocclusion of her left iliac artery.  She was taken to surgery by DR. Brabham emergently.   S/P Procedure Performed:             1.  Ultrasound-guided access, right femoral artery             2.  Abdominal aortogram             3.  Left leg runoff             4.  Mechanical thrombectomy using the Penumbra device of the left common iliac, left external iliac, left common femoral, left profundofemoral, left superficial femoral, left popliteal, left anterior tibial, and left peroneal artery             5.  Balloon angioplasty, left anterior tibial artery             6.  Intra-arterial administration of TPA and nitroglycerin             7.  Closure device (Proglide)  She was discharged on Coumadin and aspirin daily.  She will be on permanent anticoagulation for the rest of her life.  She is here today with a complaint of increased pain in the left LE.  She has been taking 4 oxycodone's a day for pain control and she is now out of pain medication.    She denise fever and chills.  He last INR was 4 and her physician asked her to hold a dose and f/u tomorrow for re check.    Allergies  Allergen Reactions  . Ciprofloxacin Hcl Hives  .  Macrobid [Nitrofurantoin Macrocrystal] Hives, Shortness Of Breath and Rash  . Other Hives, Shortness Of Breath and Rash    NO "-CILLINS"!!!  . Penicillins Shortness Of Breath    Had had cephalosporins without incident  . Sulfa Antibiotics Hives, Shortness Of Breath and Rash  . Lexapro [Escitalopram Oxalate]     I just did not like it.    Current Outpatient Medications  Medication Sig Dispense Refill  . acetaminophen (TYLENOL) 500 MG tablet Take 2,000 mg by mouth every 6 (six) hours as needed.    Marland Kitchen aspirin EC 81 MG tablet Take 81 mg by mouth daily.    . blood glucose meter kit and supplies Dispense based on patient and insurance preference. Use up to four times daily as directed. (FOR ICD-9 250.00, 250.01). 1 each 0  . Blood Glucose Monitoring Suppl (TRUE METRIX METER) w/Device KIT Use as directed 1 kit 0  . buPROPion (BUPROBAN) 150 MG 12 hr tablet Take 1 tablet (150 mg total) by mouth 2 (two) times daily. 60 tablet 2  . diphenhydrAMINE (BENADRYL) 25 MG tablet Take 25 mg by mouth every 6 (six) hours as needed.    . ferrous sulfate 325 (65  FE) MG tablet Take 1 tablet (325 mg total) by mouth daily with breakfast. 100 tablet 1  . gabapentin (NEURONTIN) 300 MG capsule TAKE 1 CAPSULE BY MOUTH 2 TIMES DAILY. (Patient taking differently: Take 300 mg by mouth 4 (four) times daily. ) 60 capsule 6  . glucose blood (TRUE METRIX BLOOD GLUCOSE TEST) test strip Use as instructed 100 each 12  . glucose blood test strip Use as instructed 100 each 0  . hydrOXYzine (ATARAX/VISTARIL) 25 MG tablet 1 tab PO Q a.m, 1 Q noon and 2 Q p.m PRN (Patient taking differently: Take 25 mg by mouth See admin instructions. 1 tab PO Q a.m, 1 Q noon and 2 Q p.m PRN) 120 tablet 2  . insulin aspart (NOVOLOG) 100 UNIT/ML injection 5-15 units subcut TID with meals 10 mL 0  . insulin glargine (LANTUS) 100 UNIT/ML injection Inject 0.77 mLs (77 Units total) into the skin daily. (Patient taking differently: Inject 70 Units into the skin  daily. ) 10 mL 11  . Insulin Pen Needle 31G X 5 MM MISC Use with Lantus and Novolog injections. 100 each 11  . Insulin Syringe-Needle U-100 (BD INSULIN SYRINGE ULTRAFINE) 31G X 5/16" 0.5 ML MISC Use as directed 100 each 3  . liraglutide (VICTOZA) 18 MG/3ML SOPN Inject 0.1 mLs (0.6 mg total) into the skin daily. 3 pen 3  . lisinopril (PRINIVIL,ZESTRIL) 5 MG tablet Take 0.5 tablets (2.5 mg total) by mouth daily. 45 tablet 3  . megestrol (MEGACE) 40 MG tablet Take 2 tablets (80 mg total) by mouth 2 (two) times daily. 60 tablet 0  . nicotine (NICODERM CQ - DOSED IN MG/24 HOURS) 14 mg/24hr patch Place 1 patch (14 mg total) onto the skin daily. 28 patch 1  . nystatin (MYCOSTATIN) 100000 UNIT/ML suspension Take 5 mLs (500,000 Units total) by mouth 4 (four) times daily. 60 mL 0  . omeprazole (PRILOSEC) 20 MG capsule Take 2 capsules (40 mg total) by mouth daily. (Patient taking differently: Take 20-40 mg by mouth daily. ) 60 capsule 3  . ondansetron (ZOFRAN) 4 MG tablet Take 1 tablet (4 mg total) by mouth every 8 (eight) hours as needed for nausea or vomiting. 20 tablet 0  . oxyCODONE-acetaminophen (PERCOCET/ROXICET) 5-325 MG tablet Take 1 tablet by mouth every 8 (eight) hours as needed for moderate pain. 30 tablet 0  . TRUEPLUS LANCETS 28G MISC Use as directed 100 each 6  . warfarin (COUMADIN) 5 MG tablet Take 1 tablet (5 mg total) by mouth See admin instructions. 5 mg Sun, Mon , wed,  Fri 10 mg true, Thurs sat 60 tablet 1   No current facility-administered medications for this visit.      ROS:  See HPI  Physical Exam:  Vitals:   05/24/18 1311  BP: 113/66  Pulse: (!) 113  Resp: 18  Temp: 98.1 F (36.7 C)  SpO2: 100%    Incision:  Left groin superficial incisional wound healing well, no sign of yeast infection in the groin crease.  Extremities:  Doppler AT peroneal, foot warm well perused with no skin discoloration Heart: RRR  Abdomen:  Soft + BS Lungs non labored breathing  Post o[p  ABI's  RIGHT LEFT  ABI/ TBI 1.2 / 0.95 0.75 / 0     Assessment/Plan:  This is a 42 y.o. female who is s/p:  1. Ultrasound-guided access, right femoral artery 2. Abdominal aortogram 3. Left leg runoff 4. Mechanical thrombectomy using thePenumbradevice of the left common iliac, left  external iliac, left common femoral, left profundofemoral, left superficial femoral, left popliteal, left anterior tibial, and left peroneal artery 5. Balloon angioplasty, left anterior tibial artery 6. Intra-arterial administration of TPA and nitroglycerin 7. Closure device (Proglide)  Patent left LE with doppler signals. She continues to have pain issues and has trouble sleeping at night. I refilled her Oxycodone for #30 tablets and told her she should decrease her intake to 3 per day and work on weaning off the narcotics.  She agreed to this plan.   Encourage mobility and elevation when at rest. Coumadin managed with PCP Pending Hematology referral F/U appoint scheduled    Roxy Horseman , PA-C [facility] [phone]

## 2018-05-29 ENCOUNTER — Ambulatory Visit: Payer: Self-pay | Attending: Internal Medicine | Admitting: Pharmacist

## 2018-05-29 ENCOUNTER — Other Ambulatory Visit: Payer: Self-pay

## 2018-05-29 DIAGNOSIS — I745 Embolism and thrombosis of iliac artery: Secondary | ICD-10-CM | POA: Insufficient documentation

## 2018-05-29 DIAGNOSIS — K219 Gastro-esophageal reflux disease without esophagitis: Secondary | ICD-10-CM

## 2018-05-29 DIAGNOSIS — I749 Embolism and thrombosis of unspecified artery: Secondary | ICD-10-CM | POA: Insufficient documentation

## 2018-05-29 LAB — POCT INR: INR: 2.1 (ref 2.0–3.0)

## 2018-05-29 MED ORDER — OMEPRAZOLE 20 MG PO CPDR: 40.0000 mg | DELAYED_RELEASE_CAPSULE | Freq: Every day | ORAL | 2 refills | Status: DC

## 2018-06-07 ENCOUNTER — Telehealth: Payer: Self-pay | Admitting: Internal Medicine

## 2018-06-07 NOTE — Telephone Encounter (Signed)
Pt doesn't ave ins and does not need a PA

## 2018-06-07 NOTE — Telephone Encounter (Signed)
Key: [id number]  Audrey called requesting status of PA for Novolog Pen

## 2018-06-08 ENCOUNTER — Other Ambulatory Visit: Payer: Self-pay | Admitting: Internal Medicine

## 2018-06-08 DIAGNOSIS — F419 Anxiety disorder, unspecified: Secondary | ICD-10-CM

## 2018-06-08 DIAGNOSIS — F329 Major depressive disorder, single episode, unspecified: Secondary | ICD-10-CM

## 2018-06-12 ENCOUNTER — Ambulatory Visit (HOSPITAL_COMMUNITY)
Admission: RE | Admit: 2018-06-12 | Discharge: 2018-06-12 | Disposition: A | Discharge status: Home / Self Care | Payer: Self-pay | Source: Ambulatory Visit | Attending: Surgery | Admitting: Surgery

## 2018-06-12 ENCOUNTER — Ambulatory Visit (INDEPENDENT_AMBULATORY_CARE_PROVIDER_SITE_OTHER): Payer: Self-pay | Admitting: Physician Assistant

## 2018-06-12 ENCOUNTER — Other Ambulatory Visit: Payer: Self-pay

## 2018-06-12 ENCOUNTER — Ambulatory Visit: Payer: Self-pay | Attending: Internal Medicine | Admitting: Pharmacist

## 2018-06-12 VITALS — BP 127/77 | HR 105 | Temp 97.8°F | Resp 16 | Ht 67.0 in | Wt 276.0 lb

## 2018-06-12 DIAGNOSIS — I749 Embolism and thrombosis of unspecified artery: Secondary | ICD-10-CM

## 2018-06-12 DIAGNOSIS — I739 Peripheral vascular disease, unspecified: Secondary | ICD-10-CM

## 2018-06-12 DIAGNOSIS — Z7901 Long term (current) use of anticoagulants: Secondary | ICD-10-CM | POA: Insufficient documentation

## 2018-06-12 DIAGNOSIS — Z86718 Personal history of other venous thrombosis and embolism: Secondary | ICD-10-CM | POA: Insufficient documentation

## 2018-06-12 DIAGNOSIS — I745 Embolism and thrombosis of iliac artery: Secondary | ICD-10-CM

## 2018-06-12 LAB — POCT INR: INR: 2.4 (ref 2.0–3.0)

## 2018-06-12 NOTE — Progress Notes (Signed)
POST OPERATIVE OFFICE NOTE    CC:  F/u for surgery  HPI:  This is a 42 y.o. female who has prior hx of prior L leg TE and L CIA stentingwho presents with chief complaint:recurrent left leg pain with skin changes. Her sx were similar to her presentation in May 2018. 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 Bridgett Larsson.    Her post operative course was complicated by left groin infection that required debridement in the OR and required a wound vac.  She states that this was removed in mid July and has essentially healed with the exception of a small fissure area.    In mid July, she was having problems with heavy vaginal bleeding and nausea.  She was on coumadin for her arterial thrombosis.  She subsequently stopped taking her coumadin.  Her INR today is 1.0.  She has had a GI illness and has not taken any of her medication.   On 05/12/18, she presented to the ER with severe left leg pain and underwent CTA that revealed re-occlusion of her left iliac artery.  She was admitted and taken to the operating room and underwent: 1. Ultrasound-guided access, right femoral artery 2. Abdominal aortogram 3. Left leg runoff 4. Mechanical thrombectomy using thePenumbradevice of the left common iliac, left external iliac, left common femoral, left profundofemoral, left superficial femoral, left popliteal, left anterior tibial, and left peroneal artery 5. Balloon angioplasty, left anterior tibial artery 6. Intra-arterial administration of TPA and nitroglycerin 7. Closure device (Proglide)  Post operatively, she regained doppler signals in the left PT/peroneal and her foot  was much improved.   She had a CT scan that revealed an incidental finding of an enlarging adnexal/ovarian soft tissue lesion and GYN was consulted.  She was started on Megace for her heavy menstrual bleeding.  And will f/u with GYN.  She was placed back on her coumadin.   She did receive transfusion due to acute blood loss anemia.   ABI's post operatively as follows:  RIGHT LEFT  ABI/ TBI 1.2 / 0.95 0.75 / 0   She presents today for follow up.  She states that she is doing well.  She has occasional twinges of pain in her left great toe occasionally.  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  She states she is down to 7 cigarettes a day.  She does still smoke a little marijuana.    Her INR was supra therapeutic 3 visits ago at the coumadin clinic but was 2.4 today.   She states she has been compliant with her medications.  She states she has an appointment with the GYN on Thursday but may have to cancel as she has a funeral but her boyfriend is going to try to get her there on time.  She states the Megace is upsetting her stomach.     Allergies  Allergen Reactions  . Ciprofloxacin Hcl Hives  . Macrobid [Nitrofurantoin Macrocrystal] Hives, Shortness Of Breath and Rash  . Other Hives, Shortness Of Breath and Rash    NO "-CILLINS"!!!  . Penicillins Shortness Of Breath    Had had cephalosporins without incident  . Sulfa Antibiotics Hives, Shortness Of Breath and Rash  . Lexapro [  Escitalopram Oxalate]     I just did not like it.    Current Outpatient Medications  Medication Sig Dispense Refill  . acetaminophen (TYLENOL) 500 MG tablet Take 2,000 mg by mouth every 6 (six) hours as needed.    Marland Kitchen aspirin EC 81 MG tablet Take 81 mg by mouth daily.    . blood glucose meter kit and supplies Dispense based on patient and insurance preference. Use up to four times daily as directed. (FOR ICD-9 250.00, 250.01). 1 each 0  . Blood Glucose Monitoring Suppl (TRUE METRIX  METER) w/Device KIT Use as directed 1 kit 0  . buPROPion (BUPROBAN) 150 MG 12 hr tablet Take 1 tablet (150 mg total) by mouth 2 (two) times daily. 60 tablet 2  . diphenhydrAMINE (BENADRYL) 25 MG tablet Take 25 mg by mouth every 6 (six) hours as needed.    . ferrous sulfate 325 (65 FE) MG tablet Take 1 tablet (325 mg total) by mouth daily with breakfast. 100 tablet 1  . gabapentin (NEURONTIN) 300 MG capsule TAKE 1 CAPSULE BY MOUTH 2 TIMES DAILY. (Patient taking differently: Take 300 mg by mouth 4 (four) times daily. ) 60 capsule 6  . glucose blood (TRUE METRIX BLOOD GLUCOSE TEST) test strip Use as instructed 100 each 12  . glucose blood test strip Use as instructed 100 each 0  . hydrOXYzine (ATARAX/VISTARIL) 25 MG tablet TAKE 1 TABLET BY MOUTH ONCE IN THE MORNING, 1 TABLET AT NOON, AND 2 TABLETS IN THE EVENING AS NEEDED 120 tablet 0  . insulin aspart (NOVOLOG) 100 UNIT/ML injection INJECT 5-15 UNITS INTO THE SKIN THREE TIMES DAILY WITH MEALS 10 mL 0  . insulin glargine (LANTUS) 100 UNIT/ML injection Inject 0.77 mLs (77 Units total) into the skin daily. (Patient taking differently: Inject 70 Units into the skin daily. ) 10 mL 11  . Insulin Pen Needle 31G X 5 MM MISC Use with Lantus and Novolog injections. 100 each 11  . Insulin Syringe-Needle U-100 (BD INSULIN SYRINGE ULTRAFINE) 31G X 5/16" 0.5 ML MISC Use as directed 100 each 3  . liraglutide (VICTOZA) 18 MG/3ML SOPN Inject 0.1 mLs (0.6 mg total) into the skin daily. 3 pen 3  . lisinopril (PRINIVIL,ZESTRIL) 5 MG tablet Take 0.5 tablets (2.5 mg total) by mouth daily. 45 tablet 3  . megestrol (MEGACE) 40 MG tablet Take 2 tablets (80 mg total) by mouth 2 (two) times daily. 60 tablet 0  . nicotine (NICODERM CQ - DOSED IN MG/24 HOURS) 14 mg/24hr patch Place 1 patch (14 mg total) onto the skin daily. 28 patch 1  . nystatin (MYCOSTATIN) 100000 UNIT/ML suspension Take 5 mLs (500,000 Units total) by mouth 4 (four) times daily. 60 mL 0  . omeprazole (PRILOSEC)  20 MG capsule Take 2 capsules (40 mg total) by mouth daily. 60 capsule 2  . ondansetron (ZOFRAN) 4 MG tablet Take 1 tablet (4 mg total) by mouth every 8 (eight) hours as needed for nausea or vomiting. 20 tablet 0  . oxyCODONE-acetaminophen (PERCOCET/ROXICET) 5-325 MG tablet Take 1 tablet by mouth every 8 (eight) hours as needed for moderate pain. 30 tablet 0  . TRUEPLUS LANCETS 28G MISC Use as directed 100 each 6  . warfarin (COUMADIN) 5 MG tablet Take 1 tablet (5 mg total) by mouth See admin instructions. 5 mg Sun, Mon , wed,  Fri 10 mg true, Thurs sat 60 tablet 1   No current facility-administered medications for this visit.      ROS:  See HPI  Physical Exam:  Vitals:   06/12/18 1550  BP: 127/77  Pulse: (!) 105  Resp: 16  Temp: 97.8 F (36.6 C)  SpO2: 100%   Vitals:   06/12/18 1550  Weight: 276 lb (125.2 kg)  Height: _0  (1.702 m)   Body mass index is 43.23 kg/m.  Heart: regular Lungs:  Non labored Incision:  Left groin incision is essentially healed.  Right groin is soft without hematoma.  Extremities:  Brisk left PT/DP doppler signal (DP signal is more lateral).  Left foot is warm and well perfused.    Assessment/Plan:  This is a 42 y.o. female who is s/p: 1. Ultrasound-guided access, right femoral artery 2. Abdominal aortogram 3. Left leg runoff 4. Mechanical thrombectomy using thePenumbradevice of the left common iliac, left external iliac, left common femoral, left profundofemoral, left superficial femoral, left popliteal, left anterior tibial, and left peroneal artery 5. Balloon angioplasty, left anterior tibial artery 6. Intra-arterial administration of TPA and nitroglycerin 7. Closure device (Proglide)  -pt doing well since discharge.  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  Praised her for this and reinforced importance to quit smoking all together.  She is working on this.  -her ABI's are unchanged from post op in the hospital.   She has  appointment with Dr. Donzetta Matters on 06/23/18 with aortoiliac duplex as well as LLE arterial duplex.  She also had ABI's ordered, but since they were done today, I have cancelled that test.   -she did request Tramadol today, but discussed with pt that pain medication is not prescribed after her type of procedure.  We discussed her PCP managing her pain and Neurontin so that it is managed by just one person.  She is in agreement. -discussed with her to continue to keep left groin clean and dry-her incision in the left groin is essentially healed.  -she has f/u appointment with GYN on Thursday-she does have a funeral to attend that day, but hopefully she can make that appointment for further evaluation of heavy menstrual bleeding/ovarian cyst. -continue coumadin.  INR today at coumadin clinic was 2.4.   Leontine Locket, PA-C [facility] [phone]  Clinic MD:  Trula Slade

## 2018-06-14 MED FILL — WARFARIN SODIUM 5 MG TABLET: 5 | 30 days supply | Qty: 60 | Fill #1

## 2018-06-14 MED FILL — LISINOPRIL 5 MG TAB: 5 | 30 days supply | Qty: 15 | Fill #4

## 2018-06-14 MED FILL — hydrOXYzine HCL 25 MG TABS: 25 | 30 days supply | Qty: 120 | Fill #0

## 2018-06-14 MED FILL — $LANTUS 100 UNITS/ML VIAL: 100 | 28 days supply | Qty: 20 | Fill #4

## 2018-06-14 MED FILL — **VICTOZA 18 MG/3 ML INJECT: 18 | 28 days supply | Qty: 3 | Fill #1

## 2018-06-14 MED FILL — FERROUS SULFATE 325 MG TAB: 325 (65 FE) | 30 days supply | Qty: 30 | Fill #3

## 2018-06-15 ENCOUNTER — Encounter: Payer: Self-pay | Admitting: Family Medicine

## 2018-06-15 ENCOUNTER — Ambulatory Visit: Payer: Self-pay | Admitting: Clinical

## 2018-06-15 ENCOUNTER — Other Ambulatory Visit (HOSPITAL_COMMUNITY)
Admission: RE | Admit: 2018-06-15 | Discharge: 2018-06-15 | Disposition: A | Discharge status: Home / Self Care | Payer: Medicaid Other | Source: Ambulatory Visit | Attending: Family Medicine | Admitting: Family Medicine

## 2018-06-15 ENCOUNTER — Ambulatory Visit (INDEPENDENT_AMBULATORY_CARE_PROVIDER_SITE_OTHER): Payer: Self-pay | Admitting: Family Medicine

## 2018-06-15 VITALS — Ht 67.0 in | Wt 275.8 lb

## 2018-06-15 DIAGNOSIS — N83201 Unspecified ovarian cyst, right side: Secondary | ICD-10-CM

## 2018-06-15 DIAGNOSIS — Z658 Other specified problems related to psychosocial circumstances: Secondary | ICD-10-CM

## 2018-06-15 DIAGNOSIS — R310 Gross hematuria: Secondary | ICD-10-CM

## 2018-06-15 DIAGNOSIS — N939 Abnormal uterine and vaginal bleeding, unspecified: Secondary | ICD-10-CM | POA: Insufficient documentation

## 2018-06-15 DIAGNOSIS — Z794 Long term (current) use of insulin: Secondary | ICD-10-CM

## 2018-06-15 DIAGNOSIS — F4321 Adjustment disorder with depressed mood: Secondary | ICD-10-CM

## 2018-06-15 DIAGNOSIS — E118 Type 2 diabetes mellitus with unspecified complications: Secondary | ICD-10-CM

## 2018-06-15 DIAGNOSIS — I749 Embolism and thrombosis of unspecified artery: Secondary | ICD-10-CM

## 2018-06-15 LAB — POCT URINALYSIS DIP (DEVICE)
Glucose, UA: NEGATIVE mg/dL
Nitrite: NEGATIVE
Protein, ur: 100 mg/dL — AB
Specific Gravity, Urine: 1.02 (ref 1.005–1.030)
Urobilinogen, UA: 0.2 mg/dL (ref 0.0–1.0)
pH: 5.5 (ref 5.0–8.0)

## 2018-06-15 MED ORDER — MEGESTROL ACETATE 40 MG PO TABS: 80.0000 mg | ORAL_TABLET | Freq: Every day | ORAL | 3 refills | Status: DC

## 2018-06-15 MED FILL — MEGESTROL 40 MG TABLET: 40 | 30 days supply | Qty: 60 | Fill #0

## 2018-06-15 NOTE — Progress Notes (Signed)
   Subjective:    Patient ID: Terri Sparks, female    DOB: 10-Jun-1976, 42 y.o.   MRN: [id number]  HPI Patient seen for follow up of AUB and hemorrhagic cyst. Patient seen by me in hospital and was prescribed megace 80mg  BID - patient has been taking only daily. No bleeding since that time, although has noticed hematuria every day over past several days. Sometimes has clots in bowl and when she wipes. No vaginal bleeding.   Review of Systems     Objective:   Physical Exam  Constitutional: She appears well-developed and well-nourished.  Cardiovascular: Normal rate and regular rhythm.  Pulmonary/Chest: Effort normal and breath sounds normal.  Abdominal: Hernia confirmed negative in the right inguinal area and confirmed negative in the left inguinal area.  Genitourinary: There is no rash, tenderness, lesion or injury on the right labia. There is no rash, tenderness, lesion or injury on the left labia. Uterus is not deviated, not enlarged, not fixed and not tender. Cervix exhibits friability. Cervix exhibits no motion tenderness and no discharge. Right adnexum displays no mass, no tenderness and no fullness. Left adnexum displays no mass, no tenderness and no fullness. No erythema, tenderness or bleeding in the vagina. No foreign body in the vagina. No signs of injury around the vagina.  Lymphadenopathy: No inguinal adenopathy noted on the right or left side.       Assessment & Plan:  1. Abnormal uterine bleeding (AUB) Reevaluate with ultrasound. Continue megace. - US PELVIS TRANSVANGINAL NON-OB (TV ONLY); Future - Cytology - PAP - Cervicovaginal ancillary only  2. Thromboembolism ([place]) On wafarin  3. Hemorrhagic cyst of right ovary F/u US ordered. - US PELVIS TRANSVANGINAL NON-OB (TV ONLY); Future  4. Type 2 diabetes mellitus with complication, with long-term current use of insulin ([place])  5. Gross hematuria UA abnormal. No blood in vaginal vault. Refer to urology.

## 2018-06-15 NOTE — BH Specialist Note (Signed)
Integrated Behavioral Health Initial Visit  MRN: [id number] Name: Terri Sparks  Number of [facility] Clinician visits:: 1/6 Session Start time: 3:17  Session End time: 3:22 Total time: 15 minutes  Type of Service: [place] Interpretor:No. Interpretor Name and Language: n/a   Warm Hand Off Completed.       SUBJECTIVE: Terri Sparks is a 42 y.o. female accompanied by Partner/Significant Other Patient was referred by Loma Boston, DO for symptoms of anxiety and depression. Patient reports the following symptoms/concerns: Pt denies SI, her "thoughts of death" are after attending a double funeral yesterday, and feeling overall fatigue Pt states her primary concern today is life stress, and she would like to come back to talk further after her next medical appointment, as her boyfriend has to be at work soon. Pt very open to taking home educational materials today to discuss at next visit.  Duration of problem: Ongoing, with increase in past week; Severity of problem: severe  OBJECTIVE: Mood: Depressed and Affect: Appropriate Risk of harm to self or others: No plan to harm self or others  LIFE CONTEXT: Family and Social: - School/Work: - Self-Care: - Life Changes: Abnormal uterine bleeding; attended double funeral yesterday (children's friends)  GOALS ADDRESSED: Patient will: 1. Reduce symptoms of: anxiety, depression and stress 2. Increase knowledge and/or ability of: stress reduction  3. Demonstrate ability to: Increase healthy adjustment to current life circumstances and Begin healthy grieving over loss  INTERVENTIONS: Interventions utilized: Supportive Counseling and Psychoeducation and/or Health Education  Standardized Assessments completed: GAD-7 and PHQ 9  ASSESSMENT: Patient currently experiencing Grief and Psychosocial stress.   Patient may benefit from psychoeducation and brief therapeutic interventions  regarding coping with symptoms of depression and anxiety related to life stress and grief .  PLAN: 1. Follow up with behavioral health clinician on : One week; phone follow up in one day 2. Behavioral recommendations:  -Read educational materials regarding coping with symptoms of depression and anxiety -Continue taking medication as prescribed by medical providers  3. Referral(s): [place] (In Clinic) 4. "From scale of 1-10, how likely are you to follow plan?": 10  Garlan Fair, LCSW  Depression screen [facility] 2/9 06/15/2018 03/14/2018 01/27/2018 09/29/2017 08/01/2017  Decreased Interest 0 1 2 0 2  Down, Depressed, Hopeless 1 2 3 3 3   PHQ - 2 Score 1 3 5 3 5   Altered sleeping 2 3 3 3 3   Tired, decreased energy 1 2 2 2 1   Change in appetite 1 3 2 3 1   Feeling bad or failure about yourself  3 3 3 3 2   Trouble concentrating 1 1 3  0 3  Moving slowly or fidgety/restless 0 3 3 3 1   Suicidal thoughts 3 3 3 3 3   PHQ-9 Score 12 21 24 20 19    GAD 7 : Generalized Anxiety Score 06/15/2018 03/14/2018 01/27/2018 09/29/2017  Nervous, Anxious, on Edge 1 3 3 3   Control/stop worrying 3 3 3 3   Worry too much - different things 3 3 3 3   Trouble relaxing 3 3 3 3   Restless 1 3 3 3   Easily annoyed or irritable 1 3 3 3   Afraid - awful might happen 2 3 3 3   Total GAD 7 Score 14 21 21  21

## 2018-06-16 LAB — CERVICOVAGINAL ANCILLARY ONLY
Bacterial vaginitis: NEGATIVE
Candida vaginitis: NEGATIVE
Chlamydia: NEGATIVE
Neisseria Gonorrhea: NEGATIVE
Trichomonas: NEGATIVE

## 2018-06-19 ENCOUNTER — Telehealth: Payer: Self-pay | Admitting: Clinical

## 2018-06-19 LAB — CYTOLOGY - PAP
Diagnosis: NEGATIVE
HPV: NOT DETECTED

## 2018-06-19 NOTE — Telephone Encounter (Signed)
Follow up mood check, as agreed-upon by patient; pt says that although the medication she is on is making her feel sick, she thinks she is coping pretty well, and appreciates the check-in with her.   Pt says she plans to attend her upcoming appointments (with [facility] and Radiology); is aware she may make an appointment with integrated behavioral health, as needed.

## 2018-06-20 ENCOUNTER — Ambulatory Visit (HOSPITAL_BASED_OUTPATIENT_CLINIC_OR_DEPARTMENT_OTHER): Payer: Self-pay | Admitting: Pharmacist

## 2018-06-20 ENCOUNTER — Encounter: Payer: Self-pay | Admitting: Internal Medicine

## 2018-06-20 ENCOUNTER — Ambulatory Visit: Payer: Self-pay | Attending: Internal Medicine | Admitting: Internal Medicine

## 2018-06-20 ENCOUNTER — Encounter: Payer: Self-pay | Admitting: Pharmacist

## 2018-06-20 VITALS — BP 139/87 | HR 103 | Temp 98.4°F | Resp 16 | Wt 275.8 lb

## 2018-06-20 DIAGNOSIS — IMO0001 Reserved for inherently not codable concepts without codable children: Secondary | ICD-10-CM

## 2018-06-20 DIAGNOSIS — G5792 Unspecified mononeuropathy of left lower limb: Secondary | ICD-10-CM

## 2018-06-20 DIAGNOSIS — E1165 Type 2 diabetes mellitus with hyperglycemia: Secondary | ICD-10-CM | POA: Insufficient documentation

## 2018-06-20 DIAGNOSIS — R319 Hematuria, unspecified: Secondary | ICD-10-CM

## 2018-06-20 DIAGNOSIS — F17210 Nicotine dependence, cigarettes, uncomplicated: Secondary | ICD-10-CM | POA: Insufficient documentation

## 2018-06-20 DIAGNOSIS — R918 Other nonspecific abnormal finding of lung field: Secondary | ICD-10-CM | POA: Insufficient documentation

## 2018-06-20 DIAGNOSIS — K219 Gastro-esophageal reflux disease without esophagitis: Secondary | ICD-10-CM | POA: Insufficient documentation

## 2018-06-20 DIAGNOSIS — Z79899 Other long term (current) drug therapy: Secondary | ICD-10-CM | POA: Insufficient documentation

## 2018-06-20 DIAGNOSIS — E279 Disorder of adrenal gland, unspecified: Secondary | ICD-10-CM

## 2018-06-20 DIAGNOSIS — I749 Embolism and thrombosis of unspecified artery: Secondary | ICD-10-CM

## 2018-06-20 DIAGNOSIS — Z881 Allergy status to other antibiotic agents status: Secondary | ICD-10-CM | POA: Insufficient documentation

## 2018-06-20 DIAGNOSIS — Z794 Long term (current) use of insulin: Secondary | ICD-10-CM

## 2018-06-20 DIAGNOSIS — Z7901 Long term (current) use of anticoagulants: Secondary | ICD-10-CM | POA: Insufficient documentation

## 2018-06-20 DIAGNOSIS — E118 Type 2 diabetes mellitus with unspecified complications: Secondary | ICD-10-CM

## 2018-06-20 DIAGNOSIS — E278 Other specified disorders of adrenal gland: Secondary | ICD-10-CM

## 2018-06-20 DIAGNOSIS — Z760 Encounter for issue of repeat prescription: Secondary | ICD-10-CM | POA: Insufficient documentation

## 2018-06-20 DIAGNOSIS — Z880 Allergy status to penicillin: Secondary | ICD-10-CM | POA: Insufficient documentation

## 2018-06-20 DIAGNOSIS — Z7982 Long term (current) use of aspirin: Secondary | ICD-10-CM | POA: Insufficient documentation

## 2018-06-20 DIAGNOSIS — E1140 Type 2 diabetes mellitus with diabetic neuropathy, unspecified: Secondary | ICD-10-CM | POA: Insufficient documentation

## 2018-06-20 DIAGNOSIS — Z23 Encounter for immunization: Secondary | ICD-10-CM | POA: Insufficient documentation

## 2018-06-20 DIAGNOSIS — I745 Embolism and thrombosis of iliac artery: Secondary | ICD-10-CM | POA: Insufficient documentation

## 2018-06-20 DIAGNOSIS — F419 Anxiety disorder, unspecified: Secondary | ICD-10-CM | POA: Insufficient documentation

## 2018-06-20 DIAGNOSIS — Z882 Allergy status to sulfonamides status: Secondary | ICD-10-CM | POA: Insufficient documentation

## 2018-06-20 DIAGNOSIS — R030 Elevated blood-pressure reading, without diagnosis of hypertension: Secondary | ICD-10-CM | POA: Insufficient documentation

## 2018-06-20 DIAGNOSIS — Z888 Allergy status to other drugs, medicaments and biological substances status: Secondary | ICD-10-CM | POA: Insufficient documentation

## 2018-06-20 DIAGNOSIS — N939 Abnormal uterine and vaginal bleeding, unspecified: Secondary | ICD-10-CM | POA: Insufficient documentation

## 2018-06-20 DIAGNOSIS — N200 Calculus of kidney: Secondary | ICD-10-CM | POA: Insufficient documentation

## 2018-06-20 LAB — POCT GLYCOSYLATED HEMOGLOBIN (HGB A1C): HbA1c, POC (controlled diabetic range): 8.7 % — AB (ref 0.0–7.0)

## 2018-06-20 LAB — GLUCOSE, POCT (MANUAL RESULT ENTRY): POC Glucose: 299 mg/dl — AB (ref 70–99)

## 2018-06-20 MED ORDER — INSULIN PEN NEEDLE 31G X 5 MM MISC: 11 refills | Status: DC

## 2018-06-20 MED ORDER — GABAPENTIN 300 MG PO CAPS: 600.0000 mg | ORAL_CAPSULE | Freq: Two times a day (BID) | ORAL | 6 refills | Status: DC

## 2018-06-20 MED ORDER — INSULIN GLARGINE 100 UNIT/ML SOLOSTAR PEN: 70.0000 [IU] | PEN_INJECTOR | Freq: Every day | SUBCUTANEOUS | 11 refills | Status: DC

## 2018-06-20 MED ORDER — INSULIN ASPART 100 UNIT/ML FLEXPEN: PEN_INJECTOR | SUBCUTANEOUS | 11 refills | Status: DC

## 2018-06-20 MED ORDER — TRAMADOL HCL 50 MG PO TABS: 50.0000 mg | ORAL_TABLET | Freq: Every day | ORAL | 0 refills | Status: DC | PRN

## 2018-06-20 MED FILL — TRUEPLUS PEN NDL 31G X 1/4": 31G X 6 MM | 25 days supply | Qty: 100 | Fill #0

## 2018-06-20 MED FILL — GABAPENTIN 300 MG CAPSULE: 300 | 30 days supply | Qty: 120 | Fill #0

## 2018-06-20 MED FILL — !NOVOLOG 100UNITS/ML VIAL: 100/ML | 22 days supply | Qty: 10 | Fill #0

## 2018-06-20 MED FILL — TRUEPLUS PEN NDL 31G X 1/4: 31G X 6 MM | 25 days supply | Qty: 100 | Fill #0

## 2018-06-20 NOTE — Patient Instructions (Signed)

## 2018-06-20 NOTE — Progress Notes (Signed)
Patient ID: [name], female    DOB: 05-14-1976  MRN: [id number]  CC: Diabetes and Medication Refill   Subjective: Terri Sparks is a 42 y.o. female who presents for hosp f/u.  Boyfriend Terri Sparks is wiih her Her concerns today include:  42 year old female with history ofDM type2 with microalbumin, tob dep,PVDwith hx LLEacute limb ischemia requiring stent to the iliac artery and embolectomy followed by Dr. Zenia Sparks vascular surgery, hyperparathyroidism s/p resection of parathryroid adenoma 2010, GERD, IDA, and anxiety/dep  Since last visit with me patient was hospitalized 8/9-16/2019 with acute ischemia of the left leg.  Patient states that she was feeling sick on the stomach and was having heavy menstruation so  discontinued taking all of her medicines including warfarin for several days.  She presented with pain in the left lower extremity with subtherapeutic INR.  CT revealed occlusion of the left iliac artery.  She underwent mechanical thrombectomy and balloon angioplasty of the left anterior tibial artery by Dr. Russella Sparks.  She has followed up with vascular surgeon since hospital.  Gabapentin was increased to 300 mg 4 times a day due to neuropathy symptoms in LT foot.  She is needing a new prescription to reflect the increased dose.  She is also requesting a prescription for tramadol to take as needed for discomfort in the left foot that is worse especially with weather changes.  She had been getting rxn from vascular surgeon but was told that any further RFs would need to come from [name].  -Incidental finding on CAT scan was an enlarged right adnexal/ovarian soft tissue lesion.  She was seen by GYN and started on Megace.  Reports no major vaginal bleeding since hospital discharge.   Saw Dr. Nehemiah Sparks last week.   Megace continued and pelvic ultrasound ordered.  Patient reported to him that she was having some blood in urine.  Patient referred to urology.  She is awaiting that appointment.   She has not noticed any further blood in the urine.  CT scan of the abdomen from hospital revealed multiple bilateral nonobstructing renal stones.    DM:  Checking 4 x a day before meals and at bedtime.  Does not have log with her but reports that for the most part readings have been below 200.   Compliant with Lantus 70, 5-15 units Novolog.  Never started Vicotzia but plans to start Megace makes her nausea.  Would like pens instead of insulin vials.   Incidental finding on CAT scan of the abdomen was left adrenal nodule of 1.5 cm that has been stable in size.  Also 2 right lung nodules that have also been stable in size since 2015.  Thought to be subpleural LNs.  Patient Active Problem List   Diagnosis Date Noted  . Abnormal uterine bleeding (AUB) 05/14/2018  . Hemorrhagic cyst of right ovary 05/14/2018  . Acute blood loss anemia 05/14/2018  . Ischemia of extremity 05/12/2018  . Former smoker 03/14/2018  . 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 Thromboembolism ([place]) 02/25/2018  . Microalbuminuria 09/29/2017  . Insomnia 09/29/2017  . Immunization due 08/01/2017  . Anxiety and depression 04/28/2017  . 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 Iliac artery occlusion, left ([place]) 02/25/2017  . Tobacco abuse 02/25/2017  . Diabetes mellitus, type II ([place]) 02/25/2017  . Peripheral vascular disease of lower extremity ([place]) 02/24/2017  . GERD (gastroesophageal reflux disease)   . Hyperparathyroidism      Current Outpatient Medications on File  Prior to Visit  Medication Sig Dispense Refill  . acetaminophen (TYLENOL) 500 MG tablet Take 2,000 mg by mouth every 6 (six) hours as needed.    Marland Kitchen aspirin EC 81 MG tablet Take 81 mg by mouth daily.    . blood glucose meter kit and supplies Dispense based on patient and insurance preference. Use up to four times daily as directed. (FOR ICD-9 250.00, 250.01). 1 each 0  . Blood Glucose Monitoring Suppl (TRUE METRIX METER) w/Device KIT  Use as directed 1 kit 0  . buPROPion (BUPROBAN) 150 MG 12 hr tablet Take 1 tablet (150 mg total) by mouth 2 (two) times daily. 60 tablet 2  . diphenhydrAMINE (BENADRYL) 25 MG tablet Take 25 mg by mouth every 6 (six) hours as needed.    . docusate sodium (COLACE) 100 MG capsule Take 100 mg by mouth daily.    . ferrous sulfate 325 (65 FE) MG tablet Take 1 tablet (325 mg total) by mouth daily with breakfast. 100 tablet 1  . gabapentin (NEURONTIN) 300 MG capsule TAKE 1 CAPSULE BY MOUTH 2 TIMES DAILY. (Patient taking differently: Take 300 mg by mouth 4 (four) times daily. ) 60 capsule 6  . glucose blood (TRUE METRIX BLOOD GLUCOSE TEST) test strip Use as instructed 100 each 12  . glucose blood test strip Use as instructed 100 each 0  . hydrOXYzine (ATARAX/VISTARIL) 25 MG tablet TAKE 1 TABLET BY MOUTH ONCE IN THE MORNING, 1 TABLET AT NOON, AND 2 TABLETS IN THE EVENING AS NEEDED 120 tablet 0  . insulin aspart (NOVOLOG) 100 UNIT/ML injection INJECT 5-15 UNITS INTO THE SKIN THREE TIMES DAILY WITH MEALS 10 mL 0  . insulin glargine (LANTUS) 100 UNIT/ML injection Inject 0.77 mLs (77 Units total) into the skin daily. (Patient taking differently: Inject 70 Units into the skin daily. ) 10 mL 11  . Insulin Pen Needle 31G X 5 MM MISC Use with Lantus and Novolog injections. 100 each 11  . Insulin Syringe-Needle U-100 (BD INSULIN SYRINGE ULTRAFINE) 31G X 5/16" 0.5 ML MISC Use as directed 100 each 3  . liraglutide (VICTOZA) 18 MG/3ML SOPN Inject 0.1 mLs (0.6 mg total) into the skin daily. (Patient not taking: Reported on 06/15/2018) 3 pen 3  . lisinopril (PRINIVIL,ZESTRIL) 5 MG tablet Take 0.5 tablets (2.5 mg total) by mouth daily. 45 tablet 3  . megestrol (MEGACE) 40 MG tablet Take 2 tablets (80 mg total) by mouth daily. 180 tablet 3  . nicotine (NICODERM CQ - DOSED IN MG/24 HOURS) 14 mg/24hr patch Place 1 patch (14 mg total) onto the skin daily. (Patient not taking: Reported on 06/15/2018) 28 patch 1  . omeprazole  (PRILOSEC) 20 MG capsule Take 2 capsules (40 mg total) by mouth daily. 60 capsule 2  . ondansetron (ZOFRAN) 4 MG tablet Take 1 tablet (4 mg total) by mouth every 8 (eight) hours as needed for nausea or vomiting. 20 tablet 0  . TRUEPLUS LANCETS 28G MISC Use as directed 100 each 6  . warfarin (COUMADIN) 5 MG tablet Take 1 tablet (5 mg total) by mouth See admin instructions. 5 mg Sun, Mon , wed,  Fri 10 mg true, Thurs sat 60 tablet 1   No current facility-administered medications on file prior to visit.     Allergies  Allergen Reactions  . Ciprofloxacin Hcl Hives  . Macrobid [Nitrofurantoin Macrocrystal] Hives, Shortness Of Breath and Rash  . Other Hives, Shortness Of Breath and Rash    NO "-CILLINS"!!!  .  Penicillins Shortness Of Breath    Had had cephalosporins without incident  . Sulfa Antibiotics Hives, Shortness Of Breath and Rash  . Lexapro [Escitalopram Oxalate]     I just did not like it.    Social History   Socioeconomic History  . Marital status: Single    Spouse name: Not on file  . Number of children: Not on file  . Years of education: Not on file  . Highest education level: Not on file  Occupational History  . Not on file  Social Needs  . Financial resource strain: Not on file  . Food insecurity:    Worry: Not on file    Inability: Not on file  . Transportation needs:    Medical: Not on file    Non-medical: Not on file  Tobacco Use  . Smoking status: Current Some Day Smoker    Packs/day: 0.25    Years: 15.00    Pack years: 3.75    Types: Cigarettes    Last attempt to quit: 03/04/2017    Years since quitting: 1.2  . Smokeless tobacco: Never Used  Substance and Sexual Activity  . Alcohol use: No  . Drug use: Yes    Types: Marijuana    Comment: occasionally  . Sexual activity: Yes    Partners: Male    Birth control/protection: Other-see comments    Comment: BTL  Lifestyle  . Physical activity:    Days per week: Not on file    Minutes per session: Not  on file  . Stress: Not on file  Relationships  . Social connections:    Talks on phone: Not on file    Gets together: Not on file    Attends religious service: Not on file    Active member of club or organization: Not on file    Attends meetings of clubs or organizations: Not on file    Relationship status: Not on file  . Intimate partner violence:    Fear of current or ex partner: Not on file    Emotionally abused: Not on file    Physically abused: Not on file    Forced sexual activity: Not on file  Other Topics Concern  . Not on file  Social History Narrative  . Not on file    Family History  Problem Relation Age of Onset  . Depression Mother   . Diabetes Father   . Heart failure Father     Past Surgical History:  Procedure Laterality Date  . ABDOMINAL AORTOGRAM W/LOWER EXTREMITY N/A 11/14/2017   Procedure: ABDOMINAL AORTOGRAM W/LOWER EXTREMITY;  Surgeon: Waynetta Sandy, MD;  Location: [facility];  Service: Cardiovascular;  Laterality: N/A;  . ANGIOPLASTY ILLIAC ARTERY Left 02/25/2018   Procedure: BALLOON ANGIOPLASTY LEFT ILIAC ARTERY;  Surgeon: Conrad Ong, MD;  Location: [facility];  Service: Vascular;  Laterality: Left;  . APPLICATION OF WOUND VAC Left 03/03/2018   Procedure: APPLICATION OF WOUND VAC;  Surgeon: Angelia Mould, MD;  Location: [facility];  Service: Vascular;  Laterality: Left;  . EMBOLECTOMY Left 02/24/2017   Procedure: Left Lower Extremity Embolectomy and Angiogram.;  Surgeon: Waynetta Sandy, MD;  Location: [facility];  Service: Vascular;  Laterality: Left;  . INSERTION OF ILIAC STENT  02/24/2017   Procedure: INSERTION OF Common ILIAC STENT;  Surgeon: Waynetta Sandy, MD;  Location: [facility];  Service: Vascular;;  . INTRAOPERATIVE ARTERIOGRAM Left 02/25/2018   Procedure: INTRA OPERATIVE ARTERIOGRAM WITH LEFT LEG RUNOFF;  Surgeon: Conrad [place], MD;  Location: [facility];  Service: Vascular;  Laterality: Left;  . LOWER EXTREMITY  ANGIOGRAM Left 02/24/2017   Procedure: Aortagram, Left lower extremity Run-off;  Surgeon: Waynetta Sandy, MD;  Location: [facility];  Service: Vascular;  Laterality: Left;  . PATCH ANGIOPLASTY Left 02/25/2018   Procedure: PATCH ANGIOPLASTY LEFT SUPERFICIAL FEMORAL ARTERY WITH BOVINE PATCH;  Surgeon: Conrad [place], MD;  Location: [facility];  Service: Vascular;  Laterality: Left;  . PERCUTANEOUS VENOUS THROMBECTOMY,LYSIS WITH INTRAVASCULAR ULTRASOUND (IVUS) Left 05/12/2018   Procedure: MECHANICAL THROMBECTOMY LEFT LEG, BALLOON ANGIOPLASTY LEFT ANTERIOR TIBIAL ARTERY, AORTOGRAM WITH LEFT LEG RUNOFF;  Surgeon: Serafina Mitchell, MD;  Location: [facility];  Service: Vascular;  Laterality: Left;  . 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 THROMBECTOMY FEMORAL ARTERY Left 02/25/2018   Procedure: LEFT ILIAC AND POPLITEAL ARTERY THROMBECTOMY;  Surgeon: Conrad [place], MD;  Location: [facility];  Service: Vascular;  Laterality: Left;  . TUBAL LIGATION    . WOUND DEBRIDEMENT Left 03/03/2018   Procedure: DEBRIDEMENT WOUND;  Surgeon: Angelia Mould, MD;  Location: [facility];  Service: Vascular;  Laterality: Left;  . WOUND EXPLORATION Left 03/03/2018   Procedure: WOUND EXPLORATION;  Surgeon: Angelia Mould, MD;  [facility];  Service: Vascular;  Laterality: Left;    ROS: Review of Systems Negative except as above PHYSICAL EXAM: BP 139/87   Pulse (!) 103   Temp 98.4 F (36.9 C) (Oral)   Resp 16   Wt 275 lb 12.8 oz (125.1 kg)   SpO2 99%   BMI 43.20 kg/m   Physical Exam  General appearance - alert, well appearing, and in no distress Mental status -patient tearful at times  neck - supple, no significant adenopathy Chest - clear to auscultation, no wheezes, rales or rhonchi, symmetric air entry Heart - normal rate, regular rhythm, normal S1, S2, no murmurs, rubs, clicks or gallops Extremities -lower extremity edema. LEAP: Dry skin around the borders of both feet.  No decreased sensation on leap exam.   Feet are warm.  Results for orders placed or performed in visit on 06/20/18  POCT glucose (manual entry)  Result Value Ref Range   POC Glucose 299 (A) 70 - 99 mg/dl  POCT glycosylated hemoglobin (Hb A1C)  Result Value Ref Range   Hemoglobin A1C     HbA1c POC (<> result, manual entry)     HbA1c, POC (prediabetic range)     HbA1c, POC (controlled diabetic range) 8.7 (A) 0.0 - 7.0 %   Lab Results  Component Value Date   HGBA1C 8.7 (A) 06/20/2018   Lab Results  Component Value Date   WBC 10.2 05/19/2018   HGB 9.3 (L) 05/19/2018   HCT 31.5 (L) 05/19/2018   MCV 92.6 05/19/2018   PLT 391 05/19/2018     Chemistry      Component Value Date/Time   NA 138 05/16/2018 0343   NA 134 01/27/2018 1157   K 4.4 05/16/2018 0343   CL 98 05/16/2018 0343   CO2 28 05/16/2018 0343   BUN 13 05/16/2018 0343   BUN 14 01/27/2018 1157   CREATININE 1.55 (H) 05/16/2018 0343      Component Value Date/Time   CALCIUM 9.9 05/16/2018 0343   ALKPHOS 85 05/12/2018 0613   AST 14 (L) 05/12/2018 0613   ALT 11 05/12/2018 0613   BILITOT 0.5 05/12/2018 0613   BILITOT <0.2 01/27/2018 1157     ASSESSMENT AND PLAN: 1.  Uncontrolled type 2 diabetes mellitus without complication, with long-term current use of insulin ([place]) Not at goal.  Pt already has Vicotoza pens at home.  She wants to give it try to see if she tolerates.  Advise that if she has increase nausea, to stop and let me know.  We will then up titrate her insulins - POCT glucose (manual entry) - POCT glycosylated hemoglobin (Hb A1C) - Insulin Glargine (LANTUS SOLOSTAR) 100 UNIT/ML Solostar Pen; Inject 70 Units into the skin daily.  Dispense: 15 mL; Refill: 11 - insulin aspart (NOVOLOG FLEXPEN) 100 UNIT/ML FlexPen; 5-15 units subcut TID with meals  Dispense: 15 mL; Refill: 11 - Insulin Pen Needle 31G X 5 MM MISC; Use with Lantus and Novolog injections.  Dispense: 100 each; Refill: 11  2. Neuropathy of left lower extremity Gabapentin dose changed to  600 mg BID - gabapentin (NEURONTIN) 300 MG capsule; Take 2 capsules (600 mg total) by mouth 2 (two) times daily.  Dispense: 120 capsule; Refill: 6 - traMADol (ULTRAM) 50 MG tablet; Take 1 tablet (50 mg total) by mouth daily as needed.  Dispense: 30 tablet; Refill: 0  3. Hematuria, unspecified type Referred to urology  4. Abnormal uterine bleeding (AUB) Followed by GYN  5. Need for immunization against influenza - Flu Vaccine QUAD 36+ mos IM  6. Elevated blood pressure reading BP readings were okay on recent visits with specialist.  Advise DASH diet. BP can be rechecked when she sees clinical pharmacist later this mth  7. Adrenal nodule ([place]) Appeared stable in size on past imaging.  Will check for function by doing low dose overnight dexamethasone suppression and plasma metanephrines  8. Lung nodules Stable,  No further work up at this time  9. Thromboembolism ([place]) -refer to Hematology once she has completed paperwork for [facility] discount and is approved    Patient was given the opportunity to ask questions.  Patient verbalized understanding of the plan and was able to repeat key elements of the plan.   Orders Placed This Encounter  Procedures  . POCT glucose (manual entry)  . POCT glycosylated hemoglobin (Hb A1C)     Requested Prescriptions    No prescriptions requested or ordered in this encounter    No follow-ups on file.  Karle Plumber, MD, FACP

## 2018-06-20 NOTE — Progress Notes (Signed)
Pt is wanting to ger a refill on Tramadol  PT states the hydroxyzine does help during the day but at night it doesn't

## 2018-06-20 NOTE — Progress Notes (Signed)
Patient was educated on the use of the Lantus insulin pen. Reviewed necessary supplies and operation of the pen. Also reviewed goal blood glucose levels. Patient was able to demonstrate use. All questions and concerns were addressed.

## 2018-06-21 ENCOUNTER — Telehealth: Payer: Self-pay

## 2018-06-21 ENCOUNTER — Other Ambulatory Visit: Payer: Self-pay

## 2018-06-21 DIAGNOSIS — R918 Other nonspecific abnormal finding of lung field: Secondary | ICD-10-CM | POA: Insufficient documentation

## 2018-06-21 DIAGNOSIS — E278 Other specified disorders of adrenal gland: Secondary | ICD-10-CM | POA: Insufficient documentation

## 2018-06-21 DIAGNOSIS — E279 Disorder of adrenal gland, unspecified: Secondary | ICD-10-CM

## 2018-06-21 MED ORDER — DEXAMETHASONE 1 MG PO TABS: ORAL_TABLET | ORAL | 0 refills | Status: DC

## 2018-06-21 NOTE — Telephone Encounter (Signed)
Contacted the pt to see what pharmacy I can resend the Dexamethasone rx to because our pharmacy doesn't have the 1MG . Pt didn't answer left a detailed vm informing pt of this information and if she can give me a call back with the pharmacy

## 2018-06-22 ENCOUNTER — Ambulatory Visit (HOSPITAL_COMMUNITY): Admission: RE | Admit: 2018-06-22 | Payer: Medicaid Other | Source: Ambulatory Visit

## 2018-06-23 ENCOUNTER — Encounter (HOSPITAL_COMMUNITY): Payer: Self-pay

## 2018-06-23 ENCOUNTER — Inpatient Hospital Stay (HOSPITAL_COMMUNITY): Admission: RE | Admit: 2018-06-23 | Payer: Self-pay | Source: Ambulatory Visit

## 2018-06-23 ENCOUNTER — Other Ambulatory Visit: Payer: Self-pay

## 2018-06-23 ENCOUNTER — Telehealth: Payer: Self-pay | Admitting: Internal Medicine

## 2018-06-23 MED ORDER — DEXAMETHASONE 1 MG PO TABS: ORAL_TABLET | ORAL | 0 refills | Status: DC

## 2018-06-23 NOTE — Telephone Encounter (Signed)
Patient came in today inquiring about her medication. She was informed of the note and says that she would like [facility] to [facility] [email or web address].

## 2018-06-23 NOTE — Telephone Encounter (Signed)
COntacted pt and left a detailed vm informing pt that rx is [facility] [address]

## 2018-06-26 ENCOUNTER — Emergency Department (HOSPITAL_COMMUNITY): Payer: Medicaid Other

## 2018-06-26 ENCOUNTER — Encounter (HOSPITAL_COMMUNITY): Payer: Self-pay | Admitting: *Deleted

## 2018-06-26 ENCOUNTER — Inpatient Hospital Stay (HOSPITAL_COMMUNITY)
Admission: EM | Admit: 2018-06-26 | Discharge: 2018-07-05 | DRG: 853 | Disposition: A | Discharge status: Home / Self Care | Payer: Medicaid Other | Attending: Internal Medicine | Admitting: Internal Medicine

## 2018-06-26 ENCOUNTER — Emergency Department (HOSPITAL_BASED_OUTPATIENT_CLINIC_OR_DEPARTMENT_OTHER): Payer: Medicaid Other

## 2018-06-26 DIAGNOSIS — E872 Acidosis: Secondary | ICD-10-CM | POA: Diagnosis present

## 2018-06-26 DIAGNOSIS — R791 Abnormal coagulation profile: Secondary | ICD-10-CM | POA: Diagnosis present

## 2018-06-26 DIAGNOSIS — E871 Hypo-osmolality and hyponatremia: Secondary | ICD-10-CM | POA: Diagnosis present

## 2018-06-26 DIAGNOSIS — I129 Hypertensive chronic kidney disease with stage 1 through stage 4 chronic kidney disease, or unspecified chronic kidney disease: Secondary | ICD-10-CM | POA: Diagnosis present

## 2018-06-26 DIAGNOSIS — Z794 Long term (current) use of insulin: Secondary | ICD-10-CM

## 2018-06-26 DIAGNOSIS — Z7901 Long term (current) use of anticoagulants: Secondary | ICD-10-CM

## 2018-06-26 DIAGNOSIS — G5792 Unspecified mononeuropathy of left lower limb: Secondary | ICD-10-CM

## 2018-06-26 DIAGNOSIS — Z818 Family history of other mental and behavioral disorders: Secondary | ICD-10-CM

## 2018-06-26 DIAGNOSIS — E1151 Type 2 diabetes mellitus with diabetic peripheral angiopathy without gangrene: Secondary | ICD-10-CM | POA: Diagnosis present

## 2018-06-26 DIAGNOSIS — N179 Acute kidney failure, unspecified: Secondary | ICD-10-CM | POA: Diagnosis present

## 2018-06-26 DIAGNOSIS — Z880 Allergy status to penicillin: Secondary | ICD-10-CM | POA: Diagnosis not present

## 2018-06-26 DIAGNOSIS — A419 Sepsis, unspecified organism: Secondary | ICD-10-CM

## 2018-06-26 DIAGNOSIS — E1165 Type 2 diabetes mellitus with hyperglycemia: Secondary | ICD-10-CM | POA: Diagnosis present

## 2018-06-26 DIAGNOSIS — R0602 Shortness of breath: Secondary | ICD-10-CM | POA: Diagnosis present

## 2018-06-26 DIAGNOSIS — Z882 Allergy status to sulfonamides status: Secondary | ICD-10-CM

## 2018-06-26 DIAGNOSIS — K219 Gastro-esophageal reflux disease without esophagitis: Secondary | ICD-10-CM | POA: Diagnosis present

## 2018-06-26 DIAGNOSIS — Z87442 Personal history of urinary calculi: Secondary | ICD-10-CM

## 2018-06-26 DIAGNOSIS — Z7982 Long term (current) use of aspirin: Secondary | ICD-10-CM

## 2018-06-26 DIAGNOSIS — A4151 Sepsis due to Escherichia coli [E. coli]: Secondary | ICD-10-CM | POA: Diagnosis present

## 2018-06-26 DIAGNOSIS — F17210 Nicotine dependence, cigarettes, uncomplicated: Secondary | ICD-10-CM | POA: Diagnosis present

## 2018-06-26 DIAGNOSIS — N136 Pyonephrosis: Secondary | ICD-10-CM | POA: Diagnosis present

## 2018-06-26 DIAGNOSIS — Z833 Family history of diabetes mellitus: Secondary | ICD-10-CM

## 2018-06-26 DIAGNOSIS — E119 Type 2 diabetes mellitus without complications: Secondary | ICD-10-CM

## 2018-06-26 DIAGNOSIS — I10 Essential (primary) hypertension: Secondary | ICD-10-CM | POA: Diagnosis present

## 2018-06-26 DIAGNOSIS — F419 Anxiety disorder, unspecified: Secondary | ICD-10-CM | POA: Diagnosis present

## 2018-06-26 DIAGNOSIS — Z419 Encounter for procedure for purposes other than remedying health state, unspecified: Secondary | ICD-10-CM

## 2018-06-26 DIAGNOSIS — Z8249 Family history of ischemic heart disease and other diseases of the circulatory system: Secondary | ICD-10-CM

## 2018-06-26 DIAGNOSIS — N390 Urinary tract infection, site not specified: Secondary | ICD-10-CM

## 2018-06-26 DIAGNOSIS — E875 Hyperkalemia: Secondary | ICD-10-CM | POA: Diagnosis present

## 2018-06-26 DIAGNOSIS — N202 Calculus of kidney with calculus of ureter: Secondary | ICD-10-CM | POA: Diagnosis present

## 2018-06-26 DIAGNOSIS — N183 Chronic kidney disease, stage 3 unspecified: Secondary | ICD-10-CM | POA: Diagnosis present

## 2018-06-26 DIAGNOSIS — Z6841 Body Mass Index (BMI) 40.0 and over, adult: Secondary | ICD-10-CM | POA: Diagnosis not present

## 2018-06-26 DIAGNOSIS — F32A Depression, unspecified: Secondary | ICD-10-CM | POA: Diagnosis present

## 2018-06-26 DIAGNOSIS — Z86718 Personal history of other venous thrombosis and embolism: Secondary | ICD-10-CM | POA: Diagnosis not present

## 2018-06-26 DIAGNOSIS — R6521 Severe sepsis with septic shock: Secondary | ICD-10-CM | POA: Diagnosis present

## 2018-06-26 DIAGNOSIS — F329 Major depressive disorder, single episode, unspecified: Secondary | ICD-10-CM | POA: Diagnosis present

## 2018-06-26 DIAGNOSIS — Z881 Allergy status to other antibiotic agents status: Secondary | ICD-10-CM

## 2018-06-26 DIAGNOSIS — E1122 Type 2 diabetes mellitus with diabetic chronic kidney disease: Secondary | ICD-10-CM | POA: Diagnosis present

## 2018-06-26 DIAGNOSIS — E118 Type 2 diabetes mellitus with unspecified complications: Secondary | ICD-10-CM

## 2018-06-26 DIAGNOSIS — E861 Hypovolemia: Secondary | ICD-10-CM | POA: Diagnosis present

## 2018-06-26 DIAGNOSIS — N201 Calculus of ureter: Secondary | ICD-10-CM | POA: Diagnosis present

## 2018-06-26 DIAGNOSIS — I745 Embolism and thrombosis of iliac artery: Secondary | ICD-10-CM | POA: Diagnosis present

## 2018-06-26 DIAGNOSIS — N12 Tubulo-interstitial nephritis, not specified as acute or chronic: Secondary | ICD-10-CM | POA: Diagnosis present

## 2018-06-26 DIAGNOSIS — Z888 Allergy status to other drugs, medicaments and biological substances status: Secondary | ICD-10-CM

## 2018-06-26 DIAGNOSIS — E8770 Fluid overload, unspecified: Secondary | ICD-10-CM | POA: Diagnosis not present

## 2018-06-26 LAB — I-STAT TROPONIN, ED: Troponin i, poc: 0 ng/mL (ref 0.00–0.08)

## 2018-06-26 LAB — COMPREHENSIVE METABOLIC PANEL
ALT: 14 U/L (ref 0–44)
AST: 19 U/L (ref 15–41)
Albumin: 2.8 g/dL — ABNORMAL LOW (ref 3.5–5.0)
Alkaline Phosphatase: 97 U/L (ref 38–126)
Anion gap: 19 — ABNORMAL HIGH (ref 5–15)
BUN: 48 mg/dL — ABNORMAL HIGH (ref 6–20)
CO2: 15 mmol/L — ABNORMAL LOW (ref 22–32)
Calcium: 10 mg/dL (ref 8.9–10.3)
Chloride: 95 mmol/L — ABNORMAL LOW (ref 98–111)
Creatinine, Ser: 5.87 mg/dL — ABNORMAL HIGH (ref 0.44–1.00)
GFR calc Af Amer: 9 mL/min — ABNORMAL LOW (ref >60)
GFR calc non Af Amer: 8 mL/min — ABNORMAL LOW (ref >60)
Glucose, Bld: 366 mg/dL — ABNORMAL HIGH (ref 70–99)
Potassium: 5.3 mmol/L — ABNORMAL HIGH (ref 3.5–5.1)
Sodium: 129 mmol/L — ABNORMAL LOW (ref 135–145)
Total Bilirubin: 0.6 mg/dL (ref 0.3–1.2)
Total Protein: 7.2 g/dL (ref 6.5–8.1)

## 2018-06-26 LAB — CBC
HCT: 38.5 % (ref 36.0–46.0)
Hemoglobin: 11.9 g/dL — ABNORMAL LOW (ref 12.0–15.0)
MCH: 28.4 pg (ref 26.0–34.0)
MCHC: 30.9 g/dL (ref 30.0–36.0)
MCV: 91.9 fL (ref 78.0–100.0)
Platelets: 351 10*3/uL (ref 150–400)
RBC: 4.19 MIL/uL (ref 3.87–5.11)
RDW: 16.7 % — ABNORMAL HIGH (ref 11.5–15.5)
WBC: 21.7 10*3/uL — ABNORMAL HIGH (ref 4.0–10.5)

## 2018-06-26 LAB — URINALYSIS, ROUTINE W REFLEX MICROSCOPIC

## 2018-06-26 LAB — I-STAT CHEM 8, ED
BUN: 50 mg/dL — ABNORMAL HIGH (ref 6–20)
Calcium, Ion: 1.25 mmol/L (ref 1.15–1.40)
Chloride: 101 mmol/L (ref 98–111)
Creatinine, Ser: 6.3 mg/dL — ABNORMAL HIGH (ref 0.44–1.00)
Glucose, Bld: 343 mg/dL — ABNORMAL HIGH (ref 70–99)
HCT: 32 % — ABNORMAL LOW (ref 36.0–46.0)
Hemoglobin: 10.9 g/dL — ABNORMAL LOW (ref 12.0–15.0)
Potassium: 4.8 mmol/L (ref 3.5–5.1)
Sodium: 132 mmol/L — ABNORMAL LOW (ref 135–145)
TCO2: 19 mmol/L — ABNORMAL LOW (ref 22–32)

## 2018-06-26 LAB — ECHOCARDIOGRAM COMPLETE

## 2018-06-26 LAB — I-STAT VENOUS BLOOD GAS, ED
Acid-base deficit: 13 mmol/L — ABNORMAL HIGH (ref 0.0–2.0)
Bicarbonate: 14.2 mmol/L — ABNORMAL LOW (ref 20.0–28.0)
O2 Saturation: 28 %
TCO2: 15 mmol/L — ABNORMAL LOW (ref 22–32)
pCO2, Ven: 35.4 mmHg — ABNORMAL LOW (ref 44.0–60.0)
pH, Ven: 7.211 — ABNORMAL LOW (ref 7.250–7.430)
pO2, Ven: 22 mmHg — CL (ref 32.0–45.0)

## 2018-06-26 LAB — PROTIME-INR
INR: 4.82
Prothrombin Time: 44.7 seconds — ABNORMAL HIGH (ref 11.4–15.2)

## 2018-06-26 LAB — URINALYSIS, MICROSCOPIC (REFLEX)
RBC / HPF: >50 RBC/hpf (ref 0–5)
WBC, UA: >50 WBC/hpf (ref 0–5)

## 2018-06-26 LAB — CBG MONITORING, ED: Glucose-Capillary: 214 mg/dL — ABNORMAL HIGH (ref 70–99)

## 2018-06-26 LAB — I-STAT BETA HCG BLOOD, ED (MC, WL, AP ONLY): I-stat hCG, quantitative: <5 m[IU]/mL (ref <5)

## 2018-06-26 LAB — LIPASE, BLOOD: Lipase: 15 U/L (ref 11–51)

## 2018-06-26 LAB — I-STAT CG4 LACTIC ACID, ED
Lactic Acid, Venous: 6.37 mmol/L (ref 0.5–1.9)
Lactic Acid, Venous: 4.36 mmol/L (ref 0.5–1.9)
Lactic Acid, Venous: 2.18 mmol/L (ref 0.5–1.9)

## 2018-06-26 MED ORDER — IOPAMIDOL (ISOVUE-370) INJECTION 76%
INTRAVENOUS | Status: AC
  Filled 2018-06-26: qty 100

## 2018-06-26 MED ORDER — VANCOMYCIN HCL 10 G IV SOLR
2000.0000 mg | Freq: Once | INTRAVENOUS | Status: AC
  Administered 2018-06-26: 2000 mg via INTRAVENOUS
  Filled 2018-06-26: qty 2000

## 2018-06-26 MED ORDER — ACETAMINOPHEN 325 MG PO TABS: 650.0000 mg | ORAL_TABLET | Freq: Four times a day (QID) | ORAL | Status: DC | PRN

## 2018-06-26 MED ORDER — SODIUM CHLORIDE 0.9 % IV BOLUS
500.0000 mL | Freq: Once | INTRAVENOUS | Status: AC
  Administered 2018-06-26: 500 mL via INTRAVENOUS

## 2018-06-26 MED ORDER — PANTOPRAZOLE SODIUM 40 MG PO TBEC
40.0000 mg | DELAYED_RELEASE_TABLET | Freq: Every day | ORAL | Status: DC
  Administered 2018-06-27 – 2018-07-05 (×9): 40 mg via ORAL
  Filled 2018-06-26 (×9): qty 1

## 2018-06-26 MED ORDER — SODIUM CHLORIDE 0.9% FLUSH
3.0000 mL | Freq: Two times a day (BID) | INTRAVENOUS | Status: DC
  Administered 2018-06-27 – 2018-07-03 (×10): 3 mL via INTRAVENOUS

## 2018-06-26 MED ORDER — FENTANYL CITRATE (PF) 100 MCG/2ML IJ SOLN
25.0000 ug | INTRAMUSCULAR | Status: DC | PRN
  Administered 2018-06-27 (×8): 50 ug via INTRAVENOUS
  Filled 2018-06-26 (×8): qty 2

## 2018-06-26 MED ORDER — VANCOMYCIN HCL 10 G IV SOLR: 1750.0000 mg | INTRAVENOUS | Status: DC

## 2018-06-26 MED ORDER — IOPAMIDOL (ISOVUE-370) INJECTION 76%
100.0000 mL | Freq: Once | INTRAVENOUS | Status: AC | PRN
  Administered 2018-06-26: 100 mL via INTRAVENOUS

## 2018-06-26 MED ORDER — SODIUM CHLORIDE 0.9 % IV SOLN: INTRAVENOUS | Status: DC

## 2018-06-26 MED ORDER — FENTANYL CITRATE (PF) 100 MCG/2ML IJ SOLN
50.0000 ug | INTRAMUSCULAR | Status: DC | PRN
  Filled 2018-06-26: qty 2

## 2018-06-26 MED ORDER — ONDANSETRON HCL 4 MG/2ML IJ SOLN
4.0000 mg | Freq: Four times a day (QID) | INTRAMUSCULAR | Status: DC | PRN
  Administered 2018-07-01: 4 mg via INTRAVENOUS
  Filled 2018-06-26: qty 2

## 2018-06-26 MED ORDER — INSULIN ASPART 100 UNIT/ML ~~LOC~~ SOLN
0.0000 [IU] | SUBCUTANEOUS | Status: DC
  Administered 2018-06-27: 1 [IU] via SUBCUTANEOUS
  Administered 2018-06-27: 3 [IU] via SUBCUTANEOUS
  Administered 2018-06-27 – 2018-06-28 (×5): 2 [IU] via SUBCUTANEOUS
  Administered 2018-06-28: 1 [IU] via SUBCUTANEOUS
  Administered 2018-06-29: 3 [IU] via SUBCUTANEOUS
  Administered 2018-06-29 (×3): 2 [IU] via SUBCUTANEOUS
  Administered 2018-06-29 – 2018-07-01 (×5): 1 [IU] via SUBCUTANEOUS
  Administered 2018-07-01 (×2): 2 [IU] via SUBCUTANEOUS
  Administered 2018-07-02 (×4): 1 [IU] via SUBCUTANEOUS
  Administered 2018-07-02: 2 [IU] via SUBCUTANEOUS
  Administered 2018-07-02 – 2018-07-03 (×2): 1 [IU] via SUBCUTANEOUS
  Administered 2018-07-03 (×2): 2 [IU] via SUBCUTANEOUS
  Administered 2018-07-03: 3 [IU] via SUBCUTANEOUS
  Administered 2018-07-03 (×2): 1 [IU] via SUBCUTANEOUS
  Administered 2018-07-03 – 2018-07-04 (×2): 2 [IU] via SUBCUTANEOUS
  Administered 2018-07-04: 5 [IU] via SUBCUTANEOUS
  Administered 2018-07-04 – 2018-07-05 (×5): 2 [IU] via SUBCUTANEOUS
  Administered 2018-07-05: 5 [IU] via SUBCUTANEOUS
  Administered 2018-07-05: 2 [IU] via SUBCUTANEOUS

## 2018-06-26 MED ORDER — ACETAMINOPHEN 650 MG RE SUPP: 650.0000 mg | Freq: Four times a day (QID) | RECTAL | Status: DC | PRN

## 2018-06-26 MED ORDER — ALBUTEROL SULFATE (2.5 MG/3ML) 0.083% IN NEBU: 2.5000 mg | INHALATION_SOLUTION | Freq: Four times a day (QID) | RESPIRATORY_TRACT | Status: DC | PRN

## 2018-06-26 MED ORDER — HYDROMORPHONE HCL 1 MG/ML IJ SOLN: 0.5000 mg | Freq: Once | INTRAMUSCULAR | Status: DC

## 2018-06-26 MED ORDER — ONDANSETRON HCL 4 MG PO TABS: 4.0000 mg | ORAL_TABLET | Freq: Four times a day (QID) | ORAL | Status: DC | PRN

## 2018-06-26 MED ORDER — VANCOMYCIN HCL IN DEXTROSE 1-5 GM/200ML-% IV SOLN: 1000.0000 mg | Freq: Once | INTRAVENOUS | Status: DC

## 2018-06-26 MED ORDER — BUPROPION HCL ER (SR) 150 MG PO TB12
150.0000 mg | ORAL_TABLET | Freq: Two times a day (BID) | ORAL | Status: DC
  Administered 2018-06-27 – 2018-07-05 (×17): 150 mg via ORAL
  Filled 2018-06-26 (×18): qty 1

## 2018-06-26 MED ORDER — SODIUM CHLORIDE 0.9 % IV BOLUS
1000.0000 mL | Freq: Once | INTRAVENOUS | Status: AC
  Administered 2018-06-26: 1000 mL via INTRAVENOUS

## 2018-06-26 MED ORDER — GABAPENTIN 100 MG PO CAPS
200.0000 mg | ORAL_CAPSULE | Freq: Two times a day (BID) | ORAL | Status: DC
  Administered 2018-06-27 – 2018-07-05 (×17): 200 mg via ORAL
  Filled 2018-06-26 (×17): qty 2

## 2018-06-26 MED ORDER — SODIUM CHLORIDE 0.9 % IV SOLN
10.0000 mL/h | Freq: Once | INTRAVENOUS | Status: AC
  Administered 2018-06-26: 10 mL/h via INTRAVENOUS

## 2018-06-26 MED ORDER — SODIUM CHLORIDE 0.9 % IV BOLUS
1200.0000 mL | Freq: Once | INTRAVENOUS | Status: AC
  Administered 2018-06-26: 1200 mL via INTRAVENOUS

## 2018-06-26 MED ORDER — MEGESTROL ACETATE 40 MG PO TABS
80.0000 mg | ORAL_TABLET | Freq: Every day | ORAL | Status: DC
  Administered 2018-06-27 – 2018-07-03 (×7): 80 mg via ORAL
  Filled 2018-06-26 (×8): qty 2

## 2018-06-26 MED ORDER — SODIUM CHLORIDE 0.9 % IV BOLUS
1000.0000 mL | Freq: Once | INTRAVENOUS | Status: AC
  Administered 2018-06-27: 1000 mL via INTRAVENOUS

## 2018-06-26 MED ORDER — DEXTROSE 5 % IV SOLN: 500.0000 mg | INTRAVENOUS | Status: DC

## 2018-06-26 MED ORDER — FENTANYL CITRATE (PF) 100 MCG/2ML IJ SOLN
50.0000 ug | INTRAMUSCULAR | Status: DC | PRN
  Administered 2018-06-26: 50 ug via INTRAVENOUS

## 2018-06-26 MED ORDER — HYDROXYZINE HCL 25 MG PO TABS
25.0000 mg | ORAL_TABLET | Freq: Three times a day (TID) | ORAL | Status: DC | PRN
  Administered 2018-06-27: 50 mg via ORAL
  Filled 2018-06-26: qty 2

## 2018-06-26 MED ORDER — ONDANSETRON HCL 4 MG/2ML IJ SOLN
4.0000 mg | Freq: Once | INTRAMUSCULAR | Status: AC
  Administered 2018-06-26: 4 mg via INTRAVENOUS
  Filled 2018-06-26: qty 2

## 2018-06-26 MED ORDER — INSULIN GLARGINE 100 UNIT/ML ~~LOC~~ SOLN
30.0000 [IU] | Freq: Every day | SUBCUTANEOUS | Status: DC
  Administered 2018-06-27 – 2018-07-04 (×8): 30 [IU] via SUBCUTANEOUS
  Filled 2018-06-26 (×10): qty 0.3

## 2018-06-26 MED ORDER — SODIUM CHLORIDE 0.9 % IV SOLN
2.0000 g | Freq: Once | INTRAVENOUS | Status: AC
  Administered 2018-06-26: 2 g via INTRAVENOUS
  Filled 2018-06-26: qty 2

## 2018-06-26 NOTE — Progress Notes (Signed)
2D Echocardiogram has been performed.  Terri Sparks 06/26/2018, 3:52 PM

## 2018-06-26 NOTE — Consult Note (Addendum)
NAME:  Terri Sparks, MRN:  [id number], DOB:  02-Jul-1976, LOS: 0 ADMISSION DATE:  06/26/2018, CONSULTATION DATE:  06/26/18 REFERRING MD:  Tomi Bamberger, EDP CHIEF COMPLAINT:  Right flank pain, dyspnea   Brief History   [name] is a 42 y.o. female who was admitted 9/23 with bilateral pyelonephritis.  Initially hypotensive and slow to respond to IVF's; therefore, PCCM consulted.  By the time of PCCM eval, had responded well; therefore, admitted to SDU by [facility].  Significant Hospital Events   9/23 > Admit.  Consults: date of consult/date signed off & final recs:  PCCM 9/23  Urology 9/23  Procedures (surgical and bedside):  None.  Significant Diagnostic Tests:  CTA chest / abd / pelv 9/23 > negative for PE or dissection, but did demonstrate bilateral pyelonephritis with emphysematous pyelitis, bilateral renal calculi with 42m stone in proximal right ureter, mild right hydronephrosis.  Micro Data: Blood 9/23 >  Urine 9/23 >   Antimicrobials:  Vanc 9/23 >  Cefepime 9/23 >    Subjective:  Has flank pain R > L.  Asking for water or ice chips. Also asking for pain medication.  Had dilaudid ordered but pt refused as she states last time she was given this, she developed asystole. Fentanyl given instead.  Objective   Blood pressure 132/76, pulse (!) 102, temperature 98.4 F (36.9 C), temperature source Oral, resp. rate 17, SpO2 100 %.        Intake/Output Summary (Last 24 hours) at 06/26/2018 2130 Last data filed at 06/26/2018 17017Gross per 24 hour  Intake 2506.65 ml  Output -  Net 2506.65 ml   There were no vitals filed for this visit.  Examination: General: Adult female, resting in bed, in NAD. Neuro: A&O x 3, no deficits. HEENT: [place]/AT. Sclerae anicteric, EOMI.  MM dry. Cardiovascular: RRR, no M/R/G.  Lungs: Respirations even and unlabored.  CTA bilaterally, No W/R/R. Abdomen: Obese, BS x 4, soft, NT/ND.  Musculoskeletal: No gross deformities, no edema.  Skin: Intact,  warm, no rashes.  Assessment & Plan:   Sepsis - due to bilateral pyelonephritis with bilateral renal calculi and mild right hydro.  A repeat sepsis assessment has been performed. Plan: Continue IVF's. Continue empiric abx and follow cultures. Urology consulted by EDP, might need stenting. Fentanyl + acetaminophen PRN pain.  Hyponatremia - presumed due to hypovolemia. AGMA - lactate + renal failure. Plan: Continue IVF's. Repeat lactate. Follow BMP.  Rest per primary team.  Nothing further to add.  PCCM will sign off.  Please do not hesitate to call [email or web address] if we can be of any further assistance.   Disposition / Summary of Today's Plan 06/26/18   SDU under [facility] Continue fluids, abx. Urology consulted.   Labs   CBC: Recent Labs  Lab 06/26/18 1305 06/26/18 1629  WBC 21.7*  --   HGB 11.9* 10.9*  HCT 38.5 32.0*  MCV 91.9  --   PLT 351  --    Basic Metabolic Panel: Recent Labs  Lab 06/26/18 1305 06/26/18 1629  NA 129* 132*  K 5.3* 4.8  CL 95* 101  CO2 15*  --   GLUCOSE 366* 343*  BUN 48* 50*  CREATININE 5.87* 6.30*  CALCIUM 10.0  --    GFR: Estimated Creatinine Clearance: 15.8 mL/min (A) (by C-G formula based on SCr of 6.3 mg/dL (H)). Recent Labs  Lab 06/26/18 1305 06/26/18 1503 06/26/18 1630 06/26/18 2035  WBC 21.7*  --   --   --  LATICACIDVEN  --  6.37* 4.36* 2.18*   Liver Function Tests: Recent Labs  Lab 06/26/18 1305  AST 19  ALT 14  ALKPHOS 97  BILITOT 0.6  PROT 7.2  ALBUMIN 2.8*   Recent Labs  Lab 06/26/18 1305  LIPASE 15   No results for input(s): AMMONIA in the last 168 hours. ABG    Component Value Date/Time   HCO3 14.2 (L) 06/26/2018 1535   TCO2 19 (L) 06/26/2018 1629   ACIDBASEDEF 13.0 (H) 06/26/2018 1535   O2SAT 28.0 06/26/2018 1535    Coagulation Profile: Recent Labs  Lab 06/26/18 1448  INR 4.82*   Cardiac Enzymes: No results for input(s): CKTOTAL, CKMB, CKMBINDEX, TROPONINI in the last 168  hours. HbA1C: Hemoglobin A1C  Date/Time Value Ref Range Status  01/27/2018 11:55 AM 10.3  Final  08/01/2017 11:27 AM 11.6  Final   HbA1c, POC (controlled diabetic range)  Date/Time Value Ref Range Status  06/20/2018 02:26 PM 8.7 (A) 0.0 - 7.0 % Final   Hgb A1c MFr Bld  Date/Time Value Ref Range Status  02/25/2017 08:21 AM 14.0 (H) 4.8 - 5.6 % Final    Comment:    (NOTE)         Pre-diabetes: 5.7 - 6.4         Diabetes: >6.4         Glycemic control for adults with diabetes: <7.0    CBG: Recent Labs  Lab 06/26/18 2115  GLUCAP 214*    Admitting History of Present Illness.    Meron Terri Sparks is a 42 y.o. female who  has a past medical history of Anxiety, Diabetes ([place]), GERD (gastroesophageal reflux disease), History of blood clots, Hypercalcemia, Hyperparathyroidism, Nephrolithiasis, and Renal calculi.  She presented to [facility] ED on 06/26/18 with dyspnea, b/l flank pain, nausea, vomiting, decreased PO intake x 1 day.  She states she hadn't been taking as much solid foods or liquids the past few days; however, this was worse 1 day prior to presentation due to flank pain along with nausea and vomiting.  Symptoms progressed and she also developed dyspnea; therefore, on 9/23, decided to come to ED for further evaluation.  In ED, she was hypotensive and there was concern for PE (hx of DVT) vs dissection.  She had echo that was suboptimal but did show possible changes of dissection; therefore, CTA chest/abd/pelv was obtained (delayed due to AKI).  This was negative for PE or dissection, but did demonstrate bilateral pyelonephritis with emphysematous pyelitis, bilateral renal calculi with 83m stone in proximal right ureter, mild right hydronephrosis.  She received empiric abx and 379mkg of NS bolus; however, BP was slow to respond.  PCCM was subsequently called in consultation.  At the time of our evaluation, pt had responded to IVF and had SBP in 120s - 130s.  Repeat lactate also shows  clearance (2.1 down from 4.36).  Pt complaining of b/l flank pain R > L.  No chest pain or dyspnea at this time.  She is asking for water or ice chips.  Given improvement in hemodynamics, it was felt that she was stable and suitable [facility] Review of Systems:   All negative; except for those that are bolded, which indicate positives.  Constitutional: weight loss, weight gain, sweats, fevers, chills, fatigue, weakness.  HEENT: headaches, sore throat, sneezing, nasal congestion, post nasal drip, difficulty swallowing, tooth/dental problems, visual complaints, visual changes, ear aches. Neuro: difficulty with speech, weakness, numbness, ataxia. CV:  chest  pain, orthopnea, PND, swelling in lower extremities, dizziness, palpitations, syncope.  Resp: cough, hemoptysis, dyspnea, wheezing. GI: heartburn, indigestion, abdominal pain, nausea, vomiting, diarrhea, constipation, change in bowel habits, loss of appetite, hematemesis, melena, hematochezia. GU: dysuria, change in color of urine, urgency or frequency, flank pain, hematuria. MSK: joint pain or swelling, decreased range of motion. Psych: change in mood or affect, depression, anxiety, suicidal ideations, homicidal ideations. Skin: rash, itching, bruising.   Past medical history  She,  has a past medical history of Anxiety, Diabetes ([place]), GERD (gastroesophageal reflux disease), History of blood clots, Hypercalcemia, Hyperparathyroidism, Nephrolithiasis, and Renal calculi.   Surgical History    Past Surgical History:  Procedure Laterality Date  . ABDOMINAL AORTOGRAM W/LOWER EXTREMITY N/A 11/14/2017   Procedure: ABDOMINAL AORTOGRAM W/LOWER EXTREMITY;  Surgeon: Waynetta Sandy, MD;  Location: [place] [facility];  Service: Cardiovascular;  Laterality: N/A;  . ANGIOPLASTY ILLIAC ARTERY Left 02/25/2018   Procedure: BALLOON ANGIOPLASTY LEFT ILIAC ARTERY;  Surgeon: Conrad Lennox, MD;  Location: [place];  Service: Vascular;   Laterality: Left;  . APPLICATION OF WOUND VAC Left 03/03/2018   Procedure: APPLICATION OF WOUND VAC;  Surgeon: Angelia Mould, MD;  Location: [place];  Service: Vascular;  Laterality: Left;  . EMBOLECTOMY Left 02/24/2017   Procedure: Left Lower Extremity Embolectomy and Angiogram.;  Surgeon: Waynetta Sandy, MD;  Location: [place];  Service: Vascular;  Laterality: Left;  . INSERTION OF ILIAC STENT  02/24/2017   Procedure: INSERTION OF Common ILIAC STENT;  Surgeon: Waynetta Sandy, MD;  Location: [place];  Service: Vascular;;  . INTRAOPERATIVE ARTERIOGRAM Left 02/25/2018   Procedure: INTRA OPERATIVE ARTERIOGRAM WITH LEFT LEG RUNOFF;  Surgeon: Conrad Lueders, MD;  Location: [place];  Service: Vascular;  Laterality: Left;  . LOWER EXTREMITY ANGIOGRAM Left 02/24/2017   Procedure: Aortagram, Left lower extremity Run-off;  Surgeon: Waynetta Sandy, MD;  Location: [place];  Service: Vascular;  Laterality: Left;  . PATCH ANGIOPLASTY Left 02/25/2018   Procedure: PATCH ANGIOPLASTY LEFT SUPERFICIAL FEMORAL ARTERY WITH BOVINE PATCH;  Surgeon: Conrad Harahan, MD;  Location: [place];  Service: Vascular;  Laterality: Left;  . PERCUTANEOUS VENOUS THROMBECTOMY,LYSIS WITH INTRAVASCULAR ULTRASOUND (IVUS) Left 05/12/2018   Procedure: MECHANICAL THROMBECTOMY LEFT LEG, BALLOON ANGIOPLASTY LEFT ANTERIOR TIBIAL ARTERY, AORTOGRAM WITH LEFT LEG RUNOFF;  Surgeon: Serafina Mitchell, MD;  Location: [place];  Service: Vascular;  Laterality: Left;  . 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 THROMBECTOMY FEMORAL ARTERY Left 02/25/2018   Procedure: LEFT ILIAC AND POPLITEAL ARTERY THROMBECTOMY;  Surgeon: Conrad Bessemer, MD;  Location: [place];  Service: Vascular;  Laterality: Left;  . TUBAL LIGATION    . WOUND DEBRIDEMENT Left 03/03/2018   Procedure: DEBRIDEMENT WOUND;  Surgeon: Angelia Mould, MD;  Location: [place];  Service: Vascular;  Laterality: Left;  . WOUND EXPLORATION Left 03/03/2018   Procedure: WOUND  EXPLORATION;  Surgeon: Angelia Mould, MD;  Location: [facility];  Service: Vascular;  Laterality: Left;     Social History   Social History   Socioeconomic History  . Marital status: Single    Spouse name: Not on file  . Number of children: Not on file  . Years of education: Not on file  . Highest education level: Not on file  Occupational History  . Not on file  Social Needs  . Financial resource strain: Not on file  . Food insecurity:    Worry: Not on file  Inability: Not on file  . Transportation needs:    Medical: Not on file    Non-medical: Not on file  Tobacco Use  . Smoking status: Current Some Day Smoker    Packs/day: 0.25    Years: 15.00    Pack years: 3.75    Types: Cigarettes    Last attempt to quit: 03/04/2017    Years since quitting: 1.3  . Smokeless tobacco: Never Used  Substance and Sexual Activity  . Alcohol use: No  . Drug use: Yes    Types: Marijuana    Comment: occasionally  . Sexual activity: Yes    Partners: Male    Birth control/protection: Other-see comments    Comment: BTL  Lifestyle  . Physical activity:    Days per week: Not on file    Minutes per session: Not on file  . Stress: Not on file  Relationships  . Social connections:    Talks on phone: Not on file    Gets together: Not on file    Attends religious service: Not on file    Active member of club or organization: Not on file    Attends meetings of clubs or organizations: Not on file    Relationship status: Not on file  . Intimate partner violence:    Fear of current or ex partner: Not on file    Emotionally abused: Not on file    Physically abused: Not on file    Forced sexual activity: Not on file  Other Topics Concern  . Not on file  Social History Narrative  . Not on file  ,  reports that she has been smoking cigarettes. She has a 3.75 pack-year smoking history. She has never used smokeless tobacco. She reports that she has current or past drug history. Drug:  Marijuana. She reports that she does not drink alcohol.   Family history   Her family history includes Depression in her mother; Diabetes in her father; Heart failure in her father.   Allergies Allergies  Allergen Reactions  . Ciprofloxacin Hcl Hives  . Macrobid [Nitrofurantoin Macrocrystal] Hives, Shortness Of Breath and Rash  . Other Hives, Shortness Of Breath and Rash    NO "-CILLINS"!!!  . Penicillins Shortness Of Breath    Had had cephalosporins without incident Has patient had a PCN reaction causing immediate rash, facial/tongue/throat swelling, SOB or lightheadedness with hypotension: Yes Has patient had a PCN reaction causing severe rash involving mucus membranes or skin necrosis: Unk Has patient had a PCN reaction that required hospitalization: Unk Has patient had a PCN reaction occurring within the last 10 years: No If all of the above answers are "NO", then may proceed with Cephalosporin use.   . Sulfa Antibiotics Hives, Shortness Of Breath and Rash  . Dilaudid [Hydromorphone Hcl] Other (See Comments)    Asystole per pt report  . Lexapro [Escitalopram Oxalate] Other (See Comments)    "I just did not like it."    Home meds  Prior to Admission medications   Medication Sig Start Date End Date Taking? Authorizing Provider  acetaminophen (TYLENOL) 500 MG tablet Take 1,000 mg by mouth every 6 (six) hours as needed (for pain).    Yes [provider]  albuterol (PROAIR HFA) 108 (90 Base) MCG/ACT inhaler Inhale 2 puffs into the lungs every 6 (six) hours as needed for wheezing or shortness of breath.   Yes [provider]  aspirin EC 81 MG tablet Take 81 mg by mouth daily.  Yes [provider]  buPROPion (BUPROBAN) 150 MG 12 hr tablet Take 1 tablet (150 mg total) by mouth 2 (two) times daily. 01/27/18  Yes Ladell Pier, MD  diphenhydrAMINE (BENADRYL) 25 MG tablet Take 25 mg by mouth every 6 (six) hours as needed for allergies.    Yes [provider]  docusate sodium (COLACE) 100 MG capsule Take 100 mg by mouth daily.   Yes [provider]  ferrous sulfate 325 (65 FE) MG tablet Take 1 tablet (325 mg total) by mouth daily with breakfast. 01/31/18  Yes Ladell Pier, MD  gabapentin (NEURONTIN) 300 MG capsule Take 2 capsules (600 mg total) by mouth 2 (two) times daily. 06/20/18  Yes Ladell Pier, MD  hydrOXYzine (ATARAX/VISTARIL) 25 MG tablet TAKE 1 TABLET BY MOUTH ONCE IN THE MORNING, 1 TABLET AT NOON, AND 2 TABLETS IN THE EVENING AS NEEDED Patient taking differently: Take 25-50 mg by mouth See admin instructions. Take 25 mg by mouth in the morning then 25 mg at noon then 50 mg at bedtime as needed for anxiety or itching 06/08/18  Yes Ladell Pier, MD  insulin aspart (NOVOLOG FLEXPEN) 100 UNIT/ML FlexPen 5-15 units subcut TID with meals Patient taking differently: Inject 5-15 Units into the skin 3 (three) times daily with meals.  06/20/18  Yes Ladell Pier, MD  Insulin Glargine (LANTUS SOLOSTAR) 100 UNIT/ML Solostar Pen Inject 70 Units into the skin daily. Patient taking differently: Inject 70 Units into the skin at bedtime.  06/20/18  Yes Ladell Pier, MD  liraglutide (VICTOZA) 18 MG/3ML SOPN Inject 0.1 mLs (0.6 mg total) into the skin daily. 03/14/18  Yes Ladell Pier, MD  lisinopril (PRINIVIL,ZESTRIL) 5 MG tablet Take 0.5 tablets (2.5 mg total) by mouth daily. 09/29/17  Yes Ladell Pier, MD  megestrol (MEGACE) 40 MG tablet Take 2 tablets (80 mg total) by mouth daily. 06/15/18  Yes Truett Mainland, DO  naproxen sodium (ALEVE) 220 MG tablet Take 220-440 mg by mouth 2 (two) times daily as needed (for pain).   Yes [provider]  omeprazole (PRILOSEC) 20 MG capsule Take 2 capsules (40 mg total) by mouth daily. Patient taking differently: Take 40 mg by mouth 2 (two) times daily as needed (for reflux).  05/29/18  Yes Ladell Pier, MD  traMADol (ULTRAM) 50 MG tablet Take 1 tablet  (50 mg total) by mouth daily as needed. Patient taking differently: Take 50 mg by mouth daily as needed (for pain).  06/20/18  Yes Ladell Pier, MD  warfarin (COUMADIN) 5 MG tablet Take 1 tablet (5 mg total) by mouth See admin instructions. 5 mg Sun, Mon , wed,  Fri 10 mg true, Thurs sat Patient taking differently: Take 5-10 mg by mouth See admin instructions. Take 5 mg by mouth at 4 PM daily on Sun/Mon/Wed/Fri and 10 mg on Tues/Thurs/Sat 05/18/18  Yes Ulyses Amor, PA-C  blood glucose meter kit and supplies Dispense based on patient and insurance preference. Use up to four times daily as directed. (FOR ICD-9 250.00, 250.01). 03/03/17   Cristal Ford, DO  Blood Glucose Monitoring Suppl (TRUE METRIX METER) w/Device KIT Use as directed 08/01/17   Ladell Pier, MD  dexamethasone (DECADRON) 1 MG tablet 1 tab PO at 11 p.m night before lab test. 06/23/18   Ladell Pier, MD  glucose blood (TRUE METRIX BLOOD GLUCOSE TEST) test strip Use as instructed 08/01/17   Ladell Pier, MD  glucose blood test strip Use as instructed 10/10/16   Muthersbaugh, Jarrett Soho, PA-C  Insulin Pen Needle 31G X 5 MM MISC Use with Lantus and Novolog injections. 06/20/18   Ladell Pier, MD  Insulin Syringe-Needle U-100 (BD INSULIN SYRINGE ULTRAFINE) 31G X 5/16" 0.5 ML MISC Use as directed 04/28/17   Ladell Pier, MD  nicotine (NICODERM CQ - DOSED IN MG/24 HOURS) 14 mg/24hr patch Place 1 patch (14 mg total) onto the skin daily. Patient not taking: Reported on 06/26/2018 04/28/17   Ladell Pier, MD  ondansetron (ZOFRAN) 4 MG tablet Take 1 tablet (4 mg total) by mouth every 8 (eight) hours as needed for nausea or vomiting. Patient not taking: Reported on 06/26/2018 03/24/18   Ulyses Amor, PA-C  TRUEPLUS LANCETS 28G MISC Use as directed 08/01/17   Ladell Pier, MD    Montey Hora, [facility] Pager: [phone]  or [phone] 06/26/2018, 9:30  PM

## 2018-06-26 NOTE — ED Provider Notes (Signed)
Patient care assumed from Providence Lanius, [place] at [phone].  Please refer to her note for complete history and physical exam.  Complaint: Shortness of breath and right flank pain  Plan: Patient is currently undergoing work-up for aortic dissection versus pulmonary embolism.  Patient pending CT a PE study.  Follow-up orders.  Regardless of decision, patient to be admitted for AKI.  CT Angio Chest/Abd/Pel for Dissection W and/or Wo Contrast  Final Result    DG Chest 2 View  Final Result    CT Angio Aortobifemoral W and/or Wo Contrast    (Results Pending)     CTA negative for PE.  Patient with emphysematous right-sided pyelonephritis.  Patient persists with borderline maps of 66.  Patient's 30 cc/kg fluid bolus filled out, patient given vancomycin dosing.  Patient with optic shock secondary to pyelonephritis not currently requiring vasopressors.  Patient admitted to ICU for further management care.       Keenan Bachelor, MD 06/26/18 Lattie Corns    Dorie Rank, MD 06/26/18 [phone]

## 2018-06-26 NOTE — ED Notes (Signed)
Venous Blood Gas PO2 of 22 reported to Dr.Messick.

## 2018-06-26 NOTE — ED Notes (Signed)
MD at bedside. 

## 2018-06-26 NOTE — ED Notes (Signed)
Going back to Ct scan with patient

## 2018-06-26 NOTE — ED Notes (Addendum)
I-Stat Lactic Acid results of 6.37 reported to Dr. Francia Greaves.

## 2018-06-26 NOTE — ED Triage Notes (Signed)
Pt in c/o shortness of breath, n/v and flank pain for two days, no distress noted

## 2018-06-26 NOTE — ED Provider Notes (Signed)
[facility] EMERGENCY DEPARTMENT Provider Note   CSN: [id number] Arrival date & time: 06/26/18  1240     History   Chief Complaint Chief Complaint  Patient presents with  . Shortness of Breath    HPI Terri Sparks is a 42 y.o. female with PMH/o anxiety, DM, GERD, LLE ischemia who presents for evaluation of shortness of breath that began last night.  Patient reports that since last night, symptoms have gotten progressively worse.  She states that she has no history of COPD or asthma.  She states that she can barely walk a few steps without getting short of breath.  Reports she has been having some left-sided flank pain and chest pain that wraps around to her back that began today.  History of left lower extremity with any blood clots is currently on Coumadin.  She states she has been compliant and has not skipped a dose.  Patient also reports that for the last 2 weeks, she has had some hematuria.  She states that she has been evaluated for issues with her kidney but reports no acute issues noted.  She reports that hematuria has gotten worse.  Reports that last night, she was having frequent urination with hematuria. Patient denies any recent fevers.  She denies any history of blood clots in her lungs.  Patient denies any recent long travel.    The history is provided by the patient.    Past Medical History:  Diagnosis Date  . Anxiety   . Diabetes ([place])   . GERD (gastroesophageal reflux disease)   . History of blood clots   . Hypercalcemia   . Hyperparathyroidism   . Nephrolithiasis   . Renal calculi     Patient Active Problem List   Diagnosis Date Noted  . Adrenal nodule ([place]) 06/21/2018  . Lung nodules 06/21/2018  . Abnormal uterine bleeding (AUB) 05/14/2018  . Hemorrhagic cyst of right ovary 05/14/2018  . Acute blood loss anemia 05/14/2018  . Ischemia of extremity 05/12/2018  . Former smoker 03/14/2018  . 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 Thromboembolism ([place]) 02/25/2018  . Microalbuminuria 09/29/2017  . Insomnia 09/29/2017  . Immunization due 08/01/2017  . Anxiety and depression 04/28/2017  . 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 Tobacco abuse 02/25/2017  . Diabetes mellitus, type II ([place]) 02/25/2017  . Peripheral vascular disease of lower extremity ([place]) 02/24/2017  . GERD (gastroesophageal reflux disease)   . Hyperparathyroidism     Past Surgical History:  Procedure Laterality Date  . ABDOMINAL AORTOGRAM W/LOWER EXTREMITY N/A 11/14/2017   Procedure: ABDOMINAL AORTOGRAM W/LOWER EXTREMITY;  Surgeon: Waynetta Sandy, MD;  Location: [facility];  Service: Cardiovascular;  Laterality: N/A;  . ANGIOPLASTY ILLIAC ARTERY Left 02/25/2018   Procedure: BALLOON ANGIOPLASTY LEFT ILIAC ARTERY;  Surgeon: Conrad Ridgeway, MD;  Location: [facility];  Service: Vascular;  Laterality: Left;  . APPLICATION OF WOUND VAC Left 03/03/2018   Procedure: APPLICATION OF WOUND VAC;  Surgeon: Angelia Mould, MD;  Location: [facility] [facility];  Service: Vascular;  Laterality: Left;  . EMBOLECTOMY Left 02/24/2017   Procedure: Left Lower Extremity Embolectomy and Angiogram.;  Surgeon: Waynetta Sandy, MD;  Location: [facility];  Service: Vascular;  Laterality: Left;  . INSERTION OF ILIAC STENT  02/24/2017   Procedure: INSERTION OF Common ILIAC STENT;  Surgeon: Waynetta Sandy, MD;  Location: [facility];  Service: Vascular;;  .  INTRAOPERATIVE ARTERIOGRAM Left 02/25/2018   Procedure: INTRA OPERATIVE ARTERIOGRAM WITH LEFT LEG RUNOFF;  Surgeon: Conrad [place], MD;  Location: [facility];  Service: Vascular;  Laterality: Left;  . LOWER EXTREMITY ANGIOGRAM Left 02/24/2017   Procedure: Aortagram, Left lower extremity Run-off;  Surgeon: Waynetta Sandy, MD;  Location: [place];  Service: Vascular;  Laterality: Left;  . PATCH ANGIOPLASTY Left 02/25/2018   Procedure: PATCH  ANGIOPLASTY LEFT SUPERFICIAL FEMORAL ARTERY WITH BOVINE PATCH;  Surgeon: Conrad Keystone, MD;  Location: [facility];  Service: Vascular;  Laterality: Left;  . PERCUTANEOUS VENOUS THROMBECTOMY,LYSIS WITH INTRAVASCULAR ULTRASOUND (IVUS) Left 05/12/2018   Procedure: MECHANICAL THROMBECTOMY LEFT LEG, BALLOON ANGIOPLASTY LEFT ANTERIOR TIBIAL ARTERY, AORTOGRAM WITH LEFT LEG RUNOFF;  Surgeon: Serafina Mitchell, MD;  Location: [facility];  Service: Vascular;  Laterality: Left;  . 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 THROMBECTOMY FEMORAL ARTERY Left 02/25/2018   Procedure: LEFT ILIAC AND POPLITEAL ARTERY THROMBECTOMY;  Surgeon: Conrad [place], MD;  Location: [facility];  Service: Vascular;  Laterality: Left;  . TUBAL LIGATION    . WOUND DEBRIDEMENT Left 03/03/2018   Procedure: DEBRIDEMENT WOUND;  Surgeon: Angelia Mould, MD;  Location: [facility];  Service: Vascular;  Laterality: Left;  . WOUND EXPLORATION Left 03/03/2018   Procedure: WOUND EXPLORATION;  Surgeon: Angelia Mould, MD;  [facility];  Service: Vascular;  Laterality: Left;     OB History   None      Home Medications    Prior to Admission medications   Medication Sig Start Date End Date Taking? Authorizing Provider  acetaminophen (TYLENOL) 500 MG tablet Take 1,000 mg by mouth every 6 (six) hours as needed (for pain).    Yes [provider]  albuterol (PROAIR HFA) 108 (90 Base) MCG/ACT inhaler Inhale 2 puffs into the lungs every 6 (six) hours as needed for wheezing or shortness of breath.   Yes [provider]  aspirin EC 81 MG tablet Take 81 mg by mouth daily.   Yes [provider]  buPROPion (BUPROBAN) 150 MG 12 hr tablet Take 1 tablet (150 mg total) by mouth 2 (two) times daily. 01/27/18  Yes Ladell Pier, MD  diphenhydrAMINE (BENADRYL) 25 MG tablet Take 25 mg by mouth every 6 (six) hours as needed for allergies.    Yes [provider]  docusate sodium (COLACE) 100 MG capsule Take 100 mg by mouth  daily.   Yes [provider]  ferrous sulfate 325 (65 FE) MG tablet Take 1 tablet (325 mg total) by mouth daily with breakfast. 01/31/18  Yes Ladell Pier, MD  gabapentin (NEURONTIN) 300 MG capsule Take 2 capsules (600 mg total) by mouth 2 (two) times daily. 06/20/18  Yes Ladell Pier, MD  hydrOXYzine (ATARAX/VISTARIL) 25 MG tablet TAKE 1 TABLET BY MOUTH ONCE IN THE MORNING, 1 TABLET AT NOON, AND 2 TABLETS IN THE EVENING AS NEEDED Patient taking differently: Take 25-50 mg by mouth See admin instructions. Take 25 mg by mouth in the morning then 25 mg at noon then 50 mg at bedtime as needed for anxiety or itching 06/08/18  Yes Ladell Pier, MD  insulin aspart (NOVOLOG FLEXPEN) 100 UNIT/ML FlexPen 5-15 units subcut TID with meals Patient taking differently: Inject 5-15 Units into the skin 3 (three) times daily with meals.  06/20/18  Yes Ladell Pier, MD  Insulin Glargine (LANTUS SOLOSTAR) 100 UNIT/ML Solostar Pen Inject 70 Units into the skin daily. Patient taking  differently: Inject 70 Units into the skin at bedtime.  06/20/18  Yes Ladell Pier, MD  liraglutide (VICTOZA) 18 MG/3ML SOPN Inject 0.1 mLs (0.6 mg total) into the skin daily. 03/14/18  Yes Ladell Pier, MD  lisinopril (PRINIVIL,ZESTRIL) 5 MG tablet Take 0.5 tablets (2.5 mg total) by mouth daily. 09/29/17  Yes Ladell Pier, MD  megestrol (MEGACE) 40 MG tablet Take 2 tablets (80 mg total) by mouth daily. 06/15/18  Yes Truett Mainland, DO  naproxen sodium (ALEVE) 220 MG tablet Take 220-440 mg by mouth 2 (two) times daily as needed (for pain).   Yes [provider]  omeprazole (PRILOSEC) 20 MG capsule Take 2 capsules (40 mg total) by mouth daily. Patient taking differently: Take 40 mg by mouth 2 (two) times daily as needed (for reflux).  05/29/18  Yes Ladell Pier, MD  traMADol (ULTRAM) 50 MG tablet Take 1 tablet (50 mg total) by mouth daily as needed. Patient taking differently: Take 50  mg by mouth daily as needed (for pain).  06/20/18  Yes Ladell Pier, MD  warfarin (COUMADIN) 5 MG tablet Take 1 tablet (5 mg total) by mouth See admin instructions. 5 mg Sun, Mon , wed,  Fri 10 mg true, Thurs sat Patient taking differently: Take 5-10 mg by mouth See admin instructions. Take 5 mg by mouth at 4 PM daily on Sun/Mon/Wed/Fri and 10 mg on Tues/Thurs/Sat 05/18/18  Yes Ulyses Amor, PA-C  blood glucose meter kit and supplies Dispense based on patient and insurance preference. Use up to four times daily as directed. (FOR ICD-9 250.00, 250.01). 03/03/17   Cristal Ford, DO  Blood Glucose Monitoring Suppl (TRUE METRIX METER) w/Device KIT Use as directed 08/01/17   Ladell Pier, MD  dexamethasone (DECADRON) 1 MG tablet 1 tab PO at 11 p.m night before lab test. 06/23/18   Ladell Pier, MD  glucose blood (TRUE METRIX BLOOD GLUCOSE TEST) test strip Use as instructed 08/01/17   Ladell Pier, MD  glucose blood test strip Use as instructed 10/10/16   Muthersbaugh, Jarrett Soho, PA-C  Insulin Pen Needle 31G X 5 MM MISC Use with Lantus and Novolog injections. 06/20/18   Ladell Pier, MD  Insulin Syringe-Needle U-100 (BD INSULIN SYRINGE ULTRAFINE) 31G X 5/16" 0.5 ML MISC Use as directed 04/28/17   Ladell Pier, MD  nicotine (NICODERM CQ - DOSED IN MG/24 HOURS) 14 mg/24hr patch Place 1 patch (14 mg total) onto the skin daily. Patient not taking: Reported on 06/26/2018 04/28/17   Ladell Pier, MD  ondansetron (ZOFRAN) 4 MG tablet Take 1 tablet (4 mg total) by mouth every 8 (eight) hours as needed for nausea or vomiting. Patient not taking: Reported on 06/26/2018 03/24/18   Ulyses Amor, PA-C  TRUEPLUS LANCETS 28G MISC Use as directed 08/01/17   Ladell Pier, MD    Family History Family History  Problem Relation Age of Onset  . Depression Mother   . Diabetes Father   . Heart failure Father     Social History Social History   Tobacco Use  . Smoking status:  Current Some Day Smoker    Packs/day: 0.25    Years: 15.00    Pack years: 3.75    Types: Cigarettes    Last attempt to quit: 03/04/2017    Years since quitting: 1.3  . Smokeless tobacco: Never Used  Substance Use Topics  . Alcohol use: No  . Drug use: Yes  Types: Marijuana    Comment: occasionally     Allergies   Ciprofloxacin hcl; Macrobid [nitrofurantoin macrocrystal]; Other; Penicillins; Sulfa antibiotics; and Lexapro [escitalopram oxalate]   Review of Systems Review of Systems  Constitutional: Negative for fever.  Respiratory: Positive for shortness of breath. Negative for cough.   Cardiovascular: Positive for chest pain. Negative for leg swelling.  Gastrointestinal: Positive for nausea. Negative for abdominal pain and vomiting.  Genitourinary: Positive for flank pain and hematuria. Negative for dysuria.  Neurological: Negative for headaches.  All other systems reviewed and are negative.    Physical Exam Updated Vital Signs BP 92/78   Pulse (!) 117   Temp 98 F (36.7 C) (Oral)   Resp 17   SpO2 100%   Physical Exam  Constitutional: She is oriented to person, place, and time. She appears well-developed and well-nourished.  Appears uncomfortable  HENT:  Head: Normocephalic and atraumatic.  Mouth/Throat: Oropharynx is clear and moist and mucous membranes are normal.  Eyes: Pupils are equal, round, and reactive to light. Conjunctivae, EOM and lids are normal.  Neck: Full passive range of motion without pain.  Cardiovascular: Normal heart sounds. An irregularly irregular rhythm present. Tachycardia present. Exam reveals no gallop and no friction rub.  No murmur heard. Initially on difficulty obtaining palpable left DP and PT pulse.  PT pulse obtained by Doppler.  Patient initially with 2+ bilateral radial pulses.  Patient started experiencing some numbness and hand started having diminished pulse in left upper extremity that was obtainable by Doppler.    Pulmonary/Chest: Breath sounds normal. Tachypnea noted.  Increased work of breathing.  Tachypnea noted.  Speaking in short sentences.  Lungs clear to auscultation bilaterally.  Abdominal: Soft. Normal appearance. There is tenderness in the left lower quadrant. There is CVA tenderness (left). There is no rigidity and no guarding.  Musculoskeletal: Normal range of motion.  Neurological: She is alert and oriented to person, place, and time.  Subjective numbness noted to the LLE  Skin: Skin is warm and dry.  Left lower foot is slightly pale in appearance with delayed cap refill.  PT pulse evaluated by Doppler.  Psychiatric: She has a normal mood and affect. Her speech is normal.  Nursing note and vitals reviewed.     ED Treatments / Results  Labs (all labs ordered are listed, but only abnormal results are displayed) Labs Reviewed  COMPREHENSIVE METABOLIC PANEL - Abnormal; Notable for the following components:      Result Value   Sodium 129 (*)    Potassium 5.3 (*)    Chloride 95 (*)    CO2 15 (*)    Glucose, Bld 366 (*)    BUN 48 (*)    Creatinine, Ser 5.87 (*)    Albumin 2.8 (*)    GFR calc non Af Amer 8 (*)    GFR calc Af Amer 9 (*)    Anion gap 19 (*)    All other components within normal limits  CBC - Abnormal; Notable for the following components:   WBC 21.7 (*)    Hemoglobin 11.9 (*)    RDW 16.7 (*)    All other components within normal limits  URINALYSIS, ROUTINE W REFLEX MICROSCOPIC - Abnormal; Notable for the following components:   Color, Urine RED (*)    APPearance TURBID (*)    Glucose, UA   (*)    Value: TEST NOT REPORTED DUE TO COLOR INTERFERENCE OF URINE PIGMENT   Hgb urine dipstick   (*)  Value: TEST NOT REPORTED DUE TO COLOR INTERFERENCE OF URINE PIGMENT   Bilirubin Urine   (*)    Value: TEST NOT REPORTED DUE TO COLOR INTERFERENCE OF URINE PIGMENT   Ketones, ur   (*)    Value: TEST NOT REPORTED DUE TO COLOR INTERFERENCE OF URINE PIGMENT   Protein, ur    (*)    Value: TEST NOT REPORTED DUE TO COLOR INTERFERENCE OF URINE PIGMENT   Nitrite   (*)    Value: TEST NOT REPORTED DUE TO COLOR INTERFERENCE OF URINE PIGMENT   Leukocytes, UA   (*)    Value: TEST NOT REPORTED DUE TO COLOR INTERFERENCE OF URINE PIGMENT   All other components within normal limits  URINALYSIS, MICROSCOPIC (REFLEX) - Abnormal; Notable for the following components:   Bacteria, UA MANY (*)    All other components within normal limits  PROTIME-INR - Abnormal; Notable for the following components:   Prothrombin Time 44.7 (*)    INR 4.82 (*)    All other components within normal limits  I-STAT CHEM 8, ED - Abnormal; Notable for the following components:   Sodium 132 (*)    BUN 50 (*)    Creatinine, Ser 6.30 (*)    Glucose, Bld 343 (*)    TCO2 19 (*)    Hemoglobin 10.9 (*)    HCT 32.0 (*)    All other components within normal limits  I-STAT CG4 LACTIC ACID, ED - Abnormal; Notable for the following components:   Lactic Acid, Venous 6.37 (*)    All other components within normal limits  I-STAT VENOUS BLOOD GAS, ED - Abnormal; Notable for the following components:   pH, Ven 7.211 (*)    pCO2, Ven 35.4 (*)    pO2, Ven 22.0 (*)    Bicarbonate 14.2 (*)    TCO2 15 (*)    Acid-base deficit 13.0 (*)    All other components within normal limits  I-STAT CG4 LACTIC ACID, ED - Abnormal; Notable for the following components:   Lactic Acid, Venous 4.36 (*)    All other components within normal limits  CULTURE, BLOOD (ROUTINE X 2)  CULTURE, BLOOD (ROUTINE X 2)  LIPASE, BLOOD  BLOOD GAS, VENOUS  I-STAT BETA HCG BLOOD, ED ([facility], [facility], [facility] ONLY)  I-STAT TROPONIN, ED  PREPARE FRESH FROZEN PLASMA  PREPARE FRESH FROZEN PLASMA    EKG EKG Interpretation  Date/Time:  Monday June 26 2018 12:44:00 EDT Ventricular Rate:  139 PR Interval:  120 QRS Duration: 74 QT Interval:  284 QTC Calculation: 432 R Axis:   65 Text Interpretation:  Sinus tachycardia with Premature  supraventricular complexes Nonspecific T wave abnormality Abnormal ECG Confirmed by Dene Gentry [phone]) on 06/26/2018 3:01:28 PM   Radiology Dg Chest 2 View  Result Date: 06/26/2018 CLINICAL DATA:  shortness of breath, n/v and rt flank pain for two days, weak,hx hematuria EXAM: CHEST - 2 VIEW COMPARISON:  None. FINDINGS: The heart size and mediastinal contours are within normal limits. Both lungs are clear. The visualized skeletal structures are unremarkable. IMPRESSION: No active cardiopulmonary disease. Electronically Signed   By: Nolon Nations M.D.   On: 06/26/2018 13:38    Procedures .Critical Care Performed by: Volanda Napoleon, PA-C Authorized by: Volanda Napoleon, PA-C   Critical care provider statement:    Critical care time (minutes):  45   Critical care was necessary to treat or prevent imminent or life-threatening deterioration of the following conditions:  Cardiac failure, renal failure,  respiratory failure, circulatory failure, sepsis, dehydration and shock   Critical care was time spent personally by me on the following activities:  Discussions with consultants, evaluation of patient's response to treatment, examination of patient, ordering and performing treatments and interventions, ordering and review of laboratory studies, ordering and review of radiographic studies, pulse oximetry, re-evaluation of patient's condition, obtaining history from patient or surrogate and review of old charts   (including critical care time)  Medications Ordered in ED Medications  iopamidol (ISOVUE-370) 76 % injection (has no administration in time range)  vancomycin (VANCOCIN) 2,000 mg in sodium chloride 0.9 % 500 mL IVPB (has no administration in time range)  iopamidol (ISOVUE-370) 76 % injection (has no administration in time range)  ceFEPIme (MAXIPIME) 2 g in sodium chloride 0.9 % 100 mL IVPB (2 g Intravenous New Bag/Given 06/26/18 1808)  0.9 %  sodium chloride infusion (has no  administration in time range)  sodium chloride 0.9 % bolus 1,000 mL (0 mLs Intravenous Stopped 06/26/18 1558)  sodium chloride 0.9 % bolus 1,000 mL (0 mLs Intravenous Stopped 06/26/18 1559)  iopamidol (ISOVUE-370) 76 % injection 100 mL (100 mLs Intravenous Contrast Given 06/26/18 1650)     Initial Impression / Assessment and Plan / ED Course  I have reviewed the triage vital signs and the nursing notes.  Pertinent labs & imaging results that were available during my care of the patient were reviewed by me and considered in my medical decision making (see chart for details).     42 year old female who presents for evaluation of shortness of breath began last night and is progressively worsening.  Also reports she has had some gross hematuria.  States she has been having some mild hematuria over the last few weeks but last night, got worse.  Additionally, she is having some pain in the left side of her chest and into her back that radiates down into her left flank.  She is a history of blood clots in her left lower extremity.  Is currently on Coumadin which she states she has been compliant with.  On initial ED arrival, patient is afebrile, tachycardic, hypotensive, tachypneic.   I-STAT lactic 6.37.  PT/INR 4.82.  VBG shows pH of 7.211, bicarb of 35, oxygen 22.  I-STAT beta negative.  UA is unable to assess as there is gross hematuria.  CBC shows leukocytosis of 21.7.  Hemoglobin 11.9.  CMP shows potassium 5.3, bicarb 15, glucose of 366.  Additionally, BUN is 48, creatinine 5.87.  Anion gap is 19.  Unclear if this is infectious etiology versus metabolic acidosis.   CT chest, abdomen pelvis ordered for evaluation of PE versus dissection.  CT tech called and stated that given patient's elevated creatinine status, cannot perform CT abdomen pelvis.  Explained the severity of the situation and concern for dissection.  She will discuss with radiology.  CT tech discussed with radiology.  They recommended  not doing the CT study.  They recommend obtaining VQ scan for evaluation of PE, echo for evaluation of dissection.  3:08 PM: Discussed with Trish (Cardiology). Will have ECHO done at bedside ASAP.  CMP shows sodium of 129, potassium 5.3, bicarb 15, glucose of 366.  BUN and creatinine elevated at 48 and 5.7 respectively.  Discussed patient with Dr. Cathie Olden (Cardiology) regarding patient's echo.  He states that the echo is not optimal quality but states that there is concern that there is a dissection.  Given patient's history/physical exam, he recommends obtaining CT scan for evaluation  of dissection.  Additionally, given patient's supratherapeutic INR, he recommends giving FFP to reverse her INR.  Dr. Francia Greaves discussed with Dr. Ilda Foil (radiology).  He is agreeable to scan as long as we discussed risk first benefits with patient.  With Dr. Francia Greaves and I discussed at patient bedside regarding risk first benefits of obtaining CT study to evaluate for dissection versus PE.  We discussed that this could send patient into renal failure given her new onset elevation in creatinine and BUN.  Patient understands risk first benefits and wishes to proceed with CT scan.  RN informed me that CT tech did not want to perform the CT scan. I discussed with CT tech and Dr. Ilda Foil (Radiology).  He agrees that given patient's hemodynamic instability, MRI would not be best choice.  He is advises that I discussed with patient regarding risk first benefits.  I discussed with patient again and she wished to proceed with CTA scan for evaluation.  CT notified me that given patient's IV locations, they cannot adequately obtain imaging.  IV team consulted to get bilateral ACs.  Patient signed out to Leta Speller, MD CTA pending.  Anticipate admission regardless given patient's hemodynamically instability and AKI.   Final Clinical Impressions(s) / ED Diagnoses   Final diagnoses:  Acute kidney injury [facility])  Septic shock  [facility])  Pyelonephritis    ED Discharge Orders    None       Desma Mcgregor 06/27/18 1743    Valarie Merino, MD 06/30/18 [phone]

## 2018-06-26 NOTE — H&P (Addendum)
History and Physical    Terri Sparks NTI:[id number] DOB: Jun 10, 1976 DOA: 06/26/2018  PCP: Ladell Pier, MD   Patient coming from: Home   Chief Complaint: Right flank pain, cold sweats, N/V   HPI: Terri Sparks is a 42 y.o. 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  Patient reports that she developed pain in the right flank and gross hematuria almost a week ago, but otherwise felt well until yesterday when she developed cold sweats with nausea, nonbloody vomiting, and general malaise.  Symptoms progressed and she eventually sought evaluation in the ED this evening.  She denies chest pain, cough, or rash.  ED Course: Upon arrival to the ED, patient is found to be afebrile, saturating well on room air, tachypneic, tachycardic in the 140s, and hypertensive.  EKG features sinus tachycardia with rate 139 and PVCs.  Chest x-ray is negative for acute cardiopulmonary disease.  CT angiogram of the chest/abdomen/pelvis was notable for bilateral pyelonephritis and emphysematous pyelitis with bilateral renal calculi and 6 mm stone in the proximal right ureter with mild right-sided hydronephrosis.  Chemistry panel is notable for sodium 129, potassium 5.3, bicarbonate 15, glucose 366, and creatinine of 5.87.  CBC features a leukocytosis to 21,700.  INR is elevated 4.82.  Lactic acid is elevated to 6.37.  Blood cultures were collected, 30 cc/kg bolus of normal saline administered, and the patient was started on empiric vancomycin and cefepime.  Critical care evaluated the patient in the ED and felt her suitable for stepdown bed.  Urology was consulted by the ED physician and recommends reversal of INR and consultation with  IR for placement of percutaneous nephrostomy tube.  Blood pressure improved with fluid resuscitation and the patient will be admitted for ongoing evaluation and management of severe sepsis secondary to pyelonephritis complicated by right ureteral stone.  Review of Systems:  All other systems reviewed and apart from HPI, are negative.  Past Medical History:  Diagnosis Date  . Anxiety   . Diabetes ([place])   . GERD (gastroesophageal reflux disease)   . History of blood clots   . Hypercalcemia   . Hyperparathyroidism   . Nephrolithiasis   . Renal calculi     Past Surgical History:  Procedure Laterality Date  . ABDOMINAL AORTOGRAM W/LOWER EXTREMITY N/A 11/14/2017   Procedure: ABDOMINAL AORTOGRAM W/LOWER EXTREMITY;  Surgeon: Waynetta Sandy, MD;  Location: [facility];  Service: Cardiovascular;  Laterality: N/A;  . ANGIOPLASTY ILLIAC ARTERY Left 02/25/2018   Procedure: BALLOON ANGIOPLASTY LEFT ILIAC ARTERY;  Surgeon: Conrad [place], MD;  Location: [place];  Service: Vascular;  Laterality: Left;  . APPLICATION OF WOUND VAC Left 03/03/2018   Procedure: APPLICATION OF WOUND VAC;  Surgeon: Angelia Mould, MD;  Location: [place];  Service: Vascular;  Laterality: Left;  . EMBOLECTOMY Left 02/24/2017   Procedure: Left Lower Extremity Embolectomy and Angiogram.;  Surgeon: Waynetta Sandy, MD;  Location: [place];  Service: Vascular;  Laterality: Left;  . INSERTION OF ILIAC STENT  02/24/2017   Procedure: INSERTION OF Common ILIAC STENT;  Surgeon: Waynetta Sandy, MD;  Location: [place];  Service: Vascular;;  . INTRAOPERATIVE ARTERIOGRAM Left 02/25/2018   Procedure: INTRA OPERATIVE ARTERIOGRAM WITH LEFT LEG RUNOFF;  Surgeon: Conrad [place], MD;  Location: [facility];  Service: Vascular;  Laterality: Left;  . LOWER EXTREMITY ANGIOGRAM Left 02/24/2017   Procedure: Aortagram, Left lower extremity Run-off;  Surgeon: Waynetta Sandy, MD;  Location: [place];  Service: Vascular;   Laterality: Left;  . PATCH ANGIOPLASTY Left 02/25/2018   Procedure: PATCH ANGIOPLASTY LEFT SUPERFICIAL FEMORAL ARTERY WITH BOVINE PATCH;  Surgeon: Conrad [place], MD;  Location: [place];  Service: Vascular;  Laterality: Left;  . PERCUTANEOUS VENOUS THROMBECTOMY,LYSIS WITH INTRAVASCULAR ULTRASOUND (IVUS) Left 05/12/2018   Procedure: MECHANICAL THROMBECTOMY LEFT LEG, BALLOON ANGIOPLASTY LEFT ANTERIOR TIBIAL ARTERY, AORTOGRAM WITH LEFT LEG RUNOFF;  Surgeon: Serafina Mitchell, MD;  Location: [place];  Service: Vascular;  Laterality: Left;  . 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 THROMBECTOMY FEMORAL ARTERY Left 02/25/2018   Procedure: LEFT ILIAC AND POPLITEAL ARTERY THROMBECTOMY;  Surgeon: Conrad [place]lyn, MD;  Location: [place];  Service: Vascular;  Laterality: Left;  . TUBAL LIGATION    . WOUND DEBRIDEMENT Left 03/03/2018   Procedure: DEBRIDEMENT WOUND;  Surgeon: Angelia Mould, MD;  Location: [place];  Service: Vascular;  Laterality: Left;  . WOUND EXPLORATION Left 03/03/2018   Procedure: WOUND EXPLORATION;  Surgeon: Angelia Mould, MD;  Location: [place];  Service: Vascular;  Laterality: Left;     reports that she has been smoking cigarettes. She has a 3.75 pack-year smoking history. She has never used smokeless tobacco. She reports that she has current or past drug history. Drug: Marijuana. She reports that she does not drink alcohol.  Allergies  Allergen Reactions  . Ciprofloxacin Hcl Hives  . Macrobid [Nitrofurantoin Macrocrystal] Hives, Shortness Of Breath and Rash  . Other Hives, Shortness Of Breath and Rash    NO "-CILLINS"!!!  . Penicillins Shortness Of Breath    Had had cephalosporins without incident Has patient had a PCN reaction causing immediate rash, facial/tongue/throat swelling, SOB or lightheadedness with hypotension: Yes Has patient had a PCN reaction causing severe rash involving mucus membranes or skin necrosis: Unk Has patient had a PCN reaction that required  hospitalization: Unk Has patient had a PCN reaction occurring within the last 10 years: No If all of the above answers are "NO", then may proceed with Cephalosporin use.   . Sulfa Antibiotics Hives, Shortness Of Breath and Rash  . Dilaudid [Hydromorphone Hcl] Other (See Comments)    Asystole per pt report  . Lexapro [Escitalopram Oxalate] Other (See Comments)    "I just did not like it."    Family History  Problem Relation Age of Onset  . Depression Mother   . Diabetes Father   . Heart failure Father      Prior to Admission medications   Medication Sig Start Date End Date Taking? Authorizing Provider  acetaminophen (TYLENOL) 500 MG tablet Take 1,000 mg by mouth every 6 (six) hours as needed (for pain).    Yes [provider]  albuterol (PROAIR HFA) 108 (90 Base) MCG/ACT inhaler Inhale 2 puffs into the lungs every 6 (six) hours as needed for wheezing or shortness of breath.   Yes [provider]  aspirin EC 81 MG tablet Take 81 mg by mouth daily.   Yes [provider]  buPROPion (BUPROBAN) 150 MG 12 hr tablet Take 1 tablet (150 mg total) by mouth 2 (two) times daily. 01/27/18  Yes Ladell Pier, MD  diphenhydrAMINE (BENADRYL) 25 MG tablet Take 25 mg by mouth every 6 (six) hours as needed for allergies.    Yes  [provider]  docusate sodium (COLACE) 100 MG capsule Take 100 mg by mouth daily.   Yes [provider]  ferrous sulfate 325 (65 FE) MG tablet Take 1 tablet (325 mg total) by mouth daily with breakfast. 01/31/18  Yes Ladell Pier, MD  gabapentin (NEURONTIN) 300 MG capsule Take 2 capsules (600 mg total) by mouth 2 (two) times daily. 06/20/18  Yes Ladell Pier, MD  hydrOXYzine (ATARAX/VISTARIL) 25 MG tablet TAKE 1 TABLET BY MOUTH ONCE IN THE MORNING, 1 TABLET AT NOON, AND 2 TABLETS IN THE EVENING AS NEEDED Patient taking differently: Take 25-50 mg by mouth See admin instructions. Take 25 mg by mouth in the morning then 25  mg at noon then 50 mg at bedtime as needed for anxiety or itching 06/08/18  Yes Ladell Pier, MD  insulin aspart (NOVOLOG FLEXPEN) 100 UNIT/ML FlexPen 5-15 units subcut TID with meals Patient taking differently: Inject 5-15 Units into the skin 3 (three) times daily with meals.  06/20/18  Yes Ladell Pier, MD  Insulin Glargine (LANTUS SOLOSTAR) 100 UNIT/ML Solostar Pen Inject 70 Units into the skin daily. Patient taking differently: Inject 70 Units into the skin at bedtime.  06/20/18  Yes Ladell Pier, MD  liraglutide (VICTOZA) 18 MG/3ML SOPN Inject 0.1 mLs (0.6 mg total) into the skin daily. 03/14/18  Yes Ladell Pier, MD  lisinopril (PRINIVIL,ZESTRIL) 5 MG tablet Take 0.5 tablets (2.5 mg total) by mouth daily. 09/29/17  Yes Ladell Pier, MD  megestrol (MEGACE) 40 MG tablet Take 2 tablets (80 mg total) by mouth daily. 06/15/18  Yes Truett Mainland, DO  naproxen sodium (ALEVE) 220 MG tablet Take 220-440 mg by mouth 2 (two) times daily as needed (for pain).   Yes [provider]  omeprazole (PRILOSEC) 20 MG capsule Take 2 capsules (40 mg total) by mouth daily. Patient taking differently: Take 40 mg by mouth 2 (two) times daily as needed (for reflux).  05/29/18  Yes Ladell Pier, MD  traMADol (ULTRAM) 50 MG tablet Take 1 tablet (50 mg total) by mouth daily as needed. Patient taking differently: Take 50 mg by mouth daily as needed (for pain).  06/20/18  Yes Ladell Pier, MD  warfarin (COUMADIN) 5 MG tablet Take 1 tablet (5 mg total) by mouth See admin instructions. 5 mg Sun, Mon , wed,  Fri 10 mg true, Thurs sat Patient taking differently: Take 5-10 mg by mouth See admin instructions. Take 5 mg by mouth at 4 PM daily on Sun/Mon/Wed/Fri and 10 mg on Tues/Thurs/Sat 05/18/18  Yes Ulyses Amor, PA-C  blood glucose meter kit and supplies Dispense based on patient and insurance preference. Use up to four times daily as directed. (FOR ICD-9 250.00, 250.01). 03/03/17    Cristal Ford, DO  Blood Glucose Monitoring Suppl (TRUE METRIX METER) w/Device KIT Use as directed 08/01/17   Ladell Pier, MD  dexamethasone (DECADRON) 1 MG tablet 1 tab PO at 11 p.m night before lab test. 06/23/18   Ladell Pier, MD  glucose blood (TRUE METRIX BLOOD GLUCOSE TEST) test strip Use as instructed 08/01/17   Ladell Pier, MD  glucose blood test strip Use as instructed 10/10/16   Muthersbaugh, Jarrett Soho, PA-C  Insulin Pen Needle 31G X 5 MM MISC Use with Lantus and Novolog injections. 06/20/18   Ladell Pier, MD  Insulin Syringe-Needle U-100 (BD INSULIN SYRINGE ULTRAFINE) 31G X 5/16" 0.5 ML MISC Use as directed 04/28/17  Ladell Pier, MD  nicotine (NICODERM CQ - DOSED IN MG/24 HOURS) 14 mg/24hr patch Place 1 patch (14 mg total) onto the skin daily. Patient not taking: Reported on 06/26/2018 04/28/17   Ladell Pier, MD  ondansetron (ZOFRAN) 4 MG tablet Take 1 tablet (4 mg total) by mouth every 8 (eight) hours as needed for nausea or vomiting. Patient not taking: Reported on 06/26/2018 03/24/18   Ulyses Amor, PA-C  TRUEPLUS LANCETS 28G MISC Use as directed 08/01/17   Ladell Pier, MD    Physical Exam: Vitals:   06/26/18 1915 06/26/18 1930 06/26/18 1945 06/26/18 2030  BP: 92/75 (!) 63/23 92/66 132/76  Pulse:  (!) 57 (!) 117 (!) 102  Resp: (!) 39 (!) 25 (!) 23 17  Temp:      TempSrc:      SpO2:  100% 100% 100%    Constitutional: NAD, pale Eyes: PERTLA, lids and conjunctivae normal ENMT: Mucous membranes are moist. Posterior pharynx clear of any exudate or lesions.   Neck: normal, supple, no masses, no thyromegaly Respiratory: clear to auscultation bilaterally, no wheezing, no crackles. Normal respiratory effort. No accessory muscle use.  Cardiovascular: Rate ~120 and regular. No extremity edema. 2+ pedal pulses.   Abdomen: No distension, soft, mild generalized tenderness. Bowel sounds active.  Musculoskeletal: no clubbing / cyanosis. No  joint deformity upper and lower extremities.   Skin: no significant rashes, lesions, ulcers. Warm, dry, well-perfused. Neurologic: CN 2-12 grossly intact. Sensation intact. Strength 5/5 in all 4 limbs.  Psychiatric: Alert and oriented x 3. Calm, cooperative.    Labs on Admission: I have personally reviewed following labs and imaging studies  CBC: Recent Labs  Lab 06/26/18 1305 06/26/18 1629  WBC 21.7*  --   HGB 11.9* 10.9*  HCT 38.5 32.0*  MCV 91.9  --   PLT 351  --    Basic Metabolic Panel: Recent Labs  Lab 06/26/18 1305 06/26/18 1629  NA 129* 132*  K 5.3* 4.8  CL 95* 101  CO2 15*  --   GLUCOSE 366* 343*  BUN 48* 50*  CREATININE 5.87* 6.30*  CALCIUM 10.0  --    GFR: Estimated Creatinine Clearance: 15.8 mL/min (A) (by C-G formula based on SCr of 6.3 mg/dL (H)). Liver Function Tests: Recent Labs  Lab 06/26/18 1305  AST 19  ALT 14  ALKPHOS 97  BILITOT 0.6  PROT 7.2  ALBUMIN 2.8*   Recent Labs  Lab 06/26/18 1305  LIPASE 15   No results for input(s): AMMONIA in the last 168 hours. Coagulation Profile: Recent Labs  Lab 06/26/18 1448  INR 4.82*   Cardiac Enzymes: No results for input(s): CKTOTAL, CKMB, CKMBINDEX, TROPONINI in the last 168 hours. BNP (last 3 results) No results for input(s): PROBNP in the last 8760 hours. HbA1C: No results for input(s): HGBA1C in the last 72 hours. CBG: Recent Labs  Lab 06/26/18 2115  GLUCAP 214*   Lipid Profile: No results for input(s): CHOL, HDL, LDLCALC, TRIG, CHOLHDL, LDLDIRECT in the last 72 hours. Thyroid Function Tests: No results for input(s): TSH, T4TOTAL, FREET4, T3FREE, THYROIDAB in the last 72 hours. Anemia Panel: No results for input(s): VITAMINB12, FOLATE, FERRITIN, TIBC, IRON, RETICCTPCT in the last 72 hours. Urine analysis:    Component Value Date/Time   COLORURINE RED (A) 06/26/2018 1317   APPEARANCEUR TURBID (A) 06/26/2018 1317   LABSPEC  06/26/2018 1317    TEST NOT REPORTED DUE TO COLOR  INTERFERENCE OF URINE PIGMENT  PHURINE  06/26/2018 1317    TEST NOT REPORTED DUE TO COLOR INTERFERENCE OF URINE PIGMENT   GLUCOSEU (A) 06/26/2018 1317    TEST NOT REPORTED DUE TO COLOR INTERFERENCE OF URINE PIGMENT   HGBUR (A) 06/26/2018 1317    TEST NOT REPORTED DUE TO COLOR INTERFERENCE OF URINE PIGMENT   BILIRUBINUR (A) 06/26/2018 1317    TEST NOT REPORTED DUE TO COLOR INTERFERENCE OF URINE PIGMENT   BILIRUBINUR negative 09/29/2017 1317   KETONESUR (A) 06/26/2018 1317    TEST NOT REPORTED DUE TO COLOR INTERFERENCE OF URINE PIGMENT   PROTEINUR (A) 06/26/2018 1317    TEST NOT REPORTED DUE TO COLOR INTERFERENCE OF URINE PIGMENT   UROBILINOGEN 0.2 06/15/2018 1530   NITRITE (A) 06/26/2018 1317    TEST NOT REPORTED DUE TO COLOR INTERFERENCE OF URINE PIGMENT   LEUKOCYTESUR (A) 06/26/2018 1317    TEST NOT REPORTED DUE TO COLOR INTERFERENCE OF URINE PIGMENT   Sepsis Labs: '@LABRCNTIP'$ (procalcitonin:4,lacticidven:4) )No results found for this or any previous visit (from the past 240 hour(s)).   Radiological Exams on Admission: Dg Chest 2 View  Result Date: 06/26/2018 CLINICAL DATA:  shortness of breath, n/v and rt flank pain for two days, weak,hx hematuria EXAM: CHEST - 2 VIEW COMPARISON:  None. FINDINGS: The heart size and mediastinal contours are within normal limits. Both lungs are clear. The visualized skeletal structures are unremarkable. IMPRESSION: No active cardiopulmonary disease. Electronically Signed   By: Nolon Nations M.D.   On: 06/26/2018 13:38   Ct Angio Chest/abd/pel For Dissection W And/or Wo Contrast  Result Date: 06/26/2018 CLINICAL DATA:  42 year old with shortness of breath. Evaluate for aortic dissection. EXAM: CT ANGIOGRAPHY CHEST, ABDOMEN AND PELVIS TECHNIQUE: Multidetector CT imaging through the chest, abdomen and pelvis was performed using the standard protocol during bolus administration of intravenous contrast. Multiplanar reconstructed images and MIPs were  obtained and reviewed to evaluate the vascular anatomy. CONTRAST:  118m ISOVUE-370 IOPAMIDOL (ISOVUE-370) INJECTION 76% COMPARISON:  05/12/2018 and chest CT 08/26/2014 FINDINGS: CTA CHEST FINDINGS Cardiovascular: Normal caliber of the thoracic aorta without dissection or intramural hematoma. Heart size is normal without significant pericardial fluid. Pulmonary arteries are not opacified with contrast. Typical aortic arch anatomy. Visualized great vessels are patent. Mediastinum/Nodes: Mediastinal structures are unremarkable. No evidence for mediastinal, hilar or axillary lymphadenopathy. Lungs/Pleura: Trachea and mainstem bronchi are patent. No large pleural effusions. Atelectasis in the right lower lobe. There are 2 stable nodular densities along the right major fissure since 2015. The largest measures 7 mm on sequence 11, image 62. These are compatible with benign findings based on the stability. Stable pleural-based nodularity along the anterior right lung on sequence 11, image 75. Nodular density along the left side of the heart on sequence 11, image 90 is probably unchanged since 2015. No large areas of airspace disease or lung consolidation. Musculoskeletal: No acute bone abnormality. Review of the MIP images confirms the above findings. CTA ABDOMEN AND PELVIS FINDINGS VASCULAR Aorta: Abdominal aorta is patent without dissection. Limited evaluation of the aorta due to poor contrast opacification and body habitus. Celiac: Celiac trunk and main branch vessels are patent. SMA: SMA is patent but limited evaluation. Renals: Flow in bilateral renal arteries but limited evaluation. IMA: Limited evaluation. Inflow: Again noted is a stent left common iliac artery. Left common iliac artery stent appears to be patent. There appears to be flow in the left external iliac artery. Limited evaluation of the right common iliac artery. There is flow in the  right external iliac artery. Surgical clips of the left groin. Flow in  the proximal bilateral femoral arteries but limited evaluation. Veins: No obvious venous abnormality within the limitations of this arterial phase study. Review of the MIP images confirms the above findings. NON-VASCULAR Hepatobiliary: Distention of the gallbladder. No gross abnormality to the liver but limited evaluation due to streak artifact. Pancreas: Unremarkable. No pancreatic ductal dilatation or surrounding inflammatory changes. Spleen: Normal in size without focal abnormality. Adrenals/Urinary Tract: Normal appearance of the right adrenal gland. There is stable fullness and nodularity in the left adrenal gland measuring up to 1.7 cm and compatible with an adenoma based on exam from 08/17/2015. There is new right perinephric edema. There is a new gas in the bilateral renal collecting systems. Decreased enhancement scattered throughout both kidneys, particularly the right kidney. Findings are suggestive for pyelonephritis. Focal pyelonephritis in the left kidney upper pole. There are several small bilateral renal calculi. There is extensive cortical thinning and scarring in the left kidney. There is now a small stone in the proximal right ureter measuring roughly 6 mm. There is new mild right hydronephrosis. One or two small stones in the right renal pelvis. Minimal dilatation of left renal pelvis. Stomach/Bowel: No acute inflammation involving the bowel structures. Stomach is unremarkable. Lymphatic: No significant lymph node enlargement. Reproductive: Stable appearance of the uterus and adnexal structures. Right adnexa is mildly prominent but poorly characterized on this examination. Other: Trace fluid in the pelvis.  Negative for free air. Musculoskeletal: No acute abnormality. Review of the MIP images confirms the above findings. IMPRESSION: 1. Bilateral pyelonephritis with emphysematous pyelitis. Gas within the bilateral renal collecting systems and urinary bladder. 2. Bilateral renal calculi with 6 mm  stone in the proximal right ureter. Mild right hydronephrosis. 3. Negative for an aortic dissection. Limited evaluation of the abdominopelvic vascular structures. The left common iliac artery stent appears to be patent. 4. Extensive scarring in the left kidney. These results were called by telephone at the time of interpretation on 06/26/2018 at 7:26 pm to Dr. Tomi Bamberger , who verbally acknowledged these results. Electronically Signed   By: Markus Daft M.D.   On: 06/26/2018 19:26    EKG: Independently reviewed. Sinus tachycardia (rate 139), PVC's.   Assessment/Plan  1. Severe sepsis secondary to pyelonephritis  - Presents with right flank pain, malaise, sweats, N/V, and is found to be hypotensive and tachycardic with WBC 21,700 and lactate 6.37  - Blood cultures collected, 30 cc/kg NS bolus given, and she was started on empiric vancomycin and cefepime  - CXR clear, urine is grossly bloody, and CT concerning for b/l pyelonephritis and gas within collecting systems  - Urology is consulting and much appreciated, recommending that IR place percutaneous nephrostomy tube once INR reversed; she was given 2 units FFP in ED and repeat INR pending - Continue IVF, empiric antibiotics, supportive care while following cultures and clinical course    2. Right ureteral stone  - 6 mm stone in proximal right ureter with associated mild hydronephrosis and pyelonephritis seen on CT  - Urology consulting with plan for IR to place nephrostomy tube as above   3. Left iliac artery thrombosis; supratherapeutic INR  - Patent on CTA in ED  - INR is 4.82 on presentation -> she was given 2 units FFP with plan of IR to place nephrostomy tube, repeat INR pending   4. Insulin-dependent DM  - A1c was 10.3% in April  - Managed at home with Lantus 70 units  qHS and Novolog 5-15 units TID  - Follow CBG's and start with reduced-dose Lantus and sensitive-scale SSI given renal failure and NPO status    5. History of hypertension  -  She is hypotensive in setting of sepsis and lisinopril held    6. Acute kidney injury superimposed on CKD III   - SCr is 5.87 on admission, up from 1.55 last month  - There is a right ureteral stone with mild right-sided hydronephrosis, but AKI more likely prerenal in setting of marked hypotension; ATN possible  - She was fluid-resuscitated in ED with 4 liters NS and urology was consulted, recommending that IR place percutaneous nephrostomy tube  - Renally-dose medications, avoid nephrotoxins, repeat chem panel in am   7. Hyponatremia  - Serum sodium is 129 on admission in setting of hypovolemia and marked hyperglycemia  - Continue IVF hydration and glycemic-control, repeat chem panel in am   8. Hyperkalemia  - Serum potassium was 5.3 initially in setting of AKI, resolved with fluid-resuscitation  - Continue cardiac monitoring and repeat chem panel in am   9. Metabolic acidosis with elevated AG - Secondary to sepsis and AKI  - Improving with treatment of underlying illness   DVT prophylaxis: warfarin  Code Status: Full  Family Communication: Discussed with patient Consults called: PCCM, urology Admission status: Inpatient     Vianne Bulls, MD [facility] Pager [phone]  If 7PM-7AM, please contact night-coverage [email or web address] Password [facility]  06/26/2018, 9:28 PM

## 2018-06-26 NOTE — ED Notes (Signed)
Spoke with Opy[place], MD about BP OF 87/38 MAP 52. MD instructed this nurse to finish bolus and see if MAP improves and to keep him updated. Will continue to monitor.

## 2018-06-26 NOTE — Consult Note (Signed)
Urology Consult   Physician requesting consult: Mitzi Hansen, MD  Reason for consult: Septic shock due to obstructing right UPJ stone  History of Present Illness: Terri Sparks is a 42 y.o. female with a history of kidney stones, peripheral vascular disease, DVT (on coumadin), type 2 DM,  HTN and obesity presents to the [facility] ED with a c/o SOB and right flank pain for the past 48 hours.  Her flank pain is constant, sharp, non-radiating with no modifying factors.  She had a CT angio today that showed bilateral emphysematous pyelitis and an obstructing 6 mm right UPJ calculus.  She has been hypotensive and tachycardic throughout the day while in the ED with systolic pressures in the 60s this evening.  Her blood pressure did temporarily respond to a fluid bolus and she had systolic pressures in the 110s upon my evaluation.     The patient has a long history of kidney stones and previously underwent multiple PCNLs with Dr. Felipa Eth.    Past Medical History:  Diagnosis Date  . Anxiety   . Diabetes ([place])   . GERD (gastroesophageal reflux disease)   . History of blood clots   . Hypercalcemia   . Hyperparathyroidism   . Nephrolithiasis   . Renal calculi     Past Surgical History:  Procedure Laterality Date  . ABDOMINAL AORTOGRAM W/LOWER EXTREMITY N/A 11/14/2017   Procedure: ABDOMINAL AORTOGRAM W/LOWER EXTREMITY;  Surgeon: Waynetta Sandy, MD;  Location: [facility];  Service: Cardiovascular;  Laterality: N/A;  . ANGIOPLASTY ILLIAC ARTERY Left 02/25/2018   Procedure: BALLOON ANGIOPLASTY LEFT ILIAC ARTERY;  Surgeon: Conrad Carlton, MD;  Location: [place];  Service: Vascular;  Laterality: Left;  . APPLICATION OF WOUND VAC Left 03/03/2018   Procedure: APPLICATION OF WOUND VAC;  Surgeon: Angelia Mould, MD;  Location: [place];  Service: Vascular;  Laterality: Left;  . EMBOLECTOMY Left 02/24/2017   Procedure: Left Lower Extremity Embolectomy and Angiogram.;  Surgeon: Waynetta Sandy, MD;  Location: [place];  Service: Vascular;  Laterality: Left;  . INSERTION OF ILIAC STENT  02/24/2017   Procedure: INSERTION OF Common ILIAC STENT;  Surgeon: Waynetta Sandy, MD;  Location: [place];  Service: Vascular;;  . INTRAOPERATIVE ARTERIOGRAM Left 02/25/2018   Procedure: INTRA OPERATIVE ARTERIOGRAM WITH LEFT LEG RUNOFF;  Surgeon: Conrad Stanchfield, MD;  Location: [place];  Service: Vascular;  Laterality: Left;  . LOWER EXTREMITY ANGIOGRAM Left 02/24/2017   Procedure: Aortagram, Left lower extremity Run-off;  Surgeon: Waynetta Sandy, MD;  Location: [place];  Service: Vascular;  Laterality: Left;  . PATCH ANGIOPLASTY Left 02/25/2018   Procedure: PATCH ANGIOPLASTY LEFT SUPERFICIAL FEMORAL ARTERY WITH BOVINE PATCH;  Surgeon: Conrad Travilah, MD;  Location: [place];  Service: Vascular;  Laterality: Left;  . PERCUTANEOUS VENOUS THROMBECTOMY,LYSIS WITH INTRAVASCULAR ULTRASOUND (IVUS) Left 05/12/2018   Procedure: MECHANICAL THROMBECTOMY LEFT LEG, BALLOON ANGIOPLASTY LEFT ANTERIOR TIBIAL ARTERY, AORTOGRAM WITH LEFT LEG RUNOFF;  Surgeon: Serafina Mitchell, MD;  Location: [place];  Service: Vascular;  Laterality: Left;  . 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 THROMBECTOMY FEMORAL ARTERY Left 02/25/2018   Procedure: LEFT ILIAC AND POPLITEAL ARTERY THROMBECTOMY;  Surgeon: Conrad , MD;  Location: [place];  Service: Vascular;  Laterality: Left;  . TUBAL LIGATION    . WOUND DEBRIDEMENT Left 03/03/2018   Procedure: DEBRIDEMENT WOUND;  Surgeon: Angelia Mould, MD;  Location: [place];  Service: Vascular;  Laterality: Left;  .  WOUND EXPLORATION Left 03/03/2018   Procedure: WOUND EXPLORATION;  Surgeon: Angelia Mould, MD;  Location: [place];  Service: Vascular;  Laterality: Left;    Current Hospital Medications:  Home Meds:  Current Meds  Medication Sig  . acetaminophen (TYLENOL) 500 MG tablet Take 1,000 mg by mouth every 6 (six) hours as needed (for pain).   Marland Kitchen albuterol (PROAIR  HFA) 108 (90 Base) MCG/ACT inhaler Inhale 2 puffs into the lungs every 6 (six) hours as needed for wheezing or shortness of breath.  Marland Kitchen aspirin EC 81 MG tablet Take 81 mg by mouth daily.  Marland Kitchen buPROPion (BUPROBAN) 150 MG 12 hr tablet Take 1 tablet (150 mg total) by mouth 2 (two) times daily.  . diphenhydrAMINE (BENADRYL) 25 MG tablet Take 25 mg by mouth every 6 (six) hours as needed for allergies.   Marland Kitchen docusate sodium (COLACE) 100 MG capsule Take 100 mg by mouth daily.  . ferrous sulfate 325 (65 FE) MG tablet Take 1 tablet (325 mg total) by mouth daily with breakfast.  . gabapentin (NEURONTIN) 300 MG capsule Take 2 capsules (600 mg total) by mouth 2 (two) times daily.  . hydrOXYzine (ATARAX/VISTARIL) 25 MG tablet TAKE 1 TABLET BY MOUTH ONCE IN THE MORNING, 1 TABLET AT NOON, AND 2 TABLETS IN THE EVENING AS NEEDED (Patient taking differently: Take 25-50 mg by mouth See admin instructions. Take 25 mg by mouth in the morning then 25 mg at noon then 50 mg at bedtime as needed for anxiety or itching)  . insulin aspart (NOVOLOG FLEXPEN) 100 UNIT/ML FlexPen 5-15 units subcut TID with meals (Patient taking differently: Inject 5-15 Units into the skin 3 (three) times daily with meals. )  . Insulin Glargine (LANTUS SOLOSTAR) 100 UNIT/ML Solostar Pen Inject 70 Units into the skin daily. (Patient taking differently: Inject 70 Units into the skin at bedtime. )  . liraglutide (VICTOZA) 18 MG/3ML SOPN Inject 0.1 mLs (0.6 mg total) into the skin daily.  Marland Kitchen lisinopril (PRINIVIL,ZESTRIL) 5 MG tablet Take 0.5 tablets (2.5 mg total) by mouth daily.  . megestrol (MEGACE) 40 MG tablet Take 2 tablets (80 mg total) by mouth daily.  . naproxen sodium (ALEVE) 220 MG tablet Take 220-440 mg by mouth 2 (two) times daily as needed (for pain).  Marland Kitchen omeprazole (PRILOSEC) 20 MG capsule Take 2 capsules (40 mg total) by mouth daily. (Patient taking differently: Take 40 mg by mouth 2 (two) times daily as needed (for reflux). )  . traMADol  (ULTRAM) 50 MG tablet Take 1 tablet (50 mg total) by mouth daily as needed. (Patient taking differently: Take 50 mg by mouth daily as needed (for pain). )  . warfarin (COUMADIN) 5 MG tablet Take 1 tablet (5 mg total) by mouth See admin instructions. 5 mg Sun, Mon , wed,  Fri 10 mg true, Thurs sat (Patient taking differently: Take 5-10 mg by mouth See admin instructions. Take 5 mg by mouth at 4 PM daily on Sun/Mon/Wed/Fri and 10 mg on Tues/Thurs/Sat)    Scheduled Meds: . [START ON 06/27/2018] buPROPion  150 mg Oral BID  . [START ON 06/27/2018] ceFEPime (MAXIPIME) IV  500 mg Intravenous Q24H  . [START ON 06/27/2018] gabapentin  200 mg Oral BID  . insulin aspart  0-9 Units Subcutaneous Q4H  . insulin glargine  30 Units Subcutaneous QHS  . [START ON 06/27/2018] megestrol  80 mg Oral Daily  . [START ON 06/27/2018] pantoprazole  40 mg Oral Daily  . sodium chloride flush  3 mL Intravenous Q12H   Continuous Infusions: . sodium chloride    . [START ON 06/28/2018] vancomycin     PRN Meds:.acetaminophen **OR** acetaminophen, albuterol, fentaNYL (SUBLIMAZE) injection, hydrOXYzine, ondansetron **OR** ondansetron (ZOFRAN) IV  Allergies:  Allergies  Allergen Reactions  . Ciprofloxacin Hcl Hives  . Macrobid [Nitrofurantoin Macrocrystal] Hives, Shortness Of Breath and Rash  . Other Hives, Shortness Of Breath and Rash    NO "-CILLINS"!!!  . Penicillins Shortness Of Breath    Had had cephalosporins without incident Has patient had a PCN reaction causing immediate rash, facial/tongue/throat swelling, SOB or lightheadedness with hypotension: Yes Has patient had a PCN reaction causing severe rash involving mucus membranes or skin necrosis: Unk Has patient had a PCN reaction that required hospitalization: Unk Has patient had a PCN reaction occurring within the last 10 years: No If all of the above answers are "NO", then may proceed with Cephalosporin use.   . Sulfa Antibiotics Hives, Shortness Of Breath and  Rash  . Dilaudid [Hydromorphone Hcl] Other (See Comments)    Asystole per pt report  . Lexapro [Escitalopram Oxalate] Other (See Comments)    "I just did not like it."    Family History  Problem Relation Age of Onset  . Depression Mother   . Diabetes Father   . Heart failure Father     Social History:  reports that she has been smoking cigarettes. She has a 3.75 pack-year smoking history. She has never used smokeless tobacco. She reports that she has current or past drug history. Drug: Marijuana. She reports that she does not drink alcohol.  ROS: A complete review of systems was performed.  All systems are negative except for pertinent findings as noted.  Physical Exam:  Vital signs in last 24 hours: Temp:  [98 F (36.7 C)-98.4 F (36.9 C)] 98.4 F (36.9 C) (09/23 1803) Pulse Rate:  [57-149] 102 (09/23 2030) Resp:  [15-39] 17 (09/23 2030) BP: (33-132)/(13-78) 132/76 (09/23 2030) SpO2:  [96 %-100 %] 100 % (09/23 2030) Constitutional:  Alert and oriented, No acute distress Cardiovascular: Regular rate and rhythm, No JVD Respiratory: Labored respiratory effort, Lungs clear bilaterally GI: Obese, Abdomen is soft, nontender, nondistended, no abdominal masses GU: Right CVA tenderness Lymphatic: No cervical lymphadenopathy Neurologic: Grossly intact, no focal deficits Psychiatric: Normal mood and affect  Laboratory Data:  Recent Labs    06/26/18 1305 06/26/18 1629  WBC 21.7*  --   HGB 11.9* 10.9*  HCT 38.5 32.0*  PLT 351  --     Recent Labs    06/26/18 1305 06/26/18 1629  NA 129* 132*  K 5.3* 4.8  CL 95* 101  GLUCOSE 366* 343*  BUN 48* 50*  CALCIUM 10.0  --   CREATININE 5.87* 6.30*     Results for orders placed or performed during the hospital encounter of 06/26/18 (from the past 24 hour(s))  Lipase, blood     Status: None   Collection Time: 06/26/18  1:05 PM  Result Value Ref Range   Lipase 15 11 - 51 U/L  Comprehensive metabolic panel     Status:  Abnormal   Collection Time: 06/26/18  1:05 PM  Result Value Ref Range   Sodium 129 (L) 135 - 145 mmol/L   Potassium 5.3 (H) 3.5 - 5.1 mmol/L   Chloride 95 (L) 98 - 111 mmol/L   CO2 15 (L) 22 - 32 mmol/L   Glucose, Bld 366 (H) 70 - 99 mg/dL   BUN 48 (H) 6 -  20 mg/dL   Creatinine, Ser 5.87 (H) 0.44 - 1.00 mg/dL   Calcium 10.0 8.9 - 10.3 mg/dL   Total Protein 7.2 6.5 - 8.1 g/dL   Albumin 2.8 (L) 3.5 - 5.0 g/dL   AST 19 15 - 41 U/L   ALT 14 0 - 44 U/L   Alkaline Phosphatase 97 38 - 126 U/L   Total Bilirubin 0.6 0.3 - 1.2 mg/dL   GFR calc non Af Amer 8 (L) >60 mL/min   GFR calc Af Amer 9 (L) >60 mL/min   Anion gap 19 (H) 5 - 15  CBC     Status: Abnormal   Collection Time: 06/26/18  1:05 PM  Result Value Ref Range   WBC 21.7 (H) 4.0 - 10.5 K/uL   RBC 4.19 3.87 - 5.11 MIL/uL   Hemoglobin 11.9 (L) 12.0 - 15.0 g/dL   HCT 38.5 36.0 - 46.0 %   MCV 91.9 78.0 - 100.0 fL   MCH 28.4 26.0 - 34.0 pg   MCHC 30.9 30.0 - 36.0 g/dL   RDW 16.7 (H) 11.5 - 15.5 %   Platelets 351 150 - 400 K/uL  Urinalysis, Routine w reflex microscopic     Status: Abnormal   Collection Time: 06/26/18  1:17 PM  Result Value Ref Range   Color, Urine RED (A) YELLOW   APPearance TURBID (A) CLEAR   Specific Gravity, Urine  1.005 - 1.030    TEST NOT REPORTED DUE TO COLOR INTERFERENCE OF URINE PIGMENT   pH  5.0 - 8.0    TEST NOT REPORTED DUE TO COLOR INTERFERENCE OF URINE PIGMENT   Glucose, UA (A) NEGATIVE mg/dL    TEST NOT REPORTED DUE TO COLOR INTERFERENCE OF URINE PIGMENT   Hgb urine dipstick (A) NEGATIVE    TEST NOT REPORTED DUE TO COLOR INTERFERENCE OF URINE PIGMENT   Bilirubin Urine (A) NEGATIVE    TEST NOT REPORTED DUE TO COLOR INTERFERENCE OF URINE PIGMENT   Ketones, ur (A) NEGATIVE mg/dL    TEST NOT REPORTED DUE TO COLOR INTERFERENCE OF URINE PIGMENT   Protein, ur (A) NEGATIVE mg/dL    TEST NOT REPORTED DUE TO COLOR INTERFERENCE OF URINE PIGMENT   Nitrite (A) NEGATIVE    TEST NOT REPORTED DUE TO COLOR  INTERFERENCE OF URINE PIGMENT   Leukocytes, UA (A) NEGATIVE    TEST NOT REPORTED DUE TO COLOR INTERFERENCE OF URINE PIGMENT  Urinalysis, Microscopic (reflex)     Status: Abnormal   Collection Time: 06/26/18  1:17 PM  Result Value Ref Range   RBC / HPF >50 0 - 5 RBC/hpf   WBC, UA >50 0 - 5 WBC/hpf   Bacteria, UA MANY (A) NONE SEEN   Squamous Epithelial / LPF 6-10 0 - 5  I-Stat beta hCG blood, ED     Status: None   Collection Time: 06/26/18  1:26 PM  Result Value Ref Range   I-stat hCG, quantitative <5.0 <5 mIU/mL   Comment 3          Protime-INR     Status: Abnormal   Collection Time: 06/26/18  2:48 PM  Result Value Ref Range   Prothrombin Time 44.7 (H) 11.4 - 15.2 seconds   INR 4.82 (HH)   I-Stat CG4 Lactic Acid, ED     Status: Abnormal   Collection Time: 06/26/18  3:03 PM  Result Value Ref Range   Lactic Acid, Venous 6.37 (HH) 0.5 - 1.9 mmol/L   Comment NOTIFIED PHYSICIAN  I-Stat venous blood gas, ED     Status: Abnormal   Collection Time: 06/26/18  3:35 PM  Result Value Ref Range   pH, Ven 7.211 (L) 7.250 - 7.430   pCO2, Ven 35.4 (L) 44.0 - 60.0 mmHg   pO2, Ven 22.0 (LL) 32.0 - 45.0 mmHg   Bicarbonate 14.2 (L) 20.0 - 28.0 mmol/L   TCO2 15 (L) 22 - 32 mmol/L   O2 Saturation 28.0 %   Acid-base deficit 13.0 (H) 0.0 - 2.0 mmol/L   Patient temperature HIDE    Sample type VENOUS    Comment NOTIFIED PHYSICIAN   Prepare fresh frozen plasma     Status: None (Preliminary result)   Collection Time: 06/26/18  4:18 PM  Result Value Ref Range   Unit Number [id number]    Blood Component Type THW PLS APHR    Unit division 00    Status of Unit ISSUED    Transfusion Status      OK TO TRANSFUSE Performed at [facility], [address], [place], [place] [address]    Unit Number [id number]    Blood Component Type THW PLS APHR    Unit division 00    Status of Unit ISSUED    Transfusion Status OK TO TRANSFUSE   I-stat troponin, ED     Status: None   Collection Time:  06/26/18  4:27 PM  Result Value Ref Range   Troponin i, poc 0.00 0.00 - 0.08 ng/mL   Comment 3          I-stat Chem 8, ED     Status: Abnormal   Collection Time: 06/26/18  4:29 PM  Result Value Ref Range   Sodium 132 (L) 135 - 145 mmol/L   Potassium 4.8 3.5 - 5.1 mmol/L   Chloride 101 98 - 111 mmol/L   BUN 50 (H) 6 - 20 mg/dL   Creatinine, Ser 6.30 (H) 0.44 - 1.00 mg/dL   Glucose, Bld 343 (H) 70 - 99 mg/dL   Calcium, Ion 1.25 1.15 - 1.40 mmol/L   TCO2 19 (L) 22 - 32 mmol/L   Hemoglobin 10.9 (L) 12.0 - 15.0 g/dL   HCT 32.0 (L) 36.0 - 46.0 %  I-Stat CG4 Lactic Acid, ED     Status: Abnormal   Collection Time: 06/26/18  4:30 PM  Result Value Ref Range   Lactic Acid, Venous 4.36 (HH) 0.5 - 1.9 mmol/L  I-Stat CG4 Lactic Acid, ED     Status: Abnormal   Collection Time: 06/26/18  8:35 PM  Result Value Ref Range   Lactic Acid, Venous 2.18 (HH) 0.5 - 1.9 mmol/L   Comment NOTIFIED PHYSICIAN   CBG monitoring, ED     Status: Abnormal   Collection Time: 06/26/18  9:15 PM  Result Value Ref Range   Glucose-Capillary 214 (H) 70 - 99 mg/dL   No results found for this or any previous visit (from the past 240 hour(s)).  Renal Function: Recent Labs    06/26/18 1305 06/26/18 1629  CREATININE 5.87* 6.30*   Estimated Creatinine Clearance: 15.8 mL/min (A) (by C-G formula based on SCr of 6.3 mg/dL (H)).  Radiologic Imaging: Dg Chest 2 View  Result Date: 06/26/2018 CLINICAL DATA:  shortness of breath, n/v and rt flank pain for two days, weak,hx hematuria EXAM: CHEST - 2 VIEW COMPARISON:  None. FINDINGS: The heart size and mediastinal contours are within normal limits. Both lungs are clear. The visualized skeletal structures are unremarkable. IMPRESSION: No active  cardiopulmonary disease. Electronically Signed   By: Nolon Nations M.D.   On: 06/26/2018 13:38   Ct Angio Chest/abd/pel For Dissection W And/or Wo Contrast  Result Date: 06/26/2018 CLINICAL DATA:  42 year old with shortness of  breath. Evaluate for aortic dissection. EXAM: CT ANGIOGRAPHY CHEST, ABDOMEN AND PELVIS TECHNIQUE: Multidetector CT imaging through the chest, abdomen and pelvis was performed using the standard protocol during bolus administration of intravenous contrast. Multiplanar reconstructed images and MIPs were obtained and reviewed to evaluate the vascular anatomy. CONTRAST:  137mL ISOVUE-370 IOPAMIDOL (ISOVUE-370) INJECTION 76% COMPARISON:  05/12/2018 and chest CT 08/26/2014 FINDINGS: CTA CHEST FINDINGS Cardiovascular: Normal caliber of the thoracic aorta without dissection or intramural hematoma. Heart size is normal without significant pericardial fluid. Pulmonary arteries are not opacified with contrast. Typical aortic arch anatomy. Visualized great vessels are patent. Mediastinum/Nodes: Mediastinal structures are unremarkable. No evidence for mediastinal, hilar or axillary lymphadenopathy. Lungs/Pleura: Trachea and mainstem bronchi are patent. No large pleural effusions. Atelectasis in the right lower lobe. There are 2 stable nodular densities along the right major fissure since 2015. The largest measures 7 mm on sequence 11, image 62. These are compatible with benign findings based on the stability. Stable pleural-based nodularity along the anterior right lung on sequence 11, image 75. Nodular density along the left side of the heart on sequence 11, image 90 is probably unchanged since 2015. No large areas of airspace disease or lung consolidation. Musculoskeletal: No acute bone abnormality. Review of the MIP images confirms the above findings. CTA ABDOMEN AND PELVIS FINDINGS VASCULAR Aorta: Abdominal aorta is patent without dissection. Limited evaluation of the aorta due to poor contrast opacification and body habitus. Celiac: Celiac trunk and main branch vessels are patent. SMA: SMA is patent but limited evaluation. Renals: Flow in bilateral renal arteries but limited evaluation. IMA: Limited evaluation. Inflow:  Again noted is a stent left common iliac artery. Left common iliac artery stent appears to be patent. There appears to be flow in the left external iliac artery. Limited evaluation of the right common iliac artery. There is flow in the right external iliac artery. Surgical clips of the left groin. Flow in the proximal bilateral femoral arteries but limited evaluation. Veins: No obvious venous abnormality within the limitations of this arterial phase study. Review of the MIP images confirms the above findings. NON-VASCULAR Hepatobiliary: Distention of the gallbladder. No gross abnormality to the liver but limited evaluation due to streak artifact. Pancreas: Unremarkable. No pancreatic ductal dilatation or surrounding inflammatory changes. Spleen: Normal in size without focal abnormality. Adrenals/Urinary Tract: Normal appearance of the right adrenal gland. There is stable fullness and nodularity in the left adrenal gland measuring up to 1.7 cm and compatible with an adenoma based on exam from 08/17/2015. There is new right perinephric edema. There is a new gas in the bilateral renal collecting systems. Decreased enhancement scattered throughout both kidneys, particularly the right kidney. Findings are suggestive for pyelonephritis. Focal pyelonephritis in the left kidney upper pole. There are several small bilateral renal calculi. There is extensive cortical thinning and scarring in the left kidney. There is now a small stone in the proximal right ureter measuring roughly 6 mm. There is new mild right hydronephrosis. One or two small stones in the right renal pelvis. Minimal dilatation of left renal pelvis. Stomach/Bowel: No acute inflammation involving the bowel structures. Stomach is unremarkable. Lymphatic: No significant lymph node enlargement. Reproductive: Stable appearance of the uterus and adnexal structures. Right adnexa is mildly prominent but poorly characterized  on this examination. Other: Trace fluid in  the pelvis.  Negative for free air. Musculoskeletal: No acute abnormality. Review of the MIP images confirms the above findings. IMPRESSION: 1. Bilateral pyelonephritis with emphysematous pyelitis. Gas within the bilateral renal collecting systems and urinary bladder. 2. Bilateral renal calculi with 6 mm stone in the proximal right ureter. Mild right hydronephrosis. 3. Negative for an aortic dissection. Limited evaluation of the abdominopelvic vascular structures. The left common iliac artery stent appears to be patent. 4. Extensive scarring in the left kidney. These results were called by telephone at the time of interpretation on 06/26/2018 at 7:26 pm to Dr. Tomi Bamberger , who verbally acknowledged these results. Electronically Signed   By: Markus Daft M.D.   On: 06/26/2018 19:26    I independently reviewed the above imaging studies.  Impression/Recommendation 1.  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  She is currently on broad spectrum abx with cefepime and vancomycin.  Blood and urine cultures are pending.  Lactic acidosis is improving with IVF resuscitation though her blood pressure remains labile (not currently requiring vasopressors).  I spoke with Dr. Vernard Gambles with interventional radiology and he is going to assess the patient for a right percutaneous nephrostomy tube as soon as her coagulopathy is corrected.  I spoke directly with Dr. Myna Hidalgo and he is going to assure that her repeat PT/INR is collected and the appropriate steps are made to bring her INR down to <2 prior to her nephrostomy tube placement.  Foley placement ordered.   2.  Peripheral vascular disease and history of DVT-  She did not take her coumadin today and, per her chart, she received 2 units of FFP after her INR was found to be >4.  Repeat PT/INR pending.  Her INR needs to be <2 before IR can  proceed.   3.  Type 2 DM- management per medicine team.  Blood glucose was >300 this evening  Ellison Hughs, MD [facility] 06/26/2018, 9:36 PM       opyd  [phone]

## 2018-06-26 NOTE — Progress Notes (Signed)
Pharmacy Antibiotic Note  Terri Sparks is a 42 y.o. female admitted on 06/26/2018 with sepsis.  Pharmacy has been consulted for cefepime and vacomycin dosing. Pt with AKI , scr 6.3, CrCl ~15. WBC 21.7, Lac 4.36, afebrile.  Plan: Vancomycin 2000mg  x1, then 1750mg  Q48H Cefepime 2g x1, then 500mg  Q24H F/u LOT/de-escalation, cultures, renal function, troughs prn.      Temp (24hrs), Avg:98 F (36.7 C), Min:98 F (36.7 C), Max:98 F (36.7 C)  Recent Labs  Lab 06/26/18 1305 06/26/18 1503 06/26/18 1629  WBC 21.7*  --   --   CREATININE 5.87*  --  6.30*  LATICACIDVEN  --  6.37*  --     Estimated Creatinine Clearance: 15.8 mL/min (A) (by C-G formula based on SCr of 6.3 mg/dL (H)).    Allergies  Allergen Reactions  . Ciprofloxacin Hcl Hives  . Macrobid [Nitrofurantoin Macrocrystal] Hives, Shortness Of Breath and Rash  . Other Hives, Shortness Of Breath and Rash    NO "-CILLINS"!!!  . Penicillins Shortness Of Breath    Had had cephalosporins without incident  . Sulfa Antibiotics Hives, Shortness Of Breath and Rash  . Lexapro [Escitalopram Oxalate]     I just did not like it.    Antimicrobials this admission: vanc 9/23 >>   cefepime 9/23 >>   Dose adjustments this admission:   Microbiology results:   Thank you for allowing pharmacy to be a part of this patient's care.  Harrietta Guardian, PharmD PGY1 Pharmacy Resident 06/26/2018    4:58 PM

## 2018-06-26 NOTE — ED Notes (Signed)
Back from ct scan , ct scan with contrast unsuccesfull due to positional IV access

## 2018-06-26 NOTE — ED Notes (Signed)
1L LR bolus given IV per CCM.

## 2018-06-26 NOTE — ED Notes (Signed)
Going to CT SCAN with patient

## 2018-06-27 ENCOUNTER — Inpatient Hospital Stay (HOSPITAL_COMMUNITY): Payer: Medicaid Other | Admitting: Anesthesiology

## 2018-06-27 ENCOUNTER — Encounter (HOSPITAL_COMMUNITY)
Admission: EM | Disposition: A | Discharge status: Home / Self Care | Payer: Self-pay | Source: Home / Self Care | Attending: Internal Medicine

## 2018-06-27 ENCOUNTER — Other Ambulatory Visit: Payer: Self-pay

## 2018-06-27 ENCOUNTER — Inpatient Hospital Stay (HOSPITAL_COMMUNITY): Payer: Medicaid Other

## 2018-06-27 HISTORY — PX: CYSTOSCOPY W/ URETERAL STENT PLACEMENT: SHX1429

## 2018-06-27 LAB — COMPREHENSIVE METABOLIC PANEL
ALT: 12 U/L (ref 0–44)
AST: 15 U/L (ref 15–41)
Albumin: 2.2 g/dL — ABNORMAL LOW (ref 3.5–5.0)
Alkaline Phosphatase: 74 U/L (ref 38–126)
Anion gap: 14 (ref 5–15)
BUN: 52 mg/dL — ABNORMAL HIGH (ref 6–20)
CO2: 16 mmol/L — ABNORMAL LOW (ref 22–32)
Calcium: 8.3 mg/dL — ABNORMAL LOW (ref 8.9–10.3)
Chloride: 105 mmol/L (ref 98–111)
Creatinine, Ser: 5.73 mg/dL — ABNORMAL HIGH (ref 0.44–1.00)
GFR calc Af Amer: 10 mL/min — ABNORMAL LOW (ref >60)
GFR calc non Af Amer: 8 mL/min — ABNORMAL LOW (ref >60)
Glucose, Bld: 197 mg/dL — ABNORMAL HIGH (ref 70–99)
Potassium: 4.4 mmol/L (ref 3.5–5.1)
Sodium: 135 mmol/L (ref 135–145)
Total Bilirubin: 0.6 mg/dL (ref 0.3–1.2)
Total Protein: 5.7 g/dL — ABNORMAL LOW (ref 6.5–8.1)

## 2018-06-27 LAB — CBC WITH DIFFERENTIAL/PLATELET
Basophils Absolute: 0.2 10*3/uL — ABNORMAL HIGH (ref 0.0–0.1)
Basophils Relative: 1 %
Eosinophils Absolute: 0.2 10*3/uL (ref 0.0–0.7)
Eosinophils Relative: 1 %
HCT: 28.7 % — ABNORMAL LOW (ref 36.0–46.0)
Hemoglobin: 9.1 g/dL — ABNORMAL LOW (ref 12.0–15.0)
Lymphocytes Relative: 6 %
Lymphs Abs: 1 10*3/uL (ref 0.7–4.0)
MCH: 28.7 pg (ref 26.0–34.0)
MCHC: 31.7 g/dL (ref 30.0–36.0)
MCV: 90.5 fL (ref 78.0–100.0)
Monocytes Absolute: 0.6 10*3/uL (ref 0.1–1.0)
Monocytes Relative: 4 %
Neutro Abs: 13.9 10*3/uL — ABNORMAL HIGH (ref 1.7–7.7)
Neutrophils Relative %: 88 %
Platelets: 269 10*3/uL (ref 150–400)
RBC: 3.17 MIL/uL — ABNORMAL LOW (ref 3.87–5.11)
RDW: 16.7 % — ABNORMAL HIGH (ref 11.5–15.5)
WBC: 15.9 10*3/uL — ABNORMAL HIGH (ref 4.0–10.5)

## 2018-06-27 LAB — BPAM FFP
Blood Product Expiration Date: 201909272359
Blood Product Expiration Date: 201909272359
ISSUE DATE / TIME: 201909231627
ISSUE DATE / TIME: 201909231627
Unit Type and Rh: 600
Unit Type and Rh: 6200

## 2018-06-27 LAB — TYPE AND SCREEN
ABO/RH(D): O NEG
Antibody Screen: NEGATIVE

## 2018-06-27 LAB — PROTIME-INR
INR: 4.22
INR: 2.84
INR: 2.18
Prothrombin Time: 40.3 seconds — ABNORMAL HIGH (ref 11.4–15.2)
Prothrombin Time: 29.6 seconds — ABNORMAL HIGH (ref 11.4–15.2)
Prothrombin Time: 24.1 seconds — ABNORMAL HIGH (ref 11.4–15.2)

## 2018-06-27 LAB — GLUCOSE, CAPILLARY
Glucose-Capillary: 113 mg/dL — ABNORMAL HIGH (ref 70–99)
Glucose-Capillary: 126 mg/dL — ABNORMAL HIGH (ref 70–99)
Glucose-Capillary: 164 mg/dL — ABNORMAL HIGH (ref 70–99)
Glucose-Capillary: 182 mg/dL — ABNORMAL HIGH (ref 70–99)
Glucose-Capillary: 188 mg/dL — ABNORMAL HIGH (ref 70–99)
Glucose-Capillary: 210 mg/dL — ABNORMAL HIGH (ref 70–99)

## 2018-06-27 LAB — LIPID PANEL
Cholesterol: 84 mg/dL (ref 0–200)
HDL: 13 mg/dL — ABNORMAL LOW (ref >40)
LDL Cholesterol: 24 mg/dL (ref 0–99)
Total CHOL/HDL Ratio: 6.5 RATIO
Triglycerides: 233 mg/dL — ABNORMAL HIGH (ref <150)
VLDL: 47 mg/dL — ABNORMAL HIGH (ref 0–40)

## 2018-06-27 LAB — BLOOD CULTURE ID PANEL (REFLEXED)

## 2018-06-27 LAB — LACTIC ACID, PLASMA: Lactic Acid, Venous: 2.5 mmol/L (ref 0.5–1.9)

## 2018-06-27 LAB — PREPARE FRESH FROZEN PLASMA
Unit division: 0
Unit division: 0

## 2018-06-27 LAB — OSMOLALITY: Osmolality: 299 mOsm/kg — ABNORMAL HIGH (ref 275–295)

## 2018-06-27 LAB — SURGICAL PCR SCREEN
MRSA, PCR: NEGATIVE
Staphylococcus aureus: NEGATIVE

## 2018-06-27 LAB — HIV ANTIBODY (ROUTINE TESTING W REFLEX): HIV Screen 4th Generation wRfx: NONREACTIVE

## 2018-06-27 LAB — MRSA PCR SCREENING: MRSA by PCR: NEGATIVE

## 2018-06-27 SURGERY — CYSTOSCOPY, WITH RETROGRADE PYELOGRAM AND URETERAL STENT INSERTION
Anesthesia: General | Laterality: Right

## 2018-06-27 MED ORDER — SODIUM CHLORIDE 0.9 % IV BOLUS
500.0000 mL | Freq: Once | INTRAVENOUS | Status: AC
  Administered 2018-06-27: 500 mL via INTRAVENOUS

## 2018-06-27 MED ORDER — SODIUM CHLORIDE 0.9 % IV BOLUS: 1000.0000 mL | Freq: Four times a day (QID) | INTRAVENOUS | Status: DC | PRN

## 2018-06-27 MED ORDER — MORPHINE SULFATE (PF) 4 MG/ML IV SOLN
4.0000 mg | Freq: Once | INTRAVENOUS | Status: AC
  Administered 2018-06-28: 4 mg via INTRAVENOUS
  Filled 2018-06-27: qty 1

## 2018-06-27 MED ORDER — SODIUM CHLORIDE 0.9 % IV SOLN
2.0000 g | INTRAVENOUS | Status: AC
  Administered 2018-06-27 – 2018-07-01 (×5): 2 g via INTRAVENOUS
  Filled 2018-06-27 (×5): qty 20

## 2018-06-27 MED ORDER — FENTANYL CITRATE (PF) 100 MCG/2ML IJ SOLN
INTRAMUSCULAR | Status: AC
  Filled 2018-06-27: qty 2

## 2018-06-27 MED ORDER — FENTANYL CITRATE (PF) 250 MCG/5ML IJ SOLN
INTRAMUSCULAR | Status: AC
  Filled 2018-06-27: qty 5

## 2018-06-27 MED ORDER — LACTATED RINGERS IV SOLN
INTRAVENOUS | Status: DC
  Administered 2018-06-27: 1000 mL via INTRAVENOUS

## 2018-06-27 MED ORDER — SODIUM CHLORIDE 0.9% IV SOLUTION: Freq: Once | INTRAVENOUS | Status: DC

## 2018-06-27 MED ORDER — SUGAMMADEX SODIUM 500 MG/5ML IV SOLN
INTRAVENOUS | Status: AC
  Filled 2018-06-27: qty 5

## 2018-06-27 MED ORDER — SODIUM CHLORIDE 0.9 % IV SOLN
1.0000 g | INTRAVENOUS | Status: DC
  Filled 2018-06-27: qty 1

## 2018-06-27 MED ORDER — FENTANYL CITRATE (PF) 100 MCG/2ML IJ SOLN
25.0000 ug | INTRAMUSCULAR | Status: DC | PRN
  Administered 2018-06-27: 50 ug via INTRAVENOUS

## 2018-06-27 MED ORDER — LIDOCAINE 2% (20 MG/ML) 5 ML SYRINGE
INTRAMUSCULAR | Status: AC
  Filled 2018-06-27: qty 5

## 2018-06-27 MED ORDER — MUPIROCIN 2 % EX OINT
1.0000 "application " | TOPICAL_OINTMENT | Freq: Two times a day (BID) | CUTANEOUS | Status: DC
  Administered 2018-06-28: 1 via NASAL
  Filled 2018-06-27 (×2): qty 22

## 2018-06-27 MED ORDER — LACTATED RINGERS IV BOLUS
500.0000 mL | Freq: Once | INTRAVENOUS | Status: AC
  Administered 2018-06-27: 500 mL via INTRAVENOUS

## 2018-06-27 MED ORDER — PHENYLEPHRINE HCL 10 MG/ML IJ SOLN
INTRAMUSCULAR | Status: DC | PRN
  Administered 2018-06-27: 80 ug via INTRAVENOUS

## 2018-06-27 MED ORDER — SUCCINYLCHOLINE CHLORIDE 20 MG/ML IJ SOLN
INTRAMUSCULAR | Status: DC | PRN
  Administered 2018-06-27: 140 mg via INTRAVENOUS

## 2018-06-27 MED ORDER — 0.9 % SODIUM CHLORIDE (POUR BTL) OPTIME
TOPICAL | Status: DC | PRN
  Administered 2018-06-27: 1000 mL

## 2018-06-27 MED ORDER — SODIUM CHLORIDE 0.9% IV SOLUTION
Freq: Once | INTRAVENOUS | Status: AC
  Administered 2018-06-27: 04:00:00 via INTRAVENOUS

## 2018-06-27 MED ORDER — NYSTATIN 100000 UNIT/GM EX POWD
Freq: Four times a day (QID) | CUTANEOUS | Status: DC
  Administered 2018-06-27: 1 g via TOPICAL
  Administered 2018-06-27 – 2018-07-05 (×27): via TOPICAL
  Filled 2018-06-27: qty 15

## 2018-06-27 MED ORDER — FENTANYL CITRATE (PF) 100 MCG/2ML IJ SOLN
INTRAMUSCULAR | Status: DC | PRN
  Administered 2018-06-27: 25 ug via INTRAVENOUS
  Administered 2018-06-27: 100 ug via INTRAVENOUS

## 2018-06-27 MED ORDER — ONDANSETRON HCL 4 MG/2ML IJ SOLN
INTRAMUSCULAR | Status: AC
  Filled 2018-06-27: qty 2

## 2018-06-27 MED ORDER — PROMETHAZINE HCL 25 MG/ML IJ SOLN: 6.2500 mg | INTRAMUSCULAR | Status: DC | PRN

## 2018-06-27 MED ORDER — MIDAZOLAM HCL 5 MG/5ML IJ SOLN
INTRAMUSCULAR | Status: DC | PRN
  Administered 2018-06-27: 2 mg via INTRAVENOUS

## 2018-06-27 MED ORDER — LIDOCAINE HCL (CARDIAC) PF 100 MG/5ML IV SOSY
PREFILLED_SYRINGE | INTRAVENOUS | Status: DC | PRN
  Administered 2018-06-27: 100 mg via INTRAVENOUS

## 2018-06-27 MED ORDER — VITAMIN K1 10 MG/ML IJ SOLN
1.0000 mg | Freq: Once | INTRAVENOUS | Status: AC
  Administered 2018-06-27: 1 mg via INTRAVENOUS
  Filled 2018-06-27: qty 0.1

## 2018-06-27 MED ORDER — LACTATED RINGERS IV SOLN
INTRAVENOUS | Status: DC
  Administered 2018-06-27 (×2): via INTRAVENOUS

## 2018-06-27 MED ORDER — MIDAZOLAM HCL 2 MG/2ML IJ SOLN
INTRAMUSCULAR | Status: AC
  Filled 2018-06-27: qty 2

## 2018-06-27 MED ORDER — PROPOFOL 10 MG/ML IV BOLUS
INTRAVENOUS | Status: DC | PRN
  Administered 2018-06-27: 130 mg via INTRAVENOUS

## 2018-06-27 MED ORDER — FENTANYL CITRATE (PF) 100 MCG/2ML IJ SOLN
50.0000 ug | INTRAMUSCULAR | Status: DC | PRN
  Administered 2018-06-28: 50 ug via INTRAVENOUS
  Filled 2018-06-27: qty 2

## 2018-06-27 MED ORDER — PROPOFOL 10 MG/ML IV BOLUS
INTRAVENOUS | Status: AC
  Filled 2018-06-27: qty 20

## 2018-06-27 MED ORDER — DIPHENHYDRAMINE HCL 50 MG/ML IJ SOLN: 25.0000 mg | Freq: Four times a day (QID) | INTRAMUSCULAR | Status: DC | PRN

## 2018-06-27 SURGICAL SUPPLY — 27 items
BAG DRAIN URO-CYSTO SKYTR STRL (DRAIN) ×2 IMPLANT
BAG URO CATCHER STRL LF (MISCELLANEOUS) ×2 IMPLANT
BASKET STONE 1.7 NGAGE (UROLOGICAL SUPPLIES) IMPLANT
BASKET ZERO TIP NITINOL 2.4FR (BASKET) ×2 IMPLANT
BENZOIN TINCTURE PRP APPL 2/3 (GAUZE/BANDAGES/DRESSINGS) IMPLANT
CATH FOLEY 2WAY SLVR  5CC 18FR (CATHETERS) ×1
CATH FOLEY 2WAY SLVR 5CC 18FR (CATHETERS) ×1 IMPLANT
CATH URET 5FR 28IN OPEN ENDED (CATHETERS) IMPLANT
CLOTH BEACON ORANGE TIMEOUT ST (SAFETY) ×2 IMPLANT
FIBER LASER FLEXIVA 365 (UROLOGICAL SUPPLIES) IMPLANT
FIBER LASER TRAC TIP (UROLOGICAL SUPPLIES) IMPLANT
GLOVE BIO SURGEON STRL SZ7.5 (GLOVE) ×2 IMPLANT
GOWN STRL REUS W/TWL XL LVL3 (GOWN DISPOSABLE) IMPLANT
GUIDEWIRE ANG ZIPWIRE 038X150 (WIRE) ×2 IMPLANT
GUIDEWIRE STR DUAL SENSOR (WIRE) ×2 IMPLANT
INFUSOR MANOMETER BAG 3000ML (MISCELLANEOUS) ×2 IMPLANT
IV NS 1000ML (IV SOLUTION)
IV NS 1000ML BAXH (IV SOLUTION) IMPLANT
IV NS IRRIG 3000ML ARTHROMATIC (IV SOLUTION) ×2 IMPLANT
KIT TURNOVER CYSTO (KITS) ×2 IMPLANT
MANIFOLD NEPTUNE II (INSTRUMENTS) ×2 IMPLANT
NS IRRIG 500ML POUR BTL (IV SOLUTION) ×4 IMPLANT
PACK CYSTO (CUSTOM PROCEDURE TRAY) ×2 IMPLANT
STENT URET 6FRX24 CONTOUR (STENTS) ×2 IMPLANT
STRIP CLOSURE SKIN 1/2X4 (GAUZE/BANDAGES/DRESSINGS) IMPLANT
SYR 10ML LL (SYRINGE) ×2 IMPLANT
TUBE CONNECTING 12X1/4 (SUCTIONS) IMPLANT

## 2018-06-27 NOTE — Transfer of Care (Signed)
Immediate Anesthesia Transfer of Care Note  Patient: Terri Sparks  Procedure(s) Performed: CYSTOSCOPY WITH RETROGRADE PYELOGRAM/URETERAL DOUBLE J STENT PLACEMENT (Right )  Patient Location: PACU  Anesthesia Type:General  Level of Consciousness: awake, alert , oriented and patient cooperative  Airway & Oxygen Therapy: Patient Spontanous Breathing and Patient connected to nasal cannula oxygen  Post-op Assessment: Report given to RN, Post -op Vital signs reviewed and stable and Patient moving all extremities  Post vital signs: Reviewed and stable  Last Vitals:  Vitals Value Taken Time  BP 89/50 06/27/2018  7:07 PM  Temp    Pulse 109 06/27/2018  7:09 PM  Resp 28 06/27/2018  7:09 PM  SpO2 95 % 06/27/2018  7:09 PM  Vitals shown include unvalidated device data.  Last Pain:  Vitals:   06/27/18 1503  TempSrc:   PainSc: 6       Patients Stated Pain Goal: 4 (70/[id number]/95 [id number])  Complications: No apparent anesthesia complications

## 2018-06-27 NOTE — Anesthesia Postprocedure Evaluation (Signed)
Anesthesia Post Note  Patient: Terri Sparks  Procedure(s) Performed: CYSTOSCOPY WITH RETROGRADE PYELOGRAM/URETERAL DOUBLE J STENT PLACEMENT (Right )     Patient location during evaluation: PACU Anesthesia Type: General Level of consciousness: awake and alert Pain management: pain level controlled Vital Signs Assessment: post-procedure vital signs reviewed and stable Respiratory status: spontaneous breathing, nonlabored ventilation, respiratory function stable and patient connected to nasal cannula oxygen Cardiovascular status: blood pressure returned to baseline and stable Postop Assessment: no apparent nausea or vomiting Anesthetic complications: no    Last Vitals:  Vitals:   06/27/18 1945 06/27/18 1949  BP:  106/75  Pulse: (!) 109 (!) 112  Resp: 18 18  Temp:  37.6 C  SpO2: 99% 97%    Last Pain:  Vitals:   06/27/18 1949  TempSrc:   PainSc: 3                  Herb Beltre DAVID

## 2018-06-27 NOTE — Progress Notes (Signed)
VAST RN to surgical short stay to start PIV. Pt already sent to OR with IV access.

## 2018-06-27 NOTE — Progress Notes (Signed)
Right perc nephrostomy tube placement with IR is pending.  INR is still elevated.  FFP and vit K given to help reverse (a process that was started by the emergency/medical staff prior to my consultation).  She remains afebrile, but was hypotensive and tachycardic throughout the night.  Will continue to monitor.

## 2018-06-27 NOTE — Progress Notes (Signed)
PHARMACY - PHYSICIAN COMMUNICATION CRITICAL VALUE ALERT - BLOOD CULTURE IDENTIFICATION (BCID)  Terri Sparks is an 42 y.o. female who presented to [facility] on 06/26/2018   Name of physician (or Provider) Contacted: Dr Candiss Norse  Current antibiotics: Cefepime Vanc  Changes to prescribed antibiotics recommended:  DC Cefepime Vanc Ceftriaxone 2 g q24h  Results for orders placed or performed during the hospital encounter of 06/26/18  Blood Culture ID Panel (Reflexed) (Collected: 06/26/2018  4:21 PM)  Result Value Ref Range   Enterococcus species NOT DETECTED NOT DETECTED   Listeria monocytogenes NOT DETECTED NOT DETECTED   Staphylococcus species NOT DETECTED NOT DETECTED   Staphylococcus aureus NOT DETECTED NOT DETECTED   Streptococcus species NOT DETECTED NOT DETECTED   Streptococcus agalactiae NOT DETECTED NOT DETECTED   Streptococcus pneumoniae NOT DETECTED NOT DETECTED   Streptococcus pyogenes NOT DETECTED NOT DETECTED   Acinetobacter baumannii NOT DETECTED NOT DETECTED   Enterobacteriaceae species DETECTED (A) NOT DETECTED   Enterobacter cloacae complex NOT DETECTED NOT DETECTED   Escherichia coli DETECTED (A) NOT DETECTED   Klebsiella oxytoca NOT DETECTED NOT DETECTED   Klebsiella pneumoniae NOT DETECTED NOT DETECTED   Proteus species NOT DETECTED NOT DETECTED   Serratia marcescens NOT DETECTED NOT DETECTED   Carbapenem resistance NOT DETECTED NOT DETECTED   Haemophilus influenzae NOT DETECTED NOT DETECTED   Neisseria meningitidis NOT DETECTED NOT DETECTED   Pseudomonas aeruginosa NOT DETECTED NOT DETECTED   Candida albicans NOT DETECTED NOT DETECTED   Candida glabrata NOT DETECTED NOT DETECTED   Candida krusei NOT DETECTED NOT DETECTED   Candida parapsilosis NOT DETECTED NOT DETECTED   Candida tropicalis NOT DETECTED NOT DETECTED   Levester Fresh, PharmD, BCPS, BCCCP Clinical Pharmacist [phone]  Please check AMION for all [place] numbers  06/27/2018 1:20 PM

## 2018-06-27 NOTE — Significant Event (Addendum)
Rapid Response Event Note  Overview: Sepsis  Initial Focused Assessment: Called by 5W RN and ED RN about patient's level of care. I was in the ED and was able to see the patient. Patient was alert, oriented, and follow commands. Patient has received 5L of fluids so far and her SBP are reading in 70s, with MAP in the 50s. Patient does have a history of poor vasculature, uncontrolled DM, and kidney stones. Being treated for BL pyelonephritis (BL renal calculi as well). Already started on IVF/ABX.  Poor skin turgor, delayed capillary refill (could be baseline with history of overall poor vasculature), labs indicated that patient is very dehydrated.   I did updated the [place] MD at 2345, [facility] NP was also updated on patient at [place], and CCM team was also updated at 2345.   Interventions: - 6th NS liter started - Repeat labs ordered, difficult stick, I called Lab Tech upstairs to help obtain labs once patient goes to inpatient bed. - Foley  Plan of Care: - Follow labs, - Strict I/O - Monitor patient's closely, will try a different size BP cuff as well. - 0200 - labs reviewed, overall improved, INR was 4.22, [facility] MD ordered Vit K and FFP (patient is on warfarin). BP improved, lactic bumped to 2.5, 500cc NS bolus ordered. - 0530 - called RN for updated, VS remained stable, FFP infusing, post transfusion labs ordered.  Event Summary: Call Time: 2321 from 5W RN and ED RN as well at 2325 End Time: [place], [place]

## 2018-06-27 NOTE — Progress Notes (Signed)
Inpatient Diabetes Program Recommendations  AACE/ADA: New Consensus Statement on Inpatient Glycemic Control (2015)  Target Ranges:  Prepandial:   less than 140 mg/dL      Peak postprandial:   less than 180 mg/dL (1-2 hours)      Critically ill patients:  140 - 180 mg/dL   Lab Results  Component Value Date   GLUCAP 188 (H) 06/27/2018   HGBA1C 8.7 (A) 06/20/2018    Review of Glycemic ControlResults for BRISEIDY, SPARK (MRN [id number]) as of 06/27/2018 12:18  Ref. Range 06/26/2018 21:15 06/27/2018 00:48 06/27/2018 05:07 06/27/2018 08:25  Glucose-Capillary Latest Ref Range: 70 - 99 mg/dL 214 (H) 182 (H) 210 (H) 188 (H)   Diabetes history: Type 2 DM  Outpatient Diabetes medications: Lantus 70 units daily, Novolog 5-15 units tid with meals, Victoza 0.6 mg daily Current orders for Inpatient glycemic control:  Lantus 30 units q HS, Novolog sensitive q 4 hours Inpatient Diabetes Program Recommendations:   Note patient is currently NPO.  Agree with current orders.  Will follow.   Thanks,  Adah Perl, RN, BC-ADM Inpatient Diabetes Coordinator Pager [phone] (8a-5p)

## 2018-06-27 NOTE — Anesthesia Procedure Notes (Signed)
Procedure Name: Intubation Date/Time: 06/27/2018 6:31 PM Performed by: Ryllie Nieland T, CRNA Pre-anesthesia Checklist: Patient identified, Emergency Drugs available, Suction available and Patient being monitored Patient Re-evaluated:Patient Re-evaluated prior to induction Oxygen Delivery Method: Circle system utilized Preoxygenation: Pre-oxygenation with 100% oxygen Induction Type: IV induction Ventilation: Mask ventilation without difficulty Laryngoscope Size: Miller and 2 Grade View: Grade I Tube type: Oral Tube size: 7.0 mm Number of attempts: 1 Airway Equipment and Method: Patient positioned with wedge pillow and Stylet Placement Confirmation: ETT inserted through vocal cords under direct vision,  positive ETCO2 and breath sounds checked- equal and bilateral Secured at: 22 cm Tube secured with: Tape Dental Injury: Teeth and Oropharynx as per pre-operative assessment

## 2018-06-27 NOTE — Progress Notes (Signed)
Patient urinated around  the foley cath in the bed.  Removed the foley catheter that was placed in the OR.  Patient voided 200 mL of bright red urine in the bedside commode. Will continue to monitor the patient and notify MD as needed

## 2018-06-27 NOTE — Progress Notes (Signed)
@IPLOG @        PROGRESS NOTE                                                                                                                                                                                                             Patient Demographics:    Terri Sparks, is a 42 y.o. female, DOB - 1975/11/02, FIE:[id number]  Admit date - 06/26/2018   Admitting Physician Vianne Bulls, MD  Outpatient Primary MD for the patient is Ladell Pier, MD  LOS - 1  Chief Complaint  Patient presents with  . Shortness of Breath       Brief Narrative  Terri Sparks is a 42 y.o. 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  Patient reports that she developed pain in the right flank and gross hematuria almost a week ago, was diagnosed with sepsis, right ureteric stone along with pyelonephritis likely bilateral.  Urology and IR were consulted and she was admitted to the hospital.   Subjective:    Terri Sparks today has, No headache, No chest pain, No abdominal pain - No Nausea, No new weakness tingling or numbness, No Cough - SOB.     Assessment  & Plan :    1. Severe sepsis secondary to lateral pyelonephritis with right-sided ureteral stone. Patient had severe sepsis and hypotension, she was treated with IV fluids and empiric IV antibiotics which are vancomycin and cefepime, this morning she has shown some clinical improvement and appears nontoxic, minimal right-sided flank pain, cultures so far negative but will be monitored.  Continue maintenance IV fluids.    Discussed the case both with urology and IR on 06/27/2018.  Plan is for urology to take her to the OR later this evening, they are fine with an INR  of under 3.  Continue supportive care till then.  2. Right ureteral stone - as above.  3. Left iliac artery thrombosis; supratherapeutic INR -   required vitamin K and FFP for potential OR visit at the time of admission, INR has come down from 4.5-2.8.  Urology does not want any further reversal, once she comes out of require heparin bridging will be initiated without bolus.  Pharmacy will be consulted for the same.  4. Insulin-dependent DM 2 - - A1c was 10.3% in  April ,  Managed at home with Lantus 70 units qHS and Novolog 5-15 units TID, Follow CBG's and start with reduced-dose Lantus and sensitive-scale SSI given renal failure and NPO status.  CBG (last 3)  Recent Labs    06/27/18 0048 06/27/18 0507 06/27/18 0825  GLUCAP 182* 210* 188*     5. History of hypertension - She is hypotensive in setting of sepsis and lisinopril held IV fluid boluses as needed.  Continue maintenance IV fluid for now.    6. Acute kidney injury superimposed on CKD III  - SCr is 5.87 on admission, up from 1.55 last month, this is due to #1 above.  Hydrate, remove right ureteral obstruction and monitor.  7. Hyponatremia -  Serum sodium is 129 on admission, hydrate and monitor.  Improving.   8. Hyperkalemia -resolved after hyperkalemia protocol.  9. Metabolic acidosis with elevated AG -  Secondary to sepsis and AKI, supportive care and monitor.     Family Communication  :  None  Code Status :  Full  Disposition Plan  :  Stepdown  Consults  : Urology, IR  Procedures  :    CT -  1. Bilateral pyelonephritis with emphysematous pyelitis. Gas within the bilateral renal collecting systems and urinary bladder. 2. Bilateral renal calculi with 6 mm stone in the proximal right ureter. Mild right hydronephrosis. 3. Negative for an aortic dissection. Limited evaluation of the abdominopelvic vascular structures. The left common iliac artery stent appears to be patent. 4. Extensive scarring in the left  kidney.  DVT Prophylaxis  :   Heparin/Coumadin    Lab Results  Component Value Date   PLT 269 06/27/2018    Diet :  Diet Order            Diet NPO time specified  Diet effective now               Inpatient Medications Scheduled Meds: . sodium chloride   Intravenous Once  . buPROPion  150 mg Oral BID  . gabapentin  200 mg Oral BID  . insulin aspart  0-9 Units Subcutaneous Q4H  . insulin glargine  30 Units Subcutaneous QHS  . megestrol  80 mg Oral Daily  . pantoprazole  40 mg Oral Daily  . sodium chloride flush  3 mL Intravenous Q12H   Continuous Infusions: . ceFEPime (MAXIPIME) IV    . lactated ringers    . sodium chloride    . sodium chloride    . [START ON 06/28/2018] vancomycin     PRN Meds:.acetaminophen **OR** [DISCONTINUED] acetaminophen, albuterol, diphenhydrAMINE, fentaNYL (SUBLIMAZE) injection, [DISCONTINUED] ondansetron **OR** ondansetron (ZOFRAN) IV, sodium chloride  Antibiotics  :   Anti-infectives (From admission, onward)   Start     Dose/Rate Route Frequency Ordered Stop   06/28/18 1900  vancomycin (VANCOCIN) 1,750 mg in sodium chloride 0.9 % 500 mL IVPB     1,750 mg 250 mL/hr over 120 Minutes Intravenous Every 48 hours 06/26/18 2022     06/27/18 1800  ceFEPIme (MAXIPIME) 500 mg in dextrose 5 % 50 mL IVPB  Status:  Discontinued     500 mg 100 mL/hr over 30 Minutes Intravenous Every 24 hours 06/26/18 2022 06/27/18 0852   06/27/18 1800  ceFEPIme (MAXIPIME) 1 g in sodium chloride 0.9 % 100 mL IVPB     1 g 200 mL/hr over 30 Minutes Intravenous Every 24 hours 06/27/18 0852     06/26/18 1700  vancomycin (VANCOCIN) 2,000 mg in sodium chloride  0.9 % 500 mL IVPB     2,000 mg 250 mL/hr over 120 Minutes Intravenous  Once 06/26/18 1626 06/26/18 2141   06/26/18 1700  ceFEPIme (MAXIPIME) 2 g in sodium chloride 0.9 % 100 mL IVPB     2 g 200 mL/hr over 30 Minutes Intravenous  Once 06/26/18 1654 06/26/18 1838   06/26/18 1600  vancomycin (VANCOCIN) IVPB 1000 mg/200  mL premix  Status:  Discontinued     1,000 mg 200 mL/hr over 60 Minutes Intravenous  Once 06/26/18 1559 06/26/18 1626          Objective:   Vitals:   06/27/18 0547 06/27/18 0642 06/27/18 0646 06/27/18 0754  BP: (!) 101/49 (!) 83/65 (!) 97/57 (!) 100/55  Pulse:  (!) 103  94  Resp: 15 (!) 22  (!) 22  Temp: 98.4 F (36.9 C) 98.1 F (36.7 C)  98.2 F (36.8 C)  TempSrc: Oral Oral  Oral  SpO2: 98% 95%  92%  Weight:      Height:        Wt Readings from Last 3 Encounters:  06/27/18 125 kg  06/20/18 125.1 kg  06/15/18 125.1 kg     Intake/Output Summary (Last 24 hours) at 06/27/2018 1030 Last data filed at 06/27/2018 0543 Gross per 24 hour  Intake 4653.15 ml  Output 175 ml  Net 4478.15 ml     Physical Exam  Awake Alert, Oriented X 3, No new F.N deficits, Normal affect [place].AT,PERRAL Supple Neck,No JVD, No cervical lymphadenopathy appriciated.  Symmetrical Chest wall movement, Good air movement bilaterally, CTAB RRR,No Gallops,Rubs or new Murmurs, No Parasternal Heave +ve B.Sounds, Abd Soft, No tenderness, No organomegaly appriciated, No rebound - guarding or rigidity. No Cyanosis, Clubbing or edema, No new Rash or bruise      Data Review:    CBC Recent Labs  Lab 06/26/18 1305 06/26/18 1629 06/27/18 0126  WBC 21.7*  --  15.9*  HGB 11.9* 10.9* 9.1*  HCT 38.5 32.0* 28.7*  PLT 351  --  269  MCV 91.9  --  90.5  MCH 28.4  --  28.7  MCHC 30.9  --  31.7  RDW 16.7*  --  16.7*  LYMPHSABS  --   --  1.0  MONOABS  --   --  0.6  EOSABS  --   --  0.2  BASOSABS  --   --  0.2*    Chemistries  Recent Labs  Lab 06/26/18 1305 06/26/18 1629 06/27/18 0126  NA 129* 132* 135  K 5.3* 4.8 4.4  CL 95* 101 105  CO2 15*  --  16*  GLUCOSE 366* 343* 197*  BUN 48* 50* 52*  CREATININE 5.87* 6.30* 5.73*  CALCIUM 10.0  --  8.3*  AST 19  --  15  ALT 14  --  12  ALKPHOS 97  --  74  BILITOT 0.6  --  0.6    ------------------------------------------------------------------------------------------------------------------ Recent Labs    06/27/18 0126  CHOL 84  HDL 13*  LDLCALC 24  TRIG 233*  CHOLHDL 6.5    Lab Results  Component Value Date   HGBA1C 8.7 (A) 06/20/2018   ------------------------------------------------------------------------------------------------------------------ No results for input(s): TSH, T4TOTAL, T3FREE, THYROIDAB in the last 72 hours.  Invalid input(s): FREET3 ------------------------------------------------------------------------------------------------------------------ No results for input(s): VITAMINB12, FOLATE, FERRITIN, TIBC, IRON, RETICCTPCT in the last 72 hours.  Coagulation profile Recent Labs  Lab 06/26/18 1448 06/27/18 0126 06/27/18 0829  INR 4.82* 4.22* 2.84    No results  for input(s): DDIMER in the last 72 hours.  Cardiac Enzymes No results for input(s): CKMB, TROPONINI, MYOGLOBIN in the last 168 hours.  Invalid input(s): CK ------------------------------------------------------------------------------------------------------------------ No results found for: BNP  Micro Results Recent Results (from the past 240 hour(s))  Blood Culture (routine x 2)     Status: None (Preliminary result)   Collection Time: 06/26/18  4:21 PM  Result Value Ref Range Status   Specimen Description BLOOD SITE NOT SPECIFIED  Final   Special Requests   Final    BOTTLES DRAWN AEROBIC AND ANAEROBIC Blood Culture adequate volume   Culture   Final    NO GROWTH < 24 HOURS Performed at [facility], [address] [place], [place] [address]    Report Status PENDING  Incomplete  Blood Culture (routine x 2)     Status: None (Preliminary result)   Collection Time: 06/26/18  5:52 PM  Result Value Ref Range Status   Specimen Description BLOOD LEFT ARM  Final   Special Requests   Final    BOTTLES DRAWN AEROBIC AND ANAEROBIC Blood Culture adequate  volume   Culture   Final    NO GROWTH < 24 HOURS Performed at [facility], [place] [address] [place], [place] [address]    Report Status PENDING  Incomplete    Radiology Reports Dg Chest 2 View  Result Date: 06/26/2018 CLINICAL DATA:  shortness of breath, n/v and rt flank pain for two days, weak,hx hematuria EXAM: CHEST - 2 VIEW COMPARISON:  None. FINDINGS: The heart size and mediastinal contours are within normal limits. Both lungs are clear. The visualized skeletal structures are unremarkable. IMPRESSION: No active cardiopulmonary disease. Electronically Signed   By: Nolon Nations M.D.   On: 06/26/2018 13:38   Ct Angio Chest/abd/pel For Dissection W And/or Wo Contrast  Result Date: 06/26/2018 CLINICAL DATA:  42 year old with shortness of breath. Evaluate for aortic dissection. EXAM: CT ANGIOGRAPHY CHEST, ABDOMEN AND PELVIS TECHNIQUE: Multidetector CT imaging through the chest, abdomen and pelvis was performed using the standard protocol during bolus administration of intravenous contrast. Multiplanar reconstructed images and MIPs were obtained and reviewed to evaluate the vascular anatomy. CONTRAST:  183mL ISOVUE-370 IOPAMIDOL (ISOVUE-370) INJECTION 76% COMPARISON:  05/12/2018 and chest CT 08/26/2014 FINDINGS: CTA CHEST FINDINGS Cardiovascular: Normal caliber of the thoracic aorta without dissection or intramural hematoma. Heart size is normal without significant pericardial fluid. Pulmonary arteries are not opacified with contrast. Typical aortic arch anatomy. Visualized great vessels are patent. Mediastinum/Nodes: Mediastinal structures are unremarkable. No evidence for mediastinal, hilar or axillary lymphadenopathy. Lungs/Pleura: Trachea and mainstem bronchi are patent. No large pleural effusions. Atelectasis in the right lower lobe. There are 2 stable nodular densities along the right major fissure since 2015. The largest measures 7 mm on sequence 11, image 62. These are compatible with  benign findings based on the stability. Stable pleural-based nodularity along the anterior right lung on sequence 11, image 75. Nodular density along the left side of the heart on sequence 11, image 90 is probably unchanged since 2015. No large areas of airspace disease or lung consolidation. Musculoskeletal: No acute bone abnormality. Review of the MIP images confirms the above findings. CTA ABDOMEN AND PELVIS FINDINGS VASCULAR Aorta: Abdominal aorta is patent without dissection. Limited evaluation of the aorta due to poor contrast opacification and body habitus. Celiac: Celiac trunk and main branch vessels are patent. SMA: SMA is patent but limited evaluation. Renals: Flow in bilateral renal arteries but limited evaluation. IMA:  Limited evaluation. Inflow: Again noted is a stent left common iliac artery. Left common iliac artery stent appears to be patent. There appears to be flow in the left external iliac artery. Limited evaluation of the right common iliac artery. There is flow in the right external iliac artery. Surgical clips of the left groin. Flow in the proximal bilateral femoral arteries but limited evaluation. Veins: No obvious venous abnormality within the limitations of this arterial phase study. Review of the MIP images confirms the above findings. NON-VASCULAR Hepatobiliary: Distention of the gallbladder. No gross abnormality to the liver but limited evaluation due to streak artifact. Pancreas: Unremarkable. No pancreatic ductal dilatation or surrounding inflammatory changes. Spleen: Normal in size without focal abnormality. Adrenals/Urinary Tract: Normal appearance of the right adrenal gland. There is stable fullness and nodularity in the left adrenal gland measuring up to 1.7 cm and compatible with an adenoma based on exam from 08/17/2015. There is new right perinephric edema. There is a new gas in the bilateral renal collecting systems. Decreased enhancement scattered throughout both kidneys,  particularly the right kidney. Findings are suggestive for pyelonephritis. Focal pyelonephritis in the left kidney upper pole. There are several small bilateral renal calculi. There is extensive cortical thinning and scarring in the left kidney. There is now a small stone in the proximal right ureter measuring roughly 6 mm. There is new mild right hydronephrosis. One or two small stones in the right renal pelvis. Minimal dilatation of left renal pelvis. Stomach/Bowel: No acute inflammation involving the bowel structures. Stomach is unremarkable. Lymphatic: No significant lymph node enlargement. Reproductive: Stable appearance of the uterus and adnexal structures. Right adnexa is mildly prominent but poorly characterized on this examination. Other: Trace fluid in the pelvis.  Negative for free air. Musculoskeletal: No acute abnormality. Review of the MIP images confirms the above findings. IMPRESSION: 1. Bilateral pyelonephritis with emphysematous pyelitis. Gas within the bilateral renal collecting systems and urinary bladder. 2. Bilateral renal calculi with 6 mm stone in the proximal right ureter. Mild right hydronephrosis. 3. Negative for an aortic dissection. Limited evaluation of the abdominopelvic vascular structures. The left common iliac artery stent appears to be patent. 4. Extensive scarring in the left kidney. These results were called by telephone at the time of interpretation on 06/26/2018 at 7:26 pm to Dr. Tomi Bamberger , who verbally acknowledged these results. Electronically Signed   By: Markus Daft M.D.   On: 06/26/2018 19:26    Time Spent in minutes  30   Lala Lund M.D on 06/27/2018 at 10:30 AM  To page go to [email or web address] - password [email or web address]

## 2018-06-27 NOTE — Progress Notes (Signed)
As discussed in H&P, pt is on warfarin for iliac thrombosis, was given 2 units FFP in ED with goal of INR <2 for IR to place nephrostomy tube. Repeat INR is 4.2. Two more units FFP and vit K will be given now, and INR will be repeated post-transfusion.

## 2018-06-27 NOTE — Consult Note (Signed)
Chief Complaint: Patient was seen in consultation today for right percutaneous nephrostomy placement  Chief Complaint  Patient presents with  . Shortness of Breath   at the request of Dr Loletha Grayer Lovena Neighbours   Supervising Physician: Jacqulynn Cadet  Patient Status: [facility] - In-pt  History of Present Illness: Terri Sparks is a 42 y.o. 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 Bilateral pyelonephritis with emphysematous pyelitis. Gas within the bilateral renal collecting systems and urinary bladder. 2. Bilateral renal calculi with 6 mm stone in the proximal right ureter. Mild right hydronephrosis. 3. Negative for an aortic dissection. Limited evaluation of the abdominopelvic vascular structures. The left common iliac artery stent appears to be patent. 4. Extensive scarring in the left kidney.  Called to Dr Vernard Gambles on call-- request for Rt PCN Reviewed imaging and deemed appropriate for right percutaneous nephrostomy placement INR 4.2 last night FFP and Vit K Now stat INR pending for today Will need close to wnl to safely proceed   Past Medical History:  Diagnosis Date  . Anxiety   . Diabetes ([place])   . GERD (gastroesophageal reflux disease)   . History of blood clots   . Hypercalcemia   . Hyperparathyroidism   . Nephrolithiasis   . Renal calculi     Past Surgical History:  Procedure Laterality Date  . ABDOMINAL AORTOGRAM W/LOWER EXTREMITY N/A 11/14/2017   Procedure: ABDOMINAL AORTOGRAM W/LOWER EXTREMITY;  Surgeon: Waynetta Sandy, MD;  Location: [facility];  Service: Cardiovascular;  Laterality: N/A;  . ANGIOPLASTY ILLIAC ARTERY Left 02/25/2018   Procedure: BALLOON ANGIOPLASTY LEFT ILIAC ARTERY;  Surgeon: Conrad Excello, MD;  Location: [place];  Service: Vascular;   Laterality: Left;  . APPLICATION OF WOUND VAC Left 03/03/2018   Procedure: APPLICATION OF WOUND VAC;  Surgeon: Angelia Mould, MD;  Location: [place];  Service: Vascular;  Laterality: Left;  . EMBOLECTOMY Left 02/24/2017   Procedure: Left Lower Extremity Embolectomy and Angiogram.;  Surgeon: Waynetta Sandy, MD;  Location: [place];  Service: Vascular;  Laterality: Left;  . INSERTION OF ILIAC STENT  02/24/2017   Procedure: INSERTION OF Common ILIAC STENT;  Surgeon: Waynetta Sandy, MD;  Location: [place];  Service: Vascular;;  . INTRAOPERATIVE ARTERIOGRAM Left 02/25/2018   Procedure: INTRA OPERATIVE ARTERIOGRAM WITH LEFT LEG RUNOFF;  Surgeon: Conrad Bellevue, MD;  Location: [place];  Service: Vascular;  Laterality: Left;  . LOWER EXTREMITY ANGIOGRAM Left 02/24/2017   Procedure: Aortagram, Left lower extremity Run-off;  Surgeon: Waynetta Sandy, MD;  Location: [place];  Service: Vascular;  Laterality: Left;  . PATCH ANGIOPLASTY Left 02/25/2018   Procedure: PATCH ANGIOPLASTY LEFT SUPERFICIAL FEMORAL ARTERY WITH BOVINE PATCH;  Surgeon: Conrad Caliente, MD;  Location: [place];  Service: Vascular;  Laterality: Left;  . PERCUTANEOUS VENOUS THROMBECTOMY,LYSIS WITH INTRAVASCULAR ULTRASOUND (IVUS) Left 05/12/2018   Procedure: MECHANICAL THROMBECTOMY LEFT LEG, BALLOON ANGIOPLASTY LEFT ANTERIOR TIBIAL ARTERY, AORTOGRAM WITH LEFT LEG RUNOFF;  Surgeon: Serafina Mitchell, MD;  Location: [place];  Service: Vascular;  Laterality: Left;  . 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 THROMBECTOMY FEMORAL ARTERY Left 02/25/2018   Procedure: LEFT ILIAC AND POPLITEAL ARTERY THROMBECTOMY;  Surgeon: Conrad Thorp, MD;  Location: [place];  Service: Vascular;  Laterality: Left;  .  TUBAL LIGATION    . WOUND DEBRIDEMENT Left 03/03/2018   Procedure: DEBRIDEMENT WOUND;  Surgeon: Angelia Mould, MD;  Location: [place];  Service: Vascular;  Laterality: Left;  . WOUND EXPLORATION Left 03/03/2018   Procedure: WOUND  EXPLORATION;  Surgeon: Angelia Mould, MD;  Location: [place];  Service: Vascular;  Laterality: Left;    Allergies: Ciprofloxacin hcl; Macrobid [nitrofurantoin macrocrystal]; Other; Penicillins; Sulfa antibiotics; Dilaudid [hydromorphone hcl]; and Lexapro [escitalopram oxalate]  Medications: Prior to Admission medications   Medication Sig Start Date End Date Taking? Authorizing Provider  acetaminophen (TYLENOL) 500 MG tablet Take 1,000 mg by mouth every 6 (six) hours as needed (for pain).    Yes [provider]  albuterol (PROAIR HFA) 108 (90 Base) MCG/ACT inhaler Inhale 2 puffs into the lungs every 6 (six) hours as needed for wheezing or shortness of breath.   Yes [provider]  aspirin EC 81 MG tablet Take 81 mg by mouth daily.   Yes [provider]  buPROPion (BUPROBAN) 150 MG 12 hr tablet Take 1 tablet (150 mg total) by mouth 2 (two) times daily. 01/27/18  Yes Ladell Pier, MD  diphenhydrAMINE (BENADRYL) 25 MG tablet Take 25 mg by mouth every 6 (six) hours as needed for allergies.    Yes [provider]  docusate sodium (COLACE) 100 MG capsule Take 100 mg by mouth daily.   Yes [provider]  ferrous sulfate 325 (65 FE) MG tablet Take 1 tablet (325 mg total) by mouth daily with breakfast. 01/31/18  Yes Ladell Pier, MD  gabapentin (NEURONTIN) 300 MG capsule Take 2 capsules (600 mg total) by mouth 2 (two) times daily. 06/20/18  Yes Ladell Pier, MD  hydrOXYzine (ATARAX/VISTARIL) 25 MG tablet TAKE 1 TABLET BY MOUTH ONCE IN THE MORNING, 1 TABLET AT NOON, AND 2 TABLETS IN THE EVENING AS NEEDED Patient taking differently: Take 25-50 mg by mouth See admin instructions. Take 25 mg by mouth in the morning then 25 mg at noon then 50 mg at bedtime as needed for anxiety or itching 06/08/18  Yes Ladell Pier, MD  insulin aspart (NOVOLOG FLEXPEN) 100 UNIT/ML FlexPen 5-15 units subcut TID with meals Patient taking differently: Inject  5-15 Units into the skin 3 (three) times daily with meals.  06/20/18  Yes Ladell Pier, MD  Insulin Glargine (LANTUS SOLOSTAR) 100 UNIT/ML Solostar Pen Inject 70 Units into the skin daily. Patient taking differently: Inject 70 Units into the skin at bedtime.  06/20/18  Yes Ladell Pier, MD  liraglutide (VICTOZA) 18 MG/3ML SOPN Inject 0.1 mLs (0.6 mg total) into the skin daily. 03/14/18  Yes Ladell Pier, MD  lisinopril (PRINIVIL,ZESTRIL) 5 MG tablet Take 0.5 tablets (2.5 mg total) by mouth daily. 09/29/17  Yes Ladell Pier, MD  megestrol (MEGACE) 40 MG tablet Take 2 tablets (80 mg total) by mouth daily. 06/15/18  Yes Truett Mainland, DO  naproxen sodium (ALEVE) 220 MG tablet Take 220-440 mg by mouth 2 (two) times daily as needed (for pain).   Yes [provider]  omeprazole (PRILOSEC) 20 MG capsule Take 2 capsules (40 mg total) by mouth daily. Patient taking differently: Take 40 mg by mouth 2 (two) times daily as needed (for reflux).  05/29/18  Yes Ladell Pier, MD  traMADol (ULTRAM) 50 MG tablet Take 1 tablet (50 mg total) by mouth daily as needed. Patient taking differently: Take 50 mg by mouth daily as needed (for pain).  06/20/18  Yes Ladell Pier, MD  warfarin (COUMADIN) 5 MG tablet Take 1 tablet (5 mg total) by mouth See admin instructions. 5 mg Sun, Mon , wed,  Fri 10 mg true, Thurs sat Patient taking differently: Take 5-10 mg by mouth See admin instructions. Take 5 mg by mouth at 4 PM daily on Sun/Mon/Wed/Fri and 10 mg on Tues/Thurs/Sat 05/18/18  Yes Ulyses Amor, PA-C  blood glucose meter kit and supplies Dispense based on patient and insurance preference. Use up to four times daily as directed. (FOR ICD-9 250.00, 250.01). 03/03/17   Cristal Ford, DO  Blood Glucose Monitoring Suppl (TRUE METRIX METER) w/Device KIT Use as directed 08/01/17   Ladell Pier, MD  dexamethasone (DECADRON) 1 MG tablet 1 tab PO at 11 p.m night before lab test.  06/23/18   Ladell Pier, MD  glucose blood (TRUE METRIX BLOOD GLUCOSE TEST) test strip Use as instructed 08/01/17   Ladell Pier, MD  glucose blood test strip Use as instructed 10/10/16   Muthersbaugh, Jarrett Soho, PA-C  Insulin Pen Needle 31G X 5 MM MISC Use with Lantus and Novolog injections. 06/20/18   Ladell Pier, MD  Insulin Syringe-Needle U-100 (BD INSULIN SYRINGE ULTRAFINE) 31G X 5/16" 0.5 ML MISC Use as directed 04/28/17   Ladell Pier, MD  nicotine (NICODERM CQ - DOSED IN MG/24 HOURS) 14 mg/24hr patch Place 1 patch (14 mg total) onto the skin daily. Patient not taking: Reported on 06/26/2018 04/28/17   Ladell Pier, MD  ondansetron (ZOFRAN) 4 MG tablet Take 1 tablet (4 mg total) by mouth every 8 (eight) hours as needed for nausea or vomiting. Patient not taking: Reported on 06/26/2018 03/24/18   Ulyses Amor, PA-C  TRUEPLUS LANCETS 28G MISC Use as directed 08/01/17   Ladell Pier, MD     Family History  Problem Relation Age of Onset  . Depression Mother   . Diabetes Father   . Heart failure Father     Social History   Socioeconomic History  . Marital status: Single    Spouse name: Not on file  . Number of children: Not on file  . Years of education: Not on file  . Highest education level: Not on file  Occupational History  . Not on file  Social Needs  . Financial resource strain: Not on file  . Food insecurity:    Worry: Not on file    Inability: Not on file  . Transportation needs:    Medical: Not on file    Non-medical: Not on file  Tobacco Use  . Smoking status: Current Some Day Smoker    Packs/day: 0.25    Years: 15.00    Pack years: 3.75    Types: Cigarettes    Last attempt to quit: 03/04/2017    Years since quitting: 1.3  . Smokeless tobacco: Never Used  Substance and Sexual Activity  . Alcohol use: No  . Drug use: Yes    Types: Marijuana    Comment: occasionally  . Sexual activity: Yes    Partners: Male    Birth  control/protection: Other-see comments    Comment: BTL  Lifestyle  . Physical activity:    Days per week: Not on file    Minutes per session: Not on file  . Stress: Not on file  Relationships  . Social connections:    Talks on phone: Not on file    Gets together: Not on file  Attends religious service: Not on file    Active member of club or organization: Not on file    Attends meetings of clubs or organizations: Not on file    Relationship status: Not on file  Other Topics Concern  . Not on file  Social History Narrative  . Not on file    Review of Systems: A 12 point ROS discussed and pertinent positives are indicated in the HPI above.  All other systems are negative.  Review of Systems  Constitutional: Positive for activity change and appetite change. Negative for fatigue and fever.  Respiratory: Negative for shortness of breath.   Cardiovascular: Negative for chest pain.  Gastrointestinal: Positive for nausea and vomiting.  Genitourinary: Positive for flank pain.  Musculoskeletal: Positive for back pain.  Neurological: Negative for weakness.  Psychiatric/Behavioral: Negative for behavioral problems and confusion.    Vital Signs: BP (!) 100/55 (BP Location: Right Arm)   Pulse 94   Temp 98.2 F (36.8 C) (Oral)   Resp (!) 22   Ht _0  (1.702 m)   Wt 275 lb 9.2 oz (125 kg)   SpO2 92%   BMI 43.16 kg/m   Physical Exam  Constitutional: She is oriented to person, place, and time.  Cardiovascular: Normal rate and regular rhythm.  Pulmonary/Chest: Effort normal.  Abdominal: Soft. Bowel sounds are normal.  Musculoskeletal: Normal range of motion.  Neurological: She is alert and oriented to person, place, and time.  Skin: Skin is warm and dry.  Psychiatric: She has a normal mood and affect. Her behavior is normal.  Vitals reviewed.   Imaging: Dg Chest 2 View  Result Date: 06/26/2018 CLINICAL DATA:  shortness of breath, n/v and rt flank pain for two days,  weak,hx hematuria EXAM: CHEST - 2 VIEW COMPARISON:  None. FINDINGS: The heart size and mediastinal contours are within normal limits. Both lungs are clear. The visualized skeletal structures are unremarkable. IMPRESSION: No active cardiopulmonary disease. Electronically Signed   By: Nolon Nations M.D.   On: 06/26/2018 13:38   Ct Angio Chest/abd/pel For Dissection W And/or Wo Contrast  Result Date: 06/26/2018 CLINICAL DATA:  42 year old with shortness of breath. Evaluate for aortic dissection. EXAM: CT ANGIOGRAPHY CHEST, ABDOMEN AND PELVIS TECHNIQUE: Multidetector CT imaging through the chest, abdomen and pelvis was performed using the standard protocol during bolus administration of intravenous contrast. Multiplanar reconstructed images and MIPs were obtained and reviewed to evaluate the vascular anatomy. CONTRAST:  158m ISOVUE-370 IOPAMIDOL (ISOVUE-370) INJECTION 76% COMPARISON:  05/12/2018 and chest CT 08/26/2014 FINDINGS: CTA CHEST FINDINGS Cardiovascular: Normal caliber of the thoracic aorta without dissection or intramural hematoma. Heart size is normal without significant pericardial fluid. Pulmonary arteries are not opacified with contrast. Typical aortic arch anatomy. Visualized great vessels are patent. Mediastinum/Nodes: Mediastinal structures are unremarkable. No evidence for mediastinal, hilar or axillary lymphadenopathy. Lungs/Pleura: Trachea and mainstem bronchi are patent. No large pleural effusions. Atelectasis in the right lower lobe. There are 2 stable nodular densities along the right major fissure since 2015. The largest measures 7 mm on sequence 11, image 62. These are compatible with benign findings based on the stability. Stable pleural-based nodularity along the anterior right lung on sequence 11, image 75. Nodular density along the left side of the heart on sequence 11, image 90 is probably unchanged since 2015. No large areas of airspace disease or lung consolidation.  Musculoskeletal: No acute bone abnormality. Review of the MIP images confirms the above findings. CTA ABDOMEN AND PELVIS FINDINGS VASCULAR Aorta:  Abdominal aorta is patent without dissection. Limited evaluation of the aorta due to poor contrast opacification and body habitus. Celiac: Celiac trunk and main branch vessels are patent. SMA: SMA is patent but limited evaluation. Renals: Flow in bilateral renal arteries but limited evaluation. IMA: Limited evaluation. Inflow: Again noted is a stent left common iliac artery. Left common iliac artery stent appears to be patent. There appears to be flow in the left external iliac artery. Limited evaluation of the right common iliac artery. There is flow in the right external iliac artery. Surgical clips of the left groin. Flow in the proximal bilateral femoral arteries but limited evaluation. Veins: No obvious venous abnormality within the limitations of this arterial phase study. Review of the MIP images confirms the above findings. NON-VASCULAR Hepatobiliary: Distention of the gallbladder. No gross abnormality to the liver but limited evaluation due to streak artifact. Pancreas: Unremarkable. No pancreatic ductal dilatation or surrounding inflammatory changes. Spleen: Normal in size without focal abnormality. Adrenals/Urinary Tract: Normal appearance of the right adrenal gland. There is stable fullness and nodularity in the left adrenal gland measuring up to 1.7 cm and compatible with an adenoma based on exam from 08/17/2015. There is new right perinephric edema. There is a new gas in the bilateral renal collecting systems. Decreased enhancement scattered throughout both kidneys, particularly the right kidney. Findings are suggestive for pyelonephritis. Focal pyelonephritis in the left kidney upper pole. There are several small bilateral renal calculi. There is extensive cortical thinning and scarring in the left kidney. There is now a small stone in the proximal right  ureter measuring roughly 6 mm. There is new mild right hydronephrosis. One or two small stones in the right renal pelvis. Minimal dilatation of left renal pelvis. Stomach/Bowel: No acute inflammation involving the bowel structures. Stomach is unremarkable. Lymphatic: No significant lymph node enlargement. Reproductive: Stable appearance of the uterus and adnexal structures. Right adnexa is mildly prominent but poorly characterized on this examination. Other: Trace fluid in the pelvis.  Negative for free air. Musculoskeletal: No acute abnormality. Review of the MIP images confirms the above findings. IMPRESSION: 1. Bilateral pyelonephritis with emphysematous pyelitis. Gas within the bilateral renal collecting systems and urinary bladder. 2. Bilateral renal calculi with 6 mm stone in the proximal right ureter. Mild right hydronephrosis. 3. Negative for an aortic dissection. Limited evaluation of the abdominopelvic vascular structures. The left common iliac artery stent appears to be patent. 4. Extensive scarring in the left kidney. These results were called by telephone at the time of interpretation on 06/26/2018 at 7:26 pm to Dr. Tomi Bamberger , who verbally acknowledged these results. Electronically Signed   By: Markus Daft M.D.   On: 06/26/2018 19:26    Labs:  CBC: Recent Labs    05/18/18 0328 05/19/18 0308 06/26/18 1305 06/26/18 1629 06/27/18 0126  WBC 9.7 10.2 21.7*  --  15.9*  HGB 9.3* 9.3* 11.9* 10.9* 9.1*  HCT 31.3* 31.5* 38.5 32.0* 28.7*  PLT 384 391 351  --  269    COAGS: Recent Labs    02/25/18 0130  05/29/18 1531 06/12/18 1420 06/26/18 1448 06/27/18 0126  INR 0.98   < > 2.1 2.4 4.82* 4.22*  APTT 28  --   --   --   --   --    < > = values in this interval not displayed.    BMP: Recent Labs    05/13/18 0022 05/16/18 0343 06/26/18 1305 06/26/18 1629 06/27/18 0126  NA 136 138 129* 132*  135  K 4.7 4.4 5.3* 4.8 4.4  CL 108 98 95* 101 105  CO2 21* 28 15*  --  16*  GLUCOSE 291*  280* 366* 343* 197*  BUN 10 13 48* 50* 52*  CALCIUM 8.5* 9.9 10.0  --  8.3*  CREATININE 1.37* 1.55* 5.87* 6.30* 5.73*  GFRNONAA 46* 40* 8*  --  8*  GFRAA 54* 46* 9*  --  10*    LIVER FUNCTION TESTS: Recent Labs    01/27/18 1157 05/12/18 0613 06/26/18 1305 06/27/18 0126  BILITOT <0.2 0.5 0.6 0.6  AST 11 14* 19 15  ALT _0 ALKPHOS 119* 85 97 74  PROT 6.8 6.8 7.2 5.7*  ALBUMIN 4.0 3.4* 2.8* 2.2*    TUMOR MARKERS: No results for input(s): AFPTM, CEA, CA199, CHROMGRNA in the last 8760 hours.  Assessment and Plan:  Right hydronephrosis Renal stone Cr rising INR 4.2 this am--- rechecking after transfusion Vit K and FFP Scheduled for right percutaneous nephrostomy drain placement  Risks and benefits of Rt PCN were discussed with the patient including, but not limited to, infection, bleeding, significant bleeding causing loss or decrease in renal function or damage to adjacent structures.  All of the patient's questions were answered, patient is agreeable to proceed. Consent signed and in chart.  Thank you for this interesting consult.  I greatly enjoyed meeting [employer] and look forward to participating in their care.  A copy of this report was sent to the requesting provider on this date.  Electronically Signed: Lavonia Drafts, PA-C 06/27/2018, 9:04 AM   I spent a total of 40 Minutes    in face to face in clinical consultation, greater than 50% of which was counseling/coordinating care for Rt PCN

## 2018-06-27 NOTE — Op Note (Signed)
Operative Note  Preoperative diagnosis:  1.  Obstructing 64mm right UPJ calculus 2.  Bilateral emphysematous pyelitis with severe sepsis  Postoperative diagnosis: 1.  Same  Procedure(s): 1.  Cystoscopy with right JJ stent placement  Surgeon: Ellison Hughs, MD  Assistants:  None  Anesthesia:  General  Complications:  None  EBL: Less than 5 mL  Specimens: 1.  None  Drains/Catheters: 1.  Right 6 French by 24 cm JJ stent without tether 2.  18 French Foley catheter with 10 mL in the balloon Intraoperative findings:   1. Right JJ stent good position  Indication:  Terri Sparks is a 42 y.o. female with an obstructing 6 mm right UPJ calculus with associated bilateral emphysematous pyelitis and severe sepsis.  She has been consented for the above procedures, voices understanding wished to proceed.  Description of procedure:  After informed consent was obtained, the patient was brought to the operating room and general LMA anesthesia was administered. The patient was then placed in the dorsolithotomy position and prepped and draped in usual sterile fashion. A timeout was performed. A 23 French rigid cystoscope was then inserted into the urethral meatus and advanced into the bladder under direct vision. A complete bladder survey revealed no intravesical pathology.  A Glidewire was then used to intubate the right ureteral orifice and was advanced up to the right renal pelvis, under fluoroscopic guidance.  A 6 French by 24 cm JJ stent was then advanced over the wire and into good position within the right collecting system, confirming placement via fluoroscopy.  An 61 French Foley catheter was placed with return of turbid urine.  The catheter balloon was inflated with 10 mL of sterile water and set to gravity drainage.  The patient tolerated the procedure well and was transferred to the postanesthesia in stable condition.  Plan: The patient will need at least 2 weeks of culture  specific antibiotics before definitive stone treatment.  The Foley catheter can be removed in 48 hours.  Outpatient follow-up/surgery will be arranged through my office.

## 2018-06-27 NOTE — Progress Notes (Signed)
The patient is booked for a cystoscopy and right JJ stent placement this afternoon at 4:30 pm.

## 2018-06-27 NOTE — Anesthesia Preprocedure Evaluation (Addendum)
Anesthesia Evaluation  Patient identified by MRN, date of birth, ID band Patient awake    Reviewed: Allergy & Precautions, NPO status , Patient's Chart, lab work & pertinent test results  History of Anesthesia Complications Negative for: history of anesthetic complications  Airway Mallampati: II  TM Distance: >3 FB Neck ROM: Full    Dental  (+) Dental Advisory Given, Missing, Loose, Poor Dentition   Pulmonary Current Smoker,    breath sounds clear to auscultation       Cardiovascular hypertension, Pt. on medications + Peripheral Vascular Disease and + DVT   Rhythm:Regular Rate:Normal     Neuro/Psych Anxiety negative neurological ROS     GI/Hepatic Neg liver ROS, GERD  Medicated,  Endo/Other  diabetes, Type 2, Insulin DependentMorbid obesity  Renal/GU ARF and CRFRenal disease Nephrolithiasis  negative genitourinary   Musculoskeletal negative musculoskeletal ROS (+)   Abdominal   Peds  Hematology  (+) anemia ,   Anesthesia Other Findings   Reproductive/Obstetrics  S/p tubal ligation                             Lab Results  Component Value Date   WBC 15.9 (H) 06/27/2018   HGB 9.1 (L) 06/27/2018   HCT 28.7 (L) 06/27/2018   MCV 90.5 06/27/2018   PLT 269 06/27/2018   Lab Results  Component Value Date   CREATININE 5.73 (H) 06/27/2018   BUN 52 (H) 06/27/2018   NA 135 06/27/2018   K 4.4 06/27/2018   CL 105 06/27/2018   CO2 16 (L) 06/27/2018    Anesthesia Physical Anesthesia Plan  ASA: III  Anesthesia Plan: General   Post-op Pain Management:    Induction: Intravenous  PONV Risk Score and Plan: 2 and Dexamethasone, Ondansetron and Treatment may vary due to age or medical condition  Airway Management Planned: LMA  Additional Equipment:   Intra-op Plan:   Post-operative Plan: Extubation in OR  Informed Consent: I have reviewed the patients History and Physical, chart,  labs and discussed the procedure including the risks, benefits and alternatives for the proposed anesthesia with the patient or authorized representative who has indicated his/her understanding and acceptance.   Dental advisory given  Plan Discussed with: CRNA  Anesthesia Plan Comments:         Anesthesia Quick Evaluation

## 2018-06-27 NOTE — Progress Notes (Addendum)
Patient going to OR. Alert and oriented x 4. No acute distress noted. Short stay was called for report.

## 2018-06-27 NOTE — Progress Notes (Signed)
Received pt from ED to 5W15. Tele applied, verified x2. CHG bath completed. HR 120s, retook BP in R wrist after IV removal/completion of bolus and much improved to 118/95 MAP 103. Pt is alert and oriented, w/ warm dry skin. Foley placed and dark, bloody urine returned. Pt c/o 10/10 back pain and PRN fentanyl given. Pt and husband oriented to room and call light. Will continue to monitor.  Jaymes Graff, RN

## 2018-06-27 NOTE — ED Notes (Signed)
MD aware that patient is starting 6th liter of fluid.

## 2018-06-28 ENCOUNTER — Encounter (HOSPITAL_COMMUNITY): Payer: Self-pay | Admitting: Urology

## 2018-06-28 DIAGNOSIS — I745 Embolism and thrombosis of iliac artery: Secondary | ICD-10-CM

## 2018-06-28 DIAGNOSIS — R7881 Bacteremia: Secondary | ICD-10-CM

## 2018-06-28 DIAGNOSIS — N179 Acute kidney failure, unspecified: Secondary | ICD-10-CM

## 2018-06-28 LAB — BPAM FFP
Blood Product Expiration Date: 201909272359
Blood Product Expiration Date: 201909282359
ISSUE DATE / TIME: 201909240345
ISSUE DATE / TIME: 201909240518
Unit Type and Rh: 6200
Unit Type and Rh: 6200

## 2018-06-28 LAB — CBC
HCT: 27.2 % — ABNORMAL LOW (ref 36.0–46.0)
Hemoglobin: 8.3 g/dL — ABNORMAL LOW (ref 12.0–15.0)
MCH: 28.5 pg (ref 26.0–34.0)
MCHC: 30.5 g/dL (ref 30.0–36.0)
MCV: 93.5 fL (ref 78.0–100.0)
Platelets: 236 10*3/uL (ref 150–400)
RBC: 2.91 MIL/uL — ABNORMAL LOW (ref 3.87–5.11)
RDW: 17.6 % — ABNORMAL HIGH (ref 11.5–15.5)
WBC: 13.4 10*3/uL — ABNORMAL HIGH (ref 4.0–10.5)

## 2018-06-28 LAB — BASIC METABOLIC PANEL
Anion gap: 13 (ref 5–15)
BUN: 59 mg/dL — ABNORMAL HIGH (ref 6–20)
CO2: 14 mmol/L — ABNORMAL LOW (ref 22–32)
Calcium: 8 mg/dL — ABNORMAL LOW (ref 8.9–10.3)
Chloride: 102 mmol/L (ref 98–111)
Creatinine, Ser: 5.31 mg/dL — ABNORMAL HIGH (ref 0.44–1.00)
GFR calc Af Amer: 10 mL/min — ABNORMAL LOW (ref >60)
GFR calc non Af Amer: 9 mL/min — ABNORMAL LOW (ref >60)
Glucose, Bld: 168 mg/dL — ABNORMAL HIGH (ref 70–99)
Potassium: 4.5 mmol/L (ref 3.5–5.1)
Sodium: 129 mmol/L — ABNORMAL LOW (ref 135–145)

## 2018-06-28 LAB — PREPARE FRESH FROZEN PLASMA

## 2018-06-28 LAB — GLUCOSE, CAPILLARY
Glucose-Capillary: 147 mg/dL — ABNORMAL HIGH (ref 70–99)
Glucose-Capillary: 152 mg/dL — ABNORMAL HIGH (ref 70–99)
Glucose-Capillary: 112 mg/dL — ABNORMAL HIGH (ref 70–99)
Glucose-Capillary: 140 mg/dL — ABNORMAL HIGH (ref 70–99)
Glucose-Capillary: 167 mg/dL — ABNORMAL HIGH (ref 70–99)
Glucose-Capillary: 160 mg/dL — ABNORMAL HIGH (ref 70–99)

## 2018-06-28 LAB — HEPARIN LEVEL (UNFRACTIONATED): Heparin Unfractionated: <0.1 IU/mL — ABNORMAL LOW (ref 0.30–0.70)

## 2018-06-28 LAB — PROTIME-INR
INR: 1.66
Prothrombin Time: 19.5 seconds — ABNORMAL HIGH (ref 11.4–15.2)

## 2018-06-28 LAB — MAGNESIUM: Magnesium: 1.5 mg/dL — ABNORMAL LOW (ref 1.7–2.4)

## 2018-06-28 MED ORDER — ALBUTEROL SULFATE HFA 108 (90 BASE) MCG/ACT IN AERS
2.0000 | INHALATION_SPRAY | Freq: Four times a day (QID) | RESPIRATORY_TRACT | Status: DC | PRN
  Administered 2018-06-30 – 2018-07-02 (×2): 2 via RESPIRATORY_TRACT
  Filled 2018-06-28: qty 6.7

## 2018-06-28 MED ORDER — SODIUM BICARBONATE 8.4 % IV SOLN: INTRAVENOUS | Status: DC

## 2018-06-28 MED ORDER — SODIUM BICARBONATE 8.4 % IV SOLN
INTRAVENOUS | Status: DC
  Administered 2018-06-28 – 2018-06-29 (×2): via INTRAVENOUS
  Filled 2018-06-28 (×5): qty 150

## 2018-06-28 MED ORDER — HYDROXYZINE HCL 25 MG PO TABS
25.0000 mg | ORAL_TABLET | Freq: Four times a day (QID) | ORAL | Status: DC | PRN
  Administered 2018-06-28 – 2018-07-04 (×5): 25 mg via ORAL
  Filled 2018-06-28 (×5): qty 1

## 2018-06-28 MED ORDER — OXYBUTYNIN CHLORIDE 5 MG PO TABS: 5.0000 mg | ORAL_TABLET | Freq: Three times a day (TID) | ORAL | 1 refills | Status: DC | PRN

## 2018-06-28 MED ORDER — SENNA 8.6 MG PO TABS
2.0000 | ORAL_TABLET | Freq: Every day | ORAL | Status: DC
  Administered 2018-06-28 – 2018-07-05 (×7): 17.2 mg via ORAL
  Filled 2018-06-28 (×8): qty 2

## 2018-06-28 MED ORDER — MORPHINE SULFATE (PF) 2 MG/ML IV SOLN
2.0000 mg | INTRAVENOUS | Status: DC | PRN
  Administered 2018-06-28 – 2018-07-05 (×28): 2 mg via INTRAVENOUS
  Filled 2018-06-28 (×28): qty 1

## 2018-06-28 MED ORDER — HEPARIN (PORCINE) IN NACL 100-0.45 UNIT/ML-% IJ SOLN
2100.0000 [IU]/h | INTRAMUSCULAR | Status: DC
  Administered 2018-06-28: 1400 [IU]/h via INTRAVENOUS
  Filled 2018-06-28 (×2): qty 250

## 2018-06-28 MED ORDER — PHENAZOPYRIDINE HCL 200 MG PO TABS: 200.0000 mg | ORAL_TABLET | Freq: Three times a day (TID) | ORAL | 0 refills | Status: DC | PRN

## 2018-06-28 MED ORDER — OXYCODONE-ACETAMINOPHEN 5-325 MG PO TABS
1.0000 | ORAL_TABLET | Freq: Four times a day (QID) | ORAL | Status: DC | PRN
  Administered 2018-06-28 – 2018-07-05 (×23): 2 via ORAL
  Filled 2018-06-28 (×23): qty 2

## 2018-06-28 NOTE — Evaluation (Signed)
Physical Therapy Evaluation & Discharge Patient Details Name: Terri Sparks MRN: [id number] DOB: June 16, 1976 Today's Date: 06/28/2018   History of Present Illness  Pt is a 42 y.o. female admitted 06/26/18 with fever and R flank pain; found to have severe sepsis secondary to bilateral pyelonephritis. CT showed uretal stone. Now s/p cystoscopy with R stent placement 9/24. 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    Clinical Impression  Patient evaluated by Physical Therapy with no further acute PT needs identified. PTA, pt indep with intermittent use of SPC; lives with family. Today, pt mod indep with mobility; indep with ADLs. Encouraged BLE elevation secondary to ankle edema. All education has been completed and the patient has no further questions. Encouraged frequent ambulation during hospital admission. PT is signing off. Thank you for this referral.   Follow Up Recommendations No PT follow up;Supervision - Intermittent    Equipment Recommendations  None recommended by PT    Recommendations for Other Services       Precautions / Restrictions Precautions Precautions: None Restrictions Weight Bearing Restrictions: No      Mobility  Bed Mobility Overal bed mobility: Modified Independent             General bed mobility comments: HOB elevated  Transfers Overall transfer level: Independent Equipment used: None                Ambulation/Gait Ambulation/Gait assistance: Modified independent (Device/Increase time)   Assistive device: IV Pole Gait Pattern/deviations: Step-through pattern;Wide base of support   Gait velocity interpretation: 1.31 - 2.62 ft/sec, indicative of limited community ambulator General Gait Details: Amb throughout room mod indep with intermittent UE support; pt prefers to hold onto IV pole  Stairs Stairs: (Pt declined; reports no issues with these)          Wheelchair Mobility     Modified Rankin (Stroke Patients Only)       Balance Overall balance assessment: No apparent balance deficits (not formally assessed)                                           Pertinent Vitals/Pain Pain Assessment: Faces Faces Pain Scale: Hurts even more Pain Location: R flank into groin Pain Descriptors / Indicators: Discomfort Pain Intervention(s): Limited activity within patient's tolerance    Home Living                        Prior Function                 Hand Dominance        Extremity/Trunk Assessment   Upper Extremity Assessment Upper Extremity Assessment: Overall WFL for tasks assessed    Lower Extremity Assessment Lower Extremity Assessment: Overall WFL for tasks assessed(bilat ankle edema)    Cervical / Trunk Assessment Cervical / Trunk Assessment: Normal  Communication      Cognition Arousal/Alertness: Awake/alert Behavior During Therapy: WFL for tasks assessed/performed Overall Cognitive Status: Within Functional Limits for tasks assessed                                        General Comments      Exercises     Assessment/Plan    PT Assessment Patent does  not need any further PT services  PT Problem List         PT Treatment Interventions      PT Goals (Current goals can be found in the Care Plan section)  Acute Rehab PT Goals PT Goal Formulation: All assessment and education complete, DC therapy    Frequency     Barriers to discharge        Co-evaluation               AM-PAC PT "6 Clicks" Daily Activity  Outcome Measure Difficulty turning over in bed (including adjusting bedclothes, sheets and blankets)?: None Difficulty moving from lying on back to sitting on the side of the bed? : None Difficulty sitting down on and standing up from a chair with arms (e.g., wheelchair, bedside commode, etc,.)?: None Help needed moving to and from a bed to chair (including a  wheelchair)?: None Help needed walking in hospital room?: None Help needed climbing 3-5 steps with a railing? : A Little 6 Click Score: 23    End of Session   Activity Tolerance: Patient tolerated treatment well;Patient limited by fatigue Patient left: in bed;with call bell/phone within reach;with family/visitor present Nurse Communication: Mobility status PT Visit Diagnosis: Other abnormalities of gait and mobility (R26.89)    Time: 4628-6381 PT Time Calculation (min) (ACUTE ONLY): 13 min   Charges:   PT Evaluation $PT Eval Low Complexity: [place], PT, DPT Acute Rehabilitation Services  Pager [phone] Office [place] 06/28/2018, 5:37 PM

## 2018-06-28 NOTE — Progress Notes (Signed)
1 Day Post-Op Subjective: C/o bladder spasms, otherwise voiding w/o difficulty.  Had some leakage around her catheter last night, which prompted removal from the nursing staff.  Denies flank pain, dysuria and states that her urine is clearing.  Creatinine and WBC improving.  UOP appropriate.   Objective: Vital signs in last 24 hours: Temp:  [97.7 F (36.5 C)-99.6 F (37.6 C)] 98.2 F (36.8 C) (09/25 1403) Pulse Rate:  [95-119] 97 (09/25 1403) Resp:  [17-24] 18 (09/25 1403) BP: (101-117)/(39-75) 110/69 (09/25 1403) SpO2:  [93 %-100 %] 100 % (09/25 1403)  Intake/Output from previous day: 09/24 0701 - 09/25 0700 In: 2240.6 [P.O.:581; I.V.:1478.1; IV Piggyback:181.5] Out: 940 [Urine:930; Blood:10]  Intake/Output this shift: No intake/output data recorded.  Physical Exam:  General: Alert and oriented CV: RRR, palpable distal pulses Lungs: CTAB, equal chest rise Abdomen: Soft, NTND, no rebound or guarding Ext: NT, No erythema  Lab Results: Recent Labs    06/26/18 1629 06/27/18 0126 06/28/18 0451  HGB 10.9* 9.1* 8.3*  HCT 32.0* 28.7* 27.2*   BMET Recent Labs    06/27/18 0126 06/28/18 0451  NA 135 129*  K 4.4 4.5  CL 105 102  CO2 16* 14*  GLUCOSE 197* 168*  BUN 52* 59*  CREATININE 5.73* 5.31*  CALCIUM 8.3* 8.0*     Studies/Results: Dg Retrograde Pyelogram  Result Date: 06/28/2018 CLINICAL DATA:  Right-sided ureteral stent placement EXAM: RETROGRADE PYELOGRAM COMPARISON:  CT abdomen pelvis-05/12/2018 FLUOROSCOPY TIME:  19 seconds FINDINGS: A single spot fluoroscopic image of the right mid/lower abdomen is provided for review Provided image demonstrates the superior aspect of a right-sided ureteral stent with superior coil overlying expected location the right renal fossa. The inferior aspect of the stent was not imaged. IMPRESSION: Post right-sided ureteral stent placement. Correlation with the operative report is advised. Electronically Signed   By: Sandi Mariscal M.D.    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  -Final urine and blood cxs pending.  From a GU perspective, she will need two weeks of culture specific abx.  I will arrange OP f/u in 2 weeks to discuss definitive stone treatment.  Continue to monitor renal function and for post-obstructive diuresis--renal function improving after stent placement and with conservative tx.     LOS: 2 days   Ellison Hughs, MD [facility] Pager: [phone]  06/28/2018, 7:42 PM

## 2018-06-28 NOTE — Progress Notes (Signed)
Pt c/o decreased sensation in bilateral lower extremities. Denied pain. Pt stated it feels little worse than neuropathy, and she could not describe exactly how it feels. Pt able to ambulate w/o difficulty, color normal for ethnicity, no redness, pedal pulses unchanged from previous assessment. Pt able to localized touch to bilateral LE. Some LE edema, unchanged to previous assessment. Extremities elevated to help decrease swelling.  MD notified, will continue to monitor pt.

## 2018-06-28 NOTE — Progress Notes (Signed)
[place] for Warfarin to Heparin Indication: thrombus  Allergies  Allergen Reactions  . Ciprofloxacin Hcl Hives  . Macrobid [Nitrofurantoin Macrocrystal] Hives, Shortness Of Breath and Rash  . Other Hives, Shortness Of Breath and Rash    NO "-CILLINS"!!!  . Penicillins Shortness Of Breath    Had had cephalosporins without incident Has patient had a PCN reaction causing immediate rash, facial/tongue/throat swelling, SOB or lightheadedness with hypotension: Yes Has patient had a PCN reaction causing severe rash involving mucus membranes or skin necrosis: Unk Has patient had a PCN reaction that required hospitalization: Unk Has patient had a PCN reaction occurring within the last 10 years: No If all of the above answers are "NO", then may proceed with Cephalosporin use.   . Sulfa Antibiotics Hives, Shortness Of Breath and Rash  . Dilaudid [Hydromorphone Hcl] Other (See Comments)    Asystole per pt report  . Lexapro [Escitalopram Oxalate] Other (See Comments)    "I just did not like it."    Patient Measurements: Height: 5\' 7"  (170.2 cm) Weight: 275 lb 9.2 oz (125 kg) IBW/kg (Calculated) : 61.6  Vital Signs: Temp: 98.2 F (36.8 C) (09/25 0853) Temp Source: Oral (09/25 0853) BP: 101/58 (09/25 0456) Pulse Rate: 114 (09/25 0456)  Labs: Recent Labs    06/26/18 1305  06/26/18 1629 06/27/18 0126 06/27/18 0829 06/27/18 1225 06/28/18 0451  HGB 11.9*  --  10.9* 9.1*  --   --  8.3*  HCT 38.5  --  32.0* 28.7*  --   --  27.2*  PLT 351  --   --  269  --   --  236  LABPROT  --    < >  --  40.3* 29.6* 24.1* 19.5*  INR  --    < >  --  4.22* 2.84 2.18 1.66  CREATININE 5.87*  --  6.30* 5.73*  --   --  5.31*   < > = values in this interval not displayed.    Estimated Creatinine Clearance: 18.8 mL/min (A) (by C-G formula based on SCr of 5.31 mg/dL (H)).   Medical History: Past Medical History:  Diagnosis Date  . Anxiety   . Diabetes ([place])    . GERD (gastroesophageal reflux disease)   . History of blood clots   . Hypercalcemia   . Hyperparathyroidism   . Nephrolithiasis   . Renal calculi     Assessment: 42 year old female to begin heparin while warfarin on hold for iliac thrombus INR today = 1.66  Goal of Therapy:  Heparin level 0.3-0.7 units/ml Monitor platelets by anticoagulation protocol: Yes   Plan:  Heparin drip at 1400 units / hr Heparin level 8 hours after heparin begins Daily heparin level, CBC  Thank you Anette Guarneri, PharmD [phone]  06/28/2018,10:16 AM

## 2018-06-28 NOTE — Progress Notes (Addendum)
PROGRESS NOTE        PATIENT DETAILS Name: Terri Sparks Age: 42 y.o. Sex: female Date of Birth: March 24, 1976 Admit Date: 06/26/2018 Admitting Physician Vianne Bulls, MD IPJ:[id number], Dalbert Batman, MD  Brief Narrative: Patient is a 42 y.o. female with history of insulin-dependent diabetes, history of ischemic leg l felt to be due to recurrent acute thromboembolism-requiring thrombectomy and angioplasty to left tibial artery August 2019-on chronic anticoagulation with warfarin-presented with fever and right flank pain-she was found to have severe sepsis secondary to bilateral pyelonephritis.  CT imaging also showed a 6 mm proximal stone in the right ureter with mild right hydronephrosis.  Urology and IR both were consulted, she subsequently underwent cystoscopy with right stent placement on 9/24.  1 out of 2 blood cultures were subsequently positive for gram-negative rods.  Slowly improving with supportive care-see below for further details.  Subjective: Continues to have some intermittent right flank pain.  No chest pain or shortness of breath.  Thinks she she may have passed the stone this morning.  Assessment/Plan: Sepsis secondary to bilateral pyelonephritis and gram-negative bacteremia: Sepsis pathophysiology slowly improving, awaiting final culture results-1/2 blood culture positive for gram-negative rods.  Continue IV Rocephin-and follow clinical course.  Right ureteral stone with minimal right-sided hydronephrosis: Underwent cystoscopy and a right JJ stent placement on 9/24.  Follow.  Acute kidney injury on CKD stage III: AKI multifactorial-dynamically mediated in the setting of sepsis and also from obstructive uropathy.  Creatinine downtrending-apart from mild metabolic acidosis-electrolytes are stable, good urine output overnight.  Continue supportive care-avoid nephrotoxic agents and follow.  If renal function does not improve-will consult  nephrology.  Non-anion gap metabolic acidosis: Probably secondary to AKI-starting IV fluids with bicarb-follow for now.  Hyponatremia: Stable for now-appears mild-monitor-continue cautious hydration and follow.  Hyperkalemia: Resolved-follow.  Hypertension: Controlled-in fact on the softer side-hold all antihypertensives.  Follow.  Insulin-dependent DM-2: CBG stable-continue Lantus 30 units nightly and SSI.  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  DVT Prophylaxis: IV Heparin  Code Status: Full code   Family Communication: None at bedside  Disposition Plan: Remain inpatient-will require several days of hospitalization before consideration of discharge.  Antimicrobial agents: Anti-infectives (From admission, onward)   Start     Dose/Rate Route Frequency Ordered Stop   06/28/18 1900  vancomycin (VANCOCIN) 1,750 mg in sodium chloride 0.9 % 500 mL IVPB  Status:  Discontinued     1,750 mg 250 mL/hr over 120 Minutes Intravenous Every 48 hours 06/26/18 2022 06/27/18 1319   06/27/18 1800  ceFEPIme (MAXIPIME) 500 mg in dextrose 5 % 50 mL IVPB  Status:  Discontinued     500 mg 100 mL/hr over 30 Minutes Intravenous Every 24 hours 06/26/18 2022 06/27/18 0852   06/27/18 1800  ceFEPIme (MAXIPIME) 1 g in sodium chloride 0.9 % 100 mL IVPB  Status:  Discontinued     1 g 200 mL/hr over 30 Minutes Intravenous Every 24 hours 06/27/18 0852 06/27/18 1319   06/27/18 1500  cefTRIAXone (ROCEPHIN) 2 g in sodium chloride 0.9 % 100 mL IVPB     2 g 200 mL/hr over 30 Minutes Intravenous Every 24  hours 06/27/18 1319     06/26/18 1700  vancomycin (VANCOCIN) 2,000 mg in sodium chloride 0.9 % 500 mL IVPB     2,000 mg 250 mL/hr over 120 Minutes  Intravenous  Once 06/26/18 1626 06/26/18 2141   06/26/18 1700  ceFEPIme (MAXIPIME) 2 g in sodium chloride 0.9 % 100 mL IVPB     2 g 200 mL/hr over 30 Minutes Intravenous  Once 06/26/18 1654 06/26/18 1838   06/26/18 1600  vancomycin (VANCOCIN) IVPB 1000 mg/200 mL premix  Status:  Discontinued     1,000 mg 200 mL/hr over 60 Minutes Intravenous  Once 06/26/18 1559 06/26/18 1626      Procedures: 9/24>>Cystoscopy with right JJ stent placement  CONSULTS:  urology  Time spent: 40- minutes-Greater than 50% of this time was spent in counseling, explanation of diagnosis, planning of further management, and coordination of care.  MEDICATIONS: Scheduled Meds: . sodium chloride   Intravenous Once  . buPROPion  150 mg Oral BID  . gabapentin  200 mg Oral BID  . insulin aspart  0-9 Units Subcutaneous Q4H  . insulin glargine  30 Units Subcutaneous QHS  . megestrol  80 mg Oral Daily  . mupirocin ointment  1 application Nasal BID  . nystatin   Topical QID  . pantoprazole  40 mg Oral Daily  . senna  2 tablet Oral Daily  . sodium chloride flush  3 mL Intravenous Q12H   Continuous Infusions: . cefTRIAXone (ROCEPHIN)  IV 2 g (06/27/18 1528)  . heparin    . lactated ringers 100 mL/hr at 06/27/18 2059  . sodium chloride     PRN Meds:.acetaminophen **OR** [DISCONTINUED] acetaminophen, albuterol, diphenhydrAMINE, morphine injection, [DISCONTINUED] ondansetron **OR** ondansetron (ZOFRAN) IV, oxyCODONE-acetaminophen, sodium chloride   PHYSICAL EXAM: Vital signs: Vitals:   06/28/18 0026 06/28/18 0456 06/28/18 0853 06/28/18 1114  BP: (!) 101/39 (!) 101/58  (!) 104/57  Pulse: (!) 107 (!) 114  95  Resp: 18 17  17   Temp:   98.2 F (36.8 C) 97.7 F (36.5 C)  TempSrc:   Oral Oral  SpO2: 93% 93%  97%  Weight:      Height:       Filed Weights   06/27/18 0200  Weight: 125 kg   Body mass index is 43.16 kg/m.   General appearance :Awake, alert, not in any distress.  Eyes:Pink  conjunctiva HEENT: Atraumatic and Normocephalic Neck: supple Resp:Good air entry bilaterally, no added sounds  CVS: S1 S2 regular, no murmurs.  GI: Bowel sounds present, Non tender and not distended with no gaurding, rigidity or rebound. Extremities: B/L Lower Ext shows no edema Neurology:  speech clear,Non focal, sensation is grossly intact. Musculoskeletal:No digital cyanosis Skin:No Rash, warm and dry Wounds:N/A  I have personally reviewed following labs and imaging studies  LABORATORY DATA: CBC: Recent Labs  Lab 06/26/18 1305 06/26/18 1629 06/27/18 0126 06/28/18 0451  WBC 21.7*  --  15.9* 13.4*  NEUTROABS  --   --  13.9*  --   HGB 11.9* 10.9* 9.1* 8.3*  HCT 38.5 32.0* 28.7* 27.2*  MCV 91.9  --  90.5 93.5  PLT 351  --  269 144    Basic Metabolic Panel: Recent Labs  Lab 06/26/18 1305 06/26/18 1629 06/27/18 0126 06/28/18 0451  NA 129* 132* 135 129*  K 5.3* 4.8 4.4 4.5  CL 95* 101 105 102  CO2 15*  --  16* 14*  GLUCOSE 366* 343* 197* 168*  BUN 48* 50* 52* 59*  CREATININE 5.87* 6.30* 5.73* 5.31*  CALCIUM 10.0  --  8.3* 8.0*  MG  --   --   --  1.5*    GFR: Estimated Creatinine Clearance: 18.8 mL/min (A) (by C-G formula based on SCr of 5.31 mg/dL (H)).  Liver Function Tests: Recent Labs  Lab 06/26/18 1305 06/27/18 0126  AST 19 15  ALT 14 12  ALKPHOS 97 74  BILITOT 0.6 0.6  PROT 7.2 5.7*  ALBUMIN 2.8* 2.2*   Recent Labs  Lab 06/26/18 1305  LIPASE 15   No results for input(s): AMMONIA in the last 168 hours.  Coagulation Profile: Recent Labs  Lab 06/26/18 1448 06/27/18 0126 06/27/18 0829 06/27/18 1225 06/28/18 0451  INR 4.82* 4.22* 2.84 2.18 1.66    Cardiac Enzymes: No results for input(s): CKTOTAL, CKMB, CKMBINDEX, TROPONINI in the last 168 hours.  BNP (last 3 results) No results for input(s): PROBNP in the last 8760 hours.  HbA1C: No results for input(s): HGBA1C in the last 72 hours.  CBG: Recent Labs  Lab 06/27/18 1913  06/27/18 2259 06/28/18 0409 06/28/18 0849 06/28/18 1225  GLUCAP 126* 164* 152* 112* 140*    Lipid Profile: Recent Labs    06/27/18 0126  CHOL 84  HDL 13*  LDLCALC 24  TRIG 233*  CHOLHDL 6.5    Thyroid Function Tests: No results for input(s): TSH, T4TOTAL, FREET4, T3FREE, THYROIDAB in the last 72 hours.  Anemia Panel: No results for input(s): VITAMINB12, FOLATE, FERRITIN, TIBC, IRON, RETICCTPCT in the last 72 hours.  Urine analysis:    Component Value Date/Time   COLORURINE RED (A) 06/26/2018 1317   APPEARANCEUR TURBID (A) 06/26/2018 1317   LABSPEC  06/26/2018 1317    TEST NOT REPORTED DUE TO COLOR INTERFERENCE OF URINE PIGMENT   PHURINE  06/26/2018 1317    TEST NOT REPORTED DUE TO COLOR INTERFERENCE OF URINE PIGMENT   GLUCOSEU (A) 06/26/2018 1317    TEST NOT REPORTED DUE TO COLOR INTERFERENCE OF URINE PIGMENT   HGBUR (A) 06/26/2018 1317    TEST NOT REPORTED DUE TO COLOR INTERFERENCE OF URINE PIGMENT   BILIRUBINUR (A) 06/26/2018 1317    TEST NOT REPORTED DUE TO COLOR INTERFERENCE OF URINE PIGMENT   BILIRUBINUR negative 09/29/2017 1317   KETONESUR (A) 06/26/2018 1317    TEST NOT REPORTED DUE TO COLOR INTERFERENCE OF URINE PIGMENT   PROTEINUR (A) 06/26/2018 1317    TEST NOT REPORTED DUE TO COLOR INTERFERENCE OF URINE PIGMENT   UROBILINOGEN 0.2 06/15/2018 1530   NITRITE (A) 06/26/2018 1317    TEST NOT REPORTED DUE TO COLOR INTERFERENCE OF URINE PIGMENT   LEUKOCYTESUR (A) 06/26/2018 1317    TEST NOT REPORTED DUE TO COLOR INTERFERENCE OF URINE PIGMENT    Sepsis Labs: Lactic Acid, Venous    Component Value Date/Time   LATICACIDVEN 2.5 ([place]) 06/27/2018 0126    MICROBIOLOGY: Recent Results (from the past 240 hour(s))  Blood Culture (routine x 2)     Status: Abnormal (Preliminary result)   Collection Time: 06/26/18  4:21 PM  Result Value Ref Range Status   Specimen Description BLOOD SITE NOT SPECIFIED  Final   Special Requests   Final    BOTTLES DRAWN AEROBIC  AND ANAEROBIC Blood Culture adequate volume   Culture  Setup Time   Final    GRAM NEGATIVE RODS IN BOTH AEROBIC AND ANAEROBIC BOTTLES CRITICAL RESULT CALLED TO, READ BACK BY AND VERIFIED WITH: Hughie Closs PharmD 13:20 06/27/18 ([email or web address])    Culture (A)  Final    ESCHERICHIA COLI SUSCEPTIBILITIES TO FOLLOW Performed at [facility], [place] [address] [place], [place] [address]    Report Status PENDING  Incomplete  Blood Culture ID Panel (Reflexed)     Status: Abnormal   Collection Time: 06/26/18  4:21 PM  Result Value Ref Range Status   Enterococcus species NOT DETECTED NOT DETECTED Final   Listeria monocytogenes NOT DETECTED NOT DETECTED Final   Staphylococcus species NOT DETECTED NOT DETECTED Final   Staphylococcus aureus NOT DETECTED NOT DETECTED Final   Streptococcus species NOT DETECTED NOT DETECTED Final   Streptococcus agalactiae NOT DETECTED NOT DETECTED Final   Streptococcus pneumoniae NOT DETECTED NOT DETECTED Final   Streptococcus pyogenes NOT DETECTED NOT DETECTED Final   Acinetobacter baumannii NOT DETECTED NOT DETECTED Final   Enterobacteriaceae species DETECTED (A) NOT DETECTED Final    Comment: Enterobacteriaceae represent a large family of gram-negative bacteria, not a single organism. CRITICAL RESULT CALLED TO, READ BACK BY AND VERIFIED WITH: Hughie Closs PharmD 13:20 06/27/18 ([email or web address])    Enterobacter cloacae complex NOT DETECTED NOT DETECTED Final   Escherichia coli DETECTED (A) NOT DETECTED Final    Comment: CRITICAL RESULT CALLED TO, READ BACK BY AND VERIFIED WITH: Hughie Closs PharmD 13:20 06/27/18 ([email or web address])    Klebsiella oxytoca NOT DETECTED NOT DETECTED Final   Klebsiella pneumoniae NOT DETECTED NOT DETECTED Final   Proteus species NOT DETECTED NOT DETECTED Final   Serratia marcescens NOT DETECTED NOT DETECTED Final   Carbapenem resistance NOT DETECTED NOT DETECTED Final   Haemophilus influenzae NOT DETECTED NOT DETECTED Final   Neisseria meningitidis NOT  DETECTED NOT DETECTED Final   Pseudomonas aeruginosa NOT DETECTED NOT DETECTED Final   Candida albicans NOT DETECTED NOT DETECTED Final   Candida glabrata NOT DETECTED NOT DETECTED Final   Candida krusei NOT DETECTED NOT DETECTED Final   Candida parapsilosis NOT DETECTED NOT DETECTED Final   Candida tropicalis NOT DETECTED NOT DETECTED Final  Blood Culture (routine x 2)     Status: None (Preliminary result)   Collection Time: 06/26/18  5:52 PM  Result Value Ref Range Status   Specimen Description BLOOD LEFT ARM  Final   Special Requests   Final    BOTTLES DRAWN AEROBIC AND ANAEROBIC Blood Culture adequate volume   Culture   Final    NO GROWTH < 24 HOURS Performed at [facility], [place] [address], [place], [place] [address]    Report Status PENDING  Incomplete  MRSA PCR Screening     Status: None   Collection Time: 06/27/18  9:25 AM  Result Value Ref Range Status   MRSA by PCR NEGATIVE NEGATIVE Final    Comment:        The GeneXpert MRSA Assay (FDA approved for NASAL specimens only), is one component of a comprehensive MRSA colonization surveillance program. It is not intended to diagnose MRSA infection nor to guide or monitor treatment for MRSA infections. Performed at [facility], [place] [address], [place], [place] [address]   Surgical PCR screen     Status: None   Collection Time: 06/27/18  3:34 PM  Result Value Ref Range Status   MRSA, PCR NEGATIVE NEGATIVE Final   Staphylococcus aureus NEGATIVE NEGATIVE Final    Comment: (NOTE) The Xpert SA Assay (FDA approved for NASAL specimens in patients 39 years of age and older), is one component of a comprehensive surveillance program. It is not intended to diagnose infection nor to guide  or monitor treatment. Performed at [facility], [place] [address], [place], [place] [address]     RADIOLOGY STUDIES/RESULTS: Dg Chest 2 View  Result Date: 06/26/2018 CLINICAL DATA:  shortness of breath, n/v and rt flank  pain for two days, weak,hx hematuria EXAM: CHEST - 2 VIEW COMPARISON:  None. FINDINGS: The heart size and mediastinal contours are within normal limits. Both lungs are clear. The visualized skeletal structures are unremarkable. IMPRESSION: No active cardiopulmonary disease. Electronically Signed   By: Nolon Nations M.D.   On: 06/26/2018 13:38   Dg Retrograde Pyelogram  Result Date: 06/28/2018 CLINICAL DATA:  Right-sided ureteral stent placement EXAM: RETROGRADE PYELOGRAM COMPARISON:  CT abdomen pelvis-05/12/2018 FLUOROSCOPY TIME:  19 seconds FINDINGS: A single spot fluoroscopic image of the right mid/lower abdomen is provided for review Provided image demonstrates the superior aspect of a right-sided ureteral stent with superior coil overlying expected location the right renal fossa. The inferior aspect of the stent was not imaged. IMPRESSION: Post right-sided ureteral stent placement. Correlation with the operative report is advised. Electronically Signed   By: Sandi Mariscal M.D.   On: 06/28/2018 07:39   Ct Angio Chest/abd/pel For Dissection W And/or Wo Contrast  Result Date: 06/26/2018 CLINICAL DATA:  42 year old with shortness of breath. Evaluate for aortic dissection. EXAM: CT ANGIOGRAPHY CHEST, ABDOMEN AND PELVIS TECHNIQUE: Multidetector CT imaging through the chest, abdomen and pelvis was performed using the standard protocol during bolus administration of intravenous contrast. Multiplanar reconstructed images and MIPs were obtained and reviewed to evaluate the vascular anatomy. CONTRAST:  142mL ISOVUE-370 IOPAMIDOL (ISOVUE-370) INJECTION 76% COMPARISON:  05/12/2018 and chest CT 08/26/2014 FINDINGS: CTA CHEST FINDINGS Cardiovascular: Normal caliber of the thoracic aorta without dissection or intramural hematoma. Heart size is normal without significant pericardial fluid. Pulmonary arteries are not opacified with contrast. Typical aortic arch anatomy. Visualized great vessels are patent.  Mediastinum/Nodes: Mediastinal structures are unremarkable. No evidence for mediastinal, hilar or axillary lymphadenopathy. Lungs/Pleura: Trachea and mainstem bronchi are patent. No large pleural effusions. Atelectasis in the right lower lobe. There are 2 stable nodular densities along the right major fissure since 2015. The largest measures 7 mm on sequence 11, image 62. These are compatible with benign findings based on the stability. Stable pleural-based nodularity along the anterior right lung on sequence 11, image 75. Nodular density along the left side of the heart on sequence 11, image 90 is probably unchanged since 2015. No large areas of airspace disease or lung consolidation. Musculoskeletal: No acute bone abnormality. Review of the MIP images confirms the above findings. CTA ABDOMEN AND PELVIS FINDINGS VASCULAR Aorta: Abdominal aorta is patent without dissection. Limited evaluation of the aorta due to poor contrast opacification and body habitus. Celiac: Celiac trunk and main branch vessels are patent. SMA: SMA is patent but limited evaluation. Renals: Flow in bilateral renal arteries but limited evaluation. IMA: Limited evaluation. Inflow: Again noted is a stent left common iliac artery. Left common iliac artery stent appears to be patent. There appears to be flow in the left external iliac artery. Limited evaluation of the right common iliac artery. There is flow in the right external iliac artery. Surgical clips of the left groin. Flow in the proximal bilateral femoral arteries but limited evaluation. Veins: No obvious venous abnormality within the limitations of this arterial phase study. Review of the MIP images confirms the above findings. NON-VASCULAR Hepatobiliary: Distention of the gallbladder. No gross abnormality to the liver but limited evaluation due to streak artifact. Pancreas: Unremarkable.  No pancreatic ductal dilatation or surrounding inflammatory changes. Spleen: Normal in size without  focal abnormality. Adrenals/Urinary Tract: Normal appearance of the right adrenal gland. There is stable fullness and nodularity in the left adrenal gland measuring up to 1.7 cm and compatible with an adenoma based on exam from 08/17/2015. There is new right perinephric edema. There is a new gas in the bilateral renal collecting systems. Decreased enhancement scattered throughout both kidneys, particularly the right kidney. Findings are suggestive for pyelonephritis. Focal pyelonephritis in the left kidney upper pole. There are several small bilateral renal calculi. There is extensive cortical thinning and scarring in the left kidney. There is now a small stone in the proximal right ureter measuring roughly 6 mm. There is new mild right hydronephrosis. One or two small stones in the right renal pelvis. Minimal dilatation of left renal pelvis. Stomach/Bowel: No acute inflammation involving the bowel structures. Stomach is unremarkable. Lymphatic: No significant lymph node enlargement. Reproductive: Stable appearance of the uterus and adnexal structures. Right adnexa is mildly prominent but poorly characterized on this examination. Other: Trace fluid in the pelvis.  Negative for free air. Musculoskeletal: No acute abnormality. Review of the MIP images confirms the above findings. IMPRESSION: 1. Bilateral pyelonephritis with emphysematous pyelitis. Gas within the bilateral renal collecting systems and urinary bladder. 2. Bilateral renal calculi with 6 mm stone in the proximal right ureter. Mild right hydronephrosis. 3. Negative for an aortic dissection. Limited evaluation of the abdominopelvic vascular structures. The left common iliac artery stent appears to be patent. 4. Extensive scarring in the left kidney. These results were called by telephone at the time of interpretation on 06/26/2018 at 7:26 pm to Dr. Tomi Bamberger , who verbally acknowledged these results. Electronically Signed   By: Markus Daft M.D.   On: 06/26/2018  19:26     LOS: 2 days   Oren Binet, MD  [facility]  If 7PM-7AM, please contact night-coverage  Please page via [email or web address] [email or web address]-click on MD name and type text message  06/28/2018, 1:18 PM

## 2018-06-28 NOTE — Progress Notes (Signed)
ANTICOAGULATION CONSULT NOTE - Follow Up Consult  Pharmacy Consult for Heparin Indication: hx of thrombus (Coumadin on hold)  Allergies  Allergen Reactions  . Ciprofloxacin Hcl Hives  . Macrobid [Nitrofurantoin Macrocrystal] Hives, Shortness Of Breath and Rash  . Other Hives, Shortness Of Breath and Rash    NO "-CILLINS"!!!  . Penicillins Shortness Of Breath    Had had cephalosporins without incident Has patient had a PCN reaction causing immediate rash, facial/tongue/throat swelling, SOB or lightheadedness with hypotension: Yes Has patient had a PCN reaction causing severe rash involving mucus membranes or skin necrosis: Unk Has patient had a PCN reaction that required hospitalization: Unk Has patient had a PCN reaction occurring within the last 10 years: No If all of the above answers are "NO", then may proceed with Cephalosporin use.   . Sulfa Antibiotics Hives, Shortness Of Breath and Rash  . Dilaudid [Hydromorphone Hcl] Other (See Comments)    Asystole per pt report  . Lexapro [Escitalopram Oxalate] Other (See Comments)    "I just did not like it."    Patient Measurements: Height: 5\' 7"  (170.2 cm) Weight: 275 lb 9.2 oz (125 kg) IBW/kg (Calculated) : 61.6 Heparin Dosing Weight: 91.4 kg  Vital Signs: Temp: 98.2 F (36.8 C) (09/25 1403) Temp Source: Oral (09/25 1403) BP: 110/69 (09/25 1403) Pulse Rate: 97 (09/25 1403)  Labs: Recent Labs    06/26/18 1305  06/26/18 1629 06/27/18 0126 06/27/18 0829 06/27/18 1225 06/28/18 0451 06/28/18 1952  HGB 11.9*  --  10.9* 9.1*  --   --  8.3*  --   HCT 38.5  --  32.0* 28.7*  --   --  27.2*  --   PLT 351  --   --  269  --   --  236  --   LABPROT  --    < >  --  40.3* 29.6* 24.1* 19.5*  --   INR  --    < >  --  4.22* 2.84 2.18 1.66  --   HEPARINUNFRC  --   --   --   --   --   --   --  <0.10*  CREATININE 5.87*  --  6.30* 5.73*  --   --  5.31*  --    < > = values in this interval not displayed.    Estimated Creatinine  Clearance: 18.8 mL/min (A) (by C-G formula based on SCr of 5.31 mg/dL (H)).   Medications:  Scheduled:  . sodium chloride   Intravenous Once  . buPROPion  150 mg Oral BID  . gabapentin  200 mg Oral BID  . insulin aspart  0-9 Units Subcutaneous Q4H  . insulin glargine  30 Units Subcutaneous QHS  . megestrol  80 mg Oral Daily  . nystatin   Topical QID  . pantoprazole  40 mg Oral Daily  . senna  2 tablet Oral Daily  . sodium chloride flush  3 mL Intravenous Q12H   Infusions:  . cefTRIAXone (ROCEPHIN)  IV 2 g (06/28/18 1510)  . heparin 1,400 Units/hr (06/28/18 1405)  .  sodium bicarbonate  infusion 1000 mL 125 mL/hr at 06/28/18 1413  . sodium chloride      Assessment: 42 yo F on Coumadin PTA for hx iliac thrombus presented to ED with flank pain and hematuria.  Pt was found to have INR 4.8 and R ureteral stone.  Pt has been seen by urology who placed a ureteral stent.  INR was reversed with Vit  K for this procedure.  Pt was started on heparin given subtherapeutic INR.  Heparin level <0.1 on 1400 units/hr.  No IV issues noted per RN.  Goal of Therapy:  Heparin level 0.3-0.7 units/ml Monitor platelets by anticoagulation protocol: Yes   Plan:  Increase heparin to 1700 units/hr. Next heparin level in 8 hours. Daily heparin level and CBC while on heparin. Follow-up plans for any further procedures and resumption of Coumadin  [employer], Pharm.D., BCPS Clinical Pharmacist Pager: [phone] Clinical phone for 06/28/2018  is [phone].  **Pharmacist phone directory can now be found on [email or web address] (PW [email or web address]).  Listed under [place].  06/28/2018 9:14 PM

## 2018-06-29 DIAGNOSIS — N12 Tubulo-interstitial nephritis, not specified as acute or chronic: Secondary | ICD-10-CM

## 2018-06-29 LAB — CULTURE, BLOOD (ROUTINE X 2): Special Requests: ADEQUATE

## 2018-06-29 LAB — GLUCOSE, CAPILLARY
Glucose-Capillary: 127 mg/dL — ABNORMAL HIGH (ref 70–99)
Glucose-Capillary: 154 mg/dL — ABNORMAL HIGH (ref 70–99)
Glucose-Capillary: 158 mg/dL — ABNORMAL HIGH (ref 70–99)
Glucose-Capillary: 199 mg/dL — ABNORMAL HIGH (ref 70–99)
Glucose-Capillary: 214 mg/dL — ABNORMAL HIGH (ref 70–99)
Glucose-Capillary: 90 mg/dL (ref 70–99)

## 2018-06-29 LAB — HEPARIN LEVEL (UNFRACTIONATED)
Heparin Unfractionated: <0.1 IU/mL — ABNORMAL LOW (ref 0.30–0.70)
Heparin Unfractionated: <0.1 IU/mL — ABNORMAL LOW (ref 0.30–0.70)

## 2018-06-29 LAB — BASIC METABOLIC PANEL
Anion gap: 10 (ref 5–15)
BUN: 55 mg/dL — ABNORMAL HIGH (ref 6–20)
CO2: 19 mmol/L — ABNORMAL LOW (ref 22–32)
Calcium: 8.2 mg/dL — ABNORMAL LOW (ref 8.9–10.3)
Chloride: 100 mmol/L (ref 98–111)
Creatinine, Ser: 4.94 mg/dL — ABNORMAL HIGH (ref 0.44–1.00)
GFR calc Af Amer: 11 mL/min — ABNORMAL LOW (ref >60)
GFR calc non Af Amer: 10 mL/min — ABNORMAL LOW (ref >60)
Glucose, Bld: 187 mg/dL — ABNORMAL HIGH (ref 70–99)
Potassium: 3.9 mmol/L (ref 3.5–5.1)
Sodium: 129 mmol/L — ABNORMAL LOW (ref 135–145)

## 2018-06-29 LAB — PROTIME-INR
INR: 1.61
Prothrombin Time: 19 seconds — ABNORMAL HIGH (ref 11.4–15.2)

## 2018-06-29 LAB — CBC
HCT: 24.8 % — ABNORMAL LOW (ref 36.0–46.0)
Hemoglobin: 8 g/dL — ABNORMAL LOW (ref 12.0–15.0)
MCH: 28.7 pg (ref 26.0–34.0)
MCHC: 32.3 g/dL (ref 30.0–36.0)
MCV: 88.9 fL (ref 78.0–100.0)
Platelets: 225 10*3/uL (ref 150–400)
RBC: 2.79 MIL/uL — ABNORMAL LOW (ref 3.87–5.11)
RDW: 17.4 % — ABNORMAL HIGH (ref 11.5–15.5)
WBC: 11.5 10*3/uL — ABNORMAL HIGH (ref 4.0–10.5)

## 2018-06-29 MED ORDER — ALPRAZOLAM 0.25 MG PO TABS: 0.2500 mg | ORAL_TABLET | Freq: Four times a day (QID) | ORAL | Status: DC

## 2018-06-29 MED ORDER — WARFARIN - PHARMACIST DOSING INPATIENT
Freq: Every day | Status: DC
  Administered 2018-06-29 – 2018-06-30 (×2)

## 2018-06-29 MED ORDER — ENOXAPARIN SODIUM 120 MG/0.8ML ~~LOC~~ SOLN
120.0000 mg | SUBCUTANEOUS | Status: DC
  Administered 2018-06-29 – 2018-06-30 (×2): 120 mg via SUBCUTANEOUS
  Filled 2018-06-29 (×4): qty 0.8

## 2018-06-29 MED ORDER — SODIUM BICARBONATE 8.4 % IV SOLN
INTRAVENOUS | Status: DC
  Administered 2018-06-29 – 2018-06-30 (×2): via INTRAVENOUS
  Filled 2018-06-29 (×3): qty 75

## 2018-06-29 MED ORDER — WARFARIN SODIUM 5 MG PO TABS
10.0000 mg | ORAL_TABLET | Freq: Once | ORAL | Status: AC
  Administered 2018-06-29: 10 mg via ORAL
  Filled 2018-06-29: qty 2

## 2018-06-29 MED ORDER — MAGIC MOUTHWASH W/LIDOCAINE
15.0000 mL | Freq: Four times a day (QID) | ORAL | Status: DC | PRN
  Administered 2018-06-30 – 2018-07-04 (×10): 15 mL via ORAL
  Filled 2018-06-29 (×12): qty 15

## 2018-06-29 MED ORDER — ALPRAZOLAM 0.25 MG PO TABS
0.2500 mg | ORAL_TABLET | Freq: Four times a day (QID) | ORAL | Status: DC | PRN
  Administered 2018-06-29 – 2018-07-05 (×13): 0.25 mg via ORAL
  Filled 2018-06-29 (×13): qty 1

## 2018-06-29 NOTE — Progress Notes (Addendum)
On reassessment pt verbalized improvement in previously reported decreased sensation in LE.  Per pt, sensations in LE is back to normal expect for right hip where sensation is still slightly decreased. Will continue to monitor pt.

## 2018-06-29 NOTE — Progress Notes (Signed)
[place] for Warfarin>>>Heparin Indication: thrombus  Allergies  Allergen Reactions  . Ciprofloxacin Hcl Hives  . Macrobid [Nitrofurantoin Macrocrystal] Hives, Shortness Of Breath and Rash  . Other Hives, Shortness Of Breath and Rash    NO "-CILLINS"!!!  . Penicillins Shortness Of Breath    Had had cephalosporins without incident Has patient had a PCN reaction causing immediate rash, facial/tongue/throat swelling, SOB or lightheadedness with hypotension: Yes Has patient had a PCN reaction causing severe rash involving mucus membranes or skin necrosis: Unk Has patient had a PCN reaction that required hospitalization: Unk Has patient had a PCN reaction occurring within the last 10 years: No If all of the above answers are "NO", then may proceed with Cephalosporin use.   . Sulfa Antibiotics Hives, Shortness Of Breath and Rash  . Dilaudid [Hydromorphone Hcl] Other (See Comments)    Asystole per pt report  . Lexapro [Escitalopram Oxalate] Other (See Comments)    "I just did not like it."    Patient Measurements: Height: 5\' 7"  (170.2 cm) Weight: 275 lb 9.2 oz (125 kg) IBW/kg (Calculated) : 61.6  Vital Signs: BP: 128/77 (09/25 2317) Pulse Rate: 108 (09/25 2317)  Labs: Recent Labs    06/27/18 0126  06/27/18 1225 06/28/18 0451 06/28/18 1952 06/29/18 0455  HGB 9.1*  --   --  8.3*  --  8.0*  HCT 28.7*  --   --  27.2*  --  24.8*  PLT 269  --   --  236  --  225  LABPROT 40.3*   < > 24.1* 19.5*  --  19.0*  INR 4.22*   < > 2.18 1.66  --  1.61  HEPARINUNFRC  --   --   --   --  <0.10* <0.10*  CREATININE 5.73*  --   --  5.31*  --  4.94*   < > = values in this interval not displayed.    Estimated Creatinine Clearance: 20.2 mL/min (A) (by C-G formula based on SCr of 4.94 mg/dL (H)).   Medical History: Past Medical History:  Diagnosis Date  . Anxiety   . Diabetes ([place])   . GERD (gastroesophageal reflux disease)   . History of blood clots    . Hypercalcemia   . Hyperparathyroidism   . Nephrolithiasis   . Renal calculi     Assessment: 42 year old female to begin heparin while warfarin on hold for iliac thrombus INR today = 1.61 Heparin level undetectable despite rate increase No issues per RN  Goal of Therapy:  Heparin level 0.3-0.7 units/ml Monitor platelets by anticoagulation protocol: Yes   Plan:  Inc heparin drip to 2100 units/hr 1400 HL  Narda Bonds, PharmD, BCPS Clinical Pharmacist Phone: [phone]

## 2018-06-29 NOTE — Progress Notes (Addendum)
[place] for Warfarin>>> Lovenox + Warfarin Indication: thrombus  Allergies  Allergen Reactions  . Ciprofloxacin Hcl Hives  . Macrobid [Nitrofurantoin Macrocrystal] Hives, Shortness Of Breath and Rash  . Other Hives, Shortness Of Breath and Rash    NO "-CILLINS"!!!  . Penicillins Shortness Of Breath    Had had cephalosporins without incident Has patient had a PCN reaction causing immediate rash, facial/tongue/throat swelling, SOB or lightheadedness with hypotension: Yes Has patient had a PCN reaction causing severe rash involving mucus membranes or skin necrosis: Unk Has patient had a PCN reaction that required hospitalization: Unk Has patient had a PCN reaction occurring within the last 10 years: No If all of the above answers are "NO", then may proceed with Cephalosporin use.   . Sulfa Antibiotics Hives, Shortness Of Breath and Rash  . Dilaudid [Hydromorphone Hcl] Other (See Comments)    Asystole per pt report  . Lexapro [Escitalopram Oxalate] Other (See Comments)    "I just did not like it."    Patient Measurements: Height: 5\' 7"  (170.2 cm) Weight: 275 lb 9.2 oz (125 kg) IBW/kg (Calculated) : 61.6  Vital Signs: Temp: 98.2 F (36.8 C) (09/26 0649) Temp Source: Oral (09/26 0649) BP: 117/73 (09/26 0649) Pulse Rate: 88 (09/26 0649)  Labs: Recent Labs    06/27/18 0126  06/27/18 1225 06/28/18 0451 06/28/18 1952 06/29/18 0455  HGB 9.1*  --   --  8.3*  --  8.0*  HCT 28.7*  --   --  27.2*  --  24.8*  PLT 269  --   --  236  --  225  LABPROT 40.3*   < > 24.1* 19.5*  --  19.0*  INR 4.22*   < > 2.18 1.66  --  1.61  HEPARINUNFRC  --   --   --   --  <0.10* <0.10*  CREATININE 5.73*  --   --  5.31*  --  4.94*   < > = values in this interval not displayed.    Estimated Creatinine Clearance: 20.2 mL/min (A) (by C-G formula based on SCr of 4.94 mg/dL (H)).     Assessment: 42 year old female to begin heparin while warfarin on hold for  iliac thrombus INR today = 1.61 Heparin level undetectable still despite rate increases - > will transition to Lovenox Restarting Warfarin 9/26  Goal of Therapy:  Heparin level 0.3-0.7 units/ml Monitor platelets by anticoagulation protocol: Yes  Daily INR checks   Plan:  DC heparin -> start Lovenox 120 mg sq Q 24 hours One dose of Warfarin 10 mg on 9/26 Check daily INR, Watch CBC   Lucina Mellow, PharmD Student Anette Guarneri, PharmD Phone: [phone]

## 2018-06-29 NOTE — Progress Notes (Signed)
PROGRESS NOTE        PATIENT DETAILS Name: Terri Sparks Age: 42 y.o. Sex: female Date of Birth: 1976/04/01 Admit Date: 06/26/2018 Admitting Physician Vianne Bulls, MD VHQ:[id number], Dalbert Batman, MD  Brief Narrative: Patient is a 42 y.o. 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 coli bacteremia.  CT imaging also showed a 6 mm proximal stone in the right ureter with mild right hydronephrosis.  Urology and IR both were consulted, she subsequently underwent cystoscopy with right stent placement on 9/24.  1 out of 2 blood cultures were subsequently positive for gram-negative rods.  Slowly improving with supportive care-see below for further details.  Subjective: Feels much better-continues to have intermittent flank pain-mostly in the right.  Complains of some leg swelling/edema.  She did have some tingling and numbness to her lower extremities yesterday that has resolved spontaneously.  Assessment/Plan: Sepsis secondary to bilateral pyelonephritis and E. coli bacteremia: Sepsis pathophysiology has markedly improved, 1/2 blood culture positive for pansensitive E. coli-continue with IV Rocephin for a few more days, as clinical improvement continues we will switch to oral route.  We will plan on at least 2 weeks of treatment with antimicrobial therapy.   Right ureteral stone with minimal right-sided hydronephrosis: Underwent cystoscopy with right JJ stent placement on 9/24-renal function improving.  Urology consulted and following.   Acute kidney injury on CKD stage III: AKI multifactorial-hemodynamically mediated in the setting of sepsis and also with some  contributions from obstructive uropathy.  Improving-creatinine downtrending with supportive care-but still way off her usual baseline.  Due to mild volume overload-decrease IV fluids to 75 cc an hour.  She has some mild edema in her lower extremities but the lungs are clear-once renal function stabilizes further we can consider IV Lasix.  Continue to avoid nephrotoxic agents.  Non-anion gap metabolic acidosis: Likely secondary to AKI-continue with IVF with bicarb.  Bicarb levels improving-follow.  Hyponatremia: Stable for now-appears mild-monitor-continue cautious hydration and follow.  Hyperkalemia: Resolved-follow  Hypertension: Controlled-continue to hold all antihypertensives.  Insulin-dependent DM-2: CBG stable-continue Lantus 30 units nightly and SSI.  Follow and adjust accordingly.  CBG stable-continue Lantus 30 units nightly and SSI.   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    DVT Prophylaxis: IV Heparin with overlapping Coumadin  Code Status: Full code   Family Communication: None at bedside  Disposition Plan: Remain inpatient-will require several days of hospitalization before consideration of discharge.  Antimicrobial agents: Anti-infectives (From admission, onward)   Start     Dose/Rate Route Frequency Ordered Stop   06/28/18 1900  vancomycin (VANCOCIN) 1,750 mg in sodium chloride 0.9 % 500 mL IVPB  Status:  Discontinued     1,750 mg 250 mL/hr over 120 Minutes Intravenous Every 48 hours 06/26/18 2022 06/27/18 1319   06/27/18 1800  ceFEPIme (MAXIPIME) 500 mg in dextrose 5 % 50 mL IVPB  Status:  Discontinued     500 mg 100 mL/hr over 30 Minutes Intravenous Every 24 hours  06/26/18 2022 06/27/18 0852   06/27/18 1800  ceFEPIme (MAXIPIME) 1 g in sodium chloride 0.9 % 100 mL IVPB  Status:  Discontinued      1 g 200 mL/hr over 30 Minutes Intravenous Every 24 hours 06/27/18 0852 06/27/18 1319   06/27/18 1500  cefTRIAXone (ROCEPHIN) 2 g in sodium chloride 0.9 % 100 mL IVPB     2 g 200 mL/hr over 30 Minutes Intravenous Every 24 hours 06/27/18 1319     06/26/18 1700  vancomycin (VANCOCIN) 2,000 mg in sodium chloride 0.9 % 500 mL IVPB     2,000 mg 250 mL/hr over 120 Minutes Intravenous  Once 06/26/18 1626 06/26/18 2141   06/26/18 1700  ceFEPIme (MAXIPIME) 2 g in sodium chloride 0.9 % 100 mL IVPB     2 g 200 mL/hr over 30 Minutes Intravenous  Once 06/26/18 1654 06/26/18 1838   06/26/18 1600  vancomycin (VANCOCIN) IVPB 1000 mg/200 mL premix  Status:  Discontinued     1,000 mg 200 mL/hr over 60 Minutes Intravenous  Once 06/26/18 1559 06/26/18 1626      Procedures: 9/24>>Cystoscopy with right JJ stent placement  CONSULTS:  urology  Time spent: 25 minutes-Greater than 50% of this time was spent in counseling, explanation of diagnosis, planning of further management, and coordination of care.  MEDICATIONS: Scheduled Meds: . sodium chloride   Intravenous Once  . buPROPion  150 mg Oral BID  . gabapentin  200 mg Oral BID  . insulin aspart  0-9 Units Subcutaneous Q4H  . insulin glargine  30 Units Subcutaneous QHS  . megestrol  80 mg Oral Daily  . nystatin   Topical QID  . pantoprazole  40 mg Oral Daily  . senna  2 tablet Oral Daily  . sodium chloride flush  3 mL Intravenous Q12H   Continuous Infusions: . cefTRIAXone (ROCEPHIN)  IV 2 g (06/28/18 1510)  . heparin 2,100 Units/hr (06/29/18 0646)  .  sodium bicarbonate  infusion 1000 mL    . sodium chloride     PRN Meds:.acetaminophen **OR** [DISCONTINUED] acetaminophen, albuterol, diphenhydrAMINE, hydrOXYzine, morphine injection, [DISCONTINUED] ondansetron **OR** ondansetron (ZOFRAN) IV, oxyCODONE-acetaminophen, sodium chloride   PHYSICAL EXAM: Vital signs: Vitals:   06/28/18 1114 06/28/18 1403 06/28/18 2317 06/29/18 0649  BP: (!)  104/57 110/69 128/77 117/73  Pulse: 95 97 (!) 108 88  Resp: 17 18  16   Temp: 97.7 F (36.5 C) 98.2 F (36.8 C)  98.2 F (36.8 C)  TempSrc: Oral Oral  Oral  SpO2: 97% 100% 100% 96%  Weight:      Height:       Filed Weights   06/27/18 0200  Weight: 125 kg   Body mass index is 43.16 kg/m.   General appearance:Awake, alert, not in any distress.  Eyes:no scleral icterus. HEENT: Atraumatic and Normocephalic Neck: supple, no JVD. Resp:Good air entry bilaterally,no rales or rhonchi CVS: S1 S2 regular, no murmurs.  GI: Bowel sounds present, Non tender and not distended with no gaurding, rigidity or rebound. Extremities: B/L Lower Ext shows no edema, both legs are warm to touch Neurology:  Non focal Psychiatric: Normal judgment and insight. Normal mood. Musculoskeletal:No digital cyanosis Skin:No Rash, warm and dry Wounds:N/A  I have personally reviewed following labs and imaging studies  LABORATORY DATA: CBC: Recent Labs  Lab 06/26/18 1305 06/26/18 1629 06/27/18 0126 06/28/18 0451 06/29/18 0455  WBC 21.7*  --  15.9* 13.4* 11.5*  NEUTROABS  --   --  13.9*  --   --  HGB 11.9* 10.9* 9.1* 8.3* 8.0*  HCT 38.5 32.0* 28.7* 27.2* 24.8*  MCV 91.9  --  90.5 93.5 88.9  PLT 351  --  269 236 409    Basic Metabolic Panel: Recent Labs  Lab 06/26/18 1305 06/26/18 1629 06/27/18 0126 06/28/18 0451 06/29/18 0455  NA 129* 132* 135 129* 129*  K 5.3* 4.8 4.4 4.5 3.9  CL 95* 101 105 102 100  CO2 15*  --  16* 14* 19*  GLUCOSE 366* 343* 197* 168* 187*  BUN 48* 50* 52* 59* 55*  CREATININE 5.87* 6.30* 5.73* 5.31* 4.94*  CALCIUM 10.0  --  8.3* 8.0* 8.2*  MG  --   --   --  1.5*  --     GFR: Estimated Creatinine Clearance: 20.2 mL/min (A) (by C-G formula based on SCr of 4.94 mg/dL (H)).  Liver Function Tests: Recent Labs  Lab 06/26/18 1305 06/27/18 0126  AST 19 15  ALT 14 12  ALKPHOS 97 74  BILITOT 0.6 0.6  PROT 7.2 5.7*  ALBUMIN 2.8* 2.2*   Recent Labs  Lab  06/26/18 1305  LIPASE 15   No results for input(s): AMMONIA in the last 168 hours.  Coagulation Profile: Recent Labs  Lab 06/27/18 0126 06/27/18 0829 06/27/18 1225 06/28/18 0451 06/29/18 0455  INR 4.22* 2.84 2.18 1.66 1.61    Cardiac Enzymes: No results for input(s): CKTOTAL, CKMB, CKMBINDEX, TROPONINI in the last 168 hours.  BNP (last 3 results) No results for input(s): PROBNP in the last 8760 hours.  HbA1C: No results for input(s): HGBA1C in the last 72 hours.  CBG: Recent Labs  Lab 06/28/18 1642 06/28/18 2021 06/29/18 0036 06/29/18 0334 06/29/18 0815  GLUCAP 167* 160* 199* 214* 158*    Lipid Profile: Recent Labs    06/27/18 0126  CHOL 84  HDL 13*  LDLCALC 24  TRIG 233*  CHOLHDL 6.5    Thyroid Function Tests: No results for input(s): TSH, T4TOTAL, FREET4, T3FREE, THYROIDAB in the last 72 hours.  Anemia Panel: No results for input(s): VITAMINB12, FOLATE, FERRITIN, TIBC, IRON, RETICCTPCT in the last 72 hours.  Urine analysis:    Component Value Date/Time   COLORURINE RED (A) 06/26/2018 1317   APPEARANCEUR TURBID (A) 06/26/2018 1317   LABSPEC  06/26/2018 1317    TEST NOT REPORTED DUE TO COLOR INTERFERENCE OF URINE PIGMENT   PHURINE  06/26/2018 1317    TEST NOT REPORTED DUE TO COLOR INTERFERENCE OF URINE PIGMENT   GLUCOSEU (A) 06/26/2018 1317    TEST NOT REPORTED DUE TO COLOR INTERFERENCE OF URINE PIGMENT   HGBUR (A) 06/26/2018 1317    TEST NOT REPORTED DUE TO COLOR INTERFERENCE OF URINE PIGMENT   BILIRUBINUR (A) 06/26/2018 1317    TEST NOT REPORTED DUE TO COLOR INTERFERENCE OF URINE PIGMENT   BILIRUBINUR negative 09/29/2017 1317   KETONESUR (A) 06/26/2018 1317    TEST NOT REPORTED DUE TO COLOR INTERFERENCE OF URINE PIGMENT   PROTEINUR (A) 06/26/2018 1317    TEST NOT REPORTED DUE TO COLOR INTERFERENCE OF URINE PIGMENT   UROBILINOGEN 0.2 06/15/2018 1530   NITRITE (A) 06/26/2018 1317    TEST NOT REPORTED DUE TO COLOR INTERFERENCE OF URINE  PIGMENT   LEUKOCYTESUR (A) 06/26/2018 1317    TEST NOT REPORTED DUE TO COLOR INTERFERENCE OF URINE PIGMENT    Sepsis Labs: Lactic Acid, Venous    Component Value Date/Time   LATICACIDVEN 2.5 (Stella) 06/27/2018 0126    MICROBIOLOGY: Recent Results (from the past  240 hour(s))  Blood Culture (routine x 2)     Status: Abnormal   Collection Time: 06/26/18  4:21 PM  Result Value Ref Range Status   Specimen Description BLOOD SITE NOT SPECIFIED  Final   Special Requests   Final    BOTTLES DRAWN AEROBIC AND ANAEROBIC Blood Culture adequate volume   Culture  Setup Time   Final    GRAM NEGATIVE RODS IN BOTH AEROBIC AND ANAEROBIC BOTTLES CRITICAL RESULT CALLED TO, READ BACK BY AND VERIFIED WITH: Hughie Closs PharmD 13:20 06/27/18 ([email or web address]) Performed at [facility], [place] [address] [place], [place] [address]    Culture ESCHERICHIA COLI (A)  Final   Report Status 06/29/2018 FINAL  Final   Organism ID, Bacteria ESCHERICHIA COLI  Final      Susceptibility   Escherichia coli - MIC*    AMPICILLIN 8 SENSITIVE Sensitive     CEFAZOLIN <=4 SENSITIVE Sensitive     CEFEPIME <=1 SENSITIVE Sensitive     CEFTAZIDIME <=1 SENSITIVE Sensitive     CEFTRIAXONE <=1 SENSITIVE Sensitive     CIPROFLOXACIN <=0.25 SENSITIVE Sensitive     GENTAMICIN <=1 SENSITIVE Sensitive     IMIPENEM <=0.25 SENSITIVE Sensitive     TRIMETH/SULFA <=20 SENSITIVE Sensitive     AMPICILLIN/SULBACTAM 4 SENSITIVE Sensitive     PIP/TAZO <=4 SENSITIVE Sensitive     Extended ESBL NEGATIVE Sensitive     * ESCHERICHIA COLI  Blood Culture ID Panel (Reflexed)     Status: Abnormal   Collection Time: 06/26/18  4:21 PM  Result Value Ref Range Status   Enterococcus species NOT DETECTED NOT DETECTED Final   Listeria monocytogenes NOT DETECTED NOT DETECTED Final   Staphylococcus species NOT DETECTED NOT DETECTED Final   Staphylococcus aureus NOT DETECTED NOT DETECTED Final   Streptococcus species NOT DETECTED NOT DETECTED Final    Streptococcus agalactiae NOT DETECTED NOT DETECTED Final   Streptococcus pneumoniae NOT DETECTED NOT DETECTED Final   Streptococcus pyogenes NOT DETECTED NOT DETECTED Final   Acinetobacter baumannii NOT DETECTED NOT DETECTED Final   Enterobacteriaceae species DETECTED (A) NOT DETECTED Final    Comment: Enterobacteriaceae represent a large family of gram-negative bacteria, not a single organism. CRITICAL RESULT CALLED TO, READ BACK BY AND VERIFIED WITH: Hughie Closs PharmD 13:20 06/27/18 ([email or web address])    Enterobacter cloacae complex NOT DETECTED NOT DETECTED Final   Escherichia coli DETECTED (A) NOT DETECTED Final    Comment: CRITICAL RESULT CALLED TO, READ BACK BY AND VERIFIED WITH: Hughie Closs PharmD 13:20 06/27/18 ([email or web address])    Klebsiella oxytoca NOT DETECTED NOT DETECTED Final   Klebsiella pneumoniae NOT DETECTED NOT DETECTED Final   Proteus species NOT DETECTED NOT DETECTED Final   Serratia marcescens NOT DETECTED NOT DETECTED Final   Carbapenem resistance NOT DETECTED NOT DETECTED Final   Haemophilus influenzae NOT DETECTED NOT DETECTED Final   Neisseria meningitidis NOT DETECTED NOT DETECTED Final   Pseudomonas aeruginosa NOT DETECTED NOT DETECTED Final   Candida albicans NOT DETECTED NOT DETECTED Final   Candida glabrata NOT DETECTED NOT DETECTED Final   Candida krusei NOT DETECTED NOT DETECTED Final   Candida parapsilosis NOT DETECTED NOT DETECTED Final   Candida tropicalis NOT DETECTED NOT DETECTED Final  Blood Culture (routine x 2)     Status: None (Preliminary result)   Collection Time: 06/26/18  5:52 PM  Result Value Ref Range Status   Specimen Description BLOOD LEFT ARM  Final   Special Requests   Final  BOTTLES DRAWN AEROBIC AND ANAEROBIC Blood Culture adequate volume   Culture   Final    NO GROWTH 3 DAYS Performed at [facility], [place] [address] [place], [place] [address]    Report Status PENDING  Incomplete  MRSA PCR Screening     Status: None   Collection Time:  06/27/18  9:25 AM  Result Value Ref Range Status   MRSA by PCR NEGATIVE NEGATIVE Final    Comment:        The GeneXpert MRSA Assay (FDA approved for NASAL specimens only), is one component of a comprehensive MRSA colonization surveillance program. It is not intended to diagnose MRSA infection nor to guide or monitor treatment for MRSA infections. Performed at [facility], [place] [address] [place], [place] [address]   Surgical PCR screen     Status: None   Collection Time: 06/27/18  3:34 PM  Result Value Ref Range Status   MRSA, PCR NEGATIVE NEGATIVE Final   Staphylococcus aureus NEGATIVE NEGATIVE Final    Comment: (NOTE) The Xpert SA Assay (FDA approved for NASAL specimens in patients 34 years of age and older), is one component of a comprehensive surveillance program. It is not intended to diagnose infection nor to guide or monitor treatment. Performed at [facility], [place] [address] [place], [place] [address]     RADIOLOGY STUDIES/RESULTS: Dg Chest 2 View  Result Date: 06/26/2018 CLINICAL DATA:  shortness of breath, n/v and rt flank pain for two days, weak,hx hematuria EXAM: CHEST - 2 VIEW COMPARISON:  None. FINDINGS: The heart size and mediastinal contours are within normal limits. Both lungs are clear. The visualized skeletal structures are unremarkable. IMPRESSION: No active cardiopulmonary disease. Electronically Signed   By: Nolon Nations M.D.   On: 06/26/2018 13:38   Dg Retrograde Pyelogram  Result Date: 06/28/2018 CLINICAL DATA:  Right-sided ureteral stent placement EXAM: RETROGRADE PYELOGRAM COMPARISON:  CT abdomen pelvis-05/12/2018 FLUOROSCOPY TIME:  19 seconds FINDINGS: A single spot fluoroscopic image of the right mid/lower abdomen is provided for review Provided image demonstrates the superior aspect of a right-sided ureteral stent with superior coil overlying expected location the right renal fossa. The inferior aspect of the stent was not imaged.  IMPRESSION: Post right-sided ureteral stent placement. Correlation with the operative report is advised. Electronically Signed   By: Sandi Mariscal M.D.   On: 06/28/2018 07:39   Ct Angio Chest/abd/pel For Dissection W And/or Wo Contrast  Result Date: 06/26/2018 CLINICAL DATA:  42 year old with shortness of breath. Evaluate for aortic dissection. EXAM: CT ANGIOGRAPHY CHEST, ABDOMEN AND PELVIS TECHNIQUE: Multidetector CT imaging through the chest, abdomen and pelvis was performed using the standard protocol during bolus administration of intravenous contrast. Multiplanar reconstructed images and MIPs were obtained and reviewed to evaluate the vascular anatomy. CONTRAST:  136mL ISOVUE-370 IOPAMIDOL (ISOVUE-370) INJECTION 76% COMPARISON:  05/12/2018 and chest CT 08/26/2014 FINDINGS: CTA CHEST FINDINGS Cardiovascular: Normal caliber of the thoracic aorta without dissection or intramural hematoma. Heart size is normal without significant pericardial fluid. Pulmonary arteries are not opacified with contrast. Typical aortic arch anatomy. Visualized great vessels are patent. Mediastinum/Nodes: Mediastinal structures are unremarkable. No evidence for mediastinal, hilar or axillary lymphadenopathy. Lungs/Pleura: Trachea and mainstem bronchi are patent. No large pleural effusions. Atelectasis in the right lower lobe. There are 2 stable nodular densities along the right major fissure since 2015. The largest measures 7 mm on sequence 11, image 62. These are compatible with benign findings based on the stability.  Stable pleural-based nodularity along the anterior right lung on sequence 11, image 75. Nodular density along the left side of the heart on sequence 11, image 90 is probably unchanged since 2015. No large areas of airspace disease or lung consolidation. Musculoskeletal: No acute bone abnormality. Review of the MIP images confirms the above findings. CTA ABDOMEN AND PELVIS FINDINGS VASCULAR Aorta: Abdominal aorta is  patent without dissection. Limited evaluation of the aorta due to poor contrast opacification and body habitus. Celiac: Celiac trunk and main branch vessels are patent. SMA: SMA is patent but limited evaluation. Renals: Flow in bilateral renal arteries but limited evaluation. IMA: Limited evaluation. Inflow: Again noted is a stent left common iliac artery. Left common iliac artery stent appears to be patent. There appears to be flow in the left external iliac artery. Limited evaluation of the right common iliac artery. There is flow in the right external iliac artery. Surgical clips of the left groin. Flow in the proximal bilateral femoral arteries but limited evaluation. Veins: No obvious venous abnormality within the limitations of this arterial phase study. Review of the MIP images confirms the above findings. NON-VASCULAR Hepatobiliary: Distention of the gallbladder. No gross abnormality to the liver but limited evaluation due to streak artifact. Pancreas: Unremarkable. No pancreatic ductal dilatation or surrounding inflammatory changes. Spleen: Normal in size without focal abnormality. Adrenals/Urinary Tract: Normal appearance of the right adrenal gland. There is stable fullness and nodularity in the left adrenal gland measuring up to 1.7 cm and compatible with an adenoma based on exam from 08/17/2015. There is new right perinephric edema. There is a new gas in the bilateral renal collecting systems. Decreased enhancement scattered throughout both kidneys, particularly the right kidney. Findings are suggestive for pyelonephritis. Focal pyelonephritis in the left kidney upper pole. There are several small bilateral renal calculi. There is extensive cortical thinning and scarring in the left kidney. There is now a small stone in the proximal right ureter measuring roughly 6 mm. There is new mild right hydronephrosis. One or two small stones in the right renal pelvis. Minimal dilatation of left renal pelvis.  Stomach/Bowel: No acute inflammation involving the bowel structures. Stomach is unremarkable. Lymphatic: No significant lymph node enlargement. Reproductive: Stable appearance of the uterus and adnexal structures. Right adnexa is mildly prominent but poorly characterized on this examination. Other: Trace fluid in the pelvis.  Negative for free air. Musculoskeletal: No acute abnormality. Review of the MIP images confirms the above findings. IMPRESSION: 1. Bilateral pyelonephritis with emphysematous pyelitis. Gas within the bilateral renal collecting systems and urinary bladder. 2. Bilateral renal calculi with 6 mm stone in the proximal right ureter. Mild right hydronephrosis. 3. Negative for an aortic dissection. Limited evaluation of the abdominopelvic vascular structures. The left common iliac artery stent appears to be patent. 4. Extensive scarring in the left kidney. These results were called by telephone at the time of interpretation on 06/26/2018 at 7:26 pm to Dr. Tomi Bamberger , who verbally acknowledged these results. Electronically Signed   By: Markus Daft M.D.   On: 06/26/2018 19:26     LOS: 3 days   Oren Binet, MD  [facility]  If 7PM-7AM, please contact night-coverage  Please page via [email or web address]-Password [email or web address]-click on MD name and type text message  06/29/2018, 12:09 PM

## 2018-06-30 ENCOUNTER — Encounter (HOSPITAL_COMMUNITY): Payer: Self-pay

## 2018-06-30 ENCOUNTER — Ambulatory Visit (HOSPITAL_COMMUNITY): Payer: Medicaid Other

## 2018-06-30 ENCOUNTER — Ambulatory Visit: Payer: Medicaid Other | Admitting: Vascular Surgery

## 2018-06-30 LAB — PROTIME-INR
INR: 1.88
Prothrombin Time: 21.4 seconds — ABNORMAL HIGH (ref 11.4–15.2)

## 2018-06-30 LAB — GLUCOSE, CAPILLARY
Glucose-Capillary: 112 mg/dL — ABNORMAL HIGH (ref 70–99)
Glucose-Capillary: 121 mg/dL — ABNORMAL HIGH (ref 70–99)
Glucose-Capillary: 126 mg/dL — ABNORMAL HIGH (ref 70–99)
Glucose-Capillary: 140 mg/dL — ABNORMAL HIGH (ref 70–99)
Glucose-Capillary: 154 mg/dL — ABNORMAL HIGH (ref 70–99)
Glucose-Capillary: 155 mg/dL — ABNORMAL HIGH (ref 70–99)
Glucose-Capillary: 94 mg/dL (ref 70–99)

## 2018-06-30 LAB — BASIC METABOLIC PANEL
Anion gap: 11 (ref 5–15)
BUN: 53 mg/dL — ABNORMAL HIGH (ref 6–20)
CO2: 22 mmol/L (ref 22–32)
Calcium: 8.8 mg/dL — ABNORMAL LOW (ref 8.9–10.3)
Chloride: 100 mmol/L (ref 98–111)
Creatinine, Ser: 4.8 mg/dL — ABNORMAL HIGH (ref 0.44–1.00)
GFR calc Af Amer: 12 mL/min — ABNORMAL LOW (ref >60)
GFR calc non Af Amer: 10 mL/min — ABNORMAL LOW (ref >60)
Glucose, Bld: 108 mg/dL — ABNORMAL HIGH (ref 70–99)
Potassium: 4.9 mmol/L (ref 3.5–5.1)
Sodium: 133 mmol/L — ABNORMAL LOW (ref 135–145)

## 2018-06-30 LAB — URINE CULTURE: Culture: <10000 — AB

## 2018-06-30 MED ORDER — WARFARIN SODIUM 7.5 MG PO TABS
7.5000 mg | ORAL_TABLET | Freq: Once | ORAL | Status: AC
  Administered 2018-06-30: 7.5 mg via ORAL
  Filled 2018-06-30: qty 1

## 2018-06-30 MED ORDER — SODIUM CHLORIDE 0.9 % IV SOLN
INTRAVENOUS | Status: DC
  Administered 2018-06-30: 13:00:00 via INTRAVENOUS

## 2018-06-30 NOTE — Progress Notes (Addendum)
PROGRESS NOTE        PATIENT DETAILS Name: Terri Sparks Age: 42 y.o. Sex: female Date of Birth: 1976-06-26 Admit Date: 06/26/2018 Admitting Physician Vianne Bulls, MD CBS:[id number], Dalbert Batman, MD  Brief Narrative: Patient is a 42 y.o. 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 coli bacteremia.  CT imaging also showed a 6 mm proximal stone in the right ureter with mild right hydronephrosis.  Urology and IR both were consulted, she subsequently underwent cystoscopy with right stent placement on 9/24.  1 out of 2 blood cultures were subsequently positive for gram-negative rods.  Slowly improving with supportive care-see below for further details.  Subjective: No major issues overnight-continues to have some intermittent pain in her right flank area but overall much better.  Concerned about some edema that has developed in her lower extremities.  She denies any shortness of breath.  Assessment/Plan: Sepsis secondary to bilateral pyelonephritis and E. coli bacteremia: Sepsis physiology has resolved, 1/2 blood cultures positive for pansensitive E. coli-remains on IV Rocephin.  If clinical provement continues, probably can be switched to oral route.  Will need to be on at least 2 weeks of antimicrobial treatment.  Will require repeat blood cultures to be done by PCP after completion of antimicrobial therapy to document clearance of bacteremia  Right ureteral stone with minimal right-sided hydronephrosis: Underwent cystoscopy with right JJ stent placement on 9/24-renal function slowly improving.  Urology consulted but has subsequently signed out.    Acute  kidney injury on CKD stage III: AKI multifactorial-hemodynamically mediated in the setting of sepsis with some contribution from obstructive uropathy.  Creatinine is slowly improving-but is still significantly elevated than her usual baseline-she does appear to have some evidence of mild lower extremity edema-have asked nurse to start checking daily weights.  We will go ahead and decrease IV fluids to [facility] today, if renal function continues to downtrend-suspect we could start Lasix soon.   Lungs are currently clear on exam.  Continue to avoid nephrotoxic agents  Mild volume overload: Secondary to IV fluid resuscitation-AKI-see above regarding plans to start Lasix soon and decrease IV fluids further.  Non-anion gap metabolic acidosis: Likely secondary to AKI-much improved with improvement in renal function-suspect we can discontinue bicarb supplementation and IV fluids.  Hyponatremia: Improving-probably due to AKI/mild volume overload-continue to follow periodically.   Hyperkalemia: Resolved-follow  Hypertension: Controlled-continue to hold all antihypertensives.  Insulin-dependent DM-2: CBG stable-continue Lantus 30 units nightly and SSI.  Follow and adjust accordingly.  CBG stable-continue Lantus 30 units nightly and SSI.   GERD: Continue PPI  History of recurrent thromboembolism causing ischemic left leg x2-required thrombectomy and angioplasty to left tibial artery August 2019: Due to difficulty to maintain therapeutic IV heparin-she has been switched to Lovenox, Coumadin has been restarted-NR remains subtherapeutic.  Continue per pharmacy.  DVT Prophylaxis: Lovenox with overlapping Coumadin  Code Status: Full code   Family Communication: None at bedside  Disposition Plan: Remain inpatient-will require several days of hospitalization before consideration of discharge.  Antimicrobial agents: Anti-infectives (From admission, onward)   Start     Dose/Rate Route Frequency Ordered Stop  06/28/18 1900  vancomycin (VANCOCIN) 1,750 mg in sodium chloride 0.9 % 500 mL IVPB  Status:  Discontinued     1,750 mg 250 mL/hr over 120 Minutes Intravenous Every 48 hours 06/26/18 2022 06/27/18 1319   06/27/18 1800  ceFEPIme (MAXIPIME) 500 mg in dextrose 5 % 50 mL IVPB  Status:  Discontinued     500 mg 100 mL/hr over 30 Minutes Intravenous Every 24 hours 06/26/18 2022 06/27/18 0852   06/27/18 1800  ceFEPIme (MAXIPIME) 1 g in sodium chloride 0.9 % 100 mL IVPB  Status:  Discontinued     1 g 200 mL/hr over 30 Minutes Intravenous Every 24 hours 06/27/18 0852 06/27/18 1319   06/27/18 1500  cefTRIAXone (ROCEPHIN) 2 g in sodium chloride 0.9 % 100 mL IVPB     2 g 200 mL/hr over 30 Minutes Intravenous Every 24 hours 06/27/18 1319     06/26/18 1700  vancomycin (VANCOCIN) 2,000 mg in sodium chloride 0.9 % 500 mL IVPB     2,000 mg 250 mL/hr over 120 Minutes Intravenous  Once 06/26/18 1626 06/26/18 2141   06/26/18 1700  ceFEPIme (MAXIPIME) 2 g in sodium chloride 0.9 % 100 mL IVPB     2 g 200 mL/hr over 30 Minutes Intravenous  Once 06/26/18 1654 06/26/18 1838   06/26/18 1600  vancomycin (VANCOCIN) IVPB 1000 mg/200 mL premix  Status:  Discontinued     1,000 mg 200 mL/hr over 60 Minutes Intravenous  Once 06/26/18 1559 06/26/18 1626      Procedures: 9/24>>Cystoscopy with right JJ stent placement  CONSULTS:  urology  Time spent: 25 minutes-Greater than 50% of this time was spent in counseling, explanation of diagnosis, planning of further management, and coordination of care.  MEDICATIONS: Scheduled Meds: . buPROPion  150 mg Oral BID  . enoxaparin (LOVENOX) injection  120 mg Subcutaneous Q24H  . gabapentin  200 mg Oral BID  . insulin aspart  0-9 Units Subcutaneous Q4H  . insulin glargine  30 Units Subcutaneous QHS  . megestrol  80 mg Oral Daily  . nystatin   Topical QID  . pantoprazole  40 mg Oral Daily  . senna  2 tablet Oral Daily  . sodium chloride flush  3 mL Intravenous Q12H  .  Warfarin - Pharmacist Dosing Inpatient   Does not apply q1800   Continuous Infusions: . cefTRIAXone (ROCEPHIN)  IV 2 g (06/29/18 1614)  .  sodium bicarbonate  infusion 1000 mL 75 mL/hr at 06/30/18 0443  . sodium chloride     PRN Meds:.acetaminophen **OR** [DISCONTINUED] acetaminophen, albuterol, ALPRAZolam, diphenhydrAMINE, hydrOXYzine, magic mouthwash w/lidocaine, morphine injection, [DISCONTINUED] ondansetron **OR** ondansetron (ZOFRAN) IV, oxyCODONE-acetaminophen, sodium chloride   PHYSICAL EXAM: Vital signs: Vitals:   06/29/18 0649 06/29/18 1331 06/29/18 2057 06/30/18 0502  BP: 117/73 124/78 (!) 149/84 132/78  Pulse: 88 92 (!) 112 97  Resp: 16 18 18 18   Temp: 98.2 F (36.8 C) 98.2 F (36.8 C) 99.2 F (37.3 C) 98.8 F (37.1 C)  TempSrc: Oral Oral    SpO2: 96% 96% 100% 100%  Weight:      Height:       Filed Weights   06/27/18 0200  Weight: 125 kg   Body mass index is 43.16 kg/m.   General appearance:Awake, alert, not in any distress.  Eyes:no scleral icterus. HEENT: Atraumatic and Normocephalic Neck: supple, no JVD. Resp:Good air entry bilaterally,no rales or rhonchi CVS: S1 S2 regular, no murmurs.  GI: Bowel sounds present, Non tender and not  distended with no gaurding, rigidity or rebound. Extremities: B/L Lower Ext shows + edema, both legs are warm to touch Neurology:  Non focal Psychiatric: Normal judgment and insight. Normal mood. Musculoskeletal:No digital cyanosis Skin:No Rash, warm and dry Wounds:N/A  I have personally reviewed following labs and imaging studies  LABORATORY DATA: CBC: Recent Labs  Lab 06/26/18 1305 06/26/18 1629 06/27/18 0126 06/28/18 0451 06/29/18 0455  WBC 21.7*  --  15.9* 13.4* 11.5*  NEUTROABS  --   --  13.9*  --   --   HGB 11.9* 10.9* 9.1* 8.3* 8.0*  HCT 38.5 32.0* 28.7* 27.2* 24.8*  MCV 91.9  --  90.5 93.5 88.9  PLT 351  --  269 236 353    Basic Metabolic Panel: Recent Labs  Lab 06/26/18 1305 06/26/18 1629  06/27/18 0126 06/28/18 0451 06/29/18 0455 06/30/18 0446  NA 129* 132* 135 129* 129* 133*  K 5.3* 4.8 4.4 4.5 3.9 4.9  CL 95* 101 105 102 100 100  CO2 15*  --  16* 14* 19* 22  GLUCOSE 366* 343* 197* 168* 187* 108*  BUN 48* 50* 52* 59* 55* 53*  CREATININE 5.87* 6.30* 5.73* 5.31* 4.94* 4.80*  CALCIUM 10.0  --  8.3* 8.0* 8.2* 8.8*  MG  --   --   --  1.5*  --   --     GFR: Estimated Creatinine Clearance: 20.8 mL/min (A) (by C-G formula based on SCr of 4.8 mg/dL (H)).  Liver Function Tests: Recent Labs  Lab 06/26/18 1305 06/27/18 0126  AST 19 15  ALT 14 12  ALKPHOS 97 74  BILITOT 0.6 0.6  PROT 7.2 5.7*  ALBUMIN 2.8* 2.2*   Recent Labs  Lab 06/26/18 1305  LIPASE 15   No results for input(s): AMMONIA in the last 168 hours.  Coagulation Profile: Recent Labs  Lab 06/27/18 0829 06/27/18 1225 06/28/18 0451 06/29/18 0455 06/30/18 0446  INR 2.84 2.18 1.66 1.61 1.88    Cardiac Enzymes: No results for input(s): CKTOTAL, CKMB, CKMBINDEX, TROPONINI in the last 168 hours.  BNP (last 3 results) No results for input(s): PROBNP in the last 8760 hours.  HbA1C: No results for input(s): HGBA1C in the last 72 hours.  CBG: Recent Labs  Lab 06/29/18 1725 06/29/18 2056 06/30/18 0116 06/30/18 0505 06/30/18 0802  GLUCAP 127* 90 126* 94 155*    Lipid Profile: No results for input(s): CHOL, HDL, LDLCALC, TRIG, CHOLHDL, LDLDIRECT in the last 72 hours.  Thyroid Function Tests: No results for input(s): TSH, T4TOTAL, FREET4, T3FREE, THYROIDAB in the last 72 hours.  Anemia Panel: No results for input(s): VITAMINB12, FOLATE, FERRITIN, TIBC, IRON, RETICCTPCT in the last 72 hours.  Urine analysis:    Component Value Date/Time   COLORURINE RED (A) 06/26/2018 1317   APPEARANCEUR TURBID (A) 06/26/2018 1317   LABSPEC  06/26/2018 1317    TEST NOT REPORTED DUE TO COLOR INTERFERENCE OF URINE PIGMENT   PHURINE  06/26/2018 1317    TEST NOT REPORTED DUE TO COLOR INTERFERENCE OF  URINE PIGMENT   GLUCOSEU (A) 06/26/2018 1317    TEST NOT REPORTED DUE TO COLOR INTERFERENCE OF URINE PIGMENT   HGBUR (A) 06/26/2018 1317    TEST NOT REPORTED DUE TO COLOR INTERFERENCE OF URINE PIGMENT   BILIRUBINUR (A) 06/26/2018 1317    TEST NOT REPORTED DUE TO COLOR INTERFERENCE OF URINE PIGMENT   BILIRUBINUR negative 09/29/2017 1317   KETONESUR (A) 06/26/2018 1317    TEST NOT REPORTED DUE TO COLOR  INTERFERENCE OF URINE PIGMENT   PROTEINUR (A) 06/26/2018 1317    TEST NOT REPORTED DUE TO COLOR INTERFERENCE OF URINE PIGMENT   UROBILINOGEN 0.2 06/15/2018 1530   NITRITE (A) 06/26/2018 1317    TEST NOT REPORTED DUE TO COLOR INTERFERENCE OF URINE PIGMENT   LEUKOCYTESUR (A) 06/26/2018 1317    TEST NOT REPORTED DUE TO COLOR INTERFERENCE OF URINE PIGMENT    Sepsis Labs: Lactic Acid, Venous    Component Value Date/Time   LATICACIDVEN 2.5 ([place]) 06/27/2018 0126    MICROBIOLOGY: Recent Results (from the past 240 hour(s))  Blood Culture (routine x 2)     Status: Abnormal   Collection Time: 06/26/18  4:21 PM  Result Value Ref Range Status   Specimen Description BLOOD SITE NOT SPECIFIED  Final   Special Requests   Final    BOTTLES DRAWN AEROBIC AND ANAEROBIC Blood Culture adequate volume   Culture  Setup Time   Final    GRAM NEGATIVE RODS IN BOTH AEROBIC AND ANAEROBIC BOTTLES CRITICAL RESULT CALLED TO, READ BACK BY AND VERIFIED WITH: Hughie Closs PharmD 13:20 06/27/18 ([email or web address]) Performed at [facility], [place] [address] [place], [place] [address]    Culture ESCHERICHIA COLI (A)  Final   Report Status 06/29/2018 FINAL  Final   Organism ID, Bacteria ESCHERICHIA COLI  Final      Susceptibility   Escherichia coli - MIC*    AMPICILLIN 8 SENSITIVE Sensitive     CEFAZOLIN <=4 SENSITIVE Sensitive     CEFEPIME <=1 SENSITIVE Sensitive     CEFTAZIDIME <=1 SENSITIVE Sensitive     CEFTRIAXONE <=1 SENSITIVE Sensitive     CIPROFLOXACIN <=0.25 SENSITIVE Sensitive     GENTAMICIN <=1 SENSITIVE  Sensitive     IMIPENEM <=0.25 SENSITIVE Sensitive     TRIMETH/SULFA <=20 SENSITIVE Sensitive     AMPICILLIN/SULBACTAM 4 SENSITIVE Sensitive     PIP/TAZO <=4 SENSITIVE Sensitive     Extended ESBL NEGATIVE Sensitive     * ESCHERICHIA COLI  Blood Culture ID Panel (Reflexed)     Status: Abnormal   Collection Time: 06/26/18  4:21 PM  Result Value Ref Range Status   Enterococcus species NOT DETECTED NOT DETECTED Final   Listeria monocytogenes NOT DETECTED NOT DETECTED Final   Staphylococcus species NOT DETECTED NOT DETECTED Final   Staphylococcus aureus NOT DETECTED NOT DETECTED Final   Streptococcus species NOT DETECTED NOT DETECTED Final   Streptococcus agalactiae NOT DETECTED NOT DETECTED Final   Streptococcus pneumoniae NOT DETECTED NOT DETECTED Final   Streptococcus pyogenes NOT DETECTED NOT DETECTED Final   Acinetobacter baumannii NOT DETECTED NOT DETECTED Final   Enterobacteriaceae species DETECTED (A) NOT DETECTED Final    Comment: Enterobacteriaceae represent a large family of gram-negative bacteria, not a single organism. CRITICAL RESULT CALLED TO, READ BACK BY AND VERIFIED WITH: Hughie Closs PharmD 13:20 06/27/18 ([email or web address])    Enterobacter cloacae complex NOT DETECTED NOT DETECTED Final   Escherichia coli DETECTED (A) NOT DETECTED Final    Comment: CRITICAL RESULT CALLED TO, READ BACK BY AND VERIFIED WITH: Hughie Closs PharmD 13:20 06/27/18 ([email or web address])    Klebsiella oxytoca NOT DETECTED NOT DETECTED Final   Klebsiella pneumoniae NOT DETECTED NOT DETECTED Final   Proteus species NOT DETECTED NOT DETECTED Final   Serratia marcescens NOT DETECTED NOT DETECTED Final   Carbapenem resistance NOT DETECTED NOT DETECTED Final   Haemophilus influenzae NOT DETECTED NOT DETECTED Final   Neisseria meningitidis NOT DETECTED NOT DETECTED Final   Pseudomonas  aeruginosa NOT DETECTED NOT DETECTED Final   Candida albicans NOT DETECTED NOT DETECTED Final   Candida glabrata NOT DETECTED NOT DETECTED Final    Candida krusei NOT DETECTED NOT DETECTED Final   Candida parapsilosis NOT DETECTED NOT DETECTED Final   Candida tropicalis NOT DETECTED NOT DETECTED Final  Blood Culture (routine x 2)     Status: None (Preliminary result)   Collection Time: 06/26/18  5:52 PM  Result Value Ref Range Status   Specimen Description BLOOD LEFT ARM  Final   Special Requests   Final    BOTTLES DRAWN AEROBIC AND ANAEROBIC Blood Culture adequate volume   Culture   Final    NO GROWTH 4 DAYS Performed at [facility], [place] [address], [place], [place] [address]    Report Status PENDING  Incomplete  MRSA PCR Screening     Status: None   Collection Time: 06/27/18  9:25 AM  Result Value Ref Range Status   MRSA by PCR NEGATIVE NEGATIVE Final    Comment:        The GeneXpert MRSA Assay (FDA approved for NASAL specimens only), is one component of a comprehensive MRSA colonization surveillance program. It is not intended to diagnose MRSA infection nor to guide or monitor treatment for MRSA infections. Performed at [facility], [place] [address] [place], [place] [address]   Surgical PCR screen     Status: None   Collection Time: 06/27/18  3:34 PM  Result Value Ref Range Status   MRSA, PCR NEGATIVE NEGATIVE Final   Staphylococcus aureus NEGATIVE NEGATIVE Final    Comment: (NOTE) The Xpert SA Assay (FDA approved for NASAL specimens in patients 80 years of age and older), is one component of a comprehensive surveillance program. It is not intended to diagnose infection nor to guide or monitor treatment. Performed at [facility], [place] [address] [place], [place] [address]   Urine Culture     Status: Abnormal   Collection Time: 06/28/18 12:01 AM  Result Value Ref Range Status   Specimen Description URINE, RANDOM  Final   Special Requests NONE  Final   Culture (A)  Final    <10,000 COLONIES/mL INSIGNIFICANT GROWTH Performed at [facility], [place] [address], [place], [place]  [address]    Report Status 06/30/2018 FINAL  Final    RADIOLOGY STUDIES/RESULTS: Dg Chest 2 View  Result Date: 06/26/2018 CLINICAL DATA:  shortness of breath, n/v and rt flank pain for two days, weak,hx hematuria EXAM: CHEST - 2 VIEW COMPARISON:  None. FINDINGS: The heart size and mediastinal contours are within normal limits. Both lungs are clear. The visualized skeletal structures are unremarkable. IMPRESSION: No active cardiopulmonary disease. Electronically Signed   By: Nolon Nations M.D.   On: 06/26/2018 13:38   Dg Retrograde Pyelogram  Result Date: 06/28/2018 CLINICAL DATA:  Right-sided ureteral stent placement EXAM: RETROGRADE PYELOGRAM COMPARISON:  CT abdomen pelvis-05/12/2018 FLUOROSCOPY TIME:  19 seconds FINDINGS: A single spot fluoroscopic image of the right mid/lower abdomen is provided for review Provided image demonstrates the superior aspect of a right-sided ureteral stent with superior coil overlying expected location the right renal fossa. The inferior aspect of the stent was not imaged. IMPRESSION: Post right-sided ureteral stent placement. Correlation with the operative report is advised. Electronically Signed   By: Sandi Mariscal M.D.   On: 06/28/2018 07:39   Ct Angio Chest/abd/pel For Dissection W And/or Wo Contrast  Result Date: 06/26/2018 CLINICAL DATA:  42 year old with shortness of breath. Evaluate for aortic dissection. EXAM: CT ANGIOGRAPHY CHEST, ABDOMEN AND PELVIS TECHNIQUE: Multidetector CT imaging through the chest, abdomen and pelvis was performed using the standard protocol during bolus administration of intravenous contrast. Multiplanar reconstructed images and MIPs were obtained and reviewed to evaluate the vascular anatomy. CONTRAST:  193mL ISOVUE-370 IOPAMIDOL (ISOVUE-370) INJECTION 76% COMPARISON:  05/12/2018 and chest CT 08/26/2014 FINDINGS: CTA CHEST FINDINGS Cardiovascular: Normal caliber of the thoracic aorta without dissection or intramural hematoma. Heart size  is normal without significant pericardial fluid. Pulmonary arteries are not opacified with contrast. Typical aortic arch anatomy. Visualized great vessels are patent. Mediastinum/Nodes: Mediastinal structures are unremarkable. No evidence for mediastinal, hilar or axillary lymphadenopathy. Lungs/Pleura: Trachea and mainstem bronchi are patent. No large pleural effusions. Atelectasis in the right lower lobe. There are 2 stable nodular densities along the right major fissure since 2015. The largest measures 7 mm on sequence 11, image 62. These are compatible with benign findings based on the stability. Stable pleural-based nodularity along the anterior right lung on sequence 11, image 75. Nodular density along the left side of the heart on sequence 11, image 90 is probably unchanged since 2015. No large areas of airspace disease or lung consolidation. Musculoskeletal: No acute bone abnormality. Review of the MIP images confirms the above findings. CTA ABDOMEN AND PELVIS FINDINGS VASCULAR Aorta: Abdominal aorta is patent without dissection. Limited evaluation of the aorta due to poor contrast opacification and body habitus. Celiac: Celiac trunk and main branch vessels are patent. SMA: SMA is patent but limited evaluation. Renals: Flow in bilateral renal arteries but limited evaluation. IMA: Limited evaluation. Inflow: Again noted is a stent left common iliac artery. Left common iliac artery stent appears to be patent. There appears to be flow in the left external iliac artery. Limited evaluation of the right common iliac artery. There is flow in the right external iliac artery. Surgical clips of the left groin. Flow in the proximal bilateral femoral arteries but limited evaluation. Veins: No obvious venous abnormality within the limitations of this arterial phase study. Review of the MIP images confirms the above findings. NON-VASCULAR Hepatobiliary: Distention of the gallbladder. No gross abnormality to the liver but  limited evaluation due to streak artifact. Pancreas: Unremarkable. No pancreatic ductal dilatation or surrounding inflammatory changes. Spleen: Normal in size without focal abnormality. Adrenals/Urinary Tract: Normal appearance of the right adrenal gland. There is stable fullness and nodularity in the left adrenal gland measuring up to 1.7 cm and compatible with an adenoma based on exam from 08/17/2015. There is new right perinephric edema. There is a new gas in the bilateral renal collecting systems. Decreased enhancement scattered throughout both kidneys, particularly the right kidney. Findings are suggestive for pyelonephritis. Focal pyelonephritis in the left kidney upper pole. There are several small bilateral renal calculi. There is extensive cortical thinning and scarring in the left kidney. There is now a small stone in the proximal right ureter measuring roughly 6 mm. There is new mild right hydronephrosis. One or two small stones in the right renal pelvis. Minimal dilatation of left renal pelvis. Stomach/Bowel: No acute inflammation involving the bowel structures. Stomach is unremarkable. Lymphatic: No significant lymph node enlargement. Reproductive: Stable appearance of the uterus and adnexal structures. Right adnexa is mildly prominent but poorly characterized on this examination. Other: Trace fluid in the pelvis.  Negative for free air. Musculoskeletal: No acute abnormality. Review of the MIP images confirms the above findings. IMPRESSION: 1. Bilateral pyelonephritis with emphysematous pyelitis.  Gas within the bilateral renal collecting systems and urinary bladder. 2. Bilateral renal calculi with 6 mm stone in the proximal right ureter. Mild right hydronephrosis. 3. Negative for an aortic dissection. Limited evaluation of the abdominopelvic vascular structures. The left common iliac artery stent appears to be patent. 4. Extensive scarring in the left kidney. These results were called by telephone at  the time of interpretation on 06/26/2018 at 7:26 pm to Dr. Tomi Bamberger , who verbally acknowledged these results. Electronically Signed   By: Markus Daft M.D.   On: 06/26/2018 19:26     LOS: 4 days   Oren Binet, MD  [facility]  If 7PM-7AM, please contact night-coverage  Please page via [email or web address]-Password [email or web address]-click on MD name and type text message  06/30/2018, 10:51 AM

## 2018-06-30 NOTE — Progress Notes (Signed)
[place] for Warfarin>>> Lovenox + Warfarin Indication: thrombus  Allergies  Allergen Reactions  . Ciprofloxacin Hcl Hives  . Macrobid [Nitrofurantoin Macrocrystal] Hives, Shortness Of Breath and Rash  . Other Hives, Shortness Of Breath and Rash    NO "-CILLINS"!!!  . Penicillins Shortness Of Breath    Had had cephalosporins without incident Has patient had a PCN reaction causing immediate rash, facial/tongue/throat swelling, SOB or lightheadedness with hypotension: Yes Has patient had a PCN reaction causing severe rash involving mucus membranes or skin necrosis: Unk Has patient had a PCN reaction that required hospitalization: Unk Has patient had a PCN reaction occurring within the last 10 years: No If all of the above answers are "NO", then may proceed with Cephalosporin use.   . Sulfa Antibiotics Hives, Shortness Of Breath and Rash  . Dilaudid [Hydromorphone Hcl] Other (See Comments)    Asystole per pt report  . Lexapro [Escitalopram Oxalate] Other (See Comments)    "I just did not like it."    Patient Measurements: Height: 5\' 7"  (170.2 cm) Weight: 275 lb 9.2 oz (125 kg) IBW/kg (Calculated) : 61.6  Vital Signs: Temp: 98.8 F (37.1 C) (09/27 0502) BP: 132/78 (09/27 0502) Pulse Rate: 97 (09/27 0502)  Labs: Recent Labs    06/28/18 0451 06/28/18 1952 06/29/18 0455 06/29/18 1347 06/30/18 0446  HGB 8.3*  --  8.0*  --   --   HCT 27.2*  --  24.8*  --   --   PLT 236  --  225  --   --   LABPROT 19.5*  --  19.0*  --  21.4*  INR 1.66  --  1.61  --  1.88  HEPARINUNFRC  --  <0.10* <0.10* <0.10*  --   CREATININE 5.31*  --  4.94*  --  4.80*    Estimated Creatinine Clearance: 20.8 mL/min (A) (by C-G formula based on SCr of 4.8 mg/dL (H)).     Assessment: 42 year old female continues on Lovenox / Warfarin for iliac thrombus INR today trending up = 1.88   Goal of Therapy:  INR = 2 to 3 Monitor platelets by anticoagulation  protocol: Yes     Plan:  Lovenox 120 mg sq Q 24 hours Warfarin 7.5 mg po x 1 tonight Check daily INR, Watch CBC   Thank you  Anette Guarneri, PharmD Phone: [phone]

## 2018-07-01 DIAGNOSIS — N170 Acute kidney failure with tubular necrosis: Secondary | ICD-10-CM

## 2018-07-01 LAB — BASIC METABOLIC PANEL
Anion gap: 11 (ref 5–15)
BUN: 48 mg/dL — ABNORMAL HIGH (ref 6–20)
CO2: 23 mmol/L (ref 22–32)
Calcium: 9 mg/dL (ref 8.9–10.3)
Chloride: 97 mmol/L — ABNORMAL LOW (ref 98–111)
Creatinine, Ser: 4.23 mg/dL — ABNORMAL HIGH (ref 0.44–1.00)
GFR calc Af Amer: 14 mL/min — ABNORMAL LOW (ref >60)
GFR calc non Af Amer: 12 mL/min — ABNORMAL LOW (ref >60)
Glucose, Bld: 122 mg/dL — ABNORMAL HIGH (ref 70–99)
Potassium: 4.6 mmol/L (ref 3.5–5.1)
Sodium: 131 mmol/L — ABNORMAL LOW (ref 135–145)

## 2018-07-01 LAB — GLUCOSE, CAPILLARY
Glucose-Capillary: 115 mg/dL — ABNORMAL HIGH (ref 70–99)
Glucose-Capillary: 123 mg/dL — ABNORMAL HIGH (ref 70–99)
Glucose-Capillary: 158 mg/dL — ABNORMAL HIGH (ref 70–99)
Glucose-Capillary: 141 mg/dL — ABNORMAL HIGH (ref 70–99)

## 2018-07-01 LAB — SODIUM, URINE, RANDOM: Sodium, Ur: 56 mmol/L

## 2018-07-01 LAB — PROTIME-INR
INR: 3.19
INR: 3.54
Prothrombin Time: 32.4 seconds — ABNORMAL HIGH (ref 11.4–15.2)
Prothrombin Time: 35.2 seconds — ABNORMAL HIGH (ref 11.4–15.2)

## 2018-07-01 LAB — CULTURE, BLOOD (ROUTINE X 2)
Culture: NO GROWTH
Special Requests: ADEQUATE

## 2018-07-01 LAB — OSMOLALITY, URINE: Osmolality, Ur: 213 mOsm/kg — ABNORMAL LOW (ref 300–900)

## 2018-07-01 LAB — BRAIN NATRIURETIC PEPTIDE: B Natriuretic Peptide: 218.9 pg/mL — ABNORMAL HIGH (ref 0.0–100.0)

## 2018-07-01 MED ORDER — PHENOL 1.4 % MT LIQD
1.0000 | OROMUCOSAL | Status: DC | PRN
  Administered 2018-07-02: 1 via OROMUCOSAL
  Filled 2018-07-01: qty 177

## 2018-07-01 MED ORDER — CEPHALEXIN 500 MG PO CAPS
500.0000 mg | ORAL_CAPSULE | Freq: Two times a day (BID) | ORAL | Status: DC
  Administered 2018-07-02: 500 mg via ORAL
  Filled 2018-07-01: qty 1

## 2018-07-01 NOTE — Progress Notes (Signed)
ANTICOAGULATION CONSULT NOTE  Pharmacy Consult for Lovenox + Warfarin Indication: Hx iliac thrombis  Patient Measurements: Height: 5\' 7"  (170.2 cm) Weight: (!) 309 lb 3.2 oz (140.3 kg) IBW/kg (Calculated) : 61.6  Vital Signs: Temp: 99.1 F (37.3 C) (09/28 0420) BP: 149/62 (09/28 0420) Pulse Rate: 98 (09/28 0420)  Labs: Recent Labs    06/28/18 1952 06/29/18 0455 06/29/18 1347 06/30/18 0446 07/01/18 0341  HGB  --  8.0*  --   --   --   HCT  --  24.8*  --   --   --   PLT  --  225  --   --   --   LABPROT  --  19.0*  --  21.4* 32.4*  INR  --  1.61  --  1.88 3.19  HEPARINUNFRC <0.10* <0.10* <0.10*  --   --   CREATININE  --  4.94*  --  4.80* 4.23*    Estimated Creatinine Clearance: 25.2 mL/min (A) (by C-G formula based on SCr of 4.23 mg/dL (H)).  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 Admit INR 4.82 on PTA dose of 5 mg daily EXCEPT for 10 mg on T/Th/Sat. Warfarin was reversed for cystoscopy and stent placement on 9/24. Post-op, pharmacy consulted to resume warfarin and currently on lovenox bridge.  INR today reflected a large jump up and is now SUPRAtherapeutic (INR 3.19, repeat 3.52 to confirm << 1.88, goal of 2-3). Lovenox is no longer indicated with INR>3  Goal of Therapy:  INR goal of 2-3 Monitor platelets by anticoagulation protocol: Yes   Plan:  - D/c Enox - Hold warfarin dose tonight - Will continue to monitor for any signs/symptoms of bleeding and will follow up with PT/INR in the a.m.   Thank you for allowing pharmacy to be a part of this patient's care.  Alycia Rossetti, PharmD, BCPS Clinical Pharmacist Pager: [phone] Clinical phone for 07/01/2018 from 7a-3:30p: [phone] If after 3:30p, please call main pharmacy at: [phone] Please check AMION for all [place] numbers 07/01/2018 1:00 PM

## 2018-07-01 NOTE — Progress Notes (Signed)
Pharmacy Antibiotic Note  Terri Sparks is a 42 y.o. female admitted on 06/26/2018 with pyelonephritis and hydronephrosis - s/p cystoscopy and R-stent placement on 9/24. Found to have E.coli bacteremia and has been treated with Rocephin. Pharmacy has been consulted to transition Rocephin to Keflex.  Plan: - Rocephin 2g IV x 1 more dose today  - Transition to Keflex 500 mg bid starting on 9/29 - Will continue to trend renal function to adjust to target dose of 500 mg q8h as renal function improves.   Height: 5\' 7"  (170.2 cm) Weight: (!) 309 lb 3.2 oz (140.3 kg) IBW/kg (Calculated) : 61.6  Temp (24hrs), Avg:98.5 F (36.9 C), Min:98.2 F (36.8 C), Max:99.1 F (37.3 C)  Recent Labs  Lab 06/26/18 1305 06/26/18 1503  06/26/18 1630 06/26/18 2035 06/27/18 0126 06/28/18 0451 06/29/18 0455 06/30/18 0446 07/01/18 0341  WBC 21.7*  --   --   --   --  15.9* 13.4* 11.5*  --   --   CREATININE 5.87*  --    < >  --   --  5.73* 5.31* 4.94* 4.80* 4.23*  LATICACIDVEN  --  6.37*  --  4.36* 2.18* 2.5*  --   --   --   --    < > = values in this interval not displayed.    Estimated Creatinine Clearance: 25.2 mL/min (A) (by C-G formula based on SCr of 4.23 mg/dL (H)).    Allergies  Allergen Reactions  . Ciprofloxacin Hcl Hives  . Macrobid [Nitrofurantoin Macrocrystal] Hives, Shortness Of Breath and Rash  . Other Hives, Shortness Of Breath and Rash    NO "-CILLINS"!!!  . Penicillins Shortness Of Breath    Had had cephalosporins without incident Has patient had a PCN reaction causing immediate rash, facial/tongue/throat swelling, SOB or lightheadedness with hypotension: Yes Has patient had a PCN reaction causing severe rash involving mucus membranes or skin necrosis: Unk Has patient had a PCN reaction that required hospitalization: Unk Has patient had a PCN reaction occurring within the last 10 years: No If all of the above answers are "NO", then may proceed with Cephalosporin use.   . Sulfa  Antibiotics Hives, Shortness Of Breath and Rash  . Dilaudid [Hydromorphone Hcl] Other (See Comments)    Asystole per pt report  . Lexapro [Escitalopram Oxalate] Other (See Comments)    "I just did not like it."    Antimicrobials this admission: Ceftriaxone 9/24> 9/28 Cefazolin 9/29 >>  Dose adjustments this admission:   Microbiology results: 9/23 BCx >> 1/2 E.coli (pan-S) 9/24 MRSA PCR >> neg 9/25 UCx >> insignificant growth  Thank you for allowing pharmacy to be a part of this patient's care.  Alycia Rossetti, PharmD, BCPS Clinical Pharmacist Pager: [phone] Clinical phone for 07/01/2018 from 7a-3:30p: [phone] If after 3:30p, please call main pharmacy at: [phone] Please check AMION for all [place] numbers 07/01/2018 11:58 AM

## 2018-07-01 NOTE — Progress Notes (Signed)
PROGRESS NOTE        PATIENT DETAILS Name: Terri Sparks Age: 42 y.o. Sex: female Date of Birth: Aug 23, 1976 Admit Date: 06/26/2018 Admitting Physician Vianne Bulls, MD IRC:VELFYBO, Dalbert Batman, MD  Brief Narrative: Patient is a 42 y.o. 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 coli bacteremia.  CT imaging also showed a 6 mm proximal stone in the right ureter with mild right hydronephrosis.  Urology and IR both were consulted, she subsequently underwent cystoscopy with right stent placement on 9/24.  1 out of 2 blood cultures were subsequently positive for gram-negative rods.  Slowly improving with supportive care-see below for further details.  Subjective:  Patient in bed, appears comfortable, denies any headache, no fever, no chest pain or pressure, no shortness of breath , no abdominal pain. No focal weakness.   Assessment/Plan: Sepsis secondary to bilateral pyelonephritis and E. coli bacteremia: Sepsis physiology has resolved, 1/2 blood cultures positive for pansensitive E. coli-remains on IV Rocephin.  Sepsis physiology seems to have resolved, will switch to oral Keflex today, advance activity, PT eval, if stable will discharge home on 10 more days of oral Keflex.  Right ureteral stone with minimal right-sided hydronephrosis: Underwent cystoscopy with right JJ stent placement on 9/24-renal function slowly improving.  Urology consulted but has subsequently signed out.  Post discharge we will have her follow with urology within a week.  Acute kidney injury on CKD stage III: AKI multifactorial-hemodynamically mediated in the setting of sepsis with  some contribution from obstructive uropathy.  Niccoli improving, repeat BMP in the morning.  Non-anion gap metabolic acidosis: Due to ARF and sepsis has resolved.  Hyponatremia: Thought to be due to volume overload, check urine sodium, repeat BMP in the morning.  No edema or rales today.  Hyperkalemia: Resolved-follow  Hypertension: Controlled-continue to hold all antihypertensives.  Insulin-dependent DM-2: CBG stable-continue Lantus 30 units nightly and SSI.  Follow and adjust accordingly.  CBG stable-continue Lantus 30 units nightly and SSI.   CBG (last 3)  Recent Labs    06/30/18 2015 06/30/18 2357 07/01/18 0804  GLUCAP 121* 154* 115*    GERD: Continue PPI  History of recurrent thromboembolism causing ischemic left leg x2-required thrombectomy and angioplasty to left tibial artery August 2019: Currently on Lovenox and Coumadin overlap pharmacy monitoring.  Lab Results  Component Value Date   INR 3.19 07/01/2018   INR 1.88 06/30/2018   INR 1.61 06/29/2018     DVT Prophylaxis: Lovenox with overlapping Coumadin  Code Status: Full code   Family Communication: None at bedside  Disposition Plan: Discharge in the morning.  Antimicrobial agents: Anti-infectives (From admission, onward)   Start     Dose/Rate Route Frequency Ordered Stop   06/28/18 1900  vancomycin (VANCOCIN) 1,750 mg in sodium chloride 0.9 % 500 mL IVPB  Status:  Discontinued     1,750 mg 250 mL/hr over 120 Minutes Intravenous Every 48 hours 06/26/18 2022 06/27/18 1319   06/27/18 1800  ceFEPIme (MAXIPIME) 500 mg in dextrose 5 % 50 mL IVPB  Status:  Discontinued     500 mg 100 mL/hr over 30  Minutes Intravenous Every 24 hours 06/26/18 2022 06/27/18 0852   06/27/18 1800  ceFEPIme (MAXIPIME) 1 g in sodium chloride 0.9 % 100 mL IVPB  Status:  Discontinued     1 g 200 mL/hr over 30 Minutes Intravenous Every 24 hours 06/27/18 0852 06/27/18 1319   06/27/18 1500  cefTRIAXone (ROCEPHIN) 2 g in sodium chloride  0.9 % 100 mL IVPB     2 g 200 mL/hr over 30 Minutes Intravenous Every 24 hours 06/27/18 1319     06/26/18 1700  vancomycin (VANCOCIN) 2,000 mg in sodium chloride 0.9 % 500 mL IVPB     2,000 mg 250 mL/hr over 120 Minutes Intravenous  Once 06/26/18 1626 06/26/18 2141   06/26/18 1700  ceFEPIme (MAXIPIME) 2 g in sodium chloride 0.9 % 100 mL IVPB     2 g 200 mL/hr over 30 Minutes Intravenous  Once 06/26/18 1654 06/26/18 1838   06/26/18 1600  vancomycin (VANCOCIN) IVPB 1000 mg/200 mL premix  Status:  Discontinued     1,000 mg 200 mL/hr over 60 Minutes Intravenous  Once 06/26/18 1559 06/26/18 1626      Procedures: 9/24>>Cystoscopy with right JJ stent placement  CONSULTS:  urology  Time spent: 25 minutes-Greater than 50% of this time was spent in counseling, explanation of diagnosis, planning of further management, and coordination of care.  MEDICATIONS: Scheduled Meds: . buPROPion  150 mg Oral BID  . enoxaparin (LOVENOX) injection  120 mg Subcutaneous Q24H  . gabapentin  200 mg Oral BID  . insulin aspart  0-9 Units Subcutaneous Q4H  . insulin glargine  30 Units Subcutaneous QHS  . megestrol  80 mg Oral Daily  . nystatin   Topical QID  . pantoprazole  40 mg Oral Daily  . senna  2 tablet Oral Daily  . sodium chloride flush  3 mL Intravenous Q12H  . Warfarin - Pharmacist Dosing Inpatient   Does not apply q1800   Continuous Infusions: . cefTRIAXone (ROCEPHIN)  IV 2 g (06/30/18 1506)  . sodium chloride     PRN Meds:.acetaminophen **OR** [DISCONTINUED] acetaminophen, albuterol, ALPRAZolam, diphenhydrAMINE, hydrOXYzine, magic mouthwash w/lidocaine, morphine injection, [DISCONTINUED] ondansetron **OR** ondansetron (ZOFRAN) IV, oxyCODONE-acetaminophen, sodium chloride   PHYSICAL EXAM: Vital signs: Vitals:   06/30/18 1300 06/30/18 2014 07/01/18 0420 07/01/18 0438  BP: 102/86 122/74 (!) 149/62   Pulse: (!) 102 97 98   Resp: 18 18 18    Temp: 98.2 F (36.8 C) 98.2 F (36.8 C) 99.1  F (37.3 C)   TempSrc: Oral     SpO2: 98% 99% 100%   Weight:    (!) 140.3 kg  Height:       Filed Weights   06/27/18 0200 06/30/18 1200 07/01/18 0438  Weight: 125 kg (!) 146.6 kg (!) 140.3 kg   Body mass index is 48.43 kg/m.   Awake Alert, Oriented X 3, No new F.N deficits, Normal affect [place].AT,PERRAL Supple Neck,No JVD, No cervical lymphadenopathy appriciated.  Symmetrical Chest wall movement, Good air movement bilaterally, CTAB RRR,No Gallops, Rubs or new Murmurs, No Parasternal Heave +ve B.Sounds, Abd Soft, No tenderness, No organomegaly appriciated, No rebound - guarding or rigidity. No Cyanosis, Clubbing or edema, No new Rash or bruise   I have personally reviewed following labs and imaging studies  LABORATORY DATA: CBC: Recent Labs  Lab 06/26/18 1305 06/26/18 1629 06/27/18 0126 06/28/18 0451 06/29/18 0455  WBC 21.7*  --  15.9* 13.4* 11.5*  NEUTROABS  --   --  13.9*  --   --  HGB 11.9* 10.9* 9.1* 8.3* 8.0*  HCT 38.5 32.0* 28.7* 27.2* 24.8*  MCV 91.9  --  90.5 93.5 88.9  PLT 351  --  269 236 248    Basic Metabolic Panel: Recent Labs  Lab 06/27/18 0126 06/28/18 0451 06/29/18 0455 06/30/18 0446 07/01/18 0341  NA 135 129* 129* 133* 131*  K 4.4 4.5 3.9 4.9 4.6  CL 105 102 100 100 97*  CO2 16* 14* 19* 22 23  GLUCOSE 197* 168* 187* 108* 122*  BUN 52* 59* 55* 53* 48*  CREATININE 5.73* 5.31* 4.94* 4.80* 4.23*  CALCIUM 8.3* 8.0* 8.2* 8.8* 9.0  MG  --  1.5*  --   --   --     GFR: Estimated Creatinine Clearance: 25.2 mL/min (A) (by C-G formula based on SCr of 4.23 mg/dL (H)).  Liver Function Tests: Recent Labs  Lab 06/26/18 1305 06/27/18 0126  AST 19 15  ALT 14 12  ALKPHOS 97 74  BILITOT 0.6 0.6  PROT 7.2 5.7*  ALBUMIN 2.8* 2.2*   Recent Labs  Lab 06/26/18 1305  LIPASE 15   No results for input(s): AMMONIA in the last 168 hours.  Coagulation Profile: Recent Labs  Lab 06/27/18 1225 06/28/18 0451 06/29/18 0455 06/30/18 0446  07/01/18 0341  INR 2.18 1.66 1.61 1.88 3.19    Cardiac Enzymes: No results for input(s): CKTOTAL, CKMB, CKMBINDEX, TROPONINI in the last 168 hours.  BNP (last 3 results) No results for input(s): PROBNP in the last 8760 hours.  HbA1C: No results for input(s): HGBA1C in the last 72 hours.  CBG: Recent Labs  Lab 06/30/18 1150 06/30/18 1705 06/30/18 2015 06/30/18 2357 07/01/18 0804  GLUCAP 140* 112* 121* 154* 115*    Lipid Profile: No results for input(s): CHOL, HDL, LDLCALC, TRIG, CHOLHDL, LDLDIRECT in the last 72 hours.  Thyroid Function Tests: No results for input(s): TSH, T4TOTAL, FREET4, T3FREE, THYROIDAB in the last 72 hours.  Anemia Panel: No results for input(s): VITAMINB12, FOLATE, FERRITIN, TIBC, IRON, RETICCTPCT in the last 72 hours.  Urine analysis:    Component Value Date/Time   COLORURINE RED (A) 06/26/2018 1317   APPEARANCEUR TURBID (A) 06/26/2018 1317   LABSPEC  06/26/2018 1317    TEST NOT REPORTED DUE TO COLOR INTERFERENCE OF URINE PIGMENT   PHURINE  06/26/2018 1317    TEST NOT REPORTED DUE TO COLOR INTERFERENCE OF URINE PIGMENT   GLUCOSEU (A) 06/26/2018 1317    TEST NOT REPORTED DUE TO COLOR INTERFERENCE OF URINE PIGMENT   HGBUR (A) 06/26/2018 1317    TEST NOT REPORTED DUE TO COLOR INTERFERENCE OF URINE PIGMENT   BILIRUBINUR (A) 06/26/2018 1317    TEST NOT REPORTED DUE TO COLOR INTERFERENCE OF URINE PIGMENT   BILIRUBINUR negative 09/29/2017 1317   KETONESUR (A) 06/26/2018 1317    TEST NOT REPORTED DUE TO COLOR INTERFERENCE OF URINE PIGMENT   PROTEINUR (A) 06/26/2018 1317    TEST NOT REPORTED DUE TO COLOR INTERFERENCE OF URINE PIGMENT   UROBILINOGEN 0.2 06/15/2018 1530   NITRITE (A) 06/26/2018 1317    TEST NOT REPORTED DUE TO COLOR INTERFERENCE OF URINE PIGMENT   LEUKOCYTESUR (A) 06/26/2018 1317    TEST NOT REPORTED DUE TO COLOR INTERFERENCE OF URINE PIGMENT    Sepsis Labs: Lactic Acid, Venous    Component Value Date/Time   LATICACIDVEN  2.5 ([place]) 06/27/2018 0126    MICROBIOLOGY: Recent Results (from the past 240 hour(s))  Blood Culture (routine x 2)     Status:  Abnormal   Collection Time: 06/26/18  4:21 PM  Result Value Ref Range Status   Specimen Description BLOOD SITE NOT SPECIFIED  Final   Special Requests   Final    BOTTLES DRAWN AEROBIC AND ANAEROBIC Blood Culture adequate volume   Culture  Setup Time   Final    GRAM NEGATIVE RODS IN BOTH AEROBIC AND ANAEROBIC BOTTLES CRITICAL RESULT CALLED TO, READ BACK BY AND VERIFIED WITH: Hughie Closs PharmD 13:20 06/27/18 ([email or web address]) Performed at [facility], [place] [address], [place], [place] [address]    Culture ESCHERICHIA COLI (A)  Final   Report Status 06/29/2018 FINAL  Final   Organism ID, Bacteria ESCHERICHIA COLI  Final      Susceptibility   Escherichia coli - MIC*    AMPICILLIN 8 SENSITIVE Sensitive     CEFAZOLIN <=4 SENSITIVE Sensitive     CEFEPIME <=1 SENSITIVE Sensitive     CEFTAZIDIME <=1 SENSITIVE Sensitive     CEFTRIAXONE <=1 SENSITIVE Sensitive     CIPROFLOXACIN <=0.25 SENSITIVE Sensitive     GENTAMICIN <=1 SENSITIVE Sensitive     IMIPENEM <=0.25 SENSITIVE Sensitive     TRIMETH/SULFA <=20 SENSITIVE Sensitive     AMPICILLIN/SULBACTAM 4 SENSITIVE Sensitive     PIP/TAZO <=4 SENSITIVE Sensitive     Extended ESBL NEGATIVE Sensitive     * ESCHERICHIA COLI  Blood Culture ID Panel (Reflexed)     Status: Abnormal   Collection Time: 06/26/18  4:21 PM  Result Value Ref Range Status   Enterococcus species NOT DETECTED NOT DETECTED Final   Listeria monocytogenes NOT DETECTED NOT DETECTED Final   Staphylococcus species NOT DETECTED NOT DETECTED Final   Staphylococcus aureus NOT DETECTED NOT DETECTED Final   Streptococcus species NOT DETECTED NOT DETECTED Final   Streptococcus agalactiae NOT DETECTED NOT DETECTED Final   Streptococcus pneumoniae NOT DETECTED NOT DETECTED Final   Streptococcus pyogenes NOT DETECTED NOT DETECTED Final   Acinetobacter baumannii  NOT DETECTED NOT DETECTED Final   Enterobacteriaceae species DETECTED (A) NOT DETECTED Final    Comment: Enterobacteriaceae represent a large family of gram-negative bacteria, not a single organism. CRITICAL RESULT CALLED TO, READ BACK BY AND VERIFIED WITH: Hughie Closs PharmD 13:20 06/27/18 ([email or web address])    Enterobacter cloacae complex NOT DETECTED NOT DETECTED Final   Escherichia coli DETECTED (A) NOT DETECTED Final    Comment: CRITICAL RESULT CALLED TO, READ BACK BY AND VERIFIED WITH: Hughie Closs PharmD 13:20 06/27/18 ([email or web address])    Klebsiella oxytoca NOT DETECTED NOT DETECTED Final   Klebsiella pneumoniae NOT DETECTED NOT DETECTED Final   Proteus species NOT DETECTED NOT DETECTED Final   Serratia marcescens NOT DETECTED NOT DETECTED Final   Carbapenem resistance NOT DETECTED NOT DETECTED Final   Haemophilus influenzae NOT DETECTED NOT DETECTED Final   Neisseria meningitidis NOT DETECTED NOT DETECTED Final   Pseudomonas aeruginosa NOT DETECTED NOT DETECTED Final   Candida albicans NOT DETECTED NOT DETECTED Final   Candida glabrata NOT DETECTED NOT DETECTED Final   Candida krusei NOT DETECTED NOT DETECTED Final   Candida parapsilosis NOT DETECTED NOT DETECTED Final   Candida tropicalis NOT DETECTED NOT DETECTED Final  Blood Culture (routine x 2)     Status: None   Collection Time: 06/26/18  5:52 PM  Result Value Ref Range Status   Specimen Description BLOOD LEFT ARM  Final   Special Requests   Final    BOTTLES DRAWN AEROBIC AND ANAEROBIC Blood Culture adequate volume   Culture  Final    NO GROWTH 5 DAYS Performed at [facility], [place] [address], [place], [place] [address]    Report Status 07/01/2018 FINAL  Final  MRSA PCR Screening     Status: None   Collection Time: 06/27/18  9:25 AM  Result Value Ref Range Status   MRSA by PCR NEGATIVE NEGATIVE Final    Comment:        The GeneXpert MRSA Assay (FDA approved for NASAL specimens only), is one component of a comprehensive MRSA  colonization surveillance program. It is not intended to diagnose MRSA infection nor to guide or monitor treatment for MRSA infections. Performed at [facility], [place] [address], [place], [place] [address]   Surgical PCR screen     Status: None   Collection Time: 06/27/18  3:34 PM  Result Value Ref Range Status   MRSA, PCR NEGATIVE NEGATIVE Final   Staphylococcus aureus NEGATIVE NEGATIVE Final    Comment: (NOTE) The Xpert SA Assay (FDA approved for NASAL specimens in patients 24 years of age and older), is one component of a comprehensive surveillance program. It is not intended to diagnose infection nor to guide or monitor treatment. Performed at [facility], [place] [address], [place], [place] [address]   Urine Culture     Status: Abnormal   Collection Time: 06/28/18 12:01 AM  Result Value Ref Range Status   Specimen Description URINE, RANDOM  Final   Special Requests NONE  Final   Culture (A)  Final    <10,000 COLONIES/mL INSIGNIFICANT GROWTH Performed at [facility], [place] [address] [place], [place] [address]    Report Status 06/30/2018 FINAL  Final    RADIOLOGY STUDIES/RESULTS: Dg Chest 2 View  Result Date: 06/26/2018 CLINICAL DATA:  shortness of breath, n/v and rt flank pain for two days, weak,hx hematuria EXAM: CHEST - 2 VIEW COMPARISON:  None. FINDINGS: The heart size and mediastinal contours are within normal limits. Both lungs are clear. The visualized skeletal structures are unremarkable. IMPRESSION: No active cardiopulmonary disease. Electronically Signed   By: Nolon Nations M.D.   On: 06/26/2018 13:38   Dg Retrograde Pyelogram  Result Date: 06/28/2018 CLINICAL DATA:  Right-sided ureteral stent placement EXAM: RETROGRADE PYELOGRAM COMPARISON:  CT abdomen pelvis-05/12/2018 FLUOROSCOPY TIME:  19 seconds FINDINGS: A single spot fluoroscopic image of the right mid/lower abdomen is provided for review Provided image demonstrates the superior aspect  of a right-sided ureteral stent with superior coil overlying expected location the right renal fossa. The inferior aspect of the stent was not imaged. IMPRESSION: Post right-sided ureteral stent placement. Correlation with the operative report is advised. Electronically Signed   By: Sandi Mariscal M.D.   On: 06/28/2018 07:39   Ct Angio Chest/abd/pel For Dissection W And/or Wo Contrast  Result Date: 06/26/2018 CLINICAL DATA:  42 year old with shortness of breath. Evaluate for aortic dissection. EXAM: CT ANGIOGRAPHY CHEST, ABDOMEN AND PELVIS TECHNIQUE: Multidetector CT imaging through the chest, abdomen and pelvis was performed using the standard protocol during bolus administration of intravenous contrast. Multiplanar reconstructed images and MIPs were obtained and reviewed to evaluate the vascular anatomy. CONTRAST:  175mL ISOVUE-370 IOPAMIDOL (ISOVUE-370) INJECTION 76% COMPARISON:  05/12/2018 and chest CT 08/26/2014 FINDINGS: CTA CHEST FINDINGS Cardiovascular: Normal caliber of the thoracic aorta without dissection or intramural hematoma. Heart size is normal without significant pericardial fluid. Pulmonary arteries are not opacified with contrast. Typical aortic arch anatomy. Visualized great vessels are patent. Mediastinum/Nodes: Mediastinal structures are  unremarkable. No evidence for mediastinal, hilar or axillary lymphadenopathy. Lungs/Pleura: Trachea and mainstem bronchi are patent. No large pleural effusions. Atelectasis in the right lower lobe. There are 2 stable nodular densities along the right major fissure since 2015. The largest measures 7 mm on sequence 11, image 62. These are compatible with benign findings based on the stability. Stable pleural-based nodularity along the anterior right lung on sequence 11, image 75. Nodular density along the left side of the heart on sequence 11, image 90 is probably unchanged since 2015. No large areas of airspace disease or lung consolidation. Musculoskeletal: No  acute bone abnormality. Review of the MIP images confirms the above findings. CTA ABDOMEN AND PELVIS FINDINGS VASCULAR Aorta: Abdominal aorta is patent without dissection. Limited evaluation of the aorta due to poor contrast opacification and body habitus. Celiac: Celiac trunk and main branch vessels are patent. SMA: SMA is patent but limited evaluation. Renals: Flow in bilateral renal arteries but limited evaluation. IMA: Limited evaluation. Inflow: Again noted is a stent left common iliac artery. Left common iliac artery stent appears to be patent. There appears to be flow in the left external iliac artery. Limited evaluation of the right common iliac artery. There is flow in the right external iliac artery. Surgical clips of the left groin. Flow in the proximal bilateral femoral arteries but limited evaluation. Veins: No obvious venous abnormality within the limitations of this arterial phase study. Review of the MIP images confirms the above findings. NON-VASCULAR Hepatobiliary: Distention of the gallbladder. No gross abnormality to the liver but limited evaluation due to streak artifact. Pancreas: Unremarkable. No pancreatic ductal dilatation or surrounding inflammatory changes. Spleen: Normal in size without focal abnormality. Adrenals/Urinary Tract: Normal appearance of the right adrenal gland. There is stable fullness and nodularity in the left adrenal gland measuring up to 1.7 cm and compatible with an adenoma based on exam from 08/17/2015. There is new right perinephric edema. There is a new gas in the bilateral renal collecting systems. Decreased enhancement scattered throughout both kidneys, particularly the right kidney. Findings are suggestive for pyelonephritis. Focal pyelonephritis in the left kidney upper pole. There are several small bilateral renal calculi. There is extensive cortical thinning and scarring in the left kidney. There is now a small stone in the proximal right ureter measuring  roughly 6 mm. There is new mild right hydronephrosis. One or two small stones in the right renal pelvis. Minimal dilatation of left renal pelvis. Stomach/Bowel: No acute inflammation involving the bowel structures. Stomach is unremarkable. Lymphatic: No significant lymph node enlargement. Reproductive: Stable appearance of the uterus and adnexal structures. Right adnexa is mildly prominent but poorly characterized on this examination. Other: Trace fluid in the pelvis.  Negative for free air. Musculoskeletal: No acute abnormality. Review of the MIP images confirms the above findings. IMPRESSION: 1. Bilateral pyelonephritis with emphysematous pyelitis. Gas within the bilateral renal collecting systems and urinary bladder. 2. Bilateral renal calculi with 6 mm stone in the proximal right ureter. Mild right hydronephrosis. 3. Negative for an aortic dissection. Limited evaluation of the abdominopelvic vascular structures. The left common iliac artery stent appears to be patent. 4. Extensive scarring in the left kidney. These results were called by telephone at the time of interpretation on 06/26/2018 at 7:26 pm to Dr. Tomi Bamberger , who verbally acknowledged these results. Electronically Signed   By: Markus Daft M.D.   On: 06/26/2018 19:26     LOS: 5 days   Signature  Lala Lund M.D on 07/01/2018  at 11:17 AM  To page go to [email or web address] - password [email or web address]

## 2018-07-02 DIAGNOSIS — E1100 Type 2 diabetes mellitus with hyperosmolarity without nonketotic hyperglycemic-hyperosmolar coma (NKHHC): Secondary | ICD-10-CM

## 2018-07-02 LAB — BASIC METABOLIC PANEL
Anion gap: 10 (ref 5–15)
BUN: 42 mg/dL — ABNORMAL HIGH (ref 6–20)
CO2: 23 mmol/L (ref 22–32)
Calcium: 9.2 mg/dL (ref 8.9–10.3)
Chloride: 100 mmol/L (ref 98–111)
Creatinine, Ser: 3.65 mg/dL — ABNORMAL HIGH (ref 0.44–1.00)
GFR calc Af Amer: 16 mL/min — ABNORMAL LOW (ref >60)
GFR calc non Af Amer: 14 mL/min — ABNORMAL LOW (ref >60)
Glucose, Bld: 130 mg/dL — ABNORMAL HIGH (ref 70–99)
Potassium: 5.5 mmol/L — ABNORMAL HIGH (ref 3.5–5.1)
Sodium: 133 mmol/L — ABNORMAL LOW (ref 135–145)

## 2018-07-02 LAB — GLUCOSE, CAPILLARY
Glucose-Capillary: 122 mg/dL — ABNORMAL HIGH (ref 70–99)
Glucose-Capillary: 125 mg/dL — ABNORMAL HIGH (ref 70–99)
Glucose-Capillary: 126 mg/dL — ABNORMAL HIGH (ref 70–99)
Glucose-Capillary: 132 mg/dL — ABNORMAL HIGH (ref 70–99)
Glucose-Capillary: 136 mg/dL — ABNORMAL HIGH (ref 70–99)
Glucose-Capillary: 200 mg/dL — ABNORMAL HIGH (ref 70–99)

## 2018-07-02 LAB — POTASSIUM: Potassium: 5.6 mmol/L — ABNORMAL HIGH (ref 3.5–5.1)

## 2018-07-02 LAB — PROTIME-INR
INR: 3.73
Prothrombin Time: 36.6 seconds — ABNORMAL HIGH (ref 11.4–15.2)

## 2018-07-02 MED ORDER — TRAMADOL HCL 50 MG PO TABS: 50.0000 mg | ORAL_TABLET | Freq: Every day | ORAL | 0 refills | Status: DC | PRN

## 2018-07-02 MED ORDER — WARFARIN SODIUM 5 MG PO TABS: 5.0000 mg | ORAL_TABLET | ORAL | Status: DC

## 2018-07-02 MED ORDER — POLYETHYLENE GLYCOL 3350 17 G PO PACK
17.0000 g | PACK | Freq: Two times a day (BID) | ORAL | Status: DC
  Administered 2018-07-02 – 2018-07-03 (×2): 17 g via ORAL
  Filled 2018-07-02 (×5): qty 1

## 2018-07-02 MED ORDER — MAGNESIUM HYDROXIDE 400 MG/5ML PO SUSP
30.0000 mL | Freq: Two times a day (BID) | ORAL | Status: AC
  Administered 2018-07-02 (×2): 30 mL via ORAL
  Filled 2018-07-02 (×2): qty 30

## 2018-07-02 MED ORDER — SODIUM ZIRCONIUM CYCLOSILICATE 10 G PO PACK
10.0000 g | PACK | Freq: Every day | ORAL | Status: DC
  Administered 2018-07-02: 10 g via ORAL
  Filled 2018-07-02 (×2): qty 1

## 2018-07-02 MED ORDER — POLYETHYLENE GLYCOL 3350 17 G PO PACK: 17.0000 g | PACK | Freq: Every day | ORAL | 0 refills | Status: DC

## 2018-07-02 MED ORDER — FUROSEMIDE 10 MG/ML IJ SOLN
40.0000 mg | Freq: Once | INTRAMUSCULAR | Status: AC
  Administered 2018-07-02: 40 mg via INTRAVENOUS
  Filled 2018-07-02: qty 4

## 2018-07-02 MED ORDER — FUROSEMIDE 40 MG PO TABS: 40.0000 mg | ORAL_TABLET | Freq: Every day | ORAL | 0 refills | Status: DC

## 2018-07-02 MED ORDER — CEPHALEXIN 500 MG PO CAPS
500.0000 mg | ORAL_CAPSULE | Freq: Three times a day (TID) | ORAL | Status: DC
  Administered 2018-07-02 – 2018-07-05 (×10): 500 mg via ORAL
  Filled 2018-07-02 (×10): qty 1

## 2018-07-02 MED ORDER — BISACODYL 10 MG RE SUPP
10.0000 mg | Freq: Every day | RECTAL | Status: DC
  Filled 2018-07-02 (×3): qty 1

## 2018-07-02 MED ORDER — CEPHALEXIN 500 MG PO CAPS: 500.0000 mg | ORAL_CAPSULE | Freq: Three times a day (TID) | ORAL | 0 refills | Status: DC

## 2018-07-02 MED ORDER — SODIUM CHLORIDE 0.9 % IV BOLUS: 500.0000 mL | Freq: Once | INTRAVENOUS | Status: DC

## 2018-07-02 NOTE — Progress Notes (Signed)
PHARMACY NOTE:  ANTIMICROBIAL RENAL DOSAGE ADJUSTMENT  Current antimicrobial regimen includes a mismatch between antimicrobial dosage and estimated renal function.  As per policy approved by the Pharmacy & Therapeutics and Medical Executive Committees, the antimicrobial dosage will be adjusted accordingly.  Current antimicrobial dosage:  Keflex 500 mg bid  Indication: E.coli UTI/pyelo, transient bacteremia in 1 of 2 blood cultures  Renal Function:  Estimated Creatinine Clearance: 29 mL/min (A) (by C-G formula based on SCr of 3.65 mg/dL (H)). []      On intermittent HD, scheduled: []      On CRRT    Antimicrobial dosage has been changed to:  Keflex 500 mg tid  Additional comments: Noted plans for discharge today with appropriate dosage reflected on discharge summary.    Thank you for allowing pharmacy to be a part of this patient's care.  Alycia Rossetti, PharmD, BCPS Pager: [phone] 11:06 AM

## 2018-07-02 NOTE — Progress Notes (Signed)
PT Cancellation Note  Patient Details Name: Terri Sparks MRN: [id number] DOB: May 18, 1976   Cancelled Treatment:    Reason Eval/Treat Not Completed: PT screened, no needs identified, will sign off -- See PT eval dated 9/25 for initial evaluation. Pt is independently ambulatory.  Recommend nursing supervise walking in hallways.  No PT follow up indicated.   [place] Hard [facility] 07/02/2018, 9:18 AM

## 2018-07-02 NOTE — Progress Notes (Signed)
Patient follows at [facility] for MD and meds. Pharmacy closed on the weekend, has antibiotic that needs filled today. Patient provided with [facility] letter

## 2018-07-02 NOTE — Progress Notes (Signed)
ANTICOAGULATION CONSULT NOTE  Pharmacy Consult for Warfarin Indication: Hx iliac thrombis  Patient Measurements: Height: 5\' 7"  (170.2 cm) Weight: (!) 305 lb 14.4 oz (138.8 kg) IBW/kg (Calculated) : 61.6  Vital Signs: Temp: 99 F (37.2 C) (09/29 0557) Temp Source: Oral (09/29 0557) BP: 115/52 (09/29 0557) Pulse Rate: 101 (09/29 0557)  Labs: Recent Labs    06/29/18 1347  06/30/18 0446 07/01/18 0341 07/01/18 1200 07/02/18 0608  LABPROT  --    < > 21.4* 32.4* 35.2* 36.6*  INR  --    < > 1.88 3.19 3.54 3.73  HEPARINUNFRC <0.10*  --   --   --   --   --   CREATININE  --   --  4.80* 4.23*  --  3.65*   < > = values in this interval not displayed.    Estimated Creatinine Clearance: 29 mL/min (A) (by C-G formula based on SCr of 3.65 mg/dL (H)).  Assessment: 56 YOF on warfarin PTA for h/o iliac thrombosis on warfarin PTA. Admit INR 4.82 on PTA dose of 5 mg daily EXCEPT for 10 mg on T/Th/Sat. Warfarin was reversed for cystoscopy and stent placement on 9/24. Post-op, pharmacy consulted to resume warfarin and currently on lovenox bridge.  INR today remains SUPRAtherapeutic (INR 3.73 << 3.54, goal o 2-3). Will continue to hold warfarin.  Noted plans for discharge today- made recommendations to MD to continue to hold warfarin for at least 2 more days with a outpatient INR check needed by Wed, 10/2  Goal of Therapy:  INR goal of 2-3 Monitor platelets by anticoagulation protocol: Yes   Plan:  - D/c Enox - Hold warfarin dose tonight - Will continue to monitor for any signs/symptoms of bleeding and will follow up with PT/INR in the a.m. (if still here)  Thank you for allowing pharmacy to be a part of this patient's care.  Alycia Rossetti, PharmD, BCPS Clinical Pharmacist Pager: [phone] Clinical phone for 07/02/2018 from 7a-3:30p: [phone] If after 3:30p, please call main pharmacy at: [phone] Please check AMION for all [place] numbers 07/02/2018 11:03 AM

## 2018-07-02 NOTE — Discharge Summary (Addendum)
Terri Sparks:[id number] DOB: 1976/05/19 DOA: 06/26/2018  PCP: Ladell Pier, MD  Admit date: 06/26/2018  Discharge date: 07/05/2018  Admitted From: [facility]   Disposition:  Home   Recommendations for Outpatient Follow-up:   Follow up with PCP in 1-2 weeks  PCP Please obtain BMP/CBC, 2 view CXR in 1week,  (see Discharge instructions)   PCP Please follow up on the following pending results: Please monitor renal function and diuretic dose closely   Home Health: None Equipment/Devices: None Consultations: Urology Discharge Condition: Stable CODE STATUS: Full Diet Recommendation: Heart Healthy low carbohydrate  Chief Complaint  Patient presents with  . Shortness of Breath     Brief history of present illness from the day of admission and additional interim summary    Patient is a 42 y.o. 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 coli bacteremia.  CT imaging also showed a 6 mm proximal stone in the right ureter with mild right hydronephrosis.  Urology and IR both were consulted, she subsequently underwent cystoscopy with right stent placement on 9/24.  1 out of 2 blood cultures were subsequently positive for gram-negative rods.  Slowly improving with supportive care-see below for further details.                                                                 Hospital Course   Sepsis secondary to bilateral pyelonephritis and E. coli bacteremia: Sepsis physiology has resolved, 1/2 blood cultures  positive for pansensitive E. coli-remains on IV Rocephin.  Sepsis physiology seems to have resolved, she has tolerated now oral Keflex for a day and will be placed on it for 7 more days to complete a total of 11-day course, post discharge she will follow with urology along with PCP, request PCP to recheck BMP, INR, CBC within 3 to 4 days of discharge.  Right ureteral stone with minimal right-sided hydronephrosis: Underwent cystoscopy with right JJ stent placement on 9/24-renal function slowly improving.  Urology consulted but has subsequently signed out.  Post discharge we will have her follow with urology within a week.  Acute kidney injury on CKD stage III: AKI multifactorial-hemodynamically mediated in the setting of sepsis with some contribution from obstructive uropathy.  Creatinine peaked at 6.5, her renal function has dramatically improved, she has now some evidence of fluid overload due to hydration which she required for sepsis, will discontinue her ACE inhibitor upon discharge and place her on low-dose Lasix, request PCP to kindly monitor BMP and diuretic dose within 3 to 4 days of discharge to make  sure her renal function continues to improve well with diuresis  Non-anion gap metabolic acidosis: Due to ARF and sepsis has resolved.  Hyponatremia:  Due to dilution from fluid overload, start gentle Lasix and monitor by PCP upon discharge.  Hyperkalemia:  Has underlying RTA for due to her DM type II, note patient received multiple medications including Lasix 80 mg IV, several doses of Kayexalate, several doses of Lokelma and Valtesa, she had over 10 bowel movements with GoLYTELY, potassium has come down but still on the higher side I discussed the case with nephrologist Dr. Johnney Ou who recommended patient can be discharged on [facility] with outpatient PCP and nephrology follow-up.  She has been requested to be on a low potassium diet as well.   Note after intense efforts by case management it  was found that patient could not afford lokelma medication despite assistance, Kayexalate was prescribed instead.   Hypertension: Controlled-continue to hold all antihypertensives.  Insulin-dependent DM-2: Renew home regimen and follow with PCP for glycemic control.   GERD: Continue PPI  History of recurrent thromboembolism causing ischemic left leg x2-required thrombectomy and angioplasty to left tibial artery August 2019:  On Coumadin INR is 3.7 today, asked to hold Coumadin for 2 more days and follow with PCP in 3 days for repeat INR check.   Discharge diagnosis     Principal Problem:   Sepsis due to urinary tract infection (HCC) Active Problems:   Diabetes mellitus, type II (HCC)   Anxiety and depression   Thrombosis of left iliac artery (HCC)   Acute renal failure superimposed on stage 3 chronic kidney disease (HCC)   Emphysematous pyelitis   Right ureteral stone   Essential hypertension   Supratherapeutic INR   Pyelonephritis    Discharge instructions    Discharge Instructions    Diet - low sodium heart healthy   Complete by:  As directed    Discharge instructions   Complete by:  As directed    Follow with Primary MD Ladell Pier, MD in 2 days   Get CBC, CMP, INR checked  by Primary MD in 2 days    Activity: As tolerated with Full fall precautions use walker/cane & assistance as needed  Disposition Home    Diet: Low Potassiium Low Carb. Check CBGs QA CHS.  1.5 L/day total fluid restriction   Special Instructions: If you have smoked or chewed Tobacco  in the last 2 yrs please stop smoking, stop any regular Alcohol  and or any Recreational drug use.  On your next visit with your primary care physician please Get Medicines reviewed and adjusted.  Please request your Prim.MD to go over all Hospital Tests and Procedure/Radiological results at the follow up, please get all Hospital records sent to your Prim MD by signing hospital release before you go  home.  If you experience worsening of your admission symptoms, develop shortness of breath, life threatening emergency, suicidal or homicidal thoughts you must seek medical attention immediately by calling 911 or calling your MD immediately  if symptoms less severe.  You Must read complete instructions/literature along with all the possible adverse reactions/side effects for all the Medicines you take and that have been prescribed to you. Take any new Medicines after you have completely understood and accpet all the possible adverse reactions/side effects.   Increase activity slowly   Complete by:  As directed       Discharge Medications   Allergies as of 07/05/2018      Reactions  Ciprofloxacin Hcl Hives   Macrobid [nitrofurantoin Macrocrystal] Hives, Shortness Of Breath, Rash   Other Hives, Shortness Of Breath, Rash   NO "-CILLINS"!!!   Penicillins Shortness Of Breath   Had had cephalosporins without incident Has patient had a PCN reaction causing immediate rash, facial/tongue/throat swelling, SOB or lightheadedness with hypotension: Yes Has patient had a PCN reaction causing severe rash involving mucus membranes or skin necrosis: Unk Has patient had a PCN reaction that required hospitalization: Unk Has patient had a PCN reaction occurring within the last 10 years: No If all of the above answers are "NO", then may proceed with Cephalosporin use.   Sulfa Antibiotics Hives, Shortness Of Breath, Rash   Dilaudid [hydromorphone Hcl] Other (See Comments)   Asystole per pt report   Lexapro [escitalopram Oxalate] Other (See Comments)   "I just did not like it."      Medication List    STOP taking these medications   hydrOXYzine 25 MG tablet Commonly known as:  ATARAX/VISTARIL   lisinopril 5 MG tablet Commonly known as:  PRINIVIL,ZESTRIL   naproxen sodium 220 MG tablet Commonly known as:  ALEVE     TAKE these medications   acetaminophen 500 MG tablet Commonly known as:   TYLENOL Take 1,000 mg by mouth every 6 (six) hours as needed (for pain).   aspirin EC 81 MG tablet Take 81 mg by mouth daily.   blood glucose meter kit and supplies Dispense based on patient and insurance preference. Use up to four times daily as directed. (FOR ICD-9 250.00, 250.01).   buPROPion 150 MG 12 hr tablet Commonly known as:  ZYBAN Take 1 tablet (150 mg total) by mouth 2 (two) times daily.   cephALEXin 500 MG capsule Commonly known as:  KEFLEX Take 1 capsule (500 mg total) by mouth 3 (three) times daily.   dexamethasone 1 MG tablet Commonly known as:  DECADRON 1 tab PO at 11 p.m night before lab test.   diphenhydrAMINE 25 MG tablet Commonly known as:  BENADRYL Take 25 mg by mouth every 6 (six) hours as needed for allergies.   docusate sodium 100 MG capsule Commonly known as:  COLACE Take 100 mg by mouth daily.   ferrous sulfate 325 (65 FE) MG tablet Take 1 tablet (325 mg total) by mouth daily with breakfast.   furosemide 40 MG tablet Commonly known as:  LASIX Take 1 tablet (40 mg total) by mouth daily.   gabapentin 300 MG capsule Commonly known as:  NEURONTIN Take 2 capsules (600 mg total) by mouth 2 (two) times daily.   glucose blood test strip Use as instructed   glucose blood test strip Use as instructed   insulin aspart 100 UNIT/ML FlexPen Commonly known as:  NOVOLOG 5-15 units subcut TID with meals What changed:    how much to take  how to take this  when to take this  additional instructions   Insulin Glargine 100 UNIT/ML Solostar Pen Commonly known as:  LANTUS Inject 70 Units into the skin daily. What changed:  when to take this   Insulin Pen Needle 31G X 5 MM Misc Use with Lantus and Novolog injections.   Insulin Syringe-Needle U-100 31G X 5/16" 0.5 ML Misc Use as directed   liraglutide 18 MG/3ML Sopn Commonly known as:  VICTOZA Inject 0.1 mLs (0.6 mg total) into the skin daily.   megestrol 40 MG tablet Commonly known as:   MEGACE Take 2 tablets (80 mg total) by mouth daily.  nicotine 14 mg/24hr patch Commonly known as:  NICODERM CQ - dosed in mg/24 hours Place 1 patch (14 mg total) onto the skin daily.   omeprazole 20 MG capsule Commonly known as:  PRILOSEC Take 2 capsules (40 mg total) by mouth daily. What changed:    when to take this  reasons to take this   ondansetron 4 MG tablet Commonly known as:  ZOFRAN Take 1 tablet (4 mg total) by mouth every 8 (eight) hours as needed for nausea or vomiting.   oxybutynin 5 MG tablet Commonly known as:  DITROPAN Take 1 tablet (5 mg total) by mouth every 8 (eight) hours as needed for bladder spasms.   phenazopyridine 200 MG tablet Commonly known as:  PYRIDIUM Take 1 tablet (200 mg total) by mouth 3 (three) times daily as needed (for pain with urination).   polyethylene glycol packet Commonly known as:  MIRALAX / GLYCOLAX Take 17 g by mouth daily.   PROAIR HFA 108 (90 Base) MCG/ACT inhaler Generic drug:  albuterol Inhale 2 puffs into the lungs every 6 (six) hours as needed for wheezing or shortness of breath.   sodium polystyrene 15 GM/60ML suspension Commonly known as:  KAYEXALATE Orally 30 gm daily dispense 1 month supply   traMADol 50 MG tablet Commonly known as:  ULTRAM Take 1 tablet (50 mg total) by mouth daily as needed (for pain).   TRUE METRIX METER w/Device Kit Use as directed   TRUEPLUS LANCETS 28G Misc Use as directed   warfarin 5 MG tablet Commonly known as:  COUMADIN Take as directed. If you are unsure how to take this medication, talk to your nurse or doctor. Original instructions:  Take 1-2 tablets (5-10 mg total) by mouth See admin instructions. 5 mg Sun, Mon , wed,  Fri 10 mg true, Thurs sat What changed:  how much to take       Follow-up Information    Winter, Conception Oms, MD In 2 weeks.   Specialty:  Urology Contact information: [place] [address] [phone]         Ladell Pier, MD. Go in 2 day(s).   Specialty:  Internal Medicine Why:  CBC, BMP and magnesium levels checked.  Also discussed your diabetic control with her. 07/12/2018 at 2:50 pm Contact information: [place] [address] [phone]        Justin Mend, MD. Schedule an appointment as soon as possible for a visit in 4 day(s).   Specialty:  Internal Medicine Why:  High potassium Contact information: [place] [address] [phone]           Major procedures and Radiology Reports - PLEASE review detailed and final reports thoroughly  -     9/24>>Cystoscopy with right JJ stent placement   Dg Chest 2 View  Result Date: 06/26/2018 CLINICAL DATA:  shortness of breath, n/v and rt flank pain for two days, weak,hx hematuria EXAM: CHEST - 2 VIEW COMPARISON:  None. FINDINGS: The heart size and mediastinal contours are within normal limits. Both lungs are clear. The visualized skeletal structures are unremarkable. IMPRESSION: No active cardiopulmonary disease. Electronically Signed   By: Nolon Nations M.D.   On: 06/26/2018 13:38   Dg Retrograde Pyelogram  Result Date: 06/28/2018 CLINICAL DATA:  Right-sided ureteral stent placement EXAM: RETROGRADE PYELOGRAM COMPARISON:  CT abdomen pelvis-05/12/2018 FLUOROSCOPY TIME:  19 seconds FINDINGS: A single spot fluoroscopic image of the right mid/lower abdomen is  provided for review Provided image demonstrates the superior aspect of a right-sided ureteral stent with superior coil overlying expected location the right renal fossa. The inferior aspect of the stent was not imaged. IMPRESSION: Post right-sided ureteral stent placement. Correlation with the operative report is advised. Electronically Signed   By: Sandi Mariscal M.D.   On: 06/28/2018 07:39   Ct Angio Chest/abd/pel For Dissection W And/or Wo Contrast  Result Date: 06/26/2018 CLINICAL DATA:  42 year old with shortness of breath. Evaluate for  aortic dissection. EXAM: CT ANGIOGRAPHY CHEST, ABDOMEN AND PELVIS TECHNIQUE: Multidetector CT imaging through the chest, abdomen and pelvis was performed using the standard protocol during bolus administration of intravenous contrast. Multiplanar reconstructed images and MIPs were obtained and reviewed to evaluate the vascular anatomy. CONTRAST:  148m ISOVUE-370 IOPAMIDOL (ISOVUE-370) INJECTION 76% COMPARISON:  05/12/2018 and chest CT 08/26/2014 FINDINGS: CTA CHEST FINDINGS Cardiovascular: Normal caliber of the thoracic aorta without dissection or intramural hematoma. Heart size is normal without significant pericardial fluid. Pulmonary arteries are not opacified with contrast. Typical aortic arch anatomy. Visualized great vessels are patent. Mediastinum/Nodes: Mediastinal structures are unremarkable. No evidence for mediastinal, hilar or axillary lymphadenopathy. Lungs/Pleura: Trachea and mainstem bronchi are patent. No large pleural effusions. Atelectasis in the right lower lobe. There are 2 stable nodular densities along the right major fissure since 2015. The largest measures 7 mm on sequence 11, image 62. These are compatible with benign findings based on the stability. Stable pleural-based nodularity along the anterior right lung on sequence 11, image 75. Nodular density along the left side of the heart on sequence 11, image 90 is probably unchanged since 2015. No large areas of airspace disease or lung consolidation. Musculoskeletal: No acute bone abnormality. Review of the MIP images confirms the above findings. CTA ABDOMEN AND PELVIS FINDINGS VASCULAR Aorta: Abdominal aorta is patent without dissection. Limited evaluation of the aorta due to poor contrast opacification and body habitus. Celiac: Celiac trunk and main branch vessels are patent. SMA: SMA is patent but limited evaluation. Renals: Flow in bilateral renal arteries but limited evaluation. IMA: Limited evaluation. Inflow: Again noted is a stent  left common iliac artery. Left common iliac artery stent appears to be patent. There appears to be flow in the left external iliac artery. Limited evaluation of the right common iliac artery. There is flow in the right external iliac artery. Surgical clips of the left groin. Flow in the proximal bilateral femoral arteries but limited evaluation. Veins: No obvious venous abnormality within the limitations of this arterial phase study. Review of the MIP images confirms the above findings. NON-VASCULAR Hepatobiliary: Distention of the gallbladder. No gross abnormality to the liver but limited evaluation due to streak artifact. Pancreas: Unremarkable. No pancreatic ductal dilatation or surrounding inflammatory changes. Spleen: Normal in size without focal abnormality. Adrenals/Urinary Tract: Normal appearance of the right adrenal gland. There is stable fullness and nodularity in the left adrenal gland measuring up to 1.7 cm and compatible with an adenoma based on exam from 08/17/2015. There is new right perinephric edema. There is a new gas in the bilateral renal collecting systems. Decreased enhancement scattered throughout both kidneys, particularly the right kidney. Findings are suggestive for pyelonephritis. Focal pyelonephritis in the left kidney upper pole. There are several small bilateral renal calculi. There is extensive cortical thinning and scarring in the left kidney. There is now a small stone in the proximal right ureter measuring roughly 6 mm. There is new mild right hydronephrosis. One or two small stones in  the right renal pelvis. Minimal dilatation of left renal pelvis. Stomach/Bowel: No acute inflammation involving the bowel structures. Stomach is unremarkable. Lymphatic: No significant lymph node enlargement. Reproductive: Stable appearance of the uterus and adnexal structures. Right adnexa is mildly prominent but poorly characterized on this examination. Other: Trace fluid in the pelvis.  Negative  for free air. Musculoskeletal: No acute abnormality. Review of the MIP images confirms the above findings. IMPRESSION: 1. Bilateral pyelonephritis with emphysematous pyelitis. Gas within the bilateral renal collecting systems and urinary bladder. 2. Bilateral renal calculi with 6 mm stone in the proximal right ureter. Mild right hydronephrosis. 3. Negative for an aortic dissection. Limited evaluation of the abdominopelvic vascular structures. The left common iliac artery stent appears to be patent. 4. Extensive scarring in the left kidney. These results were called by telephone at the time of interpretation on 06/26/2018 at 7:26 pm to Dr. Tomi Bamberger , who verbally acknowledged these results. Electronically Signed   By: Markus Daft M.D.   On: 06/26/2018 19:26    Micro Results     Recent Results (from the past 240 hour(s))  Blood Culture (routine x 2)     Status: Abnormal   Collection Time: 06/26/18  4:21 PM  Result Value Ref Range Status   Specimen Description BLOOD SITE NOT SPECIFIED  Final   Special Requests   Final    BOTTLES DRAWN AEROBIC AND ANAEROBIC Blood Culture adequate volume   Culture  Setup Time   Final    GRAM NEGATIVE RODS IN BOTH AEROBIC AND ANAEROBIC BOTTLES CRITICAL RESULT CALLED TO, READ BACK BY AND VERIFIED WITH: Hughie Closs PharmD 13:20 06/27/18 ([email or web address]) Performed at [facility], [place] [address], [place], [place] [address]    Culture ESCHERICHIA COLI (A)  Final   Report Status 06/29/2018 FINAL  Final   Organism ID, Bacteria ESCHERICHIA COLI  Final      Susceptibility   Escherichia coli - MIC*    AMPICILLIN 8 SENSITIVE Sensitive     CEFAZOLIN <=4 SENSITIVE Sensitive     CEFEPIME <=1 SENSITIVE Sensitive     CEFTAZIDIME <=1 SENSITIVE Sensitive     CEFTRIAXONE <=1 SENSITIVE Sensitive     CIPROFLOXACIN <=0.25 SENSITIVE Sensitive     GENTAMICIN <=1 SENSITIVE Sensitive     IMIPENEM <=0.25 SENSITIVE Sensitive     TRIMETH/SULFA <=20 SENSITIVE Sensitive     AMPICILLIN/SULBACTAM  4 SENSITIVE Sensitive     PIP/TAZO <=4 SENSITIVE Sensitive     Extended ESBL NEGATIVE Sensitive     * ESCHERICHIA COLI  Blood Culture ID Panel (Reflexed)     Status: Abnormal   Collection Time: 06/26/18  4:21 PM  Result Value Ref Range Status   Enterococcus species NOT DETECTED NOT DETECTED Final   Listeria monocytogenes NOT DETECTED NOT DETECTED Final   Staphylococcus species NOT DETECTED NOT DETECTED Final   Staphylococcus aureus NOT DETECTED NOT DETECTED Final   Streptococcus species NOT DETECTED NOT DETECTED Final   Streptococcus agalactiae NOT DETECTED NOT DETECTED Final   Streptococcus pneumoniae NOT DETECTED NOT DETECTED Final   Streptococcus pyogenes NOT DETECTED NOT DETECTED Final   Acinetobacter baumannii NOT DETECTED NOT DETECTED Final   Enterobacteriaceae species DETECTED (A) NOT DETECTED Final    Comment: Enterobacteriaceae represent a large family of gram-negative bacteria, not a single organism. CRITICAL RESULT CALLED TO, READ BACK BY AND VERIFIED WITH: Hughie Closs PharmD 13:20 06/27/18 ([email or web address])    Enterobacter cloacae complex NOT DETECTED NOT DETECTED Final   Escherichia coli DETECTED (  A) NOT DETECTED Final    Comment: CRITICAL RESULT CALLED TO, READ BACK BY AND VERIFIED WITH: Hughie Closs PharmD 13:20 06/27/18 ([email or web address])    Klebsiella oxytoca NOT DETECTED NOT DETECTED Final   Klebsiella pneumoniae NOT DETECTED NOT DETECTED Final   Proteus species NOT DETECTED NOT DETECTED Final   Serratia marcescens NOT DETECTED NOT DETECTED Final   Carbapenem resistance NOT DETECTED NOT DETECTED Final   Haemophilus influenzae NOT DETECTED NOT DETECTED Final   Neisseria meningitidis NOT DETECTED NOT DETECTED Final   Pseudomonas aeruginosa NOT DETECTED NOT DETECTED Final   Candida albicans NOT DETECTED NOT DETECTED Final   Candida glabrata NOT DETECTED NOT DETECTED Final   Candida krusei NOT DETECTED NOT DETECTED Final   Candida parapsilosis NOT DETECTED NOT DETECTED Final   Candida  tropicalis NOT DETECTED NOT DETECTED Final  Blood Culture (routine x 2)     Status: None   Collection Time: 06/26/18  5:52 PM  Result Value Ref Range Status   Specimen Description BLOOD LEFT ARM  Final   Special Requests   Final    BOTTLES DRAWN AEROBIC AND ANAEROBIC Blood Culture adequate volume   Culture   Final    NO GROWTH 5 DAYS Performed at [facility], [place] [address], [place], [place] [address]    Report Status 07/01/2018 FINAL  Final  MRSA PCR Screening     Status: None   Collection Time: 06/27/18  9:25 AM  Result Value Ref Range Status   MRSA by PCR NEGATIVE NEGATIVE Final    Comment:        The GeneXpert MRSA Assay (FDA approved for NASAL specimens only), is one component of a comprehensive MRSA colonization surveillance program. It is not intended to diagnose MRSA infection nor to guide or monitor treatment for MRSA infections. Performed at [facility], [place] [address] [place], [place] [address]   Surgical PCR screen     Status: None   Collection Time: 06/27/18  3:34 PM  Result Value Ref Range Status   MRSA, PCR NEGATIVE NEGATIVE Final   Staphylococcus aureus NEGATIVE NEGATIVE Final    Comment: (NOTE) The Xpert SA Assay (FDA approved for NASAL specimens in patients 80 years of age and older), is one component of a comprehensive surveillance program. It is not intended to diagnose infection nor to guide or monitor treatment. Performed at [facility], [place] [address], [place], [place] [address]   Urine Culture     Status: Abnormal   Collection Time: 06/28/18 12:01 AM  Result Value Ref Range Status   Specimen Description URINE, RANDOM  Final   Special Requests NONE  Final   Culture (A)  Final    <10,000 COLONIES/mL INSIGNIFICANT GROWTH Performed at [facility], [place] [address], [place], [place] [address]    Report Status 06/30/2018 FINAL  Final    Today   Subjective    Kassie Wickes today has no headache,no chest abdominal  pain,no new weakness tingling or numbness, feels much better wants to go home today.     Objective   Blood pressure (!) 115/52, pulse 90, temperature 99 F (37.2 C), temperature source Oral, resp. rate 16, height _0  (1.702 m), weight (!) 138.8 kg, SpO2 99 %.   Intake/Output Summary (Last 24 hours) at 07/05/2018 1016 Last data filed at 07/04/2018 1828 Gross per 24 hour  Intake 361.87 ml  Output -  Net 361.87 ml    Exam Awake Alert, Oriented  x 3, No new F.N deficits, Normal affect [place].AT,PERRAL Supple Neck,No JVD, No cervical lymphadenopathy appriciated.  Symmetrical Chest wall movement, Good air movement bilaterally, CTAB RRR,No Gallops,Rubs or new Murmurs, No Parasternal Heave +ve B.Sounds, Abd Soft, Non tender, No organomegaly appriciated, No rebound -guarding or rigidity. No Cyanosis, Clubbing or edema, No new Rash or bruise   Data Review   CBC w Diff:  Lab Results  Component Value Date   WBC 11.5 (H) 06/29/2018   HGB 8.0 (L) 06/29/2018   HGB 11.0 (L) 01/27/2018   HCT 24.8 (L) 06/29/2018   HCT 37.6 01/27/2018   PLT 225 06/29/2018   PLT 490 (H) 01/27/2018   LYMPHOPCT 6 06/27/2018   MONOPCT 4 06/27/2018   EOSPCT 1 06/27/2018   BASOPCT 1 06/27/2018    CMP:  Lab Results  Component Value Date   NA 132 (L) 07/05/2018   NA 134 01/27/2018   K 5.3 (H) 07/05/2018   CL 92 (L) 07/05/2018   CO2 29 07/05/2018   BUN 30 (H) 07/05/2018   BUN 14 01/27/2018   CREATININE 2.79 (H) 07/05/2018   PROT 5.7 (L) 06/27/2018   PROT 6.8 01/27/2018   ALBUMIN 2.2 (L) 06/27/2018   ALBUMIN 4.0 01/27/2018   BILITOT 0.6 06/27/2018   BILITOT <0.2 01/27/2018   ALKPHOS 74 06/27/2018   AST 15 06/27/2018   ALT 12 06/27/2018  .   Total Time in preparing paper work, data evaluation and todays exam - 8 minutes  Lala Lund M.D on 07/05/2018 at 10:16 AM  [facility]   Office  [phone]

## 2018-07-03 ENCOUNTER — Encounter: Payer: PRIVATE HEALTH INSURANCE | Admitting: Pharmacist

## 2018-07-03 LAB — BASIC METABOLIC PANEL
Anion gap: 12 (ref 5–15)
BUN: 37 mg/dL — ABNORMAL HIGH (ref 6–20)
CO2: 24 mmol/L (ref 22–32)
Calcium: 9.3 mg/dL (ref 8.9–10.3)
Chloride: 94 mmol/L — ABNORMAL LOW (ref 98–111)
Creatinine, Ser: 3.21 mg/dL — ABNORMAL HIGH (ref 0.44–1.00)
GFR calc Af Amer: 19 mL/min — ABNORMAL LOW (ref >60)
GFR calc non Af Amer: 17 mL/min — ABNORMAL LOW (ref >60)
Glucose, Bld: 175 mg/dL — ABNORMAL HIGH (ref 70–99)
Potassium: 5.8 mmol/L — ABNORMAL HIGH (ref 3.5–5.1)
Sodium: 130 mmol/L — ABNORMAL LOW (ref 135–145)

## 2018-07-03 LAB — GLUCOSE, CAPILLARY
Glucose-Capillary: 111 mg/dL — ABNORMAL HIGH (ref 70–99)
Glucose-Capillary: 139 mg/dL — ABNORMAL HIGH (ref 70–99)
Glucose-Capillary: 141 mg/dL — ABNORMAL HIGH (ref 70–99)
Glucose-Capillary: 145 mg/dL — ABNORMAL HIGH (ref 70–99)
Glucose-Capillary: 154 mg/dL — ABNORMAL HIGH (ref 70–99)
Glucose-Capillary: 160 mg/dL — ABNORMAL HIGH (ref 70–99)
Glucose-Capillary: 193 mg/dL — ABNORMAL HIGH (ref 70–99)
Glucose-Capillary: 241 mg/dL — ABNORMAL HIGH (ref 70–99)

## 2018-07-03 LAB — POTASSIUM: Potassium: 5.7 mmol/L — ABNORMAL HIGH (ref 3.5–5.1)

## 2018-07-03 LAB — PROTIME-INR
INR: 3.57
Prothrombin Time: 35.4 seconds — ABNORMAL HIGH (ref 11.4–15.2)

## 2018-07-03 MED ORDER — FUROSEMIDE 10 MG/ML IJ SOLN
40.0000 mg | Freq: Once | INTRAMUSCULAR | Status: AC
  Administered 2018-07-03: 40 mg via INTRAVENOUS
  Filled 2018-07-03: qty 4

## 2018-07-03 MED ORDER — SODIUM POLYSTYRENE SULFONATE 15 GM/60ML PO SUSP
30.0000 g | Freq: Once | ORAL | Status: AC
  Administered 2018-07-03: 30 g via ORAL
  Filled 2018-07-03: qty 120

## 2018-07-03 MED ORDER — PATIROMER SORBITEX CALCIUM 8.4 G PO PACK
8.4000 g | PACK | Freq: Every day | ORAL | Status: DC
  Administered 2018-07-03 – 2018-07-04 (×2): 8.4 g via ORAL
  Filled 2018-07-03 (×2): qty 1

## 2018-07-03 MED ORDER — CHLORHEXIDINE GLUCONATE 0.12 % MT SOLN
15.0000 mL | Freq: Two times a day (BID) | OROMUCOSAL | Status: DC
  Administered 2018-07-03 – 2018-07-05 (×4): 15 mL via OROMUCOSAL
  Filled 2018-07-03 (×3): qty 15

## 2018-07-03 MED FILL — traMADol HCL 50 MG TABS: 50 | 15 days supply | Qty: 15 | Fill #0

## 2018-07-03 MED FILL — FUROSEMIDE 40 MG TABLET: 40 | 10 days supply | Qty: 10 | Fill #0

## 2018-07-03 MED FILL — POLYETHYLENE GLYCOL 3350 PO: 15 days supply | Qty: 238 | Fill #0

## 2018-07-03 MED FILL — CEPHALEXIN 500 MG CAPSULE: 500 | 21 days supply | Qty: 21 | Fill #0

## 2018-07-03 NOTE — Progress Notes (Addendum)
PROGRESS NOTE        PATIENT DETAILS Name: Terri Sparks Age: 42 y.o. Sex: female Date of Birth: 10/07/1975 Admit Date: 06/26/2018 Admitting Physician Vianne Bulls, MD VPX:[id number], Dalbert Batman, MD  Brief Narrative: Patient is a 42 y.o. 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 coli bacteremia.  CT imaging also showed a 6 mm proximal stone in the right ureter with mild right hydronephrosis.  Urology and IR both were consulted, she subsequently underwent cystoscopy with right stent placement on 9/24.  1 out of 2 blood cultures were subsequently positive for gram-negative rods.  Slowly improving with supportive care-see below for further details.  Subjective:  Patient in bed, appears comfortable, denies any headache, no fever, no chest pain or pressure, no shortness of breath , no abdominal pain. No focal weakness.  She has been discharged yesterday however potassium persistently high, will hold discharge till tomorrow.   Assessment/Plan: Sepsis secondary to bilateral pyelonephritis and E. coli bacteremia: Sepsis physiology has resolved, 1/2 blood cultures positive for pansensitive E. coli-remains on IV Rocephin.  Sepsis physiology seems to have resolved, will switch to oral Keflex today, advance activity, PT eval, if stable will discharge home on 10 more days of oral Keflex.  Right ureteral stone with minimal right-sided hydronephrosis: Underwent cystoscopy with right JJ stent placement on 9/24-renal function slowly improving.  Urology consulted but has subsequently signed out.  Post discharge we will have her follow with urology within a week.  Acute  kidney injury on CKD stage III: AKI multifactorial-hemodynamically mediated in the setting of sepsis with some contribution from obstructive uropathy.  Niccoli improving, repeat BMP in the morning.  Non-anion gap metabolic acidosis: Due to ARF and sepsis has resolved.  Hyponatremia: Thought to be due to volume overload, check urine sodium, repeat BMP in the morning.  No edema or rales today.  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  Hypertension: Controlled-continue to hold all antihypertensives.  Insulin-dependent DM-2: CBG stable-continue Lantus 30 units nightly and SSI.  Follow and adjust accordingly.  CBG stable-continue Lantus 30 units nightly and SSI.   CBG (last 3)  Recent Labs    07/03/18 0411 07/03/18 0728 07/03/18 1223  GLUCAP 141* 160* 154*    GERD: Continue PPI  History of recurrent thromboembolism causing ischemic left leg x2-required thrombectomy and angioplasty to left tibial artery August 2019: Currently on Lovenox and Coumadin overlap pharmacy monitoring.  Lab Results  Component Value Date   INR 3.57 07/03/2018   INR 3.73 07/02/2018   INR 3.54 07/01/2018     DVT Prophylaxis: Lovenox with overlapping Coumadin  Code Status: Full code   Family Communication: None at bedside  Disposition Plan: Discharge in the morning.  Antimicrobial agents: Anti-infectives (From admission, onward)   Start     Dose/Rate Route Frequency Ordered Stop   07/02/18 1400  cephALEXin (KEFLEX) capsule 500 mg  500 mg Oral Every 8 hours 07/02/18 1100     07/02/18 1000  cephALEXin (KEFLEX) capsule 500 mg  Status:  Discontinued     500 mg Oral Every 12 hours 07/01/18 1155 07/02/18 1100   07/02/18 0000  cephALEXin (KEFLEX) 500 MG  capsule     500 mg Oral 3 times daily 07/02/18 0947     06/28/18 1900  vancomycin (VANCOCIN) 1,750 mg in sodium chloride 0.9 % 500 mL IVPB  Status:  Discontinued     1,750 mg 250 mL/hr over 120 Minutes Intravenous Every 48 hours 06/26/18 2022 06/27/18 1319   06/27/18 1800  ceFEPIme (MAXIPIME) 500 mg in dextrose 5 % 50 mL IVPB  Status:  Discontinued     500 mg 100 mL/hr over 30 Minutes Intravenous Every 24 hours 06/26/18 2022 06/27/18 0852   06/27/18 1800  ceFEPIme (MAXIPIME) 1 g in sodium chloride 0.9 % 100 mL IVPB  Status:  Discontinued     1 g 200 mL/hr over 30 Minutes Intravenous Every 24 hours 06/27/18 0852 06/27/18 1319   06/27/18 1500  cefTRIAXone (ROCEPHIN) 2 g in sodium chloride 0.9 % 100 mL IVPB     2 g 200 mL/hr over 30 Minutes Intravenous Every 24 hours 06/27/18 1319 07/01/18 1704   06/26/18 1700  vancomycin (VANCOCIN) 2,000 mg in sodium chloride 0.9 % 500 mL IVPB     2,000 mg 250 mL/hr over 120 Minutes Intravenous  Once 06/26/18 1626 06/26/18 2141   06/26/18 1700  ceFEPIme (MAXIPIME) 2 g in sodium chloride 0.9 % 100 mL IVPB     2 g 200 mL/hr over 30 Minutes Intravenous  Once 06/26/18 1654 06/26/18 1838   06/26/18 1600  vancomycin (VANCOCIN) IVPB 1000 mg/200 mL premix  Status:  Discontinued     1,000 mg 200 mL/hr over 60 Minutes Intravenous  Once 06/26/18 1559 06/26/18 1626      Procedures: 9/24>>Cystoscopy with right JJ stent placement  CONSULTS:  urology  Time spent: 25 minutes-Greater than 50% of this time was spent in counseling, explanation of diagnosis, planning of further management, and coordination of care.  MEDICATIONS: Scheduled Meds: . bisacodyl  10 mg Rectal Daily  . buPROPion  150 mg Oral BID  . cephALEXin  500 mg Oral Q8H  . furosemide  40 mg Intravenous Once  . gabapentin  200 mg Oral BID  . insulin aspart  0-9 Units Subcutaneous Q4H  . insulin glargine  30 Units Subcutaneous QHS  . megestrol  80 mg Oral Daily  . nystatin   Topical QID  .  pantoprazole  40 mg Oral Daily  . patiromer  8.4 g Oral Daily  . polyethylene glycol  17 g Oral BID  . senna  2 tablet Oral Daily  . sodium chloride flush  3 mL Intravenous Q12H  . Warfarin - Pharmacist Dosing Inpatient   Does not apply q1800   Continuous Infusions: . sodium chloride    . sodium chloride     PRN Meds:.acetaminophen **OR** [DISCONTINUED] acetaminophen, albuterol, ALPRAZolam, diphenhydrAMINE, hydrOXYzine, magic mouthwash w/lidocaine, morphine injection, [DISCONTINUED] ondansetron **OR** ondansetron (ZOFRAN) IV, oxyCODONE-acetaminophen, phenol, sodium chloride   PHYSICAL EXAM: Vital signs: Vitals:   07/02/18 2054 07/02/18 2300 07/03/18 0413 07/03/18 1235  BP: 120/66  (!) 108/51 (!) 102/54  Pulse: 85  94 99  Resp: 17  16 18   Temp: 98.5 F (36.9 C)  98 F (36.7 C) 98.3 F (36.8 C)  TempSrc: Oral  Oral Oral  SpO2: 96%  98%  98%  Weight:  135.9 kg    Height:       Filed Weights   07/01/18 0438 07/02/18 0500 07/02/18 2300  Weight: (!) 140.3 kg (!) 138.8 kg 135.9 kg   Body mass index is 46.91 kg/m.   Awake Alert, Oriented X 3, No new F.N deficits, Normal affect [place].AT,PERRAL Supple Neck,No JVD, No cervical lymphadenopathy appriciated.  Symmetrical Chest wall movement, Good air movement bilaterally, CTAB RRR,No Gallops, Rubs or new Murmurs, No Parasternal Heave +ve B.Sounds, Abd Soft, No tenderness, No organomegaly appriciated, No rebound - guarding or rigidity. No Cyanosis, Clubbing , 2+ edema, No new Rash or bruise   I have personally reviewed following labs and imaging studies  LABORATORY DATA: CBC: Recent Labs  Lab 06/26/18 1629 06/27/18 0126 06/28/18 0451 06/29/18 0455  WBC  --  15.9* 13.4* 11.5*  NEUTROABS  --  13.9*  --   --   HGB 10.9* 9.1* 8.3* 8.0*  HCT 32.0* 28.7* 27.2* 24.8*  MCV  --  90.5 93.5 88.9  PLT  --  269 236 245    Basic Metabolic Panel: Recent Labs  Lab 06/28/18 0451 06/29/18 0455 06/30/18 0446 07/01/18 0341  07/02/18 0608 07/02/18 1036 07/03/18 0525 07/03/18 1239  NA 129* 129* 133* 131* 133*  --   --  130*  K 4.5 3.9 4.9 4.6 5.5* 5.6* 5.7* 5.8*  CL 102 100 100 97* 100  --   --  94*  CO2 14* 19* 22 23 23   --   --  24  GLUCOSE 168* 187* 108* 122* 130*  --   --  175*  BUN 59* 55* 53* 48* 42*  --   --  37*  CREATININE 5.31* 4.94* 4.80* 4.23* 3.65*  --   --  3.21*  CALCIUM 8.0* 8.2* 8.8* 9.0 9.2  --   --  9.3  MG 1.5*  --   --   --   --   --   --   --     GFR: Estimated Creatinine Clearance: 32.6 mL/min (A) (by C-G formula based on SCr of 3.21 mg/dL (H)).  Liver Function Tests: Recent Labs  Lab 06/27/18 0126  AST 15  ALT 12  ALKPHOS 74  BILITOT 0.6  PROT 5.7*  ALBUMIN 2.2*   No results for input(s): LIPASE, AMYLASE in the last 168 hours. No results for input(s): AMMONIA in the last 168 hours.  Coagulation Profile: Recent Labs  Lab 06/30/18 0446 07/01/18 0341 07/01/18 1200 07/02/18 0608 07/03/18 0525  INR 1.88 3.19 3.54 3.73 3.57    Cardiac Enzymes: No results for input(s): CKTOTAL, CKMB, CKMBINDEX, TROPONINI in the last 168 hours.  BNP (last 3 results) No results for input(s): PROBNP in the last 8760 hours.  HbA1C: No results for input(s): HGBA1C in the last 72 hours.  CBG: Recent Labs  Lab 07/02/18 2051 07/02/18 2359 07/03/18 0411 07/03/18 0728 07/03/18 1223  GLUCAP 200* 132* 141* 160* 154*    Lipid Profile: No results for input(s): CHOL, HDL, LDLCALC, TRIG, CHOLHDL, LDLDIRECT in the last 72 hours.  Thyroid Function Tests: No results for input(s): TSH, T4TOTAL, FREET4, T3FREE, THYROIDAB in the last 72 hours.  Anemia Panel: No results for input(s): VITAMINB12, FOLATE, FERRITIN, TIBC, IRON, RETICCTPCT in the last 72 hours.  Urine analysis:    Component Value Date/Time   COLORURINE RED (A) 06/26/2018 1317   APPEARANCEUR TURBID (A) 06/26/2018 1317   LABSPEC  06/26/2018 1317    TEST NOT REPORTED DUE TO  COLOR INTERFERENCE OF URINE PIGMENT   PHURINE   06/26/2018 1317    TEST NOT REPORTED DUE TO COLOR INTERFERENCE OF URINE PIGMENT   GLUCOSEU (A) 06/26/2018 1317    TEST NOT REPORTED DUE TO COLOR INTERFERENCE OF URINE PIGMENT   HGBUR (A) 06/26/2018 1317    TEST NOT REPORTED DUE TO COLOR INTERFERENCE OF URINE PIGMENT   BILIRUBINUR (A) 06/26/2018 1317    TEST NOT REPORTED DUE TO COLOR INTERFERENCE OF URINE PIGMENT   BILIRUBINUR negative 09/29/2017 1317   KETONESUR (A) 06/26/2018 1317    TEST NOT REPORTED DUE TO COLOR INTERFERENCE OF URINE PIGMENT   PROTEINUR (A) 06/26/2018 1317    TEST NOT REPORTED DUE TO COLOR INTERFERENCE OF URINE PIGMENT   UROBILINOGEN 0.2 06/15/2018 1530   NITRITE (A) 06/26/2018 1317    TEST NOT REPORTED DUE TO COLOR INTERFERENCE OF URINE PIGMENT   LEUKOCYTESUR (A) 06/26/2018 1317    TEST NOT REPORTED DUE TO COLOR INTERFERENCE OF URINE PIGMENT    Sepsis Labs: Lactic Acid, Venous    Component Value Date/Time   LATICACIDVEN 2.5 ([place]) 06/27/2018 0126    MICROBIOLOGY: Recent Results (from the past 240 hour(s))  Blood Culture (routine x 2)     Status: Abnormal   Collection Time: 06/26/18  4:21 PM  Result Value Ref Range Status   Specimen Description BLOOD SITE NOT SPECIFIED  Final   Special Requests   Final    BOTTLES DRAWN AEROBIC AND ANAEROBIC Blood Culture adequate volume   Culture  Setup Time   Final    GRAM NEGATIVE RODS IN BOTH AEROBIC AND ANAEROBIC BOTTLES CRITICAL RESULT CALLED TO, READ BACK BY AND VERIFIED WITH: Hughie Closs PharmD 13:20 06/27/18 ([email or web address]) Performed at [facility], [place] [address] [place], [place] [address]    Culture ESCHERICHIA COLI (A)  Final   Report Status 06/29/2018 FINAL  Final   Organism ID, Bacteria ESCHERICHIA COLI  Final      Susceptibility   Escherichia coli - MIC*    AMPICILLIN 8 SENSITIVE Sensitive     CEFAZOLIN <=4 SENSITIVE Sensitive     CEFEPIME <=1 SENSITIVE Sensitive     CEFTAZIDIME <=1 SENSITIVE Sensitive     CEFTRIAXONE <=1 SENSITIVE Sensitive      CIPROFLOXACIN <=0.25 SENSITIVE Sensitive     GENTAMICIN <=1 SENSITIVE Sensitive     IMIPENEM <=0.25 SENSITIVE Sensitive     TRIMETH/SULFA <=20 SENSITIVE Sensitive     AMPICILLIN/SULBACTAM 4 SENSITIVE Sensitive     PIP/TAZO <=4 SENSITIVE Sensitive     Extended ESBL NEGATIVE Sensitive     * ESCHERICHIA COLI  Blood Culture ID Panel (Reflexed)     Status: Abnormal   Collection Time: 06/26/18  4:21 PM  Result Value Ref Range Status   Enterococcus species NOT DETECTED NOT DETECTED Final   Listeria monocytogenes NOT DETECTED NOT DETECTED Final   Staphylococcus species NOT DETECTED NOT DETECTED Final   Staphylococcus aureus NOT DETECTED NOT DETECTED Final   Streptococcus species NOT DETECTED NOT DETECTED Final   Streptococcus agalactiae NOT DETECTED NOT DETECTED Final   Streptococcus pneumoniae NOT DETECTED NOT DETECTED Final   Streptococcus pyogenes NOT DETECTED NOT DETECTED Final   Acinetobacter baumannii NOT DETECTED NOT DETECTED Final   Enterobacteriaceae species DETECTED (A) NOT DETECTED Final    Comment: Enterobacteriaceae represent a large family of gram-negative bacteria, not a single organism. CRITICAL RESULT CALLED TO, READ BACK BY AND VERIFIED WITH: Hughie Closs PharmD 13:20 06/27/18 ([email or web address])    Enterobacter  cloacae complex NOT DETECTED NOT DETECTED Final   Escherichia coli DETECTED (A) NOT DETECTED Final    Comment: CRITICAL RESULT CALLED TO, READ BACK BY AND VERIFIED WITH: Hughie Closs PharmD 13:20 06/27/18 ([email or web address])    Klebsiella oxytoca NOT DETECTED NOT DETECTED Final   Klebsiella pneumoniae NOT DETECTED NOT DETECTED Final   Proteus species NOT DETECTED NOT DETECTED Final   Serratia marcescens NOT DETECTED NOT DETECTED Final   Carbapenem resistance NOT DETECTED NOT DETECTED Final   Haemophilus influenzae NOT DETECTED NOT DETECTED Final   Neisseria meningitidis NOT DETECTED NOT DETECTED Final   Pseudomonas aeruginosa NOT DETECTED NOT DETECTED Final   Candida albicans NOT DETECTED  NOT DETECTED Final   Candida glabrata NOT DETECTED NOT DETECTED Final   Candida krusei NOT DETECTED NOT DETECTED Final   Candida parapsilosis NOT DETECTED NOT DETECTED Final   Candida tropicalis NOT DETECTED NOT DETECTED Final  Blood Culture (routine x 2)     Status: None   Collection Time: 06/26/18  5:52 PM  Result Value Ref Range Status   Specimen Description BLOOD LEFT ARM  Final   Special Requests   Final    BOTTLES DRAWN AEROBIC AND ANAEROBIC Blood Culture adequate volume   Culture   Final    NO GROWTH 5 DAYS Performed at [facility], [place] [address], [place], [place] [address]    Report Status 07/01/2018 FINAL  Final  MRSA PCR Screening     Status: None   Collection Time: 06/27/18  9:25 AM  Result Value Ref Range Status   MRSA by PCR NEGATIVE NEGATIVE Final    Comment:        The GeneXpert MRSA Assay (FDA approved for NASAL specimens only), is one component of a comprehensive MRSA colonization surveillance program. It is not intended to diagnose MRSA infection nor to guide or monitor treatment for MRSA infections. Performed at [facility], [place] [address] [place], [place] [address]   Surgical PCR screen     Status: None   Collection Time: 06/27/18  3:34 PM  Result Value Ref Range Status   MRSA, PCR NEGATIVE NEGATIVE Final   Staphylococcus aureus NEGATIVE NEGATIVE Final    Comment: (NOTE) The Xpert SA Assay (FDA approved for NASAL specimens in patients 62 years of age and older), is one component of a comprehensive surveillance program. It is not intended to diagnose infection nor to guide or monitor treatment. Performed at [facility], [place] [address], [place], [place] [address]   Urine Culture     Status: Abnormal   Collection Time: 06/28/18 12:01 AM  Result Value Ref Range Status   Specimen Description URINE, RANDOM  Final   Special Requests NONE  Final   Culture (A)  Final    <10,000 COLONIES/mL INSIGNIFICANT GROWTH Performed at [facility], [place] [address], [place], [place] [address]    Report Status 06/30/2018 FINAL  Final    RADIOLOGY STUDIES/RESULTS: Dg Chest 2 View  Result Date: 06/26/2018 CLINICAL DATA:  shortness of breath, n/v and rt flank pain for two days, weak,hx hematuria EXAM: CHEST - 2 VIEW COMPARISON:  None. FINDINGS: The heart size and mediastinal contours are within normal limits. Both lungs are clear. The visualized skeletal structures are unremarkable. IMPRESSION: No active cardiopulmonary disease. Electronically Signed   By: Nolon Nations M.D.   On: 06/26/2018 13:38   Dg Retrograde Pyelogram  Result Date: 06/28/2018 CLINICAL DATA:  Right-sided ureteral stent placement EXAM:  RETROGRADE PYELOGRAM COMPARISON:  CT abdomen pelvis-05/12/2018 FLUOROSCOPY TIME:  19 seconds FINDINGS: A single spot fluoroscopic image of the right mid/lower abdomen is provided for review Provided image demonstrates the superior aspect of a right-sided ureteral stent with superior coil overlying expected location the right renal fossa. The inferior aspect of the stent was not imaged. IMPRESSION: Post right-sided ureteral stent placement. Correlation with the operative report is advised. Electronically Signed   By: Sandi Mariscal M.D.   On: 06/28/2018 07:39   Ct Angio Chest/abd/pel For Dissection W And/or Wo Contrast  Result Date: 06/26/2018 CLINICAL DATA:  42 year old with shortness of breath. Evaluate for aortic dissection. EXAM: CT ANGIOGRAPHY CHEST, ABDOMEN AND PELVIS TECHNIQUE: Multidetector CT imaging through the chest, abdomen and pelvis was performed using the standard protocol during bolus administration of intravenous contrast. Multiplanar reconstructed images and MIPs were obtained and reviewed to evaluate the vascular anatomy. CONTRAST:  131mL ISOVUE-370 IOPAMIDOL (ISOVUE-370) INJECTION 76% COMPARISON:  05/12/2018 and chest CT 08/26/2014 FINDINGS: CTA CHEST FINDINGS Cardiovascular: Normal caliber of the thoracic aorta  without dissection or intramural hematoma. Heart size is normal without significant pericardial fluid. Pulmonary arteries are not opacified with contrast. Typical aortic arch anatomy. Visualized great vessels are patent. Mediastinum/Nodes: Mediastinal structures are unremarkable. No evidence for mediastinal, hilar or axillary lymphadenopathy. Lungs/Pleura: Trachea and mainstem bronchi are patent. No large pleural effusions. Atelectasis in the right lower lobe. There are 2 stable nodular densities along the right major fissure since 2015. The largest measures 7 mm on sequence 11, image 62. These are compatible with benign findings based on the stability. Stable pleural-based nodularity along the anterior right lung on sequence 11, image 75. Nodular density along the left side of the heart on sequence 11, image 90 is probably unchanged since 2015. No large areas of airspace disease or lung consolidation. Musculoskeletal: No acute bone abnormality. Review of the MIP images confirms the above findings. CTA ABDOMEN AND PELVIS FINDINGS VASCULAR Aorta: Abdominal aorta is patent without dissection. Limited evaluation of the aorta due to poor contrast opacification and body habitus. Celiac: Celiac trunk and main branch vessels are patent. SMA: SMA is patent but limited evaluation. Renals: Flow in bilateral renal arteries but limited evaluation. IMA: Limited evaluation. Inflow: Again noted is a stent left common iliac artery. Left common iliac artery stent appears to be patent. There appears to be flow in the left external iliac artery. Limited evaluation of the right common iliac artery. There is flow in the right external iliac artery. Surgical clips of the left groin. Flow in the proximal bilateral femoral arteries but limited evaluation. Veins: No obvious venous abnormality within the limitations of this arterial phase study. Review of the MIP images confirms the above findings. NON-VASCULAR Hepatobiliary: Distention of  the gallbladder. No gross abnormality to the liver but limited evaluation due to streak artifact. Pancreas: Unremarkable. No pancreatic ductal dilatation or surrounding inflammatory changes. Spleen: Normal in size without focal abnormality. Adrenals/Urinary Tract: Normal appearance of the right adrenal gland. There is stable fullness and nodularity in the left adrenal gland measuring up to 1.7 cm and compatible with an adenoma based on exam from 08/17/2015. There is new right perinephric edema. There is a new gas in the bilateral renal collecting systems. Decreased enhancement scattered throughout both kidneys, particularly the right kidney. Findings are suggestive for pyelonephritis. Focal pyelonephritis in the left kidney upper pole. There are several small bilateral renal calculi. There is extensive cortical thinning and scarring in the left kidney. There is now  a small stone in the proximal right ureter measuring roughly 6 mm. There is new mild right hydronephrosis. One or two small stones in the right renal pelvis. Minimal dilatation of left renal pelvis. Stomach/Bowel: No acute inflammation involving the bowel structures. Stomach is unremarkable. Lymphatic: No significant lymph node enlargement. Reproductive: Stable appearance of the uterus and adnexal structures. Right adnexa is mildly prominent but poorly characterized on this examination. Other: Trace fluid in the pelvis.  Negative for free air. Musculoskeletal: No acute abnormality. Review of the MIP images confirms the above findings. IMPRESSION: 1. Bilateral pyelonephritis with emphysematous pyelitis. Gas within the bilateral renal collecting systems and urinary bladder. 2. Bilateral renal calculi with 6 mm stone in the proximal right ureter. Mild right hydronephrosis. 3. Negative for an aortic dissection. Limited evaluation of the abdominopelvic vascular structures. The left common iliac artery stent appears to be patent. 4. Extensive scarring in the  left kidney. These results were called by telephone at the time of interpretation on 06/26/2018 at 7:26 pm to Dr. Tomi Bamberger , who verbally acknowledged these results. Electronically Signed   By: Markus Daft M.D.   On: 06/26/2018 19:26     LOS: 7 days   Signature  Lala Lund M.D on 07/03/2018 at 2:26 PM  To page go to [email or web address] - password [facility]

## 2018-07-03 NOTE — Progress Notes (Signed)
MD notified of patient's potassium going up to 5.8, plan for D/C tomorrow.

## 2018-07-03 NOTE — Progress Notes (Signed)
[place] for Warfarin Indication: Hx iliac thrombis  Patient Measurements: Height: 5\' 7"  (170.2 cm) Weight: 299 lb 8 oz (135.9 kg) IBW/kg (Calculated) : 61.6  Vital Signs: Temp: 98 F (36.7 C) (09/30 0413) Temp Source: Oral (09/30 0413) BP: 108/51 (09/30 0413) Pulse Rate: 94 (09/30 0413)  Labs: Recent Labs    07/01/18 0341 07/01/18 1200 07/02/18 0608 07/03/18 0525  LABPROT 32.4* 35.2* 36.6* 35.4*  INR 3.19 3.54 3.73 3.57  CREATININE 4.23*  --  3.65*  --     Estimated Creatinine Clearance: 28.6 mL/min (A) (by C-G formula based on SCr of 3.65 mg/dL (H)).  Assessment: 76 YOF on warfarin PTA for h/o iliac thrombosis on warfarin PTA. Admit INR 4.82 on PTA dose of 5 mg daily EXCEPT for 10 mg on T/Th/Sat. Warfarin was reversed for cystoscopy and stent placement on 9/24. Post-op, pharmacy consulted to resume warfarin and currently on lovenox bridge.  INR today remains SUPRAtherapeutic (INR 3.73 << 3.54, goal o 2-3). Will continue to hold warfarin.  Pt is still here today. Hold for next 2 days then recheck INR on wed to see if can be resumed.   Goal of Therapy:  INR goal of 2-3 Monitor platelets by anticoagulation protocol: Yes   Plan:   - Hold warfarin dose tonight - Will continue to monitor for any signs/symptoms of bleeding and will follow up with PT/INR in the a.m. (if still here)  Onnie Boer, PharmD, Jake Samples, AAHIVP, CPP Infectious Disease Pharmacist Pager: [phone] 07/03/2018 10:58 AM

## 2018-07-03 NOTE — Progress Notes (Signed)
Soap sud enema was done.

## 2018-07-03 NOTE — Care Management Note (Addendum)
Case Management Note  Patient Details  Name: Terri Sparks MRN: [id number] Date of Birth: [birth year]  Subjective/Objective:      Sepsis/ UTI / AKI. Reside with fiance'.  Independent with ADL's PTA.DME: cane.   Action/Plan: Transition to home when medically stable. Pt to f/u with PCP on 07/12/2018 @ 2:50 pm for post hospital appointment, [place].  Pt has transportation to home  @ d/c.  Expected Discharge Date:  07/04/2018           Expected Discharge Plan:  Home/Self Care  In-House Referral:     Discharge planning Services  CM Consult, Follow-up appt scheduled, [place] Acute Care Choice:    Choice offered to:     DME Arranged:  N/A DME Agency:  NA  HH Arranged:  NA HH Agency:  NA  Status of Service:  Completed, signed off  If discussed at [place] of Stay Meetings, dates discussed:    Additional Comments:  Sharin Mons, RN 07/03/2018, 3:02 PM

## 2018-07-04 LAB — GLUCOSE, CAPILLARY
Glucose-Capillary: 152 mg/dL — ABNORMAL HIGH (ref 70–99)
Glucose-Capillary: 152 mg/dL — ABNORMAL HIGH (ref 70–99)
Glucose-Capillary: 166 mg/dL — ABNORMAL HIGH (ref 70–99)
Glucose-Capillary: 181 mg/dL — ABNORMAL HIGH (ref 70–99)
Glucose-Capillary: 256 mg/dL — ABNORMAL HIGH (ref 70–99)
Glucose-Capillary: 262 mg/dL — ABNORMAL HIGH (ref 70–99)

## 2018-07-04 LAB — BASIC METABOLIC PANEL
Anion gap: 8 (ref 5–15)
BUN: 37 mg/dL — ABNORMAL HIGH (ref 6–20)
CO2: 25 mmol/L (ref 22–32)
Calcium: 9.3 mg/dL (ref 8.9–10.3)
Chloride: 94 mmol/L — ABNORMAL LOW (ref 98–111)
Creatinine, Ser: 3.11 mg/dL — ABNORMAL HIGH (ref 0.44–1.00)
GFR calc Af Amer: 20 mL/min — ABNORMAL LOW (ref >60)
GFR calc non Af Amer: 17 mL/min — ABNORMAL LOW (ref >60)
Glucose, Bld: 200 mg/dL — ABNORMAL HIGH (ref 70–99)
Potassium: 5.7 mmol/L — ABNORMAL HIGH (ref 3.5–5.1)
Sodium: 127 mmol/L — ABNORMAL LOW (ref 135–145)

## 2018-07-04 LAB — PROTIME-INR
INR: 2.54
Prothrombin Time: 27.1 seconds — ABNORMAL HIGH (ref 11.4–15.2)

## 2018-07-04 LAB — POTASSIUM: Potassium: 5.3 mmol/L — ABNORMAL HIGH (ref 3.5–5.1)

## 2018-07-04 MED ORDER — SODIUM ZIRCONIUM CYCLOSILICATE 10 G PO PACK
10.0000 g | PACK | ORAL | Status: AC
  Administered 2018-07-04: 10 g via ORAL
  Filled 2018-07-04: qty 1

## 2018-07-04 MED ORDER — SODIUM ZIRCONIUM CYCLOSILICATE 10 G PO PACK
10.0000 g | PACK | Freq: Three times a day (TID) | ORAL | Status: DC
  Administered 2018-07-04 – 2018-07-05 (×3): 10 g via ORAL
  Filled 2018-07-04 (×4): qty 1

## 2018-07-04 MED ORDER — SODIUM CHLORIDE 0.9 % IV SOLN
INTRAVENOUS | Status: AC
  Administered 2018-07-04: 15:00:00 via INTRAVENOUS

## 2018-07-04 MED ORDER — MAGNESIUM HYDROXIDE 400 MG/5ML PO SUSP
30.0000 mL | Freq: Two times a day (BID) | ORAL | Status: AC
  Administered 2018-07-04 (×2): 30 mL via ORAL
  Filled 2018-07-04 (×2): qty 30

## 2018-07-04 MED ORDER — SODIUM POLYSTYRENE SULFONATE 15 GM/60ML PO SUSP
30.0000 g | Freq: Once | ORAL | Status: AC
  Administered 2018-07-04: 30 g via ORAL
  Filled 2018-07-04: qty 120

## 2018-07-04 MED ORDER — FUROSEMIDE 10 MG/ML IJ SOLN
80.0000 mg | Freq: Once | INTRAMUSCULAR | Status: AC
  Administered 2018-07-04: 80 mg via INTRAVENOUS
  Filled 2018-07-04: qty 8

## 2018-07-04 MED ORDER — PEG 3350-KCL-NA BICARB-NACL 420 G PO SOLR
2000.0000 mL | Freq: Once | ORAL | Status: AC
  Administered 2018-07-04: 2000 mL via ORAL
  Filled 2018-07-04 (×2): qty 4000

## 2018-07-04 MED ORDER — WARFARIN SODIUM 5 MG PO TABS
5.0000 mg | ORAL_TABLET | Freq: Once | ORAL | Status: AC
  Administered 2018-07-04: 5 mg via ORAL
  Filled 2018-07-04: qty 1

## 2018-07-04 MED ORDER — SODIUM CHLORIDE 0.9 % IV BOLUS
500.0000 mL | Freq: Once | INTRAVENOUS | Status: AC
  Administered 2018-07-04: 500 mL via INTRAVENOUS

## 2018-07-04 NOTE — Progress Notes (Signed)
[place] for Warfarin Indication: Hx iliac thrombis  Patient Measurements: Height: 5\' 7"  (170.2 cm) Weight: 299 lb 8 oz (135.9 kg) IBW/kg (Calculated) : 61.6  Vital Signs: Temp: 98.6 F (37 C) (10/01 0405) Temp Source: Oral (10/01 0405) BP: 122/69 (10/01 0405) Pulse Rate: 92 (10/01 0405)  Labs: Recent Labs    07/02/18 0608 07/03/18 0525 07/03/18 1239 07/04/18 0440  LABPROT 36.6* 35.4*  --  27.1*  INR 3.73 3.57  --  2.54  CREATININE 3.65*  --  3.21* 3.11*    Estimated Creatinine Clearance: 33.6 mL/min (A) (by C-G formula based on SCr of 3.11 mg/dL (H)).  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 Admit INR 4.82 on PTA dose of 5 mg daily EXCEPT for 10 mg on T/Th/Sat. Warfarin was reversed for cystoscopy and stent placement on 9/24. Post-op, pharmacy consulted to resume warfarin.  INR back down to 2.51 (goal o 2-3). Will resume her coumadin today.   On day 7 of abx for e.coli bacteremia. Now on Keflex.  Goal of Therapy:  INR goal of 2-3 Monitor platelets by anticoagulation protocol: Yes   Plan:   - Coumadin 5mg  PO x1 - Will continue to monitor for any signs/symptoms of bleeding and will follow up with PT/INR in the a.m.  Onnie Boer, PharmD, [place], AAHIVP, CPP Infectious Disease Pharmacist Pager: [phone] 07/04/2018 11:04 AM

## 2018-07-04 NOTE — Progress Notes (Signed)
On assessment pt right great toe and foot red, swollen and painful. Small abrasion and small blister noted on anterior aspect of right great toe, no drainage. Per pt skin cracked open due to swelling, she does not recall injury. Schorr, NP notified. Will continue to monitor pt.

## 2018-07-05 LAB — GLUCOSE, CAPILLARY
Glucose-Capillary: 193 mg/dL — ABNORMAL HIGH (ref 70–99)
Glucose-Capillary: 169 mg/dL — ABNORMAL HIGH (ref 70–99)
Glucose-Capillary: 215 mg/dL — ABNORMAL HIGH (ref 70–99)

## 2018-07-05 LAB — BASIC METABOLIC PANEL
Anion gap: 11 (ref 5–15)
BUN: 30 mg/dL — ABNORMAL HIGH (ref 6–20)
CO2: 29 mmol/L (ref 22–32)
Calcium: 9 mg/dL (ref 8.9–10.3)
Chloride: 92 mmol/L — ABNORMAL LOW (ref 98–111)
Creatinine, Ser: 2.79 mg/dL — ABNORMAL HIGH (ref 0.44–1.00)
GFR calc Af Amer: 23 mL/min — ABNORMAL LOW (ref >60)
GFR calc non Af Amer: 20 mL/min — ABNORMAL LOW (ref >60)
Glucose, Bld: 203 mg/dL — ABNORMAL HIGH (ref 70–99)
Potassium: 5.3 mmol/L — ABNORMAL HIGH (ref 3.5–5.1)
Sodium: 132 mmol/L — ABNORMAL LOW (ref 135–145)

## 2018-07-05 LAB — PROTIME-INR
INR: 2.44
Prothrombin Time: 26.3 seconds — ABNORMAL HIGH (ref 11.4–15.2)

## 2018-07-05 LAB — CK: Total CK: 39 U/L (ref 38–234)

## 2018-07-05 MED ORDER — SODIUM POLYSTYRENE SULFONATE 15 GM/60ML PO SUSP: ORAL | 0 refills | Status: DC

## 2018-07-05 MED ORDER — SODIUM POLYSTYRENE SULFONATE PO POWD: Freq: Once | ORAL | 0 refills | Status: DC

## 2018-07-05 MED ORDER — SODIUM ZIRCONIUM CYCLOSILICATE 10 G PO PACK: 10.0000 g | PACK | Freq: Three times a day (TID) | ORAL | 0 refills | Status: DC

## 2018-07-05 MED FILL — SODIUM POLYSTYRENE SULF PWD: 7 days supply | Qty: 210 | Fill #0

## 2018-07-05 NOTE — Progress Notes (Signed)
Pt instructed to elevate her lower extremities, pt non compliant for most of the time.

## 2018-07-05 NOTE — Progress Notes (Signed)
Terri Sparks to be D/C'd Home per MD order.  Discussed with the patient and all questions fully answered.  VSS, Skin clean, dry and intact without evidence of skin break down, no evidence of skin tears noted. IV catheter discontinued intact. Site without signs and symptoms of complications. Dressing and pressure applied.  An After Visit Summary was printed and given to the patient. Patient received prescription.  D/c education completed with patient/family including follow up instructions, medication list, d/c activities limitations if indicated, with other d/c instructions as indicated by MD - patient able to verbalize understanding, all questions fully answered.   Patient instructed to return to ED, call 911, or call MD for any changes in condition.   Patient escorted via [place], and D/C home via private auto.  [name] 07/05/2018 2:40 PM

## 2018-07-05 NOTE — Discharge Instructions (Signed)
Follow with Primary MD Ladell Pier, MD in 2 days   Get CBC, CMP, INR checked  by Primary MD in 2 days    Activity: As tolerated with Full fall precautions use walker/cane & assistance as needed  Disposition Home    Diet: Low Potassiium Low Carb. Check CBGs QA CHS.  1.5 L/day total fluid restriction   Special Instructions: If you have smoked or chewed Tobacco  in the last 2 yrs please stop smoking, stop any regular Alcohol  and or any Recreational drug use.  On your next visit with your primary care physician please Get Medicines reviewed and adjusted.  Please request your Prim.MD to go over all Hospital Tests and Procedure/Radiological results at the follow up, please get all Hospital records sent to your Prim MD by signing hospital release before you go home.  If you experience worsening of your admission symptoms, develop shortness of breath, life threatening emergency, suicidal or homicidal thoughts you must seek medical attention immediately by calling 911 or calling your MD immediately  if symptoms less severe.  You Must read complete instructions/literature along with all the possible adverse reactions/side effects for all the Medicines you take and that have been prescribed to you. Take any new Medicines after you have completely understood and accpet all the possible adverse reactions/side effects.

## 2018-07-05 NOTE — Progress Notes (Signed)
Inpatient Diabetes Program Recommendations  AACE/ADA: New Consensus Statement on Inpatient Glycemic Control (2015)  Target Ranges:  Prepandial:   less than 140 mg/dL      Peak postprandial:   less than 180 mg/dL (1-2 hours)      Critically ill patients:  140 - 180 mg/dL   Lab Results  Component Value Date   GLUCAP 215 (H) 07/05/2018   HGBA1C 8.7 (A) 06/20/2018    Review of Glycemic ControlResults for SERAPHIM, AFFINITO (MRN [id number]) as of 07/05/2018 13:20  Ref. Range 07/04/2018 20:29 07/04/2018 23:54 07/05/2018 04:18 07/05/2018 08:03 07/05/2018 11:59  Glucose-Capillary Latest Ref Range: 70 - 99 mg/dL 152 (H) 262 (H) 193 (H) 169 (H) 215 (H)    Diabetes history: DM2 Outpatient Diabetes medications:  Novolog 5-15 units tid with meals, Lantus 70 units daily Current orders for Inpatient glycemic control:  Lantus 30 units daily, Novolog sensitive q 4 hours  Inpatient Diabetes Program Recommendations:   Consider increasing Lantus to 35 units daily. Consider changing Novolog to sensitive tid with meals and HS.  Also, please consider adding Novolog 3 units tid with meals.   Thanks,  Adah Perl, RN, BC-ADM Inpatient Diabetes Coordinator Pager [phone] (8a-5p)

## 2018-07-05 NOTE — Progress Notes (Signed)
Pt reported having 6 BMs last night, most of it diarrhea.

## 2018-07-05 NOTE — Progress Notes (Signed)
NCM faxed Kayxelate Rx to [facility] outpatient pharmacy, pt cost  $15.49 per pharmacy technician. NCM made pt aware. Whitman Hero RN,BSN,CM

## 2018-07-11 NOTE — Progress Notes (Deleted)
Patient ID: Terri Sparks, female   DOB: 09-19-1976, 42 y.o.   MRN: [id number]  After hospitalization 9/23-10/11/2017 for sepsis and pyelonephritis.  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 coli bacteremia. CT imaging also showed a 6 mm proximal stone in the right ureter with mild right hydronephrosis. Urology and IR both were consulted, she subsequently underwent cystoscopy with right stent placement on 9/24. 1 out of 2 blood cultures were subsequently positive for gram-negative rods.   Hospital course: Sepsis secondary to bilateral pyelonephritis and E. coli bacteremia:Sepsis physiology has resolved, 1/2 blood cultures positive for pansensitive E. coli-remains on IV Rocephin. Sepsis physiology seems to have resolved, she has tolerated now oral Keflex for a day and will be placed on it for 7 more days to complete a total of 11-day course, post discharge she will follow with urology along with PCP, request PCP to recheck BMP, INR, CBC within 3 to 4 days of discharge.  Right ureteral stone with minimal right-sided hydronephrosis:Underwent cystoscopy with right JJ stent placement on 9/24-renal function slowly improving. Urology consulted but has subsequently signed out. Post discharge we will have her follow with urology within a week.  Acute kidney injury on CKD stage III:AKI multifactorial-hemodynamically mediated in the setting of sepsis with some contribution from obstructive uropathy.  Creatinine peaked at 6.5, her renal function has dramatically improved, she has now some evidence of fluid overload due to hydration which she  required for sepsis, will discontinue her ACE inhibitor upon discharge and place her on low-dose Lasix, request PCP to kindly monitor BMP and diuretic dose within 3 to 4 days of discharge to make sure her renal function continues to improve well with diuresis  Non-anion gap metabolic acidosis:Due to ARF and sepsis has resolved.  Hyponatremia: Due to dilution from fluid overload, start gentle Lasix and monitor by PCP upon discharge.  Hyperkalemia: Has underlying RTA for due to her DM type II, note patient received multiple medications including Lasix 80 mg IV, several doses of Kayexalate, several doses of Lokelma and Valtesa, she had over 10 bowel movements with GoLYTELY, potassium has come down but still on the higher side I discussed the case with nephrologist Dr. Johnney Ou who recommended patient can be discharged on [facility] with outpatient PCP and nephrology follow-up.  She has been requested to be on a low potassium diet as well.   Note after intense efforts by case management it was found that patient could not afford lokelma medication despite assistance, Kayexalate was prescribed instead.   Hypertension:Controlled-continue to hold all antihypertensives.  Insulin-dependent DM-2: Renew home regimen and follow with PCP for glycemic control.   GERD:Continue PPI  History of recurrent thromboembolism causing ischemic left leg x2-required thrombectomy and angioplasty to left tibial artery August 2019: On Coumadin INR is 3.7 today, asked to hold Coumadin for 2 more days and follow with PCP in 3 days for repeat INR check.

## 2018-07-12 ENCOUNTER — Inpatient Hospital Stay: Payer: Self-pay

## 2018-07-14 ENCOUNTER — Ambulatory Visit: Payer: Self-pay | Attending: Internal Medicine | Admitting: Pharmacist

## 2018-07-14 DIAGNOSIS — I745 Embolism and thrombosis of iliac artery: Secondary | ICD-10-CM

## 2018-07-14 DIAGNOSIS — Z7901 Long term (current) use of anticoagulants: Secondary | ICD-10-CM | POA: Insufficient documentation

## 2018-07-14 DIAGNOSIS — I749 Embolism and thrombosis of unspecified artery: Secondary | ICD-10-CM

## 2018-07-14 LAB — POCT INR: INR: 2.8 (ref 2.0–3.0)

## 2018-07-15 DIAGNOSIS — Z0279 Encounter for issue of other medical certificate: Secondary | ICD-10-CM | POA: Diagnosis not present

## 2018-07-25 ENCOUNTER — Encounter: Payer: Self-pay | Admitting: Internal Medicine

## 2018-07-25 ENCOUNTER — Ambulatory Visit: Payer: Self-pay | Attending: Internal Medicine | Admitting: Internal Medicine

## 2018-07-25 VITALS — BP 118/83 | HR 109 | Temp 98.3°F | Resp 16 | Wt 268.6 lb

## 2018-07-25 DIAGNOSIS — Z8249 Family history of ischemic heart disease and other diseases of the circulatory system: Secondary | ICD-10-CM | POA: Insufficient documentation

## 2018-07-25 DIAGNOSIS — Z7982 Long term (current) use of aspirin: Secondary | ICD-10-CM | POA: Insufficient documentation

## 2018-07-25 DIAGNOSIS — N132 Hydronephrosis with renal and ureteral calculous obstruction: Secondary | ICD-10-CM | POA: Insufficient documentation

## 2018-07-25 DIAGNOSIS — Z833 Family history of diabetes mellitus: Secondary | ICD-10-CM | POA: Insufficient documentation

## 2018-07-25 DIAGNOSIS — N1330 Unspecified hydronephrosis: Secondary | ICD-10-CM

## 2018-07-25 DIAGNOSIS — Z881 Allergy status to other antibiotic agents status: Secondary | ICD-10-CM | POA: Insufficient documentation

## 2018-07-25 DIAGNOSIS — F17210 Nicotine dependence, cigarettes, uncomplicated: Secondary | ICD-10-CM | POA: Insufficient documentation

## 2018-07-25 DIAGNOSIS — Z880 Allergy status to penicillin: Secondary | ICD-10-CM | POA: Insufficient documentation

## 2018-07-25 DIAGNOSIS — Z885 Allergy status to narcotic agent status: Secondary | ICD-10-CM | POA: Insufficient documentation

## 2018-07-25 DIAGNOSIS — N183 Chronic kidney disease, stage 3 (moderate): Secondary | ICD-10-CM | POA: Insufficient documentation

## 2018-07-25 DIAGNOSIS — Z79891 Long term (current) use of opiate analgesic: Secondary | ICD-10-CM

## 2018-07-25 DIAGNOSIS — E1165 Type 2 diabetes mellitus with hyperglycemia: Secondary | ICD-10-CM | POA: Insufficient documentation

## 2018-07-25 DIAGNOSIS — E1140 Type 2 diabetes mellitus with diabetic neuropathy, unspecified: Secondary | ICD-10-CM | POA: Insufficient documentation

## 2018-07-25 DIAGNOSIS — IMO0001 Reserved for inherently not codable concepts without codable children: Secondary | ICD-10-CM

## 2018-07-25 DIAGNOSIS — E278 Other specified disorders of adrenal gland: Secondary | ICD-10-CM

## 2018-07-25 DIAGNOSIS — N12 Tubulo-interstitial nephritis, not specified as acute or chronic: Secondary | ICD-10-CM | POA: Insufficient documentation

## 2018-07-25 DIAGNOSIS — G5792 Unspecified mononeuropathy of left lower limb: Secondary | ICD-10-CM

## 2018-07-25 DIAGNOSIS — Z7901 Long term (current) use of anticoagulants: Secondary | ICD-10-CM | POA: Insufficient documentation

## 2018-07-25 DIAGNOSIS — Z9889 Other specified postprocedural states: Secondary | ICD-10-CM | POA: Insufficient documentation

## 2018-07-25 DIAGNOSIS — K219 Gastro-esophageal reflux disease without esophagitis: Secondary | ICD-10-CM | POA: Insufficient documentation

## 2018-07-25 DIAGNOSIS — Z794 Long term (current) use of insulin: Secondary | ICD-10-CM | POA: Insufficient documentation

## 2018-07-25 DIAGNOSIS — E1122 Type 2 diabetes mellitus with diabetic chronic kidney disease: Secondary | ICD-10-CM | POA: Insufficient documentation

## 2018-07-25 DIAGNOSIS — N179 Acute kidney failure, unspecified: Secondary | ICD-10-CM | POA: Insufficient documentation

## 2018-07-25 DIAGNOSIS — E279 Disorder of adrenal gland, unspecified: Secondary | ICD-10-CM

## 2018-07-25 DIAGNOSIS — F419 Anxiety disorder, unspecified: Secondary | ICD-10-CM | POA: Insufficient documentation

## 2018-07-25 DIAGNOSIS — Z888 Allergy status to other drugs, medicaments and biological substances status: Secondary | ICD-10-CM | POA: Insufficient documentation

## 2018-07-25 DIAGNOSIS — F329 Major depressive disorder, single episode, unspecified: Secondary | ICD-10-CM | POA: Insufficient documentation

## 2018-07-25 DIAGNOSIS — Z792 Long term (current) use of antibiotics: Secondary | ICD-10-CM | POA: Insufficient documentation

## 2018-07-25 DIAGNOSIS — Z818 Family history of other mental and behavioral disorders: Secondary | ICD-10-CM | POA: Insufficient documentation

## 2018-07-25 DIAGNOSIS — R918 Other nonspecific abnormal finding of lung field: Secondary | ICD-10-CM | POA: Insufficient documentation

## 2018-07-25 DIAGNOSIS — B9620 Unspecified Escherichia coli [E. coli] as the cause of diseases classified elsewhere: Secondary | ICD-10-CM

## 2018-07-25 DIAGNOSIS — I129 Hypertensive chronic kidney disease with stage 1 through stage 4 chronic kidney disease, or unspecified chronic kidney disease: Secondary | ICD-10-CM | POA: Insufficient documentation

## 2018-07-25 DIAGNOSIS — N289 Disorder of kidney and ureter, unspecified: Secondary | ICD-10-CM

## 2018-07-25 DIAGNOSIS — Z882 Allergy status to sulfonamides status: Secondary | ICD-10-CM | POA: Insufficient documentation

## 2018-07-25 LAB — GLUCOSE, POCT (MANUAL RESULT ENTRY): POC Glucose: 192 mg/dl — AB (ref 70–99)

## 2018-07-25 MED ORDER — TRAMADOL HCL 50 MG PO TABS: 50.0000 mg | ORAL_TABLET | Freq: Every day | ORAL | 1 refills | Status: DC | PRN

## 2018-07-25 MED ORDER — GABAPENTIN 400 MG PO CAPS: 800.0000 mg | ORAL_CAPSULE | Freq: Two times a day (BID) | ORAL | 3 refills | Status: DC

## 2018-07-25 NOTE — Progress Notes (Signed)
268.6   Pt states she is having pain in her lower back

## 2018-07-25 NOTE — Progress Notes (Signed)
Patient ID: Terri Sparks, female    DOB: [birth year]  MRN: [id number]  CC: Hospitalization Follow-up   Subjective: Terri Sparks is a 42 y.o. female who presents for chronic ds management.  Boyfriend, Montine Circle, is with her Her concerns today include:  42 year old female with history ofDM type2 with microalbumin, tob dep,PVDwith hx LLEacute limb ischemia requiring stent to the iliac artery and embolectomy followed by Dr. Zenia Resides vascular surgery, hyperparathyroidism s/p resection of parathryroid adenoma 2010, GERD,IDA,and anxiety/dep  Since last visit with me, patient was hospitalized 9/23- 07/05/2018 with urosepsis due to E. coli.  Patient was found to have right hydronephrosis with elevation of creatinine up to 6.  ACE inhibitor and diuretics were held.  Urology was consulted and right ureteral stent placed.  At the time of discharge, her creatinine had decreased to 2.79.  She was advised to follow-up with urology.  She was discharged on cephalexin.  Patient still does not have the orange card or [facility] discount card.  Since discharge she denies any fever.  She noticed that the urine was a little orange yesterday but no frank blood in the urine.  DM: Reports decreased appetite.  She has stopped eating junk foods.  She never did start the Victoza because her appetite has been down.  She reports that she is no longer sick to the stomach.  Blood sugars have been better with none over 200.  She forgot to bring her logbook with her today.  She has loss 12 lbs since 05/2018.  Adrenal adenoma: I had sent a prescription to the pharmacy for dexamethasone and orders were placed for cortisol and plasma metanephrines.  Patient states that she took the dexamethasone but overslept the next day when she was supposed to come to the lab to have the cortisol level drawn.  She is requesting new prescription to be sent to her pharmacy for the dexamethasone.  She is requesting increased dose of gabapentin from  600 mg twice a day to 800 mg twice a day for neuropathy in her left leg.  She also requests refill on tramadol.  Patient Active Problem List   Diagnosis Date Noted  . Pyelonephritis   . Acute renal failure superimposed on stage 3 chronic kidney disease ([place]) 06/26/2018  . Sepsis due to urinary tract infection ([place]) 06/26/2018  . 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 Right ureteral stone 06/26/2018  . Essential hypertension 06/26/2018  . Supratherapeutic INR 06/26/2018  . Adrenal nodule ([place]) 06/21/2018  . Lung nodules 06/21/2018  . Abnormal uterine bleeding (AUB) 05/14/2018  . Hemorrhagic cyst of right ovary 05/14/2018  . Acute blood loss anemia 05/14/2018  . Ischemia of extremity 05/12/2018  . Former smoker 03/14/2018  . 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 Thromboembolism ([place]) 02/25/2018  . Microalbuminuria 09/29/2017  . Insomnia 09/29/2017  . Immunization due 08/01/2017  . Anxiety and depression 04/28/2017  . 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 Tobacco abuse 02/25/2017  . Diabetes mellitus, type II ([place]) 02/25/2017  . Peripheral vascular disease of lower extremity ([place]) 02/24/2017  . GERD (gastroesophageal reflux disease)   . Hyperparathyroidism      Current Outpatient Medications on File Prior to Visit  Medication Sig Dispense Refill  . acetaminophen (TYLENOL) 500 MG tablet Take 1,000 mg by mouth every 6 (six) hours as needed (for pain).     Marland Kitchen albuterol (PROAIR HFA) 108 (90 Base) MCG/ACT inhaler Inhale 2 puffs into  the lungs every 6 (six) hours as needed for wheezing or shortness of breath.    Marland Kitchen aspirin EC 81 MG tablet Take 81 mg by mouth daily.    . blood glucose meter kit and supplies Dispense based on patient and insurance preference. Use up to four times daily as directed. (FOR ICD-9 250.00, 250.01). 1 each 0  . Blood Glucose Monitoring Suppl (TRUE METRIX METER) w/Device KIT Use as directed 1  kit 0  . buPROPion (BUPROBAN) 150 MG 12 hr tablet Take 1 tablet (150 mg total) by mouth 2 (two) times daily. 60 tablet 2  . cephALEXin (KEFLEX) 500 MG capsule Take 1 capsule (500 mg total) by mouth 3 (three) times daily. 21 capsule 0  . dexamethasone (DECADRON) 1 MG tablet 1 tab PO at 11 p.m night before lab test. 1 tablet 0  . diphenhydrAMINE (BENADRYL) 25 MG tablet Take 25 mg by mouth every 6 (six) hours as needed for allergies.     Marland Kitchen docusate sodium (COLACE) 100 MG capsule Take 100 mg by mouth daily.    . ferrous sulfate 325 (65 FE) MG tablet Take 1 tablet (325 mg total) by mouth daily with breakfast. 100 tablet 1  . furosemide (LASIX) 40 MG tablet Take 1 tablet (40 mg total) by mouth daily. 10 tablet 0  . glucose blood (TRUE METRIX BLOOD GLUCOSE TEST) test strip Use as instructed 100 each 12  . glucose blood test strip Use as instructed 100 each 0  . insulin aspart (NOVOLOG FLEXPEN) 100 UNIT/ML FlexPen 5-15 units subcut TID with meals (Patient taking differently: Inject 5-15 Units into the skin 3 (three) times daily with meals. ) 15 mL 11  . Insulin Glargine (LANTUS SOLOSTAR) 100 UNIT/ML Solostar Pen Inject 70 Units into the skin daily. (Patient taking differently: Inject 70 Units into the skin at bedtime. ) 15 mL 11  . Insulin Pen Needle 31G X 5 MM MISC Use with Lantus and Novolog injections. 100 each 11  . Insulin Syringe-Needle U-100 (BD INSULIN SYRINGE ULTRAFINE) 31G X 5/16" 0.5 ML MISC Use as directed 100 each 3  . liraglutide (VICTOZA) 18 MG/3ML SOPN Inject 0.1 mLs (0.6 mg total) into the skin daily. 3 pen 3  . megestrol (MEGACE) 40 MG tablet Take 2 tablets (80 mg total) by mouth daily. 180 tablet 3  . nicotine (NICODERM CQ - DOSED IN MG/24 HOURS) 14 mg/24hr patch Place 1 patch (14 mg total) onto the skin daily. (Patient not taking: Reported on 06/26/2018) 28 patch 1  . omeprazole (PRILOSEC) 20 MG capsule Take 2 capsules (40 mg total) by mouth daily. (Patient taking differently: Take 40 mg  by mouth 2 (two) times daily as needed (for reflux). ) 60 capsule 2  . ondansetron (ZOFRAN) 4 MG tablet Take 1 tablet (4 mg total) by mouth every 8 (eight) hours as needed for nausea or vomiting. (Patient not taking: Reported on 06/26/2018) 20 tablet 0  . oxybutynin (DITROPAN) 5 MG tablet Take 1 tablet (5 mg total) by mouth every 8 (eight) hours as needed for bladder spasms. 30 tablet 1  . phenazopyridine (PYRIDIUM) 200 MG tablet Take 1 tablet (200 mg total) by mouth 3 (three) times daily as needed (for pain with urination). 30 tablet 0  . polyethylene glycol (MIRALAX) packet Take 17 g by mouth daily. 28 each 0  . sodium polystyrene (KAYEXALATE) 15 GM/60ML suspension Orally 30 gm daily dispense 1 month supply 500 mL 0  . TRUEPLUS LANCETS 28G MISC Use as  directed 100 each 6  . warfarin (COUMADIN) 5 MG tablet Take 1-2 tablets (5-10 mg total) by mouth See admin instructions. 5 mg Sun, Mon , wed,  Fri 10 mg true, Thurs sat     No current facility-administered medications on file prior to visit.     Allergies  Allergen Reactions  . Ciprofloxacin Hcl Hives  . Macrobid [Nitrofurantoin Macrocrystal] Hives, Shortness Of Breath and Rash  . Other Hives, Shortness Of Breath and Rash    NO "-CILLINS"!!!  . Penicillins Shortness Of Breath    Had had cephalosporins without incident Has patient had a PCN reaction causing immediate rash, facial/tongue/throat swelling, SOB or lightheadedness with hypotension: Yes Has patient had a PCN reaction causing severe rash involving mucus membranes or skin necrosis: Unk Has patient had a PCN reaction that required hospitalization: Unk Has patient had a PCN reaction occurring within the last 10 years: No If all of the above answers are "NO", then may proceed with Cephalosporin use.   . Sulfa Antibiotics Hives, Shortness Of Breath and Rash  . Dilaudid [Hydromorphone Hcl] Other (See Comments)    Asystole per pt report  . Lexapro [Escitalopram Oxalate] Other (See  Comments)    "I just did not like it."    Social History   Socioeconomic History  . Marital status: Single    Spouse name: Not on file  . Number of children: Not on file  . Years of education: Not on file  . Highest education level: Not on file  Occupational History  . Not on file  Social Needs  . Financial resource strain: Not on file  . Food insecurity:    Worry: Not on file    Inability: Not on file  . Transportation needs:    Medical: Not on file    Non-medical: Not on file  Tobacco Use  . Smoking status: Current Some Day Smoker    Packs/day: 0.25    Years: 15.00    Pack years: 3.75    Types: Cigarettes    Last attempt to quit: 03/04/2017    Years since quitting: 1.3  . Smokeless tobacco: Never Used  Substance and Sexual Activity  . Alcohol use: No  . Drug use: Yes    Types: Marijuana    Comment: occasionally  . Sexual activity: Yes    Partners: Male    Birth control/protection: Other-see comments    Comment: BTL  Lifestyle  . Physical activity:    Days per week: Not on file    Minutes per session: Not on file  . Stress: Not on file  Relationships  . Social connections:    Talks on phone: Not on file    Gets together: Not on file    Attends religious service: Not on file    Active member of club or organization: Not on file    Attends meetings of clubs or organizations: Not on file    Relationship status: Not on file  . Intimate partner violence:    Fear of current or ex partner: Not on file    Emotionally abused: Not on file    Physically abused: Not on file    Forced sexual activity: Not on file  Other Topics Concern  . Not on file  Social History Narrative  . Not on file    Family History  Problem Relation Age of Onset  . Depression Mother   . Diabetes Father   . Heart failure Father     Past  Surgical History:  Procedure Laterality Date  . ABDOMINAL AORTOGRAM W/LOWER EXTREMITY N/A 11/14/2017   Procedure: ABDOMINAL AORTOGRAM W/LOWER  EXTREMITY;  Surgeon: Waynetta Sandy, MD;  Location: [facility];  Service: Cardiovascular;  Laterality: N/A;  . ANGIOPLASTY ILLIAC ARTERY Left 02/25/2018   Procedure: BALLOON ANGIOPLASTY LEFT ILIAC ARTERY;  Surgeon: Conrad [place], MD;  Location: [facility];  Service: Vascular;  Laterality: Left;  . APPLICATION OF WOUND VAC Left 03/03/2018   Procedure: APPLICATION OF WOUND VAC;  Surgeon: Angelia Mould, MD;  Location: [facility];  Service: Vascular;  Laterality: Left;  . CYSTOSCOPY W/ URETERAL STENT PLACEMENT Right 06/27/2018   Procedure: CYSTOSCOPY WITH RETROGRADE PYELOGRAM/URETERAL DOUBLE J STENT PLACEMENT;  Surgeon: Ceasar Mons, MD;  Location: [facility];  Service: Urology;  Laterality: Right;  . EMBOLECTOMY Left 02/24/2017   Procedure: Left Lower Extremity Embolectomy and Angiogram.;  Surgeon: Waynetta Sandy, MD;  Location: [facility];  Service: Vascular;  Laterality: Left;  . INSERTION OF ILIAC STENT  02/24/2017   Procedure: INSERTION OF Common ILIAC STENT;  Surgeon: Waynetta Sandy, MD;  Location: [facility];  Service: Vascular;;  . INTRAOPERATIVE ARTERIOGRAM Left 02/25/2018   Procedure: INTRA OPERATIVE ARTERIOGRAM WITH LEFT LEG RUNOFF;  Surgeon: Conrad [place], MD;  Location: [facility];  Service: Vascular;  Laterality: Left;  . LOWER EXTREMITY ANGIOGRAM Left 02/24/2017   Procedure: Aortagram, Left lower extremity Run-off;  Surgeon: Waynetta Sandy, MD;  Location: [facility];  Service: Vascular;  Laterality: Left;  . PATCH ANGIOPLASTY Left 02/25/2018   Procedure: PATCH ANGIOPLASTY LEFT SUPERFICIAL FEMORAL ARTERY WITH BOVINE PATCH;  Surgeon: Conrad [place], MD;  Location: [facility];  Service: Vascular;  Laterality: Left;  . PERCUTANEOUS VENOUS THROMBECTOMY,LYSIS WITH INTRAVASCULAR ULTRASOUND (IVUS) Left 05/12/2018   Procedure: MECHANICAL THROMBECTOMY LEFT LEG, BALLOON ANGIOPLASTY LEFT ANTERIOR TIBIAL ARTERY, AORTOGRAM WITH LEFT LEG RUNOFF;  Surgeon: Serafina Mitchell, MD;   Location: [facility];  Service: Vascular;  Laterality: Left;  . 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 THROMBECTOMY FEMORAL ARTERY Left 02/25/2018   Procedure: LEFT ILIAC AND POPLITEAL ARTERY THROMBECTOMY;  Surgeon: Conrad [place], MD;  Location: [facility];  Service: Vascular;  Laterality: Left;  . TUBAL LIGATION    . WOUND DEBRIDEMENT Left 03/03/2018   Procedure: DEBRIDEMENT WOUND;  Surgeon: Angelia Mould, MD;  Location: [facility];  Service: Vascular;  Laterality: Left;  . WOUND EXPLORATION Left 03/03/2018   Procedure: WOUND EXPLORATION;  Surgeon: Angelia Mould, MD;  Location: [facility];  Service: Vascular;  Laterality: Left;    ROS: Review of Systems Neg except as above PHYSICAL EXAM: BP 118/83   Pulse (!) 109   Temp 98.3 F (36.8 C) (Oral)   Resp 16   Wt 268 lb 9.6 oz (121.8 kg)   SpO2 99%   BMI 42.07 kg/m   Wt Readings from Last 3 Encounters:  07/25/18 268 lb 9.6 oz (121.8 kg)  07/02/18 299 lb 8 oz (135.9 kg)  06/20/18 275 lb 12.8 oz (125.1 kg)    Physical Exam  General appearance - alert, well appearing, and in no distress Mental status - normal mood, behavior, speech, dress, motor activity, and thought processes Neck - supple, no significant adenopathy Chest - clear to auscultation, no wheezes, rales or rhonchi, symmetric air entry Heart - normal rate, regular rhythm, normal S1, S2, no murmurs, rubs, clicks or gallops Extremities - peripheral pulses normal, no pedal edema, no clubbing or cyanosis  Results for  orders placed or performed in visit on 07/25/18  Microalbumin / creatinine urine ratio  Result Value Ref Range   Creatinine, Urine 60.0 Not Estab. mg/dL   Microalbumin, Urine 404.7 Not Estab. ug/mL   Microalb/Creat Ratio 674.5 (H) 0.0 - 30.0 mg/g creat  Basic metabolic panel  Result Value Ref Range   Glucose 115 (H) 65 - 99 mg/dL   BUN 26 (H) 6 - 24 mg/dL   Creatinine, Ser 2.09 (H) 0.57 - 1.00 mg/dL   GFR calc non Af Amer 28 (L) >59 mL/min/1.73   GFR calc  Af Amer 33 (L) >59 mL/min/1.73   BUN/Creatinine Ratio 12 9 - 23   Sodium 137 134 - 144 mmol/L   Potassium 4.9 3.5 - 5.2 mmol/L   Chloride 107 (H) 96 - 106 mmol/L   CO2 14 (L) 20 - 29 mmol/L   Calcium 10.7 (H) 8.7 - 10.2 mg/dL  CBC With Differential  Result Value Ref Range   WBC 10.1 3.4 - 10.8 x10E3/uL   RBC 4.36 3.77 - 5.28 x10E6/uL   Hemoglobin 12.3 11.1 - 15.9 g/dL   Hematocrit 37.8 34.0 - 46.6 %   MCV 87 79 - 97 fL   MCH 28.2 26.6 - 33.0 pg   MCHC 32.5 31.5 - 35.7 g/dL   RDW 16.4 (H) 12.3 - 15.4 %   Neutrophils 53 Not Estab. %   Lymphs 34 Not Estab. %   Monocytes 6 Not Estab. %   Eos 6 Not Estab. %   Basos 1 Not Estab. %   Neutrophils Absolute 5.3 1.4 - 7.0 x10E3/uL   Lymphocytes Absolute 3.4 (H) 0.7 - 3.1 x10E3/uL   Monocytes Absolute 0.6 0.1 - 0.9 x10E3/uL   EOS (ABSOLUTE) 0.6 (H) 0.0 - 0.4 x10E3/uL   Basophils Absolute 0.1 0.0 - 0.2 x10E3/uL   Immature Granulocytes 0 Not Estab. %   Immature Grans (Abs) 0.0 0.0 - 0.1 x10E3/uL  POCT glucose (manual entry)  Result Value Ref Range   POC Glucose 192 (A) 70 - 99 mg/dl    ASSESSMENT AND PLAN: 1. Hydronephrosis, unspecified hydronephrosis type Encourage pt again to apply for [facility] discount. She reports transportation issues. Unable to get to places where she needs to go to get supporting documents to apply. She does not drive; boyfriend works and drives stick shift.  Will have case worker contact her to see what can be done to help with transportation issues - Ambulatory referral to Urology  2. Pyelonephritis due to Escherichia coli Completed abx  3. Acute renal insufficiency Slowing improving post stent placement. Now with macroalbuminuria.  Will repeat on f/u visit and make determination about possible nephrology referral.  4. Uncontrolled type 2 diabetes mellitus without complication, with long-term current use of insulin ([place]) Hold on Victozia.  BS improving.  Continue to encourage healthy eating habits Pt to bring  BS readings on next visit - POCT glucose (manual entry) - Microalbumin / creatinine urine ratio - Basic metabolic panel - CBC With Differential  5. Opioid contract exists Discussed risk and benefits associated with long term use of narcotics. Discussed our Control Subst Prescribing Agreement.  Pt expressed understanding and signed document - [id number] 11+Oxyco+Alc+Crt-Bund - traMADol (ULTRAM) 50 MG tablet; Take 1 tablet (50 mg total) by mouth daily as needed (for pain).  Dispense: 30 tablet; Refill: 1  6. Neuropathy of left lower extremity Gabapentin increased to 800 mg BID - traMADol (ULTRAM) 50 MG tablet; Take 1 tablet (50 mg total) by mouth daily as  needed (for pain).  Dispense: 30 tablet; Refill: 1 - gabapentin (NEURONTIN) 400 MG capsule; Take 2 capsules (800 mg total) by mouth 2 (two) times daily.  Dispense: 120 capsule; Refill: 3  7. Hypercalcemia - Parathyroid hormone, intact (no Ca)  8. Adrenal nodule [facility])    Patient was given the opportunity to ask questions.  Patient verbalized understanding of the plan and was able to repeat key elements of the plan.   Orders Placed This Encounter  Procedures  . Microalbumin / creatinine urine ratio  . Basic metabolic panel  . [id number] 11+Oxyco+Alc+Crt-Bund  . CBC With Differential  . Parathyroid hormone, intact (no Ca)  . Ambulatory referral to Urology  . POCT glucose (manual entry)     Requested Prescriptions   Signed Prescriptions Disp Refills  . traMADol (ULTRAM) 50 MG tablet 30 tablet 1    Sig: Take 1 tablet (50 mg total) by mouth daily as needed (for pain).  Marland Kitchen gabapentin (NEURONTIN) 400 MG capsule 120 capsule 3    Sig: Take 2 capsules (800 mg total) by mouth 2 (two) times daily.    Return in about 1 month (around 08/25/2018).  Karle Plumber, MD, FACP

## 2018-07-26 LAB — BASIC METABOLIC PANEL
BUN/Creatinine Ratio: 12 (ref 9–23)
BUN: 26 mg/dL — ABNORMAL HIGH (ref 6–24)
CO2: 14 mmol/L — ABNORMAL LOW (ref 20–29)
Calcium: 10.7 mg/dL — ABNORMAL HIGH (ref 8.7–10.2)
Chloride: 107 mmol/L — ABNORMAL HIGH (ref 96–106)
Creatinine, Ser: 2.09 mg/dL — ABNORMAL HIGH (ref 0.57–1.00)
GFR calc Af Amer: 33 mL/min/{1.73_m2} — ABNORMAL LOW (ref >59)
GFR calc non Af Amer: 28 mL/min/{1.73_m2} — ABNORMAL LOW (ref >59)
Glucose: 115 mg/dL — ABNORMAL HIGH (ref 65–99)
Potassium: 4.9 mmol/L (ref 3.5–5.2)
Sodium: 137 mmol/L (ref 134–144)

## 2018-07-26 LAB — MICROALBUMIN / CREATININE URINE RATIO
Creatinine, Urine: 60 mg/dL
Microalb/Creat Ratio: 674.5 mg/g creat — ABNORMAL HIGH (ref 0.0–30.0)
Microalbumin, Urine: 404.7 ug/mL

## 2018-07-26 LAB — CBC WITH DIFFERENTIAL
Basophils Absolute: 0.1 10*3/uL (ref 0.0–0.2)
Basos: 1 %
EOS (ABSOLUTE): 0.6 10*3/uL — ABNORMAL HIGH (ref 0.0–0.4)
Eos: 6 %
Hematocrit: 37.8 % (ref 34.0–46.6)
Hemoglobin: 12.3 g/dL (ref 11.1–15.9)
Immature Grans (Abs): 0 10*3/uL (ref 0.0–0.1)
Immature Granulocytes: 0 %
Lymphocytes Absolute: 3.4 10*3/uL — ABNORMAL HIGH (ref 0.7–3.1)
Lymphs: 34 %
MCH: 28.2 pg (ref 26.6–33.0)
MCHC: 32.5 g/dL (ref 31.5–35.7)
MCV: 87 fL (ref 79–97)
Monocytes Absolute: 0.6 10*3/uL (ref 0.1–0.9)
Monocytes: 6 %
Neutrophils Absolute: 5.3 10*3/uL (ref 1.4–7.0)
Neutrophils: 53 %
RBC: 4.36 x10E6/uL (ref 3.77–5.28)
RDW: 16.4 % — ABNORMAL HIGH (ref 12.3–15.4)
WBC: 10.1 10*3/uL (ref 3.4–10.8)

## 2018-07-27 ENCOUNTER — Telehealth: Payer: Self-pay

## 2018-07-27 ENCOUNTER — Other Ambulatory Visit: Payer: Self-pay | Admitting: Internal Medicine

## 2018-07-27 DIAGNOSIS — F17200 Nicotine dependence, unspecified, uncomplicated: Secondary | ICD-10-CM

## 2018-07-27 DIAGNOSIS — E278 Other specified disorders of adrenal gland: Secondary | ICD-10-CM

## 2018-07-27 DIAGNOSIS — E279 Disorder of adrenal gland, unspecified: Principal | ICD-10-CM

## 2018-07-27 MED ORDER — DEXAMETHASONE 1 MG PO TABS: ORAL_TABLET | ORAL | 0 refills | Status: DC

## 2018-07-27 MED FILL — MEGESTROL 40 MG TABLET: 40 | 30 days supply | Qty: 60 | Fill #1

## 2018-07-27 MED FILL — WARFARIN SODIUM 5 MG TABLET: 5 | 30 days supply | Qty: 60 | Fill #1

## 2018-07-27 NOTE — Addendum Note (Signed)
Addended by: Karle Plumber B on: 07/27/2018 06:05 PM   Modules accepted: Orders

## 2018-07-27 NOTE — Telephone Encounter (Signed)
Contacted pt to go over lab results pt is aware of results  Dr. Wynetta Emery pt states she had her parathyroid removed 10 years ago. Pt also stated that the rx can [facility] to [facility] on [address] (the pharmacy is in chart). Also pt wants to know are you going to send in another abx because she has already finished the abx that the hospital sent her home with

## 2018-07-28 ENCOUNTER — Telehealth: Payer: Self-pay | Admitting: Licensed Clinical Social Worker

## 2018-07-28 NOTE — Telephone Encounter (Signed)
MSW Intern contacted pt to follow-up on transportation resources and financial counseling. No answer, left message.

## 2018-07-28 NOTE — Telephone Encounter (Signed)
Contacted pt to go over Dr. Wynetta Emery repsonse pt is aware and doesn't have any questions or concerns

## 2018-07-29 LAB — CANNABINOID CONFIRMATION, UR
CANNABINOIDS: POSITIVE — AB
Carboxy THC GC/MS Conf: 82 ng/mL

## 2018-07-29 LAB — DRUG SCREEN 764883 11+OXYCO+ALC+CRT-BUND
Amphetamines, Urine: NEGATIVE ng/mL
BENZODIAZ UR QL: NEGATIVE ng/mL
Barbiturate: NEGATIVE ng/mL
Creatinine: 75.1 mg/dL (ref 20.0–300.0)
Ethanol: NEGATIVE %
Meperidine: NEGATIVE ng/mL
Methadone Screen, Urine: NEGATIVE ng/mL
OPIATE SCREEN URINE: NEGATIVE ng/mL
Oxycodone/Oxymorphone, Urine: NEGATIVE ng/mL
Phencyclidine: NEGATIVE ng/mL
Propoxyphene: NEGATIVE ng/mL
pH, Urine: 5.8 (ref 4.5–8.9)

## 2018-07-29 LAB — TRAMADOL GC/MS, URINE
Tramadol gc/ms Conf: 1090 ng/mL
Tramadol: POSITIVE — AB

## 2018-07-29 LAB — COCAINE CONF, UR
Benzoylecgonine GC/MS Conf: 4640 ng/mL
Cocaine Metab Quant, Ur: POSITIVE — AB

## 2018-07-31 ENCOUNTER — Telehealth: Payer: Self-pay

## 2018-07-31 NOTE — Telephone Encounter (Signed)
Contacted pt to go over urine drug screen results. Pt states she smoke Marijuana on a regular basis but she doesn't do cocaine unless it was in the marijuana.  Pt is aware of results and doesn't have any questions or concerns

## 2018-07-31 NOTE — Telephone Encounter (Signed)
-----   Message from Ladell Pier, MD sent at 07/30/2018 10:01 AM EDT ----- Urine drug screen positive for cocaine and marijuana.  Will discuss on f/u visit.

## 2018-08-04 ENCOUNTER — Ambulatory Visit: Payer: Self-pay | Attending: Internal Medicine | Admitting: Pharmacist

## 2018-08-04 DIAGNOSIS — Z7901 Long term (current) use of anticoagulants: Secondary | ICD-10-CM | POA: Insufficient documentation

## 2018-08-04 DIAGNOSIS — I749 Embolism and thrombosis of unspecified artery: Secondary | ICD-10-CM | POA: Insufficient documentation

## 2018-08-04 DIAGNOSIS — Z86718 Personal history of other venous thrombosis and embolism: Secondary | ICD-10-CM | POA: Insufficient documentation

## 2018-08-04 DIAGNOSIS — I745 Embolism and thrombosis of iliac artery: Secondary | ICD-10-CM | POA: Insufficient documentation

## 2018-08-04 LAB — POCT INR: INR: 3.2 — AB (ref 2.0–3.0)

## 2018-08-10 ENCOUNTER — Other Ambulatory Visit: Payer: Self-pay

## 2018-08-10 DIAGNOSIS — I779 Disorder of arteries and arterioles, unspecified: Secondary | ICD-10-CM

## 2018-08-10 DIAGNOSIS — I739 Peripheral vascular disease, unspecified: Secondary | ICD-10-CM

## 2018-08-11 ENCOUNTER — Encounter: Payer: Self-pay | Admitting: Pharmacist

## 2018-08-22 MED FILL — !NOVOLOG FLEXPEN SYRINGE 1: 100/ML | 26 days supply | Qty: 12 | Fill #0

## 2018-08-22 MED FILL — BUPROPION SR 150 MG TABLET: 150 | 30 days supply | Qty: 60 | Fill #0

## 2018-08-22 MED FILL — GABAPENTIN 400 MG CAPSULE: 400 | 30 days supply | Qty: 120 | Fill #0

## 2018-08-22 MED FILL — MEGESTROL 40 MG TABLET: 40 | 30 days supply | Qty: 60 | Fill #2

## 2018-08-22 MED FILL — WARFARIN SODIUM 5 MG TABLET: 5 | 30 days supply | Qty: 60 | Fill #2

## 2018-08-22 MED FILL — $LANTUS 100 UNITS/ML VIAL: 100 | 28 days supply | Qty: 20 | Fill #5

## 2018-08-22 MED FILL — ?OMEPRAZOLE 20 MG CAPSULE D: 20 | 30 days supply | Qty: 60 | Fill #0

## 2018-08-22 MED FILL — LISINOPRIL 5 MG TAB: 5 | 30 days supply | Qty: 15 | Fill #5

## 2018-08-25 ENCOUNTER — Ambulatory Visit (INDEPENDENT_AMBULATORY_CARE_PROVIDER_SITE_OTHER)
Admission: RE | Admit: 2018-08-25 | Discharge: 2018-08-25 | Disposition: A | Discharge status: Home / Self Care | Payer: Medicaid Other | Source: Ambulatory Visit | Attending: Vascular Surgery | Admitting: Vascular Surgery

## 2018-08-25 ENCOUNTER — Other Ambulatory Visit: Payer: Self-pay

## 2018-08-25 ENCOUNTER — Ambulatory Visit (INDEPENDENT_AMBULATORY_CARE_PROVIDER_SITE_OTHER): Payer: Self-pay | Admitting: Vascular Surgery

## 2018-08-25 ENCOUNTER — Encounter: Payer: Self-pay | Admitting: Vascular Surgery

## 2018-08-25 ENCOUNTER — Ambulatory Visit (HOSPITAL_COMMUNITY)
Admission: RE | Admit: 2018-08-25 | Discharge: 2018-08-25 | Disposition: A | Discharge status: Home / Self Care | Payer: Self-pay | Source: Ambulatory Visit | Attending: Vascular Surgery | Admitting: Vascular Surgery

## 2018-08-25 VITALS — BP 162/101 | HR 108 | Temp 97.2°F | Resp 16 | Ht 63.0 in | Wt 258.0 lb

## 2018-08-25 DIAGNOSIS — I739 Peripheral vascular disease, unspecified: Secondary | ICD-10-CM

## 2018-08-25 DIAGNOSIS — I779 Disorder of arteries and arterioles, unspecified: Secondary | ICD-10-CM

## 2018-08-25 NOTE — Progress Notes (Signed)
Patient ID: [name], female   DOB: Jul 26, 1976, 42 y.o.   MRN: [id number]  Reason for Consult: PVD   Referred by Ladell Pier, MD  Subjective:     HPI:  EMILEA GOGA is a 42 y.o. female follows up for lower extremity evaluation after having undergone percutaneous thrombectomy of her occluded left common external iliac artery stents.  States that she cannot really feel her left foot but in general she is doing okay from a left lower extremity standpoint.  She does have some right-sided pain in her foot as well.  She continues to smoke although states she is cut back.  She states that her diabetes is much better controlled.  She has multiple complaints about abdominal pain and bloating.  She also states that her hair is falling out.  She states that she has lost 18 pounds despite not trying.  She continues to smoke marijuana denies other illicit drugs although recently had a positive cocaine study she cannot explain this at this time.  She is having difficulty sleeping as she states that she is having financial issues as well.  Aortoiliac duplex performed prior to today's visit.  Past Medical History:  Diagnosis Date  . Anxiety   . Diabetes ([place])   . GERD (gastroesophageal reflux disease)   . History of blood clots   . Hypercalcemia   . Hyperparathyroidism   . Nephrolithiasis   . Renal calculi    Family History  Problem Relation Age of Onset  . Depression Mother   . Diabetes Father   . Heart failure Father    Past Surgical History:  Procedure Laterality Date  . ABDOMINAL AORTOGRAM W/LOWER EXTREMITY N/A 11/14/2017   Procedure: ABDOMINAL AORTOGRAM W/LOWER EXTREMITY;  Surgeon: Waynetta Sandy, MD;  Location: [facility];  Service: Cardiovascular;  Laterality: N/A;  . ANGIOPLASTY ILLIAC ARTERY Left 02/25/2018   Procedure: BALLOON ANGIOPLASTY LEFT ILIAC ARTERY;  Surgeon: Conrad Reading, MD;  Location: [facility];  Service: Vascular;  Laterality: Left;  .  APPLICATION OF WOUND VAC Left 03/03/2018   Procedure: APPLICATION OF WOUND VAC;  Surgeon: Angelia Mould, MD;  Location: [facility];  Service: Vascular;  Laterality: Left;  . CYSTOSCOPY W/ URETERAL STENT PLACEMENT Right 06/27/2018   Procedure: CYSTOSCOPY WITH RETROGRADE PYELOGRAM/URETERAL DOUBLE J STENT PLACEMENT;  Surgeon: Ceasar Mons, MD;  Location: [facility];  Service: Urology;  Laterality: Right;  . EMBOLECTOMY Left 02/24/2017   Procedure: Left Lower Extremity Embolectomy and Angiogram.;  Surgeon: Waynetta Sandy, MD;  Location: [facility];  Service: Vascular;  Laterality: Left;  . INSERTION OF ILIAC STENT  02/24/2017   Procedure: INSERTION OF Common ILIAC STENT;  Surgeon: Waynetta Sandy, MD;  Location: [place];  Service: Vascular;;  . INTRAOPERATIVE ARTERIOGRAM Left 02/25/2018   Procedure: INTRA OPERATIVE ARTERIOGRAM WITH LEFT LEG RUNOFF;  Surgeon: Conrad [place], MD;  Location: [facility];  Service: Vascular;  Laterality: Left;  . LOWER EXTREMITY ANGIOGRAM Left 02/24/2017   Procedure: Aortagram, Left lower extremity Run-off;  Surgeon: Waynetta Sandy, MD;  Location: [facility];  Service: Vascular;  Laterality: Left;  . PATCH ANGIOPLASTY Left 02/25/2018   Procedure: PATCH ANGIOPLASTY LEFT SUPERFICIAL FEMORAL ARTERY WITH BOVINE PATCH;  Surgeon: Conrad [place]ir, MD;  Location: [facility];  Service: Vascular;  Laterality: Left;  . PERCUTANEOUS VENOUS THROMBECTOMY,LYSIS WITH INTRAVASCULAR ULTRASOUND (IVUS) Left 05/12/2018   Procedure: MECHANICAL THROMBECTOMY LEFT LEG, BALLOON ANGIOPLASTY LEFT ANTERIOR TIBIAL ARTERY, AORTOGRAM WITH LEFT  LEG RUNOFF;  Surgeon: Serafina Mitchell, MD;  Location: [facility];  Service: Vascular;  Laterality: Left;  . 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 THROMBECTOMY FEMORAL ARTERY Left 02/25/2018   Procedure: LEFT ILIAC AND POPLITEAL ARTERY THROMBECTOMY;  Surgeon: Conrad [place], MD;  Location: [place];  Service: Vascular;  Laterality: Left;  . TUBAL LIGATION    .  WOUND DEBRIDEMENT Left 03/03/2018   Procedure: DEBRIDEMENT WOUND;  Surgeon: Angelia Mould, MD;  Location: [facility];  Service: Vascular;  Laterality: Left;  . WOUND EXPLORATION Left 03/03/2018   Procedure: WOUND EXPLORATION;  Surgeon: Angelia Mould, MD;  Location: [place];  Service: Vascular;  Laterality: Left;    Short Social History:  Social History   Tobacco Use  . Smoking status: Current Some Day Smoker    Packs/day: 0.25    Years: 15.00    Pack years: 3.75    Types: Cigarettes    Last attempt to quit: 03/04/2017    Years since quitting: 1.4  . Smokeless tobacco: Never Used  Substance Use Topics  . Alcohol use: No    Allergies  Allergen Reactions  . Ciprofloxacin Hcl Hives  . Macrobid [Nitrofurantoin Macrocrystal] Hives, Shortness Of Breath and Rash  . Other Hives, Shortness Of Breath and Rash    NO "-CILLINS"!!!  . Penicillins Shortness Of Breath    Had had cephalosporins without incident Has patient had a PCN reaction causing immediate rash, facial/tongue/throat swelling, SOB or lightheadedness with hypotension: Yes Has patient had a PCN reaction causing severe rash involving mucus membranes or skin necrosis: Unk Has patient had a PCN reaction that required hospitalization: Unk Has patient had a PCN reaction occurring within the last 10 years: No If all of the above answers are "NO", then may proceed with Cephalosporin use.   . Sulfa Antibiotics Hives, Shortness Of Breath and Rash  . Dilaudid [Hydromorphone Hcl] Other (See Comments)    Asystole per pt report  . Lexapro [Escitalopram Oxalate] Other (See Comments)    "I just did not like it."    Current Outpatient Medications  Medication Sig Dispense Refill  . acetaminophen (TYLENOL) 500 MG tablet Take 1,000 mg by mouth every 6 (six) hours as needed (for pain).     Marland Kitchen albuterol (PROAIR HFA) 108 (90 Base) MCG/ACT inhaler Inhale 2 puffs into the lungs every 6 (six) hours as needed for wheezing or shortness of  breath.    Marland Kitchen aspirin EC 81 MG tablet Take 81 mg by mouth daily.    . blood glucose meter kit and supplies Dispense based on patient and insurance preference. Use up to four times daily as directed. (FOR ICD-9 250.00, 250.01). 1 each 0  . Blood Glucose Monitoring Suppl (TRUE METRIX METER) w/Device KIT Use as directed 1 kit 0  . buPROPion (WELLBUTRIN SR) 150 MG 12 hr tablet TAKE 1 TABLET BY MOUTH 2 TIMES DAILY. 60 tablet 2  . dexamethasone (DECADRON) 1 MG tablet 1 tab PO at 11 p.m night before lab test. 1 tablet 0  . diphenhydrAMINE (BENADRYL) 25 MG tablet Take 25 mg by mouth every 6 (six) hours as needed for allergies.     Marland Kitchen docusate sodium (COLACE) 100 MG capsule Take 100 mg by mouth daily.    . ferrous sulfate 325 (65 FE) MG tablet Take 1 tablet (325 mg total) by mouth daily with breakfast. 100 tablet 1  . furosemide (LASIX) 40 MG tablet Take 1 tablet (40 mg total) by mouth  daily. 10 tablet 0  . gabapentin (NEURONTIN) 400 MG capsule Take 2 capsules (800 mg total) by mouth 2 (two) times daily. 120 capsule 3  . glucose blood (TRUE METRIX BLOOD GLUCOSE TEST) test strip Use as instructed 100 each 12  . glucose blood test strip Use as instructed 100 each 0  . insulin aspart (NOVOLOG FLEXPEN) 100 UNIT/ML FlexPen 5-15 units subcut TID with meals (Patient taking differently: Inject 5-15 Units into the skin 3 (three) times daily with meals. ) 15 mL 11  . Insulin Glargine (LANTUS SOLOSTAR) 100 UNIT/ML Solostar Pen Inject 70 Units into the skin daily. (Patient taking differently: Inject 70 Units into the skin at bedtime. ) 15 mL 11  . Insulin Pen Needle 31G X 5 MM MISC Use with Lantus and Novolog injections. 100 each 11  . Insulin Syringe-Needle U-100 (BD INSULIN SYRINGE ULTRAFINE) 31G X 5/16" 0.5 ML MISC Use as directed 100 each 3  . megestrol (MEGACE) 40 MG tablet Take 2 tablets (80 mg total) by mouth daily. 180 tablet 3  . nicotine (NICODERM CQ - DOSED IN MG/24 HOURS) 14 mg/24hr patch Place 1 patch (14 mg  total) onto the skin daily. 28 patch 1  . omeprazole (PRILOSEC) 20 MG capsule Take 2 capsules (40 mg total) by mouth daily. (Patient taking differently: Take 40 mg by mouth 2 (two) times daily as needed (for reflux). ) 60 capsule 2  . ondansetron (ZOFRAN) 4 MG tablet Take 1 tablet (4 mg total) by mouth every 8 (eight) hours as needed for nausea or vomiting. 20 tablet 0  . oxybutynin (DITROPAN) 5 MG tablet Take 1 tablet (5 mg total) by mouth every 8 (eight) hours as needed for bladder spasms. 30 tablet 1  . phenazopyridine (PYRIDIUM) 200 MG tablet Take 1 tablet (200 mg total) by mouth 3 (three) times daily as needed (for pain with urination). 30 tablet 0  . polyethylene glycol (MIRALAX) packet Take 17 g by mouth daily. 28 each 0  . sodium polystyrene (KAYEXALATE) 15 GM/60ML suspension Orally 30 gm daily dispense 1 month supply 500 mL 0  . traMADol (ULTRAM) 50 MG tablet Take 1 tablet (50 mg total) by mouth daily as needed (for pain). 30 tablet 1  . TRUEPLUS LANCETS 28G MISC Use as directed 100 each 6  . warfarin (COUMADIN) 5 MG tablet Take 1-2 tablets (5-10 mg total) by mouth See admin instructions. 5 mg Sun, Mon , wed,  Fri 10 mg true, Thurs sat    . cephALEXin (KEFLEX) 500 MG capsule Take 1 capsule (500 mg total) by mouth 3 (three) times daily. (Patient not taking: Reported on 08/25/2018) 21 capsule 0   No current facility-administered medications for this visit.     Review of Systems  Constitutional: Positive for fatigue and unexpected weight change.  HENT: HENT negative.  Eyes: Eyes negative.  Respiratory: Respiratory negative.  GI: Positive for abdominal pain.  Musculoskeletal: Positive for gait problem, leg pain and joint pain.  Skin: Skin negative.  Psychiatric: Psychiatric negative.        Objective:  Objective   Vitals:   08/25/18 1001  BP: (!) 162/101  Pulse: (!) 108  Resp: 16  Temp: (!) 97.2 F (36.2 C)  TempSrc: Oral  SpO2: 99%  Weight: 258 lb (117 kg)  Height: 5'  3" (1.6 m)   Body mass index is 45.7 kg/m.  Physical Exam  Constitutional: She is oriented to person, place, and time. She appears well-developed.  HENT:  Head:  Normocephalic.  Neck: Normal range of motion.  Cardiovascular: Normal rate.  Pulses:      Radial pulses are 2+ on the right side, and 2+ on the left side.  Strong signal distal dorsalis pedis artery on the left  Pulmonary/Chest: Effort normal.  Abdominal: Soft.  Musculoskeletal: Normal range of motion. She exhibits no deformity.  Neurological: She is alert and oriented to person, place, and time.  Skin: Skin is warm.  Psychiatric: She has a normal mood and affect. Judgment and thought content normal.    Data: I have independently interpreted her aortoiliac study which demonstrates triphasic waveforms throughout her right common external iliac arteries as well as in her left stent where there is no demonstrable stenosis.     Assessment/Plan:     42 year old female was previously undergone left lower extremity thromboembolectomy subsequently had a left groin wound that had to heal by secondary intention.  She recently clotted off her left common iliac artery stent this was percutaneously maximized.  She now has good signals in her foot everything appears well-perfused.  She is having multiple other issues I have encouraged her to discuss with her primary care doctor.  She will continue Coumadin and aspirin.  We have discussed smoking cessation.  She will follow-up with repeat ABIs and aortoiliac duplex in 6 months.     Waynetta Sandy MD [facility]

## 2018-08-29 ENCOUNTER — Ambulatory Visit: Payer: Self-pay | Admitting: Internal Medicine

## 2018-09-05 ENCOUNTER — Other Ambulatory Visit: Payer: Self-pay | Admitting: Internal Medicine

## 2018-09-05 DIAGNOSIS — F329 Major depressive disorder, single episode, unspecified: Secondary | ICD-10-CM

## 2018-09-05 DIAGNOSIS — F419 Anxiety disorder, unspecified: Secondary | ICD-10-CM

## 2018-09-05 DIAGNOSIS — F32A Depression, unspecified: Secondary | ICD-10-CM

## 2018-09-15 ENCOUNTER — Other Ambulatory Visit: Payer: Self-pay | Admitting: Internal Medicine

## 2018-09-15 DIAGNOSIS — G5792 Unspecified mononeuropathy of left lower limb: Secondary | ICD-10-CM

## 2018-09-15 DIAGNOSIS — Z79891 Long term (current) use of opiate analgesic: Secondary | ICD-10-CM

## 2018-09-15 NOTE — Telephone Encounter (Signed)
Pt last seen: 07/25/18 Next appt: n/a Last RX written on: 07/25/18 Date of original fill: 08/18/18 Date of refill(s):   No other controlled substances were filled during this time, please refill if appropriate.

## 2018-09-19 MED FILL — GABAPENTIN 400 MG CAPSULE: 400 | 30 days supply | Qty: 120 | Fill #1

## 2018-09-19 MED FILL — !NOVOLOG FLEXPEN SYRINGE 1: 100/ML | 26 days supply | Qty: 12 | Fill #1

## 2018-09-19 MED FILL — MEGESTROL 40 MG TABLET: 40 | 30 days supply | Qty: 60 | Fill #2

## 2018-09-19 MED FILL — $LANTUS 100 UNITS/ML VIAL: 100 | 77 days supply | Qty: 60 | Fill #0

## 2018-09-25 ENCOUNTER — Other Ambulatory Visit: Payer: Self-pay

## 2018-09-25 NOTE — Telephone Encounter (Signed)
Patient called Luke's direct extension, I answered since he was with a patient. Ms. Arenson was calling to inquire about her Hydroxyzine to see why it had been discontinued.   It was d/c'd on 07/05/18 and noted as 'stop taking at discharge'   The patient reports it is the only thing she can take that really helps her anxiety and she is having increased anxiety right now. I told her this may not be addressed by the end of the day but that we would do our best.

## 2018-09-28 MED ORDER — HYDROXYZINE HCL 25 MG PO TABS: ORAL_TABLET | ORAL | 0 refills | Status: DC

## 2018-09-28 MED FILL — hydrOXYzine HCL 25 MG TABS: 25 | 22 days supply | Qty: 90 | Fill #0

## 2018-09-28 NOTE — Telephone Encounter (Signed)
Refills sent to pharmacy. 

## 2018-09-29 ENCOUNTER — Other Ambulatory Visit: Payer: Self-pay

## 2018-09-29 ENCOUNTER — Encounter (HOSPITAL_COMMUNITY): Payer: Self-pay

## 2018-09-29 ENCOUNTER — Emergency Department (HOSPITAL_COMMUNITY)
Admission: EM | Admit: 2018-09-29 | Discharge: 2018-09-29 | Disposition: A | Discharge status: Home / Self Care | Payer: Medicaid Other | Attending: Emergency Medicine | Admitting: Emergency Medicine

## 2018-09-29 DIAGNOSIS — Z5321 Procedure and treatment not carried out due to patient leaving prior to being seen by health care provider: Secondary | ICD-10-CM | POA: Insufficient documentation

## 2018-09-29 DIAGNOSIS — R600 Localized edema: Secondary | ICD-10-CM | POA: Insufficient documentation

## 2018-09-29 LAB — URINALYSIS, ROUTINE W REFLEX MICROSCOPIC
Bilirubin Urine: NEGATIVE
Glucose, UA: >=500 mg/dL — AB
Ketones, ur: NEGATIVE mg/dL
Nitrite: NEGATIVE
Protein, ur: 30 mg/dL — AB
RBC / HPF: >50 RBC/hpf — ABNORMAL HIGH (ref 0–5)
Specific Gravity, Urine: 1.016 (ref 1.005–1.030)
WBC, UA: >50 WBC/hpf — ABNORMAL HIGH (ref 0–5)
pH: 6 (ref 5.0–8.0)

## 2018-09-29 LAB — CBC
HCT: 31.9 % — ABNORMAL LOW (ref 36.0–46.0)
Hemoglobin: 9.9 g/dL — ABNORMAL LOW (ref 12.0–15.0)
MCH: 29 pg (ref 26.0–34.0)
MCHC: 31 g/dL (ref 30.0–36.0)
MCV: 93.5 fL (ref 80.0–100.0)
Platelets: 368 10*3/uL (ref 150–400)
RBC: 3.41 MIL/uL — ABNORMAL LOW (ref 3.87–5.11)
RDW: 14 % (ref 11.5–15.5)
WBC: 12.9 10*3/uL — ABNORMAL HIGH (ref 4.0–10.5)
nRBC: 0 % (ref 0.0–0.2)

## 2018-09-29 LAB — BASIC METABOLIC PANEL
Anion gap: 12 (ref 5–15)
BUN: 17 mg/dL (ref 6–20)
CO2: 22 mmol/L (ref 22–32)
Calcium: 9.2 mg/dL (ref 8.9–10.3)
Chloride: 92 mmol/L — ABNORMAL LOW (ref 98–111)
Creatinine, Ser: 1.97 mg/dL — ABNORMAL HIGH (ref 0.44–1.00)
GFR calc Af Amer: 35 mL/min — ABNORMAL LOW (ref >60)
GFR calc non Af Amer: 30 mL/min — ABNORMAL LOW (ref >60)
Glucose, Bld: 668 mg/dL (ref 70–99)
Potassium: 3.8 mmol/L (ref 3.5–5.1)
Sodium: 126 mmol/L — ABNORMAL LOW (ref 135–145)

## 2018-09-29 LAB — I-STAT BETA HCG BLOOD, ED (MC, WL, AP ONLY): I-stat hCG, quantitative: <5 m[IU]/mL (ref <5)

## 2018-09-29 LAB — PROTIME-INR
INR: 2.38
Prothrombin Time: 25.7 seconds — ABNORMAL HIGH (ref 11.4–15.2)

## 2018-09-29 LAB — CBG MONITORING, ED: Glucose-Capillary: >600 mg/dL (ref 70–99)

## 2018-09-29 NOTE — ED Triage Notes (Signed)
Pulses dopplered in the left foot, palpable in the right.

## 2018-09-29 NOTE — ED Notes (Signed)
Pt came up to front desk to tell me she isn't waiting anymore and was leaving. Advised pt I don't recommend leaving. Pt still decided to leave. Band cut off.

## 2018-09-29 NOTE — ED Triage Notes (Signed)
Pt here for left leg swelling and leg pain.  Pt takes gabapentin. Pt tested over 600 cbg in triage.  A&Ox4.  Hx of diabetes, HTN and on blood thinners.

## 2018-10-02 ENCOUNTER — Ambulatory Visit: Payer: Self-pay | Admitting: Internal Medicine

## 2018-10-09 ENCOUNTER — Other Ambulatory Visit: Payer: Self-pay | Admitting: Internal Medicine

## 2018-10-09 DIAGNOSIS — R809 Proteinuria, unspecified: Secondary | ICD-10-CM

## 2018-10-17 ENCOUNTER — Other Ambulatory Visit: Payer: Self-pay | Admitting: Internal Medicine

## 2018-10-17 DIAGNOSIS — R809 Proteinuria, unspecified: Secondary | ICD-10-CM

## 2018-10-17 MED FILL — GABAPENTIN 400 MG CAPSULE: 400 | 30 days supply | Qty: 120 | Fill #2

## 2018-10-17 MED FILL — OMEPRAZOLE 20 MG CAP: 20 | 30 days supply | Qty: 60 | Fill #1

## 2018-10-17 MED FILL — WARFARIN SODIUM 5 MG TABLET: 5 | 30 days supply | Qty: 60 | Fill #2

## 2018-10-17 MED FILL — !VICTOZA 18MG/3ML INJECT: 18 | 28 days supply | Qty: 3 | Fill #2

## 2018-10-17 MED FILL — !NOVOLOG FLEXPEN SYRINGE 1: 100/ML | 26 days supply | Qty: 12 | Fill #2

## 2018-10-17 MED FILL — MEGESTROL 40 MG TABLET: 40 | 30 days supply | Qty: 60 | Fill #3

## 2018-10-17 MED FILL — BUPROPION SR 150 MG TABLET: 150 | 30 days supply | Qty: 60 | Fill #0

## 2018-10-17 MED FILL — hydrOXYzine HCL 25 MG TABS: 25 | 22 days supply | Qty: 90 | Fill #0

## 2018-10-19 ENCOUNTER — Other Ambulatory Visit: Payer: Self-pay | Admitting: Internal Medicine

## 2018-10-19 DIAGNOSIS — R809 Proteinuria, unspecified: Secondary | ICD-10-CM

## 2018-10-20 ENCOUNTER — Other Ambulatory Visit: Payer: Self-pay | Admitting: Internal Medicine

## 2018-10-20 DIAGNOSIS — IMO0001 Reserved for inherently not codable concepts without codable children: Secondary | ICD-10-CM

## 2018-10-20 DIAGNOSIS — E1165 Type 2 diabetes mellitus with hyperglycemia: Principal | ICD-10-CM

## 2018-10-20 DIAGNOSIS — Z794 Long term (current) use of insulin: Principal | ICD-10-CM

## 2018-11-20 ENCOUNTER — Other Ambulatory Visit: Payer: Self-pay | Admitting: Family Medicine

## 2018-11-20 MED FILL — !NOVOLOG FLEXPEN SYRINGE 1: 100/ML | 19 days supply | Qty: 9 | Fill #3

## 2018-11-20 MED FILL — !VICTOZA 18MG/3ML INJECT: 18 | 28 days supply | Qty: 3 | Fill #3

## 2018-11-20 MED FILL — GABAPENTIN 400 MG CAPSULE: 400 | 30 days supply | Qty: 120 | Fill #3

## 2018-11-20 MED FILL — MEGESTROL 40 MG TABLET: 40 | 30 days supply | Qty: 60 | Fill #4

## 2018-11-27 ENCOUNTER — Other Ambulatory Visit: Payer: Self-pay | Admitting: Family Medicine

## 2018-12-18 ENCOUNTER — Other Ambulatory Visit: Payer: Self-pay

## 2018-12-18 ENCOUNTER — Other Ambulatory Visit: Payer: Self-pay | Admitting: Pharmacist

## 2018-12-18 ENCOUNTER — Ambulatory Visit: Payer: Self-pay | Attending: Internal Medicine | Admitting: Internal Medicine

## 2018-12-18 ENCOUNTER — Ambulatory Visit: Payer: Self-pay | Admitting: Pharmacist

## 2018-12-18 DIAGNOSIS — I745 Embolism and thrombosis of iliac artery: Secondary | ICD-10-CM

## 2018-12-18 DIAGNOSIS — E1165 Type 2 diabetes mellitus with hyperglycemia: Secondary | ICD-10-CM

## 2018-12-18 DIAGNOSIS — Z794 Long term (current) use of insulin: Secondary | ICD-10-CM

## 2018-12-18 DIAGNOSIS — K219 Gastro-esophageal reflux disease without esophagitis: Secondary | ICD-10-CM

## 2018-12-18 DIAGNOSIS — F17200 Nicotine dependence, unspecified, uncomplicated: Secondary | ICD-10-CM

## 2018-12-18 DIAGNOSIS — IMO0001 Reserved for inherently not codable concepts without codable children: Secondary | ICD-10-CM

## 2018-12-18 DIAGNOSIS — I749 Embolism and thrombosis of unspecified artery: Secondary | ICD-10-CM

## 2018-12-18 LAB — POCT INR: INR: 2.2 (ref 2.0–3.0)

## 2018-12-18 MED ORDER — ALBUTEROL SULFATE HFA 108 (90 BASE) MCG/ACT IN AERS: 2.0000 | INHALATION_SPRAY | Freq: Four times a day (QID) | RESPIRATORY_TRACT | 0 refills | Status: DC | PRN

## 2018-12-18 MED ORDER — OMEPRAZOLE 20 MG PO CPDR: 40.0000 mg | DELAYED_RELEASE_CAPSULE | Freq: Every day | ORAL | 0 refills | Status: DC

## 2018-12-18 MED ORDER — HYDROXYZINE HCL 25 MG PO TABS: ORAL_TABLET | ORAL | 0 refills | Status: DC

## 2018-12-18 MED ORDER — WARFARIN SODIUM 5 MG PO TABS: ORAL_TABLET | ORAL | 0 refills | Status: DC

## 2018-12-18 MED ORDER — BUPROPION HCL ER (SR) 150 MG PO TB12: 150.0000 mg | ORAL_TABLET | Freq: Two times a day (BID) | ORAL | 0 refills | Status: DC

## 2018-12-18 MED ORDER — INSULIN PEN NEEDLE 31G X 5 MM MISC: 11 refills | Status: DC

## 2018-12-18 MED ORDER — INSULIN GLARGINE 100 UNIT/ML SOLOSTAR PEN: 70.0000 [IU] | PEN_INJECTOR | Freq: Every day | SUBCUTANEOUS | 0 refills | Status: DC

## 2018-12-18 NOTE — Progress Notes (Signed)
Pt requesting refills for maintenance medication and has scheduled appointment for 01/08/19. Will give 30-day supplies of her maintenance medications previously prescribed by Dr. Wynetta Emery with the exception of gabapentin and tramadol.

## 2018-12-19 MED FILL — WARFARIN SODIUM 5 MG TABLET: 5 | 30 days supply | Qty: 60 | Fill #3

## 2018-12-25 ENCOUNTER — Ambulatory Visit: Payer: Self-pay

## 2018-12-25 MED FILL — !LANTUS SOLOSTAR 100UNITS/M: 100 | 21 days supply | Qty: 15 | Fill #0

## 2018-12-25 MED FILL — NOVOLOG FLEXPEN SYRINGE: 100 | 19 days supply | Qty: 9 | Fill #4

## 2018-12-26 MED FILL — AMOX-CLAV 500-125 MG TABLET: 500-125 | 14 days supply | Qty: 28 | Fill #0

## 2018-12-29 MED FILL — BUPROPION SR 150 MG TABLET: 150 | 30 days supply | Qty: 60 | Fill #0

## 2018-12-29 MED FILL — hydrOXYzine HCL 25 MG TABS: 25 | 18 days supply | Qty: 90 | Fill #0

## 2018-12-29 MED FILL — MEGESTROL 40 MG TABLET: 40 | 30 days supply | Qty: 60 | Fill #5

## 2019-01-02 DIAGNOSIS — Z0279 Encounter for issue of other medical certificate: Secondary | ICD-10-CM

## 2019-01-08 ENCOUNTER — Ambulatory Visit: Payer: Self-pay | Admitting: Internal Medicine

## 2019-01-08 ENCOUNTER — Ambulatory Visit: Payer: Self-pay | Attending: Internal Medicine | Admitting: Internal Medicine

## 2019-01-08 ENCOUNTER — Other Ambulatory Visit: Payer: Self-pay | Admitting: Urology

## 2019-01-08 ENCOUNTER — Other Ambulatory Visit: Payer: Self-pay

## 2019-01-08 DIAGNOSIS — F419 Anxiety disorder, unspecified: Secondary | ICD-10-CM

## 2019-01-08 DIAGNOSIS — E279 Disorder of adrenal gland, unspecified: Secondary | ICD-10-CM

## 2019-01-08 DIAGNOSIS — E278 Other specified disorders of adrenal gland: Secondary | ICD-10-CM

## 2019-01-08 DIAGNOSIS — N289 Disorder of kidney and ureter, unspecified: Secondary | ICD-10-CM

## 2019-01-08 DIAGNOSIS — E1121 Type 2 diabetes mellitus with diabetic nephropathy: Secondary | ICD-10-CM

## 2019-01-08 DIAGNOSIS — N202 Calculus of kidney with calculus of ureter: Secondary | ICD-10-CM

## 2019-01-08 DIAGNOSIS — N1330 Unspecified hydronephrosis: Secondary | ICD-10-CM

## 2019-01-08 DIAGNOSIS — G5792 Unspecified mononeuropathy of left lower limb: Secondary | ICD-10-CM

## 2019-01-08 DIAGNOSIS — E1165 Type 2 diabetes mellitus with hyperglycemia: Secondary | ICD-10-CM

## 2019-01-08 DIAGNOSIS — IMO0002 Reserved for concepts with insufficient information to code with codable children: Secondary | ICD-10-CM

## 2019-01-08 MED ORDER — GABAPENTIN 400 MG PO CAPS: 800.0000 mg | ORAL_CAPSULE | Freq: Two times a day (BID) | ORAL | 3 refills | Status: DC

## 2019-01-08 MED ORDER — SERTRALINE HCL 50 MG PO TABS: ORAL_TABLET | ORAL | 3 refills | Status: DC

## 2019-01-08 MED FILL — GABAPENTIN 400 MG CAPSULE: 400 | 30 days supply | Qty: 120 | Fill #0

## 2019-01-08 MED FILL — SERTRALINE HCL 50 MG TABS: 50 | 30 days supply | Qty: 30 | Fill #0

## 2019-01-08 NOTE — Progress Notes (Signed)
Virtual Visit via Telephone Note  I connected with [name] on 01/08/19 at 12:06 p.m by telephone from my office and verified that I am speaking with the correct person using two identifiers. Pt is in vehicle.  Just the patient and myself were on this call.   I discussed the limitations, risks, security and privacy concerns of performing an evaluation and management service by telephone and the availability of in person appointments. I also discussed with the patient that there may be a patient responsible charge related to this service. The patient expressed understanding and agreed to proceed.   History of Present Illness: 43 year old female with history ofDM type2 with microalbumin, tob dep,PVDwith hx LLEacute limb ischemia requiring stent to the iliac artery and embolectomy followed by Dr. Zenia Resides vascular surgery, hyperparathyroidism s/p resection of parathryroid adenoma 2010, GERD,IDA,and anxiety/dep   DM: checking BS QID.  Gives range 125-190. Morning BS usually in the 160s.  Occasional elev to 267 Taking Lantus 70 units at nights and Novolo 5-15 units TID.  Wants to know if she can restart Victozia as it seems her stomach issues have resolved. Eating habits: dec appetite.  Wgh down to 256 lbs in November.  Try to get in as much exercise as she can.  Walking about 30 mins a day.  -Patient has microalbumin urea.  Lisinopril was placed on hold after last hospitalization in September of last year.  Patient wondering should she get back on it.  Living situation has changed.  Both she and her boyfriend having been living with hr daughter and grandson because boyfriend working less hrs.  This has been a stressful situation for her.  Some depression "just because I'm in the house all the time" due to [place].  States she feels much better when she gets out and walk.  She takes hydroxyzine 3 times a day as needed for anxiety but feels she needs something else.  She was wondering about  getting some Ativan to use as needed with the hydroxyzine.  Right hydronephrosis: After she saw me in October, she did not get back to urology to have stent removed due to lack of finances.  She finally saw them last month.  They plan to do a CT scan next month before deciding whether to remove the stent.  Has CT scan 02/07/2019  Adrenal nodule: Had sent a prescription for dexamethasone to an outside pharmacy per her request after last visit with me so that she can come in and have dexamethasone suppression test done.  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  Requests refill on gabapentin.  She is wondering about having a refill on tramadol.  I had hold off on refilling that after urine drug screen was positive for marijuana and cocaine.  Patient denies any cocaine use but states she does use marijuana. Observations/Objective: No direct observations done as this was a telephone encounter  Assessment and Plan: 1. 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  Goal for morning blood sugars before breakfast is 90-130.  Advised to increase Lantus to 73 units daily. Encouraged her to continue regular exercise.  Commended her on weight loss so far. Encouraged her to continue healthy eating habits - Microalbumin / creatinine urine ratio; Future  2. Neuropathy of left lower extremity - gabapentin (NEURONTIN)  400 MG capsule; Take 2 capsules (800 mg total) by mouth 2 (two) times daily.  Dispense: 120 capsule; Refill: 3  3. Adrenal nodule ([place]) Patient will have lab done for cortisol and metanephrine at the same time when she comes to the lab next week to give a urine microalbumin - Cortisol - AM; Future - Metanephrines, plasma; Future  4. Anxiety Continue hydroxyzine as needed.  We discussed the  using an SSRI to take daily to help decrease her symptoms rather than using benzodiazepine which can be habit-forming.  Patient was agreeable to this.  We plan to use Zoloft.  I went over with her possible side effects and how to take the medication - sertraline (ZOLOFT) 50 MG tablet; 1/2 tab daily x 2 wks then 1 tab PO daily  Dispense: 30 tablet; Refill: 3  5. Renal insufficiency - Basic Metabolic Panel; Future  6. Hydronephrosis, unspecified hydronephrosis type Continue follow-up with urology.  7.  Positive urine drug screen Inform patient that cocaine in the urine is rarely a false positive.  Encouraged her to seek treatment.  Will not be inclined to prescribe tramadol until she has had at least 3- urine drug screens.  Patient expressed understanding   Follow Up Instructions: Follow-up again in 2 months   I discussed the assessment and treatment plan with the patient. The patient was provided an opportunity to ask questions and all were answered. The patient agreed with the plan and demonstrated an understanding of the instructions.   The patient was advised to call back or seek an in-person evaluation if the symptoms worsen or if the condition fails to improve as anticipated.  I provided 23 minutes of non-face-to-face time during this encounter.   Karle Plumber, MD

## 2019-01-08 NOTE — Progress Notes (Signed)
Pt is needing a refill on victoza but don't see in current medication list

## 2019-01-09 MED FILL — GABAPENTIN 400 MG CAPSULE: 400 | 30 days supply | Qty: 120 | Fill #1

## 2019-01-10 ENCOUNTER — Ambulatory Visit: Payer: Self-pay

## 2019-01-15 ENCOUNTER — Other Ambulatory Visit: Payer: Self-pay | Admitting: Family Medicine

## 2019-01-15 MED FILL — WARFARIN SODIUM 5 MG TABLET: 5 | 30 days supply | Qty: 60 | Fill #0

## 2019-01-16 MED FILL — TRUE METRIX TEST STRIP: 30 days supply | Qty: 100 | Fill #0

## 2019-02-07 ENCOUNTER — Ambulatory Visit (HOSPITAL_COMMUNITY)
Admission: RE | Admit: 2019-02-07 | Discharge: 2019-02-07 | Disposition: A | Discharge status: Home / Self Care | Payer: Self-pay | Source: Ambulatory Visit | Attending: Urology | Admitting: Urology

## 2019-02-07 ENCOUNTER — Ambulatory Visit (HOSPITAL_COMMUNITY): Admission: RE | Admit: 2019-02-07 | Payer: PRIVATE HEALTH INSURANCE | Source: Ambulatory Visit

## 2019-02-07 ENCOUNTER — Ambulatory Visit (HOSPITAL_COMMUNITY): Payer: Medicaid Other

## 2019-02-07 ENCOUNTER — Encounter (HOSPITAL_COMMUNITY): Payer: Self-pay

## 2019-02-07 ENCOUNTER — Other Ambulatory Visit: Payer: Self-pay

## 2019-02-07 DIAGNOSIS — N202 Calculus of kidney with calculus of ureter: Secondary | ICD-10-CM | POA: Insufficient documentation

## 2019-02-16 ENCOUNTER — Telehealth: Payer: Self-pay

## 2019-02-16 ENCOUNTER — Telehealth: Payer: Self-pay | Admitting: Internal Medicine

## 2019-02-16 NOTE — Telephone Encounter (Signed)
Phone call returned to Dr. Gerilyn Nestle from [facility] urology clinic.  They have seen the patient recently and plan to do cystoscopy with stent removal from the right ureter.  The stent has been in over 7 months and they anticipate that they will need to do a uteroscope and encounter some difficulty in doing it because of the length of time that the stent was not placed.  We will placed on antibiotics and would like to do the procedure next Wednesday.  She wanted to touch base with me about management of the patient's Coumadin.  They would like for her to be off of it. 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  I did touch base with the patient also.  She has not seen our clinical pharmacist in a while for INR check.  I told her that it would probably be wise for [name] to bridge with Lovenox when she is off  Coumadin then have her hold the Lovenox the day before her procedure.  However patient states that she is not able to afford the Lovenox.  Our clinical pharmacist has left already for today.  I will touch base with him on Monday to see whether we are able to get the Lovenox for her.  I told patient to stop the Coumadin on Sunday night which is 02/18/2019.  II told Dr. Gerilyn Nestle that it would probably be better to push the procedure back to Thursday or Friday of next week. I also sent an box message to a vascular surgeon Dr. Donzetta Matters asking his opinion.

## 2019-02-16 NOTE — Telephone Encounter (Signed)
Dr Carlean Purl called and wanted to get a clearance from Dr Donzetta Matters for patient to stop coumadin for surgery next week.   Looked through chart and spoke with Zigmund Daniel and we are not managing her coumadin, she is having that done with [place] [facility] and [facility].   Left message advising Dr Carlean Purl of this and asked her to call if she had further questions.   York Cerise, CMA

## 2019-02-16 NOTE — Telephone Encounter (Signed)
Dr.geffner called in wanting to ask about the COUMADIN Please follow up

## 2019-02-19 ENCOUNTER — Other Ambulatory Visit: Payer: Self-pay | Admitting: Urology

## 2019-02-19 ENCOUNTER — Other Ambulatory Visit: Payer: Self-pay | Admitting: Pharmacist

## 2019-02-19 ENCOUNTER — Telehealth: Payer: Self-pay

## 2019-02-19 ENCOUNTER — Telehealth: Payer: Self-pay | Admitting: Internal Medicine

## 2019-02-19 MED ORDER — ENOXAPARIN SODIUM 120 MG/0.8ML ~~LOC~~ SOLN: 120.0000 mg | Freq: Two times a day (BID) | SUBCUTANEOUS | 0 refills | Status: DC

## 2019-02-19 NOTE — Telephone Encounter (Signed)
Dr Carlean Purl called again today regarding clearance for pt to be off of coumadin for a procedure this week. Advised her that per the chart note that PCP was to make the decision since they are monitoring as to whether the hold was appropriate per [facility]. Dr Carlean Purl wanted to know if a Lovenox bridge was necessary as well and advised her that looking at Dr Durenda Age notes, this was already in process with their office to try and get Lovenox for the patient. They had sent a message to Dr Donzetta Matters and he will respond accordingly when he is out of surgery.   York Cerise, CMA

## 2019-02-19 NOTE — Telephone Encounter (Addendum)
Call placed x2 with no answer. On the third try, I left a HIPAA-compliant VM instructing pt to return my call. Called patient's son Terri Sparks) and left instructions for him to have her call me back. He confirmed that he would let the patient know.   Pt has upcoming procedure requiring Lovenox-Coumadin bridge. Discussed with Dr. Wynetta Emery. Therapeutic twice daily Lovenox recommended d/t patients recurrent VTE.   Most recent weight was 258 lbs = ~117 kg. Terri Sparks of [facility] pharmacy says we can get the 120 mg syringes. Pt would inject 1 syringe BID. I have attached and pended the rx for review to route to Dr. Wynetta Emery.   Addendum: Pt returned call. Pt is understanding and amenable to the plan. I have scheduled her for an INR check Wednesday at 4:00 PM and she can pick up her Lovenox then as well.

## 2019-02-19 NOTE — Telephone Encounter (Signed)
Spoke with our clinical pharmacist today.  We will work on getting Lovenox for pt to use perioperatively.  She will come in in the next 1-2 days to have INR checked. Pt started holding Coumadin last night.  I spoke with urology resident filling in for Dr. Gerilyn Nestle today.  Told him that it would be better if procedure can be done on Thursday.  He said if it can not be done on Wednesday, they will have to do it on Friday.  He plans to put pt on schedule and call her today to let her know that procedure planned for Friday.  I called pt this evening and left this info on her VM.

## 2019-02-19 NOTE — Patient Instructions (Signed)
Terri Sparks    Your procedure is scheduled on: 09-25-2019  Report to [facility] Main  Entrance  Report to admitting at 1000 AM   YOU NEED TO HAVE A COVID 19 TEST ON TODAY. THIS TEST MUST BE DONE BEFORE SURGERY, COME TO [place] ENTRANCE   Call this number if you have problems the morning of surgery [phone]   Remember: Do not eat food or drink liquids :After Midnight. BRUSH YOUR TEETH MORNING OF SURGERY AND RINSE YOUR MOUTH OUT, NO CHEWING GUM CANDY OR MINTS.     Take these medicines the morning of surgery with A SIP OF WATER:  DO NOT TAKE ANY DIABETIC MEDICATIONS DAY OF YOUR SURGERY                               You may not have any metal on your body including hair pins and              piercings  Do not wear jewelry, make-up, lotions, powders or perfumes, deodorant             Do not wear nail polish.  Do not shave  48 hours prior to surgery.                 Do not bring valuables to the hospital. [place].  Contacts, dentures or bridgework may not be worn into surgery.  Leave suitcase in the car. After surgery it may be brought to your room.     Patients discharged the day of surgery will not be allowed to drive home. IF YOU ARE HAVING SURGERY AND GOING HOME THE SAME DAY, YOU MUST HAVE AN ADULT TO DRIVE YOU HOME AND BE WITH YOU FOR 24 HOURS. YOU MAY GO HOME BY TAXI OR UBER OR ORTHERWISE, BUT AN ADULT MUST ACCOMPANY YOU HOME AND STAY WITH YOU FOR 24 HOURS.  Name and phone number of your driver:  Special Instructions: N/A              Please read over the following fact sheets you were given: _____________________________________________________________________             [facility] - Preparing for Surgery Before surgery, you can play an important role.  Because skin is not sterile, your skin needs to be as free of germs as possible.  You can reduce the number of  germs on your skin by washing with CHG (chlorahexidine gluconate) soap before surgery.  CHG is an antiseptic cleaner which kills germs and bonds with the skin to continue killing germs even after washing. Please DO NOT use if you have an allergy to CHG or antibacterial soaps.  If your skin becomes reddened/irritated stop using the CHG and inform your nurse when you arrive at Short Stay. Do not shave (including legs and underarms) for at least 48 hours prior to the first CHG shower.  You may shave your face/neck. Please follow these instructions carefully:  1.  Shower with CHG Soap the night before surgery and the  morning of Surgery.  2.  If you choose to wash your hair, wash your hair first as usual with your  normal  shampoo.  3.  After you shampoo,  rinse your hair and body thoroughly to remove the  shampoo.                           4.  Use CHG as you would any other liquid soap.  You can apply chg directly  to the skin and wash                       Gently with a scrungie or clean washcloth.  5.  Apply the CHG Soap to your body ONLY FROM THE NECK DOWN.   Do not use on face/ open                           Wound or open sores. Avoid contact with eyes, ears mouth and genitals (private parts).                       Wash face,  Genitals (private parts) with your normal soap.             6.  Wash thoroughly, paying special attention to the area where your surgery  will be performed.  7.  Thoroughly rinse your body with warm water from the neck down.  8.  DO NOT shower/wash with your normal soap after using and rinsing off  the CHG Soap.                9.  Pat yourself dry with a clean towel.            10.  Wear clean pajamas.            11.  Place clean sheets on your bed the night of your first shower and do not  sleep with pets. Day of Surgery : Do not apply any lotions/deodorants the morning of surgery.  Please wear clean clothes to the hospital/surgery center.  FAILURE TO FOLLOW THESE  INSTRUCTIONS MAY RESULT IN THE CANCELLATION OF YOUR SURGERY PATIENT SIGNATURE_________________________________  NURSE SIGNATURE__________________________________  ________________________________________________________________________

## 2019-02-20 ENCOUNTER — Ambulatory Visit: Payer: Self-pay | Attending: Internal Medicine | Admitting: Pharmacist

## 2019-02-20 ENCOUNTER — Inpatient Hospital Stay (HOSPITAL_COMMUNITY): Admission: RE | Admit: 2019-02-20 | Payer: Medicaid Other | Source: Ambulatory Visit

## 2019-02-20 ENCOUNTER — Other Ambulatory Visit: Payer: Self-pay | Admitting: Urology

## 2019-02-20 ENCOUNTER — Encounter (HOSPITAL_COMMUNITY)
Admission: RE | Admit: 2019-02-20 | Discharge: 2019-02-20 | Disposition: A | Discharge status: Home / Self Care | Payer: Self-pay | Source: Ambulatory Visit | Attending: Urology | Admitting: Urology

## 2019-02-20 ENCOUNTER — Other Ambulatory Visit: Payer: Self-pay | Admitting: Internal Medicine

## 2019-02-20 ENCOUNTER — Other Ambulatory Visit: Payer: Self-pay

## 2019-02-20 ENCOUNTER — Other Ambulatory Visit (HOSPITAL_COMMUNITY)
Admission: RE | Admit: 2019-02-20 | Discharge: 2019-02-20 | Disposition: A | Discharge status: Home / Self Care | Payer: Medicaid Other | Source: Ambulatory Visit | Attending: Urology | Admitting: Urology

## 2019-02-20 DIAGNOSIS — I745 Embolism and thrombosis of iliac artery: Secondary | ICD-10-CM

## 2019-02-20 DIAGNOSIS — Z1159 Encounter for screening for other viral diseases: Secondary | ICD-10-CM | POA: Insufficient documentation

## 2019-02-20 DIAGNOSIS — I749 Embolism and thrombosis of unspecified artery: Secondary | ICD-10-CM

## 2019-02-20 LAB — POCT INR: INR: 1.4 — AB (ref 2.0–3.0)

## 2019-02-20 MED FILL — $LANTUS 100 UNITS/ML VIAL: 100 | 77 days supply | Qty: 60 | Fill #1

## 2019-02-20 MED FILL — GABAPENTIN 400 MG CAPSULE: 400 | 30 days supply | Qty: 120 | Fill #2

## 2019-02-20 MED FILL — SERTRALINE HCL 50 MG TABS: 50 | 30 days supply | Qty: 30 | Fill #1

## 2019-02-20 MED FILL — hydrOXYzine HCL 25 MG TABS: 25 | 30 days supply | Qty: 120 | Fill #0

## 2019-02-20 MED FILL — MEGESTROL 40 MG TABLET: 40 | 30 days supply | Qty: 60 | Fill #6

## 2019-02-20 MED FILL — !LOVENOX 120 MG PREFILLED S: 120 | 10 days supply | Qty: 20 | Fill #0

## 2019-02-20 MED FILL — BUPROPION SR 150 MG TABLET: 150 | 30 days supply | Qty: 60 | Fill #1

## 2019-02-20 MED FILL — !NOVOLOG FLEXPEN SYRINGE 1: 100/ML | 19 days supply | Qty: 9 | Fill #5

## 2019-02-20 NOTE — Progress Notes (Signed)
LM on VM for patient to call back in am to go over Covid 19 questions.

## 2019-02-20 NOTE — Patient Instructions (Addendum)
Terri Sparks  02/20/2019   Your procedure is scheduled on: Friday 02/23/2019  Report to [facility] Main  Entrance              Report to admitting at  1000 AM     Call this number if you have problems the morning of surgery [phone]    How to Manage Your Diabetes Before and After Surgery  Why is it important to control my blood sugar before and after surgery? . Improving blood sugar levels before and after surgery helps healing and can limit problems. . A way of improving blood sugar control is eating a healthy diet by: o  Eating less sugar and carbohydrates o  Increasing activity/exercise o  Talking with your doctor about reaching your blood sugar goals . High blood sugars (greater than 180 mg/dL) can raise your risk of infections and slow your recovery, so you will need to focus on controlling your diabetes during the weeks before surgery. . Make sure that the doctor who takes care of your diabetes knows about your planned surgery including the date and location.  How do I manage my blood sugar before surgery? . Check your blood sugar at least 4 times a day, starting 2 days before surgery, to make sure that the level is not too high or low. o Check your blood sugar the morning of your surgery when you wake up and every 2 hours until you get to the Short Stay unit. . If your blood sugar is less than 70 mg/dL, you will need to treat for low blood sugar: o Do not take insulin. o Treat a low blood sugar (less than 70 mg/dL) with  cup of clear juice (cranberry or apple), 4 glucose tablets, OR glucose gel. o Recheck blood sugar in 15 minutes after treatment (to make sure it is greater than 70 mg/dL). If your blood sugar is not greater than 70 mg/dL on recheck, call [phone] for further instructions. . Report your blood sugar to the short stay nurse when you get to Short Stay.  . If you are admitted to the hospital after surgery: o Your blood sugar  will be checked by the staff and you will probably be given insulin after surgery (instead of oral diabetes medicines) to make sure you have good blood sugar levels. o The goal for blood sugar control after surgery is 80-180 mg/dL.   WHAT DO I DO ABOUT MY DIABETES MEDICATION?  Marland Kitchen Do not take oral diabetes medicines (pills) the morning of surgery.  . THE NIGHT BEFORE SURGERY, take     units of       insulin.       . THE MORNING OF SURGERY, take   units of         insulin.  . The day of surgery, do not take other diabetes injectables, including Byetta (exenatide), Bydureon (exenatide ER), Victoza (liraglutide), or Trulicity (dulaglutide).  . If your CBG is greater than 220 mg/dL, you may take  of your sliding scale  . (correction) dose of insulin.       Remember: Do not eat food or drink liquids :After Midnight.                BRUSH YOUR TEETH MORNING OF SURGERY AND RINSE YOUR MOUTH OUT, NO CHEWING GUM CANDY OR MINTS.     Take these medicines the morning of  surgery with A SIP OF WATER:Bupropion (Wellbutrin SR), Gabapentin (Neurontin), Omeprazole (Prilosec), Sertraline (Zoloft)                  DO NOT TAKE ANY DIABETIC MEDICATIONS DAY OF YOUR SURGERY!                               You may not have any metal on your body including hair pins and              piercings  Do not wear jewelry, make-up, lotions, powders or perfumes, deodorant             Do not wear nail polish.  Do not shave  48 hours prior to surgery.             Do not bring valuables to the hospital. [place].  Contacts, dentures or bridgework may not be worn into surgery.  Leave suitcase in the car. After surgery it may be brought to your room.     Patients discharged the day of surgery will not be allowed to drive home. IF YOU ARE HAVING SURGERY AND GOING HOME THE SAME DAY, YOU MUST HAVE AN ADULT TO DRIVE YOU HOME AND BE WITH YOU FOR 24 HOURS. YOU MAY GO HOME BY  TAXI OR UBER OR ORTHERWISE, BUT AN ADULT MUST ACCOMPANY YOU HOME AND STAY WITH YOU FOR 24 HOURS.  Name and phone number of your driver:                Please read over the following fact sheets you were given: _____________________________________________________________________             [facility] - Preparing for Surgery Before surgery, you can play an important role.  Because skin is not sterile, your skin needs to be as free of germs as possible.  You can reduce the number of germs on your skin by washing with CHG (chlorahexidine gluconate) soap before surgery.  CHG is an antiseptic cleaner which kills germs and bonds with the skin to continue killing germs even after washing. Please DO NOT use if you have an allergy to CHG or antibacterial soaps.  If your skin becomes reddened/irritated stop using the CHG and inform your nurse when you arrive at Short Stay. Do not shave (including legs and underarms) for at least 48 hours prior to the first CHG shower.  You may shave your face/neck. Please follow these instructions carefully:  1.  Shower with CHG Soap the night before surgery and the  morning of Surgery.  2.  If you choose to wash your hair, wash your hair first as usual with your  normal  shampoo.  3.  After you shampoo, rinse your hair and body thoroughly to remove the  shampoo.                           4.  Use CHG as you would any other liquid soap.  You can apply chg directly  to the skin and wash                       Gently with a scrungie or clean washcloth.  5.  Apply the CHG Soap to your body ONLY FROM THE NECK  DOWN.   Do not use on face/ open                           Wound or open sores. Avoid contact with eyes, ears mouth and genitals (private parts).                       Wash face,  Genitals (private parts) with your normal soap.             6.  Wash thoroughly, paying special attention to the area where your surgery  will be performed.  7.  Thoroughly rinse your body  with warm water from the neck down.  8.  DO NOT shower/wash with your normal soap after using and rinsing off  the CHG Soap.                9.  Pat yourself dry with a clean towel.            10.  Wear clean pajamas.            11.  Place clean sheets on your bed the night of your first shower and do not  sleep with pets. Day of Surgery : Do not apply any lotions/deodorants the morning of surgery.  Please wear clean clothes to the hospital/surgery center.  FAILURE TO FOLLOW THESE INSTRUCTIONS MAY RESULT IN THE CANCELLATION OF YOUR SURGERY PATIENT SIGNATURE_________________________________  NURSE SIGNATURE__________________________________  ________________________________________________________________________

## 2019-02-20 NOTE — Progress Notes (Signed)
Consulted w/ Dr Wynetta Emery regarding perioperative management of pt's anticoagulation.   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 Pt has been holding Coumadin since Sunday (5/17) and her INR is reflective of that today (1.4).   She is to commence therapeutic Lovenox 120 mg BID. She will administer as follows:  1. 120 mg tonight  2. 120 mg tomorrow morning, 120 mg tomorrow evening 3. 120 mg Thursday morning; NO LOVENOX Thursday EVENING.  4. No Lovenox the day of the procedure.  Her Urologist will advise pt when to restart warfarin. Pt understands and is amenable to restarting warfarin at her previous schedule of 10 mg Tues, Thurs, Sat with 5 mg all other days once deemed appropriate by the Urologist. Pt also understands that she is to administer concurrent Lovenox 120 mg BID until her INR, post-procedure, is therapeutic.   Benard Halsted, PharmD, [place] [phone]

## 2019-02-20 NOTE — Progress Notes (Addendum)
02/20/2019- noted in [place] and note from Pharmacist in [place] with Lovenox instructions  06/27/2018- noted in [place]  06/26/2018- noted in Epic-ECHO, CXR, CT angio chest/abd./pelvis for dissection

## 2019-02-21 ENCOUNTER — Encounter: Payer: Medicaid Other | Admitting: Pharmacist

## 2019-02-21 ENCOUNTER — Inpatient Hospital Stay (HOSPITAL_COMMUNITY)
Admission: RE | Admit: 2019-02-21 | Discharge: 2019-02-21 | Disposition: A | Discharge status: Home / Self Care | Payer: Medicaid Other | Source: Ambulatory Visit

## 2019-02-21 LAB — NOVEL CORONAVIRUS, NAA (HOSP ORDER, SEND-OUT TO REF LAB; TAT 18-24 HRS): SARS-CoV-2, NAA: NOT DETECTED

## 2019-02-21 SURGERY — Surgical Case
Anesthesia: *Unknown

## 2019-02-22 ENCOUNTER — Encounter (HOSPITAL_COMMUNITY)
Admission: RE | Admit: 2019-02-22 | Discharge: 2019-02-22 | Disposition: A | Discharge status: Home / Self Care | Payer: Medicaid Other | Source: Ambulatory Visit | Attending: Urology | Admitting: Urology

## 2019-02-22 ENCOUNTER — Encounter (HOSPITAL_COMMUNITY): Payer: Self-pay

## 2019-02-22 ENCOUNTER — Other Ambulatory Visit: Payer: Self-pay

## 2019-02-22 DIAGNOSIS — N200 Calculus of kidney: Secondary | ICD-10-CM | POA: Insufficient documentation

## 2019-02-22 DIAGNOSIS — Z01812 Encounter for preprocedural laboratory examination: Secondary | ICD-10-CM | POA: Insufficient documentation

## 2019-02-22 HISTORY — DX: Essential (primary) hypertension: I10

## 2019-02-22 HISTORY — DX: Personal history of urinary calculi: Z87.442

## 2019-02-22 LAB — BASIC METABOLIC PANEL
Anion gap: 9 (ref 5–15)
BUN: 19 mg/dL (ref 6–20)
CO2: 25 mmol/L (ref 22–32)
Calcium: 10 mg/dL (ref 8.9–10.3)
Chloride: 101 mmol/L (ref 98–111)
Creatinine, Ser: 2.16 mg/dL — ABNORMAL HIGH (ref 0.44–1.00)
GFR calc Af Amer: 31 mL/min — ABNORMAL LOW (ref >60)
GFR calc non Af Amer: 27 mL/min — ABNORMAL LOW (ref >60)
Glucose, Bld: 258 mg/dL — ABNORMAL HIGH (ref 70–99)
Potassium: 4.2 mmol/L (ref 3.5–5.1)
Sodium: 135 mmol/L (ref 135–145)

## 2019-02-22 LAB — CBC
HCT: 29.2 % — ABNORMAL LOW (ref 36.0–46.0)
Hemoglobin: 8.5 g/dL — ABNORMAL LOW (ref 12.0–15.0)
MCH: 28.1 pg (ref 26.0–34.0)
MCHC: 29.1 g/dL — ABNORMAL LOW (ref 30.0–36.0)
MCV: 96.7 fL (ref 80.0–100.0)
Platelets: 521 10*3/uL — ABNORMAL HIGH (ref 150–400)
RBC: 3.02 MIL/uL — ABNORMAL LOW (ref 3.87–5.11)
RDW: 14.7 % (ref 11.5–15.5)
WBC: 10.3 10*3/uL (ref 4.0–10.5)
nRBC: 0 % (ref 0.0–0.2)

## 2019-02-22 LAB — HEMOGLOBIN A1C
Hgb A1c MFr Bld: 7.6 % — ABNORMAL HIGH (ref 4.8–5.6)
Mean Plasma Glucose: 171.42 mg/dL

## 2019-02-22 LAB — HCG, SERUM, QUALITATIVE: Preg, Serum: NEGATIVE

## 2019-02-22 LAB — GLUCOSE, CAPILLARY: Glucose-Capillary: 242 mg/dL — ABNORMAL HIGH (ref 70–99)

## 2019-02-22 NOTE — Progress Notes (Signed)
Patient received instructions from the Clinical Pharmacist from [place] about her schedule for administration of Lovenox bridge.   Patient states she was instructed to Not take her Coumadin on Sunday 02/18/2019 per Dr. Wynetta Emery .  She went to see the Clinical Pharmacist on 02/20/2019 and was given the Lovenox schedule.

## 2019-02-22 NOTE — Progress Notes (Signed)
Chart given to Iver Nestle, PA to review labs from 02/22/2019-(CBC- HgB), BMP (Creat.-2.16, Glucose-258, HgA1C-7.6).

## 2019-02-22 NOTE — Progress Notes (Signed)
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 Harrell Gave Lovena Neighbours.   Pt given detailed instructions on stopping Coumadin and started Lovenox bridge.  Holding Coumadin starting 5/17 and started Lovenox bridge 5/19.  Urology will advise when to restart warfarin.   Last seen by PCP, Karle Plumber, MD, 01/08/19.  Insulin increased at this visit.  Will repeat EKG DOS.  VS: BP (!) 144/86 (BP Location: Left Arm)   Pulse (!) 102   Temp 36.9 C (Oral)   Resp 18   Ht 5' 7"  (1.702 m)   Wt 123.1 kg   LMP 02/21/2019 Comment: has bleeding throughout the month  SpO2 100%   BMI 42.50 kg/m   PROVIDERS: Ladell Pier, MD is PCP    LABS: Labs reviewed: Acceptable for surgery. (all labs ordered are listed, but only abnormal results are displayed)  Labs Reviewed  GLUCOSE, CAPILLARY - Abnormal; Notable for the following components:      Result Value   Glucose-Capillary 242 (*)    All other components within normal limits  HEMOGLOBIN A1C - Abnormal; Notable for the following components:   Hgb A1c MFr Bld 7.6 (*)    All other components within normal limits  BASIC METABOLIC PANEL - Abnormal; Notable for the following components:   Glucose, Bld 258 (*)    Creatinine, Ser 2.16 (*)    GFR calc non Af Amer 27 (*)    GFR calc Af Amer 31 (*)    All other components within normal limits  CBC - Abnormal; Notable for the following components:   RBC 3.02 (*)    Hemoglobin 8.5 (*)    HCT 29.2 (*)    MCHC 29.1 (*)    Platelets 521 (*)    All other components within normal limits  HCG, SERUM, QUALITATIVE     IMAGES: CT Abdomen Pelvis 02/07/2019 IMPRESSION: 1. Right-sided double-J ureteral stent is positioned with formed pigtails in the right renal pelvis and urinary bladder. No hydronephrosis. There are multiple nonobstructive calculi bilaterally, unchanged in size and configuration. Redemonstrated calyceal air, as well as air within the urinary bladder, of uncertain etiology, presumably due to catheterization.  2.  Other chronic and incidental findings as detailed above.  EKG:   CV: Echo 06/26/18 Study Conclusions  - Left ventricle: The cavity size was normal. Wall thickness was   normal. Systolic function was mildly reduced. The estimated   ejection fraction was in the range of 45% to 50%.  Impressions:  - Mild LV dysfunction.   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   Suggest STAT CT angiogram  CT Angio 06/26/18 IMPRESSION: 1. Bilateral pyelonephritis with emphysematous pyelitis. Gas within the bilateral renal collecting systems and urinary bladder. 2. Bilateral renal calculi with 6 mm stone in the proximal right ureter. Mild  right hydronephrosis. 3. Negative for an aortic dissection. Limited evaluation of the abdominopelvic vascular structures. The left common iliac artery stent appears to be patent. 4. Extensive scarring in the left kidney. Past Medical History:  Diagnosis Date  . Anxiety   . Diabetes ([place])    Type 2  . GERD (gastroesophageal reflux disease)   . History of blood clots    ,DVT-left leg, and early 2000, had blood clot behind left breast  . History of kidney stones   . Hypercalcemia   . Hyperparathyroidism   . Hypertension    had been on Lisinopril and PCP took her off it 4 months  ago  . Nephrolithiasis   . Renal calculi     Past Surgical History:  Procedure Laterality Date  . ABDOMINAL AORTOGRAM W/LOWER EXTREMITY N/A 11/14/2017   Procedure: ABDOMINAL AORTOGRAM W/LOWER EXTREMITY;  Surgeon: Waynetta Sandy, MD;  Location: [place] [facility];  Service: Cardiovascular;  Laterality: N/A;  . ANGIOPLASTY ILLIAC ARTERY Left 02/25/2018   Procedure: BALLOON ANGIOPLASTY LEFT ILIAC ARTERY;  Surgeon: Conrad Dustin Acres, MD;  Location: [place];  Service: Vascular;  Laterality: Left;  . APPLICATION OF WOUND VAC Left 03/03/2018   Procedure: APPLICATION OF WOUND VAC;  Surgeon: Angelia Mould, MD;  Location: [place];  Service: Vascular;  Laterality: Left;  . CYSTOSCOPY W/ URETERAL STENT PLACEMENT Right 06/27/2018   Procedure: CYSTOSCOPY WITH RETROGRADE PYELOGRAM/URETERAL DOUBLE J STENT PLACEMENT;  Surgeon: Ceasar Mons, MD;  Location: [facility];  Service: Urology;  Laterality: Right;  . EMBOLECTOMY Left 02/24/2017   Procedure: Left Lower Extremity Embolectomy and Angiogram.;  Surgeon: Waynetta Sandy, MD;  Location: [place];  Service: Vascular;  Laterality: Left;  . INSERTION OF ILIAC STENT  02/24/2017   Procedure: INSERTION OF Common ILIAC STENT;  Surgeon: Waynetta Sandy, MD;  Location: [place];  Service: Vascular;;  . INTRAOPERATIVE ARTERIOGRAM Left 02/25/2018   Procedure: INTRA OPERATIVE ARTERIOGRAM WITH LEFT LEG RUNOFF;  Surgeon: Conrad Agenda, MD;  Location: [place];  Service: Vascular;  Laterality: Left;  . LOWER EXTREMITY ANGIOGRAM Left 02/24/2017   Procedure: Aortagram, Left lower extremity Run-off;  Surgeon: Waynetta Sandy, MD;  Location: [place];  Service: Vascular;  Laterality: Left;  . PATCH ANGIOPLASTY Left 02/25/2018   Procedure: PATCH ANGIOPLASTY LEFT SUPERFICIAL FEMORAL ARTERY WITH BOVINE PATCH;  Surgeon: Conrad Climax, MD;  Location: [place];  Service: Vascular;  Laterality: Left;  . PERCUTANEOUS VENOUS THROMBECTOMY,LYSIS WITH  INTRAVASCULAR ULTRASOUND (IVUS) Left 05/12/2018   Procedure: MECHANICAL THROMBECTOMY LEFT LEG, BALLOON ANGIOPLASTY LEFT ANTERIOR TIBIAL ARTERY, AORTOGRAM WITH LEFT LEG RUNOFF;  Surgeon: Serafina Mitchell, MD;  Location: [place];  Service: Vascular;  Laterality: Left;  . 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 THROMBECTOMY FEMORAL ARTERY Left 02/25/2018   Procedure: LEFT ILIAC AND POPLITEAL ARTERY THROMBECTOMY;  Surgeon: Conrad , MD;  Location: [place];  Service: Vascular;  Laterality: Left;  . TUBAL LIGATION    . WOUND DEBRIDEMENT Left 03/03/2018   Procedure: DEBRIDEMENT WOUND;  Surgeon: Angelia Mould, MD;  Location: [place];  Service: Vascular;  Laterality: Left;  . WOUND EXPLORATION Left 03/03/2018   Procedure: WOUND EXPLORATION;  Surgeon: Angelia Mould, MD;  Location: [facility];  Service: Vascular;  Laterality: Left;    MEDICATIONS: . albuterol (PROAIR HFA) 108 (90 Base) MCG/ACT inhaler  . aspirin EC 81 MG tablet  . blood glucose meter kit and supplies  . Blood Glucose Monitoring Suppl (TRUE  METRIX METER) w/Device KIT  . buPROPion (WELLBUTRIN SR) 150 MG 12 hr tablet  . enoxaparin (LOVENOX) 120 MG/0.8ML injection  . ferrous sulfate 325 (65 FE) MG tablet  . gabapentin (NEURONTIN) 400 MG capsule  . glucose blood test strip  . hydrOXYzine (ATARAX/VISTARIL) 25 MG tablet  . insulin aspart (NOVOLOG FLEXPEN) 100 UNIT/ML FlexPen  . Insulin Glargine (LANTUS SOLOSTAR) 100 UNIT/ML Solostar Pen  . Insulin Pen Needle 31G X 5 MM MISC  . Insulin Syringe-Needle U-100 (BD INSULIN SYRINGE ULTRAFINE) 31G X 5/16" 0.5 ML MISC  . megestrol (MEGACE) 40 MG tablet  . omeprazole (PRILOSEC) 20 MG capsule  . oxybutynin (DITROPAN) 5 MG tablet  . polyethylene glycol (MIRALAX) packet  . sertraline (ZOLOFT) 50 MG tablet  . sodium polystyrene (KAYEXALATE) 15 GM/60ML suspension  . TRUEPLUS LANCETS 28G MISC  . warfarin (COUMADIN) 5 MG tablet   No current facility-administered medications for this  encounter.     Maia [facility] Pre-Surgical Testing [phone] 02/22/19 3:56 PM

## 2019-02-22 NOTE — Progress Notes (Signed)
   02/22/19 0900  OBSTRUCTIVE SLEEP APNEA  Have you ever been diagnosed with sleep apnea through a sleep study? No  Do you snore loudly (loud enough to be heard through closed doors)?  1  Do you often feel tired, fatigued, or sleepy during the daytime (such as falling asleep during driving or talking to someone)? 0  Has anyone observed you stop breathing during your sleep? 1  Do you have, or are you being treated for high blood pressure? 1  BMI more than 35 kg/m2? 1  Age > 50 (1-yes) 0  Neck circumference greater than:Female 16 inches or larger, Female 17inches or larger? 1  Female Gender (Yes=1) 0  Obstructive Sleep Apnea Score 5  Score 5 or greater  Results sent to PCP

## 2019-02-22 NOTE — Patient Instructions (Addendum)
Terri Sparks  02/22/2019   Your procedure is scheduled on: Friday 02/23/2019  Report to [facility] Main  Entrance              Report to admitting at   1000 AM     Call this number if you have problems the morning of surgery [phone]    How to Manage Your Diabetes Before and After Surgery  Why is it important to control my blood sugar before and after surgery? . Improving blood sugar levels before and after surgery helps healing and can limit problems. . A way of improving blood sugar control is eating a healthy diet by: o  Eating less sugar and carbohydrates o  Increasing activity/exercise o  Talking with your doctor about reaching your blood sugar goals . High blood sugars (greater than 180 mg/dL) can raise your risk of infections and slow your recovery, so you will need to focus on controlling your diabetes during the weeks before surgery. . Make sure that the doctor who takes care of your diabetes knows about your planned surgery including the date and location.  How do I manage my blood sugar before surgery? . Check your blood sugar at least 4 times a day, starting 2 days before surgery, to make sure that the level is not too high or low. o Check your blood sugar the morning of your surgery when you wake up and every 2 hours until you get to the Short Stay unit. . If your blood sugar is less than 70 mg/dL, you will need to treat for low blood sugar: o Do not take insulin. o Treat a low blood sugar (less than 70 mg/dL) with  cup of clear juice (cranberry or apple), 4 glucose tablets, OR glucose gel. o Recheck blood sugar in 15 minutes after treatment (to make sure it is greater than 70 mg/dL). If your blood sugar is not greater than 70 mg/dL on recheck, call [phone] for further instructions. . Report your blood sugar to the short stay nurse when you get to Short Stay.  . If you are admitted to the hospital after surgery: o Your blood sugar  will be checked by the staff and you will probably be given insulin after surgery (instead of oral diabetes medicines) to make sure you have good blood sugar levels. o The goal for blood sugar control after surgery is 80-180 mg/dL.   WHAT DO I DO ABOUT MY DIABETES MEDICATION?  Marland Kitchen Do not take oral diabetes medicines (pills) the morning of surgery.  . THE NIGHT BEFORE SURGERY, take  35    units of  Lantus     insulin.       . The day of surgery, Use the Novolog Flexpen for below schedule if needed ,  . If your CBG is greater than 220 mg/dL, you may take  of your sliding scale  . (correction) dose of insulin.                   BRUSH YOUR TEETH MORNING OF SURGERY AND RINSE YOUR MOUTH OUT, NO CHEWING GUM CANDY OR MINTS.     Take these medicines the morning of surgery with A SIP OF WATER: Bupropion (Wellbutrin SR), Gabapentin (Neurontin), Omeprazole (Prilosec), Sertraline (Zoloft), use Albuterol inhaler if needed and bring inhale with you to the hospital.   DO NOT TAKE ANY DIABETIC MEDICATIONS DAY OF  YOUR SURGERY!                               You may not have any metal on your body including hair pins and              piercings  Do not wear jewelry, make-up, lotions, powders or perfumes, deodorant             Do not wear nail polish.  Do not shave  48 hours prior to surgery.              Do not bring valuables to the hospital. [place].  Contacts, dentures or bridgework may not be worn into surgery.  Leave suitcase in the car. After surgery it may be brought to your room.     Patients discharged the day of surgery will not be allowed to drive home. IF YOU ARE HAVING SURGERY AND GOING HOME THE SAME DAY, YOU MUST HAVE AN ADULT TO DRIVE YOU HOME AND BE WITH YOU FOR 24 HOURS. YOU MAY GO HOME BY TAXI OR UBER OR ORTHERWISE, BUT AN ADULT MUST ACCOMPANY YOU HOME AND STAY WITH YOU FOR 24 HOURS.  Name and phone number of your driver:   Terri Sparks                Please read over the following fact sheets you were given: _____________________________________________________________________             [facility] - Preparing for Surgery Before surgery, you can play an important role.  Because skin is not sterile, your skin needs to be as free of germs as possible.  You can reduce the number of germs on your skin by washing with CHG (chlorahexidine gluconate) soap before surgery.  CHG is an antiseptic cleaner which kills germs and bonds with the skin to continue killing germs even after washing. Please DO NOT use if you have an allergy to CHG or antibacterial soaps.  If your skin becomes reddened/irritated stop using the CHG and inform your nurse when you arrive at Short Stay. Do not shave (including legs and underarms) for at least 48 hours prior to the first CHG shower.  You may shave your face/neck. Please follow these instructions carefully:  1.  Shower with CHG Soap the night before surgery and the  morning of Surgery.  2.  If you choose to wash your hair, wash your hair first as usual with your  normal  shampoo.  3.  After you shampoo, rinse your hair and body thoroughly to remove the  shampoo.                           4.  Use CHG as you would any other liquid soap.  You can apply chg directly  to the skin and wash                       Gently with a scrungie or clean washcloth.  5.  Apply the CHG Soap to your body ONLY FROM THE NECK DOWN.   Do not use on face/ open  Wound or open sores. Avoid contact with eyes, ears mouth and genitals (private parts).                       Wash face,  Genitals (private parts) with your normal soap.             6.  Wash thoroughly, paying special attention to the area where your surgery  will be performed.  7.  Thoroughly rinse your body with warm water from the neck down.  8.  DO NOT shower/wash with your normal soap after using and rinsing off  the  CHG Soap.                9.  Pat yourself dry with a clean towel.            10.  Wear clean pajamas.            11.  Place clean sheets on your bed the night of your first shower and do not  sleep with pets. Day of Surgery : Do not apply any lotions/deodorants the morning of surgery.  Please wear clean clothes to the hospital/surgery center.  FAILURE TO FOLLOW THESE INSTRUCTIONS MAY RESULT IN THE CANCELLATION OF YOUR SURGERY PATIENT SIGNATURE_________________________________  NURSE SIGNATURE__________________________________  ________________________________________________________________________

## 2019-02-22 NOTE — Anesthesia Preprocedure Evaluation (Addendum)
Anesthesia Evaluation  Patient identified by MRN, date of birth, ID band Patient awake    Reviewed: Allergy & Precautions, H&P , NPO status , Patient's Chart, lab work & pertinent test results  Airway Mallampati: II  TM Distance: >3 FB Neck ROM: Full    Dental no notable dental hx. (+) Poor Dentition, Dental Advisory Given,    Pulmonary Current Smoker,    Pulmonary exam normal breath sounds clear to auscultation       Cardiovascular hypertension, Normal cardiovascular exam Rhythm:Regular Rate:Normal     Neuro/Psych negative neurological ROS  negative psych ROS   GI/Hepatic Neg liver ROS, GERD  ,  Endo/Other  diabetes, Poorly ControlledMorbid obesity  Renal/GU negative Renal ROS  negative genitourinary   Musculoskeletal negative musculoskeletal ROS (+)   Abdominal   Peds negative pediatric ROS (+)  Hematology  (+) Blood dyscrasia, anemia ,   Anesthesia Other Findings   Reproductive/Obstetrics negative OB ROS                           Anesthesia Physical Anesthesia Plan  ASA: III  Anesthesia Plan: General   Post-op Pain Management:    Induction: Intravenous and Rapid sequence  PONV Risk Score and Plan: 2 and Ondansetron, Treatment may vary due to age or medical condition and Midazolam  Airway Management Planned: LMA  Additional Equipment:   Intra-op Plan:   Post-operative Plan:   Informed Consent: I have reviewed the patients History and Physical, chart, labs and discussed the procedure including the risks, benefits and alternatives for the proposed anesthesia with the patient or authorized representative who has indicated his/her understanding and acceptance.     Dental advisory given  Plan Discussed with: CRNA and Surgeon  Anesthesia Plan Comments: (See PAT note 02/22/2019, Konrad Felix, PA-C)      Anesthesia Quick Evaluation

## 2019-02-23 ENCOUNTER — Ambulatory Visit (HOSPITAL_COMMUNITY)
Admission: RE | Admit: 2019-02-23 | Discharge: 2019-02-23 | Disposition: A | Discharge status: Home / Self Care | Payer: Medicaid Other | Attending: Urology | Admitting: Urology

## 2019-02-23 ENCOUNTER — Other Ambulatory Visit: Payer: Self-pay

## 2019-02-23 ENCOUNTER — Encounter (HOSPITAL_COMMUNITY)
Admission: RE | Disposition: A | Discharge status: Home / Self Care | Payer: Self-pay | Source: Home / Self Care | Attending: Urology

## 2019-02-23 ENCOUNTER — Ambulatory Visit (HOSPITAL_COMMUNITY): Payer: Medicaid Other

## 2019-02-23 ENCOUNTER — Ambulatory Visit (HOSPITAL_COMMUNITY): Payer: Medicaid Other | Admitting: Physician Assistant

## 2019-02-23 ENCOUNTER — Encounter (HOSPITAL_COMMUNITY): Payer: Self-pay | Admitting: *Deleted

## 2019-02-23 ENCOUNTER — Encounter (HOSPITAL_COMMUNITY): Admission: RE | Payer: Self-pay | Source: Home / Self Care

## 2019-02-23 ENCOUNTER — Ambulatory Visit (HOSPITAL_COMMUNITY): Payer: Medicaid Other | Admitting: Anesthesiology

## 2019-02-23 ENCOUNTER — Ambulatory Visit (HOSPITAL_COMMUNITY): Admission: RE | Admit: 2019-02-23 | Payer: Self-pay | Source: Home / Self Care | Admitting: Urology

## 2019-02-23 DIAGNOSIS — N390 Urinary tract infection, site not specified: Secondary | ICD-10-CM | POA: Insufficient documentation

## 2019-02-23 DIAGNOSIS — I10 Essential (primary) hypertension: Secondary | ICD-10-CM | POA: Diagnosis not present

## 2019-02-23 DIAGNOSIS — E1151 Type 2 diabetes mellitus with diabetic peripheral angiopathy without gangrene: Secondary | ICD-10-CM | POA: Diagnosis not present

## 2019-02-23 DIAGNOSIS — Z6841 Body Mass Index (BMI) 40.0 and over, adult: Secondary | ICD-10-CM | POA: Diagnosis not present

## 2019-02-23 DIAGNOSIS — Z7982 Long term (current) use of aspirin: Secondary | ICD-10-CM | POA: Diagnosis not present

## 2019-02-23 DIAGNOSIS — F17210 Nicotine dependence, cigarettes, uncomplicated: Secondary | ICD-10-CM | POA: Diagnosis not present

## 2019-02-23 DIAGNOSIS — Z955 Presence of coronary angioplasty implant and graft: Secondary | ICD-10-CM | POA: Insufficient documentation

## 2019-02-23 DIAGNOSIS — Z7901 Long term (current) use of anticoagulants: Secondary | ICD-10-CM | POA: Insufficient documentation

## 2019-02-23 DIAGNOSIS — E1165 Type 2 diabetes mellitus with hyperglycemia: Secondary | ICD-10-CM | POA: Diagnosis not present

## 2019-02-23 DIAGNOSIS — Y838 Other surgical procedures as the cause of abnormal reaction of the patient, or of later complication, without mention of misadventure at the time of the procedure: Secondary | ICD-10-CM | POA: Diagnosis not present

## 2019-02-23 DIAGNOSIS — T83192A Other mechanical complication of urinary stent, initial encounter: Secondary | ICD-10-CM | POA: Insufficient documentation

## 2019-02-23 DIAGNOSIS — N202 Calculus of kidney with calculus of ureter: Secondary | ICD-10-CM | POA: Diagnosis not present

## 2019-02-23 DIAGNOSIS — N2889 Other specified disorders of kidney and ureter: Secondary | ICD-10-CM | POA: Insufficient documentation

## 2019-02-23 DIAGNOSIS — Z86718 Personal history of other venous thrombosis and embolism: Secondary | ICD-10-CM | POA: Insufficient documentation

## 2019-02-23 HISTORY — PX: CYSTOSCOPY WITH STENT PLACEMENT: SHX5790

## 2019-02-23 LAB — PROTIME-INR
INR: 0.9 (ref 0.8–1.2)
Prothrombin Time: 12.4 seconds (ref 11.4–15.2)

## 2019-02-23 LAB — GLUCOSE, CAPILLARY
Glucose-Capillary: 254 mg/dL — ABNORMAL HIGH (ref 70–99)
Glucose-Capillary: 195 mg/dL — ABNORMAL HIGH (ref 70–99)

## 2019-02-23 SURGERY — CYSTOSCOPY, WITH STENT INSERTION
Anesthesia: General

## 2019-02-23 MED ORDER — ONDANSETRON HCL 4 MG PO TABS: 4.0000 mg | ORAL_TABLET | Freq: Every day | ORAL | 1 refills | Status: DC | PRN

## 2019-02-23 MED ORDER — OXYCODONE HCL 5 MG PO TABS
5.0000 mg | ORAL_TABLET | Freq: Once | ORAL | Status: AC | PRN
  Administered 2019-02-23: 5 mg via ORAL

## 2019-02-23 MED ORDER — SUCCINYLCHOLINE CHLORIDE 200 MG/10ML IV SOSY
PREFILLED_SYRINGE | INTRAVENOUS | Status: AC
  Filled 2019-02-23: qty 10

## 2019-02-23 MED ORDER — CEPHALEXIN 500 MG PO CAPS: 500.0000 mg | ORAL_CAPSULE | Freq: Two times a day (BID) | ORAL | 0 refills | Status: AC

## 2019-02-23 MED ORDER — OXYCODONE HCL 5 MG PO TABS
ORAL_TABLET | ORAL | Status: AC
  Filled 2019-02-23: qty 1

## 2019-02-23 MED ORDER — MIDAZOLAM HCL 5 MG/5ML IJ SOLN
INTRAMUSCULAR | Status: DC | PRN
  Administered 2019-02-23: 2 mg via INTRAVENOUS

## 2019-02-23 MED ORDER — PROMETHAZINE HCL 25 MG/ML IJ SOLN: 6.2500 mg | INTRAMUSCULAR | Status: DC | PRN

## 2019-02-23 MED ORDER — LIDOCAINE 2% (20 MG/ML) 5 ML SYRINGE
INTRAMUSCULAR | Status: AC
  Filled 2019-02-23: qty 10

## 2019-02-23 MED ORDER — SODIUM CHLORIDE 0.9 % IV SOLN
INTRAVENOUS | Status: DC | PRN
  Administered 2019-02-23: 13:00:00

## 2019-02-23 MED ORDER — CEFAZOLIN SODIUM-DEXTROSE 2-4 GM/100ML-% IV SOLN
2.0000 g | Freq: Once | INTRAVENOUS | Status: AC
  Administered 2019-02-23: 12:00:00 2 g via INTRAVENOUS
  Filled 2019-02-23: qty 100

## 2019-02-23 MED ORDER — FENTANYL CITRATE (PF) 100 MCG/2ML IJ SOLN
INTRAMUSCULAR | Status: AC
  Filled 2019-02-23: qty 2

## 2019-02-23 MED ORDER — MIDAZOLAM HCL 2 MG/2ML IJ SOLN
INTRAMUSCULAR | Status: AC
  Filled 2019-02-23: qty 2

## 2019-02-23 MED ORDER — PROPOFOL 10 MG/ML IV BOLUS
INTRAVENOUS | Status: DC | PRN
  Administered 2019-02-23: 200 mg via INTRAVENOUS

## 2019-02-23 MED ORDER — TRAMADOL HCL 50 MG PO TABS: 50.0000 mg | ORAL_TABLET | Freq: Four times a day (QID) | ORAL | 0 refills | Status: AC | PRN

## 2019-02-23 MED ORDER — DEXAMETHASONE SODIUM PHOSPHATE 10 MG/ML IJ SOLN
INTRAMUSCULAR | Status: DC | PRN
  Administered 2019-02-23: 5 mg via INTRAVENOUS

## 2019-02-23 MED ORDER — FENTANYL CITRATE (PF) 100 MCG/2ML IJ SOLN
INTRAMUSCULAR | Status: DC | PRN
  Administered 2019-02-23 (×2): 50 ug via INTRAVENOUS

## 2019-02-23 MED ORDER — ACETAMINOPHEN 10 MG/ML IV SOLN: 1000.0000 mg | Freq: Once | INTRAVENOUS | Status: DC | PRN

## 2019-02-23 MED ORDER — PHENAZOPYRIDINE HCL 200 MG PO TABS: 200.0000 mg | ORAL_TABLET | Freq: Three times a day (TID) | ORAL | 0 refills | Status: DC | PRN

## 2019-02-23 MED ORDER — LIDOCAINE 2% (20 MG/ML) 5 ML SYRINGE
INTRAMUSCULAR | Status: DC | PRN
  Administered 2019-02-23: 100 mg via INTRAVENOUS

## 2019-02-23 MED ORDER — ONDANSETRON HCL 4 MG/2ML IJ SOLN
INTRAMUSCULAR | Status: DC | PRN
  Administered 2019-02-23: 4 mg via INTRAVENOUS

## 2019-02-23 MED ORDER — WATER FOR IRRIGATION, STERILE IR SOLN
Status: DC | PRN
  Administered 2019-02-23: 3000 mL

## 2019-02-23 MED ORDER — FENTANYL CITRATE (PF) 100 MCG/2ML IJ SOLN: 25.0000 ug | INTRAMUSCULAR | Status: DC | PRN

## 2019-02-23 MED ORDER — OXYCODONE HCL 5 MG/5ML PO SOLN: 5.0000 mg | Freq: Once | ORAL | Status: AC | PRN

## 2019-02-23 MED ORDER — LACTATED RINGERS IV SOLN
INTRAVENOUS | Status: DC
  Administered 2019-02-23: 11:00:00 via INTRAVENOUS

## 2019-02-23 MED FILL — ONDANSETRON HCL 4 MG TABLET: 4 | 30 days supply | Qty: 30 | Fill #0

## 2019-02-23 MED FILL — CEPHALEXIN 500 MG CAPSULE: 500 | 3 days supply | Qty: 6 | Fill #0

## 2019-02-23 MED FILL — traMADol HCL 50 MG TABS: 50 | 5 days supply | Qty: 20 | Fill #0

## 2019-02-23 SURGICAL SUPPLY — 13 items
BAG URO CATCHER STRL LF (MISCELLANEOUS) ×2 IMPLANT
CATH URET 5FR 28IN OPEN ENDED (CATHETERS) ×2 IMPLANT
CLOTH BEACON ORANGE TIMEOUT ST (SAFETY) ×2 IMPLANT
GLOVE BIOGEL M STRL SZ7.5 (GLOVE) ×2 IMPLANT
GOWN STRL REUS W/TWL LRG LVL3 (GOWN DISPOSABLE) ×4 IMPLANT
GUIDEWIRE STR DUAL SENSOR (WIRE) IMPLANT
GUIDEWIRE ZIPWRE .038 STRAIGHT (WIRE) ×2 IMPLANT
KIT TURNOVER KIT A (KITS) IMPLANT
MANIFOLD NEPTUNE II (INSTRUMENTS) ×2 IMPLANT
PACK CYSTO (CUSTOM PROCEDURE TRAY) ×2 IMPLANT
STENT URET 6FRX24 CONTOUR (STENTS) ×4 IMPLANT
TUBING CONNECTING 10 (TUBING) ×2 IMPLANT
TUBING UROLOGY SET (TUBING) IMPLANT

## 2019-02-23 NOTE — H&P (Signed)
Urology H+P Note   Urology Attending: Ellison Hughs, MD  Chief Complaint / HPI:  1. Calyceal air of unknown origin (chronic)  2. Retained right ureteral stent with encrustation (now > 8 months)  3. 21mm obstructing Right UPJ stone  4. Bilateral nephrolithiasis (> 2 cm burden on the left)   21F who returns today for follow-up of the above urologic concerns. She was admitted to the hospital in Sept 2019 with severe urosepsis from 6 mm obstructing right UPJ stone and underwent right ureteral stent placement. Urine culture did not reveal predominant organism. Unfortunately, she was lost to follow up and missed clinic appointment for stone treatment and stent removal. She returned on 12/21/18 with hematuria, and left flank / central lower back discomfort with a >6 month indwelling stent. Denied recent infectious symptoms, no fevers, no chills. She also reported chronic nausea, abdominal bloating. She was prescribed empiric augmentin and urine culture at that visit showed > 3 organisms present. The plan at that time was to obtain a CT scan to evaluate stone burden and degree of stent encrustation with follow-up in 1 week, but patient did not follow-up until 2 months later.   CT obtained on 02/07/19 was personally reviewed and demonstrated stent in appropriate position without hydronephrosis. Along the length of the stent, it does appear that the patient has two areas with small stone / encrustation in the distal and mid ureter (largest is 5 mm). In the left kidney, she has 1 cm and 4 mm lower pole stones. In the right kidney, there is a > 1.5 cm stone in the upper pole which looks possibly intra-parenchymal as well as 1.4 cm and 5 mm lower pole stones. It also redemonstrated calyceal air bilaterally and in the bladder.   At follow-up visit, she reports ongoing, dull, baseline pain in the setting of having a stent. She reports a lot of pressure with urination. No fevers. Reports nausea and vomiting when  pain increases. Majority of pain is in left flank, but reports that it fluctuates across the back and is sometimes located in the center of her back. Also has some suprapubic pressure. Reports pain has been ongoing since September - tylenol has not helped. Completed course of previously prescribed augmentin without an issue.   Of note, she has a significant medical history of severe PAD s/p endarterectomies and vascular stents. She is on Warfarin and ASA, started by Dr. Donzetta Matters, but managed by Dr. Wynetta Emery. She has discontinued coumadin and is currently on a lovenox bridge.   Today, she reports she is doing well. Continues on antibiotics.   Past Medical History: Past Medical History:  Diagnosis Date  . Anxiety   . Diabetes ([place])    Type 2  . GERD (gastroesophageal reflux disease)   . History of blood clots    ,DVT-left leg, and early 2000, had blood clot behind left breast  . History of kidney stones   . Hypercalcemia   . Hyperparathyroidism   . Hypertension    had been on Lisinopril and PCP took her off it 4 months ago  . Nephrolithiasis   . Renal calculi     Past Surgical History:  Past Surgical History:  Procedure Laterality Date  . ABDOMINAL AORTOGRAM W/LOWER EXTREMITY N/A 11/14/2017   Procedure: ABDOMINAL AORTOGRAM W/LOWER EXTREMITY;  Surgeon: Waynetta Sandy, MD;  Location: [facility];  Service: Cardiovascular;  Laterality: N/A;  . ANGIOPLASTY ILLIAC ARTERY Left 02/25/2018   Procedure: BALLOON ANGIOPLASTY LEFT ILIAC ARTERY;  Surgeon: Conrad Homedale, MD;  Location: [place];  Service: Vascular;  Laterality: Left;  . APPLICATION OF WOUND VAC Left 03/03/2018   Procedure: APPLICATION OF WOUND VAC;  Surgeon: Angelia Mould, MD;  Location: [place];  Service: Vascular;  Laterality: Left;  . CYSTOSCOPY W/ URETERAL STENT PLACEMENT Right 06/27/2018   Procedure: CYSTOSCOPY WITH RETROGRADE PYELOGRAM/URETERAL DOUBLE J STENT PLACEMENT;  Surgeon: Ceasar Mons, MD;   Location: [place];  Service: Urology;  Laterality: Right;  . EMBOLECTOMY Left 02/24/2017   Procedure: Left Lower Extremity Embolectomy and Angiogram.;  Surgeon: Waynetta Sandy, MD;  Location: [place];  Service: Vascular;  Laterality: Left;  . INSERTION OF ILIAC STENT  02/24/2017   Procedure: INSERTION OF Common ILIAC STENT;  Surgeon: Waynetta Sandy, MD;  Location: [place];  Service: Vascular;;  . INTRAOPERATIVE ARTERIOGRAM Left 02/25/2018   Procedure: INTRA OPERATIVE ARTERIOGRAM WITH LEFT LEG RUNOFF;  Surgeon: Conrad Humble, MD;  Location: [place];  Service: Vascular;  Laterality: Left;  . LOWER EXTREMITY ANGIOGRAM Left 02/24/2017   Procedure: Aortagram, Left lower extremity Run-off;  Surgeon: Waynetta Sandy, MD;  Location: [place];  Service: Vascular;  Laterality: Left;  . PATCH ANGIOPLASTY Left 02/25/2018   Procedure: PATCH ANGIOPLASTY LEFT SUPERFICIAL FEMORAL ARTERY WITH BOVINE PATCH;  Surgeon: Conrad Paulden, MD;  Location: [place];  Service: Vascular;  Laterality: Left;  . PERCUTANEOUS VENOUS THROMBECTOMY,LYSIS WITH INTRAVASCULAR ULTRASOUND (IVUS) Left 05/12/2018   Procedure: MECHANICAL THROMBECTOMY LEFT LEG, BALLOON ANGIOPLASTY LEFT ANTERIOR TIBIAL ARTERY, AORTOGRAM WITH LEFT LEG RUNOFF;  Surgeon: Serafina Mitchell, MD;  Location: [place];  Service: Vascular;  Laterality: Left;  . 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 THROMBECTOMY FEMORAL ARTERY Left 02/25/2018   Procedure: LEFT ILIAC AND POPLITEAL ARTERY THROMBECTOMY;  Surgeon: Conrad Delcambre, MD;  Location: [place];  Service: Vascular;  Laterality: Left;  . TUBAL LIGATION    . WOUND DEBRIDEMENT Left 03/03/2018   Procedure: DEBRIDEMENT WOUND;  Surgeon: Angelia Mould, MD;  Location: [place];  Service: Vascular;  Laterality: Left;  . WOUND EXPLORATION Left 03/03/2018   Procedure: WOUND EXPLORATION;  Surgeon: Angelia Mould, MD;  Location: [facility];  Service: Vascular;  Laterality: Left;    Medication: No current  facility-administered medications for this encounter.     Allergies: Allergies  Allergen Reactions  . Ciprofloxacin Hcl Hives  . Macrobid [Nitrofurantoin Macrocrystal] Hives, Shortness Of Breath and Rash  . Other Hives, Shortness Of Breath and Rash    NO "-CILLINS"!!!  . Penicillins Shortness Of Breath    Had had cephalosporins without incident Has patient had a PCN reaction causing immediate rash, facial/tongue/throat swelling, SOB or lightheadedness with hypotension: Yes Has patient had a PCN reaction causing severe rash involving mucus membranes or skin necrosis: Unk Has patient had a PCN reaction that required hospitalization: Unk Has patient had a PCN reaction occurring within the last 10 years: No If all of the above answers are "NO", then may proceed with Cephalosporin use.   . Sulfa Antibiotics Hives, Shortness Of Breath and Rash  . Dilaudid [Hydromorphone Hcl] Other (See Comments)    Asystole per pt report  . Lexapro [Escitalopram Oxalate] Other (See Comments)    "I just did not like it."    Social History: Social History   Tobacco Use  . Smoking status: Current Some Day Smoker    Packs/day: 0.25    Years: 15.00    Pack years: 3.75  Types: Cigarettes  . Smokeless tobacco: Never Used  Substance Use Topics  . Alcohol use: Yes    Comment: occassionally  . Drug use: Yes    Types: Marijuana    Comment: once daily- last time used was night of 02/21/2019    Family History Family History  Problem Relation Age of Onset  . Depression Mother   . Diabetes Father   . Heart failure Father     Review of Systems 10 systems were reviewed and are negative except as noted specifically in the HPI.  Objective   Vital signs in last 24 hours: LMP 02/21/2019 Comment: has bleeding throughout the month  Physical Exam General: NAD, A&O, resting, appropriate HEENT: [place]/AT, EOMI, MMM Pulmonary: Normal work of breathing Cardiovascular: HDS, adequate peripheral  perfusion Abdomen: Soft, NTTP, nondistended,  GU: Right CVA tenderness Extremities: warm and well perfused Neuro: Appropriate, no focal neurological deficits  Most Recent Labs: Lab Results  Component Value Date   WBC 10.3 02/22/2019   HGB 8.5 (L) 02/22/2019   HCT 29.2 (L) 02/22/2019   PLT 521 (H) 02/22/2019    Lab Results  Component Value Date   NA 135 02/22/2019   K 4.2 02/22/2019   CL 101 02/22/2019   CO2 25 02/22/2019   BUN 19 02/22/2019   CREATININE 2.16 (H) 02/22/2019   CALCIUM 10.0 02/22/2019   MG 1.5 (L) 06/28/2018    Lab Results  Component Value Date   INR 1.4 (A) 02/20/2019   APTT 28 02/25/2018     IMAGING: No results found.  ------  Assessment:  1. Calyceal air of unknown origin (chronic)  2. Retained right ureteral stent with encrustation (now > 8 months)  3. 65mm obstructing Right UPJ stone  4. Bilateral nephrolithiasis (> 2 cm burden on the left)   We previously discussed patient's retained stent, current stone burden, and calyceal air on her recent CT. We discussed the possible causes of calyceal air and that we worry the most about infection. The pateint is afebrile and, while she is having left flank pain, she reports this has been chronic since September, when air was also present on her CT. Given calyceal air and left flank pain, recommended drainage of the renal collecting system with placement of a left ureteral stent and exchange of the right ureteral stent. Right ureteral stent exchange may require ureteroscopy if significantly encrusted.   Following drainage and antibiotic treatment, the patient will also require definitive stone surgery with likely right ureteroscopy. We will further discuss plan for the left side with the patient to determine whether staged ureteroscopy or PCNL is a better approach. This treatment choice was not discussed at previous office visit and is complicated by her need for coumadin, history of non-compliance, and  possible infection.    Plan: - Proceed to OR for right ureteral stent exchange and left ureteral stent placement.  - Anticipate possible discharge home post-operatively if patient does well.

## 2019-02-23 NOTE — Anesthesia Procedure Notes (Signed)
Procedure Name: LMA Insertion Date/Time: 02/23/2019 12:31 PM Performed by: Lavina Hamman, CRNA Pre-anesthesia Checklist: Patient identified, Emergency Drugs available, Suction available and Patient being monitored Patient Re-evaluated:Patient Re-evaluated prior to induction Oxygen Delivery Method: Circle System Utilized Preoxygenation: Pre-oxygenation with 100% oxygen Induction Type: IV induction Ventilation: Mask ventilation without difficulty LMA: LMA with gastric port inserted LMA Size: 4.0 Number of attempts: 1 Airway Equipment and Method: Bite block Placement Confirmation: positive ETCO2 Tube secured with: Tape Dental Injury: Teeth and Oropharynx as per pre-operative assessment

## 2019-02-23 NOTE — Transfer of Care (Signed)
Immediate Anesthesia Transfer of Care Note  Patient: Terri Sparks  Procedure(s) Performed: Procedure(s): CYSTOSCOPY WITH RIGHT URETERAL STENT EXCHANGE/ LEFT URETERAL STENT PLACEMENT (N/A)  Patient Location: PACU  Anesthesia Type:General  Level of Consciousness:  sedated, patient cooperative and responds to stimulation  Airway & Oxygen Therapy:Patient Spontanous Breathing and Patient connected to face mask oxgen  Post-op Assessment:  Report given to PACU RN and Post -op Vital signs reviewed and stable  Post vital signs:  Reviewed and stable  Last Vitals:  Vitals:   02/23/19 1007  BP: 128/73  Pulse: (!) 103  Resp: 18  Temp: 36.7 C  SpO2: 122%    Complications: No apparent anesthesia complications

## 2019-02-23 NOTE — Anesthesia Postprocedure Evaluation (Signed)
Anesthesia Post Note  Patient: Designer, fashion/clothing  Procedure(s) Performed: CYSTOSCOPY WITH RIGHT URETERAL STENT EXCHANGE/ LEFT URETERAL STENT PLACEMENT (N/A )     Patient location during evaluation: PACU Anesthesia Type: General Level of consciousness: awake and alert Pain management: pain level controlled Vital Signs Assessment: post-procedure vital signs reviewed and stable Respiratory status: spontaneous breathing, nonlabored ventilation, respiratory function stable and patient connected to nasal cannula oxygen Cardiovascular status: blood pressure returned to baseline and stable Postop Assessment: no apparent nausea or vomiting Anesthetic complications: no    Last Vitals:  Vitals:   02/23/19 1330 02/23/19 1345  BP: (!) 144/90 (!) 145/92  Pulse: 87 91  Resp: 17 16  Temp:    SpO2: 100% 96%    Last Pain:  Vitals:   02/23/19 1330  TempSrc:   PainSc: [place] A Myrtha Tonkovich

## 2019-02-23 NOTE — Op Note (Signed)
Operative Note  Preoperative diagnosis:  1.  Retained right ureteral stent with UTI and air in the right collecting system 2.  Right ureteral stone 3.  Left renal stones (2 cm stone burden)  Postoperative diagnosis: Same  Procedure(s): 1.  Cystoscopy with right ureteral stent exchange 2.  Left ureteral stent placement 3.  Left retrograde pyelogram with intraoperative interpretation of fluoroscopic imaging  Surgeon: Ellison Hughs, MD  Assistants:  None  Anesthesia:  General  Complications:  None  EBL:  5 mL  Specimens: 1. Previously placed right ureteral stent was removed intact, inspected and discarded   Drains/Catheters: 1.  Bilateral 6 French x 24 cm JJ stents without tehters  Intraoperative findings:   1. Encrusted retained right ureteral stent 2. Her right ureteral stone has migrated to the distal aspects of the right ureter 3. Solitary left collecting system with no filling defects or dilation involving the left ureter or left renal pelvis seen on retrograde pyelogram  Indication:  Terri Sparks is a 43 y.o. female who is s/p right ureteral stent placement in 06/2018 for an obstructing ureteral stone who was lost to follow-up.  She eventually developed a UTI and had  CT that showed air in the right collecting system as well as >2 cm stone burden in the left renal pelvis.  She has been consented for the above procedures, voices understanding and wishes to proceed.   Description of procedure:  After informed consent was obtained, the patient was brought to the operating room and general LMA anesthesia was administered. The patient was then placed in the dorsolithotomy position and prepped and draped in the usual sterile fashion. A timeout was performed. A 23 French rigid cystoscope was then inserted into the urethral meatus and advanced into the bladder under direct vision. A complete bladder survey revealed no intravesical pathology.  Her previously placed right JJ  stent was then retracted to the urethral meatus.  A Glidewire was then used to intubate the lumen of the stent and was advanced up to the right renal pelvis, under fluoroscopic guidance.  Her previously placed stent was then removed intact, inspected and discarded.  A new 6 [place], 24 cm JJ stent was then placed over the wire and into good position within the right collecting system, confirming placement via fluoroscopy  A 5 French ureteral catheter was then inserted into the left ureteral orifice and a left retrograde pyelogram was obtained, the findings listed above.  A Glidewire was then advanced through the lumen of the ureteral catheter and up to the left renal pelvis, under fluoroscopic guidance.  A 6 French, 24 cm JJ stent was then placed over the wire and into good position within the left collecting system, confirming placement via fluoroscopy.  The patient's bladder was drained.  She tolerated the procedure well was transferred to the postanesthesia in stable condition.  Plan:  F/u in the resident clinic on 03/08/19 to plan her next surgery to address her bilateral kidney stones.

## 2019-02-24 ENCOUNTER — Encounter (HOSPITAL_COMMUNITY): Payer: Self-pay | Admitting: Urology

## 2019-03-01 ENCOUNTER — Encounter: Payer: Medicaid Other | Admitting: Pharmacist

## 2019-03-01 MED FILL — WARFARIN SODIUM 5 MG TABLET: 5 | 30 days supply | Qty: 60 | Fill #1

## 2019-03-06 ENCOUNTER — Other Ambulatory Visit: Payer: Self-pay

## 2019-03-06 ENCOUNTER — Ambulatory Visit: Payer: Self-pay | Attending: Family Medicine | Admitting: Pharmacist

## 2019-03-06 DIAGNOSIS — I749 Embolism and thrombosis of unspecified artery: Secondary | ICD-10-CM

## 2019-03-06 DIAGNOSIS — I745 Embolism and thrombosis of iliac artery: Secondary | ICD-10-CM

## 2019-03-06 LAB — POCT INR: INR: 1.7 — AB (ref 2.0–3.0)

## 2019-03-13 ENCOUNTER — Encounter: Payer: Medicaid Other | Admitting: Pharmacist

## 2019-03-15 ENCOUNTER — Encounter: Payer: Medicaid Other | Admitting: Pharmacist

## 2019-03-23 ENCOUNTER — Other Ambulatory Visit: Payer: Self-pay | Admitting: Internal Medicine

## 2019-03-23 DIAGNOSIS — IMO0001 Reserved for inherently not codable concepts without codable children: Secondary | ICD-10-CM

## 2019-03-30 ENCOUNTER — Other Ambulatory Visit: Payer: Self-pay | Admitting: Urology

## 2019-04-12 ENCOUNTER — Ambulatory Visit: Payer: Self-pay | Admitting: Internal Medicine

## 2019-04-13 ENCOUNTER — Telehealth: Payer: Self-pay | Admitting: Internal Medicine

## 2019-04-13 NOTE — Telephone Encounter (Signed)
Terri Sparks with [facility] called stating she faxed over paperwork for patient medical clearance. Please follow up.

## 2019-04-16 NOTE — Telephone Encounter (Signed)
Nurse called the patient's home phone number but received no answer and message was left on the voicemail for the patient to call back.  Return phone number given. 

## 2019-04-17 NOTE — Telephone Encounter (Signed)
Patient has appointment with Dr. Wynetta Emery on 04/19/2019.

## 2019-04-19 ENCOUNTER — Encounter: Payer: Self-pay | Admitting: Internal Medicine

## 2019-04-19 ENCOUNTER — Ambulatory Visit (HOSPITAL_COMMUNITY)
Admission: RE | Admit: 2019-04-19 | Discharge: 2019-04-19 | Disposition: A | Discharge status: Home / Self Care | Payer: Medicaid Other | Source: Ambulatory Visit | Attending: Family Medicine | Admitting: Family Medicine

## 2019-04-19 ENCOUNTER — Ambulatory Visit: Payer: Self-pay | Attending: Internal Medicine | Admitting: Internal Medicine

## 2019-04-19 ENCOUNTER — Other Ambulatory Visit: Payer: Self-pay

## 2019-04-19 ENCOUNTER — Telehealth: Payer: Self-pay | Admitting: Pharmacist

## 2019-04-19 VITALS — BP 143/90 | HR 99 | Temp 98.0°F | Resp 16 | Wt 276.8 lb

## 2019-04-19 DIAGNOSIS — R9431 Abnormal electrocardiogram [ECG] [EKG]: Secondary | ICD-10-CM | POA: Insufficient documentation

## 2019-04-19 DIAGNOSIS — Z01818 Encounter for other preprocedural examination: Secondary | ICD-10-CM

## 2019-04-19 DIAGNOSIS — IMO0002 Reserved for concepts with insufficient information to code with codable children: Secondary | ICD-10-CM

## 2019-04-19 DIAGNOSIS — E1165 Type 2 diabetes mellitus with hyperglycemia: Secondary | ICD-10-CM

## 2019-04-19 DIAGNOSIS — I10 Essential (primary) hypertension: Secondary | ICD-10-CM

## 2019-04-19 DIAGNOSIS — F419 Anxiety disorder, unspecified: Secondary | ICD-10-CM

## 2019-04-19 DIAGNOSIS — R300 Dysuria: Secondary | ICD-10-CM

## 2019-04-19 DIAGNOSIS — I745 Embolism and thrombosis of iliac artery: Secondary | ICD-10-CM

## 2019-04-19 DIAGNOSIS — Z01810 Encounter for preprocedural cardiovascular examination: Secondary | ICD-10-CM | POA: Insufficient documentation

## 2019-04-19 DIAGNOSIS — E1121 Type 2 diabetes mellitus with diabetic nephropathy: Secondary | ICD-10-CM

## 2019-04-19 HISTORY — DX: Abnormal electrocardiogram (ECG) (EKG): R94.31

## 2019-04-19 LAB — POCT URINALYSIS DIP (CLINITEK)
Bilirubin, UA: NEGATIVE
Glucose, UA: 100 mg/dL — AB
Ketones, POC UA: NEGATIVE mg/dL
Nitrite, UA: POSITIVE — AB
POC PROTEIN,UA: 100 — AB
Spec Grav, UA: 1.02 (ref 1.010–1.025)
Urobilinogen, UA: 0.2 E.U./dL
pH, UA: 6 (ref 5.0–8.0)

## 2019-04-19 LAB — POCT INR: INR: 2.7 (ref 2.0–3.0)

## 2019-04-19 LAB — GLUCOSE, POCT (MANUAL RESULT ENTRY): POC Glucose: 235 mg/dl — AB (ref 70–99)

## 2019-04-19 MED ORDER — INSULIN LISPRO (1 UNIT DIAL) 100 UNIT/ML (KWIKPEN): PEN_INJECTOR | SUBCUTANEOUS | 11 refills | Status: DC

## 2019-04-19 MED ORDER — BASAGLAR KWIKPEN 100 UNIT/ML ~~LOC~~ SOPN: 70.0000 [IU] | PEN_INJECTOR | Freq: Every day | SUBCUTANEOUS | 6 refills | Status: DC

## 2019-04-19 MED ORDER — WARFARIN SODIUM 5 MG PO TABS: ORAL_TABLET | ORAL | 3 refills | Status: DC

## 2019-04-19 MED ORDER — SERTRALINE HCL 50 MG PO TABS: 50.0000 mg | ORAL_TABLET | Freq: Every day | ORAL | 3 refills | Status: DC

## 2019-04-19 MED ORDER — AMLODIPINE BESYLATE 5 MG PO TABS: 5.0000 mg | ORAL_TABLET | Freq: Every day | ORAL | 3 refills | Status: DC

## 2019-04-19 MED ORDER — CEFUROXIME AXETIL 500 MG PO TABS: 500.0000 mg | ORAL_TABLET | Freq: Two times a day (BID) | ORAL | 0 refills | Status: DC

## 2019-04-19 MED ORDER — INSULIN PEN NEEDLE 31G X 5 MM MISC: 11 refills | Status: DC

## 2019-04-19 MED FILL — !BASAGLAR 100 UNIT/ML KWIK: 100 | 30 days supply | Qty: 21 | Fill #0

## 2019-04-19 MED FILL — ?AMLODIPINE BESYLATE 5MG TA: 5 | 30 days supply | Qty: 30 | Fill #0

## 2019-04-19 MED FILL — ?HUMALOG 100 UNITS/ML KWIKP: 100 | 33 days supply | Qty: 15 | Fill #0

## 2019-04-19 MED FILL — TRUEPLUS PEN NDL 31G X 1/4: 31G X 6 MM | 30 days supply | Qty: 100 | Fill #0

## 2019-04-19 MED FILL — CEFUROXIME AXETIL 500 MG TA: 500 | 7 days supply | Qty: 14 | Fill #0

## 2019-04-19 MED FILL — WARFARIN SODIUM 5 MG TABLET: 5 | 42 days supply | Qty: 60 | Fill #0

## 2019-04-19 MED FILL — GABAPENTIN 400 MG CAPSULE: 400 | 30 days supply | Qty: 120 | Fill #3

## 2019-04-19 MED FILL — hydrOXYzine HCL 25 MG TABS: 25 | 22 days supply | Qty: 90 | Fill #0

## 2019-04-19 MED FILL — SERTRALINE HCL 50 MG TABLET: 50 | 30 days supply | Qty: 30 | Fill #0

## 2019-04-19 NOTE — Progress Notes (Signed)
Pt states her left kidney hurts

## 2019-04-19 NOTE — Telephone Encounter (Signed)
Pt called stating that she has to use two 25 mg tablets of hydroxyzine TID for anxiety relief. Her current instruction are different. Will forward info to PCP for approval.

## 2019-04-19 NOTE — Patient Instructions (Signed)
Take the Ceftin antibiotics for the urinary tract infection as prescribed. Please call the urologist today to get the date for your surgical procedure.  Once you have a date please call us and let us know so that we can tell you how to adjust your Coumadin.  Your blood pressure is elevated.  We have started you on a blood pressure medication called amlodipine 5 mg daily.  We have changed your insulin to Wilber which is similar to Lantus.  You will take 70 units daily.  We have changed the NovoLog to one called Humalog.

## 2019-04-19 NOTE — Progress Notes (Signed)
Patient ID: Terri Sparks, female    DOB: April 23, 1976  MRN: [id number]  CC: Medical Clearance (Surgery )   Subjective: Terri Sparks is a 43 y.o. female who presents for medical clearance for urologic procedure. Her concerns today include:  43 year old female with history ofDM type2 with microalbumin, tob dep,PVDwith hx LLEacute limb ischemia requiring stent to the iliac artery and embolectomy followed by Dr. Zenia Resides vascular surgery, hyperparathyroidism s/p resection of parathryroid adenoma 2010, GERD,IDA,and anxiety/dep   Since last visit with me, she had a urologic procedure with she had stent removed from the right ureter.  It was replaced and one was also placed on the left side.  Urology is now planning to do cystoscopy with right ureteral stone extraction, stent exchange, laser lithotripsy.  Possible left ureteroscope with stone extraction.  This will be done under general anesthesia.  She denies any problems with general anesthesia in the past.  Patient reports some feeling of pressure in the suprapubic area with urination.  Some hematuria intermittently. -I spoke with the urology resident yesterday.  He wanted me to check a UA today and if positive to treat her with antibiotics.  He also requested that the patient call them so that they can schedule a date to do her procedure  DM: She has not been checking blood sugars because she is out of strips.   Out of all meds x 1 mth due to limit finances -this includes Lantus and NovoLog Does not have blue card.  She states that she has been trying to reach our financial counselor to get help with this.   Blood pressure noted to be elevated.  She was on low-dose of lisinopril in the past due to microalbuminuria.  However patient states it was stopped after 1 of her hospitalization.  She tries to limit salt in the foods.  History of thrombosis in the lower extremity: She is on warfarin.  She saw the clinical pharmacist today.  She  still has Lovenox at home that was left over from her last surgical procedure. She denies any easy bruising.  Occasional hematuria.  No blood in the stools.  No epistaxis.  Requesting refill on Zoloft and hydroxyzine. Patient Active Problem List   Diagnosis Date Noted  . Acute renal failure superimposed on stage 3 chronic kidney disease ([place]) 06/26/2018  . Right ureteral stone 06/26/2018  . Essential hypertension 06/26/2018  . Supratherapeutic INR 06/26/2018  . Adrenal nodule ([place]) 06/21/2018  . Lung nodules 06/21/2018  . Abnormal uterine bleeding (AUB) 05/14/2018  . Hemorrhagic cyst of right ovary 05/14/2018  . Acute blood loss anemia 05/14/2018  . Ischemia of extremity 05/12/2018  . Former smoker 03/14/2018  . Thrombosis of left iliac artery ([place]) 02/25/2018  . Thromboembolism ([place]) 02/25/2018  . Microalbuminuria 09/29/2017  . Insomnia 09/29/2017  . Immunization due 08/01/2017  . Anxiety and depression 04/28/2017  . 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 Iliac artery occlusion, left ([place]) 02/25/2017  . Tobacco abuse 02/25/2017  . Diabetes mellitus, type II ([place]) 02/25/2017  . Peripheral vascular disease of lower extremity ([place]) 02/24/2017  . GERD (gastroesophageal reflux disease)   . Hyperparathyroidism      Current Outpatient Medications on File Prior to Visit  Medication Sig Dispense Refill  . albuterol (PROAIR HFA) 108 (90 Base) MCG/ACT inhaler Inhale 2 puffs into the lungs every 6 (six) hours as needed for wheezing or shortness of breath. 18 g 0  . aspirin EC 81 MG tablet Take  81 mg by mouth daily.    . blood glucose meter kit and supplies Dispense based on patient and insurance preference. Use up to four times daily as directed. (FOR ICD-9 250.00, 250.01). 1 each 0  . Blood Glucose Monitoring Suppl (TRUE METRIX METER) w/Device KIT Use as directed 1 kit 0  . buPROPion (WELLBUTRIN SR) 150 MG 12 hr tablet Take 1 tablet (150 mg total) by mouth 2 (two) times daily.  60 tablet 0  . enoxaparin (LOVENOX) 120 MG/0.8ML injection Inject 0.8 mLs (120 mg total) into the skin every 12 (twelve) hours. (Patient taking differently: Inject 120 mg into the skin every 12 (twelve) hours. Administer 120 mg evening of 02/20/2019. Administer 120 mg Lovenox morning of 02/21/2019 and 120 mg Lovenox evening of 02/21/2019. Administer 120 mg Thursday morning 02/22/2019 and NO LOVENOX Thursday evening. No Lovenox the day of the procedure. Per orders from Clinical Pharmacist from [place]) 20 Syringe 0  . ferrous sulfate 325 (65 FE) MG tablet Take 1 tablet (325 mg total) by mouth daily with breakfast. 100 tablet 1  . gabapentin (NEURONTIN) 400 MG capsule Take 2 capsules (800 mg total) by mouth 2 (two) times daily. 120 capsule 3  . glucose blood test strip Use as instructed 100 each 12  . hydrOXYzine (ATARAX/VISTARIL) 25 MG tablet TAKE 1 TABLET BY MOUTH ONCE IN THE MORNING, 1 TABLET AT NOON, AND 2 TABLETS IN THE EVENING AS NEEDED 90 tablet 0  . Insulin Syringe-Needle U-100 (BD INSULIN SYRINGE ULTRAFINE) 31G X 5/16" 0.5 ML MISC Use as directed 100 each 3  . megestrol (MEGACE) 40 MG tablet Take 2 tablets (80 mg total) by mouth daily. 180 tablet 3  . omeprazole (PRILOSEC) 20 MG capsule Take 2 capsules (40 mg total) by mouth daily. 60 capsule 0  . ondansetron (ZOFRAN) 4 MG tablet Take 1 tablet (4 mg total) by mouth daily as needed for nausea or vomiting. 30 tablet 1  . oxybutynin (DITROPAN) 5 MG tablet Take 1 tablet (5 mg total) by mouth every 8 (eight) hours as needed for bladder spasms. 30 tablet 1  . phenazopyridine (PYRIDIUM) 200 MG tablet Take 1 tablet (200 mg total) by mouth 3 (three) times daily as needed (for pain with urination). 30 tablet 0  . polyethylene glycol (MIRALAX) packet Take 17 g by mouth daily. 28 each 0  . sodium polystyrene (KAYEXALATE) 15 GM/60ML suspension Orally 30 gm daily dispense 1 month supply 500 mL 0  . TRUEPLUS LANCETS 28G MISC Use  as directed 100 each 6   No current facility-administered medications on file prior to visit.     Allergies  Allergen Reactions  . Ciprofloxacin Hcl Hives  . Macrobid [Nitrofurantoin Macrocrystal] Hives, Shortness Of Breath and Rash  . Other Hives, Shortness Of Breath and Rash    NO "-CILLINS"!!!  . Penicillins Shortness Of Breath    Had had cephalosporins without incident Has patient had a PCN reaction causing immediate rash, facial/tongue/throat swelling, SOB or lightheadedness with hypotension: Yes Has patient had a PCN reaction causing severe rash involving mucus membranes or skin necrosis: Unk Has patient had a PCN reaction that required hospitalization: Unk Has patient had a PCN reaction occurring within the last 10 years: No If all of the above answers are "NO", then may proceed with Cephalosporin use.   . Sulfa Antibiotics Hives, Shortness Of Breath and Rash  . Dilaudid [Hydromorphone Hcl] Other (See Comments)    Asystole per pt report  .  Lexapro [Escitalopram Oxalate] Other (See Comments)    "I just did not like it."    Social History   Socioeconomic History  . Marital status: Single    Spouse name: Not on file  . Number of children: Not on file  . Years of education: Not on file  . Highest education level: Not on file  Occupational History  . Not on file  Social Needs  . Financial resource strain: Not on file  . Food insecurity    Worry: Not on file    Inability: Not on file  . Transportation needs    Medical: Not on file    Non-medical: Not on file  Tobacco Use  . Smoking status: Current Some Day Smoker    Packs/day: 0.25    Years: 15.00    Pack years: 3.75    Types: Cigarettes  . Smokeless tobacco: Never Used  Substance and Sexual Activity  . Alcohol use: Yes    Comment: occassionally  . Drug use: Yes    Types: Marijuana    Comment: once daily- last time used was night of 02/21/2019  . Sexual activity: Yes    Partners: Male    Birth  control/protection: Other-see comments    Comment: BTL  Lifestyle  . Physical activity    Days per week: Not on file    Minutes per session: Not on file  . Stress: Not on file  Relationships  . Social Herbalist on phone: Not on file    Gets together: Not on file    Attends religious service: Not on file    Active member of club or organization: Not on file    Attends meetings of clubs or organizations: Not on file    Relationship status: Not on file  . Intimate partner violence    Fear of current or ex partner: Not on file    Emotionally abused: Not on file    Physically abused: Not on file    Forced sexual activity: Not on file  Other Topics Concern  . Not on file  Social History Narrative  . Not on file    Family History  Problem Relation Age of Onset  . Depression Mother   . Diabetes Father   . Heart failure Father     Past Surgical History:  Procedure Laterality Date  . ABDOMINAL AORTOGRAM W/LOWER EXTREMITY N/A 11/14/2017   Procedure: ABDOMINAL AORTOGRAM W/LOWER EXTREMITY;  Surgeon: Waynetta Sandy, MD;  Location: [facility];  Service: Cardiovascular;  Laterality: N/A;  . ANGIOPLASTY ILLIAC ARTERY Left 02/25/2018   Procedure: BALLOON ANGIOPLASTY LEFT ILIAC ARTERY;  Surgeon: Conrad Dry Prong, MD;  Location: [facility];  Service: Vascular;  Laterality: Left;  . APPLICATION OF WOUND VAC Left 03/03/2018   Procedure: APPLICATION OF WOUND VAC;  Surgeon: Angelia Mould, MD;  Location: [facility];  Service: Vascular;  Laterality: Left;  . CYSTOSCOPY W/ URETERAL STENT PLACEMENT Right 06/27/2018   Procedure: CYSTOSCOPY WITH RETROGRADE PYELOGRAM/URETERAL DOUBLE J STENT PLACEMENT;  Surgeon: Ceasar Mons, MD;  Location: [facility];  Service: Urology;  Laterality: Right;  . CYSTOSCOPY WITH STENT PLACEMENT N/A 02/23/2019   Procedure: CYSTOSCOPY WITH RIGHT URETERAL STENT EXCHANGE/ LEFT URETERAL STENT PLACEMENT;  Surgeon: Ceasar Mons, MD;   Location: [facility];  Service: Urology;  Laterality: N/A;  . EMBOLECTOMY Left 02/24/2017   Procedure: Left Lower Extremity Embolectomy and Angiogram.;  Surgeon: Waynetta Sandy, MD;  Location: [facility];  Service: Vascular;  Laterality: Left;  . INSERTION OF ILIAC STENT  02/24/2017   Procedure: INSERTION OF Common ILIAC STENT;  Surgeon: Waynetta Sandy, MD;  Location: [facility];  Service: Vascular;;  . INTRAOPERATIVE ARTERIOGRAM Left 02/25/2018   Procedure: INTRA OPERATIVE ARTERIOGRAM WITH LEFT LEG RUNOFF;  Surgeon: Conrad Cushman, MD;  Location: [facility];  Service: Vascular;  Laterality: Left;  . LOWER EXTREMITY ANGIOGRAM Left 02/24/2017   Procedure: Aortagram, Left lower extremity Run-off;  Surgeon: Waynetta Sandy, MD;  Location: [facility];  Service: Vascular;  Laterality: Left;  . PATCH ANGIOPLASTY Left 02/25/2018   Procedure: PATCH ANGIOPLASTY LEFT SUPERFICIAL FEMORAL ARTERY WITH BOVINE PATCH;  Surgeon: Conrad [place] Lake, MD;  Location: [facility];  Service: Vascular;  Laterality: Left;  . PERCUTANEOUS VENOUS THROMBECTOMY,LYSIS WITH INTRAVASCULAR ULTRASOUND (IVUS) Left 05/12/2018   Procedure: MECHANICAL THROMBECTOMY LEFT LEG, BALLOON ANGIOPLASTY LEFT ANTERIOR TIBIAL ARTERY, AORTOGRAM WITH LEFT LEG RUNOFF;  Surgeon: Serafina Mitchell, MD;  Location: [facility];  Service: Vascular;  Laterality: Left;  . 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 THROMBECTOMY FEMORAL ARTERY Left 02/25/2018   Procedure: LEFT ILIAC AND POPLITEAL ARTERY THROMBECTOMY;  Surgeon: Conrad Greer, MD;  Location: [facility];  Service: Vascular;  Laterality: Left;  . TUBAL LIGATION    . WOUND DEBRIDEMENT Left 03/03/2018   Procedure: DEBRIDEMENT WOUND;  Surgeon: Angelia Mould, MD;  Location: [facility];  Service: Vascular;  Laterality: Left;  . WOUND EXPLORATION Left 03/03/2018   Procedure: WOUND EXPLORATION;  Surgeon: Angelia Mould, MD;  Location[facility];  Service: Vascular;  Laterality: Left;    ROS: Review of Systems  Negative except as stated above  PHYSICAL EXAM: BP (!) 143/90   Pulse 99   Temp 98 F (36.7 C) (Oral)   Resp 16   Wt 276 lb 12.8 oz (125.6 kg)   SpO2 98%   BMI 43.35 kg/m   Physical Exam  General appearance - alert, well appearing, and in no distress Mental status - normal mood, behavior, speech, dress, motor activity, and thought processes Nose - normal and patent, no erythema, discharge or polyps Mouth - mucous membranes moist, pharynx normal without lesions Neck - supple, no significant adenopathy Chest - clear to auscultation, no wheezes, rales or rhonchi, symmetric air entry Heart - normal rate, regular rhythm, normal S1, S2, no murmurs, rubs, clicks or gallops Extremities - peripheral pulses normal, no pedal edema, no clubbing or cyanosis  Results for orders placed or performed in visit on 04/19/19  POCT glucose (manual entry)  Result Value Ref Range   POC Glucose 235 (A) 70 - 99 mg/dl  INR  Result Value Ref Range   INR 2.7 2.0 - 3.0  POCT URINALYSIS DIP (CLINITEK)  Result Value Ref Range   Color, UA yellow yellow   Clarity, UA cloudy (A) clear   Glucose, UA =100 (A) negative mg/dL   Bilirubin, UA negative negative   Ketones, POC UA negative negative mg/dL   Spec Grav, UA 1.020 1.010 - 1.025   Blood, UA large (A) negative   pH, UA 6.0 5.0 - 8.0   POC PROTEIN,UA =100 (A) negative, trace   Urobilinogen, UA 0.2 0.2 or 1.0 E.U./dL   Nitrite, UA Positive (A) Negative   Leukocytes, UA Large (3+) (A) Negative    CMP Latest Ref Rng & Units 02/22/2019 09/29/2018 07/25/2018  Glucose 70 - 99 mg/dL 258(H) 668(HH) 115(H)  BUN 6 - 20 mg/dL 19 17 26(H)  Creatinine  0.44 - 1.00 mg/dL 2.16(H) 1.97(H) 2.09(H)  Sodium 135 - 145 mmol/L 135 126(L) 137  Potassium 3.5 - 5.1 mmol/L 4.2 3.8 4.9  Chloride 98 - 111 mmol/L 101 92(L) 107(H)  CO2 22 - 32 mmol/L 25 22 14(L)  Calcium 8.9 - 10.3 mg/dL 10.0 9.2 10.7(H)  Total Protein 6.5 - 8.1 g/dL - - -  Total Bilirubin 0.3 - 1.2 mg/dL -  - -  Alkaline Phos 38 - 126 U/L - - -  AST 15 - 41 U/L - - -  ALT 0 - 44 U/L - - -   Lipid Panel     Component Value Date/Time   CHOL 84 06/27/2018 0126   CHOL 151 01/27/2018 1157   TRIG 233 (H) 06/27/2018 0126   HDL 13 (L) 06/27/2018 0126   HDL 36 (L) 01/27/2018 1157   CHOLHDL 6.5 06/27/2018 0126   VLDL 47 (H) 06/27/2018 0126   LDLCALC 24 06/27/2018 0126   LDLCALC 82 01/27/2018 1157    CBC    Component Value Date/Time   WBC 10.3 02/22/2019 0932   RBC 3.02 (L) 02/22/2019 0932   HGB 8.5 (L) 02/22/2019 0932   HGB 12.3 07/25/2018 1711   HCT 29.2 (L) 02/22/2019 0932   HCT 37.8 07/25/2018 1711   PLT 521 (H) 02/22/2019 0932   PLT 490 (H) 01/27/2018 1157   MCV 96.7 02/22/2019 0932   MCV 87 07/25/2018 1711   MCH 28.1 02/22/2019 0932   MCHC 29.1 (L) 02/22/2019 0932   RDW 14.7 02/22/2019 0932   RDW 16.4 (H) 07/25/2018 1711   LYMPHSABS 3.4 (H) 07/25/2018 1711   MONOABS 0.6 06/27/2018 0126   EOSABS 0.6 (H) 07/25/2018 1711   BASOSABS 0.1 07/25/2018 1711   EKG: Normal sinus rhythm with no ischemic changes.  ASSESSMENT AND PLAN: 1. Preoperative evaluation to rule out surgical contraindication -Stable able to proceed. We will treat her bladder infection today with antibiotics.  She will pick this up from the pharmacy before she leaves. Patient instructed to call [facility] so that a day could be scheduled.  Once she gets a date she will call us back and let us know for instructions on when to stop Coumadin and when to start Lovenox  2. 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  We will try to get her her insulin today.  Lantus will have to be changed to Basaglar and NovoLog to Humalog in order to get samples. - POCT glucose (manual entry) - Insulin Glargine (BASAGLAR KWIKPEN) 100 UNIT/ML SOPN; Inject 0.7 mLs (70 Units total) into the skin daily.  Dispense: 5 pen; Refill: 6 -  insulin lispro (HUMALOG KWIKPEN) 100 UNIT/ML KwikPen; inj 5-15 units TID with meals subcut  Dispense: 15 mL; Refill: 11 - Insulin Pen Needle 31G X 5 MM MISC; Use with Lantus and Novolog injections.  Dispense: 100 each; Refill: 11  3. Thrombosis of left iliac artery ([facility]) Seen by clinical pharmacist today.  Again we will instruct on when to stop Coumadin and when to start and stop Lovenox based on the date of her procedure - INR - warfarin (COUMADIN) 5 MG tablet; TAKE 2 TABLETS ON MONDAY, WEDNESDAY AND FRIDAY WITH 1 TABLET ALL OTHER DAYS.  Dispense: 60 tablet; Refill: 3  4. Dysuria - POCT URINALYSIS DIP (CLINITEK) - Urine Culture - cefUROXime (CEFTIN) 500 MG tablet; Take 1 tablet (500 mg total) by mouth 2 (two) times daily with  a meal.  Dispense: 14 tablet; Refill: 0  5. Anxiety - sertraline (ZOLOFT) 50 MG tablet; Take 1 tablet (50 mg total) by mouth daily. 1/2 tab daily x 2 wks then 1 tab PO daily  Dispense: 30 tablet; Refill: 3  6. Essential hypertension I have started her on a low-dose of amlodipine.  DASH diet discussed and encouraged - amLODipine (NORVASC) 5 MG tablet; Take 1 tablet (5 mg total) by mouth daily.  Dispense: 30 tablet; Refill: 3   Patient was given the opportunity to ask questions.  Patient verbalized understanding of the plan and was able to repeat key elements of the plan.   Orders Placed This Encounter  Procedures  . Urine Culture  . POCT glucose (manual entry)  . INR  . POCT URINALYSIS DIP (CLINITEK)     Requested Prescriptions   Signed Prescriptions Disp Refills  . sertraline (ZOLOFT) 50 MG tablet 30 tablet 3    Sig: Take 1 tablet (50 mg total) by mouth daily. 1/2 tab daily x 2 wks then 1 tab PO daily  . warfarin (COUMADIN) 5 MG tablet 60 tablet 3    Sig: TAKE 2 TABLETS ON MONDAY, WEDNESDAY AND FRIDAY WITH 1 TABLET ALL OTHER DAYS.  . cefUROXime (CEFTIN) 500 MG tablet 14 tablet 0    Sig: Take 1 tablet (500 mg total) by mouth 2 (two) times daily with a  meal.  . amLODipine (NORVASC) 5 MG tablet 30 tablet 3    Sig: Take 1 tablet (5 mg total) by mouth daily.  . Insulin Glargine (BASAGLAR KWIKPEN) 100 UNIT/ML SOPN 5 pen 6    Sig: Inject 0.7 mLs (70 Units total) into the skin daily.  . insulin lispro (HUMALOG KWIKPEN) 100 UNIT/ML KwikPen 15 mL 11    Sig: inj 5-15 units TID with meals subcut  . Insulin Pen Needle 31G X 5 MM MISC 100 each 11    Sig: Use with Lantus and Novolog injections.    Return in about 3 months (around 07/20/2019).  Karle Plumber, MD, FACP

## 2019-04-20 ENCOUNTER — Telehealth: Payer: Self-pay | Admitting: Pharmacist

## 2019-04-20 NOTE — Telephone Encounter (Signed)
Call received from patient. 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 She has an upcoming procedure on 05/01/19. She has required perioperative bridging with LMWH before and will require it again for this procedure.   Recommend to follow same bridging procedure. Discussed with the patient that I will get final verification from Dr. Wynetta Emery. If we follow standard bridging procedure, pt will need to begin holding Coumadin on 04/26/19 (day -5) and commence therapeutic BID Lovenox on 7/25 (day -3). Her last injection should be Monday morning (7/27) before her procedure on the 28th.   She is to come see me Monday (7/27) to verify that her INR is < 1.5. She can resume Lovenox and warfarin post-op as deemed stable by the proceduralist.   Will send to Dr. Wynetta Emery.

## 2019-04-23 ENCOUNTER — Telehealth: Payer: Self-pay | Admitting: Internal Medicine

## 2019-04-23 LAB — URINE CULTURE

## 2019-04-23 MED ORDER — DOXYCYCLINE HYCLATE 100 MG PO TABS: 100.0000 mg | ORAL_TABLET | Freq: Two times a day (BID) | ORAL | 0 refills | Status: DC

## 2019-04-23 MED FILL — DOXYCYCLINE HYCLATE 100 MG: 100 | 7 days supply | Qty: 14 | Fill #0

## 2019-04-23 NOTE — Telephone Encounter (Signed)
Phone call placed to patient this morning.  I left message on her voicemail informing her that her urine culture came back showing that the organism is resistant to the current antibiotics that she is taking.  I have informed her to discontinue that antibiotics.  I have sent a prescription to the pharmacy for doxycycline.  Encourage her to pick up the prescription today and start taking so that she would be treated prior to her procedure.

## 2019-04-27 ENCOUNTER — Other Ambulatory Visit: Payer: Self-pay | Admitting: Urology

## 2019-04-27 ENCOUNTER — Encounter (HOSPITAL_COMMUNITY): Payer: Self-pay

## 2019-04-27 ENCOUNTER — Inpatient Hospital Stay (HOSPITAL_COMMUNITY): Admission: RE | Admit: 2019-04-27 | Payer: Medicaid Other | Source: Ambulatory Visit

## 2019-04-27 NOTE — Pre-Procedure Instructions (Signed)
Requested per op orders from [facility] at Dr. Jackson Latino office to be placed in epic.

## 2019-04-27 NOTE — Pre-Procedure Instructions (Signed)
The following are in epic: Medical clearance Dr. Wynetta Emery  04/19/2019 EKG 04/19/2019 ECHO 07/03/2018 CXR 06/26/2018

## 2019-04-27 NOTE — Patient Instructions (Addendum)
You have completed your COVID test.   PLEASE FOLLOW ALL THE QUARANTINE  INSTRUCTIONS GIVEN IN YOUR HANDOUT.      Your procedure is scheduled on  __[phone]__________  Report to [place] AT 5:30am    Call this number if you have problems the morning of surgery  [phone].   OUR ADDRESS IS [place].  WE ARE LOCATED IN [facility].                                     REMEMBER:  DO NOT EAT FOOD OR DRINK LIQUIDS AFTER MIDNIGHT .    TAKE THESE MEDICATIONS MORNING OF SURGERY WITH A SIP OF WATER:  _Amlodipine, Bupropion, Gabapentin, Omeprazole, Sertraline, Hydroxyzine. Bring Asthma Inhaler day of surgery  Brush your teeth the morning of surgery.   IF YOU ARE SPENDING THE NIGHT AFTER SURGERY PLEASE BRING ALL YOUR PRESCRIPTION MEDICATIONS IN THEIR ORIGINAL BOTTLES.                                    DO NOT WEAR JEWERLY, MAKE UP, OR NAIL POLISH,  DO NOT WEAR LOTIONS, POWDERS, PERFUMES OR DEODORANT. DO NOT SHAVE FOR 24 HOURS PRIOR TO DAY OF SURGERY. MEN MAY SHAVE FACE AND NECK. CONTACTS, GLASSES, OR DENTURES MAY NOT BE WORN TO SURGERY.                                    [facility] IS NOT RESPONSIBLE  FOR ANY BELONGINGS.                                                                    .    Do NOT smoke after Midnight     Patients discharged the day of surgery will not be allowed to drive home.   Special Instructions: Bring a copy of your healthcare power of attorney and living will documents         the day of surgery if you haven't scanned them in before.              Please read over the following fact sheets you were given:   How to Manage Your Diabetes Before and After Surgery  Why is it important to control my blood sugar before and after surgery? . Improving blood sugar levels before and after surgery helps healing and can limit problems. . A way of improving blood sugar control is eating a healthy diet by: o  Eating  less sugar and carbohydrates o  Increasing activity/exercise o  Talking with your doctor about reaching your blood sugar goals . High blood sugars (greater than 180 mg/dL) can raise your risk of infections and slow your recovery, so you will need to focus on controlling your diabetes during the weeks before surgery. . Make sure that the doctor who takes care of your diabetes knows about your planned surgery including the date and location.  How do I manage my blood sugar before surgery? . Check  your blood sugar at least 4 times a day, starting 2 days before surgery, to make sure that the level is not too high or low. o Check your blood sugar the morning of your surgery when you wake up and every 2 hours until you get to the Short Stay unit. . If your blood sugar is less than 70 mg/dL, you will need to treat for low blood sugar: o Do not take insulin. o Treat a low blood sugar (less than 70 mg/dL) with  cup of clear juice (cranberry or apple), 4 glucose tablets, OR glucose gel. o Recheck blood sugar in 15 minutes after treatment (to make sure it is greater than 70 mg/dL). If your blood sugar is not greater than 70 mg/dL on recheck, call [phone] for further instructions. . Report your blood sugar to the short stay nurse when you get to Short Stay.  . If you are admitted to the hospital after surgery: o Your blood sugar will be checked by the staff and you will probably be given insulin after surgery (instead of oral diabetes medicines) to make sure you have good blood sugar levels. o The goal for blood sugar control after surgery is 80-180 mg/dL.   WHAT DO I DO ABOUT MY DIABETES MEDICATION?  Marland Kitchen Do not take oral diabetes medicines (pills) the morning of surgery.  The day before surgery take Novolog as usual with meals but no bedtime dose  . THE NIGHT BEFORE SURGERY, take    36.5 units of Lantus  Insulin at bedtime.       . The day of surgery, do not take other diabetes injectables,  including Byetta (exenatide), Bydureon (exenatide ER), Victoza (liraglutide), or Trulicity (dulaglutide).  . If your CBG is greater than 220 mg/dL, you may take  of your sliding scale  . (correction) dose of insulin.      Patient Signature:  Date:   Nurse Signature:  Date:   Reviewed and Endorsed by [facility] Patient Education Committee, August 2015    [facility] - Preparing for Surgery Before surgery, you can play an important role.  Because skin is not sterile, your skin needs to be as free of germs as possible.  You can reduce the number of germs on your skin by washing with CHG (chlorahexidine gluconate) soap before surgery.  CHG is an antiseptic cleaner which kills germs and bonds with the skin to continue killing germs even after washing. Please DO NOT use if you have an allergy to CHG or antibacterial soaps.  If your skin becomes reddened/irritated stop using the CHG and inform your nurse when you arrive at Short Stay. Do not shave (including legs and underarms) for at least 48 hours prior to the first CHG shower.  You may shave your face/neck.   Please follow these instructions carefully:  1.  Shower with CHG Soap the night before surgery and the  morning of surgery.  2.  If you choose to wash your hair, wash your hair first as usual with your normal  shampoo.  3.  After you shampoo, rinse your hair and body thoroughly to remove the shampoo.                             4.  Use CHG as you would any other liquid soap.  You can apply chg directly to the skin and wash.  Gently with a scrungie or clean washcloth.  5.  Apply the CHG Soap to your body ONLY FROM THE NECK DOWN.   Do   not use on face/ open                           Wound or open sores. Avoid contact  with eyes, ears mouth and   genitals (private parts).                       Wash face,  Genitals (private parts) with your normal soap.             6.  Wash thoroughly, paying special attention to the area where your     surgery  will be performed.  7.  Thoroughly rinse your body with warm water from the neck down.  8.  DO NOT shower/wash with your normal soap after using and rinsing off the CHG Soap.                9.  Pat yourself dry with a clean towel.            10.  Wear clean pajamas.            11.  Place clean sheets on your bed the night of your first shower and do not  sleep with pets. Day of Surgery : Do not apply any lotions/deodorants the morning of surgery.  Please wear clean clothes to the hospital/surgery center.  FAILURE TO FOLLOW THESE INSTRUCTIONS MAY RESULT IN THE CANCELLATION OF YOUR SURGERY  PATIENT SIGNATURE_________________________________  NURSE SIGNATURE__________________________________  ________________________________________________________________________

## 2019-04-28 ENCOUNTER — Other Ambulatory Visit (HOSPITAL_COMMUNITY): Payer: Medicaid Other

## 2019-04-30 ENCOUNTER — Encounter (HOSPITAL_COMMUNITY)
Admission: RE | Admit: 2019-04-30 | Discharge: 2019-04-30 | Disposition: A | Discharge status: Home / Self Care | Payer: Medicaid Other | Source: Ambulatory Visit | Attending: Urology | Admitting: Urology

## 2019-04-30 ENCOUNTER — Other Ambulatory Visit: Payer: Self-pay | Admitting: Urology

## 2019-04-30 ENCOUNTER — Other Ambulatory Visit (HOSPITAL_COMMUNITY)
Admission: RE | Admit: 2019-04-30 | Discharge: 2019-04-30 | Disposition: A | Discharge status: Home / Self Care | Payer: Medicaid Other | Source: Ambulatory Visit | Attending: Urology | Admitting: Urology

## 2019-04-30 ENCOUNTER — Other Ambulatory Visit: Payer: Self-pay

## 2019-04-30 ENCOUNTER — Encounter (HOSPITAL_COMMUNITY): Payer: Self-pay

## 2019-04-30 DIAGNOSIS — Z1159 Encounter for screening for other viral diseases: Secondary | ICD-10-CM | POA: Insufficient documentation

## 2019-04-30 DIAGNOSIS — F419 Anxiety disorder, unspecified: Secondary | ICD-10-CM | POA: Insufficient documentation

## 2019-04-30 DIAGNOSIS — E1151 Type 2 diabetes mellitus with diabetic peripheral angiopathy without gangrene: Secondary | ICD-10-CM | POA: Insufficient documentation

## 2019-04-30 DIAGNOSIS — Z794 Long term (current) use of insulin: Secondary | ICD-10-CM | POA: Insufficient documentation

## 2019-04-30 DIAGNOSIS — R9431 Abnormal electrocardiogram [ECG] [EKG]: Secondary | ICD-10-CM | POA: Insufficient documentation

## 2019-04-30 DIAGNOSIS — F329 Major depressive disorder, single episode, unspecified: Secondary | ICD-10-CM | POA: Insufficient documentation

## 2019-04-30 DIAGNOSIS — K219 Gastro-esophageal reflux disease without esophagitis: Secondary | ICD-10-CM | POA: Insufficient documentation

## 2019-04-30 DIAGNOSIS — Z7982 Long term (current) use of aspirin: Secondary | ICD-10-CM | POA: Insufficient documentation

## 2019-04-30 DIAGNOSIS — Z79899 Other long term (current) drug therapy: Secondary | ICD-10-CM | POA: Insufficient documentation

## 2019-04-30 DIAGNOSIS — I10 Essential (primary) hypertension: Secondary | ICD-10-CM | POA: Insufficient documentation

## 2019-04-30 DIAGNOSIS — Z01812 Encounter for preprocedural laboratory examination: Secondary | ICD-10-CM | POA: Insufficient documentation

## 2019-04-30 DIAGNOSIS — D509 Iron deficiency anemia, unspecified: Secondary | ICD-10-CM | POA: Insufficient documentation

## 2019-04-30 DIAGNOSIS — F17200 Nicotine dependence, unspecified, uncomplicated: Secondary | ICD-10-CM | POA: Insufficient documentation

## 2019-04-30 DIAGNOSIS — E1165 Type 2 diabetes mellitus with hyperglycemia: Secondary | ICD-10-CM | POA: Insufficient documentation

## 2019-04-30 DIAGNOSIS — N200 Calculus of kidney: Secondary | ICD-10-CM | POA: Insufficient documentation

## 2019-04-30 DIAGNOSIS — Z7901 Long term (current) use of anticoagulants: Secondary | ICD-10-CM | POA: Insufficient documentation

## 2019-04-30 HISTORY — DX: Peripheral vascular disease, unspecified: I73.9

## 2019-04-30 HISTORY — DX: Other noninflammatory disorders of ovary, fallopian tube and broad ligament: N83.8

## 2019-04-30 HISTORY — DX: Nontoxic single thyroid nodule: E04.1

## 2019-04-30 HISTORY — DX: Depression, unspecified: F32.A

## 2019-04-30 HISTORY — DX: Iron deficiency anemia, unspecified: D50.9

## 2019-04-30 HISTORY — DX: Disorder of arteries and arterioles, unspecified: I77.9

## 2019-04-30 HISTORY — DX: Other specified disorders of teeth and supporting structures: K08.89

## 2019-04-30 LAB — URINALYSIS, ROUTINE W REFLEX MICROSCOPIC
Bilirubin Urine: NEGATIVE
Glucose, UA: >=500 mg/dL — AB
Ketones, ur: NEGATIVE mg/dL
Nitrite: NEGATIVE
Protein, ur: 100 mg/dL — AB
RBC / HPF: >50 RBC/hpf — ABNORMAL HIGH (ref 0–5)
Specific Gravity, Urine: 1.009 (ref 1.005–1.030)
WBC, UA: >50 WBC/hpf — ABNORMAL HIGH (ref 0–5)
pH: 6 (ref 5.0–8.0)

## 2019-04-30 LAB — CBC
HCT: 45.6 % (ref 36.0–46.0)
Hemoglobin: 14.2 g/dL (ref 12.0–15.0)
MCH: 29.2 pg (ref 26.0–34.0)
MCHC: 31.1 g/dL (ref 30.0–36.0)
MCV: 93.6 fL (ref 80.0–100.0)
Platelets: 377 10*3/uL (ref 150–400)
RBC: 4.87 MIL/uL (ref 3.87–5.11)
RDW: 17.3 % — ABNORMAL HIGH (ref 11.5–15.5)
WBC: 11.4 10*3/uL — ABNORMAL HIGH (ref 4.0–10.5)
nRBC: 0 % (ref 0.0–0.2)

## 2019-04-30 LAB — BASIC METABOLIC PANEL
Anion gap: 10 (ref 5–15)
BUN: 17 mg/dL (ref 6–20)
CO2: 22 mmol/L (ref 22–32)
Calcium: 9.5 mg/dL (ref 8.9–10.3)
Chloride: 104 mmol/L (ref 98–111)
Creatinine, Ser: 1.76 mg/dL — ABNORMAL HIGH (ref 0.44–1.00)
GFR calc Af Amer: 40 mL/min — ABNORMAL LOW (ref >60)
GFR calc non Af Amer: 35 mL/min — ABNORMAL LOW (ref >60)
Glucose, Bld: 242 mg/dL — ABNORMAL HIGH (ref 70–99)
Potassium: 4.3 mmol/L (ref 3.5–5.1)
Sodium: 136 mmol/L (ref 135–145)

## 2019-04-30 LAB — HEMOGLOBIN A1C
Hgb A1c MFr Bld: 7.5 % — ABNORMAL HIGH (ref 4.8–5.6)
Mean Plasma Glucose: 168.55 mg/dL

## 2019-04-30 LAB — PROTIME-INR
INR: 0.9 (ref 0.8–1.2)
Prothrombin Time: 12 seconds (ref 11.4–15.2)

## 2019-04-30 LAB — GLUCOSE, CAPILLARY: Glucose-Capillary: 259 mg/dL — ABNORMAL HIGH (ref 70–99)

## 2019-04-30 NOTE — Progress Notes (Signed)
Call and spoke w/ pt to make covid appt today @ 1400 prior to her PAT appt today at [facility].  Pt verbalized understanding directions to go to covid test at [facility] and [facility] hospital.

## 2019-04-30 NOTE — Progress Notes (Signed)
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 Harrell Gave Lovena Neighbours.   Pt given detailed instructions on stopping Coumadin and started Lovenox bridge. Per Pharmacist, Vinson Moselle Ausdall, pt to hold Coumadin 04/26/2019 and start BID Lovenox on 7/25, last injection Monday morning before procedure on Tuesday 7/28.  Pt last seen by PCP, Dr. Karle Plumber, 04/19/2019.  Bladder infection treated at this visit.  Per OV note, "Stable to proceed."  S/p cystoscopy 02/23/2928 with no anesthesia complications noted.   Pt treated for UTI by PCP with Ceftin 04/19/2019.  Switched to doxycycline on 04/23/2019 after urine culture resulted. Pt reports at PAT visit 04/30/2019 she never started doxycycline.  Dr. Lovena Neighbours informed.  Requested UA repeated at PAT visit and advised pt to start doxycycline.  VS: There were no vitals taken for this visit.  PROVIDERS: Ladell Pier, MD is PCP    LABS: Labs reviewed: Acceptable for surgery. (all labs ordered are listed, but only abnormal results are displayed)  Labs Reviewed  GLUCOSE, CAPILLARY - Abnormal; Notable for the following components:      Result Value   Glucose-Capillary 259 (*)    All other components within normal limits  BASIC METABOLIC PANEL - Abnormal; Notable for the following components:   Glucose, Bld 242 (*)    Creatinine, Ser 1.76 (*)    GFR calc non Af Amer 35 (*)    GFR  calc Af Amer 40 (*)    All other components within normal limits  CBC - Abnormal; Notable for the following components:   WBC 11.4 (*)    RDW 17.3 (*)    All other components within normal limits  HEMOGLOBIN A1C - Abnormal; Notable for the following components:   Hgb A1c MFr Bld 7.5 (*)    All other components within normal limits  URINALYSIS, ROUTINE W REFLEX MICROSCOPIC - Abnormal; Notable for the following components:   APPearance TURBID (*)    Glucose, UA >=500 (*)    Hgb urine dipstick MODERATE (*)    Protein, ur 100 (*)    Leukocytes,Ua LARGE (*)    RBC / HPF >50 (*)    WBC, UA >50 (*)    Bacteria, UA MANY (*)    All other components within normal limits  PROTIME-INR     IMAGES: CT Abdomen Pelvis 02/07/2019 IMPRESSION: 1. Right-sided double-J ureteral stent is positioned with formed pigtails in the right renal pelvis and urinary bladder. No hydronephrosis. There are multiple nonobstructive calculi bilaterally, unchanged in size and configuration. Redemonstrated calyceal air, as well as air within the urinary bladder, of uncertain etiology, presumably due to catheterization.  2. Other chronic and incidental findings as detailed above.  EKG: 04/19/2019 Rate 93 bpm Normal sinus rhythm   CV: Echo 06/26/18 Study Conclusions  - Left ventricle: The cavity size was normal. Wall thickness was normal. Systolic function was mildly reduced. The estimated ejection fraction was in the range of 45% to 50%.  Impressions:  - Mild  LV dysfunction. 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 There appears to be a false lumen and a small true lumen This is very suspicious for a Type A aortic dissection. Suggest STAT CT angiogram  CT Angio 06/26/18 IMPRESSION: 1. Bilateral pyelonephritis with emphysematous pyelitis. Gas within the bilateral renal collecting systems and urinary bladder. 2. Bilateral renal calculi with 6 mm stone in the proximal  right ureter. Mild right hydronephrosis. 3. Negative for an aortic dissection. Limited evaluation of the abdominopelvic vascular structures. The left common iliac artery stent appears to be patent. 4. Extensive scarring in the left kidney. Past Medical History:  Diagnosis Date  . Anxiety   . Depression   . Diabetes ([place])    Type 2  . GERD (gastroesophageal reflux disease)   . History of blood clots    ,DVT-left leg, and early 2000, had blood clot behind left breast  . History of kidney stones   . Hypercalcemia   . Hyperparathyroidism   . Hypertension    had been on Lisinopril and PCP took her off it 4 months ago  . Iron deficiency anemia   . Left thyroid nodule   . Mass of right ovary   . Nephrolithiasis   . PAOD (peripheral arterial occlusive disease) ([place])   . Peripheral vascular disease ([place])   . Prolonged Q-T interval on ECG 04/19/2019  . Renal calculi     Past Surgical History:  Procedure Laterality Date  . ABDOMINAL AORTOGRAM W/LOWER EXTREMITY N/A 11/14/2017   Procedure: ABDOMINAL AORTOGRAM W/LOWER EXTREMITY;  Surgeon: Waynetta Sandy, MD;  Location: [place] [facility];  Service: Cardiovascular;  Laterality: N/A;  . ANGIOPLASTY ILLIAC ARTERY Left 02/25/2018   Procedure: BALLOON ANGIOPLASTY LEFT ILIAC ARTERY;  Surgeon: Conrad [place], MD;  Location: [place];  Service: Vascular;  Laterality: Left;  . APPLICATION OF WOUND VAC Left 03/03/2018   Procedure: APPLICATION OF WOUND VAC;  Surgeon: Angelia Mould, MD;  Location: [place];  Service: Vascular;  Laterality: Left;  . CYSTOSCOPY W/ URETERAL STENT PLACEMENT Right 06/27/2018   Procedure: CYSTOSCOPY WITH RETROGRADE PYELOGRAM/URETERAL DOUBLE J STENT PLACEMENT;  Surgeon: Ceasar Mons, MD;  Location: [place];  Service: Urology;  Laterality: Right;  . CYSTOSCOPY WITH STENT PLACEMENT N/A 02/23/2019   Procedure: CYSTOSCOPY WITH RIGHT URETERAL STENT EXCHANGE/ LEFT URETERAL STENT PLACEMENT;  Surgeon: Ceasar Mons, MD;  Location: [facility];  Service: Urology;  Laterality: N/A;  . EMBOLECTOMY Left 02/24/2017   Procedure: Left Lower Extremity Embolectomy and Angiogram.;  Surgeon: Waynetta Sandy, MD;  Location: [place];  Service: Vascular;  Laterality: Left;  . INSERTION OF ILIAC STENT  02/24/2017   Procedure: INSERTION OF Common ILIAC STENT;  Surgeon: Waynetta Sandy, MD;  Location: [place];  Service: Vascular;;  . INTRAOPERATIVE ARTERIOGRAM Left 02/25/2018   Procedure: INTRA OPERATIVE ARTERIOGRAM WITH LEFT LEG RUNOFF;  Surgeon: Conrad [place], MD;  Location: [place];  Service: Vascular;  Laterality: Left;  . LOWER EXTREMITY ANGIOGRAM Left 02/24/2017   Procedure: Aortagram, Left lower extremity Run-off;  Surgeon: Waynetta Sandy, MD;  Location: [place];  Service: Vascular;  Laterality: Left;  . PATCH ANGIOPLASTY Left 02/25/2018   Procedure: PATCH ANGIOPLASTY LEFT SUPERFICIAL FEMORAL ARTERY WITH BOVINE PATCH;  Surgeon: Conrad [place], MD;  Location: [place];  Service: Vascular;  Laterality: Left;  . PERCUTANEOUS VENOUS THROMBECTOMY,LYSIS WITH INTRAVASCULAR ULTRASOUND (IVUS) Left 05/12/2018   Procedure: MECHANICAL THROMBECTOMY LEFT LEG, BALLOON ANGIOPLASTY LEFT ANTERIOR TIBIAL  ARTERY, AORTOGRAM WITH LEFT LEG RUNOFF;  Surgeon: Serafina Mitchell, MD;  Location: [facility];  Service: Vascular;  Laterality: Left;  . 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 THROMBECTOMY FEMORAL ARTERY Left 02/25/2018   Procedure: LEFT ILIAC AND POPLITEAL ARTERY THROMBECTOMY;  Surgeon: Conrad Diamond, MD;  Location: [place];  Service: Vascular;  Laterality: Left;  . TUBAL LIGATION    . WOUND DEBRIDEMENT Left 03/03/2018   Procedure: DEBRIDEMENT WOUND;  Surgeon: Angelia Mould, MD;  Location: [place];  Service: Vascular;  Laterality: Left;  . WOUND EXPLORATION Left 03/03/2018   Procedure: WOUND EXPLORATION;  Surgeon: Angelia Mould, MD;  Location: [facility] OR;  Service: Vascular;  Laterality: Left;     MEDICATIONS: . albuterol (PROAIR HFA) 108 (90 Base) MCG/ACT inhaler  . amLODipine (NORVASC) 5 MG tablet  . aspirin EC 81 MG tablet  . blood glucose meter kit and supplies  . Blood Glucose Monitoring Suppl (TRUE METRIX METER) w/Device KIT  . buPROPion (WELLBUTRIN SR) 150 MG 12 hr tablet  . cefUROXime (CEFTIN) 500 MG tablet  . doxycycline (VIBRA-TABS) 100 MG tablet  . enoxaparin (LOVENOX) 120 MG/0.8ML injection  . ferrous sulfate 325 (65 FE) MG tablet  . gabapentin (NEURONTIN) 400 MG capsule  . glucose blood test strip  . hydrOXYzine (ATARAX/VISTARIL) 25 MG tablet  . insulin aspart (NOVOLOG) 100 UNIT/ML injection  . Insulin Glargine (BASAGLAR KWIKPEN) 100 UNIT/ML SOPN  . insulin glargine (LANTUS) 100 UNIT/ML injection  . insulin lispro (HUMALOG KWIKPEN) 100 UNIT/ML KwikPen  . Insulin Pen Needle 31G X 5 MM MISC  . Insulin Syringe-Needle U-100 (BD INSULIN SYRINGE ULTRAFINE) 31G X 5/16" 0.5 ML MISC  . megestrol (MEGACE) 40 MG tablet  . omeprazole (PRILOSEC) 20 MG capsule  . ondansetron (ZOFRAN) 4 MG tablet  . oxybutynin (DITROPAN) 5 MG tablet  . phenazopyridine (PYRIDIUM) 200 MG tablet  . polyethylene glycol (MIRALAX) packet  . sertraline (ZOLOFT) 50 MG tablet  . sodium polystyrene (KAYEXALATE) 15 GM/60ML suspension  . TRUEPLUS LANCETS 28G MISC  . warfarin (COUMADIN) 5 MG tablet   No current facility-administered medications for this encounter.     Konrad Felix, PA-C [facility] [phone]

## 2019-04-30 NOTE — H&P (Addendum)
Urology H+P Note     HPI: Terri Sparks is a 43 y.o. female with nephrolithiasis  Presented in Sept 2019 with severe urosepsis from 6 mm obstructing right UPJ stone and underwent right ureteral stent placement. Urine culture did not reveal predominant organism.   Unfortunately, she was lost to follow up and missed clinic appointment for stone treatment and stent removal. She returned on 12/21/18 with hematuria, and left flank / central lower back discomfort with a >6 month indwelling stent  CT 02/07/19:  - left kidney, she has 1 cm and 4 mm lower pole stones.   - right kidney, there is a > 1.5 cm stone in the upper pole which looks possibly intra-parenchymal as well as 1.4 cm and 5 mm lower pole stones.   Right stent exchange and left stent placement on 7/84/69 without complication  Has missed several appointments but needs definitive stone treatments. Preoperative coordination has been difficult give persistent positive urine cultures and coumadin use.   Coumadin plan (per note in epic) -   pt will need to begin holding Coumadin on 04/26/19 (day -5) and commence therapeutic BID Lovenox on 7/25 (day -3). Her last injection should be Monday morning (7/27) before her procedure on the 28th. INR on 7/27.   Antibiotics plan - Urine culture 04/19/19. E Coli. PCP tried to call in doxy. Has only reached voicemail. I have called patient as well with no answer.    Past Medical History: Past Medical History:  Diagnosis Date  . Anxiety   . Depression   . Diabetes ([place])    Type 2  . GERD (gastroesophageal reflux disease)   . History of blood clots    ,DVT-left leg, and early 2000, had blood clot behind left breast  . History of kidney stones   . Hypercalcemia   . Hyperparathyroidism   . Hypertension    had been on Lisinopril and PCP took her off it 4 months ago  . Iron deficiency anemia   . Left thyroid nodule   . Mass of right ovary   . Nephrolithiasis   . PAOD (peripheral arterial  occlusive disease) ([place])   . Peripheral vascular disease ([place])   . Prolonged Q-T interval on ECG 04/19/2019  . Renal calculi     Past Surgical History:  Past Surgical History:  Procedure Laterality Date  . ABDOMINAL AORTOGRAM W/LOWER EXTREMITY N/A 11/14/2017   Procedure: ABDOMINAL AORTOGRAM W/LOWER EXTREMITY;  Surgeon: Waynetta Sandy, MD;  Location: [place];  Service: Cardiovascular;  Laterality: N/A;  . ANGIOPLASTY ILLIAC ARTERY Left 02/25/2018   Procedure: BALLOON ANGIOPLASTY LEFT ILIAC ARTERY;  Surgeon: Conrad Bishop, MD;  Location: [place];  Service: Vascular;  Laterality: Left;  . APPLICATION OF WOUND VAC Left 03/03/2018   Procedure: APPLICATION OF WOUND VAC;  Surgeon: Angelia Mould, MD;  Location: [place];  Service: Vascular;  Laterality: Left;  . CYSTOSCOPY W/ URETERAL STENT PLACEMENT Right 06/27/2018   Procedure: CYSTOSCOPY WITH RETROGRADE PYELOGRAM/URETERAL DOUBLE J STENT PLACEMENT;  Surgeon: Ceasar Mons, MD;  Location: [place];  Service: Urology;  Laterality: Right;  . CYSTOSCOPY WITH STENT PLACEMENT N/A 02/23/2019   Procedure: CYSTOSCOPY WITH RIGHT URETERAL STENT EXCHANGE/ LEFT URETERAL STENT PLACEMENT;  Surgeon: Ceasar Mons, MD;  Location: [facility];  Service: Urology;  Laterality: N/A;  . EMBOLECTOMY Left 02/24/2017   Procedure: Left Lower Extremity Embolectomy and Angiogram.;  Surgeon: Waynetta Sandy, MD;  Location: [place];  Service: Vascular;  Laterality:  Left;  . INSERTION OF ILIAC STENT  02/24/2017   Procedure: INSERTION OF Common ILIAC STENT;  Surgeon: Waynetta Sandy, MD;  Location: [place];  Service: Vascular;;  . INTRAOPERATIVE ARTERIOGRAM Left 02/25/2018   Procedure: INTRA OPERATIVE ARTERIOGRAM WITH LEFT LEG RUNOFF;  Surgeon: Conrad Allen, MD;  Location: [place];  Service: Vascular;  Laterality: Left;  . LOWER EXTREMITY ANGIOGRAM Left 02/24/2017   Procedure: Aortagram, Left lower extremity Run-off;  Surgeon:  Waynetta Sandy, MD;  Location: [place];  Service: Vascular;  Laterality: Left;  . PATCH ANGIOPLASTY Left 02/25/2018   Procedure: PATCH ANGIOPLASTY LEFT SUPERFICIAL FEMORAL ARTERY WITH BOVINE PATCH;  Surgeon: Conrad [place] Lake, MD;  Location: [place];  Service: Vascular;  Laterality: Left;  . PERCUTANEOUS VENOUS THROMBECTOMY,LYSIS WITH INTRAVASCULAR ULTRASOUND (IVUS) Left 05/12/2018   Procedure: MECHANICAL THROMBECTOMY LEFT LEG, BALLOON ANGIOPLASTY LEFT ANTERIOR TIBIAL ARTERY, AORTOGRAM WITH LEFT LEG RUNOFF;  Surgeon: Serafina Mitchell, MD;  Location: [place];  Service: Vascular;  Laterality: Left;  . 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 THROMBECTOMY FEMORAL ARTERY Left 02/25/2018   Procedure: LEFT ILIAC AND POPLITEAL ARTERY THROMBECTOMY;  Surgeon: Conrad [place] Lake, MD;  Location: [place];  Service: Vascular;  Laterality: Left;  . TUBAL LIGATION    . WOUND DEBRIDEMENT Left 03/03/2018   Procedure: DEBRIDEMENT WOUND;  Surgeon: Angelia Mould, MD;  Location: [place];  Service: Vascular;  Laterality: Left;  . WOUND EXPLORATION Left 03/03/2018   Procedure: WOUND EXPLORATION;  Surgeon: Angelia Mould, MD;  Location: [facility];  Service: Vascular;  Laterality: Left;    Medication: No current facility-administered medications for this encounter.    Current Outpatient Medications  Medication Sig Dispense Refill  . albuterol (PROAIR HFA) 108 (90 Base) MCG/ACT inhaler Inhale 2 puffs into the lungs every 6 (six) hours as needed for wheezing or shortness of breath. 18 g 0  . amLODipine (NORVASC) 5 MG tablet Take 1 tablet (5 mg total) by mouth daily. 30 tablet 3  . aspirin EC 81 MG tablet Take 81 mg by mouth daily.    Marland Kitchen buPROPion (WELLBUTRIN SR) 150 MG 12 hr tablet Take 1 tablet (150 mg total) by mouth 2 (two) times daily. 60 tablet 0  . doxycycline (VIBRA-TABS) 100 MG tablet Take 1 tablet (100 mg total) by mouth 2 (two) times daily. 14 tablet 0  . enoxaparin (LOVENOX) 120 MG/0.8ML injection Inject 0.8  mLs (120 mg total) into the skin every 12 (twelve) hours. (Patient taking differently: Inject 120 mg into the skin every 12 (twelve) hours. Administer 120 mg evening of 02/20/2019. Administer 120 mg Lovenox morning of 02/21/2019 and 120 mg Lovenox evening of 02/21/2019. Administer 120 mg Thursday morning 02/22/2019 and NO LOVENOX Thursday evening. No Lovenox the day of the procedure. Per orders from Clinical Pharmacist from [place]) 20 Syringe 0  . ferrous sulfate 325 (65 FE) MG tablet Take 1 tablet (325 mg total) by mouth daily with breakfast. 100 tablet 1  . gabapentin (NEURONTIN) 400 MG capsule Take 2 capsules (800 mg total) by mouth 2 (two) times daily. (Patient taking differently: Take 400 mg by mouth 3 (three) times daily. ) 120 capsule 3  . hydrOXYzine (ATARAX/VISTARIL) 25 MG tablet TAKE 1 TABLET BY MOUTH ONCE IN THE MORNING, 1 TABLET AT NOON, AND 2 TABLETS IN THE EVENING AS NEEDED (Patient taking differently: Take 25 mg by mouth 3 (three) times daily. TAKE 1 TABLET BY MOUTH ONCE IN THE MORNING,  1 TABLET AT NOON, AND 2 TABLETS IN THE EVENING AS NEEDED) 90 tablet 0  . insulin aspart (NOVOLOG) 100 UNIT/ML injection Inject 5-15 Units into the skin 3 (three) times daily before meals.    . insulin glargine (LANTUS) 100 UNIT/ML injection Inject 73 Units into the skin at bedtime.    Marland Kitchen omeprazole (PRILOSEC) 20 MG capsule Take 2 capsules (40 mg total) by mouth daily. (Patient taking differently: Take 20 mg by mouth 2 (two) times daily before a meal. ) 60 capsule 0  . ondansetron (ZOFRAN) 4 MG tablet Take 1 tablet (4 mg total) by mouth daily as needed for nausea or vomiting. 30 tablet 1  . sertraline (ZOLOFT) 50 MG tablet Take 1 tablet (50 mg total) by mouth daily. 1/2 tab daily x 2 wks then 1 tab PO daily (Patient taking differently: Take 50 mg by mouth daily. ) 30 tablet 3  . warfarin (COUMADIN) 5 MG tablet TAKE 2 TABLETS ON MONDAY, WEDNESDAY AND FRIDAY WITH 1 TABLET ALL OTHER  DAYS. (Patient taking differently: Take 5-10 mg by mouth See admin instructions. Take 5 Monday, Wednesday and Friday and 10 mg on Tuesday, Thursday, Saturday and Sunday) 60 tablet 3  . blood glucose meter kit and supplies Dispense based on patient and insurance preference. Use up to four times daily as directed. (FOR ICD-9 250.00, 250.01). 1 each 0  . Blood Glucose Monitoring Suppl (TRUE METRIX METER) w/Device KIT Use as directed 1 kit 0  . cefUROXime (CEFTIN) 500 MG tablet Take 1 tablet (500 mg total) by mouth 2 (two) times daily with a meal. (Patient not taking: Reported on 04/24/2019) 14 tablet 0  . glucose blood test strip Use as instructed 100 each 12  . Insulin Glargine (BASAGLAR KWIKPEN) 100 UNIT/ML SOPN Inject 0.7 mLs (70 Units total) into the skin daily. (Patient not taking: Reported on 04/24/2019) 5 pen 6  . insulin lispro (HUMALOG KWIKPEN) 100 UNIT/ML KwikPen inj 5-15 units TID with meals subcut (Patient not taking: Reported on 04/24/2019) 15 mL 11  . Insulin Pen Needle 31G X 5 MM MISC Use with Lantus and Novolog injections. 100 each 11  . Insulin Syringe-Needle U-100 (BD INSULIN SYRINGE ULTRAFINE) 31G X 5/16" 0.5 ML MISC Use as directed 100 each 3  . megestrol (MEGACE) 40 MG tablet Take 2 tablets (80 mg total) by mouth daily. (Patient not taking: Reported on 04/24/2019) 180 tablet 3  . oxybutynin (DITROPAN) 5 MG tablet Take 1 tablet (5 mg total) by mouth every 8 (eight) hours as needed for bladder spasms. (Patient not taking: Reported on 04/24/2019) 30 tablet 1  . phenazopyridine (PYRIDIUM) 200 MG tablet Take 1 tablet (200 mg total) by mouth 3 (three) times daily as needed (for pain with urination). (Patient not taking: Reported on 04/24/2019) 30 tablet 0  . polyethylene glycol (MIRALAX) packet Take 17 g by mouth daily. (Patient not taking: Reported on 04/24/2019) 28 each 0  . sodium polystyrene (KAYEXALATE) 15 GM/60ML suspension Orally 30 gm daily dispense 1 month supply (Patient not taking:  Reported on 04/24/2019) 500 mL 0  . TRUEPLUS LANCETS 28G MISC Use as directed 100 each 6    Allergies: Allergies  Allergen Reactions  . Ciprofloxacin Hcl Hives  . Macrobid [Nitrofurantoin Macrocrystal] Hives, Shortness Of Breath and Rash  . Other Hives, Shortness Of Breath and Rash    NO "-CILLINS"!!!  . Penicillins Shortness Of Breath    Had had cephalosporins without incident Has patient had a PCN reaction causing immediate  rash, facial/tongue/throat swelling, SOB or lightheadedness with hypotension: Yes Has patient had a PCN reaction causing severe rash involving mucus membranes or skin necrosis: Unk Has patient had a PCN reaction that required hospitalization: Unk Has patient had a PCN reaction occurring within the last 10 years: No If all of the above answers are "NO", then may proceed with Cephalosporin use.   . Sulfa Antibiotics Hives, Shortness Of Breath and Rash  . Dilaudid [Hydromorphone Hcl] Other (See Comments)    Asystole per pt report  . Lexapro [Escitalopram Oxalate] Other (See Comments)    "I just did not like it."    Social History: Social History   Tobacco Use  . Smoking status: Current Some Day Smoker    Packs/day: 0.25    Years: 15.00    Pack years: 3.75    Types: Cigarettes  . Smokeless tobacco: Never Used  Substance Use Topics  . Alcohol use: Yes    Comment: occassionally  . Drug use: Yes    Types: Marijuana    Comment: once daily- last time used was night of 02/21/2019    Family History Family History  Problem Relation Age of Onset  . Depression Mother   . Diabetes Father   . Heart failure Father     Objective   Vital signs in last 24 hours: There were no vitals taken for this visit.  Physical Exam To be obtained in preop holding  Most Recent Labs: Lab Results  Component Value Date   WBC 10.3 02/22/2019   HGB 8.5 (L) 02/22/2019   HCT 29.2 (L) 02/22/2019   PLT 521 (H) 02/22/2019    Lab Results  Component Value Date   NA 135  02/22/2019   K 4.2 02/22/2019   CL 101 02/22/2019   CO2 25 02/22/2019   BUN 19 02/22/2019   CREATININE 2.16 (H) 02/22/2019   CALCIUM 10.0 02/22/2019   MG 1.5 (L) 06/28/2018    Lab Results  Component Value Date   INR 2.7 04/19/2019   APTT 28 02/25/2018     IMAGING: No results found.  ------  Assessment:  43 y.o. female with nephrolithiasis and bilateral stents   Plan: - Right USE, possible left USE on 7/28 - Hold coumadin prior with lovenox bridge - Confirm she is taking doxycycline

## 2019-05-01 ENCOUNTER — Ambulatory Visit (HOSPITAL_BASED_OUTPATIENT_CLINIC_OR_DEPARTMENT_OTHER): Payer: Medicaid Other | Admitting: Anesthesiology

## 2019-05-01 ENCOUNTER — Encounter (HOSPITAL_BASED_OUTPATIENT_CLINIC_OR_DEPARTMENT_OTHER)
Admission: RE | Disposition: A | Discharge status: Home / Self Care | Payer: Self-pay | Source: Home / Self Care | Attending: Urology

## 2019-05-01 ENCOUNTER — Encounter (HOSPITAL_BASED_OUTPATIENT_CLINIC_OR_DEPARTMENT_OTHER): Payer: Self-pay

## 2019-05-01 ENCOUNTER — Ambulatory Visit (HOSPITAL_BASED_OUTPATIENT_CLINIC_OR_DEPARTMENT_OTHER)
Admission: RE | Admit: 2019-05-01 | Discharge: 2019-05-01 | Disposition: A | Discharge status: Home / Self Care | Payer: Medicaid Other | Attending: Urology | Admitting: Urology

## 2019-05-01 DIAGNOSIS — E213 Hyperparathyroidism, unspecified: Secondary | ICD-10-CM | POA: Diagnosis not present

## 2019-05-01 DIAGNOSIS — Z79899 Other long term (current) drug therapy: Secondary | ICD-10-CM | POA: Diagnosis not present

## 2019-05-01 DIAGNOSIS — Z87442 Personal history of urinary calculi: Secondary | ICD-10-CM | POA: Insufficient documentation

## 2019-05-01 DIAGNOSIS — I4581 Long QT syndrome: Secondary | ICD-10-CM | POA: Diagnosis not present

## 2019-05-01 DIAGNOSIS — Z7951 Long term (current) use of inhaled steroids: Secondary | ICD-10-CM | POA: Diagnosis not present

## 2019-05-01 DIAGNOSIS — Z8249 Family history of ischemic heart disease and other diseases of the circulatory system: Secondary | ICD-10-CM | POA: Insufficient documentation

## 2019-05-01 DIAGNOSIS — Z888 Allergy status to other drugs, medicaments and biological substances status: Secondary | ICD-10-CM | POA: Diagnosis not present

## 2019-05-01 DIAGNOSIS — K219 Gastro-esophageal reflux disease without esophagitis: Secondary | ICD-10-CM | POA: Diagnosis not present

## 2019-05-01 DIAGNOSIS — D509 Iron deficiency anemia, unspecified: Secondary | ICD-10-CM | POA: Insufficient documentation

## 2019-05-01 DIAGNOSIS — F419 Anxiety disorder, unspecified: Secondary | ICD-10-CM | POA: Insufficient documentation

## 2019-05-01 DIAGNOSIS — Z7901 Long term (current) use of anticoagulants: Secondary | ICD-10-CM | POA: Diagnosis not present

## 2019-05-01 DIAGNOSIS — N200 Calculus of kidney: Secondary | ICD-10-CM | POA: Insufficient documentation

## 2019-05-01 DIAGNOSIS — I10 Essential (primary) hypertension: Secondary | ICD-10-CM | POA: Diagnosis not present

## 2019-05-01 DIAGNOSIS — I739 Peripheral vascular disease, unspecified: Secondary | ICD-10-CM | POA: Diagnosis not present

## 2019-05-01 DIAGNOSIS — Z881 Allergy status to other antibiotic agents status: Secondary | ICD-10-CM | POA: Diagnosis not present

## 2019-05-01 DIAGNOSIS — Z86718 Personal history of other venous thrombosis and embolism: Secondary | ICD-10-CM | POA: Diagnosis not present

## 2019-05-01 DIAGNOSIS — Z882 Allergy status to sulfonamides status: Secondary | ICD-10-CM | POA: Insufficient documentation

## 2019-05-01 DIAGNOSIS — F17210 Nicotine dependence, cigarettes, uncomplicated: Secondary | ICD-10-CM | POA: Insufficient documentation

## 2019-05-01 DIAGNOSIS — Z880 Allergy status to penicillin: Secondary | ICD-10-CM | POA: Insufficient documentation

## 2019-05-01 DIAGNOSIS — E119 Type 2 diabetes mellitus without complications: Secondary | ICD-10-CM | POA: Diagnosis not present

## 2019-05-01 DIAGNOSIS — F329 Major depressive disorder, single episode, unspecified: Secondary | ICD-10-CM | POA: Insufficient documentation

## 2019-05-01 DIAGNOSIS — Z833 Family history of diabetes mellitus: Secondary | ICD-10-CM | POA: Insufficient documentation

## 2019-05-01 DIAGNOSIS — Z818 Family history of other mental and behavioral disorders: Secondary | ICD-10-CM | POA: Insufficient documentation

## 2019-05-01 DIAGNOSIS — Z9109 Other allergy status, other than to drugs and biological substances: Secondary | ICD-10-CM | POA: Diagnosis not present

## 2019-05-01 HISTORY — PX: CYSTOSCOPY/RETROGRADE/URETEROSCOPY/STONE EXTRACTION WITH BASKET: SHX5317

## 2019-05-01 LAB — SARS CORONAVIRUS 2 (TAT 6-24 HRS): SARS Coronavirus 2: NEGATIVE

## 2019-05-01 LAB — GLUCOSE, CAPILLARY
Glucose-Capillary: 198 mg/dL — ABNORMAL HIGH (ref 70–99)
Glucose-Capillary: 209 mg/dL — ABNORMAL HIGH (ref 70–99)

## 2019-05-01 LAB — POCT PREGNANCY, URINE: Preg Test, Ur: NEGATIVE

## 2019-05-01 SURGERY — CYSTOSCOPY, WITH CALCULUS REMOVAL USING BASKET
Anesthesia: General | Site: Ureter | Laterality: Bilateral

## 2019-05-01 MED ORDER — OXYCODONE-ACETAMINOPHEN 5-325 MG PO TABS: 1.0000 | ORAL_TABLET | ORAL | 0 refills | Status: DC | PRN

## 2019-05-01 MED ORDER — PROPOFOL 10 MG/ML IV BOLUS
INTRAVENOUS | Status: DC | PRN
  Administered 2019-05-01: 200 mg via INTRAVENOUS

## 2019-05-01 MED ORDER — ONDANSETRON HCL 4 MG/2ML IJ SOLN
INTRAMUSCULAR | Status: AC
  Filled 2019-05-01: qty 2

## 2019-05-01 MED ORDER — FENTANYL CITRATE (PF) 100 MCG/2ML IJ SOLN
INTRAMUSCULAR | Status: AC
  Filled 2019-05-01: qty 2

## 2019-05-01 MED ORDER — KETOROLAC TROMETHAMINE 30 MG/ML IJ SOLN
INTRAMUSCULAR | Status: AC
  Filled 2019-05-01: qty 1

## 2019-05-01 MED ORDER — OXYCODONE HCL 5 MG PO TABS
5.0000 mg | ORAL_TABLET | Freq: Once | ORAL | Status: AC | PRN
  Administered 2019-05-01: 5 mg via ORAL
  Filled 2019-05-01: qty 1

## 2019-05-01 MED ORDER — OXYCODONE HCL 5 MG/5ML PO SOLN
5.0000 mg | Freq: Once | ORAL | Status: AC | PRN
  Filled 2019-05-01: qty 5

## 2019-05-01 MED ORDER — SODIUM CHLORIDE 0.9 % IR SOLN
Status: DC | PRN
  Administered 2019-05-01: 6000 mL

## 2019-05-01 MED ORDER — MIDAZOLAM HCL 2 MG/2ML IJ SOLN
INTRAMUSCULAR | Status: AC
  Filled 2019-05-01: qty 2

## 2019-05-01 MED ORDER — KETOROLAC TROMETHAMINE 30 MG/ML IJ SOLN
INTRAMUSCULAR | Status: DC | PRN
  Administered 2019-05-01: 30 mg via INTRAVENOUS

## 2019-05-01 MED ORDER — SCOPOLAMINE 1 MG/3DAYS TD PT72
1.0000 | MEDICATED_PATCH | TRANSDERMAL | Status: DC
  Administered 2019-05-01: 1.5 mg via TRANSDERMAL
  Filled 2019-05-01: qty 1

## 2019-05-01 MED ORDER — GENTAMICIN SULFATE 40 MG/ML IJ SOLN
2.5000 mg/kg | INTRAVENOUS | Status: AC
  Administered 2019-05-01: 310 mg via INTRAVENOUS
  Filled 2019-05-01 (×2): qty 7.75

## 2019-05-01 MED ORDER — OXYCODONE HCL 5 MG PO TABS
ORAL_TABLET | ORAL | Status: AC
  Filled 2019-05-01: qty 1

## 2019-05-01 MED ORDER — MIDAZOLAM HCL 2 MG/2ML IJ SOLN
INTRAMUSCULAR | Status: DC | PRN
  Administered 2019-05-01: 2 mg via INTRAVENOUS

## 2019-05-01 MED ORDER — WHITE PETROLATUM EX OINT
TOPICAL_OINTMENT | CUTANEOUS | Status: AC
  Filled 2019-05-01: qty 5

## 2019-05-01 MED ORDER — LIDOCAINE 2% (20 MG/ML) 5 ML SYRINGE
INTRAMUSCULAR | Status: AC
  Filled 2019-05-01: qty 5

## 2019-05-01 MED ORDER — FENTANYL CITRATE (PF) 100 MCG/2ML IJ SOLN
25.0000 ug | INTRAMUSCULAR | Status: DC | PRN
  Filled 2019-05-01: qty 1

## 2019-05-01 MED ORDER — LIDOCAINE 2% (20 MG/ML) 5 ML SYRINGE
INTRAMUSCULAR | Status: DC | PRN
  Administered 2019-05-01: 100 mg via INTRAVENOUS

## 2019-05-01 MED ORDER — PROMETHAZINE HCL 25 MG/ML IJ SOLN
6.2500 mg | INTRAMUSCULAR | Status: AC | PRN
  Administered 2019-05-01 (×2): 6.25 mg via INTRAVENOUS
  Filled 2019-05-01: qty 1

## 2019-05-01 MED ORDER — PROMETHAZINE HCL 25 MG/ML IJ SOLN
INTRAMUSCULAR | Status: AC
  Filled 2019-05-01: qty 1

## 2019-05-01 MED ORDER — FENTANYL CITRATE (PF) 100 MCG/2ML IJ SOLN
INTRAMUSCULAR | Status: DC | PRN
  Administered 2019-05-01 (×2): 50 ug via INTRAVENOUS
  Administered 2019-05-01: 25 ug via INTRAVENOUS
  Administered 2019-05-01: 50 ug via INTRAVENOUS
  Administered 2019-05-01: 25 ug via INTRAVENOUS

## 2019-05-01 MED ORDER — LACTATED RINGERS IV SOLN
INTRAVENOUS | Status: DC
  Administered 2019-05-01 (×2): via INTRAVENOUS
  Filled 2019-05-01: qty 1000

## 2019-05-01 MED ORDER — ONDANSETRON HCL 4 MG/2ML IJ SOLN
INTRAMUSCULAR | Status: DC | PRN
  Administered 2019-05-01: 4 mg via INTRAVENOUS

## 2019-05-01 MED ORDER — ONDANSETRON HCL 4 MG/2ML IJ SOLN
4.0000 mg | Freq: Four times a day (QID) | INTRAMUSCULAR | Status: AC | PRN
  Administered 2019-05-01: 4 mg via INTRAVENOUS
  Filled 2019-05-01: qty 2

## 2019-05-01 MED ORDER — SCOPOLAMINE 1 MG/3DAYS TD PT72
MEDICATED_PATCH | TRANSDERMAL | Status: AC
  Filled 2019-05-01: qty 1

## 2019-05-01 MED ORDER — IOHEXOL 300 MG/ML  SOLN
INTRAMUSCULAR | Status: DC | PRN
  Administered 2019-05-01: 20 mL via URETHRAL

## 2019-05-01 MED ORDER — PROPOFOL 10 MG/ML IV BOLUS
INTRAVENOUS | Status: AC
  Filled 2019-05-01: qty 40

## 2019-05-01 SURGICAL SUPPLY — 26 items
BAG DRAIN URO-CYSTO SKYTR STRL (DRAIN) ×2 IMPLANT
BASKET STONE 1.7 NGAGE (UROLOGICAL SUPPLIES) ×1 IMPLANT
BASKET ZERO TIP NITINOL 2.4FR (BASKET) ×1 IMPLANT
BENZOIN TINCTURE PRP APPL 2/3 (GAUZE/BANDAGES/DRESSINGS) IMPLANT
CATH URET 5FR 28IN OPEN ENDED (CATHETERS) ×1 IMPLANT
CATH URET DUAL LUMEN 6-10FR 50 (CATHETERS) ×1 IMPLANT
CLOTH BEACON ORANGE TIMEOUT ST (SAFETY) ×2 IMPLANT
FIBER LASER FLEXIVA 365 (UROLOGICAL SUPPLIES) IMPLANT
FIBER LASER TRAC TIP (UROLOGICAL SUPPLIES) ×1 IMPLANT
GLOVE BIO SURGEON STRL SZ7.5 (GLOVE) ×3 IMPLANT
GOWN STRL REUS W/TWL XL LVL3 (GOWN DISPOSABLE) ×2 IMPLANT
GUIDEWIRE STR DUAL SENSOR (WIRE) ×1 IMPLANT
GUIDEWIRE ZIPWRE .038 STRAIGHT (WIRE) ×2 IMPLANT
IV NS 1000ML (IV SOLUTION)
IV NS 1000ML BAXH (IV SOLUTION) IMPLANT
IV NS IRRIG 3000ML ARTHROMATIC (IV SOLUTION) ×3 IMPLANT
KIT TURNOVER CYSTO (KITS) ×2 IMPLANT
MANIFOLD NEPTUNE II (INSTRUMENTS) ×2 IMPLANT
NS IRRIG 500ML POUR BTL (IV SOLUTION) ×3 IMPLANT
PACK CYSTO (CUSTOM PROCEDURE TRAY) ×2 IMPLANT
SHEATH URETERAL 12FRX35CM (MISCELLANEOUS) ×1 IMPLANT
STENT URET 6FRX24 CONTOUR (STENTS) ×2 IMPLANT
STRIP CLOSURE SKIN 1/2X4 (GAUZE/BANDAGES/DRESSINGS) IMPLANT
SYR 10ML LL (SYRINGE) ×2 IMPLANT
TUBE CONNECTING 12X1/4 (SUCTIONS) ×1 IMPLANT
TUBING UROLOGY SET (TUBING) ×2 IMPLANT

## 2019-05-01 NOTE — Discharge Instructions (Signed)
Scopolamine skin patches What is this medicine? SCOPOLAMINE (skoe POL a meen) is used to prevent nausea and vomiting caused by motion sickness, anesthesia and surgery. This medicine may be used for other purposes; ask your health care provider or pharmacist if you have questions. COMMON BRAND NAME(S): Transderm Scop What should I tell my health care provider before I take this medicine? They need to know if you have any of these conditions:  are scheduled to have a gastric secretion test  glaucoma  heart disease  kidney disease  liver disease  lung or breathing disease, like asthma  mental illness  prostate disease  seizures  stomach or intestine problems  trouble passing urine  an unusual or allergic reaction to scopolamine, atropine, other medicines, foods, dyes, or preservatives  pregnant or trying to get pregnant  breast-feeding How should I use this medicine? This medicine is for external use only. Follow the directions on the prescription label. Wear only 1 patch at a time. Choose an area behind the ear, that is clean, dry, hairless and free from any cuts or irritation. Wipe the area with a clean dry tissue. Peel off the plastic backing of the skin patch, trying not to touch the adhesive side with your hands. Do not cut the patches. Firmly apply to the area you have chosen, with the metallic side of the patch to the skin and the tan-colored side showing. Once firmly in place, wash your hands well with soap and water. Do not get this medicine into your eyes. After removing the patch, wash your hands and the area behind your ear thoroughly with soap and water. The patch will still contain some medicine after use. To avoid accidental contact or ingestion by children or pets, fold the used patch in half with the sticky side together and throw away in the trash out of the reach of children and pets. If you need to use a second patch after you remove the first, place it behind  the other ear. A special MedGuide will be given to you by the pharmacist with each prescription and refill. Be sure to read this information carefully each time. Talk to your pediatrician regarding the use of this medicine in children. Special care may be needed. Overdosage: If you think you have taken too much of this medicine contact a poison control center or emergency room at once. NOTE: This medicine is only for you. Do not share this medicine with others. What if I miss a dose? This does not apply. This medicine is not for regular use. What may interact with this medicine?  alcohol  antihistamines for allergy cough and cold  atropine  certain medicines for anxiety or sleep  certain medicines for bladder problems like oxybutynin, tolterodine  certain medicines for depression like amitriptyline, fluoxetine, sertraline  certain medicines for stomach problems like dicyclomine, hyoscyamine  certain medicines for Parkinson's disease like benztropine, trihexyphenidyl  certain medicines for seizures like phenobarbital, primidone  general anesthetics like halothane, isoflurane, methoxyflurane, propofol  ipratropium  local anesthetics like lidocaine, pramoxine, tetracaine  medicines that relax muscles for surgery  phenothiazines like chlorpromazine, mesoridazine, prochlorperazine, thioridazine  narcotic medicines for pain  other belladonna alkaloids This list may not describe all possible interactions. Give your health care provider a list of all the medicines, herbs, non-prescription drugs, or dietary supplements you use. Also tell them if you smoke, drink alcohol, or use illegal drugs. Some items may interact with your medicine. What should I watch for while using  this medicine? Limit contact with water while swimming and bathing because the patch may fall off. If the patch falls off, throw it away and put a new one behind the other ear. You may get drowsy or dizzy. Do not  drive, use machinery, or do anything that needs mental alertness until you know how this medicine affects you. Do not stand or sit up quickly, especially if you are an older patient. This reduces the risk of dizzy or fainting spells. Alcohol may interfere with the effect of this medicine. Avoid alcoholic drinks. Your mouth may get dry. Chewing sugarless gum or sucking hard candy, and drinking plenty of water may help. Contact your healthcare professional if the problem does not go away or is severe. This medicine may cause dry eyes and blurred vision. If you wear contact lenses, you may feel some discomfort. Lubricating drops may help. See your healthcare professional if the problem does not go away or is severe. If you are going to need surgery, an MRI, CT scan, or other procedure, tell your healthcare professional that you are using this medicine. You may need to remove the patch before the procedure. What side effects may I notice from receiving this medicine? Side effects that you should report to your doctor or health care professional as soon as possible:  allergic reactions like skin rash, itching or hives; swelling of the face, lips, or tongue  blurred vision  changes in vision  confusion  dizziness  eye pain  fast, irregular heartbeat  hallucinations, loss of contact with reality  nausea, vomiting  pain or trouble passing urine  restlessness  seizures  skin irritation  stomach pain Side effects that usually do not require medical attention (report to your doctor or health care professional if they continue or are bothersome):  drowsiness  dry mouth  headache  sore throat This list may not describe all possible side effects. Call your doctor for medical advice about side effects. You may report side effects to FDA at 1-800-FDA-1088. Where should I keep my medicine? Keep out of the reach of children. Store at room temperature between 20 and 25 degrees C (68 and 77  degrees F). Keep this medicine in the foil package until ready to use. Throw away any unused medicine after the expiration date. NOTE: This sheet is a summary. It may not cover all possible information. If you have questions about this medicine, talk to your doctor, pharmacist, or health care provider.  2020 Elsevier/Gold Standard (2017-12-09 16:14:46) Urology Discharge Instructions:  BLOOD THINNER PLAN: - Start lovenox tonight, 05/01/19 PM - Start coumadin (Warfarin) on 05/02/19 AM - Follow up with pharmacist as scheduled to check INR and complete lovenox bridge  ANTIBIOTICS PLAN: - Continue taking doxycyline starting tonight, 05/01/19 PM until completed  POST PROCEDURE INSTRUCTIONS: 1. You may see some blood in the urine and may have some burning with urination for 48-72 hours. You also may notice that you have to urinate more frequently or urgently after your procedure which is normal.  2. You should call should you develop an inability urinate, fever > 101, persistent nausea and vomiting that prevents you from eating or drinking to stay hydrated.  3. If you have a stent, you will likely urinate more frequently and urgently until the stent is removed and you may experience some discomfort/pain in the lower abdomen and flank especially when urinating. You may take pain medication prescribed to you if needed for pain. You may also intermittently have blood  in the urine until the stent is removed. 4. Contact information you should call the office [phone]) to notify us of any issues. We CANNOT prescribe pain medicine over the phone  STENT PULL PLAN: - Remove stents on Monday 05/07/19 - Remove the stents (there are 2 strings) by pulling gently on the strings until the stents are removed. Ok to do one side, then the other.    FOLLOW UP: - Follow up will be in 6 weeks with renal ultrasound the same day   Post Anesthesia Home Care Instructions  Activity: Get plenty of rest for the  remainder of the day. A responsible individual must stay with you for 24 hours following the procedure.  For the next 24 hours, DO NOT: -Drive a car -Paediatric nurse -Drink alcoholic beverages -Take any medication unless instructed by your physician -Make any legal decisions or sign important papers.  Meals: Start with liquid foods such as gelatin or soup. Progress to regular foods as tolerated. Avoid greasy, spicy, heavy foods. If nausea and/or vomiting occur, drink only clear liquids until the nausea and/or vomiting subsides. Call your physician if vomiting continues.  Special Instructions/Symptoms: Your throat may feel dry or sore from the anesthesia or the breathing tube placed in your throat during surgery. If this causes discomfort, gargle with warm salt water. The discomfort should disappear within 24 hours.

## 2019-05-01 NOTE — Interval H&P Note (Signed)
History and Physical Interval Note:  05/01/2019 7:24 AM  Terri Sparks  has presented today for surgery, with the diagnosis of URETERAL AND RENAL STONE.  The various methods of treatment have been discussed with the patient and family. After consideration of risks, benefits and other options for treatment, the patient has consented to  Procedure(s): CYSTOSCOPY/URETEROSCOPY/STONE EXTRACTION / LASER LITHOTRIPSY, POSSIBLE LEFT URETEROSCOPY WITH URETERAL STONE EXTRACTION (N/A) as a surgical intervention.  The patient's history has been reviewed, patient examined, no change in status, stable for surgery.  I have reviewed the patient's chart and labs.  Questions were answered to the patient's satisfaction.     Tharon Aquas

## 2019-05-01 NOTE — Anesthesia Preprocedure Evaluation (Signed)
Anesthesia Evaluation  Patient identified by MRN, date of birth, ID band Patient awake    Reviewed: Allergy & Precautions, H&P , NPO status , Patient's Chart, lab work & pertinent test results  Airway Mallampati: II   Neck ROM: full    Dental   Pulmonary Current Smoker,    breath sounds clear to auscultation       Cardiovascular hypertension, + Peripheral Vascular Disease   Rhythm:regular Rate:Normal     Neuro/Psych PSYCHIATRIC DISORDERS Anxiety Depression  Neuromuscular disease    GI/Hepatic GERD  ,  Endo/Other  diabetes, Type 2, Insulin Dependent  Renal/GU Renal diseasestones     Musculoskeletal   Abdominal   Peds  Hematology   Anesthesia Other Findings   Reproductive/Obstetrics                             Anesthesia Physical Anesthesia Plan  ASA: III  Anesthesia Plan: General   Post-op Pain Management:    Induction: Intravenous  PONV Risk Score and Plan: 2 and Ondansetron, Midazolam, Treatment may vary due to age or medical condition and Scopolamine patch - Pre-op  Airway Management Planned: LMA  Additional Equipment:   Intra-op Plan:   Post-operative Plan:   Informed Consent: I have reviewed the patients History and Physical, chart, labs and discussed the procedure including the risks, benefits and alternatives for the proposed anesthesia with the patient or authorized representative who has indicated his/her understanding and acceptance.       Plan Discussed with: CRNA, Anesthesiologist and Surgeon  Anesthesia Plan Comments:         Anesthesia Quick Evaluation

## 2019-05-01 NOTE — Anesthesia Procedure Notes (Signed)
Procedure Name: LMA Insertion Date/Time: 05/01/2019 7:40 AM Performed by: Suan Halter, CRNA Pre-anesthesia Checklist: Patient identified, Emergency Drugs available, Suction available and Patient being monitored Patient Re-evaluated:Patient Re-evaluated prior to induction Oxygen Delivery Method: Circle system utilized Preoxygenation: Pre-oxygenation with 100% oxygen Induction Type: IV induction Ventilation: Mask ventilation without difficulty LMA: LMA inserted LMA Size: 4.0 Number of attempts: 1 Airway Equipment and Method: Bite block Placement Confirmation: positive ETCO2 Tube secured with: Tape Dental Injury: Teeth and Oropharynx as per pre-operative assessment

## 2019-05-01 NOTE — Op Note (Signed)
Preoperative diagnosis:  Bilateral calculus  Postoperative diagnosis: Bilateral calculus  Procedure:  1. Cystoscopy 2. Left ureteral stent removal 3. Left retrograde pyelogram with interpretation 4. Left ureteroscopic stone extraction with laser lithotripsy 5. Left ureteral stent placement 6. Right ureteral stent removal 7. Right retrograde pyelogram with interpretation 8. Right ureteroscopic stone extraction with laser lithotripsy 9. Right ureteral stent placement   Surgeon:  Ellison Hughs MD Tharon Aquas MD  Anesthesia:  General  Complications:  None  Intraoperative findings:  -Stent appeared not encrusted but unable to cannulate stent with Glidewire.  Stents removed and new wire was placed  -Left ureteroscopy showed 2 large stones 1 in the upper pole and one in the lower pole.  Both dusted with high frequency laser settings  -There was 1 stone in a small calyx that was inaccessible using the ureteroscope due to small lumen size.  Left in place given low chance that this would cause any type of obstructing symptoms   -Right ureteroscopy showed 1 adherent stone in the right upper pole.  Dusted using high-frequency laser settings   -In the right distal ureter there was a small submucosal flap.  Retrograde showed no extravasation.  -Appropriate position of bilateral stents on strings  EBL:  Minimal  Specimens: 1. Left renal calculus  Disposition of specimens:  [facility] for stone analysis  Indication:  Terri Sparks is a 43 y.o. female with nephrolithiasis  Presented in Sept 2019 with severe urosepsis from 6 mm obstructing right UPJ stone and underwent right ureteral stent placement. Urine culture did not reveal predominant organism.  Unfortunately, she was lost to follow up and missed clinic appointment for stone treatment and stent removal. She returned on 12/21/18 with hematuria, and left flank / central lower back discomfort with a  >6 month indwelling stent  CT 02/07/19:             - left kidney, she has 1 cm and 4 mm lower pole stones.              - right kidney, there is a > 1.5 cm stone in the upper pole which looks possibly intra-parenchymal as well as 1.4 cm and 5 mm lower pole stones.   Right stent exchange and left stent placement on 9/38/18 without complication  Has missed several appointments but needs definitive stone treatments. Preoperative coordination has been difficult give persistent positive urine cultures and coumadin use.  Coumadin plan (per note in epic) -   pt will need to begin holding Coumadin on 04/26/19 (day -5) and commence therapeutic BID Lovenox on 7/25 (day -3). Her last injection should be Monday morning (7/27) before her procedure on the 28th. INR on 7/27.  Antibiotics plan - Urine culture 04/19/19. E Coli. Switched to doxycyline preoperatively  Description of procedure:  The patient was taken to the operating room and general anesthesia was induced.  The patient was placed in the dorsal lithotomy position, prepped and draped in the usual sterile fashion, and preoperative antibiotics were administered. A preoperative time-out was performed.   Cystourethroscopy was performed.  The patient's urethra was examined and was normal. The bladder was then systematically examined in its entirety. There was no evidence for any bladder tumors, stones, or other mucosal pathology.    Attention then turned to the left ureteral orifice and stent was grasped and pulled to the urethral meatus.  Unable to cannulate stent with a wire so it was removed.   Cystourethroscopy was performed and using  an open ended ureteral catheter the left ureteral orifice was cannulated using a Glidewire.  An 53/10 French dual-lumen catheter was then advanced into the distal ureter and retrograde pyelogram was performed.  This showed no left hydronephrosis.  A second sensor wire was placed and the dual-lumen was removed.     A 10/45fr x 35 cm ureteral access sheath was then advanced under fluoroscopic guidance to the level of the UPJ.  Inner sheath and wire were removed and flexible ureteroscope was advanced into the left kidney.  Ureteroscopy showed significant debris as well as 2 large stones.  The lower pole stone was relocated to the upper pole and dusted using high-frequency laser settings (0.8J, 50 Hz).  The upper pole stone was also dusted .  There were no remaining sizable fragments.  1 small calyx was inaccessible due to caliber of the infundibulum and the stone inside the calyx was left alone  Pullout ureteroscopy demonstrated minimal ureteral irritation no sign of injury.  Wire was left in place.  A 6 x 24 cm JJ stent on a string was advanced over the wire under fluoroscopic guidance.  Cystoscope was inserted and we  turned to the right ureteral orifice and pulled the stent using flexible stent graspers.  The right ureteral orifice was cannulated using open-ended catheter and a Glidewire.  Flexible ureteroscopy was performed alongside the wire with a finding of a small mucosal flap in the distal ureter. Retrograde pyelogram was performed which showed no ureteral extravasation at the site  Ureteroscope was removed and then advanced over the wire with NO resistance into the proximal ureter safely.  Pan pyeloscopy was performed which showed copious debris in the collecting system as well as one adherent stone.  Stone was dusted using high-frequency laser settings, 0.8J and 50 Hz  Zip wire was then placed through the ureteroscope into the right renal collecting system.  Pullout ureteroscopy demonstrated no sign of injury and the distal part of the ureter with a submucosal flap was examined in detail.  No injury was seen.    A 6 x 24 cm JJ stent on a string was then advanced over the wire under fluoroscopic guidance into the right ureter.  Appropriate curl proximally and  distally.  Both strings visible at the end of  the procedure.    The bladder was then emptied and the procedure ended.  The patient appeared to tolerate the procedure well and without complications.  The patient was able to be awakened and transferred to the recovery unit in satisfactory condition.   Post Op Plan: - Resume lovenox tonight and coumadin tomorrow - Continue doxycyline - Patient to remove stent on Monday 8/3 - Follow up in 6 weeks with [facility]

## 2019-05-01 NOTE — Transfer of Care (Signed)
Immediate Anesthesia Transfer of Care Note  Patient: Terri Sparks  Procedure(s) Performed: Procedure(s) (LRB): CYSTOSCOPY/URETEROSCOPY/STONE EXTRACTION / LASER LITHOTRIPSY, BILATERAL URETEROSCOPY WITH URETERAL STONE EXTRACTION AND STENT EXCHANGE (Bilateral)  Patient Location: PACU  Anesthesia Type: General  Level of Consciousness: awake, oriented, sedated and patient cooperative  Airway & Oxygen Therapy: Patient Spontanous Breathing and Patient connected to face mask oxygen  Post-op Assessment: Report given to PACU RN and Post -op Vital signs reviewed and stable  Post vital signs: Reviewed and stable  Complications: No apparent anesthesia complications Last Vitals:  Vitals Value Taken Time  BP 142/92 05/01/19 0941  Temp 36.3 C 05/01/19 0941  Pulse 88 05/01/19 0944  Resp 21 05/01/19 0944  SpO2 95 % 05/01/19 0944  Vitals shown include unvalidated device data.  Last Pain:  Vitals:   05/01/19 0941  TempSrc:   PainSc: (P) Asleep      Patients Stated Pain Goal: 5 (05/01/19 0545)

## 2019-05-02 MED FILL — PROMETHAZINE 12.5 MG TABLET: 12.5 | 5 days supply | Qty: 30 | Fill #0

## 2019-05-02 NOTE — Anesthesia Postprocedure Evaluation (Signed)
Anesthesia Post Note  Patient: [name]  Procedure(s) Performed: CYSTOSCOPY/URETEROSCOPY/STONE EXTRACTION / LASER LITHOTRIPSY, BILATERAL URETEROSCOPY WITH URETERAL STONE EXTRACTION AND STENT EXCHANGE (Bilateral Ureter)     Patient location during evaluation: PACU Anesthesia Type: General Level of consciousness: awake and alert Pain management: pain level controlled Vital Signs Assessment: post-procedure vital signs reviewed and stable Respiratory status: spontaneous breathing, nonlabored ventilation, respiratory function stable and patient connected to nasal cannula oxygen Cardiovascular status: blood pressure returned to baseline and stable Postop Assessment: no apparent nausea or vomiting Anesthetic complications: no    Last Vitals:  Vitals:   05/01/19 1200 05/01/19 1300  BP: (!) 138/98 (!) 147/74  Pulse: (!) 125 (!) 128  Resp:  18  Temp:  36.7 C  SpO2: 99% 100%    Last Pain:  Vitals:   05/02/19 1205  TempSrc:   PainSc: 10-Worst pain ever                 Delois Silvester S

## 2019-05-02 NOTE — Progress Notes (Signed)
Pt states she has been in horrible pain today and vomiting as a result. There was an issue with her pain medication being sent to pharmacy. MD office is supposed to send new script to [place]. Instructed pt to call pharmacy and if med not called in call MD office back.

## 2019-05-03 ENCOUNTER — Encounter (HOSPITAL_BASED_OUTPATIENT_CLINIC_OR_DEPARTMENT_OTHER): Payer: Self-pay | Admitting: Urology

## 2019-05-08 MED FILL — WARFARIN SODIUM 5 MG TABLET: 5 | 42 days supply | Qty: 60 | Fill #0

## 2019-05-08 MED FILL — SERTRALINE HCL 50 MG TABS: 50 | 30 days supply | Qty: 30 | Fill #0

## 2019-05-08 MED FILL — hydrOXYzine HCL 25 MG TABS: 25 | 22 days supply | Qty: 90 | Fill #0

## 2019-05-09 ENCOUNTER — Ambulatory Visit: Payer: Self-pay | Attending: Family Medicine | Admitting: Pharmacist

## 2019-05-09 ENCOUNTER — Other Ambulatory Visit: Payer: Self-pay

## 2019-05-09 DIAGNOSIS — I745 Embolism and thrombosis of iliac artery: Secondary | ICD-10-CM

## 2019-05-09 DIAGNOSIS — I749 Embolism and thrombosis of unspecified artery: Secondary | ICD-10-CM

## 2019-05-09 LAB — POCT INR: INR: 1.6 — AB (ref 2.0–3.0)

## 2019-05-16 MED FILL — PROMETHAZINE 12.5 MG TABLET: 12.5 | 5 days supply | Qty: 30 | Fill #0

## 2019-05-23 ENCOUNTER — Encounter: Payer: Medicaid Other | Admitting: Pharmacist

## 2019-05-31 ENCOUNTER — Encounter: Payer: Medicaid Other | Admitting: Pharmacist

## 2019-06-11 ENCOUNTER — Emergency Department (HOSPITAL_COMMUNITY): Payer: Medicaid Other

## 2019-06-11 ENCOUNTER — Encounter (HOSPITAL_COMMUNITY)
Admission: EM | Disposition: A | Discharge status: Home / Self Care | Payer: Self-pay | Source: Home / Self Care | Attending: Internal Medicine

## 2019-06-11 ENCOUNTER — Encounter (HOSPITAL_COMMUNITY): Payer: Self-pay

## 2019-06-11 ENCOUNTER — Inpatient Hospital Stay (HOSPITAL_COMMUNITY): Payer: Medicaid Other

## 2019-06-11 ENCOUNTER — Inpatient Hospital Stay (HOSPITAL_COMMUNITY)
Admission: EM | Admit: 2019-06-11 | Discharge: 2019-06-16 | DRG: 854 | Disposition: A | Discharge status: Home / Self Care | Payer: Medicaid Other | Attending: Internal Medicine | Admitting: Internal Medicine

## 2019-06-11 ENCOUNTER — Inpatient Hospital Stay (HOSPITAL_COMMUNITY): Payer: Medicaid Other | Admitting: Anesthesiology

## 2019-06-11 ENCOUNTER — Other Ambulatory Visit: Payer: Self-pay

## 2019-06-11 DIAGNOSIS — R791 Abnormal coagulation profile: Secondary | ICD-10-CM

## 2019-06-11 DIAGNOSIS — Z880 Allergy status to penicillin: Secondary | ICD-10-CM | POA: Diagnosis not present

## 2019-06-11 DIAGNOSIS — N201 Calculus of ureter: Secondary | ICD-10-CM | POA: Diagnosis present

## 2019-06-11 DIAGNOSIS — D500 Iron deficiency anemia secondary to blood loss (chronic): Secondary | ICD-10-CM | POA: Diagnosis present

## 2019-06-11 DIAGNOSIS — E1165 Type 2 diabetes mellitus with hyperglycemia: Secondary | ICD-10-CM | POA: Diagnosis present

## 2019-06-11 DIAGNOSIS — Z6841 Body Mass Index (BMI) 40.0 and over, adult: Secondary | ICD-10-CM

## 2019-06-11 DIAGNOSIS — N136 Pyonephrosis: Secondary | ICD-10-CM | POA: Diagnosis present

## 2019-06-11 DIAGNOSIS — Z881 Allergy status to other antibiotic agents status: Secondary | ICD-10-CM | POA: Diagnosis not present

## 2019-06-11 DIAGNOSIS — N390 Urinary tract infection, site not specified: Secondary | ICD-10-CM

## 2019-06-11 DIAGNOSIS — Z87442 Personal history of urinary calculi: Secondary | ICD-10-CM

## 2019-06-11 DIAGNOSIS — Z86718 Personal history of other venous thrombosis and embolism: Secondary | ICD-10-CM

## 2019-06-11 DIAGNOSIS — N179 Acute kidney failure, unspecified: Secondary | ICD-10-CM

## 2019-06-11 DIAGNOSIS — F419 Anxiety disorder, unspecified: Secondary | ICD-10-CM | POA: Diagnosis present

## 2019-06-11 DIAGNOSIS — Z95828 Presence of other vascular implants and grafts: Secondary | ICD-10-CM

## 2019-06-11 DIAGNOSIS — F329 Major depressive disorder, single episode, unspecified: Secondary | ICD-10-CM | POA: Diagnosis present

## 2019-06-11 DIAGNOSIS — Z882 Allergy status to sulfonamides status: Secondary | ICD-10-CM

## 2019-06-11 DIAGNOSIS — I749 Embolism and thrombosis of unspecified artery: Secondary | ICD-10-CM | POA: Diagnosis present

## 2019-06-11 DIAGNOSIS — R112 Nausea with vomiting, unspecified: Secondary | ICD-10-CM

## 2019-06-11 DIAGNOSIS — I129 Hypertensive chronic kidney disease with stage 1 through stage 4 chronic kidney disease, or unspecified chronic kidney disease: Secondary | ICD-10-CM | POA: Diagnosis present

## 2019-06-11 DIAGNOSIS — F17210 Nicotine dependence, cigarettes, uncomplicated: Secondary | ICD-10-CM | POA: Diagnosis present

## 2019-06-11 DIAGNOSIS — Z7901 Long term (current) use of anticoagulants: Secondary | ICD-10-CM | POA: Diagnosis not present

## 2019-06-11 DIAGNOSIS — Z888 Allergy status to other drugs, medicaments and biological substances status: Secondary | ICD-10-CM | POA: Diagnosis not present

## 2019-06-11 DIAGNOSIS — F17200 Nicotine dependence, unspecified, uncomplicated: Secondary | ICD-10-CM

## 2019-06-11 DIAGNOSIS — Z833 Family history of diabetes mellitus: Secondary | ICD-10-CM | POA: Diagnosis not present

## 2019-06-11 DIAGNOSIS — Z7982 Long term (current) use of aspirin: Secondary | ICD-10-CM

## 2019-06-11 DIAGNOSIS — Z20828 Contact with and (suspected) exposure to other viral communicable diseases: Secondary | ICD-10-CM | POA: Diagnosis present

## 2019-06-11 DIAGNOSIS — I739 Peripheral vascular disease, unspecified: Secondary | ICD-10-CM | POA: Diagnosis present

## 2019-06-11 DIAGNOSIS — E875 Hyperkalemia: Secondary | ICD-10-CM | POA: Diagnosis not present

## 2019-06-11 DIAGNOSIS — Z794 Long term (current) use of insulin: Secondary | ICD-10-CM | POA: Diagnosis not present

## 2019-06-11 DIAGNOSIS — E1122 Type 2 diabetes mellitus with diabetic chronic kidney disease: Secondary | ICD-10-CM | POA: Diagnosis present

## 2019-06-11 DIAGNOSIS — Z8249 Family history of ischemic heart disease and other diseases of the circulatory system: Secondary | ICD-10-CM

## 2019-06-11 DIAGNOSIS — Z818 Family history of other mental and behavioral disorders: Secondary | ICD-10-CM | POA: Diagnosis not present

## 2019-06-11 DIAGNOSIS — E1151 Type 2 diabetes mellitus with diabetic peripheral angiopathy without gangrene: Secondary | ICD-10-CM | POA: Diagnosis present

## 2019-06-11 DIAGNOSIS — K219 Gastro-esophageal reflux disease without esophagitis: Secondary | ICD-10-CM | POA: Diagnosis present

## 2019-06-11 DIAGNOSIS — N184 Chronic kidney disease, stage 4 (severe): Secondary | ICD-10-CM | POA: Diagnosis present

## 2019-06-11 DIAGNOSIS — E1140 Type 2 diabetes mellitus with diabetic neuropathy, unspecified: Secondary | ICD-10-CM | POA: Diagnosis present

## 2019-06-11 DIAGNOSIS — R9431 Abnormal electrocardiogram [ECG] [EKG]: Secondary | ICD-10-CM

## 2019-06-11 DIAGNOSIS — E876 Hypokalemia: Secondary | ICD-10-CM | POA: Diagnosis present

## 2019-06-11 DIAGNOSIS — I82562 Chronic embolism and thrombosis of left calf muscular vein: Secondary | ICD-10-CM

## 2019-06-11 DIAGNOSIS — E538 Deficiency of other specified B group vitamins: Secondary | ICD-10-CM | POA: Diagnosis present

## 2019-06-11 DIAGNOSIS — E119 Type 2 diabetes mellitus without complications: Secondary | ICD-10-CM

## 2019-06-11 DIAGNOSIS — N132 Hydronephrosis with renal and ureteral calculous obstruction: Secondary | ICD-10-CM

## 2019-06-11 DIAGNOSIS — R000 Tachycardia, unspecified: Secondary | ICD-10-CM | POA: Diagnosis not present

## 2019-06-11 DIAGNOSIS — Z720 Tobacco use: Secondary | ICD-10-CM | POA: Diagnosis present

## 2019-06-11 DIAGNOSIS — E0865 Diabetes mellitus due to underlying condition with hyperglycemia: Secondary | ICD-10-CM

## 2019-06-11 DIAGNOSIS — A419 Sepsis, unspecified organism: Principal | ICD-10-CM | POA: Diagnosis present

## 2019-06-11 DIAGNOSIS — N10 Acute tubulo-interstitial nephritis: Secondary | ICD-10-CM | POA: Diagnosis present

## 2019-06-11 DIAGNOSIS — N1330 Unspecified hydronephrosis: Secondary | ICD-10-CM | POA: Diagnosis not present

## 2019-06-11 DIAGNOSIS — Z885 Allergy status to narcotic agent status: Secondary | ICD-10-CM | POA: Diagnosis not present

## 2019-06-11 DIAGNOSIS — K5900 Constipation, unspecified: Secondary | ICD-10-CM | POA: Diagnosis present

## 2019-06-11 DIAGNOSIS — I10 Essential (primary) hypertension: Secondary | ICD-10-CM | POA: Diagnosis present

## 2019-06-11 DIAGNOSIS — Z03818 Encounter for observation for suspected exposure to other biological agents ruled out: Secondary | ICD-10-CM | POA: Diagnosis not present

## 2019-06-11 HISTORY — PX: CYSTOSCOPY WITH STENT PLACEMENT: SHX5790

## 2019-06-11 LAB — URINALYSIS, ROUTINE W REFLEX MICROSCOPIC
Bilirubin Urine: NEGATIVE
Glucose, UA: 50 mg/dL — AB
Ketones, ur: NEGATIVE mg/dL
Nitrite: NEGATIVE
Protein, ur: 100 mg/dL — AB
RBC / HPF: >50 RBC/hpf — ABNORMAL HIGH (ref 0–5)
Specific Gravity, Urine: 1.012 (ref 1.005–1.030)
WBC, UA: >50 WBC/hpf — ABNORMAL HIGH (ref 0–5)
pH: 6 (ref 5.0–8.0)

## 2019-06-11 LAB — GLUCOSE, CAPILLARY
Glucose-Capillary: 108 mg/dL — ABNORMAL HIGH (ref 70–99)
Glucose-Capillary: 91 mg/dL (ref 70–99)

## 2019-06-11 LAB — PROTIME-INR
INR: 3.3 — ABNORMAL HIGH (ref 0.8–1.2)
Prothrombin Time: 33.1 seconds — ABNORMAL HIGH (ref 11.4–15.2)

## 2019-06-11 LAB — DIFFERENTIAL
Band Neutrophils: 0 %
Basophils Absolute: 0 10*3/uL (ref 0.0–0.1)
Basophils Relative: 0 %
Blasts: 0 %
Eosinophils Absolute: 0 10*3/uL (ref 0.0–0.5)
Eosinophils Relative: 0 %
Lymphocytes Relative: 6 %
Lymphs Abs: 1.3 10*3/uL (ref 0.7–4.0)
Metamyelocytes Relative: 0 %
Monocytes Absolute: 1.3 10*3/uL — ABNORMAL HIGH (ref 0.1–1.0)
Monocytes Relative: 6 %
Myelocytes: 0 %
Neutro Abs: 19.7 10*3/uL — ABNORMAL HIGH (ref 1.7–7.7)
Neutrophils Relative %: 88 %
Other: 0 %
Promyelocytes Relative: 0 %
nRBC: 0 /100 WBC

## 2019-06-11 LAB — CBC
HCT: 37.6 % (ref 36.0–46.0)
Hemoglobin: 12.1 g/dL (ref 12.0–15.0)
MCH: 28.6 pg (ref 26.0–34.0)
MCHC: 32.2 g/dL (ref 30.0–36.0)
MCV: 88.9 fL (ref 80.0–100.0)
Platelets: 369 10*3/uL (ref 150–400)
RBC: 4.23 MIL/uL (ref 3.87–5.11)
RDW: 16.8 % — ABNORMAL HIGH (ref 11.5–15.5)
WBC: 22.3 10*3/uL — ABNORMAL HIGH (ref 4.0–10.5)
nRBC: 0 % (ref 0.0–0.2)

## 2019-06-11 LAB — SARS CORONAVIRUS 2 BY RT PCR (HOSPITAL ORDER, PERFORMED IN ~~LOC~~ HOSPITAL LAB): SARS Coronavirus 2: NEGATIVE

## 2019-06-11 LAB — BASIC METABOLIC PANEL
Anion gap: 8 (ref 5–15)
BUN: 20 mg/dL (ref 6–20)
CO2: 20 mmol/L — ABNORMAL LOW (ref 22–32)
Calcium: 8.8 mg/dL — ABNORMAL LOW (ref 8.9–10.3)
Chloride: 106 mmol/L (ref 98–111)
Creatinine, Ser: 2.34 mg/dL — ABNORMAL HIGH (ref 0.44–1.00)
GFR calc Af Amer: 28 mL/min — ABNORMAL LOW (ref >60)
GFR calc non Af Amer: 24 mL/min — ABNORMAL LOW (ref >60)
Glucose, Bld: 191 mg/dL — ABNORMAL HIGH (ref 70–99)
Potassium: 3.4 mmol/L — ABNORMAL LOW (ref 3.5–5.1)
Sodium: 134 mmol/L — ABNORMAL LOW (ref 135–145)

## 2019-06-11 LAB — I-STAT BETA HCG BLOOD, ED (MC, WL, AP ONLY): I-stat hCG, quantitative: <5 m[IU]/mL (ref <5)

## 2019-06-11 LAB — MAGNESIUM: Magnesium: 1.7 mg/dL (ref 1.7–2.4)

## 2019-06-11 SURGERY — CYSTOSCOPY, WITH STENT INSERTION
Anesthesia: General | Site: Ureter | Laterality: Right

## 2019-06-11 MED ORDER — FENTANYL CITRATE (PF) 250 MCG/5ML IJ SOLN
INTRAMUSCULAR | Status: DC | PRN
  Administered 2019-06-11: 100 ug via INTRAVENOUS

## 2019-06-11 MED ORDER — SODIUM CHLORIDE 0.9 % IV SOLN: 1.0000 g | INTRAVENOUS | Status: DC

## 2019-06-11 MED ORDER — SUGAMMADEX SODIUM 200 MG/2ML IV SOLN
INTRAVENOUS | Status: DC | PRN
  Administered 2019-06-11: 200 mg via INTRAVENOUS

## 2019-06-11 MED ORDER — PROPOFOL 10 MG/ML IV BOLUS
INTRAVENOUS | Status: DC | PRN
  Administered 2019-06-11: 200 mg via INTRAVENOUS

## 2019-06-11 MED ORDER — MIDAZOLAM HCL 2 MG/2ML IJ SOLN
INTRAMUSCULAR | Status: DC | PRN
  Administered 2019-06-11: 2 mg via INTRAVENOUS

## 2019-06-11 MED ORDER — LIDOCAINE 2% (20 MG/ML) 5 ML SYRINGE
INTRAMUSCULAR | Status: AC
  Filled 2019-06-11: qty 5

## 2019-06-11 MED ORDER — MIDAZOLAM HCL 2 MG/2ML IJ SOLN: 0.5000 mg | Freq: Once | INTRAMUSCULAR | Status: DC | PRN

## 2019-06-11 MED ORDER — SCOPOLAMINE 1 MG/3DAYS TD PT72
MEDICATED_PATCH | TRANSDERMAL | Status: AC
  Filled 2019-06-11: qty 1

## 2019-06-11 MED ORDER — DEXAMETHASONE SODIUM PHOSPHATE 10 MG/ML IJ SOLN
INTRAMUSCULAR | Status: AC
  Filled 2019-06-11: qty 1

## 2019-06-11 MED ORDER — PHENYLEPHRINE 40 MCG/ML (10ML) SYRINGE FOR IV PUSH (FOR BLOOD PRESSURE SUPPORT)
PREFILLED_SYRINGE | INTRAVENOUS | Status: DC | PRN
  Administered 2019-06-11 (×2): 40 ug via INTRAVENOUS

## 2019-06-11 MED ORDER — LACTATED RINGERS IV SOLN
INTRAVENOUS | Status: DC | PRN
  Administered 2019-06-11: 18:00:00 via INTRAVENOUS

## 2019-06-11 MED ORDER — FLEET ENEMA 7-19 GM/118ML RE ENEM: 1.0000 | ENEMA | Freq: Once | RECTAL | Status: DC | PRN

## 2019-06-11 MED ORDER — SODIUM CHLORIDE 0.9 % IV SOLN
1.0000 g | Freq: Once | INTRAVENOUS | Status: AC
  Administered 2019-06-11: 1 g via INTRAVENOUS
  Filled 2019-06-11: qty 10

## 2019-06-11 MED ORDER — MENTHOL 3 MG MT LOZG
1.0000 | LOZENGE | OROMUCOSAL | Status: DC | PRN
  Administered 2019-06-11: 3 mg via ORAL
  Filled 2019-06-11 (×3): qty 9

## 2019-06-11 MED ORDER — LORAZEPAM 2 MG/ML IJ SOLN
1.0000 mg | Freq: Once | INTRAMUSCULAR | Status: AC
  Administered 2019-06-11: 1 mg via INTRAVENOUS
  Filled 2019-06-11: qty 1

## 2019-06-11 MED ORDER — DEXTROSE 5 % IV SOLN
INTRAVENOUS | Status: DC | PRN
  Administered 2019-06-11: 1 g via INTRAVENOUS

## 2019-06-11 MED ORDER — ONDANSETRON HCL 4 MG/2ML IJ SOLN
INTRAMUSCULAR | Status: DC | PRN
  Administered 2019-06-11: 4 mg via INTRAVENOUS

## 2019-06-11 MED ORDER — PROMETHAZINE HCL 25 MG/ML IJ SOLN
12.5000 mg | Freq: Once | INTRAMUSCULAR | Status: AC
  Administered 2019-06-11: 12.5 mg via INTRAVENOUS
  Filled 2019-06-11: qty 1

## 2019-06-11 MED ORDER — FENTANYL CITRATE (PF) 100 MCG/2ML IJ SOLN: 25.0000 ug | INTRAMUSCULAR | Status: DC | PRN

## 2019-06-11 MED ORDER — MORPHINE SULFATE (PF) 4 MG/ML IV SOLN
4.0000 mg | Freq: Once | INTRAVENOUS | Status: AC
  Administered 2019-06-11: 4 mg via INTRAVENOUS
  Filled 2019-06-11: qty 1

## 2019-06-11 MED ORDER — DOCUSATE SODIUM 100 MG PO CAPS
100.0000 mg | ORAL_CAPSULE | Freq: Two times a day (BID) | ORAL | Status: DC
  Administered 2019-06-11: 100 mg via ORAL
  Filled 2019-06-11: qty 1

## 2019-06-11 MED ORDER — BISACODYL 10 MG RE SUPP: 10.0000 mg | Freq: Every day | RECTAL | Status: DC | PRN

## 2019-06-11 MED ORDER — MIDAZOLAM HCL 2 MG/2ML IJ SOLN
INTRAMUSCULAR | Status: AC
  Filled 2019-06-11: qty 2

## 2019-06-11 MED ORDER — INSULIN ASPART 100 UNIT/ML ~~LOC~~ SOLN
0.0000 [IU] | Freq: Three times a day (TID) | SUBCUTANEOUS | Status: DC
  Administered 2019-06-12: 2 [IU] via SUBCUTANEOUS
  Administered 2019-06-12: 1 [IU] via SUBCUTANEOUS
  Administered 2019-06-13 – 2019-06-15 (×5): 2 [IU] via SUBCUTANEOUS
  Administered 2019-06-15: 3 [IU] via SUBCUTANEOUS
  Administered 2019-06-15: 2 [IU] via SUBCUTANEOUS
  Administered 2019-06-16: 5 [IU] via SUBCUTANEOUS

## 2019-06-11 MED ORDER — ONDANSETRON HCL 4 MG/2ML IJ SOLN
INTRAMUSCULAR | Status: AC
  Filled 2019-06-11: qty 2

## 2019-06-11 MED ORDER — INSULIN GLARGINE 100 UNIT/ML ~~LOC~~ SOLN
30.0000 [IU] | Freq: Every day | SUBCUTANEOUS | Status: DC
  Administered 2019-06-11 – 2019-06-16 (×5): 30 [IU] via SUBCUTANEOUS
  Filled 2019-06-11 (×7): qty 0.3

## 2019-06-11 MED ORDER — MAGNESIUM SULFATE 2 GM/50ML IV SOLN: 2.0000 g | Freq: Once | INTRAVENOUS | Status: DC

## 2019-06-11 MED ORDER — SODIUM CHLORIDE 0.9 % IR SOLN
Status: DC | PRN
  Administered 2019-06-11: 3000 mL

## 2019-06-11 MED ORDER — SCOPOLAMINE 1 MG/3DAYS TD PT72
MEDICATED_PATCH | TRANSDERMAL | Status: DC | PRN
  Administered 2019-06-11: 1 via TRANSDERMAL

## 2019-06-11 MED ORDER — INSULIN ASPART 100 UNIT/ML ~~LOC~~ SOLN: 0.0000 [IU] | Freq: Every day | SUBCUTANEOUS | Status: DC

## 2019-06-11 MED ORDER — POTASSIUM CHLORIDE IN NACL 40-0.9 MEQ/L-% IV SOLN
INTRAVENOUS | Status: DC
  Administered 2019-06-11 – 2019-06-12 (×2): 100 mL/h via INTRAVENOUS
  Filled 2019-06-11 (×2): qty 1000

## 2019-06-11 MED ORDER — NICOTINE 14 MG/24HR TD PT24
14.0000 mg | MEDICATED_PATCH | Freq: Every day | TRANSDERMAL | Status: DC
  Administered 2019-06-12 – 2019-06-15 (×4): 14 mg via TRANSDERMAL
  Filled 2019-06-11 (×5): qty 1

## 2019-06-11 MED ORDER — LORATADINE 10 MG PO TABS
10.0000 mg | ORAL_TABLET | Freq: Every day | ORAL | Status: DC | PRN
  Administered 2019-06-11: 10 mg via ORAL
  Filled 2019-06-11: qty 1

## 2019-06-11 MED ORDER — HEPARIN SODIUM (PORCINE) 5000 UNIT/ML IJ SOLN: 5000.0000 [IU] | Freq: Three times a day (TID) | INTRAMUSCULAR | Status: DC

## 2019-06-11 MED ORDER — MORPHINE SULFATE (PF) 4 MG/ML IV SOLN
3.0000 mg | INTRAVENOUS | Status: DC | PRN
  Administered 2019-06-11 – 2019-06-15 (×15): 3 mg via INTRAVENOUS
  Filled 2019-06-11 (×16): qty 1

## 2019-06-11 MED ORDER — ROCURONIUM BROMIDE 10 MG/ML (PF) SYRINGE
PREFILLED_SYRINGE | INTRAVENOUS | Status: DC | PRN
  Administered 2019-06-11: 30 mg via INTRAVENOUS

## 2019-06-11 MED ORDER — MEPERIDINE HCL 50 MG/ML IJ SOLN: 6.2500 mg | INTRAMUSCULAR | Status: DC | PRN

## 2019-06-11 MED ORDER — IOHEXOL 300 MG/ML  SOLN
INTRAMUSCULAR | Status: DC | PRN
  Administered 2019-06-11: 3 mL via URETHRAL

## 2019-06-11 MED ORDER — SUCCINYLCHOLINE CHLORIDE 200 MG/10ML IV SOSY
PREFILLED_SYRINGE | INTRAVENOUS | Status: DC | PRN
  Administered 2019-06-11: 120 mg via INTRAVENOUS

## 2019-06-11 MED ORDER — SALINE SPRAY 0.65 % NA SOLN
1.0000 | NASAL | Status: DC | PRN
  Administered 2019-06-11: 1 via NASAL
  Filled 2019-06-11 (×2): qty 44

## 2019-06-11 MED ORDER — SENNOSIDES-DOCUSATE SODIUM 8.6-50 MG PO TABS
1.0000 | ORAL_TABLET | Freq: Every evening | ORAL | Status: DC | PRN
  Administered 2019-06-12: 1 via ORAL
  Filled 2019-06-11: qty 1

## 2019-06-11 MED ORDER — SODIUM CHLORIDE 0.9 % IV SOLN
INTRAVENOUS | Status: AC
  Filled 2019-06-11: qty 20

## 2019-06-11 MED ORDER — LIDOCAINE 2% (20 MG/ML) 5 ML SYRINGE
INTRAMUSCULAR | Status: DC | PRN
  Administered 2019-06-11: 40 mg via INTRAVENOUS

## 2019-06-11 MED ORDER — FENTANYL CITRATE (PF) 100 MCG/2ML IJ SOLN
INTRAMUSCULAR | Status: AC
  Filled 2019-06-11: qty 2

## 2019-06-11 MED ORDER — PROPOFOL 10 MG/ML IV BOLUS
INTRAVENOUS | Status: AC
  Filled 2019-06-11: qty 40

## 2019-06-11 SURGICAL SUPPLY — 16 items
BAG URINE DRAINAGE (UROLOGICAL SUPPLIES) ×2 IMPLANT
BAG URO CATCHER STRL LF (MISCELLANEOUS) ×2 IMPLANT
CATH FOLEY 2WAY SLVR  5CC 16FR (CATHETERS) ×1
CATH FOLEY 2WAY SLVR 5CC 16FR (CATHETERS) ×1 IMPLANT
CATH URET 5FR 28IN OPEN ENDED (CATHETERS) IMPLANT
CLOTH BEACON ORANGE TIMEOUT ST (SAFETY) ×2 IMPLANT
COVER WAND RF STERILE (DRAPES) IMPLANT
GLOVE SURG SS PI 8.0 STRL IVOR (GLOVE) ×2 IMPLANT
GOWN STRL REUS W/TWL XL LVL3 (GOWN DISPOSABLE) ×2 IMPLANT
GUIDEWIRE STR DUAL SENSOR (WIRE) ×2 IMPLANT
KIT TURNOVER KIT A (KITS) ×2 IMPLANT
MANIFOLD NEPTUNE II (INSTRUMENTS) ×2 IMPLANT
PACK CYSTO (CUSTOM PROCEDURE TRAY) ×2 IMPLANT
STENT URET 6FRX24 CONTOUR (STENTS) ×2 IMPLANT
TUBING CONNECTING 10 (TUBING) ×2 IMPLANT
TUBING UROLOGY SET (TUBING) IMPLANT

## 2019-06-11 NOTE — ED Notes (Signed)
Report Terri Sparks, Therapist, sports.

## 2019-06-11 NOTE — Discharge Instructions (Signed)

## 2019-06-11 NOTE — Consult Note (Signed)
Subjective: CC: Right flank pain.  Hx: Terri Sparks is a 43 yo female who I was asked to see in consultation by Dr. Ashok Cordia for a right 68m mid to distal  ureteral stone with obstruction and signs of sepsis with a low grade fever, leukocytosis and AKI.   She has a 3 day history of right flank pain that is severe and associated with N/V.   She has a history of recurrent urolithiasis with bilateral stones and prior bilateral  ureteroscopy and stent on 05/01/19 by Dr. WLovena Neighbours  She is on chronic warfarin for DVT's and PVD.  Her  UA is nitrite positive with pyuria and hematuria and there is some air in the collecting systems bilaterally which could be residual from her recent procedure or related to the infection. She has no other associated signs or symptoms.   ROS:  Review of Systems  Constitutional: Positive for fever.  Respiratory: Positive for shortness of breath (when she has flank pain).   Cardiovascular: Positive for chest pain (when she has flank pain.).  Gastrointestinal: Positive for nausea and vomiting.  All other systems reviewed and are negative.   Allergies  Allergen Reactions  . Ciprofloxacin Hcl Hives  . Macrobid [Nitrofurantoin Macrocrystal] Hives, Shortness Of Breath and Rash  . Other Hives, Shortness Of Breath and Rash    NO "-CILLINS"!!!  . Penicillins Shortness Of Breath    Had had cephalosporins without incident Has patient had a PCN reaction causing immediate rash, facial/tongue/throat swelling, SOB or lightheadedness with hypotension: Yes Has patient had a PCN reaction causing severe rash involving mucus membranes or skin necrosis: Unk Has patient had a PCN reaction that required hospitalization: Unk Has patient had a PCN reaction occurring within the last 10 years: No If all of the above answers are "NO", then may proceed with Cephalosporin use.   . Sulfa Antibiotics Hives, Shortness Of Breath and Rash  . Dilaudid [Hydromorphone Hcl] Other (See Comments)    Asystole per  pt report  . Lexapro [Escitalopram Oxalate] Other (See Comments)    "I just did not like it."    Past Medical History:  Diagnosis Date  . Anxiety   . Depression   . Diabetes ([place]    Type 2  . GERD (gastroesophageal reflux disease)   . History of blood clots    ,DVT-left leg, and early 2000, had blood clot behind left breast  . History of kidney stones   . Hypercalcemia   . Hyperparathyroidism   . Hypertension    had been on Lisinopril and PCP took her off it 4 months ago  . Iron deficiency anemia   . Left thyroid nodule   . Mass of right ovary   . Nephrolithiasis   . PAOD (peripheral arterial occlusive disease) ([place]   . Peripheral vascular disease [place]   . Prolonged Q-T interval on ECG 04/19/2019  . Renal calculi   . Tooth loose    top tooth from the right  is loose     Past Surgical History:  Procedure Laterality Date  . ABDOMINAL AORTOGRAM W/LOWER EXTREMITY N/A 11/14/2017   Procedure: ABDOMINAL AORTOGRAM W/LOWER EXTREMITY;  Surgeon: CWaynetta Sandy MD;  [facility];  Service: Cardiovascular;  Laterality: N/A;  . ANGIOPLASTY ILLIAC ARTERY Left 02/25/2018   Procedure: BALLOON ANGIOPLASTY LEFT ILIAC ARTERY;  Surgeon: CConrad Berlin MD;  [facility]  Service: Vascular;  Laterality: Left;  . APPLICATION OF WOUND VAC Left 03/03/2018   Procedure: APPLICATION OF  WOUND VAC;  Surgeon: Angelia Mould, MD;  Location: [place];  Service: Vascular;  Laterality: Left;  . CYSTOSCOPY W/ URETERAL STENT PLACEMENT Right 06/27/2018   Procedure: CYSTOSCOPY WITH RETROGRADE PYELOGRAM/URETERAL DOUBLE J STENT PLACEMENT;  Surgeon: Ceasar Mons, MD;  Location: [place];  Service: Urology;  Laterality: Right;  . CYSTOSCOPY WITH STENT PLACEMENT N/A 02/23/2019   Procedure: CYSTOSCOPY WITH RIGHT URETERAL STENT EXCHANGE/ LEFT URETERAL STENT PLACEMENT;  Surgeon: Ceasar Mons, MD;  Location: [facility];  Service: Urology;  Laterality: N/A;  .  CYSTOSCOPY/RETROGRADE/URETEROSCOPY/STONE EXTRACTION WITH BASKET Bilateral 05/01/2019   Procedure: CYSTOSCOPY/URETEROSCOPY/STONE EXTRACTION / LASER LITHOTRIPSY, BILATERAL URETEROSCOPY WITH URETERAL STONE EXTRACTION AND STENT EXCHANGE;  Surgeon: Ceasar Mons, MD;  Location[facility];  Service: Urology;  Laterality: Bilateral;  . EMBOLECTOMY Left 02/24/2017   Procedure: Left Lower Extremity Embolectomy and Angiogram.;  Surgeon: Waynetta Sandy, MD;  Location: [place];  Service: Vascular;  Laterality: Left;  . INSERTION OF ILIAC STENT  02/24/2017   Procedure: INSERTION OF Common ILIAC STENT;  Surgeon: Waynetta Sandy, MD;  Location: [place];  Service: Vascular;;  . INTRAOPERATIVE ARTERIOGRAM Left 02/25/2018   Procedure: INTRA OPERATIVE ARTERIOGRAM WITH LEFT LEG RUNOFF;  Surgeon: Conrad Secor, MD;  Location: [place];  Service: Vascular;  Laterality: Left;  . LOWER EXTREMITY ANGIOGRAM Left 02/24/2017   Procedure: Aortagram, Left lower extremity Run-off;  Surgeon: Waynetta Sandy, MD;  Location: [place];  Service: Vascular;  Laterality: Left;  . PATCH ANGIOPLASTY Left 02/25/2018   Procedure: PATCH ANGIOPLASTY LEFT SUPERFICIAL FEMORAL ARTERY WITH BOVINE PATCH;  Surgeon: Conrad [place], MD;  Location: [place];  Service: Vascular;  Laterality: Left;  . PERCUTANEOUS VENOUS THROMBECTOMY,LYSIS WITH INTRAVASCULAR ULTRASOUND (IVUS) Left 05/12/2018   Procedure: MECHANICAL THROMBECTOMY LEFT LEG, BALLOON ANGIOPLASTY LEFT ANTERIOR TIBIAL ARTERY, AORTOGRAM WITH LEFT LEG RUNOFF;  Surgeon: Serafina Mitchell, MD;  Location: [place];  Service: Vascular;  Laterality: Left;  . 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 THROMBECTOMY FEMORAL ARTERY Left 02/25/2018   Procedure: LEFT ILIAC AND POPLITEAL ARTERY THROMBECTOMY;  Surgeon: Conrad , MD;  Location: [place];  Service: Vascular;  Laterality: Left;  . TUBAL LIGATION    . WOUND DEBRIDEMENT Left 03/03/2018   Procedure: DEBRIDEMENT  WOUND;  Surgeon: Angelia Mould, MD;  Location: [place];  Service: Vascular;  Laterality: Left;  . WOUND EXPLORATION Left 03/03/2018   Procedure: WOUND EXPLORATION;  Surgeon: Angelia Mould, MD;  [facility];  Service: Vascular;  Laterality: Left;    Social History   Socioeconomic History  . Marital status: Single    Spouse name: Not on file  . Number of children: Not on file  . Years of education: Not on file  . Highest education level: Not on file  Occupational History  . Not on file  Social Needs  . Financial resource strain: Not on file  . Food insecurity    Worry: Not on file    Inability: Not on file  . Transportation needs    Medical: Not on file    Non-medical: Not on file  Tobacco Use  . Smoking status: Current Some Day Smoker    Packs/day: 0.25    Years: 15.00    Pack years: 3.75    Types: Cigarettes  . Smokeless tobacco: Never Used  Substance and Sexual Activity  . Alcohol use: Yes    Comment: occassionally  . Drug use: Yes    Types:  Marijuana    Comment: once daily- last time used was night of 02/21/2019  . Sexual activity: Yes    Partners: Male    Birth control/protection: Other-see comments    Comment: BTL  Lifestyle  . Physical activity    Days per week: Not on file    Minutes per session: Not on file  . Stress: Not on file  Relationships  . Social Herbalist on phone: Not on file    Gets together: Not on file    Attends religious service: Not on file    Active member of club or organization: Not on file    Attends meetings of clubs or organizations: Not on file    Relationship status: Not on file  . Intimate partner violence    Fear of current or ex partner: Not on file    Emotionally abused: Not on file    Physically abused: Not on file    Forced sexual activity: Not on file  Other Topics Concern  . Not on file  Social History Narrative  . Not on file    Family History  Problem Relation Age of Onset  .  Depression Mother   . Diabetes Father   . Heart failure Father     Anti-infectives: Anti-infectives (From admission, onward)   Start     Dose/Rate Route Frequency Ordered Stop   06/11/19 1500  cefTRIAXone (ROCEPHIN) 1 g in sodium chloride 0.9 % 100 mL IVPB     1 g 200 mL/hr over 30 Minutes Intravenous  Once 06/11/19 1448        Current Facility-Administered Medications  Medication Dose Route Frequency Provider Last Rate Last Dose  . cefTRIAXone (ROCEPHIN) 1 g in sodium chloride 0.9 % 100 mL IVPB  1 g Intravenous Once Margarita Mail, PA-C       Current Outpatient Medications  Medication Sig Dispense Refill  . albuterol (PROAIR HFA) 108 (90 Base) MCG/ACT inhaler Inhale 2 puffs into the lungs every 6 (six) hours as needed for wheezing or shortness of breath. 18 g 0  . amLODipine (NORVASC) 5 MG tablet Take 1 tablet (5 mg total) by mouth daily. 30 tablet 3  . aspirin EC 81 MG tablet Take 81 mg by mouth daily.    Marland Kitchen buPROPion (WELLBUTRIN SR) 150 MG 12 hr tablet Take 1 tablet (150 mg total) by mouth 2 (two) times daily. 60 tablet 0  . ferrous sulfate 325 (65 FE) MG tablet Take 1 tablet (325 mg total) by mouth daily with breakfast. 100 tablet 1  . gabapentin (NEURONTIN) 400 MG capsule Take 2 capsules (800 mg total) by mouth 2 (two) times daily. (Patient taking differently: Take 400 mg by mouth 3 (three) times daily. ) 120 capsule 3  . hydrOXYzine (ATARAX/VISTARIL) 25 MG tablet TAKE 1 TABLET BY MOUTH ONCE IN THE MORNING, 1 TABLET AT NOON, AND 2 TABLETS IN THE EVENING AS NEEDED (Patient taking differently: Take 25 mg by mouth 3 (three) times daily. TAKE 1 TABLET BY MOUTH ONCE IN THE MORNING, 1 TABLET AT NOON, AND 2 TABLETS IN THE EVENING AS NEEDED) 90 tablet 0  . insulin aspart (NOVOLOG) 100 UNIT/ML injection Inject 5-15 Units into the skin 3 (three) times daily before meals.    . insulin glargine (LANTUS) 100 UNIT/ML injection Inject 73 Units into the skin at bedtime.    Marland Kitchen omeprazole (PRILOSEC)  20 MG capsule Take 2 capsules (40 mg total) by mouth daily. (Patient taking differently: Take  20 mg by mouth 2 (two) times daily before a meal. ) 60 capsule 0  . ondansetron (ZOFRAN) 4 MG tablet Take 1 tablet (4 mg total) by mouth daily as needed for nausea or vomiting. 30 tablet 1  . oxyCODONE-acetaminophen (PERCOCET) 5-325 MG tablet Take 1 tablet by mouth every 4 (four) hours as needed for severe pain. 12 tablet 0  . sertraline (ZOLOFT) 50 MG tablet Take 1 tablet (50 mg total) by mouth daily. 1/2 tab daily x 2 wks then 1 tab PO daily (Patient taking differently: Take 50 mg by mouth daily. ) 30 tablet 3  . warfarin (COUMADIN) 5 MG tablet TAKE 2 TABLETS ON MONDAY, WEDNESDAY AND FRIDAY WITH 1 TABLET ALL OTHER DAYS. (Patient taking differently: Take 5-10 mg by mouth See admin instructions. Take 10 Monday, Wednesday and Friday and 5 mg on Tuesday, Thursday, Saturday and Sunday) 60 tablet 3  . blood glucose meter kit and supplies Dispense based on patient and insurance preference. Use up to four times daily as directed. (FOR ICD-9 250.00, 250.01). 1 each 0  . Blood Glucose Monitoring Suppl (TRUE METRIX METER) w/Device KIT Use as directed 1 kit 0  . doxycycline (VIBRA-TABS) 100 MG tablet Take 1 tablet (100 mg total) by mouth 2 (two) times daily. (Patient not taking: Reported on 06/11/2019) 14 tablet 0  . enoxaparin (LOVENOX) 120 MG/0.8ML injection Inject 0.8 mLs (120 mg total) into the skin every 12 (twelve) hours. (Patient not taking: Reported on 06/11/2019) 20 Syringe 0  . glucose blood test strip Use as instructed 100 each 12  . Insulin Pen Needle 31G X 5 MM MISC Use with Lantus and Novolog injections. 100 each 11  . Insulin Syringe-Needle U-100 (BD INSULIN SYRINGE ULTRAFINE) 31G X 5/16" 0.5 ML MISC Use as directed 100 each 3  . TRUEPLUS LANCETS 28G MISC Use as directed 100 each 6     Objective: Vital signs in last 24 hours: Temp:  [99.1 F (37.3 C)] 99.1 F (37.3 C) (09/07 1217) Pulse Rate:  [95-110]  95 (09/07 1437) Resp:  [15-23] 20 (09/07 1437) BP: (142-159)/(63-98) 142/63 (09/07 1436) SpO2:  [97 %-100 %] 99 % (09/07 1437) Weight:  [120 kg] 120 kg (09/07 1215)  Intake/Output from previous day: No intake/output data recorded. Intake/Output this shift: No intake/output data recorded.   Physical Exam Constitutional:      Appearance: Normal appearance. She is obese. She is not ill-appearing.  HENT:     Head: Normocephalic and atraumatic.  Neck:     Musculoskeletal: Normal range of motion and neck supple.  Cardiovascular:     Rate and Rhythm: Normal rate and regular rhythm.     Heart sounds: Normal heart sounds.  Pulmonary:     Effort: Pulmonary effort is normal. No respiratory distress.     Breath sounds: Normal breath sounds.  Abdominal:     Palpations: Abdomen is soft. There is no mass.     Tenderness: There is right CVA tenderness.  Musculoskeletal: Normal range of motion.        General: No swelling or tenderness.  Skin:    General: Skin is warm and dry.  Neurological:     General: No focal deficit present.     Mental Status: She is alert and oriented to person, place, and time.  Psychiatric:        Mood and Affect: Mood normal.        Behavior: Behavior normal.     Lab Results:  Recent Labs  06/11/19 1245  WBC 22.3*  HGB 12.1  HCT 37.6  PLT 369   BMET Recent Labs    06/11/19 1245  NA 134*  K 3.4*  CL 106  CO2 20*  GLUCOSE 191*  BUN 20  CREATININE 2.34*  CALCIUM 8.8*   PT/INR Recent Labs    06/11/19 1305  LABPROT 33.1*  INR 3.3*   ABG No results for input(s): PHART, HCO3 in the last 72 hours.  Invalid input(s): PCO2, PO2  Studies/Results: Ct Renal Stone Study  Result Date: 06/11/2019 CLINICAL DATA:  RIGHT flank pain for 3 days, some hematuria per patient, suspected recurrent stone disease; history of kidney stones, diabetes mellitus, hypertension, multiple stone extractions and lithotripsy EXAM: CT ABDOMEN AND PELVIS WITHOUT  CONTRAST TECHNIQUE: Multidetector CT imaging of the abdomen and pelvis was performed following the standard protocol without IV contrast. Sagittal and coronal MPR images reconstructed from axial data set. No oral contrast administered for this indication COMPARISON:  02/07/2019 FINDINGS: Lower chest: Lung bases clear Hepatobiliary: Gallbladder and liver normal appearance Pancreas: Normal appearance Spleen: Normal appearance Adrenals/Urinary Tract: RIGHT adrenal gland normal appearance. LEFT adrenal nodule 2.1 x 1.9 cm, low attenuation consistent with adenoma. Atrophic LEFT kidney with multiple calcifications. Foci of gas are present within LEFT kidney as well, present on prior study as well. No perinephric stranding or hydronephrosis. No definite renal mass. Small amount of gas within urinary bladder question catheterization. Significant RIGHT hydronephrosis and hydroureter secondary to a 5 mm mid to distal RIGHT ureteral calculus image 72. Additional small nonobstructing RIGHT renal calculi. Single focus of air is seen within the collecting system at the upper pole the RIGHT kidney, decreased versus prior study. Significant perinephric stranding of RIGHT kidney with fluid/edema extending into anterior pararenal fascia, posterior pararenal fascia and lateral conal fascia. Remaining ureters and bladder otherwise unremarkable. Stomach/Bowel: Normal appendix. Stomach and bowel loops normal appearance. Vascular/Lymphatic: Atherosclerotic calcifications aorta and iliac arteries. Wall stent LEFT common iliac artery. Aorta normal caliber. Scattered normal size lymph nodes. Reproductive: Normal appearing uterus and adnexa/ovaries. Other: No free air or free fluid. Musculoskeletal: No acute osseous findings. IMPRESSION: RIGHT hydronephrosis, hydroureter and significant perinephric edema secondary to a 5 mm mid to distal RIGHT ureteral calculus. Additional BILATERAL non-obstructing renal calculi. Foci of gas within both  kidneys, stable on LEFT and decreased on RIGHT since prior study, question due to catheterization or prior instrumentation. LEFT adrenal adenoma. Electronically Signed   By: Lavonia Dana M.D.   On: 06/11/2019 14:05   Case discussed with EDP.  Pertinent notes, labs and imaging reviewed.   Assessment: Right ureteral stone with obstruction and sepsis.   She will need urgent cystoscopy and right stent insertion with definitive treatment with ureteroscopy at a later date.   Chronic anticoagulation.  Will not need to reverse warfarin for the procedure.    CC: Dr. Lajean Saver and Dr. Ellison Hughs.      Irine Seal 06/11/2019 [phone]

## 2019-06-11 NOTE — H&P (Signed)
History and Physical    Terri Sparks CXK:[id number] DOB: 04/19/1976 DOA: 06/11/2019  PCP: Ladell Pier, MD Patient coming from: Home  Chief Complaint: Right flank pain  HPI: Terri Sparks is a 43 y.o. female with history of IDDM-2, anxiety, depression, left lower extremity DVT, history of hyperparathyroidism/hypercalcemia, HTN, CKD 3, PVD/stent/PCI in 2018, prolonged QT and recurrent kidney stones presenting with worsening right flank pain.  Patient has progressive right flank pain for the last 4 days.  Pain reminds her hip pain when she had renal stone and lithotripsy.  Describes the pain as shooting.  Pain is intermittent.  Pain severity was 10/10 but down to 5/10 after pain medication in ED.  She denies fever but admits to chills.  She denies dysuria or frequency but reports urgency.  She denies hematuria.  Also admits to nausea and emesis.  She had about 8 episodes of emesis today.  Emesis was nonbloody.  Denies diarrhea.  Admits to constipation that she attributes to not eating due to pain and nausea.  Last bowel movement was 4 days ago.  She denies fall or trauma.  She also reports runny nose.  Denies sore throat, cough, chest pain, dyspnea or orthopnea.  She is on warfarin for DVT.  Last dose was last night about 6 PM.   Of note, patient had urologic procedure with left and right ureteral stent exchange on 05/01/2019.  Lives alone.  Smokes about 10 cigarettes a day.  Denies alcohol or recreational drug use.  In ED, mild tachycardia and tachypnea but improved.  Sodium 134.  Potassium 3.4.  Bicarb 20.  Glucose 191.  Creatinine 2.34 (baseline ~2.0).  WBC 22.3.  No differential.  INR 3.3.  Pregnancy test negative.  UA concerning for UTI.  EKG sinus tachycardia.  CT renal stone study revealed right hydronephrosis, hydroureter and significant perinephric edema secondary to 5 mm mid to distal right ureteral calculus and bilateral nonobstructing renal calculi and chronic foci of gas within  both kidneys.  Urology, Dr. [name] consulted by EDP and hospital service was called for admission.   ROS All review of system negative except for pertinent positives and negatives as history of present illness above.  PMH Past Medical History:  Diagnosis Date   Anxiety    Depression    Diabetes ([place])    Type 2   GERD (gastroesophageal reflux disease)    History of blood clots    ,DVT-left leg, and early 2000, had blood clot behind left breast   History of kidney stones    Hypercalcemia    Hyperparathyroidism    Hypertension    had been on Lisinopril and PCP took her off it 4 months ago   Iron deficiency anemia    Left thyroid nodule    Mass of right ovary    Nephrolithiasis    PAOD (peripheral arterial occlusive disease) (HCC)    Peripheral vascular disease (HCC)    Prolonged Q-T interval on ECG 04/19/2019   Renal calculi    Tooth loose    top tooth from the right  is loose    PSH Past Surgical History:  Procedure Laterality Date   ABDOMINAL AORTOGRAM W/LOWER EXTREMITY N/A 11/14/2017   Procedure: ABDOMINAL AORTOGRAM W/LOWER EXTREMITY;  Surgeon: Waynetta Sandy, MD;  Location: [facility];  Service: Cardiovascular;  Laterality: N/A;   ANGIOPLASTY ILLIAC ARTERY Left 02/25/2018   Procedure: BALLOON ANGIOPLASTY LEFT ILIAC ARTERY;  Surgeon: Conrad [place], MD;  Location: [place];  Service: Vascular;  Laterality: Left;   APPLICATION OF WOUND VAC Left 03/03/2018   Procedure: APPLICATION OF WOUND VAC;  Surgeon: Angelia Mould, MD;  [facility];  Service: Vascular;  Laterality: Left;   CYSTOSCOPY W/ URETERAL STENT PLACEMENT Right 06/27/2018   Procedure: CYSTOSCOPY WITH RETROGRADE PYELOGRAM/URETERAL DOUBLE J STENT PLACEMENT;  Surgeon: Ceasar Mons, MD;  Location: [place];  Service: Urology;  Laterality: Right;   CYSTOSCOPY WITH STENT PLACEMENT N/A 02/23/2019   Procedure: CYSTOSCOPY WITH RIGHT URETERAL STENT EXCHANGE/ LEFT  URETERAL STENT PLACEMENT;  Surgeon: Ceasar Mons, MD;  Location: [facility];  Service: Urology;  Laterality: N/A;   CYSTOSCOPY/RETROGRADE/URETEROSCOPY/STONE EXTRACTION WITH BASKET Bilateral 05/01/2019   Procedure: CYSTOSCOPY/URETEROSCOPY/STONE EXTRACTION / LASER LITHOTRIPSY, BILATERAL URETEROSCOPY WITH URETERAL STONE EXTRACTION AND STENT EXCHANGE;  Surgeon: Ceasar Mons, MD;  Location: [facility];  Service: Urology;  Laterality: Bilateral;   EMBOLECTOMY Left 02/24/2017   Procedure: Left Lower Extremity Embolectomy and Angiogram.;  Surgeon: Waynetta Sandy, MD;  Location: [place];  Service: Vascular;  Laterality: Left;   INSERTION OF ILIAC STENT  02/24/2017   Procedure: INSERTION OF Common ILIAC STENT;  Surgeon: Waynetta Sandy, MD;  Location: [place];  Service: Vascular;;   INTRAOPERATIVE ARTERIOGRAM Left 02/25/2018   Procedure: INTRA OPERATIVE ARTERIOGRAM WITH LEFT LEG RUNOFF;  Surgeon: Conrad [place], MD;  Location: [place];  Service: Vascular;  Laterality: Left;   LOWER EXTREMITY ANGIOGRAM Left 02/24/2017   Procedure: Aortagram, Left lower extremity Run-off;  Surgeon: Waynetta Sandy, MD;  Location: [place];  Service: Vascular;  Laterality: Left;   PATCH ANGIOPLASTY Left 02/25/2018   Procedure: PATCH ANGIOPLASTY LEFT SUPERFICIAL FEMORAL ARTERY WITH BOVINE PATCH;  Surgeon: Conrad [place], MD;  Location: [place];  Service: Vascular;  Laterality: Left;   PERCUTANEOUS VENOUS THROMBECTOMY,LYSIS WITH INTRAVASCULAR ULTRASOUND (IVUS) Left 05/12/2018   Procedure: MECHANICAL THROMBECTOMY LEFT LEG, BALLOON ANGIOPLASTY LEFT ANTERIOR TIBIAL ARTERY, AORTOGRAM WITH LEFT LEG RUNOFF;  Surgeon: Serafina Mitchell, MD;  Location: [place];  Service: Vascular;  Laterality: Left;   removal of parathyroid adenoma  5/11   THROMBECTOMY FEMORAL ARTERY Left 02/25/2018   Procedure: LEFT ILIAC AND POPLITEAL ARTERY THROMBECTOMY;  Surgeon: Conrad [place], MD;  Location: [facility] [facility];   Service: Vascular;  Laterality: Left;   TUBAL LIGATION     WOUND DEBRIDEMENT Left 03/03/2018   Procedure: DEBRIDEMENT WOUND;  Surgeon: Angelia Mould, MD;  Location: [place];  Service: Vascular;  Laterality: Left;   WOUND EXPLORATION Left 03/03/2018   Procedure: WOUND EXPLORATION;  Surgeon: Angelia Mould, MD;  Location[facility] OR;  Service: Vascular;  Laterality: Left;   Fam HX Family History  Problem Relation Age of Onset   Depression Mother    Diabetes Father    Heart failure Father     Social Hx  reports that she has been smoking cigarettes. She has a 3.75 pack-year smoking history. She has never used smokeless tobacco. She reports current alcohol use. She reports current drug use. Drug: Marijuana.  Allergy Allergies  Allergen Reactions   Ciprofloxacin Hcl Hives   Macrobid [Nitrofurantoin Macrocrystal] Hives, Shortness Of Breath and Rash   Other Hives, Shortness Of Breath and Rash    NO "-CILLINS"!!!   Penicillins Shortness Of Breath    Had had cephalosporins without incident Has patient had a PCN reaction causing immediate rash, facial/tongue/throat swelling, SOB or lightheadedness with hypotension: Yes Has patient had a PCN reaction causing severe rash involving mucus membranes  or skin necrosis: Unk Has patient had a PCN reaction that required hospitalization: Unk Has patient had a PCN reaction occurring within the last 10 years: No If all of the above answers are "NO", then may proceed with Cephalosporin use.    Sulfa Antibiotics Hives, Shortness Of Breath and Rash   Dilaudid [Hydromorphone Hcl] Other (See Comments)    Asystole per pt report   Lexapro [Escitalopram Oxalate] Other (See Comments)    "I just did not like it."   Home Meds Prior to Admission medications   Medication Sig Start Date End Date Taking? Authorizing Provider  albuterol (PROAIR HFA) 108 (90 Base) MCG/ACT inhaler Inhale 2 puffs into the lungs every 6 (six) hours as needed for  wheezing or shortness of breath. 12/18/18  Yes Ladell Pier, MD  amLODipine (NORVASC) 5 MG tablet Take 1 tablet (5 mg total) by mouth daily. 04/19/19  Yes Ladell Pier, MD  aspirin EC 81 MG tablet Take 81 mg by mouth daily.   Yes [provider]  buPROPion (WELLBUTRIN SR) 150 MG 12 hr tablet Take 1 tablet (150 mg total) by mouth 2 (two) times daily. 12/18/18  Yes Ladell Pier, MD  ferrous sulfate 325 (65 FE) MG tablet Take 1 tablet (325 mg total) by mouth daily with breakfast. 01/31/18  Yes Ladell Pier, MD  gabapentin (NEURONTIN) 400 MG capsule Take 2 capsules (800 mg total) by mouth 2 (two) times daily. Patient taking differently: Take 400 mg by mouth 3 (three) times daily.  01/08/19  Yes Ladell Pier, MD  hydrOXYzine (ATARAX/VISTARIL) 25 MG tablet TAKE 1 TABLET BY MOUTH ONCE IN THE MORNING, 1 TABLET AT NOON, AND 2 TABLETS IN THE EVENING AS NEEDED Patient taking differently: Take 25 mg by mouth 3 (three) times daily. TAKE 1 TABLET BY MOUTH ONCE IN THE MORNING, 1 TABLET AT NOON, AND 2 TABLETS IN THE EVENING AS NEEDED 03/23/19  Yes Ladell Pier, MD  insulin aspart (NOVOLOG) 100 UNIT/ML injection Inject 5-15 Units into the skin 3 (three) times daily before meals.   Yes [provider]  insulin glargine (LANTUS) 100 UNIT/ML injection Inject 73 Units into the skin at bedtime.   Yes [provider]  omeprazole (PRILOSEC) 20 MG capsule Take 2 capsules (40 mg total) by mouth daily. Patient taking differently: Take 20 mg by mouth 2 (two) times daily before a meal.  12/18/18  Yes Ladell Pier, MD  ondansetron [facility]) 4 MG tablet Take 1 tablet (4 mg total) by mouth daily as needed for nausea or vomiting. 02/23/19 02/23/20 Yes Ceasar Mons, MD  oxyCODONE-acetaminophen (PERCOCET) 5-325 MG tablet Take 1 tablet by mouth every 4 (four) hours as needed for severe pain. 05/01/19  Yes Ceasar Mons, MD  sertraline (ZOLOFT) 50 MG tablet  Take 1 tablet (50 mg total) by mouth daily. 1/2 tab daily x 2 wks then 1 tab PO daily Patient taking differently: Take 50 mg by mouth daily.  04/19/19  Yes Ladell Pier, MD  warfarin (COUMADIN) 5 MG tablet TAKE 2 TABLETS ON MONDAY, WEDNESDAY AND FRIDAY WITH 1 TABLET ALL OTHER DAYS. Patient taking differently: Take 5-10 mg by mouth See admin instructions. Take 10 Monday, Wednesday and Friday and 5 mg on Tuesday, Thursday, Saturday and Sunday 04/19/19  Yes Ladell Pier, MD  blood glucose meter kit and supplies Dispense based on patient and insurance preference. Use up to four times daily as directed. (FOR ICD-9 250.00, 250.01). 03/03/17  Mikhail, [place], DO  Blood Glucose Monitoring Suppl (TRUE METRIX METER) w/Device KIT Use as directed 08/01/17   Ladell Pier, MD  glucose blood test strip Use as instructed 10/23/18   Ladell Pier, MD  Insulin Pen Needle 31G X 5 MM MISC Use with Lantus and Novolog injections. 04/19/19   Ladell Pier, MD  Insulin Syringe-Needle U-100 (BD INSULIN SYRINGE ULTRAFINE) 31G X 5/16" 0.5 ML MISC Use as directed 04/28/17   Ladell Pier, MD  TRUEPLUS LANCETS 28G MISC Use as directed 08/01/17   Ladell Pier, MD    Physical Exam: Vitals:   06/11/19 1437 06/11/19 1500 06/11/19 1530 06/11/19 1626  BP:  132/79 129/84 139/88  Pulse: 95 99 (!) 103 (!) 101  Resp: 20 (!) 21 18 18   Temp:    98.4 F (36.9 C)  TempSrc:    Oral  SpO2: 99% 95% 98% 100%  Weight:        GENERAL: No acute distress.  Appears well.  HEENT: MMM.  Vision and hearing grossly intact.  NECK: Supple.  No apparent JVD.  RESP:  No IWOB. Good air movement bilaterally. CVS: Tachycardic to 110s.  Regular rhythm. Heart sounds normal.  ABD/GI/GU: Bowel sounds present. Soft. Non tender.  No suprapubic or CVA tenderness. MSK/EXT:  Moves extremities. No apparent deformity or edema.  SKIN: no apparent skin lesion or wound NEURO: Awake, alert and oriented appropriately.  No  gross deficit.  PSYCH: Calm. Normal affect.   Personally Reviewed Radiological Exams Ct Renal Stone Study  Result Date: 06/11/2019 CLINICAL DATA:  RIGHT flank pain for 3 days, some hematuria per patient, suspected recurrent stone disease; history of kidney stones, diabetes mellitus, hypertension, multiple stone extractions and lithotripsy EXAM: CT ABDOMEN AND PELVIS WITHOUT CONTRAST TECHNIQUE: Multidetector CT imaging of the abdomen and pelvis was performed following the standard protocol without IV contrast. Sagittal and coronal MPR images reconstructed from axial data set. No oral contrast administered for this indication COMPARISON:  02/07/2019 FINDINGS: Lower chest: Lung bases clear Hepatobiliary: Gallbladder and liver normal appearance Pancreas: Normal appearance Spleen: Normal appearance Adrenals/Urinary Tract: RIGHT adrenal gland normal appearance. LEFT adrenal nodule 2.1 x 1.9 cm, low attenuation consistent with adenoma. Atrophic LEFT kidney with multiple calcifications. Foci of gas are present within LEFT kidney as well, present on prior study as well. No perinephric stranding or hydronephrosis. No definite renal mass. Small amount of gas within urinary bladder question catheterization. Significant RIGHT hydronephrosis and hydroureter secondary to a 5 mm mid to distal RIGHT ureteral calculus image 72. Additional small nonobstructing RIGHT renal calculi. Single focus of air is seen within the collecting system at the upper pole the RIGHT kidney, decreased versus prior study. Significant perinephric stranding of RIGHT kidney with fluid/edema extending into anterior pararenal fascia, posterior pararenal fascia and lateral conal fascia. Remaining ureters and bladder otherwise unremarkable. Stomach/Bowel: Normal appendix. Stomach and bowel loops normal appearance. Vascular/Lymphatic: Atherosclerotic calcifications aorta and iliac arteries. Wall stent LEFT common iliac artery. Aorta normal caliber. Scattered  normal size lymph nodes. Reproductive: Normal appearing uterus and adnexa/ovaries. Other: No free air or free fluid. Musculoskeletal: No acute osseous findings. IMPRESSION: RIGHT hydronephrosis, hydroureter and significant perinephric edema secondary to a 5 mm mid to distal RIGHT ureteral calculus. Additional BILATERAL non-obstructing renal calculi. Foci of gas within both kidneys, stable on LEFT and decreased on RIGHT since prior study, question due to catheterization or prior instrumentation. LEFT adrenal adenoma. Electronically Signed   By: Crist Infante.D.  On: 06/11/2019 14:05     Personally Reviewed Labs: CBC: Recent Labs  Lab 06/11/19 1245  WBC 22.3*  HGB 12.1  HCT 37.6  MCV 88.9  PLT 505   Basic Metabolic Panel: Recent Labs  Lab 06/11/19 1245  NA 134*  K 3.4*  CL 106  CO2 20*  GLUCOSE 191*  BUN 20  CREATININE 2.34*  CALCIUM 8.8*   GFR: Estimated Creatinine Clearance: 41.2 mL/min (A) (by C-G formula based on SCr of 2.34 mg/dL (H)). Liver Function Tests: No results for input(s): AST, ALT, ALKPHOS, BILITOT, PROT, ALBUMIN in the last 168 hours. No results for input(s): LIPASE, AMYLASE in the last 168 hours. No results for input(s): AMMONIA in the last 168 hours. Coagulation Profile: Recent Labs  Lab 06/11/19 1305  INR 3.3*   Cardiac Enzymes: No results for input(s): CKTOTAL, CKMB, CKMBINDEX, TROPONINI in the last 168 hours. BNP (last 3 results) No results for input(s): PROBNP in the last 8760 hours. HbA1C: No results for input(s): HGBA1C in the last 72 hours. CBG: No results for input(s): GLUCAP in the last 168 hours. Lipid Profile: No results for input(s): CHOL, HDL, LDLCALC, TRIG, CHOLHDL, LDLDIRECT in the last 72 hours. Thyroid Function Tests: No results for input(s): TSH, T4TOTAL, FREET4, T3FREE, THYROIDAB in the last 72 hours. Anemia Panel: No results for input(s): VITAMINB12, FOLATE, FERRITIN, TIBC, IRON, RETICCTPCT in the last 72 hours. Urine  analysis:    Component Value Date/Time   COLORURINE Babita (A) 06/11/2019 1340   APPEARANCEUR TURBID (A) 06/11/2019 1340   LABSPEC 1.012 06/11/2019 1340   PHURINE 6.0 06/11/2019 1340   GLUCOSEU 50 (A) 06/11/2019 1340   HGBUR LARGE (A) 06/11/2019 1340   BILIRUBINUR NEGATIVE 06/11/2019 1340   BILIRUBINUR negative 04/19/2019 1206   BILIRUBINUR negative 09/29/2017 1317   KETONESUR NEGATIVE 06/11/2019 1340   PROTEINUR 100 (A) 06/11/2019 1340   UROBILINOGEN 0.2 04/19/2019 1206   UROBILINOGEN 0.2 06/15/2018 1530   NITRITE NEGATIVE 06/11/2019 1340   LEUKOCYTESUR LARGE (A) 06/11/2019 1340    Sepsis Labs:  None  Personally Reviewed EKG:  Sinus tachycardia without acute ischemic finding but QTC to 513.  Assessment/Plan Right pyelonephritis/right hydronephrosis/hydroureter -Patient with history of recurrent renal stone -Patient presented with right flank pain, chills, nausea, vomiting and urgency. -Urinalysis concerning for UTI. -CT renal-5 mm mid to distal right ureteral calculus with right hydronephrosis, hydroureter and perinephric edema -Urology, Dr. [name] consulted in ED. -Continue IV ceftriaxone -IV morphine for pain control. -Add urine culture to previous collection. -N.p.o.  Tachycardia/leukocytosis: Likely due to the above. -IV NS-KCl 40 at 100 cc an hour for 24 hours -Continue trending  IDDM-2 with hyperglycemia and CKD-4: -We will continue home Lantus at half dose -SSI-moderate and CBG monitoring -Check A1c -Recommend starting  Lower extremity DVT: On warfarin.  INR supratherapeutic at 3.3. -Hold home warfarin -Consult pharmacy for heparin in the morning -PTT/PT/INR in the morning  PVD/stent/PCI 2018 -Continue home aspirin -Needs to be on a statin unless contraindication.  I do not see this in her allergy list.  Prolonged QT: QTC prolonged to 513.  Could be exaggerated due to tachycardia. 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  Nausea/vomiting: patient has QT prolongation on EKG -PRN Atarax cautiously.  Constipation: Likely due to poor p.o. intake -Bowel regimen  Hypokalemia: Likely due to emesis -Replenish and recheck -Check magnesium  Anxiety/depression -Resume home  medications once she started taking p.o.  Morbid obesity: BMI 41.43. -Encourage lifestyle change -Could benefit from GLP-1 inhibitors given diabetes  Tobacco use disorder: Admits smoking 10 cigarettes a day. -Encourage cessation -Nicotine patch.  COVID-19 screen negative.  DVT prophylaxis: None.  INR supratherapeutic  Code Status: Full code Family Communication: Patient would update family let me know if any question.  Disposition Plan: Admit to MedSurg Consults called: Urology, Dr. [name] consulted by EDP Admission status: Inpatient   Mercy Riding MD [facility]  If 7PM-7AM, please contact night-coverage [email or web address] Password [facility]  06/11/2019, 4:42 PM With

## 2019-06-11 NOTE — Anesthesia Procedure Notes (Addendum)
Procedure Name: Intubation Date/Time: 06/11/2019 5:37 PM Performed by: Eben Burow, CRNA Pre-anesthesia Checklist: Patient identified, Emergency Drugs available, Suction available, Patient being monitored and Timeout performed Patient Re-evaluated:Patient Re-evaluated prior to induction Oxygen Delivery Method: Circle system utilized Preoxygenation: Pre-oxygenation with 100% oxygen Induction Type: IV induction and Rapid sequence Laryngoscope Size: Mac, Glidescope and 3 Grade View: Grade I Tube type: Oral Tube size: 7.0 mm Number of attempts: 1 Airway Equipment and Method: Stylet Placement Confirmation: ETT inserted through vocal cords under direct vision,  positive ETCO2 and breath sounds checked- equal and bilateral Secured at: 22 cm Tube secured with: Tape Dental Injury: Teeth and Oropharynx as per pre-operative assessment

## 2019-06-11 NOTE — ED Triage Notes (Signed)
Pt BIBA from home. Pt c/o right sided flank pain x 3 days. Pt c/o N/V. Hx of kidney stones.

## 2019-06-11 NOTE — ED Notes (Signed)
ED TO INPATIENT HANDOFF REPORT  ED Nurse Name and Phone #: [phone]  S Name/Age/Gender Terri Sparks 43 y.o. female Room/Bed: WA22/WA22  Code Status   Code Status: Prior  Home/SNF/Other Home Patient oriented to: self, place, time and situation Is this baseline? Yes   Triage Complete: Triage complete  Chief Complaint right flank pain  Triage Note Pt BIBA from home. Pt c/o right sided flank pain x 3 days. Pt c/o N/V. Hx of kidney stones.    Allergies Allergies  Allergen Reactions  . Ciprofloxacin Hcl Hives  . Macrobid [Nitrofurantoin Macrocrystal] Hives, Shortness Of Breath and Rash  . Other Hives, Shortness Of Breath and Rash    NO "-CILLINS"!!!  . Penicillins Shortness Of Breath    Had had cephalosporins without incident Has patient had a PCN reaction causing immediate rash, facial/tongue/throat swelling, SOB or lightheadedness with hypotension: Yes Has patient had a PCN reaction causing severe rash involving mucus membranes or skin necrosis: Unk Has patient had a PCN reaction that required hospitalization: Unk Has patient had a PCN reaction occurring within the last 10 years: No If all of the above answers are "NO", then may proceed with Cephalosporin use.   . Sulfa Antibiotics Hives, Shortness Of Breath and Rash  . Dilaudid [Hydromorphone Hcl] Other (See Comments)    Asystole per pt report  . Lexapro [Escitalopram Oxalate] Other (See Comments)    "I just did not like it."    Level of Care/Admitting Diagnosis ED Disposition    ED Disposition Condition [place]: [place] [[id number]]  Level of Care: Med-Surg [16]  Covid Evaluation: Asymptomatic Screening Protocol (No Symptoms)  Diagnosis: Hydronephrosis with obstructing calculus [id number]  Admitting Physician: Mercy Riding [id number]  Attending Physician: Mercy Riding [id number]  Estimated length of stay: past midnight tomorrow  Certification:: I certify this patient  will need inpatient services for at least 2 midnights  PT Class (Do Not Modify): Inpatient [101]  PT Acc Code (Do Not Modify): Private [1]       B Medical/Surgery History Past Medical History:  Diagnosis Date  . Anxiety   . Depression   . Diabetes ([place])    Type 2  . GERD (gastroesophageal reflux disease)   . History of blood clots    ,DVT-left leg, and early 2000, had blood clot behind left breast  . History of kidney stones   . Hypercalcemia   . Hyperparathyroidism   . Hypertension    had been on Lisinopril and PCP took her off it 4 months ago  . Iron deficiency anemia   . Left thyroid nodule   . Mass of right ovary   . Nephrolithiasis   . 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 Peripheral vascular disease ([place])   . Prolonged Q-T interval on ECG 04/19/2019  . Renal calculi   . Tooth loose    top tooth from the right  is loose    Past Surgical History:  Procedure Laterality Date  . ABDOMINAL AORTOGRAM W/LOWER EXTREMITY N/A 11/14/2017   Procedure: ABDOMINAL AORTOGRAM W/LOWER EXTREMITY;  Surgeon: Waynetta Sandy, MD;  Location: [facility];  Service: Cardiovascular;  Laterality: N/A;  . ANGIOPLASTY ILLIAC ARTERY Left 02/25/2018   Procedure: BALLOON ANGIOPLASTY LEFT ILIAC ARTERY;  Surgeon: Conrad Hollymead, MD;  Location: [facility];  Service: Vascular;  Laterality: Left;  . APPLICATION OF WOUND VAC Left 03/03/2018   Procedure: APPLICATION OF WOUND VAC;  Surgeon: Angelia Mould, MD;  Location: [facility];  Service: Vascular;  Laterality: Left;  . CYSTOSCOPY W/ URETERAL STENT PLACEMENT Right 06/27/2018   Procedure: CYSTOSCOPY WITH RETROGRADE PYELOGRAM/URETERAL DOUBLE J STENT PLACEMENT;  Surgeon: Ceasar Mons, MD;  Location: [facility];  Service: Urology;  Laterality: Right;  . CYSTOSCOPY WITH STENT PLACEMENT N/A 02/23/2019   Procedure: CYSTOSCOPY WITH RIGHT URETERAL STENT EXCHANGE/ LEFT URETERAL STENT PLACEMENT;  Surgeon: Ceasar Mons, MD;  Location: [facility];  Service: Urology;  Laterality: N/A;  . CYSTOSCOPY/RETROGRADE/URETEROSCOPY/STONE EXTRACTION WITH BASKET Bilateral 05/01/2019   Procedure: CYSTOSCOPY/URETEROSCOPY/STONE EXTRACTION / LASER LITHOTRIPSY, BILATERAL URETEROSCOPY WITH URETERAL STONE EXTRACTION AND STENT EXCHANGE;  Surgeon: Ceasar Mons, MD;  Location: [facility];  Service: Urology;  Laterality: Bilateral;  . EMBOLECTOMY Left 02/24/2017   Procedure: Left Lower Extremity Embolectomy and Angiogram.;  Surgeon: Waynetta Sandy, MD;  Location: [place];  Service: Vascular;  Laterality: Left;  . INSERTION OF ILIAC STENT  02/24/2017   Procedure: INSERTION OF Common ILIAC STENT;  Surgeon: Waynetta Sandy, MD;  Location: [facility];  Service: Vascular;;  . INTRAOPERATIVE ARTERIOGRAM Left 02/25/2018   Procedure: INTRA OPERATIVE ARTERIOGRAM WITH LEFT LEG RUNOFF;  Surgeon: Conrad Waverly, MD;  Location: [place];  Service: Vascular;  Laterality: Left;  . LOWER EXTREMITY ANGIOGRAM Left 02/24/2017   Procedure: Aortagram, Left lower extremity Run-off;  Surgeon: Waynetta Sandy, MD;  Location: [place];  Service: Vascular;  Laterality: Left;  . PATCH ANGIOPLASTY Left 02/25/2018   Procedure: PATCH ANGIOPLASTY LEFT SUPERFICIAL FEMORAL ARTERY WITH BOVINE PATCH;  Surgeon: Conrad Cowan, MD;  Location: [facility];  Service: Vascular;  Laterality: Left;  . PERCUTANEOUS VENOUS THROMBECTOMY,LYSIS WITH INTRAVASCULAR ULTRASOUND (IVUS) Left 05/12/2018   Procedure: MECHANICAL THROMBECTOMY LEFT LEG, BALLOON ANGIOPLASTY LEFT ANTERIOR TIBIAL ARTERY, AORTOGRAM WITH LEFT LEG RUNOFF;  Surgeon: Serafina Mitchell, MD;  Location: [facility];  Service: Vascular;  Laterality: Left;  . 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 THROMBECTOMY FEMORAL ARTERY Left 02/25/2018   Procedure: LEFT ILIAC AND POPLITEAL ARTERY THROMBECTOMY;  Surgeon: Conrad New Site, MD;  Location: [facility];  Service: Vascular;  Laterality: Left;  . TUBAL  LIGATION    . WOUND DEBRIDEMENT Left 03/03/2018   Procedure: DEBRIDEMENT WOUND;  Surgeon: Angelia Mould, MD;  Location: [facility];  Service: Vascular;  Laterality: Left;  . WOUND EXPLORATION Left 03/03/2018   Procedure: WOUND EXPLORATION;  Surgeon: Angelia Mould, MD;  Location: [facility];  Service: Vascular;  Laterality: Left;     A IV Location/Drains/Wounds Patient Lines/Drains/Airways Status   Active Line/Drains/Airways    Name:   Placement date:   Placement time:   Site:   Days:   Peripheral IV 06/11/19 Left Antecubital   06/11/19    1245    Antecubital   less than 1   Negative Pressure Wound Therapy Groin Left   03/03/18    1128    -   465   Ureteral Drain/Stent Left ureter 6 Fr.   05/01/19    0856    Left ureter   41   Ureteral Drain/Stent Right ureter 6 Fr.   05/01/19    0923    Right ureter   41   Incision (Closed) 02/25/18 Groin Left   02/25/18    1020     471   Incision (Closed) 03/03/18 Groin Left   03/03/18    1133     465   Incision (Closed) 05/12/18  Groin Right   05/12/18    1921     395   Incision (Closed) 05/01/19 Perineum Other (Comment)   05/01/19    0718     41   Wound / Incision (Open or Dehisced) 05/12/18 Incision - Open;Other (Comment) Groin Left   05/12/18    2208    Groin   395          Intake/Output Last 24 hours No intake or output data in the 24 hours ending 06/11/19 1544  Labs/Imaging Results for orders placed or performed during the hospital encounter of 06/11/19 (from the past 48 hour(s))  CBC     Status: Abnormal   Collection Time: 06/11/19 12:45 PM  Result Value Ref Range   WBC 22.3 (H) 4.0 - 10.5 K/uL   RBC 4.23 3.87 - 5.11 MIL/uL   Hemoglobin 12.1 12.0 - 15.0 g/dL   HCT 37.6 36.0 - 46.0 %   MCV 88.9 80.0 - 100.0 fL   MCH 28.6 26.0 - 34.0 pg   MCHC 32.2 30.0 - 36.0 g/dL   RDW 16.8 (H) 11.5 - 15.5 %   Platelets 369 150 - 400 K/uL   nRBC 0.0 0.0 - 0.2 %    Comment: Performed at [facility], [place] [address]  [place], [place] [id number]  Basic metabolic panel     Status: Abnormal   Collection Time: 06/11/19 12:45 PM  Result Value Ref Range   Sodium 134 (L) 135 - 145 mmol/L   Potassium 3.4 (L) 3.5 - 5.1 mmol/L   Chloride 106 98 - 111 mmol/L   CO2 20 (L) 22 - 32 mmol/L   Glucose, Bld 191 (H) 70 - 99 mg/dL   BUN 20 6 - 20 mg/dL   Creatinine, Ser 2.34 (H) 0.44 - 1.00 mg/dL   Calcium 8.8 (L) 8.9 - 10.3 mg/dL   GFR calc non Af Amer 24 (L) >60 mL/min   GFR calc Af Amer 28 (L) >60 mL/min   Anion gap 8 5 - 15    Comment: Performed at [facility], [place] [address], [place], [place] [address]  I-Stat beta hCG blood, ED     Status: None   Collection Time: 06/11/19 12:52 PM  Result Value Ref Range   I-stat hCG, quantitative <5.0 <5 mIU/mL   Comment 3            Comment:   GEST. AGE      CONC.  (mIU/mL)   <=1 WEEK        5 - 50     2 WEEKS       50 - 500     3 WEEKS       100 - 10,000     4 WEEKS     1,000 - 30,000        FEMALE AND NON-PREGNANT FEMALE:     LESS THAN 5 mIU/mL   Protime-INR     Status: Abnormal   Collection Time: 06/11/19  1:05 PM  Result Value Ref Range   Prothrombin Time 33.1 (H) 11.4 - 15.2 seconds   INR 3.3 (H) 0.8 - 1.2    Comment: (NOTE) INR goal varies based on device and disease states. Performed at [facility], [place] [address] [place], [place] [address]   Urinalysis, Routine w reflex microscopic     Status: Abnormal   Collection Time: 06/11/19  1:40 PM  Result Value Ref Range   Color, Urine  Jehieli (A) YELLOW    Comment: BIOCHEMICALS MAY BE AFFECTED BY COLOR   APPearance TURBID (A) CLEAR   Specific Gravity, Urine 1.012 1.005 - 1.030   pH 6.0 5.0 - 8.0   Glucose, UA 50 (A) NEGATIVE mg/dL   Hgb urine dipstick LARGE (A) NEGATIVE   Bilirubin Urine NEGATIVE NEGATIVE   Ketones, ur NEGATIVE NEGATIVE mg/dL   Protein, ur 100 (A) NEGATIVE mg/dL   Nitrite NEGATIVE NEGATIVE   Leukocytes,Ua LARGE (A) NEGATIVE   RBC / HPF >50 (H) 0 - 5 RBC/hpf    WBC, UA >50 (H) 0 - 5 WBC/hpf   Bacteria, UA MANY (A) NONE SEEN   Squamous Epithelial / LPF 11-20 0 - 5   WBC Clumps PRESENT     Comment: Performed at [facility], [place] [address], [place], [place] [address]   Ct Renal Stone Study  Result Date: 06/11/2019 CLINICAL DATA:  RIGHT flank pain for 3 days, some hematuria per patient, suspected recurrent stone disease; history of kidney stones, diabetes mellitus, hypertension, multiple stone extractions and lithotripsy EXAM: CT ABDOMEN AND PELVIS WITHOUT CONTRAST TECHNIQUE: Multidetector CT imaging of the abdomen and pelvis was performed following the standard protocol without IV contrast. Sagittal and coronal MPR images reconstructed from axial data set. No oral contrast administered for this indication COMPARISON:  02/07/2019 FINDINGS: Lower chest: Lung bases clear Hepatobiliary: Gallbladder and liver normal appearance Pancreas: Normal appearance Spleen: Normal appearance Adrenals/Urinary Tract: RIGHT adrenal gland normal appearance. LEFT adrenal nodule 2.1 x 1.9 cm, low attenuation consistent with adenoma. Atrophic LEFT kidney with multiple calcifications. Foci of gas are present within LEFT kidney as well, present on prior study as well. No perinephric stranding or hydronephrosis. No definite renal mass. Small amount of gas within urinary bladder question catheterization. Significant RIGHT hydronephrosis and hydroureter secondary to a 5 mm mid to distal RIGHT ureteral calculus image 72. Additional small nonobstructing RIGHT renal calculi. Single focus of air is seen within the collecting system at the upper pole the RIGHT kidney, decreased versus prior study. Significant perinephric stranding of RIGHT kidney with fluid/edema extending into anterior pararenal fascia, posterior pararenal fascia and lateral conal fascia. Remaining ureters and bladder otherwise unremarkable. Stomach/Bowel: Normal appendix. Stomach and bowel loops normal  appearance. Vascular/Lymphatic: Atherosclerotic calcifications aorta and iliac arteries. Wall stent LEFT common iliac artery. Aorta normal caliber. Scattered normal size lymph nodes. Reproductive: Normal appearing uterus and adnexa/ovaries. Other: No free air or free fluid. Musculoskeletal: No acute osseous findings. IMPRESSION: RIGHT hydronephrosis, hydroureter and significant perinephric edema secondary to a 5 mm mid to distal RIGHT ureteral calculus. Additional BILATERAL non-obstructing renal calculi. Foci of gas within both kidneys, stable on LEFT and decreased on RIGHT since prior study, question due to catheterization or prior instrumentation. LEFT adrenal adenoma. Electronically Signed   By: Lavonia Dana M.D.   On: 06/11/2019 14:05    Pending Labs Unresulted Labs (From admission, onward)    Start     Ordered   06/11/19 1506  SARS Coronavirus [facility] order, Performed in [facility] hospital lab) Nasopharyngeal Nasopharyngeal Swab  (Symptomatic/High Risk of Exposure/Tier 1 Patients Labs with Precautions)  Once,   STAT    Question Answer Comment  Is this test for diagnosis or screening Diagnosis of ill patient   Symptomatic for COVID-19 as defined by CDC Yes   Date of Symptom Onset 06/08/2019   Hospitalized for COVID-19 Yes   Admitted to ICU for COVID-19 No   Previously tested for COVID-19 Yes  Resident in a congregate (group) care setting No   Employed in healthcare setting No   Pregnant No      06/11/19 1505          Vitals/Pain Today's Vitals   06/11/19 1436 06/11/19 1437 06/11/19 1500 06/11/19 1530  BP: (!) 142/63  132/79 129/84  Pulse: 98 95 99 (!) 103  Resp: 20 20 (!) 21 18  Temp:      SpO2: 97% 99% 95% 98%  Weight:      PainSc:        Isolation Precautions No active isolations  Medications Medications  cefTRIAXone (ROCEPHIN) 1 g in sodium chloride 0.9 % 100 mL IVPB (1 g Intravenous New Bag/Given 06/11/19 1523)  morphine 4 MG/ML injection 4 mg (4 mg Intravenous  Given 06/11/19 1303)  promethazine (PHENERGAN) injection 12.5 mg (12.5 mg Intravenous Given 06/11/19 1301)  morphine 4 MG/ML injection 4 mg (4 mg Intravenous Given 06/11/19 1519)  LORazepam (ATIVAN) injection 1 mg (1 mg Intravenous Given 06/11/19 1517)    Mobility walks Low fall risk           R Recommendations: See Admitting Provider Note  Report given to:   Additional Notes:

## 2019-06-11 NOTE — ED Notes (Signed)
Patient made aware we need urine sample.   Patient transported to CT.

## 2019-06-11 NOTE — Op Note (Signed)
Procedure: 1.  Cystoscopy with right retrograde pyelogram and interpretation. 2.  Cystoscopy with insertion of right double-J stent.  Preop diagnosis: Right mid to distal ureteral stone with sepsis.  Postop diagnosis: Same.  Surgeon: Dr. Irine Seal.  Anesthesia: General.  Specimen: Urine culture from right renal pelvis.  Drain: 6 [place] by 24 cm right contour double-J stent and 16 French Foley catheter.  EBL: None.  Complications: None.  Indications: The patient is a 43 year old female with a history of recurrent urolithiasis and infection who presented with right flank pain and fever and was found to have a 5 mm right mid to distal ureteral stone with obstruction and signs of sepsis.  It was felt that ureteral stenting was indicated.  Procedure: She was taken the operating room where she was given a gram of Rocephin having received a gram earlier today as well.  A general anesthetic was induced.  She was placed in lithotomy position and fitted with PAS hose.  Her perineum and genitalia were prepped with Betadine solution and she was draped in usual sterile fashion.  Cystoscopy was performed using a 23 [place] scope and 30 degree lens.  Examination revealed a normal urethra.  The bladder urine was quite turbid and required irrigation to provide visualization.  The bladder mucosa was diffusely inflamed with some edema around the right ureteral orifice.  The left ureteral orifice was unremarkable.  No tumors or stones were seen.  A 5 French opening catheter was placed in the right ureteral orifice and a very gentle retrograde pyelogram was performed with Omnipaque.  The retrograde pyelogram demonstrated a normal right distal ureter with a filling defect at the location of the stone and proximal dilation.  I did not try to fill beyond the stone to the kidney.  A sensor wire was passed through the catheter to the kidney and the catheter was then advanced over the wire to the kidney.  The wire  was removed and a brisk hydronephrotic drip was noted.  A specimen was collected for culture from the right renal pelvis.  The wire was then reinserted to the kidney and the open ended catheter was removed.  A 6 French by 24 cm Contour double-J stent was advanced over the wire under fluoroscopic guidance.  The wire was removed, leaving a good curl in the kidney and a good coil in the bladder.  The cystoscope was removed and a 16 French Foley catheter was inserted.  The balloon was filled with 10 mL of sterile fluid and the catheter was placed to straight drainage.  She was taken down from lithotomy position, her anesthetic was reversed and she was moved to the recovery room in stable condition.  There were no complications.

## 2019-06-11 NOTE — ED Notes (Signed)
Transport has been called.  
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
## 2019-06-11 NOTE — Anesthesia Postprocedure Evaluation (Signed)
Anesthesia Post Note  Patient: [name]  Procedure(s) Performed: CYSTOSCOPY,RIGHT RETROGRADE WITH STENT PLACEMENT (Right Ureter)     Patient location during evaluation: PACU Anesthesia Type: General Level of consciousness: awake and alert, oriented and patient cooperative Pain management: pain level controlled Vital Signs Assessment: post-procedure vital signs reviewed and stable Respiratory status: spontaneous breathing, nonlabored ventilation and respiratory function stable Cardiovascular status: blood pressure returned to baseline and stable Postop Assessment: no apparent nausea or vomiting Anesthetic complications: no    Last Vitals:  Vitals:   06/11/19 1626 06/11/19 1815  BP: 139/88 129/82  Pulse: (!) 101 99  Resp: 18 18  Temp: 36.9 C 37.1 C  SpO2: 100% 100%    Last Pain:  Vitals:   06/11/19 1815  TempSrc:   PainSc: 0-No pain                 Onyx Edgley,E. Rachelann Enloe

## 2019-06-11 NOTE — ED Notes (Signed)
Hospitlist at bedside. Will transport after.

## 2019-06-11 NOTE — Anesthesia Preprocedure Evaluation (Addendum)
Anesthesia Evaluation  Patient identified by MRN, date of birth, ID band Patient awake    Reviewed: Allergy & Precautions, NPO status , Patient's Chart, lab work & pertinent test results  History of Anesthesia Complications Negative for: history of anesthetic complications  Airway Mallampati: II  TM Distance: >3 FB Neck ROM: Full    Dental  (+) Poor Dentition, Loose, Missing, Dental Advisory Given   Pulmonary COPD,  COPD inhaler, Current SmokerPatient did not abstain from smoking.,  06/11/2019 SARS coronavirus neg   breath sounds clear to auscultation       Cardiovascular hypertension (no meds presently), + Peripheral Vascular Disease and + DVT   Rhythm:Regular Rate:Normal  '19 ECHO: EF 45-50%   Neuro/Psych Anxiety Depression Diabetic neuropathy    GI/Hepatic GERD  Medicated,(+)     substance abuse  marijuana use, N/V with current urosepsis   Endo/Other  diabetes (glu 113), Insulin DependentMorbid obesity  Renal/GU ARFRenal disease (K+ 3.4, creat 2.34)Stones/urosepsis     Musculoskeletal   Abdominal (+) + obese,   Peds  Hematology Coumadin: INR 3.3   Anesthesia Other Findings   Reproductive/Obstetrics LMP ended yesterday                            Anesthesia Physical Anesthesia Plan  ASA: III  Anesthesia Plan: General   Post-op Pain Management:    Induction: Intravenous, Rapid sequence and Cricoid pressure planned  PONV Risk Score and Plan: 2 and Ondansetron and Treatment may vary due to age or medical condition  Airway Management Planned: Oral ETT and Video Laryngoscope Planned  Additional Equipment:   Intra-op Plan:   Post-operative Plan: Extubation in OR  Informed Consent: I have reviewed the patients History and Physical, chart, labs and discussed the procedure including the risks, benefits and alternatives for the proposed anesthesia with the patient or authorized  representative who has indicated his/her understanding and acceptance.     Dental advisory given  Plan Discussed with: CRNA and Surgeon  Anesthesia Plan Comments:        Anesthesia Quick Evaluation

## 2019-06-11 NOTE — Transfer of Care (Signed)
Immediate Anesthesia Transfer of Care Note  Patient: Terri Sparks  Procedure(s) Performed: CYSTOSCOPY,RIGHT RETROGRADE WITH STENT PLACEMENT (Right Ureter)  Patient Location: PACU  Anesthesia Type:General  Level of Consciousness: awake, alert  and oriented  Airway & Oxygen Therapy: Patient Spontanous Breathing and Patient connected to face mask oxygen  Post-op Assessment: Report given to RN and Post -op Vital signs reviewed and stable  Post vital signs: Reviewed and stable  Last Vitals:  Vitals Value Taken Time  BP    Temp    Pulse    Resp    SpO2      Last Pain:  Vitals:   06/11/19 1640  TempSrc:   PainSc: 7          Complications: No apparent anesthesia complications

## 2019-06-12 ENCOUNTER — Encounter (HOSPITAL_COMMUNITY): Payer: Self-pay | Admitting: Urology

## 2019-06-12 DIAGNOSIS — D500 Iron deficiency anemia secondary to blood loss (chronic): Secondary | ICD-10-CM | POA: Diagnosis present

## 2019-06-12 DIAGNOSIS — I749 Embolism and thrombosis of unspecified artery: Secondary | ICD-10-CM

## 2019-06-12 DIAGNOSIS — R9431 Abnormal electrocardiogram [ECG] [EKG]: Secondary | ICD-10-CM

## 2019-06-12 DIAGNOSIS — N390 Urinary tract infection, site not specified: Secondary | ICD-10-CM

## 2019-06-12 DIAGNOSIS — Z720 Tobacco use: Secondary | ICD-10-CM

## 2019-06-12 DIAGNOSIS — E1122 Type 2 diabetes mellitus with diabetic chronic kidney disease: Secondary | ICD-10-CM

## 2019-06-12 DIAGNOSIS — K5900 Constipation, unspecified: Secondary | ICD-10-CM | POA: Diagnosis present

## 2019-06-12 DIAGNOSIS — N10 Acute tubulo-interstitial nephritis: Secondary | ICD-10-CM | POA: Diagnosis present

## 2019-06-12 DIAGNOSIS — N201 Calculus of ureter: Secondary | ICD-10-CM

## 2019-06-12 DIAGNOSIS — N183 Chronic kidney disease, stage 3 (moderate): Secondary | ICD-10-CM

## 2019-06-12 DIAGNOSIS — A419 Sepsis, unspecified organism: Secondary | ICD-10-CM | POA: Diagnosis present

## 2019-06-12 DIAGNOSIS — E538 Deficiency of other specified B group vitamins: Secondary | ICD-10-CM | POA: Diagnosis present

## 2019-06-12 DIAGNOSIS — N179 Acute kidney failure, unspecified: Secondary | ICD-10-CM

## 2019-06-12 DIAGNOSIS — I739 Peripheral vascular disease, unspecified: Secondary | ICD-10-CM

## 2019-06-12 LAB — FERRITIN: Ferritin: 57 ng/mL (ref 11–307)

## 2019-06-12 LAB — CBC
HCT: 32.8 % — ABNORMAL LOW (ref 36.0–46.0)
Hemoglobin: 10.3 g/dL — ABNORMAL LOW (ref 12.0–15.0)
MCH: 28.9 pg (ref 26.0–34.0)
MCHC: 31.4 g/dL (ref 30.0–36.0)
MCV: 91.9 fL (ref 80.0–100.0)
Platelets: 336 10*3/uL (ref 150–400)
RBC: 3.57 MIL/uL — ABNORMAL LOW (ref 3.87–5.11)
RDW: 16.7 % — ABNORMAL HIGH (ref 11.5–15.5)
WBC: 16.4 10*3/uL — ABNORMAL HIGH (ref 4.0–10.5)
nRBC: 0 % (ref 0.0–0.2)

## 2019-06-12 LAB — IRON AND TIBC
Iron: 12 ug/dL — ABNORMAL LOW (ref 28–170)
Saturation Ratios: 3 % — ABNORMAL LOW (ref 10.4–31.8)
TIBC: 346 ug/dL (ref 250–450)
UIBC: 334 ug/dL

## 2019-06-12 LAB — BASIC METABOLIC PANEL
Anion gap: 5 (ref 5–15)
BUN: 20 mg/dL (ref 6–20)
CO2: 23 mmol/L (ref 22–32)
Calcium: 8.6 mg/dL — ABNORMAL LOW (ref 8.9–10.3)
Chloride: 105 mmol/L (ref 98–111)
Creatinine, Ser: 2.16 mg/dL — ABNORMAL HIGH (ref 0.44–1.00)
GFR calc Af Amer: 31 mL/min — ABNORMAL LOW (ref >60)
GFR calc non Af Amer: 27 mL/min — ABNORMAL LOW (ref >60)
Glucose, Bld: 110 mg/dL — ABNORMAL HIGH (ref 70–99)
Potassium: 4.3 mmol/L (ref 3.5–5.1)
Sodium: 133 mmol/L — ABNORMAL LOW (ref 135–145)

## 2019-06-12 LAB — GLUCOSE, CAPILLARY
Glucose-Capillary: 115 mg/dL — ABNORMAL HIGH (ref 70–99)
Glucose-Capillary: 84 mg/dL (ref 70–99)
Glucose-Capillary: 121 mg/dL — ABNORMAL HIGH (ref 70–99)
Glucose-Capillary: 132 mg/dL — ABNORMAL HIGH (ref 70–99)
Glucose-Capillary: 146 mg/dL — ABNORMAL HIGH (ref 70–99)

## 2019-06-12 LAB — PROTIME-INR
INR: 2.9 — ABNORMAL HIGH (ref 0.8–1.2)
Prothrombin Time: 29.5 seconds — ABNORMAL HIGH (ref 11.4–15.2)

## 2019-06-12 LAB — HEMOGLOBIN A1C
Hgb A1c MFr Bld: 8.6 % — ABNORMAL HIGH (ref 4.8–5.6)
Mean Plasma Glucose: 200 mg/dL

## 2019-06-12 LAB — VITAMIN B12: Vitamin B-12: 129 pg/mL — ABNORMAL LOW (ref 180–914)

## 2019-06-12 LAB — MAGNESIUM: Magnesium: 1.8 mg/dL (ref 1.7–2.4)

## 2019-06-12 LAB — FOLATE: Folate: 5.9 ng/mL — ABNORMAL LOW (ref >5.9)

## 2019-06-12 LAB — APTT: aPTT: 77 seconds — ABNORMAL HIGH (ref 24–36)

## 2019-06-12 MED ORDER — SERTRALINE HCL 50 MG PO TABS
50.0000 mg | ORAL_TABLET | Freq: Every day | ORAL | Status: DC
  Administered 2019-06-12 – 2019-06-16 (×5): 50 mg via ORAL
  Filled 2019-06-12 (×5): qty 1

## 2019-06-12 MED ORDER — FOLIC ACID 1 MG PO TABS
1.0000 mg | ORAL_TABLET | Freq: Every day | ORAL | Status: DC
  Administered 2019-06-12 – 2019-06-16 (×5): 1 mg via ORAL
  Filled 2019-06-12 (×5): qty 1

## 2019-06-12 MED ORDER — FERROUS SULFATE 325 (65 FE) MG PO TABS
325.0000 mg | ORAL_TABLET | Freq: Every day | ORAL | Status: DC
  Administered 2019-06-12 – 2019-06-13 (×2): 325 mg via ORAL
  Filled 2019-06-12 (×2): qty 1

## 2019-06-12 MED ORDER — SODIUM CHLORIDE 0.9 % IV SOLN
INTRAVENOUS | Status: AC
  Administered 2019-06-12 – 2019-06-13 (×5): via INTRAVENOUS

## 2019-06-12 MED ORDER — PROCHLORPERAZINE EDISYLATE 10 MG/2ML IJ SOLN
10.0000 mg | Freq: Four times a day (QID) | INTRAMUSCULAR | Status: DC | PRN
  Administered 2019-06-12 – 2019-06-14 (×5): 10 mg via INTRAVENOUS
  Filled 2019-06-12 (×5): qty 2

## 2019-06-12 MED ORDER — WARFARIN SODIUM 5 MG PO TABS
5.0000 mg | ORAL_TABLET | Freq: Once | ORAL | Status: AC
  Administered 2019-06-12: 5 mg via ORAL
  Filled 2019-06-12: qty 1

## 2019-06-12 MED ORDER — SORBITOL 70 % SOLN
30.0000 mL | Freq: Once | Status: AC
  Administered 2019-06-12: 30 mL via ORAL
  Filled 2019-06-12: qty 30

## 2019-06-12 MED ORDER — WARFARIN - PHARMACIST DOSING INPATIENT: Freq: Every day | Status: DC

## 2019-06-12 MED ORDER — MAGNESIUM SULFATE 4 GM/100ML IV SOLN
4.0000 g | Freq: Once | INTRAVENOUS | Status: AC
  Administered 2019-06-12: 4 g via INTRAVENOUS
  Filled 2019-06-12: qty 100

## 2019-06-12 MED ORDER — CYANOCOBALAMIN 1000 MCG/ML IJ SOLN
1000.0000 ug | Freq: Every day | INTRAMUSCULAR | Status: DC
  Administered 2019-06-12 – 2019-06-16 (×5): 1000 ug via SUBCUTANEOUS
  Filled 2019-06-12 (×5): qty 1

## 2019-06-12 MED ORDER — ACETAMINOPHEN 325 MG PO TABS
650.0000 mg | ORAL_TABLET | ORAL | Status: DC | PRN
  Administered 2019-06-12 – 2019-06-16 (×2): 650 mg via ORAL
  Filled 2019-06-12 (×2): qty 2

## 2019-06-12 MED ORDER — OXYCODONE-ACETAMINOPHEN 5-325 MG PO TABS
1.0000 | ORAL_TABLET | ORAL | Status: DC | PRN
  Administered 2019-06-12: 1 via ORAL
  Administered 2019-06-12 – 2019-06-16 (×18): 2 via ORAL
  Filled 2019-06-12 (×10): qty 2
  Filled 2019-06-12: qty 1
  Filled 2019-06-12 (×8): qty 2

## 2019-06-12 MED ORDER — PANTOPRAZOLE SODIUM 40 MG PO TBEC
40.0000 mg | DELAYED_RELEASE_TABLET | Freq: Every day | ORAL | Status: DC
  Administered 2019-06-12 – 2019-06-16 (×5): 40 mg via ORAL
  Filled 2019-06-12 (×5): qty 1

## 2019-06-12 MED ORDER — ZOLPIDEM TARTRATE 5 MG PO TABS
5.0000 mg | ORAL_TABLET | Freq: Every evening | ORAL | Status: DC | PRN
  Administered 2019-06-12 – 2019-06-15 (×2): 5 mg via ORAL
  Filled 2019-06-12 (×4): qty 1

## 2019-06-12 MED ORDER — POLYETHYLENE GLYCOL 3350 17 G PO PACK
17.0000 g | PACK | Freq: Two times a day (BID) | ORAL | Status: DC
  Administered 2019-06-12 – 2019-06-16 (×9): 17 g via ORAL
  Filled 2019-06-12 (×9): qty 1

## 2019-06-12 MED ORDER — SODIUM CHLORIDE 0.9 % IV SOLN
2.0000 g | INTRAVENOUS | Status: DC
  Administered 2019-06-12 – 2019-06-15 (×4): 2 g via INTRAVENOUS
  Filled 2019-06-12: qty 20
  Filled 2019-06-12 (×2): qty 2
  Filled 2019-06-12: qty 20
  Filled 2019-06-12: qty 2

## 2019-06-12 MED ORDER — SENNOSIDES-DOCUSATE SODIUM 8.6-50 MG PO TABS
1.0000 | ORAL_TABLET | Freq: Two times a day (BID) | ORAL | Status: DC
  Administered 2019-06-12 – 2019-06-16 (×8): 1 via ORAL
  Filled 2019-06-12 (×8): qty 1

## 2019-06-12 MED ORDER — PROCHLORPERAZINE EDISYLATE 10 MG/2ML IJ SOLN
5.0000 mg | Freq: Once | INTRAMUSCULAR | Status: AC
  Administered 2019-06-12: 5 mg via INTRAVENOUS
  Filled 2019-06-12: qty 2

## 2019-06-12 NOTE — Progress Notes (Signed)
Pt states that since having her procedure in June and undergoing anesthesia she has been having trouble with moving her bowls. She states that she has to " push down on my stomach to use the bathroom".

## 2019-06-12 NOTE — Progress Notes (Signed)
RN went to patient's bedside to give Soap suds enema that was ordered by MD. Patient stated that she did not need the enema that she had 2 large BMs. RN emptied about 350cc of loose stool with solid pieces. Patient stated that she had one other BM that was at least that same amount, but that she had went in the toilet. RN notified MD of BM results and verbal order given to not give enema at this time.   Kenyatte Gruber, Fraser Din 06/12/2019 5:19 PM

## 2019-06-12 NOTE — Progress Notes (Signed)
PROGRESS NOTE    Terri Sparks  [id number] DOB: 1976/06/03 DOA: 06/11/2019 PCP: Ladell Pier, MD   Brief Narrative:  Patient is a 43 year old female history of insulin-dependent diabetes mellitus type 2, anxiety, depression, left lower extremity DVT, history of hyperparathyroidism/hypercalcemia, hypertension, chronic kidney disease stage III, PVD/stent/PCI 2018, prolonged QT and recurrent kidney stones presented with worsening right flank pain.  Patient also noted with nausea and emesis with decreased oral intake.  Patient with complaints of constipation.  CT renal stone protocol which was done showed right hydronephrosis, hydroureter and significant perinephric edema secondary to 5 mm mid to distal right ureteral calculus and bilateral nonobstructing renal calculi and chronic foci of gas within both kidneys.  Urology was consulted and patient subsequently underwent urgent cystoscopy and right double-J stent insertion per urology on 06/11/2019.  Urine cultures obtained and pending.  Patient placed empirically on IV Rocephin.   Assessment & Plan:   Principal Problem:   Sepsis ([place]) Active Problems:   Acute pyelonephritis   GERD (gastroesophageal reflux disease)   Peripheral vascular disease of lower extremity (HCC)   Tobacco abuse   Diabetes mellitus, type II (HCC)   Anxiety and depression   Thromboembolism (HCC)   Right ureteral stone   Essential hypertension   Supratherapeutic INR   Hydronephrosis with obstructing calculus   Iron deficiency anemia due to chronic blood loss   Vitamin B12 deficiency   Folate deficiency   Constipation  #1 sepsis secondary to right pyelonephritis/right UTI/right hydronephrosis/right hydroureter right mid to distal ureteral stone with sepsis Patient status post cystoscopy with right retrograde pyelogram and interpretation with insertion of right double-J stent per Dr. [name] 06/11/2019.  Urine cultures pending.  Cultures were also obtained during  procedure and pending.  Patient currently afebrile.  Leukocytosis trending down.  Continue empiric IV Rocephin.  IV fluids.  Supportive care.  Urology following.  2.  Insulin-dependent diabetes mellitus type 2 with hyperglycemia Hemoglobin A1c of 8.6.  CBG of 84 this morning.  Patient placed on half home dose Lantus.  Continue sliding scale insulin.  Follow.  3.  Lower extremity DVT On Coumadin.  INR was supratherapeutic at 3.3.  Coumadin was held.  INR this morning is 2.9.  Coumadin per pharmacy.  4.  PVD/stent/PCI 2018 Continue aspirin.  Patient likely needs to be on a statin which will defer till discharge.  Coumadin per pharmacy.  Outpatient follow-up.  5.  Prolonged QTC QT interval of 513.  Repeat EKG.  Keep potassium greater than 4.  Keep magnesium greater than 2.  Hold QTc prolonging gating medications for now.  6.  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  7.  Hypertension We will follow.  8.  Constipation Placed on MiraLAX twice daily.  Senokot-S twice daily.  Sorbitol p.o. x1.  Soapsuds enema.  If no results will need a smog enema.  9.  Hypokalemia Likely secondary to GI losses.  Repleted.  10.  Iron deficiency anemia/vitamin B12 deficiency/folate deficiency Patient with no overt GI bleed.  Hemoglobin at 10.3 from 12.1 on admission.  Likely dilutional.  Patient's hemoglobin was 8.5 on 02/22/2019.  Patient stated just had her menses.  Anemia panel obtained with a iron level of 12, TIBC of 346, ferritin of 57, folate of 5.9, vitamin B12 of 129.  Patient on oral iron supplementation which will increase to twice daily.  Placed on folic acid.  Vitamin B12 1000 MCG's subcutaneous daily x1 week, and then  weekly x1 month, and then monthly.  Transfusion threshold hemoglobin less than 7.  Outpatient follow-up with PCP.  11.  Depression/anxiety Resume home regimen Zoloft.  We will hold off on Wellbutrin as patient was stating that that was  being used for tobacco cessation.  Outpatient follow-up.  12.  Tobacco abuse Tobacco cessation stressed to patient.  Continue nicotine patch.  13.  Morbid obesity BMI 41.43.  Lifestyle changes encouraged.     DVT prophylaxis: INR therapeutic.  Coumadin. Code Status: Full Family Communication: Updated patient.  No family at bedside. Disposition Plan: Likely home when clinically improved, urine cultures have resulted, and when okay with urology.   Consultants:   Urology: Dr. [name] 06/11/2019  Procedures:   CT renal stone protocol 06/11/2019  Cystoscopy with right retrograde pyelogram and interpretation/cystoscopy with insertion of right double-J stent per Dr. [name] 06/11/2019  Antimicrobials:   IV Rocephin 06/11/2019   Subjective: Patient with complaints of constipation.  Patient states some improvement with right flank pain.  However still with some pain.  Denies any chest pain or shortness of breath.  Objective: Vitals:   06/11/19 2045 06/11/19 2300 06/12/19 0526 06/12/19 1305  BP: 102/90  115/71 127/76  Pulse: 95  87 84  Resp: 20  20   Temp: 98.4 F (36.9 C)  98.9 F (37.2 C) 97.7 F (36.5 C)  TempSrc: Oral  Oral Oral  SpO2: 94%  98% 93%  Weight:  120 kg    Height:  5\' 7"  (1.702 m)      Intake/Output Summary (Last 24 hours) at 06/12/2019 1544 Last data filed at 06/12/2019 1123 Gross per 24 hour  Intake 3196.88 ml  Output 2975 ml  Net 221.88 ml   Filed Weights   06/11/19 1215 06/11/19 2300  Weight: 120 kg 120 kg    Examination:  General exam: Appears calm and comfortable  Respiratory system: Clear to auscultation. Respiratory effort normal. Cardiovascular system: S1 & S2 heard, RRR. No JVD, murmurs, rubs, gallops or clicks. No pedal edema. Gastrointestinal system: Abdomen is nondistended, soft and nontender. No organomegaly or masses felt. Normal bowel sounds heard. Central nervous system: Alert and oriented. No focal neurological deficits. Extremities:  Symmetric 5 x 5 power. Skin: No rashes, lesions or ulcers Psychiatry: Judgement and insight appear normal. Mood & affect appropriate.     Data Reviewed: I have personally reviewed following labs and imaging studies  CBC: Recent Labs  Lab 06/11/19 1245 06/12/19 0426  WBC 22.3* 16.4*  NEUTROABS 19.7*  --   HGB 12.1 10.3*  HCT 37.6 32.8*  MCV 88.9 91.9  PLT 369 [id number]   Basic Metabolic Panel: Recent Labs  Lab 06/11/19 1245 06/11/19 1659 06/12/19 0426  NA 134*  --  133*  K 3.4*  --  4.3  CL 106  --  105  CO2 20*  --  23  GLUCOSE 191*  --  110*  BUN 20  --  20  CREATININE 2.34*  --  2.16*  CALCIUM 8.8*  --  8.6*  MG  --  1.7 1.8   GFR: Estimated Creatinine Clearance: 44.6 mL/min (A) (by C-G formula based on SCr of 2.16 mg/dL (H)). Liver Function Tests: No results for input(s): AST, ALT, ALKPHOS, BILITOT, PROT, ALBUMIN in the last 168 hours. No results for input(s): LIPASE, AMYLASE in the last 168 hours. No results for input(s): AMMONIA in the last 168 hours. Coagulation Profile: Recent Labs  Lab 06/11/19 1305 06/12/19 0426  INR 3.3* 2.9*  Cardiac Enzymes: No results for input(s): CKTOTAL, CKMB, CKMBINDEX, TROPONINI in the last 168 hours. BNP (last 3 results) No results for input(s): PROBNP in the last 8760 hours. HbA1C: Recent Labs    06/11/19 1836  HGBA1C 8.6*   CBG: Recent Labs  Lab 06/11/19 1724 06/11/19 1820 06/11/19 2138 06/12/19 0720 06/12/19 1148  GLUCAP 115* 108* 91 84 121*   Lipid Profile: No results for input(s): CHOL, HDL, LDLCALC, TRIG, CHOLHDL, LDLDIRECT in the last 72 hours. Thyroid Function Tests: No results for input(s): TSH, T4TOTAL, FREET4, T3FREE, THYROIDAB in the last 72 hours. Anemia Panel: Recent Labs    06/12/19 0854  VITAMINB12 129*  FOLATE 5.9*  FERRITIN 57  TIBC 346  IRON 12*   Sepsis Labs: No results for input(s): PROCALCITON, LATICACIDVEN in the last 168 hours.  Recent Results (from the past 240 hour(s))  SARS  Coronavirus 2 [facility] order, Performed in [facility] hospital lab) Nasopharyngeal Nasopharyngeal Swab     Status: None   Collection Time: 06/11/19  3:06 PM   Specimen: Nasopharyngeal Swab  Result Value Ref Range Status   SARS Coronavirus 2 NEGATIVE NEGATIVE Final    Comment: (NOTE) If result is NEGATIVE SARS-CoV-2 target nucleic acids are NOT DETECTED. The SARS-CoV-2 RNA is generally detectable in upper and lower  respiratory specimens during the acute phase of infection. The lowest  concentration of SARS-CoV-2 viral copies this assay can detect is 250  copies / mL. A negative result does not preclude SARS-CoV-2 infection  and should not be used as the sole basis for treatment or other  patient management decisions.  A negative result may occur with  improper specimen collection / handling, submission of specimen other  than nasopharyngeal swab, presence of viral mutation(s) within the  areas targeted by this assay, and inadequate number of viral copies  (<250 copies / mL). A negative result must be combined with clinical  observations, patient history, and epidemiological information. If result is POSITIVE SARS-CoV-2 target nucleic acids are DETECTED. The SARS-CoV-2 RNA is generally detectable in upper and lower  respiratory specimens dur ing the acute phase of infection.  Positive  results are indicative of active infection with SARS-CoV-2.  Clinical  correlation with patient history and other diagnostic information is  necessary to determine patient infection status.  Positive results do  not rule out bacterial infection or co-infection with other viruses. If result is PRESUMPTIVE POSTIVE SARS-CoV-2 nucleic acids MAY BE PRESENT.   A presumptive positive result was obtained on the submitted specimen  and confirmed on repeat testing.  While 2019 novel coronavirus  (SARS-CoV-2) nucleic acids may be present in the submitted sample  additional confirmatory testing may be necessary  for epidemiological  and / or clinical management purposes  to differentiate between  SARS-CoV-2 and other Sarbecovirus currently known to infect humans.  If clinically indicated additional testing with an alternate test  methodology [id number]) is advised. The SARS-CoV-2 RNA is generally  detectable in upper and lower respiratory sp ecimens during the acute  phase of infection. The expected result is Negative. Fact Sheet for Patients:  StrictlyIdeas.no Fact Sheet for Healthcare Providers: BankingDealers.co.za This test is not yet approved or cleared by the [place] FDA and has been authorized for detection and/or diagnosis of SARS-CoV-2 by FDA under an Emergency Use Authorization (EUA).  This EUA will remain in effect (meaning this test can be used) for the duration of the COVID-19 declaration under Section 564(b)(1) of the Act, 21 U.S.C. section 360bbb-3(b)(1), unless  the authorization is terminated or revoked sooner. Performed at [facility], [place] [address], [place], [place] [address]          Radiology Studies: Dg C-arm 1-60 Min-no Report  Result Date: 06/11/2019 Fluoroscopy was utilized by the requesting physician.  No radiographic interpretation.   Ct Renal Stone Study  Result Date: 06/11/2019 CLINICAL DATA:  RIGHT flank pain for 3 days, some hematuria per patient, suspected recurrent stone disease; history of kidney stones, diabetes mellitus, hypertension, multiple stone extractions and lithotripsy EXAM: CT ABDOMEN AND PELVIS WITHOUT CONTRAST TECHNIQUE: Multidetector CT imaging of the abdomen and pelvis was performed following the standard protocol without IV contrast. Sagittal and coronal MPR images reconstructed from axial data set. No oral contrast administered for this indication COMPARISON:  02/07/2019 FINDINGS: Lower chest: Lung bases clear Hepatobiliary: Gallbladder and liver normal appearance  Pancreas: Normal appearance Spleen: Normal appearance Adrenals/Urinary Tract: RIGHT adrenal gland normal appearance. LEFT adrenal nodule 2.1 x 1.9 cm, low attenuation consistent with adenoma. Atrophic LEFT kidney with multiple calcifications. Foci of gas are present within LEFT kidney as well, present on prior study as well. No perinephric stranding or hydronephrosis. No definite renal mass. Small amount of gas within urinary bladder question catheterization. Significant RIGHT hydronephrosis and hydroureter secondary to a 5 mm mid to distal RIGHT ureteral calculus image 72. Additional small nonobstructing RIGHT renal calculi. Single focus of air is seen within the collecting system at the upper pole the RIGHT kidney, decreased versus prior study. Significant perinephric stranding of RIGHT kidney with fluid/edema extending into anterior pararenal fascia, posterior pararenal fascia and lateral conal fascia. Remaining ureters and bladder otherwise unremarkable. Stomach/Bowel: Normal appendix. Stomach and bowel loops normal appearance. Vascular/Lymphatic: Atherosclerotic calcifications aorta and iliac arteries. Wall stent LEFT common iliac artery. Aorta normal caliber. Scattered normal size lymph nodes. Reproductive: Normal appearing uterus and adnexa/ovaries. Other: No free air or free fluid. Musculoskeletal: No acute osseous findings. IMPRESSION: RIGHT hydronephrosis, hydroureter and significant perinephric edema secondary to a 5 mm mid to distal RIGHT ureteral calculus. Additional BILATERAL non-obstructing renal calculi. Foci of gas within both kidneys, stable on LEFT and decreased on RIGHT since prior study, question due to catheterization or prior instrumentation. LEFT adrenal adenoma. Electronically Signed   By: Lavonia Dana M.D.   On: 06/11/2019 14:05        Scheduled Meds:  cyanocobalamin  1,000 mcg Subcutaneous Daily   ferrous sulfate  325 mg Oral Q breakfast   folic acid  1 mg Oral Daily    insulin aspart  0-15 Units Subcutaneous TID WC   insulin aspart  0-5 Units Subcutaneous QHS   insulin glargine  30 Units Subcutaneous QHS   nicotine  14 mg Transdermal Daily   pantoprazole  40 mg Oral Q0600   polyethylene glycol  17 g Oral BID   senna-docusate  1 tablet Oral BID   sertraline  50 mg Oral Daily   Continuous Infusions:  sodium chloride 100 mL/hr at 06/12/19 1052   cefTRIAXone (ROCEPHIN)  IV       LOS: 1 day    Time spent: 40 minutes    Irine Seal, MD [facility]  If 7PM-7AM, please contact night-coverage [email or web address] 06/12/2019, 3:44 PM

## 2019-06-12 NOTE — Plan of Care (Signed)
Plan of care reviewed and discussed with the patient. 

## 2019-06-12 NOTE — Progress Notes (Signed)
ANTICOAGULATION CONSULT NOTE - Initial Consult  Pharmacy Consult for warfarin Indication: hx VTE  Allergies  Allergen Reactions  . Ciprofloxacin Hcl Hives  . Macrobid [Nitrofurantoin Macrocrystal] Hives, Shortness Of Breath and Rash  . Other Hives, Shortness Of Breath and Rash    NO "-CILLINS"!!!  . Penicillins Shortness Of Breath    Had had cephalosporins without incident Has patient had a PCN reaction causing immediate rash, facial/tongue/throat swelling, SOB or lightheadedness with hypotension: Yes Has patient had a PCN reaction causing severe rash involving mucus membranes or skin necrosis: Unk Has patient had a PCN reaction that required hospitalization: Unk Has patient had a PCN reaction occurring within the last 10 years: No If all of the above answers are "NO", then may proceed with Cephalosporin use.   . Sulfa Antibiotics Hives, Shortness Of Breath and Rash  . Dilaudid [Hydromorphone Hcl] Other (See Comments)    Asystole per pt report  . Lexapro [Escitalopram Oxalate] Other (See Comments)    "I just did not like it."    Patient Measurements: Height: 5\' 7"  (170.2 cm) Weight: 264 lb 8.8 oz (120 kg) IBW/kg (Calculated) : 61.6  Vital Signs: Temp: 97.7 F (36.5 C) (09/08 1305) Temp Source: Oral (09/08 1305) BP: 127/76 (09/08 1305) Pulse Rate: 84 (09/08 1305)  Labs: Recent Labs    06/11/19 1245 06/11/19 1305 06/12/19 0426  HGB 12.1  --  10.3*  HCT 37.6  --  32.8*  PLT 369  --  336  APTT  --   --  77*  LABPROT  --  33.1* 29.5*  INR  --  3.3* 2.9*  CREATININE 2.34*  --  2.16*    Estimated Creatinine Clearance: 44.6 mL/min (A) (by C-G formula based on SCr of 2.16 mg/dL (H)).   Medical History: Past Medical History:  Diagnosis Date  . Anxiety   . Depression   . Diabetes ([place])    Type 2  . GERD (gastroesophageal reflux disease)   . History of blood clots    ,DVT-left leg, and early 2000, had blood clot behind left breast  . History of kidney stones   .  Hypercalcemia   . Hyperparathyroidism   . Hypertension    had been on Lisinopril and PCP took her off it 4 months ago  . Iron deficiency anemia   . Left thyroid nodule   . Mass of right ovary   . Nephrolithiasis   . PAOD (peripheral arterial occlusive disease) ([place])   . Peripheral vascular disease ([place])   . Prolonged Q-T interval on ECG 04/19/2019  . Renal calculi   . Tooth loose    top tooth from the right  is loose    Assessment: Patient taking warfarin PTA for history of LLE DVT. Pharmacy consulted to resume warfarin dosing today and monitor while inpatient.   PTA dose (confirmed from clinic notes on 05/09/19):  Warfarin 10 mg PO on Tues, Thurs, Sat Warfarin 5 mg PO on all other days of the week  INR on admission = 3.3  Significant Events: -9/7: Cystoscopy with insertion of right double J stent. Warfarin held  Today, 06/12/19  Hgb 10.3 - slightly decreased, likely dilutional per MD notes  Plt 336 - WNL  INR 2.9 is therapeutic  Goal of Therapy:  INR 2-3   Plan:   Warfarin 5 mg PO once this evening  INR daily  CBC with AM labs tomorrow  Lenis Noon, PharmD 06/12/2019,3:45 PM

## 2019-06-13 LAB — CBC WITH DIFFERENTIAL/PLATELET
Abs Immature Granulocytes: 0.04 10*3/uL (ref 0.00–0.07)
Basophils Absolute: 0.1 10*3/uL (ref 0.0–0.1)
Basophils Relative: 1 %
Eosinophils Absolute: 0.5 10*3/uL (ref 0.0–0.5)
Eosinophils Relative: 5 %
HCT: 32.4 % — ABNORMAL LOW (ref 36.0–46.0)
Hemoglobin: 9.9 g/dL — ABNORMAL LOW (ref 12.0–15.0)
Immature Granulocytes: 0 %
Lymphocytes Relative: 24 %
Lymphs Abs: 2.3 10*3/uL (ref 0.7–4.0)
MCH: 28.9 pg (ref 26.0–34.0)
MCHC: 30.6 g/dL (ref 30.0–36.0)
MCV: 94.5 fL (ref 80.0–100.0)
Monocytes Absolute: 0.6 10*3/uL (ref 0.1–1.0)
Monocytes Relative: 7 %
Neutro Abs: 6 10*3/uL (ref 1.7–7.7)
Neutrophils Relative %: 63 %
Platelets: 307 10*3/uL (ref 150–400)
RBC: 3.43 MIL/uL — ABNORMAL LOW (ref 3.87–5.11)
RDW: 16.8 % — ABNORMAL HIGH (ref 11.5–15.5)
WBC: 9.5 10*3/uL (ref 4.0–10.5)
nRBC: 0 % (ref 0.0–0.2)

## 2019-06-13 LAB — GLUCOSE, CAPILLARY
Glucose-Capillary: 129 mg/dL — ABNORMAL HIGH (ref 70–99)
Glucose-Capillary: 133 mg/dL — ABNORMAL HIGH (ref 70–99)
Glucose-Capillary: 126 mg/dL — ABNORMAL HIGH (ref 70–99)

## 2019-06-13 LAB — MAGNESIUM: Magnesium: 2.5 mg/dL — ABNORMAL HIGH (ref 1.7–2.4)

## 2019-06-13 LAB — PROTIME-INR
INR: 2 — ABNORMAL HIGH (ref 0.8–1.2)
Prothrombin Time: 22.4 seconds — ABNORMAL HIGH (ref 11.4–15.2)

## 2019-06-13 LAB — BASIC METABOLIC PANEL
Anion gap: 5 (ref 5–15)
BUN: 21 mg/dL — ABNORMAL HIGH (ref 6–20)
CO2: 23 mmol/L (ref 22–32)
Calcium: 8.7 mg/dL — ABNORMAL LOW (ref 8.9–10.3)
Chloride: 107 mmol/L (ref 98–111)
Creatinine, Ser: 2.22 mg/dL — ABNORMAL HIGH (ref 0.44–1.00)
GFR calc Af Amer: 30 mL/min — ABNORMAL LOW (ref >60)
GFR calc non Af Amer: 26 mL/min — ABNORMAL LOW (ref >60)
Glucose, Bld: 153 mg/dL — ABNORMAL HIGH (ref 70–99)
Potassium: 4.1 mmol/L (ref 3.5–5.1)
Sodium: 135 mmol/L (ref 135–145)

## 2019-06-13 MED ORDER — HYDROXYZINE HCL 25 MG PO TABS
25.0000 mg | ORAL_TABLET | Freq: Three times a day (TID) | ORAL | Status: DC | PRN
  Administered 2019-06-13: 25 mg via ORAL
  Filled 2019-06-13: qty 1

## 2019-06-13 MED ORDER — ALBUTEROL SULFATE (2.5 MG/3ML) 0.083% IN NEBU: 2.5000 mg | INHALATION_SOLUTION | Freq: Four times a day (QID) | RESPIRATORY_TRACT | Status: DC | PRN

## 2019-06-13 MED ORDER — WARFARIN SODIUM 5 MG PO TABS
10.0000 mg | ORAL_TABLET | Freq: Once | ORAL | Status: AC
  Administered 2019-06-13: 10 mg via ORAL
  Filled 2019-06-13: qty 2

## 2019-06-13 MED ORDER — HYDROXYZINE HCL 25 MG PO TABS
50.0000 mg | ORAL_TABLET | Freq: Three times a day (TID) | ORAL | Status: DC | PRN
  Administered 2019-06-13: 18:00:00 50 mg via ORAL
  Filled 2019-06-13: qty 2

## 2019-06-13 MED ORDER — LORAZEPAM 0.5 MG PO TABS
0.5000 mg | ORAL_TABLET | Freq: Once | ORAL | Status: AC
  Administered 2019-06-13: 0.5 mg via ORAL
  Filled 2019-06-13: qty 1

## 2019-06-13 MED ORDER — HYDROXYZINE HCL 25 MG PO TABS
25.0000 mg | ORAL_TABLET | Freq: Three times a day (TID) | ORAL | Status: DC | PRN
  Administered 2019-06-14 – 2019-06-15 (×3): 25 mg via ORAL
  Filled 2019-06-13 (×5): qty 1

## 2019-06-13 MED ORDER — FERROUS SULFATE 325 (65 FE) MG PO TABS
325.0000 mg | ORAL_TABLET | Freq: Two times a day (BID) | ORAL | Status: DC
  Administered 2019-06-13 – 2019-06-16 (×6): 325 mg via ORAL
  Filled 2019-06-13 (×6): qty 1

## 2019-06-13 MED ORDER — HYDROXYZINE HCL 25 MG PO TABS
25.0000 mg | ORAL_TABLET | Freq: Once | ORAL | Status: AC
  Administered 2019-06-13: 25 mg via ORAL
  Filled 2019-06-13: qty 1

## 2019-06-13 MED ORDER — ALBUTEROL SULFATE HFA 108 (90 BASE) MCG/ACT IN AERS: 1.0000 | INHALATION_SPRAY | Freq: Four times a day (QID) | RESPIRATORY_TRACT | Status: DC | PRN

## 2019-06-13 MED ORDER — AMLODIPINE BESYLATE 5 MG PO TABS
5.0000 mg | ORAL_TABLET | Freq: Every day | ORAL | Status: DC
  Administered 2019-06-13 – 2019-06-16 (×4): 5 mg via ORAL
  Filled 2019-06-13 (×4): qty 1

## 2019-06-13 NOTE — Progress Notes (Signed)
ANTICOAGULATION CONSULT NOTE - Initial Consult  Pharmacy Consult for warfarin Indication: hx VTE  Allergies  Allergen Reactions  . Ciprofloxacin Hcl Hives  . Macrobid [Nitrofurantoin Macrocrystal] Hives, Shortness Of Breath and Rash  . Other Hives, Shortness Of Breath and Rash    NO "-CILLINS"!!!  . Penicillins Shortness Of Breath    Had had cephalosporins without incident Has patient had a PCN reaction causing immediate rash, facial/tongue/throat swelling, SOB or lightheadedness with hypotension: Yes Has patient had a PCN reaction causing severe rash involving mucus membranes or skin necrosis: Unk Has patient had a PCN reaction that required hospitalization: Unk Has patient had a PCN reaction occurring within the last 10 years: No If all of the above answers are "NO", then may proceed with Cephalosporin use.   . Sulfa Antibiotics Hives, Shortness Of Breath and Rash  . Dilaudid [Hydromorphone Hcl] Other (See Comments)    Asystole per pt report  . Lexapro [Escitalopram Oxalate] Other (See Comments)    "I just did not like it."    Patient Measurements: Height: 5\' 7"  (170.2 cm) Weight: 264 lb 8.8 oz (120 kg) IBW/kg (Calculated) : 61.6  Vital Signs: Temp: 97.5 F (36.4 C) (09/09 0410) Temp Source: Oral (09/09 0410) BP: 141/61 (09/09 0410) Pulse Rate: 71 (09/09 0410)  Labs: Recent Labs    06/11/19 1245 06/11/19 1305 06/12/19 0426 06/13/19 0415  HGB 12.1  --  10.3* 9.9*  HCT 37.6  --  32.8* 32.4*  PLT 369  --  336 307  APTT  --   --  77*  --   LABPROT  --  33.1* 29.5* 22.4*  INR  --  3.3* 2.9* 2.0*  CREATININE 2.34*  --  2.16* 2.22*    Estimated Creatinine Clearance: 43.4 mL/min (A) (by C-G formula based on SCr of 2.22 mg/dL (H)).   Medical History: Past Medical History:  Diagnosis Date  . Anxiety   . Depression   . Diabetes ([place])    Type 2  . GERD (gastroesophageal reflux disease)   . History of blood clots    ,DVT-left leg, and early 2000, had blood clot  behind left breast  . History of kidney stones   . Hypercalcemia   . Hyperparathyroidism   . Hypertension    had been on Lisinopril and PCP took her off it 4 months ago  . Iron deficiency anemia   . Left thyroid nodule   . Mass of right ovary   . Nephrolithiasis   . PAOD (peripheral arterial occlusive disease) ([place])   . Peripheral vascular disease ([place])   . Prolonged Q-T interval on ECG 04/19/2019  . Renal calculi   . Tooth loose    top tooth from the right  is loose    Assessment: Patient taking warfarin PTA for history of LLE DVT. Pharmacy consulted to resume warfarin monitor/dose warfarin while inpatient.   PTA dose (confirmed from clinic notes on 05/09/19):  Warfarin 10 mg PO on Tues, Thurs, Sat Warfarin 5 mg PO on all other days of the week  INR on admission = 3.3, slightly elevated  Significant Events: -9/7: Cystoscopy with insertion of right double J stent. Warfarin held that evening  Today, 06/13/19  Hgb 9.9 - slightly decreased  Plt 307 - WNL  No bleeding documented  No significant drug interactions noted, currently on ceftriaxone  Carb modified diet ordered  INR 2 is therapeutic, but decreasing reflective of held dose on 9/7  Goal of Therapy:  INR 2-3  Plan:   Warfarin 10 mg PO once this evening  INR daily  CBC with AM labs tomorrow  Discharge warfarin dose/follow up dependent on if patient discharges on antibiotics with interaction with warfarin.  Lenis Noon, PharmD 06/13/2019,7:56 AM

## 2019-06-13 NOTE — Plan of Care (Signed)
  Problem: Elimination: Goal: Will not experience complications related to bowel motility Outcome: Progressing   

## 2019-06-13 NOTE — Progress Notes (Signed)
PROGRESS NOTE    Terri Sparks  [id number] DOB: 03/11/1976 DOA: 06/11/2019 PCP: Ladell Pier, MD   Brief Narrative:  Patient is a 43 year old female history of insulin-dependent diabetes mellitus type 2, anxiety, depression, left lower extremity DVT, history of hyperparathyroidism/hypercalcemia, hypertension, chronic kidney disease stage III, PVD/stent/PCI 2018, prolonged QT and recurrent kidney stones presented with worsening right flank pain.  Patient also noted with nausea and emesis with decreased oral intake.  Patient with complaints of constipation.  CT renal stone protocol which was done showed right hydronephrosis, hydroureter and significant perinephric edema secondary to 5 mm mid to distal right ureteral calculus and bilateral nonobstructing renal calculi and chronic foci of gas within both kidneys.  Urology was consulted and patient subsequently underwent urgent cystoscopy and right double-J stent insertion per urology on 06/11/2019.  Urine cultures obtained and pending.  Patient placed empirically on IV Rocephin.   Assessment & Plan:   Principal Problem:   Sepsis ([place]) Active Problems:   Acute pyelonephritis   GERD (gastroesophageal reflux disease)   Peripheral vascular disease of lower extremity (HCC)   Tobacco abuse   Diabetes mellitus, type II (HCC)   Anxiety and depression   Thromboembolism (HCC)   Right ureteral stone   Essential hypertension   Supratherapeutic INR   Ureteral stone with hydronephrosis   Iron deficiency anemia due to chronic blood loss   Vitamin B12 deficiency   Folate deficiency   Constipation   AKI (acute kidney injury) (HCC)   Prolonged QT interval   Urinary tract infection without hematuria  1 sepsis secondary to right pyelonephritis/right UTI/right hydronephrosis/right hydroureter right mid to distal ureteral stone with sepsis Patient status post cystoscopy with right retrograde pyelogram and interpretation with insertion of right  double-J stent per Dr. [name] 06/11/2019.  Urine cultures with 10,000 colonies of E. coli with sensitivities pending.  Cultures were also obtained during procedure and pending.  Patient currently afebrile.  Leukocytosis trending down.  Continue empiric IV Rocephin pending culture results and sensitivities.  Gentle hydration.  Patient hasn't been seen by urology since surgery.  Will place a call to them concerning further recommendations.    2.  Insulin-dependent diabetes mellitus type 2 with hyperglycemia Hemoglobin A1c of 8.6.  CBG of 129 this morning.  Continue half home dose Lantus.  Continue sliding scale insulin.    3.  Lower extremity DVT On Coumadin.  INR was supratherapeutic at 3.3.  Coumadin was held.  INR this morning is 2.0.  Coumadin per pharmacy.  4.  PVD/stent/PCI 2018 Continue aspirin.  Patient likely needs to be on a statin which will defer till discharge.  Coumadin per pharmacy.  Outpatient follow-up.  5.  Prolonged QTC QT interval of 513.  Repeat EKG with resolution of QTC prolongation.  Keep potassium greater than 4.  Keep magnesium greater than 2.   6.  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  7.  Hypertension Resume home regimen of Norvasc 5 mg daily.   8.  Constipation Improved with good results after MiraLAX, Senokot-S and sorbitol.  Soapsuds enema was discontinued as patient had good results.  Patient will likely need bowel regimen on discharge.   9.  Hypokalemia Likely secondary to GI losses.  Repleted.  10.  Iron deficiency anemia/vitamin B12 deficiency/folate deficiency Patient with no overt GI bleed.  Hemoglobin at 9.9 from 10.3 from 12.1 on admission.  Likely dilutional.  Patient's hemoglobin was 8.5 on 02/22/2019.  Patient stated  just had her menses.  Anemia panel obtained with a iron level of 12, TIBC of 346, ferritin of 57, folate of 5.9, vitamin B12 of 129.  Patient on oral iron supplementation which will increase  to twice daily.  Continue folic acid and vitamin B12 supplementation.  Transfusion threshold hemoglobin less than 7.  Outpatient follow-up with PCP.   11.  Depression/anxiety Continue home regimen Zoloft.  Continue to hold Wellbutrin.  Patient states Wellbutrin was being used for tobacco cessation however patient with ongoing tobacco use.  Patient on presentation had QTC prolongation and as such would likely not resume Wellbutrin on discharge.  Outpatient follow-up.   12.  Tobacco abuse Tobacco cessation stressed to patient.  Continue nicotine patch.   13.  Morbid obesity BMI 41.43.  Lifestyle changes encouraged.     DVT prophylaxis: INR therapeutic.  Coumadin. Code Status: Full Family Communication: Updated patient.  No family at bedside. Disposition Plan: Likely home when clinically improved, urine cultures have resulted, and when okay with urology hopefully tomorrow..   Consultants:   Urology: Dr. [name] 06/11/2019  Procedures:   CT renal stone protocol 06/11/2019  Cystoscopy with right retrograde pyelogram and interpretation/cystoscopy with insertion of right double-J stent per Dr. [name] 06/11/2019  Antimicrobials:   IV Rocephin 06/11/2019   Subjective: Patient states had good bowel movement after bowel regimen of MiraLAX, Senokot and sorbitol.  Feels significantly better.  Denies chest pain or shortness of breath.  Complaining of bilateral flank pain.  Apprehensive to be discharged today.   Objective: Vitals:   06/12/19 0526 06/12/19 1305 06/12/19 2056 06/13/19 0410  BP: 115/71 127/76 139/78 (!) 141/61  Pulse: 87 84 91 71  Resp: 20  16 18   Temp: 98.9 F (37.2 C) 97.7 F (36.5 C) 98.9 F (37.2 C) (!) 97.5 F (36.4 C)  TempSrc: Oral Oral Oral Oral  SpO2: 98% 93% 98% 99%  Weight:      Height:        Intake/Output Summary (Last 24 hours) at 06/13/2019 1148 Last data filed at 06/13/2019 0700 Gross per 24 hour  Intake 3072.61 ml  Output 1850 ml  Net 1222.61 ml   Filed  Weights   06/11/19 1215 06/11/19 2300  Weight: 120 kg 120 kg    Examination:  General exam: Appears calm and comfortable  Respiratory system: CTAB.  No wheezes, no crackles, no rhonchi.  Normal respiratory effort.  Speaking in full sentences.  Cardiovascular system: Regular rate rhythm no murmurs rubs or gallops.  No JVD.  No lower extremity edema.  Gastrointestinal system: Abdomen is soft, nontender, nondistended, positive bowel sounds.  No rebound.  No guarding.  Central nervous system: Alert and oriented. No focal neurological deficits. Extremities: Symmetric 5 x 5 power. Skin: No rashes, lesions or ulcers Psychiatry: Judgement and insight appear normal. Mood & affect appropriate.     Data Reviewed: I have personally reviewed following labs and imaging studies  CBC: Recent Labs  Lab 06/11/19 1245 06/12/19 0426 06/13/19 0415  WBC 22.3* 16.4* 9.5  NEUTROABS 19.7*  --  6.0  HGB 12.1 10.3* 9.9*  HCT 37.6 32.8* 32.4*  MCV 88.9 91.9 94.5  PLT 369 336 [id number]   Basic Metabolic Panel: Recent Labs  Lab 06/11/19 1245 06/11/19 1659 06/12/19 0426 06/13/19 0415  NA 134*  --  133* 135  K 3.4*  --  4.3 4.1  CL 106  --  105 107  CO2 20*  --  23 23  GLUCOSE 191*  --  110* 153*  BUN 20  --  20 21*  CREATININE 2.34*  --  2.16* 2.22*  CALCIUM 8.8*  --  8.6* 8.7*  MG  --  1.7 1.8 2.5*   GFR: Estimated Creatinine Clearance: 43.4 mL/min (A) (by C-G formula based on SCr of 2.22 mg/dL (H)). Liver Function Tests: No results for input(s): AST, ALT, ALKPHOS, BILITOT, PROT, ALBUMIN in the last 168 hours. No results for input(s): LIPASE, AMYLASE in the last 168 hours. No results for input(s): AMMONIA in the last 168 hours. Coagulation Profile: Recent Labs  Lab 06/11/19 1305 06/12/19 0426 06/13/19 0415  INR 3.3* 2.9* 2.0*   Cardiac Enzymes: No results for input(s): CKTOTAL, CKMB, CKMBINDEX, TROPONINI in the last 168 hours. BNP (last 3 results) No results for input(s): PROBNP in the  last 8760 hours. HbA1C: Recent Labs    06/11/19 1836  HGBA1C 8.6*   CBG: Recent Labs  Lab 06/12/19 1148 06/12/19 1612 06/12/19 2055 06/13/19 0737 06/13/19 1127  GLUCAP 121* 132* 146* 129* 133*   Lipid Profile: No results for input(s): CHOL, HDL, LDLCALC, TRIG, CHOLHDL, LDLDIRECT in the last 72 hours. Thyroid Function Tests: No results for input(s): TSH, T4TOTAL, FREET4, T3FREE, THYROIDAB in the last 72 hours. Anemia Panel: Recent Labs    06/12/19 0854  VITAMINB12 129*  FOLATE 5.9*  FERRITIN 57  TIBC 346  IRON 12*   Sepsis Labs: No results for input(s): PROCALCITON, LATICACIDVEN in the last 168 hours.  Recent Results (from the past 240 hour(s))  SARS Coronavirus 2 [facility] order, Performed in [facility] hospital lab) Nasopharyngeal Nasopharyngeal Swab     Status: None   Collection Time: 06/11/19  3:06 PM   Specimen: Nasopharyngeal Swab  Result Value Ref Range Status   SARS Coronavirus 2 NEGATIVE NEGATIVE Final    Comment: (NOTE) If result is NEGATIVE SARS-CoV-2 target nucleic acids are NOT DETECTED. The SARS-CoV-2 RNA is generally detectable in upper and lower  respiratory specimens during the acute phase of infection. The lowest  concentration of SARS-CoV-2 viral copies this assay can detect is 250  copies / mL. A negative result does not preclude SARS-CoV-2 infection  and should not be used as the sole basis for treatment or other  patient management decisions.  A negative result may occur with  improper specimen collection / handling, submission of specimen other  than nasopharyngeal swab, presence of viral mutation(s) within the  areas targeted by this assay, and inadequate number of viral copies  (<250 copies / mL). A negative result must be combined with clinical  observations, patient history, and epidemiological information. If result is POSITIVE SARS-CoV-2 target nucleic acids are DETECTED. The SARS-CoV-2 RNA is generally detectable in upper and lower   respiratory specimens dur ing the acute phase of infection.  Positive  results are indicative of active infection with SARS-CoV-2.  Clinical  correlation with patient history and other diagnostic information is  necessary to determine patient infection status.  Positive results do  not rule out bacterial infection or co-infection with other viruses. If result is PRESUMPTIVE POSTIVE SARS-CoV-2 nucleic acids MAY BE PRESENT.   A presumptive positive result was obtained on the submitted specimen  and confirmed on repeat testing.  While 2019 novel coronavirus  (SARS-CoV-2) nucleic acids may be present in the submitted sample  additional confirmatory testing may be necessary for epidemiological  and / or clinical management purposes  to differentiate between  SARS-CoV-2 and other Sarbecovirus currently known to infect humans.  If clinically indicated  additional testing with an alternate test  methodology [phone]) is advised. The SARS-CoV-2 RNA is generally  detectable in upper and lower respiratory sp ecimens during the acute  phase of infection. The expected result is Negative. Fact Sheet for Patients:  StrictlyIdeas.no Fact Sheet for Healthcare Providers: BankingDealers.co.za This test is not yet approved or cleared by the [place] FDA and has been authorized for detection and/or diagnosis of SARS-CoV-2 by FDA under an Emergency Use Authorization (EUA).  This EUA will remain in effect (meaning this test can be used) for the duration of the COVID-19 declaration under Section 564(b)(1) of the Act, 21 U.S.C. section 360bbb-3(b)(1), unless the authorization is terminated or revoked sooner. Performed at [facility], [place] [address] [place], [place] [address]   Culture, Urine     Status: Abnormal (Preliminary result)   Collection Time: 06/11/19  5:30 PM   Specimen: Cystoscopy; Urine  Result Value Ref Range Status    Specimen Description   Final    CYSTOSCOPY Performed at [place] [address] [place], [place] [address]    Special Requests   Final    NONE Performed at [facility], [place] [address] [place], [place] [address]    Culture 10,000 COLONIES/mL ESCHERICHIA COLI (A)  Final   Report Status PENDING  Incomplete         Radiology Studies: Dg C-arm 1-60 Min-no Report  Result Date: 06/11/2019 Fluoroscopy was utilized by the requesting physician.  No radiographic interpretation.   Ct Renal Stone Study  Result Date: 06/11/2019 CLINICAL DATA:  RIGHT flank pain for 3 days, some hematuria per patient, suspected recurrent stone disease; history of kidney stones, diabetes mellitus, hypertension, multiple stone extractions and lithotripsy EXAM: CT ABDOMEN AND PELVIS WITHOUT CONTRAST TECHNIQUE: Multidetector CT imaging of the abdomen and pelvis was performed following the standard protocol without IV contrast. Sagittal and coronal MPR images reconstructed from axial data set. No oral contrast administered for this indication COMPARISON:  02/07/2019 FINDINGS: Lower chest: Lung bases clear Hepatobiliary: Gallbladder and liver normal appearance Pancreas: Normal appearance Spleen: Normal appearance Adrenals/Urinary Tract: RIGHT adrenal gland normal appearance. LEFT adrenal nodule 2.1 x 1.9 cm, low attenuation consistent with adenoma. Atrophic LEFT kidney with multiple calcifications. Foci of gas are present within LEFT kidney as well, present on prior study as well. No perinephric stranding or hydronephrosis. No definite renal mass. Small amount of gas within urinary bladder question catheterization. Significant RIGHT hydronephrosis and hydroureter secondary to a 5 mm mid to distal RIGHT ureteral calculus image 72. Additional small nonobstructing RIGHT renal calculi. Single focus of air is seen within the collecting system at the upper pole the RIGHT kidney, decreased  versus prior study. Significant perinephric stranding of RIGHT kidney with fluid/edema extending into anterior pararenal fascia, posterior pararenal fascia and lateral conal fascia. Remaining ureters and bladder otherwise unremarkable. Stomach/Bowel: Normal appendix. Stomach and bowel loops normal appearance. Vascular/Lymphatic: Atherosclerotic calcifications aorta and iliac arteries. Wall stent LEFT common iliac artery. Aorta normal caliber. Scattered normal size lymph nodes. Reproductive: Normal appearing uterus and adnexa/ovaries. Other: No free air or free fluid. Musculoskeletal: No acute osseous findings. IMPRESSION: RIGHT hydronephrosis, hydroureter and significant perinephric edema secondary to a 5 mm mid to distal RIGHT ureteral calculus. Additional BILATERAL non-obstructing renal calculi. Foci of gas within both kidneys, stable on LEFT and decreased on RIGHT since prior study, question due to catheterization or prior instrumentation. LEFT adrenal adenoma. Electronically Signed   By: Crist Infante.D.  On: 06/11/2019 14:05        Scheduled Meds:  cyanocobalamin  1,000 mcg Subcutaneous Daily   ferrous sulfate  325 mg Oral Q breakfast   folic acid  1 mg Oral Daily   insulin aspart  0-15 Units Subcutaneous TID WC   insulin aspart  0-5 Units Subcutaneous QHS   insulin glargine  30 Units Subcutaneous QHS   nicotine  14 mg Transdermal Daily   pantoprazole  40 mg Oral Q0600   polyethylene glycol  17 g Oral BID   senna-docusate  1 tablet Oral BID   sertraline  50 mg Oral Daily   warfarin  10 mg Oral ONCE-1800   Warfarin - Pharmacist Dosing Inpatient   Does not apply q1800   Continuous Infusions:  sodium chloride 100 mL/hr at 06/13/19 0547   cefTRIAXone (ROCEPHIN)  IV Stopped (06/12/19 1900)     LOS: 2 days    Time spent: 40 minutes    Irine Seal, MD [facility]  If 7PM-7AM, please contact night-coverage [email or web address] 06/13/2019, 11:48 AM

## 2019-06-13 NOTE — Progress Notes (Signed)
My office will call to schedule ureteroscopy in the next 1-2 weeks.  She will need to f/u with her PCP in the interim for a lovenox bridge.  Please call with any questions or concerns.    Ellison Hughs, MD [facility] Cell: [phone]

## 2019-06-14 LAB — BASIC METABOLIC PANEL
Anion gap: 8 (ref 5–15)
Anion gap: 8 (ref 5–15)
BUN: 20 mg/dL (ref 6–20)
BUN: 19 mg/dL (ref 6–20)
CO2: 21 mmol/L — ABNORMAL LOW (ref 22–32)
CO2: 24 mmol/L (ref 22–32)
Calcium: 8.6 mg/dL — ABNORMAL LOW (ref 8.9–10.3)
Calcium: 9.2 mg/dL (ref 8.9–10.3)
Chloride: 110 mmol/L (ref 98–111)
Chloride: 104 mmol/L (ref 98–111)
Creatinine, Ser: 2.2 mg/dL — ABNORMAL HIGH (ref 0.44–1.00)
Creatinine, Ser: 2.3 mg/dL — ABNORMAL HIGH (ref 0.44–1.00)
GFR calc Af Amer: 31 mL/min — ABNORMAL LOW (ref >60)
GFR calc Af Amer: 29 mL/min — ABNORMAL LOW (ref >60)
GFR calc non Af Amer: 26 mL/min — ABNORMAL LOW (ref >60)
GFR calc non Af Amer: 25 mL/min — ABNORMAL LOW (ref >60)
Glucose, Bld: 157 mg/dL — ABNORMAL HIGH (ref 70–99)
Glucose, Bld: 143 mg/dL — ABNORMAL HIGH (ref 70–99)
Potassium: 5.3 mmol/L — ABNORMAL HIGH (ref 3.5–5.1)
Potassium: 5 mmol/L (ref 3.5–5.1)
Sodium: 139 mmol/L (ref 135–145)
Sodium: 136 mmol/L (ref 135–145)

## 2019-06-14 LAB — CBC WITH DIFFERENTIAL/PLATELET
Abs Immature Granulocytes: 0.04 10*3/uL (ref 0.00–0.07)
Basophils Absolute: 0.1 10*3/uL (ref 0.0–0.1)
Basophils Relative: 1 %
Eosinophils Absolute: 0.6 10*3/uL — ABNORMAL HIGH (ref 0.0–0.5)
Eosinophils Relative: 7 %
HCT: 34.9 % — ABNORMAL LOW (ref 36.0–46.0)
Hemoglobin: 10.6 g/dL — ABNORMAL LOW (ref 12.0–15.0)
Immature Granulocytes: 1 %
Lymphocytes Relative: 27 %
Lymphs Abs: 2.2 10*3/uL (ref 0.7–4.0)
MCH: 29.1 pg (ref 26.0–34.0)
MCHC: 30.4 g/dL (ref 30.0–36.0)
MCV: 95.9 fL (ref 80.0–100.0)
Monocytes Absolute: 0.7 10*3/uL (ref 0.1–1.0)
Monocytes Relative: 8 %
Neutro Abs: 4.7 10*3/uL (ref 1.7–7.7)
Neutrophils Relative %: 56 %
Platelets: 309 10*3/uL (ref 150–400)
RBC: 3.64 MIL/uL — ABNORMAL LOW (ref 3.87–5.11)
RDW: 16.7 % — ABNORMAL HIGH (ref 11.5–15.5)
WBC: 8.2 10*3/uL (ref 4.0–10.5)
nRBC: 0 % (ref 0.0–0.2)

## 2019-06-14 LAB — GLUCOSE, CAPILLARY
Glucose-Capillary: 122 mg/dL — ABNORMAL HIGH (ref 70–99)
Glucose-Capillary: 142 mg/dL — ABNORMAL HIGH (ref 70–99)
Glucose-Capillary: 108 mg/dL — ABNORMAL HIGH (ref 70–99)
Glucose-Capillary: 115 mg/dL — ABNORMAL HIGH (ref 70–99)
Glucose-Capillary: 119 mg/dL — ABNORMAL HIGH (ref 70–99)

## 2019-06-14 LAB — PROTIME-INR
INR: 1.9 — ABNORMAL HIGH (ref 0.8–1.2)
Prothrombin Time: 21.6 seconds — ABNORMAL HIGH (ref 11.4–15.2)

## 2019-06-14 LAB — URINE CULTURE: Culture: 10000 — AB

## 2019-06-14 MED ORDER — WARFARIN SODIUM 5 MG PO TABS
10.0000 mg | ORAL_TABLET | Freq: Once | ORAL | Status: AC
  Administered 2019-06-14: 10 mg via ORAL
  Filled 2019-06-14: qty 2

## 2019-06-14 MED ORDER — LIP MEDEX EX OINT
TOPICAL_OINTMENT | CUTANEOUS | Status: DC | PRN
  Filled 2019-06-14: qty 7

## 2019-06-14 MED ORDER — ALBUTEROL SULFATE HFA 108 (90 BASE) MCG/ACT IN AERS
2.0000 | INHALATION_SPRAY | Freq: Four times a day (QID) | RESPIRATORY_TRACT | Status: DC | PRN
  Filled 2019-06-14: qty 6.7

## 2019-06-14 MED FILL — SERTRALINE HCL 50 MG TABS: 50 | 30 days supply | Qty: 30 | Fill #1

## 2019-06-14 MED FILL — WARFARIN SODIUM 5 MG TABLET: 5 | 42 days supply | Qty: 60 | Fill #1

## 2019-06-14 MED FILL — GABAPENTIN 400 MG CAPSULE: 400 | 30 days supply | Qty: 120 | Fill #3

## 2019-06-14 MED FILL — ?HUMALOG 100 UNITS/ML KWIKP: 100 | 33 days supply | Qty: 15 | Fill #1

## 2019-06-14 MED FILL — ?BASAGLAR 100 UNITS/ML KWPE: 100 | 30 days supply | Qty: 21 | Fill #1

## 2019-06-14 MED FILL — ?AMLODIPINE BESYLATE 5MG TA: 5 | 30 days supply | Qty: 30 | Fill #1

## 2019-06-14 MED FILL — PROMETHAZINE 12.5 MG TABLET: 12.5 | 5 days supply | Qty: 30 | Fill #1

## 2019-06-14 NOTE — Progress Notes (Signed)
ANTICOAGULATION CONSULT NOTE - Initial Consult  Pharmacy Consult for warfarin Indication: hx VTE  Allergies  Allergen Reactions  . Ciprofloxacin Hcl Hives  . Macrobid [Nitrofurantoin Macrocrystal] Hives, Shortness Of Breath and Rash  . Other Hives, Shortness Of Breath and Rash    NO "-CILLINS"!!!  . Penicillins Shortness Of Breath    Had had cephalosporins without incident Has patient had a PCN reaction causing immediate rash, facial/tongue/throat swelling, SOB or lightheadedness with hypotension: Yes Has patient had a PCN reaction causing severe rash involving mucus membranes or skin necrosis: Unk Has patient had a PCN reaction that required hospitalization: Unk Has patient had a PCN reaction occurring within the last 10 years: No If all of the above answers are "NO", then may proceed with Cephalosporin use.   . Sulfa Antibiotics Hives, Shortness Of Breath and Rash  . Dilaudid [Hydromorphone Hcl] Other (See Comments)    Asystole per pt report  . Lexapro [Escitalopram Oxalate] Other (See Comments)    "I just did not like it."    Patient Measurements: Height: 5\' 7"  (170.2 cm) Weight: 264 lb 8.8 oz (120 kg) IBW/kg (Calculated) : 61.6  Vital Signs: Temp: 97.6 F (36.4 C) (09/10 0431) Temp Source: Oral (09/10 0431) BP: 121/74 (09/10 0843) Pulse Rate: 70 (09/10 0843)  Labs: Recent Labs    06/12/19 0426 06/13/19 0415 06/14/19 0444 06/14/19 0852  HGB 10.3* 9.9* 10.6*  --   HCT 32.8* 32.4* 34.9*  --   PLT 336 307 309  --   APTT 77*  --   --   --   LABPROT 29.5* 22.4*  --  21.6*  INR 2.9* 2.0*  --  1.9*  CREATININE 2.16* 2.22* 2.20*  --     Estimated Creatinine Clearance: 43.8 mL/min (A) (by C-G formula based on SCr of 2.2 mg/dL (H)).   Medical History: Past Medical History:  Diagnosis Date  . Anxiety   . Depression   . Diabetes ([place])    Type 2  . GERD (gastroesophageal reflux disease)   . History of blood clots    ,DVT-left leg, and early 2000, had blood clot  behind left breast  . History of kidney stones   . Hypercalcemia   . Hyperparathyroidism   . Hypertension    had been on Lisinopril and PCP took her off it 4 months ago  . Iron deficiency anemia   . Left thyroid nodule   . Mass of right ovary   . Nephrolithiasis   . PAOD (peripheral arterial occlusive disease) ([place])   . Peripheral vascular disease ([place])   . Prolonged Q-T interval on ECG 04/19/2019  . Renal calculi   . Tooth loose    top tooth from the right  is loose    Assessment: Patient taking warfarin PTA for history of LLE DVT. Pharmacy consulted to resume warfarin monitor/dose warfarin while inpatient.   PTA dose (confirmed from clinic notes on 05/09/19):  Warfarin 10 mg PO on Tues, Thurs, Sat Warfarin 5 mg PO on all other days of the week  INR on admission = 3.3, slightly elevated  Significant Events: -9/7: Cystoscopy with insertion of right double J stent. Warfarin held that evening  Today, 06/14/19  Hgb 10.6 - slightly low but stable  Plt 309 - WNL, stable  No bleeding documented  No significant drug interactions noted, currently on ceftriaxone  Carb modified diet ordered, patient eating 100% of meals  INR = 1.9 is slightly subtherapeutic but no significant decrease from yesterday  Goal of Therapy:  INR 2-3   Plan:   Warfarin 10 mg PO once this evening - continue home regimen  INR daily  CBC with AM labs tomorrow  Discharge warfarin dose/follow up dependent on if patient discharges on antibiotics with significant interaction with warfarin.  Lenis Noon, PharmD 06/14/2019,9:21 AM

## 2019-06-14 NOTE — Progress Notes (Signed)
Patient ID: Terri Sparks, female   DOB: 1976/05/12, 43 y.o.   MRN: [id number]                                                                PROGRESS NOTE                                                                                                                                                                                                             Patient Demographics:    Terri Sparks, is a 43 y.o. female, DOB - 03/13/1976, [id number]  Admit date - 06/11/2019   Admitting Physician Mercy Riding, MD  Outpatient Primary MD for the patient is Ladell Pier, MD  LOS - 3  Outpatient Specialists:  Chief Complaint  Patient presents with  . Flank Pain       Brief Narrative  Patient is a 43 year old female history of insulin-dependent diabetes mellitus type 2, anxiety, depression, left lower extremity DVT, history of hyperparathyroidism/hypercalcemia, hypertension, chronic kidney disease stage III, PVD/stent/PCI 2018, prolonged QT and recurrent kidney stones presented with worsening right flank pain.  Patient also noted with nausea and emesis with decreased oral intake.  Patient with complaints of constipation.  CT renal stone protocol which was done showed right hydronephrosis, hydroureter and significant perinephric edema secondary to 5 mm mid to distal right ureteral calculus and bilateral nonobstructing renal calculi and chronic foci of gas within both kidneys.  Urology was consulted and patient subsequently underwent urgent cystoscopy and right double-J stent insertion per urology on 06/11/2019.  Urine cultures obtained and pending.  Patient placed empirically on IV Rocephin.   Subjective:    Terri Sparks today has slight right flank pain.  Afebrile. Denies dysuria, hematuria.   No headache, No chest pain, No abdominal pain - No Nausea, No new weakness tingling or numbness, No Cough - SOB.   Assessment  & Plan :    Principal Problem:   Sepsis ([place]) Active Problems:  GERD (gastroesophageal reflux disease)   Peripheral vascular disease of lower extremity (HCC)   Tobacco abuse   Diabetes mellitus, type II (HCC)   Anxiety and depression   Thromboembolism (HCC)   Right ureteral stone   Essential hypertension   Supratherapeutic INR   Ureteral stone with hydronephrosis   Acute pyelonephritis  Iron deficiency anemia due to chronic blood loss   Vitamin B12 deficiency   Folate deficiency   Constipation   AKI (acute kidney injury) (HCC)   Prolonged QT interval   Urinary tract infection without hematuria    1 sepsis secondary to right pyelonephritis/right UTI/right hydronephrosis/right hydroureter right mid to distal ureteral stone with sepsis Patient status post cystoscopy with right retrograde pyelogram and interpretation with insertion of right double-J stent per Dr. [name] 06/11/2019.  Urine cultures with 10,000 colonies of E. coli with sensitivities pending.  Cultures were also obtained during procedure and pending.  Patient currently afebrile.  Leukocytosis trending down.  Continue empiric IV Rocephin.  Appreciate urology recommendations. Urine culture -> E. Coli, resistant to Keflex, Macrobid, pt is allergic to sulfa and cipro Pt is allergic to all the oral medications that can be used for UTI.  We will continue rocephin 1gm iv qday,  Pt declines SNF  2.  Insulin-dependent diabetes mellitus type 2 with hyperglycemia Hemoglobin A1c of 8.6.  CBG of 129 this morning.  Continue half home dose Lantus.  Continue sliding scale insulin.    3.  Lower extremity DVT On Coumadin.  INR was supratherapeutic at 3.3.  Coumadin was held.  INR this morning is 2.0.  Coumadin per pharmacy.  4.  PVD/stent/PCI 2018 Continue aspirin.  Patient likely needs to be on a statin which will defer till discharge.  Coumadin per pharmacy.  Outpatient follow-up.  5.  Prolonged QTC QT interval of 513.  Repeat EKG with resolution of QTC prolongation.  Keep potassium greater than 4.   Keep magnesium greater than 2.   6.  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  7.  Hypertension Resume home regimen of Norvasc 5 mg daily.   8.  Constipation Improved with good results after MiraLAX, Senokot-S and sorbitol.  Soapsuds enema was discontinued as patient had good results.  Patient will likely need bowel regimen on discharge.   9.  Hypokalemia Likely secondary to GI losses.  Repleted.  10.  Iron deficiency anemia/vitamin B12 deficiency/folate deficiency Patient with no overt GI bleed.  Hemoglobin at 9.9 from 10.3 from 12.1 on admission.  Likely dilutional.  Patient's hemoglobin was 8.5 on 02/22/2019.  Patient stated just had her menses.  Anemia panel obtained with a iron level of 12, TIBC of 346, ferritin of 57, folate of 5.9, vitamin B12 of 129.  Patient on oral iron supplementation which will increase to twice daily.  Continue folic acid and vitamin B12 supplementation.  Transfusion threshold hemoglobin less than 7.  Outpatient follow-up with PCP.   11.  Depression/anxiety Continue home regimen Zoloft.  Continue to hold Wellbutrin.  Patient states Wellbutrin was being used for tobacco cessation however patient with ongoing tobacco use.  Patient on presentation had QTC prolongation and as such would likely not resume Wellbutrin on discharge.  Outpatient follow-up.   12.  Tobacco abuse Tobacco cessation stressed to patient.  Continue nicotine patch.   13.  Morbid obesity BMI 41.43.  Lifestyle changes encouraged.  14. Hyperkalemia  Check bmp in afternoon   DVT prophylaxis: INR therapeutic.  Coumadin. Code Status: Full Family Communication: Updated patient.  No family at bedside. Disposition Plan: Likely home when clinically improved, urine cultures have resulted, and when okay with urology hopefully tomorrow..   Consultants:   Urology: Dr. [name] 06/11/2019  Procedures:   CT renal stone protocol 06/11/2019   Cystoscopy with right retrograde pyelogram and interpretation/cystoscopy with  insertion of right double-J stent per Dr. [name] 06/11/2019  Antimicrobials:   IV Rocephin 06/11/2019    Lab Results  Component Value Date   PLT 309 06/14/2019    Anti-infectives (From admission, onward)   Start     Dose/Rate Route Frequency Ordered Stop   06/12/19 1600  cefTRIAXone (ROCEPHIN) 1 g in sodium chloride 0.9 % 100 mL IVPB  Status:  Discontinued     1 g 200 mL/hr over 30 Minutes Intravenous Every 24 hours 06/11/19 1835 06/12/19 0823   06/12/19 1600  cefTRIAXone (ROCEPHIN) 2 g in sodium chloride 0.9 % 100 mL IVPB     2 g 200 mL/hr over 30 Minutes Intravenous Every 24 hours 06/12/19 0823     06/11/19 1718  sodium chloride 0.9 % with cefTRIAXone (ROCEPHIN) ADS Med    Note to Pharmacy: Jefm Miles   : cabinet override      06/11/19 1718 06/12/19 0529   06/11/19 1500  cefTRIAXone (ROCEPHIN) 1 g in sodium chloride 0.9 % 100 mL IVPB     1 g 200 mL/hr over 30 Minutes Intravenous  Once 06/11/19 1448 06/11/19 1608        Objective:   Vitals:   06/13/19 0410 06/13/19 2137 06/14/19 0431 06/14/19 0843  BP: (!) 141/61 115/74 123/74 121/74  Pulse: 71 74 68 70  Resp: 18 18 18    Temp: (!) 97.5 F (36.4 C) 97.9 F (36.6 C) 97.6 F (36.4 C)   TempSrc: Oral Oral Oral   SpO2: 99% 98% 100%   Weight:      Height:        Wt Readings from Last 3 Encounters:  06/11/19 120 kg  05/01/19 122.2 kg  04/30/19 119.7 kg     Intake/Output Summary (Last 24 hours) at 06/14/2019 1032 Last data filed at 06/14/2019 1025 Gross per 24 hour  Intake 4185.61 ml  Output 2200 ml  Net 1985.61 ml     Physical Exam  Awake Alert, Oriented X 3, No new F.N deficits, Normal affect [place].AT,PERRAL Supple Neck,No JVD, No cervical lymphadenopathy appriciated.  Symmetrical Chest wall movement, Good air movement bilaterally, CTAB RRR,No Gallops,Rubs or new Murmurs, No Parasternal Heave +ve B.Sounds, Abd Soft, No  tenderness, No organomegaly appriciated, No rebound - guarding or rigidity. No Cyanosis, Clubbing or edema, No new Rash or bruise  No CVA tenderness.      Data Review:    CBC Recent Labs  Lab 06/11/19 1245 06/12/19 0426 06/13/19 0415 06/14/19 0444  WBC 22.3* 16.4* 9.5 8.2  HGB 12.1 10.3* 9.9* 10.6*  HCT 37.6 32.8* 32.4* 34.9*  PLT 369 336 307 309  MCV 88.9 91.9 94.5 95.9  MCH 28.6 28.9 28.9 29.1  MCHC 32.2 31.4 30.6 30.4  RDW 16.8* 16.7* 16.8* 16.7*  LYMPHSABS 1.3  --  2.3 2.2  MONOABS 1.3*  --  0.6 0.7  EOSABS 0.0  --  0.5 0.6*  BASOSABS 0.0  --  0.1 0.1    Chemistries  Recent Labs  Lab 06/11/19 1245 06/11/19 1659 06/12/19 0426 06/13/19 0415 06/14/19 0444  NA 134*  --  133* 135 139  K 3.4*  --  4.3 4.1 5.3*  CL 106  --  105 107 110  CO2 20*  --  23 23 21*  GLUCOSE 191*  --  110* 153* 157*  BUN 20  --  20 21* 20  CREATININE 2.34*  --  2.16* 2.22* 2.20*  CALCIUM 8.8*  --  8.6* 8.7* 8.6*  MG  --  1.7 1.8 2.5*  --    ------------------------------------------------------------------------------------------------------------------ No results for input(s): CHOL, HDL, LDLCALC, TRIG, CHOLHDL, LDLDIRECT in the last 72 hours.  Lab Results  Component Value Date   HGBA1C 8.6 (H) 06/11/2019   ------------------------------------------------------------------------------------------------------------------ No results for input(s): TSH, T4TOTAL, T3FREE, THYROIDAB in the last 72 hours.  Invalid input(s): FREET3 ------------------------------------------------------------------------------------------------------------------ Recent Labs    06/12/19 0854  VITAMINB12 129*  FOLATE 5.9*  FERRITIN 57  TIBC 346  IRON 12*    Coagulation profile Recent Labs  Lab 06/11/19 1305 06/12/19 0426 06/13/19 0415 06/14/19 0852  INR 3.3* 2.9* 2.0* 1.9*    No results for input(s): DDIMER in the last 72 hours.  Cardiac Enzymes No results for input(s): CKMB, TROPONINI,  MYOGLOBIN in the last 168 hours.  Invalid input(s): CK ------------------------------------------------------------------------------------------------------------------    Component Value Date/Time   BNP 218.9 (H) 07/01/2018 0809    Inpatient Medications  Scheduled Meds: . amLODipine  5 mg Oral Daily  . cyanocobalamin  1,000 mcg Subcutaneous Daily  . ferrous sulfate  325 mg Oral BID WC  . folic acid  1 mg Oral Daily  . insulin aspart  0-15 Units Subcutaneous TID WC  . insulin aspart  0-5 Units Subcutaneous QHS  . insulin glargine  30 Units Subcutaneous QHS  . nicotine  14 mg Transdermal Daily  . pantoprazole  40 mg Oral Q0600  . polyethylene glycol  17 g Oral BID  . senna-docusate  1 tablet Oral BID  . sertraline  50 mg Oral Daily  . warfarin  10 mg Oral ONCE-1800  . Warfarin - Pharmacist Dosing Inpatient   Does not apply q1800   Continuous Infusions: . cefTRIAXone (ROCEPHIN)  IV Stopped (06/13/19 1738)   PRN Meds:.acetaminophen, albuterol, bisacodyl, hydrOXYzine, loratadine, menthol-cetylpyridinium, morphine injection, oxyCODONE-acetaminophen, prochlorperazine, sodium chloride, sodium phosphate, zolpidem  Micro Results Recent Results (from the past 240 hour(s))  SARS Coronavirus 2 [facility] order, Performed in [facility] hospital lab) Nasopharyngeal Nasopharyngeal Swab     Status: None   Collection Time: 06/11/19  3:06 PM   Specimen: Nasopharyngeal Swab  Result Value Ref Range Status   SARS Coronavirus 2 NEGATIVE NEGATIVE Final    Comment: (NOTE) If result is NEGATIVE SARS-CoV-2 target nucleic acids are NOT DETECTED. The SARS-CoV-2 RNA is generally detectable in upper and lower  respiratory specimens during the acute phase of infection. The lowest  concentration of SARS-CoV-2 viral copies this assay can detect is 250  copies / mL. A negative result does not preclude SARS-CoV-2 infection  and should not be used as the sole basis for treatment or other  patient  management decisions.  A negative result may occur with  improper specimen collection / handling, submission of specimen other  than nasopharyngeal swab, presence of viral mutation(s) within the  areas targeted by this assay, and inadequate number of viral copies  (<250 copies / mL). A negative result must be combined with clinical  observations, patient history, and epidemiological information. If result is POSITIVE SARS-CoV-2 target nucleic acids are DETECTED. The SARS-CoV-2 RNA is generally detectable in upper and lower  respiratory specimens dur ing the acute phase of infection.  Positive  results are indicative of active infection with SARS-CoV-2.  Clinical  correlation with patient history and other diagnostic information is  necessary to determine patient infection status.  Positive results do  not rule out bacterial infection or co-infection with other viruses. If result is PRESUMPTIVE POSTIVE SARS-CoV-2 nucleic acids MAY BE PRESENT.  A presumptive positive result was obtained on the submitted specimen  and confirmed on repeat testing.  While 2019 novel coronavirus  (SARS-CoV-2) nucleic acids may be present in the submitted sample  additional confirmatory testing may be necessary for epidemiological  and / or clinical management purposes  to differentiate between  SARS-CoV-2 and other Sarbecovirus currently known to infect humans.  If clinically indicated additional testing with an alternate test  methodology [phone]) is advised. The SARS-CoV-2 RNA is generally  detectable in upper and lower respiratory sp ecimens during the acute  phase of infection. The expected result is Negative. Fact Sheet for Patients:  StrictlyIdeas.no Fact Sheet for Healthcare Providers: BankingDealers.co.za This test is not yet approved or cleared by the [place] FDA and has been authorized for detection and/or diagnosis of SARS-CoV-2 by FDA under  an Emergency Use Authorization (EUA).  This EUA will remain in effect (meaning this test can be used) for the duration of the COVID-19 declaration under Section 564(b)(1) of the Act, 21 U.S.C. section 360bbb-3(b)(1), unless the authorization is terminated or revoked sooner. Performed at [facility], [place] [address], [place], [place] [address]   Culture, Urine     Status: Abnormal   Collection Time: 06/11/19  5:30 PM   Specimen: Cystoscopy; Urine  Result Value Ref Range Status   Specimen Description   Final    CYSTOSCOPY Performed at [facility] [address], [place], [place] [address]    Special Requests   Final    NONE Performed at [facility], [place] [address] [place], [place] [address]    Culture 10,000 COLONIES/mL ESCHERICHIA COLI (A)  Final   Report Status 06/14/2019 FINAL  Final   Organism ID, Bacteria ESCHERICHIA COLI (A)  Final      Susceptibility   Escherichia coli - MIC*    AMPICILLIN >=32 RESISTANT Resistant     CEFAZOLIN >=64 RESISTANT Resistant     CEFEPIME <=1 SENSITIVE Sensitive     CEFTAZIDIME <=1 SENSITIVE Sensitive     CEFTRIAXONE <=1 SENSITIVE Sensitive     CIPROFLOXACIN <=0.25 SENSITIVE Sensitive     GENTAMICIN <=1 SENSITIVE Sensitive     IMIPENEM <=0.25 SENSITIVE Sensitive     TRIMETH/SULFA <=20 SENSITIVE Sensitive     AMPICILLIN/SULBACTAM >=32 RESISTANT Resistant     PIP/TAZO 64 INTERMEDIATE Intermediate     Extended ESBL NEGATIVE Sensitive     * 10,000 COLONIES/mL ESCHERICHIA COLI    Radiology Reports Dg C-arm 1-60 Min-no Report  Result Date: 06/11/2019 Fluoroscopy was utilized by the requesting physician.  No radiographic interpretation.   Ct Renal Stone Study  Result Date: 06/11/2019 CLINICAL DATA:  RIGHT flank pain for 3 days, some hematuria per patient, suspected recurrent stone disease; history of kidney stones, diabetes mellitus, hypertension, multiple stone extractions and  lithotripsy EXAM: CT ABDOMEN AND PELVIS WITHOUT CONTRAST TECHNIQUE: Multidetector CT imaging of the abdomen and pelvis was performed following the standard protocol without IV contrast. Sagittal and coronal MPR images reconstructed from axial data set. No oral contrast administered for this indication COMPARISON:  02/07/2019 FINDINGS: Lower chest: Lung bases clear Hepatobiliary: Gallbladder and liver normal appearance Pancreas: Normal appearance Spleen: Normal appearance Adrenals/Urinary Tract: RIGHT adrenal gland normal appearance. LEFT adrenal nodule 2.1 x 1.9 cm, low attenuation consistent with adenoma. Atrophic LEFT kidney with multiple calcifications. Foci of gas are present within LEFT kidney as well, present on prior study as well. No perinephric stranding or hydronephrosis. No definite renal mass. Small  amount of gas within urinary bladder question catheterization. Significant RIGHT hydronephrosis and hydroureter secondary to a 5 mm mid to distal RIGHT ureteral calculus image 72. Additional small nonobstructing RIGHT renal calculi. Single focus of air is seen within the collecting system at the upper pole the RIGHT kidney, decreased versus prior study. Significant perinephric stranding of RIGHT kidney with fluid/edema extending into anterior pararenal fascia, posterior pararenal fascia and lateral conal fascia. Remaining ureters and bladder otherwise unremarkable. Stomach/Bowel: Normal appendix. Stomach and bowel loops normal appearance. Vascular/Lymphatic: Atherosclerotic calcifications aorta and iliac arteries. Wall stent LEFT common iliac artery. Aorta normal caliber. Scattered normal size lymph nodes. Reproductive: Normal appearing uterus and adnexa/ovaries. Other: No free air or free fluid. Musculoskeletal: No acute osseous findings. IMPRESSION: RIGHT hydronephrosis, hydroureter and significant perinephric edema secondary to a 5 mm mid to distal RIGHT ureteral calculus. Additional BILATERAL  non-obstructing renal calculi. Foci of gas within both kidneys, stable on LEFT and decreased on RIGHT since prior study, question due to catheterization or prior instrumentation. LEFT adrenal adenoma. Electronically Signed   By: Lavonia Dana M.D.   On: 06/11/2019 14:05    Time Spent in minutes  30   Jani Gravel M.D on 06/14/2019 at 10:32 AM  Between 7am to 7pm - Pager - [phone]  After 7pm go to [email or web address] - password [facility]  [facility] -  Office  [phone]

## 2019-06-15 ENCOUNTER — Telehealth: Payer: Self-pay | Admitting: Internal Medicine

## 2019-06-15 LAB — CBC
HCT: 34.4 % — ABNORMAL LOW (ref 36.0–46.0)
Hemoglobin: 10.5 g/dL — ABNORMAL LOW (ref 12.0–15.0)
MCH: 28.6 pg (ref 26.0–34.0)
MCHC: 30.5 g/dL (ref 30.0–36.0)
MCV: 93.7 fL (ref 80.0–100.0)
Platelets: 348 10*3/uL (ref 150–400)
RBC: 3.67 MIL/uL — ABNORMAL LOW (ref 3.87–5.11)
RDW: 16.4 % — ABNORMAL HIGH (ref 11.5–15.5)
WBC: 6.7 10*3/uL (ref 4.0–10.5)
nRBC: 0 % (ref 0.0–0.2)

## 2019-06-15 LAB — COMPREHENSIVE METABOLIC PANEL
ALT: 11 U/L (ref 0–44)
AST: 11 U/L — ABNORMAL LOW (ref 15–41)
Albumin: 3.1 g/dL — ABNORMAL LOW (ref 3.5–5.0)
Alkaline Phosphatase: 75 U/L (ref 38–126)
Anion gap: 10 (ref 5–15)
BUN: 20 mg/dL (ref 6–20)
CO2: 23 mmol/L (ref 22–32)
Calcium: 9.3 mg/dL (ref 8.9–10.3)
Chloride: 104 mmol/L (ref 98–111)
Creatinine, Ser: 2.15 mg/dL — ABNORMAL HIGH (ref 0.44–1.00)
GFR calc Af Amer: 31 mL/min — ABNORMAL LOW (ref >60)
GFR calc non Af Amer: 27 mL/min — ABNORMAL LOW (ref >60)
Glucose, Bld: 171 mg/dL — ABNORMAL HIGH (ref 70–99)
Potassium: 4.5 mmol/L (ref 3.5–5.1)
Sodium: 137 mmol/L (ref 135–145)
Total Bilirubin: 0.2 mg/dL — ABNORMAL LOW (ref 0.3–1.2)
Total Protein: 6.6 g/dL (ref 6.5–8.1)

## 2019-06-15 LAB — GLUCOSE, CAPILLARY
Glucose-Capillary: 137 mg/dL — ABNORMAL HIGH (ref 70–99)
Glucose-Capillary: 139 mg/dL — ABNORMAL HIGH (ref 70–99)
Glucose-Capillary: 144 mg/dL — ABNORMAL HIGH (ref 70–99)
Glucose-Capillary: 155 mg/dL — ABNORMAL HIGH (ref 70–99)
Glucose-Capillary: 183 mg/dL — ABNORMAL HIGH (ref 70–99)

## 2019-06-15 LAB — PROTIME-INR
INR: 2.1 — ABNORMAL HIGH (ref 0.8–1.2)
Prothrombin Time: 23.5 seconds — ABNORMAL HIGH (ref 11.4–15.2)

## 2019-06-15 MED ORDER — WARFARIN SODIUM 5 MG PO TABS
5.0000 mg | ORAL_TABLET | Freq: Once | ORAL | Status: AC
  Administered 2019-06-15: 5 mg via ORAL
  Filled 2019-06-15: qty 1

## 2019-06-15 MED ORDER — HYDROXYZINE HCL 25 MG PO TABS
50.0000 mg | ORAL_TABLET | Freq: Three times a day (TID) | ORAL | Status: DC | PRN
  Administered 2019-06-15 – 2019-06-16 (×3): 50 mg via ORAL
  Filled 2019-06-15 (×4): qty 2

## 2019-06-15 NOTE — Progress Notes (Signed)
Patient ID: Terri Sparks, female   DOB: 1976/03/06, 43 y.o.   MRN: [id number]                                                                PROGRESS NOTE                                                                                                                                                                                                             Patient Demographics:    Terri Sparks, is a 43 y.o. female, DOB - 30-Nov-1975, [id number]  Admit date - 06/11/2019   Admitting Physician Mercy Riding, MD  Outpatient Primary MD for the patient is Ladell Pier, MD  LOS - 4  Outpatient Specialists:     Chief Complaint  Patient presents with   Flank Pain       Brief Narrative   Patient is a 43 year old female history of insulin-dependent diabetes mellitus type 2, anxiety, depression, left lower extremity DVT, history of hyperparathyroidism/hypercalcemia, hypertension, chronic kidney disease stage III, PVD/stent/PCI 2018, prolonged QT and recurrent kidney stones presented with worsening right flank pain. Patient also noted with nausea and emesis with decreased oral intake. Patient with complaints of constipation. CT renal stone protocol which was done showed right hydronephrosis, hydroureter and significant perinephric edema secondary to 5 mm mid to distal right ureteral calculus and bilateral nonobstructing renal calculi and chronic foci of gas within both kidneys. Urology was consulted and patient subsequently underwent urgent cystoscopy and right double-J stent insertion per urology on 06/11/2019. Urine cultures obtained and pending. Patient placed empirically on IV Rocephin.   Subjective:    Terri Sparks today right flank pain still.  Seems a little better.  Denies fever, chills, dysuria, hematuria.  Pt has resistant E. Coli. Pt has concerned about right flank/ radiation of pain towards the right groin. Pt is allerghic to sulfa, and pcn, and  Cipro.    No headache, No  chest pain, No abdominal pain - No Nausea, No new weakness tingling or numbness, No Cough - SOB.    Assessment  & Plan :    Principal Problem:   Sepsis ([place]) Active Problems:   GERD (gastroesophageal reflux disease)   Peripheral vascular disease of lower extremity (HCC)   Tobacco abuse   Diabetes  mellitus, type II ([place])   Anxiety and depression   Thromboembolism ([place])   Right ureteral stone   Essential hypertension   Supratherapeutic INR   Ureteral stone with hydronephrosis   Acute pyelonephritis   Iron deficiency anemia due to chronic blood loss   Vitamin B12 deficiency   Folate deficiency   Constipation   AKI (acute kidney injury) (HCC)   Prolonged QT interval   Urinary tract infection without hematuria   1 sepsis secondary to right pyelonephritis/right UTI/righthydronephrosis/right hydroureter right mid to distal ureteral stone withsepsis Patient status post cystoscopy with right retrograde pyelogram and interpretation with insertion of right double-J stent per Dr. [name] 06/11/2019. Urine cultures with 10,000 colonies of E. coli with sensitivities pending.Cultures were also obtained during procedure and pending. Patient currently afebrile. Leukocytosis trending down. Continue empiric IV Rocephin.  Appreciate urology recommendations. Urine culture -> E. Coli, resistant to Keflex, Macrobid, pt is allergic to sulfa and cipro Pt is allergic to oral medications that can be used for UTI.  We will continue rocephin 1gm iv qday.  Pt is willing to try omnicef. If afebrile tomorrow, will try to discharge on omnicef per pharmacy recommendation.   2. Insulin-dependent diabetes mellitus type 2 with hyperglycemia Hemoglobin A1c of 8.6. CBG of 129this morning. Continue half home dose Lantus. Continue sliding scale insulin.   3. Lower extremity DVT On Coumadin. INR was supratherapeutic at 3.3. Coumadin was held. INR this morning is 2.0. Coumadin per pharmacy.  4.  PVD/stent/PCI 2018 Continue aspirin. Patient likely needs to be on a statin which will defer till discharge. Coumadin per pharmacy. Outpatient follow-up.  5. Prolonged QTC QT interval of 513. Repeat EKGwith resolution of QTC prolongation. Keep potassium greater than 4. Keep magnesium greater than 2.   6. Chronic kidney disease stage III progressing to chronic kidney diseasestage IV Stable.  7. Hypertension Resume home regimen of Norvasc 5 mg daily.  8. Constipation Improved with good results after MiraLAX, Senokot-S and sorbitol. Soapsuds enema was discontinued as patient had good results. Patient will likely need bowel regimen on discharge.   9. Hypokalemia Likely secondary to GI losses. Repleted.  10. Iron deficiency anemia/vitamin B12 deficiency/folate deficiency Patient with no overt GI bleed. Hemoglobin at 9.9 from10.3 from 12.1 on admission. Likely dilutional. Patient's hemoglobin was 8.5 on 02/22/2019. Patient stated just had her menses. Anemia panel obtained with a iron level of 12, TIBC of 346, ferritin of 57, folate of 5.9, vitamin B12 of 129. Patient on oral iron supplementation which will increase to twice daily. Continue folic acid and vitamin B12 supplementation. Transfusion threshold hemoglobin less than 7. Outpatient follow-up with PCP.   11. Depression/anxiety Continue home regimen Zoloft. Continue to hold Wellbutrin. Patient states Wellbutrin was being used for tobacco cessation however patient with ongoing tobacco use. Patient on presentation had QTC prolongation and as such would likely not resume Wellbutrin on discharge. Outpatient follow-up.   12. Tobacco abuse Tobacco cessation stressed to patient.Continue nicotine patch.  13. Morbid obesity BMI 41.43. Lifestyle changes encouraged.  14. Hyperkalemia resolved Check cmp in am  DVT prophylaxis:INR therapeutic. Coumadin. Code Status:Full Family  Communication:Updated patient. No family at bedside. Disposition Plan: home tomorrow  Consultants:  Urology: Dr. [name] 06/11/2019  Procedures:  CT renal stone protocol 06/11/2019  Cystoscopy with right retrograde pyelogram and interpretation/cystoscopy with insertion of right double-J stent per Dr. [name] 06/11/2019  Antimicrobials:   IV Rocephin 06/11/2019        Lab Results  Component Value Date  PLT 348 06/15/2019       Anti-infectives (From admission, onward)   Start     Dose/Rate Route Frequency Ordered Stop   06/12/19 1600  cefTRIAXone (ROCEPHIN) 1 g in sodium chloride 0.9 % 100 mL IVPB  Status:  Discontinued     1 g 200 mL/hr over 30 Minutes Intravenous Every 24 hours 06/11/19 1835 06/12/19 0823   06/12/19 1600  cefTRIAXone (ROCEPHIN) 2 g in sodium chloride 0.9 % 100 mL IVPB     2 g 200 mL/hr over 30 Minutes Intravenous Every 24 hours 06/12/19 0823     06/11/19 1718  sodium chloride 0.9 % with cefTRIAXone (ROCEPHIN) ADS Med    Note to Pharmacy: Jefm Miles   : cabinet override      06/11/19 1718 06/12/19 0529   06/11/19 1500  cefTRIAXone (ROCEPHIN) 1 g in sodium chloride 0.9 % 100 mL IVPB     1 g 200 mL/hr over 30 Minutes Intravenous  Once 06/11/19 1448 06/11/19 1608        Objective:   Vitals:   06/14/19 0843 06/14/19 1345 06/15/19 0500 06/15/19 1500  BP: 121/74 128/77 127/79 (!) 148/86  Pulse: 70 74 88 78  Resp:  16 18 18   Temp:  97.9 F (36.6 C) 98 F (36.7 C) 98 F (36.7 C)  TempSrc:  Oral Oral Oral  SpO2:  97%  100%  Weight:      Height:        Wt Readings from Last 3 Encounters:  06/11/19 120 kg  05/01/19 122.2 kg  04/30/19 119.7 kg     Intake/Output Summary (Last 24 hours) at 06/15/2019 1553 Last data filed at 06/15/2019 1500 Gross per 24 hour  Intake 1680 ml  Output 600 ml  Net 1080 ml     Physical Exam  Awake Alert, Oriented X 3, No new F.N deficits, Normal affect [place].AT,PERRAL Supple Neck,No JVD, No cervical  lymphadenopathy appriciated.  Symmetrical Chest wall movement, Good air movement bilaterally, CTAB RRR,No Gallops,Rubs or new Murmurs, No Parasternal Heave +ve B.Sounds, Abd Soft, No tenderness, No organomegaly appriciated, No rebound - guarding or rigidity. No Cyanosis, Clubbing or edema, No new Rash or bruise       Data Review:    CBC Recent Labs  Lab 06/11/19 1245 06/12/19 0426 06/13/19 0415 06/14/19 0444 06/15/19 0442  WBC 22.3* 16.4* 9.5 8.2 6.7  HGB 12.1 10.3* 9.9* 10.6* 10.5*  HCT 37.6 32.8* 32.4* 34.9* 34.4*  PLT 369 336 307 309 348  MCV 88.9 91.9 94.5 95.9 93.7  MCH 28.6 28.9 28.9 29.1 28.6  MCHC 32.2 31.4 30.6 30.4 30.5  RDW 16.8* 16.7* 16.8* 16.7* 16.4*  LYMPHSABS 1.3  --  2.3 2.2  --   MONOABS 1.3*  --  0.6 0.7  --   EOSABS 0.0  --  0.5 0.6*  --   BASOSABS 0.0  --  0.1 0.1  --     Chemistries  Recent Labs  Lab 06/11/19 1659 06/12/19 0426 06/13/19 0415 06/14/19 0444 06/14/19 1405 06/15/19 0442  NA  --  133* 135 139 136 137  K  --  4.3 4.1 5.3* 5.0 4.5  CL  --  105 107 110 104 104  CO2  --  23 23 21* 24 23  GLUCOSE  --  110* 153* 157* 143* 171*  BUN  --  20 21* 20 19 20   CREATININE  --  2.16* 2.22* 2.20* 2.30* 2.15*  CALCIUM  --  8.6* 8.7*  8.6* 9.2 9.3  MG 1.7 1.8 2.5*  --   --   --   AST  --   --   --   --   --  11*  ALT  --   --   --   --   --  11  ALKPHOS  --   --   --   --   --  75  BILITOT  --   --   --   --   --  0.2*   ------------------------------------------------------------------------------------------------------------------ No results for input(s): CHOL, HDL, LDLCALC, TRIG, CHOLHDL, LDLDIRECT in the last 72 hours.  Lab Results  Component Value Date   HGBA1C 8.6 (H) 06/11/2019   ------------------------------------------------------------------------------------------------------------------ No results for input(s): TSH, T4TOTAL, T3FREE, THYROIDAB in the last 72 hours.  Invalid input(s):  FREET3 ------------------------------------------------------------------------------------------------------------------ No results for input(s): VITAMINB12, FOLATE, FERRITIN, TIBC, IRON, RETICCTPCT in the last 72 hours.  Coagulation profile Recent Labs  Lab 06/11/19 1305 06/12/19 0426 06/13/19 0415 06/14/19 0852 06/15/19 0442  INR 3.3* 2.9* 2.0* 1.9* 2.1*    No results for input(s): DDIMER in the last 72 hours.  Cardiac Enzymes No results for input(s): CKMB, TROPONINI, MYOGLOBIN in the last 168 hours.  Invalid input(s): CK ------------------------------------------------------------------------------------------------------------------    Component Value Date/Time   BNP 218.9 (H) 07/01/2018 0809    Inpatient Medications  Scheduled Meds:  amLODipine  5 mg Oral Daily   cyanocobalamin  1,000 mcg Subcutaneous Daily   ferrous sulfate  325 mg Oral BID WC   folic acid  1 mg Oral Daily   insulin aspart  0-15 Units Subcutaneous TID WC   insulin aspart  0-5 Units Subcutaneous QHS   insulin glargine  30 Units Subcutaneous QHS   nicotine  14 mg Transdermal Daily   pantoprazole  40 mg Oral Q0600   polyethylene glycol  17 g Oral BID   senna-docusate  1 tablet Oral BID   sertraline  50 mg Oral Daily   warfarin  5 mg Oral ONCE-1800   Warfarin - Pharmacist Dosing Inpatient   Does not apply q1800   Continuous Infusions:  cefTRIAXone (ROCEPHIN)  IV 2 g (06/15/19 1546)   PRN Meds:.acetaminophen, albuterol, bisacodyl, hydrOXYzine, lip balm, loratadine, menthol-cetylpyridinium, morphine injection, oxyCODONE-acetaminophen, prochlorperazine, sodium chloride, sodium phosphate, zolpidem  Micro Results Recent Results (from the past 240 hour(s))  SARS Coronavirus 2 [facility] order, Performed in [facility] hospital lab) Nasopharyngeal Nasopharyngeal Swab     Status: None   Collection Time: 06/11/19  3:06 PM   Specimen: Nasopharyngeal Swab  Result Value Ref Range Status    SARS Coronavirus 2 NEGATIVE NEGATIVE Final    Comment: (NOTE) If result is NEGATIVE SARS-CoV-2 target nucleic acids are NOT DETECTED. The SARS-CoV-2 RNA is generally detectable in upper and lower  respiratory specimens during the acute phase of infection. The lowest  concentration of SARS-CoV-2 viral copies this assay can detect is 250  copies / mL. A negative result does not preclude SARS-CoV-2 infection  and should not be used as the sole basis for treatment or other  patient management decisions.  A negative result may occur with  improper specimen collection / handling, submission of specimen other  than nasopharyngeal swab, presence of viral mutation(s) within the  areas targeted by this assay, and inadequate number of viral copies  (<250 copies / mL). A negative result must be combined with clinical  observations, patient history, and epidemiological information. If result is POSITIVE SARS-CoV-2 target nucleic acids are DETECTED. The  SARS-CoV-2 RNA is generally detectable in upper and lower  respiratory specimens dur ing the acute phase of infection.  Positive  results are indicative of active infection with SARS-CoV-2.  Clinical  correlation with patient history and other diagnostic information is  necessary to determine patient infection status.  Positive results do  not rule out bacterial infection or co-infection with other viruses. If result is PRESUMPTIVE POSTIVE SARS-CoV-2 nucleic acids MAY BE PRESENT.   A presumptive positive result was obtained on the submitted specimen  and confirmed on repeat testing.  While 2019 novel coronavirus  (SARS-CoV-2) nucleic acids may be present in the submitted sample  additional confirmatory testing may be necessary for epidemiological  and / or clinical management purposes  to differentiate between  SARS-CoV-2 and other Sarbecovirus currently known to infect humans.  If clinically indicated additional testing with an alternate test   methodology [id number]) is advised. The SARS-CoV-2 RNA is generally  detectable in upper and lower respiratory sp ecimens during the acute  phase of infection. The expected result is Negative. Fact Sheet for Patients:  StrictlyIdeas.no Fact Sheet for Healthcare Providers: BankingDealers.co.za This test is not yet approved or cleared by the [place] FDA and has been authorized for detection and/or diagnosis of SARS-CoV-2 by FDA under an Emergency Use Authorization (EUA).  This EUA will remain in effect (meaning this test can be used) for the duration of the COVID-19 declaration under Section 564(b)(1) of the Act, 21 U.S.C. section 360bbb-3(b)(1), unless the authorization is terminated or revoked sooner. Performed at [facility], [place] [address], [place], [place] [address]   Culture, Urine     Status: Abnormal   Collection Time: 06/11/19  5:30 PM   Specimen: Cystoscopy; Urine  Result Value Ref Range Status   Specimen Description   Final    CYSTOSCOPY Performed at [place] [address], [place], [place] [address]    Special Requests   Final    NONE Performed at [facility], [place] [address] [place], [place] [address]    Culture 10,000 COLONIES/mL ESCHERICHIA COLI (A)  Final   Report Status 06/14/2019 FINAL  Final   Organism ID, Bacteria ESCHERICHIA COLI (A)  Final      Susceptibility   Escherichia coli - MIC*    AMPICILLIN >=32 RESISTANT Resistant     CEFAZOLIN >=64 RESISTANT Resistant     CEFEPIME <=1 SENSITIVE Sensitive     CEFTAZIDIME <=1 SENSITIVE Sensitive     CEFTRIAXONE <=1 SENSITIVE Sensitive     CIPROFLOXACIN <=0.25 SENSITIVE Sensitive     GENTAMICIN <=1 SENSITIVE Sensitive     IMIPENEM <=0.25 SENSITIVE Sensitive     TRIMETH/SULFA <=20 SENSITIVE Sensitive     AMPICILLIN/SULBACTAM >=32 RESISTANT Resistant     PIP/TAZO 64 INTERMEDIATE Intermediate      Extended ESBL NEGATIVE Sensitive     * 10,000 COLONIES/mL ESCHERICHIA COLI    Radiology Reports Dg C-arm 1-60 Min-no Report  Result Date: 06/11/2019 Fluoroscopy was utilized by the requesting physician.  No radiographic interpretation.   Ct Renal Stone Study  Result Date: 06/11/2019 CLINICAL DATA:  RIGHT flank pain for 3 days, some hematuria per patient, suspected recurrent stone disease; history of kidney stones, diabetes mellitus, hypertension, multiple stone extractions and lithotripsy EXAM: CT ABDOMEN AND PELVIS WITHOUT CONTRAST TECHNIQUE: Multidetector CT imaging of the abdomen and pelvis was performed following the standard protocol without IV contrast. Sagittal and coronal MPR images reconstructed from axial data set. No oral  contrast administered for this indication COMPARISON:  02/07/2019 FINDINGS: Lower chest: Lung bases clear Hepatobiliary: Gallbladder and liver normal appearance Pancreas: Normal appearance Spleen: Normal appearance Adrenals/Urinary Tract: RIGHT adrenal gland normal appearance. LEFT adrenal nodule 2.1 x 1.9 cm, low attenuation consistent with adenoma. Atrophic LEFT kidney with multiple calcifications. Foci of gas are present within LEFT kidney as well, present on prior study as well. No perinephric stranding or hydronephrosis. No definite renal mass. Small amount of gas within urinary bladder question catheterization. Significant RIGHT hydronephrosis and hydroureter secondary to a 5 mm mid to distal RIGHT ureteral calculus image 72. Additional small nonobstructing RIGHT renal calculi. Single focus of air is seen within the collecting system at the upper pole the RIGHT kidney, decreased versus prior study. Significant perinephric stranding of RIGHT kidney with fluid/edema extending into anterior pararenal fascia, posterior pararenal fascia and lateral conal fascia. Remaining ureters and bladder otherwise unremarkable. Stomach/Bowel: Normal appendix. Stomach and bowel loops normal  appearance. Vascular/Lymphatic: Atherosclerotic calcifications aorta and iliac arteries. Wall stent LEFT common iliac artery. Aorta normal caliber. Scattered normal size lymph nodes. Reproductive: Normal appearing uterus and adnexa/ovaries. Other: No free air or free fluid. Musculoskeletal: No acute osseous findings. IMPRESSION: RIGHT hydronephrosis, hydroureter and significant perinephric edema secondary to a 5 mm mid to distal RIGHT ureteral calculus. Additional BILATERAL non-obstructing renal calculi. Foci of gas within both kidneys, stable on LEFT and decreased on RIGHT since prior study, question due to catheterization or prior instrumentation. LEFT adrenal adenoma. Electronically Signed   By: Lavonia Dana M.D.   On: 06/11/2019 14:05    Time Spent in minutes  30   Jani Gravel M.D on 06/15/2019 at 3:53 PM  Between 7am to 7pm - Pager - [phone]  After 7pm go to [email or web address] - password [facility]  [facility] -  Office  [phone]

## 2019-06-15 NOTE — Telephone Encounter (Signed)
Salita returned my call regarding pt. Per Ferne Coe pt was septic so she was unable to have surgery. Per [place] they put a stent in.   I asked Lurena Joiner to speak with her because of the Coumadin because Dr. Wynetta Emery was in a room.

## 2019-06-15 NOTE — Telephone Encounter (Signed)
New Message   Salita with [facility] is calling to request a surgical clearance for the patient to stop her Aspirin and Coumadin 5 days prior to having kidney stone surgery. Please f/u

## 2019-06-15 NOTE — Telephone Encounter (Signed)
Looked in pt chart and pt has been admitted since 06/11/19. I spoke with Dr Wynetta Emery regarding message and per Dr. Wynetta Emery if pt is currently admitted it will be up to the hospital regarding the aspirin and coumadin. As I looked into the chart more it looks like pt had surgery on the 7th as well. [facility] and spoke with Sharl Ma and informed her that i received a call from [place] and I was trying to reach back out to her to make her aware that pt had surgery on the 7th. Per Sharl Ma she has sent Ferne Coe a message regarding pt and if she has any questions or concerns she can reach out to me.

## 2019-06-15 NOTE — Progress Notes (Signed)
ANTICOAGULATION CONSULT NOTE - Initial Consult  Pharmacy Consult for warfarin Indication: hx VTE  Allergies  Allergen Reactions  . Ciprofloxacin Hcl Hives  . Macrobid [Nitrofurantoin Macrocrystal] Hives, Shortness Of Breath and Rash  . Other Hives, Shortness Of Breath and Rash    NO "-CILLINS"!!!  . Penicillins Shortness Of Breath    Had had cephalosporins without incident Has patient had a PCN reaction causing immediate rash, facial/tongue/throat swelling, SOB or lightheadedness with hypotension: Yes Has patient had a PCN reaction causing severe rash involving mucus membranes or skin necrosis: Unk Has patient had a PCN reaction that required hospitalization: Unk Has patient had a PCN reaction occurring within the last 10 years: No If all of the above answers are "NO", then may proceed with Cephalosporin use.   . Sulfa Antibiotics Hives, Shortness Of Breath and Rash  . Dilaudid [Hydromorphone Hcl] Other (See Comments)    Asystole per pt report  . Lexapro [Escitalopram Oxalate] Other (See Comments)    "I just did not like it."    Patient Measurements: Height: 5\' 7"  (170.2 cm) Weight: 264 lb 8.8 oz (120 kg) IBW/kg (Calculated) : 61.6  Vital Signs: Temp: 98 F (36.7 C) (09/11 0500) Temp Source: Oral (09/11 0500) BP: 127/79 (09/11 0500) Pulse Rate: 88 (09/11 0500)  Labs: Recent Labs    06/13/19 0415 06/14/19 0444 06/14/19 0852 06/14/19 1405 06/15/19 0442  HGB 9.9* 10.6*  --   --  10.5*  HCT 32.4* 34.9*  --   --  34.4*  PLT 307 309  --   --  348  LABPROT 22.4*  --  21.6*  --  23.5*  INR 2.0*  --  1.9*  --  2.1*  CREATININE 2.22* 2.20*  --  2.30* 2.15*    Estimated Creatinine Clearance: 44.8 mL/min (A) (by C-G formula based on SCr of 2.15 mg/dL (H)).   Medical History: Past Medical History:  Diagnosis Date  . Anxiety   . Depression   . Diabetes ([place])    Type 2  . GERD (gastroesophageal reflux disease)   . History of blood clots    ,DVT-left leg, and early  2000, had blood clot behind left breast  . History of kidney stones   . Hypercalcemia   . Hyperparathyroidism   . Hypertension    had been on Lisinopril and PCP took her off it 4 months ago  . Iron deficiency anemia   . Left thyroid nodule   . Mass of right ovary   . Nephrolithiasis   . PAOD (peripheral arterial occlusive disease) ([place])   . Peripheral vascular disease ([place])   . Prolonged Q-T interval on ECG 04/19/2019  . Renal calculi   . Tooth loose    top tooth from the right  is loose    Assessment: Patient taking warfarin PTA for history of LLE DVT. Pharmacy consulted to resume warfarin monitor/dose warfarin while inpatient.   PTA dose (confirmed from clinic notes on 05/09/19):  Warfarin 10 mg PO on Tues, Thurs, Sat Warfarin 5 mg PO on all other days of the week  INR on admission = 3.3, slightly elevated  Significant Events: -9/7: Cystoscopy with insertion of right double J stent. Warfarin held that evening  Today, 06/15/19  Hgb 10.5 - slightly low but stable  Plt 348 - WNL  No bleeding documented  No significant drug interactions noted, currently on ceftriaxone  Carb modified diet ordered, patient eating 100% of meals  INR = 2.1 is therapeutic on home  warfarin regimen  Goal of Therapy:  INR 2-3   Plan:   Warfarin 5 mg PO once this evening. Continue home regimen  INR daily while inpatient and on IV antibiotics  Discharge warfarin dose/follow up dependent on if patient discharges on antibiotics with significant interaction with warfarin.  If patient discharges today on cephalosporin, recommend continuation of PTA warfarin regimen with INR check and follow up with anticoagulation clinic on Monday Sept 14th.  Lenis Noon, PharmD 06/15/2019,8:14 AM

## 2019-06-15 NOTE — Telephone Encounter (Signed)
Call received from [facility] requesting clearance to schedule surgery in regards to perioperative anti-coagulation management. [facility] with need a letter faxed from PCP clearing patient to hold ASA and Coumadin five days prior to surgery with appropriate Lovenox bridge. Once the procedure is scheduled, we will contact the patient regarding her bridging schedule.

## 2019-06-16 LAB — PROTIME-INR
INR: 2.3 — ABNORMAL HIGH (ref 0.8–1.2)
Prothrombin Time: 24.6 seconds — ABNORMAL HIGH (ref 11.4–15.2)

## 2019-06-16 MED ORDER — FOLIC ACID 1 MG PO TABS: 1.0000 mg | ORAL_TABLET | Freq: Every day | ORAL | 0 refills | Status: DC

## 2019-06-16 MED ORDER — VITAMIN B-12 1000 MCG PO TABS: 2000.0000 ug | ORAL_TABLET | Freq: Every day | ORAL | 0 refills | Status: DC

## 2019-06-16 MED ORDER — SALINE SPRAY 0.65 % NA SOLN: 1.0000 | NASAL | Status: DC | PRN

## 2019-06-16 MED ORDER — LORATADINE 10 MG PO TABS: 10.0000 mg | ORAL_TABLET | Freq: Every day | ORAL | 0 refills | Status: DC | PRN

## 2019-06-16 MED ORDER — SALINE SPRAY 0.65 % NA SOLN: 1.0000 | NASAL | 0 refills | Status: DC | PRN

## 2019-06-16 MED ORDER — POLYETHYLENE GLYCOL 3350 17 G PO PACK: 17.0000 g | PACK | Freq: Two times a day (BID) | ORAL | 0 refills | Status: DC

## 2019-06-16 MED ORDER — MENTHOL 3 MG MT LOZG: 1.0000 | LOZENGE | OROMUCOSAL | 12 refills | Status: DC | PRN

## 2019-06-16 MED ORDER — HYDROXYZINE HCL 50 MG PO TABS: 50.0000 mg | ORAL_TABLET | Freq: Three times a day (TID) | ORAL | 0 refills | Status: DC | PRN

## 2019-06-16 MED ORDER — SENNOSIDES-DOCUSATE SODIUM 8.6-50 MG PO TABS: 1.0000 | ORAL_TABLET | Freq: Two times a day (BID) | ORAL | 0 refills | Status: DC

## 2019-06-16 MED ORDER — CEFDINIR 300 MG PO CAPS: 300.0000 mg | ORAL_CAPSULE | Freq: Two times a day (BID) | ORAL | 0 refills | Status: DC

## 2019-06-16 MED ORDER — LIP MEDEX EX OINT: TOPICAL_OINTMENT | CUTANEOUS | 0 refills | Status: DC | PRN

## 2019-06-16 MED ORDER — NICOTINE 14 MG/24HR TD PT24: 14.0000 mg | MEDICATED_PATCH | Freq: Every day | TRANSDERMAL | 0 refills | Status: DC

## 2019-06-16 NOTE — Plan of Care (Signed)
  Problem: Clinical Measurements: Goal: Will remain free from infection Outcome: Completed/Met   Problem: Elimination: Goal: Will not experience complications related to bowel motility Outcome: Completed/Met Goal: Will not experience complications related to urinary retention Outcome: Completed/Met   Problem: Pain Managment: Goal: General experience of comfort will improve Outcome: Completed/Met

## 2019-06-16 NOTE — Plan of Care (Signed)
  Problem: Clinical Measurements: Goal: Will remain free from infection Outcome: Completed/Met   Problem: Elimination: Goal: Will not experience complications related to bowel motility Outcome: Completed/Met Goal: Will not experience complications related to urinary retention Outcome: Completed/Met

## 2019-06-16 NOTE — Discharge Summary (Addendum)
Patient ID: Terri Sparks MRN: [id number] DOB/AGE: February 01, 1976 43 y.o.  Admit date: 06/11/2019 Discharge date: 06/16/2019  Primary Care Physician:  Ladell Pier, MD  Discharge Diagnoses:   Present on Admission: . Supratherapeutic INR . Peripheral vascular disease of lower extremity ([name]) . Tobacco abuse . Thromboembolism ([name]) . GERD (gastroesophageal reflux disease) . Essential hypertension . Anxiety and depression . Sepsis ([name]) . Acute pyelonephritis . Iron deficiency anemia due to chronic blood loss . Vitamin B12 deficiency . Folate deficiency . Constipation . Right ureteral stone   Consultants:  Urology: Dr. [name] 06/11/2019  Procedures:  CT renal stone protocol 06/11/2019  Cystoscopy with right retrograde pyelogram and interpretation/cystoscopy with insertion of right double-J stent per Dr. [name] 06/11/2019  Antimicrobials:   IV Rocephin 06/11/2019-> 06/16/2019  Sepsis secondary to right pyelonephritis/right UTI/righthydronephrosis/right hydroureter right mid to distal ureteral stone withsepsis Patient status post cystoscopy with right retrograde pyelogram and interpretation with insertion of right double-J stent per Dr. [name] 06/11/2019.  Urine cultures with 10,000 colonies of E. coli (sensitivities below) Omnicef 325m po bid x 8 days  Insulin-dependent diabetes mellitus type 2 with hyperglycemia, Diabetic neuropathy Hemoglobin A1c of 8.6.  Resume home dose of insulin STOP Gabapentin (note this medication is renally cleared) has not been on during this admission  pcp can initiate Gabapentin at lower dose  Lower extremity DVT On Coumadin. INR 2.3 (06/16/2019) Pt warned that abx can increase INR  Pt told needs INR in 3-5 days  PVD/stent/PCI 2018 Continue aspirin.  Pcp to please initiate statin  Prolonged QTC QT interval of 513. Repeat EKGwith resolution of QTC prolongation.   Chronic kidney disease stage III progressing to chronic kidney  diseasestage IV Stable. Check cmp in 1 -2 week with pcp  Hypertension Resume home regimen of Norvasc 5 mg daily.  Constipation Cont Sennakot and miralax  Hypokalemia resolved Check cmp in 1-2 weeks with pcp  Iron deficiency anemia/vitamin B12 deficiency/folate deficiency Patient with no overt GI bleed. Hemoglobin at 9.9 from10.3 from 12.1 on admission. Likely dilutional. Patient's hemoglobin was 8.5 on 02/22/2019. Patient stated just had her menses.  Anemia panel obtained with a iron level of 12, TIBC of 346, ferritin of 57, folate of 5.9, vitamin B12= 129. Patient on oral iron supplementation which will increase to twice daily. Continue folic acid and vitamin B12 supplementation. Transfusion threshold hemoglobin less than 7.   Check b12, folate, ferritin, iron, tibc in 8 weeks with pcp  Depression/anxiety Cont Wellbutrin Cont Zoloft Check ekg with pcp to check QTC  Tobacco abuse Tobacco cessation stressed to patient. Continue nicotine patch.  Morbid obesity BMI 41.43. Lifestyle changes encouraged.   Discharge Medications: Allergies as of 06/16/2019      Reactions   Ciprofloxacin Hcl Hives   Macrobid [nitrofurantoin Macrocrystal] Hives, Shortness Of Breath, Rash   Other Hives, Shortness Of Breath, Rash   NO "-CILLINS"!!!   Penicillins Shortness Of Breath   Had had cephalosporins without incident Has patient had a PCN reaction causing immediate rash, facial/tongue/throat swelling, SOB or lightheadedness with hypotension: Yes Has patient had a PCN reaction causing severe rash involving mucus membranes or skin necrosis: Unk Has patient had a PCN reaction that required hospitalization: Unk Has patient had a PCN reaction occurring within the last 10 years: No If all of the above answers are "NO", then may proceed with Cephalosporin use.   Sulfa Antibiotics Hives, Shortness Of Breath, Rash   Dilaudid [hydromorphone Hcl] Other (See Comments)   Asystole per  pt  report   Lexapro [escitalopram Oxalate] Other (See Comments)   "I just did not like it."      Medication List    STOP taking these medications   gabapentin 400 MG capsule Commonly known as: NEURONTIN     TAKE these medications   albuterol 108 (90 Base) MCG/ACT inhaler Commonly known as: ProAir HFA Inhale 2 puffs into the lungs every 6 (six) hours as needed for wheezing or shortness of breath.   amLODipine 5 MG tablet Commonly known as: NORVASC Take 1 tablet (5 mg total) by mouth daily.   aspirin EC 81 MG tablet Take 81 mg by mouth daily.   blood glucose meter kit and supplies Dispense based on patient and insurance preference. Use up to four times daily as directed. (FOR ICD-9 250.00, 250.01).   buPROPion 150 MG 12 hr tablet Commonly known as: WELLBUTRIN SR Take 1 tablet (150 mg total) by mouth 2 (two) times daily.   cefdinir 300 MG capsule Commonly known as: OMNICEF Take 1 capsule (300 mg total) by mouth 2 (two) times daily.   ferrous sulfate 325 (65 FE) MG tablet Take 1 tablet (325 mg total) by mouth daily with breakfast.   folic acid 1 MG tablet Commonly known as: FOLVITE Take 1 tablet (1 mg total) by mouth daily.   glucose blood test strip Use as instructed   hydrOXYzine 50 MG tablet Commonly known as: ATARAX/VISTARIL Take 1 tablet (50 mg total) by mouth 3 (three) times daily as needed for itching or anxiety. What changed:   medication strength  See the new instructions.   insulin aspart 100 UNIT/ML injection Commonly known as: novoLOG Inject 5-15 Units into the skin 3 (three) times daily before meals.   insulin glargine 100 UNIT/ML injection Commonly known as: LANTUS Inject 73 Units into the skin at bedtime.   Insulin Pen Needle 31G X 5 MM Misc Use with Lantus and Novolog injections.   Insulin Syringe-Needle U-100 31G X 5/16" 0.5 ML Misc Commonly known as: BD Insulin Syringe Ultrafine Use as directed   lip balm ointment Apply topically as  needed for lip care.   loratadine 10 MG tablet Commonly known as: CLARITIN Take 1 tablet (10 mg total) by mouth daily as needed for allergies.   menthol-cetylpyridinium 3 MG lozenge Commonly known as: CEPACOL Take 1 lozenge (3 mg total) by mouth as needed for sore throat.   nicotine 14 mg/24hr patch Commonly known as: NICODERM CQ - dosed in mg/24 hours Place 1 patch (14 mg total) onto the skin daily.   omeprazole 20 MG capsule Commonly known as: PRILOSEC Take 2 capsules (40 mg total) by mouth daily. What changed:   how much to take  when to take this   ondansetron 4 MG tablet Commonly known as: Zofran Take 1 tablet (4 mg total) by mouth daily as needed for nausea or vomiting.   oxyCODONE-acetaminophen 5-325 MG tablet Commonly known as: Percocet Take 1 tablet by mouth every 4 (four) hours as needed for severe pain.   polyethylene glycol 17 g packet Commonly known as: MIRALAX / GLYCOLAX Take 17 g by mouth 2 (two) times daily.   senna-docusate 8.6-50 MG tablet Commonly known as: Senokot-S Take 1 tablet by mouth 2 (two) times daily.   sertraline 50 MG tablet Commonly known as: Zoloft Take 1 tablet (50 mg total) by mouth daily. 1/2 tab daily x 2 wks then 1 tab PO daily What changed: additional instructions   sodium chloride 0.65 % Soln  nasal spray Commonly known as: OCEAN Place 1 spray into both nostrils as needed for congestion (nose irritation).   True Metrix Meter w/Device Kit Use as directed   TRUEplus Lancets 28G Misc Use as directed   vitamin B-12 1000 MCG tablet Commonly known as: CYANOCOBALAMIN Take 2 tablets (2,000 mcg total) by mouth daily.   warfarin 5 MG tablet Commonly known as: COUMADIN Take as directed. If you are unsure how to take this medication, talk to your nurse or doctor. Original instructions: TAKE 2 TABLETS ON MONDAY, WEDNESDAY AND FRIDAY WITH 1 TABLET ALL OTHER DAYS. What changed:   how much to take  how to take this  when to  take this  additional instructions        Brief H and P:  Patient is a 43 year old female history of insulin-dependent diabetes mellitus type 2, anxiety, depression, left lower extremity DVT, history of hyperparathyroidism/hypercalcemia, hypertension, chronic kidney disease stage III, PVD/stent/PCI 2018, prolonged QT and recurrent kidney stones presented with worsening right flank pain. Patient also noted with nausea and emesis with decreased oral intake. Patient with complaints of constipation. CT renal stone protocol which was done showed right hydronephrosis, hydroureter and significant perinephric edema secondary to 5 mm mid to distal right ureteral calculus and bilateral nonobstructing renal calculi and chronic foci of gas within both kidneys. Urology was consulted and patient subsequently underwent urgent cystoscopy and right double-J stent insertion per urology on 06/11/2019. Urine cultures obtained and pending. Patient placed empirically on IV Rocephin. Hospital Course:  Pt was admitted for sepsis secondary to pyelonephritis and right hydroureter secondaryt distal right ureteral stone. Pt was started on rocephin 1gm iv qday.  Double J stent by Dr. [name] on 06/11/2019.  Urine culture 06/11/2019-> E. Coli    Susceptibility   Escherichia coli    MIC    AMPICILLIN >=32 RESIST... Resistant    AMPICILLIN/SULBACTAM >=32 RESIST... Resistant    CEFAZOLIN >=64 RESIST... Resistant    CEFEPIME <=1 SENSITIVE  Sensitive    CEFTAZIDIME <=1 SENSITIVE  Sensitive    CEFTRIAXONE <=1 SENSITIVE  Sensitive    CIPROFLOXACIN <=0.25 SENS... Sensitive    Extended ESBL NEGATIVE  Sensitive    GENTAMICIN <=1 SENSITIVE  Sensitive    IMIPENEM <=0.25 SENS... Sensitive    PIP/TAZO 64 INTERMED... Intermediate    TRIMETH/SULFA <=20 SENSIT... Sensitive      Pt is allerghic to sulfa, cipro and pcn.  Pt has been tolerating rocephin thankfully.  Pt has been afebrile for the past 48 hours.   Pharmacy suggested that  she be transitioned to [facility].    Principal Problem:   Sepsis ([place]) Active Problems:   GERD (gastroesophageal reflux disease)   Peripheral vascular disease of lower extremity ([facility])   Tobacco abuse   Diabetes mellitus, type II ([facility])   Anxiety and depression   Thromboembolism ([facility])   Right ureteral stone   Essential hypertension   Supratherapeutic INR   Ureteral stone with hydronephrosis   Acute pyelonephritis   Iron deficiency anemia due to chronic blood loss   Vitamin B12 deficiency   Folate deficiency   Constipation   AKI (acute kidney injury) ([facility])   Prolonged QT interval   Urinary tract infection without hematuria   Day of Discharge BP 120/82 (BP Location: Right Arm)   Pulse 77   Temp 98.2 F (36.8 C) (Oral)   Resp 20   Ht _0  (1.702 m)   Wt 120 kg   SpO2 97%   BMI 41.43  kg/m   Physical Exam: General: Alert and awake oriented x3 not in any acute distress. HEENT: anicteric sclera, pupils reactive to light and accommodation CVS: S1-S2 clear no murmur rubs or gallops Chest: clear to auscultation bilaterally, no wheezing rales or rhonchi Abdomen: soft nontender, nondistended, normal bowel sounds, no organomegaly Extremities: no cyanosis, clubbing or edema noted bilaterally Neuro: Cranial nerves II-XII intact, no focal neurological deficits No cva tenderness today.   The results of significant diagnostics from this hospitalization (including imaging, microbiology, ancillary and laboratory) are listed below for reference.    LAB RESULTS: Basic Metabolic Panel: Recent Labs  Lab 06/13/19 0415  06/14/19 1405 06/15/19 0442  NA 135   < > 136 137  K 4.1   < > 5.0 4.5  CL 107   < > 104 104  CO2 23   < > 24 23  GLUCOSE 153*   < > 143* 171*  BUN 21*   < > 19 20  CREATININE 2.22*   < > 2.30* 2.15*  CALCIUM 8.7*   < > 9.2 9.3  MG 2.5*  --   --   --    < > = values in this interval not displayed.   Liver Function Tests: Recent Labs  Lab 06/15/19 0442  AST  11*  ALT 11  ALKPHOS 75  BILITOT 0.2*  PROT 6.6  ALBUMIN 3.1*   No results for input(s): LIPASE, AMYLASE in the last 168 hours. No results for input(s): AMMONIA in the last 168 hours. CBC: Recent Labs  Lab 06/14/19 0444 06/15/19 0442  WBC 8.2 6.7  NEUTROABS 4.7  --   HGB 10.6* 10.5*  HCT 34.9* 34.4*  MCV 95.9 93.7  PLT 309 348   Cardiac Enzymes: No results for input(s): CKTOTAL, CKMB, CKMBINDEX, TROPONINI in the last 168 hours. BNP: Invalid input(s): POCBNP CBG: Recent Labs  Lab 06/15/19 2029 06/15/19 2331  GLUCAP 139* 183*    Significant Diagnostic Studies:  Dg C-arm 1-60 Min-no Report  Result Date: 06/11/2019 Fluoroscopy was utilized by the requesting physician.  No radiographic interpretation.   Ct Renal Stone Study  Result Date: 06/11/2019 CLINICAL DATA:  RIGHT flank pain for 3 days, some hematuria per patient, suspected recurrent stone disease; history of kidney stones, diabetes mellitus, hypertension, multiple stone extractions and lithotripsy EXAM: CT ABDOMEN AND PELVIS WITHOUT CONTRAST TECHNIQUE: Multidetector CT imaging of the abdomen and pelvis was performed following the standard protocol without IV contrast. Sagittal and coronal MPR images reconstructed from axial data set. No oral contrast administered for this indication COMPARISON:  02/07/2019 FINDINGS: Lower chest: Lung bases clear Hepatobiliary: Gallbladder and liver normal appearance Pancreas: Normal appearance Spleen: Normal appearance Adrenals/Urinary Tract: RIGHT adrenal gland normal appearance. LEFT adrenal nodule 2.1 x 1.9 cm, low attenuation consistent with adenoma. Atrophic LEFT kidney with multiple calcifications. Foci of gas are present within LEFT kidney as well, present on prior study as well. No perinephric stranding or hydronephrosis. No definite renal mass. Small amount of gas within urinary bladder question catheterization. Significant RIGHT hydronephrosis and hydroureter secondary to a 5 mm mid  to distal RIGHT ureteral calculus image 72. Additional small nonobstructing RIGHT renal calculi. Single focus of air is seen within the collecting system at the upper pole the RIGHT kidney, decreased versus prior study. Significant perinephric stranding of RIGHT kidney with fluid/edema extending into anterior pararenal fascia, posterior pararenal fascia and lateral conal fascia. Remaining ureters and bladder otherwise unremarkable. Stomach/Bowel: Normal appendix. Stomach and bowel loops normal appearance. Vascular/Lymphatic:  Atherosclerotic calcifications aorta and iliac arteries. Wall stent LEFT common iliac artery. Aorta normal caliber. Scattered normal size lymph nodes. Reproductive: Normal appearing uterus and adnexa/ovaries. Other: No free air or free fluid. Musculoskeletal: No acute osseous findings. IMPRESSION: RIGHT hydronephrosis, hydroureter and significant perinephric edema secondary to a 5 mm mid to distal RIGHT ureteral calculus. Additional BILATERAL non-obstructing renal calculi. Foci of gas within both kidneys, stable on LEFT and decreased on RIGHT since prior study, question due to catheterization or prior instrumentation. LEFT adrenal adenoma. Electronically Signed   By: Lavonia Dana M.D.   On: 06/11/2019 14:05     Disposition and Follow-up: PCP in 3-5 days for INR   DISPOSITION:   home  DIET:  Diabetic, heart healthy  ACTIVITY:  As tolerated  TESTS THAT NEED FOLLOW-UP INR, cbc, cmp , b12, folate, ferritin, iron, tibc,   DISCHARGE FOLLOW-UP Follow-up Information    [facility].   Why: The office will call to schedule the follow up procedure for stone removal.  Contact information: [place]       Ladell Pier, MD Follow up in 5 day(s).   Specialty: Internal Medicine Contact information: [place] [address] [phone]           Time spent on  Discharge: 35  Signed:   Jani Gravel M.D. [facility] 06/16/2019, 8:41 AM Pager: [phone]    If 7PM-7AM, please contact night-coverage [email or web address] Password [email or web address]

## 2019-06-18 ENCOUNTER — Other Ambulatory Visit: Payer: Self-pay

## 2019-06-18 ENCOUNTER — Ambulatory Visit: Payer: Self-pay | Attending: Family Medicine | Admitting: Pharmacist

## 2019-06-18 DIAGNOSIS — I745 Embolism and thrombosis of iliac artery: Secondary | ICD-10-CM

## 2019-06-18 DIAGNOSIS — I749 Embolism and thrombosis of unspecified artery: Secondary | ICD-10-CM

## 2019-06-18 LAB — POCT INR: INR: 1.3 — AB (ref 2.0–3.0)

## 2019-06-18 LAB — GLUCOSE, CAPILLARY: Glucose-Capillary: 213 mg/dL — ABNORMAL HIGH (ref 70–99)

## 2019-06-18 MED FILL — ?FOLIC ACID 1MG TAB: 1 | 30 days supply | Qty: 30 | Fill #0

## 2019-06-18 MED FILL — HYDROXYZINE PAM 50 MG CAP: 50 | 30 days supply | Qty: 90 | Fill #0

## 2019-06-18 MED FILL — CEFDINIR 300 MG CAPSULE: 300 | 8 days supply | Qty: 16 | Fill #0

## 2019-06-18 NOTE — Telephone Encounter (Signed)
Faxed letter to [facility]

## 2019-06-19 ENCOUNTER — Telehealth: Payer: Self-pay | Admitting: Pharmacist

## 2019-06-19 ENCOUNTER — Other Ambulatory Visit: Payer: Self-pay | Admitting: Urology

## 2019-06-19 NOTE — Addendum Note (Signed)
Addended by: Ellison Hughs A on: 06/19/2019 05:30 PM   Modules accepted: Orders

## 2019-06-19 NOTE — Telephone Encounter (Signed)
Received call from pt regarding perioperative bridging of anticoag. Pt is scheduled for cystoscopy/stent exchange on 06/27/19. We have bridged her before for similar procedures.   Pt was instructed on the following:   Day -5: 9/18 - no warfarin. No ASA.    Day -4: 9/19 - no warfarin. No ASA.  Day -3: 9/20 - no warfarin. No ASA, begin therapeutic Lovenox at 1 mg/kg BID. Pt has Lovenox at home.   Day -2: 9/21 - no warfarin. No ASA; continue therapeutic Lovenox at 1 mg/kg BID. Pt has Lovenox at home.   Day -1: 9/22 - no warfarin. No ASA; take morning Lovenox at 1 mg/kg in the morning ~7 AM. NO PM LOVENOX.  Day 0, procedure day - no anticoagulation.   Day 1, post-procedure - consider restart of ASA; warfarin + therapeutic Lovenox at 1 mg/kg BID as deemed stable to do so by the proceduralist  I will see her on 9/22 to check her INR (goal is to be <1.5).   Benard Halsted, PharmD, [place] [phone]

## 2019-06-22 NOTE — Telephone Encounter (Signed)
Noted  

## 2019-06-23 ENCOUNTER — Other Ambulatory Visit (HOSPITAL_COMMUNITY)
Admission: RE | Admit: 2019-06-23 | Discharge: 2019-06-23 | Disposition: A | Discharge status: Home / Self Care | Payer: Medicaid Other | Source: Ambulatory Visit | Attending: Urology | Admitting: Urology

## 2019-06-23 DIAGNOSIS — N201 Calculus of ureter: Secondary | ICD-10-CM | POA: Insufficient documentation

## 2019-06-23 DIAGNOSIS — Z01812 Encounter for preprocedural laboratory examination: Secondary | ICD-10-CM | POA: Diagnosis not present

## 2019-06-23 DIAGNOSIS — Z20828 Contact with and (suspected) exposure to other viral communicable diseases: Secondary | ICD-10-CM | POA: Insufficient documentation

## 2019-06-24 LAB — NOVEL CORONAVIRUS, NAA (HOSP ORDER, SEND-OUT TO REF LAB; TAT 18-24 HRS): SARS-CoV-2, NAA: NOT DETECTED

## 2019-06-25 IMAGING — US US PELVIS COMPLETE TRANSABD/TRANSVAG
1 series · 14 of 25 positions shown · non-contrast
Comparison: CTA abdomen/pelvis dated 05/12/2018

CLINICAL DATA: Right ovarian mass

EXAM:
TRANSABDOMINAL AND TRANSVAGINAL ULTRASOUND OF PELVIS
TECHNIQUE: Both transabdominal and transvaginal ultrasound examinations of the
pelvis were performed. Transabdominal technique was performed for
global imaging of the pelvis including uterus, ovaries, adnexal
regions, and pelvic cul-de-sac. It was necessary to proceed with
endovaginal exam following the transabdominal exam to visualize the
endometrium and bilateral ovaries.

[Series 1: us pelvis complete transabd/transvag · 0.26mm/px · 14 of 76 slices shown]
[im 1/76]
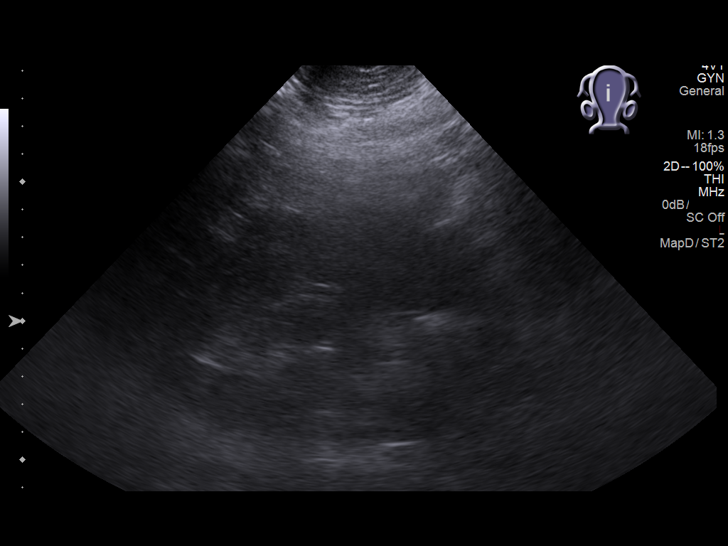
[im 7/76]
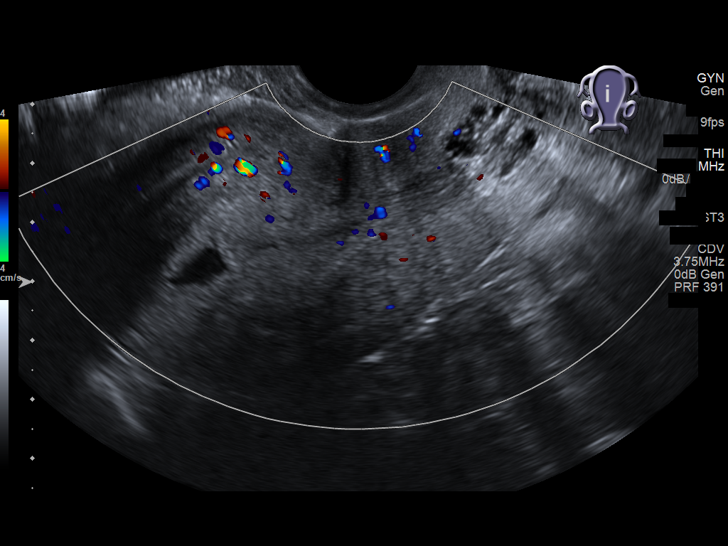
[im 13/76]
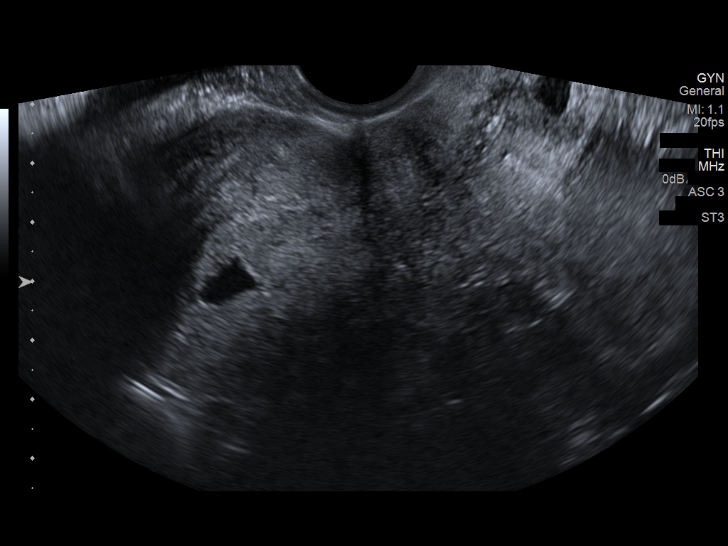
[im 19/76]
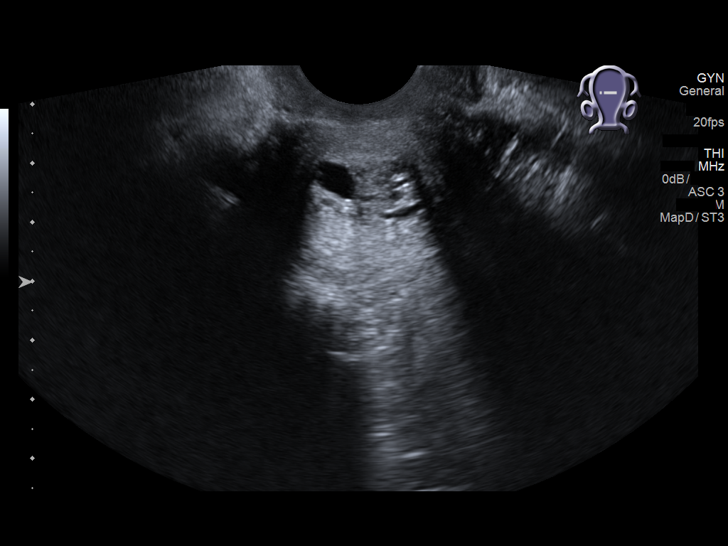
[im 26/76]
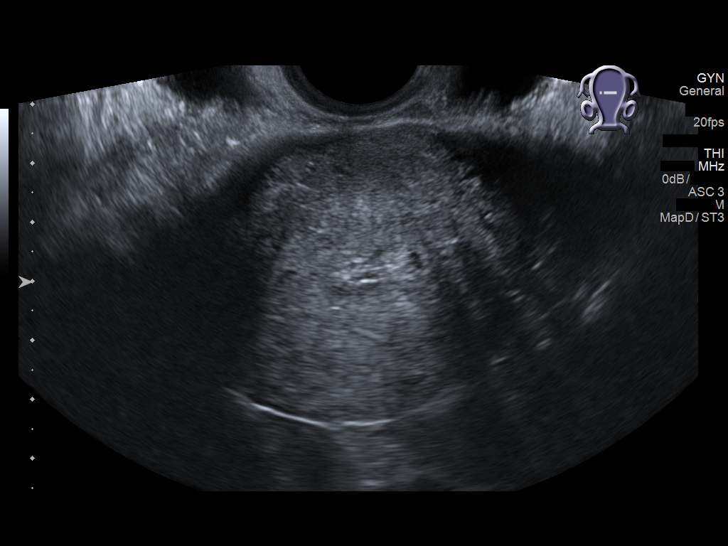
[im 29/76]
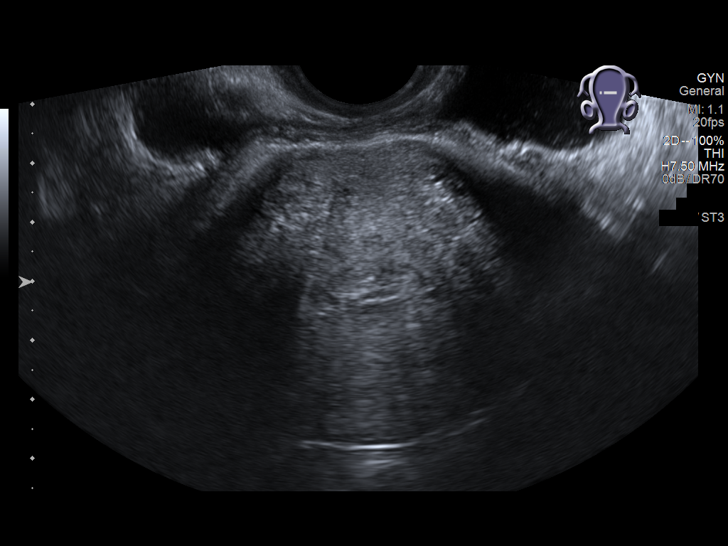
[im 35/76]
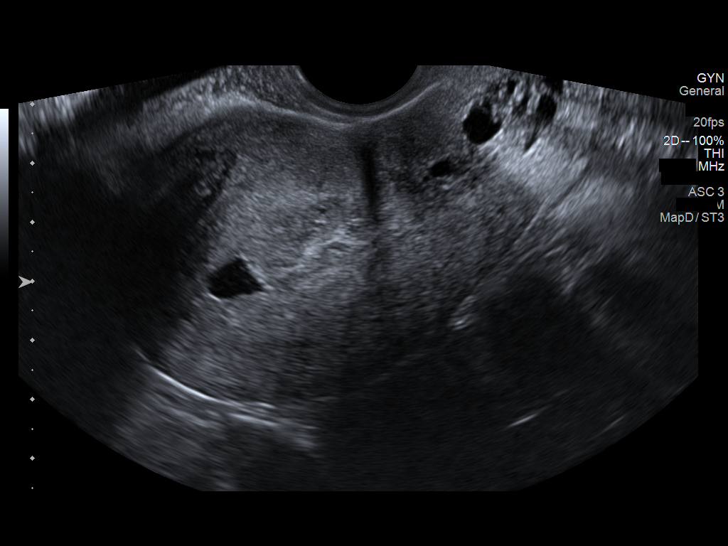
[im 41/76]
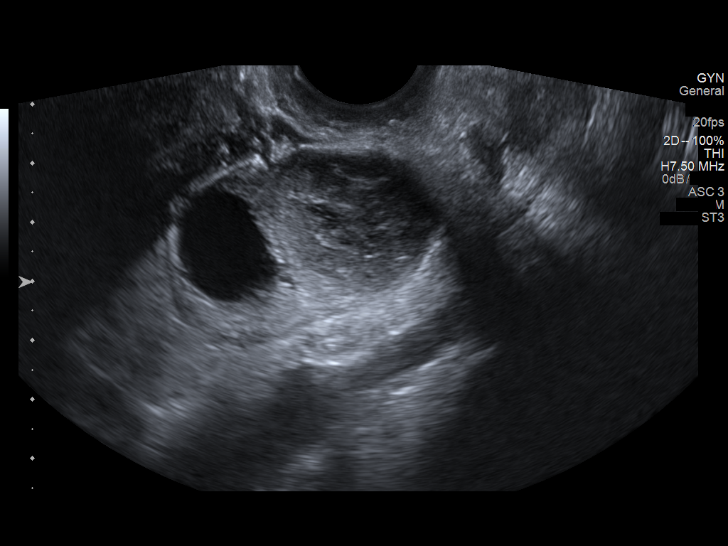
[im 47/76]
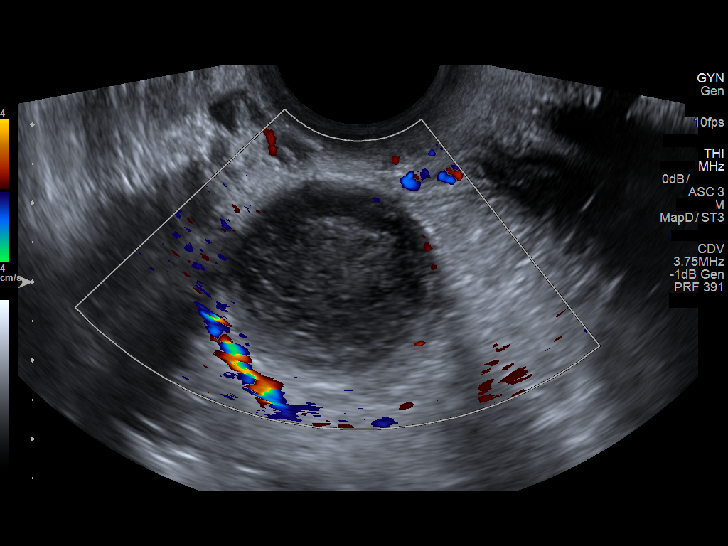
[im 51/76]
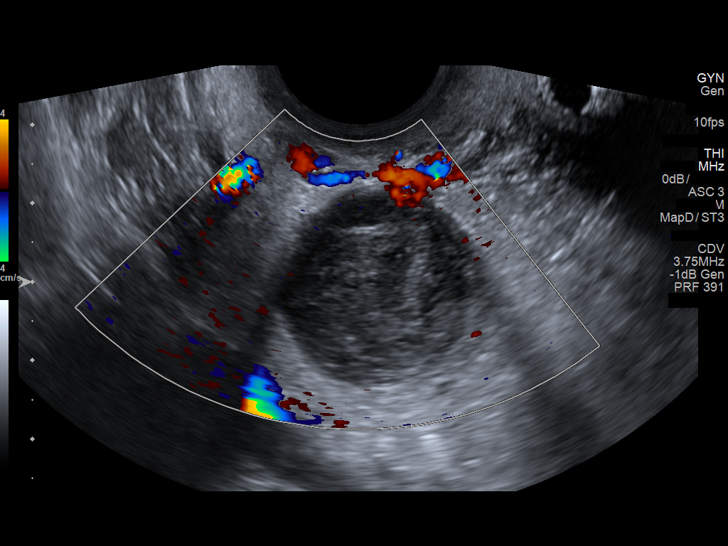
[im 57/76]
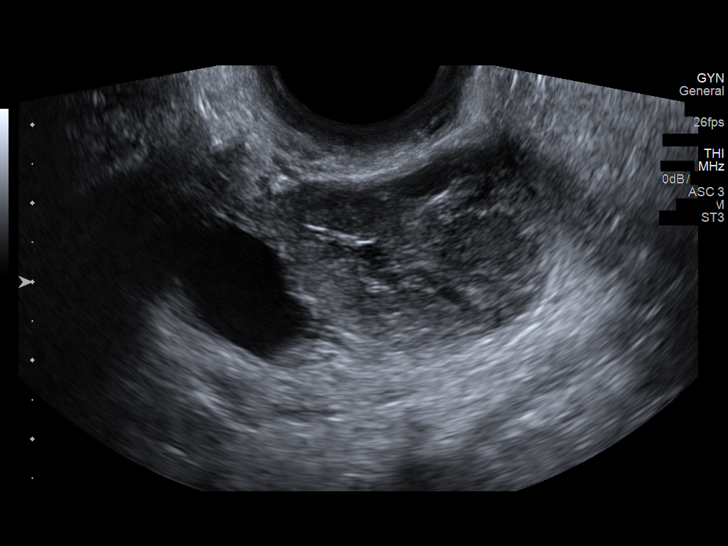
[im 63/76]
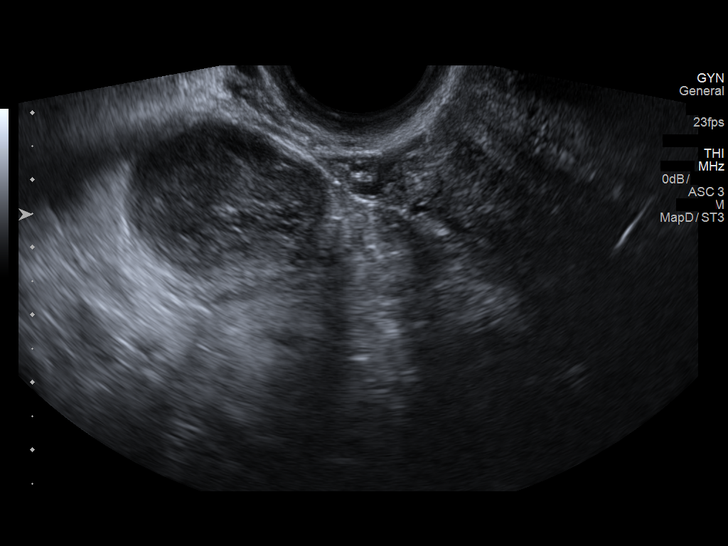
[im 69/76]
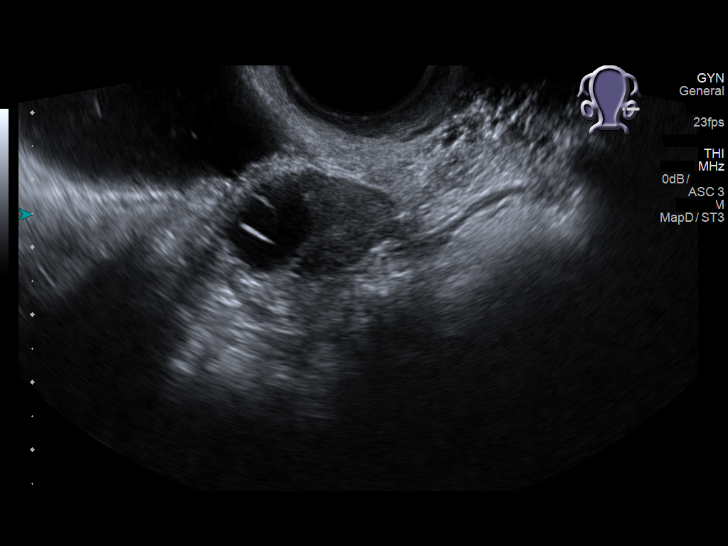
[im 76/76]
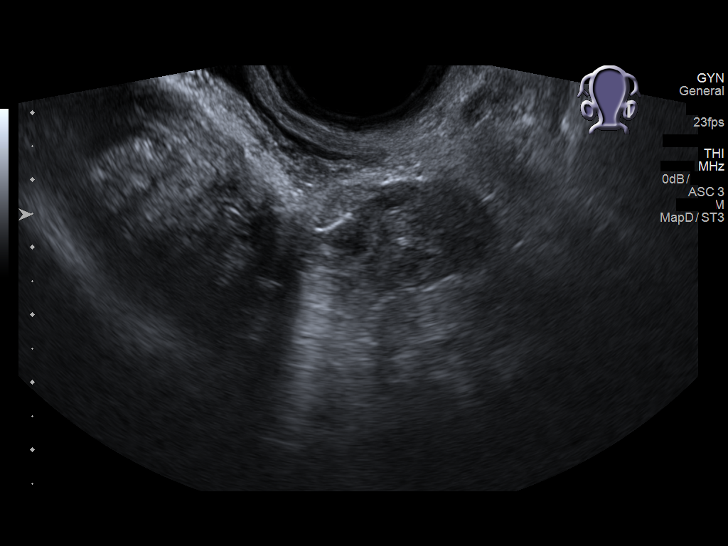

[14 of 25 positions shown; findings below may reference images not displayed]

FINDINGS: Uterus

Measurements: 8.7 x 4.7 x 5.6 cm. No fibroids or other mass
visualized.

Endometrium

Thickness: 7 mm.  Trace fluid in the uterine fundus.

Right ovary

Measurements: 4.9 x 2.8 x 2.8 cm. Dominant 2.7 x 2.2 x 2.7 cm
complex/hemorrhagic cyst, less likely endometrioma. Additional 1.9 x
1.4 x 2.0 cm simple cyst/follicle, physiologic.

Left ovary

Measurements: 2.7 x 1.7 x 2.7 cm. Normal appearance/no adnexal mass.

Other findings

No abnormal free fluid.
IMPRESSION: 2.7 cm complex/hemorrhagic right ovarian cyst, less likely
endometrioma. Consider follow-up pelvic ultrasound in 6-12 weeks.

## 2019-06-25 NOTE — Patient Instructions (Addendum)
DUE TO COVID-19 ONLY ONE VISITOR IS ALLOWED TO COME WITH YOU AND STAY IN THE WAITING ROOM ONLY DURING PRE OP AND PROCEDURE DAY OF SURGERY. THE 1 VISITOR MAY VISIT WITH YOU AFTER SURGERY IN YOUR PRIVATE ROOM DURING VISITING HOURS ONLY!   ONCE YOUR COVID TEST IS COMPLETED, PLEASE BEGIN THE QUARANTINE INSTRUCTIONS AS OUTLINED IN YOUR HANDOUT.                Terri Sparks    Your procedure is scheduled on: Wednesday 06/27/19   Report to [facility] Main  Entrance   Report to admitting at    7:15 AM     Call this number if you have problems the morning of surgery [phone]    Remember: Do not eat food or drink liquids :After Midnight.  BRUSH YOUR TEETH MORNING OF SURGERY AND RINSE YOUR MOUTH OUT, NO CHEWING GUM CANDY OR MINTS.     Take these medicines the morning of surgery with A SIP OF WATER: Wellbutrin, Zoloft, Amlodipine. And use your inhaler if needed and bring it to the hospital with you   How to Manage Your Diabetes Before and After Surgery  Why is it important to control my blood sugar before and after surgery? . Improving blood sugar levels before and after surgery helps healing and can limit problems. . A way of improving blood sugar control is eating a healthy diet by: o  Eating less sugar and carbohydrates o  Increasing activity/exercise o  Talking with your doctor about reaching your blood sugar goals . High blood sugars (greater than 180 mg/dL) can raise your risk of infections and slow your recovery, so you will need to focus on controlling your diabetes during the weeks before surgery. . Make sure that the doctor who takes care of your diabetes knows about your planned surgery including the date and location.  How do I manage my blood sugar before surgery? . Check your blood sugar at least 4 times a day, starting 2 days before surgery, to make sure that the level is not too high or low. o Check your blood sugar the morning of your surgery when you wake up  and every 2 hours until you get to the Short Stay unit. . If your blood sugar is less than 70 mg/dL, you will need to treat for low blood sugar: o Do not take insulin. o Treat a low blood sugar (less than 70 mg/dL) with  cup of clear juice (cranberry or apple), 4 glucose tablets, OR glucose gel. o Recheck blood sugar in 15 minutes after treatment (to make sure it is greater than 70 mg/dL). If your blood sugar is not greater than 70 mg/dL on recheck, call [phone] for further instructions. . Report your blood sugar to the short stay nurse when you get to Short Stay.  . If you are admitted to the hospital after surgery: o Your blood sugar will be checked by the staff and you will probably be given insulin after surgery (instead of oral diabetes medicines) to make sure you have good blood sugar levels. o The goal for blood sugar control after surgery is 80-180 mg/dL.   WHAT DO I DO ABOUT MY DIABETES MEDICATION?  Marland Kitchen Do not take oral diabetes medicines (pills) the morning of surgery.  . THE NIGHT BEFORE SURGERY, take  35   units of Glarine Lantus      insulin. (50% of normal dose)      . THE MORNING  OF SURGERY, take 0   units of    insulin.  . The day of surgery, do not take other diabetes injectables, including Byetta (exenatide), Bydureon (exenatide ER), Victoza (liraglutide), or Trulicity (dulaglutide).  . If your CBG is greater than 220 mg/dL, you may take  of your sliding scale  . (correction) dose of insulin.                                     You may not have any metal on your body including hair pins and              piercings               Do not wear jewelry, make-up, lotions, powders or perfumes, deodorant             Do not wear nail polish.  Do not shave  48 hours prior to surgery.      Do not bring valuables to the hospital. [place].  Contacts, dentures or bridgework may not be worn into surgery.  Leave suitcase  in the car. After surgery it may be brought to your room.     Patients discharged the day of surgery will not be allowed to drive home.  IF YOU ARE HAVING SURGERY AND GOING HOME THE SAME DAY, YOU MUST HAVE AN ADULT TO DRIVE YOU HOME AND BE WITH YOU FOR 24 HOURS . YOU MAY GO HOME BY TAXI OR UBER OR ORTHERWISE, BUT AN ADULT MUST ACCOMPANY YOU HOME AND STAY WITH YOU FOR 24 HOURS.  Name and phone number of your driver:  Special Instructions: N/A              Please read over the following fact sheets you were given: _____________________________________________________________________             [facility] - Preparing for Surgery Before surgery, you can play an important role.   Because skin is not sterile, your skin needs to be as free of germs as possible.   You can reduce the number of germs on your skin by washing with CHG (chlorahexidine gluconate) soap before surgery   CHG is an antiseptic cleaner which kills germs and bonds with the skin to continue killing germs even after washing. Please DO NOT use if you have an allergy to CHG or antibacterial soaps.   If your skin becomes reddened/irritated stop using the CHG and inform your nurse when you arrive at Short Stay. Do not shave (including legs and underarms) for at least 48 hours prior to the first CHG shower.   . Please follow these instructions carefully:  1.  Shower with CHG Soap the night before surgery and the  morning of Surgery.  2.  If you choose to wash your hair, wash your hair first as usual with your  normal  shampoo.  3.  After you shampoo, rinse your hair and body thoroughly to remove the  shampoo.                                        4.  Use CHG as you would any other liquid soap.  You can apply chg directly  to the skin and wash                       Gently with a scrungie or clean washcloth.  5.  Apply the CHG Soap to your body ONLY FROM THE NECK DOWN.   Do not use on face/ open                           Wound  or open sores. Avoid contact with eyes, ears mouth and genitals (private parts).                       Wash face,  Genitals (private parts) with your normal soap.             6.  Wash thoroughly, paying special attention to the area where your surgery  will be performed.  7.  Thoroughly rinse your body with warm water from the neck down.  8.  DO NOT shower/wash with your normal soap after using and rinsing off  the CHG Soap.                9.  Pat yourself dry with a clean towel.            10.  Wear clean pajamas.            11.  Place clean sheets on your bed the night of your first shower and do not  sleep with pets. Day of Surgery : Do not apply any lotions/deodorants the morning of surgery.  Please wear clean clothes to the hospital/surgery center.  FAILURE TO FOLLOW THESE INSTRUCTIONS MAY RESULT IN THE CANCELLATION OF YOUR SURGERY PATIENT SIGNATURE_________________________________  NURSE SIGNATURE__________________________________  ________________________________________________________________________

## 2019-06-26 ENCOUNTER — Encounter (HOSPITAL_COMMUNITY)
Admission: RE | Admit: 2019-06-26 | Discharge: 2019-06-26 | Disposition: A | Discharge status: Home / Self Care | Payer: Medicaid Other | Source: Ambulatory Visit | Attending: Urology | Admitting: Urology

## 2019-06-26 ENCOUNTER — Other Ambulatory Visit: Payer: Self-pay

## 2019-06-26 ENCOUNTER — Encounter (HOSPITAL_COMMUNITY): Payer: Self-pay

## 2019-06-26 DIAGNOSIS — N211 Calculus in urethra: Secondary | ICD-10-CM | POA: Diagnosis not present

## 2019-06-26 DIAGNOSIS — Z01812 Encounter for preprocedural laboratory examination: Secondary | ICD-10-CM | POA: Diagnosis not present

## 2019-06-26 LAB — BASIC METABOLIC PANEL
Anion gap: 8 (ref 5–15)
BUN: 20 mg/dL (ref 6–20)
CO2: 22 mmol/L (ref 22–32)
Calcium: 9 mg/dL (ref 8.9–10.3)
Chloride: 106 mmol/L (ref 98–111)
Creatinine, Ser: 1.98 mg/dL — ABNORMAL HIGH (ref 0.44–1.00)
GFR calc Af Amer: 35 mL/min — ABNORMAL LOW (ref >60)
GFR calc non Af Amer: 30 mL/min — ABNORMAL LOW (ref >60)
Glucose, Bld: 183 mg/dL — ABNORMAL HIGH (ref 70–99)
Potassium: 5 mmol/L (ref 3.5–5.1)
Sodium: 136 mmol/L (ref 135–145)

## 2019-06-26 LAB — SURGICAL PCR SCREEN
MRSA, PCR: NEGATIVE
Staphylococcus aureus: NEGATIVE

## 2019-06-26 LAB — PROTIME-INR
INR: 1.2 (ref 0.8–1.2)
Prothrombin Time: 14.6 seconds (ref 11.4–15.2)

## 2019-06-26 LAB — GLUCOSE, CAPILLARY: Glucose-Capillary: 184 mg/dL — ABNORMAL HIGH (ref 70–99)

## 2019-06-26 MED ORDER — GENTAMICIN SULFATE 40 MG/ML IJ SOLN
5.0000 mg/kg | Freq: Once | INTRAVENOUS | Status: AC
  Administered 2019-06-27: 430 mg via INTRAVENOUS
  Filled 2019-06-26 (×2): qty 10.75

## 2019-06-26 NOTE — Progress Notes (Signed)
PCP - Dr. Etheleen Mayhew Cardiologist - none  Chest x-ray - no EKG - 06/12/19 Stress Test - no ECHO - 06/26/18 Cardiac Cath - no  Sleep Study - yes.negative CPAP - no  Fasting Blood Sugar - not sure  120s Checks Blood Sugar 3_____ times a day  Blood Thinner Instructions: ASA and Coumadin. She is bridging to lovenox. Last dose of coumadin was 06/22/19. First dose of Lovenox was 06/24/19. Last dose was 9/22 then hold for surgery Aspirin Instructions:Instructions were given by Lurena Joiner at the wellness center Last Dose:06/25/19.  Anesthesia review:   Patient denies shortness of breath, fever, cough and chest pain at PAT appointment yes  Patient verbalized understanding of instructions that were given to them at the PAT appointment. Patient was also instructed that they will need to review over the PAT instructions again at home before surgery. Yes Pt stated that she has peripheral edema Lt leg more than Rt that started about a week ago after getting out of the hospital. Konrad Felix PA examined the Pt at PAT visit

## 2019-06-26 NOTE — Anesthesia Preprocedure Evaluation (Addendum)
Anesthesia Evaluation  Patient identified by MRN, date of birth, ID band Patient awake    Reviewed: Allergy & Precautions, NPO status , Patient's Chart, lab work & pertinent test results  History of Anesthesia Complications Negative for: history of anesthetic complications  Airway Mallampati: II  TM Distance: >3 FB Neck ROM: Full    Dental  (+) Missing,    Pulmonary Current Smoker and Patient abstained from smoking.,    Pulmonary exam normal        Cardiovascular hypertension, + Peripheral Vascular Disease and + DVT (2000, on Coumadin)  Normal cardiovascular exam  QTc 482   Neuro/Psych Anxiety Depression negative neurological ROS  negative psych ROS   GI/Hepatic GERD  Controlled,(+)     substance abuse  marijuana use,   Endo/Other  diabetes, Type 2, Insulin DependentMorbid obesity  Renal/GU Renal Insufficiency and ARFRenal disease (Renal stones, Cr 1.98)  negative genitourinary   Musculoskeletal negative musculoskeletal ROS (+)   Abdominal   Peds  Hematology  (+) anemia , Hgb 10.5   Anesthesia Other Findings Day of surgery medications reviewed with patient.  Reproductive/Obstetrics negative OB ROS                           Anesthesia Physical Anesthesia Plan  ASA: III  Anesthesia Plan: General   Post-op Pain Management:    Induction: Intravenous  PONV Risk Score and Plan: 3 and Treatment may vary due to age or medical condition, Midazolam, Ondansetron and Dexamethasone  Airway Management Planned: LMA  Additional Equipment: None  Intra-op Plan:   Post-operative Plan: Extubation in OR  Informed Consent: I have reviewed the patients History and Physical, chart, labs and discussed the procedure including the risks, benefits and alternatives for the proposed anesthesia with the patient or authorized representative who has indicated his/her understanding and acceptance.      Dental advisory given  Plan Discussed with: CRNA  Anesthesia Plan Comments:        Anesthesia Quick Evaluation

## 2019-06-27 ENCOUNTER — Ambulatory Visit (HOSPITAL_COMMUNITY)
Admission: RE | Admit: 2019-06-27 | Discharge: 2019-06-27 | Disposition: A | Discharge status: Home / Self Care | Payer: Medicaid Other | Attending: Urology | Admitting: Urology

## 2019-06-27 ENCOUNTER — Encounter (HOSPITAL_COMMUNITY)
Admission: RE | Disposition: A | Discharge status: Home / Self Care | Payer: Self-pay | Source: Home / Self Care | Attending: Urology

## 2019-06-27 ENCOUNTER — Encounter (HOSPITAL_COMMUNITY): Payer: Self-pay

## 2019-06-27 ENCOUNTER — Ambulatory Visit (HOSPITAL_COMMUNITY): Payer: Medicaid Other | Admitting: Anesthesiology

## 2019-06-27 ENCOUNTER — Ambulatory Visit (HOSPITAL_COMMUNITY): Payer: Medicaid Other

## 2019-06-27 ENCOUNTER — Ambulatory Visit (HOSPITAL_COMMUNITY): Payer: Medicaid Other | Admitting: Physician Assistant

## 2019-06-27 ENCOUNTER — Other Ambulatory Visit: Payer: Self-pay

## 2019-06-27 DIAGNOSIS — Z8249 Family history of ischemic heart disease and other diseases of the circulatory system: Secondary | ICD-10-CM | POA: Diagnosis not present

## 2019-06-27 DIAGNOSIS — E669 Obesity, unspecified: Secondary | ICD-10-CM | POA: Diagnosis not present

## 2019-06-27 DIAGNOSIS — Z888 Allergy status to other drugs, medicaments and biological substances status: Secondary | ICD-10-CM | POA: Diagnosis not present

## 2019-06-27 DIAGNOSIS — D509 Iron deficiency anemia, unspecified: Secondary | ICD-10-CM | POA: Diagnosis not present

## 2019-06-27 DIAGNOSIS — N201 Calculus of ureter: Secondary | ICD-10-CM | POA: Diagnosis not present

## 2019-06-27 DIAGNOSIS — F329 Major depressive disorder, single episode, unspecified: Secondary | ICD-10-CM | POA: Insufficient documentation

## 2019-06-27 DIAGNOSIS — Z880 Allergy status to penicillin: Secondary | ICD-10-CM | POA: Insufficient documentation

## 2019-06-27 DIAGNOSIS — I10 Essential (primary) hypertension: Secondary | ICD-10-CM | POA: Insufficient documentation

## 2019-06-27 DIAGNOSIS — F17210 Nicotine dependence, cigarettes, uncomplicated: Secondary | ICD-10-CM | POA: Insufficient documentation

## 2019-06-27 DIAGNOSIS — Z833 Family history of diabetes mellitus: Secondary | ICD-10-CM | POA: Insufficient documentation

## 2019-06-27 DIAGNOSIS — K219 Gastro-esophageal reflux disease without esophagitis: Secondary | ICD-10-CM | POA: Insufficient documentation

## 2019-06-27 DIAGNOSIS — Z818 Family history of other mental and behavioral disorders: Secondary | ICD-10-CM | POA: Insufficient documentation

## 2019-06-27 DIAGNOSIS — F419 Anxiety disorder, unspecified: Secondary | ICD-10-CM | POA: Insufficient documentation

## 2019-06-27 DIAGNOSIS — E1151 Type 2 diabetes mellitus with diabetic peripheral angiopathy without gangrene: Secondary | ICD-10-CM | POA: Diagnosis not present

## 2019-06-27 DIAGNOSIS — Z87442 Personal history of urinary calculi: Secondary | ICD-10-CM | POA: Diagnosis not present

## 2019-06-27 DIAGNOSIS — N289 Disorder of kidney and ureter, unspecified: Secondary | ICD-10-CM | POA: Diagnosis not present

## 2019-06-27 DIAGNOSIS — Z882 Allergy status to sulfonamides status: Secondary | ICD-10-CM | POA: Insufficient documentation

## 2019-06-27 DIAGNOSIS — Z881 Allergy status to other antibiotic agents status: Secondary | ICD-10-CM | POA: Insufficient documentation

## 2019-06-27 DIAGNOSIS — E213 Hyperparathyroidism, unspecified: Secondary | ICD-10-CM | POA: Insufficient documentation

## 2019-06-27 DIAGNOSIS — Z86718 Personal history of other venous thrombosis and embolism: Secondary | ICD-10-CM | POA: Insufficient documentation

## 2019-06-27 DIAGNOSIS — Z6841 Body Mass Index (BMI) 40.0 and over, adult: Secondary | ICD-10-CM | POA: Insufficient documentation

## 2019-06-27 HISTORY — PX: CYSTOSCOPY/URETEROSCOPY/HOLMIUM LASER/STENT PLACEMENT: SHX6546

## 2019-06-27 LAB — HEMOGLOBIN A1C
Hgb A1c MFr Bld: 8.3 % — ABNORMAL HIGH (ref 4.8–5.6)
Mean Plasma Glucose: 192 mg/dL

## 2019-06-27 LAB — GLUCOSE, CAPILLARY
Glucose-Capillary: 154 mg/dL — ABNORMAL HIGH (ref 70–99)
Glucose-Capillary: 143 mg/dL — ABNORMAL HIGH (ref 70–99)

## 2019-06-27 LAB — PREGNANCY, URINE: Preg Test, Ur: NEGATIVE

## 2019-06-27 SURGERY — CYSTOSCOPY/URETEROSCOPY/HOLMIUM LASER/STENT PLACEMENT
Anesthesia: General | Laterality: Right

## 2019-06-27 MED ORDER — LACTATED RINGERS IV SOLN
INTRAVENOUS | Status: DC
  Administered 2019-06-27: 08:00:00 via INTRAVENOUS

## 2019-06-27 MED ORDER — ACETAMINOPHEN 500 MG PO TABS
1000.0000 mg | ORAL_TABLET | Freq: Once | ORAL | Status: AC
  Administered 2019-06-27: 1000 mg via ORAL
  Filled 2019-06-27: qty 2

## 2019-06-27 MED ORDER — PROPOFOL 10 MG/ML IV BOLUS
INTRAVENOUS | Status: AC
  Filled 2019-06-27: qty 20

## 2019-06-27 MED ORDER — LIDOCAINE 2% (20 MG/ML) 5 ML SYRINGE
INTRAMUSCULAR | Status: AC
  Filled 2019-06-27: qty 5

## 2019-06-27 MED ORDER — FENTANYL CITRATE (PF) 100 MCG/2ML IJ SOLN: 25.0000 ug | INTRAMUSCULAR | Status: DC | PRN

## 2019-06-27 MED ORDER — SCOPOLAMINE 1 MG/3DAYS TD PT72
1.0000 | MEDICATED_PATCH | Freq: Once | TRANSDERMAL | Status: AC
  Administered 2019-06-27: 1 via TRANSDERMAL

## 2019-06-27 MED ORDER — FENTANYL CITRATE (PF) 100 MCG/2ML IJ SOLN
INTRAMUSCULAR | Status: DC | PRN
  Administered 2019-06-27: 25 ug via INTRAVENOUS
  Administered 2019-06-27: 50 ug via INTRAVENOUS

## 2019-06-27 MED ORDER — PHENYLEPHRINE 40 MCG/ML (10ML) SYRINGE FOR IV PUSH (FOR BLOOD PRESSURE SUPPORT)
PREFILLED_SYRINGE | INTRAVENOUS | Status: AC
  Filled 2019-06-27: qty 10

## 2019-06-27 MED ORDER — ACETAMINOPHEN 10 MG/ML IV SOLN: 1000.0000 mg | Freq: Once | INTRAVENOUS | Status: DC | PRN

## 2019-06-27 MED ORDER — MIDAZOLAM HCL 5 MG/5ML IJ SOLN
INTRAMUSCULAR | Status: DC | PRN
  Administered 2019-06-27: 2 mg via INTRAVENOUS

## 2019-06-27 MED ORDER — FENTANYL CITRATE (PF) 100 MCG/2ML IJ SOLN
INTRAMUSCULAR | Status: AC
  Filled 2019-06-27: qty 2

## 2019-06-27 MED ORDER — ONDANSETRON HCL 4 MG/2ML IJ SOLN
INTRAMUSCULAR | Status: AC
  Filled 2019-06-27: qty 2

## 2019-06-27 MED ORDER — SCOPOLAMINE 1 MG/3DAYS TD PT72
MEDICATED_PATCH | TRANSDERMAL | Status: AC
  Filled 2019-06-27: qty 1

## 2019-06-27 MED ORDER — DEXAMETHASONE SODIUM PHOSPHATE 10 MG/ML IJ SOLN
INTRAMUSCULAR | Status: DC | PRN
  Administered 2019-06-27: 10 mg via INTRAVENOUS

## 2019-06-27 MED ORDER — MIDAZOLAM HCL 2 MG/2ML IJ SOLN
INTRAMUSCULAR | Status: AC
  Filled 2019-06-27: qty 2

## 2019-06-27 MED ORDER — ONDANSETRON HCL 4 MG/2ML IJ SOLN
INTRAMUSCULAR | Status: DC | PRN
  Administered 2019-06-27: 4 mg via INTRAVENOUS

## 2019-06-27 MED ORDER — PROPOFOL 10 MG/ML IV BOLUS
INTRAVENOUS | Status: DC | PRN
  Administered 2019-06-27: 200 mg via INTRAVENOUS

## 2019-06-27 MED ORDER — PHENYLEPHRINE HCL (PRESSORS) 10 MG/ML IV SOLN
INTRAVENOUS | Status: DC | PRN
  Administered 2019-06-27: 60 ug via INTRAVENOUS

## 2019-06-27 MED ORDER — DEXAMETHASONE SODIUM PHOSPHATE 10 MG/ML IJ SOLN
INTRAMUSCULAR | Status: AC
  Filled 2019-06-27: qty 1

## 2019-06-27 MED ORDER — LIDOCAINE HCL (CARDIAC) PF 100 MG/5ML IV SOSY
PREFILLED_SYRINGE | INTRAVENOUS | Status: DC | PRN
  Administered 2019-06-27: 40 mg via INTRAVENOUS
  Administered 2019-06-27: 60 mg via INTRAVENOUS

## 2019-06-27 SURGICAL SUPPLY — 19 items
BAG URO CATCHER STRL LF (MISCELLANEOUS) ×2 IMPLANT
BASKET ZERO TIP NITINOL 2.4FR (BASKET) ×2 IMPLANT
CATH URET 5FR 28IN OPEN ENDED (CATHETERS) ×2 IMPLANT
CLOTH BEACON ORANGE TIMEOUT ST (SAFETY) ×2 IMPLANT
COVER WAND RF STERILE (DRAPES) IMPLANT
EXTRACTOR STONE NITINOL NGAGE (UROLOGICAL SUPPLIES) IMPLANT
FIBER LASER FLEXIVA 365 (UROLOGICAL SUPPLIES) IMPLANT
FIBER LASER TRAC TIP (UROLOGICAL SUPPLIES) ×2 IMPLANT
GLOVE BIOGEL M STRL SZ7.5 (GLOVE) ×2 IMPLANT
GOWN STRL REUS W/TWL XL LVL3 (GOWN DISPOSABLE) ×2 IMPLANT
GUIDEWIRE ZIPWRE .038 STRAIGHT (WIRE) ×2 IMPLANT
KIT TURNOVER KIT A (KITS) IMPLANT
MANIFOLD NEPTUNE II (INSTRUMENTS) ×2 IMPLANT
PACK CYSTO (CUSTOM PROCEDURE TRAY) ×2 IMPLANT
SHEATH URETERAL 12FRX35CM (MISCELLANEOUS) IMPLANT
STENT URET 6FRX24 CONTOUR (STENTS) ×2 IMPLANT
STENT URET 6FRX26 CONTOUR (STENTS) IMPLANT
TUBING CONNECTING 10 (TUBING) ×2 IMPLANT
TUBING UROLOGY SET (TUBING) ×2 IMPLANT

## 2019-06-27 NOTE — Anesthesia Procedure Notes (Signed)
Procedure Name: LMA Insertion Date/Time: 06/27/2019 9:44 AM Performed by: Glory Buff, CRNA Pre-anesthesia Checklist: Patient identified, Emergency Drugs available, Suction available and Patient being monitored Patient Re-evaluated:Patient Re-evaluated prior to induction Oxygen Delivery Method: Circle system utilized Preoxygenation: Pre-oxygenation with 100% oxygen Induction Type: IV induction LMA: LMA inserted LMA Size: 4.0 Number of attempts: 1 Placement Confirmation: positive ETCO2 Tube secured with: Tape Dental Injury: Teeth and Oropharynx as per pre-operative assessment

## 2019-06-27 NOTE — Anesthesia Postprocedure Evaluation (Signed)
Anesthesia Post Note  Patient: Terri Sparks  Procedure(s) Performed: CYSTOSCOPY/URETEROSCOPY/HOLMIUM LASER/STENT EXCHANGE (Right )     Patient location during evaluation: PACU Anesthesia Type: General Level of consciousness: awake and alert and oriented Pain management: pain level controlled Vital Signs Assessment: post-procedure vital signs reviewed and stable Respiratory status: spontaneous breathing, nonlabored ventilation and respiratory function stable Cardiovascular status: blood pressure returned to baseline Postop Assessment: no apparent nausea or vomiting Anesthetic complications: no    Last Vitals:  Vitals:   06/27/19 1045 06/27/19 1100  BP: (!) 137/95 115/76  Pulse: 86 91  Resp: 17 11  Temp:  36.9 C  SpO2: 100% 99%    Last Pain:  Vitals:   06/27/19 1100  TempSrc:   PainSc: 0-No pain                 Brennan Bailey

## 2019-06-27 NOTE — Transfer of Care (Signed)
Immediate Anesthesia Transfer of Care Note  Patient: Terri Sparks  Procedure(s) Performed: CYSTOSCOPY/URETEROSCOPY/HOLMIUM LASER/STENT EXCHANGE (Right )  Patient Location: PACU  Anesthesia Type:General  Level of Consciousness: drowsy, patient cooperative and responds to stimulation  Airway & Oxygen Therapy: Patient Spontanous Breathing and Patient connected to face mask oxygen  Post-op Assessment: Report given to RN and Post -op Vital signs reviewed and stable  Post vital signs: Reviewed and stable  Last Vitals:  Vitals Value Taken Time  BP 136/88 06/27/19 1032  Temp    Pulse 86 06/27/19 1034  Resp 20 06/27/19 1034  SpO2 97 % 06/27/19 1034  Vitals shown include unvalidated device data.  Last Pain:  Vitals:   06/27/19 0747  TempSrc:   PainSc: 10-Worst pain ever      Patients Stated Pain Goal: 3 ([id number])  Complications: No apparent anesthesia complications

## 2019-06-27 NOTE — H&P (Signed)
Urology Preoperative H&P   Chief Complaint: Right flank pain  History of Present Illness: Terri Sparks is a 43 y.o. female with a history of kidney stones.  She is status post bilateral ureteroscopy on 05/01/2019.  She subsequently developed another stone and was found to have a 5 mm right distal ureteral calculus on CT from 06/11/2019 after an acute episode of right-sided flank pain.  She required a right-sided stent at that time with Dr. [name] and is here today for definitive stone treatment.   Past Medical History:  Diagnosis Date  . Anxiety   . Depression   . Diabetes ([place])    Type 2  . GERD (gastroesophageal reflux disease)   . History of blood clots    ,DVT-left leg, and early 2000, had blood clot behind left breast  . History of kidney stones   . Hypercalcemia   . Hyperparathyroidism   . Hypertension    had been on Lisinopril and PCP took her off it 4 months ago  . Iron deficiency anemia   . Left thyroid nodule   . Mass of right ovary   . Nephrolithiasis   . 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 Peripheral vascular disease ([place])   . Prolonged Q-T interval on ECG 04/19/2019  . Renal calculi   . Tooth loose    top tooth from the right  is loose     Past Surgical History:  Procedure Laterality Date  . ABDOMINAL AORTOGRAM W/LOWER EXTREMITY N/A 11/14/2017   Procedure: ABDOMINAL AORTOGRAM W/LOWER EXTREMITY;  Surgeon: Waynetta Sandy, MD;  Location: [facility];  Service: Cardiovascular;  Laterality: N/A;  . ANGIOPLASTY ILLIAC ARTERY Left 02/25/2018   Procedure: BALLOON ANGIOPLASTY LEFT ILIAC ARTERY;  Surgeon: Conrad Stagecoach, MD;  Location: [place];  Service: Vascular;  Laterality: Left;  . APPLICATION OF WOUND VAC Left 03/03/2018   Procedure: APPLICATION OF WOUND VAC;  Surgeon: Angelia Mould, MD;  Location: [place];  Service: Vascular;  Laterality: Left;  . CYSTOSCOPY W/ URETERAL STENT PLACEMENT Right 06/27/2018   Procedure: CYSTOSCOPY WITH  RETROGRADE PYELOGRAM/URETERAL DOUBLE J STENT PLACEMENT;  Surgeon: Ceasar Mons, MD;  Location: [place];  Service: Urology;  Laterality: Right;  . CYSTOSCOPY WITH STENT PLACEMENT N/A 02/23/2019   Procedure: CYSTOSCOPY WITH RIGHT URETERAL STENT EXCHANGE/ LEFT URETERAL STENT PLACEMENT;  Surgeon: Ceasar Mons, MD;  Location: [facility];  Service: Urology;  Laterality: N/A;  . CYSTOSCOPY WITH STENT PLACEMENT Right 06/11/2019   Procedure: CYSTOSCOPY,RIGHT RETROGRADE WITH STENT PLACEMENT;  Surgeon: Irine Seal, MD;  Location: [facility];  Service: Urology;  Laterality: Right;  . CYSTOSCOPY/RETROGRADE/URETEROSCOPY/STONE EXTRACTION WITH BASKET Bilateral 05/01/2019   Procedure: CYSTOSCOPY/URETEROSCOPY/STONE EXTRACTION / LASER LITHOTRIPSY, BILATERAL URETEROSCOPY WITH URETERAL STONE EXTRACTION AND STENT EXCHANGE;  Surgeon: Ceasar Mons, MD;  Location: [facility];  Service: Urology;  Laterality: Bilateral;  . EMBOLECTOMY Left 02/24/2017   Procedure: Left Lower Extremity Embolectomy and Angiogram.;  Surgeon: Waynetta Sandy, MD;  Location: [place];  Service: Vascular;  Laterality: Left;  . INSERTION OF ILIAC STENT  02/24/2017   Procedure: INSERTION OF Common ILIAC STENT;  Surgeon: Waynetta Sandy, MD;  Location: [facility];  Service: Vascular;;  . INTRAOPERATIVE ARTERIOGRAM Left 02/25/2018   Procedure: INTRA OPERATIVE ARTERIOGRAM WITH LEFT LEG RUNOFF;  Surgeon: Conrad , MD;  Location: [place];  Service: Vascular;  Laterality: Left;  . LOWER EXTREMITY ANGIOGRAM Left 02/24/2017   Procedure: Aortagram, Left lower extremity  Run-off;  Surgeon: Waynetta Sandy, MD;  Location: [place];  Service: Vascular;  Laterality: Left;  . PATCH ANGIOPLASTY Left 02/25/2018   Procedure: PATCH ANGIOPLASTY LEFT SUPERFICIAL FEMORAL ARTERY WITH BOVINE PATCH;  Surgeon: Conrad Kilbourne, MD;  Location: [facility];  Service: Vascular;  Laterality: Left;  . PERCUTANEOUS VENOUS  THROMBECTOMY,LYSIS WITH INTRAVASCULAR ULTRASOUND (IVUS) Left 05/12/2018   Procedure: MECHANICAL THROMBECTOMY LEFT LEG, BALLOON ANGIOPLASTY LEFT ANTERIOR TIBIAL ARTERY, AORTOGRAM WITH LEFT LEG RUNOFF;  Surgeon: Serafina Mitchell, MD;  Location: [place];  Service: Vascular;  Laterality: Left;  . 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 THROMBECTOMY FEMORAL ARTERY Left 02/25/2018   Procedure: LEFT ILIAC AND POPLITEAL ARTERY THROMBECTOMY;  Surgeon: Conrad [place], MD;  Location: [place];  Service: Vascular;  Laterality: Left;  . TUBAL LIGATION    . WOUND DEBRIDEMENT Left 03/03/2018   Procedure: DEBRIDEMENT WOUND;  Surgeon: Angelia Mould, MD;  Location: [place];  Service: Vascular;  Laterality: Left;  . WOUND EXPLORATION Left 03/03/2018   Procedure: WOUND EXPLORATION;  Surgeon: Angelia Mould, MD;  Location: [place];  Service: Vascular;  Laterality: Left;    Allergies:  Allergies  Allergen Reactions  . Ciprofloxacin Hcl Hives  . Macrobid [Nitrofurantoin Macrocrystal] Hives, Shortness Of Breath and Rash  . Other Hives, Shortness Of Breath and Rash    NO "-CILLINS"!!!  . Penicillins Shortness Of Breath    Had had cephalosporins without incident Has patient had a PCN reaction causing immediate rash, facial/tongue/throat swelling, SOB or lightheadedness with hypotension: Yes Has patient had a PCN reaction causing severe rash involving mucus membranes or skin necrosis: Unk Has patient had a PCN reaction that required hospitalization: Unk Has patient had a PCN reaction occurring within the last 10 years: No If all of the above answers are "NO", then may proceed with Cephalosporin use.   . Sulfa Antibiotics Hives, Shortness Of Breath and Rash  . Dilaudid [Hydromorphone Hcl] Other (See Comments)    Asystole per pt report  . Lexapro [Escitalopram Oxalate] Other (See Comments)    "I just did not like it."    Family History  Problem Relation Age of Onset  . Depression Mother   . Diabetes Father    . Heart failure Father     Social History:  reports that she has been smoking cigarettes. She has a 3.75 pack-year smoking history. She has never used smokeless tobacco. She reports current alcohol use. She reports current drug use. Drug: Marijuana.  ROS: A complete review of systems was performed.  All systems are negative except for pertinent findings as noted.  Physical Exam:  Vital signs in last 24 hours: Temp:  [98.4 F (36.9 C)] 98.4 F (36.9 C) (09/22 0948) Pulse Rate:  [92] 92 (09/22 0948) Resp:  [20] 20 (09/22 0908) BP: (156)/(88) 156/88 (09/22 0948) SpO2:  [97 %] 97 % (09/22 0948) Weight:  [124.3 kg] 124.3 kg (09/22 0948) Constitutional:  Alert and oriented, No acute distress Cardiovascular: Regular rate and rhythm, No JVD Respiratory: Normal respiratory effort, Lungs clear bilaterally GI: Abdomen is soft, nontender, nondistended, no abdominal masses GU: No CVA tenderness Lymphatic: No lymphadenopathy Neurologic: Grossly intact, no focal deficits Psychiatric: Normal mood and affect  Laboratory Data:  No results for input(s): WBC, HGB, HCT, PLT in the last 72 hours.  Recent Labs    06/26/19 1031  NA 136  K 5.0  CL 106  GLUCOSE 183*  BUN 20  CALCIUM 9.0  CREATININE 1.98*  Results for orders placed or performed during the hospital encounter of 06/26/19 (from the past 24 hour(s))  Glucose, capillary     Status: Abnormal   Collection Time: 06/26/19  9:28 AM  Result Value Ref Range   Glucose-Capillary 184 (H) 70 - 99 mg/dL  Basic metabolic panel     Status: Abnormal   Collection Time: 06/26/19 10:31 AM  Result Value Ref Range   Sodium 136 135 - 145 mmol/L   Potassium 5.0 3.5 - 5.1 mmol/L   Chloride 106 98 - 111 mmol/L   CO2 22 22 - 32 mmol/L   Glucose, Bld 183 (H) 70 - 99 mg/dL   BUN 20 6 - 20 mg/dL   Creatinine, Ser 1.98 (H) 0.44 - 1.00 mg/dL   Calcium 9.0 8.9 - 10.3 mg/dL   GFR calc non Af Amer 30 (L) >60 mL/min   GFR calc Af Amer 35 (L) >60  mL/min   Anion gap 8 5 - 15  Protime-INR     Status: None   Collection Time: 06/26/19 10:31 AM  Result Value Ref Range   Prothrombin Time 14.6 11.4 - 15.2 seconds   INR 1.2 0.8 - 1.2  Surgical pcr screen     Status: None   Collection Time: 06/26/19 10:31 AM   Specimen: Nasal Mucosa; Nasal Swab  Result Value Ref Range   MRSA, PCR NEGATIVE NEGATIVE   Staphylococcus aureus NEGATIVE NEGATIVE  Hemoglobin A1c     Status: Abnormal   Collection Time: 06/26/19 10:31 AM  Result Value Ref Range   Hgb A1c MFr Bld 8.3 (H) 4.8 - 5.6 %   Mean Plasma Glucose 192 mg/dL   Recent Results (from the past 240 hour(s))  Novel Coronavirus, NAA (Hosp order, Send-out to Ref Lab; TAT 18-24 hrs     Status: None   Collection Time: 06/23/19 12:54 PM   Specimen: Nasopharyngeal Swab; Respiratory  Result Value Ref Range Status   SARS-CoV-2, NAA NOT DETECTED NOT DETECTED Final    Comment: (NOTE) This nucleic acid amplification test was developed and its performance characteristics determined by [employer]. Nucleic acid amplification tests include PCR and TMA. This test has not been FDA cleared or approved. This test has been authorized by FDA under an Emergency Use Authorization (EUA). This test is only authorized for the duration of time the declaration that circumstances exist justifying the authorization of the emergency use of in vitro diagnostic tests for detection of SARS-CoV-2 virus and/or diagnosis of COVID-19 infection under section 564(b)(1) of the Act, 21 U.S.C. [id number]) (1), unless the authorization is terminated or revoked sooner. When diagnostic testing is negative, the possibility of a false negative result should be considered in the context of a patient's recent exposures and the presence of clinical signs and symptoms consistent with COVID-19. An individual without symptoms of COVID- 19 and who is not shedding SARS-CoV-2 vi rus would expect to have a negative (not detected)  result in this assay. Performed At: [facility] [address] [place], [place] [id number] Rush Farmer MD [id number]    [place]  Final    Comment: Performed at [facility], [place] [address] [place], [place] [address]  Surgical pcr screen     Status: None   Collection Time: 06/26/19 10:31 AM   Specimen: Nasal Mucosa; Nasal Swab  Result Value Ref Range Status   MRSA, PCR NEGATIVE NEGATIVE Final   Staphylococcus aureus NEGATIVE NEGATIVE Final    Comment: (NOTE) The Xpert SA  Assay (FDA approved for NASAL specimens in patients 41 years of age and older), is one component of a comprehensive surveillance program. It is not intended to diagnose infection nor to guide or monitor treatment. Performed at [facility], [place] [address] [place], [place] [address]     Renal Function: Recent Labs    06/26/19 1031  CREATININE 1.98*   Estimated Creatinine Clearance: 49.6 mL/min (A) (by C-G formula based on SCr of 1.98 mg/dL (H)).  Radiologic Imaging: No results found.  I independently reviewed the above imaging studies.  Assessment and Plan Jennica K Chura is a 43 y.o. female with 5 mm right distal ureteral calculus  The risks, benefits and alternatives of cystoscopy with RIGHT ureteroscopy, laser lithotripsy and ureteral stent placement was discussed the patient.  Risks included, but are not limited to: bleeding, urinary tract infection, ureteral injury/avulsion, ureteral stricture formation, retained stone fragments, the possibility that multiple surgeries may be required to treat the stone(s), MI, stroke, PE and the inherent risks of general anesthesia.  The patient voices understanding and wishes to proceed.      Ellison Hughs, MD 06/27/2019, 7:29 AM  [facility] Pager: [phone]

## 2019-06-27 NOTE — Op Note (Addendum)
Operative Note  Preoperative diagnosis:  1.  5 mm right mid ureteral calculus  Postoperative diagnosis: 1.  5 mm right mid ureteral calculus  Procedure(s): 1.  Cystoscopy with right ureteroscopy, holmium laser lithotripsy and right JJ stent placement 2.  Right JJ stent exchange  Surgeon: Ellison Hughs, MD  Assistants:  None  Anesthesia:  General  Complications:  None  EBL: LESS than 5 mL  Specimens: 1.  Previously placed right JJ stent was inspected and discarded 2.  5 mm right mid ureteral stone  Drains/Catheters: 1.  6 French, 24 cm JJ stent with tether  Intraoperative findings:   1. Nonobstructing right mid ureteral stone 2. Her previously placed right JJ stent was removed intact, inspected and discarded  Indication:  [name] is a 43 y.o. female with a history of recurrent urolithiasis.  She underwent right JJ stent placement on 06/11/2019 due to urosepsis from an obstructing 5 mm right mid ureteral stone.  She is here today for definitive stone treatment.  She has been consented for the above procedures, voices understanding and wishes to proceed.  Description of procedure:  After informed consent was obtained, the patient was brought to the operating room and general LMA anesthesia was administered. The patient was then placed in the dorsolithotomy position and prepped and draped in the usual sterile fashion. A timeout was performed. A 23 French rigid cystoscope was then inserted into the urethral meatus and advanced into the bladder under direct vision. A complete bladder survey revealed no intravesical pathology.  Her previously placed right JJ stent was then grasped at its distal curl and retracted to the urethral meatus.  A Glidewire was then advanced through the lumen of the stent up to the right renal pelvis, under fluoroscopic guidance.  The stent was then removed over the wire intact, inspected and discarded.  A semirigid ureteroscope was then advanced  alongside the right ureteral wire and her stone was identified within the midportion of the right ureter.  A 200 m holmium laser was then used to fracture the stone into 2 smaller pieces.  A 0 tip basket was then used to extract all stone fragments from the lumen of the right ureter.  A 6 French, 24 cm JJ stent was then placed over the wire and into good position within the right collecting system, confirming placement via fluoroscopy.  The tether of the stent was left intact.  The patient's bladder was drained and all stone fragments were removed.  The tether the stent was tucked into the vaginal vault.  She tolerated the procedure well and was transferred to the postanesthesia in stable condition.  Plan: The patient has been instructed to remove her stent at 8 AM on 06/29/2019.  I will have her follow-up in the office in 6 weeks with a right renal ultrasound and plan to do a metabolic stone evaluation at that time.

## 2019-06-28 ENCOUNTER — Encounter (HOSPITAL_COMMUNITY): Payer: Self-pay | Admitting: Urology

## 2019-07-04 ENCOUNTER — Encounter: Payer: Medicaid Other | Admitting: Pharmacist

## 2019-07-06 ENCOUNTER — Encounter: Payer: Medicaid Other | Admitting: Pharmacist

## 2019-07-06 MED FILL — ?HUMALOG 100 UNITS/ML KWIKP: 100 | 33 days supply | Qty: 15 | Fill #2

## 2019-07-06 MED FILL — ?BASAGLAR 100 UNITS/ML KWPE: 100 | 30 days supply | Qty: 21 | Fill #2

## 2019-07-12 ENCOUNTER — Other Ambulatory Visit: Payer: Self-pay | Admitting: Internal Medicine

## 2019-07-12 ENCOUNTER — Telehealth: Payer: Self-pay | Admitting: Internal Medicine

## 2019-07-12 DIAGNOSIS — G5792 Unspecified mononeuropathy of left lower limb: Secondary | ICD-10-CM

## 2019-07-12 MED FILL — SERTRALINE HCL 50 MG TABS: 50 | 30 days supply | Qty: 30 | Fill #2

## 2019-07-12 MED FILL — ?AMLODIPINE BESYLATE 5MG TA: 5 | 30 days supply | Qty: 30 | Fill #2

## 2019-07-12 NOTE — Telephone Encounter (Signed)
New Message  Pt came in wanting to be seen but then stated she has to leave and will try and come back later today. Pt is also requesting a call from you. Please f/u

## 2019-07-12 NOTE — Telephone Encounter (Signed)
Pt scheduled for INR check.

## 2019-07-13 ENCOUNTER — Other Ambulatory Visit: Payer: Self-pay

## 2019-07-13 ENCOUNTER — Ambulatory Visit: Payer: Self-pay | Attending: Internal Medicine | Admitting: Pharmacist

## 2019-07-13 DIAGNOSIS — I745 Embolism and thrombosis of iliac artery: Secondary | ICD-10-CM

## 2019-07-13 DIAGNOSIS — I749 Embolism and thrombosis of unspecified artery: Secondary | ICD-10-CM

## 2019-07-13 LAB — POCT INR: INR: 2.4 (ref 2.0–3.0)

## 2019-07-13 MED FILL — BUPROPION SR 150 MG TABLET: 150 | 30 days supply | Qty: 60 | Fill #2

## 2019-07-20 IMAGING — CT CT ANGIO AOBIFEM WO/W CM
1 of 6 series · 5 of 16 positions shown, 7 images · IV contrast (iopamidol)
Comparison: 02/25/2018, 03/01/2017, 02/24/2017

CLINICAL DATA: 42-year-old female with a history of left leg pain
and shortness of breath

EXAM:
CT ANGIOGRAPHY CHEST, ABDOMEN AND PELVIS
TECHNIQUE: Multidetector CT imaging through the chest, abdomen and pelvis was
performed using the standard protocol during bolus administration of
intravenous contrast. Multiplanar reconstructed images and MIPs were
obtained and reviewed to evaluate the vascular anatomy.
CONTRAST:  100mL 0ZWXRE-SJV IOPAMIDOL (0ZWXRE-SJV) INJECTION 76%

[Series 6: cta runoff (id) · axial · 0.98mm/px · z∈[+192,+1104]mm · 5 of 456 slices shown, 7 images]
[im 76/456  soft-tissue]
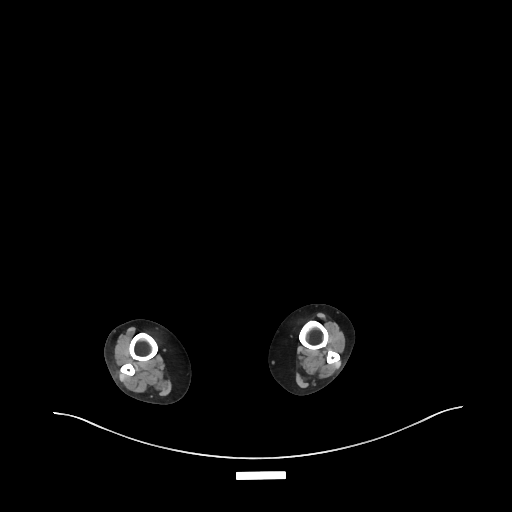
[im 76/456  bone]
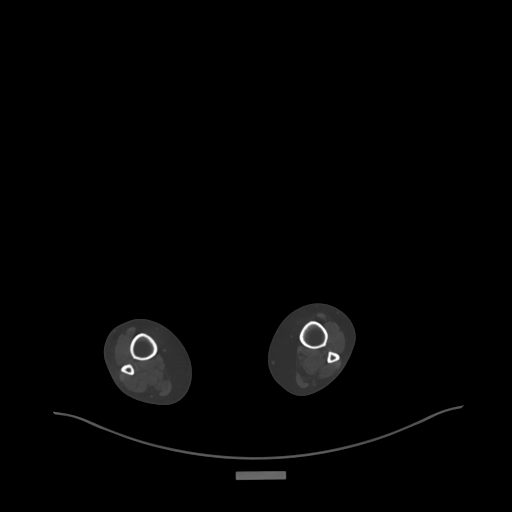
[im 152/456  soft-tissue]
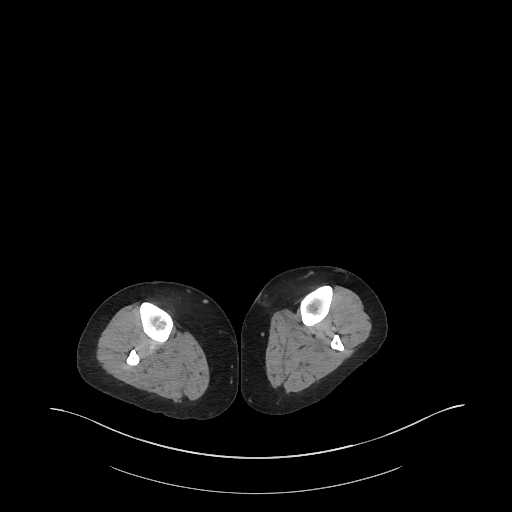
[im 228/456  soft-tissue]
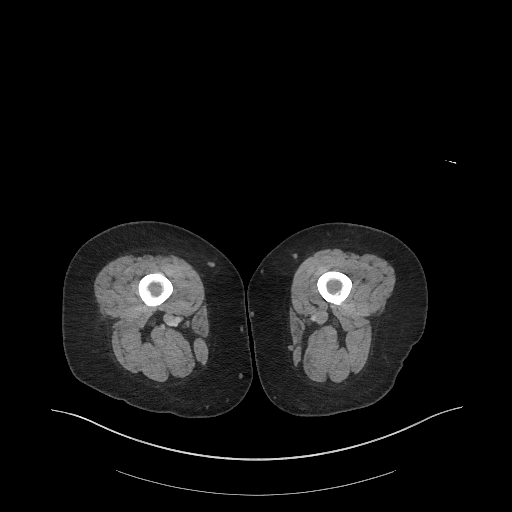
[im 304/456  soft-tissue]
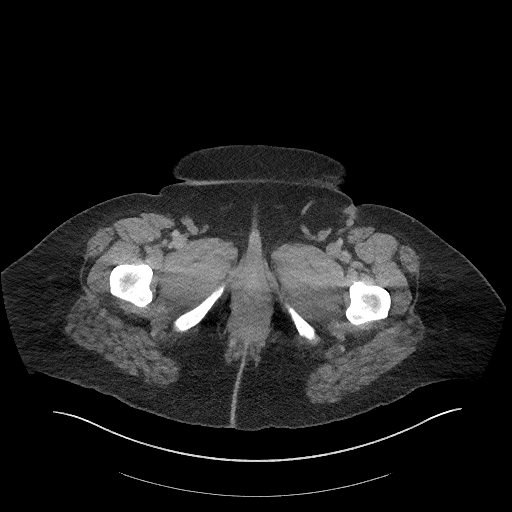
[im 380/456  soft-tissue]
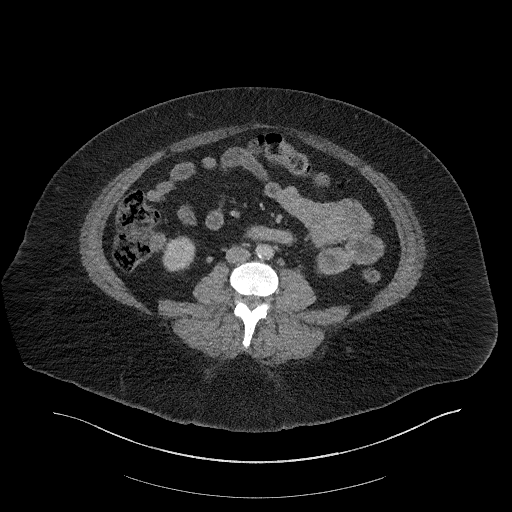
[im 380/456  bone]
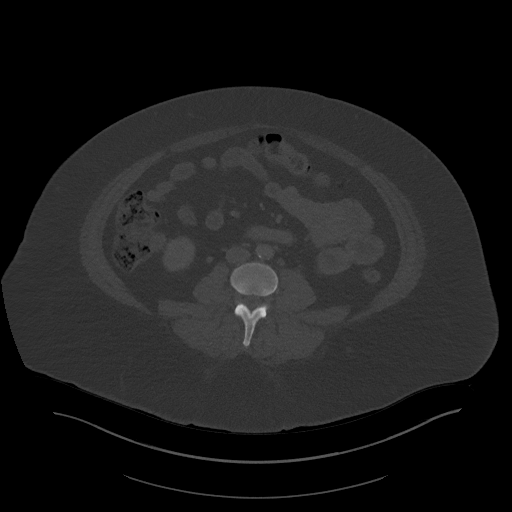

[5 of 16 positions shown; findings below may reference images not displayed]

FINDINGS: CTA CHEST FINDINGS

Cardiovascular:

Heart:

No cardiomegaly. No pericardial fluid/thickening. No significant
coronary calcifications.

Aorta:

Unremarkable course, caliber, contour of the thoracic aorta. No
aneurysm or dissection flap. No periaortic fluid.

Pulmonary arteries:

No central, lobar, segmental, or proximal subsegmental filling
defects. Distally, the contrast bolus is ill timed for a sensitive
evaluation.

Mediastinum/Nodes: No mediastinal adenopathy. Unremarkable
appearance of the thoracic esophagus.

Unremarkable appearance of the thoracic inlet and thyroid.

Lungs/Pleura: Central airways are clear. No pleural effusion. No
confluent airspace disease.

No pneumothorax. Adjacent nodules of the right lower lobe, 8 mm and
4 mm, next to the fissure. These were present on the comparison CT
of 03/01/2017, and discussed as have been stable since 8516 most
likely subpleural lymph nodes.

Musculoskeletal: No acute displaced fracture. Degenerative changes
of the spine.

Review of the MIP images confirms the above findings.

CTA ABDOMEN AND PELVIS FINDINGS

VASCULAR

Note that the bolus timing of this current CT angiogram is ill
timed, which limits the sensitivity of the examination.

Aorta: Unremarkable course, caliber, contour of the abdominal aorta.
No dissection, aneurysm, or periaortic fluid. No significant
atherosclerotic changes.

Celiac: Celiac artery is patent.  Branches are patent.

SMA: SMA is patent.

Renals: Right renal artery is patent without significant calcified
atherosclerosis at the origin. The left renal artery is poorly
characterized, though was stenotic on the prior CT with changes of
the left renal cortex.

IMA: IMA is patent.

Right lower extremity:

No significant calcified atherosclerosis of the right iliac system.
Common iliac artery, hypogastric artery, external iliac artery
appears patent.

Common femoral artery appears patent.

Superficial femoral artery appears patent to the popliteal artery.

Assessment of the below knee arteries is limited by the contrast
bolus.

Left lower extremity:

Mild calcifications of the left common iliac artery, with
redemonstration of stent of the common iliac artery. Redemonstration
of stent occlusion, with the occlusion extending across the origin
of the left hypogastric artery. There does appear to be
reconstitution of the distal external iliac artery.

Assessment of the common femoral artery is limited secondary to
timing of the contrast bolus.

The left superficial femoral artery and the profunda femoris remain
patent at the origin. The left SFA is patent to the popliteal
artery. Surgical changes in the left popliteal region. The distal
popliteal artery and below knee arteries on the left are not
assessed on the current CT.

Veins: Unremarkable appearance of the venous system, which is
incompletely opacified.

Review of the MIP images confirms the above findings.

NON-VASCULAR

Hepatobiliary: Diffusely decreased attenuation/enhancement of liver
parenchyma. Right liver lobe measures at least 17 cm in craniocaudal
span. Unremarkable gallbladder

Pancreas: Unremarkable pancreas

Spleen: Unremarkable spleen

Adrenals/Urinary Tract: Unremarkable right adrenal gland. Left
adrenal nodule again demonstrated measuring 15 mm. This lesion is
incompletely characterized on the current CT and remains unchanged
in size over time.

Right:

No hydronephrosis. Symmetric perfusion to the left. No
nephrolithiasis. Irregularity of the lateral cortex of the right
kidney with mild cortical thinning. Unremarkable course of the right
ureter.

Left:

No left hydronephrosis. No definite nephrolithiasis. Regions of
focal cortical thinning of the left kidney, similar to prior.
Unremarkable course of the left ureter.

Unremarkable appearance of the urinary bladder .

Stomach/Bowel: Unremarkable appearance of the stomach. Unremarkable
appearance of small bowel. No evidence of obstruction. Colonic
diverticula. No associated inflammatory changes. Normal appendix.

Lymphatic: No lymphadenopathy

Mesenteric: No free fluid or air. No adenopathy.

Reproductive: Unremarkable appearance of the uterus. Right adnexal
lesion with Hounsfield units measuring 26. Greatest diameter 3.5 cm.

Other: No hernia.

Musculoskeletal: No evidence of acute fracture. No bony canal
narrowing. No significant degenerative changes of the hips.

.
IMPRESSION: No acute finding identified, however, the timing of the contrast
bolus is such that this study is essentially nondiagnostic for
evaluation of the arteries below the knees. Correlation with
noninvasive testing would be recommended.

**An incidental finding of potential clinical significance has been
found. Enlarging right adnexal/ovarian soft tissue lesion. This is
incompletely characterized on the current CT though appears
intermediate density, and not a simple cyst. Referral for Ob/gyn
evaluation is recommended to initiate surveillance.**

Negative for pulmonary embolism.

Redemonstration of occluded left common iliac artery stent, with
reconstitution of left common femoral artery. The left SFA appears
patent to the knee.

The right iliac artery, common femoral artery, and right superficial
femoral artery appear patent to the knee.

Redemonstration of atherosclerotic changes of the proximal left
renal artery, which are better characterized on prior CT. Associated
evidence of multiple prior left renal infarction.

Redemonstration of left adrenal nodule, likely adenoma though
incompletely characterized on the current CT.

Bilateral renal focal cortical defects, likely from prior
infection/infarction. This is worst on the left.

Diverticular disease without evidence of acute diverticulitis.

Fatty liver.

## 2019-07-27 ENCOUNTER — Other Ambulatory Visit: Payer: Self-pay | Admitting: Vascular Surgery

## 2019-07-27 DIAGNOSIS — I779 Disorder of arteries and arterioles, unspecified: Secondary | ICD-10-CM

## 2019-07-27 DIAGNOSIS — I739 Peripheral vascular disease, unspecified: Secondary | ICD-10-CM

## 2019-07-30 ENCOUNTER — Ambulatory Visit (INDEPENDENT_AMBULATORY_CARE_PROVIDER_SITE_OTHER): Payer: Self-pay | Admitting: Family Medicine

## 2019-07-30 ENCOUNTER — Encounter: Payer: Self-pay | Admitting: Internal Medicine

## 2019-07-30 ENCOUNTER — Encounter: Payer: Self-pay | Admitting: Family Medicine

## 2019-07-30 ENCOUNTER — Ambulatory Visit: Payer: Self-pay | Attending: Internal Medicine | Admitting: Internal Medicine

## 2019-07-30 ENCOUNTER — Other Ambulatory Visit: Payer: Self-pay

## 2019-07-30 VITALS — BP 127/84 | HR 123 | Temp 99.5°F | Resp 16 | Wt 260.0 lb

## 2019-07-30 VITALS — BP 148/70 | HR 123 | Wt 259.0 lb

## 2019-07-30 DIAGNOSIS — E1121 Type 2 diabetes mellitus with diabetic nephropathy: Secondary | ICD-10-CM

## 2019-07-30 DIAGNOSIS — I10 Essential (primary) hypertension: Secondary | ICD-10-CM

## 2019-07-30 DIAGNOSIS — F17200 Nicotine dependence, unspecified, uncomplicated: Secondary | ICD-10-CM

## 2019-07-30 DIAGNOSIS — E538 Deficiency of other specified B group vitamins: Secondary | ICD-10-CM

## 2019-07-30 DIAGNOSIS — R000 Tachycardia, unspecified: Secondary | ICD-10-CM

## 2019-07-30 DIAGNOSIS — IMO0002 Reserved for concepts with insufficient information to code with codable children: Secondary | ICD-10-CM

## 2019-07-30 DIAGNOSIS — N939 Abnormal uterine and vaginal bleeding, unspecified: Secondary | ICD-10-CM

## 2019-07-30 DIAGNOSIS — I745 Embolism and thrombosis of iliac artery: Secondary | ICD-10-CM

## 2019-07-30 DIAGNOSIS — K219 Gastro-esophageal reflux disease without esophagitis: Secondary | ICD-10-CM

## 2019-07-30 DIAGNOSIS — E1165 Type 2 diabetes mellitus with hyperglycemia: Secondary | ICD-10-CM

## 2019-07-30 DIAGNOSIS — F419 Anxiety disorder, unspecified: Secondary | ICD-10-CM

## 2019-07-30 DIAGNOSIS — N1832 Chronic kidney disease, stage 3b: Secondary | ICD-10-CM | POA: Insufficient documentation

## 2019-07-30 LAB — POCT INR: INR: 1.8 — AB (ref 2.0–3.0)

## 2019-07-30 LAB — GLUCOSE, POCT (MANUAL RESULT ENTRY): POC Glucose: 317 mg/dl — AB (ref 70–99)

## 2019-07-30 MED ORDER — BUPROPION HCL ER (SR) 150 MG PO TB12: 150.0000 mg | ORAL_TABLET | Freq: Two times a day (BID) | ORAL | 3 refills | Status: DC

## 2019-07-30 MED ORDER — VITAMIN B-12 1000 MCG PO TABS: 2000.0000 ug | ORAL_TABLET | Freq: Every day | ORAL | 3 refills | Status: DC

## 2019-07-30 MED ORDER — AMLODIPINE BESYLATE 5 MG PO TABS: 5.0000 mg | ORAL_TABLET | Freq: Every day | ORAL | 3 refills | Status: DC

## 2019-07-30 MED ORDER — GABAPENTIN 400 MG PO CAPS: 400.0000 mg | ORAL_CAPSULE | Freq: Three times a day (TID) | ORAL | 4 refills | Status: DC

## 2019-07-30 MED ORDER — SERTRALINE HCL 50 MG PO TABS: 50.0000 mg | ORAL_TABLET | Freq: Every day | ORAL | 3 refills | Status: DC

## 2019-07-30 MED ORDER — OMEPRAZOLE 20 MG PO CPDR: 20.0000 mg | DELAYED_RELEASE_CAPSULE | Freq: Two times a day (BID) | ORAL | 4 refills | Status: DC

## 2019-07-30 MED ORDER — HYDROXYZINE HCL 50 MG PO TABS: 50.0000 mg | ORAL_TABLET | Freq: Three times a day (TID) | ORAL | 3 refills | Status: DC | PRN

## 2019-07-30 MED ORDER — TRUE METRIX BLOOD GLUCOSE TEST VI STRP: ORAL_STRIP | 12 refills | Status: DC

## 2019-07-30 MED ORDER — FOLIC ACID 1 MG PO TABS: 1.0000 mg | ORAL_TABLET | Freq: Every day | ORAL | 2 refills | Status: DC

## 2019-07-30 MED FILL — SERTRALINE HCL 50 MG TABS: 50 | 33 days supply | Qty: 30 | Fill #0

## 2019-07-30 MED FILL — BUPROPION SR 150 MG TABLET: 150 | 30 days supply | Qty: 60 | Fill #0

## 2019-07-30 MED FILL — ?FOLIC ACID 1MG TAB: 1 | 30 days supply | Qty: 30 | Fill #0

## 2019-07-30 MED FILL — ?AMLODIPINE BESYLATE 5MG TA: 5 | 30 days supply | Qty: 30 | Fill #0

## 2019-07-30 MED FILL — hydrOXYzine HCL 50 MG TABS: 50 | 30 days supply | Qty: 90 | Fill #0

## 2019-07-30 MED FILL — GABAPENTIN 400 MG CAPSULE: 400 | 30 days supply | Qty: 90 | Fill #0

## 2019-07-30 MED FILL — ?OMEPRAZOLE 20MG CAP DR: 20 | 30 days supply | Qty: 60 | Fill #0

## 2019-07-30 NOTE — Progress Notes (Signed)
Patient ID: Terri Sparks, female    DOB: 04/29/1976  MRN: [id number]  CC: Medication Refill   Subjective: Terri Sparks is a 43 y.o. female who presents for med RFs/chronic ds management Her concerns today include:  43 year old female with history ofDM type2 with macroalbumin, CKD 3, HTN, anxiey, tob dep,PVDwith hx LLEacute limb ischemia requiring stent to the iliac artery and embolectomy followed by Dr. Zenia Resides vascular surgery, hyperparathyroidism s/p resection of parathryroid adenoma 2010, GERD,IDA,and anxiety/dep  Needs RF on Wellbutrin, Visteril, Zoloft, omeprazole, amlodipine gabapentin.  Out of medicines for about 1 week.  She brings form from her disability lawyer requesting it to be completed.  DM: Out of strips so she has not been checking her blood sugars.  Requests refill on strips today.  Reports compliance with taking Lantus 73 units and NovoLog sliding scale.  She tells me she takes between 5 to 15 units of the NovoLog with meals.  She feels she does okay with her eating habits but sometimes she stays sick on her stomach a lot.  She reports increased reflux symptoms since being out of omeprazole. -She has macroalbumin but is not on lisinopril due to elevated creatinine and poor kidney function.  She is on amlodipine.  HTN/CKD 3: Out of omeprazole.  She tries to limit salt in the foods.  No device to check blood pressure.  No chest pains. -Noted to have tachycardia today.  She denies any palpitations.  No shortness of breath.  She does report some pain over the left shoulder blade at times when she takes a deep breath.  Reports compliance with Coumadin except she missed a dose last night.  INR today was 1.8.  Tobacco dependence: She is down to half a pack a day.  She is trying to quit.  She has the nicotine patches but states she is not having to use them.  She has had ongoing urologic problems and right hydronephrosis and stones.  Had a stent placed in the right  ureter on 06/27/2019.  However she states that it fell out within 7 hours.  She has follow-up with urology November 3.  Denies any dysuria or hematuria at this time.  During her recent hospitalization she was found to have B12 and folate deficiency.  She has been taking supplements but has been out of them for 1 to 2 weeks.  Her B12 level was 129 and folate level was 5.9.  Reports compliance with iron supplement.    Lab Results  Component Value Date   HGBA1C 8.3 (H) 06/26/2019  -compliant with insulin  Patient Active Problem List   Diagnosis Date Noted  . Sepsis ([place]) 06/12/2019  . Acute pyelonephritis 06/12/2019  . Iron deficiency anemia due to chronic blood loss 06/12/2019  . Vitamin B12 deficiency 06/12/2019  . Folate deficiency 06/12/2019  . Constipation 06/12/2019  . AKI (acute kidney injury) ([place])   . Prolonged QT interval   . Urinary tract infection without hematuria   . Ureteral stone with hydronephrosis 06/11/2019  . Acute renal failure superimposed on stage 3 chronic kidney disease ([place]) 06/26/2018  . Right ureteral stone 06/26/2018  . Essential hypertension 06/26/2018  . Supratherapeutic INR 06/26/2018  . Adrenal nodule ([place]) 06/21/2018  . Lung nodules 06/21/2018  . Abnormal uterine bleeding (AUB) 05/14/2018  . Hemorrhagic cyst of right ovary 05/14/2018  . Acute blood loss anemia 05/14/2018  . Ischemia of extremity 05/12/2018  . Former smoker 03/14/2018  . Thrombosis of left iliac artery (HCC)  02/25/2018  . Thromboembolism ([place]) 02/25/2018  . Microalbuminuria 09/29/2017  . Insomnia 09/29/2017  . Immunization due 08/01/2017  . Anxiety and depression 04/28/2017  . 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 Iliac artery occlusion, left ([place]) 02/25/2017  . Tobacco abuse 02/25/2017  . Diabetes mellitus, type II ([place]) 02/25/2017  . Peripheral vascular disease of lower extremity ([place]) 02/24/2017  . GERD (gastroesophageal reflux disease)   . Hyperparathyroidism       Current Outpatient Medications on File Prior to Visit  Medication Sig Dispense Refill  . albuterol (PROAIR HFA) 108 (90 Base) MCG/ACT inhaler Inhale 2 puffs into the lungs every 6 (six) hours as needed for wheezing or shortness of breath. 18 g 0  . amLODipine (NORVASC) 5 MG tablet Take 1 tablet (5 mg total) by mouth daily. 30 tablet 3  . aspirin EC 81 MG tablet Take 81 mg by mouth daily.    . blood glucose meter kit and supplies Dispense based on patient and insurance preference. Use up to four times daily as directed. (FOR ICD-9 250.00, 250.01). 1 each 0  . Blood Glucose Monitoring Suppl (TRUE METRIX METER) w/Device KIT Use as directed 1 kit 0  . buPROPion (WELLBUTRIN SR) 150 MG 12 hr tablet Take 1 tablet (150 mg total) by mouth 2 (two) times daily. 60 tablet 0  . ferrous sulfate 325 (65 FE) MG tablet Take 1 tablet (325 mg total) by mouth daily with breakfast. 341 tablet 1  . folic acid (FOLVITE) 1 MG tablet Take 1 tablet (1 mg total) by mouth daily. 30 tablet 0  . glucose blood test strip Use as instructed 100 each 12  . hydrOXYzine (ATARAX/VISTARIL) 50 MG tablet Take 1 tablet (50 mg total) by mouth 3 (three) times daily as needed for itching or anxiety. 30 tablet 0  . insulin aspart (NOVOLOG) 100 UNIT/ML injection Inject 5-15 Units into the skin 3 (three) times daily before meals.    . insulin glargine (LANTUS) 100 UNIT/ML injection Inject 73 Units into the skin at bedtime.    . Insulin Pen Needle 31G X 5 MM MISC Use with Lantus and Novolog injections. 100 each 11  . Insulin Syringe-Needle U-100 (BD INSULIN SYRINGE ULTRAFINE) 31G X 5/16" 0.5 ML MISC Use as directed 100 each 3  . lip balm (CARMEX) ointment Apply topically as needed for lip care. 7 g 0  . loratadine (CLARITIN) 10 MG tablet Take 1 tablet (10 mg total) by mouth daily as needed for allergies. 30 tablet 0  . menthol-cetylpyridinium (CEPACOL) 3 MG lozenge Take 1 lozenge (3 mg total) by mouth as needed for sore throat. 100 tablet  12  . nicotine (NICODERM CQ - DOSED IN MG/24 HOURS) 14 mg/24hr patch Place 1 patch (14 mg total) onto the skin daily. 28 patch 0  . omeprazole (PRILOSEC) 20 MG capsule Take 2 capsules (40 mg total) by mouth daily. (Patient taking differently: Take 20 mg by mouth 2 (two) times daily before a meal. ) 60 capsule 0  . ondansetron (ZOFRAN) 4 MG tablet Take 1 tablet (4 mg total) by mouth daily as needed for nausea or vomiting. (Patient not taking: Reported on 07/30/2019) 30 tablet 1  . polyethylene glycol (MIRALAX / GLYCOLAX) 17 g packet Take 17 g by mouth 2 (two) times daily. 14 each 0  . senna-docusate (SENOKOT-S) 8.6-50 MG tablet Take 1 tablet by mouth 2 (two) times daily. 60 tablet 0  . sertraline (ZOLOFT) 50 MG tablet Take 1 tablet (50  mg total) by mouth daily. 1/2 tab daily x 2 wks then 1 tab PO daily (Patient taking differently: Take 50 mg by mouth daily. ) 30 tablet 3  . sodium chloride (OCEAN) 0.65 % SOLN nasal spray Place 1 spray into both nostrils as needed for congestion (nose irritation). 30 mL 0  . TRUEPLUS LANCETS 28G MISC Use as directed 100 each 6  . vitamin B-12 (CYANOCOBALAMIN) 1000 MCG tablet Take 2 tablets (2,000 mcg total) by mouth daily. 60 tablet 0  . warfarin (COUMADIN) 5 MG tablet TAKE 2 TABLETS ON MONDAY, WEDNESDAY AND FRIDAY WITH 1 TABLET ALL OTHER DAYS. (Patient taking differently: Take 5-10 mg by mouth See admin instructions. Take 10 Monday, Wednesday and Friday and 5 mg on Tuesday, Thursday, Saturday and Sunday) 60 tablet 3   No current facility-administered medications on file prior to visit.     Allergies  Allergen Reactions  . Ciprofloxacin Hcl Hives  . Macrobid [Nitrofurantoin Macrocrystal] Hives, Shortness Of Breath and Rash  . Other Hives, Shortness Of Breath and Rash    NO "-CILLINS"!!!  . Penicillins Shortness Of Breath    Had had cephalosporins without incident Has patient had a PCN reaction causing immediate rash, facial/tongue/throat swelling, SOB or  lightheadedness with hypotension: Yes Has patient had a PCN reaction causing severe rash involving mucus membranes or skin necrosis: Unk Has patient had a PCN reaction that required hospitalization: Unk Has patient had a PCN reaction occurring within the last 10 years: No If all of the above answers are "NO", then may proceed with Cephalosporin use.   . Sulfa Antibiotics Hives, Shortness Of Breath and Rash  . Dilaudid [Hydromorphone Hcl] Other (See Comments)    Asystole per pt report  . Lexapro [Escitalopram Oxalate] Other (See Comments)    "I just did not like it."    Social History   Socioeconomic History  . Marital status: Single    Spouse name: Not on file  . Number of children: Not on file  . Years of education: Not on file  . Highest education level: Not on file  Occupational History  . Not on file  Social Needs  . Financial resource strain: Not on file  . Food insecurity    Worry: Not on file    Inability: Not on file  . Transportation needs    Medical: Not on file    Non-medical: Not on file  Tobacco Use  . Smoking status: Current Some Day Smoker    Packs/day: 0.25    Years: 15.00    Pack years: 3.75    Types: Cigarettes  . Smokeless tobacco: Never Used  Substance and Sexual Activity  . Alcohol use: Yes    Comment: occassionally  . Drug use: Yes    Types: Marijuana    Comment: once daily- last time used was night of 02/21/2019  . Sexual activity: Yes    Partners: Male    Birth control/protection: Other-see comments    Comment: BTL  Lifestyle  . Physical activity    Days per week: Not on file    Minutes per session: Not on file  . Stress: Not on file  Relationships  . Social Herbalist on phone: Not on file    Gets together: Not on file    Attends religious service: Not on file    Active member of club or organization: Not on file    Attends meetings of clubs or organizations: Not on file  Relationship status: Not on file  . Intimate  partner violence    Fear of current or ex partner: Not on file    Emotionally abused: Not on file    Physically abused: Not on file    Forced sexual activity: Not on file  Other Topics Concern  . Not on file  Social History Narrative  . Not on file    Family History  Problem Relation Age of Onset  . Depression Mother   . Diabetes Father   . Heart failure Father     Past Surgical History:  Procedure Laterality Date  . ABDOMINAL AORTOGRAM W/LOWER EXTREMITY N/A 11/14/2017   Procedure: ABDOMINAL AORTOGRAM W/LOWER EXTREMITY;  Surgeon: Waynetta Sandy, MD;  Location: [facility];  Service: Cardiovascular;  Laterality: N/A;  . ANGIOPLASTY ILLIAC ARTERY Left 02/25/2018   Procedure: BALLOON ANGIOPLASTY LEFT ILIAC ARTERY;  Surgeon: Conrad Church Hill, MD;  Location: [place];  Service: Vascular;  Laterality: Left;  . APPLICATION OF WOUND VAC Left 03/03/2018   Procedure: APPLICATION OF WOUND VAC;  Surgeon: Angelia Mould, MD;  Location: [place] [facility];  Service: Vascular;  Laterality: Left;  . CYSTOSCOPY W/ URETERAL STENT PLACEMENT Right 06/27/2018   Procedure: CYSTOSCOPY WITH RETROGRADE PYELOGRAM/URETERAL DOUBLE J STENT PLACEMENT;  Surgeon: Ceasar Mons, MD;  Location: [place];  Service: Urology;  Laterality: Right;  . CYSTOSCOPY WITH STENT PLACEMENT N/A 02/23/2019   Procedure: CYSTOSCOPY WITH RIGHT URETERAL STENT EXCHANGE/ LEFT URETERAL STENT PLACEMENT;  Surgeon: Ceasar Mons, MD;  Location: [facility];  Service: Urology;  Laterality: N/A;  . CYSTOSCOPY WITH STENT PLACEMENT Right 06/11/2019   Procedure: CYSTOSCOPY,RIGHT RETROGRADE WITH STENT PLACEMENT;  Surgeon: Irine Seal, MD;  Location: [facility];  Service: Urology;  Laterality: Right;  . CYSTOSCOPY/RETROGRADE/URETEROSCOPY/STONE EXTRACTION WITH BASKET Bilateral 05/01/2019   Procedure: CYSTOSCOPY/URETEROSCOPY/STONE EXTRACTION / LASER LITHOTRIPSY, BILATERAL URETEROSCOPY WITH URETERAL STONE EXTRACTION AND STENT EXCHANGE;   Surgeon: Ceasar Mons, MD;  Location: [facility];  Service: Urology;  Laterality: Bilateral;  . CYSTOSCOPY/URETEROSCOPY/HOLMIUM LASER/STENT PLACEMENT Right 06/27/2019   Procedure: CYSTOSCOPY/URETEROSCOPY/HOLMIUM LASER/STENT EXCHANGE;  Surgeon: Ceasar Mons, MD;  Location: [facility];  Service: Urology;  Laterality: Right;  . EMBOLECTOMY Left 02/24/2017   Procedure: Left Lower Extremity Embolectomy and Angiogram.;  Surgeon: Waynetta Sandy, MD;  Location: [place];  Service: Vascular;  Laterality: Left;  . INSERTION OF ILIAC STENT  02/24/2017   Procedure: INSERTION OF Common ILIAC STENT;  Surgeon: Waynetta Sandy, MD;  Location: [place];  Service: Vascular;;  . INTRAOPERATIVE ARTERIOGRAM Left 02/25/2018   Procedure: INTRA OPERATIVE ARTERIOGRAM WITH LEFT LEG RUNOFF;  Surgeon: Conrad Talihina, MD;  Location: [place];  Service: Vascular;  Laterality: Left;  . LOWER EXTREMITY ANGIOGRAM Left 02/24/2017   Procedure: Aortagram, Left lower extremity Run-off;  Surgeon: Waynetta Sandy, MD;  Location: [place];  Service: Vascular;  Laterality: Left;  . PATCH ANGIOPLASTY Left 02/25/2018   Procedure: PATCH ANGIOPLASTY LEFT SUPERFICIAL FEMORAL ARTERY WITH BOVINE PATCH;  Surgeon: Conrad Hodge, MD;  Location: [place];  Service: Vascular;  Laterality: Left;  . PERCUTANEOUS VENOUS THROMBECTOMY,LYSIS WITH INTRAVASCULAR ULTRASOUND (IVUS) Left 05/12/2018   Procedure: MECHANICAL THROMBECTOMY LEFT LEG, BALLOON ANGIOPLASTY LEFT ANTERIOR TIBIAL ARTERY, AORTOGRAM WITH LEFT LEG RUNOFF;  Surgeon: Serafina Mitchell, MD;  Location: [place];  Service: Vascular;  Laterality: Left;  . 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 THROMBECTOMY FEMORAL ARTERY Left 02/25/2018   Procedure: LEFT ILIAC AND POPLITEAL ARTERY THROMBECTOMY;  Surgeon: Conrad Smithsburg, MD;  Location: [place];  Service: Vascular;  Laterality: Left;  . TUBAL LIGATION    . WOUND DEBRIDEMENT Left 03/03/2018   Procedure:  DEBRIDEMENT WOUND;  Surgeon: Angelia Mould, MD;  Location: [place];  Service: Vascular;  Laterality: Left;  . WOUND EXPLORATION Left 03/03/2018   Procedure: WOUND EXPLORATION;  Surgeon: Angelia Mould, MD;  [facility];  Service: Vascular;  Laterality: Left;    ROS: Review of Systems Negative except as stated above  PHYSICAL EXAM: BP 127/84   Pulse (!) 123   Temp 99.5 F (37.5 C) (Oral)   Resp 16   Wt 260 lb (117.9 kg)   SpO2 97%   BMI 40.72 kg/m   Physical Exam  General appearance - alert, well appearing, and in no distress Mental status - normal mood, behavior, speech, dress, motor activity, and thought processes Mouth - mucous membranes moist, pharynx normal without lesions Neck - supple, no significant adenopathy Chest - clear to auscultation, no wheezes, rales or rhonchi, symmetric air entry Heart - normal rate, regular rhythm, normal S1, S2, no murmurs, rubs, clicks or gallops Extremities - peripheral pulses normal, no pedal edema, no clubbing or cyanosis   CMP Latest Ref Rng & Units 06/26/2019 06/15/2019 06/14/2019  Glucose 70 - 99 mg/dL 183(H) 171(H) 143(H)  BUN 6 - 20 mg/dL 20 20 19   Creatinine 0.44 - 1.00 mg/dL 1.98(H) 2.15(H) 2.30(H)  Sodium 135 - 145 mmol/L 136 137 136  Potassium 3.5 - 5.1 mmol/L 5.0 4.5 5.0  Chloride 98 - 111 mmol/L 106 104 104  CO2 22 - 32 mmol/L 22 23 24   Calcium 8.9 - 10.3 mg/dL 9.0 9.3 9.2  Total Protein 6.5 - 8.1 g/dL - 6.6 -  Total Bilirubin 0.3 - 1.2 mg/dL - 0.2(L) -  Alkaline Phos 38 - 126 U/L - 75 -  AST 15 - 41 U/L - 11(L) -  ALT 0 - 44 U/L - 11 -   Lipid Panel     Component Value Date/Time   CHOL 84 06/27/2018 0126   CHOL 151 01/27/2018 1157   TRIG 233 (H) 06/27/2018 0126   HDL 13 (L) 06/27/2018 0126   HDL 36 (L) 01/27/2018 1157   CHOLHDL 6.5 06/27/2018 0126   VLDL 47 (H) 06/27/2018 0126   LDLCALC 24 06/27/2018 0126   LDLCALC 82 01/27/2018 1157    CBC    Component Value Date/Time   WBC 6.7 06/15/2019  0442   RBC 3.67 (L) 06/15/2019 0442   HGB 10.5 (L) 06/15/2019 0442   HGB 12.3 07/25/2018 1711   HCT 34.4 (L) 06/15/2019 0442   HCT 37.8 07/25/2018 1711   PLT 348 06/15/2019 0442   PLT 490 (H) 01/27/2018 1157   MCV 93.7 06/15/2019 0442   MCV 87 07/25/2018 1711   MCH 28.6 06/15/2019 0442   MCHC 30.5 06/15/2019 0442   RDW 16.4 (H) 06/15/2019 0442   RDW 16.4 (H) 07/25/2018 1711   LYMPHSABS 2.2 06/14/2019 0444   LYMPHSABS 3.4 (H) 07/25/2018 1711   MONOABS 0.7 06/14/2019 0444   EOSABS 0.6 (H) 06/14/2019 0444   EOSABS 0.6 (H) 07/25/2018 1711   BASOSABS 0.1 06/14/2019 0444   BASOSABS 0.1 07/25/2018 1711    ASSESSMENT AND PLAN:  1. 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  We will have her follow-up in about 3 weeks with those readings.  Healthy eating habits encouraged - Microalbumin / creatinine urine ratio - POCT glucose (manual entry)  2. Essential hypertension Close to goal.  Refill given on amlodipine - amLODipine (NORVASC) 5 MG tablet; Take 1 tablet (5 mg total) by mouth daily.  Dispense: 30 tablet; Refill: 3  3. Tobacco dependence Commended her on cutting back.  Encouraged her to continue cutting back with the goal of quitting.  Less than 5 minutes spent on counseling - buPROPion (WELLBUTRIN SR) 150 MG 12 hr tablet; Take 1 tablet (150 mg total) by mouth 2 (two) times daily.  Dispense: 60 tablet; Refill: 3  4. Tachycardia EKG today showed sinus rhythm with rate of 120 and short PR interval.  Will check TSH and a D-dimer.  However even if PE, we will continue same management with Coumadin.  Patient thinks it is likely because she has been out of the Vistaril which he takes for anxiety.  Again we will bring her back in about 3 weeks to see whether this has resolved - TSH - EKG 12-Lead - D-dimer, quantitative (not at [facility])  5. Vitamin B 12 deficiency 6. Folate  deficiency I went over labs with her.  Strongly encouraged her to take the supplements.  I have sent refills to our pharmacy.  I will plan to recheck B12 level on her follow-up visit  7. Stage 3b chronic kidney disease Diabetes likely playing a role but also she has some obstructive issues.  Currently does not have insurance or the orange card to refer to nephrology.  Will discuss referring her to [facility] on next visit  8. Thrombosis of left iliac artery (HCC) - INR  9. Anxiety - sertraline (ZOLOFT) 50 MG tablet; Take 1 tablet (50 mg total) by mouth daily. 1/2 tab daily x 2 wks then 1 tab PO daily  Dispense: 30 tablet; Refill: 3  10. Gastroesophageal reflux disease without esophagitis - omeprazole (PRILOSEC) 20 MG capsule; Take 1 capsule (20 mg total) by mouth 2 (two) times daily before a meal.  Dispense: 60 capsule; Refill: 4    Patient was given the opportunity to ask questions.  Patient verbalized understanding of the plan and was able to repeat key elements of the plan.   Orders Placed This Encounter  Procedures  . Microalbumin / creatinine urine ratio  . POCT glucose (manual entry)  . INR     Requested Prescriptions   Pending Prescriptions Disp Refills  . buPROPion (WELLBUTRIN SR) 150 MG 12 hr tablet 60 tablet 3    Sig: Take 1 tablet (150 mg total) by mouth 2 (two) times daily.  . sertraline (ZOLOFT) 50 MG tablet 30 tablet 3    Sig: Take 1 tablet (50 mg total) by mouth daily. 1/2 tab daily x 2 wks then 1 tab PO daily  . hydrOXYzine (ATARAX/VISTARIL) 50 MG tablet 90 tablet 3    Sig: Take 1 tablet (50 mg total) by mouth 3 (three) times daily as needed for itching or anxiety.  . vitamin B-12 (CYANOCOBALAMIN) 1000 MCG tablet 60 tablet 3    Sig: Take 2 tablets (2,000 mcg total) by mouth daily.  . folic acid (FOLVITE) 1 MG tablet 30 tablet 2    Sig: Take 1 tablet (1 mg total) by mouth daily.  Marland Kitchen amLODipine (NORVASC) 5 MG tablet 30 tablet 3    Sig: Take 1 tablet (5  mg total) by mouth daily.  Marland Kitchen omeprazole (PRILOSEC) 20 MG capsule 60 capsule 0    Sig: Take  2 capsules (40 mg total) by mouth daily.  Marland Kitchen gabapentin (NEURONTIN) 400 MG capsule 90 capsule 4    Sig: Take 1 capsule (400 mg total) by mouth 3 (three) times daily.    No follow-ups on file.  Karle Plumber, MD, FACP

## 2019-07-30 NOTE — Patient Instructions (Signed)
Please schedule pt a INR check with Lurena Joiner on November 3rd around 10-1030am

## 2019-07-30 NOTE — Progress Notes (Signed)
   Subjective:    Patient ID: Terri Sparks, female    DOB: Jan 22, 1976, 43 y.o.   MRN: [id number]  HPI  Patient seen for AUB follow up. Has been having regular, but heavy menses. Uses multiple pads, having clots. Felt that megace made it worse. [name] last year reviewed. Would like hysterectomy. Is on coumadin for peripheral artery disease, but no longer on plavix.  Review of Systems     Objective:   Physical Exam Constitutional:      Appearance: Normal appearance.  Cardiovascular:     Rate and Rhythm: Normal rate.     Pulses: Normal pulses.  Pulmonary:     Effort: Pulmonary effort is normal.  Neurological:     Mental Status: She is alert.         Assessment & Plan:  1. Abnormal uterine bleeding (AUB) Consult with Gyn for hysterectomy.

## 2019-07-31 LAB — MICROALBUMIN / CREATININE URINE RATIO
Creatinine, Urine: 159.2 mg/dL
Microalb/Creat Ratio: 225 mg/g creat — ABNORMAL HIGH (ref 0–29)
Microalbumin, Urine: 358.9 ug/mL

## 2019-07-31 LAB — TSH: TSH: 0.983 u[IU]/mL (ref 0.450–4.500)

## 2019-07-31 LAB — D-DIMER, QUANTITATIVE: D-DIMER: 0.52 mg/L FEU — ABNORMAL HIGH (ref 0.00–0.49)

## 2019-07-31 MED FILL — TRUE METRIX GLUCOSE TEST ST: 30 days supply | Qty: 100 | Fill #0

## 2019-08-01 ENCOUNTER — Telehealth: Payer: Self-pay | Admitting: Internal Medicine

## 2019-08-01 NOTE — Telephone Encounter (Signed)
PC placed to pt today to discuss lab results.  Pt informed that amount of protein in the urine has decreased.  TSH was nl.  D-dimer mildly elevated.  We agreed to have her just continue taking Coumadin consistently rather than additional testing.  She has f/u with me in 2 wks.

## 2019-08-07 ENCOUNTER — Ambulatory Visit: Payer: Medicaid Other | Admitting: Pharmacist

## 2019-08-16 ENCOUNTER — Ambulatory Visit: Payer: Medicaid Other | Attending: Internal Medicine | Admitting: Internal Medicine

## 2019-08-16 ENCOUNTER — Other Ambulatory Visit: Payer: Self-pay

## 2019-08-16 ENCOUNTER — Telehealth (HOSPITAL_COMMUNITY): Payer: Self-pay | Admitting: *Deleted

## 2019-08-16 ENCOUNTER — Encounter: Payer: Self-pay | Admitting: Internal Medicine

## 2019-08-16 VITALS — BP 123/82 | HR 95 | Temp 98.2°F | Resp 16 | Wt 216.0 lb

## 2019-08-16 DIAGNOSIS — F17200 Nicotine dependence, unspecified, uncomplicated: Secondary | ICD-10-CM

## 2019-08-16 DIAGNOSIS — F17210 Nicotine dependence, cigarettes, uncomplicated: Secondary | ICD-10-CM

## 2019-08-16 DIAGNOSIS — K219 Gastro-esophageal reflux disease without esophagitis: Secondary | ICD-10-CM

## 2019-08-16 DIAGNOSIS — R079 Chest pain, unspecified: Secondary | ICD-10-CM

## 2019-08-16 DIAGNOSIS — IMO0002 Reserved for concepts with insufficient information to code with codable children: Secondary | ICD-10-CM

## 2019-08-16 DIAGNOSIS — R1110 Vomiting, unspecified: Secondary | ICD-10-CM

## 2019-08-16 DIAGNOSIS — N1832 Chronic kidney disease, stage 3b: Secondary | ICD-10-CM

## 2019-08-16 DIAGNOSIS — E1165 Type 2 diabetes mellitus with hyperglycemia: Secondary | ICD-10-CM

## 2019-08-16 DIAGNOSIS — N1330 Unspecified hydronephrosis: Secondary | ICD-10-CM

## 2019-08-16 DIAGNOSIS — I745 Embolism and thrombosis of iliac artery: Secondary | ICD-10-CM

## 2019-08-16 DIAGNOSIS — E1121 Type 2 diabetes mellitus with diabetic nephropathy: Secondary | ICD-10-CM

## 2019-08-16 LAB — GLUCOSE, POCT (MANUAL RESULT ENTRY): POC Glucose: 185 mg/dl — AB (ref 70–99)

## 2019-08-16 LAB — POCT INR: INR: 1.6 — AB (ref 2.0–3.0)

## 2019-08-16 MED ORDER — NITROGLYCERIN 0.4 MG SL SUBL: 0.4000 mg | SUBLINGUAL_TABLET | SUBLINGUAL | 3 refills | Status: DC | PRN

## 2019-08-16 MED FILL — NITROGLYCERIN 0.4 MG TAB SL: 0.4 | 25 days supply | Qty: 25 | Fill #0

## 2019-08-16 MED FILL — WARFARIN SODIUM 5 MG TABLET: 5 | 42 days supply | Qty: 60 | Fill #2

## 2019-08-16 MED FILL — ?BASAGLAR 100 UNITS/ML KWPE: 100 | 30 days supply | Qty: 21 | Fill #3

## 2019-08-16 MED FILL — ?HUMALOG 100 UNITS/ML KWIKP: 100 | 33 days supply | Qty: 15 | Fill #3

## 2019-08-16 NOTE — Patient Instructions (Addendum)
From [facility]:  Please remember to take your warfarin. Take 1 extra tablet today then continue normal regimen of 2 tablets on MWF, with 1 tablet all other days.   Please give an appointment to see [facility] in 2 weeks.   I have referred you to the cardiologist for further evaluation of chest pains.  Try to work on cutting back further on smoking.  Use the sublingual nitroglycerin as needed for chest pains as discussed today.  Please call the urologist to reschedule your appointment.  I have referred you to a kidney specialist.  It will most likely have to be at [facility].

## 2019-08-16 NOTE — Telephone Encounter (Signed)
The above patient or their representative was contacted and gave the following answers to these questions:         Do you have any of the following symptoms?    NO  Fever                    Cough                   Shortness of breath  Do  you have any of the following other symptoms? n   muscle pain         vomiting,        diarrhea        rash         weakness        red eye        abdominal pain         bruising          bruising or bleeding              joint pain           severe headache    Have you been in contact with someone who was or has been sick in the past 2 weeks?  NO  Yes                 Unsure                         Unable to assess   Does the person that you were in contact with have any of the following symptoms?   Cough         shortness of breath           muscle pain         vomiting,            diarrhea            rash            weakness           fever            red eye           abdominal pain           bruising  or  bleeding                joint pain                severe headache                 COMMENTS OR ACTION PLAN FOR THIS PATIENT:         q 

## 2019-08-16 NOTE — Progress Notes (Signed)
  Patient ID: Terri Sparks, female    DOB: 12/31/1975  MRN: [id number]  CC: Follow-up   Subjective: Terri Sparks is a 43 y.o. female who presents for 2 wks f/u Her concerns today include:  43-year-old female with history ofDM type2 with macroalbumin, CKD 3, HTN, anxiey, tob dep,PVDwith hx LLEacute limb ischemia requiring stent to the iliac artery and embolectomy followed by Dr. Cainof vascular surgery, hyperparathyroidism s/p resection of parathryroid adenoma 2010, GERD,IDA,and anxiety/dep  Vit B12/folate def: Since last visit she did fill prescription for B12 and folic acid and has been taking consistently.  Urology:  Miss appt her follow-up appointment with urology on 08/07/2019.  She states she has called and tried to reschedule but they have not gotten back to her as yet.  Last renal ultrasound done in September of this year revealed right hydronephrosis, hydroureter and significant perinephritic edema secondary to a 5 mm mid to distal right ureteral calculus.  Had a stent placed in the right ureter on 06/27/2019.  However she states that it fell out within 7 hours  DM:  Did get stripes.  Checking BS TID before meals.  She kept a log but forgot to bring the log with her today.  Reports morning BS in 80-125, before lunch in 110-120 and before dinner about the same -doing better with eating habits but "sometimes I can and can not eat."  Still gets intermittent vomiting at times.  She has never had a gastric emptying study Omeprazole helps with GERD symptoms but she gets reflux sometimes at nights 3-4 x a wk. she is wondering about whether the dose of omeprazole needs to be increased  CKD 3-4: She had urine microalbumin checked on last visit.  The amount of protein in the urine has decreased from 674 down to 225.  Estimated GFR fluctuates between 25-30.  She still makes good urine.  CP intermittently LT upper chest pain that sometimes radiates to LT arm.  Throat feels cold when it  comes on.  Occurs several times a wk but lately more often  Can occur at rest or when she is up moving around.  Can also occur when she feels mentally overwhelm Heart dis in maternal GF Dec smoking more to 1/3 pk a day since being back on Wellbutrin.   She denies cocaine use.  Admits that she still uses marijuana.  At point of care INR today by the clinical pharmacist.  It was 1.6.  Patient reports she had been off of Coumadin most of last week but did start taking it again 4 days ago. Patient Active Problem List   Diagnosis Date Noted  . Stage 3b chronic kidney disease 07/30/2019  . Iron deficiency anemia due to chronic blood loss 06/12/2019  . Vitamin B12 deficiency 06/12/2019  . Folate deficiency 06/12/2019  . Constipation 06/12/2019  . Prolonged QT interval   . Ureteral stone with hydronephrosis 06/11/2019  . Acute renal failure superimposed on stage 3 chronic kidney disease (HCC) 06/26/2018  . Right ureteral stone 06/26/2018  . Essential hypertension 06/26/2018  . Adrenal nodule (HCC) 06/21/2018  . Lung nodules 06/21/2018  . Abnormal uterine bleeding (AUB) 05/14/2018  . Hemorrhagic cyst of right ovary 05/14/2018  . Ischemia of extremity 05/12/2018  . Former smoker 03/14/2018  . Thrombosis of left iliac artery (HCC) 02/25/2018  . Thromboembolism (HCC) 02/25/2018  . Microalbuminuria 09/29/2017  . Insomnia 09/29/2017  . Immunization due 08/01/2017  . Anxiety and depression 04/28/2017  . Neuropathy of left   lower extremity 04/28/2017  . Iliac artery occlusion, left (HCC) 02/25/2017  . Tobacco abuse 02/25/2017  . Diabetes mellitus, type II (HCC) 02/25/2017  . Peripheral vascular disease of lower extremity (HCC) 02/24/2017  . GERD (gastroesophageal reflux disease)   . Hyperparathyroidism      Current Outpatient Medications on File Prior to Visit  Medication Sig Dispense Refill  . albuterol (PROAIR HFA) 108 (90 Base) MCG/ACT inhaler Inhale 2 puffs into the lungs every 6 (six)  hours as needed for wheezing or shortness of breath. 18 g 0  . amLODipine (NORVASC) 5 MG tablet Take 1 tablet (5 mg total) by mouth daily. 30 tablet 3  . aspirin EC 81 MG tablet Take 81 mg by mouth daily.    . blood glucose meter kit and supplies Dispense based on patient and insurance preference. Use up to four times daily as directed. (FOR ICD-9 250.00, 250.01). 1 each 0  . Blood Glucose Monitoring Suppl (TRUE METRIX METER) w/Device KIT Use as directed 1 kit 0  . buPROPion (WELLBUTRIN SR) 150 MG 12 hr tablet Take 1 tablet (150 mg total) by mouth 2 (two) times daily. 60 tablet 3  . ferrous sulfate 325 (65 FE) MG tablet Take 1 tablet (325 mg total) by mouth daily with breakfast. 100 tablet 1  . folic acid (FOLVITE) 1 MG tablet Take 1 tablet (1 mg total) by mouth daily. 30 tablet 2  . gabapentin (NEURONTIN) 400 MG capsule Take 1 capsule (400 mg total) by mouth 3 (three) times daily. 90 capsule 4  . glucose blood (TRUE METRIX BLOOD GLUCOSE TEST) test strip Use as instructed 100 each 12  . glucose blood test strip Use as instructed 100 each 12  . hydrOXYzine (ATARAX/VISTARIL) 50 MG tablet Take 1 tablet (50 mg total) by mouth 3 (three) times daily as needed for itching or anxiety. 90 tablet 3  . insulin aspart (NOVOLOG) 100 UNIT/ML injection Inject 5-15 Units into the skin 3 (three) times daily before meals.    . insulin glargine (LANTUS) 100 UNIT/ML injection Inject 73 Units into the skin at bedtime.    . Insulin Pen Needle 31G X 5 MM MISC Use with Lantus and Novolog injections. 100 each 11  . Insulin Syringe-Needle U-100 (BD INSULIN SYRINGE ULTRAFINE) 31G X 5/16" 0.5 ML MISC Use as directed 100 each 3  . lip balm (CARMEX) ointment Apply topically as needed for lip care. 7 g 0  . loratadine (CLARITIN) 10 MG tablet Take 1 tablet (10 mg total) by mouth daily as needed for allergies. 30 tablet 0  . menthol-cetylpyridinium (CEPACOL) 3 MG lozenge Take 1 lozenge (3 mg total) by mouth as needed for sore  throat. 100 tablet 12  . nicotine (NICODERM CQ - DOSED IN MG/24 HOURS) 14 mg/24hr patch Place 1 patch (14 mg total) onto the skin daily. 28 patch 0  . omeprazole (PRILOSEC) 20 MG capsule Take 1 capsule (20 mg total) by mouth 2 (two) times daily before a meal. 60 capsule 4  . polyethylene glycol (MIRALAX / GLYCOLAX) 17 g packet Take 17 g by mouth 2 (two) times daily. 14 each 0  . senna-docusate (SENOKOT-S) 8.6-50 MG tablet Take 1 tablet by mouth 2 (two) times daily. 60 tablet 0  . sertraline (ZOLOFT) 50 MG tablet Take 1 tablet (50 mg total) by mouth daily. 1/2 tab daily x 2 wks then 1 tab PO daily 30 tablet 3  . sodium chloride (OCEAN) 0.65 % SOLN nasal spray Place 1 spray   into both nostrils as needed for congestion (nose irritation). 30 mL 0  . TRUEPLUS LANCETS 28G MISC Use as directed 100 each 6  . vitamin B-12 (CYANOCOBALAMIN) 1000 MCG tablet Take 2 tablets (2,000 mcg total) by mouth daily. 60 tablet 3  . warfarin (COUMADIN) 5 MG tablet TAKE 2 TABLETS ON MONDAY, WEDNESDAY AND FRIDAY WITH 1 TABLET ALL OTHER DAYS. (Patient taking differently: Take 5-10 mg by mouth See admin instructions. Take 10 Monday, Wednesday and Friday and 5 mg on Tuesday, Thursday, Saturday and Sunday) 60 tablet 3   No current facility-administered medications on file prior to visit.     Allergies  Allergen Reactions  . Ciprofloxacin Hcl Hives  . Macrobid [Nitrofurantoin Macrocrystal] Hives, Shortness Of Breath and Rash  . Other Hives, Shortness Of Breath and Rash    NO "-CILLINS"!!!  . Penicillins Shortness Of Breath    Had had cephalosporins without incident Has patient had a PCN reaction causing immediate rash, facial/tongue/throat swelling, SOB or lightheadedness with hypotension: Yes Has patient had a PCN reaction causing severe rash involving mucus membranes or skin necrosis: Unk Has patient had a PCN reaction that required hospitalization: Unk Has patient had a PCN reaction occurring within the last 10 years: No  If all of the above answers are "NO", then may proceed with Cephalosporin use.   . Sulfa Antibiotics Hives, Shortness Of Breath and Rash  . Dilaudid [Hydromorphone Hcl] Other (See Comments)    Asystole per pt report  . Lexapro [Escitalopram Oxalate] Other (See Comments)    "I just did not like it."    Social History   Socioeconomic History  . Marital status: Single    Spouse name: Not on file  . Number of children: Not on file  . Years of education: Not on file  . Highest education level: Not on file  Occupational History  . Not on file  Social Needs  . Financial resource strain: Not on file  . Food insecurity    Worry: Not on file    Inability: Not on file  . Transportation needs    Medical: Not on file    Non-medical: Not on file  Tobacco Use  . Smoking status: Current Some Day Smoker    Packs/day: 0.25    Years: 15.00    Pack years: 3.75    Types: Cigarettes  . Smokeless tobacco: Never Used  Substance and Sexual Activity  . Alcohol use: Yes    Comment: occassionally  . Drug use: Yes    Types: Marijuana    Comment: once daily- last time used was night of 02/21/2019  . Sexual activity: Yes    Partners: Male    Birth control/protection: Other-see comments    Comment: BTL  Lifestyle  . Physical activity    Days per week: Not on file    Minutes per session: Not on file  . Stress: Not on file  Relationships  . Social connections    Talks on phone: Not on file    Gets together: Not on file    Attends religious service: Not on file    Active member of club or organization: Not on file    Attends meetings of clubs or organizations: Not on file    Relationship status: Not on file  . Intimate partner violence    Fear of current or ex partner: Not on file    Emotionally abused: Not on file    Physically abused: Not on file      Forced sexual activity: Not on file  Other Topics Concern  . Not on file  Social History Narrative  . Not on file    Family History   Problem Relation Age of Onset  . Depression Mother   . Diabetes Father   . Heart failure Father     Past Surgical History:  Procedure Laterality Date  . ABDOMINAL AORTOGRAM W/LOWER EXTREMITY N/A 11/14/2017   Procedure: ABDOMINAL AORTOGRAM W/LOWER EXTREMITY;  Surgeon: Cain, Brandon Christopher, MD;  Location: [facility];  Service: Cardiovascular;  Laterality: N/A;  . ANGIOPLASTY ILLIAC ARTERY Left 02/25/2018   Procedure: BALLOON ANGIOPLASTY LEFT ILIAC ARTERY;  Surgeon: Chen, Brian L, MD;  Location: [facility];  Service: Vascular;  Laterality: Left;  . APPLICATION OF WOUND VAC Left 03/03/2018   Procedure: APPLICATION OF WOUND VAC;  Surgeon: Dickson, Christopher S, MD;  Location: [facility];  Service: Vascular;  Laterality: Left;  . CYSTOSCOPY W/ URETERAL STENT PLACEMENT Right 06/27/2018   Procedure: CYSTOSCOPY WITH RETROGRADE PYELOGRAM/URETERAL DOUBLE J STENT PLACEMENT;  Surgeon: Winter, Christopher Aaron, MD;  Location: [facility];  Service: Urology;  Laterality: Right;  . CYSTOSCOPY WITH STENT PLACEMENT N/A 02/23/2019   Procedure: CYSTOSCOPY WITH RIGHT URETERAL STENT EXCHANGE/ LEFT URETERAL STENT PLACEMENT;  Surgeon: Winter, Christopher Aaron, MD;  Location: [facility];  Service: Urology;  Laterality: N/A;  . CYSTOSCOPY WITH STENT PLACEMENT Right 06/11/2019   Procedure: CYSTOSCOPY,RIGHT RETROGRADE WITH STENT PLACEMENT;  Surgeon: Wrenn, John, MD;  Location: [facility];  Service: Urology;  Laterality: Right;  . CYSTOSCOPY/RETROGRADE/URETEROSCOPY/STONE EXTRACTION WITH BASKET Bilateral 05/01/2019   Procedure: CYSTOSCOPY/URETEROSCOPY/STONE EXTRACTION / LASER LITHOTRIPSY, BILATERAL URETEROSCOPY WITH URETERAL STONE EXTRACTION AND STENT EXCHANGE;  Surgeon: Winter, Christopher Aaron, MD;  Location: [facility];  Service: Urology;  Laterality: Bilateral;  . CYSTOSCOPY/URETEROSCOPY/HOLMIUM LASER/STENT PLACEMENT Right 06/27/2019   Procedure: CYSTOSCOPY/URETEROSCOPY/HOLMIUM LASER/STENT EXCHANGE;  Surgeon: Winter,  Christopher Aaron, MD;  Location: [facility];  Service: Urology;  Laterality: Right;  . EMBOLECTOMY Left 02/24/2017   Procedure: Left Lower Extremity Embolectomy and Angiogram.;  Surgeon: Cain, Brandon Christopher, MD;  Location: [facility];  Service: Vascular;  Laterality: Left;  . INSERTION OF ILIAC STENT  02/24/2017   Procedure: INSERTION OF Common ILIAC STENT;  Surgeon: Cain, Brandon Christopher, MD;  Location: [facility];  Service: Vascular;;  . INTRAOPERATIVE ARTERIOGRAM Left 02/25/2018   Procedure: INTRA OPERATIVE ARTERIOGRAM WITH LEFT LEG RUNOFF;  Surgeon: Chen, Brian L, MD;  Location: [facility];  Service: Vascular;  Laterality: Left;  . LOWER EXTREMITY ANGIOGRAM Left 02/24/2017   Procedure: Aortagram, Left lower extremity Run-off;  Surgeon: Cain, Brandon Christopher, MD;  Location: [facility];  Service: Vascular;  Laterality: Left;  . PATCH ANGIOPLASTY Left 02/25/2018   Procedure: PATCH ANGIOPLASTY LEFT SUPERFICIAL FEMORAL ARTERY WITH BOVINE PATCH;  Surgeon: Chen, Brian L, MD;  Location: [facility];  Service: Vascular;  Laterality: Left;  . PERCUTANEOUS VENOUS THROMBECTOMY,LYSIS WITH INTRAVASCULAR ULTRASOUND (IVUS) Left 05/12/2018   Procedure: MECHANICAL THROMBECTOMY LEFT LEG, BALLOON ANGIOPLASTY LEFT ANTERIOR TIBIAL ARTERY, AORTOGRAM WITH LEFT LEG RUNOFF;  Surgeon: Brabham, Vance W, MD;  Location: [facility];  Service: Vascular;  Laterality: Left;  . 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 THROMBECTOMY FEMORAL ARTERY Left 02/25/2018   Procedure: LEFT ILIAC AND POPLITEAL ARTERY THROMBECTOMY;  Surgeon: Chen, Brian L, MD;  Location: [facility];  Service: Vascular;  Laterality: Left;  . TUBAL LIGATION    . WOUND DEBRIDEMENT Left 03/03/2018   Procedure: DEBRIDEMENT WOUND;  Surgeon: Dickson, Christopher S, MD;  Location:   [facility];  Service: Vascular;  Laterality: Left;  . WOUND EXPLORATION Left 03/03/2018   Procedure: WOUND EXPLORATION;  Surgeon: Dickson, Christopher S, MD;  Location: [facility];  Service: Vascular;  Laterality: Left;    ROS:  Review of Systems Negative except as stated above  PHYSICAL EXAM: BP 123/82   Pulse 95   Temp 98.2 F (36.8 C) (Oral)   Resp 16   Wt 216 lb (98 kg)   SpO2 97%   BMI 33.83 kg/m   Physical Exam  General appearance - alert, well appearing, and in no distress Mental status - normal mood, behavior, speech, dress, motor activity, and thought processes Mouth - mucous membranes moist, pharynx normal without lesions Chest - clear to auscultation, no wheezes, rales or rhonchi, symmetric air entry Heart - normal rate, regular rhythm, normal S1, S2, no murmurs, rubs, clicks or gallops Extremities - peripheral pulses normal, no pedal edema, no clubbing or cyanosis   CMP Latest Ref Rng & Units 06/26/2019 06/15/2019 06/14/2019  Glucose 70 - 99 mg/dL 183(H) 171(H) 143(H)  BUN 6 - 20 mg/dL 20 20 19  Creatinine 0.44 - 1.00 mg/dL 1.98(H) 2.15(H) 2.30(H)  Sodium 135 - 145 mmol/L 136 137 136  Potassium 3.5 - 5.1 mmol/L 5.0 4.5 5.0  Chloride 98 - 111 mmol/L 106 104 104  CO2 22 - 32 mmol/L 22 23 24  Calcium 8.9 - 10.3 mg/dL 9.0 9.3 9.2  Total Protein 6.5 - 8.1 g/dL - 6.6 -  Total Bilirubin 0.3 - 1.2 mg/dL - 0.2(L) -  Alkaline Phos 38 - 126 U/L - 75 -  AST 15 - 41 U/L - 11(L) -  ALT 0 - 44 U/L - 11 -   Lipid Panel     Component Value Date/Time   CHOL 84 06/27/2018 0126   CHOL 151 01/27/2018 1157   TRIG 233 (H) 06/27/2018 0126   HDL 13 (L) 06/27/2018 0126   HDL 36 (L) 01/27/2018 1157   CHOLHDL 6.5 06/27/2018 0126   VLDL 47 (H) 06/27/2018 0126   LDLCALC 24 06/27/2018 0126   LDLCALC 82 01/27/2018 1157    CBC    Component Value Date/Time   WBC 6.7 06/15/2019 0442   RBC 3.67 (L) 06/15/2019 0442   HGB 10.5 (L) 06/15/2019 0442   HGB 12.3 07/25/2018 1711   HCT 34.4 (L) 06/15/2019 0442   HCT 37.8 07/25/2018 1711   PLT 348 06/15/2019 0442   PLT 490 (H) 01/27/2018 1157   MCV 93.7 06/15/2019 0442   MCV 87 07/25/2018 1711   MCH 28.6 06/15/2019 0442   MCHC 30.5 06/15/2019 0442   RDW 16.4  (H) 06/15/2019 0442   RDW 16.4 (H) 07/25/2018 1711   LYMPHSABS 2.2 06/14/2019 0444   LYMPHSABS 3.4 (H) 07/25/2018 1711   MONOABS 0.7 06/14/2019 0444   EOSABS 0.6 (H) 06/14/2019 0444   EOSABS 0.6 (H) 07/25/2018 1711   BASOSABS 0.1 06/14/2019 0444   BASOSABS 0.1 07/25/2018 1711    ASSESSMENT AND PLAN: 1. 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  She will continue current dose of insulins being Lantus and NovoLog.  Healthy eating habits discussed and encouraged - Glucose (CBG) - Ambulatory referral to Nephrology  2. Stage 3b chronic kidney disease Part of this may be postobstructive as she has had ongoing issues with hydronephrosis and ureteral stones.  However I think we should get her in with the nephrologist.  She does not have insurance so   this may have to be through [facility].  Told to not take NSAIDs. - Ambulatory referral to Nephrology  3. Chest pain in adult Patient with risk factors for heart disease.  I recommend referral to cardiology. In the meantime I have given some nitroglycerin to use as needed for chest pains.  I went over with her how to use it. Strongly advised to quit smoking - nitroGLYCERIN (NITROSTAT) 0.4 MG SL tablet; Place 1 tablet (0.4 mg total) under the tongue every 5 (five) minutes as needed for chest pain.  Dispense: 50 tablet; Refill: 3 - Ambulatory referral to Cardiology  4. Thrombosis of left iliac artery (HCC) Instructions given by the clinical pharmacist on how to adjust her Coumadin. Advised and encourage compliance with Coumadin - INR  5. Recurrent vomiting - NM GASTRIC EMPTYING SOLID LIQUID BOTH W/SB TRANSIT; Future  6. Gastroesophageal reflux disease without esophagitis Given her kidney function, I do not recommend increasing omeprazole.  She is currently on 20 mg twice a day.  Advised to eat her last meal at least 2 to 3 hours before she lays down at nights and to  sleep with her head slightly elevated  7. Tobacco dependence Advised to quit.  Commended her on cutting back more since our last visit.  Encouraged her to continue to cut back and set a quit date  8. Hydronephrosis, unspecified hydronephrosis type Encourage her to call urology again to try to reschedule her appointment     Patient was given the opportunity to ask questions.  Patient verbalized understanding of the plan and was able to repeat key elements of the plan.   Orders Placed This Encounter  Procedures  . Glucose (CBG)  . INR     Requested Prescriptions    No prescriptions requested or ordered in this encounter    No follow-ups on file.  Deborah Johnson, MD, FACP 

## 2019-08-17 ENCOUNTER — Ambulatory Visit (INDEPENDENT_AMBULATORY_CARE_PROVIDER_SITE_OTHER): Payer: Self-pay | Admitting: Vascular Surgery

## 2019-08-17 ENCOUNTER — Encounter: Payer: Self-pay | Admitting: Vascular Surgery

## 2019-08-17 ENCOUNTER — Ambulatory Visit (INDEPENDENT_AMBULATORY_CARE_PROVIDER_SITE_OTHER)
Admission: RE | Admit: 2019-08-17 | Discharge: 2019-08-17 | Disposition: A | Discharge status: Home / Self Care | Payer: Medicaid Other | Source: Ambulatory Visit | Attending: Vascular Surgery | Admitting: Vascular Surgery

## 2019-08-17 ENCOUNTER — Ambulatory Visit (HOSPITAL_COMMUNITY)
Admission: RE | Admit: 2019-08-17 | Discharge: 2019-08-17 | Disposition: A | Discharge status: Home / Self Care | Payer: Medicaid Other | Source: Ambulatory Visit | Attending: Vascular Surgery | Admitting: Vascular Surgery

## 2019-08-17 VITALS — BP 154/86 | HR 93 | Temp 98.0°F | Resp 20 | Ht 67.0 in | Wt 273.9 lb

## 2019-08-17 DIAGNOSIS — I739 Peripheral vascular disease, unspecified: Secondary | ICD-10-CM

## 2019-08-17 DIAGNOSIS — I779 Disorder of arteries and arterioles, unspecified: Secondary | ICD-10-CM | POA: Insufficient documentation

## 2019-08-17 NOTE — Progress Notes (Signed)
Patient ID: Terri Sparks, female   DOB: [birth year], 43 y.o.   MRN: [id number]  Reason for Consult: Follow-up   Referred by Ladell Pier, MD  Subjective:     HPI:  Terri Sparks is a 43 y.o. female well-known to me with previous left lower extremity thromboembolectomy and wound that healed by secondary intention in her groin.  Subsequent underwent percutaneous thrombectomy of occluded stents on that side.  This was greater than 1 year ago.  She does continue to smoke some.  She is on Coumadin as well as aspirin and she takes this religiously.  She does have issues with persistent drug use but states she is attempting to get this under control as well.  She is walking without limitation.  Feet feeling good.  Denies any tissue loss or ulceration.  Past Medical History:  Diagnosis Date  . Anxiety   . Depression   . Diabetes ([place])    Type 2  . GERD (gastroesophageal reflux disease)   . History of blood clots    ,DVT-left leg, and early 2000, had blood clot behind left breast  . History of kidney stones   . Hypercalcemia   . Hyperparathyroidism   . Hypertension    had been on Lisinopril and PCP took her off it 4 months ago  . Iron deficiency anemia   . Left thyroid nodule   . Mass of right ovary   . Nephrolithiasis   . PAOD (peripheral arterial occlusive disease) ([place])   . Peripheral vascular disease ([place])   . Prolonged Q-T interval on ECG 04/19/2019  . Renal calculi   . Tooth loose    top tooth from the right  is loose    Family History  Problem Relation Age of Onset  . Depression Mother   . Diabetes Father   . Heart failure Father    Past Surgical History:  Procedure Laterality Date  . ABDOMINAL AORTOGRAM W/LOWER EXTREMITY N/A 11/14/2017   Procedure: ABDOMINAL AORTOGRAM W/LOWER EXTREMITY;  Surgeon: Waynetta Sandy, MD;  Location: [facility];  Service: Cardiovascular;  Laterality: N/A;  . ANGIOPLASTY ILLIAC ARTERY Left 02/25/2018   Procedure:  BALLOON ANGIOPLASTY LEFT ILIAC ARTERY;  Surgeon: Conrad Pennside, MD;  Location: [place];  Service: Vascular;  Laterality: Left;  . APPLICATION OF WOUND VAC Left 03/03/2018   Procedure: APPLICATION OF WOUND VAC;  Surgeon: Angelia Mould, MD;  Location: [place];  Service: Vascular;  Laterality: Left;  . CYSTOSCOPY W/ URETERAL STENT PLACEMENT Right 06/27/2018   Procedure: CYSTOSCOPY WITH RETROGRADE PYELOGRAM/URETERAL DOUBLE J STENT PLACEMENT;  Surgeon: Ceasar Mons, MD;  Location: [place];  Service: Urology;  Laterality: Right;  . CYSTOSCOPY WITH STENT PLACEMENT N/A 02/23/2019   Procedure: CYSTOSCOPY WITH RIGHT URETERAL STENT EXCHANGE/ LEFT URETERAL STENT PLACEMENT;  Surgeon: Ceasar Mons, MD;  Location: [facility];  Service: Urology;  Laterality: N/A;  . CYSTOSCOPY WITH STENT PLACEMENT Right 06/11/2019   Procedure: CYSTOSCOPY,RIGHT RETROGRADE WITH STENT PLACEMENT;  Surgeon: Irine Seal, MD;  Location: [facility];  Service: Urology;  Laterality: Right;  . CYSTOSCOPY/RETROGRADE/URETEROSCOPY/STONE EXTRACTION WITH BASKET Bilateral 05/01/2019   Procedure: CYSTOSCOPY/URETEROSCOPY/STONE EXTRACTION / LASER LITHOTRIPSY, BILATERAL URETEROSCOPY WITH URETERAL STONE EXTRACTION AND STENT EXCHANGE;  Surgeon: Ceasar Mons, MD;  Location: [facility];  Service: Urology;  Laterality: Bilateral;  . CYSTOSCOPY/URETEROSCOPY/HOLMIUM LASER/STENT PLACEMENT Right 06/27/2019   Procedure: CYSTOSCOPY/URETEROSCOPY/HOLMIUM LASER/STENT EXCHANGE;  Surgeon: Ceasar Mons, MD;  Location: [facility];  Service: Urology;  Laterality: Right;  . EMBOLECTOMY Left 02/24/2017   Procedure: Left Lower Extremity Embolectomy and Angiogram.;  Surgeon: Waynetta Sandy, MD;  Location: [place];  Service: Vascular;  Laterality: Left;  . INSERTION OF ILIAC STENT  02/24/2017   Procedure: INSERTION OF Common ILIAC STENT;  Surgeon: Waynetta Sandy, MD;  Location: [place];  Service: Vascular;;   . INTRAOPERATIVE ARTERIOGRAM Left 02/25/2018   Procedure: INTRA OPERATIVE ARTERIOGRAM WITH LEFT LEG RUNOFF;  Surgeon: Conrad Hemingway, MD;  Location: [place];  Service: Vascular;  Laterality: Left;  . LOWER EXTREMITY ANGIOGRAM Left 02/24/2017   Procedure: Aortagram, Left lower extremity Run-off;  Surgeon: Waynetta Sandy, MD;  Location: [place];  Service: Vascular;  Laterality: Left;  . PATCH ANGIOPLASTY Left 02/25/2018   Procedure: PATCH ANGIOPLASTY LEFT SUPERFICIAL FEMORAL ARTERY WITH BOVINE PATCH;  Surgeon: Conrad Starr, MD;  Location: [place];  Service: Vascular;  Laterality: Left;  . PERCUTANEOUS VENOUS THROMBECTOMY,LYSIS WITH INTRAVASCULAR ULTRASOUND (IVUS) Left 05/12/2018   Procedure: MECHANICAL THROMBECTOMY LEFT LEG, BALLOON ANGIOPLASTY LEFT ANTERIOR TIBIAL ARTERY, AORTOGRAM WITH LEFT LEG RUNOFF;  Surgeon: Serafina Mitchell, MD;  Location: [place];  Service: Vascular;  Laterality: Left;  . 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 THROMBECTOMY FEMORAL ARTERY Left 02/25/2018   Procedure: LEFT ILIAC AND POPLITEAL ARTERY THROMBECTOMY;  Surgeon: Conrad [place], MD;  Location: [place];  Service: Vascular;  Laterality: Left;  . TUBAL LIGATION    . WOUND DEBRIDEMENT Left 03/03/2018   Procedure: DEBRIDEMENT WOUND;  Surgeon: Angelia Mould, MD;  Location: [place];  Service: Vascular;  Laterality: Left;  . WOUND EXPLORATION Left 03/03/2018   Procedure: WOUND EXPLORATION;  Surgeon: Angelia Mould, MD;  Location: [place];  Service: Vascular;  Laterality: Left;    Short Social History:  Social History   Tobacco Use  . Smoking status: Current Some Day Smoker    Packs/day: 0.25    Years: 15.00    Pack years: 3.75    Types: Cigarettes  . Smokeless tobacco: Never Used  Substance Use Topics  . Alcohol use: Yes    Comment: occassionally    Allergies  Allergen Reactions  . Ciprofloxacin Hcl Hives  . Macrobid [Nitrofurantoin Macrocrystal] Hives, Shortness Of Breath and Rash  . Other Hives,  Shortness Of Breath and Rash    NO "-CILLINS"!!!  . Penicillins Shortness Of Breath    Had had cephalosporins without incident Has patient had a PCN reaction causing immediate rash, facial/tongue/throat swelling, SOB or lightheadedness with hypotension: Yes Has patient had a PCN reaction causing severe rash involving mucus membranes or skin necrosis: Unk Has patient had a PCN reaction that required hospitalization: Unk Has patient had a PCN reaction occurring within the last 10 years: No If all of the above answers are "NO", then may proceed with Cephalosporin use.   . Sulfa Antibiotics Hives, Shortness Of Breath and Rash  . Dilaudid [Hydromorphone Hcl] Other (See Comments)    Asystole per pt report  . Lexapro [Escitalopram Oxalate] Other (See Comments)    "I just did not like it."    Current Outpatient Medications  Medication Sig Dispense Refill  . albuterol (PROAIR HFA) 108 (90 Base) MCG/ACT inhaler Inhale 2 puffs into the lungs every 6 (six) hours as needed for wheezing or shortness of breath. 18 g 0  . amLODipine (NORVASC) 5 MG tablet Take 1 tablet (5 mg total) by mouth daily. 30 tablet 3  . aspirin EC 81 MG tablet Take  81 mg by mouth daily.    . blood glucose meter kit and supplies Dispense based on patient and insurance preference. Use up to four times daily as directed. (FOR ICD-9 250.00, 250.01). 1 each 0  . Blood Glucose Monitoring Suppl (TRUE METRIX METER) w/Device KIT Use as directed 1 kit 0  . buPROPion (WELLBUTRIN SR) 150 MG 12 hr tablet Take 1 tablet (150 mg total) by mouth 2 (two) times daily. 60 tablet 3  . ferrous sulfate 325 (65 FE) MG tablet Take 1 tablet (325 mg total) by mouth daily with breakfast. 431 tablet 1  . folic acid (FOLVITE) 1 MG tablet Take 1 tablet (1 mg total) by mouth daily. 30 tablet 2  . gabapentin (NEURONTIN) 400 MG capsule Take 1 capsule (400 mg total) by mouth 3 (three) times daily. 90 capsule 4  . glucose blood (TRUE METRIX BLOOD GLUCOSE TEST) test  strip Use as instructed 100 each 12  . glucose blood test strip Use as instructed 100 each 12  . hydrOXYzine (ATARAX/VISTARIL) 50 MG tablet Take 1 tablet (50 mg total) by mouth 3 (three) times daily as needed for itching or anxiety. 90 tablet 3  . insulin aspart (NOVOLOG) 100 UNIT/ML injection Inject 5-15 Units into the skin 3 (three) times daily before meals.    . insulin glargine (LANTUS) 100 UNIT/ML injection Inject 73 Units into the skin at bedtime.    . Insulin Pen Needle 31G X 5 MM MISC Use with Lantus and Novolog injections. 100 each 11  . Insulin Syringe-Needle U-100 (BD INSULIN SYRINGE ULTRAFINE) 31G X 5/16" 0.5 ML MISC Use as directed 100 each 3  . lip balm (CARMEX) ointment Apply topically as needed for lip care. 7 g 0  . loratadine (CLARITIN) 10 MG tablet Take 1 tablet (10 mg total) by mouth daily as needed for allergies. 30 tablet 0  . menthol-cetylpyridinium (CEPACOL) 3 MG lozenge Take 1 lozenge (3 mg total) by mouth as needed for sore throat. 100 tablet 12  . nicotine (NICODERM CQ - DOSED IN MG/24 HOURS) 14 mg/24hr patch Place 1 patch (14 mg total) onto the skin daily. 28 patch 0  . nitroGLYCERIN (NITROSTAT) 0.4 MG SL tablet Place 1 tablet (0.4 mg total) under the tongue every 5 (five) minutes as needed for chest pain. 50 tablet 3  . omeprazole (PRILOSEC) 20 MG capsule Take 1 capsule (20 mg total) by mouth 2 (two) times daily before a meal. 60 capsule 4  . polyethylene glycol (MIRALAX / GLYCOLAX) 17 g packet Take 17 g by mouth 2 (two) times daily. 14 each 0  . senna-docusate (SENOKOT-S) 8.6-50 MG tablet Take 1 tablet by mouth 2 (two) times daily. 60 tablet 0  . sertraline (ZOLOFT) 50 MG tablet Take 1 tablet (50 mg total) by mouth daily. 1/2 tab daily x 2 wks then 1 tab PO daily 30 tablet 3  . sodium chloride (OCEAN) 0.65 % SOLN nasal spray Place 1 spray into both nostrils as needed for congestion (nose irritation). 30 mL 0  . TRUEPLUS LANCETS 28G MISC Use as directed 100 each 6  .  vitamin B-12 (CYANOCOBALAMIN) 1000 MCG tablet Take 2 tablets (2,000 mcg total) by mouth daily. 60 tablet 3  . warfarin (COUMADIN) 5 MG tablet TAKE 2 TABLETS ON MONDAY, WEDNESDAY AND FRIDAY WITH 1 TABLET ALL OTHER DAYS. (Patient taking differently: Take 5-10 mg by mouth See admin instructions. Take 10 Monday, Wednesday and Friday and 5 mg on Tuesday, Thursday, Saturday and Sunday)  60 tablet 3   No current facility-administered medications for this visit.     Review of Systems  Constitutional:  Constitutional negative. HENT: HENT negative.  Eyes: Eyes negative.  Respiratory: Positive for shortness of breath.  Cardiovascular: Positive for chest pain, leg swelling and palpitations.  GI: Gastrointestinal negative.  Musculoskeletal: Musculoskeletal negative.  Skin: Skin negative.  Neurological: Neurological negative. Hematologic: Hematologic/lymphatic negative.  Psychiatric: Psychiatric negative.        Objective:  Objective   Vitals:   08/17/19 1046  BP: (!) 154/86  Pulse: 93  Resp: 20  Temp: 98 F (36.7 C)  SpO2: 99%  Weight: 273 lb 14.4 oz (124.2 kg)  Height: _0  (1.702 m)   Body mass index is 42.9 kg/m.  Physical Exam HENT:     Head: Normocephalic.     Mouth/Throat:     Mouth: Mucous membranes are moist.  Neck:     Musculoskeletal: Neck supple.  Cardiovascular:     Rate and Rhythm: Normal rate.     Pulses:          Dorsalis pedis pulses are 2+ on the right side.     Comments: Strong dorsalis pedis signal on left lateral foot Pulmonary:     Effort: Pulmonary effort is normal.  Abdominal:     General: Abdomen is flat.     Palpations: Abdomen is soft.  Musculoskeletal: Normal range of motion.  Skin:    General: Skin is warm and dry.     Capillary Refill: Capillary refill takes less than 2 seconds.  Neurological:     General: No focal deficit present.     Mental Status: She is alert.  Psychiatric:        Mood and Affect: Mood normal.        Behavior:  Behavior normal.        Thought Content: Thought content normal.        Judgment: Judgment normal.     Data: I have independently interpreted her ABIs to be right-sided 1.43 and left 1.04 triphasic bilaterally.  I have also independently interpreted her aortoiliac duplex to demonstrate triphasic waveforms throughout the left common iliac artery stent.     Assessment/Plan:     43 year old female with previous left lower extremity thromboembolectomy open as well as stenting, external leg arteries and subsequent percutaneous intervention as well.  Now with normal ABIs does not have any lower extremity complaints at this time.  She remains on Coumadin and aspirin.  I have encouraged smoking cessation she states she is doing her best.  I have also encouraged abstinence from drug use.  She demonstrates good understanding.  We will get her back in 1 year should she not have issues prior     Waynetta Sandy MD [facility], [facility], [facility]

## 2019-08-21 ENCOUNTER — Other Ambulatory Visit: Payer: Self-pay

## 2019-08-21 DIAGNOSIS — I779 Disorder of arteries and arterioles, unspecified: Secondary | ICD-10-CM

## 2019-08-22 ENCOUNTER — Encounter: Payer: Self-pay | Admitting: *Deleted

## 2019-08-23 ENCOUNTER — Ambulatory Visit (INDEPENDENT_AMBULATORY_CARE_PROVIDER_SITE_OTHER): Payer: Medicaid Other | Admitting: Obstetrics and Gynecology

## 2019-08-23 ENCOUNTER — Other Ambulatory Visit (HOSPITAL_COMMUNITY)
Admission: RE | Admit: 2019-08-23 | Discharge: 2019-08-23 | Disposition: A | Discharge status: Home / Self Care | Payer: Medicaid Other | Source: Ambulatory Visit | Attending: Obstetrics and Gynecology | Admitting: Obstetrics and Gynecology

## 2019-08-23 ENCOUNTER — Encounter: Payer: Self-pay | Admitting: Obstetrics and Gynecology

## 2019-08-23 ENCOUNTER — Other Ambulatory Visit: Payer: Self-pay

## 2019-08-23 VITALS — BP 124/66 | HR 87 | Ht 67.0 in | Wt 274.2 lb

## 2019-08-23 DIAGNOSIS — N939 Abnormal uterine and vaginal bleeding, unspecified: Secondary | ICD-10-CM

## 2019-08-23 NOTE — Progress Notes (Signed)
Terri Sparks presents in referral for consideration of hyst d/t to AUB. Pt reports monthly cycles most of the time, cycles last 7-10 days, heavy with cramps and clots. Changes pads q 30 minutes to 1 hr on heaviest days. Tried Megace helped for a while and then stopped helping. Last pap 9/19. Sexual active TSVD x 2 followed by c section, Largest 7# 9oz H/O BTL H/O LLE thromboembolectomy Several medical problems as noted in chart, Type 2 DM, CKD, HTN, Kidney stones, PVD ( on chronic Coumadin)  PE AF VSS Lungs clear Heart RRR Abd soft + BS obese GU nl EGBUS cervix no lesion, uterus small < 10 weeks mobile non tender, no adnexal masses or tenderness  ENDOMETRIAL BIOPSY     The indications for endometrial biopsy were reviewed.   Risks of the biopsy including cramping, bleeding, infection, uterine perforation, inadequate specimen and need for additional procedures  were discussed. The patient states she understands and agrees to undergo procedure today. Consent was signed. Time out was performed.  During the pelvic exam, the cervix was prepped with Betadine. A single-toothed tenaculum was placed on the anterior lip of the cervix to stabilize it. The 3 mm pipelle was introduced into the endometrial cavity without difficulty to a depth of 9cm, and a moderate amount of tissue was obtained and sent to pathology. The instruments were removed from the patient's vagina. Minimal bleeding from the cervix was noted. The patient tolerated the procedure well. Routine post-procedure instructions were given to the patient.    A/P AUB   S/P EMBX today. Will check GYN U/S. Tx options reviewed with pt. Pt declined IUD, desires definite therapy. Will obtain  Pre op clearance. F/U in 4 weeks for [facility] results and U/S results. TVH briefly discussed today.

## 2019-08-23 NOTE — Patient Instructions (Signed)
Vaginal Hysterectomy  A vaginal hysterectomy is a procedure to remove all or part of the uterus through a small incision in the vagina. In this procedure, your health care provider may remove your entire uterus, including the lower end (cervix). You may need a vaginal hysterectomy to treat:  Uterine fibroids.  A condition that causes the lining of the uterus to grow in other areas (endometriosis).  Problems with pelvic support.  Cancer of the cervix, ovaries, uterus, or tissue that lines the uterus (endometrium).  Excessive (dysfunctional) uterine bleeding. When removing your uterus, your health care provider may also remove the organs that produce eggs (ovaries) and the tubes that carry eggs to your uterus (fallopian tubes). After a vaginal hysterectomy, you will no longer be able to have a baby. You will also no longer get your menstrual period. Tell a health care provider about:  Any allergies you have.  All medicines you are taking, including vitamins, herbs, eye drops, creams, and over-the-counter medicines.  Any problems you or family members have had with anesthetic medicines.  Any blood disorders you have.  Any surgeries you have had.  Any medical conditions you have.  Whether you are pregnant or may be pregnant. What are the risks? Generally, this is a safe procedure. However, problems may occur, including:  Bleeding.  Infection.  A blood clot that forms in your leg and travels to your lungs (pulmonary embolism).  Damage to surrounding organs.  Pain during sex. What happens before the procedure?  Ask your health care provider what organs will be removed during surgery.  Ask your health care provider about: ? Changing or stopping your regular medicines. This is especially important if you are taking diabetes medicines or blood thinners. ? Taking medicines such as aspirin and ibuprofen. These medicines can thin your blood. Do not take these medicines before your  procedure if your health care provider instructs you not to.  Follow instructions from your health care provider about eating or drinking restrictions.  Do not use any tobacco products, such as cigarettes, chewing tobacco, and e-cigarettes. If you need help quitting, ask your health care provider.  Plan to have someone take you home after discharge from the hospital. What happens during the procedure?  To reduce your risk of infection: ? Your health care team will wash or sanitize their hands. ? Your skin will be washed with soap.  An IV tube will be inserted into one of your veins.  You may be given antibiotic medicine to help prevent infection.  You will be given one or more of the following: ? A medicine to help you relax (sedative). ? A medicine to numb the area (local anesthetic). ? A medicine to make you fall asleep (general anesthetic). ? A medicine that is injected into an area of your body to numb everything beyond the injection site (regional anesthetic).  Your surgeon will make an incision in your vagina.  Your surgeon will locate and remove all or part of your uterus.  Your ovaries and fallopian tubes may be removed at the same time.  The incision will be closed with stitches (sutures) that dissolve over time. The procedure may vary among health care providers and hospitals. What happens after the procedure?  Your blood pressure, heart rate, breathing rate, and blood oxygen level will be monitored often until the medicines you were given have worn off.  You will be encouraged to get up and walk around after a few hours to help prevent complications.  You may have IV tubes in place for a few days.  You will be given pain medicine as needed.  Do not drive for 24 hours if you were given a sedative. This information is not intended to replace advice given to you by your health care provider. Make sure you discuss any questions you have with your health care provider.  Document Released: 01/12/2016 Document Revised: 05/13/2016 Document Reviewed: 10/05/2015 Elsevier Patient Education  2020 [employer].

## 2019-08-24 MED FILL — ?AMLODIPINE BESYLATE 5 MG T: 5 MG | 30 days supply | Qty: 30 | Fill #1

## 2019-08-24 MED FILL — SERTRALINE HCL 50 MG TABS: 50 | 33 days supply | Qty: 30 | Fill #1

## 2019-08-24 MED FILL — ?FOLIC ACID 1MG TAB: 1 | 30 days supply | Qty: 30 | Fill #1

## 2019-08-24 MED FILL — BUPROPION SR 150 MG TABLET: 150 | 30 days supply | Qty: 60 | Fill #1

## 2019-08-24 MED FILL — hydrOXYzine HCL 50 MG TABS: 50 | 30 days supply | Qty: 90 | Fill #1

## 2019-08-24 MED FILL — ?OMEPRAZOLE 20MG CAP DR: 20 | 30 days supply | Qty: 60 | Fill #1

## 2019-08-24 MED FILL — GABAPENTIN 400 MG CAPSULE: 400 | 30 days supply | Qty: 90 | Fill #1

## 2019-08-25 LAB — SURGICAL PATHOLOGY

## 2019-08-28 ENCOUNTER — Telehealth: Payer: Self-pay

## 2019-08-28 NOTE — Telephone Encounter (Signed)
Contacted pt to schedule a pre-op appointment with thin 2 weeks per pcp. Pt didn't answer lvm asking pt to give a call back to schedule

## 2019-08-28 NOTE — Telephone Encounter (Addendum)
-----   Message from Chancy Milroy, MD sent at 08/28/2019  8:50 AM EST ----- Please let Terri Sparks know that her [facility] was negative  Thanks Legrand Como   Notified pt that her [facility] results are negative and to come to her f/u appt scheduled on 09/21/19 @ 1035 for management.  Pt verbalized understanding.

## 2019-09-04 ENCOUNTER — Encounter: Payer: Self-pay | Admitting: *Deleted

## 2019-09-04 ENCOUNTER — Encounter (HOSPITAL_COMMUNITY): Payer: Self-pay | Attending: Internal Medicine

## 2019-09-11 ENCOUNTER — Telehealth: Payer: Self-pay | Admitting: Family Medicine

## 2019-09-11 ENCOUNTER — Ambulatory Visit: Payer: Medicaid Other | Admitting: Pharmacist

## 2019-09-11 NOTE — Telephone Encounter (Signed)
Attempted to call patient x2 to get her scheduled for her ultrasound before her appointment on 12/18. No answer on both cell and home numbers, voicemail was left on both numbers for patient to give the office a call back to be scheduled for her ultrasound. Patient instructed that her appointment on 12/18 will be canceled if she does not have her ultrasound before then. Office number left.

## 2019-09-12 ENCOUNTER — Telehealth: Payer: Self-pay | Admitting: Family Medicine

## 2019-09-12 NOTE — Telephone Encounter (Signed)
Attempted to contact patient x3 to get her scheduled for an ultrasound that is needed prior to her appointment on 12/18. No answer, left a message on both cell and home numbers. Patient's son was contacted and he gave me another number to contact patient on [phone]). No answer on this number as well and a voicemail was left on this number as well.

## 2019-09-13 ENCOUNTER — Ambulatory Visit: Payer: Self-pay | Admitting: Cardiovascular Disease

## 2019-09-20 ENCOUNTER — Ambulatory Visit: Payer: Medicaid Other | Admitting: Internal Medicine

## 2019-09-21 ENCOUNTER — Ambulatory Visit: Payer: Medicaid Other | Admitting: Obstetrics and Gynecology

## 2019-09-22 ENCOUNTER — Emergency Department (HOSPITAL_COMMUNITY): Payer: Medicaid Other

## 2019-09-22 ENCOUNTER — Other Ambulatory Visit: Payer: Self-pay

## 2019-09-22 ENCOUNTER — Encounter (HOSPITAL_COMMUNITY): Payer: Self-pay

## 2019-09-22 ENCOUNTER — Inpatient Hospital Stay (HOSPITAL_COMMUNITY)
Admission: EM | Admit: 2019-09-22 | Discharge: 2019-09-24 | DRG: 872 | Disposition: A | Discharge status: Home / Self Care | Payer: Medicaid Other | Attending: Family Medicine | Admitting: Family Medicine

## 2019-09-22 DIAGNOSIS — Z885 Allergy status to narcotic agent status: Secondary | ICD-10-CM

## 2019-09-22 DIAGNOSIS — Z880 Allergy status to penicillin: Secondary | ICD-10-CM

## 2019-09-22 DIAGNOSIS — I493 Ventricular premature depolarization: Secondary | ICD-10-CM | POA: Diagnosis present

## 2019-09-22 DIAGNOSIS — R791 Abnormal coagulation profile: Secondary | ICD-10-CM

## 2019-09-22 DIAGNOSIS — Z7982 Long term (current) use of aspirin: Secondary | ICD-10-CM

## 2019-09-22 DIAGNOSIS — A419 Sepsis, unspecified organism: Principal | ICD-10-CM | POA: Diagnosis present

## 2019-09-22 DIAGNOSIS — R000 Tachycardia, unspecified: Secondary | ICD-10-CM

## 2019-09-22 DIAGNOSIS — G8929 Other chronic pain: Secondary | ICD-10-CM | POA: Diagnosis present

## 2019-09-22 DIAGNOSIS — E1165 Type 2 diabetes mellitus with hyperglycemia: Secondary | ICD-10-CM | POA: Diagnosis present

## 2019-09-22 DIAGNOSIS — R112 Nausea with vomiting, unspecified: Secondary | ICD-10-CM | POA: Diagnosis not present

## 2019-09-22 DIAGNOSIS — E119 Type 2 diabetes mellitus without complications: Secondary | ICD-10-CM

## 2019-09-22 DIAGNOSIS — Z86718 Personal history of other venous thrombosis and embolism: Secondary | ICD-10-CM

## 2019-09-22 DIAGNOSIS — Z87440 Personal history of urinary (tract) infections: Secondary | ICD-10-CM

## 2019-09-22 DIAGNOSIS — F1210 Cannabis abuse, uncomplicated: Secondary | ICD-10-CM | POA: Diagnosis present

## 2019-09-22 DIAGNOSIS — Z794 Long term (current) use of insulin: Secondary | ICD-10-CM

## 2019-09-22 DIAGNOSIS — F1910 Other psychoactive substance abuse, uncomplicated: Secondary | ICD-10-CM

## 2019-09-22 DIAGNOSIS — K219 Gastro-esophageal reflux disease without esophagitis: Secondary | ICD-10-CM

## 2019-09-22 DIAGNOSIS — M79662 Pain in left lower leg: Secondary | ICD-10-CM | POA: Diagnosis present

## 2019-09-22 DIAGNOSIS — F329 Major depressive disorder, single episode, unspecified: Secondary | ICD-10-CM | POA: Diagnosis present

## 2019-09-22 DIAGNOSIS — Z8249 Family history of ischemic heart disease and other diseases of the circulatory system: Secondary | ICD-10-CM

## 2019-09-22 DIAGNOSIS — E669 Obesity, unspecified: Secondary | ICD-10-CM | POA: Diagnosis present

## 2019-09-22 DIAGNOSIS — R0789 Other chest pain: Secondary | ICD-10-CM | POA: Diagnosis not present

## 2019-09-22 DIAGNOSIS — I129 Hypertensive chronic kidney disease with stage 1 through stage 4 chronic kidney disease, or unspecified chronic kidney disease: Secondary | ICD-10-CM | POA: Diagnosis present

## 2019-09-22 DIAGNOSIS — I745 Embolism and thrombosis of iliac artery: Secondary | ICD-10-CM | POA: Diagnosis present

## 2019-09-22 DIAGNOSIS — Z833 Family history of diabetes mellitus: Secondary | ICD-10-CM

## 2019-09-22 DIAGNOSIS — N1832 Chronic kidney disease, stage 3b: Secondary | ICD-10-CM | POA: Diagnosis present

## 2019-09-22 DIAGNOSIS — F1110 Opioid abuse, uncomplicated: Secondary | ICD-10-CM | POA: Diagnosis present

## 2019-09-22 DIAGNOSIS — T50916A Underdosing of multiple unspecified drugs, medicaments and biological substances, initial encounter: Secondary | ICD-10-CM | POA: Diagnosis present

## 2019-09-22 DIAGNOSIS — Z20828 Contact with and (suspected) exposure to other viral communicable diseases: Secondary | ICD-10-CM | POA: Diagnosis present

## 2019-09-22 DIAGNOSIS — Z7901 Long term (current) use of anticoagulants: Secondary | ICD-10-CM

## 2019-09-22 DIAGNOSIS — F1410 Cocaine abuse, uncomplicated: Secondary | ICD-10-CM | POA: Diagnosis present

## 2019-09-22 DIAGNOSIS — Z888 Allergy status to other drugs, medicaments and biological substances status: Secondary | ICD-10-CM

## 2019-09-22 DIAGNOSIS — I10 Essential (primary) hypertension: Secondary | ICD-10-CM

## 2019-09-22 DIAGNOSIS — F17210 Nicotine dependence, cigarettes, uncomplicated: Secondary | ICD-10-CM | POA: Diagnosis present

## 2019-09-22 DIAGNOSIS — E1151 Type 2 diabetes mellitus with diabetic peripheral angiopathy without gangrene: Secondary | ICD-10-CM | POA: Diagnosis present

## 2019-09-22 DIAGNOSIS — Z882 Allergy status to sulfonamides status: Secondary | ICD-10-CM

## 2019-09-22 DIAGNOSIS — N390 Urinary tract infection, site not specified: Secondary | ICD-10-CM

## 2019-09-22 DIAGNOSIS — D509 Iron deficiency anemia, unspecified: Secondary | ICD-10-CM | POA: Diagnosis present

## 2019-09-22 DIAGNOSIS — K2970 Gastritis, unspecified, without bleeding: Secondary | ICD-10-CM | POA: Diagnosis present

## 2019-09-22 DIAGNOSIS — F17200 Nicotine dependence, unspecified, uncomplicated: Secondary | ICD-10-CM

## 2019-09-22 DIAGNOSIS — Z91120 Patient's intentional underdosing of medication regimen due to financial hardship: Secondary | ICD-10-CM

## 2019-09-22 DIAGNOSIS — E213 Hyperparathyroidism, unspecified: Secondary | ICD-10-CM | POA: Diagnosis present

## 2019-09-22 DIAGNOSIS — Z87442 Personal history of urinary calculi: Secondary | ICD-10-CM

## 2019-09-22 DIAGNOSIS — F419 Anxiety disorder, unspecified: Secondary | ICD-10-CM

## 2019-09-22 DIAGNOSIS — E1122 Type 2 diabetes mellitus with diabetic chronic kidney disease: Secondary | ICD-10-CM | POA: Diagnosis present

## 2019-09-22 DIAGNOSIS — R079 Chest pain, unspecified: Secondary | ICD-10-CM

## 2019-09-22 DIAGNOSIS — I088 Other rheumatic multiple valve diseases: Secondary | ICD-10-CM | POA: Diagnosis present

## 2019-09-22 DIAGNOSIS — Z881 Allergy status to other antibiotic agents status: Secondary | ICD-10-CM

## 2019-09-22 DIAGNOSIS — Z6839 Body mass index (BMI) 39.0-39.9, adult: Secondary | ICD-10-CM

## 2019-09-22 LAB — URINALYSIS, ROUTINE W REFLEX MICROSCOPIC
Bilirubin Urine: NEGATIVE
Glucose, UA: 150 mg/dL — AB
Ketones, ur: 20 mg/dL — AB
Nitrite: NEGATIVE
Protein, ur: 100 mg/dL — AB
Specific Gravity, Urine: 1.011 (ref 1.005–1.030)
WBC, UA: >50 WBC/hpf — ABNORMAL HIGH (ref 0–5)
pH: 6 (ref 5.0–8.0)

## 2019-09-22 LAB — CBC
HCT: 50.4 % — ABNORMAL HIGH (ref 36.0–46.0)
Hemoglobin: 16.2 g/dL — ABNORMAL HIGH (ref 12.0–15.0)
MCH: 29.9 pg (ref 26.0–34.0)
MCHC: 32.1 g/dL (ref 30.0–36.0)
MCV: 93 fL (ref 80.0–100.0)
Platelets: 408 10*3/uL — ABNORMAL HIGH (ref 150–400)
RBC: 5.42 MIL/uL — ABNORMAL HIGH (ref 3.87–5.11)
RDW: 13.2 % (ref 11.5–15.5)
WBC: 22.5 10*3/uL — ABNORMAL HIGH (ref 4.0–10.5)
nRBC: 0 % (ref 0.0–0.2)

## 2019-09-22 LAB — RAPID URINE DRUG SCREEN, HOSP PERFORMED
Amphetamines: NOT DETECTED
Barbiturates: NOT DETECTED
Benzodiazepines: NOT DETECTED
Cocaine: POSITIVE — AB
Opiates: POSITIVE — AB
Tetrahydrocannabinol: POSITIVE — AB

## 2019-09-22 LAB — HEPATIC FUNCTION PANEL
ALT: 15 U/L (ref 0–44)
AST: 14 U/L — ABNORMAL LOW (ref 15–41)
Albumin: 4.2 g/dL (ref 3.5–5.0)
Alkaline Phosphatase: 93 U/L (ref 38–126)
Bilirubin, Direct: <0.1 mg/dL (ref 0.0–0.2)
Total Bilirubin: 0.6 mg/dL (ref 0.3–1.2)
Total Protein: 8.2 g/dL — ABNORMAL HIGH (ref 6.5–8.1)

## 2019-09-22 LAB — BASIC METABOLIC PANEL
Anion gap: 14 (ref 5–15)
BUN: 14 mg/dL (ref 6–20)
CO2: 25 mmol/L (ref 22–32)
Calcium: 10.2 mg/dL (ref 8.9–10.3)
Chloride: 94 mmol/L — ABNORMAL LOW (ref 98–111)
Creatinine, Ser: 1.84 mg/dL — ABNORMAL HIGH (ref 0.44–1.00)
GFR calc Af Amer: 38 mL/min — ABNORMAL LOW (ref >60)
GFR calc non Af Amer: 33 mL/min — ABNORMAL LOW (ref >60)
Glucose, Bld: 256 mg/dL — ABNORMAL HIGH (ref 70–99)
Potassium: 3.7 mmol/L (ref 3.5–5.1)
Sodium: 133 mmol/L — ABNORMAL LOW (ref 135–145)

## 2019-09-22 LAB — TROPONIN I (HIGH SENSITIVITY)
Troponin I (High Sensitivity): 8 ng/L (ref <18)
Troponin I (High Sensitivity): 10 ng/L (ref <18)

## 2019-09-22 LAB — I-STAT BETA HCG BLOOD, ED (MC, WL, AP ONLY): I-stat hCG, quantitative: <5 m[IU]/mL (ref <5)

## 2019-09-22 LAB — POC OCCULT BLOOD, ED: Fecal Occult Bld: NEGATIVE

## 2019-09-22 LAB — PROTIME-INR
INR: 0.9 (ref 0.8–1.2)
Prothrombin Time: 12.3 seconds (ref 11.4–15.2)

## 2019-09-22 MED ORDER — LIDOCAINE VISCOUS HCL 2 % MT SOLN
15.0000 mL | Freq: Once | OROMUCOSAL | Status: AC
  Administered 2019-09-22: 15 mL via ORAL
  Filled 2019-09-22: qty 15

## 2019-09-22 MED ORDER — SODIUM CHLORIDE 0.9 % IV SOLN
1.0000 g | Freq: Once | INTRAVENOUS | Status: AC
  Administered 2019-09-22: 1 g via INTRAVENOUS
  Filled 2019-09-22: qty 10

## 2019-09-22 MED ORDER — MORPHINE SULFATE (PF) 4 MG/ML IV SOLN
6.0000 mg | Freq: Once | INTRAVENOUS | Status: AC
  Administered 2019-09-22: 6 mg via INTRAVENOUS
  Filled 2019-09-22: qty 2

## 2019-09-22 MED ORDER — SODIUM CHLORIDE 0.9 % IV BOLUS
1000.0000 mL | Freq: Once | INTRAVENOUS | Status: AC
  Administered 2019-09-22: 1000 mL via INTRAVENOUS

## 2019-09-22 MED ORDER — ALUM & MAG HYDROXIDE-SIMETH 200-200-20 MG/5ML PO SUSP
30.0000 mL | Freq: Once | ORAL | Status: AC
  Administered 2019-09-22: 30 mL via ORAL
  Filled 2019-09-22: qty 30

## 2019-09-22 MED ORDER — PROMETHAZINE HCL 25 MG/ML IJ SOLN
12.5000 mg | Freq: Once | INTRAMUSCULAR | Status: AC
  Administered 2019-09-22: 12.5 mg via INTRAVENOUS
  Filled 2019-09-22: qty 1

## 2019-09-22 NOTE — ED Triage Notes (Signed)
Pt reports chest pain since last night along with abd pain and emesis for the past 3 days. Pt states last night her vomit was a redish-orange color and today her vomit was black. Pt a.o, nad noted.

## 2019-09-22 NOTE — ED Notes (Signed)
Notified Nuclear med tech in regards to V/Q scan.

## 2019-09-22 NOTE — ED Notes (Signed)
Patient transported to Ultrasound 

## 2019-09-22 NOTE — ED Notes (Signed)
Rapid rate noted on monitor, increased from 94 to 120bpm , updated EKG captured and provided to EDP. Pt now reporting CP and palpitations, states "now that my heart burn was helped, I can distinguish that this is CP."

## 2019-09-22 NOTE — ED Notes (Signed)
Pt also reports she has been out of all her medications for 2 months due to financial issues

## 2019-09-22 NOTE — ED Provider Notes (Signed)
[facility] EMERGENCY DEPARTMENT Provider Note   CSN: [id number] Arrival date & time: 09/22/19  1535     History Chief Complaint  Patient presents with  . Chest Pain  . Abdominal Pain  . Emesis    Terri Sparks is a 43 y.o. female.  The history is provided by the patient and medical records. No language interpreter was used.       43 year old female with significant history of GERD, diabetes, anxiety, thromboembolism, currently on warfarin, substance abuse presented to ED for evaluation of abdominal pain chest pain.  Patient reports gradual onset of pain to her epigastric region that radiates towards her mid chest ongoing for the past 3 days.  She described pain as a burning sensation with associated vomiting initially of orange content but now black content.  Pain is moderate in severity and also radiates to her back.  No associated fever, chills, shortness of breath, productive cough, URI symptoms, loss of taste or smell, dysuria.  States she has not had a bowel movement in the past few days because she have not been able to keep anything down.  Able to pass flatus.  No recent sick contact with anyone with COVID-19.  She does admits to tobacco use as well as daily marijuana use.  She denies alcohol abuse.  She does admits to taking ibuprofen and aspirin on occasion.  Past Medical History:  Diagnosis Date  . Anxiety   . Depression   . Diabetes ([place])    Type 2  . GERD (gastroesophageal reflux disease)   . History of blood clots    ,DVT-left leg, and early 2000, had blood clot behind left breast  . History of kidney stones   . Hypercalcemia   . Hyperparathyroidism   . Hypertension    had been on Lisinopril and PCP took her off it 4 months ago  . Iron deficiency anemia   . Left thyroid nodule   . Mass of right ovary   . Nephrolithiasis   . 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 Peripheral vascular disease ([place])   . Prolonged Q-T interval  on ECG 04/19/2019  . Renal calculi   . Substance abuse ([place])   . Tooth loose    top tooth from the right  is loose     Patient Active Problem List   Diagnosis Date Noted  . Stage 3b chronic kidney disease 07/30/2019  . Iron deficiency anemia due to chronic blood loss 06/12/2019  . Vitamin B12 deficiency 06/12/2019  . Folate deficiency 06/12/2019  . Constipation 06/12/2019  . Prolonged QT interval   . Ureteral stone with hydronephrosis 06/11/2019  . Acute renal failure superimposed on stage 3 chronic kidney disease ([place]) 06/26/2018  . Right ureteral stone 06/26/2018  . Essential hypertension 06/26/2018  . Adrenal nodule ([place]) 06/21/2018  . Lung nodules 06/21/2018  . Abnormal uterine bleeding (AUB) 05/14/2018  . Hemorrhagic cyst of right ovary 05/14/2018  . Ischemia of extremity 05/12/2018  . Former smoker 03/14/2018  . 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 Thromboembolism ([place]) 02/25/2018  . Microalbuminuria 09/29/2017  . Insomnia 09/29/2017  . Immunization due 08/01/2017  . Anxiety and depression 04/28/2017  . 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 Iliac artery occlusion, left ([place]) 02/25/2017  . Tobacco abuse 02/25/2017  . Diabetes mellitus, type II ([place]) 02/25/2017  . Peripheral vascular disease of lower extremity ([place]) 02/24/2017  . GERD (gastroesophageal reflux disease)   .  Hyperparathyroidism     Past Surgical History:  Procedure Laterality Date  . ABDOMINAL AORTOGRAM W/LOWER EXTREMITY N/A 11/14/2017   Procedure: ABDOMINAL AORTOGRAM W/LOWER EXTREMITY;  Surgeon: Waynetta Sandy, MD;  Location: [facility];  Service: Cardiovascular;  Laterality: N/A;  . ANGIOPLASTY ILLIAC ARTERY Left 02/25/2018   Procedure: BALLOON ANGIOPLASTY LEFT ILIAC ARTERY;  Surgeon: Conrad [employer], MD;  Location: [facility];  Service: Vascular;  Laterality: Left;  . APPLICATION OF WOUND VAC Left 03/03/2018   Procedure: APPLICATION OF WOUND VAC;  Surgeon: Angelia Mould, MD;  Location: [facility];  Service: Vascular;  Laterality: Left;  . CYSTOSCOPY W/ URETERAL STENT PLACEMENT Right 06/27/2018   Procedure: CYSTOSCOPY WITH RETROGRADE PYELOGRAM/URETERAL DOUBLE J STENT PLACEMENT;  Surgeon: Ceasar Mons, MD;  Location: [facility];  Service: Urology;  Laterality: Right;  . CYSTOSCOPY WITH STENT PLACEMENT N/A 02/23/2019   Procedure: CYSTOSCOPY WITH RIGHT URETERAL STENT EXCHANGE/ LEFT URETERAL STENT PLACEMENT;  Surgeon: Ceasar Mons, MD;  [facility];  Service: Urology;  Laterality: N/A;  . CYSTOSCOPY WITH STENT PLACEMENT Right 06/11/2019   Procedure: CYSTOSCOPY,RIGHT RETROGRADE WITH STENT PLACEMENT;  Surgeon: Irine Seal, MD;  [facility];  Service: Urology;  Laterality: Right;  . CYSTOSCOPY/RETROGRADE/URETEROSCOPY/STONE EXTRACTION WITH BASKET Bilateral 05/01/2019   Procedure: CYSTOSCOPY/URETEROSCOPY/STONE EXTRACTION / LASER LITHOTRIPSY, BILATERAL URETEROSCOPY WITH URETERAL STONE EXTRACTION AND STENT EXCHANGE;  Surgeon: Ceasar Mons, MD;  Location: [facility];  Service: Urology;  Laterality: Bilateral;  . CYSTOSCOPY/URETEROSCOPY/HOLMIUM LASER/STENT PLACEMENT Right 06/27/2019   Procedure: CYSTOSCOPY/URETEROSCOPY/HOLMIUM LASER/STENT EXCHANGE;  Surgeon: Ceasar Mons, MD;  [facility];  Service: Urology;  Laterality: Right;  . EMBOLECTOMY Left 02/24/2017   Procedure: Left Lower Extremity Embolectomy and Angiogram.;  Surgeon: Waynetta Sandy, MD;  Location: [facility];  Service: Vascular;  Laterality: Left;  . INSERTION OF ILIAC STENT  02/24/2017   Procedure: INSERTION OF Common ILIAC STENT;  Surgeon: Waynetta Sandy, MD;  Location: [facility];  Service: Vascular;;  . INTRAOPERATIVE ARTERIOGRAM Left 02/25/2018   Procedure: INTRA OPERATIVE ARTERIOGRAM WITH LEFT LEG RUNOFF;  Surgeon: Conrad Samoa, MD;  Location: [facility];  Service: Vascular;  Laterality: Left;  . LOWER EXTREMITY ANGIOGRAM Left  02/24/2017   Procedure: Aortagram, Left lower extremity Run-off;  Surgeon: Waynetta Sandy, MD;  Location: [facility];  Service: Vascular;  Laterality: Left;  . PATCH ANGIOPLASTY Left 02/25/2018   Procedure: PATCH ANGIOPLASTY LEFT SUPERFICIAL FEMORAL ARTERY WITH BOVINE PATCH;  Surgeon: Conrad [place], MD;  Location: [facility];  Service: Vascular;  Laterality: Left;  . PERCUTANEOUS VENOUS THROMBECTOMY,LYSIS WITH INTRAVASCULAR ULTRASOUND (IVUS) Left 05/12/2018   Procedure: MECHANICAL THROMBECTOMY LEFT LEG, BALLOON ANGIOPLASTY LEFT ANTERIOR TIBIAL ARTERY, AORTOGRAM WITH LEFT LEG RUNOFF;  Surgeon: Serafina Mitchell, MD;  Location: [facility];  Service: Vascular;  Laterality: Left;  . 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 THROMBECTOMY FEMORAL ARTERY Left 02/25/2018   Procedure: LEFT ILIAC AND POPLITEAL ARTERY THROMBECTOMY;  Surgeon: Conrad , MD;  Location: [facility];  Service: Vascular;  Laterality: Left;  . TUBAL LIGATION    . WOUND DEBRIDEMENT Left 03/03/2018   Procedure: DEBRIDEMENT WOUND;  Surgeon: Angelia Mould, MD;  Location: [facility];  Service: Vascular;  Laterality: Left;  . WOUND EXPLORATION Left 03/03/2018   Procedure: WOUND EXPLORATION;  Surgeon: Angelia Mould, MD;  Location: [facility];  Service: Vascular;  Laterality: Left;     OB History   No obstetric history on file.  Family History  Problem Relation Age of Onset  . Depression Mother   . Diabetes Father   . Heart failure Father     Social History   Tobacco Use  . Smoking status: Current Some Day Smoker    Packs/day: 0.25    Years: 15.00    Pack years: 3.75    Types: Cigarettes  . Smokeless tobacco: Never Used  Substance Use Topics  . Alcohol use: Yes    Comment: occassionally  . Drug use: Yes    Types: Marijuana, Cocaine    Comment: once daily- last time used was night of 02/21/2019    Home Medications Prior to Admission medications   Medication Sig Start Date End Date Taking? Authorizing Provider    albuterol (PROAIR HFA) 108 (90 Base) MCG/ACT inhaler Inhale 2 puffs into the lungs every 6 (six) hours as needed for wheezing or shortness of breath. 12/18/18   Ladell Pier, MD  amLODipine (NORVASC) 5 MG tablet Take 1 tablet (5 mg total) by mouth daily. 07/30/19   Ladell Pier, MD  aspirin EC 81 MG tablet Take 81 mg by mouth daily.    [provider]  blood glucose meter kit and supplies Dispense based on patient and insurance preference. Use up to four times daily as directed. (FOR ICD-9 250.00, 250.01). 03/03/17   Cristal Ford, DO  Blood Glucose Monitoring Suppl (TRUE METRIX METER) w/Device KIT Use as directed 08/01/17   Ladell Pier, MD  buPROPion [facility] SR) 150 MG 12 hr tablet Take 1 tablet (150 mg total) by mouth 2 (two) times daily. 07/30/19   Ladell Pier, MD  ferrous sulfate 325 (65 FE) MG tablet Take 1 tablet (325 mg total) by mouth daily with breakfast. 01/31/18   Ladell Pier, MD  folic acid (FOLVITE) 1 MG tablet Take 1 tablet (1 mg total) by mouth daily. 07/30/19   Ladell Pier, MD  gabapentin (NEURONTIN) 400 MG capsule Take 1 capsule (400 mg total) by mouth 3 (three) times daily. 07/30/19   Ladell Pier, MD  glucose blood (TRUE METRIX BLOOD GLUCOSE TEST) test strip Use as instructed 07/30/19   Ladell Pier, MD  glucose blood test strip Use as instructed 10/23/18   Ladell Pier, MD  hydrOXYzine (ATARAX/VISTARIL) 50 MG tablet Take 1 tablet (50 mg total) by mouth 3 (three) times daily as needed for itching or anxiety. 07/30/19   Ladell Pier, MD  insulin aspart (NOVOLOG) 100 UNIT/ML injection Inject 5-15 Units into the skin 3 (three) times daily before meals.    [provider]  insulin glargine (LANTUS) 100 UNIT/ML injection Inject 73 Units into the skin at bedtime.    [provider]  Insulin Pen Needle 31G X 5 MM MISC Use with Lantus and Novolog injections. 04/19/19   Ladell Pier, MD   Insulin Syringe-Needle U-100 (BD INSULIN SYRINGE ULTRAFINE) 31G X 5/16" 0.5 ML MISC Use as directed 04/28/17   Ladell Pier, MD  lip balm (CARMEX) ointment Apply topically as needed for lip care. 06/16/19   Jani Gravel, MD  loratadine (CLARITIN) 10 MG tablet Take 1 tablet (10 mg total) by mouth daily as needed for allergies. 06/16/19   Jani Gravel, MD  menthol-cetylpyridinium (CEPACOL) 3 MG lozenge Take 1 lozenge (3 mg total) by mouth as needed for sore throat. 06/16/19   Jani Gravel, MD  nicotine (NICODERM CQ - DOSED IN MG/24 HOURS) 14 mg/24hr patch Place 1 patch (14 mg  total) onto the skin daily. Patient not taking: Reported on 08/23/2019 06/16/19   Jani Gravel, MD  nitroGLYCERIN (NITROSTAT) 0.4 MG SL tablet Place 1 tablet (0.4 mg total) under the tongue every 5 (five) minutes as needed for chest pain. 08/16/19   Ladell Pier, MD  omeprazole (PRILOSEC) 20 MG capsule Take 1 capsule (20 mg total) by mouth 2 (two) times daily before a meal. 07/30/19   Ladell Pier, MD  polyethylene glycol (MIRALAX / GLYCOLAX) 17 g packet Take 17 g by mouth 2 (two) times daily. 06/16/19   Jani Gravel, MD  senna-docusate (SENOKOT-S) 8.6-50 MG tablet Take 1 tablet by mouth 2 (two) times daily. 06/16/19   Jani Gravel, MD  sertraline (ZOLOFT) 50 MG tablet Take 1 tablet (50 mg total) by mouth daily. 1/2 tab daily x 2 wks then 1 tab PO daily 07/30/19   Ladell Pier, MD  sodium chloride (OCEAN) 0.65 % SOLN nasal spray Place 1 spray into both nostrils as needed for congestion (nose irritation). 06/16/19   Jani Gravel, MD  TRUEPLUS LANCETS 28G MISC Use as directed 08/01/17   Ladell Pier, MD  vitamin B-12 (CYANOCOBALAMIN) 1000 MCG tablet Take 2 tablets (2,000 mcg total) by mouth daily. 07/30/19   Ladell Pier, MD  warfarin (COUMADIN) 5 MG tablet TAKE 2 TABLETS ON MONDAY, WEDNESDAY AND FRIDAY WITH 1 TABLET ALL OTHER DAYS. Patient taking differently: Take 5-10 mg by mouth See admin instructions. Take 10  Monday, Wednesday and Friday and 5 mg on Tuesday, Thursday, Saturday and Sunday 04/19/19   Ladell Pier, MD    Allergies    Ciprofloxacin hcl, Macrobid [nitrofurantoin macrocrystal], Other, Penicillins, Sulfa antibiotics, Dilaudid [hydromorphone hcl], and Lexapro [escitalopram oxalate]  Review of Systems   Review of Systems  All other systems reviewed and are negative.   Physical Exam Updated Vital Signs BP (!) 148/109   Pulse (!) 108   Temp 98.7 F (37.1 C) (Oral)   Resp 20   Ht 5' 7"  (1.702 m)   Wt 114.3 kg   SpO2 100%   BMI 39.47 kg/m   Physical Exam Vitals and nursing note reviewed.  Constitutional:      General: She is not in acute distress.    Appearance: She is well-developed. She is obese.  HENT:     Head: Atraumatic.  Eyes:     Conjunctiva/sclera: Conjunctivae normal.  Cardiovascular:     Rate and Rhythm: Normal rate and regular rhythm.     Heart sounds: Normal heart sounds.  Pulmonary:     Effort: Pulmonary effort is normal.     Breath sounds: Normal breath sounds.  Abdominal:     Palpations: Abdomen is soft.     Tenderness: There is abdominal tenderness (diffused tenderness, most significant to epigastric region.  no guarding or rebound tenderness).  Musculoskeletal:     Cervical back: Neck supple.  Skin:    Findings: No rash.  Neurological:     Mental Status: She is alert.     ED Results / Procedures / Treatments   Labs (all labs ordered are listed, but only abnormal results are displayed) Labs Reviewed  BASIC METABOLIC PANEL - Abnormal; Notable for the following components:      Result Value   Sodium 133 (*)    Chloride 94 (*)    Glucose, Bld 256 (*)    Creatinine, Ser 1.84 (*)    GFR calc non Af Amer 33 (*)    GFR calc  Af Amer 38 (*)    All other components within normal limits  CBC - Abnormal; Notable for the following components:   WBC 22.5 (*)    RBC 5.42 (*)    Hemoglobin 16.2 (*)    HCT 50.4 (*)    Platelets 408 (*)    All  other components within normal limits  HEPATIC FUNCTION PANEL - Abnormal; Notable for the following components:   Total Protein 8.2 (*)    AST 14 (*)    All other components within normal limits  RAPID URINE DRUG SCREEN, HOSP PERFORMED - Abnormal; Notable for the following components:   Opiates POSITIVE (*)    Cocaine POSITIVE (*)    Tetrahydrocannabinol POSITIVE (*)    All other components within normal limits  URINALYSIS, ROUTINE W REFLEX MICROSCOPIC - Abnormal; Notable for the following components:   APPearance HAZY (*)    Glucose, UA 150 (*)    Hgb urine dipstick MODERATE (*)    Ketones, ur 20 (*)    Protein, ur 100 (*)    Leukocytes,Ua LARGE (*)    WBC, UA >50 (*)    Bacteria, UA MANY (*)    All other components within normal limits  PROTIME-INR  I-STAT BETA HCG BLOOD, ED ([facility], [facility], [facility] ONLY)  POC OCCULT BLOOD, ED  TROPONIN I (HIGH SENSITIVITY)  TROPONIN I (HIGH SENSITIVITY)    EKG EKG Interpretation  Date/Time:  Saturday September 22 2019 17:16:39 EST Ventricular Rate:  105 PR Interval:    QRS Duration: 87 QT Interval:  377 QTC Calculation: 499 R Axis:   16 Text Interpretation: Sinus tachycardia Atrial premature complex Low voltage, precordial leads Borderline prolonged QT interval Confirmed by Lacretia Leigh ([id number]) on 09/22/2019 9:27:26 PM   Radiology CT ABDOMEN PELVIS WO CONTRAST  Result Date: 09/22/2019 CLINICAL DATA:  Chest pain. Abdominal pain. EXAM: CT ABDOMEN AND PELVIS WITHOUT CONTRAST TECHNIQUE: Multidetector CT imaging of the abdomen and pelvis was performed following the standard protocol without IV contrast. COMPARISON:  06/11/2019 FINDINGS: Lower chest: The lung bases are clear. The heart size is normal. Hepatobiliary: The liver is normal. The gallbladder is distended without definite evidence for acute cholecystitis.There is no biliary ductal dilation. Pancreas: Normal contours without ductal dilatation. No peripancreatic fluid collection. Spleen: No  splenic laceration or hematoma. Adrenals/Urinary Tract: --Adrenal glands: There is a stable left adrenal adenoma. --Right kidney/ureter: There are multiple nonobstructing stones in the lower pole the right kidney. There is minimal right-sided collecting system dilatation without evidence for an obstructing stone. The previously noted right ureteral stone has resolved. --Left kidney/ureter: The left kidney is somewhat atrophic in comparison to the right. Multiple nonobstructing stones are noted. There are several calcifications in the cortex. Pockets of gas are again noted and are relatively similar to prior study. There is no hydronephrosis. --Urinary bladder: Small amount of gas is noted within the nondependent portion of the urinary bladder. Stomach/Bowel: --Stomach/Duodenum: No hiatal hernia or other gastric abnormality. Normal duodenal course and caliber. --Small bowel: No dilatation or inflammation. --Colon: No focal abnormality. --Appendix: Normal. Vascular/Lymphatic: Atherosclerotic calcification is present within the non-aneurysmal abdominal aorta, without hemodynamically significant stenosis. A left common iliac artery stent noted. --No retroperitoneal lymphadenopathy. --No mesenteric lymphadenopathy. --No pelvic or inguinal lymphadenopathy. Reproductive: Unremarkable Other: No ascites or free air. There are postsurgical changes of the left inguinal region. Musculoskeletal. No acute displaced fractures. IMPRESSION: 1. No CT evidence for acute intra-abdominal or pelvic pathology. 2. Interval resolution of the previously noted right ureteral  stone. 3. Bilateral nonobstructive nephrolithiasis. 4. Small amount of gas within the nondependent portion of the urinary bladder. Correlate with recent instrumentation. Again noted is gas within the left kidney, similar to prior study. This too is favored to be secondary to prior instrumentation. A gas-forming organism is not entirely excluded and should be correlated  with urinalysis. 5. Distended mild distention of the gallbladder without CT evidence for acute cholecystitis. Aortic Atherosclerosis (ICD10-I70.0). Electronically Signed   By: Constance Holster M.D.   On: 09/22/2019 19:22   DG Chest 2 View  Result Date: 09/22/2019 CLINICAL DATA:  Chest pain beginning yesterday. Vomiting. Hematemesis. EXAM: CHEST - 2 VIEW COMPARISON:  06/26/2018 FINDINGS: The heart size and mediastinal contours are within normal limits. Both lungs are clear. The visualized skeletal structures are unremarkable. IMPRESSION: Negative.  No active cardiopulmonary disease. Electronically Signed   By: Marlaine Hind M.D.   On: 09/22/2019 17:32   US Abdomen Limited  Result Date: 09/22/2019 CLINICAL DATA:  Right upper quadrant pain for 3 days. EXAM: ULTRASOUND ABDOMEN LIMITED RIGHT UPPER QUADRANT COMPARISON:  None. FINDINGS: Technically difficult exam due to patient habitus. Gallbladder: No gallstones or wall thickening visualized. No sonographic Murphy sign noted by sonographer. Common bile duct: Diameter: 3 mm, within normal limits. Liver: No focal lesion identified. Within normal limits in parenchymal echogenicity. Portal vein is patent on color Doppler imaging with normal direction of blood flow towards the liver. Other: None. IMPRESSION: Negative.  No hepatobiliary abnormality identified. Electronically Signed   By: Marlaine Hind M.D.   On: 09/22/2019 17:09    Procedures Procedures (including critical care time)  Medications Ordered in ED Medications  promethazine (PHENERGAN) injection 12.5 mg (12.5 mg Intravenous Given 09/22/19 1620)  sodium chloride 0.9 % bolus 1,000 mL (0 mLs Intravenous Stopped 09/22/19 2135)  alum & mag hydroxide-simeth (MAALOX/MYLANTA) 200-200-20 MG/5ML suspension 30 mL (30 mLs Oral Given 09/22/19 1623)    And  lidocaine (XYLOCAINE) 2 % viscous mouth solution 15 mL (15 mLs Oral Given 09/22/19 1623)  morphine 4 MG/ML injection 6 mg (6 mg Intravenous Given  09/22/19 1631)    ED Course  I have reviewed the triage vital signs and the nursing notes.  Pertinent labs & imaging results that were available during my care of the patient were reviewed by me and considered in my medical decision making (see chart for details).    MDM Rules/Calculators/A&P                      BP (!) 147/90   Pulse 94   Temp 98.7 F (37.1 C) (Oral)   Resp 20   Ht 5' 7"  (1.702 m)   Wt 114.3 kg   SpO2 99%   BMI 39.47 kg/m   Final Clinical Impression(s) / ED Diagnoses Final diagnoses:  Atypical chest pain  Non-intractable vomiting with nausea, unspecified vomiting type  Tachycardia  Lower urinary tract infectious disease    Rx / DC Orders ED Discharge Orders    None     5:22 PM Patient here with abdominal pain, nausea or vomiting and has not had a bowel movement in the past few days.  Admits to marijuana use.  She has upper abdominal discomfort.  X-ray of her chest as well as limited abdominal ultrasound without any acute finding.Discomfort  Patient still endorse in the setting of elevated white count, will be prudent to obtain CT scan of her abdomen pelvis.  Fortunately IV contrast dye is not indicated due  to impaired renal function.  IV fluid given, non-IV contrast of abdomen pelvis ordered.  Patient has history of prolonged QT in the past, her current QTC is 472.  Will be judicious in providing medication that can potentially prolong QT.  She does use marijuana on a regular basis, her symptom may be due to cannabinoid hyperemesis syndrome.  11:06 PM Notable events imaging was performed today including chest x-ray and limited abdominal ultrasound without any acute finding.  Abdominal pelvic CT scan without acute abnormality, however sall amount of gas were noted in the bladder, which may suggest an infection.  UA obtained showing  Large leukocyte, >50 WBC and many bacteria.  Pt denies dysuria, however will prescribe abx to treat for potential UTI.  Pt  otherwise felt better with treatment and stable for discharge.  UDS showing THC, cocaine and opiate.  Recommend avoid drug use as it may contribute to her sxs.   11:17 PM Prior to discharge, patient was noted to be tachycardic with heart rate at 125 at rest.  When asked if she has any shortness of breath she endorsed a mild shortness of breath.  She does not have any evidence of hypoxia but however she would need to be evaluated further for her tachycardia to rule out PE.  Due to impaired kidney function she would benefit from a VQ scan.  11:54 PM Patient signed out to oncoming provider who will follow up on VQ scan results and determine disposition.  Patient will receive additional IV fluid as well as Rocephin to treat for UTI and tachycardia.   Domenic Moras, PA-C 09/22/19 2355    Lacretia Leigh, MD 09/23/19 [phone]

## 2019-09-22 NOTE — ED Notes (Signed)
Pt ambulated to restroom with assistance.  

## 2019-09-22 NOTE — ED Provider Notes (Signed)
Upper abdominal pain, n, v [name] negative CT has nothing acute - ?UTI After treatment, then c/o chest pain, SOB Now tachycardic - getting 2nd liter fluid  Pending VQ scan (CXR neg)  Plan: reassess after VQ scan and fluids  12:45 - patient has had her 2nd liter and heart rate remains 120 while sleeping. No oxygen requirement, O2 sat 97%. VQ scan pending still, waiting for on-call team to arrive shortly.   2:00 - patient back from VQ scan. Complains of increased pain in chest. Pain medication ordered.   VQ scan negative for PE. Trop x 2 negative. EKG non-ischemic. Cause of chest pain is not idenified.  She remains tachycardic despite 3 liters fluid. With persistent tachycardia, leukocytosis, recurrent UTI, feel the patient will need admission for further management. Cultures, lactic acid added to orders. Patient updated on plan and agrees with admission.   Hospitalist paged for admission.   Charlann Lange, PA-C 09/23/19 0319    Lacretia Leigh, MD 09/23/19 [phone]

## 2019-09-23 ENCOUNTER — Encounter (HOSPITAL_COMMUNITY): Payer: Self-pay | Admitting: Family Medicine

## 2019-09-23 ENCOUNTER — Inpatient Hospital Stay (HOSPITAL_COMMUNITY): Payer: Medicaid Other

## 2019-09-23 ENCOUNTER — Emergency Department (HOSPITAL_COMMUNITY): Payer: Medicaid Other

## 2019-09-23 DIAGNOSIS — D509 Iron deficiency anemia, unspecified: Secondary | ICD-10-CM | POA: Diagnosis present

## 2019-09-23 DIAGNOSIS — R079 Chest pain, unspecified: Secondary | ICD-10-CM | POA: Diagnosis not present

## 2019-09-23 DIAGNOSIS — E1122 Type 2 diabetes mellitus with diabetic chronic kidney disease: Secondary | ICD-10-CM | POA: Diagnosis present

## 2019-09-23 DIAGNOSIS — F419 Anxiety disorder, unspecified: Secondary | ICD-10-CM | POA: Diagnosis present

## 2019-09-23 DIAGNOSIS — K219 Gastro-esophageal reflux disease without esophagitis: Secondary | ICD-10-CM | POA: Diagnosis present

## 2019-09-23 DIAGNOSIS — T50916A Underdosing of multiple unspecified drugs, medicaments and biological substances, initial encounter: Secondary | ICD-10-CM | POA: Diagnosis present

## 2019-09-23 DIAGNOSIS — N390 Urinary tract infection, site not specified: Secondary | ICD-10-CM | POA: Diagnosis not present

## 2019-09-23 DIAGNOSIS — I129 Hypertensive chronic kidney disease with stage 1 through stage 4 chronic kidney disease, or unspecified chronic kidney disease: Secondary | ICD-10-CM | POA: Diagnosis present

## 2019-09-23 DIAGNOSIS — K2970 Gastritis, unspecified, without bleeding: Secondary | ICD-10-CM | POA: Diagnosis present

## 2019-09-23 DIAGNOSIS — I10 Essential (primary) hypertension: Secondary | ICD-10-CM

## 2019-09-23 DIAGNOSIS — R791 Abnormal coagulation profile: Secondary | ICD-10-CM | POA: Diagnosis present

## 2019-09-23 DIAGNOSIS — A419 Sepsis, unspecified organism: Secondary | ICD-10-CM | POA: Diagnosis not present

## 2019-09-23 DIAGNOSIS — Z794 Long term (current) use of insulin: Secondary | ICD-10-CM

## 2019-09-23 DIAGNOSIS — E119 Type 2 diabetes mellitus without complications: Secondary | ICD-10-CM

## 2019-09-23 DIAGNOSIS — F1910 Other psychoactive substance abuse, uncomplicated: Secondary | ICD-10-CM

## 2019-09-23 DIAGNOSIS — F1410 Cocaine abuse, uncomplicated: Secondary | ICD-10-CM | POA: Diagnosis present

## 2019-09-23 DIAGNOSIS — I745 Embolism and thrombosis of iliac artery: Secondary | ICD-10-CM

## 2019-09-23 DIAGNOSIS — N1832 Chronic kidney disease, stage 3b: Secondary | ICD-10-CM

## 2019-09-23 DIAGNOSIS — Z20828 Contact with and (suspected) exposure to other viral communicable diseases: Secondary | ICD-10-CM | POA: Diagnosis present

## 2019-09-23 DIAGNOSIS — F1110 Opioid abuse, uncomplicated: Secondary | ICD-10-CM | POA: Diagnosis present

## 2019-09-23 DIAGNOSIS — I493 Ventricular premature depolarization: Secondary | ICD-10-CM | POA: Diagnosis present

## 2019-09-23 DIAGNOSIS — F329 Major depressive disorder, single episode, unspecified: Secondary | ICD-10-CM | POA: Diagnosis present

## 2019-09-23 DIAGNOSIS — F1210 Cannabis abuse, uncomplicated: Secondary | ICD-10-CM | POA: Diagnosis present

## 2019-09-23 DIAGNOSIS — R112 Nausea with vomiting, unspecified: Secondary | ICD-10-CM | POA: Diagnosis not present

## 2019-09-23 DIAGNOSIS — E1151 Type 2 diabetes mellitus with diabetic peripheral angiopathy without gangrene: Secondary | ICD-10-CM | POA: Diagnosis present

## 2019-09-23 DIAGNOSIS — E1165 Type 2 diabetes mellitus with hyperglycemia: Secondary | ICD-10-CM | POA: Diagnosis present

## 2019-09-23 DIAGNOSIS — R000 Tachycardia, unspecified: Secondary | ICD-10-CM | POA: Diagnosis not present

## 2019-09-23 DIAGNOSIS — Z91120 Patient's intentional underdosing of medication regimen due to financial hardship: Secondary | ICD-10-CM | POA: Diagnosis not present

## 2019-09-23 DIAGNOSIS — G8929 Other chronic pain: Secondary | ICD-10-CM | POA: Diagnosis present

## 2019-09-23 DIAGNOSIS — R0789 Other chest pain: Secondary | ICD-10-CM

## 2019-09-23 DIAGNOSIS — E213 Hyperparathyroidism, unspecified: Secondary | ICD-10-CM | POA: Diagnosis present

## 2019-09-23 DIAGNOSIS — I088 Other rheumatic multiple valve diseases: Secondary | ICD-10-CM | POA: Diagnosis present

## 2019-09-23 DIAGNOSIS — R1013 Epigastric pain: Secondary | ICD-10-CM | POA: Diagnosis not present

## 2019-09-23 LAB — CBC WITH DIFFERENTIAL/PLATELET
Abs Immature Granulocytes: 0.11 10*3/uL — ABNORMAL HIGH (ref 0.00–0.07)
Basophils Absolute: 0.1 10*3/uL (ref 0.0–0.1)
Basophils Relative: 1 %
Eosinophils Absolute: 0.1 10*3/uL (ref 0.0–0.5)
Eosinophils Relative: 0 %
HCT: 46.5 % — ABNORMAL HIGH (ref 36.0–46.0)
Hemoglobin: 14.5 g/dL (ref 12.0–15.0)
Immature Granulocytes: 1 %
Lymphocytes Relative: 10 %
Lymphs Abs: 2 10*3/uL (ref 0.7–4.0)
MCH: 29.8 pg (ref 26.0–34.0)
MCHC: 31.2 g/dL (ref 30.0–36.0)
MCV: 95.7 fL (ref 80.0–100.0)
Monocytes Absolute: 1.2 10*3/uL — ABNORMAL HIGH (ref 0.1–1.0)
Monocytes Relative: 7 %
Neutro Abs: 15.5 10*3/uL — ABNORMAL HIGH (ref 1.7–7.7)
Neutrophils Relative %: 81 %
Platelets: 339 10*3/uL (ref 150–400)
RBC: 4.86 MIL/uL (ref 3.87–5.11)
RDW: 13.7 % (ref 11.5–15.5)
WBC: 18.9 10*3/uL — ABNORMAL HIGH (ref 4.0–10.5)
nRBC: 0 % (ref 0.0–0.2)

## 2019-09-23 LAB — GLUCOSE, CAPILLARY
Glucose-Capillary: 147 mg/dL — ABNORMAL HIGH (ref 70–99)
Glucose-Capillary: 142 mg/dL — ABNORMAL HIGH (ref 70–99)
Glucose-Capillary: 149 mg/dL — ABNORMAL HIGH (ref 70–99)
Glucose-Capillary: 216 mg/dL — ABNORMAL HIGH (ref 70–99)
Glucose-Capillary: 187 mg/dL — ABNORMAL HIGH (ref 70–99)

## 2019-09-23 LAB — COMPREHENSIVE METABOLIC PANEL
ALT: 12 U/L (ref 0–44)
AST: 11 U/L — ABNORMAL LOW (ref 15–41)
Albumin: 3.4 g/dL — ABNORMAL LOW (ref 3.5–5.0)
Alkaline Phosphatase: 78 U/L (ref 38–126)
Anion gap: 10 (ref 5–15)
BUN: 12 mg/dL (ref 6–20)
CO2: 23 mmol/L (ref 22–32)
Calcium: 9 mg/dL (ref 8.9–10.3)
Chloride: 104 mmol/L (ref 98–111)
Creatinine, Ser: 1.63 mg/dL — ABNORMAL HIGH (ref 0.44–1.00)
GFR calc Af Amer: 44 mL/min — ABNORMAL LOW (ref >60)
GFR calc non Af Amer: 38 mL/min — ABNORMAL LOW (ref >60)
Glucose, Bld: 192 mg/dL — ABNORMAL HIGH (ref 70–99)
Potassium: 3.5 mmol/L (ref 3.5–5.1)
Sodium: 137 mmol/L (ref 135–145)
Total Bilirubin: 0.5 mg/dL (ref 0.3–1.2)
Total Protein: 7 g/dL (ref 6.5–8.1)

## 2019-09-23 LAB — MAGNESIUM: Magnesium: 1.8 mg/dL (ref 1.7–2.4)

## 2019-09-23 LAB — TROPONIN I (HIGH SENSITIVITY): Troponin I (High Sensitivity): 15 ng/L (ref <18)

## 2019-09-23 LAB — HEMOGLOBIN A1C
Hgb A1c MFr Bld: 8.6 % — ABNORMAL HIGH (ref 4.8–5.6)
Mean Plasma Glucose: 200.12 mg/dL

## 2019-09-23 LAB — HIV ANTIBODY (ROUTINE TESTING W REFLEX): HIV Screen 4th Generation wRfx: NONREACTIVE

## 2019-09-23 LAB — CBG MONITORING, ED: Glucose-Capillary: 182 mg/dL — ABNORMAL HIGH (ref 70–99)

## 2019-09-23 LAB — LACTIC ACID, PLASMA
Lactic Acid, Venous: 1.7 mmol/L (ref 0.5–1.9)
Lactic Acid, Venous: 1.4 mmol/L (ref 0.5–1.9)

## 2019-09-23 LAB — SARS CORONAVIRUS 2 (TAT 6-24 HRS): SARS Coronavirus 2: NEGATIVE

## 2019-09-23 MED ORDER — WARFARIN SODIUM 10 MG PO TABS
10.0000 mg | ORAL_TABLET | Freq: Once | ORAL | Status: AC
  Administered 2019-09-23: 10 mg via ORAL
  Filled 2019-09-23: qty 1

## 2019-09-23 MED ORDER — PANTOPRAZOLE SODIUM 40 MG IV SOLR
40.0000 mg | Freq: Two times a day (BID) | INTRAVENOUS | Status: DC
  Administered 2019-09-23 – 2019-09-24 (×3): 40 mg via INTRAVENOUS
  Filled 2019-09-23 (×3): qty 40

## 2019-09-23 MED ORDER — NITROGLYCERIN 0.4 MG SL SUBL: 0.4000 mg | SUBLINGUAL_TABLET | SUBLINGUAL | Status: DC | PRN

## 2019-09-23 MED ORDER — SODIUM CHLORIDE 0.9% FLUSH
3.0000 mL | Freq: Two times a day (BID) | INTRAVENOUS | Status: DC
  Administered 2019-09-23 – 2019-09-24 (×2): 3 mL via INTRAVENOUS

## 2019-09-23 MED ORDER — ALBUTEROL SULFATE HFA 108 (90 BASE) MCG/ACT IN AERS
2.0000 | INHALATION_SPRAY | Freq: Four times a day (QID) | RESPIRATORY_TRACT | Status: DC | PRN
  Filled 2019-09-23: qty 6.7

## 2019-09-23 MED ORDER — LORATADINE 10 MG PO TABS: 10.0000 mg | ORAL_TABLET | Freq: Every day | ORAL | Status: DC | PRN

## 2019-09-23 MED ORDER — NICOTINE 14 MG/24HR TD PT24
14.0000 mg | MEDICATED_PATCH | Freq: Every day | TRANSDERMAL | Status: DC
  Administered 2019-09-23 – 2019-09-24 (×2): 14 mg via TRANSDERMAL
  Filled 2019-09-23 (×2): qty 1

## 2019-09-23 MED ORDER — ENOXAPARIN SODIUM 120 MG/0.8ML ~~LOC~~ SOLN
110.0000 mg | Freq: Two times a day (BID) | SUBCUTANEOUS | Status: DC
  Administered 2019-09-23 (×2): 110 mg via SUBCUTANEOUS
  Filled 2019-09-23: qty 0.73
  Filled 2019-09-23 (×2): qty 0.8

## 2019-09-23 MED ORDER — TECHNETIUM TO 99M ALBUMIN AGGREGATED
1.4800 | Freq: Once | INTRAVENOUS | Status: AC | PRN
  Administered 2019-09-23: 1.48 via INTRAVENOUS

## 2019-09-23 MED ORDER — FERROUS SULFATE 325 (65 FE) MG PO TABS
325.0000 mg | ORAL_TABLET | Freq: Every day | ORAL | Status: DC
  Administered 2019-09-24: 325 mg via ORAL
  Filled 2019-09-23: qty 1

## 2019-09-23 MED ORDER — SENNOSIDES-DOCUSATE SODIUM 8.6-50 MG PO TABS: 1.0000 | ORAL_TABLET | Freq: Every evening | ORAL | Status: DC | PRN

## 2019-09-23 MED ORDER — SODIUM CHLORIDE 0.9 % IV SOLN: INTRAVENOUS | Status: AC

## 2019-09-23 MED ORDER — AMLODIPINE BESYLATE 5 MG PO TABS
5.0000 mg | ORAL_TABLET | Freq: Every day | ORAL | Status: DC
  Administered 2019-09-23 – 2019-09-24 (×2): 5 mg via ORAL
  Filled 2019-09-23 (×2): qty 1

## 2019-09-23 MED ORDER — FOLIC ACID 1 MG PO TABS
1.0000 mg | ORAL_TABLET | Freq: Every day | ORAL | Status: DC
  Administered 2019-09-23 – 2019-09-24 (×2): 1 mg via ORAL
  Filled 2019-09-23 (×2): qty 1

## 2019-09-23 MED ORDER — INSULIN GLARGINE 100 UNIT/ML ~~LOC~~ SOLN
20.0000 [IU] | Freq: Every day | SUBCUTANEOUS | Status: DC
  Filled 2019-09-23: qty 0.2

## 2019-09-23 MED ORDER — GABAPENTIN 400 MG PO CAPS
400.0000 mg | ORAL_CAPSULE | Freq: Three times a day (TID) | ORAL | Status: DC
  Administered 2019-09-23 – 2019-09-24 (×4): 400 mg via ORAL
  Filled 2019-09-23 (×5): qty 1

## 2019-09-23 MED ORDER — HYDRALAZINE HCL 20 MG/ML IJ SOLN: 10.0000 mg | Freq: Four times a day (QID) | INTRAMUSCULAR | Status: DC | PRN

## 2019-09-23 MED ORDER — SERTRALINE HCL 50 MG PO TABS
50.0000 mg | ORAL_TABLET | Freq: Every day | ORAL | Status: DC
  Administered 2019-09-23 – 2019-09-24 (×2): 50 mg via ORAL
  Filled 2019-09-23 (×2): qty 1

## 2019-09-23 MED ORDER — ACETAMINOPHEN 325 MG PO TABS: 650.0000 mg | ORAL_TABLET | Freq: Four times a day (QID) | ORAL | Status: DC | PRN

## 2019-09-23 MED ORDER — MENTHOL 3 MG MT LOZG
1.0000 | LOZENGE | OROMUCOSAL | Status: DC | PRN
  Filled 2019-09-23: qty 9

## 2019-09-23 MED ORDER — BUPROPION HCL ER (SR) 150 MG PO TB12
150.0000 mg | ORAL_TABLET | Freq: Two times a day (BID) | ORAL | Status: DC
  Administered 2019-09-23 – 2019-09-24 (×3): 150 mg via ORAL
  Filled 2019-09-23 (×3): qty 1

## 2019-09-23 MED ORDER — INSULIN ASPART 100 UNIT/ML ~~LOC~~ SOLN
0.0000 [IU] | SUBCUTANEOUS | Status: DC
  Administered 2019-09-23: 2 [IU] via SUBCUTANEOUS
  Administered 2019-09-23: 1 [IU] via SUBCUTANEOUS
  Administered 2019-09-23: 3 [IU] via SUBCUTANEOUS
  Administered 2019-09-23: 1 [IU] via SUBCUTANEOUS
  Administered 2019-09-23: 3 [IU] via SUBCUTANEOUS
  Administered 2019-09-24: 1 [IU] via SUBCUTANEOUS

## 2019-09-23 MED ORDER — HYDROXYZINE HCL 25 MG PO TABS
50.0000 mg | ORAL_TABLET | Freq: Three times a day (TID) | ORAL | Status: DC | PRN
  Administered 2019-09-23 – 2019-09-24 (×3): 50 mg via ORAL
  Filled 2019-09-23 (×3): qty 2

## 2019-09-23 MED ORDER — SODIUM CHLORIDE 0.9 % IV SOLN
1.0000 g | INTRAVENOUS | Status: DC
  Administered 2019-09-23: 1 g via INTRAVENOUS
  Filled 2019-09-23: qty 10

## 2019-09-23 MED ORDER — WARFARIN - PHARMACIST DOSING INPATIENT: Freq: Every day | Status: DC

## 2019-09-23 MED ORDER — ASPIRIN EC 81 MG PO TBEC
81.0000 mg | DELAYED_RELEASE_TABLET | Freq: Every day | ORAL | Status: DC
  Administered 2019-09-23 – 2019-09-24 (×2): 81 mg via ORAL
  Filled 2019-09-23 (×2): qty 1

## 2019-09-23 MED ORDER — INSULIN GLARGINE 100 UNIT/ML ~~LOC~~ SOLN
73.0000 [IU] | Freq: Every day | SUBCUTANEOUS | Status: DC
  Administered 2019-09-23: 73 [IU] via SUBCUTANEOUS
  Filled 2019-09-23 (×2): qty 0.73

## 2019-09-23 MED ORDER — HYDROCODONE-ACETAMINOPHEN 5-325 MG PO TABS
1.0000 | ORAL_TABLET | ORAL | Status: DC | PRN
  Administered 2019-09-23 – 2019-09-24 (×5): 2 via ORAL
  Administered 2019-09-24: 1 via ORAL
  Administered 2019-09-24: 2 via ORAL
  Administered 2019-09-24: 1 via ORAL
  Filled 2019-09-23 (×8): qty 2

## 2019-09-23 MED ORDER — PANTOPRAZOLE SODIUM 40 MG PO TBEC
40.0000 mg | DELAYED_RELEASE_TABLET | Freq: Every day | ORAL | Status: DC
  Administered 2019-09-23: 40 mg via ORAL
  Filled 2019-09-23: qty 1

## 2019-09-23 MED ORDER — SODIUM CHLORIDE 0.9 % IV BOLUS
1000.0000 mL | Freq: Once | INTRAVENOUS | Status: AC
  Administered 2019-09-23: 1000 mL via INTRAVENOUS

## 2019-09-23 MED ORDER — ACETAMINOPHEN 650 MG RE SUPP: 650.0000 mg | Freq: Four times a day (QID) | RECTAL | Status: DC | PRN

## 2019-09-23 MED ORDER — VITAMIN B-12 1000 MCG PO TABS
2000.0000 ug | ORAL_TABLET | Freq: Every day | ORAL | Status: DC
  Administered 2019-09-23 – 2019-09-24 (×2): 2000 ug via ORAL
  Filled 2019-09-23 (×2): qty 2

## 2019-09-23 MED ORDER — SUCRALFATE 1 GM/10ML PO SUSP
1.0000 g | Freq: Three times a day (TID) | ORAL | Status: DC
  Administered 2019-09-23 – 2019-09-24 (×4): 1 g via ORAL
  Filled 2019-09-23 (×5): qty 10

## 2019-09-23 MED ORDER — MORPHINE SULFATE (PF) 4 MG/ML IV SOLN
6.0000 mg | Freq: Once | INTRAVENOUS | Status: AC
  Administered 2019-09-23: 6 mg via INTRAVENOUS
  Filled 2019-09-23: qty 2

## 2019-09-23 NOTE — Progress Notes (Signed)
PROGRESS NOTE    Terri Sparks  [id number] DOB: 02-27-1976 DOA: 09/22/2019 PCP: Ladell Pier, MD   Brief Narrative:  HPI: Terri Sparks is a 43 y.o. 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  She has been out of her medications for 2 months due to financial constraints.  Patient developed abdominal pain roughly 3 days ago, describing both epigastric pain and right flank pain.  She also has chronic left flank pain that has been unchanged.  This has been associated with nausea and vomiting.  She reports seeing some dark vomitus.  She denies diarrhea, melena, or hematochezia.  She also denies fevers, chills, or cough.  She reports some chest pain for the past day or so that she has been attributing to the vomiting.  She reports chronic left lower leg pain that is unchanged.  ED Course: Upon arrival to the ED, patient is found to be afebrile, saturating well on room air, tachypneic, tachycardic, and with stable blood pressure.  EKG features sinus tachycardia with rate 112 and PVCs.  Chest x-ray is negative for acute cardiopulmonary disease.  CT the abdomen and pelvis is negative for acute intra-abdominal or pelvic abnormality.  Right upper quadrant ultrasound is negative.  VQ scan is within normal limits.  High-sensitivity troponin is normal x2.  INR 0.9.  UDS positive for opiates, cocaine, and THC.  Chemistry panel notable for glucose 256 and creatinine 1.84, similar to priors.  CBC features a leukocytosis to 22,500, slight polycythemia, and platelets 408,000.  Urinalysis concerning for UTI.  Fecal occult blood testing negative.  Blood and urine cultures were collected, 2 L normal  saline administered, and patient was started on empiric Rocephin.  COVID-19 screening test has not yet resulted.  Hospitalist asked to admit.  Assessment & Plan:   Principal Problem:   Sepsis secondary to UTI [facility]) Active Problems:   Insulin-requiring or dependent type II diabetes mellitus ([place])   Anxiety and depression   Thrombosis of left iliac artery ([facility])   Essential hypertension   Subtherapeutic international normalized ratio (INR)   Stage 3b chronic kidney disease   Polysubstance abuse ([place])   1. Sepsis secondary to UTI  - Presents with abdominal pain and N/V and is found to have tachycardia, tachypnea, leukocytosis, and ED-workup concerning for recurrent UTI  - Blood and urine cultures collected in ED, 2 liters NS given, lactate pending, and she was started on empiric Rocephin.  Looks like urine culture was never collected.  Continue Rocephin while she is in the hospital.  2. Chest pain : Still with some intermittent chest pressure which is sometimes sharp.  Troponin x3 negative.  Last echo done almost 18 months ago showed reduced ejection fraction.  We will repeat echo today.  Her chest pain could very well be due to cocaine.  3. Insulin-dependent DM  - A1c was 8.3% in September 2020  and 8.6 today.  Blood sugar have been stable.  She is on 73 units of Lantus at home.  Will resume that and continue SSI.  Might need increased dosage of Lantus at the time of discharge.  4. CKD IIIb : At baseline.  Continue to watch.  5. Polysubstance abuse  - UDS positive for opiates, cocaine, and THC  however she denies using opiates  or cocaine but admits using THC.  Her last use of THC was also on Wednesday according to her.  Cessation counseled.  6. Peripheral arterial occlusive disease; history of DVT - Hx of open and percutaneous interventions on LE arterial thrombi, follows with vascular and managed with warfarin and ASA   - She has been out of medications for 2 months d/t financial  constraints and INR is 0.9  - No acute ischemia  - Resumed ASA and warfarin, bridge with Lovenox.  She has no insurance which makes her a poor candidate for NOACs.  7.  Upper abdominal pain/?  Hematemesis: She reports having seen blood on one of the vomiting that she had prior to admission and then she also saw black-colored stool.  Wondering if she had Mallory-Weiss tear and her abdominal/chest pain is in fact a likely gastritis/gastric ulcer.  Consulted GI.  Since her hemoglobin is stable, GI does not plan to do EGD and recommended continuing PPI and Carafate was added.  Watch CBC tomorrow morning.  Continues on clears.  Will advance diet as tolerated.  DVT prophylaxis: Lovenox and Coumadin. Code Status: Full code Family Communication: None present at bedside.  Plan of care discussed with patient in length and he verbalized understanding and agreed with it. Disposition Plan: Potential discharge tomorrow.  Estimated body mass index is 39.47 kg/m as calculated from the following:   Height as of this encounter: 5\' 7"  (1.702 m).   Weight as of this encounter: 114.3 kg.      Nutritional status:               Consultants:   GI  Procedures:   None  Antimicrobials:   Rocephin   Subjective: Seen and examined.  Continues to complain of upper abdomen/chest pain which is pressure-like or sharp sometimes.  Worse with deep breathing and burping.  Some shortness of breath.  No other complaint.  Objective: Vitals:   09/23/19 0230 09/23/19 0400 09/23/19 0557 09/23/19 0953  BP: (!) 142/86 127/84 (!) 147/65 135/88  Pulse: (!) 115 (!) 116 (!) 125 (!) 111  Resp: 15  20 20   Temp:   98.5 F (36.9 C) 98.3 F (36.8 C)  TempSrc:   Oral Oral  SpO2: 97% 94% 100% 99%  Weight:      Height:        Intake/Output Summary (Last 24 hours) at 09/23/2019 1337 Last data filed at 09/23/2019 1000 Gross per 24 hour  Intake 324.81 ml  Output --  Net 324.81 ml   Filed Weights   09/22/19  1541  Weight: 114.3 kg    Examination:  General exam: Appears calm and comfortable  Respiratory system: Clear to auscultation. Respiratory effort normal. Cardiovascular system: S1 & S2 heard, RRR. No JVD, murmurs, rubs, gallops or clicks. No pedal edema. Gastrointestinal system: Abdomen is nondistended, soft and mild epigastric tenderness. No organomegaly or masses felt. Normal bowel sounds heard. Central nervous system: Alert and oriented. No focal neurological deficits. Extremities: Symmetric 5 x 5 power. Skin: No rashes, lesions or ulcers Psychiatry: Judgement and insight appear normal. Mood & affect appropriate.    Data Reviewed: I have personally reviewed following labs and imaging studies  CBC: Recent Labs  Lab 09/22/19 1550 09/23/19 0523  WBC 22.5* 18.9*  NEUTROABS  --  15.5*  HGB 16.2* 14.5  HCT 50.4* 46.5*  MCV 93.0 95.7  PLT 408* [id number]   Basic Metabolic Panel: Recent Labs  Lab 09/22/19 1550 09/23/19 0523  NA 133*  137  K 3.7 3.5  CL 94* 104  CO2 25 23  GLUCOSE 256* 192*  BUN 14 12  CREATININE 1.84* 1.63*  CALCIUM 10.2 9.0  MG  --  1.8   GFR: Estimated Creatinine Clearance: 57.5 mL/min (A) (by C-G formula based on SCr of 1.63 mg/dL (H)). Liver Function Tests: Recent Labs  Lab 09/22/19 1550 09/23/19 0523  AST 14* 11*  ALT 15 12  ALKPHOS 93 78  BILITOT 0.6 0.5  PROT 8.2* 7.0  ALBUMIN 4.2 3.4*   No results for input(s): LIPASE, AMYLASE in the last 168 hours. No results for input(s): AMMONIA in the last 168 hours. Coagulation Profile: Recent Labs  Lab 09/22/19 1550  INR 0.9   Cardiac Enzymes: No results for input(s): CKTOTAL, CKMB, CKMBINDEX, TROPONINI in the last 168 hours. BNP (last 3 results) No results for input(s): PROBNP in the last 8760 hours. HbA1C: Recent Labs    09/23/19 0523  HGBA1C 8.6*   CBG: Recent Labs  Lab 09/23/19 0506 09/23/19 0614 09/23/19 0808 09/23/19 1152  GLUCAP 182* 147* 142* 149*   Lipid Profile: No  results for input(s): CHOL, HDL, LDLCALC, TRIG, CHOLHDL, LDLDIRECT in the last 72 hours. Thyroid Function Tests: No results for input(s): TSH, T4TOTAL, FREET4, T3FREE, THYROIDAB in the last 72 hours. Anemia Panel: No results for input(s): VITAMINB12, FOLATE, FERRITIN, TIBC, IRON, RETICCTPCT in the last 72 hours. Sepsis Labs: Recent Labs  Lab 09/23/19 0258 09/23/19 0522  LATICACIDVEN 1.7 1.4    Recent Results (from the past 240 hour(s))  SARS CORONAVIRUS 2 (TAT 6-24 HRS) Nasopharyngeal Nasopharyngeal Swab     Status: None   Collection Time: 09/23/19  2:56 AM   Specimen: Nasopharyngeal Swab  Result Value Ref Range Status   SARS Coronavirus 2 NEGATIVE NEGATIVE Final    Comment: (NOTE) SARS-CoV-2 target nucleic acids are NOT DETECTED. The SARS-CoV-2 RNA is generally detectable in upper and lower respiratory specimens during the acute phase of infection. Negative results do not preclude SARS-CoV-2 infection, do not rule out co-infections with other pathogens, and should not be used as the sole basis for treatment or other patient management decisions. Negative results must be combined with clinical observations, patient history, and epidemiological information. The expected result is Negative. Fact Sheet for Patients: SugarRoll.be Fact Sheet for Healthcare Providers: https://www.woods-mathews.com/ This test is not yet approved or cleared by the [place] FDA and  has been authorized for detection and/or diagnosis of SARS-CoV-2 by FDA under an Emergency Use Authorization (EUA). This EUA will remain  in effect (meaning this test can be used) for the duration of the COVID-19 declaration under Section 56 4(b)(1) of the Act, 21 U.S.C. section 360bbb-3(b)(1), unless the authorization is terminated or revoked sooner. Performed at [facility], [place] [address], [place], [place] [address]   Culture, blood (routine x 2)     Status: None  (Preliminary result)   Collection Time: 09/23/19  5:25 AM   Specimen: BLOOD LEFT ARM  Result Value Ref Range Status   Specimen Description BLOOD LEFT ARM  Final   Special Requests   Final    BOTTLES DRAWN AEROBIC AND ANAEROBIC Blood Culture adequate volume PATIENT ON FOLLOWING ROCEPHIN Performed at [facility], [place] [address], [place], [place] [address]    Culture PENDING  Incomplete   Report Status PENDING  Incomplete      Radiology Studies: CT ABDOMEN PELVIS WO CONTRAST  Result Date: 09/22/2019 CLINICAL DATA:  Chest pain. Abdominal pain. EXAM:  CT ABDOMEN AND PELVIS WITHOUT CONTRAST TECHNIQUE: Multidetector CT imaging of the abdomen and pelvis was performed following the standard protocol without IV contrast. COMPARISON:  06/11/2019 FINDINGS: Lower chest: The lung bases are clear. The heart size is normal. Hepatobiliary: The liver is normal. The gallbladder is distended without definite evidence for acute cholecystitis.There is no biliary ductal dilation. Pancreas: Normal contours without ductal dilatation. No peripancreatic fluid collection. Spleen: No splenic laceration or hematoma. Adrenals/Urinary Tract: --Adrenal glands: There is a stable left adrenal adenoma. --Right kidney/ureter: There are multiple nonobstructing stones in the lower pole the right kidney. There is minimal right-sided collecting system dilatation without evidence for an obstructing stone. The previously noted right ureteral stone has resolved. --Left kidney/ureter: The left kidney is somewhat atrophic in comparison to the right. Multiple nonobstructing stones are noted. There are several calcifications in the cortex. Pockets of gas are again noted and are relatively similar to prior study. There is no hydronephrosis. --Urinary bladder: Small amount of gas is noted within the nondependent portion of the urinary bladder. Stomach/Bowel: --Stomach/Duodenum: No hiatal hernia or other gastric abnormality. Normal duodenal  course and caliber. --Small bowel: No dilatation or inflammation. --Colon: No focal abnormality. --Appendix: Normal. Vascular/Lymphatic: Atherosclerotic calcification is present within the non-aneurysmal abdominal aorta, without hemodynamically significant stenosis. A left common iliac artery stent noted. --No retroperitoneal lymphadenopathy. --No mesenteric lymphadenopathy. --No pelvic or inguinal lymphadenopathy. Reproductive: Unremarkable Other: No ascites or free air. There are postsurgical changes of the left inguinal region. Musculoskeletal. No acute displaced fractures. IMPRESSION: 1. No CT evidence for acute intra-abdominal or pelvic pathology. 2. Interval resolution of the previously noted right ureteral stone. 3. Bilateral nonobstructive nephrolithiasis. 4. Small amount of gas within the nondependent portion of the urinary bladder. Correlate with recent instrumentation. Again noted is gas within the left kidney, similar to prior study. This too is favored to be secondary to prior instrumentation. A gas-forming organism is not entirely excluded and should be correlated with urinalysis. 5. Distended mild distention of the gallbladder without CT evidence for acute cholecystitis. Aortic Atherosclerosis (ICD10-I70.0). Electronically Signed   By: Constance Holster M.D.   On: 09/22/2019 19:22   DG Chest 2 View  Result Date: 09/22/2019 CLINICAL DATA:  Chest pain beginning yesterday. Vomiting. Hematemesis. EXAM: CHEST - 2 VIEW COMPARISON:  06/26/2018 FINDINGS: The heart size and mediastinal contours are within normal limits. Both lungs are clear. The visualized skeletal structures are unremarkable. IMPRESSION: Negative.  No active cardiopulmonary disease. Electronically Signed   By: Marlaine Hind M.D.   On: 09/22/2019 17:32   NM Pulmonary Perfusion  Result Date: 09/23/2019 CLINICAL DATA:  Chest pain. EXAM: NUCLEAR MEDICINE PERFUSION LUNG SCAN TECHNIQUE: Perfusion images were obtained in multiple  projections after intravenous injection of radiopharmaceutical. Ventilation scans intentionally deferred if perfusion scan and chest x-ray adequate for interpretation during COVID 19 epidemic. RADIOPHARMACEUTICALS:  1.48 mCi Tc-29m MAA IV COMPARISON:  Chest radiograph 8 hours ago FINDINGS: Homogeneous perfusion to both lungs.  No focal perfusion defect. IMPRESSION: No evidence of pulmonary embolus.  Normal perfusion scan. Electronically Signed   By: Keith Rake M.D.   On: 09/23/2019 01:59   US Abdomen Limited  Result Date: 09/22/2019 CLINICAL DATA:  Right upper quadrant pain for 3 days. EXAM: ULTRASOUND ABDOMEN LIMITED RIGHT UPPER QUADRANT COMPARISON:  None. FINDINGS: Technically difficult exam due to patient habitus. Gallbladder: No gallstones or wall thickening visualized. No sonographic Murphy sign noted by sonographer. Common bile duct: Diameter: 3 mm, within normal limits. Liver: No focal  lesion identified. Within normal limits in parenchymal echogenicity. Portal vein is patent on color Doppler imaging with normal direction of blood flow towards the liver. Other: None. IMPRESSION: Negative.  No hepatobiliary abnormality identified. Electronically Signed   By: Marlaine Hind M.D.   On: 09/22/2019 17:09    Scheduled Meds: . amLODipine  5 mg Oral Daily  . aspirin EC  81 mg Oral Daily  . buPROPion  150 mg Oral BID  . enoxaparin (LOVENOX) injection  110 mg Subcutaneous BID  . [START ON 09/24/2019] ferrous sulfate  325 mg Oral Q breakfast  . folic acid  1 mg Oral Daily  . gabapentin  400 mg Oral TID  . insulin aspart  0-9 Units Subcutaneous Q4H  . insulin glargine  20 Units Subcutaneous QHS  . nicotine  14 mg Transdermal Daily  . pantoprazole (PROTONIX) IV  40 mg Intravenous Q12H  . sertraline  50 mg Oral Daily  . sodium chloride flush  3 mL Intravenous Q12H  . sucralfate  1 g Oral TID WC & HS  . vitamin B-12  2,000 mcg Oral Daily  . warfarin  10 mg Oral ONCE-1800  . Warfarin - Pharmacist  Dosing Inpatient   Does not apply q1800   Continuous Infusions: . sodium chloride 100 mL/hr at 09/23/19 0643  . cefTRIAXone (ROCEPHIN)  IV       LOS: 0 days   Time spent: 35 minutes   Darliss Cheney, MD [facility]  09/23/2019, 1:37 PM   To contact the attending provider between 7A-7P or the covering provider during after hours 7P-7A, please log into the web site [email or web address] and use password [email or web address].

## 2019-09-23 NOTE — ED Notes (Signed)
ED TO INPATIENT HANDOFF REPORT  ED Nurse Name and Phone #: [phone]  S Name/Age/Gender Terri Sparks 43 y.o. female Room/Bed: 008C/008C  Code Status   Code Status: Full Code  Home/SNF/Other Home Patient oriented to: self, place, time and situation Is this baseline? Yes   Triage Complete: Triage complete  Chief Complaint Sepsis secondary to UTI ([place]) [A41.9, N39.0]  Triage Note Pt reports chest pain since last night along with abd pain and emesis for the past 3 days. Pt states last night her vomit was a redish-orange color and today her vomit was black. Pt a.o, nad noted.     Allergies Allergies  Allergen Reactions  . Ciprofloxacin Hcl Hives  . Dilaudid [Hydromorphone Hcl] Shortness Of Breath and Other (See Comments)    "Asystole," per pt report  . Macrobid [Nitrofurantoin Macrocrystal] Hives, Shortness Of Breath and Rash  . Other Hives, Shortness Of Breath and Rash    NO "-CILLINS"!!!  . Penicillins Shortness Of Breath    Had had cephalosporins without incident Has patient had a PCN reaction causing immediate rash, facial/tongue/throat swelling, SOB or lightheadedness with hypotension: Yes Has patient had a PCN reaction causing severe rash involving mucus membranes or skin necrosis: Unk Has patient had a PCN reaction that required hospitalization: Unk Has patient had a PCN reaction occurring within the last 10 years: No If all of the above answers are "NO", then may proceed with Cephalosporin use.   . Sulfa Antibiotics Hives, Shortness Of Breath and Rash  . Lexapro [Escitalopram Oxalate] Other (See Comments)    "I just did not like it."    Level of Care/Admitting Diagnosis ED Disposition    ED Disposition Condition [place]: [place] [100100]  Level of Care: Telemetry Medical [104]  I expect the patient will be discharged within 24 hours: Yes  LOW acuity---Tx typically complete <24 hrs---ACUTE conditions typically can  be evaluated <24 hours---LABS likely to return to acceptable levels <24 hours---IS near functional baseline---EXPECTED to return to current living arrangement---NOT newly hypoxic: Does not meet criteria for 5C-Observation unit  Covid Evaluation: Asymptomatic Screening Protocol (No Symptoms)  Diagnosis: [id number]  Admitting Physician: Vianne Bulls [id number]  Attending Physician: Vianne Bulls [id number]       B Medical/Surgery History Past Medical History:  Diagnosis Date  . Anxiety   . Depression   . Diabetes ([place])    Type 2  . GERD (gastroesophageal reflux disease)   . History of blood clots    ,DVT-left leg, and early 2000, had blood clot behind left breast  . History of kidney stones   . Hypercalcemia   . Hyperparathyroidism   . Hypertension    had been on Lisinopril and PCP took her off it 4 months ago  . Iron deficiency anemia   . Left thyroid nodule   . Mass of right ovary   . Nephrolithiasis   . 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 Peripheral vascular disease ([place])   . Prolonged Q-T interval on ECG 04/19/2019  . Renal calculi   . Substance abuse ([place])   . Tooth loose    top tooth from the right  is loose    Past Surgical History:  Procedure Laterality Date  . ABDOMINAL AORTOGRAM W/LOWER EXTREMITY N/A 11/14/2017   Procedure: ABDOMINAL AORTOGRAM W/LOWER EXTREMITY;  Surgeon: Waynetta Sandy, MD;  Location: [facility];  Service: Cardiovascular;  Laterality:  N/A;  . ANGIOPLASTY ILLIAC ARTERY Left 02/25/2018   Procedure: BALLOON ANGIOPLASTY LEFT ILIAC ARTERY;  Surgeon: Conrad Bodfish, MD;  Location: [facility];  Service: Vascular;  Laterality: Left;  . APPLICATION OF WOUND VAC Left 03/03/2018   Procedure: APPLICATION OF WOUND VAC;  Surgeon: Angelia Mould, MD;  Location: [facility];  Service: Vascular;  Laterality: Left;  . CYSTOSCOPY W/ URETERAL STENT PLACEMENT Right 06/27/2018   Procedure: CYSTOSCOPY WITH  RETROGRADE PYELOGRAM/URETERAL DOUBLE J STENT PLACEMENT;  Surgeon: Ceasar Mons, MD;  Location: [facility];  Service: Urology;  Laterality: Right;  . CYSTOSCOPY WITH STENT PLACEMENT N/A 02/23/2019   Procedure: CYSTOSCOPY WITH RIGHT URETERAL STENT EXCHANGE/ LEFT URETERAL STENT PLACEMENT;  Surgeon: Ceasar Mons, MD;  [facility];  Service: Urology;  Laterality: N/A;  . CYSTOSCOPY WITH STENT PLACEMENT Right 06/11/2019   Procedure: CYSTOSCOPY,RIGHT RETROGRADE WITH STENT PLACEMENT;  Surgeon: Irine Seal, MD;  Location[facility];  Service: Urology;  Laterality: Right;  . CYSTOSCOPY/RETROGRADE/URETEROSCOPY/STONE EXTRACTION WITH BASKET Bilateral 05/01/2019   Procedure: CYSTOSCOPY/URETEROSCOPY/STONE EXTRACTION / LASER LITHOTRIPSY, BILATERAL URETEROSCOPY WITH URETERAL STONE EXTRACTION AND STENT EXCHANGE;  Surgeon: Ceasar Mons, MD;  Location: [facility];  Service: Urology;  Laterality: Bilateral;  . CYSTOSCOPY/URETEROSCOPY/HOLMIUM LASER/STENT PLACEMENT Right 06/27/2019   Procedure: CYSTOSCOPY/URETEROSCOPY/HOLMIUM LASER/STENT EXCHANGE;  Surgeon: Ceasar Mons, MD;  [facility];  Service: Urology;  Laterality: Right;  . EMBOLECTOMY Left 02/24/2017   Procedure: Left Lower Extremity Embolectomy and Angiogram.;  Surgeon: Waynetta Sandy, MD;  Location: [facility];  Service: Vascular;  Laterality: Left;  . INSERTION OF ILIAC STENT  02/24/2017   Procedure: INSERTION OF Common ILIAC STENT;  Surgeon: Waynetta Sandy, MD;  Location: [facility];  Service: Vascular;;  . INTRAOPERATIVE ARTERIOGRAM Left 02/25/2018   Procedure: INTRA OPERATIVE ARTERIOGRAM WITH LEFT LEG RUNOFF;  Surgeon: Conrad [place], MD;  Location: [facility];  Service: Vascular;  Laterality: Left;  . LOWER EXTREMITY ANGIOGRAM Left 02/24/2017   Procedure: Aortagram, Left lower extremity Run-off;  Surgeon: Waynetta Sandy, MD;  Location: [facility];  Service: Vascular;  Laterality: Left;   . PATCH ANGIOPLASTY Left 02/25/2018   Procedure: PATCH ANGIOPLASTY LEFT SUPERFICIAL FEMORAL ARTERY WITH BOVINE PATCH;  Surgeon: Conrad , MD;  Location: [facility];  Service: Vascular;  Laterality: Left;  . PERCUTANEOUS VENOUS THROMBECTOMY,LYSIS WITH INTRAVASCULAR ULTRASOUND (IVUS) Left 05/12/2018   Procedure: MECHANICAL THROMBECTOMY LEFT LEG, BALLOON ANGIOPLASTY LEFT ANTERIOR TIBIAL ARTERY, AORTOGRAM WITH LEFT LEG RUNOFF;  Surgeon: Serafina Mitchell, MD;  Location: [facility];  Service: Vascular;  Laterality: Left;  . 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 THROMBECTOMY FEMORAL ARTERY Left 02/25/2018   Procedure: LEFT ILIAC AND POPLITEAL ARTERY THROMBECTOMY;  Surgeon: Conrad , MD;  Location: [facility];  Service: Vascular;  Laterality: Left;  . TUBAL LIGATION    . WOUND DEBRIDEMENT Left 03/03/2018   Procedure: DEBRIDEMENT WOUND;  Surgeon: Angelia Mould, MD;  Location: [facility];  Service: Vascular;  Laterality: Left;  . WOUND EXPLORATION Left 03/03/2018   Procedure: WOUND EXPLORATION;  Surgeon: Angelia Mould, MD;  Location: [facility];  Service: Vascular;  Laterality: Left;     A IV Location/Drains/Wounds Patient Lines/Drains/Airways Status   Active Line/Drains/Airways    Name:   Placement date:   Placement time:   Site:   Days:   Peripheral IV 09/23/19 Right Antecubital   09/23/19    0301    Antecubital   less than 1   Negative  Pressure Wound Therapy Groin Left   03/03/18    1128    --   569   Ureteral Drain/Stent Left ureter 6 Fr.   05/01/19    0856    Left ureter   145   Ureteral Drain/Stent Right ureter 6 Fr.   05/01/19    0923    Right ureter   145   Ureteral Drain/Stent Right ureter 6 Fr.   06/27/19    1017    Right ureter   88   Incision (Closed) 02/25/18 Groin Left   02/25/18    1020     575   Incision (Closed) 03/03/18 Groin Left   03/03/18    1133     569   Incision (Closed) 05/12/18 Groin Right   05/12/18    1921     499   Incision (Closed) 05/01/19 Perineum Other (Comment)    05/01/19    0718     145   Incision (Closed) 06/11/19 Perineum   06/11/19    1759     104   Incision (Closed) 06/27/19 Perineum Other (Comment)   06/27/19    1019     88   Wound / Incision (Open or Dehisced) 05/12/18 Incision - Open;Other (Comment) Groin Left   05/12/18    2208    Groin   499          Intake/Output Last 24 hours No intake or output data in the 24 hours ending 09/23/19 0502  Labs/Imaging Results for orders placed or performed during the hospital encounter of 09/22/19 (from the past 48 hour(s))  Basic metabolic panel     Status: Abnormal   Collection Time: 09/22/19  3:50 PM  Result Value Ref Range   Sodium 133 (L) 135 - 145 mmol/L   Potassium 3.7 3.5 - 5.1 mmol/L   Chloride 94 (L) 98 - 111 mmol/L   CO2 25 22 - 32 mmol/L   Glucose, Bld 256 (H) 70 - 99 mg/dL   BUN 14 6 - 20 mg/dL   Creatinine, Ser 1.84 (H) 0.44 - 1.00 mg/dL   Calcium 10.2 8.9 - 10.3 mg/dL   GFR calc non Af Amer 33 (L) >60 mL/min   GFR calc Af Amer 38 (L) >60 mL/min   Anion gap 14 5 - 15    Comment: Performed at [facility], [address], [place], [place] [address]  CBC     Status: Abnormal   Collection Time: 09/22/19  3:50 PM  Result Value Ref Range   WBC 22.5 (H) 4.0 - 10.5 K/uL   RBC 5.42 (H) 3.87 - 5.11 MIL/uL   Hemoglobin 16.2 (H) 12.0 - 15.0 g/dL   HCT 50.4 (H) 36.0 - 46.0 %   MCV 93.0 80.0 - 100.0 fL   MCH 29.9 26.0 - 34.0 pg   MCHC 32.1 30.0 - 36.0 g/dL   RDW 13.2 11.5 - 15.5 %   Platelets 408 (H) 150 - 400 K/uL   nRBC 0.0 0.0 - 0.2 %    Comment: Performed at [facility], [place] [address], [place],  [address]  Troponin I (High Sensitivity)     Status: None   Collection Time: 09/22/19  3:50 PM  Result Value Ref Range   Troponin I (High Sensitivity) 8 <18 ng/L    Comment: (NOTE) Elevated high sensitivity troponin I (hsTnI) values and significant  changes across serial measurements may suggest ACS but many other  chronic and  acute conditions are known to elevate  hsTnI results.  Refer to the Links section for chest pain algorithms and additional  guidance. Performed at [facility], [place] [address] [place], [place] [address]   Hepatic function panel     Status: Abnormal   Collection Time: 09/22/19  3:50 PM  Result Value Ref Range   Total Protein 8.2 (H) 6.5 - 8.1 g/dL   Albumin 4.2 3.5 - 5.0 g/dL   AST 14 (L) 15 - 41 U/L   ALT 15 0 - 44 U/L   Alkaline Phosphatase 93 38 - 126 U/L   Total Bilirubin 0.6 0.3 - 1.2 mg/dL   Bilirubin, Direct <0.1 0.0 - 0.2 mg/dL   Indirect Bilirubin NOT CALCULATED 0.3 - 0.9 mg/dL    Comment: Performed at [facility] [address] [place], [place] [address]  Protime-INR     Status: None   Collection Time: 09/22/19  3:50 PM  Result Value Ref Range   Prothrombin Time 12.3 11.4 - 15.2 seconds   INR 0.9 0.8 - 1.2    Comment: (NOTE) INR goal varies based on device and disease states. Performed at [facility], [place] [address], [place], [place] [address]   I-Stat beta hCG blood, ED     Status: None   Collection Time: 09/22/19  3:59 PM  Result Value Ref Range   I-stat hCG, quantitative <5.0 <5 mIU/mL   Comment 3            Comment:   GEST. AGE      CONC.  (mIU/mL)   <=1 WEEK        5 - 50     2 WEEKS       50 - 500     3 WEEKS       100 - 10,000     4 WEEKS     1,000 - 30,000        FEMALE AND NON-PREGNANT FEMALE:     LESS THAN 5 mIU/mL   POC occult blood, ED Provider will collect     Status: None   Collection Time: 09/22/19  4:09 PM  Result Value Ref Range   Fecal Occult Bld NEGATIVE NEGATIVE  Troponin I (High Sensitivity)     Status: None   Collection Time: 09/22/19  5:30 PM  Result Value Ref Range   Troponin I (High Sensitivity) 10 <18 ng/L    Comment: (NOTE) Elevated high sensitivity troponin I (hsTnI) values and significant  changes across serial measurements may suggest ACS but many other  chronic and acute conditions are known to elevate hsTnI results.  Refer to the "Links"  section for chest pain algorithms and additional  guidance. Performed at [facility], [place] [address], [place],  [address]   Urine rapid drug screen (hosp performed)not at [facility]     Status: Abnormal   Collection Time: 09/22/19  9:35 PM  Result Value Ref Range   Opiates POSITIVE (A) NONE DETECTED   Cocaine POSITIVE (A) NONE DETECTED   Benzodiazepines NONE DETECTED NONE DETECTED   Amphetamines NONE DETECTED NONE DETECTED   Tetrahydrocannabinol POSITIVE (A) NONE DETECTED   Barbiturates NONE DETECTED NONE DETECTED    Comment: (NOTE) DRUG SCREEN FOR MEDICAL PURPOSES ONLY.  IF CONFIRMATION IS NEEDED FOR ANY PURPOSE, NOTIFY LAB WITHIN 5 DAYS. LOWEST DETECTABLE LIMITS FOR URINE DRUG SCREEN Drug Class  Cutoff (ng/mL) Amphetamine and metabolites    1000 Barbiturate and metabolites    200 Benzodiazepine                 [id number] Tricyclics and metabolites     300 Opiates and metabolites        300 Cocaine and metabolites        300 THC                            50 Performed at [facility], [place] [address], [place], [place] [address]   Urinalysis, Routine w reflex microscopic     Status: Abnormal   Collection Time: 09/22/19  9:35 PM  Result Value Ref Range   Color, Urine YELLOW YELLOW   APPearance HAZY (A) CLEAR   Specific Gravity, Urine 1.011 1.005 - 1.030   pH 6.0 5.0 - 8.0   Glucose, UA 150 (A) NEGATIVE mg/dL   Hgb urine dipstick MODERATE (A) NEGATIVE   Bilirubin Urine NEGATIVE NEGATIVE   Ketones, ur 20 (A) NEGATIVE mg/dL   Protein, ur 100 (A) NEGATIVE mg/dL   Nitrite NEGATIVE NEGATIVE   Leukocytes,Ua LARGE (A) NEGATIVE   RBC / HPF 0-5 0 - 5 RBC/hpf   WBC, UA >50 (H) 0 - 5 WBC/hpf   Bacteria, UA MANY (A) NONE SEEN   Squamous Epithelial / LPF 0-5 0 - 5   WBC Clumps PRESENT     Comment: Performed at [facility], [place] [address], [place], [place] [address]  Lactic acid, plasma     Status: None   Collection Time: 09/23/19  2:58 AM  Result  Value Ref Range   Lactic Acid, Venous 1.7 0.5 - 1.9 mmol/L    Comment: Performed at [facility] [address] [place], [place] [address]   CT ABDOMEN PELVIS WO CONTRAST  Result Date: 09/22/2019 CLINICAL DATA:  Chest pain. Abdominal pain. EXAM: CT ABDOMEN AND PELVIS WITHOUT CONTRAST TECHNIQUE: Multidetector CT imaging of the abdomen and pelvis was performed following the standard protocol without IV contrast. COMPARISON:  06/11/2019 FINDINGS: Lower chest: The lung bases are clear. The heart size is normal. Hepatobiliary: The liver is normal. The gallbladder is distended without definite evidence for acute cholecystitis.There is no biliary ductal dilation. Pancreas: Normal contours without ductal dilatation. No peripancreatic fluid collection. Spleen: No splenic laceration or hematoma. Adrenals/Urinary Tract: --Adrenal glands: There is a stable left adrenal adenoma. --Right kidney/ureter: There are multiple nonobstructing stones in the lower pole the right kidney. There is minimal right-sided collecting system dilatation without evidence for an obstructing stone. The previously noted right ureteral stone has resolved. --Left kidney/ureter: The left kidney is somewhat atrophic in comparison to the right. Multiple nonobstructing stones are noted. There are several calcifications in the cortex. Pockets of gas are again noted and are relatively similar to prior study. There is no hydronephrosis. --Urinary bladder: Small amount of gas is noted within the nondependent portion of the urinary bladder. Stomach/Bowel: --Stomach/Duodenum: No hiatal hernia or other gastric abnormality. Normal duodenal course and caliber. --Small bowel: No dilatation or inflammation. --Colon: No focal abnormality. --Appendix: Normal. Vascular/Lymphatic: Atherosclerotic calcification is present within the non-aneurysmal abdominal aorta, without hemodynamically significant stenosis. A left common iliac artery stent noted. --No  retroperitoneal lymphadenopathy. --No mesenteric lymphadenopathy. --No pelvic or inguinal lymphadenopathy. Reproductive: Unremarkable Other: No ascites or free air. There are postsurgical changes of the left inguinal region. Musculoskeletal. No acute displaced  fractures. IMPRESSION: 1. No CT evidence for acute intra-abdominal or pelvic pathology. 2. Interval resolution of the previously noted right ureteral stone. 3. Bilateral nonobstructive nephrolithiasis. 4. Small amount of gas within the nondependent portion of the urinary bladder. Correlate with recent instrumentation. Again noted is gas within the left kidney, similar to prior study. This too is favored to be secondary to prior instrumentation. A gas-forming organism is not entirely excluded and should be correlated with urinalysis. 5. Distended mild distention of the gallbladder without CT evidence for acute cholecystitis. Aortic Atherosclerosis (ICD10-I70.0). Electronically Signed   By: Constance Holster M.D.   On: 09/22/2019 19:22   DG Chest 2 View  Result Date: 09/22/2019 CLINICAL DATA:  Chest pain beginning yesterday. Vomiting. Hematemesis. EXAM: CHEST - 2 VIEW COMPARISON:  06/26/2018 FINDINGS: The heart size and mediastinal contours are within normal limits. Both lungs are clear. The visualized skeletal structures are unremarkable. IMPRESSION: Negative.  No active cardiopulmonary disease. Electronically Signed   By: Marlaine Hind M.D.   On: 09/22/2019 17:32   NM Pulmonary Perfusion  Result Date: 09/23/2019 CLINICAL DATA:  Chest pain. EXAM: NUCLEAR MEDICINE PERFUSION LUNG SCAN TECHNIQUE: Perfusion images were obtained in multiple projections after intravenous injection of radiopharmaceutical. Ventilation scans intentionally deferred if perfusion scan and chest x-ray adequate for interpretation during COVID 19 epidemic. RADIOPHARMACEUTICALS:  1.48 mCi Tc-31m MAA IV COMPARISON:  Chest radiograph 8 hours ago FINDINGS: Homogeneous perfusion to both  lungs.  No focal perfusion defect. IMPRESSION: No evidence of pulmonary embolus.  Normal perfusion scan. Electronically Signed   By: Keith Rake M.D.   On: 09/23/2019 01:59   US Abdomen Limited  Result Date: 09/22/2019 CLINICAL DATA:  Right upper quadrant pain for 3 days. EXAM: ULTRASOUND ABDOMEN LIMITED RIGHT UPPER QUADRANT COMPARISON:  None. FINDINGS: Technically difficult exam due to patient habitus. Gallbladder: No gallstones or wall thickening visualized. No sonographic Murphy sign noted by sonographer. Common bile duct: Diameter: 3 mm, within normal limits. Liver: No focal lesion identified. Within normal limits in parenchymal echogenicity. Portal vein is patent on color Doppler imaging with normal direction of blood flow towards the liver. Other: None. IMPRESSION: Negative.  No hepatobiliary abnormality identified. Electronically Signed   By: Marlaine Hind M.D.   On: 09/22/2019 17:09    Pending Labs Unresulted Labs (From admission, onward)    Start     Ordered   09/24/19 0500  Protime-INR  Daily,   R     09/23/19 0414   09/23/19 0500  HIV Antibody (routine testing w rflx)  (HIV Antibody (Routine testing w reflex) panel)  Tomorrow morning,   R     09/23/19 0454   09/23/19 0500  Comprehensive metabolic panel  Tomorrow morning,   R     09/23/19 0454   09/23/19 0500  CBC WITH DIFFERENTIAL  Tomorrow morning,   R     09/23/19 0454   09/23/19 0500  Magnesium  Tomorrow morning,   R     09/23/19 0454   09/23/19 0455  Hemoglobin A1c  Once,   STAT    Comments: To assess prior glycemic control    09/23/19 0454   09/23/19 0230  SARS CORONAVIRUS 2 (TAT 6-24 HRS) Nasopharyngeal Nasopharyngeal Swab  (Tier 3 (TAT 6-24 hrs))  Once,   STAT    Question Answer Comment  Is this test for diagnosis or screening Screening   Symptomatic for COVID-19 as defined by CDC No   Hospitalized for COVID-19 No   Admitted to ICU  for COVID-19 No   Previously tested for COVID-19 Yes   Resident in a congregate  (group) care setting No   Employed in healthcare setting No   Pregnant No      09/23/19 0229   09/23/19 0229  Lactic acid, plasma  Now then every 2 hours,   STAT     09/23/19 0229   09/23/19 0229  Culture, blood (routine x 2)  BLOOD CULTURE X 2,   STAT     09/23/19 0229   09/23/19 0229  Urine culture  ONCE - STAT,   STAT     09/23/19 0229          Vitals/Pain Today's Vitals   09/23/19 0203 09/23/19 0230 09/23/19 0309 09/23/19 0400  BP: (!) 146/101 (!) 142/86  127/84  Pulse: (!) 121 (!) 115  (!) 116  Resp: (!) 24 15    Temp:      TempSrc:      SpO2: 100% 97%  94%  Weight:      Height:      PainSc: 10-Worst pain ever  8      Isolation Precautions No active isolations  Medications Medications  pantoprazole (PROTONIX) EC tablet 40 mg (has no administration in time range)  aspirin EC tablet 81 mg (has no administration in time range)  insulin glargine (LANTUS) injection 20 Units (has no administration in time range)  insulin aspart (novoLOG) injection 0-9 Units (has no administration in time range)  sodium chloride flush (NS) 0.9 % injection 3 mL (has no administration in time range)  0.9 %  sodium chloride infusion (has no administration in time range)  acetaminophen (TYLENOL) tablet 650 mg (has no administration in time range)    Or  acetaminophen (TYLENOL) suppository 650 mg (has no administration in time range)  HYDROcodone-acetaminophen (NORCO/VICODIN) 5-325 MG per tablet 1-2 tablet (has no administration in time range)  senna-docusate (Senokot-S) tablet 1 tablet (has no administration in time range)  cefTRIAXone (ROCEPHIN) 1 g in sodium chloride 0.9 % 100 mL IVPB (has no administration in time range)  enoxaparin (LOVENOX) injection 110 mg (has no administration in time range)  Warfarin - Pharmacist Dosing Inpatient (has no administration in time range)  warfarin (COUMADIN) tablet 10 mg (has no administration in time range)  promethazine (PHENERGAN) injection 12.5 mg  (12.5 mg Intravenous Given 09/22/19 1620)  sodium chloride 0.9 % bolus 1,000 mL (0 mLs Intravenous Stopped 09/22/19 2135)  alum & mag hydroxide-simeth (MAALOX/MYLANTA) 200-200-20 MG/5ML suspension 30 mL (30 mLs Oral Given 09/22/19 1623)    And  lidocaine (XYLOCAINE) 2 % viscous mouth solution 15 mL (15 mLs Oral Given 09/22/19 1623)  morphine 4 MG/ML injection 6 mg (6 mg Intravenous Given 09/22/19 1631)  sodium chloride 0.9 % bolus 1,000 mL (0 mLs Intravenous Stopped 09/23/19 0042)  cefTRIAXone (ROCEPHIN) 1 g in sodium chloride 0.9 % 100 mL IVPB (0 g Intravenous Stopped 09/23/19 0026)  technetium albumin aggregated (MAA) injection solution 0000000 millicurie (0000000 millicuries Intravenous Contrast Given 09/23/19 0130)  morphine 4 MG/ML injection 6 mg (6 mg Intravenous Given 09/23/19 0204)  sodium chloride 0.9 % bolus 1,000 mL (0 mLs Intravenous Stopped 09/23/19 0429)    Mobility walks Moderate fall risk   Focused Assessments    R Recommendations: See Admitting Provider Note  Report given to:   Additional Notes:

## 2019-09-23 NOTE — Progress Notes (Signed)
ANTICOAGULATION CONSULT NOTE - Initial Consult  Pharmacy Consult for Coumadin Indication: h/o iliac thrombosis  Allergies  Allergen Reactions  . Ciprofloxacin Hcl Hives  . Macrobid [Nitrofurantoin Macrocrystal] Hives, Shortness Of Breath and Rash  . Other Hives, Shortness Of Breath and Rash    NO "-CILLINS"!!!  . Penicillins Shortness Of Breath    Had had cephalosporins without incident Has patient had a PCN reaction causing immediate rash, facial/tongue/throat swelling, SOB or lightheadedness with hypotension: Yes Has patient had a PCN reaction causing severe rash involving mucus membranes or skin necrosis: Unk Has patient had a PCN reaction that required hospitalization: Unk Has patient had a PCN reaction occurring within the last 10 years: No If all of the above answers are "NO", then may proceed with Cephalosporin use.   . Sulfa Antibiotics Hives, Shortness Of Breath and Rash  . Dilaudid [Hydromorphone Hcl] Other (See Comments)    Asystole per pt report  . Lexapro [Escitalopram Oxalate] Other (See Comments)    "I just did not like it."    Patient Measurements: Height: '5\' 7"'$  (170.2 cm) Weight: 252 lb (114.3 kg) IBW/kg (Calculated) : 61.6 Heparin Dosing Weight: 90 kg  Vital Signs: BP: 142/86 (12/20 0230) Pulse Rate: 115 (12/20 0230)  Labs: Recent Labs    09/22/19 1550 09/22/19 1730  HGB 16.2*  --   HCT 50.4*  --   PLT 408*  --   LABPROT 12.3  --   INR 0.9  --   CREATININE 1.84*  --   TROPONINIHS 8 10    Estimated Creatinine Clearance: 50.9 mL/min (A) (by C-G formula based on SCr of 1.84 mg/dL (H)).   Medical History: Past Medical History:  Diagnosis Date  . Anxiety   . Depression   . Diabetes ([place])    Type 2  . GERD (gastroesophageal reflux disease)   . History of blood clots    ,DVT-left leg, and early 2000, had blood clot behind left breast  . History of kidney stones   . Hypercalcemia   . Hyperparathyroidism   . Hypertension    had been on  Lisinopril and PCP took her off it 4 months ago  . Iron deficiency anemia   . Left thyroid nodule   . Mass of right ovary   . Nephrolithiasis   . 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 Peripheral vascular disease ([place])   . Prolonged Q-T interval on ECG 04/19/2019  . Renal calculi   . Substance abuse ([place])   . Tooth loose    top tooth from the right  is loose     Medications:  No current facility-administered medications on file prior to encounter.   Current Outpatient Medications on File Prior to Encounter  Medication Sig Dispense Refill  . albuterol (PROAIR HFA) 108 (90 Base) MCG/ACT inhaler Inhale 2 puffs into the lungs every 6 (six) hours as needed for wheezing or shortness of breath. 18 g 0  . amLODipine (NORVASC) 5 MG tablet Take 1 tablet (5 mg total) by mouth daily. 30 tablet 3  . aspirin EC 81 MG tablet Take 81 mg by mouth daily.    . blood glucose meter kit and supplies Dispense based on patient and insurance preference. Use up to four times daily as directed. (FOR ICD-9 250.00, 250.01). 1 each 0  . Blood Glucose Monitoring Suppl (TRUE METRIX METER) w/Device KIT Use as directed 1 kit 0  . buPROPion (WELLBUTRIN SR) 150 MG 12 hr tablet Take 1 tablet (  150 mg total) by mouth 2 (two) times daily. 60 tablet 3  . ferrous sulfate 325 (65 FE) MG tablet Take 1 tablet (325 mg total) by mouth daily with breakfast. 591 tablet 1  . folic acid (FOLVITE) 1 MG tablet Take 1 tablet (1 mg total) by mouth daily. 30 tablet 2  . gabapentin (NEURONTIN) 400 MG capsule Take 1 capsule (400 mg total) by mouth 3 (three) times daily. 90 capsule 4  . glucose blood (TRUE METRIX BLOOD GLUCOSE TEST) test strip Use as instructed 100 each 12  . glucose blood test strip Use as instructed 100 each 12  . hydrOXYzine (ATARAX/VISTARIL) 50 MG tablet Take 1 tablet (50 mg total) by mouth 3 (three) times daily as needed for itching or anxiety. 90 tablet 3  . insulin aspart (NOVOLOG) 100 UNIT/ML injection  Inject 5-15 Units into the skin 3 (three) times daily before meals.    . insulin glargine (LANTUS) 100 UNIT/ML injection Inject 73 Units into the skin at bedtime.    . Insulin Pen Needle 31G X 5 MM MISC Use with Lantus and Novolog injections. 100 each 11  . Insulin Syringe-Needle U-100 (BD INSULIN SYRINGE ULTRAFINE) 31G X 5/16" 0.5 ML MISC Use as directed 100 each 3  . lip balm (CARMEX) ointment Apply topically as needed for lip care. 7 g 0  . loratadine (CLARITIN) 10 MG tablet Take 1 tablet (10 mg total) by mouth daily as needed for allergies. 30 tablet 0  . menthol-cetylpyridinium (CEPACOL) 3 MG lozenge Take 1 lozenge (3 mg total) by mouth as needed for sore throat. 100 tablet 12  . nicotine (NICODERM CQ - DOSED IN MG/24 HOURS) 14 mg/24hr patch Place 1 patch (14 mg total) onto the skin daily. (Patient not taking: Reported on 08/23/2019) 28 patch 0  . nitroGLYCERIN (NITROSTAT) 0.4 MG SL tablet Place 1 tablet (0.4 mg total) under the tongue every 5 (five) minutes as needed for chest pain. 50 tablet 3  . omeprazole (PRILOSEC) 20 MG capsule Take 1 capsule (20 mg total) by mouth 2 (two) times daily before a meal. 60 capsule 4  . polyethylene glycol (MIRALAX / GLYCOLAX) 17 g packet Take 17 g by mouth 2 (two) times daily. 14 each 0  . senna-docusate (SENOKOT-S) 8.6-50 MG tablet Take 1 tablet by mouth 2 (two) times daily. 60 tablet 0  . sertraline (ZOLOFT) 50 MG tablet Take 1 tablet (50 mg total) by mouth daily. 1/2 tab daily x 2 wks then 1 tab PO daily 30 tablet 3  . sodium chloride (OCEAN) 0.65 % SOLN nasal spray Place 1 spray into both nostrils as needed for congestion (nose irritation). 30 mL 0  . TRUEPLUS LANCETS 28G MISC Use as directed 100 each 6  . vitamin B-12 (CYANOCOBALAMIN) 1000 MCG tablet Take 2 tablets (2,000 mcg total) by mouth daily. 60 tablet 3  . warfarin (COUMADIN) 5 MG tablet TAKE 2 TABLETS ON MONDAY, WEDNESDAY AND FRIDAY WITH 1 TABLET ALL OTHER DAYS. (Patient taking differently: Take  5-10 mg by mouth See admin instructions. Take 10 Monday, Wednesday and Friday and 5 mg on Tuesday, Thursday, Saturday and Sunday) 60 tablet 3     Assessment: 43 y.o. female admitted with chest pain, h/o iliac thrombosis and LLE thrombectomy, INR subtherapeutic, for anticoagulation.  Lovenox bridge until INR > 2.  Goal of Therapy:  INR 2-3 Monitor platelets by anticoagulation protocol: Yes   Plan:  Coumadin 10 mg today F/U daily INR  Terri Sparks, [place] Curb 09/23/2019,3:59  AM

## 2019-09-23 NOTE — Progress Notes (Signed)
  Echocardiogram 2D Echocardiogram has been performed.  Terri Sparks 09/23/2019, 5:27 PM

## 2019-09-23 NOTE — H&P (Signed)
History and Physical    Terri Sparks AUQ:[id number] DOB: 1976/02/24 DOA: 09/22/2019  PCP: Ladell Pier, MD   Patient coming from: Home   Chief Complaint: Abdominal pain, chest pain, N/V   HPI: Terri Sparks is a 43 y.o. 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  She has been out of her medications for 2 months due to financial constraints.  Patient developed abdominal pain roughly 3 days ago, describing both epigastric pain and right flank pain.  She also has chronic left flank pain that has been unchanged.  This has been associated with nausea and vomiting.  She reports seeing some dark vomitus.  She denies diarrhea, melena, or hematochezia.  She also denies fevers, chills, or cough.  She reports some chest pain for the past day or so that she has been attributing to the vomiting.  She reports chronic left lower leg pain that is unchanged.  ED Course: Upon arrival to the ED, patient is found to be afebrile, saturating well on room air, tachypneic, tachycardic, and with stable blood pressure.  EKG features sinus tachycardia with rate 112 and PVCs.  Chest x-ray is negative for acute cardiopulmonary disease.  CT the abdomen and pelvis is negative for acute intra-abdominal or pelvic abnormality.  Right upper quadrant ultrasound is negative.  VQ scan is within normal limits.  High-sensitivity troponin is normal x2.  INR 0.9.  UDS positive for opiates, cocaine, and THC.  Chemistry panel notable for glucose 256 and creatinine 1.84, similar to priors.  CBC features a leukocytosis to 22,500, slight polycythemia, and platelets 408,000.  Urinalysis concerning for UTI.  Fecal occult blood  testing negative.  Blood and urine cultures were collected, 2 L normal saline administered, and patient was started on empiric Rocephin.  COVID-19 screening test has not yet resulted.  Hospitalist asked to admit.  Review of Systems:  All other systems reviewed and apart from HPI, are negative.  Past Medical History:  Diagnosis Date  . Anxiety   . Depression   . Diabetes ([place])    Type 2  . GERD (gastroesophageal reflux disease)   . History of blood clots    ,DVT-left leg, and early 2000, had blood clot behind left breast  . History of kidney stones   . Hypercalcemia   . Hyperparathyroidism   . Hypertension    had been on Lisinopril and PCP took her off it 4 months ago  . Iron deficiency anemia   . Left thyroid nodule   . Mass of right ovary   . Nephrolithiasis   . PAOD (peripheral arterial occlusive disease) ([place])   . Peripheral vascular disease ([place])   . Prolonged Q-T interval on ECG 04/19/2019  . Renal calculi   . Substance abuse ([place])   . Tooth loose    top tooth from the right  is loose     Past Surgical History:  Procedure Laterality Date  . ABDOMINAL AORTOGRAM W/LOWER EXTREMITY N/A 11/14/2017   Procedure: ABDOMINAL AORTOGRAM W/LOWER EXTREMITY;  Surgeon: Waynetta Sandy, MD;  Location: [facility];  Service: Cardiovascular;  Laterality: N/A;  . ANGIOPLASTY ILLIAC ARTERY Left 02/25/2018   Procedure: BALLOON ANGIOPLASTY LEFT ILIAC ARTERY;  Surgeon: Conrad Vancleave, MD;  Location: [facility];  Service: Vascular;  Laterality:  Left;  . APPLICATION OF WOUND VAC Left 03/03/2018   Procedure: APPLICATION OF WOUND VAC;  Surgeon: Angelia Mould, MD;  Location: [place];  Service: Vascular;  Laterality: Left;  . CYSTOSCOPY W/ URETERAL STENT PLACEMENT Right 06/27/2018   Procedure: CYSTOSCOPY WITH RETROGRADE PYELOGRAM/URETERAL DOUBLE J STENT PLACEMENT;  Surgeon: Ceasar Mons, MD;  Location: [place];  Service: Urology;  Laterality: Right;  . CYSTOSCOPY WITH STENT  PLACEMENT N/A 02/23/2019   Procedure: CYSTOSCOPY WITH RIGHT URETERAL STENT EXCHANGE/ LEFT URETERAL STENT PLACEMENT;  Surgeon: Ceasar Mons, MD;  Location: [facility];  Service: Urology;  Laterality: N/A;  . CYSTOSCOPY WITH STENT PLACEMENT Right 06/11/2019   Procedure: CYSTOSCOPY,RIGHT RETROGRADE WITH STENT PLACEMENT;  Surgeon: Irine Seal, MD;  Location: [facility];  Service: Urology;  Laterality: Right;  . CYSTOSCOPY/RETROGRADE/URETEROSCOPY/STONE EXTRACTION WITH BASKET Bilateral 05/01/2019   Procedure: CYSTOSCOPY/URETEROSCOPY/STONE EXTRACTION / LASER LITHOTRIPSY, BILATERAL URETEROSCOPY WITH URETERAL STONE EXTRACTION AND STENT EXCHANGE;  Surgeon: Ceasar Mons, MD;  Location: [facility];  Service: Urology;  Laterality: Bilateral;  . CYSTOSCOPY/URETEROSCOPY/HOLMIUM LASER/STENT PLACEMENT Right 06/27/2019   Procedure: CYSTOSCOPY/URETEROSCOPY/HOLMIUM LASER/STENT EXCHANGE;  Surgeon: Ceasar Mons, MD;  Location: [facility];  Service: Urology;  Laterality: Right;  . EMBOLECTOMY Left 02/24/2017   Procedure: Left Lower Extremity Embolectomy and Angiogram.;  Surgeon: Waynetta Sandy, MD;  Location: [place];  Service: Vascular;  Laterality: Left;  . INSERTION OF ILIAC STENT  02/24/2017   Procedure: INSERTION OF Common ILIAC STENT;  Surgeon: Waynetta Sandy, MD;  Location: [place];  Service: Vascular;;  . INTRAOPERATIVE ARTERIOGRAM Left 02/25/2018   Procedure: INTRA OPERATIVE ARTERIOGRAM WITH LEFT LEG RUNOFF;  Surgeon: Conrad Valdosta, MD;  Location: [place];  Service: Vascular;  Laterality: Left;  . LOWER EXTREMITY ANGIOGRAM Left 02/24/2017   Procedure: Aortagram, Left lower extremity Run-off;  Surgeon: Waynetta Sandy, MD;  Location: [place];  Service: Vascular;  Laterality: Left;  . PATCH ANGIOPLASTY Left 02/25/2018   Procedure: PATCH ANGIOPLASTY LEFT SUPERFICIAL FEMORAL ARTERY WITH BOVINE PATCH;  Surgeon: Conrad Maloy, MD;  Location: [place];  Service:  Vascular;  Laterality: Left;  . PERCUTANEOUS VENOUS THROMBECTOMY,LYSIS WITH INTRAVASCULAR ULTRASOUND (IVUS) Left 05/12/2018   Procedure: MECHANICAL THROMBECTOMY LEFT LEG, BALLOON ANGIOPLASTY LEFT ANTERIOR TIBIAL ARTERY, AORTOGRAM WITH LEFT LEG RUNOFF;  Surgeon: Serafina Mitchell, MD;  Location: [facility];  Service: Vascular;  Laterality: Left;  . 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 THROMBECTOMY FEMORAL ARTERY Left 02/25/2018   Procedure: LEFT ILIAC AND POPLITEAL ARTERY THROMBECTOMY;  Surgeon: Conrad Westboro, MD;  Location: [place];  Service: Vascular;  Laterality: Left;  . TUBAL LIGATION    . WOUND DEBRIDEMENT Left 03/03/2018   Procedure: DEBRIDEMENT WOUND;  Surgeon: Angelia Mould, MD;  Location: [place];  Service: Vascular;  Laterality: Left;  . WOUND EXPLORATION Left 03/03/2018   Procedure: WOUND EXPLORATION;  Surgeon: Angelia Mould, MD;  Location: [place];  Service: Vascular;  Laterality: Left;     reports that she has been smoking cigarettes. She has a 3.75 pack-year smoking history. She has never used smokeless tobacco. She reports current alcohol use. She reports current drug use. Drugs: Marijuana and Cocaine.  Allergies  Allergen Reactions  . Ciprofloxacin Hcl Hives  . Macrobid [Nitrofurantoin Macrocrystal] Hives, Shortness Of Breath and Rash  . Other Hives, Shortness Of Breath and Rash    NO "-CILLINS"!!!  . Penicillins Shortness Of Breath    Had had cephalosporins without incident Has patient  had a PCN reaction causing immediate rash, facial/tongue/throat swelling, SOB or lightheadedness with hypotension: Yes Has patient had a PCN reaction causing severe rash involving mucus membranes or skin necrosis: Unk Has patient had a PCN reaction that required hospitalization: Unk Has patient had a PCN reaction occurring within the last 10 years: No If all of the above answers are "NO", then may proceed with Cephalosporin use.   . Sulfa Antibiotics Hives, Shortness Of Breath and Rash   . Dilaudid [Hydromorphone Hcl] Other (See Comments)    Asystole per pt report  . Lexapro [Escitalopram Oxalate] Other (See Comments)    "I just did not like it."    Family History  Problem Relation Age of Onset  . Depression Mother   . Diabetes Father   . Heart failure Father      Prior to Admission medications   Medication Sig Start Date End Date Taking? Authorizing Provider  albuterol (PROAIR HFA) 108 (90 Base) MCG/ACT inhaler Inhale 2 puffs into the lungs every 6 (six) hours as needed for wheezing or shortness of breath. 12/18/18   Ladell Pier, MD  amLODipine (NORVASC) 5 MG tablet Take 1 tablet (5 mg total) by mouth daily. 07/30/19   Ladell Pier, MD  aspirin EC 81 MG tablet Take 81 mg by mouth daily.    [provider]  blood glucose meter kit and supplies Dispense based on patient and insurance preference. Use up to four times daily as directed. (FOR ICD-9 250.00, 250.01). 03/03/17   Cristal Ford, DO  Blood Glucose Monitoring Suppl (TRUE METRIX METER) w/Device KIT Use as directed 08/01/17   Ladell Pier, MD  buPROPion [facility] SR) 150 MG 12 hr tablet Take 1 tablet (150 mg total) by mouth 2 (two) times daily. 07/30/19   Ladell Pier, MD  ferrous sulfate 325 (65 FE) MG tablet Take 1 tablet (325 mg total) by mouth daily with breakfast. 01/31/18   Ladell Pier, MD  folic acid (FOLVITE) 1 MG tablet Take 1 tablet (1 mg total) by mouth daily. 07/30/19   Ladell Pier, MD  gabapentin (NEURONTIN) 400 MG capsule Take 1 capsule (400 mg total) by mouth 3 (three) times daily. 07/30/19   Ladell Pier, MD  glucose blood (TRUE METRIX BLOOD GLUCOSE TEST) test strip Use as instructed 07/30/19   Ladell Pier, MD  glucose blood test strip Use as instructed 10/23/18   Ladell Pier, MD  hydrOXYzine (ATARAX/VISTARIL) 50 MG tablet Take 1 tablet (50 mg total) by mouth 3 (three) times daily as needed for itching or anxiety. 07/30/19   Ladell Pier, MD  insulin aspart (NOVOLOG) 100 UNIT/ML injection Inject 5-15 Units into the skin 3 (three) times daily before meals.    [provider]  insulin glargine (LANTUS) 100 UNIT/ML injection Inject 73 Units into the skin at bedtime.    [provider]  Insulin Pen Needle 31G X 5 MM MISC Use with Lantus and Novolog injections. 04/19/19   Ladell Pier, MD  Insulin Syringe-Needle U-100 (BD INSULIN SYRINGE ULTRAFINE) 31G X 5/16" 0.5 ML MISC Use as directed 04/28/17   Ladell Pier, MD  lip balm (CARMEX) ointment Apply topically as needed for lip care. 06/16/19   Jani Gravel, MD  loratadine (CLARITIN) 10 MG tablet Take 1 tablet (10 mg total) by mouth daily as needed for allergies. 06/16/19   Jani Gravel, MD  menthol-cetylpyridinium (CEPACOL) 3 MG lozenge Take 1 lozenge (3 mg total)  by mouth as needed for sore throat. 06/16/19   Jani Gravel, MD  nicotine (NICODERM CQ - DOSED IN MG/24 HOURS) 14 mg/24hr patch Place 1 patch (14 mg total) onto the skin daily. Patient not taking: Reported on 08/23/2019 06/16/19   Jani Gravel, MD  nitroGLYCERIN (NITROSTAT) 0.4 MG SL tablet Place 1 tablet (0.4 mg total) under the tongue every 5 (five) minutes as needed for chest pain. 08/16/19   Ladell Pier, MD  omeprazole (PRILOSEC) 20 MG capsule Take 1 capsule (20 mg total) by mouth 2 (two) times daily before a meal. 07/30/19   Ladell Pier, MD  polyethylene glycol (MIRALAX / GLYCOLAX) 17 g packet Take 17 g by mouth 2 (two) times daily. 06/16/19   Jani Gravel, MD  senna-docusate (SENOKOT-S) 8.6-50 MG tablet Take 1 tablet by mouth 2 (two) times daily. 06/16/19   Jani Gravel, MD  sertraline (ZOLOFT) 50 MG tablet Take 1 tablet (50 mg total) by mouth daily. 1/2 tab daily x 2 wks then 1 tab PO daily 07/30/19   Ladell Pier, MD  sodium chloride (OCEAN) 0.65 % SOLN nasal spray Place 1 spray into both nostrils as needed for congestion (nose irritation). 06/16/19   Jani Gravel, MD  TRUEPLUS LANCETS  28G MISC Use as directed 08/01/17   Ladell Pier, MD  vitamin B-12 (CYANOCOBALAMIN) 1000 MCG tablet Take 2 tablets (2,000 mcg total) by mouth daily. 07/30/19   Ladell Pier, MD  warfarin (COUMADIN) 5 MG tablet TAKE 2 TABLETS ON MONDAY, WEDNESDAY AND FRIDAY WITH 1 TABLET ALL OTHER DAYS. Patient taking differently: Take 5-10 mg by mouth See admin instructions. Take 10 Monday, Wednesday and Friday and 5 mg on Tuesday, Thursday, Saturday and Sunday 04/19/19   Ladell Pier, MD    Physical Exam: Vitals:   09/23/19 0030 09/23/19 0107 09/23/19 0203 09/23/19 0230  BP: 138/88  (!) 146/101 (!) 142/86  Pulse: (!) 121 (!) 118 (!) 121 (!) 115  Resp: (!) 23 19 (!) 24 15  Temp:      TempSrc:      SpO2: 93% 98% 100% 97%  Weight:      Height:        Constitutional: NAD, calm  Eyes: PERTLA, lids and conjunctivae normal ENMT: Mucous membranes are moist. Posterior pharynx clear of any exudate or lesions.   Neck: normal, supple, no masses, no thyromegaly Respiratory: no wheezing, no crackles. Normal respiratory effort. No accessory muscle use.  Cardiovascular: Rate ~120 and regular. No extremity edema.  Abdomen: No distension, no tenderness, soft. Bowel sounds active.  Musculoskeletal: no clubbing / cyanosis. No joint deformity upper and lower extremities.  Skin: no significant rashes, lesions, ulcers. Warm, dry, well-perfused. Neurologic: No facial asymmetry. Sensation intact. Moving all extremities.  Psychiatric: Alert and oriented to person, place, and situation. Pleasant, cooperative.     Labs on Admission: I have personally reviewed following labs and imaging studies  CBC: Recent Labs  Lab 09/22/19 1550  WBC 22.5*  HGB 16.2*  HCT 50.4*  MCV 93.0  PLT 127*   Basic Metabolic Panel: Recent Labs  Lab 09/22/19 1550  NA 133*  K 3.7  CL 94*  CO2 25  GLUCOSE 256*  BUN 14  CREATININE 1.84*  CALCIUM 10.2   GFR: Estimated Creatinine Clearance: 50.9 mL/min (A) (by C-G  formula based on SCr of 1.84 mg/dL (H)). Liver Function Tests: Recent Labs  Lab 09/22/19 1550  AST 14*  ALT 15  ALKPHOS 93  BILITOT  0.6  PROT 8.2*  ALBUMIN 4.2   No results for input(s): LIPASE, AMYLASE in the last 168 hours. No results for input(s): AMMONIA in the last 168 hours. Coagulation Profile: Recent Labs  Lab 09/22/19 1550  INR 0.9   Cardiac Enzymes: No results for input(s): CKTOTAL, CKMB, CKMBINDEX, TROPONINI in the last 168 hours. BNP (last 3 results) No results for input(s): PROBNP in the last 8760 hours. HbA1C: No results for input(s): HGBA1C in the last 72 hours. CBG: No results for input(s): GLUCAP in the last 168 hours. Lipid Profile: No results for input(s): CHOL, HDL, LDLCALC, TRIG, CHOLHDL, LDLDIRECT in the last 72 hours. Thyroid Function Tests: No results for input(s): TSH, T4TOTAL, FREET4, T3FREE, THYROIDAB in the last 72 hours. Anemia Panel: No results for input(s): VITAMINB12, FOLATE, FERRITIN, TIBC, IRON, RETICCTPCT in the last 72 hours. Urine analysis:    Component Value Date/Time   COLORURINE YELLOW 09/22/2019 2135   APPEARANCEUR HAZY (A) 09/22/2019 2135   LABSPEC 1.011 09/22/2019 2135   PHURINE 6.0 09/22/2019 2135   GLUCOSEU 150 (A) 09/22/2019 2135   HGBUR MODERATE (A) 09/22/2019 2135   BILIRUBINUR NEGATIVE 09/22/2019 2135   BILIRUBINUR negative 04/19/2019 1206   BILIRUBINUR negative 09/29/2017 1317   KETONESUR 20 (A) 09/22/2019 2135   PROTEINUR 100 (A) 09/22/2019 2135   UROBILINOGEN 0.2 04/19/2019 1206   UROBILINOGEN 0.2 06/15/2018 1530   NITRITE NEGATIVE 09/22/2019 2135   LEUKOCYTESUR LARGE (A) 09/22/2019 2135   Sepsis Labs: _0 (procalcitonin:4,lacticidven:4) )No results found for this or any previous visit (from the past 240 hour(s)).   Radiological Exams on Admission: CT ABDOMEN PELVIS WO CONTRAST  Result Date: 09/22/2019 CLINICAL DATA:  Chest pain. Abdominal pain. EXAM: CT ABDOMEN AND PELVIS WITHOUT CONTRAST  TECHNIQUE: Multidetector CT imaging of the abdomen and pelvis was performed following the standard protocol without IV contrast. COMPARISON:  06/11/2019 FINDINGS: Lower chest: The lung bases are clear. The heart size is normal. Hepatobiliary: The liver is normal. The gallbladder is distended without definite evidence for acute cholecystitis.There is no biliary ductal dilation. Pancreas: Normal contours without ductal dilatation. No peripancreatic fluid collection. Spleen: No splenic laceration or hematoma. Adrenals/Urinary Tract: --Adrenal glands: There is a stable left adrenal adenoma. --Right kidney/ureter: There are multiple nonobstructing stones in the lower pole the right kidney. There is minimal right-sided collecting system dilatation without evidence for an obstructing stone. The previously noted right ureteral stone has resolved. --Left kidney/ureter: The left kidney is somewhat atrophic in comparison to the right. Multiple nonobstructing stones are noted. There are several calcifications in the cortex. Pockets of gas are again noted and are relatively similar to prior study. There is no hydronephrosis. --Urinary bladder: Small amount of gas is noted within the nondependent portion of the urinary bladder. Stomach/Bowel: --Stomach/Duodenum: No hiatal hernia or other gastric abnormality. Normal duodenal course and caliber. --Small bowel: No dilatation or inflammation. --Colon: No focal abnormality. --Appendix: Normal. Vascular/Lymphatic: Atherosclerotic calcification is present within the non-aneurysmal abdominal aorta, without hemodynamically significant stenosis. A left common iliac artery stent noted. --No retroperitoneal lymphadenopathy. --No mesenteric lymphadenopathy. --No pelvic or inguinal lymphadenopathy. Reproductive: Unremarkable Other: No ascites or free air. There are postsurgical changes of the left inguinal region. Musculoskeletal. No acute displaced fractures. IMPRESSION: 1. No CT evidence for  acute intra-abdominal or pelvic pathology. 2. Interval resolution of the previously noted right ureteral stone. 3. Bilateral nonobstructive nephrolithiasis. 4. Small amount of gas within the nondependent portion of the urinary bladder. Correlate with recent instrumentation. Again noted is gas within  the left kidney, similar to prior study. This too is favored to be secondary to prior instrumentation. A gas-forming organism is not entirely excluded and should be correlated with urinalysis. 5. Distended mild distention of the gallbladder without CT evidence for acute cholecystitis. Aortic Atherosclerosis (ICD10-I70.0). Electronically Signed   By: Constance Holster M.D.   On: 09/22/2019 19:22   DG Chest 2 View  Result Date: 09/22/2019 CLINICAL DATA:  Chest pain beginning yesterday. Vomiting. Hematemesis. EXAM: CHEST - 2 VIEW COMPARISON:  06/26/2018 FINDINGS: The heart size and mediastinal contours are within normal limits. Both lungs are clear. The visualized skeletal structures are unremarkable. IMPRESSION: Negative.  No active cardiopulmonary disease. Electronically Signed   By: Marlaine Hind M.D.   On: 09/22/2019 17:32   NM Pulmonary Perfusion  Result Date: 09/23/2019 CLINICAL DATA:  Chest pain. EXAM: NUCLEAR MEDICINE PERFUSION LUNG SCAN TECHNIQUE: Perfusion images were obtained in multiple projections after intravenous injection of radiopharmaceutical. Ventilation scans intentionally deferred if perfusion scan and chest x-ray adequate for interpretation during COVID 19 epidemic. RADIOPHARMACEUTICALS:  1.48 mCi Tc-51mMAA IV COMPARISON:  Chest radiograph 8 hours ago FINDINGS: Homogeneous perfusion to both lungs.  No focal perfusion defect. IMPRESSION: No evidence of pulmonary embolus.  Normal perfusion scan. Electronically Signed   By: MKeith RakeM.D.   On: 09/23/2019 01:59   UKoreaAbdomen Limited  Result Date: 09/22/2019 CLINICAL DATA:  Right upper quadrant pain for 3 days. EXAM: ULTRASOUND  ABDOMEN LIMITED RIGHT UPPER QUADRANT COMPARISON:  None. FINDINGS: Technically difficult exam due to patient habitus. Gallbladder: No gallstones or wall thickening visualized. No sonographic Murphy sign noted by sonographer. Common bile duct: Diameter: 3 mm, within normal limits. Liver: No focal lesion identified. Within normal limits in parenchymal echogenicity. Portal vein is patent on color Doppler imaging with normal direction of blood flow towards the liver. Other: None. IMPRESSION: Negative.  No hepatobiliary abnormality identified. Electronically Signed   By: JMarlaine HindM.D.   On: 09/22/2019 17:09    EKG: Independently reviewed. Sinus tachycardia (rate 112), PVC's.   Assessment/Plan   1. Sepsis secondary to UTI  - Presents with abdominal pain and N/V and is found to have tachycardia, tachypnea, leukocytosis, and ED-workup concerning for recurrent UTI  - Blood and urine cultures collected in ED, 2 liters NS given, lactate pending, and she was started on empiric Rocephin  - Continue Rocephin, follow cultures and clinical course    2. Chest pain  - HS troponin was negative x2 but drawn <2 hrs apart, CXR unremarkable, and V/Q scan a normal study  - Continue cardiac monitoring, check another HS troponin, cocaine avoidance encouraged    3. Insulin-dependent DM  - A1c was 8.3% in September 2020  - Continue Lantus and Novolog, adjust dosing as needed    4. CKD IIIb  - SCr is 1.84 in ED, similar to priors  - Renally-dose medications, monitor    5. Polysubstance abuse  - UDS positive for opiates, cocaine, and THC  - Patient reports she has been trying to abstain but has had some relapses - Encouraged continued attempts at abstinence, SW consulted for possible resources   6. Peripheral arterial occlusive disease; history of DVT - Hx of open and percutaneous interventions on LE arterial thrombi, follows with vascular and managed with warfarin and ASA   - She has been out of medications for  2 months d/t financial constraints and INR is 0.9  - No acute ischemia  - Resume ASA and warfarin, bridge  with Lovenox     DVT prophylaxis: warfarin, Lovenox   Code Status: Full  Family Communication: Discussed with patient  Consults called: None  Admission status: Observation     Vianne Bulls, MD [facility] Pager [phone]  If 7PM-7AM, please contact night-coverage [email or web address] Password [facility]  09/23/2019, 3:55 AM

## 2019-09-23 NOTE — ED Notes (Signed)
Pt transported to [facility] 

## 2019-09-23 NOTE — ED Notes (Signed)
Pt ambulated to Restroom.

## 2019-09-23 NOTE — Consult Note (Signed)
[facility] Consult  Referring Provider: Dr.Pahwani/[facility] Primary Care Physician:  Ladell Pier, MD Primary Gastroenterologist: Althia Forts  Reason for Consultation: Nausea, coffee-ground emesis, epigastric pain  HPI: Terri Sparks is a 43 y.o. female was admitted yesterday with complains of abdominal pain, epigastric and chest pain along with nausea and vomiting.  Patient states she was in her usual state of health until 3 days ago when she developed epigastric pain associated with nausea.  Initially she had orange-red-colored vomitus followed by multiple episodes of dark brown vomitus. Last bowel movement was yesterday.  She denies noticing black stools or bloody bowel movements.  Patient complains of acid reflux and heartburn.  She denies difficulty swallowing or pain on swallowing.  States she has had an endoscopy several years ago, is unclear about the indication or findings. Patient states she has lost about 10 pounds in the last several months. U tox is positive for opioid, cocaine and THC, however patient states that she has not used opioids or cocaine for at least 3 years but has been smoking marijuana regularly.  Patient has history of peripheral arterial occlusive disease requiring open and percutaneous thrombectomies and was advised to be on Coumadin, which she has not taken since October, it was restarted on this admission. He also has history of insulin-dependent diabetes mellitus and chronic kidney disease along with hypertension, has been noncompliant with the medications due to financial constraints.  In the ED she was found to have leukocytosis WBC 22.5, Hb16.2, weighted platelet 408, BUN/creatinine/GFR of 14/1.8/33, normal LFTs and PT/INR of 12.3/0.9. FOBT was negative. T of the abdomen and pelvis without contrast showed a normal liver, normal pancreas, normal stomach, small bowel and colon. VQ scan was negative for pulmonary embolus. Chest x-ray  was normal. Ultrasound showed a normal CBD, liver and gallbladder.  Past Medical History:  Diagnosis Date  . Anxiety   . Depression   . Diabetes ([place])    Type 2  . GERD (gastroesophageal reflux disease)   . History of blood clots    ,DVT-left leg, and early 2000, had blood clot behind left breast  . History of kidney stones   . Hypercalcemia   . Hyperparathyroidism   . Hypertension    had been on Lisinopril and PCP took her off it 4 months ago  . Iron deficiency anemia   . Left thyroid nodule   . Mass of right ovary   . Nephrolithiasis   . PAOD (peripheral arterial occlusive disease) ([place])   . Peripheral vascular disease ([place])   . Prolonged Q-T interval on ECG 04/19/2019  . Renal calculi   . Substance abuse ([place])   . Tooth loose    top tooth from the right  is loose     Past Surgical History:  Procedure Laterality Date  . ABDOMINAL AORTOGRAM W/LOWER EXTREMITY N/A 11/14/2017   Procedure: ABDOMINAL AORTOGRAM W/LOWER EXTREMITY;  Surgeon: Waynetta Sandy, MD;  Location: [place] [facility];  Service: Cardiovascular;  Laterality: N/A;  . ANGIOPLASTY ILLIAC ARTERY Left 02/25/2018   Procedure: BALLOON ANGIOPLASTY LEFT ILIAC ARTERY;  Surgeon: Conrad [place], MD;  Location: [place];  Service: Vascular;  Laterality: Left;  . APPLICATION OF WOUND VAC Left 03/03/2018   Procedure: APPLICATION OF WOUND VAC;  Surgeon: Angelia Mould, MD;  Location: [place];  Service: Vascular;  Laterality: Left;  . CYSTOSCOPY W/ URETERAL STENT PLACEMENT Right 06/27/2018   Procedure: CYSTOSCOPY WITH RETROGRADE PYELOGRAM/URETERAL DOUBLE J STENT PLACEMENT;  Surgeon: Ellison Hughs  Marjory Lies, MD;  Location: [place];  Service: Urology;  Laterality: Right;  . CYSTOSCOPY WITH STENT PLACEMENT N/A 02/23/2019   Procedure: CYSTOSCOPY WITH RIGHT URETERAL STENT EXCHANGE/ LEFT URETERAL STENT PLACEMENT;  Surgeon: Ceasar Mons, MD;  Location: [facility];  Service: Urology;  Laterality: N/A;  . CYSTOSCOPY  WITH STENT PLACEMENT Right 06/11/2019   Procedure: CYSTOSCOPY,RIGHT RETROGRADE WITH STENT PLACEMENT;  Surgeon: Irine Seal, MD;  Location: [facility];  Service: Urology;  Laterality: Right;  . CYSTOSCOPY/RETROGRADE/URETEROSCOPY/STONE EXTRACTION WITH BASKET Bilateral 05/01/2019   Procedure: CYSTOSCOPY/URETEROSCOPY/STONE EXTRACTION / LASER LITHOTRIPSY, BILATERAL URETEROSCOPY WITH URETERAL STONE EXTRACTION AND STENT EXCHANGE;  Surgeon: Ceasar Mons, MD;  Location: [facility];  Service: Urology;  Laterality: Bilateral;  . CYSTOSCOPY/URETEROSCOPY/HOLMIUM LASER/STENT PLACEMENT Right 06/27/2019   Procedure: CYSTOSCOPY/URETEROSCOPY/HOLMIUM LASER/STENT EXCHANGE;  Surgeon: Ceasar Mons, MD;  Location: [facility];  Service: Urology;  Laterality: Right;  . EMBOLECTOMY Left 02/24/2017   Procedure: Left Lower Extremity Embolectomy and Angiogram.;  Surgeon: Waynetta Sandy, MD;  Location: [place];  Service: Vascular;  Laterality: Left;  . INSERTION OF ILIAC STENT  02/24/2017   Procedure: INSERTION OF Common ILIAC STENT;  Surgeon: Waynetta Sandy, MD;  Location: [place];  Service: Vascular;;  . INTRAOPERATIVE ARTERIOGRAM Left 02/25/2018   Procedure: INTRA OPERATIVE ARTERIOGRAM WITH LEFT LEG RUNOFF;  Surgeon: Conrad [place], MD;  Location: [place];  Service: Vascular;  Laterality: Left;  . LOWER EXTREMITY ANGIOGRAM Left 02/24/2017   Procedure: Aortagram, Left lower extremity Run-off;  Surgeon: Waynetta Sandy, MD;  Location: [place];  Service: Vascular;  Laterality: Left;  . PATCH ANGIOPLASTY Left 02/25/2018   Procedure: PATCH ANGIOPLASTY LEFT SUPERFICIAL FEMORAL ARTERY WITH BOVINE PATCH;  Surgeon: Conrad [place], MD;  Location: [place];  Service: Vascular;  Laterality: Left;  . PERCUTANEOUS VENOUS THROMBECTOMY,LYSIS WITH INTRAVASCULAR ULTRASOUND (IVUS) Left 05/12/2018   Procedure: MECHANICAL THROMBECTOMY LEFT LEG, BALLOON ANGIOPLASTY LEFT ANTERIOR TIBIAL ARTERY, AORTOGRAM  WITH LEFT LEG RUNOFF;  Surgeon: Serafina Mitchell, MD;  Location: [place];  Service: Vascular;  Laterality: Left;  . 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 THROMBECTOMY FEMORAL ARTERY Left 02/25/2018   Procedure: LEFT ILIAC AND POPLITEAL ARTERY THROMBECTOMY;  Surgeon: Conrad [place], MD;  Location: [place];  Service: Vascular;  Laterality: Left;  . TUBAL LIGATION    . WOUND DEBRIDEMENT Left 03/03/2018   Procedure: DEBRIDEMENT WOUND;  Surgeon: Angelia Mould, MD;  Location: [place];  Service: Vascular;  Laterality: Left;  . WOUND EXPLORATION Left 03/03/2018   Procedure: WOUND EXPLORATION;  Surgeon: Angelia Mould, MD;  Location: [facility];  Service: Vascular;  Laterality: Left;    Prior to Admission medications   Medication Sig Start Date End Date Taking? Authorizing Provider  acetaminophen (TYLENOL) 500 MG tablet Take 2,000 mg by mouth daily as needed (for headaches).    Yes [provider]  albuterol (PROAIR HFA) 108 (90 Base) MCG/ACT inhaler Inhale 2 puffs into the lungs every 6 (six) hours as needed for wheezing or shortness of breath. 12/18/18   Ladell Pier, MD  amLODipine (NORVASC) 5 MG tablet Take 1 tablet (5 mg total) by mouth daily. 07/30/19   Ladell Pier, MD  aspirin EC 81 MG tablet Take 81 mg by mouth daily.    [provider]  blood glucose meter kit and supplies Dispense based on patient and insurance preference. Use up to four times daily as directed. (FOR ICD-9 250.00, 250.01). 03/03/17   Mikhail,  Maryann, DO  Blood Glucose Monitoring Suppl (TRUE METRIX METER) w/Device KIT Use as directed 08/01/17   Ladell Pier, MD  buPROPion [facility] SR) 150 MG 12 hr tablet Take 1 tablet (150 mg total) by mouth 2 (two) times daily. 07/30/19   Ladell Pier, MD  ferrous sulfate 325 (65 FE) MG tablet Take 1 tablet (325 mg total) by mouth daily with breakfast. 01/31/18   Ladell Pier, MD  folic acid (FOLVITE) 1 MG tablet Take 1 tablet (1 mg total)  by mouth daily. 07/30/19   Ladell Pier, MD  gabapentin (NEURONTIN) 400 MG capsule Take 1 capsule (400 mg total) by mouth 3 (three) times daily. 07/30/19   Ladell Pier, MD  glucose blood (TRUE METRIX BLOOD GLUCOSE TEST) test strip Use as instructed 07/30/19   Ladell Pier, MD  glucose blood test strip Use as instructed 10/23/18   Ladell Pier, MD  hydrOXYzine (ATARAX/VISTARIL) 50 MG tablet Take 1 tablet (50 mg total) by mouth 3 (three) times daily as needed for itching or anxiety. 07/30/19   Ladell Pier, MD  insulin aspart (NOVOLOG) 100 UNIT/ML injection Inject 5-15 Units into the skin 3 (three) times daily before meals.    [provider]  insulin glargine (LANTUS) 100 UNIT/ML injection Inject 73 Units into the skin at bedtime.    [provider]  Insulin Pen Needle 31G X 5 MM MISC Use with Lantus and Novolog injections. 04/19/19   Ladell Pier, MD  Insulin Syringe-Needle U-100 (BD INSULIN SYRINGE ULTRAFINE) 31G X 5/16" 0.5 ML MISC Use as directed 04/28/17   Ladell Pier, MD  lip balm (CARMEX) ointment Apply topically as needed for lip care. Patient not taking: Reported on 09/23/2019 06/16/19   Jani Gravel, MD  loratadine (CLARITIN) 10 MG tablet Take 1 tablet (10 mg total) by mouth daily as needed for allergies. Patient not taking: Reported on 09/23/2019 06/16/19   Jani Gravel, MD  menthol-cetylpyridinium (CEPACOL) 3 MG lozenge Take 1 lozenge (3 mg total) by mouth as needed for sore throat. Patient not taking: Reported on 09/23/2019 06/16/19   Jani Gravel, MD  nicotine (NICODERM CQ - DOSED IN MG/24 HOURS) 14 mg/24hr patch Place 1 patch (14 mg total) onto the skin daily. 06/16/19   Jani Gravel, MD  nitroGLYCERIN (NITROSTAT) 0.4 MG SL tablet Place 1 tablet (0.4 mg total) under the tongue every 5 (five) minutes as needed for chest pain. Patient not taking: Reported on 09/23/2019 08/16/19   Ladell Pier, MD  omeprazole (PRILOSEC) 20 MG capsule  Take 1 capsule (20 mg total) by mouth 2 (two) times daily before a meal. 07/30/19   Ladell Pier, MD  senna-docusate (SENOKOT-S) 8.6-50 MG tablet Take 1 tablet by mouth 2 (two) times daily. Patient taking differently: Take 2 tablets by mouth daily.  06/16/19   Jani Gravel, MD  sertraline (ZOLOFT) 50 MG tablet Take 1 tablet (50 mg total) by mouth daily. 1/2 tab daily x 2 wks then 1 tab PO daily 07/30/19   Ladell Pier, MD  TRUEPLUS LANCETS 28G MISC Use as directed 08/01/17   Ladell Pier, MD  vitamin B-12 (CYANOCOBALAMIN) 1000 MCG tablet Take 2 tablets (2,000 mcg total) by mouth daily. 07/30/19   Ladell Pier, MD  warfarin (COUMADIN) 5 MG tablet TAKE 2 TABLETS ON MONDAY, WEDNESDAY AND FRIDAY WITH 1 TABLET ALL OTHER DAYS. Patient taking differently: Take 5-10 mg by mouth See admin instructions. Take 5 mg  by mouth in the morning on Sun/Tues/Thurs/Sat and 10 mg on Mon/Wed/Fri 04/19/19   Ladell Pier, MD    Current Facility-Administered Medications  Medication Dose Route Frequency Provider Last Rate Last Admin  . 0.9 %  sodium chloride infusion   Intravenous Continuous Opyd, Ilene Qua, MD 100 mL/hr at 09/23/19 4097 Restarted at 09/23/19 0643  . acetaminophen (TYLENOL) tablet 650 mg  650 mg Oral Q6H PRN Opyd, Ilene Qua, MD       Or  . acetaminophen (TYLENOL) suppository 650 mg  650 mg Rectal Q6H PRN Opyd, Ilene Qua, MD      . albuterol (VENTOLIN HFA) 108 (90 Base) MCG/ACT inhaler 2 puff  2 puff Inhalation Q6H PRN Pahwani, Ravi, MD      . amLODipine (NORVASC) tablet 5 mg  5 mg Oral Daily Darliss Cheney, MD   5 mg at 09/23/19 1015  . aspirin EC tablet 81 mg  81 mg Oral Daily Opyd, Ilene Qua, MD   81 mg at 09/23/19 0800  . buPROPion (WELLBUTRIN SR) 12 hr tablet 150 mg  150 mg Oral BID Darliss Cheney, MD   150 mg at 09/23/19 1015  . cefTRIAXone (ROCEPHIN) 1 g in sodium chloride 0.9 % 100 mL IVPB  1 g Intravenous Q24H Opyd, Timothy S, MD      . enoxaparin (LOVENOX) injection 110 mg   110 mg Subcutaneous BID Opyd, Ilene Qua, MD   110 mg at 09/23/19 0645  . [START ON 09/24/2019] ferrous sulfate tablet 325 mg  325 mg Oral Q breakfast Pahwani, Einar Grad, MD      . folic acid (FOLVITE) tablet 1 mg  1 mg Oral Daily Pahwani, Ravi, MD   1 mg at 09/23/19 1016  . gabapentin (NEURONTIN) capsule 400 mg  400 mg Oral TID Darliss Cheney, MD   400 mg at 09/23/19 1016  . hydrALAZINE (APRESOLINE) injection 10 mg  10 mg Intravenous Q6H PRN Darliss Cheney, MD      . HYDROcodone-acetaminophen (NORCO/VICODIN) 5-325 MG per tablet 1-2 tablet  1-2 tablet Oral Q4H PRN Opyd, Ilene Qua, MD   2 tablet at 09/23/19 1240  . hydrOXYzine (ATARAX/VISTARIL) tablet 50 mg  50 mg Oral TID PRN Darliss Cheney, MD   50 mg at 09/23/19 1240  . insulin aspart (novoLOG) injection 0-9 Units  0-9 Units Subcutaneous Q4H Opyd, Ilene Qua, MD   1 Units at 09/23/19 1224  . insulin glargine (LANTUS) injection 20 Units  20 Units Subcutaneous QHS Opyd, Ilene Qua, MD      . loratadine (CLARITIN) tablet 10 mg  10 mg Oral Daily PRN Darliss Cheney, MD      . menthol-cetylpyridinium (CEPACOL) lozenge 3 mg  1 lozenge Oral PRN Darliss Cheney, MD      . nicotine (NICODERM CQ - dosed in mg/24 hours) patch 14 mg  14 mg Transdermal Daily Pahwani, Einar Grad, MD   14 mg at 09/23/19 1016  . nitroGLYCERIN (NITROSTAT) SL tablet 0.4 mg  0.4 mg Sublingual Q5 min PRN Darliss Cheney, MD      . pantoprazole (PROTONIX) injection 40 mg  40 mg Intravenous Q12H Darliss Cheney, MD   40 mg at 09/23/19 1030  . senna-docusate (Senokot-S) tablet 1 tablet  1 tablet Oral QHS PRN Opyd, Ilene Qua, MD      . sertraline (ZOLOFT) tablet 50 mg  50 mg Oral Daily Darliss Cheney, MD   50 mg at 09/23/19 1014  . sodium chloride flush (NS) 0.9 % injection 3 mL  3 mL Intravenous Q12H Opyd, Ilene Qua, MD      . vitamin B-12 (CYANOCOBALAMIN) tablet 2,000 mcg  2,000 mcg Oral Daily Darliss Cheney, MD   2,000 mcg at 09/23/19 1014  . warfarin (COUMADIN) tablet 10 mg  10 mg Oral ONCE-1800 Opyd,  Ilene Qua, MD      . Warfarin - Pharmacist Dosing Inpatient   Does not apply [id number] Vianne Bulls, MD        Allergies as of 09/22/2019 - Review Complete 09/22/2019  Allergen Reaction Noted  . Ciprofloxacin hcl Hives 03/23/2017  . Macrobid [nitrofurantoin macrocrystal] Hives, Shortness Of Breath, and Rash 05/10/2012  . Other Hives, Shortness Of Breath, and Rash 02/24/2017  . Penicillins Shortness Of Breath 02/25/2017  . Sulfa antibiotics Hives, Shortness Of Breath, and Rash 03/26/2011  . Dilaudid [hydromorphone hcl] Other (See Comments) 06/26/2018  . Lexapro [escitalopram oxalate] Other (See Comments) 01/27/2018    Family History  Problem Relation Age of Onset  . Depression Mother   . Diabetes Father   . Heart failure Father     Social History   Socioeconomic History  . Marital status: Single    Spouse name: Not on file  . Number of children: Not on file  . Years of education: Not on file  . Highest education level: Not on file  Occupational History  . Not on file  Tobacco Use  . Smoking status: Current Some Day Smoker    Packs/day: 0.25    Years: 15.00    Pack years: 3.75    Types: Cigarettes  . Smokeless tobacco: Never Used  Substance and Sexual Activity  . Alcohol use: Yes    Comment: occassionally  . Drug use: Yes    Types: Marijuana, Cocaine    Comment: once daily- last time used was night of 02/21/2019  . Sexual activity: Yes    Partners: Male    Birth control/protection: Other-see comments    Comment: BTL  Other Topics Concern  . Not on file  Social History Narrative  . Not on file   Social Determinants of Health   Financial Resource Strain:   . Difficulty of Paying Living Expenses: Not on file  Food Insecurity:   . Worried About Charity fundraiser in the Last Year: Not on file  . Ran Out of Food in the Last Year: Not on file  Transportation Needs:   . Lack of Transportation (Medical): Not on file  . Lack of Transportation (Non-Medical): Not on  file  Physical Activity:   . Days of Exercise per Week: Not on file  . Minutes of Exercise per Session: Not on file  Stress:   . Feeling of Stress : Not on file  Social Connections:   . Frequency of Communication with Friends and Family: Not on file  . Frequency of Social Gatherings with Friends and Family: Not on file  . Attends Religious Services: Not on file  . Active Member of Clubs or Organizations: Not on file  . Attends Archivist Meetings: Not on file  . Marital Status: Not on file  Intimate Partner Violence:   . Fear of Current or Ex-Partner: Not on file  . Emotionally Abused: Not on file  . Physically Abused: Not on file  . Sexually Abused: Not on file    Review of Systems: Positive for: GI: Described in detail in HPI.    Gen: fatigue, weakness, malaise, involuntary weight loss, Denies any fever, chills, rigors, night sweats, anorexia,  and sleep disorder CV: chest pain, Denies angina, palpitations, syncope, orthopnea, PND, peripheral edema, and claudication. Resp: Denies dyspnea, cough, sputum, wheezing, coughing up blood. GU : Denies urinary burning, blood in urine, urinary frequency, urinary hesitancy, nocturnal urination, and urinary incontinence. MS: Denies joint pain or swelling.  Denies muscle weakness, cramps, atrophy.  Derm: Denies rash, itching, oral ulcerations, hives, unhealing ulcers.  Psych: Denies depression, anxiety, memory loss, suicidal ideation, hallucinations,  and confusion. Heme: Denies bruising, bleeding, and enlarged lymph nodes. Neuro:  Denies any headaches, dizziness, paresthesias. Endo:  DM, Denies any problems with thyroid, adrenal function.  Physical Exam: Vital signs in last 24 hours: Temp:  [98.3 F (36.8 C)-98.7 F (37.1 C)] 98.3 F (36.8 C) (12/20 0953) Pulse Rate:  [94-125] 111 (12/20 0953) Resp:  [11-25] 20 (12/20 0953) BP: (127-165)/(65-109) 135/88 (12/20 0953) SpO2:  [93 %-100 %] 99 % (12/20 0953) Weight:  [114.3 kg]  114.3 kg (12/19 1541) Last BM Date: 09/21/19  General:   Alert,  Well-developed, obese, pleasant and cooperative in NAD Head:  Normocephalic and atraumatic. Eyes:  Sclera clear, no icterus.   Conjunctiva pink. Ears:  Normal auditory acuity. Nose:  No deformity, discharge,  or lesions. Mouth:  No deformity or lesions.  Oropharynx pink & moist. Neck:  Supple; no masses or thyromegaly. Lungs:  Clear throughout to auscultation.   No wheezes, crackles, or rhonchi. No acute distress. Heart:  Regular rate and rhythm; no murmurs, clicks, rubs,  or gallops. Extremities:  Without clubbing or edema. Neurologic:  Alert and  oriented x4;  grossly normal neurologically. Skin:  Intact without significant lesions or rashes. Psych:  Alert and cooperative. Normal mood and affect. Abdomen:  Soft, nontender and nondistended. No masses, hepatosplenomegaly or hernias noted. Normal bowel sounds, without guarding, and without rebound.         Lab Results: Recent Labs    09/22/19 1550 09/23/19 0523  WBC 22.5* 18.9*  HGB 16.2* 14.5  HCT 50.4* 46.5*  PLT 408* 339   BMET Recent Labs    09/22/19 1550 09/23/19 0523  NA 133* 137  K 3.7 3.5  CL 94* 104  CO2 25 23  GLUCOSE 256* 192*  BUN 14 12  CREATININE 1.84* 1.63*  CALCIUM 10.2 9.0   LFT Recent Labs    09/22/19 1550 09/23/19 0523  PROT 8.2* 7.0  ALBUMIN 4.2 3.4*  AST 14* 11*  ALT 15 12  ALKPHOS 93 78  BILITOT 0.6 0.5  BILIDIR <0.1  --   IBILI NOT CALCULATED  --    PT/INR Recent Labs    09/22/19 1550  LABPROT 12.3  INR 0.9    Studies/Results: CT ABDOMEN PELVIS WO CONTRAST  Result Date: 09/22/2019 CLINICAL DATA:  Chest pain. Abdominal pain. EXAM: CT ABDOMEN AND PELVIS WITHOUT CONTRAST TECHNIQUE: Multidetector CT imaging of the abdomen and pelvis was performed following the standard protocol without IV contrast. COMPARISON:  06/11/2019 FINDINGS: Lower chest: The lung bases are clear. The heart size is normal. Hepatobiliary: The  liver is normal. The gallbladder is distended without definite evidence for acute cholecystitis.There is no biliary ductal dilation. Pancreas: Normal contours without ductal dilatation. No peripancreatic fluid collection. Spleen: No splenic laceration or hematoma. Adrenals/Urinary Tract: --Adrenal glands: There is a stable left adrenal adenoma. --Right kidney/ureter: There are multiple nonobstructing stones in the lower pole the right kidney. There is minimal right-sided collecting system dilatation without evidence for an obstructing stone. The previously noted right ureteral stone has resolved. --Left kidney/ureter: The left  kidney is somewhat atrophic in comparison to the right. Multiple nonobstructing stones are noted. There are several calcifications in the cortex. Pockets of gas are again noted and are relatively similar to prior study. There is no hydronephrosis. --Urinary bladder: Small amount of gas is noted within the nondependent portion of the urinary bladder. Stomach/Bowel: --Stomach/Duodenum: No hiatal hernia or other gastric abnormality. Normal duodenal course and caliber. --Small bowel: No dilatation or inflammation. --Colon: No focal abnormality. --Appendix: Normal. Vascular/Lymphatic: Atherosclerotic calcification is present within the non-aneurysmal abdominal aorta, without hemodynamically significant stenosis. A left common iliac artery stent noted. --No retroperitoneal lymphadenopathy. --No mesenteric lymphadenopathy. --No pelvic or inguinal lymphadenopathy. Reproductive: Unremarkable Other: No ascites or free air. There are postsurgical changes of the left inguinal region. Musculoskeletal. No acute displaced fractures. IMPRESSION: 1. No CT evidence for acute intra-abdominal or pelvic pathology. 2. Interval resolution of the previously noted right ureteral stone. 3. Bilateral nonobstructive nephrolithiasis. 4. Small amount of gas within the nondependent portion of the urinary bladder. Correlate  with recent instrumentation. Again noted is gas within the left kidney, similar to prior study. This too is favored to be secondary to prior instrumentation. A gas-forming organism is not entirely excluded and should be correlated with urinalysis. 5. Distended mild distention of the gallbladder without CT evidence for acute cholecystitis. Aortic Atherosclerosis (ICD10-I70.0). Electronically Signed   By: Constance Holster M.D.   On: 09/22/2019 19:22   DG Chest 2 View  Result Date: 09/22/2019 CLINICAL DATA:  Chest pain beginning yesterday. Vomiting. Hematemesis. EXAM: CHEST - 2 VIEW COMPARISON:  06/26/2018 FINDINGS: The heart size and mediastinal contours are within normal limits. Both lungs are clear. The visualized skeletal structures are unremarkable. IMPRESSION: Negative.  No active cardiopulmonary disease. Electronically Signed   By: Marlaine Hind M.D.   On: 09/22/2019 17:32   NM Pulmonary Perfusion  Result Date: 09/23/2019 CLINICAL DATA:  Chest pain. EXAM: NUCLEAR MEDICINE PERFUSION LUNG SCAN TECHNIQUE: Perfusion images were obtained in multiple projections after intravenous injection of radiopharmaceutical. Ventilation scans intentionally deferred if perfusion scan and chest x-ray adequate for interpretation during COVID 19 epidemic. RADIOPHARMACEUTICALS:  1.48 mCi Tc-47mMAA IV COMPARISON:  Chest radiograph 8 hours ago FINDINGS: Homogeneous perfusion to both lungs.  No focal perfusion defect. IMPRESSION: No evidence of pulmonary embolus.  Normal perfusion scan. Electronically Signed   By: MKeith RakeM.D.   On: 09/23/2019 01:59   UKoreaAbdomen Limited  Result Date: 09/22/2019 CLINICAL DATA:  Right upper quadrant pain for 3 days. EXAM: ULTRASOUND ABDOMEN LIMITED RIGHT UPPER QUADRANT COMPARISON:  None. FINDINGS: Technically difficult exam due to patient habitus. Gallbladder: No gallstones or wall thickening visualized. No sonographic Murphy sign noted by sonographer. Common bile duct: Diameter:  3 mm, within normal limits. Liver: No focal lesion identified. Within normal limits in parenchymal echogenicity. Portal vein is patent on color Doppler imaging with normal direction of blood flow towards the liver. Other: None. IMPRESSION: Negative.  No hepatobiliary abnormality identified. Electronically Signed   By: JMarlaine HindM.D.   On: 09/22/2019 17:09    Impression: Nausea, coffee-ground emesis, epigastric pain Hemoglobin normal 14.5 today, BUN normal at 14 Hemodynamically stable, blood pressure 135/88, but tachycardic  Started on clear liquid diet, is able to tolerate Sprite today morning at 9 AM, to be started on Coumadin 10 mg at 6 PM today.  Multiple comorbidities-substance abuse, obesity, hypertension, chronic kidney disease, peripheral arterial occlusive disease, history of stents, used to be on Plavix  Plan: Coffee-ground emesis could be  related to esophagitis or Mallory-Weiss tear No plans for endoscopic intervention Plan conservative management Clear liquid diet and advance as tolerated PPI twice daily We will add sucralfate 1 g 4 times daily while patient is in the hospital. If patient has evidence of active hematemesis, melena or hematochezia, or hemodynamic instability, please notify us immediately. GI will follow from a distance.   LOS: 0 days   Ronnette Juniper, MD  09/23/2019, 12:54 PM

## 2019-09-23 NOTE — Progress Notes (Signed)
Patient in sinus tachycardia on admission and since. Verified with another set of vital signs. Pt in no acute distress.

## 2019-09-23 NOTE — Progress Notes (Signed)
Patient came with ST MD  is aware. Will continue to monitor.

## 2019-09-23 NOTE — Progress Notes (Signed)
Patient c/o chest pain, pt. Describe the pain as gas pain, protonix and pain med given. MD notified via text  will continue to monitor the patient.

## 2019-09-23 NOTE — ED Notes (Signed)
Pt given ice chips per PA.  ?

## 2019-09-24 DIAGNOSIS — K2970 Gastritis, unspecified, without bleeding: Secondary | ICD-10-CM | POA: Diagnosis present

## 2019-09-24 DIAGNOSIS — A419 Sepsis, unspecified organism: Principal | ICD-10-CM

## 2019-09-24 DIAGNOSIS — R079 Chest pain, unspecified: Secondary | ICD-10-CM

## 2019-09-24 DIAGNOSIS — R000 Tachycardia, unspecified: Secondary | ICD-10-CM

## 2019-09-24 DIAGNOSIS — N390 Urinary tract infection, site not specified: Secondary | ICD-10-CM

## 2019-09-24 LAB — BASIC METABOLIC PANEL
Anion gap: 7 (ref 5–15)
BUN: 10 mg/dL (ref 6–20)
CO2: 23 mmol/L (ref 22–32)
Calcium: 9.1 mg/dL (ref 8.9–10.3)
Chloride: 105 mmol/L (ref 98–111)
Creatinine, Ser: 1.68 mg/dL — ABNORMAL HIGH (ref 0.44–1.00)
GFR calc Af Amer: 42 mL/min — ABNORMAL LOW (ref >60)
GFR calc non Af Amer: 37 mL/min — ABNORMAL LOW (ref >60)
Glucose, Bld: 130 mg/dL — ABNORMAL HIGH (ref 70–99)
Potassium: 3.5 mmol/L (ref 3.5–5.1)
Sodium: 135 mmol/L (ref 135–145)

## 2019-09-24 LAB — GLUCOSE, CAPILLARY
Glucose-Capillary: 100 mg/dL — ABNORMAL HIGH (ref 70–99)
Glucose-Capillary: 100 mg/dL — ABNORMAL HIGH (ref 70–99)
Glucose-Capillary: 140 mg/dL — ABNORMAL HIGH (ref 70–99)
Glucose-Capillary: 141 mg/dL — ABNORMAL HIGH (ref 70–99)

## 2019-09-24 LAB — CBC WITH DIFFERENTIAL/PLATELET
Abs Immature Granulocytes: 0.03 10*3/uL (ref 0.00–0.07)
Basophils Absolute: 0.1 10*3/uL (ref 0.0–0.1)
Basophils Relative: 1 %
Eosinophils Absolute: 0.4 10*3/uL (ref 0.0–0.5)
Eosinophils Relative: 4 %
HCT: 40.4 % (ref 36.0–46.0)
Hemoglobin: 12.8 g/dL (ref 12.0–15.0)
Immature Granulocytes: 0 %
Lymphocytes Relative: 24 %
Lymphs Abs: 2.7 10*3/uL (ref 0.7–4.0)
MCH: 30.1 pg (ref 26.0–34.0)
MCHC: 31.7 g/dL (ref 30.0–36.0)
MCV: 95.1 fL (ref 80.0–100.0)
Monocytes Absolute: 0.7 10*3/uL (ref 0.1–1.0)
Monocytes Relative: 7 %
Neutro Abs: 7.1 10*3/uL (ref 1.7–7.7)
Neutrophils Relative %: 64 %
Platelets: 275 10*3/uL (ref 150–400)
RBC: 4.25 MIL/uL (ref 3.87–5.11)
RDW: 13.8 % (ref 11.5–15.5)
WBC: 11.2 10*3/uL — ABNORMAL HIGH (ref 4.0–10.5)
nRBC: 0 % (ref 0.0–0.2)

## 2019-09-24 LAB — PROTIME-INR
INR: 1.1 (ref 0.8–1.2)
Prothrombin Time: 13.8 seconds (ref 11.4–15.2)

## 2019-09-24 MED ORDER — INSULIN ASPART 100 UNIT/ML ~~LOC~~ SOLN: 5.0000 [IU] | Freq: Three times a day (TID) | SUBCUTANEOUS | 3 refills | Status: DC

## 2019-09-24 MED ORDER — BUPROPION HCL ER (SR) 150 MG PO TB12: 150.0000 mg | ORAL_TABLET | Freq: Two times a day (BID) | ORAL | 11 refills | Status: DC

## 2019-09-24 MED ORDER — LORATADINE 10 MG PO TABS: 10.0000 mg | ORAL_TABLET | Freq: Every day | ORAL | 2 refills | Status: DC | PRN

## 2019-09-24 MED ORDER — OMEPRAZOLE 20 MG PO CPDR: 20.0000 mg | DELAYED_RELEASE_CAPSULE | Freq: Two times a day (BID) | ORAL | 0 refills | Status: DC

## 2019-09-24 MED ORDER — INSULIN SYRINGES (DISPOSABLE) U-100 1 ML MISC: 0 refills | Status: DC

## 2019-09-24 MED ORDER — FERROUS SULFATE 325 (65 FE) MG PO TABS: 325.0000 mg | ORAL_TABLET | Freq: Every day | ORAL | 3 refills | Status: DC

## 2019-09-24 MED ORDER — NICOTINE 14 MG/24HR TD PT24: 14.0000 mg | MEDICATED_PATCH | Freq: Every day | TRANSDERMAL | 2 refills | Status: DC

## 2019-09-24 MED ORDER — GABAPENTIN 400 MG PO CAPS: 400.0000 mg | ORAL_CAPSULE | Freq: Three times a day (TID) | ORAL | 3 refills | Status: DC

## 2019-09-24 MED ORDER — SUCRALFATE 1 GM/10ML PO SUSP: 1.0000 g | Freq: Three times a day (TID) | ORAL | 0 refills | Status: DC

## 2019-09-24 MED ORDER — CEFUROXIME AXETIL 250 MG PO TABS: 250.0000 mg | ORAL_TABLET | Freq: Two times a day (BID) | ORAL | 0 refills | Status: AC

## 2019-09-24 MED ORDER — SENNOSIDES-DOCUSATE SODIUM 8.6-50 MG PO TABS: 1.0000 | ORAL_TABLET | Freq: Two times a day (BID) | ORAL | 2 refills | Status: DC

## 2019-09-24 MED ORDER — HYDROXYZINE HCL 50 MG PO TABS: 50.0000 mg | ORAL_TABLET | Freq: Three times a day (TID) | ORAL | 4 refills | Status: DC | PRN

## 2019-09-24 MED ORDER — ASPIRIN EC 81 MG PO TBEC: 81.0000 mg | DELAYED_RELEASE_TABLET | Freq: Every day | ORAL | 11 refills | Status: DC

## 2019-09-24 MED ORDER — ENOXAPARIN SODIUM 120 MG/0.8ML ~~LOC~~ SOLN
120.0000 mg | Freq: Two times a day (BID) | SUBCUTANEOUS | Status: DC
  Administered 2019-09-24: 120 mg via SUBCUTANEOUS
  Filled 2019-09-24: qty 0.8

## 2019-09-24 MED ORDER — SERTRALINE HCL 50 MG PO TABS: 50.0000 mg | ORAL_TABLET | Freq: Every day | ORAL | 4 refills | Status: DC

## 2019-09-24 MED ORDER — ALBUTEROL SULFATE HFA 108 (90 BASE) MCG/ACT IN AERS: 2.0000 | INHALATION_SPRAY | Freq: Four times a day (QID) | RESPIRATORY_TRACT | 2 refills | Status: DC | PRN

## 2019-09-24 MED ORDER — OMEPRAZOLE 20 MG PO CPDR: 20.0000 mg | DELAYED_RELEASE_CAPSULE | Freq: Two times a day (BID) | ORAL | 3 refills | Status: DC

## 2019-09-24 MED ORDER — APIXABAN 5 MG PO TABS: 5.0000 mg | ORAL_TABLET | Freq: Two times a day (BID) | ORAL | 0 refills | Status: DC

## 2019-09-24 MED ORDER — FOLIC ACID 1 MG PO TABS: 1.0000 mg | ORAL_TABLET | Freq: Every day | ORAL | 3 refills | Status: DC

## 2019-09-24 MED ORDER — AMLODIPINE BESYLATE 5 MG PO TABS: 5.0000 mg | ORAL_TABLET | Freq: Every day | ORAL | 11 refills | Status: DC

## 2019-09-24 MED ORDER — WARFARIN SODIUM 10 MG PO TABS: 10.0000 mg | ORAL_TABLET | Freq: Once | ORAL | Status: DC

## 2019-09-24 MED ORDER — VITAMIN B-12 1000 MCG PO TABS: 2000.0000 ug | ORAL_TABLET | Freq: Every day | ORAL | 3 refills | Status: DC

## 2019-09-24 MED ORDER — INSULIN GLARGINE 100 UNIT/ML ~~LOC~~ SOLN: 73.0000 [IU] | Freq: Every day | SUBCUTANEOUS | 2 refills | Status: DC

## 2019-09-24 MED ORDER — NITROGLYCERIN 0.4 MG SL SUBL: 0.4000 mg | SUBLINGUAL_TABLET | SUBLINGUAL | 3 refills | Status: DC | PRN

## 2019-09-24 MED FILL — SERTRALINE HCL 50 MG TABLET: 50 | 30 days supply | Qty: 30 | Fill #0

## 2019-09-24 MED FILL — VENTOLIN HFA 90 MCG INHALER: 108 (90 BAS | 25 days supply | Qty: 18 | Fill #0

## 2019-09-24 MED FILL — NovoLOG 100 UNIT/ML SOLN: 100 | 15 days supply | Qty: 10 | Fill #0

## 2019-09-24 MED FILL — buPROPion HCL ER (SR) 150 M: 150 | 30 days supply | Qty: 60 | Fill #0

## 2019-09-24 MED FILL — hydrOXYzine HCL 25 MG TABS: 25 | 30 days supply | Qty: 180 | Fill #0

## 2019-09-24 MED FILL — FOLIC ACID 1 MG TABS: 1 | 30 days supply | Qty: 30 | Fill #0

## 2019-09-24 MED FILL — VITAMIN B-12 1000 MCG TABS: 1000 | 15 days supply | Qty: 30 | Fill #0

## 2019-09-24 MED FILL — GABAPENTIN 400 MG CAPSULE: 400 | 30 days supply | Qty: 90 | Fill #0

## 2019-09-24 MED FILL — SUCRALFATE 1 GM/10ML SUSP: 1 | 10 days supply | Qty: 420 | Fill #0

## 2019-09-24 MED FILL — OMEPRAZOLE 20 MG CPDR: 20 | 30 days supply | Qty: 60 | Fill #0

## 2019-09-24 MED FILL — FERROUS SULFATE 325 MG TAB: 325 (65 FE) | 30 days supply | Qty: 30 | Fill #0

## 2019-09-24 MED FILL — ASPIRIN LOW DOSE 81 MG TBEC: 81 | 30 days supply | Qty: 30 | Fill #0

## 2019-09-24 MED FILL — CEFUROXIME AXETIL 250 MG TA: 250 | 5 days supply | Qty: 10 | Fill #0

## 2019-09-24 MED FILL — AMLODIPINE BESYLATE 5 MG TA: 5 | 30 days supply | Qty: 30 | Fill #0

## 2019-09-24 MED FILL — NITROGLYCERIN 0.4 MG TAB SL: 0.4 | 8 days supply | Qty: 25 | Fill #0

## 2019-09-24 MED FILL — ELIQUIS 5 MG TABLET: 5 | 30 days supply | Qty: 60 | Fill #0

## 2019-09-24 MED FILL — ULTICARE INS SYR 1 ML 30GX1: 30G X 1/2" | 30 days supply | Qty: 100 | Fill #0

## 2019-09-24 MED FILL — LANTUS 100 UNITS/ML VIAL: 100 | 13 days supply | Qty: 10 | Fill #0

## 2019-09-24 NOTE — Progress Notes (Addendum)
[facility] for Coumadin Indication: h/o iliac thrombosis  Allergies  Allergen Reactions  . Ciprofloxacin Hcl Hives  . Dilaudid [Hydromorphone Hcl] Shortness Of Breath and Other (See Comments)    "Asystole," per pt report  . Macrobid [Nitrofurantoin Macrocrystal] Hives, Shortness Of Breath and Rash  . Other Hives, Shortness Of Breath and Rash    NO "-CILLINS"!!!  . Penicillins Shortness Of Breath    Had had cephalosporins without incident Has patient had a PCN reaction causing immediate rash, facial/tongue/throat swelling, SOB or lightheadedness with hypotension: Yes Has patient had a PCN reaction causing severe rash involving mucus membranes or skin necrosis: Unk Has patient had a PCN reaction that required hospitalization: Unk Has patient had a PCN reaction occurring within the last 10 years: No If all of the above answers are "NO", then may proceed with Cephalosporin use.   . Sulfa Antibiotics Hives, Shortness Of Breath and Rash  . Lexapro [Escitalopram Oxalate] Other (See Comments)    "I just did not like it."    Patient Measurements: Height: _0  (170.2 cm) Weight: 265 lb 4.8 oz (120.3 kg) IBW/kg (Calculated) : 61.6 Heparin Dosing Weight: 90 kg  Vital Signs: Temp: 98.3 F (36.8 C) (12/21 0722) Temp Source: Oral (12/21 0722) BP: 135/79 (12/21 0722) Pulse Rate: 94 (12/21 0722)  Labs: Recent Labs    09/22/19 1550 09/22/19 1730 09/23/19 0523 09/24/19 0456  HGB 16.2*  --  14.5  --   HCT 50.4*  --  46.5*  --   PLT 408*  --  339  --   LABPROT 12.3  --   --  13.8  INR 0.9  --   --  1.1  CREATININE 1.84*  --  1.63*  --   TROPONINIHS _1 --     Estimated Creatinine Clearance: 59.2 mL/min (A) (by C-G formula based on SCr of 1.63 mg/dL (H)).   Medical History: Past Medical History:  Diagnosis Date  . Anxiety   . Depression   . Diabetes ([place])    Type 2  . GERD (gastroesophageal reflux disease)   . History of blood  clots    ,DVT-left leg, and early 2000, had blood clot behind left breast  . History of kidney stones   . Hypercalcemia   . Hyperparathyroidism   . Hypertension    had been on Lisinopril and PCP took her off it 4 months ago  . Iron deficiency anemia   . Left thyroid nodule   . Mass of right ovary   . Nephrolithiasis   . 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 Peripheral vascular disease ([place])   . Prolonged Q-T interval on ECG 04/19/2019  . Renal calculi   . Substance abuse ([place])   . Tooth loose    top tooth from the right  is loose     Medications:  No current facility-administered medications on file prior to encounter.   Current Outpatient Medications on File Prior to Encounter  Medication Sig Dispense Refill  . acetaminophen (TYLENOL) 500 MG tablet Take 2,000 mg by mouth daily as needed (for headaches).     Marland Kitchen albuterol (PROAIR HFA) 108 (90 Base) MCG/ACT inhaler Inhale 2 puffs into the lungs every 6 (six) hours as needed for wheezing or shortness of breath. 18 g 0  . amLODipine (NORVASC) 5 MG tablet Take 1 tablet (5 mg total) by mouth daily. 30 tablet 3  . aspirin EC 81 MG tablet Take  81 mg by mouth daily.    . blood glucose meter kit and supplies Dispense based on patient and insurance preference. Use up to four times daily as directed. (FOR ICD-9 250.00, 250.01). 1 each 0  . Blood Glucose Monitoring Suppl (TRUE METRIX METER) w/Device KIT Use as directed 1 kit 0  . buPROPion (WELLBUTRIN SR) 150 MG 12 hr tablet Take 1 tablet (150 mg total) by mouth 2 (two) times daily. 60 tablet 3  . ferrous sulfate 325 (65 FE) MG tablet Take 1 tablet (325 mg total) by mouth daily with breakfast. 427 tablet 1  . folic acid (FOLVITE) 1 MG tablet Take 1 tablet (1 mg total) by mouth daily. 30 tablet 2  . gabapentin (NEURONTIN) 400 MG capsule Take 1 capsule (400 mg total) by mouth 3 (three) times daily. 90 capsule 4  . glucose blood (TRUE METRIX BLOOD GLUCOSE TEST) test strip Use as  instructed 100 each 12  . glucose blood test strip Use as instructed 100 each 12  . hydrOXYzine (ATARAX/VISTARIL) 50 MG tablet Take 1 tablet (50 mg total) by mouth 3 (three) times daily as needed for itching or anxiety. 90 tablet 3  . insulin aspart (NOVOLOG) 100 UNIT/ML injection Inject 5-15 Units into the skin 3 (three) times daily before meals.    . insulin glargine (LANTUS) 100 UNIT/ML injection Inject 73 Units into the skin at bedtime.    . Insulin Pen Needle 31G X 5 MM MISC Use with Lantus and Novolog injections. 100 each 11  . Insulin Syringe-Needle U-100 (BD INSULIN SYRINGE ULTRAFINE) 31G X 5/16" 0.5 ML MISC Use as directed 100 each 3  . lip balm (CARMEX) ointment Apply topically as needed for lip care. (Patient not taking: Reported on 09/23/2019) 7 g 0  . loratadine (CLARITIN) 10 MG tablet Take 1 tablet (10 mg total) by mouth daily as needed for allergies. (Patient not taking: Reported on 09/23/2019) 30 tablet 0  . menthol-cetylpyridinium (CEPACOL) 3 MG lozenge Take 1 lozenge (3 mg total) by mouth as needed for sore throat. (Patient not taking: Reported on 09/23/2019) 100 tablet 12  . nicotine (NICODERM CQ - DOSED IN MG/24 HOURS) 14 mg/24hr patch Place 1 patch (14 mg total) onto the skin daily. 28 patch 0  . nitroGLYCERIN (NITROSTAT) 0.4 MG SL tablet Place 1 tablet (0.4 mg total) under the tongue every 5 (five) minutes as needed for chest pain. (Patient not taking: Reported on 09/23/2019) 50 tablet 3  . omeprazole (PRILOSEC) 20 MG capsule Take 1 capsule (20 mg total) by mouth 2 (two) times daily before a meal. 60 capsule 4  . senna-docusate (SENOKOT-S) 8.6-50 MG tablet Take 1 tablet by mouth 2 (two) times daily. (Patient taking differently: Take 2 tablets by mouth daily. ) 60 tablet 0  . sertraline (ZOLOFT) 50 MG tablet Take 1 tablet (50 mg total) by mouth daily. 1/2 tab daily x 2 wks then 1 tab PO daily 30 tablet 3  . TRUEPLUS LANCETS 28G MISC Use as directed 100 each 6  . vitamin B-12  (CYANOCOBALAMIN) 1000 MCG tablet Take 2 tablets (2,000 mcg total) by mouth daily. 60 tablet 3  . warfarin (COUMADIN) 5 MG tablet TAKE 2 TABLETS ON MONDAY, WEDNESDAY AND FRIDAY WITH 1 TABLET ALL OTHER DAYS. (Patient taking differently: Take 5-10 mg by mouth See admin instructions. Take 5 mg by mouth in the morning on Sun/Tues/Thurs/Sat and 10 mg on Mon/Wed/Fri) 60 tablet 3     Assessment: 43 y.o. female admitted with  chest pain, h/o iliac thrombosis and LLE thrombectomy, INR subtherapeutic on admission at 0.9, for anticoagulation.  Warfarin PTA regimen 5 mg daily except 10 mg MWF.  INR today remains subtherapeutic at 1.1. Received warfarin 10 mg on 12/20. No noted s/sx of bleeding. On enoxaparin 110 mg (1 mg/kg) every 12 hours until INR>2. Wt today increased from 114 kg to 120 kg.  Goal of Therapy:  INR 2-3 Monitor platelets by anticoagulation protocol: Yes   Plan:  Coumadin 10 mg today Increase enoxaparin to 120 mg every 12 hours given wt Monitor daily INR, CBC q 72 hours, and for s/sx of bleeding  Antonietta Jewel, PharmD, BCCCP Clinical Pharmacist  Phone: [phone]  Please check AMION for all [place] phone numbers After 10:00 PM, call [place] [phone] 09/24/2019,8:28 AM   ADDENDUM Plan to change to apixaban at discharge. Given INR<2, will plan on starting apixaban tonight (received enoxaparin dose this morning at 0913). Will provide education and enter case management consult to evaluate cost at discharge. Will order apixaban 5 mg twice daily.   Antonietta Jewel, PharmD, [place] Clinical Pharmacist

## 2019-09-24 NOTE — Discharge Summary (Addendum)
Physician Discharge Summary  Terri Sparks DJT:[id number] DOB: [birth year] DOA: 09/22/2019  PCP: Ladell Pier, MD  Admit date: 09/22/2019 Discharge date: 09/24/2019  Admitted From: Home Disposition: Home  Recommendations for Outpatient Follow-up:  1. Follow up with PCP in 1-2 weeks 2. Please obtain BMP/CBC in one week 3. Please follow up on the following pending results:  Home Health: None Equipment/Devices: None  Discharge Condition: Stable CODE STATUS: Full code Diet recommendation: Cardiac  Subjective: Seen and examined.  She states that she feels much better after she was given Carafate.  Normal abdominal pain or chest pain.  Heart rate has improved as well.  Brief/Interim Summary: Terri Onstad Sheltonis a 43 y.o.femalewith medical history significant forinsulin-dependent diabetes mellitus, chronic kidney disease stage IIIb, hypertension, polysubstance abuse, nephrolithiasis, and peripheral arterial occlusive disease status post open and percutaneous thrombectomies, DVT who is supposed to be on Coumadin, presented to the emergency department with abdominal pain, chest pain, nausea, and vomiting for last 3 days. She has been out of her medications for 2 months due to financial constraints.  Associated with right flank pain. She also has chronic left flank pain that has been unchanged. This has been associated with nausea and vomiting. She reports seeing some dark vomitus. She denies diarrhea, melena, or hematochezia. She also denies fevers, chills, or cough. She reports some chest pain for the past day or so that she has been attributing to the vomiting. She reports chronic left lower leg pain that is unchanged.  Upon arrival to the ED, patient is found to beafebrile, saturating well on room air, tachypneic, tachycardic, and with stable blood pressure. EKG features sinus tachycardia with rate 112 and PVCs. Chest x-ray is negative for acute cardiopulmonary disease. CT  the abdomen and pelvis is negative for acute intra-abdominal or pelvic abnormality. Right upper quadrant ultrasound is negative. VQ scan is within normal limits. High-sensitivity troponin is normal x2. INR 0.9. UDS positive for opiates, cocaine, and THC. Chemistry panel notable for glucose 256 and creatinine 1.84, similar to priors. CBC features a leukocytosis to 22,500, slight polycythemia, and platelets 408,000. Urinalysis concerning for UTI. Fecal occult blood testing negative.  Blood culture was negative but urine culture was not collected.  COVID-19 negative.  She was given Rocephin.  Admitted to hospital service secondary to sepsis due to UTI, hyperglycemia, chest pain and abdominal pain.  She was ruled out of MI.  Transthoracic echo showed slightly reduced ejection fraction but there was no change compared to her previous echo done 1 and half year ago.  Due to concern of gastritis, upper GI bleed, GERD and possible Mallory-Weiss tear based on the history, GI was consulted however the deferred EGD and started her on PPI and Carafate which improved patient's symptoms.  In the meantime, her tachycardia also improved.  Looking back with extensive chart review, it seems like patient has a history of intermittent tachycardia in the past.  This could very well be due to patient's cocaine abuse.  Although she is positive for cocaine, opiates and THC on her UDS but she is adamant that she has not used cocaine in last 3 years and has not used opiates but she endorsed using THC.  Due to questionable cocaine use, she is not a candidate for beta-blockers.  She was also not taking her Coumadin and her INR was subtherapeutic.  She was started here on Coumadin with bridging Lovenox.  Now she has been cleared by GI and is doing well and has tolerated soft  diet so she is going to be discharged in stable condition.  After having a long discussion with her and verifying with our social worker that Eliquis will be  covered, we are switching her to Eliquis starting tonight.  She is also being discharged on omeprazole for a month and Carafate for 10 days.  She will resume her rest of the home medications.  I provided counseling regarding quitting polysubstance abuse.  She verbalized understanding.  She is agreeable with the plan of discharge.  She received 2-3 doses of Rocephin here.  I am discharging her on Ceftin 250 mg p.o. daily for 5 days.  Discharge Diagnoses:  Principal Problem:   Sepsis secondary to UTI [facility]) Active Problems:   GERD (gastroesophageal reflux disease)   Insulin-requiring or dependent type II diabetes mellitus (HCC)   Anxiety and depression   Thrombosis of left iliac artery (HCC)   Essential hypertension   Subtherapeutic international normalized ratio (INR)   Stage 3b chronic kidney disease   Polysubstance abuse ([place])   Chest pain at rest   Gastritis   Sinus tachycardia    Discharge Instructions  Discharge Instructions    Discharge patient   Complete by: As directed    Discharge disposition: 01-Home or Self Care   Discharge patient date: 09/24/2019     Allergies as of 09/24/2019      Reactions   Ciprofloxacin Hcl Hives   Dilaudid [hydromorphone Hcl] Shortness Of Breath, Other (See Comments)   "Asystole," per pt report   Macrobid [nitrofurantoin Macrocrystal] Hives, Shortness Of Breath, Rash   Other Hives, Shortness Of Breath, Rash   NO "-CILLINS"!!!   Penicillins Shortness Of Breath   Had had cephalosporins without incident Has patient had a PCN reaction causing immediate rash, facial/tongue/throat swelling, SOB or lightheadedness with hypotension: Yes Has patient had a PCN reaction causing severe rash involving mucus membranes or skin necrosis: Unk Has patient had a PCN reaction that required hospitalization: Unk Has patient had a PCN reaction occurring within the last 10 years: No If all of the above answers are "NO", then may proceed with Cephalosporin use.    Sulfa Antibiotics Hives, Shortness Of Breath, Rash   Lexapro [escitalopram Oxalate] Other (See Comments)   "I just did not like it."      Medication List    STOP taking these medications   warfarin 5 MG tablet Commonly known as: COUMADIN     TAKE these medications   acetaminophen 500 MG tablet Commonly known as: TYLENOL Take 2,000 mg by mouth daily as needed (for headaches).   albuterol 108 (90 Base) MCG/ACT inhaler Commonly known as: ProAir HFA Inhale 2 puffs into the lungs every 6 (six) hours as needed for wheezing or shortness of breath.   amLODipine 5 MG tablet Commonly known as: NORVASC Take 1 tablet (5 mg total) by mouth daily.   apixaban 5 MG Tabs tablet Commonly known as: Eliquis Take 1 tablet (5 mg total) by mouth 2 (two) times daily.   aspirin EC 81 MG tablet Take 81 mg by mouth daily.   blood glucose meter kit and supplies Dispense based on patient and insurance preference. Use up to four times daily as directed. (FOR ICD-9 250.00, 250.01).   buPROPion 150 MG 12 hr tablet Commonly known as: WELLBUTRIN SR Take 1 tablet (150 mg total) by mouth 2 (two) times daily.   cefUROXime 250 MG tablet Commonly known as: Ceftin Take 1 tablet (250 mg total) by mouth 2 (two) times daily  for 5 days.   ferrous sulfate 325 (65 FE) MG tablet Take 1 tablet (325 mg total) by mouth daily with breakfast.   folic acid 1 MG tablet Commonly known as: FOLVITE Take 1 tablet (1 mg total) by mouth daily.   gabapentin 400 MG capsule Commonly known as: NEURONTIN Take 1 capsule (400 mg total) by mouth 3 (three) times daily.   glucose blood test strip Use as instructed   True Metrix Blood Glucose Test test strip Generic drug: glucose blood Use as instructed   hydrOXYzine 50 MG tablet Commonly known as: ATARAX/VISTARIL Take 1 tablet (50 mg total) by mouth 3 (three) times daily as needed for itching or anxiety.   insulin aspart 100 UNIT/ML injection Commonly known as:  novoLOG Inject 5-15 Units into the skin 3 (three) times daily before meals.   insulin glargine 100 UNIT/ML injection Commonly known as: LANTUS Inject 73 Units into the skin at bedtime.   Insulin Pen Needle 31G X 5 MM Misc Use with Lantus and Novolog injections.   Insulin Syringe-Needle U-100 31G X 5/16" 0.5 ML Misc Commonly known as: BD Insulin Syringe Ultrafine Use as directed   lip balm ointment Apply topically as needed for lip care.   loratadine 10 MG tablet Commonly known as: CLARITIN Take 1 tablet (10 mg total) by mouth daily as needed for allergies.   menthol-cetylpyridinium 3 MG lozenge Commonly known as: CEPACOL Take 1 lozenge (3 mg total) by mouth as needed for sore throat.   nicotine 14 mg/24hr patch Commonly known as: NICODERM CQ - dosed in mg/24 hours Place 1 patch (14 mg total) onto the skin daily.   nitroGLYCERIN 0.4 MG SL tablet Commonly known as: NITROSTAT Place 1 tablet (0.4 mg total) under the tongue every 5 (five) minutes as needed for chest pain.   omeprazole 20 MG capsule Commonly known as: PRILOSEC Take 1 capsule (20 mg total) by mouth 2 (two) times daily before a meal.   senna-docusate 8.6-50 MG tablet Commonly known as: Senokot-S Take 1 tablet by mouth 2 (two) times daily. What changed:   how much to take  when to take this   sertraline 50 MG tablet Commonly known as: Zoloft Take 1 tablet (50 mg total) by mouth daily. 1/2 tab daily x 2 wks then 1 tab PO daily   sucralfate 1 GM/10ML suspension Commonly known as: CARAFATE Take 10 mLs (1 g total) by mouth 4 (four) times daily -  with meals and at bedtime for 10 days.   True Metrix Meter w/Device Kit Use as directed   TRUEplus Lancets 28G Misc Use as directed   vitamin B-12 1000 MCG tablet Commonly known as: CYANOCOBALAMIN Take 2 tablets (2,000 mcg total) by mouth daily.      Follow-up Information    [facility] on 10/11/2019.   Why:  '@10'$ :50am Contact information: [place] [address] [phone]       Ladell Pier, MD Follow up in 1 week(s).   Specialty: Internal Medicine Contact information: [address] [place] [address] [phone]          Allergies  Allergen Reactions  . Ciprofloxacin Hcl Hives  . Dilaudid [Hydromorphone Hcl] Shortness Of Breath and Other (See Comments)    "Asystole," per pt report  . Macrobid [Nitrofurantoin Macrocrystal] Hives, Shortness Of Breath and Rash  . Other Hives, Shortness Of Breath and Rash    NO "-CILLINS"!!!  . Penicillins Shortness  Of Breath    Had had cephalosporins without incident Has patient had a PCN reaction causing immediate rash, facial/tongue/throat swelling, SOB or lightheadedness with hypotension: Yes Has patient had a PCN reaction causing severe rash involving mucus membranes or skin necrosis: Unk Has patient had a PCN reaction that required hospitalization: Unk Has patient had a PCN reaction occurring within the last 10 years: No If all of the above answers are "NO", then may proceed with Cephalosporin use.   . Sulfa Antibiotics Hives, Shortness Of Breath and Rash  . Lexapro [Escitalopram Oxalate] Other (See Comments)    "I just did not like it."    Consultations: GI   Procedures/Studies: CT ABDOMEN PELVIS WO CONTRAST  Result Date: 09/22/2019 CLINICAL DATA:  Chest pain. Abdominal pain. EXAM: CT ABDOMEN AND PELVIS WITHOUT CONTRAST TECHNIQUE: Multidetector CT imaging of the abdomen and pelvis was performed following the standard protocol without IV contrast. COMPARISON:  06/11/2019 FINDINGS: Lower chest: The lung bases are clear. The heart size is normal. Hepatobiliary: The liver is normal. The gallbladder is distended without definite evidence for acute cholecystitis.There is no biliary ductal dilation. Pancreas: Normal contours without ductal dilatation. No peripancreatic fluid collection. Spleen:  No splenic laceration or hematoma. Adrenals/Urinary Tract: --Adrenal glands: There is a stable left adrenal adenoma. --Right kidney/ureter: There are multiple nonobstructing stones in the lower pole the right kidney. There is minimal right-sided collecting system dilatation without evidence for an obstructing stone. The previously noted right ureteral stone has resolved. --Left kidney/ureter: The left kidney is somewhat atrophic in comparison to the right. Multiple nonobstructing stones are noted. There are several calcifications in the cortex. Pockets of gas are again noted and are relatively similar to prior study. There is no hydronephrosis. --Urinary bladder: Small amount of gas is noted within the nondependent portion of the urinary bladder. Stomach/Bowel: --Stomach/Duodenum: No hiatal hernia or other gastric abnormality. Normal duodenal course and caliber. --Small bowel: No dilatation or inflammation. --Colon: No focal abnormality. --Appendix: Normal. Vascular/Lymphatic: Atherosclerotic calcification is present within the non-aneurysmal abdominal aorta, without hemodynamically significant stenosis. A left common iliac artery stent noted. --No retroperitoneal lymphadenopathy. --No mesenteric lymphadenopathy. --No pelvic or inguinal lymphadenopathy. Reproductive: Unremarkable Other: No ascites or free air. There are postsurgical changes of the left inguinal region. Musculoskeletal. No acute displaced fractures. IMPRESSION: 1. No CT evidence for acute intra-abdominal or pelvic pathology. 2. Interval resolution of the previously noted right ureteral stone. 3. Bilateral nonobstructive nephrolithiasis. 4. Small amount of gas within the nondependent portion of the urinary bladder. Correlate with recent instrumentation. Again noted is gas within the left kidney, similar to prior study. This too is favored to be secondary to prior instrumentation. A gas-forming organism is not entirely excluded and should be correlated  with urinalysis. 5. Distended mild distention of the gallbladder without CT evidence for acute cholecystitis. Aortic Atherosclerosis (ICD10-I70.0). Electronically Signed   By: Constance Holster M.D.   On: 09/22/2019 19:22   DG Chest 2 View  Result Date: 09/22/2019 CLINICAL DATA:  Chest pain beginning yesterday. Vomiting. Hematemesis. EXAM: CHEST - 2 VIEW COMPARISON:  06/26/2018 FINDINGS: The heart size and mediastinal contours are within normal limits. Both lungs are clear. The visualized skeletal structures are unremarkable. IMPRESSION: Negative.  No active cardiopulmonary disease. Electronically Signed   By: Marlaine Hind M.D.   On: 09/22/2019 17:32   NM Pulmonary Perfusion  Result Date: 09/23/2019 CLINICAL DATA:  Chest pain. EXAM: NUCLEAR MEDICINE PERFUSION LUNG SCAN TECHNIQUE: Perfusion images were obtained in multiple projections after  intravenous injection of radiopharmaceutical. Ventilation scans intentionally deferred if perfusion scan and chest x-ray adequate for interpretation during COVID 19 epidemic. RADIOPHARMACEUTICALS:  1.48 mCi Tc-22mMAA IV COMPARISON:  Chest radiograph 8 hours ago FINDINGS: Homogeneous perfusion to both lungs.  No focal perfusion defect. IMPRESSION: No evidence of pulmonary embolus.  Normal perfusion scan. Electronically Signed   By: MKeith RakeM.D.   On: 09/23/2019 01:59   [place]Abdomen Limited  Result Date: 09/22/2019 CLINICAL DATA:  Right upper quadrant pain for 3 days. EXAM: ULTRASOUND ABDOMEN LIMITED RIGHT UPPER QUADRANT COMPARISON:  None. FINDINGS: Technically difficult exam due to patient habitus. Gallbladder: No gallstones or wall thickening visualized. No sonographic Murphy sign noted by sonographer. Common bile duct: Diameter: 3 mm, within normal limits. Liver: No focal lesion identified. Within normal limits in parenchymal echogenicity. Portal vein is patent on color Doppler imaging with normal direction of blood flow towards the liver. Other: None.  IMPRESSION: Negative.  No hepatobiliary abnormality identified. Electronically Signed   By: JMarlaine HindM.D.   On: 09/22/2019 17:09   ECHOCARDIOGRAM COMPLETE  Result Date: 09/23/2019   ECHOCARDIOGRAM REPORT   Patient Name:   Terri TENORIODate of Exam: 09/23/2019 Medical Rec #:  [id number]      Height:       67.0 in Accession #:    [id number]     Weight:       252.0 lb Date of Birth:  4September 28, 1977      BSA:          2.23 m Patient Age:    89years        BP:           135/88 mmHg Patient Gender: F               HR:           110 bpm. Exam Location:  Inpatient Procedure: 2D Echo Indications:    chest pain  History:        Patient has prior history of Echocardiogram examinations, most                 recent 06/26/2018. Chronic kidney disease.; Risk Factors:Diabetes                 and Hypertension.  Sonographer:    LJohny ChessReferring Phys: [id number] [place] 1. Left ventricular ejection fraction, by visual estimation, is 45 to 50%. The left ventricle has normal function. There is no left ventricular hypertrophy.  2. Left ventricular diastolic parameters are indeterminate.  3. The left ventricle demonstrates global hypokinesis.  4. Global right ventricle has normal systolic function.The right ventricular size is normal. No increase in right ventricular wall thickness.  5. Left atrial size was normal.  6. Right atrial size was normal.  7. The mitral valve is normal in structure. Trivial mitral valve regurgitation. No evidence of mitral stenosis.  8. The tricuspid valve is normal in structure. Tricuspid valve regurgitation is trivial.  9. The aortic valve is normal in structure. Aortic valve regurgitation is not visualized. No evidence of aortic valve sclerosis or stenosis. 10. The pulmonic valve was normal in structure. Pulmonic valve regurgitation is trivial. 11. TR signal is inadequate for assessing pulmonary artery systolic pressure. 12. The inferior vena cava is normal in size with  greater than 50% respiratory variability, suggesting right atrial pressure of 3 mmHg. FINDINGS  Left Ventricle: Left ventricular ejection fraction, by visual estimation,  is 45 to 50%. The left ventricle has normal function. The left ventricle demonstrates global hypokinesis. There is no left ventricular hypertrophy. Left ventricular diastolic parameters are indeterminate. Normal left atrial pressure. Right Ventricle: The right ventricular size is normal. No increase in right ventricular wall thickness. Global RV systolic function is has normal systolic function. Left Atrium: Left atrial size was normal in size. Right Atrium: Right atrial size was normal in size Pericardium: There is no evidence of pericardial effusion. Mitral Valve: The mitral valve is normal in structure. Trivial mitral valve regurgitation. No evidence of mitral valve stenosis by observation. Tricuspid Valve: The tricuspid valve is normal in structure. Tricuspid valve regurgitation is trivial. Aortic Valve: The aortic valve is normal in structure. Aortic valve regurgitation is not visualized. The aortic valve is structurally normal, with no evidence of sclerosis or stenosis. Pulmonic Valve: The pulmonic valve was normal in structure. Pulmonic valve regurgitation is trivial. Pulmonic regurgitation is trivial. Aorta: The aortic root, ascending aorta and aortic arch are all structurally normal, with no evidence of dilitation or obstruction. Venous: The inferior vena cava is normal in size with greater than 50% respiratory variability, suggesting right atrial pressure of 3 mmHg. IAS/Shunts: No atrial level shunt detected by color flow Doppler. There is no evidence of a patent foramen ovale. No ventricular septal defect is seen or detected. There is no evidence of an atrial septal defect.  LEFT VENTRICLE PLAX 2D LVIDd:         5.10 cm  Diastology LVIDs:         3.80 cm  LV e' lateral:   8.59 cm/s LV PW:         1.10 cm  LV E/e' lateral: 7.5 LV IVS:         0.90 cm LVOT diam:     2.30 cm LV SV:         62 ml LV SV Index:   26.01 LVOT Area:     4.15 cm  RIGHT VENTRICLE RV S prime:     19.70 cm/s TAPSE (M-mode): 2.7 cm LEFT ATRIUM             Index       RIGHT ATRIUM           Index LA diam:        3.00 cm 1.34 cm/m  RA Area:     12.50 cm LA Vol (A2C):   39.9 ml 17.88 ml/m RA Volume:   29.00 ml  13.00 ml/m LA Vol (A4C):   54.4 ml 24.38 ml/m LA Biplane Vol: 51.4 ml 23.04 ml/m  AORTIC VALVE LVOT Vmax:   97.30 cm/s LVOT Vmean:  57.700 cm/s LVOT VTI:    0.158 m  AORTA Ao Root diam: 3.00 cm MV E velocity: 64.30 cm/s 103 cm/s MV A velocity: 92.10 cm/s 70.3 cm/s SHUNTS MV E/A ratio:  0.70       1.5       Systemic VTI:  0.16 m                                     Systemic Diam: 2.30 cm  Cherlynn Kaiser MD Electronically signed by Cherlynn Kaiser MD Signature Date/Time: 09/23/2019/6:18:50 PM    Final      Discharge Exam: Vitals:   09/24/19 0431 09/24/19 0722  BP: 112/75 135/79  Pulse: 98 94  Resp: 18 18  Temp: 98.2  F (36.8 C) 98.3 F (36.8 C)  SpO2: (!) 88% 98%   Vitals:   09/24/19 0012 09/24/19 0044 09/24/19 0431 09/24/19 0722  BP: 118/70  112/75 135/79  Pulse: (!) 103  98 94  Resp: 18  18 18   Temp: 97.8 F (36.6 C)  98.2 F (36.8 C) 98.3 F (36.8 C)  TempSrc: Oral   Oral  SpO2: 99%  (!) 88% 98%  Weight:  120.3 kg    Height:        General: Pt is alert, awake, not in acute distress Cardiovascular: RRR, S1/S2 +, no rubs, no gallops Respiratory: CTA bilaterally, no wheezing, no rhonchi Abdominal: Soft, NT, ND, bowel sounds + Extremities: no edema, no cyanosis    The results of significant diagnostics from this hospitalization (including imaging, microbiology, ancillary and laboratory) are listed below for reference.     Microbiology: Recent Results (from the past 240 hour(s))  SARS CORONAVIRUS 2 (TAT 6-24 HRS) Nasopharyngeal Nasopharyngeal Swab     Status: None   Collection Time: 09/23/19  2:56 AM   Specimen: Nasopharyngeal  Swab  Result Value Ref Range Status   SARS Coronavirus 2 NEGATIVE NEGATIVE Final    Comment: (NOTE) SARS-CoV-2 target nucleic acids are NOT DETECTED. The SARS-CoV-2 RNA is generally detectable in upper and lower respiratory specimens during the acute phase of infection. Negative results do not preclude SARS-CoV-2 infection, do not rule out co-infections with other pathogens, and should not be used as the sole basis for treatment or other patient management decisions. Negative results must be combined with clinical observations, patient history, and epidemiological information. The expected result is Negative. Fact Sheet for Patients: SugarRoll.be Fact Sheet for Healthcare Providers: https://www.woods-mathews.com/ This test is not yet approved or cleared by the [place] FDA and  has been authorized for detection and/or diagnosis of SARS-CoV-2 by FDA under an Emergency Use Authorization (EUA). This EUA will remain  in effect (meaning this test can be used) for the duration of the COVID-19 declaration under Section 56 4(b)(1) of the Act, 21 U.S.C. section 360bbb-3(b)(1), unless the authorization is terminated or revoked sooner. Performed at [facility], [place] [address] [place], [place] [address]   Culture, blood (routine x 2)     Status: None (Preliminary result)   Collection Time: 09/23/19  2:58 AM   Specimen: BLOOD  Result Value Ref Range Status   Specimen Description BLOOD SITE NOT SPECIFIED  Final   Special Requests   Final    BOTTLES DRAWN AEROBIC AND ANAEROBIC Blood Culture adequate volume   Culture   Final    NO GROWTH 1 DAY Performed at [facility], [place] [address] [place], [place] [address]    Report Status PENDING  Incomplete  Culture, blood (routine x 2)     Status: None (Preliminary result)   Collection Time: 09/23/19  5:25 AM   Specimen: BLOOD LEFT ARM  Result Value Ref Range Status   Specimen Description  BLOOD LEFT ARM  Final   Special Requests   Final    BOTTLES DRAWN AEROBIC AND ANAEROBIC Blood Culture adequate volume PATIENT ON FOLLOWING ROCEPHIN   Culture   Final    NO GROWTH 1 DAY Performed at [facility], [place] [address] [place], [place] [address]    Report Status PENDING  Incomplete     Labs: BNP (last 3 results) No results for input(s): BNP in the last 8760 hours. Basic Metabolic Panel: Recent Labs  Lab 09/22/19  1550 09/23/19 0523 09/24/19 0831  NA 133* 137 135  K 3.7 3.5 3.5  CL 94* 104 105  CO2 25 23 23   GLUCOSE 256* 192* 130*  BUN 14 12 10   CREATININE 1.84* 1.63* 1.68*  CALCIUM 10.2 9.0 9.1  MG  --  1.8  --    Liver Function Tests: Recent Labs  Lab 09/22/19 1550 09/23/19 0523  AST 14* 11*  ALT 15 12  ALKPHOS 93 78  BILITOT 0.6 0.5  PROT 8.2* 7.0  ALBUMIN 4.2 3.4*   No results for input(s): LIPASE, AMYLASE in the last 168 hours. No results for input(s): AMMONIA in the last 168 hours. CBC: Recent Labs  Lab 09/22/19 1550 09/23/19 0523 09/24/19 0831  WBC 22.5* 18.9* 11.2*  NEUTROABS  --  15.5* 7.1  HGB 16.2* 14.5 12.8  HCT 50.4* 46.5* 40.4  MCV 93.0 95.7 95.1  PLT 408* 339 275   Cardiac Enzymes: No results for input(s): CKTOTAL, CKMB, CKMBINDEX, TROPONINI in the last 168 hours. BNP: Invalid input(s): POCBNP CBG: Recent Labs  Lab 09/23/19 1551 09/23/19 2006 09/24/19 0012 09/24/19 0427 09/24/19 0724  GLUCAP 216* 187* 141* 100* 100*   D-Dimer No results for input(s): DDIMER in the last 72 hours. Hgb A1c Recent Labs    09/23/19 0523  HGBA1C 8.6*   Lipid Profile No results for input(s): CHOL, HDL, LDLCALC, TRIG, CHOLHDL, LDLDIRECT in the last 72 hours. Thyroid function studies No results for input(s): TSH, T4TOTAL, T3FREE, THYROIDAB in the last 72 hours.  Invalid input(s): FREET3 Anemia work up No results for input(s): VITAMINB12, FOLATE, FERRITIN, TIBC, IRON, RETICCTPCT in the last 72 hours. Urinalysis    Component  Value Date/Time   COLORURINE YELLOW 09/22/2019 2135   APPEARANCEUR HAZY (A) 09/22/2019 2135   LABSPEC 1.011 09/22/2019 2135   PHURINE 6.0 09/22/2019 2135   GLUCOSEU 150 (A) 09/22/2019 2135   HGBUR MODERATE (A) 09/22/2019 2135   BILIRUBINUR NEGATIVE 09/22/2019 2135   BILIRUBINUR negative 04/19/2019 1206   BILIRUBINUR negative 09/29/2017 1317   KETONESUR 20 (A) 09/22/2019 2135   PROTEINUR 100 (A) 09/22/2019 2135   UROBILINOGEN 0.2 04/19/2019 1206   UROBILINOGEN 0.2 06/15/2018 1530   NITRITE NEGATIVE 09/22/2019 2135   LEUKOCYTESUR LARGE (A) 09/22/2019 2135   Sepsis Labs Invalid input(s): PROCALCITONIN,  WBC,  LACTICIDVEN Microbiology Recent Results (from the past 240 hour(s))  SARS CORONAVIRUS 2 (TAT 6-24 HRS) Nasopharyngeal Nasopharyngeal Swab     Status: None   Collection Time: 09/23/19  2:56 AM   Specimen: Nasopharyngeal Swab  Result Value Ref Range Status   SARS Coronavirus 2 NEGATIVE NEGATIVE Final    Comment: (NOTE) SARS-CoV-2 target nucleic acids are NOT DETECTED. The SARS-CoV-2 RNA is generally detectable in upper and lower respiratory specimens during the acute phase of infection. Negative results do not preclude SARS-CoV-2 infection, do not rule out co-infections with other pathogens, and should not be used as the sole basis for treatment or other patient management decisions. Negative results must be combined with clinical observations, patient history, and epidemiological information. The expected result is Negative. Fact Sheet for Patients: SugarRoll.be Fact Sheet for Healthcare Providers: https://www.woods-mathews.com/ This test is not yet approved or cleared by the [place] FDA and  has been authorized for detection and/or diagnosis of SARS-CoV-2 by FDA under an Emergency Use Authorization (EUA). This EUA will remain  in effect (meaning this test can be used) for the duration of the COVID-19 declaration under Section  56 4(b)(1) of the Act, 21 U.S.C.  section 360bbb-3(b)(1), unless the authorization is terminated or revoked sooner. Performed at [facility], [place] [address] [place], [place] [address]   Culture, blood (routine x 2)     Status: None (Preliminary result)   Collection Time: 09/23/19  2:58 AM   Specimen: BLOOD  Result Value Ref Range Status   Specimen Description BLOOD SITE NOT SPECIFIED  Final   Special Requests   Final    BOTTLES DRAWN AEROBIC AND ANAEROBIC Blood Culture adequate volume   Culture   Final    NO GROWTH 1 DAY Performed at [facility], [place] [address] [place], [place] [address]    Report Status PENDING  Incomplete  Culture, blood (routine x 2)     Status: None (Preliminary result)   Collection Time: 09/23/19  5:25 AM   Specimen: BLOOD LEFT ARM  Result Value Ref Range Status   Specimen Description BLOOD LEFT ARM  Final   Special Requests   Final    BOTTLES DRAWN AEROBIC AND ANAEROBIC Blood Culture adequate volume PATIENT ON FOLLOWING ROCEPHIN   Culture   Final    NO GROWTH 1 DAY Performed at [facility], [place] [address] [place], [place] [address]    Report Status PENDING  Incomplete     Time coordinating discharge: Over 30 minutes  SIGNED:   Darliss Cheney, MD  [facility] 09/24/2019, 10:31 AM  If 7PM-7AM, please contact night-coverage [email or web address] Password [email or web address]

## 2019-09-24 NOTE — Progress Notes (Signed)
Terri Sparks 11:02 AM  Subjective: Patient is doing much better tolerating soft solids no signs of bleeding no new complaints supposed to be on pump inhibitors but has not gotten her medicine for 2 months and her case discussed with my partner Dr. Therisa Doyne in her hospital computer chart reviewed  Objective: Vital signs stable afebrile no acute distress abdomen is soft nontender hemoglobin okay BUN okay  Assessment: Subacute GI blood loss  Plan: Please call us back if we could be of any further assistance otherwise continue pump inhibitors as an outpatient and reevaluate whether she truly needs aspirin and blood thinners going forward  [facility]  office [phone] After 5PM or if no answer call [phone]

## 2019-09-24 NOTE — Discharge Instructions (Signed)
Apixaban oral tablets What is this medicine? APIXABAN (a PIX a ban) is an anticoagulant (blood thinner). It is used to lower the chance of stroke in people with a medical condition called atrial fibrillation. It is also used to treat or prevent blood clots in the lungs or in the veins. This medicine may be used for other purposes; ask your health care provider or pharmacist if you have questions. COMMON BRAND NAME(S): Eliquis What should I tell my health care provider before I take this medicine? They need to know if you have any of these conditions:  antiphospholipid antibody syndrome  bleeding disorders  bleeding in the brain  blood in your stools (black or tarry stools) or if you have blood in your vomit  history of blood clots  history of stomach bleeding  kidney disease  liver disease  mechanical heart valve  an unusual or allergic reaction to apixaban, other medicines, foods, dyes, or preservatives  pregnant or trying to get pregnant  breast-feeding How should I use this medicine? Take this medicine by mouth with a glass of water. Follow the directions on the prescription label. You can take it with or without food. If it upsets your stomach, take it with food. Take your medicine at regular intervals. Do not take it more often than directed. Do not stop taking except on your doctor's advice. Stopping this medicine may increase your risk of a blood clot. Be sure to refill your prescription before you run out of medicine. Talk to your pediatrician regarding the use of this medicine in children. Special care may be needed. Overdosage: If you think you have taken too much of this medicine contact a poison control center or emergency room at once. NOTE: This medicine is only for you. Do not share this medicine with others. What if I miss a dose? If you miss a dose, take it as soon as you can. If it is almost time for your next dose, take only that dose. Do not take double or  extra doses. What may interact with this medicine? This medicine may interact with the following:  aspirin and aspirin-like medicines  certain medicines for fungal infections like ketoconazole and itraconazole  certain medicines for seizures like carbamazepine and phenytoin  certain medicines that treat or prevent blood clots like warfarin, enoxaparin, and dalteparin  clarithromycin  NSAIDs, medicines for pain and inflammation, like ibuprofen or naproxen  rifampin  ritonavir  St. John's wort This list may not describe all possible interactions. Give your health care provider a list of all the medicines, herbs, non-prescription drugs, or dietary supplements you use. Also tell them if you smoke, drink alcohol, or use illegal drugs. Some items may interact with your medicine. What should I watch for while using this medicine? Visit your healthcare professional for regular checks on your progress. You may need blood work done while you are taking this medicine. Your condition will be monitored carefully while you are receiving this medicine. It is important not to miss any appointments. Avoid sports and activities that might cause injury while you are using this medicine. Severe falls or injuries can cause unseen bleeding. Be careful when using sharp tools or knives. Consider using an electric razor. Take special care brushing or flossing your teeth. Report any injuries, bruising, or red spots on the skin to your healthcare professional. If you are going to need surgery or other procedure, tell your healthcare professional that you are taking this medicine. Wear a medical ID bracelet   or chain. Carry a card that describes your disease and details of your medicine and dosage times. What side effects may I notice from receiving this medicine? Side effects that you should report to your doctor or health care professional as soon as possible:  allergic reactions like skin rash, itching or hives,  swelling of the face, lips, or tongue  signs and symptoms of bleeding such as bloody or black, tarry stools; red or dark-brown urine; spitting up blood or brown material that looks like coffee grounds; red spots on the skin; unusual bruising or bleeding from the eye, gums, or nose  signs and symptoms of a blood clot such as chest pain; shortness of breath; pain, swelling, or warmth in the leg  signs and symptoms of a stroke such as changes in vision; confusion; trouble speaking or understanding; severe headaches; sudden numbness or weakness of the face, arm or leg; trouble walking; dizziness; loss of coordination This list may not describe all possible side effects. Call your doctor for medical advice about side effects. You may report side effects to FDA at 1-800-FDA-1088. Where should I keep my medicine? Keep out of the reach of children. Store at room temperature between 20 and 25 degrees C (68 and 77 degrees F). Throw away any unused medicine after the expiration date. NOTE: This sheet is a summary. It may not cover all possible information. If you have questions about this medicine, talk to your doctor, pharmacist, or health care provider.  2020 Elsevier/Gold Standard (2018-05-31 17:39:34)     Information on my medicine - ELIQUIS (apixaban)  Why was Eliquis prescribed for you? Eliquis was prescribed for you to reduce the risk of forming blood clots that can cause a stroke if you have a medical condition called atrial fibrillation (a type of irregular heartbeat) OR to reduce the risk of a blood clots.  What do You need to know about Eliquis ? Take your Eliquis TWICE DAILY - one tablet in the morning and one tablet in the evening with or without food.  It would be best to take the doses about the same time each day.  If you have difficulty swallowing the tablet whole please discuss with your pharmacist how to take the medication safely.  Take Eliquis exactly as prescribed by your  doctor and DO NOT stop taking Eliquis without talking to the doctor who prescribed the medication.  Stopping may increase your risk of developing a new clot or stroke.  Refill your prescription before you run out.  After discharge, you should have regular check-up appointments with your healthcare provider that is prescribing your Eliquis.  In the future your dose may need to be changed if your kidney function or weight changes by a significant amount or as you get older.  What do you do if you miss a dose? If you miss a dose, take it as soon as you remember on the same day and resume taking twice daily.  Do not take more than one dose of ELIQUIS at the same time.  Important Safety Information A possible side effect of Eliquis is bleeding. You should call your healthcare provider right away if you experience any of the following: ? Bleeding from an injury or your nose that does not stop. ? Unusual colored urine (red or dark brown) or unusual colored stools (red or black). ? Unusual bruising for unknown reasons. ? A serious fall or if you hit your head (even if there is no bleeding).  Some medicines  may interact with Eliquis and might increase your risk of bleeding or clotting while on Eliquis. To help avoid this, consult your healthcare provider or pharmacist prior to using any new prescription or non-prescription medications, including herbals, vitamins, non-steroidal anti-inflammatory drugs (NSAIDs) and supplements.  This website has more information on Eliquis (apixaban): [email or web address].

## 2019-09-24 NOTE — TOC Transition Note (Signed)
Transition of Care [facility]) - CM/SW Discharge Note   Patient Details  Name: Terri Sparks MRN: [id number] Date of Birth: 1976/08/30  [facility] CM/SW Contact:  Zenon Mayo, RN Phone Number: 09/24/2019, 10:31 AM   Clinical Narrative:    Patient is for dc today, she will be on eliquis, TOC pharmacy will fill for first 30 days free and NCM will assist her with Match for her other meds.  She states she may need transportation, NCM  Informed her that we can assist her with transport if needed.  She has follow up apt at the [facility] clinic.   Final next level of care: Home/Self Care Barriers to Discharge: No Barriers Identified   Patient Goals and CMS Choice        Discharge Placement                       Discharge Plan and Services                                     Social Determinants of Health (SDOH) Interventions     Readmission Risk Interventions Readmission Risk Prevention Plan 05/19/2018  Transportation Screening Complete  Home Care Screening Complete  HRI or Home Care Consult Complete  Medication Review (RN Care Manager) Complete  Some recent data might be hidden

## 2019-09-25 ENCOUNTER — Telehealth: Payer: Self-pay

## 2019-09-25 NOTE — Telephone Encounter (Signed)
Transition Care Management Follow-up Telephone Call Date of discharge and from where: 09/24/2019, [facility].  Call placed to patient # [phone], the message stated that the call could not be completed at this time.  Call placed to # [phone] and the voicemail is full.  Patient has an appointment scheduled with Dr Wynetta Emery 10/11/2019

## 2019-09-26 ENCOUNTER — Other Ambulatory Visit: Payer: Self-pay

## 2019-09-26 ENCOUNTER — Telehealth: Payer: Self-pay

## 2019-09-26 ENCOUNTER — Ambulatory Visit (HOSPITAL_COMMUNITY)
Admission: RE | Admit: 2019-09-26 | Discharge: 2019-09-26 | Disposition: A | Discharge status: Home / Self Care | Payer: Medicaid Other | Source: Ambulatory Visit | Attending: Obstetrics and Gynecology | Admitting: Obstetrics and Gynecology

## 2019-09-26 DIAGNOSIS — N939 Abnormal uterine and vaginal bleeding, unspecified: Secondary | ICD-10-CM | POA: Diagnosis not present

## 2019-09-26 NOTE — Telephone Encounter (Signed)
Transition Care Management Follow-up Telephone Call Date of discharge and from where: 09/24/2019, [facility].  Call placed to patient [phone] and the message states that the call can not be completed at this time, Call plcaed to # [phone] and the voicemail is full.    The patient has an appointment with Dr Wynetta Emery on 10/11/2019

## 2019-09-28 LAB — CULTURE, BLOOD (ROUTINE X 2)
Culture: NO GROWTH
Culture: NO GROWTH
Special Requests: ADEQUATE

## 2019-10-03 ENCOUNTER — Ambulatory Visit: Payer: Self-pay | Admitting: Cardiovascular Disease

## 2019-10-11 ENCOUNTER — Ambulatory Visit: Payer: Self-pay | Attending: Internal Medicine | Admitting: Internal Medicine

## 2019-10-11 ENCOUNTER — Other Ambulatory Visit: Payer: Self-pay

## 2019-10-17 ENCOUNTER — Other Ambulatory Visit: Payer: Self-pay

## 2019-10-17 ENCOUNTER — Ambulatory Visit (INDEPENDENT_AMBULATORY_CARE_PROVIDER_SITE_OTHER): Payer: Medicaid Other | Admitting: Obstetrics and Gynecology

## 2019-10-17 ENCOUNTER — Encounter: Payer: Self-pay | Admitting: Obstetrics and Gynecology

## 2019-10-17 VITALS — BP 129/85 | HR 92 | Ht 67.0 in | Wt 263.7 lb

## 2019-10-17 DIAGNOSIS — N939 Abnormal uterine and vaginal bleeding, unspecified: Secondary | ICD-10-CM

## 2019-10-17 MED ORDER — MEGESTROL ACETATE 40 MG PO TABS: 20.0000 mg | ORAL_TABLET | Freq: Two times a day (BID) | ORAL | 5 refills | Status: DC

## 2019-10-17 MED FILL — MEGESTROL 40 MG TABLET: 40 | 30 days supply | Qty: 120 | Fill #0

## 2019-10-17 NOTE — Progress Notes (Signed)
Terri Sparks presents for follow up from 08/23/19 visit. U/S normal.  EMBX normal  PE AF VSS Lungs clear  Heart RRR Abd soft + BS  A/P AUB        Multiple medical problems as noted in medical records.  Pt made aware elective surgeries have been postponed for now. Do not feel pt is a candidate for outpt surgery center due to her medical history. Tx options for the mean time until surgery reviewed. Pt desires [facility] After discussion will restart Megace.  F/U in 3 months or sooner if needed. Will need pre op clearance prior to surgery. Will not order until surgery can be scheduled

## 2019-10-23 ENCOUNTER — Ambulatory Visit: Payer: Self-pay | Admitting: Cardiovascular Disease

## 2019-11-07 ENCOUNTER — Telehealth: Payer: Self-pay | Admitting: Internal Medicine

## 2019-11-07 MED FILL — FOLIC ACID 1 MG TABS: 1 | 30 days supply | Qty: 30 | Fill #0

## 2019-11-07 MED FILL — BUPROPION SR 150 MG TABLET: 150 | 30 days supply | Qty: 60 | Fill #0

## 2019-11-07 MED FILL — GABAPENTIN 400 MG CAPSULE: 400 | 30 days supply | Qty: 90 | Fill #0

## 2019-11-07 MED FILL — WARFARIN SODIUM 5 MG TABLET: 5 | 42 days supply | Qty: 60 | Fill #2

## 2019-11-07 MED FILL — FERROUS SULFATE 325 MG TAB: 325 (65 FE) | 30 days supply | Qty: 30 | Fill #0

## 2019-11-07 MED FILL — AMLODIPINE BESYLATE 5 MG TA: 5 | 30 days supply | Qty: 30 | Fill #0

## 2019-11-07 MED FILL — ?OMEPRAZOLE 20MG CAP DR: 20 | 30 days supply | Qty: 60 | Fill #0

## 2019-11-07 MED FILL — SERTRALINE HCL 50 MG TABS: 50 | 30 days supply | Qty: 30 | Fill #0

## 2019-11-07 MED FILL — hydrOXYzine HCL 25 MG TABS: 25 | 30 days supply | Qty: 180 | Fill #0

## 2019-11-07 MED FILL — hydrOXYzine HCL 50 MG TABS: 50 | 30 days supply | Qty: 90 | Fill #1

## 2019-11-07 NOTE — Telephone Encounter (Signed)
That approval will have to come from Dr. Wynetta Emery.

## 2019-11-07 NOTE — Telephone Encounter (Signed)
Patient called and stated that she does not like the Eliquis and wanted to know if she could be put back on Warfarin. If so please send to [facility] pharmacy.

## 2019-11-09 ENCOUNTER — Telehealth: Payer: Self-pay | Admitting: Pharmacist

## 2019-11-09 NOTE — Telephone Encounter (Signed)
I attempted to connect with Ms. Gabrielsen using the listed contact numbers in [facility]. Was unable to do so. I called her contact, [facility], and connected with her successfully. Pt contact informed me that Ms. Asay's number was "cut off", but she would relay a message to her as she is supposed to see her this weekend. I gave the patient's contact my name and information, including the purpose of the call. I left HIPAA-compliant instructions for Ms. Dijulio to return our call as soon as possible. Her contact agreed to give Ms. Possehl the message.   Benard Halsted, PharmD, [place] [phone]

## 2019-11-09 NOTE — Telephone Encounter (Signed)
Will attempt to reach her later today.

## 2019-11-12 ENCOUNTER — Telehealth (INDEPENDENT_AMBULATORY_CARE_PROVIDER_SITE_OTHER): Payer: Self-pay | Admitting: Lactation Services

## 2019-11-12 ENCOUNTER — Telehealth: Payer: Self-pay | Admitting: Obstetrics and Gynecology

## 2019-11-12 ENCOUNTER — Telehealth: Payer: Self-pay | Admitting: Obstetrics & Gynecology

## 2019-11-12 ENCOUNTER — Other Ambulatory Visit: Payer: Self-pay | Admitting: Pharmacist

## 2019-11-12 DIAGNOSIS — N939 Abnormal uterine and vaginal bleeding, unspecified: Secondary | ICD-10-CM

## 2019-11-12 MED ORDER — APIXABAN 5 MG PO TABS: 5.0000 mg | ORAL_TABLET | Freq: Two times a day (BID) | ORAL | 2 refills | Status: DC

## 2019-11-12 MED FILL — NovoLOG 100 UNIT/ML SOLN: 100 | 15 days supply | Qty: 10 | Fill #0

## 2019-11-12 MED FILL — !LANTUS 100 UNITS/ML VIAL: 100 | 26 days supply | Qty: 20 | Fill #0

## 2019-11-12 NOTE — Telephone Encounter (Addendum)
Called pt in regards to her concerns. Pt was seen on the office with follow up to be in 3 months or sooner if needed. Will reach out to Dr. Rip Harbour to see if pt is ready to be scheduled for TAH or to seek clearance for surgery and will call pt back.   Pt has had Enbx and [name] completed previously.

## 2019-11-12 NOTE — Telephone Encounter (Signed)
Called and informed pt that we have spoken with Dr. Rip Harbour and that Pt is to come in for appt on 3/3 for Surgical consult. Pt voiced understanding.   Pt reports she was taking Megace some and then stopped taking it. She reports the cramps are unbearable and the bleeding picked up and then lightened up when she stopped the Megace. The cramps are severe whether she is taking the Megace or not. She has tried heat, hot showers, Ibuprofen, Aleve, and Tylenol does not seem to help. She would like to know what else she can try.   Pt with a lot of questions about the surgery, informed her she can discuss with Dr. Rip Harbour at her appt on 3/3. Pt voiced understanding.

## 2019-11-12 NOTE — Telephone Encounter (Signed)
Patient called in stating she wanted to know what her appointment on 3/3 was for. Patient was instructed that is was to discuss with Dr. Rip Harbour her u/s results and to have an EMBX done. Patient stated that she has already had these things so she wants to know why she has to have them done again. Patient stated that she has been going through this for about a year now and wants to know when her surgery will be because she is getting a bit discouraged now. Patient instructed that I will rely this information to the nurses and they will reach out to Dr. Rip Harbour and after this will give her a call with the information. Patient verbalized information. Message sent to clinical pool.

## 2019-11-12 NOTE — Telephone Encounter (Signed)
Called the patient to inform of the upcoming visit. Left the patient a voicemail informing of the scheduled date and time. If she has any questions please give our office a call back. Also mailing an appointment reminder.

## 2019-11-13 ENCOUNTER — Other Ambulatory Visit: Payer: Self-pay

## 2019-11-13 ENCOUNTER — Ambulatory Visit: Payer: Medicaid Other | Attending: Internal Medicine | Admitting: Internal Medicine

## 2019-11-13 DIAGNOSIS — IMO0002 Reserved for concepts with insufficient information to code with codable children: Secondary | ICD-10-CM

## 2019-11-13 DIAGNOSIS — E1165 Type 2 diabetes mellitus with hyperglycemia: Secondary | ICD-10-CM

## 2019-11-13 DIAGNOSIS — R079 Chest pain, unspecified: Secondary | ICD-10-CM

## 2019-11-13 DIAGNOSIS — Z5900 Homelessness unspecified: Secondary | ICD-10-CM | POA: Insufficient documentation

## 2019-11-13 DIAGNOSIS — R000 Tachycardia, unspecified: Secondary | ICD-10-CM

## 2019-11-13 DIAGNOSIS — R0789 Other chest pain: Secondary | ICD-10-CM

## 2019-11-13 DIAGNOSIS — E1121 Type 2 diabetes mellitus with diabetic nephropathy: Secondary | ICD-10-CM

## 2019-11-13 DIAGNOSIS — Z6379 Other stressful life events affecting family and household: Secondary | ICD-10-CM

## 2019-11-13 DIAGNOSIS — F1910 Other psychoactive substance abuse, uncomplicated: Secondary | ICD-10-CM

## 2019-11-13 MED ORDER — TRUEPLUS LANCETS 28G MISC: 4 refills | Status: DC

## 2019-11-13 MED ORDER — SUCRALFATE 1 GM/10ML PO SUSP: 1.0000 g | Freq: Two times a day (BID) | ORAL | 1 refills | Status: DC

## 2019-11-13 MED ORDER — TRUE METRIX BLOOD GLUCOSE TEST VI STRP: ORAL_STRIP | 12 refills | Status: DC

## 2019-11-13 MED ORDER — TRUE METRIX METER W/DEVICE KIT: PACK | 0 refills | Status: DC

## 2019-11-13 MED FILL — !ELIQUIS 5 MG TABLET: 5 | 30 days supply | Qty: 60 | Fill #0

## 2019-11-13 NOTE — Progress Notes (Signed)
Pt is needing a referral to hematology to see what type or blood disorder she has    Pt states she is having a lot of pain in her right kidney

## 2019-11-13 NOTE — Progress Notes (Signed)
Virtual Visit via Telephone Note Due to current restrictions/limitations of in-office visits due to the COVID-19 pandemic, this scheduled clinical appointment was converted to a telehealth visit  I connected with Terri Sparks on 11/13/19 at 3:50 p.m by telephone and verified that I am speaking with the correct person using two identifiers. I am in my office.  The patient is at home.  Only the patient and myself participated in this encounter.  I discussed the limitations, risks, security and privacy concerns of performing an evaluation and management service by telephone and the availability of in person appointments. I also discussed with the patient that there may be a patient responsible charge related to this service. The patient expressed understanding and agreed to proceed.   History of Present Illness: history ofDM type2 with macroalbumin, CKD 3,HTN,tob dep,PVDwith hx LLEacute limb ischemia requiring stent to the iliac artery and embolectomy followed by Dr. Zenia Resides vascular surgery, hyperparathyroidism s/p resection of parathryroid adenoma 2010, GERD,IDA,B12 def/folate def anxiety/dep.  Patient was last seen 07/2019.  Since last visit, she was hospitalized 09/2019 with sepsis secondary to UTI, chest pain and abdominal pain thought likely to be due to acute gastric issue.  She was placed on PPI and Carafate with resolution of symptoms.  CAT scan of abdomen was negative for any acute findings, right upper quadrant ultrasound was negative.  VQ scan was normal.  Urine drug screen positive for opioids, cocaine and THC.  Found to be tachycardic which was thought to be due to cocaine use.  Coumadin changed to Eliquis on hosp dischg  Patient had called several days ago requesting to be put back on Coumadin instead of the Eliquis.  She had spoken with the clinical pharmacist who does the anticoagulation clinic.  She states she got a better understanding of Eliquis and has decided to stay on  that instead.   -Would like referral to hematologist.  Recently learned from her aunt that her father, her paternal GM and paternal uncle all had an inherited clotting disorder which they all died from. She is uninsured but hopes to get disability and Medicare soon.   She has not seen urologist since fall of last yr. last stent that was placed she states fell out on its own.  Last CAT scan of the abdomen done 09/2019 revealed multiple nonobstructing stones.  There were pockets of gas again noted and similar to prior study.  There was no hydronephrosis.  DM:  Needs a new meter. Reports compliance with Lantus 70 units daily and Novolog about 10 units with meals She tries to avoid foods that elevate blood sugars -Degree of microalbuminuria and decreased from 674 down to 225. Lab Results  Component Value Date   HGBA1C 8.6 (H) 09/23/2019    Polysubst abuse:  Pt admits to [facility] use but denies use of cocaine. Thinks it may have been in the blunt that she rolls herself but states she is not actively using cocaine. Reports being under a lot of stress.  She is basically homeless, no telephone #.  Can not stay with her daughter, "its just too stressful so I had to remove myself from the situation."  Currently staying with a friend in a hotel. -some days she has to take a extra Hydroxyzine to control the anxiety  Has appt with Dr. Gwenlyn Found next Tuesday for tachycardia, intermittent CP and echo with reduced EF 45-50%   Outpatient Encounter Medications as of 11/13/2019  Medication Sig  . acetaminophen (TYLENOL) 500 MG tablet Take  2,000 mg by mouth daily as needed (for headaches).   Marland Kitchen albuterol (PROAIR HFA) 108 (90 Base) MCG/ACT inhaler Inhale 2 puffs into the lungs every 6 (six) hours as needed for wheezing or shortness of breath.  Marland Kitchen amLODipine (NORVASC) 5 MG tablet Take 1 tablet (5 mg total) by mouth daily.  Marland Kitchen apixaban (ELIQUIS) 5 MG TABS tablet Take 1 tablet (5 mg total) by mouth 2 (two) times daily.  Marland Kitchen  aspirin EC 81 MG tablet Take 1 tablet (81 mg total) by mouth daily.  . blood glucose meter kit and supplies Dispense based on patient and insurance preference. Use up to four times daily as directed. (FOR ICD-9 250.00, 250.01).  . Blood Glucose Monitoring Suppl (TRUE METRIX METER) w/Device KIT Use as directed  . buPROPion (WELLBUTRIN SR) 150 MG 12 hr tablet Take 1 tablet (150 mg total) by mouth 2 (two) times daily.  . ferrous sulfate 325 (65 FE) MG tablet Take 1 tablet (325 mg total) by mouth daily with breakfast.  . folic acid (FOLVITE) 1 MG tablet Take 1 tablet (1 mg total) by mouth daily.  Marland Kitchen gabapentin (NEURONTIN) 400 MG capsule Take 1 capsule (400 mg total) by mouth 3 (three) times daily.  Marland Kitchen glucose blood (TRUE METRIX BLOOD GLUCOSE TEST) test strip Use as instructed  . glucose blood test strip Use as instructed  . hydrOXYzine (ATARAX/VISTARIL) 50 MG tablet Take 1 tablet (50 mg total) by mouth 3 (three) times daily as needed for itching or anxiety.  . insulin aspart (NOVOLOG) 100 UNIT/ML injection Inject 5-15 Units into the skin 3 (three) times daily before meals.  . insulin glargine (LANTUS) 100 UNIT/ML injection Inject 0.73 mLs (73 Units total) into the skin at bedtime.  . Insulin Pen Needle 31G X 5 MM MISC Use with Lantus and Novolog injections.  . Insulin Syringe-Needle U-100 (BD INSULIN SYRINGE ULTRAFINE) 31G X 5/16" 0.5 ML MISC Use as directed  . Insulin Syringes, Disposable, U-100 1 ML MISC Use as directed  . lip balm (CARMEX) ointment Apply topically as needed for lip care. (Patient not taking: Reported on 09/23/2019)  . loratadine (CLARITIN) 10 MG tablet Take 1 tablet (10 mg total) by mouth daily as needed for allergies.  . megestrol (MEGACE) 40 MG tablet Take 0.5 tablets (20 mg total) by mouth 2 (two) times daily. Can increase to two tablets twice a day in the event of heavy bleeding  . menthol-cetylpyridinium (CEPACOL) 3 MG lozenge Take 1 lozenge (3 mg total) by mouth as needed for  sore throat. (Patient not taking: Reported on 09/23/2019)  . nitroGLYCERIN (NITROSTAT) 0.4 MG SL tablet Place 1 tablet (0.4 mg total) under the tongue every 5 (five) minutes as needed for chest pain.  Marland Kitchen omeprazole (PRILOSEC) 20 MG capsule Take 1 capsule (20 mg total) by mouth 2 (two) times daily before a meal.  . senna-docusate (SENOKOT-S) 8.6-50 MG tablet Take 1 tablet by mouth 2 (two) times daily.  . sertraline (ZOLOFT) 50 MG tablet Take 1 tablet (50 mg total) by mouth daily. 1/2 tab daily x 2 wks then 1 tab PO daily  . sucralfate (CARAFATE) 1 GM/10ML suspension Take 10 mLs (1 g total) by mouth 4 (four) times daily -  with meals and at bedtime for 10 days.  . TRUEPLUS LANCETS 28G MISC Use as directed  . vitamin B-12 (CYANOCOBALAMIN) 1000 MCG tablet Take 2 tablets (2,000 mcg total) by mouth daily.   No facility-administered encounter medications on file as of 11/13/2019.  Observations/Objective: Lab Results  Component Value Date   WBC 11.2 (H) 09/24/2019   HGB 12.8 09/24/2019   HCT 40.4 09/24/2019   MCV 95.1 09/24/2019   PLT 275 09/24/2019     Chemistry      Component Value Date/Time   NA 135 09/24/2019 0831   NA 137 07/25/2018 1711   K 3.5 09/24/2019 0831   CL 105 09/24/2019 0831   CO2 23 09/24/2019 0831   BUN 10 09/24/2019 0831   BUN 26 (H) 07/25/2018 1711   CREATININE 1.68 (H) 09/24/2019 0831      Component Value Date/Time   CALCIUM 9.1 09/24/2019 0831   ALKPHOS 78 09/23/2019 0523   AST 11 (L) 09/23/2019 0523   ALT 12 09/23/2019 0523   BILITOT 0.5 09/23/2019 0523   BILITOT <0.2 01/27/2018 1157     Lab Results  Component Value Date   CHOL 84 06/27/2018   HDL 13 (L) 06/27/2018   LDLCALC 24 06/27/2018   TRIG 233 (H) 06/27/2018   CHOLHDL 6.5 06/27/2018     Assessment and Plan: 1. 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  Prescription sent to the pharmacy for glucometer and other  testing supplies.  Encouraged to check blood sugars at least once a day.  Healthy eating habits discussed. - glucose blood (TRUE METRIX BLOOD GLUCOSE TEST) test strip; Use as instructed  Dispense: 100 each; Refill: 12 - Blood Glucose Monitoring Suppl (TRUE METRIX METER) w/Device KIT; Use as directed  Dispense: 1 kit; Refill: 0 - TRUEplus Lancets 28G MISC; Use as directed  Dispense: 100 each; Refill: 4  2. Homeless 3. Stressful life event affecting family We will have our LCSW follow-up with our to inform of community resources that may be available to help her.  4. Polysubstance abuse ([place]) Strongly encourage her to discontinue use of street drugs.  She denies cocaine use but I think cocaine found in the urine on more than one occasion it is unlikely to be an error  5. Tachycardia 6. Chest pain in adult Keep upcoming appointment with cardiology.  7.  Once she has the orange card at [employer] discount card or Medicare, we can refer to hematology for further evaluation given her history of recurrent acute emboli and family history of embolic disease   Follow Up Instructions: 1 mth   I discussed the assessment and treatment plan with the patient. The patient was provided an opportunity to ask questions and all were answered. The patient agreed with the plan and demonstrated an understanding of the instructions.   The patient was advised to call back or seek an in-person evaluation if the symptoms worsen or if the condition fails to improve as anticipated.  I provided 23 minutes of non-face-to-face time during this encounter.   Karle Plumber, MD

## 2019-11-14 MED FILL — TRUEplus LANCETS 28G MISC: 30 days supply | Qty: 100 | Fill #0

## 2019-11-14 MED FILL — TRUE METRIX TEST STRIP: 30 days supply | Qty: 100 | Fill #0

## 2019-11-14 MED FILL — !TRUE METRIX BLOOD GLUCOSE: 30 days supply | Qty: 1 | Fill #0

## 2019-11-14 MED FILL — CARAFATE 1 GM/10 ML SUSP: 1 | 10 days supply | Qty: 200 | Fill #0

## 2019-11-19 ENCOUNTER — Telehealth: Payer: Self-pay

## 2019-11-19 DIAGNOSIS — N939 Abnormal uterine and vaginal bleeding, unspecified: Secondary | ICD-10-CM

## 2019-11-19 DIAGNOSIS — N946 Dysmenorrhea, unspecified: Secondary | ICD-10-CM

## 2019-11-19 NOTE — Telephone Encounter (Signed)
Pt left second message @ 1421 stating that this is her second cycle in 2 weeks and is having bad cramps. She requests a call back.

## 2019-11-19 NOTE — Telephone Encounter (Signed)
Pt called requesting something to be prescribed her cramps.  Ibuprofen is not effective.

## 2019-11-20 ENCOUNTER — Ambulatory Visit: Payer: Self-pay | Admitting: Cardiovascular Disease

## 2019-11-20 MED ORDER — NORETHINDRONE ACETATE 5 MG PO TABS: 10.0000 mg | ORAL_TABLET | Freq: Four times a day (QID) | ORAL | 0 refills | Status: DC

## 2019-11-20 MED ORDER — IBUPROFEN 800 MG PO TABS: 800.0000 mg | ORAL_TABLET | Freq: Three times a day (TID) | ORAL | 1 refills | Status: DC | PRN

## 2019-11-20 NOTE — Telephone Encounter (Signed)
Pt left VM on nurse line stating she in unable to afford  Aygestin that was prescribed this AM. Requesting a call back.

## 2019-11-20 NOTE — Telephone Encounter (Signed)
Pt called front office this morning, call transferred to clinical staff. Pt reports period began on Sunday 11/18/19. Reports passing clots and changing pad every 30 minutes to 1 hour. Pt is currently taking 800 mg ibuprofen for the pain with no improvement. No longer taking Megace as it was not improving the bleeding.  Reviewed pt's symptoms and history with Ilda Basset, MD who gives a verbal order for Aygestin 10 mg every 6 hours, decrease to twice a day once bleeding decreases.   Explained provider's recommendation to the patient. Reviewed amount of vaginal bleeding with patient. Patient states she is saturating half a pad before changing. Explained to pt that if she begins saturating a full pad an hour and this continues for 3 hours she needs to go immediately to the ED. Patient verbalizes understanding. Pt requests refill of 800 mg ibuprofen, verbal order obtained from Constant, MD.

## 2019-11-21 NOTE — Telephone Encounter (Signed)
Called pt; VM left stating I am returning her phone call regarding prescription and requesting the pt call the office for further assistance.

## 2019-12-04 ENCOUNTER — Ambulatory Visit: Payer: Self-pay | Admitting: Cardiovascular Disease

## 2019-12-04 ENCOUNTER — Telehealth: Payer: Self-pay | Admitting: Licensed Clinical Social Worker

## 2019-12-04 NOTE — Telephone Encounter (Signed)
Call placed to patient. LCSW left message requesting a return call.  

## 2019-12-05 ENCOUNTER — Ambulatory Visit: Payer: Medicaid Other | Admitting: Obstetrics and Gynecology

## 2019-12-10 ENCOUNTER — Other Ambulatory Visit: Payer: Self-pay

## 2019-12-10 ENCOUNTER — Ambulatory Visit: Payer: Self-pay | Attending: Internal Medicine | Admitting: Licensed Clinical Social Worker

## 2019-12-10 DIAGNOSIS — F4323 Adjustment disorder with mixed anxiety and depressed mood: Secondary | ICD-10-CM

## 2019-12-10 MED FILL — ELIQUIS 5 MG TABLET: 5 | 30 days supply | Qty: 60 | Fill #1

## 2019-12-10 MED FILL — CARAFATE 1 GM/10 ML SUSP: 1 | 10 days supply | Qty: 200 | Fill #0

## 2019-12-10 MED FILL — ?OMEPRAZOLE 20MG CAP DR: 20 | 30 days supply | Qty: 60 | Fill #1

## 2019-12-10 MED FILL — hydrOXYzine HCL 50 MG TABS: 50 | 30 days supply | Qty: 90 | Fill #2

## 2019-12-10 MED FILL — TRUE METRIX TEST STRIP: 30 days supply | Qty: 100 | Fill #0

## 2019-12-10 MED FILL — GABAPENTIN 400 MG CAPSULE: 400 | 30 days supply | Qty: 90 | Fill #1

## 2019-12-10 MED FILL — BUPROPION SR 150 MG TABLET: 150 | 30 days supply | Qty: 60 | Fill #1

## 2019-12-10 MED FILL — FOLIC ACID 1 MG TABS: 1 | 30 days supply | Qty: 30 | Fill #1

## 2019-12-10 MED FILL — TRUE METRIX BLOOD GLUCOSE M: W/DEVICE | 30 days supply | Qty: 1 | Fill #0

## 2019-12-10 MED FILL — FERROUS SULFATE 325 MG TAB: 325 (65 FE) | 30 days supply | Qty: 30 | Fill #1

## 2019-12-10 MED FILL — TRUEplus LANCETS 28G MISC: 30 days supply | Qty: 100 | Fill #0

## 2019-12-10 MED FILL — AMLODIPINE BESYLATE 5 MG TA: 5 | 30 days supply | Qty: 30 | Fill #1

## 2019-12-10 MED FILL — SERTRALINE HCL 50 MG TABLET: 50 | 30 days supply | Qty: 30 | Fill #1

## 2019-12-10 NOTE — BH Specialist Note (Signed)
Terri Sparks (Telephone)  12/10/2019 Terri Sparks [id number]   Session Start time: 3:35 PM  Session End time: 4:15 PM Total time: 24   Referring Provider: Dr. Wynetta Emery Type of Visit: Telephonic Patient location: [facility] Provider location: Office All persons participating in visit: LCSW and Patient  Confirmed patient's address: Yes  Confirmed patient's phone number: Yes  Any changes to demographics: No   Confirmed patient's insurance: Yes  Any changes to patient's insurance: No   Discussed confidentiality: Yes    The following statements were read to the patient and/or legal guardian that are established with the [facility] Provider.  "The purpose of this phone visit is to provide behavioral health care while limiting exposure to the coronavirus (COVID19).  There is a possibility of technology failure and discussed alternative modes of communication if that failure occurs."  "By engaging in this telephone visit, you consent to the provision of healthcare.  Additionally, you authorize for your insurance to be billed for the services provided during this telephone visit."   Patient and/or legal guardian consented to telephone visit: Yes   PRESENTING CONCERNS: Patient and/or family reports the following symptoms/concerns: Pt reports difficulty managing anxiety and depression. Symptoms including ticks, panic attacks, feelings of overwhelm, racing thoughts. Triggers include waiting on pending disability decision and financial strain. Pt is residing with friend; however, feels bad about about not being independent.  Duration of problem: Ongoing; Severity of problem: severe  STRENGTHS (Protective Factors/Coping Skills): Pt enjoys spending time with grandchildren Pt participates in medication management  GOALS ADDRESSED: Patient will: 1.  Reduce symptoms of: anxiety, depression and stress  2.  Increase knowledge and/or ability of:  coping skills and healthy habits  3.  Demonstrate ability to: Increase healthy adjustment to current life circumstances and Increase adequate support systems for patient/family  INTERVENTIONS: Interventions utilized:  Solution-Focused Strategies, Supportive Counseling and Psychoeducation and/or Health Education Standardized Assessments completed: Not Needed  ASSESSMENT: Patient currently experiencing symptoms of anxiety and depression triggered by psychosocial stressors. Pt denied SI/HI and was successful in identifying protective factors.   Patient may benefit from continued medication management and linkage to community resources. LCSW discussed correlation between one's mental and physical health. Grounding strategies were discussed to assist in the management and/or decrease of symptoms. Pt is not interested in therapy at this time; however, requested refill of medications.   PLAN: 1. Follow up with behavioral health clinician on : Contact LCSW regarding behavioral health and/or resource needs 2. Behavioral recommendations: Continue to comply with medication management and utilize strategies discussed  3. Referral(s): [facility])  Rebekah Chesterfield, [place] 12/14/2019 8:39 AM

## 2019-12-11 ENCOUNTER — Telehealth (INDEPENDENT_AMBULATORY_CARE_PROVIDER_SITE_OTHER): Payer: Self-pay | Admitting: Licensed Clinical Social Worker

## 2019-12-11 ENCOUNTER — Encounter: Payer: Self-pay | Admitting: Internal Medicine

## 2019-12-11 NOTE — Telephone Encounter (Signed)
Call placed to patient to inform her that medications have been refilled. Pt was appreciative. No additional concerns noted.

## 2019-12-13 MED FILL — NICOTINE 14 MG/24HR PATCH: 14 | 28 days supply | Qty: 28 | Fill #0

## 2019-12-13 MED FILL — !LANTUS 100 UNITS/ML VIAL: 100 | 26 days supply | Qty: 20 | Fill #1

## 2019-12-13 MED FILL — !NOVOLOG 100UNITS/ML VIAL: 100/ML | 22 days supply | Qty: 10 | Fill #1

## 2019-12-13 MED FILL — !VENTOLIN HFA INHALER: 108 (90 BAS | 25 days supply | Qty: 18 | Fill #0

## 2019-12-17 ENCOUNTER — Ambulatory Visit: Payer: Medicaid Other | Admitting: Obstetrics and Gynecology

## 2019-12-18 MED FILL — hydrOXYzine HCL 50 MG TABS: 50 | 30 days supply | Qty: 90 | Fill #2

## 2019-12-18 MED FILL — GABAPENTIN 400 MG CAPSULE: 400 | 30 days supply | Qty: 90 | Fill #1

## 2019-12-18 MED FILL — TRUE METRIX BLOOD GLUCOSE M: W/DEVICE | 30 days supply | Qty: 1 | Fill #0

## 2019-12-18 MED FILL — BUPROPION SR 150 MG TABLET: 150 | 30 days supply | Qty: 60 | Fill #1

## 2019-12-18 MED FILL — FERROUS SULFATE 325 MG TAB: 325 (65 FE) | 30 days supply | Qty: 30 | Fill #1

## 2019-12-18 MED FILL — CARAFATE 1 GM/10 ML SUSP: 1 | 10 days supply | Qty: 200 | Fill #0

## 2019-12-18 MED FILL — ?OMEPRAZOLE 20MG CAP DR: 20 | 30 days supply | Qty: 60 | Fill #1

## 2019-12-18 MED FILL — TRUE METRIX TEST STRIP: 30 days supply | Qty: 100 | Fill #0

## 2019-12-18 MED FILL — !ELIQUIS 5 MG TABLET: 5 | 30 days supply | Qty: 60 | Fill #1

## 2019-12-18 MED FILL — SERTRALINE HCL 50 MG TABLET: 50 | 30 days supply | Qty: 30 | Fill #1

## 2019-12-18 MED FILL — AMLODIPINE BESYLATE 5 MG TA: 5 | 30 days supply | Qty: 30 | Fill #1

## 2019-12-18 MED FILL — FOLIC ACID 1 MG TABS: 1 | 30 days supply | Qty: 30 | Fill #1

## 2019-12-18 MED FILL — TRUEplus LANCETS 28G MISC: 30 days supply | Qty: 100 | Fill #0

## 2019-12-26 MED FILL — hydrOXYzine HCL 25 MG TABS: 25 | 30 days supply | Qty: 180 | Fill #1

## 2019-12-27 ENCOUNTER — Other Ambulatory Visit: Payer: Self-pay

## 2019-12-27 ENCOUNTER — Ambulatory Visit (INDEPENDENT_AMBULATORY_CARE_PROVIDER_SITE_OTHER): Payer: Medicaid Other | Admitting: Obstetrics and Gynecology

## 2019-12-27 ENCOUNTER — Encounter: Payer: Self-pay | Admitting: Obstetrics and Gynecology

## 2019-12-27 ENCOUNTER — Telehealth: Payer: Self-pay | Admitting: Pharmacist

## 2019-12-27 VITALS — BP 136/71 | HR 91 | Wt 262.7 lb

## 2019-12-27 DIAGNOSIS — N939 Abnormal uterine and vaginal bleeding, unspecified: Secondary | ICD-10-CM

## 2019-12-27 MED ORDER — MEGESTROL ACETATE 40 MG PO TABS: 40.0000 mg | ORAL_TABLET | Freq: Two times a day (BID) | ORAL | 5 refills | Status: DC

## 2019-12-27 MED FILL — MEGESTROL 40 MG TABLET: 40 | 15 days supply | Qty: 60 | Fill #0

## 2019-12-27 NOTE — Telephone Encounter (Signed)
Patient called reporting that she is waiting for clearance from her PCP for hysterectomy surgery. She saw Dr. Rip Harbour today and I explained that his office will need to communicate with ours regarding this. I told her that I would route this message to her PCP.

## 2019-12-27 NOTE — Progress Notes (Signed)
Ms Pospisil presents to discuss surgery and schedule. See prior office notes. Stopped Megace, states it did not work. Has seen PCP but I have not received pre op clearance. She is off coumadin now and on Eliquis.   PE AF VSS Lungs clear Heart RRR Abd soft + BS obese GU nl EGBUS uterus small mobile slightly limited by pt habitus  A/P DUB        Pt to contact PCP about pre op clearance. Will schedule TVH/BS once obtained. F/U with post op

## 2019-12-27 NOTE — Patient Instructions (Signed)
Vaginal Hysterectomy, Care After Refer to this sheet in the next few weeks. These instructions provide you with information about caring for yourself after your procedure. Your health care provider may also give you more specific instructions. Your treatment has been planned according to current medical practices, but problems sometimes occur. Call your health care provider if you have any problems or questions after your procedure. What can I expect after the procedure? After the procedure, it is common to have:  Pain.  Soreness and numbness in your incision areas.  Vaginal bleeding and discharge.  Constipation.  Temporary problems emptying the bladder.  Feelings of sadness or other emotions. Follow these instructions at home: Medicines  Take over-the-counter and prescription medicines only as told by your health care provider.  If you were prescribed an antibiotic medicine, take it as told by your health care provider. Do not stop taking the antibiotic even if you start to feel better.  Do not drive or operate heavy machinery while taking prescription pain medicine. Activity  Return to your normal activities as told by your health care provider. Ask your health care provider what activities are safe for you.  Get regular exercise as told by your health care provider. You may be told to take short walks every day and go farther each time.  Do not lift anything that is heavier than 10 lb (4.5 kg). General instructions   Do not put anything in your vagina for 6 weeks after your surgery or as told by your health care provider. This includes tampons and douches.  Do not have sex until your health care provider says you can.  Do not take baths, swim, or use a hot tub until your health care provider approves.  Drink enough fluid to keep your urine clear or pale yellow.  Do not drive for 24 hours if you were given a sedative.  Keep all follow-up visits as told by your health  care provider. This is important. Contact a health care provider if:  Your pain medicine is not helping.  You have a fever.  You have redness, swelling, or pain at your incision site.  You have blood, pus, or a bad-smelling discharge from your vagina.  You continue to have difficulty urinating. Get help right away if:  You have severe abdominal or back pain.  You have heavy bleeding from your vagina.  You have chest pain or shortness of breath. This information is not intended to replace advice given to you by your health care provider. Make sure you discuss any questions you have with your health care provider. Document Revised: 05/13/2016 Document Reviewed: 10/05/2015 Elsevier Patient Education  2020 [place]. Vaginal Hysterectomy  A vaginal hysterectomy is a procedure to remove all or part of the uterus through a small incision in the vagina. In this procedure, your health care provider may remove your entire uterus, including the lower end (cervix). You may need a vaginal hysterectomy to treat:  Uterine fibroids.  A condition that causes the lining of the uterus to grow in other areas (endometriosis).  Problems with pelvic support.  Cancer of the cervix, ovaries, uterus, or tissue that lines the uterus (endometrium).  Excessive (dysfunctional) uterine bleeding. When removing your uterus, your health care provider may also remove the organs that produce eggs (ovaries) and the tubes that carry eggs to your uterus (fallopian tubes). After a vaginal hysterectomy, you will no longer be able to have a baby. You will also no longer get your  menstrual period. Tell a health care provider about:  Any allergies you have.  All medicines you are taking, including vitamins, herbs, eye drops, creams, and over-the-counter medicines.  Any problems you or family members have had with anesthetic medicines.  Any blood disorders you have.  Any surgeries you have had.  Any medical  conditions you have.  Whether you are pregnant or may be pregnant. What are the risks? Generally, this is a safe procedure. However, problems may occur, including:  Bleeding.  Infection.  A blood clot that forms in your leg and travels to your lungs (pulmonary embolism).  Damage to surrounding organs.  Pain during sex. What happens before the procedure?  Ask your health care provider what organs will be removed during surgery.  Ask your health care provider about: ? Changing or stopping your regular medicines. This is especially important if you are taking diabetes medicines or blood thinners. ? Taking medicines such as aspirin and ibuprofen. These medicines can thin your blood. Do not take these medicines before your procedure if your health care provider instructs you not to.  Follow instructions from your health care provider about eating or drinking restrictions.  Do not use any tobacco products, such as cigarettes, chewing tobacco, and e-cigarettes. If you need help quitting, ask your health care provider.  Plan to have someone take you home after discharge from the hospital. What happens during the procedure?  To reduce your risk of infection: ? Your health care team will wash or sanitize their hands. ? Your skin will be washed with soap.  An IV tube will be inserted into one of your veins.  You may be given antibiotic medicine to help prevent infection.  You will be given one or more of the following: ? A medicine to help you relax (sedative). ? A medicine to numb the area (local anesthetic). ? A medicine to make you fall asleep (general anesthetic). ? A medicine that is injected into an area of your body to numb everything beyond the injection site (regional anesthetic).  Your surgeon will make an incision in your vagina.  Your surgeon will locate and remove all or part of your uterus.  Your ovaries and fallopian tubes may be removed at the same time.  The  incision will be closed with stitches (sutures) that dissolve over time. The procedure may vary among health care providers and hospitals. What happens after the procedure?  Your blood pressure, heart rate, breathing rate, and blood oxygen level will be monitored often until the medicines you were given have worn off.  You will be encouraged to get up and walk around after a few hours to help prevent complications.  You may have IV tubes in place for a few days.  You will be given pain medicine as needed.  Do not drive for 24 hours if you were given a sedative. This information is not intended to replace advice given to you by your health care provider. Make sure you discuss any questions you have with your health care provider. Document Revised: 05/13/2016 Document Reviewed: 10/05/2015 Elsevier Patient Education  2020 [employer].

## 2019-12-28 NOTE — Telephone Encounter (Signed)
Could you please contact pt and schedule pre-op clearance. If pt is needing it sooner please schedule her with an available provider

## 2019-12-28 NOTE — Telephone Encounter (Signed)
Patient was called, reached VM. LVM informing patient to call office to schedule pre-op appointment. Please follow up at your earliest convenience.

## 2019-12-31 ENCOUNTER — Ambulatory Visit: Payer: Medicaid Other

## 2019-12-31 ENCOUNTER — Telehealth: Payer: Self-pay | Admitting: Obstetrics and Gynecology

## 2019-12-31 NOTE — Telephone Encounter (Signed)
Received a call from Ms. Leinberger. She stated she would come in and sign papers when she comes to [facility] for her pre-op.

## 2020-01-10 ENCOUNTER — Encounter: Payer: Self-pay | Admitting: *Deleted

## 2020-01-21 ENCOUNTER — Encounter: Payer: Self-pay | Admitting: Internal Medicine

## 2020-01-21 ENCOUNTER — Other Ambulatory Visit: Payer: Self-pay

## 2020-01-21 ENCOUNTER — Ambulatory Visit: Payer: Medicaid Other | Attending: Internal Medicine | Admitting: Internal Medicine

## 2020-01-21 VITALS — BP 130/84 | HR 96 | Temp 97.0°F | Resp 16 | Wt 253.8 lb

## 2020-01-21 DIAGNOSIS — E1129 Type 2 diabetes mellitus with other diabetic kidney complication: Secondary | ICD-10-CM

## 2020-01-21 DIAGNOSIS — I739 Peripheral vascular disease, unspecified: Secondary | ICD-10-CM

## 2020-01-21 DIAGNOSIS — E1151 Type 2 diabetes mellitus with diabetic peripheral angiopathy without gangrene: Secondary | ICD-10-CM | POA: Insufficient documentation

## 2020-01-21 DIAGNOSIS — F17200 Nicotine dependence, unspecified, uncomplicated: Secondary | ICD-10-CM

## 2020-01-21 DIAGNOSIS — F17210 Nicotine dependence, cigarettes, uncomplicated: Secondary | ICD-10-CM | POA: Insufficient documentation

## 2020-01-21 DIAGNOSIS — N1832 Chronic kidney disease, stage 3b: Secondary | ICD-10-CM | POA: Insufficient documentation

## 2020-01-21 DIAGNOSIS — Z885 Allergy status to narcotic agent status: Secondary | ICD-10-CM | POA: Insufficient documentation

## 2020-01-21 DIAGNOSIS — E1122 Type 2 diabetes mellitus with diabetic chronic kidney disease: Secondary | ICD-10-CM | POA: Diagnosis not present

## 2020-01-21 DIAGNOSIS — Z881 Allergy status to other antibiotic agents status: Secondary | ICD-10-CM | POA: Insufficient documentation

## 2020-01-21 DIAGNOSIS — E1121 Type 2 diabetes mellitus with diabetic nephropathy: Secondary | ICD-10-CM | POA: Diagnosis not present

## 2020-01-21 DIAGNOSIS — Z833 Family history of diabetes mellitus: Secondary | ICD-10-CM | POA: Insufficient documentation

## 2020-01-21 DIAGNOSIS — E1140 Type 2 diabetes mellitus with diabetic neuropathy, unspecified: Secondary | ICD-10-CM | POA: Insufficient documentation

## 2020-01-21 DIAGNOSIS — Z79899 Other long term (current) drug therapy: Secondary | ICD-10-CM | POA: Insufficient documentation

## 2020-01-21 DIAGNOSIS — I129 Hypertensive chronic kidney disease with stage 1 through stage 4 chronic kidney disease, or unspecified chronic kidney disease: Secondary | ICD-10-CM | POA: Insufficient documentation

## 2020-01-21 DIAGNOSIS — E1165 Type 2 diabetes mellitus with hyperglycemia: Secondary | ICD-10-CM

## 2020-01-21 DIAGNOSIS — Z888 Allergy status to other drugs, medicaments and biological substances status: Secondary | ICD-10-CM | POA: Diagnosis not present

## 2020-01-21 DIAGNOSIS — I10 Essential (primary) hypertension: Secondary | ICD-10-CM

## 2020-01-21 DIAGNOSIS — F1910 Other psychoactive substance abuse, uncomplicated: Secondary | ICD-10-CM | POA: Insufficient documentation

## 2020-01-21 DIAGNOSIS — R809 Proteinuria, unspecified: Secondary | ICD-10-CM | POA: Insufficient documentation

## 2020-01-21 DIAGNOSIS — N938 Other specified abnormal uterine and vaginal bleeding: Secondary | ICD-10-CM | POA: Diagnosis not present

## 2020-01-21 DIAGNOSIS — Z01818 Encounter for other preprocedural examination: Secondary | ICD-10-CM | POA: Diagnosis present

## 2020-01-21 DIAGNOSIS — G4700 Insomnia, unspecified: Secondary | ICD-10-CM | POA: Diagnosis not present

## 2020-01-21 DIAGNOSIS — Z8249 Family history of ischemic heart disease and other diseases of the circulatory system: Secondary | ICD-10-CM | POA: Insufficient documentation

## 2020-01-21 DIAGNOSIS — Z7901 Long term (current) use of anticoagulants: Secondary | ICD-10-CM | POA: Diagnosis not present

## 2020-01-21 DIAGNOSIS — Z794 Long term (current) use of insulin: Secondary | ICD-10-CM | POA: Diagnosis not present

## 2020-01-21 DIAGNOSIS — Z7982 Long term (current) use of aspirin: Secondary | ICD-10-CM | POA: Diagnosis not present

## 2020-01-21 DIAGNOSIS — K219 Gastro-esophageal reflux disease without esophagitis: Secondary | ICD-10-CM | POA: Insufficient documentation

## 2020-01-21 DIAGNOSIS — Z880 Allergy status to penicillin: Secondary | ICD-10-CM | POA: Diagnosis not present

## 2020-01-21 DIAGNOSIS — Z882 Allergy status to sulfonamides status: Secondary | ICD-10-CM | POA: Insufficient documentation

## 2020-01-21 LAB — GLUCOSE, POCT (MANUAL RESULT ENTRY): POC Glucose: 216 mg/dl — AB (ref 70–99)

## 2020-01-21 MED ORDER — INSULIN GLARGINE 100 UNIT/ML ~~LOC~~ SOLN: 78.0000 [IU] | Freq: Every day | SUBCUTANEOUS | 2 refills | Status: DC

## 2020-01-21 MED FILL — !LANTUS 100 UNITS/ML VIAL: 100 | 13 days supply | Qty: 10 | Fill #2

## 2020-01-21 MED FILL — FOLIC ACID 1 MG TABS: 1 | 30 days supply | Qty: 30 | Fill #2

## 2020-01-21 MED FILL — TRUE METRIX TEST STRIP: 30 days supply | Qty: 100 | Fill #1

## 2020-01-21 MED FILL — SERTRALINE HCL 50 MG TABLET: 50 | 30 days supply | Qty: 30 | Fill #2

## 2020-01-21 MED FILL — FERROUS SULFATE 325 MG TAB: 325 (65 FE) | 30 days supply | Qty: 30 | Fill #2

## 2020-01-21 MED FILL — CARAFATE 1 GM/10 ML SUSP: 1 | 10 days supply | Qty: 200 | Fill #1

## 2020-01-21 MED FILL — BUPROPION SR 150 MG TABLET: 150 | 30 days supply | Qty: 60 | Fill #2

## 2020-01-21 MED FILL — NovoLOG 100 UNIT/ML SOLN: 100 | 22 days supply | Qty: 10 | Fill #2

## 2020-01-21 MED FILL — AMLODIPINE BESYLATE 5 MG TA: 5 | 30 days supply | Qty: 30 | Fill #2

## 2020-01-21 MED FILL — MEGESTROL 40 MG TABLET: 40 | 15 days supply | Qty: 60 | Fill #0

## 2020-01-21 MED FILL — GABAPENTIN 400 MG CAPSULE: 400 | 30 days supply | Qty: 90 | Fill #2

## 2020-01-21 MED FILL — ELIQUIS 5 MG TABLET: 5 | 30 days supply | Qty: 60 | Fill #2

## 2020-01-21 MED FILL — hydrOXYzine HCL 50 MG TABS: 50 | 30 days supply | Qty: 90 | Fill #3

## 2020-01-21 NOTE — Patient Instructions (Signed)
Increase Lantus insulin to 78 units daily.  If after 3 days you are still waking up with morning blood sugars greater than 130, increase the Lantus insulin to 82 units.  Please refrain from using street drugs.  You should not use any street drugs within 2 weeks of your surgery.  Try to cut back on smoking.

## 2020-01-21 NOTE — Progress Notes (Signed)
Patient ID: Terri Sparks, female    DOB: 1975-11-09  MRN: [id number]  CC: surgical clearance   Subjective: Terri Sparks is a 44 y.o. female who presents for pre-op evaluation Her concerns today include:  Hx ofDM type2 with macroalbumin, CKD 3,HTN,tob dep,PVDwith hx LLEacute limb ischemia requiring stent to the iliac artery and embolectomy followed by Dr. Zenia Resides vascular surgery, hyperparathyroidism s/p resection of parathryroid adenoma 2010, GERD,IDA,B12 def/folate def anxiety/dep.  Pt need clearance for hysterectomy.  She has been having issues with dysfunctional uterine bleeding for over a year. No problems waking from anesthesia in the past. Can walk up a flight of stairs without having to stop Echo 09/2019 stable slightly reduce EF of 45-50% that is stable from prior study done in 2019.  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  DM Results for orders placed or performed in visit on 01/21/20  POCT glucose (manual entry)  Result Value Ref Range   POC Glucose 216 (A) 70 - 99 mg/dl   Lab Results  Component Value Date   HGBA1C 8.6 (H) 09/23/2019   Eating habits: Less bread, rice potatoes.  Still has days days when she can not eat due to nausea.  She has figured out that certain foods like greasy foods and drinking too much coffee upsets her stomach.  Not having to take Prilosec any more. On Keto diet x 3 wks.  Down 10 lbs since 10/2019 -checking BS 6-7 x a day..  Does not have log.  Reports BS staying in low 200s.   Using Lantus 72 units daily and 5-15 units of Novolog with meals  HTN:  Takes Norvasc in afternoons and did not take as yet today. No device to check BP. No CP, palpitations, LE edema, SOB  Tob dep:  Cut down.  Down to 1 pk/wk Was smoking marijuana every day.  Last used cocaine 5 days ago.  Reports that she is trying to quit both   Patient Active Problem List     Diagnosis Date Noted  . Homeless 11/13/2019  . Chest pain at rest 09/24/2019  . Gastritis 09/24/2019  . Sinus tachycardia 09/24/2019  . Sepsis secondary to UTI ([place]) 09/23/2019  . Polysubstance abuse ([place]) 09/23/2019  . Atypical chest pain   . Stage 3b chronic kidney disease 07/30/2019  . Iron deficiency anemia due to chronic blood loss 06/12/2019  . Vitamin B12 deficiency 06/12/2019  . Folate deficiency 06/12/2019  . Constipation 06/12/2019  . Prolonged QT interval   . Ureteral stone with hydronephrosis 06/11/2019  . Acute renal failure superimposed on stage 3 chronic kidney disease ([place]) 06/26/2018  . Right ureteral stone 06/26/2018  . Essential hypertension 06/26/2018  . Subtherapeutic international normalized ratio (INR) 06/26/2018  . Adrenal nodule ([place]) 06/21/2018  . Lung nodules 06/21/2018  . Abnormal uterine bleeding (AUB) 05/14/2018  . Hemorrhagic cyst of right ovary 05/14/2018  . Ischemia of extremity 05/12/2018  . Former smoker 03/14/2018  . 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 Thromboembolism ([place]) 02/25/2018  . Microalbuminuria 09/29/2017  . Insomnia 09/29/2017  . Immunization due 08/01/2017  . Anxiety and depression 04/28/2017  . 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 Iliac artery occlusion, left ([place]) 02/25/2017  . Tobacco abuse 02/25/2017  . Insulin-requiring or dependent type II diabetes mellitus ([place]) 02/25/2017  . Peripheral vascular disease of lower extremity ([place])  02/24/2017  . GERD (gastroesophageal reflux disease)   . Hyperparathyroidism      Current Outpatient Medications on File Prior to Visit  Medication Sig Dispense Refill  . acetaminophen (TYLENOL) 500 MG tablet Take 2,000 mg by mouth daily as needed (for headaches).     Marland Kitchen albuterol (PROAIR HFA) 108 (90 Base) MCG/ACT inhaler Inhale 2 puffs into the lungs every 6 (six) hours as needed for wheezing or shortness of breath. 18 g 2  . amLODipine (NORVASC) 5 MG tablet Take  1 tablet (5 mg total) by mouth daily. 30 tablet 11  . apixaban (ELIQUIS) 5 MG TABS tablet Take 1 tablet (5 mg total) by mouth 2 (two) times daily. 60 tablet 2  . aspirin EC 81 MG tablet Take 1 tablet (81 mg total) by mouth daily. 30 tablet 11  . blood glucose meter kit and supplies Dispense based on patient and insurance preference. Use up to four times daily as directed. (FOR ICD-9 250.00, 250.01). 1 each 0  . Blood Glucose Monitoring Suppl (TRUE METRIX METER) w/Device KIT Use as directed 1 kit 0  . buPROPion (WELLBUTRIN SR) 150 MG 12 hr tablet Take 1 tablet (150 mg total) by mouth 2 (two) times daily. 60 tablet 11  . ferrous sulfate 325 (65 FE) MG tablet Take 1 tablet (325 mg total) by mouth daily with breakfast. 30 tablet 3  . folic acid (FOLVITE) 1 MG tablet Take 1 tablet (1 mg total) by mouth daily. 30 tablet 3  . gabapentin (NEURONTIN) 400 MG capsule Take 1 capsule (400 mg total) by mouth 3 (three) times daily. 90 capsule 3  . glucose blood (TRUE METRIX BLOOD GLUCOSE TEST) test strip Use as instructed 100 each 12  . hydrOXYzine (ATARAX/VISTARIL) 50 MG tablet Take 1 tablet (50 mg total) by mouth 3 (three) times daily as needed for itching or anxiety. 90 tablet 4  . ibuprofen (ADVIL) 800 MG tablet Take 1 tablet (800 mg total) by mouth every 8 (eight) hours as needed. 60 tablet 1  . insulin aspart (NOVOLOG) 100 UNIT/ML injection Inject 5-15 Units into the skin 3 (three) times daily before meals. 10 mL 3  . Insulin Pen Needle 31G X 5 MM MISC Use with Lantus and Novolog injections. 100 each 11  . Insulin Syringe-Needle U-100 (BD INSULIN SYRINGE ULTRAFINE) 31G X 5/16" 0.5 ML MISC Use as directed 100 each 3  . Insulin Syringes, Disposable, U-100 1 ML MISC Use as directed 100 each 0  . lip balm (CARMEX) ointment Apply topically as needed for lip care. (Patient not taking: Reported on 09/23/2019) 7 g 0  . loratadine (CLARITIN) 10 MG tablet Take 1 tablet (10 mg total) by mouth daily as needed for  allergies. 30 tablet 2  . megestrol (MEGACE) 40 MG tablet Take 0.5 tablets (20 mg total) by mouth 2 (two) times daily. Can increase to two tablets twice a day in the event of heavy bleeding (Patient not taking: Reported on 12/27/2019) 60 tablet 5  . megestrol (MEGACE) 40 MG tablet Take 1 tablet (40 mg total) by mouth 2 (two) times daily. Can increase to two tablets twice a day in the event of heavy bleeding 60 tablet 5  . nitroGLYCERIN (NITROSTAT) 0.4 MG SL tablet Place 1 tablet (0.4 mg total) under the tongue every 5 (five) minutes as needed for chest pain. 25 tablet 3  . omeprazole (PRILOSEC) 20 MG capsule Take 1 capsule (20 mg total) by mouth 2 (two) times daily before a  meal. 60 capsule 3  . senna-docusate (SENOKOT-S) 8.6-50 MG tablet Take 1 tablet by mouth 2 (two) times daily. (Patient not taking: Reported on 12/27/2019) 60 tablet 2  . sertraline (ZOLOFT) 50 MG tablet Take 1 tablet (50 mg total) by mouth daily. 1/2 tab daily x 2 wks then 1 tab PO daily 30 tablet 4  . sucralfate (CARAFATE) 1 GM/10ML suspension Take 10 mLs (1 g total) by mouth 2 (two) times daily with breakfast and lunch. 200 mL 1  . TRUEplus Lancets 28G MISC Use as directed 100 each 4  . vitamin B-12 (CYANOCOBALAMIN) 1000 MCG tablet Take 2 tablets (2,000 mcg total) by mouth daily. 30 tablet 3   No current facility-administered medications on file prior to visit.    Allergies  Allergen Reactions  . Ciprofloxacin Hcl Hives  . Dilaudid [Hydromorphone Hcl] Shortness Of Breath and Other (See Comments)    "Asystole," per pt report  . Macrobid [Nitrofurantoin Macrocrystal] Hives, Shortness Of Breath and Rash  . Other Hives, Shortness Of Breath and Rash    NO "-CILLINS"!!!  . Penicillins Shortness Of Breath    Had had cephalosporins without incident Has patient had a PCN reaction causing immediate rash, facial/tongue/throat swelling, SOB or lightheadedness with hypotension: Yes Has patient had a PCN reaction causing severe rash  involving mucus membranes or skin necrosis: Unk Has patient had a PCN reaction that required hospitalization: Unk Has patient had a PCN reaction occurring within the last 10 years: No If all of the above answers are "NO", then may proceed with Cephalosporin use.   . Sulfa Antibiotics Hives, Shortness Of Breath and Rash  . Lexapro [Escitalopram Oxalate] Other (See Comments)    "I just did not like it."    Social History   Socioeconomic History  . Marital status: Single    Spouse name: Not on file  . Number of children: Not on file  . Years of education: Not on file  . Highest education level: Not on file  Occupational History  . Not on file  Tobacco Use  . Smoking status: Current Some Day Smoker    Packs/day: 0.30    Years: 15.00    Pack years: 4.50    Types: Cigarettes  . Smokeless tobacco: Never Used  Substance and Sexual Activity  . Alcohol use: Yes    Comment: occassionally  . Drug use: Yes    Types: Marijuana, Cocaine    Comment: once daily- last time used was night of 02/21/2019  . Sexual activity: Yes    Partners: Male    Birth control/protection: Other-see comments    Comment: BTL  Other Topics Concern  . Not on file  Social History Narrative  . Not on file   Social Determinants of Health   Financial Resource Strain:   . Difficulty of Paying Living Expenses:   Food Insecurity:   . Worried About Charity fundraiser in the Last Year:   . Arboriculturist in the Last Year:   Transportation Needs:   . Film/video editor (Medical):   Marland Kitchen Lack of Transportation (Non-Medical):   Physical Activity:   . Days of Exercise per Week:   . Minutes of Exercise per Session:   Stress:   . Feeling of Stress :   Social Connections:   . Frequency of Communication with Friends and Family:   . Frequency of Social Gatherings with Friends and Family:   . Attends Religious Services:   . Active Member of  Clubs or Organizations:   . Attends Archivist Meetings:     Marland Kitchen Marital Status:   Intimate Partner Violence:   . Fear of Current or Ex-Partner:   . Emotionally Abused:   Marland Kitchen Physically Abused:   . Sexually Abused:     Family History  Problem Relation Age of Onset  . Depression Mother   . Diabetes Father   . Heart failure Father     Past Surgical History:  Procedure Laterality Date  . ABDOMINAL AORTOGRAM W/LOWER EXTREMITY N/A 11/14/2017   Procedure: ABDOMINAL AORTOGRAM W/LOWER EXTREMITY;  Surgeon: Waynetta Sandy, MD;  Location: [facility];  Service: Cardiovascular;  Laterality: N/A;  . ANGIOPLASTY ILLIAC ARTERY Left 02/25/2018   Procedure: BALLOON ANGIOPLASTY LEFT ILIAC ARTERY;  Surgeon: Conrad [place], MD;  Location: [facility];  Service: Vascular;  Laterality: Left;  . APPLICATION OF WOUND VAC Left 03/03/2018   Procedure: APPLICATION OF WOUND VAC;  Surgeon: Angelia Mould, MD;  Location: [facility];  Service: Vascular;  Laterality: Left;  . CYSTOSCOPY W/ URETERAL STENT PLACEMENT Right 06/27/2018   Procedure: CYSTOSCOPY WITH RETROGRADE PYELOGRAM/URETERAL DOUBLE J STENT PLACEMENT;  Surgeon: Ceasar Mons, MD;  Location: [facility];  Service: Urology;  Laterality: Right;  . CYSTOSCOPY WITH STENT PLACEMENT N/A 02/23/2019   Procedure: CYSTOSCOPY WITH RIGHT URETERAL STENT EXCHANGE/ LEFT URETERAL STENT PLACEMENT;  Surgeon: Ceasar Mons, MD;  Location: [facility];  Service: Urology;  Laterality: N/A;  . CYSTOSCOPY WITH STENT PLACEMENT Right 06/11/2019   Procedure: CYSTOSCOPY,RIGHT RETROGRADE WITH STENT PLACEMENT;  Surgeon: Irine Seal, MD;  Location: [facility];  Service: Urology;  Laterality: Right;  . CYSTOSCOPY/RETROGRADE/URETEROSCOPY/STONE EXTRACTION WITH BASKET Bilateral 05/01/2019   Procedure: CYSTOSCOPY/URETEROSCOPY/STONE EXTRACTION / LASER LITHOTRIPSY, BILATERAL URETEROSCOPY WITH URETERAL STONE EXTRACTION AND STENT EXCHANGE;  Surgeon: Ceasar Mons, MD;  Location: [facility];  Service: Urology;   Laterality: Bilateral;  . CYSTOSCOPY/URETEROSCOPY/HOLMIUM LASER/STENT PLACEMENT Right 06/27/2019   Procedure: CYSTOSCOPY/URETEROSCOPY/HOLMIUM LASER/STENT EXCHANGE;  Surgeon: Ceasar Mons, MD;  Location: [facility];  Service: Urology;  Laterality: Right;  . EMBOLECTOMY Left 02/24/2017   Procedure: Left Lower Extremity Embolectomy and Angiogram.;  Surgeon: Waynetta Sandy, MD;  Location: [facility];  Service: Vascular;  Laterality: Left;  . INSERTION OF ILIAC STENT  02/24/2017   Procedure: INSERTION OF Common ILIAC STENT;  Surgeon: Waynetta Sandy, MD;  Location: [facility] [place];  Service: Vascular;;  . INTRAOPERATIVE ARTERIOGRAM Left 02/25/2018   Procedure: INTRA OPERATIVE ARTERIOGRAM WITH LEFT LEG RUNOFF;  Surgeon: Conrad [place], MD;  Location: [facility];  Service: Vascular;  Laterality: Left;  . LOWER EXTREMITY ANGIOGRAM Left 02/24/2017   Procedure: Aortagram, Left lower extremity Run-off;  Surgeon: Waynetta Sandy, MD;  Location: [facility];  Service: Vascular;  Laterality: Left;  . PATCH ANGIOPLASTY Left 02/25/2018   Procedure: PATCH ANGIOPLASTY LEFT SUPERFICIAL FEMORAL ARTERY WITH BOVINE PATCH;  Surgeon: Conrad [place], MD;  Location: [facility];  Service: Vascular;  Laterality: Left;  . PERCUTANEOUS VENOUS THROMBECTOMY,LYSIS WITH INTRAVASCULAR ULTRASOUND (IVUS) Left 05/12/2018   Procedure: MECHANICAL THROMBECTOMY LEFT LEG, BALLOON ANGIOPLASTY LEFT ANTERIOR TIBIAL ARTERY, AORTOGRAM WITH LEFT LEG RUNOFF;  Surgeon: Serafina Mitchell, MD;  Location: [facility];  Service: Vascular;  Laterality: Left;  . 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 THROMBECTOMY FEMORAL ARTERY Left 02/25/2018   Procedure: LEFT ILIAC AND POPLITEAL ARTERY THROMBECTOMY;  Surgeon: Conrad [place], MD;  Location: [facility] [place];  Service: Vascular;  Laterality: Left;  . TUBAL  LIGATION    . WOUND DEBRIDEMENT Left 03/03/2018   Procedure: DEBRIDEMENT WOUND;  Surgeon: Angelia Mould, MD;  Location: [facility];  Service: Vascular;  Laterality:  Left;  . WOUND EXPLORATION Left 03/03/2018   Procedure: WOUND EXPLORATION;  Surgeon: Angelia Mould, MD;  Location: [facility];  Service: Vascular;  Laterality: Left;    ROS: Review of Systems Negative except as stated above  PHYSICAL EXAM: BP 130/84   Pulse 96   Temp (!) 97 F (36.1 C)   Resp 16   Wt 253 lb 12.8 oz (115.1 kg)   SpO2 97%   BMI 39.75 kg/m   Wt Readings from Last 3 Encounters:  01/21/20 253 lb 12.8 oz (115.1 kg)  12/27/19 262 lb 11.2 oz (119.2 kg)  10/17/19 263 lb 11.2 oz (119.6 kg)    Physical Exam  General appearance - alert, well appearing, and in no distress Mental status - normal mood, behavior, speech, dress, motor activity, and thought processes Eyes - pupils equal and reactive, extraocular eye movements intact Mouth - mucous membranes moist, pharynx normal without lesions Neck - supple, no significant adenopathy Chest - clear to auscultation, no wheezes, rales or rhonchi, symmetric air entry Heart - normal rate, regular rhythm, normal S1, S2, no murmurs, rubs, clicks or gallops Abdomen - soft, nontender, nondistended, no masses or organomegaly Extremities - peripheral pulses normal, no pedal edema, no clubbing or cyanosis   CMP Latest Ref Rng & Units 09/24/2019 09/23/2019 09/22/2019  Glucose 70 - 99 mg/dL 130(H) 192(H) 256(H)  BUN 6 - 20 mg/dL 10 12 14   Creatinine 0.44 - 1.00 mg/dL 1.68(H) 1.63(H) 1.84(H)  Sodium 135 - 145 mmol/L 135 137 133(L)  Potassium 3.5 - 5.1 mmol/L 3.5 3.5 3.7  Chloride 98 - 111 mmol/L 105 104 94(L)  CO2 22 - 32 mmol/L 23 23 25   Calcium 8.9 - 10.3 mg/dL 9.1 9.0 10.2  Total Protein 6.5 - 8.1 g/dL - 7.0 8.2(H)  Total Bilirubin 0.3 - 1.2 mg/dL - 0.5 0.6  Alkaline Phos 38 - 126 U/L - 78 93  AST 15 - 41 U/L - 11(L) 14(L)  ALT 0 - 44 U/L - 12 15   Lipid Panel     Component Value Date/Time   CHOL 84 06/27/2018 0126   CHOL 151 01/27/2018 1157   TRIG 233 (H) 06/27/2018 0126   HDL 13 (L) 06/27/2018 0126   HDL 36 (L)  01/27/2018 1157   CHOLHDL 6.5 06/27/2018 0126   VLDL 47 (H) 06/27/2018 0126   LDLCALC 24 06/27/2018 0126   LDLCALC 82 01/27/2018 1157    CBC    Component Value Date/Time   WBC 11.2 (H) 09/24/2019 0831   RBC 4.25 09/24/2019 0831   HGB 12.8 09/24/2019 0831   HGB 12.3 07/25/2018 1711   HCT 40.4 09/24/2019 0831   HCT 37.8 07/25/2018 1711   PLT 275 09/24/2019 0831   PLT 490 (H) 01/27/2018 1157   MCV 95.1 09/24/2019 0831   MCV 87 07/25/2018 1711   MCH 30.1 09/24/2019 0831   MCHC 31.7 09/24/2019 0831   RDW 13.8 09/24/2019 0831   RDW 16.4 (H) 07/25/2018 1711   LYMPHSABS 2.7 09/24/2019 0831   LYMPHSABS 3.4 (H) 07/25/2018 1711   MONOABS 0.7 09/24/2019 0831   EOSABS 0.4 09/24/2019 0831   EOSABS 0.6 (H) 07/25/2018 1711   BASOSABS 0.1 09/24/2019 0831   BASOSABS 0.1 07/25/2018 1711   EKG: Normal EKG.  Normal sinus rhythm. ASSESSMENT AND PLAN: 1. Preoperative  evaluation to rule out surgical contraindication 2. Dysfunctional uterine bleeding -Plan is for hysterectomy.  Patient at moderate risk for perioperative major adverse cardiac event.  However she has a functional capacity of at least 4 METS as she reports she is able to get up a full flight of stairs without difficulty.  Ejection fraction mildly reduced but stable.  I will try to get her in with cardiology prior to her surgery but if she is unable to see the cardiologist due to lack of insurance I think it is reasonable for [name] to go ahead and approve her for the surgery.  Encouraged her to discontinue smoking several weeks prior to surgery.  Advised of abstaining from street drugs always but at least several weeks prior to surgery. - EKG 12-Lead  3. Type 2 diabetes mellitus with microalbuminuria, with long-term current use of insulin (HCC) Check A1c today.  Increase Lantus insulin to 78 units.  Encouraged her to continue healthy eating habits.  Commended her on weight loss so far. - POCT glucose (manual entry) - Hemoglobin A1c - CBC  With Differential - Comprehensive metabolic panel - Lipid panel - insulin glargine (LANTUS) 100 UNIT/ML injection; Inject 0.78 mLs (78 Units total) into the skin at bedtime.  Dispense: 10 mL; Refill: 2  4. Essential hypertension Close to goal.  Continue amlodipine.  5. Tobacco dependence Encourage her to quit smoking.  Commended her on how she is cut back  6. PVD (peripheral vascular disease) ([place]) Plan will be to hold Eliquis at least 3 days prior to surgery without bridging.  7. Substance abuse ([place]) Strongly advised to quit.  Advised patient that if she test positive on the morning of her surgery the gynecologist would likely cancel her surgery.  She expressed understanding.     Patient was given the opportunity to ask questions.  Patient verbalized understanding of the plan and was able to repeat key elements of the plan.   Orders Placed This Encounter  Procedures  . Hemoglobin A1c  . CBC With Differential  . Comprehensive metabolic panel  . Lipid panel  . POCT glucose (manual entry)  . EKG 12-Lead     Requested Prescriptions   Signed Prescriptions Disp Refills  . insulin glargine (LANTUS) 100 UNIT/ML injection 10 mL 2    Sig: Inject 0.78 mLs (78 Units total) into the skin at bedtime.    Return in about 2 months (around 03/22/2020).  Karle Plumber, MD, FACP

## 2020-01-22 LAB — HEMOGLOBIN A1C
Est. average glucose Bld gHb Est-mCnc: 194 mg/dL
Hgb A1c MFr Bld: 8.4 % — ABNORMAL HIGH (ref 4.8–5.6)

## 2020-01-22 LAB — COMPREHENSIVE METABOLIC PANEL
ALT: 9 IU/L (ref 0–32)
AST: 12 IU/L (ref 0–40)
Albumin/Globulin Ratio: 1.5 (ref 1.2–2.2)
Albumin: 4.4 g/dL (ref 3.8–4.8)
Alkaline Phosphatase: 108 IU/L (ref 39–117)
BUN/Creatinine Ratio: 9 (ref 9–23)
BUN: 15 mg/dL (ref 6–24)
Bilirubin Total: <0.2 mg/dL (ref 0.0–1.2)
CO2: 22 mmol/L (ref 20–29)
Calcium: 10.1 mg/dL (ref 8.7–10.2)
Chloride: 103 mmol/L (ref 96–106)
Creatinine, Ser: 1.6 mg/dL — ABNORMAL HIGH (ref 0.57–1.00)
GFR calc Af Amer: 45 mL/min/{1.73_m2} — ABNORMAL LOW (ref >59)
GFR calc non Af Amer: 39 mL/min/{1.73_m2} — ABNORMAL LOW (ref >59)
Globulin, Total: 3 g/dL (ref 1.5–4.5)
Glucose: 220 mg/dL — ABNORMAL HIGH (ref 65–99)
Potassium: 4.5 mmol/L (ref 3.5–5.2)
Sodium: 138 mmol/L (ref 134–144)
Total Protein: 7.4 g/dL (ref 6.0–8.5)

## 2020-01-22 LAB — CBC WITH DIFFERENTIAL
Basophils Absolute: 0.1 10*3/uL (ref 0.0–0.2)
Basos: 1 %
EOS (ABSOLUTE): 0.4 10*3/uL (ref 0.0–0.4)
Eos: 4 %
Hematocrit: 44.1 % (ref 34.0–46.6)
Hemoglobin: 14.6 g/dL (ref 11.1–15.9)
Immature Grans (Abs): 0 10*3/uL (ref 0.0–0.1)
Immature Granulocytes: 0 %
Lymphocytes Absolute: 3.1 10*3/uL (ref 0.7–3.1)
Lymphs: 29 %
MCH: 29.6 pg (ref 26.6–33.0)
MCHC: 33.1 g/dL (ref 31.5–35.7)
MCV: 89 fL (ref 79–97)
Monocytes Absolute: 0.6 10*3/uL (ref 0.1–0.9)
Monocytes: 6 %
Neutrophils Absolute: 6.5 10*3/uL (ref 1.4–7.0)
Neutrophils: 60 %
RBC: 4.94 x10E6/uL (ref 3.77–5.28)
RDW: 13.5 % (ref 11.7–15.4)
WBC: 10.7 10*3/uL (ref 3.4–10.8)

## 2020-01-22 LAB — LIPID PANEL
Chol/HDL Ratio: 4.4 ratio (ref 0.0–4.4)
Cholesterol, Total: 164 mg/dL (ref 100–199)
HDL: 37 mg/dL — ABNORMAL LOW (ref >39)
LDL Chol Calc (NIH): 87 mg/dL (ref 0–99)
Triglycerides: 235 mg/dL — ABNORMAL HIGH (ref 0–149)
VLDL Cholesterol Cal: 40 mg/dL (ref 5–40)

## 2020-01-23 ENCOUNTER — Telehealth: Payer: Self-pay

## 2020-01-23 ENCOUNTER — Other Ambulatory Visit: Payer: Self-pay | Admitting: Internal Medicine

## 2020-01-23 MED ORDER — ATORVASTATIN CALCIUM 10 MG PO TABS: 10.0000 mg | ORAL_TABLET | Freq: Every day | ORAL | 3 refills | Status: DC

## 2020-01-23 MED FILL — ?ATORVASTATIN CALCIUM 10MG: 10 | 30 days supply | Qty: 30 | Fill #0

## 2020-01-23 NOTE — Telephone Encounter (Signed)
Contacted pt to go over lab results pt didn't answer lvm asking pt to give a call back at her earliest convenience  

## 2020-01-28 ENCOUNTER — Telehealth: Payer: Self-pay | Admitting: Internal Medicine

## 2020-01-28 NOTE — Progress Notes (Deleted)
Cardiology Office Note:    Date:  01/28/2020   ID:  Terri Sparks, DOB Oct 11, 1975, MRN [id number]  PCP:  Terri Pier, MD  Cardiologist:  No primary care provider on file.  Electrophysiologist:  None   Referring MD: Terri Pier, MD   Chief Complaint: ***  History of Present Illness:    Terri Sparks is a 44 y.o. female with a history of PAD with history of lower left extremity acute ischemia s/p embolectomy and stenting to the iliac artery in ***, hypertension, type 2 diabetes mellitus, hyperparathyroidism a/p resection of parathyroid adenoma in 2010, GERD, iron deficiency anemia, CKD stage III, and substance abuse who presents today for pre-op evaluation.   ****  Past Medical History:  Diagnosis Date  . Anxiety   . Depression   . Diabetes ([place])    Type 2  . GERD (gastroesophageal reflux disease)   . History of blood clots    ,DVT-left leg, and early 2000, had blood clot behind left breast  . History of kidney stones   . Hypercalcemia   . Hyperparathyroidism   . Hypertension    had been on Lisinopril and PCP took her off it 4 months ago  . Iron deficiency anemia   . Left thyroid nodule   . Mass of right ovary   . Nephrolithiasis   . PAOD (peripheral arterial occlusive disease) ([place])   . Peripheral vascular disease ([place])   . Prolonged Q-T interval on ECG 04/19/2019  . Renal calculi   . Substance abuse ([place])   . Tooth loose    top tooth from the right  is loose     Past Surgical History:  Procedure Laterality Date  . ABDOMINAL AORTOGRAM W/LOWER EXTREMITY N/A 11/14/2017   Procedure: ABDOMINAL AORTOGRAM W/LOWER EXTREMITY;  Surgeon: Waynetta Sandy, MD;  Location: [facility];  Service: Cardiovascular;  Laterality: N/A;  . ANGIOPLASTY ILLIAC ARTERY Left 02/25/2018   Procedure: BALLOON ANGIOPLASTY LEFT ILIAC ARTERY;  Surgeon: Conrad LaFayette, MD;  Location: [facility];  Service: Vascular;  Laterality: Left;  . APPLICATION OF WOUND VAC Left  03/03/2018   Procedure: APPLICATION OF WOUND VAC;  Surgeon: Angelia Mould, MD;  Location: [place];  Service: Vascular;  Laterality: Left;  . CYSTOSCOPY W/ URETERAL STENT PLACEMENT Right 06/27/2018   Procedure: CYSTOSCOPY WITH RETROGRADE PYELOGRAM/URETERAL DOUBLE J STENT PLACEMENT;  Surgeon: Ceasar Mons, MD;  Location: [place];  Service: Urology;  Laterality: Right;  . CYSTOSCOPY WITH STENT PLACEMENT N/A 02/23/2019   Procedure: CYSTOSCOPY WITH RIGHT URETERAL STENT EXCHANGE/ LEFT URETERAL STENT PLACEMENT;  Surgeon: Ceasar Mons, MD;  Location: [facility];  Service: Urology;  Laterality: N/A;  . CYSTOSCOPY WITH STENT PLACEMENT Right 06/11/2019   Procedure: CYSTOSCOPY,RIGHT RETROGRADE WITH STENT PLACEMENT;  Surgeon: Irine Seal, MD;  Location: [facility];  Service: Urology;  Laterality: Right;  . CYSTOSCOPY/RETROGRADE/URETEROSCOPY/STONE EXTRACTION WITH BASKET Bilateral 05/01/2019   Procedure: CYSTOSCOPY/URETEROSCOPY/STONE EXTRACTION / LASER LITHOTRIPSY, BILATERAL URETEROSCOPY WITH URETERAL STONE EXTRACTION AND STENT EXCHANGE;  Surgeon: Ceasar Mons, MD;  Location: [facility];  Service: Urology;  Laterality: Bilateral;  . CYSTOSCOPY/URETEROSCOPY/HOLMIUM LASER/STENT PLACEMENT Right 06/27/2019   Procedure: CYSTOSCOPY/URETEROSCOPY/HOLMIUM LASER/STENT EXCHANGE;  Surgeon: Ceasar Mons, MD;  Location: [facility];  Service: Urology;  Laterality: Right;  . EMBOLECTOMY Left 02/24/2017   Procedure: Left Lower Extremity Embolectomy and Angiogram.;  Surgeon: Waynetta Sandy, MD;  Location: [place];  Service: Vascular;  Laterality: Left;  . INSERTION OF ILIAC  STENT  02/24/2017   Procedure: INSERTION OF Common ILIAC STENT;  Surgeon: Waynetta Sandy, MD;  Location: [place];  Service: Vascular;;  . INTRAOPERATIVE ARTERIOGRAM Left 02/25/2018   Procedure: INTRA OPERATIVE ARTERIOGRAM WITH LEFT LEG RUNOFF;  Surgeon: Conrad Weed, MD;  Location: [place];   Service: Vascular;  Laterality: Left;  . LOWER EXTREMITY ANGIOGRAM Left 02/24/2017   Procedure: Aortagram, Left lower extremity Run-off;  Surgeon: Waynetta Sandy, MD;  Location: [place];  Service: Vascular;  Laterality: Left;  . PATCH ANGIOPLASTY Left 02/25/2018   Procedure: PATCH ANGIOPLASTY LEFT SUPERFICIAL FEMORAL ARTERY WITH BOVINE PATCH;  Surgeon: Conrad [place], MD;  Location: [place];  Service: Vascular;  Laterality: Left;  . PERCUTANEOUS VENOUS THROMBECTOMY,LYSIS WITH INTRAVASCULAR ULTRASOUND (IVUS) Left 05/12/2018   Procedure: MECHANICAL THROMBECTOMY LEFT LEG, BALLOON ANGIOPLASTY LEFT ANTERIOR TIBIAL ARTERY, AORTOGRAM WITH LEFT LEG RUNOFF;  Surgeon: Serafina Mitchell, MD;  Location: [place];  Service: Vascular;  Laterality: Left;  . 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 THROMBECTOMY FEMORAL ARTERY Left 02/25/2018   Procedure: LEFT ILIAC AND POPLITEAL ARTERY THROMBECTOMY;  Surgeon: Conrad Lambert, MD;  Location: [place];  Service: Vascular;  Laterality: Left;  . TUBAL LIGATION    . WOUND DEBRIDEMENT Left 03/03/2018   Procedure: DEBRIDEMENT WOUND;  Surgeon: Angelia Mould, MD;  Location: [place];  Service: Vascular;  Laterality: Left;  . WOUND EXPLORATION Left 03/03/2018   Procedure: WOUND EXPLORATION;  Surgeon: Angelia Mould, MD;  Location: [place];  Service: Vascular;  Laterality: Left;    Current Medications: No outpatient medications have been marked as taking for the 02/01/20 encounter (Appointment) with Darreld Mclean, PA-C.     Allergies:   Ciprofloxacin hcl, Dilaudid [hydromorphone hcl], Macrobid [nitrofurantoin macrocrystal], Other, Penicillins, Sulfa antibiotics, and Lexapro [escitalopram oxalate]   Social History   Socioeconomic History  . Marital status: Single    Spouse name: Not on file  . Number of children: Not on file  . Years of education: Not on file  . Highest education level: Not on file  Occupational History  . Not on file  Tobacco Use  .  Smoking status: Current Some Day Smoker    Packs/day: 0.30    Years: 15.00    Pack years: 4.50    Types: Cigarettes  . Smokeless tobacco: Never Used  Substance and Sexual Activity  . Alcohol use: Yes    Comment: occassionally  . Drug use: Yes    Types: Marijuana, Cocaine    Comment: once daily- last time used was night of 02/21/2019  . Sexual activity: Yes    Partners: Male    Birth control/protection: Other-see comments    Comment: BTL  Other Topics Concern  . Not on file  Social History Narrative  . Not on file   Social Determinants of Health   Financial Resource Strain:   . Difficulty of Paying Living Expenses:   Food Insecurity:   . Worried About Charity fundraiser in the Last Year:   . Arboriculturist in the Last Year:   Transportation Needs:   . Film/video editor (Medical):   Marland Kitchen Lack of Transportation (Non-Medical):   Physical Activity:   . Days of Exercise per Week:   . Minutes of Exercise per Session:   Stress:   . Feeling of Stress :   Social Connections:   . Frequency of Communication with Friends and Family:   . Frequency of Social Gatherings  with Friends and Family:   . Attends Religious Services:   . Active Member of Clubs or Organizations:   . Attends Archivist Meetings:   Marland Kitchen Marital Status:      Family History: The patient's ***family history includes Depression in her mother; Diabetes in her father; Heart failure in her father.  ROS:   Please see the history of present illness.    *** All other systems reviewed and are negative.  EKGs/Labs/Other Studies Reviewed:    The following studies were reviewed today:  Echocardiogram 09/23/2019: Impressions: 1. Left ventricular ejection fraction, by visual estimation, is 45 to  50%. The left ventricle has normal function. There is no left ventricular  hypertrophy.  2. Left ventricular diastolic parameters are indeterminate.  3. The left ventricle demonstrates global hypokinesis.    4. Global right ventricle has normal systolic function.The right  ventricular size is normal. No increase in right ventricular wall  thickness.  5. Left atrial size was normal.  6. Right atrial size was normal.  7. The mitral valve is normal in structure. Trivial mitral valve  regurgitation. No evidence of mitral stenosis.  8. The tricuspid valve is normal in structure. Tricuspid valve  regurgitation is trivial.  9. The aortic valve is normal in structure. Aortic valve regurgitation is  not visualized. No evidence of aortic valve sclerosis or stenosis.  10. The pulmonic valve was normal in structure. Pulmonic valve  regurgitation is trivial.  11. TR signal is inadequate for assessing pulmonary artery systolic  pressure.  12. The inferior vena cava is normal in size with greater than 50%  respiratory variability, suggesting right atrial pressure of 3 mmHg.  EKG:  EKG ordered today. EKG personally reviewed and demonstrates ***.  Recent Labs: 07/30/2019: TSH 0.983 09/23/2019: Magnesium 1.8 09/24/2019: Platelets 275 01/21/2020: ALT 9; BUN 15; Creatinine, Ser 1.60; Hemoglobin 14.6; Potassium 4.5; Sodium 138  Recent Lipid Panel    Component Value Date/Time   CHOL 164 01/21/2020 1603   TRIG 235 (H) 01/21/2020 1603   HDL 37 (L) 01/21/2020 1603   CHOLHDL 4.4 01/21/2020 1603   CHOLHDL 6.5 06/27/2018 0126   VLDL 47 (H) 06/27/2018 0126   LDLCALC 87 01/21/2020 1603    Physical Exam:    Vital Signs: There were no vitals taken for this visit.    Wt Readings from Last 3 Encounters:  01/21/20 253 lb 12.8 oz (115.1 kg)  12/27/19 262 lb 11.2 oz (119.2 kg)  10/17/19 263 lb 11.2 oz (119.6 kg)     General: 44 y.o. female in no acute distress. HEENT: Normocephalic and atraumatic. Sclera clear. EOMs intact. Neck: Supple. No carotid bruits. No JVD. Heart: *** RRR. Distinct S1 and S2. No murmurs, gallops, or rubs. Radial and distal pedal pulses 2+ and equal bilaterally. Lungs: No  increased work of breathing. Clear to ausculation bilaterally. No wheezes, rhonchi, or rales.  Abdomen: Soft, non-distended, and non-tender to palpation. Bowel sounds present in all 4 quadrants.  MSK: Normal strength and tone for age. *** Extremities: No lower extremity edema.    Skin: Warm and dry. Neuro: Alert and oriented x3. No focal deficits. Psych: Normal affect. Responds appropriately.   Assessment:    No diagnosis found.  Plan:     Disposition: Follow up in ***   Medication Adjustments/Labs and Tests Ordered: Current medicines are reviewed at length with the patient today.  Concerns regarding medicines are outlined above.  No orders of the defined types were placed in this encounter.  No orders of the defined types were placed in this encounter.   There are no Patient Instructions on file for this visit.   Signed, Darreld Mclean, PA-C  01/28/2020 3:12 PM    [facility]

## 2020-01-28 NOTE — Telephone Encounter (Signed)
-----   Message from Ena Dawley sent at 01/28/2020  8:50 AM EDT ----- Regarding: RE: please get her in with cardiology ASAP for pre-op cardiac eval Patient aware of her appointment  with [facility]  on 02/01/20 @ 8:15am  ----- Message ----- From: Ladell Pier, MD Sent: 01/23/2020   9:30 AM EDT To: Ena Dawley Subject: please get her in with cardiology ASAP for p#

## 2020-01-29 NOTE — Telephone Encounter (Signed)
Contacted Terri Sparks to go over Dr. Wynetta Emery message Terri Sparks didn't answer left a detailed vm infroming Terri Sparks of Dr. Wynetta Emery message and if she has any questions or concerns to give a call

## 2020-02-01 ENCOUNTER — Encounter: Payer: Self-pay | Admitting: Internal Medicine

## 2020-02-01 ENCOUNTER — Ambulatory Visit: Payer: Medicaid Other | Admitting: Student

## 2020-02-01 ENCOUNTER — Ambulatory Visit (INDEPENDENT_AMBULATORY_CARE_PROVIDER_SITE_OTHER): Payer: Medicaid Other | Admitting: Internal Medicine

## 2020-02-01 ENCOUNTER — Other Ambulatory Visit: Payer: Self-pay

## 2020-02-01 VITALS — Ht 67.0 in | Wt 264.0 lb

## 2020-02-01 DIAGNOSIS — N1832 Chronic kidney disease, stage 3b: Secondary | ICD-10-CM | POA: Diagnosis not present

## 2020-02-01 DIAGNOSIS — I745 Embolism and thrombosis of iliac artery: Secondary | ICD-10-CM

## 2020-02-01 DIAGNOSIS — R079 Chest pain, unspecified: Secondary | ICD-10-CM | POA: Diagnosis not present

## 2020-02-01 DIAGNOSIS — I739 Peripheral vascular disease, unspecified: Secondary | ICD-10-CM

## 2020-02-01 DIAGNOSIS — I10 Essential (primary) hypertension: Secondary | ICD-10-CM | POA: Diagnosis not present

## 2020-02-01 DIAGNOSIS — Z01810 Encounter for preprocedural cardiovascular examination: Secondary | ICD-10-CM | POA: Diagnosis not present

## 2020-02-01 DIAGNOSIS — I749 Embolism and thrombosis of unspecified artery: Secondary | ICD-10-CM

## 2020-02-01 MED ORDER — CARVEDILOL 3.125 MG PO TABS: 3.1250 mg | ORAL_TABLET | Freq: Two times a day (BID) | ORAL | 6 refills | Status: DC

## 2020-02-01 MED FILL — CARVEDILOL 3.125 MG TABLET: 3.125 | 30 days supply | Qty: 60 | Fill #0

## 2020-02-01 NOTE — Progress Notes (Signed)
Cardiology Office Note:    Date:  02/01/2020   ID:  Terri Sparks, DOB June 26, 1976, MRN [id number]  PCP:  Ladell Pier, MD  Cardiologist:  No primary care provider on file.  Electrophysiologist:  None   Referring MD: Ladell Pier, MD   Chief Complaint: preoperative assessment prior to hysterectomy for dysfunctional uterine bleeding   History of Present Illness:    Terri Sparks is a 44 y.o. 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 She presents for preoperative evaluation prior to hysterectomy.   Her father, paternal grandmother and paternal uncle all had "rare blood clotting disease." She has had recurrent LLE arterial thromboemboli in the setting of smoking with stent placement and subsequent stent occlusion. She was on coumadin and aspirin, subsequently switched to Eliquis due to recurrent clots while on coumadin despite adequate INR range.   Preoperative cardiovascular evaluation: Surgeon - Dr. Rip Harbour Pertinent past cardiac history: mildly reduced EF Prior cardiac workup: Echo EF 45-50% History of valve disease: normal valves History of CAD/PAD/CVA/TIA: LLE acute limb ischemia with stent and stent occlusion History of heart failure: LE edema, no sob History of arrhythmia: hx of palpitations, none currently On anticoagulation: eliquis 5 mg bid Additional history (hypertension, diabetes/on insulin, CKD/current creatinine, OSA, anesthesia complications) Current symptoms: Chest pain with emotional stress "when her kids drive her crazy" Functional capacity: > 4 METS  Past Medical History:  Diagnosis Date  . Anxiety   . Depression   . Diabetes ([place])    Type 2  . GERD (gastroesophageal reflux disease)   . History of blood clots    ,DVT-left leg, and early 2000, had blood clot behind left breast  . History of kidney stones   . Hypercalcemia   . Hyperparathyroidism   . Hypertension    had been on Lisinopril and PCP took her off it 4 months ago  . Iron deficiency anemia   . Left thyroid nodule   . Mass of right ovary   . Nephrolithiasis   . PAOD (peripheral arterial occlusive disease) ([place])   . Peripheral vascular disease ([place])   . Prolonged Q-T interval on ECG 04/19/2019  . Renal calculi   . Substance abuse ([place])   . Tooth loose    top tooth from the right  is loose     Past Surgical History:  Procedure Laterality Date  . ABDOMINAL AORTOGRAM W/LOWER EXTREMITY N/A 11/14/2017   Procedure: ABDOMINAL AORTOGRAM W/LOWER EXTREMITY;  Surgeon: Waynetta Sandy, MD;  Location: [facility];  Service: Cardiovascular;  Laterality: N/A;  . ANGIOPLASTY ILLIAC ARTERY Left 02/25/2018   Procedure: BALLOON ANGIOPLASTY LEFT ILIAC ARTERY;  Surgeon: Conrad Walterhill, MD;  Location: [place];  Service: Vascular;  Laterality: Left;  . APPLICATION OF WOUND VAC Left 03/03/2018   Procedure: APPLICATION OF WOUND VAC;  Surgeon: Angelia Mould, MD;  Location: [place];  Service: Vascular;  Laterality: Left;  . CYSTOSCOPY W/ URETERAL STENT PLACEMENT Right 06/27/2018   Procedure: CYSTOSCOPY WITH RETROGRADE PYELOGRAM/URETERAL DOUBLE J STENT PLACEMENT;  Surgeon: Ceasar Mons, MD;  Location: [place];  Service: Urology;  Laterality: Right;  . CYSTOSCOPY WITH STENT PLACEMENT N/A 02/23/2019   Procedure: CYSTOSCOPY WITH RIGHT URETERAL STENT EXCHANGE/ LEFT URETERAL STENT PLACEMENT;  Surgeon: Ceasar Mons, MD;  Location: [facility];  Service: Urology;  Laterality: N/A;  . CYSTOSCOPY WITH STENT PLACEMENT Right 06/11/2019   Procedure: CYSTOSCOPY,RIGHT RETROGRADE WITH STENT  PLACEMENT;  Surgeon: Irine Seal, MD;  Location: [facility];  Service: Urology;  Laterality: Right;  . CYSTOSCOPY/RETROGRADE/URETEROSCOPY/STONE EXTRACTION WITH BASKET Bilateral 05/01/2019   Procedure: CYSTOSCOPY/URETEROSCOPY/STONE EXTRACTION / LASER LITHOTRIPSY, BILATERAL URETEROSCOPY WITH URETERAL STONE EXTRACTION AND  STENT EXCHANGE;  Surgeon: Ceasar Mons, MD;  Location: [facility];  Service: Urology;  Laterality: Bilateral;  . CYSTOSCOPY/URETEROSCOPY/HOLMIUM LASER/STENT PLACEMENT Right 06/27/2019   Procedure: CYSTOSCOPY/URETEROSCOPY/HOLMIUM LASER/STENT EXCHANGE;  Surgeon: Ceasar Mons, MD;  Location: [facility];  Service: Urology;  Laterality: Right;  . EMBOLECTOMY Left 02/24/2017   Procedure: Left Lower Extremity Embolectomy and Angiogram.;  Surgeon: Waynetta Sandy, MD;  Location: [place];  Service: Vascular;  Laterality: Left;  . INSERTION OF ILIAC STENT  02/24/2017   Procedure: INSERTION OF Common ILIAC STENT;  Surgeon: Waynetta Sandy, MD;  Location: [place];  Service: Vascular;;  . INTRAOPERATIVE ARTERIOGRAM Left 02/25/2018   Procedure: INTRA OPERATIVE ARTERIOGRAM WITH LEFT LEG RUNOFF;  Surgeon: Conrad Clifton, MD;  Location: [place];  Service: Vascular;  Laterality: Left;  . LOWER EXTREMITY ANGIOGRAM Left 02/24/2017   Procedure: Aortagram, Left lower extremity Run-off;  Surgeon: Waynetta Sandy, MD;  Location: [place];  Service: Vascular;  Laterality: Left;  . PATCH ANGIOPLASTY Left 02/25/2018   Procedure: PATCH ANGIOPLASTY LEFT SUPERFICIAL FEMORAL ARTERY WITH BOVINE PATCH;  Surgeon: Conrad [place], MD;  Location: [place];  Service: Vascular;  Laterality: Left;  . PERCUTANEOUS VENOUS THROMBECTOMY,LYSIS WITH INTRAVASCULAR ULTRASOUND (IVUS) Left 05/12/2018   Procedure: MECHANICAL THROMBECTOMY LEFT LEG, BALLOON ANGIOPLASTY LEFT ANTERIOR TIBIAL ARTERY, AORTOGRAM WITH LEFT LEG RUNOFF;  Surgeon: Serafina Mitchell, MD;  Location: [place];  Service: Vascular;  Laterality: Left;  . 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 THROMBECTOMY FEMORAL ARTERY Left 02/25/2018   Procedure: LEFT ILIAC AND POPLITEAL ARTERY THROMBECTOMY;  Surgeon: Conrad , MD;  Location: [place];  Service: Vascular;  Laterality: Left;  . TUBAL LIGATION    . WOUND DEBRIDEMENT Left 03/03/2018    Procedure: DEBRIDEMENT WOUND;  Surgeon: Angelia Mould, MD;  Location: [place];  Service: Vascular;  Laterality: Left;  . WOUND EXPLORATION Left 03/03/2018   Procedure: WOUND EXPLORATION;  Surgeon: Angelia Mould, MD;  Location: [facility];  Service: Vascular;  Laterality: Left;    Current Medications: Current Meds  Medication Sig  . acetaminophen (TYLENOL) 500 MG tablet Take 2,000 mg by mouth daily as needed (for headaches).   Marland Kitchen albuterol (PROAIR HFA) 108 (90 Base) MCG/ACT inhaler Inhale 2 puffs into the lungs every 6 (six) hours as needed for wheezing or shortness of breath.  Marland Kitchen amLODipine (NORVASC) 5 MG tablet Take 1 tablet (5 mg total) by mouth daily.  Marland Kitchen apixaban (ELIQUIS) 5 MG TABS tablet Take 1 tablet (5 mg total) by mouth 2 (two) times daily.  Marland Kitchen aspirin EC 81 MG tablet Take 1 tablet (81 mg total) by mouth daily.  Marland Kitchen atorvastatin (LIPITOR) 10 MG tablet Take 1 tablet (10 mg total) by mouth daily.  . blood glucose meter kit and supplies Dispense based on patient and insurance preference. Use up to four times daily as directed. (FOR ICD-9 250.00, 250.01).  . Blood Glucose Monitoring Suppl (TRUE METRIX METER) w/Device KIT Use as directed  . buPROPion (WELLBUTRIN SR) 150 MG 12 hr tablet Take 1 tablet (150 mg total) by mouth 2 (two) times daily.  . ferrous sulfate 325 (65 FE) MG tablet Take 1 tablet (325 mg total) by mouth daily with breakfast.  .  folic acid (FOLVITE) 1 MG tablet Take 1 tablet (1 mg total) by mouth daily.  Marland Kitchen gabapentin (NEURONTIN) 400 MG capsule Take 1 capsule (400 mg total) by mouth 3 (three) times daily.  Marland Kitchen glucose blood (TRUE METRIX BLOOD GLUCOSE TEST) test strip Use as instructed  . hydrOXYzine (ATARAX/VISTARIL) 50 MG tablet Take 1 tablet (50 mg total) by mouth 3 (three) times daily as needed for itching or anxiety.  Marland Kitchen ibuprofen (ADVIL) 800 MG tablet Take 1 tablet (800 mg total) by mouth every 8 (eight) hours as needed.  . insulin aspart (NOVOLOG) 100 UNIT/ML  injection Inject 5-15 Units into the skin 3 (three) times daily before meals.  . insulin glargine (LANTUS) 100 UNIT/ML injection Inject 0.78 mLs (78 Units total) into the skin at bedtime.  . Insulin Pen Needle 31G X 5 MM MISC Use with Lantus and Novolog injections.  . Insulin Syringe-Needle U-100 (BD INSULIN SYRINGE ULTRAFINE) 31G X 5/16" 0.5 ML MISC Use as directed  . Insulin Syringes, Disposable, U-100 1 ML MISC Use as directed  . lip balm (CARMEX) ointment Apply topically as needed for lip care.  . loratadine (CLARITIN) 10 MG tablet Take 1 tablet (10 mg total) by mouth daily as needed for allergies.  . megestrol (MEGACE) 40 MG tablet Take 0.5 tablets (20 mg total) by mouth 2 (two) times daily. Can increase to two tablets twice a day in the event of heavy bleeding (Patient not taking: Reported on 03/03/2020)  . megestrol (MEGACE) 40 MG tablet Take 1 tablet (40 mg total) by mouth 2 (two) times daily. Can increase to two tablets twice a day in the event of heavy bleeding  . nitroGLYCERIN (NITROSTAT) 0.4 MG SL tablet Place 1 tablet (0.4 mg total) under the tongue every 5 (five) minutes as needed for chest pain.  Marland Kitchen senna-docusate (SENOKOT-S) 8.6-50 MG tablet Take 1 tablet by mouth 2 (two) times daily. (Patient not taking: Reported on 03/03/2020)  . sertraline (ZOLOFT) 50 MG tablet Take 1 tablet (50 mg total) by mouth daily. 1/2 tab daily x 2 wks then 1 tab PO daily (Patient taking differently: Take 50 mg by mouth daily. )  . sucralfate (CARAFATE) 1 GM/10ML suspension Take 10 mLs (1 g total) by mouth 2 (two) times daily with breakfast and lunch.  . TRUEplus Lancets 28G MISC Use as directed  . vitamin B-12 (CYANOCOBALAMIN) 1000 MCG tablet Take 2 tablets (2,000 mcg total) by mouth daily.     Allergies:   Ciprofloxacin hcl, Dilaudid [hydromorphone hcl], Macrobid [nitrofurantoin macrocrystal], Other, Penicillins, Sulfa antibiotics, and Lexapro [escitalopram oxalate]   Social History   Socioeconomic  History  . Marital status: Single    Spouse name: Not on file  . Number of children: Not on file  . Years of education: Not on file  . Highest education level: Not on file  Occupational History  . Not on file  Tobacco Use  . Smoking status: Current Some Day Smoker    Packs/day: 0.30    Years: 15.00    Pack years: 4.50    Types: Cigarettes  . Smokeless tobacco: Never Used  Substance and Sexual Activity  . Alcohol use: Yes    Comment: occassionally  . Drug use: Yes    Types: Marijuana, Cocaine    Comment: once daily- last time used was night of 02/21/2019  . Sexual activity: Yes    Partners: Male    Birth control/protection: Other-see comments    Comment: BTL  Other Topics Concern  .  Not on file  Social History Narrative  . Not on file   Social Determinants of Health   Financial Resource Strain:   . Difficulty of Paying Living Expenses:   Food Insecurity:   . Worried About Charity fundraiser in the Last Year:   . Arboriculturist in the Last Year:   Transportation Needs:   . Film/video editor (Medical):   Marland Kitchen Lack of Transportation (Non-Medical):   Physical Activity:   . Days of Exercise per Week:   . Minutes of Exercise per Session:   Stress:   . Feeling of Stress :   Social Connections:   . Frequency of Communication with Friends and Family:   . Frequency of Social Gatherings with Friends and Family:   . Attends Religious Services:   . Active Member of Clubs or Organizations:   . Attends Archivist Meetings:   Marland Kitchen Marital Status:      Family History: The patient's family history includes Depression in her mother; Diabetes in her father; Heart failure in her father.  ROS:   Please see the history of present illness.    All other systems reviewed and are negative.  EKGs/Labs/Other Studies Reviewed:    The following studies were reviewed today:  EKG:  Not performed today.  01/21/20 normal  I have independently reviewed the images from CTA CAP  06/26/2018 - no definite coronary artery calcifications.   Recent Labs: 07/30/2019: TSH 0.983 09/23/2019: Magnesium 1.8 02/19/2020: ALT 9 03/03/2020: BUN 21; Creatinine, Ser 2.42; Hemoglobin 15.3; Platelets 336; Potassium 4.2; Sodium 131  Recent Lipid Panel    Component Value Date/Time   CHOL 164 01/21/2020 1603   TRIG 235 (H) 01/21/2020 1603   HDL 37 (L) 01/21/2020 1603   CHOLHDL 4.4 01/21/2020 1603   CHOLHDL 6.5 06/27/2018 0126   VLDL 47 (H) 06/27/2018 0126   LDLCALC 87 01/21/2020 1603    Physical Exam:    VS:  Ht 5' 7"  (1.702 m)   Wt 264 lb (119.7 kg)   BMI 41.35 kg/m     Wt Readings from Last 5 Encounters:  03/03/20 252 lb (114.3 kg)  02/19/20 254 lb 4.8 oz (115.3 kg)  02/01/20 264 lb (119.7 kg)  01/21/20 253 lb 12.8 oz (115.1 kg)  12/27/19 262 lb 11.2 oz (119.2 kg)     Constitutional: No acute distress Eyes: sclera non-icteric, normal conjunctiva and lids ENMT: normal dentition, moist mucous membranes Cardiovascular: regular rhythm, normal rate, no murmurs. S1 and S2 normal. Radial pulses normal bilaterally. No jugular venous distention.  Respiratory: clear to auscultation bilaterally GI : normal bowel sounds, soft and nontender. No distention.   MSK: extremities warm, well perfused. No edema.  NEURO: grossly nonfocal exam, moves all extremities. PSYCH: alert and oriented x 3, normal mood and affect.   ASSESSMENT:    1. Preoperative cardiovascular examination   2. Peripheral vascular disease of lower extremity ([place])   3. Essential hypertension   4. Thrombosis of left iliac artery (H[facility])   5. Thromboembolism (H[facility])   6. Chest pain at rest   7. Stage 3b chronic kidney disease    PLAN:    Preoperative cardiovascular examination  Peripheral vascular disease of lower extremity (H[facility])  Essential hypertension  Thrombosis of left iliac artery (HCC)  Thromboembolism (H[facility])  Chest pain at rest  Stage 3b chronic kidney disease   She has chest pain at rest and  I would recommend ischemic evaluation given her mildly reduced ejection  fraction.  Unfortunately the patient is limited by lack of insurance, but is willing to proceed with stress testing if necessary.  I do feel this would be important for risk stratification given her risk factors of smoking, diabetes, hypertension, peripheral vascular disease in the setting of chest pain.  Would recommend a nuclear stress Myoview.  We will comment on surgical optimization after stress testing is complete. She is overall intermediate risk from a cardiovascular perspective prior to ischemic testing. I have discussed these recommendations with PMD Dr. Wynetta Emery.  For mildly reduced ejection fraction we should start carvedilol and uptitrate as tolerated.   Total time of encounter: 60 minutes total time of encounter, including 40 minutes spent in face-to-face patient care on the date of this encounter. This time includes coordination of care and counseling regarding above mentioned problem list. Remainder of non-face-to-face time involved reviewing chart documents/testing relevant to the patient encounter and documentation in the medical record. I have independently reviewed documentation from referring provider.   Cherlynn Kaiser, MD [place]  [facility]    Medication Adjustments/Labs and Tests Ordered: Current medicines are reviewed at length with the patient today.  Concerns regarding medicines are outlined above.  No orders of the defined types were placed in this encounter.  Meds ordered this encounter  Medications  . carvedilol (COREG) 3.125 MG tablet    Sig: Take 1 tablet (3.125 mg total) by mouth 2 (two) times daily.    Dispense:  60 tablet    Refill:  6    Patient Instructions  Medication Instructions:   Start taking Carvedilol  3.125 mg  One tablet twice a day    *If you need a refill on your cardiac medications before your next appointment, please call your pharmacy*   Lab Work: Not  needed   Testing/Procedures: Not  Needed at present  Once you ready ( have insurance)   to have testing pleas call office to have schedule a Lexiscan or Coronary CTA.     Follow-Up: At [facility], you and your health needs are our priority.  As part of our continuing mission to provide you with exceptional heart care, we have created designated Provider Care Teams.  These Care Teams include your primary Cardiologist (physician) and Advanced Practice Providers (APPs -  Physician Assistants and Nurse Practitioners) who all work together to provide you with the care you need, when you need it.  We recommend signing up for the patient portal called "MyChart".  Sign up information is provided on this After Visit Summary.  MyChart is used to connect with patients for Virtual Visits (Telemedicine).  Patients are able to view lab/test results, encounter notes, upcoming appointments, etc.  Non-urgent messages can be sent to your provider as well.   To learn more about what you can do with MyChart, go to [email or web address].    Your next appointment:   1 month(s)  The format for your next appointment:   In Person  Provider:   Cherlynn Kaiser, MD   Other Instructions

## 2020-02-01 NOTE — Patient Instructions (Addendum)
Medication Instructions:   Start taking Carvedilol  3.125 mg  One tablet twice a day    *If you need a refill on your cardiac medications before your next appointment, please call your pharmacy*   Lab Work: Not needed   Testing/Procedures: Not  Needed at present  Once you ready ( have insurance)   to have testing pleas call office to have schedule a Lexiscan or Coronary CTA.     Follow-Up: At [facility], you and your health needs are our priority.  As part of our continuing mission to provide you with exceptional heart care, we have created designated Provider Care Teams.  These Care Teams include your primary Cardiologist (physician) and Advanced Practice Providers (APPs -  Physician Assistants and Nurse Practitioners) who all work together to provide you with the care you need, when you need it.  We recommend signing up for the patient portal called "MyChart".  Sign up information is provided on this After Visit Summary.  MyChart is used to connect with patients for Virtual Visits (Telemedicine).  Patients are able to view lab/test results, encounter notes, upcoming appointments, etc.  Non-urgent messages can be sent to your provider as well.   To learn more about what you can do with MyChart, go to [email or web address].    Your next appointment:   1 month(s)  The format for your next appointment:   In Person  Provider:   Cherlynn Kaiser, MD   Other Instructions    you have clearance from a cardiology standpoint with a intermediate risk for upcoming surgery

## 2020-02-06 ENCOUNTER — Telehealth (HOSPITAL_COMMUNITY): Payer: Self-pay | Admitting: Internal Medicine

## 2020-02-06 ENCOUNTER — Telehealth: Payer: Self-pay | Admitting: *Deleted

## 2020-02-06 DIAGNOSIS — I749 Embolism and thrombosis of unspecified artery: Secondary | ICD-10-CM

## 2020-02-06 DIAGNOSIS — R0789 Other chest pain: Secondary | ICD-10-CM

## 2020-02-06 DIAGNOSIS — R079 Chest pain, unspecified: Secondary | ICD-10-CM

## 2020-02-06 DIAGNOSIS — N939 Abnormal uterine and vaginal bleeding, unspecified: Secondary | ICD-10-CM

## 2020-02-06 DIAGNOSIS — I779 Disorder of arteries and arterioles, unspecified: Secondary | ICD-10-CM

## 2020-02-06 DIAGNOSIS — I739 Peripheral vascular disease, unspecified: Secondary | ICD-10-CM

## 2020-02-06 NOTE — Telephone Encounter (Signed)
-----   Message from Elouise Munroe, MD sent at 02/06/2020 10:53 AM EDT ----- Sorry, she also needs a referral to hematology for history of history acute limb ischemia and family history of "rare blood clotting disorder" Thanks, GA

## 2020-02-06 NOTE — Telephone Encounter (Signed)
I called patient to schedule Myoview but patient she states she doesn't have insurance right now and no longer has the "Orange card" with [facility]. She has gotten approved for Disability per her lawyer this afternoon. She states she just can afford but if Dr. Lujean Rave wants her to have she will take on the cost. I advised her to call the office and to l speak with RN to verify what the Dr. wants to do at this time.

## 2020-02-06 NOTE — Telephone Encounter (Signed)
-----   Message from Elouise Munroe, MD sent at 02/06/2020 10:52 AM EDT ----- Carlyon Shadow, can you set her up for a stress myoview lexiscan at [address]? I'm concerned about her chest pain and history.  Thanks, GA

## 2020-02-06 NOTE — Telephone Encounter (Signed)
I will call the patient in the morning and discuss my recommendations further.

## 2020-02-06 NOTE — Telephone Encounter (Signed)
CALLED AND SPOKE TO PATIENT . PATIENT HAD SOME QUESTION AND STATES SHE WOULD HAVE CALL BACK . THIS WAS NOT A GOOD TIME.    Dr Margaretann Loveless  Would like the patient  to have lexiscan myoview to evaluate chest pain   After thinking over, Patient case since appointment. Dr Margaretann Loveless is  aware patient does not have medical  Insurance at present.  she also would like the patient to have referral to hematology .  both orders has been placed    Rn informed patient she could call back to discuss.Terri Sparks

## 2020-02-15 ENCOUNTER — Telehealth: Payer: Self-pay | Admitting: Hematology

## 2020-02-15 NOTE — Telephone Encounter (Signed)
Received a new hem referral from Dr. Margaretann Loveless for thromboembolism. Pt has been cld and scheduled to see Dr. Irene Limbo on 5/18 at 2:40pm. Pt aware to arrive 15 minutes early.

## 2020-02-19 ENCOUNTER — Other Ambulatory Visit: Payer: Self-pay

## 2020-02-19 ENCOUNTER — Inpatient Hospital Stay: Payer: Medicaid Other

## 2020-02-19 ENCOUNTER — Inpatient Hospital Stay: Payer: Medicaid Other | Attending: Hematology | Admitting: Hematology

## 2020-02-19 VITALS — BP 124/78 | HR 86 | Temp 97.8°F | Resp 18 | Ht 67.0 in | Wt 254.3 lb

## 2020-02-19 DIAGNOSIS — D6859 Other primary thrombophilia: Secondary | ICD-10-CM

## 2020-02-19 DIAGNOSIS — Z7901 Long term (current) use of anticoagulants: Secondary | ICD-10-CM | POA: Diagnosis not present

## 2020-02-19 DIAGNOSIS — I749 Embolism and thrombosis of unspecified artery: Secondary | ICD-10-CM

## 2020-02-19 DIAGNOSIS — F419 Anxiety disorder, unspecified: Secondary | ICD-10-CM | POA: Diagnosis not present

## 2020-02-19 DIAGNOSIS — I10 Essential (primary) hypertension: Secondary | ICD-10-CM | POA: Diagnosis not present

## 2020-02-19 DIAGNOSIS — Z832 Family history of diseases of the blood and blood-forming organs and certain disorders involving the immune mechanism: Secondary | ICD-10-CM | POA: Insufficient documentation

## 2020-02-19 DIAGNOSIS — I743 Embolism and thrombosis of arteries of the lower extremities: Secondary | ICD-10-CM

## 2020-02-19 DIAGNOSIS — F1490 Cocaine use, unspecified, uncomplicated: Secondary | ICD-10-CM | POA: Diagnosis not present

## 2020-02-19 DIAGNOSIS — E119 Type 2 diabetes mellitus without complications: Secondary | ICD-10-CM | POA: Diagnosis not present

## 2020-02-19 DIAGNOSIS — F17210 Nicotine dependence, cigarettes, uncomplicated: Secondary | ICD-10-CM

## 2020-02-19 DIAGNOSIS — Z794 Long term (current) use of insulin: Secondary | ICD-10-CM | POA: Insufficient documentation

## 2020-02-19 DIAGNOSIS — Z86718 Personal history of other venous thrombosis and embolism: Secondary | ICD-10-CM | POA: Diagnosis not present

## 2020-02-19 LAB — CBC WITH DIFFERENTIAL/PLATELET
Abs Immature Granulocytes: 0.06 10*3/uL (ref 0.00–0.07)
Basophils Absolute: 0.1 10*3/uL (ref 0.0–0.1)
Basophils Relative: 1 %
Eosinophils Absolute: 0.6 10*3/uL — ABNORMAL HIGH (ref 0.0–0.5)
Eosinophils Relative: 4 %
HCT: 47.6 % — ABNORMAL HIGH (ref 36.0–46.0)
Hemoglobin: 15.4 g/dL — ABNORMAL HIGH (ref 12.0–15.0)
Immature Granulocytes: 0 %
Lymphocytes Relative: 23 %
Lymphs Abs: 3.1 10*3/uL (ref 0.7–4.0)
MCH: 29.4 pg (ref 26.0–34.0)
MCHC: 32.4 g/dL (ref 30.0–36.0)
MCV: 91 fL (ref 80.0–100.0)
Monocytes Absolute: 0.6 10*3/uL (ref 0.1–1.0)
Monocytes Relative: 5 %
Neutro Abs: 9.1 10*3/uL — ABNORMAL HIGH (ref 1.7–7.7)
Neutrophils Relative %: 67 %
Platelets: 367 10*3/uL (ref 150–400)
RBC: 5.23 MIL/uL — ABNORMAL HIGH (ref 3.87–5.11)
RDW: 14.6 % (ref 11.5–15.5)
WBC: 13.6 10*3/uL — ABNORMAL HIGH (ref 4.0–10.5)
nRBC: 0 % (ref 0.0–0.2)

## 2020-02-19 LAB — CMP (CANCER CENTER ONLY)
ALT: 9 U/L (ref 0–44)
AST: 8 U/L — ABNORMAL LOW (ref 15–41)
Albumin: 3.7 g/dL (ref 3.5–5.0)
Alkaline Phosphatase: 91 U/L (ref 38–126)
Anion gap: 12 (ref 5–15)
BUN: 15 mg/dL (ref 6–20)
CO2: 19 mmol/L — ABNORMAL LOW (ref 22–32)
Calcium: 9.5 mg/dL (ref 8.9–10.3)
Chloride: 107 mmol/L (ref 98–111)
Creatinine: 1.88 mg/dL — ABNORMAL HIGH (ref 0.44–1.00)
GFR, Est AFR Am: 37 mL/min — ABNORMAL LOW (ref >60)
GFR, Estimated: 32 mL/min — ABNORMAL LOW (ref >60)
Glucose, Bld: 231 mg/dL — ABNORMAL HIGH (ref 70–99)
Potassium: 4 mmol/L (ref 3.5–5.1)
Sodium: 138 mmol/L (ref 135–145)
Total Bilirubin: 0.4 mg/dL (ref 0.3–1.2)
Total Protein: 7.7 g/dL (ref 6.5–8.1)

## 2020-02-19 LAB — ANTITHROMBIN III: AntiThromb III Func: 128 % — ABNORMAL HIGH (ref 75–120)

## 2020-02-19 NOTE — Progress Notes (Signed)
HEMATOLOGY/ONCOLOGY CONSULTATION NOTE  Date of Service: 02/19/2020  Patient Care Team: Ladell Pier, MD as PCP - General (Internal Medicine)  CHIEF COMPLAINTS/PURPOSE OF CONSULTATION:  Thromboembolism  HISTORY OF PRESENTING ILLNESS:   Terri Sparks is a wonderful 44 y.o. female who has been referred to [name] by Dr. Margaretann Loveless for evaluation and management of thromboembolism. Pt is accompanied today by her aunt. The pt reports that she is doing well overall.  The pt reports that in the early 2000's her left arm turned blue and she was found to have a blood clot behind her left breast. She was placed on Lovenox and Warfarin for two weeks following this incident. Pt had no further clotting issues until 2018. In May of 2018 and February of 2019 she was seen by vascular surgery for embolectomy after repeat arterial blood clots in the left lower extremity. After her first surgery she was placed on Warfarin which continued until December of 2020, when she was switched to Eliquis. Pt had clots while on Coumadin, despite "steady INR levels". She denies any current leg pain or cramping, but notes that her legs sometimes feel extremely heavy. She is currently taking 5 mg Eliquis twice per day.   Her cigarette smoking has been thought to be the cause of her numerous blood clots. Pt was smoking 2-3 ppd at her heaviest and is currently smoking 1/3 - 1/4 ppd. She smokes marijuana occasionally and states that she has not used cocaine since 2018. Recent labs do not support her claims of cessation of cocaine use.   Pt has not had any repeat clots while on Eliquis but notes that her menstrual cycles have been much heavier and more frequent. Pt has been on Megace for 2 months now and recently had her dosage increased from 20 mg to 40 mg.   Of note prior to the patient's visit today, pt has had [name] Lower Extremity Arterial Duplex ([id number]) completed on 08/25/2018 with results revealing "Right: Total  occlusion noted in the anterior tibial artery. Left: Total occlusion noted in the posterior tibial artery."   Most recent lab results (01/21/2020) of CBC w/diff and CMP is as follows: all values are WNL except for Glucose at 220, Creatinine at 1.60, GFR Est Non Af Am at 39.  On review of systems, pt denies leg cramping, abdominal pain, calf pain, thigh pain and any other symptoms.   On PMHx the pt reports Diabetes Type II, Anxiety, Hx of Blood Clots, HTN. On Social Hx the pt reports that she is currently smoking 1/3-1/4 ppd. She used to smoke as much as 2-3 ppd. Pt states that she last used cocaine in 2018.  On Family Hx the pt reports that her father, paternal grandmother, and paternal uncle have a history of blood clots. They are thought to have a Factor V Leiden mutation.    MEDICAL HISTORY:  Past Medical History:  Diagnosis Date  . Anxiety   . Depression   . Diabetes ([place])    Type 2  . GERD (gastroesophageal reflux disease)   . History of blood clots    ,DVT-left leg, and early 2000, had blood clot behind left breast  . History of kidney stones   . Hypercalcemia   . Hyperparathyroidism   . Hypertension    had been on Lisinopril and PCP took her off it 4 months ago  . Iron deficiency anemia   . Left thyroid nodule   . Mass of right ovary   .  Nephrolithiasis   . PAOD (peripheral arterial occlusive disease) ([place])   . Peripheral vascular disease ([place])   . Prolonged Q-T interval on ECG 04/19/2019  . Renal calculi   . Substance abuse ([place])   . Tooth loose    top tooth from the right  is loose     SURGICAL HISTORY: Past Surgical History:  Procedure Laterality Date  . ABDOMINAL AORTOGRAM W/LOWER EXTREMITY N/A 11/14/2017   Procedure: ABDOMINAL AORTOGRAM W/LOWER EXTREMITY;  Surgeon: Waynetta Sandy, MD;  Location: [place] [facility];  Service: Cardiovascular;  Laterality: N/A;  . ANGIOPLASTY ILLIAC ARTERY Left 02/25/2018   Procedure: BALLOON ANGIOPLASTY LEFT ILIAC  ARTERY;  Surgeon: Conrad [place], MD;  Location: [place];  Service: Vascular;  Laterality: Left;  . APPLICATION OF WOUND VAC Left 03/03/2018   Procedure: APPLICATION OF WOUND VAC;  Surgeon: Angelia Mould, MD;  Location: [place];  Service: Vascular;  Laterality: Left;  . CYSTOSCOPY W/ URETERAL STENT PLACEMENT Right 06/27/2018   Procedure: CYSTOSCOPY WITH RETROGRADE PYELOGRAM/URETERAL DOUBLE J STENT PLACEMENT;  Surgeon: Ceasar Mons, MD;  Location: [place];  Service: Urology;  Laterality: Right;  . CYSTOSCOPY WITH STENT PLACEMENT N/A 02/23/2019   Procedure: CYSTOSCOPY WITH RIGHT URETERAL STENT EXCHANGE/ LEFT URETERAL STENT PLACEMENT;  Surgeon: Ceasar Mons, MD;  Location: [facility];  Service: Urology;  Laterality: N/A;  . CYSTOSCOPY WITH STENT PLACEMENT Right 06/11/2019   Procedure: CYSTOSCOPY,RIGHT RETROGRADE WITH STENT PLACEMENT;  Surgeon: Irine Seal, MD;  Location: [facility];  Service: Urology;  Laterality: Right;  . CYSTOSCOPY/RETROGRADE/URETEROSCOPY/STONE EXTRACTION WITH BASKET Bilateral 05/01/2019   Procedure: CYSTOSCOPY/URETEROSCOPY/STONE EXTRACTION / LASER LITHOTRIPSY, BILATERAL URETEROSCOPY WITH URETERAL STONE EXTRACTION AND STENT EXCHANGE;  Surgeon: Ceasar Mons, MD;  Location: [facility];  Service: Urology;  Laterality: Bilateral;  . CYSTOSCOPY/URETEROSCOPY/HOLMIUM LASER/STENT PLACEMENT Right 06/27/2019   Procedure: CYSTOSCOPY/URETEROSCOPY/HOLMIUM LASER/STENT EXCHANGE;  Surgeon: Ceasar Mons, MD;  Location: [facility];  Service: Urology;  Laterality: Right;  . EMBOLECTOMY Left 02/24/2017   Procedure: Left Lower Extremity Embolectomy and Angiogram.;  Surgeon: Waynetta Sandy, MD;  Location: [place];  Service: Vascular;  Laterality: Left;  . INSERTION OF ILIAC STENT  02/24/2017   Procedure: INSERTION OF Common ILIAC STENT;  Surgeon: Waynetta Sandy, MD;  Location: [place];  Service: Vascular;;  . INTRAOPERATIVE ARTERIOGRAM  Left 02/25/2018   Procedure: INTRA OPERATIVE ARTERIOGRAM WITH LEFT LEG RUNOFF;  Surgeon: Conrad [place], MD;  Location: [place];  Service: Vascular;  Laterality: Left;  . LOWER EXTREMITY ANGIOGRAM Left 02/24/2017   Procedure: Aortagram, Left lower extremity Run-off;  Surgeon: Waynetta Sandy, MD;  Location: [place];  Service: Vascular;  Laterality: Left;  . PATCH ANGIOPLASTY Left 02/25/2018   Procedure: PATCH ANGIOPLASTY LEFT SUPERFICIAL FEMORAL ARTERY WITH BOVINE PATCH;  Surgeon: Conrad [place], MD;  Location: [place];  Service: Vascular;  Laterality: Left;  . PERCUTANEOUS VENOUS THROMBECTOMY,LYSIS WITH INTRAVASCULAR ULTRASOUND (IVUS) Left 05/12/2018   Procedure: MECHANICAL THROMBECTOMY LEFT LEG, BALLOON ANGIOPLASTY LEFT ANTERIOR TIBIAL ARTERY, AORTOGRAM WITH LEFT LEG RUNOFF;  Surgeon: Serafina Mitchell, MD;  Location: [place];  Service: Vascular;  Laterality: Left;  . 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 THROMBECTOMY FEMORAL ARTERY Left 02/25/2018   Procedure: LEFT ILIAC AND POPLITEAL ARTERY THROMBECTOMY;  Surgeon: Conrad [place], MD;  Location: [place];  Service: Vascular;  Laterality: Left;  . TUBAL LIGATION    . WOUND DEBRIDEMENT Left 03/03/2018   Procedure: DEBRIDEMENT WOUND;  Surgeon: [name]  S, MD;  Location: [place];  Service: Vascular;  Laterality: Left;  . WOUND EXPLORATION Left 03/03/2018   Procedure: WOUND EXPLORATION;  Surgeon: Angelia Mould, MD;  Location: [facility];  Service: Vascular;  Laterality: Left;    SOCIAL HISTORY: Social History   Socioeconomic History  . Marital status: Single    Spouse name: Not on file  . Number of children: Not on file  . Years of education: Not on file  . Highest education level: Not on file  Occupational History  . Not on file  Tobacco Use  . Smoking status: Current Some Day Smoker    Packs/day: 0.30    Years: 15.00    Pack years: 4.50    Types: Cigarettes  . Smokeless tobacco: Never Used  Substance and Sexual Activity  .  Alcohol use: Yes    Comment: occassionally  . Drug use: Yes    Types: Marijuana, Cocaine    Comment: once daily- last time used was night of 02/21/2019  . Sexual activity: Yes    Partners: Male    Birth control/protection: Other-see comments    Comment: BTL  Other Topics Concern  . Not on file  Social History Narrative  . Not on file   Social Determinants of Health   Financial Resource Strain:   . Difficulty of Paying Living Expenses:   Food Insecurity:   . Worried About Charity fundraiser in the Last Year:   . Arboriculturist in the Last Year:   Transportation Needs:   . Film/video editor (Medical):   Marland Kitchen Lack of Transportation (Non-Medical):   Physical Activity:   . Days of Exercise per Week:   . Minutes of Exercise per Session:   Stress:   . Feeling of Stress :   Social Connections:   . Frequency of Communication with Friends and Family:   . Frequency of Social Gatherings with Friends and Family:   . Attends Religious Services:   . Active Member of Clubs or Organizations:   . Attends Archivist Meetings:   Marland Kitchen Marital Status:   Intimate Partner Violence:   . Fear of Current or Ex-Partner:   . Emotionally Abused:   Marland Kitchen Physically Abused:   . Sexually Abused:     FAMILY HISTORY: Family History  Problem Relation Age of Onset  . Depression Mother   . Diabetes Father   . Heart failure Father     ALLERGIES:  is allergic to ciprofloxacin hcl; dilaudid [hydromorphone hcl]; macrobid [nitrofurantoin macrocrystal]; other; penicillins; sulfa antibiotics; and lexapro [escitalopram oxalate].  MEDICATIONS:  Current Outpatient Medications  Medication Sig Dispense Refill  . acetaminophen (TYLENOL) 500 MG tablet Take 2,000 mg by mouth daily as needed (for headaches).     Marland Kitchen albuterol (PROAIR HFA) 108 (90 Base) MCG/ACT inhaler Inhale 2 puffs into the lungs every 6 (six) hours as needed for wheezing or shortness of breath. 18 g 2  . amLODipine (NORVASC) 5 MG tablet  Take 1 tablet (5 mg total) by mouth daily. 30 tablet 11  . apixaban (ELIQUIS) 5 MG TABS tablet Take 1 tablet (5 mg total) by mouth 2 (two) times daily. 60 tablet 2  . aspirin EC 81 MG tablet Take 1 tablet (81 mg total) by mouth daily. 30 tablet 11  . atorvastatin (LIPITOR) 10 MG tablet Take 1 tablet (10 mg total) by mouth daily. 30 tablet 3  . blood glucose meter kit and supplies Dispense based on patient and insurance  preference. Use up to four times daily as directed. (FOR ICD-9 250.00, 250.01). 1 each 0  . Blood Glucose Monitoring Suppl (TRUE METRIX METER) w/Device KIT Use as directed 1 kit 0  . buPROPion (WELLBUTRIN SR) 150 MG 12 hr tablet Take 1 tablet (150 mg total) by mouth 2 (two) times daily. 60 tablet 11  . carvedilol (COREG) 3.125 MG tablet Take 1 tablet (3.125 mg total) by mouth 2 (two) times daily. 60 tablet 6  . ferrous sulfate 325 (65 FE) MG tablet Take 1 tablet (325 mg total) by mouth daily with breakfast. 30 tablet 3  . folic acid (FOLVITE) 1 MG tablet Take 1 tablet (1 mg total) by mouth daily. 30 tablet 3  . gabapentin (NEURONTIN) 400 MG capsule Take 1 capsule (400 mg total) by mouth 3 (three) times daily. 90 capsule 3  . glucose blood (TRUE METRIX BLOOD GLUCOSE TEST) test strip Use as instructed 100 each 12  . hydrOXYzine (ATARAX/VISTARIL) 50 MG tablet Take 1 tablet (50 mg total) by mouth 3 (three) times daily as needed for itching or anxiety. 90 tablet 4  . ibuprofen (ADVIL) 800 MG tablet Take 1 tablet (800 mg total) by mouth every 8 (eight) hours as needed. 60 tablet 1  . insulin aspart (NOVOLOG) 100 UNIT/ML injection Inject 5-15 Units into the skin 3 (three) times daily before meals. 10 mL 3  . insulin glargine (LANTUS) 100 UNIT/ML injection Inject 0.78 mLs (78 Units total) into the skin at bedtime. 10 mL 2  . Insulin Pen Needle 31G X 5 MM MISC Use with Lantus and Novolog injections. 100 each 11  . Insulin Syringe-Needle U-100 (BD INSULIN SYRINGE ULTRAFINE) 31G X 5/16" 0.5 ML  MISC Use as directed 100 each 3  . Insulin Syringes, Disposable, U-100 1 ML MISC Use as directed 100 each 0  . lip balm (CARMEX) ointment Apply topically as needed for lip care. 7 g 0  . loratadine (CLARITIN) 10 MG tablet Take 1 tablet (10 mg total) by mouth daily as needed for allergies. 30 tablet 2  . megestrol (MEGACE) 40 MG tablet Take 0.5 tablets (20 mg total) by mouth 2 (two) times daily. Can increase to two tablets twice a day in the event of heavy bleeding 60 tablet 5  . megestrol (MEGACE) 40 MG tablet Take 1 tablet (40 mg total) by mouth 2 (two) times daily. Can increase to two tablets twice a day in the event of heavy bleeding 60 tablet 5  . nitroGLYCERIN (NITROSTAT) 0.4 MG SL tablet Place 1 tablet (0.4 mg total) under the tongue every 5 (five) minutes as needed for chest pain. 25 tablet 3  . senna-docusate (SENOKOT-S) 8.6-50 MG tablet Take 1 tablet by mouth 2 (two) times daily. 60 tablet 2  . sertraline (ZOLOFT) 50 MG tablet Take 1 tablet (50 mg total) by mouth daily. 1/2 tab daily x 2 wks then 1 tab PO daily 30 tablet 4  . sucralfate (CARAFATE) 1 GM/10ML suspension Take 10 mLs (1 g total) by mouth 2 (two) times daily with breakfast and lunch. 200 mL 1  . TRUEplus Lancets 28G MISC Use as directed 100 each 4  . vitamin B-12 (CYANOCOBALAMIN) 1000 MCG tablet Take 2 tablets (2,000 mcg total) by mouth daily. 30 tablet 3  . omeprazole (PRILOSEC) 20 MG capsule Take 1 capsule (20 mg total) by mouth 2 (two) times daily before a meal. 60 capsule 3   No current facility-administered medications for this visit.    REVIEW  OF SYSTEMS:    10 Point review of Systems was done is negative except as noted above.  PHYSICAL EXAMINATION: ECOG PERFORMANCE STATUS: 1 - Symptomatic but completely ambulatory  . Vitals:   02/19/20 1456  BP: 124/78  Pulse: 86  Resp: 18  Temp: 97.8 F (36.6 C)  SpO2: 100%   Filed Weights   02/19/20 1456  Weight: 254 lb 4.8 oz (115.3 kg)   .Body mass index is 39.83  kg/m.  GENERAL:alert, in no acute distress and comfortable SKIN: no acute rashes, no significant lesions EYES: conjunctiva are pink and non-injected, sclera anicteric OROPHARYNX: MMM, no exudates, no oropharyngeal erythema or ulceration NECK: supple, no JVD LYMPH:  no palpable lymphadenopathy in the cervical, axillary or inguinal regions LUNGS: clear to auscultation b/l with normal respiratory effort HEART: regular rate & rhythm ABDOMEN:  normoactive bowel sounds , non tender, not distended. Extremity: no pedal edema PSYCH: alert & oriented x 3 with fluent speech NEURO: no focal motor/sensory deficits  LABORATORY DATA:  I have reviewed the data as listed  . CBC Latest Ref Rng & Units 02/19/2020 01/21/2020 09/24/2019  WBC 4.0 - 10.5 K/uL 13.6(H) 10.7 11.2(H)  Hemoglobin 12.0 - 15.0 g/dL 15.4(H) 14.6 12.8  Hematocrit 36.0 - 46.0 % 47.6(H) 44.1 40.4  Platelets 150 - 400 K/uL 367 - 275    . CMP Latest Ref Rng & Units 02/19/2020 01/21/2020 09/24/2019  Glucose 70 - 99 mg/dL 231(H) 220(H) 130(H)  BUN 6 - 20 mg/dL 15 15 10   Creatinine 0.44 - 1.00 mg/dL 1.88(H) 1.60(H) 1.68(H)  Sodium 135 - 145 mmol/L 138 138 135  Potassium 3.5 - 5.1 mmol/L 4.0 4.5 3.5  Chloride 98 - 111 mmol/L 107 103 105  CO2 22 - 32 mmol/L 19(L) 22 23  Calcium 8.9 - 10.3 mg/dL 9.5 10.1 9.1  Total Protein 6.5 - 8.1 g/dL 7.7 7.4 -  Total Bilirubin 0.3 - 1.2 mg/dL 0.4 <0.2 -  Alkaline Phos 38 - 126 U/L 91 108 -  AST 15 - 41 U/L 8(L) 12 -  ALT 0 - 44 U/L 9 9 -     RADIOGRAPHIC STUDIES: I have personally reviewed the radiological images as listed and agreed with the findings in the report. No results found.  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 Remote h/o of LUE ?DVT - record related to this are not available.  triggering events - smoking, ?cocaine use  PLAN: -Discussed patient's most recent  labs from 01/21/2020, all values are WNL except for Glucose at 220, Creatinine at 1.60, GFR Est Non Af Am at 39. -Advised pt that Megace will increase her risk of blood clots. Recommend she f/u with OBGYN for other menorrhagia management options.  -Advised pt that a hysterectomy would be an independent risk factor for blood clots. Pt will have to hold anticoagulation prior to and after surgery.  -Advised pt that smoking and cocaine use will be ongoing risk factors. Recommend complete cessation.  -Will execute a hypercoagulable work-up to complete risk profile based on FHx of blood clotting disorder -Would not recommend pt receive elective surgery until all risk factors are addressed.  -Recommend pt f/u with Dr. Margaretann Loveless for Cardiac clearance for surgery  -Continue 5 mg Eliquis BID -Will get labs today  -Will see back in 2 weeks via phone   FOLLOW UP: Labs today Phone visit with Dr  Kayven Aldaco in 2 weeks   All of the patients questions were answered with apparent satisfaction. The patient knows to call the clinic with any problems, questions or concerns.  I spent 35 mins counseling the patient face to face. The total time spent in the appointment was 45 minutes and more than 50% was on counseling and direct patient cares.    Sullivan Lone MD [place] AAHIVMS [facility] Hematology/Oncology Physician [facility]  (Office):       [phone] (Work cell):  [phone] (Fax):           [phone]  02/19/2020 4:54 PM  I, Yevette Edwards, am acting as a scribe for Dr. Sullivan Lone.   .I have reviewed the above documentation for accuracy and completeness, and I agree with the above. Brunetta Genera MD

## 2020-02-19 NOTE — Patient Instructions (Signed)
Thank you for choosing [facility] to provide your oncology and hematology care.   Should you have questions after your visit to the [facility] ([facility]), please contact this office at [phone] between 8:30 AM and 4:30 PM.  Voice mails left after 4:00 PM may not be returned until the following business day.  Calls received after 4:30 PM will be answered by an off-site Nurse Triage Line.    Prescription Refills:  Please have your pharmacy contact us directly for most prescription requests.  Contact the office directly for refills of narcotics (pain medications). Allow 48-72 hours for refills.  Appointments: Please contact the [facility] scheduling department [phone] for questions regarding [facility] appointment scheduling.  Contact the schedulers with any scheduling changes so that your appointment can be rescheduled in a timely manner.   Central Scheduling for [place] [phone] - Call to schedule procedures such as PET scans, CT scans, MRI, Ultrasound, etc.  To afford each patient quality time with our providers, please arrive 30 minutes before your scheduled appointment time.  If you arrive late for your appointment, you may be asked to reschedule.  We strive to give you quality time with our providers, and arriving late affects you and other patients whose appointments are after yours. If you are a no show for multiple scheduled visits, you may be dismissed from the clinic at the providers discretion.     Resources: [facility] Social Workers [phone] for additional information on assistance programs or assistance connecting with community support programs   [place] DSS  [phone]: Information regarding food stamps, Medicaid, and utility assistance [place] [phone]   [place] [employer]'s shared-ride transportation service for eligible riders who have a disability that prevents them from riding the fixed route bus.   Medicare Rights Center  800-333-4114 Helps people with Medicare understand their rights and benefits, navigate the Medicare system, and secure the quality healthcare they deserve [employer] 800-227-2345 Assists patients locate various types of support and financial assistance Cancer Care: 1-800-813-HOPE (4673) Provides financial assistance, online support groups, medication/co-pay assistance.   Transportation Assistance for appointments at [facility]: Transportation Coordinator [phone]  Again, thank you for choosing [facility] for your care.       

## 2020-02-20 LAB — BETA-2-GLYCOPROTEIN I ABS, IGG/M/A
Beta-2 Glyco I IgG: <9 GPI IgG units (ref 0–20)
Beta-2-Glycoprotein I IgA: <9 GPI IgA units (ref 0–25)
Beta-2-Glycoprotein I IgM: <9 GPI IgM units (ref 0–32)

## 2020-02-20 LAB — CARDIOLIPIN ANTIBODIES, IGG, IGM, IGA
Anticardiolipin IgA: <9 APL U/mL (ref 0–11)
Anticardiolipin IgG: <9 GPL U/mL (ref 0–14)
Anticardiolipin IgM: <9 MPL U/mL (ref 0–12)

## 2020-02-20 LAB — IRON AND TIBC
Iron: 127 ug/dL (ref 41–142)
Saturation Ratios: 35 % (ref 21–57)
TIBC: 364 ug/dL (ref 236–444)
UIBC: 237 ug/dL (ref 120–384)

## 2020-02-20 LAB — PROTEIN C, TOTAL: Protein C, Total: 132 % (ref 60–150)

## 2020-02-20 LAB — FERRITIN: Ferritin: 29 ng/mL (ref 11–307)

## 2020-02-21 LAB — PROTEIN C ACTIVITY: Protein C Activity: 118 % (ref 73–180)

## 2020-02-21 LAB — PROTEIN S PANEL
Protein S Activity: 122 % (ref 63–140)
Protein S Ag, Free: 143 % (ref 57–157)
Protein S Ag, Total: 137 % (ref 60–150)

## 2020-02-21 LAB — LUPUS ANTICOAGULANT PANEL
DRVVT: 48 s — ABNORMAL HIGH (ref 0.0–47.0)
PTT Lupus Anticoagulant: 30 s (ref 0.0–51.9)

## 2020-02-21 LAB — DRVVT MIX: dRVVT Mix: 40.3 s (ref 0.0–40.4)

## 2020-02-22 ENCOUNTER — Telehealth: Payer: Self-pay | Admitting: Hematology

## 2020-02-22 LAB — FACTOR 5 LEIDEN

## 2020-02-22 LAB — PROTHROMBIN GENE MUTATION

## 2020-02-22 NOTE — Telephone Encounter (Signed)
Scheduled per 05/18 los, patient has been called and voicemail as left.

## 2020-02-28 ENCOUNTER — Telehealth: Payer: Self-pay | Admitting: Hematology

## 2020-02-28 NOTE — Telephone Encounter (Signed)
Rescheduled 06/04 appointment to 06/11, called and informed patient why It was rescheduled. Patient is notified of new appointment.

## 2020-03-03 ENCOUNTER — Inpatient Hospital Stay (HOSPITAL_COMMUNITY)
Admission: EM | Admit: 2020-03-03 | Discharge: 2020-03-04 | DRG: 854 | Disposition: A | Discharge status: Home / Self Care | Payer: Medicaid Other | Attending: Family Medicine | Admitting: Family Medicine

## 2020-03-03 ENCOUNTER — Emergency Department (HOSPITAL_COMMUNITY): Payer: Medicaid Other

## 2020-03-03 ENCOUNTER — Inpatient Hospital Stay (HOSPITAL_COMMUNITY): Payer: Medicaid Other | Admitting: Certified Registered Nurse Anesthetist

## 2020-03-03 ENCOUNTER — Inpatient Hospital Stay (HOSPITAL_COMMUNITY): Payer: Medicaid Other

## 2020-03-03 ENCOUNTER — Encounter (HOSPITAL_COMMUNITY): Payer: Self-pay

## 2020-03-03 ENCOUNTER — Encounter (HOSPITAL_COMMUNITY)
Admission: EM | Disposition: A | Discharge status: Home / Self Care | Payer: Self-pay | Source: Home / Self Care | Attending: Internal Medicine

## 2020-03-03 ENCOUNTER — Other Ambulatory Visit: Payer: Self-pay

## 2020-03-03 DIAGNOSIS — N131 Hydronephrosis with ureteral stricture, not elsewhere classified: Secondary | ICD-10-CM | POA: Diagnosis not present

## 2020-03-03 DIAGNOSIS — E1165 Type 2 diabetes mellitus with hyperglycemia: Secondary | ICD-10-CM | POA: Diagnosis present

## 2020-03-03 DIAGNOSIS — E1151 Type 2 diabetes mellitus with diabetic peripheral angiopathy without gangrene: Secondary | ICD-10-CM | POA: Diagnosis present

## 2020-03-03 DIAGNOSIS — Z20822 Contact with and (suspected) exposure to covid-19: Secondary | ICD-10-CM | POA: Diagnosis present

## 2020-03-03 DIAGNOSIS — N390 Urinary tract infection, site not specified: Secondary | ICD-10-CM

## 2020-03-03 DIAGNOSIS — Z79818 Long term (current) use of other agents affecting estrogen receptors and estrogen levels: Secondary | ICD-10-CM

## 2020-03-03 DIAGNOSIS — F419 Anxiety disorder, unspecified: Secondary | ICD-10-CM | POA: Diagnosis present

## 2020-03-03 DIAGNOSIS — R109 Unspecified abdominal pain: Secondary | ICD-10-CM | POA: Diagnosis not present

## 2020-03-03 DIAGNOSIS — R9431 Abnormal electrocardiogram [ECG] [EKG]: Secondary | ICD-10-CM | POA: Diagnosis present

## 2020-03-03 DIAGNOSIS — Z7901 Long term (current) use of anticoagulants: Secondary | ICD-10-CM | POA: Diagnosis not present

## 2020-03-03 DIAGNOSIS — E871 Hypo-osmolality and hyponatremia: Secondary | ICD-10-CM | POA: Diagnosis present

## 2020-03-03 DIAGNOSIS — I129 Hypertensive chronic kidney disease with stage 1 through stage 4 chronic kidney disease, or unspecified chronic kidney disease: Secondary | ICD-10-CM | POA: Diagnosis present

## 2020-03-03 DIAGNOSIS — Z794 Long term (current) use of insulin: Secondary | ICD-10-CM

## 2020-03-03 DIAGNOSIS — I10 Essential (primary) hypertension: Secondary | ICD-10-CM | POA: Diagnosis not present

## 2020-03-03 DIAGNOSIS — Z833 Family history of diabetes mellitus: Secondary | ICD-10-CM | POA: Diagnosis not present

## 2020-03-03 DIAGNOSIS — N179 Acute kidney failure, unspecified: Secondary | ICD-10-CM | POA: Diagnosis present

## 2020-03-03 DIAGNOSIS — Z95820 Peripheral vascular angioplasty status with implants and grafts: Secondary | ICD-10-CM

## 2020-03-03 DIAGNOSIS — E1129 Type 2 diabetes mellitus with other diabetic kidney complication: Secondary | ICD-10-CM

## 2020-03-03 DIAGNOSIS — A4151 Sepsis due to Escherichia coli [E. coli]: Secondary | ICD-10-CM | POA: Diagnosis present

## 2020-03-03 DIAGNOSIS — F17210 Nicotine dependence, cigarettes, uncomplicated: Secondary | ICD-10-CM | POA: Diagnosis present

## 2020-03-03 DIAGNOSIS — E1122 Type 2 diabetes mellitus with diabetic chronic kidney disease: Secondary | ICD-10-CM | POA: Diagnosis not present

## 2020-03-03 DIAGNOSIS — F1410 Cocaine abuse, uncomplicated: Secondary | ICD-10-CM | POA: Diagnosis present

## 2020-03-03 DIAGNOSIS — R58 Hemorrhage, not elsewhere classified: Secondary | ICD-10-CM | POA: Diagnosis not present

## 2020-03-03 DIAGNOSIS — R509 Fever, unspecified: Secondary | ICD-10-CM

## 2020-03-03 DIAGNOSIS — M5489 Other dorsalgia: Secondary | ICD-10-CM | POA: Diagnosis not present

## 2020-03-03 DIAGNOSIS — E860 Dehydration: Secondary | ICD-10-CM | POA: Diagnosis present

## 2020-03-03 DIAGNOSIS — A419 Sepsis, unspecified organism: Secondary | ICD-10-CM | POA: Diagnosis not present

## 2020-03-03 DIAGNOSIS — Z87442 Personal history of urinary calculi: Secondary | ICD-10-CM

## 2020-03-03 DIAGNOSIS — Z8249 Family history of ischemic heart disease and other diseases of the circulatory system: Secondary | ICD-10-CM | POA: Diagnosis not present

## 2020-03-03 DIAGNOSIS — Z7982 Long term (current) use of aspirin: Secondary | ICD-10-CM

## 2020-03-03 DIAGNOSIS — N136 Pyonephrosis: Secondary | ICD-10-CM | POA: Diagnosis present

## 2020-03-03 DIAGNOSIS — N183 Chronic kidney disease, stage 3 unspecified: Secondary | ICD-10-CM | POA: Diagnosis not present

## 2020-03-03 DIAGNOSIS — R000 Tachycardia, unspecified: Secondary | ICD-10-CM | POA: Diagnosis not present

## 2020-03-03 DIAGNOSIS — F1210 Cannabis abuse, uncomplicated: Secondary | ICD-10-CM | POA: Diagnosis present

## 2020-03-03 DIAGNOSIS — Z79899 Other long term (current) drug therapy: Secondary | ICD-10-CM | POA: Diagnosis not present

## 2020-03-03 DIAGNOSIS — K219 Gastro-esophageal reflux disease without esophagitis: Secondary | ICD-10-CM | POA: Diagnosis present

## 2020-03-03 DIAGNOSIS — Z86718 Personal history of other venous thrombosis and embolism: Secondary | ICD-10-CM | POA: Diagnosis not present

## 2020-03-03 DIAGNOSIS — N920 Excessive and frequent menstruation with regular cycle: Secondary | ICD-10-CM | POA: Diagnosis present

## 2020-03-03 DIAGNOSIS — R1084 Generalized abdominal pain: Secondary | ICD-10-CM | POA: Diagnosis not present

## 2020-03-03 DIAGNOSIS — N1832 Chronic kidney disease, stage 3b: Secondary | ICD-10-CM | POA: Diagnosis not present

## 2020-03-03 DIAGNOSIS — N200 Calculus of kidney: Secondary | ICD-10-CM | POA: Diagnosis not present

## 2020-03-03 DIAGNOSIS — F329 Major depressive disorder, single episode, unspecified: Secondary | ICD-10-CM | POA: Diagnosis present

## 2020-03-03 DIAGNOSIS — D509 Iron deficiency anemia, unspecified: Secondary | ICD-10-CM | POA: Diagnosis present

## 2020-03-03 DIAGNOSIS — E119 Type 2 diabetes mellitus without complications: Secondary | ICD-10-CM

## 2020-03-03 HISTORY — PX: CYSTOSCOPY WITH STENT PLACEMENT: SHX5790

## 2020-03-03 LAB — CBC WITH DIFFERENTIAL/PLATELET
Abs Immature Granulocytes: 0.21 10*3/uL — ABNORMAL HIGH (ref 0.00–0.07)
Basophils Absolute: 0.1 10*3/uL (ref 0.0–0.1)
Basophils Relative: 0 %
Eosinophils Absolute: 0 10*3/uL (ref 0.0–0.5)
Eosinophils Relative: 0 %
HCT: 46.2 % — ABNORMAL HIGH (ref 36.0–46.0)
Hemoglobin: 15.3 g/dL — ABNORMAL HIGH (ref 12.0–15.0)
Immature Granulocytes: 1 %
Lymphocytes Relative: 5 %
Lymphs Abs: 1.5 10*3/uL (ref 0.7–4.0)
MCH: 29.7 pg (ref 26.0–34.0)
MCHC: 33.1 g/dL (ref 30.0–36.0)
MCV: 89.7 fL (ref 80.0–100.0)
Monocytes Absolute: 1.3 10*3/uL — ABNORMAL HIGH (ref 0.1–1.0)
Monocytes Relative: 4 %
Neutro Abs: 26.8 10*3/uL — ABNORMAL HIGH (ref 1.7–7.7)
Neutrophils Relative %: 90 %
Platelets: 336 10*3/uL (ref 150–400)
RBC: 5.15 MIL/uL — ABNORMAL HIGH (ref 3.87–5.11)
RDW: 14.1 % (ref 11.5–15.5)
WBC: 29.8 10*3/uL — ABNORMAL HIGH (ref 4.0–10.5)
nRBC: 0 % (ref 0.0–0.2)

## 2020-03-03 LAB — GLUCOSE, CAPILLARY
Glucose-Capillary: 191 mg/dL — ABNORMAL HIGH (ref 70–99)
Glucose-Capillary: 269 mg/dL — ABNORMAL HIGH (ref 70–99)
Glucose-Capillary: 272 mg/dL — ABNORMAL HIGH (ref 70–99)

## 2020-03-03 LAB — BASIC METABOLIC PANEL
Anion gap: 10 (ref 5–15)
BUN: 21 mg/dL — ABNORMAL HIGH (ref 6–20)
CO2: 19 mmol/L — ABNORMAL LOW (ref 22–32)
Calcium: 9.6 mg/dL (ref 8.9–10.3)
Chloride: 102 mmol/L (ref 98–111)
Creatinine, Ser: 2.42 mg/dL — ABNORMAL HIGH (ref 0.44–1.00)
GFR calc Af Amer: 27 mL/min — ABNORMAL LOW (ref >60)
GFR calc non Af Amer: 23 mL/min — ABNORMAL LOW (ref >60)
Glucose, Bld: 446 mg/dL — ABNORMAL HIGH (ref 70–99)
Potassium: 4.2 mmol/L (ref 3.5–5.1)
Sodium: 131 mmol/L — ABNORMAL LOW (ref 135–145)

## 2020-03-03 LAB — RAPID URINE DRUG SCREEN, HOSP PERFORMED
Amphetamines: NOT DETECTED
Barbiturates: NOT DETECTED
Benzodiazepines: NOT DETECTED
Cocaine: POSITIVE — AB
Opiates: POSITIVE — AB
Tetrahydrocannabinol: POSITIVE — AB

## 2020-03-03 LAB — URINALYSIS, ROUTINE W REFLEX MICROSCOPIC
Bilirubin Urine: NEGATIVE
Glucose, UA: >=500 mg/dL — AB
Ketones, ur: NEGATIVE mg/dL
Nitrite: NEGATIVE
Protein, ur: 100 mg/dL — AB
Specific Gravity, Urine: 1.01 (ref 1.005–1.030)
WBC, UA: >50 WBC/hpf — ABNORMAL HIGH (ref 0–5)
pH: 5 (ref 5.0–8.0)

## 2020-03-03 LAB — I-STAT BETA HCG BLOOD, ED (MC, WL, AP ONLY): I-stat hCG, quantitative: <5 m[IU]/mL (ref <5)

## 2020-03-03 LAB — LACTIC ACID, PLASMA: Lactic Acid, Venous: 2.1 mmol/L (ref 0.5–1.9)

## 2020-03-03 LAB — SARS CORONAVIRUS 2 BY RT PCR (HOSPITAL ORDER, PERFORMED IN ~~LOC~~ HOSPITAL LAB): SARS Coronavirus 2: NEGATIVE

## 2020-03-03 LAB — CBG MONITORING, ED: Glucose-Capillary: 238 mg/dL — ABNORMAL HIGH (ref 70–99)

## 2020-03-03 SURGERY — CYSTOSCOPY, WITH STENT INSERTION
Anesthesia: General | Site: Ureter | Laterality: Right

## 2020-03-03 MED ORDER — HYDROXYZINE HCL 25 MG PO TABS
50.0000 mg | ORAL_TABLET | Freq: Three times a day (TID) | ORAL | Status: DC | PRN
  Administered 2020-03-03 – 2020-03-04 (×3): 50 mg via ORAL
  Filled 2020-03-03 (×3): qty 2

## 2020-03-03 MED ORDER — FENTANYL CITRATE (PF) 100 MCG/2ML IJ SOLN: 25.0000 ug | INTRAMUSCULAR | Status: DC | PRN

## 2020-03-03 MED ORDER — ONDANSETRON HCL 4 MG/2ML IJ SOLN
4.0000 mg | Freq: Once | INTRAMUSCULAR | Status: AC
  Administered 2020-03-03: 4 mg via INTRAVENOUS
  Filled 2020-03-03: qty 2

## 2020-03-03 MED ORDER — MIDAZOLAM HCL 2 MG/2ML IJ SOLN
INTRAMUSCULAR | Status: AC
  Filled 2020-03-03: qty 2

## 2020-03-03 MED ORDER — PROPOFOL 10 MG/ML IV BOLUS
INTRAVENOUS | Status: DC | PRN
  Administered 2020-03-03: 180 mg via INTRAVENOUS
  Administered 2020-03-03: 20 mg via INTRAVENOUS

## 2020-03-03 MED ORDER — LIDOCAINE 2% (20 MG/ML) 5 ML SYRINGE
INTRAMUSCULAR | Status: DC | PRN
  Administered 2020-03-03: 60 mg via INTRAVENOUS

## 2020-03-03 MED ORDER — SODIUM CHLORIDE 0.9 % IV SOLN
1.0000 g | Freq: Once | INTRAVENOUS | Status: AC
  Administered 2020-03-03: 1 g via INTRAVENOUS
  Filled 2020-03-03: qty 10

## 2020-03-03 MED ORDER — GABAPENTIN 400 MG PO CAPS
400.0000 mg | ORAL_CAPSULE | Freq: Three times a day (TID) | ORAL | Status: DC
  Administered 2020-03-03 – 2020-03-04 (×3): 400 mg via ORAL
  Filled 2020-03-03 (×3): qty 1

## 2020-03-03 MED ORDER — FENTANYL CITRATE (PF) 100 MCG/2ML IJ SOLN
INTRAMUSCULAR | Status: AC
  Administered 2020-03-03: 50 ug via INTRAVENOUS
  Filled 2020-03-03: qty 2

## 2020-03-03 MED ORDER — ONDANSETRON HCL 4 MG/2ML IJ SOLN
INTRAMUSCULAR | Status: AC
  Filled 2020-03-03: qty 2

## 2020-03-03 MED ORDER — FENTANYL CITRATE (PF) 100 MCG/2ML IJ SOLN
INTRAMUSCULAR | Status: DC | PRN
  Administered 2020-03-03 (×2): 50 ug via INTRAVENOUS

## 2020-03-03 MED ORDER — ACETAMINOPHEN 160 MG/5ML PO SOLN: 325.0000 mg | Freq: Once | ORAL | Status: DC | PRN

## 2020-03-03 MED ORDER — ACETAMINOPHEN 10 MG/ML IV SOLN: 1000.0000 mg | Freq: Once | INTRAVENOUS | Status: DC | PRN

## 2020-03-03 MED ORDER — SUCRALFATE 1 GM/10ML PO SUSP
1.0000 g | Freq: Two times a day (BID) | ORAL | Status: DC
  Administered 2020-03-04 (×2): 1 g via ORAL
  Filled 2020-03-03 (×2): qty 10

## 2020-03-03 MED ORDER — SUCCINYLCHOLINE CHLORIDE 200 MG/10ML IV SOSY
PREFILLED_SYRINGE | INTRAVENOUS | Status: DC | PRN
  Administered 2020-03-03: 140 mg via INTRAVENOUS

## 2020-03-03 MED ORDER — PROMETHAZINE HCL 25 MG/ML IJ SOLN
6.2500 mg | Freq: Once | INTRAMUSCULAR | Status: AC
  Administered 2020-03-03: 12.5 mg via INTRAVENOUS

## 2020-03-03 MED ORDER — MORPHINE SULFATE (PF) 4 MG/ML IV SOLN
4.0000 mg | Freq: Once | INTRAVENOUS | Status: AC
  Administered 2020-03-03: 4 mg via INTRAVENOUS
  Filled 2020-03-03: qty 1

## 2020-03-03 MED ORDER — VITAMIN B-12 1000 MCG PO TABS
2000.0000 ug | ORAL_TABLET | Freq: Every day | ORAL | Status: DC
  Administered 2020-03-03 – 2020-03-04 (×2): 2000 ug via ORAL
  Filled 2020-03-03 (×2): qty 2

## 2020-03-03 MED ORDER — NICOTINE 21 MG/24HR TD PT24
21.0000 mg | MEDICATED_PATCH | Freq: Every day | TRANSDERMAL | Status: DC
  Administered 2020-03-03 – 2020-03-04 (×2): 21 mg via TRANSDERMAL
  Filled 2020-03-03 (×2): qty 1

## 2020-03-03 MED ORDER — FENTANYL CITRATE (PF) 100 MCG/2ML IJ SOLN
INTRAMUSCULAR | Status: AC
  Filled 2020-03-03: qty 2

## 2020-03-03 MED ORDER — PROCHLORPERAZINE EDISYLATE 10 MG/2ML IJ SOLN: 10.0000 mg | Freq: Four times a day (QID) | INTRAMUSCULAR | Status: DC | PRN

## 2020-03-03 MED ORDER — SODIUM CHLORIDE 0.9 % IV SOLN
2.0000 g | INTRAVENOUS | Status: DC
  Administered 2020-03-04: 2 g via INTRAVENOUS
  Filled 2020-03-03: qty 2

## 2020-03-03 MED ORDER — ATORVASTATIN CALCIUM 10 MG PO TABS
10.0000 mg | ORAL_TABLET | Freq: Every day | ORAL | Status: DC
  Administered 2020-03-03 – 2020-03-04 (×2): 10 mg via ORAL
  Filled 2020-03-03 (×2): qty 1

## 2020-03-03 MED ORDER — SODIUM CHLORIDE 0.9 % IV SOLN: INTRAVENOUS | Status: DC

## 2020-03-03 MED ORDER — SODIUM CHLORIDE 0.9 % IR SOLN
Status: DC | PRN
  Administered 2020-03-03: 3000 mL

## 2020-03-03 MED ORDER — PROMETHAZINE HCL 25 MG/ML IJ SOLN
INTRAMUSCULAR | Status: AC
  Filled 2020-03-03: qty 1

## 2020-03-03 MED ORDER — INSULIN ASPART 100 UNIT/ML ~~LOC~~ SOLN
0.0000 [IU] | SUBCUTANEOUS | Status: DC
  Administered 2020-03-03: 3 [IU] via SUBCUTANEOUS
  Filled 2020-03-03: qty 0.09

## 2020-03-03 MED ORDER — ACETAMINOPHEN 325 MG PO TABS: 325.0000 mg | ORAL_TABLET | Freq: Once | ORAL | Status: DC | PRN

## 2020-03-03 MED ORDER — INSULIN GLARGINE 100 UNIT/ML ~~LOC~~ SOLN
55.0000 [IU] | Freq: Every day | SUBCUTANEOUS | Status: DC
  Administered 2020-03-03: 55 [IU] via SUBCUTANEOUS
  Filled 2020-03-03: qty 0.55

## 2020-03-03 MED ORDER — ONDANSETRON HCL 4 MG/2ML IJ SOLN: 4.0000 mg | Freq: Once | INTRAMUSCULAR | Status: DC

## 2020-03-03 MED ORDER — SENNOSIDES-DOCUSATE SODIUM 8.6-50 MG PO TABS
1.0000 | ORAL_TABLET | Freq: Two times a day (BID) | ORAL | Status: DC
  Administered 2020-03-03 – 2020-03-04 (×2): 1 via ORAL
  Filled 2020-03-03 (×2): qty 1

## 2020-03-03 MED ORDER — ONDANSETRON HCL 4 MG/2ML IJ SOLN
INTRAMUSCULAR | Status: AC
  Administered 2020-03-03: 4 mg
  Filled 2020-03-03: qty 2

## 2020-03-03 MED ORDER — SERTRALINE HCL 50 MG PO TABS
50.0000 mg | ORAL_TABLET | Freq: Every day | ORAL | Status: DC
  Administered 2020-03-03 – 2020-03-04 (×2): 50 mg via ORAL
  Filled 2020-03-03 (×2): qty 1

## 2020-03-03 MED ORDER — INSULIN ASPART 100 UNIT/ML ~~LOC~~ SOLN
0.0000 [IU] | Freq: Three times a day (TID) | SUBCUTANEOUS | Status: DC
  Administered 2020-03-03: 5 [IU] via SUBCUTANEOUS
  Administered 2020-03-04: 3 [IU] via SUBCUTANEOUS
  Administered 2020-03-04: 2 [IU] via SUBCUTANEOUS

## 2020-03-03 MED ORDER — LORAZEPAM 2 MG/ML IJ SOLN
1.0000 mg | Freq: Once | INTRAMUSCULAR | Status: AC
  Administered 2020-03-03: 1 mg via INTRAVENOUS
  Filled 2020-03-03: qty 1

## 2020-03-03 MED ORDER — ASPIRIN EC 81 MG PO TBEC
81.0000 mg | DELAYED_RELEASE_TABLET | Freq: Every day | ORAL | Status: DC
  Administered 2020-03-04: 81 mg via ORAL
  Filled 2020-03-03: qty 1

## 2020-03-03 MED ORDER — PROMETHAZINE HCL 25 MG/ML IJ SOLN: 12.5000 mg | Freq: Once | INTRAMUSCULAR | Status: DC | PRN

## 2020-03-03 MED ORDER — SODIUM CHLORIDE 0.9 % IV BOLUS
1000.0000 mL | Freq: Once | INTRAVENOUS | Status: AC
  Administered 2020-03-03: 1000 mL via INTRAVENOUS

## 2020-03-03 MED ORDER — LACTATED RINGERS IV SOLN: INTRAVENOUS | Status: DC

## 2020-03-03 MED ORDER — ONDANSETRON HCL 4 MG/2ML IJ SOLN
4.0000 mg | Freq: Four times a day (QID) | INTRAMUSCULAR | Status: DC | PRN
  Administered 2020-03-03 – 2020-03-04 (×2): 4 mg via INTRAVENOUS
  Filled 2020-03-03 (×2): qty 2

## 2020-03-03 MED ORDER — MIDAZOLAM HCL 5 MG/5ML IJ SOLN
INTRAMUSCULAR | Status: DC | PRN
  Administered 2020-03-03: 2 mg via INTRAVENOUS

## 2020-03-03 MED ORDER — MEPERIDINE HCL 50 MG/ML IJ SOLN: 6.2500 mg | INTRAMUSCULAR | Status: DC | PRN

## 2020-03-03 MED ORDER — PANTOPRAZOLE SODIUM 40 MG PO TBEC
40.0000 mg | DELAYED_RELEASE_TABLET | Freq: Every day | ORAL | Status: DC
  Administered 2020-03-03 – 2020-03-04 (×2): 40 mg via ORAL
  Filled 2020-03-03 (×2): qty 1

## 2020-03-03 MED ORDER — 0.9 % SODIUM CHLORIDE (POUR BTL) OPTIME
TOPICAL | Status: DC | PRN
  Administered 2020-03-03: 1000 mL

## 2020-03-03 MED ORDER — FOLIC ACID 1 MG PO TABS
1.0000 mg | ORAL_TABLET | Freq: Every day | ORAL | Status: DC
  Administered 2020-03-03 – 2020-03-04 (×2): 1 mg via ORAL
  Filled 2020-03-03 (×2): qty 1

## 2020-03-03 MED ORDER — IOHEXOL 300 MG/ML  SOLN
INTRAMUSCULAR | Status: DC | PRN
  Administered 2020-03-03: 5 mL

## 2020-03-03 MED ORDER — ONDANSETRON HCL 4 MG/2ML IJ SOLN
INTRAMUSCULAR | Status: DC | PRN
  Administered 2020-03-03: 4 mg via INTRAVENOUS

## 2020-03-03 MED ORDER — PROPOFOL 10 MG/ML IV BOLUS
INTRAVENOUS | Status: AC
  Filled 2020-03-03: qty 20

## 2020-03-03 MED ORDER — ALBUTEROL SULFATE (2.5 MG/3ML) 0.083% IN NEBU: 2.5000 mg | INHALATION_SOLUTION | Freq: Four times a day (QID) | RESPIRATORY_TRACT | Status: DC | PRN

## 2020-03-03 MED ORDER — MORPHINE SULFATE (PF) 2 MG/ML IV SOLN
1.0000 mg | INTRAVENOUS | Status: DC | PRN
  Administered 2020-03-03 – 2020-03-04 (×7): 2 mg via INTRAVENOUS
  Filled 2020-03-03 (×7): qty 1

## 2020-03-03 MED ORDER — CARVEDILOL 3.125 MG PO TABS
3.1250 mg | ORAL_TABLET | Freq: Two times a day (BID) | ORAL | Status: DC
  Administered 2020-03-03 – 2020-03-04 (×3): 3.125 mg via ORAL
  Filled 2020-03-03 (×3): qty 1

## 2020-03-03 MED ORDER — LIDOCAINE 2% (20 MG/ML) 5 ML SYRINGE
INTRAMUSCULAR | Status: AC
  Filled 2020-03-03: qty 5

## 2020-03-03 MED ORDER — LABETALOL HCL 5 MG/ML IV SOLN: 5.0000 mg | INTRAVENOUS | Status: DC | PRN

## 2020-03-03 SURGICAL SUPPLY — 11 items
BAG URO CATCHER STRL LF (MISCELLANEOUS) ×2 IMPLANT
CATH URET 5FR 28IN OPEN ENDED (CATHETERS) ×2 IMPLANT
CLOTH BEACON ORANGE TIMEOUT ST (SAFETY) ×2 IMPLANT
GLOVE BIOGEL M STRL SZ7.5 (GLOVE) ×2 IMPLANT
GOWN STRL REUS W/TWL XL LVL3 (GOWN DISPOSABLE) ×2 IMPLANT
GUIDEWIRE STR DUAL SENSOR (WIRE) ×2 IMPLANT
KIT TURNOVER KIT A (KITS) IMPLANT
MANIFOLD NEPTUNE II (INSTRUMENTS) ×2 IMPLANT
STENT URET 6FRX24 CONTOUR (STENTS) ×2 IMPLANT
TRAY CYSTO PACK (CUSTOM PROCEDURE TRAY) ×2 IMPLANT
TUBING CONNECTING 10 (TUBING) IMPLANT

## 2020-03-03 NOTE — Op Note (Signed)
Preoperative diagnosis: right obstructing ureteral stone Postoperative diagnosis: Same  Procedures performed: Cystoscopy, right retrograde pyelogram with interpretation, right ureteral stent placement  Surgeon: Dr. Ardis Hughs  Findings: A retrograde pyelogram was performed, contrast was instilled into the right collecting system and there was a small filling defect in the ureter at the expected location of the stone and proximal hydronephrosis. There were no other abnormalities.  Specimens:right renal pelvis urine culture  Indication: Terri Sparks is a 44 y.o. patient with right distal ureteral stone and concern for UTI. After reviewing the management options for treatment, he elected to proceed with the above surgical procedure(s). We have discussed the potential benefits and risks of the procedure, side effects of the proposed treatment, the likelihood of the patient achieving the goals of the procedure, and any potential problems that might occur during the procedure or recuperation. Informed consent has been obtained.  Procedure in detail: Patient was consented prior to being brought back to the operating room. He was then brought back to the operating room placed on the table in supine position. General anesthesia was then induced and endotracheal tube inserted. This then placed in dorsolithotomy position and prepped and draped in the routine sterile fashion. A timeout was held.  Using a 22.5 [place] cystoscope with a 30  lens, I gently passed the scope into the patient's urethra and into the bladder under visual guidance. A 360 cystoscopic evaluation was performed with no mucosal abnormalities, no tumors, and no foreign bodies identified. I then passed a 0. 038 Sensor wire into the right ureteral orifice and into the right renal pelvis. I then slid a 5 [place] open-ended ureteral catheter over the wire and into the renal pelvis and removed the wire. Next I injected 10 cc of contrast  into the renal collecting system and performed a retrograde pyelogram. I then replaced the wire through the open-ended ureteral catheter and remove the catheter. I then slid a 24 cm x 6 French double-J ureteral stent over the wire and into the renal pelvis under fluoroscopic guidance. Once a nice curl was noted in the renal pelvis I advanced the distal end of the stent into the bladder before removing the wire completely. A final fluoroscopic image was obtained confirming the curl in the renal pelvis as well as a curl in the bladder.   Disposition: The patient returned to the PACU in stable condition.

## 2020-03-03 NOTE — ED Provider Notes (Signed)
[facility] DEPT Provider Note   CSN: [id number] Arrival date & time: 03/03/20  0303     History Chief Complaint  Patient presents with  . Flank Pain    Terri Sparks is a 44 y.o. female.  The history is provided by medical records and the patient.  Flank Pain    44 year old female with history of anxiety, depression, diabetes, GERD, history of DVT on Eliquis, hypertension, prolonged QT, peripheral vascular disease, presenting to the ED with right-sided flank pain.  States it started yesterday around 1 PM.  She reports associated nausea and vomiting.  Also has developed low-grade fever on arrival to ED.  She reports history of stones in the past with similar symptoms.  She has also had infected stones requiring stenting in the past.  She does report some difficulty urinating, often feels urge to go and only able to produce a few drops.  She has not noticed any dysuria.  No pelvic pain or vaginal discharge.  She does currently have heavy menses, this is being monitored by her OB/GYN.  She is currently on Megace.  There was concerned she may need hysterectomy, however given her clotting disorder I have advised against this temporarily.  Past Medical History:  Diagnosis Date  . Anxiety   . Depression   . Diabetes ([place])    Type 2  . GERD (gastroesophageal reflux disease)   . History of blood clots    ,DVT-left leg, and early 2000, had blood clot behind left breast  . History of kidney stones   . Hypercalcemia   . Hyperparathyroidism   . Hypertension    had been on Lisinopril and PCP took her off it 4 months ago  . Iron deficiency anemia   . Left thyroid nodule   . Mass of right ovary   . Nephrolithiasis   . PAOD (peripheral arterial occlusive disease) ([place])   . Peripheral vascular disease ([place])   . Prolonged Q-T interval on ECG 04/19/2019  . Renal calculi   . Substance abuse ([place])   . Tooth loose    top tooth from the right  is loose      Patient Active Problem List   Diagnosis Date Noted  . Homeless 11/13/2019  . Chest pain at rest 09/24/2019  . Gastritis 09/24/2019  . Sinus tachycardia 09/24/2019  . Sepsis secondary to UTI ([place]) 09/23/2019  . Polysubstance abuse ([place]) 09/23/2019  . Atypical chest pain   . Stage 3b chronic kidney disease 07/30/2019  . Iron deficiency anemia due to chronic blood loss 06/12/2019  . Vitamin B12 deficiency 06/12/2019  . Folate deficiency 06/12/2019  . Constipation 06/12/2019  . Prolonged QT interval   . Ureteral stone with hydronephrosis 06/11/2019  . Acute renal failure superimposed on stage 3 chronic kidney disease ([place]) 06/26/2018  . Right ureteral stone 06/26/2018  . Essential hypertension 06/26/2018  . Subtherapeutic international normalized ratio (INR) 06/26/2018  . Adrenal nodule ([place]) 06/21/2018  . Lung nodules 06/21/2018  . Abnormal uterine bleeding (AUB) 05/14/2018  . Hemorrhagic cyst of right ovary 05/14/2018  . Ischemia of extremity 05/12/2018  . Former smoker 03/14/2018  . 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 Thromboembolism ([place]) 02/25/2018  . Microalbuminuria 09/29/2017  . Insomnia 09/29/2017  . Immunization due 08/01/2017  . Anxiety and depression 04/28/2017  . 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 Tobacco abuse 02/25/2017  . Insulin-requiring or dependent type  II diabetes mellitus ([place]) 02/25/2017  . Peripheral vascular disease of lower extremity ([place]) 02/24/2017  . GERD (gastroesophageal reflux disease)   . Hyperparathyroidism     Past Surgical History:  Procedure Laterality Date  . ABDOMINAL AORTOGRAM W/LOWER EXTREMITY N/A 11/14/2017   Procedure: ABDOMINAL AORTOGRAM W/LOWER EXTREMITY;  Surgeon: Waynetta Sandy, MD;  Location: [facility];  Service: Cardiovascular;  Laterality: N/A;  . ANGIOPLASTY ILLIAC ARTERY Left 02/25/2018   Procedure: BALLOON ANGIOPLASTY  LEFT ILIAC ARTERY;  Surgeon: Conrad Ojo [place], MD;  Location: [facility];  Service: Vascular;  Laterality: Left;  . APPLICATION OF WOUND VAC Left 03/03/2018   Procedure: APPLICATION OF WOUND VAC;  Surgeon: Angelia Mould, MD;  Location: [facility];  Service: Vascular;  Laterality: Left;  . CYSTOSCOPY W/ URETERAL STENT PLACEMENT Right 06/27/2018   Procedure: CYSTOSCOPY WITH RETROGRADE PYELOGRAM/URETERAL DOUBLE J STENT PLACEMENT;  Surgeon: Ceasar Mons, MD;  Location: [facility];  Service: Urology;  Laterality: Right;  . CYSTOSCOPY WITH STENT PLACEMENT N/A 02/23/2019   Procedure: CYSTOSCOPY WITH RIGHT URETERAL STENT EXCHANGE/ LEFT URETERAL STENT PLACEMENT;  Surgeon: Ceasar Mons, MD;  Location: [facility];  Service: Urology;  Laterality: N/A;  . CYSTOSCOPY WITH STENT PLACEMENT Right 06/11/2019   Procedure: CYSTOSCOPY,RIGHT RETROGRADE WITH STENT PLACEMENT;  Surgeon: Irine Seal, MD;  Location: [facility];  Service: Urology;  Laterality: Right;  . CYSTOSCOPY/RETROGRADE/URETEROSCOPY/STONE EXTRACTION WITH BASKET Bilateral 05/01/2019   Procedure: CYSTOSCOPY/URETEROSCOPY/STONE EXTRACTION / LASER LITHOTRIPSY, BILATERAL URETEROSCOPY WITH URETERAL STONE EXTRACTION AND STENT EXCHANGE;  Surgeon: Ceasar Mons, MD;  Location: [facility];  Service: Urology;  Laterality: Bilateral;  . CYSTOSCOPY/URETEROSCOPY/HOLMIUM LASER/STENT PLACEMENT Right 06/27/2019   Procedure: CYSTOSCOPY/URETEROSCOPY/HOLMIUM LASER/STENT EXCHANGE;  Surgeon: Ceasar Mons, MD;  Location[facility];  Service: Urology;  Laterality: Right;  . EMBOLECTOMY Left 02/24/2017   Procedure: Left Lower Extremity Embolectomy and Angiogram.;  Surgeon: Waynetta Sandy, MD;  Location: [facility];  Service: Vascular;  Laterality: Left;  . INSERTION OF ILIAC STENT  02/24/2017   Procedure: INSERTION OF Common ILIAC STENT;  Surgeon: Waynetta Sandy, MD;  Location: [facility];  Service: Vascular;;  . INTRAOPERATIVE  ARTERIOGRAM Left 02/25/2018   Procedure: INTRA OPERATIVE ARTERIOGRAM WITH LEFT LEG RUNOFF;  Surgeon: Conrad Savona, MD;  Location: [facility];  Service: Vascular;  Laterality: Left;  . LOWER EXTREMITY ANGIOGRAM Left 02/24/2017   Procedure: Aortagram, Left lower extremity Run-off;  Surgeon: Waynetta Sandy, MD;  Location: [facility];  Service: Vascular;  Laterality: Left;  . PATCH ANGIOPLASTY Left 02/25/2018   Procedure: PATCH ANGIOPLASTY LEFT SUPERFICIAL FEMORAL ARTERY WITH BOVINE PATCH;  Surgeon: Conrad [place], MD;  Location: [facility];  Service: Vascular;  Laterality: Left;  . PERCUTANEOUS VENOUS THROMBECTOMY,LYSIS WITH INTRAVASCULAR ULTRASOUND (IVUS) Left 05/12/2018   Procedure: MECHANICAL THROMBECTOMY LEFT LEG, BALLOON ANGIOPLASTY LEFT ANTERIOR TIBIAL ARTERY, AORTOGRAM WITH LEFT LEG RUNOFF;  Surgeon: Serafina Mitchell, MD;  Location: [facility];  Service: Vascular;  Laterality: Left;  . 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 THROMBECTOMY FEMORAL ARTERY Left 02/25/2018   Procedure: LEFT ILIAC AND POPLITEAL ARTERY THROMBECTOMY;  Surgeon: Conrad [place], MD;  Location: [facility];  Service: Vascular;  Laterality: Left;  . TUBAL LIGATION    . WOUND DEBRIDEMENT Left 03/03/2018   Procedure: DEBRIDEMENT WOUND;  Surgeon: Angelia Mould, MD;  Location: [facility];  Service: Vascular;  Laterality: Left;  . WOUND EXPLORATION Left 03/03/2018   Procedure: WOUND EXPLORATION;  Surgeon: Angelia Mould, MD;  [facility];  Service: Vascular;  Laterality: Left;     OB History   No obstetric history on file.     Family History  Problem Relation Age of Onset  . Depression Mother   . Diabetes Father   . Heart failure Father     Social History   Tobacco Use  . Smoking status: Current Some Day Smoker    Packs/day: 0.30    Years: 15.00    Pack years: 4.50    Types: Cigarettes  . Smokeless tobacco: Never Used  Substance Use Topics  . Alcohol use: Yes    Comment: occassionally  . Drug use: Yes    Types:  Marijuana, Cocaine    Comment: once daily- last time used was night of 02/21/2019    Home Medications Prior to Admission medications   Medication Sig Start Date End Date Taking? Authorizing Provider  acetaminophen (TYLENOL) 500 MG tablet Take 2,000 mg by mouth daily as needed (for headaches).     [provider]  albuterol (PROAIR HFA) 108 (90 Base) MCG/ACT inhaler Inhale 2 puffs into the lungs every 6 (six) hours as needed for wheezing or shortness of breath. 09/24/19   Darliss Cheney, MD  amLODipine (NORVASC) 5 MG tablet Take 1 tablet (5 mg total) by mouth daily. 09/24/19   Darliss Cheney, MD  apixaban (ELIQUIS) 5 MG TABS tablet Take 1 tablet (5 mg total) by mouth 2 (two) times daily. 11/12/19   Ladell Pier, MD  aspirin EC 81 MG tablet Take 1 tablet (81 mg total) by mouth daily. 09/24/19   Darliss Cheney, MD  atorvastatin (LIPITOR) 10 MG tablet Take 1 tablet (10 mg total) by mouth daily. 01/23/20   Ladell Pier, MD  blood glucose meter kit and supplies Dispense based on patient and insurance preference. Use up to four times daily as directed. (FOR ICD-9 250.00, 250.01). 03/03/17   Cristal Ford, DO  Blood Glucose Monitoring Suppl (TRUE METRIX METER) w/Device KIT Use as directed 11/13/19   Ladell Pier, MD  buPROPion [facility] SR) 150 MG 12 hr tablet Take 1 tablet (150 mg total) by mouth 2 (two) times daily. 09/24/19   Darliss Cheney, MD  carvedilol (COREG) 3.125 MG tablet Take 1 tablet (3.125 mg total) by mouth 2 (two) times daily. 02/01/20 05/01/20  Elouise Munroe, MD  ferrous sulfate 325 (65 FE) MG tablet Take 1 tablet (325 mg total) by mouth daily with breakfast. 09/24/19   Darliss Cheney, MD  folic acid (FOLVITE) 1 MG tablet Take 1 tablet (1 mg total) by mouth daily. 09/24/19   Darliss Cheney, MD  gabapentin (NEURONTIN) 400 MG capsule Take 1 capsule (400 mg total) by mouth 3 (three) times daily. 09/24/19   Darliss Cheney, MD  glucose blood (TRUE METRIX BLOOD GLUCOSE  TEST) test strip Use as instructed 11/13/19   Ladell Pier, MD  hydrOXYzine (ATARAX/VISTARIL) 50 MG tablet Take 1 tablet (50 mg total) by mouth 3 (three) times daily as needed for itching or anxiety. 09/24/19   Darliss Cheney, MD  ibuprofen (ADVIL) 800 MG tablet Take 1 tablet (800 mg total) by mouth every 8 (eight) hours as needed. 11/20/19   Constant, Peggy, MD  insulin aspart (NOVOLOG) 100 UNIT/ML injection Inject 5-15 Units into the skin 3 (three) times daily before meals. 09/24/19   Darliss Cheney, MD  insulin glargine (LANTUS) 100 UNIT/ML injection Inject 0.78 mLs (78 Units total) into the skin at bedtime. 01/21/20   Ladell Pier,  MD  Insulin Pen Needle 31G X 5 MM MISC Use with Lantus and Novolog injections. 04/19/19   Ladell Pier, MD  Insulin Syringe-Needle U-100 (BD INSULIN SYRINGE ULTRAFINE) 31G X 5/16" 0.5 ML MISC Use as directed 04/28/17   Ladell Pier, MD  Insulin Syringes, Disposable, U-100 1 ML MISC Use as directed 09/24/19   Darliss Cheney, MD  lip balm (CARMEX) ointment Apply topically as needed for lip care. 06/16/19   Jani Gravel, MD  loratadine (CLARITIN) 10 MG tablet Take 1 tablet (10 mg total) by mouth daily as needed for allergies. 09/24/19   Darliss Cheney, MD  megestrol (MEGACE) 40 MG tablet Take 0.5 tablets (20 mg total) by mouth 2 (two) times daily. Can increase to two tablets twice a day in the event of heavy bleeding 10/17/19   Chancy Milroy, MD  megestrol (MEGACE) 40 MG tablet Take 1 tablet (40 mg total) by mouth 2 (two) times daily. Can increase to two tablets twice a day in the event of heavy bleeding 12/27/19   Chancy Milroy, MD  nitroGLYCERIN (NITROSTAT) 0.4 MG SL tablet Place 1 tablet (0.4 mg total) under the tongue every 5 (five) minutes as needed for chest pain. 09/24/19   Darliss Cheney, MD  omeprazole (PRILOSEC) 20 MG capsule Take 1 capsule (20 mg total) by mouth 2 (two) times daily before a meal. 09/24/19 12/27/19  Darliss Cheney, MD  senna-docusate  (SENOKOT-S) 8.6-50 MG tablet Take 1 tablet by mouth 2 (two) times daily. 09/24/19   Darliss Cheney, MD  sertraline (ZOLOFT) 50 MG tablet Take 1 tablet (50 mg total) by mouth daily. 1/2 tab daily x 2 wks then 1 tab PO daily 09/24/19   Darliss Cheney, MD  sucralfate (CARAFATE) 1 GM/10ML suspension Take 10 mLs (1 g total) by mouth 2 (two) times daily with breakfast and lunch. 11/13/19   Ladell Pier, MD  TRUEplus Lancets 28G MISC Use as directed 11/13/19   Ladell Pier, MD  vitamin B-12 (CYANOCOBALAMIN) 1000 MCG tablet Take 2 tablets (2,000 mcg total) by mouth daily. 09/24/19   Darliss Cheney, MD    Allergies    Ciprofloxacin hcl, Dilaudid [hydromorphone hcl], Macrobid [nitrofurantoin macrocrystal], Other, Penicillins, Sulfa antibiotics, and Lexapro [escitalopram oxalate]  Review of Systems   Review of Systems  Gastrointestinal: Positive for nausea and vomiting.  Genitourinary: Positive for flank pain.  All other systems reviewed and are negative.   Physical Exam Updated Vital Signs BP 140/74 (BP Location: Left Arm)   Pulse (!) 132   Temp 100 F (37.8 C) (Oral)   Resp 20   SpO2 98%   Physical Exam Vitals and nursing note reviewed.  Constitutional:      Appearance: She is well-developed.     Comments: Obese, Actively vomiting  HENT:     Head: Normocephalic and atraumatic.  Eyes:     Conjunctiva/sclera: Conjunctivae normal.     Pupils: Pupils are equal, round, and reactive to light.  Cardiovascular:     Rate and Rhythm: Regular rhythm. Tachycardia present.     Heart sounds: Normal heart sounds.     Comments: Tachy 130's during exam Pulmonary:     Effort: Pulmonary effort is normal.     Breath sounds: Normal breath sounds. No stridor. No wheezing.  Abdominal:     General: Bowel sounds are normal.     Palpations: Abdomen is soft.     Tenderness: There is no abdominal tenderness. There is right CVA tenderness. There is  no rebound.  Musculoskeletal:        General: Normal  range of motion.     Cervical back: Normal range of motion.  Skin:    General: Skin is warm and dry.  Neurological:     Mental Status: She is alert and oriented to person, place, and time.     ED Results / Procedures / Treatments   Labs (all labs ordered are listed, but only abnormal results are displayed) Labs Reviewed  CBC WITH DIFFERENTIAL/PLATELET - Abnormal; Notable for the following components:      Result Value   WBC 29.8 (*)    RBC 5.15 (*)    Hemoglobin 15.3 (*)    HCT 46.2 (*)    Neutro Abs 26.8 (*)    Monocytes Absolute 1.3 (*)    Abs Immature Granulocytes 0.21 (*)    All other components within normal limits  BASIC METABOLIC PANEL - Abnormal; Notable for the following components:   Sodium 131 (*)    CO2 19 (*)    Glucose, Bld 446 (*)    BUN 21 (*)    Creatinine, Ser 2.42 (*)    GFR calc non Af Amer 23 (*)    GFR calc Af Amer 27 (*)    All other components within normal limits  URINALYSIS, ROUTINE W REFLEX MICROSCOPIC - Abnormal; Notable for the following components:   APPearance CLOUDY (*)    Glucose, UA >=500 (*)    Hgb urine dipstick LARGE (*)    Protein, ur 100 (*)    Leukocytes,Ua LARGE (*)    WBC, UA >50 (*)    Bacteria, UA MANY (*)    All other components within normal limits  LACTIC ACID, PLASMA - Abnormal; Notable for the following components:   Lactic Acid, Venous 2.1 (*)    All other components within normal limits  URINE CULTURE  CULTURE, BLOOD (ROUTINE X 2)  CULTURE, BLOOD (ROUTINE X 2)  SARS CORONAVIRUS 2 BY RT PCR (HOSPITAL ORDER, [facility])  I-STAT BETA HCG BLOOD, ED ([facility], [facility], [facility] ONLY)    EKG None  Radiology CT Renal Stone Study  Result Date: 03/03/2020 CLINICAL DATA:  Right flank pain, vomiting, fever. EXAM: CT ABDOMEN AND PELVIS WITHOUT CONTRAST TECHNIQUE: Multidetector CT imaging of the abdomen and pelvis was performed following the standard protocol without IV contrast. COMPARISON:  09/22/2019 CT  abdomen/pelvis. FINDINGS: Lower chest: No significant pulmonary nodules or acute consolidative airspace disease. Hepatobiliary: Normal liver size. No liver mass. Normal gallbladder with no radiopaque cholelithiasis. No biliary ductal dilatation. Pancreas: Normal, with no mass or duct dilation. Spleen: Normal size. No mass. Adrenals/Urinary Tract: Normal right adrenal. Stable left adrenal 2.4 cm nodule with density 5 HU, compatible with a benign adenoma. Obstructing 4 mm right ureterovesical junction stone with mild to moderate right hydroureteronephrosis. Additional nonobstructing 5 mm and 3 mm lower right renal stones. Extensive right perinephric fat stranding and ill-defined fluid. Multiple nonobstructing left renal stones, largest 7 mm in the interpolar left kidney. Asymmetric moderate left renal cortical scarring. No left hydronephrosis. No contour deforming renal masses. Normal caliber left ureter. No additional ureteral stones. Nonspecific mild diffuse right renal collecting system and right ureter urothelial wall thickening. Scattered gas in the left greater the right renal collecting systems. Small amount of gas in the nondependent bladder. Otherwise normal bladder. Stomach/Bowel: Small hiatal hernia. Otherwise normal nondistended stomach. Normal caliber small bowel with no small bowel wall thickening. Normal appendix. Normal large  bowel with no diverticulosis, large bowel wall thickening or pericolonic fat stranding. Vascular/Lymphatic: Atherosclerotic nonaneurysmal abdominal aorta. Left common iliac artery stent. No pathologically enlarged lymph nodes in the abdomen or pelvis. Reproductive: Grossly normal uterus.  No adnexal mass. Other: No pneumoperitoneum, ascites or focal fluid collection. Musculoskeletal: No aggressive appearing focal osseous lesions. Mild thoracolumbar spondylosis. IMPRESSION: 1. Obstructing 4 mm right UVJ stone with mild to moderate right hydroureteronephrosis. Additional  nonobstructing stones in both kidneys. 2. Nonspecific scattered gas in the left greater the right renal collecting systems and in the nondependent bladder. Findings are nonspecific and could be due to recent bladder instrumentation or gas-forming infection. 3. Nonspecific diffuse mild urothelial wall thickening in the right renal collecting system and right ureter, cannot exclude superimposed infection. 4. Asymmetric moderate left renal cortical scarring. 5. Small hiatal hernia. 6. Stable left adrenal adenoma. 7. Aortic Atherosclerosis (ICD10-I70.0). Electronically Signed   By: Ilona Sorrel M.D.   On: 03/03/2020 05:47    Procedures Procedures (including critical care time)  CRITICAL CARE Performed by: Larene Pickett   Total critical care time: 45 minutes  Critical care time was exclusive of separately billable procedures and treating other patients.  Critical care was necessary to treat or prevent imminent or life-threatening deterioration.  Critical care was time spent personally by me on the following activities: development of treatment plan with patient and/or surrogate as well as nursing, discussions with consultants, evaluation of patient's response to treatment, examination of patient, obtaining history from patient or surrogate, ordering and performing treatments and interventions, ordering and review of laboratory studies, ordering and review of radiographic studies, pulse oximetry and re-evaluation of patient's condition.   Medications Ordered in ED Medications  cefTRIAXone (ROCEPHIN) 1 g in sodium chloride 0.9 % 100 mL IVPB (1 g Intravenous New Bag/Given 03/03/20 0542)  morphine 4 MG/ML injection 4 mg (4 mg Intravenous Given 03/03/20 0404)  LORazepam (ATIVAN) injection 1 mg (1 mg Intravenous Given 03/03/20 0404)  sodium chloride 0.9 % bolus 1,000 mL (0 mLs Intravenous Stopped 03/03/20 0535)    ED Course  I have reviewed the triage vital signs and the nursing notes.  Pertinent  labs & imaging results that were available during my care of the patient were reviewed by me and considered in my medical decision making (see chart for details).    MDM Rules/Calculators/A&P  44 year old female presenting to the ED with right-sided flank pain that began yesterday around 1 PM.  She has had low-grade fever here and is tachycardic into the 130s.  She does report history of stones in the past requiring stenting.  She does have right-sided CVA tenderness.  Does report some difficulty urinating recently.  Clinical concern for possible infected stone given fever and tachycardia.  Labs, lactate, blood cultures pending.  Will get CT renal stone study.  She does have history of prolonged QT so will obtain EKG prior to giving antiemetics.  She was given morphine and Ativan.  EKG with normal QT, no acute ischemic changes.  Labs with elevated lactate at 2.1, white blood cell count nearly 30K.  Does have evidence of AKI as well.  CT renal study with findings of obstructive 4 mm calculus on right.  Does have some gas noted in both kidneys, L>R.  Patient reports this is known on her left side, unsure if it was present on right before.  Patient will require admission.  She has been given Rocephin.  Patient does have complex medical history including uncontrolled DM. Will  ask medicine to admit and consult urology.  covid swab sent.  5:58 AM Discussed with urology, Dr. Louis Meckel-- will see patient this morning in consult.    Discussed with hospitalist, Dr. Olevia Bowens-- hospitalist will admit for ongoing care.  Final Clinical Impression(s) / ED Diagnoses Final diagnoses:  Kidney stone  Flank pain  Fever, unspecified fever cause    Rx / DC Orders ED Discharge Orders    None       Larene Pickett, PA-C 03/03/20 0312    Ezequiel Essex, MD 03/03/20 0900

## 2020-03-03 NOTE — ED Triage Notes (Signed)
Pt reports flank pain and heavy period x 2 weeks, hx kidney disease, diabetes. CBG 465. 190/110, Pulse 142, Ambulatory on arrival.

## 2020-03-03 NOTE — Progress Notes (Signed)
The patient is status post right ureteral stent for an infected right obstructing ureteral stone.  The procedure was uncomplicated.  The patient tolerated it well.  We will plan for the patient to follow-up with [name] in 2 weeks for removal of her stones.  Someone from my office will contact her with a surgery date.  She should be treated with 2 weeks of culture specific antibiotics.  Please contact us directly for any additional questions or concerns.

## 2020-03-03 NOTE — Anesthesia Preprocedure Evaluation (Addendum)
Anesthesia Evaluation  Patient identified by MRN, date of birth, ID band Patient awake    Reviewed: Allergy & Precautions, NPO status , Patient's Chart, lab work & pertinent test results  Airway Mallampati: II  TM Distance: >3 FB Neck ROM: Full    Dental  (+) Loose, Poor Dentition, Missing,    Pulmonary Current Smoker,    breath sounds clear to auscultation       Cardiovascular hypertension, Pt. on medications and Pt. on home beta blockers + Peripheral Vascular Disease   Rhythm:Regular Rate:Normal     Neuro/Psych PSYCHIATRIC DISORDERS Anxiety Depression  Neuromuscular disease    GI/Hepatic Neg liver ROS, GERD  Medicated,  Endo/Other  diabetes, Type 2, Insulin Dependent  Renal/GU      Musculoskeletal   Abdominal Normal abdominal exam  (+)   Peds  Hematology   Anesthesia Other Findings   Reproductive/Obstetrics                            Anesthesia Physical Anesthesia Plan  ASA: III  Anesthesia Plan: General   Post-op Pain Management:    Induction: Intravenous, Rapid sequence and Cricoid pressure planned  PONV Risk Score and Plan: 3 and Ondansetron, Dexamethasone and Midazolam  Airway Management Planned: Oral ETT  Additional Equipment: None  Intra-op Plan:   Post-operative Plan: Extubation in OR  Informed Consent: I have reviewed the patients History and Physical, chart, labs and discussed the procedure including the risks, benefits and alternatives for the proposed anesthesia with the patient or authorized representative who has indicated his/her understanding and acceptance.     Dental advisory given  Plan Discussed with: CRNA  Anesthesia Plan Comments: (Echo:  1. Left ventricular ejection fraction, by visual estimation, is 45 to  50%. The left ventricle has normal function. There is no left ventricular  hypertrophy.  2. Left ventricular diastolic parameters are  indeterminate.  3. The left ventricle demonstrates global hypokinesis.  4. Global right ventricle has normal systolic function.The right  ventricular size is normal. No increase in right ventricular wall  thickness.  5. Left atrial size was normal.  6. Right atrial size was normal.  7. The mitral valve is normal in structure. Trivial mitral valve  regurgitation. No evidence of mitral stenosis.  8. The tricuspid valve is normal in structure. Tricuspid valve  regurgitation is trivial.  9. The aortic valve is normal in structure. Aortic valve regurgitation is  not visualized. No evidence of aortic valve sclerosis or stenosis.  10. The pulmonic valve was normal in structure. Pulmonic valve  regurgitation is trivial.  11. TR signal is inadequate for assessing pulmonary artery systolic  pressure.  12. The inferior vena cava is normal in size with greater than 50%  respiratory variability, suggesting right atrial pressure of 3 mmHg. )       Anesthesia Quick Evaluation

## 2020-03-03 NOTE — Anesthesia Procedure Notes (Signed)
Procedure Name: Intubation Date/Time: 03/03/2020 10:38 AM Performed by: Maxwell Caul, CRNA Pre-anesthesia Checklist: Patient identified, Emergency Drugs available, Suction available and Patient being monitored Patient Re-evaluated:Patient Re-evaluated prior to induction Oxygen Delivery Method: Circle system utilized Preoxygenation: Pre-oxygenation with 100% oxygen Induction Type: IV induction, Rapid sequence and Cricoid Pressure applied Laryngoscope Size: Mac and 4 Grade View: Grade I Tube type: Oral Tube size: 7.0 mm Number of attempts: 1 Airway Equipment and Method: Stylet Placement Confirmation: ETT inserted through vocal cords under direct vision,  positive ETCO2 and breath sounds checked- equal and bilateral Secured at: 21 cm Tube secured with: Tape Dental Injury: Teeth and Oropharynx as per pre-operative assessment

## 2020-03-03 NOTE — Anesthesia Postprocedure Evaluation (Signed)
Anesthesia Post Note  Patient: Terri Sparks  Procedure(s) Performed: CYSTOSCOPY WITH STENT PLACEMENT retroagrade pylerogram (Right Ureter)     Patient location during evaluation: PACU Anesthesia Type: General Level of consciousness: awake and alert Pain management: pain level controlled Vital Signs Assessment: post-procedure vital signs reviewed and stable Respiratory status: spontaneous breathing, nonlabored ventilation, respiratory function stable and patient connected to nasal cannula oxygen Cardiovascular status: blood pressure returned to baseline and stable Postop Assessment: no apparent nausea or vomiting Anesthetic complications: no    Last Vitals:  Vitals:   03/03/20 1230 03/03/20 1240  BP: 95/62 110/79  Pulse: (!) 108 (!) 108  Resp: 18   Temp:    SpO2: 99% 100%                  Effie Berkshire

## 2020-03-03 NOTE — Transfer of Care (Signed)
Immediate Anesthesia Transfer of Care Note  Patient: Terri Sparks  Procedure(s) Performed: CYSTOSCOPY WITH STENT PLACEMENT retroagrade pylerogram (Right Ureter)  Patient Location: PACU  Anesthesia Type:General  Level of Consciousness: awake, alert  and oriented  Airway & Oxygen Therapy: Patient Spontanous Breathing and Patient connected to face mask oxygen  Post-op Assessment: Report given to RN and Post -op Vital signs reviewed and stable  Post vital signs: Reviewed and stable  Last Vitals:  Vitals Value Taken Time  BP    Temp    Pulse 103 03/03/20 1110  Resp 29 03/03/20 1110  SpO2 100 % 03/03/20 1110  Vitals shown include unvalidated device data.  Last Pain:  Vitals:   03/03/20 0840  TempSrc:   PainSc: Asleep         Complications: No apparent anesthesia complications

## 2020-03-03 NOTE — ED Notes (Signed)
PACU wanting patient to sign consent before she comes for procedure but patient has not spoke with urology.  Spoke with Dr. Louis Meckel and he wants patient brought to PACU for him to see and then they will obtain consent.

## 2020-03-03 NOTE — H&P (Signed)
TRH H&P   Patient Demographics:    Terri Sparks, is a 44 y.o. female  MRN: [id number]   DOB - 11-Jun-1976  Admit Date - 03/03/2020  Outpatient Primary MD for the patient is Ladell Pier, MD  Referring MD/NP/PA: Paula Compton  Patient coming from: home  Chief Complaint  Patient presents with  . Flank Pain      HPI:    Terri Sparks  is a 44 y.o. female, with past medical history of anxiety, depression, diabetes mellitus, insulin-dependent, GERD, history of recurrent DVT on Eliquis, hypertension, prolonged QT, peripheral vascular disease, patient presented to ED secondary to right-sided flank pain, reports started yesterday in the afternoon, as well she does report some nausea and vomiting over last 24 hours, she does report feeling febrile earlier this morning, does report similar presentation in the past secondary to kidney stones, where she required stenting in the past, she denies any dysuria, polyuria, or hematuria, but she does report difficulty urinating, no pelvic pain, or vaginal discharge, reports usually having heavy.,  Especially she is on Eliquis, as well she is taking Megace for that . -In ED patient was noted to be tachycardic at 102, with leukocytosis at 29K, with elevated lactic acid at 2.1, CT renal showing obstructing 4 mm right UVJ stone with mild to moderate hydronephrosis, patient was started on IV Rocephin, and urology were consulted, patient was noted to worsening renal function, creatinine was 2.4, up from baseline 1.6-1.8, as well she was noted to be hyperglycemic with glucose of 446, patient did not take her bedtime Lantus yesterday secondary to nausea and vomiting, [facility] consulted to admit .      Review of systems:    In addition to the HPI above,  No Fever-chills, No Headache, No changes with Vision or hearing, No problems swallowing food  or Liquids, No Chest pain, Cough or Shortness of Breath, No Abdominal pain, she reports nausea and vomiting, Bowel movements are regular, No Blood in stool or Urine, No dysuria, reports flank pain No new skin rashes or bruises, No new joints pains-aches,  No new weakness, tingling, numbness in any extremity, No recent weight gain or loss, No polyuria, polydypsia or polyphagia, No significant Mental Stressors.  A full 10 point Review of Systems was done, except as stated above, all other Review of Systems were negative.   With Past History of the following :    Past Medical History:  Diagnosis Date  . Anxiety   . Depression   . Diabetes ([place])    Type 2  . GERD (gastroesophageal reflux disease)   . History of blood clots    ,DVT-left leg, and early 2000, had blood clot behind left breast  . History of kidney stones   . Hypercalcemia   . Hyperparathyroidism   . Hypertension    had been on Lisinopril and PCP took her off  it 4 months ago  . Iron deficiency anemia   . Left thyroid nodule   . Mass of right ovary   . Nephrolithiasis   . PAOD (peripheral arterial occlusive disease) ([place])   . Peripheral vascular disease ([place])   . Prolonged Q-T interval on ECG 04/19/2019  . Renal calculi   . Substance abuse ([place])   . Tooth loose    top tooth from the right  is loose       Past Surgical History:  Procedure Laterality Date  . ABDOMINAL AORTOGRAM W/LOWER EXTREMITY N/A 11/14/2017   Procedure: ABDOMINAL AORTOGRAM W/LOWER EXTREMITY;  Surgeon: Waynetta Sandy, MD;  Location: [place] [facility];  Service: Cardiovascular;  Laterality: N/A;  . ANGIOPLASTY ILLIAC ARTERY Left 02/25/2018   Procedure: BALLOON ANGIOPLASTY LEFT ILIAC ARTERY;  Surgeon: Conrad Sunrise, MD;  Location: [place];  Service: Vascular;  Laterality: Left;  . APPLICATION OF WOUND VAC Left 03/03/2018   Procedure: APPLICATION OF WOUND VAC;  Surgeon: Angelia Mould, MD;  Location: [place];  Service: Vascular;   Laterality: Left;  . CYSTOSCOPY W/ URETERAL STENT PLACEMENT Right 06/27/2018   Procedure: CYSTOSCOPY WITH RETROGRADE PYELOGRAM/URETERAL DOUBLE J STENT PLACEMENT;  Surgeon: Ceasar Mons, MD;  Location: [place];  Service: Urology;  Laterality: Right;  . CYSTOSCOPY WITH STENT PLACEMENT N/A 02/23/2019   Procedure: CYSTOSCOPY WITH RIGHT URETERAL STENT EXCHANGE/ LEFT URETERAL STENT PLACEMENT;  Surgeon: Ceasar Mons, MD;  [facility];  Service: Urology;  Laterality: N/A;  . CYSTOSCOPY WITH STENT PLACEMENT Right 06/11/2019   Procedure: CYSTOSCOPY,RIGHT RETROGRADE WITH STENT PLACEMENT;  Surgeon: Irine Seal, MD;  Location[facility];  Service: Urology;  Laterality: Right;  . CYSTOSCOPY/RETROGRADE/URETEROSCOPY/STONE EXTRACTION WITH BASKET Bilateral 05/01/2019   Procedure: CYSTOSCOPY/URETEROSCOPY/STONE EXTRACTION / LASER LITHOTRIPSY, BILATERAL URETEROSCOPY WITH URETERAL STONE EXTRACTION AND STENT EXCHANGE;  Surgeon: Ceasar Mons, MD;  Location: [facility];  Service: Urology;  Laterality: Bilateral;  . CYSTOSCOPY/URETEROSCOPY/HOLMIUM LASER/STENT PLACEMENT Right 06/27/2019   Procedure: CYSTOSCOPY/URETEROSCOPY/HOLMIUM LASER/STENT EXCHANGE;  Surgeon: Ceasar Mons, MD;  [facility];  Service: Urology;  Laterality: Right;  . EMBOLECTOMY Left 02/24/2017   Procedure: Left Lower Extremity Embolectomy and Angiogram.;  Surgeon: Waynetta Sandy, MD;  Location: [place];  Service: Vascular;  Laterality: Left;  . INSERTION OF ILIAC STENT  02/24/2017   Procedure: INSERTION OF Common ILIAC STENT;  Surgeon: Waynetta Sandy, MD;  Location: [place];  Service: Vascular;;  . INTRAOPERATIVE ARTERIOGRAM Left 02/25/2018   Procedure: INTRA OPERATIVE ARTERIOGRAM WITH LEFT LEG RUNOFF;  Surgeon: Conrad Vintondale, MD;  Location: [place];  Service: Vascular;  Laterality: Left;  . LOWER EXTREMITY ANGIOGRAM Left 02/24/2017   Procedure: Aortagram, Left lower extremity  Run-off;  Surgeon: Waynetta Sandy, MD;  Location: [place];  Service: Vascular;  Laterality: Left;  . PATCH ANGIOPLASTY Left 02/25/2018   Procedure: PATCH ANGIOPLASTY LEFT SUPERFICIAL FEMORAL ARTERY WITH BOVINE PATCH;  Surgeon: Conrad Cherokee, MD;  Location: [place];  Service: Vascular;  Laterality: Left;  . PERCUTANEOUS VENOUS THROMBECTOMY,LYSIS WITH INTRAVASCULAR ULTRASOUND (IVUS) Left 05/12/2018   Procedure: MECHANICAL THROMBECTOMY LEFT LEG, BALLOON ANGIOPLASTY LEFT ANTERIOR TIBIAL ARTERY, AORTOGRAM WITH LEFT LEG RUNOFF;  Surgeon: Serafina Mitchell, MD;  Location: [place];  Service: Vascular;  Laterality: Left;  . 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 THROMBECTOMY FEMORAL ARTERY Left 02/25/2018   Procedure: LEFT ILIAC AND POPLITEAL ARTERY THROMBECTOMY;  Surgeon: Conrad [place], MD;  Location: [place];  Service:  Vascular;  Laterality: Left;  . TUBAL LIGATION    . WOUND DEBRIDEMENT Left 03/03/2018   Procedure: DEBRIDEMENT WOUND;  Surgeon: Angelia Mould, MD;  Location: [place];  Service: Vascular;  Laterality: Left;  . WOUND EXPLORATION Left 03/03/2018   Procedure: WOUND EXPLORATION;  Surgeon: Angelia Mould, MD;  Location[facility];  Service: Vascular;  Laterality: Left;      Social History:     Social History   Tobacco Use  . Smoking status: Current Some Day Smoker    Packs/day: 0.30    Years: 15.00    Pack years: 4.50    Types: Cigarettes  . Smokeless tobacco: Never Used  Substance Use Topics  . Alcohol use: Yes    Comment: occassionally       Family History :     Family History  Problem Relation Age of Onset  . Depression Mother   . Diabetes Father   . Heart failure Father       Home Medications:   Prior to Admission medications   Medication Sig Start Date End Date Taking? Authorizing Provider  acetaminophen (TYLENOL) 500 MG tablet Take 2,000 mg by mouth daily as needed (for headaches).    Yes [provider]  albuterol (PROAIR HFA) 108 (90 Base)  MCG/ACT inhaler Inhale 2 puffs into the lungs every 6 (six) hours as needed for wheezing or shortness of breath. 09/24/19  Yes Pahwani, Einar Grad, MD  amLODipine (NORVASC) 5 MG tablet Take 1 tablet (5 mg total) by mouth daily. 09/24/19  Yes Pahwani, Einar Grad, MD  apixaban (ELIQUIS) 5 MG TABS tablet Take 1 tablet (5 mg total) by mouth 2 (two) times daily. 11/12/19  Yes Ladell Pier, MD  aspirin EC 81 MG tablet Take 1 tablet (81 mg total) by mouth daily. 09/24/19  Yes Pahwani, Einar Grad, MD  atorvastatin (LIPITOR) 10 MG tablet Take 1 tablet (10 mg total) by mouth daily. 01/23/20  Yes Ladell Pier, MD  buPROPion [facility] SR) 150 MG 12 hr tablet Take 1 tablet (150 mg total) by mouth 2 (two) times daily. 09/24/19  Yes Pahwani, Einar Grad, MD  carvedilol (COREG) 3.125 MG tablet Take 1 tablet (3.125 mg total) by mouth 2 (two) times daily. 02/01/20 05/01/20 Yes Elouise Munroe, MD  ferrous sulfate 325 (65 FE) MG tablet Take 1 tablet (325 mg total) by mouth daily with breakfast. 09/24/19  Yes Pahwani, Einar Grad, MD  folic acid (FOLVITE) 1 MG tablet Take 1 tablet (1 mg total) by mouth daily. 09/24/19  Yes Darliss Cheney, MD  gabapentin (NEURONTIN) 400 MG capsule Take 1 capsule (400 mg total) by mouth 3 (three) times daily. 09/24/19  Yes Darliss Cheney, MD  hydrOXYzine (ATARAX/VISTARIL) 50 MG tablet Take 1 tablet (50 mg total) by mouth 3 (three) times daily as needed for itching or anxiety. 09/24/19  Yes Darliss Cheney, MD  ibuprofen (ADVIL) 800 MG tablet Take 1 tablet (800 mg total) by mouth every 8 (eight) hours as needed. 11/20/19  Yes Constant, Peggy, MD  insulin aspart (NOVOLOG) 100 UNIT/ML injection Inject 5-15 Units into the skin 3 (three) times daily before meals. 09/24/19  Yes Pahwani, Einar Grad, MD  insulin glargine (LANTUS) 100 UNIT/ML injection Inject 0.78 mLs (78 Units total) into the skin at bedtime. 01/21/20  Yes Ladell Pier, MD  loratadine (CLARITIN) 10 MG tablet Take 1 tablet (10 mg total) by mouth daily as  needed for allergies. 09/24/19  Yes Darliss Cheney, MD  megestrol (MEGACE) 40 MG tablet Take  1 tablet (40 mg total) by mouth 2 (two) times daily. Can increase to two tablets twice a day in the event of heavy bleeding 12/27/19  Yes Chancy Milroy, MD  nitroGLYCERIN (NITROSTAT) 0.4 MG SL tablet Place 1 tablet (0.4 mg total) under the tongue every 5 (five) minutes as needed for chest pain. 09/24/19  Yes Darliss Cheney, MD  omeprazole (PRILOSEC) 20 MG capsule Take 1 capsule (20 mg total) by mouth 2 (two) times daily before a meal. 09/24/19 03/03/20 Yes Pahwani, Einar Grad, MD  sertraline (ZOLOFT) 50 MG tablet Take 1 tablet (50 mg total) by mouth daily. 1/2 tab daily x 2 wks then 1 tab PO daily Patient taking differently: Take 50 mg by mouth daily.  09/24/19  Yes Pahwani, Einar Grad, MD  sucralfate (CARAFATE) 1 GM/10ML suspension Take 10 mLs (1 g total) by mouth 2 (two) times daily with breakfast and lunch. 11/13/19  Yes Ladell Pier, MD  vitamin B-12 (CYANOCOBALAMIN) 1000 MCG tablet Take 2 tablets (2,000 mcg total) by mouth daily. 09/24/19  Yes Darliss Cheney, MD  blood glucose meter kit and supplies Dispense based on patient and insurance preference. Use up to four times daily as directed. (FOR ICD-9 250.00, 250.01). 03/03/17   Cristal Ford, DO  Blood Glucose Monitoring Suppl (TRUE METRIX METER) w/Device KIT Use as directed 11/13/19   Ladell Pier, MD  glucose blood (TRUE METRIX BLOOD GLUCOSE TEST) test strip Use as instructed 11/13/19   Ladell Pier, MD  Insulin Pen Needle 31G X 5 MM MISC Use with Lantus and Novolog injections. 04/19/19   Ladell Pier, MD  Insulin Syringe-Needle U-100 (BD INSULIN SYRINGE ULTRAFINE) 31G X 5/16" 0.5 ML MISC Use as directed 04/28/17   Ladell Pier, MD  Insulin Syringes, Disposable, U-100 1 ML MISC Use as directed 09/24/19   Darliss Cheney, MD  lip balm (CARMEX) ointment Apply topically as needed for lip care. 06/16/19   Jani Gravel, MD  megestrol (MEGACE) 40 MG  tablet Take 0.5 tablets (20 mg total) by mouth 2 (two) times daily. Can increase to two tablets twice a day in the event of heavy bleeding Patient not taking: Reported on 03/03/2020 10/17/19   Chancy Milroy, MD  senna-docusate (SENOKOT-S) 8.6-50 MG tablet Take 1 tablet by mouth 2 (two) times daily. Patient not taking: Reported on 03/03/2020 09/24/19   Darliss Cheney, MD  TRUEplus Lancets 28G MISC Use as directed 11/13/19   Ladell Pier, MD     Allergies:     Allergies  Allergen Reactions  . Ciprofloxacin Hcl Hives  . Dilaudid [Hydromorphone Hcl] Shortness Of Breath and Other (See Comments)    "Asystole," per pt report  . Macrobid [Nitrofurantoin Macrocrystal] Hives, Shortness Of Breath and Rash  . Other Hives, Shortness Of Breath and Rash    NO "-CILLINS"!!!  . Penicillins Shortness Of Breath    Had had cephalosporins without incident Has patient had a PCN reaction causing immediate rash, facial/tongue/throat swelling, SOB or lightheadedness with hypotension: Yes Has patient had a PCN reaction causing severe rash involving mucus membranes or skin necrosis: Unk Has patient had a PCN reaction that required hospitalization: Unk Has patient had a PCN reaction occurring within the last 10 years: No If all of the above answers are "NO", then may proceed with Cephalosporin use.   . Sulfa Antibiotics Hives, Shortness Of Breath and Rash  . Lexapro [Escitalopram Oxalate] Other (See Comments)    "I just did not like it."  Physical Exam:   Vitals  Blood pressure 119/81, pulse (!) 102, temperature 98.7 F (37.1 C), temperature source Oral, resp. rate 20, height 5' 7" (1.702 m), weight 114.3 kg, SpO2 95 %.   1. General developed female lying in bed in NAD,  2. Normal affect and insight, Not Suicidal or Homicidal, Awake Alert, Oriented X 3.  3. No F.N deficits, ALL C.Nerves Intact, Strength 5/5 all 4 extremities, Sensation intact all 4 extremities, Plantars down going.  4. Ears  and Eyes appear Normal, Conjunctivae clear, PERRLA. Moist Oral Mucosa.  5. Supple Neck, No JVD, No cervical lymphadenopathy appriciated, No Carotid Bruits.  6. Symmetrical Chest wall movement, Good air movement bilaterally, CTAB.  7. RRR, No Gallops, Rubs or Murmurs, No Parasternal Heave.  8. Positive Bowel Sounds, Abdomen Soft, she has mild right flank tenderness.  9.  No Cyanosis, Normal Skin Turgor, No Skin Rash or Bruise.  10. Good muscle tone,  joints appear normal , no effusions, Normal ROM.  11. No Palpable Lymph Nodes in Neck or Axillae    Data Review:    CBC Recent Labs  Lab 03/03/20 0356  WBC 29.8*  HGB 15.3*  HCT 46.2*  PLT 336  MCV 89.7  MCH 29.7  MCHC 33.1  RDW 14.1  LYMPHSABS 1.5  MONOABS 1.3*  EOSABS 0.0  BASOSABS 0.1   ------------------------------------------------------------------------------------------------------------------  Chemistries  Recent Labs  Lab 03/03/20 0356  NA 131*  K 4.2  CL 102  CO2 19*  GLUCOSE 446*  BUN 21*  CREATININE 2.42*  CALCIUM 9.6   ------------------------------------------------------------------------------------------------------------------ estimated creatinine clearance is 38.3 mL/min (A) (by C-G formula based on SCr of 2.42 mg/dL (H)). ------------------------------------------------------------------------------------------------------------------ No results for input(s): TSH, T4TOTAL, T3FREE, THYROIDAB in the last 72 hours.  Invalid input(s): FREET3  Coagulation profile No results for input(s): INR, PROTIME in the last 168 hours. ------------------------------------------------------------------------------------------------------------------- No results for input(s): DDIMER in the last 72 hours. -------------------------------------------------------------------------------------------------------------------  Cardiac Enzymes No results for input(s): CKMB, TROPONINI, MYOGLOBIN in the last 168  hours.  Invalid input(s): CK ------------------------------------------------------------------------------------------------------------------    Component Value Date/Time   BNP 218.9 (H) 07/01/2018 0809     ---------------------------------------------------------------------------------------------------------------  Urinalysis    Component Value Date/Time   COLORURINE YELLOW 03/03/2020 0542   APPEARANCEUR CLOUDY (A) 03/03/2020 0542   LABSPEC 1.010 03/03/2020 0542   PHURINE 5.0 03/03/2020 0542   GLUCOSEU >=500 (A) 03/03/2020 0542   HGBUR LARGE (A) 03/03/2020 0542   BILIRUBINUR NEGATIVE 03/03/2020 0542   BILIRUBINUR negative 04/19/2019 1206   BILIRUBINUR negative 09/29/2017 1317   KETONESUR NEGATIVE 03/03/2020 0542   PROTEINUR 100 (A) 03/03/2020 0542   UROBILINOGEN 0.2 04/19/2019 1206   UROBILINOGEN 0.2 06/15/2018 1530   NITRITE NEGATIVE 03/03/2020 0542   LEUKOCYTESUR LARGE (A) 03/03/2020 0542    ----------------------------------------------------------------------------------------------------------------   Imaging Results:    CT Renal Stone Study  Result Date: 03/03/2020 CLINICAL DATA:  Right flank pain, vomiting, fever. EXAM: CT ABDOMEN AND PELVIS WITHOUT CONTRAST TECHNIQUE: Multidetector CT imaging of the abdomen and pelvis was performed following the standard protocol without IV contrast. COMPARISON:  09/22/2019 CT abdomen/pelvis. FINDINGS: Lower chest: No significant pulmonary nodules or acute consolidative airspace disease. Hepatobiliary: Normal liver size. No liver mass. Normal gallbladder with no radiopaque cholelithiasis. No biliary ductal dilatation. Pancreas: Normal, with no mass or duct dilation. Spleen: Normal size. No mass. Adrenals/Urinary Tract: Normal right adrenal. Stable left adrenal 2.4 cm nodule with density 5 HU, compatible with a benign adenoma. Obstructing 4 mm right ureterovesical junction stone with  mild to moderate right hydroureteronephrosis.  Additional nonobstructing 5 mm and 3 mm lower right renal stones. Extensive right perinephric fat stranding and ill-defined fluid. Multiple nonobstructing left renal stones, largest 7 mm in the interpolar left kidney. Asymmetric moderate left renal cortical scarring. No left hydronephrosis. No contour deforming renal masses. Normal caliber left ureter. No additional ureteral stones. Nonspecific mild diffuse right renal collecting system and right ureter urothelial wall thickening. Scattered gas in the left greater the right renal collecting systems. Small amount of gas in the nondependent bladder. Otherwise normal bladder. Stomach/Bowel: Small hiatal hernia. Otherwise normal nondistended stomach. Normal caliber small bowel with no small bowel wall thickening. Normal appendix. Normal large bowel with no diverticulosis, large bowel wall thickening or pericolonic fat stranding. Vascular/Lymphatic: Atherosclerotic nonaneurysmal abdominal aorta. Left common iliac artery stent. No pathologically enlarged lymph nodes in the abdomen or pelvis. Reproductive: Grossly normal uterus.  No adnexal mass. Other: No pneumoperitoneum, ascites or focal fluid collection. Musculoskeletal: No aggressive appearing focal osseous lesions. Mild thoracolumbar spondylosis. IMPRESSION: 1. Obstructing 4 mm right UVJ stone with mild to moderate right hydroureteronephrosis. Additional nonobstructing stones in both kidneys. 2. Nonspecific scattered gas in the left greater the right renal collecting systems and in the nondependent bladder. Findings are nonspecific and could be due to recent bladder instrumentation or gas-forming infection. 3. Nonspecific diffuse mild urothelial wall thickening in the right renal collecting system and right ureter, cannot exclude superimposed infection. 4. Asymmetric moderate left renal cortical scarring. 5. Small hiatal hernia. 6. Stable left adrenal adenoma. 7. Aortic Atherosclerosis (ICD10-I70.0). Electronically  Signed   By: Ilona Sorrel M.D.   On: 03/03/2020 05:47    My personal review of EKG: Rhythm sinus tachycardia, Rate  135/min, QTc 468.   Assessment & Plan:    Active Problems:   GERD (gastroesophageal reflux disease)   Insulin-requiring or dependent type II diabetes mellitus (HCC)   Acute renal failure superimposed on stage 3 chronic kidney disease (HCC)   Essential hypertension   Sepsis secondary to UTI ([place])   Sepsis secondary to to infected right kidney stone with hydronephrosis -Sepsis present on admission, tachycardic, with leukocytosis and elevated lactic acid -Renal CTs significant for full millimeter obstructing right UVJ stone with mild to moderate right hydronephrosis. -Blood cultures were sent, now continue with IV Rocephin 2 g daily. -Urology has been consulted, for now we will keep n.p.o., continue to hold Eliquis as well(last dose was yesterday morning). -Continue with IV fluids.  Hyponatremia -Volume depletion and dehydration, can [place] to nausea and vomiting continue with IV fluids  AKI on CKD stage III -Secondary to volume dehydration and depletion, continue with IV fluids, avoid nephrotoxic medications.  History of recurrent thromboembolism causing ischemic left leg x2-required thrombectomy and angioplasty to left tibial artery August 2019: -She is on Eliquis, reports she has been compliant with it, last dose was yesterday, will continue to hold in anticipation of urological procedures, can be resumed once cleared by urology .  Hypertension -Blood pressure is acceptable, resume on Coreg, hold on amlodipine .  Substance abuse  -He endorsed smoking both cocaine and THC before 4 days, will obtain urine drug screen, she was counseled .  diabetes mellitus type 2 -Will resume on Lantus, will lower her days of 78 to 55 units, will give daily instead of bedtime as she did not receive her evening dose, as well will add sliding scale every 4 hours, will change to before  meals when she starts eating .  GERD: -Continue  PPI  Tobacco abuse  -Reports she cut from 3 to 1 pack/day, she was counseled, will start on nicotine  patch .  DVT Prophylaxis : on Eliquis  AM Labs Ordered, also please review Full Orders  Family Communication: Admission, patients condition and plan of care including tests being ordered have been discussed with the patient  who indicate understanding and agree with the plan and Code Status.  Code Status Full  Likely DC to  Home  Condition GUARDED    Consults called: Urolgoy  Admission status: Inpatient  Time spent in minutes : 60 minutes   Phillips Climes M.D on 03/03/2020 at 7:48 AM   [facility] - Office  [phone]

## 2020-03-03 NOTE — ED Notes (Signed)
Date and time results received: 03/03/20 0514 (use smartphrase ".now" to insert current time)  Test: lactic acid  Critical Value: 2.1  Name of Provider Notified: Rancour, MD   Orders Received? Or Actions Taken?: Orders Received - See Orders for details   Results received by Larene [place], [facility]

## 2020-03-03 NOTE — ED Notes (Signed)
Patient transported to PACU 3 with both belongings bags and phone.

## 2020-03-03 NOTE — Discharge Instructions (Signed)
DISCHARGE INSTRUCTIONS FOR KIDNEY STONE/URETERAL STENT   MEDICATIONS:  1.  Resume all your other meds from home. 2.  Take the antibiotics as prescribed. 3.  When you get your date for surgery to remove the stones, you will need to stop the Coumadin 5 days prior to that day.  ACTIVITY:  1. No strenuous activity x 1week  2. No driving while on narcotic pain medications  3. Drink plenty of water  4. Continue to walk at home - you can still get blood clots when you are at home, so keep active, but don't over do it.  5. May return to work/school tomorrow or when you feel ready   BATHING:  1. You can shower and we recommend daily showers    SIGNS/SYMPTOMS TO CALL:  Please call us if you have a fever greater than 101.5, uncontrolled nausea/vomiting, uncontrolled pain, dizziness, unable to urinate, bloody urine, chest pain, shortness of breath, leg swelling, leg pain, redness around wound, drainage from wound, or any other concerns or questions.   You can reach [name] at [phone].   FOLLOW-UP:  1. You be contacted with a follow-up surgery date to remove the stones in your right ureter and kidney.

## 2020-03-03 NOTE — Consult Note (Signed)
I have been asked to see the patient by Dr. Phillips Climes, for evaluation and management of urosepsis.  History of present illness: 44 year old female who presented to the emergency department with acute right-sided flank and abdominal pain.  She also had associated nausea and vomiting.  She had weakness and general malaise.  She has a history of kidney stones, 9 months ago she was admitted for septic stone event as well.  She was noted to have a low-grade fever in the emergency department.  She also had have a white blood cell count of 30,000, and her heart rate was in the 110s.  Her urine analysis was concerning for infection.  A CT scan was performed which demonstrated a 4 mm stone in the right distal ureter with right perinephric stranding and moderate to severe hydronephrosis.   Review of systems: A 12 point comprehensive review of systems was obtained and is negative unless otherwise stated in the history of present illness.  Patient Active Problem List   Diagnosis Date Noted  . Sepsis ([place]) 03/03/2020  . Homeless 11/13/2019  . Chest pain at rest 09/24/2019  . Gastritis 09/24/2019  . Sinus tachycardia 09/24/2019  . Sepsis secondary to UTI ([place]) 09/23/2019  . Polysubstance abuse ([place]) 09/23/2019  . Atypical chest pain   . Stage 3b chronic kidney disease 07/30/2019  . Iron deficiency anemia due to chronic blood loss 06/12/2019  . Vitamin B12 deficiency 06/12/2019  . Folate deficiency 06/12/2019  . Constipation 06/12/2019  . Prolonged QT interval   . Ureteral stone with hydronephrosis 06/11/2019  . Acute renal failure superimposed on stage 3 chronic kidney disease ([place]) 06/26/2018  . Right ureteral stone 06/26/2018  . Essential hypertension 06/26/2018  . Subtherapeutic international normalized ratio (INR) 06/26/2018  . Adrenal nodule ([place]) 06/21/2018  . Lung nodules 06/21/2018  . Abnormal uterine bleeding (AUB) 05/14/2018  . Hemorrhagic cyst of right ovary 05/14/2018  .  Ischemia of extremity 05/12/2018  . Former smoker 03/14/2018  . 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 Thromboembolism ([place]) 02/25/2018  . Microalbuminuria 09/29/2017  . Insomnia 09/29/2017  . Immunization due 08/01/2017  . Anxiety and depression 04/28/2017  . 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 Tobacco abuse 02/25/2017  . Insulin-requiring or dependent type II diabetes mellitus ([place]) 02/25/2017  . Peripheral vascular disease of lower extremity ([place]) 02/24/2017  . GERD (gastroesophageal reflux disease)   . Hyperparathyroidism     No current facility-administered medications on file prior to encounter.   Current Outpatient Medications on File Prior to Encounter  Medication Sig Dispense Refill  . acetaminophen (TYLENOL) 500 MG tablet Take 2,000 mg by mouth daily as needed (for headaches).     Marland Kitchen albuterol (PROAIR HFA) 108 (90 Base) MCG/ACT inhaler Inhale 2 puffs into the lungs every 6 (six) hours as needed for wheezing or shortness of breath. 18 g 2  . amLODipine (NORVASC) 5 MG tablet Take 1 tablet (5 mg total) by mouth daily. 30 tablet 11  . apixaban (ELIQUIS) 5 MG TABS tablet Take 1 tablet (5 mg total) by mouth 2 (two) times daily. 60 tablet 2  . aspirin EC 81 MG tablet Take 1 tablet (81 mg total) by mouth daily. 30 tablet 11  . atorvastatin (LIPITOR) 10 MG tablet Take 1 tablet (10 mg total) by mouth daily. 30 tablet 3  . buPROPion (WELLBUTRIN SR) 150 MG 12 hr tablet Take 1 tablet (150 mg total) by  mouth 2 (two) times daily. 60 tablet 11  . carvedilol (COREG) 3.125 MG tablet Take 1 tablet (3.125 mg total) by mouth 2 (two) times daily. 60 tablet 6  . ferrous sulfate 325 (65 FE) MG tablet Take 1 tablet (325 mg total) by mouth daily with breakfast. 30 tablet 3  . folic acid (FOLVITE) 1 MG tablet Take 1 tablet (1 mg total) by mouth daily. 30 tablet 3  . gabapentin (NEURONTIN) 400 MG capsule Take 1 capsule (400 mg  total) by mouth 3 (three) times daily. 90 capsule 3  . hydrOXYzine (ATARAX/VISTARIL) 50 MG tablet Take 1 tablet (50 mg total) by mouth 3 (three) times daily as needed for itching or anxiety. 90 tablet 4  . ibuprofen (ADVIL) 800 MG tablet Take 1 tablet (800 mg total) by mouth every 8 (eight) hours as needed. 60 tablet 1  . insulin aspart (NOVOLOG) 100 UNIT/ML injection Inject 5-15 Units into the skin 3 (three) times daily before meals. 10 mL 3  . insulin glargine (LANTUS) 100 UNIT/ML injection Inject 0.78 mLs (78 Units total) into the skin at bedtime. 10 mL 2  . loratadine (CLARITIN) 10 MG tablet Take 1 tablet (10 mg total) by mouth daily as needed for allergies. 30 tablet 2  . megestrol (MEGACE) 40 MG tablet Take 1 tablet (40 mg total) by mouth 2 (two) times daily. Can increase to two tablets twice a day in the event of heavy bleeding 60 tablet 5  . nitroGLYCERIN (NITROSTAT) 0.4 MG SL tablet Place 1 tablet (0.4 mg total) under the tongue every 5 (five) minutes as needed for chest pain. 25 tablet 3  . omeprazole (PRILOSEC) 20 MG capsule Take 1 capsule (20 mg total) by mouth 2 (two) times daily before a meal. 60 capsule 3  . sertraline (ZOLOFT) 50 MG tablet Take 1 tablet (50 mg total) by mouth daily. 1/2 tab daily x 2 wks then 1 tab PO daily (Patient taking differently: Take 50 mg by mouth daily. ) 30 tablet 4  . sucralfate (CARAFATE) 1 GM/10ML suspension Take 10 mLs (1 g total) by mouth 2 (two) times daily with breakfast and lunch. 200 mL 1  . vitamin B-12 (CYANOCOBALAMIN) 1000 MCG tablet Take 2 tablets (2,000 mcg total) by mouth daily. 30 tablet 3  . blood glucose meter kit and supplies Dispense based on patient and insurance preference. Use up to four times daily as directed. (FOR ICD-9 250.00, 250.01). 1 each 0  . Blood Glucose Monitoring Suppl (TRUE METRIX METER) w/Device KIT Use as directed 1 kit 0  . glucose blood (TRUE METRIX BLOOD GLUCOSE TEST) test strip Use as instructed 100 each 12  .  Insulin Pen Needle 31G X 5 MM MISC Use with Lantus and Novolog injections. 100 each 11  . Insulin Syringe-Needle U-100 (BD INSULIN SYRINGE ULTRAFINE) 31G X 5/16" 0.5 ML MISC Use as directed 100 each 3  . Insulin Syringes, Disposable, U-100 1 ML MISC Use as directed 100 each 0  . lip balm (CARMEX) ointment Apply topically as needed for lip care. 7 g 0  . megestrol (MEGACE) 40 MG tablet Take 0.5 tablets (20 mg total) by mouth 2 (two) times daily. Can increase to two tablets twice a day in the event of heavy bleeding (Patient not taking: Reported on 03/03/2020) 60 tablet 5  . senna-docusate (SENOKOT-S) 8.6-50 MG tablet Take 1 tablet by mouth 2 (two) times daily. (Patient not taking: Reported on 03/03/2020) 60 tablet 2  . TRUEplus Lancets 28G  MISC Use as directed 100 each 4    Past Medical History:  Diagnosis Date  . Anxiety   . Depression   . Diabetes ([place])    Type 2  . GERD (gastroesophageal reflux disease)   . History of blood clots    ,DVT-left leg, and early 2000, had blood clot behind left breast  . History of kidney stones   . Hypercalcemia   . Hyperparathyroidism   . Hypertension    had been on Lisinopril and PCP took her off it 4 months ago  . Iron deficiency anemia   . Left thyroid nodule   . Mass of right ovary   . Nephrolithiasis   . PAOD (peripheral arterial occlusive disease) ([place])   . Peripheral vascular disease ([place])   . Prolonged Q-T interval on ECG 04/19/2019  . Renal calculi   . Substance abuse ([place])   . Tooth loose    top tooth from the right  is loose     Past Surgical History:  Procedure Laterality Date  . ABDOMINAL AORTOGRAM W/LOWER EXTREMITY N/A 11/14/2017   Procedure: ABDOMINAL AORTOGRAM W/LOWER EXTREMITY;  Surgeon: Waynetta Sandy, MD;  Location: [facility];  Service: Cardiovascular;  Laterality: N/A;  . ANGIOPLASTY ILLIAC ARTERY Left 02/25/2018   Procedure: BALLOON ANGIOPLASTY LEFT ILIAC ARTERY;  Surgeon: Conrad Adel, MD;  Location: [facility];  Service: Vascular;  Laterality: Left;  . APPLICATION OF WOUND VAC Left 03/03/2018   Procedure: APPLICATION OF WOUND VAC;  Surgeon: Angelia Mould, MD;  Location: [facility];  Service: Vascular;  Laterality: Left;  . CYSTOSCOPY W/ URETERAL STENT PLACEMENT Right 06/27/2018   Procedure: CYSTOSCOPY WITH RETROGRADE PYELOGRAM/URETERAL DOUBLE J STENT PLACEMENT;  Surgeon: Ceasar Mons, MD;  Location: [facility];  Service: Urology;  Laterality: Right;  . CYSTOSCOPY WITH STENT PLACEMENT N/A 02/23/2019   Procedure: CYSTOSCOPY WITH RIGHT URETERAL STENT EXCHANGE/ LEFT URETERAL STENT PLACEMENT;  Surgeon: Ceasar Mons, MD;  Location: [facility];  Service: Urology;  Laterality: N/A;  . CYSTOSCOPY WITH STENT PLACEMENT Right 06/11/2019   Procedure: CYSTOSCOPY,RIGHT RETROGRADE WITH STENT PLACEMENT;  Surgeon: Irine Seal, MD;  Location: [facility];  Service: Urology;  Laterality: Right;  . CYSTOSCOPY/RETROGRADE/URETEROSCOPY/STONE EXTRACTION WITH BASKET Bilateral 05/01/2019   Procedure: CYSTOSCOPY/URETEROSCOPY/STONE EXTRACTION / LASER LITHOTRIPSY, BILATERAL URETEROSCOPY WITH URETERAL STONE EXTRACTION AND STENT EXCHANGE;  Surgeon: Ceasar Mons, MD;  Location: [facility];  Service: Urology;  Laterality: Bilateral;  . CYSTOSCOPY/URETEROSCOPY/HOLMIUM LASER/STENT PLACEMENT Right 06/27/2019   Procedure: CYSTOSCOPY/URETEROSCOPY/HOLMIUM LASER/STENT EXCHANGE;  Surgeon: Ceasar Mons, MD;  Location: [facility];  Service: Urology;  Laterality: Right;  . EMBOLECTOMY Left 02/24/2017   Procedure: Left Lower Extremity Embolectomy and Angiogram.;  Surgeon: Waynetta Sandy, MD;  Location: [facility];  Service: Vascular;  Laterality: Left;  . INSERTION OF ILIAC STENT  02/24/2017   Procedure: INSERTION OF Common ILIAC STENT;  Surgeon: Waynetta Sandy, MD;  Location: [facility];  Service: Vascular;;  . INTRAOPERATIVE ARTERIOGRAM Left 02/25/2018   Procedure: INTRA OPERATIVE  ARTERIOGRAM WITH LEFT LEG RUNOFF;  Surgeon: Conrad Limestone, MD;  Location: [place] [facility];  Service: Vascular;  Laterality: Left;  . LOWER EXTREMITY ANGIOGRAM Left 02/24/2017   Procedure: Aortagram, Left lower extremity Run-off;  Surgeon: Waynetta Sandy, MD;  Location: [facility];  Service: Vascular;  Laterality: Left;  . PATCH ANGIOPLASTY Left 02/25/2018   Procedure: PATCH ANGIOPLASTY LEFT SUPERFICIAL FEMORAL ARTERY WITH BOVINE PATCH;  Surgeon: Conrad [place], MD;  Location: [facility];  Service: Vascular;  Laterality: Left;  . PERCUTANEOUS VENOUS THROMBECTOMY,LYSIS WITH INTRAVASCULAR ULTRASOUND (IVUS) Left 05/12/2018   Procedure: MECHANICAL THROMBECTOMY LEFT LEG, BALLOON ANGIOPLASTY LEFT ANTERIOR TIBIAL ARTERY, AORTOGRAM WITH LEFT LEG RUNOFF;  Surgeon: Serafina Mitchell, MD;  Location: [facility];  Service: Vascular;  Laterality: Left;  . 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 THROMBECTOMY FEMORAL ARTERY Left 02/25/2018   Procedure: LEFT ILIAC AND POPLITEAL ARTERY THROMBECTOMY;  Surgeon: Conrad Solana, MD;  Location: [facility];  Service: Vascular;  Laterality: Left;  . TUBAL LIGATION    . WOUND DEBRIDEMENT Left 03/03/2018   Procedure: DEBRIDEMENT WOUND;  Surgeon: Angelia Mould, MD;  Location: [facility];  Service: Vascular;  Laterality: Left;  . WOUND EXPLORATION Left 03/03/2018   Procedure: WOUND EXPLORATION;  Surgeon: Angelia Mould, MD;  Location: [facility];  Service: Vascular;  Laterality: Left;    Social History   Tobacco Use  . Smoking status: Current Some Day Smoker    Packs/day: 0.30    Years: 15.00    Pack years: 4.50    Types: Cigarettes  . Smokeless tobacco: Never Used  Substance Use Topics  . Alcohol use: Yes    Comment: occassionally  . Drug use: Yes    Types: Marijuana, Cocaine    Comment: once daily- last time used was night of 02/21/2019    Family History  Problem Relation Age of Onset  . Depression Mother   . Diabetes Father   . Heart failure Father     PE: Vitals:    03/03/20 0600 03/03/20 0700 03/03/20 0800 03/03/20 0900  BP: 113/81 119/81 122/84 134/89  Pulse: (!) 112 (!) 102 100 (!) 110  Resp: _0 (!) 34  Temp: 98.7 F (37.1 C)     TempSrc: Oral     SpO2: 97% 95% 98% 98%  Weight:      Height:       Patient appears to be in moderate distress  patient is alert and oriented x3 Atraumatic normocephalic head No cervical or supraclavicular lymphadenopathy appreciated No increased work of breathing, no audible wheezes/rhonchi Regular sinus rhythm/rate Abdomen is soft, nontender, nondistended, right CVA tenderness Lower extremities are symmetric without appreciable edema Grossly neurologically intact No identifiable skin lesions  Recent Labs    03/03/20 0356  WBC 29.8*  HGB 15.3*  HCT 46.2*   Recent Labs    03/03/20 0356  NA 131*  K 4.2  CL 102  CO2 19*  GLUCOSE 446*  BUN 21*  CREATININE 2.42*  CALCIUM 9.6   No results for input(s): LABPT, INR in the last 72 hours. No results for input(s): LABURIN in the last 72 hours. Results for orders placed or performed during the hospital encounter of 03/03/20  SARS Coronavirus 2 by RT PCR (hospital order, performed in [facility] hospital lab) Nasopharyngeal Nasopharyngeal Swab     Status: None   Collection Time: 03/03/20  6:03 AM   Specimen: Nasopharyngeal Swab  Result Value Ref Range Status   SARS Coronavirus 2 NEGATIVE NEGATIVE Final    Comment: (NOTE) SARS-CoV-2 target nucleic acids are NOT DETECTED. The SARS-CoV-2 RNA is generally detectable in upper and lower respiratory specimens during the acute phase of infection. The lowest concentration of SARS-CoV-2 viral copies this assay can detect is 250 copies / mL. A negative result does not preclude SARS-CoV-2 infection and should not be used as the sole basis for treatment or other patient management decisions.  A negative result  may occur with improper specimen collection / handling, submission of specimen other than  nasopharyngeal swab, presence of viral mutation(s) within the areas targeted by this assay, and inadequate number of viral copies (<250 copies / mL). A negative result must be combined with clinical observations, patient history, and epidemiological information. Fact Sheet for Patients:   StrictlyIdeas.no Fact Sheet for Healthcare Providers: BankingDealers.co.za This test is not yet approved or cleared  by the [place] FDA and has been authorized for detection and/or diagnosis of SARS-CoV-2 by FDA under an Emergency Use Authorization (EUA).  This EUA will remain in effect (meaning this test can be used) for the duration of the COVID-19 declaration under Section 564(b)(1) of the Act, 21 U.S.C. section 360bbb-3(b)(1), unless the authorization is terminated or revoked sooner. Performed at [facility], [place] [address], [place], [place] [address]     Imaging: I have independently reviewed the patient's CT scan performed in the emergency department, abdomen and pelvis noncontrast.  This demonstrates a small nonobstructing stone in the right kidney as well as severe hydroureteronephrosis and stranding around the right kidney with an obstructing stone in the right distal ureter.  She also has some gas within the bladder and left renal pelvis.  Imp: The patient has an obstructing right infected ureteral stone with septoid physiology.  Recommendations: Plan is to place a stent urgently in the patient's right ureter to decompress and drain the right kidney.  She will then be placed on culture specific antibiotics and scheduled for removal of her stones in 2 weeks with Dr. Lovena Neighbours.    Ardis Hughs

## 2020-03-04 ENCOUNTER — Other Ambulatory Visit: Payer: Self-pay | Admitting: Internal Medicine

## 2020-03-04 DIAGNOSIS — N200 Calculus of kidney: Secondary | ICD-10-CM

## 2020-03-04 LAB — BLOOD CULTURE ID PANEL (REFLEXED)

## 2020-03-04 LAB — CBC
HCT: 39.6 % (ref 36.0–46.0)
Hemoglobin: 12.5 g/dL (ref 12.0–15.0)
MCH: 29.5 pg (ref 26.0–34.0)
MCHC: 31.6 g/dL (ref 30.0–36.0)
MCV: 93.4 fL (ref 80.0–100.0)
Platelets: 268 10*3/uL (ref 150–400)
RBC: 4.24 MIL/uL (ref 3.87–5.11)
RDW: 14.6 % (ref 11.5–15.5)
WBC: 19.4 10*3/uL — ABNORMAL HIGH (ref 4.0–10.5)
nRBC: 0 % (ref 0.0–0.2)

## 2020-03-04 LAB — GLUCOSE, CAPILLARY
Glucose-Capillary: 152 mg/dL — ABNORMAL HIGH (ref 70–99)
Glucose-Capillary: 206 mg/dL — ABNORMAL HIGH (ref 70–99)

## 2020-03-04 LAB — BASIC METABOLIC PANEL
Anion gap: 8 (ref 5–15)
BUN: 24 mg/dL — ABNORMAL HIGH (ref 6–20)
CO2: 21 mmol/L — ABNORMAL LOW (ref 22–32)
Calcium: 9.1 mg/dL (ref 8.9–10.3)
Chloride: 109 mmol/L (ref 98–111)
Creatinine, Ser: 2.29 mg/dL — ABNORMAL HIGH (ref 0.44–1.00)
GFR calc Af Amer: 29 mL/min — ABNORMAL LOW (ref >60)
GFR calc non Af Amer: 25 mL/min — ABNORMAL LOW (ref >60)
Glucose, Bld: 172 mg/dL — ABNORMAL HIGH (ref 70–99)
Potassium: 5.3 mmol/L — ABNORMAL HIGH (ref 3.5–5.1)
Sodium: 138 mmol/L (ref 135–145)

## 2020-03-04 LAB — POTASSIUM: Potassium: 4.3 mmol/L (ref 3.5–5.1)

## 2020-03-04 MED ORDER — OXYCODONE HCL 5 MG PO TABS: 5.0000 mg | ORAL_TABLET | Freq: Three times a day (TID) | ORAL | 0 refills | Status: AC | PRN

## 2020-03-04 MED ORDER — CEPHALEXIN 500 MG PO CAPS: 500.0000 mg | ORAL_CAPSULE | Freq: Four times a day (QID) | ORAL | 0 refills | Status: AC

## 2020-03-04 MED ORDER — FERROUS SULFATE 325 (65 FE) MG PO TABS: 325.0000 mg | ORAL_TABLET | Freq: Every day | ORAL | 3 refills | Status: DC

## 2020-03-04 MED ORDER — INSULIN GLARGINE 100 UNIT/ML ~~LOC~~ SOLN: 62.0000 [IU] | Freq: Every day | SUBCUTANEOUS | 2 refills | Status: DC

## 2020-03-04 MED FILL — FERROUS SULFATE 325 MG TAB: 325 (65 FE) | 30 days supply | Qty: 30 | Fill #0

## 2020-03-04 MED FILL — CEPHALEXIN 500 MG CAPSULE: 500 | 12 days supply | Qty: 48 | Fill #0

## 2020-03-04 MED FILL — ?CARVEDILOL 3.125 MG TABLET: 3.125 | 30 days supply | Qty: 60 | Fill #0

## 2020-03-04 MED FILL — !LANTUS 100 UNITS/ML VIAL: 100 | 16 days supply | Qty: 10 | Fill #0

## 2020-03-04 MED FILL — ?ATORVASTATIN 10 MG TABLET: 10 | 30 days supply | Qty: 30 | Fill #0

## 2020-03-04 NOTE — Progress Notes (Signed)
PHARMACY - PHYSICIAN COMMUNICATION CRITICAL VALUE ALERT - BLOOD CULTURE IDENTIFICATION (BCID)  Terri Sparks is an 44 y.o. female who presented to [facility] on 03/03/2020 with a chief complaint of flank pain  Assessment:  Patient with BCID noted below and already on antibiotics (include suspected source if known)  Name of physician (or Provider) Contacted: M. Sharlet Salina  Current antibiotics: Ceftriaxone 2gm iv q24hr  Changes to prescribed antibiotics recommended:  Patient is on recommended antibiotics - No changes needed  Results for orders placed or performed during the hospital encounter of 06/26/18  Blood Culture ID Panel (Reflexed) (Collected: 06/26/2018  4:21 PM)  Result Value Ref Range   Enterococcus species NOT DETECTED NOT DETECTED   Listeria monocytogenes NOT DETECTED NOT DETECTED   Staphylococcus species NOT DETECTED NOT DETECTED   Staphylococcus aureus (BCID) NOT DETECTED NOT DETECTED   Streptococcus species NOT DETECTED NOT DETECTED   Streptococcus agalactiae NOT DETECTED NOT DETECTED   Streptococcus pneumoniae NOT DETECTED NOT DETECTED   Streptococcus pyogenes NOT DETECTED NOT DETECTED   Acinetobacter baumannii NOT DETECTED NOT DETECTED   Enterobacteriaceae species DETECTED (A) NOT DETECTED   Enterobacter cloacae complex NOT DETECTED NOT DETECTED   Escherichia coli DETECTED (A) NOT DETECTED   Klebsiella oxytoca NOT DETECTED NOT DETECTED   Klebsiella pneumoniae NOT DETECTED NOT DETECTED   Proteus species NOT DETECTED NOT DETECTED   Serratia marcescens NOT DETECTED NOT DETECTED   Carbapenem resistance NOT DETECTED NOT DETECTED   Haemophilus influenzae NOT DETECTED NOT DETECTED   Neisseria meningitidis NOT DETECTED NOT DETECTED   Pseudomonas aeruginosa NOT DETECTED NOT DETECTED   Candida albicans NOT DETECTED NOT DETECTED   Candida glabrata NOT DETECTED NOT DETECTED   Candida krusei NOT DETECTED NOT DETECTED   Candida parapsilosis NOT DETECTED NOT DETECTED   Candida  tropicalis NOT DETECTED NOT DETECTED    Nani Skillern Crowford 03/04/2020  12:25 AM

## 2020-03-04 NOTE — Discharge Summary (Signed)
Physician Discharge Summary  Terri Sparks Terri Sparks:[id number] DOB: 02/09/1976 DOA: 03/03/2020  PCP: Terri Pier, MD  Admit date: 03/03/2020 Discharge date: 03/04/2020  Time spent: 35 minutes  Recommendations for Outpatient Follow-up:  1. Complete 12 more days of Keflex per urologist Dr. Louis Sparks and okay to resume anticoagulation 2. Needs outpatient coordination of stone removal by Dr. Louis Sparks who is aware of patient's discharge and will contact her 3. Needs outpatient CBC and Chem-12 in about 1 week at PCP office 4. 3-day supplies of oxycodone given to patient-she understands to obtain refills from her primary care physician 5. No dosage changes of insulin etc. as below  Discharge Diagnoses:  Active Problems:   GERD (gastroesophageal reflux disease)   Insulin-requiring or dependent type II diabetes mellitus (Terri Sparks)   Acute renal failure superimposed on stage 3 chronic kidney disease (HCC)   Essential hypertension   Sepsis secondary to UTI (Terri Sparks)   Sepsis Terri Sparks)   Discharge Condition: Improved  Diet recommendation: Heart healthy  Filed Weights   03/03/20 0355  Weight: 114.3 kg    Brief Narrative:  44 year old white female Bipolar DM TY 2 insulin-dependent Prior polysubstance use Recurrent DVT on Eliquis Prolonged QT Peripheral vascular disease/PAD status post percutaneous thrombectomies August 2019   Last admitted 12/19 through 12/21 with sepsis likely secondary to urinary infection leukocytosis 22,000-sent home on Eliquis omeprazole and Carafate at the time and antibiotics to treat her urinary infection She has had previous E. coli bacteremia back in 2019 in addition to a right urinary stone with right JJ stent placed 06/27/2018     Assessment & Plan:     1. Sepsis secondary to urinary infection with R kidney stone and hydronephrosis 1. Discussed with Dr. Louis Sparks of urology who feels patient is stable for discharge on a total of 14 days of antibiotics 2. He will  coordinate outpatient follow-up with regards to stone extraction in the outpatient setting given she is on anticoagulation long-term and indefinitely 2. Hyponatremia/mild hyperkalemia 1. Sample appeared to be hemolyzed-repeat is 4.3 and hyponatremia has resolved 2. Outpatient labs in the outpatient setting 1 week 3. Diabetes mellitus type 2 1. Continue meds from home 2. Cut back Lantus on discharge to 62 units  4. AKI superimposed on CKD 3 1. Patient is creatinine trending back closer to normal she has some mild azotemia but will need labs in about a week 2. She received fluids until time of discharge and is passing good urine 5. Substance abuse-cocaine/THC 1. Patient understands clearly she will not be given longer than 3-day supply of pain meds 2. She will need follow-up in the outpatient setting 6. Thrombectomies angioplasty 2/2 recurrent thromboembolism 05/2018 on Eliquis 1. Needs outpatient vascular follow-up 7. Dysfunctional uterine bleeding for hysterectomy?-Preop clearance by Dr. Alveda Sparks in the outpatient setting 1. Will need outpatient follow-up with both GYN and cardiology 8. Hypercoagulable state?  Follows with Dr. Irene Sparks oncology 1. Will be CCed on note-she was recently seen by him earlier this month    Procedures:  Cystoscopy + right ureteric stent placement 5/31 urologist Consultations:  Urologist Dr. Louis Sparks  Discharge Exam: Vitals:   03/04/20 0738 03/04/20 1247  BP: 129/67 133/78  Pulse: 91 91  Resp: 19 19  Temp: 98.2 F (36.8 C) 100.3 F (37.9 C)  SpO2: 100% 100%    General: Awake alert coherent no distress Cardiovascular: S1-S2 no murmur Respiratory: Chest clear Abdomen soft no rebound Extremity edema  Discharge Instructions   Discharge Instructions    Diet -  low sodium heart healthy   Complete by: As directed    Discharge instructions   Complete by: As directed    Complete the full course of Keflex for treatment of your E. coli infection You  can resume your blood thinner and I would continue it lifelong Please follow-up with Dr. Margaretann Sparks of cardiology as well as your OB/GYN for consideration of your hysterectomy--remember that you are not supposed to take your Megace as this can precipitate clots I would stop taking the aspirin_0 Your doses of insulin long-acting were 78 units we have cut that back to 62 You have been given a paper prescription for 3-day supply Roxicodone and you will need to follow-up with your primary physician for refills Dr. Louis Sparks will call you with regards to planning for stone removal and how to transition off of Xarelto and how long to be off of it once you stabilize   Increase activity slowly   Complete by: As directed      Allergies as of 03/04/2020      Reactions   Ciprofloxacin Hcl Hives   Dilaudid [hydromorphone Hcl] Shortness Of Breath, Other (See Comments)   "Asystole," per pt report   Macrobid [nitrofurantoin Macrocrystal] Hives, Shortness Of Breath, Rash   Other Hives, Shortness Of Breath, Rash   NO "-CILLINS"!!!   Penicillins Shortness Of Breath   Had had cephalosporins without incident Has patient had a PCN reaction causing immediate rash, facial/tongue/throat swelling, SOB or lightheadedness with hypotension: Yes Has patient had a PCN reaction causing severe rash involving mucus membranes or skin necrosis: Unk Has patient had a PCN reaction that required hospitalization: Unk Has patient had a PCN reaction occurring within the last 10 years: No If all of the above answers are "NO", then may proceed with Cephalosporin use.   Sulfa Antibiotics Hives, Shortness Of Breath, Rash   Lexapro [escitalopram Oxalate] Other (See Comments)   "I just did not like it."      Medication List    STOP taking these medications   aspirin EC 81 MG tablet   blood glucose meter kit and supplies   True Metrix Blood Glucose Test test strip Generic drug: glucose blood   True Metrix Meter w/Device Kit      TAKE these medications   acetaminophen 500 MG tablet Commonly known as: TYLENOL Take 2,000 mg by mouth daily as needed (for headaches).   albuterol 108 (90 Base) MCG/ACT inhaler Commonly known as: ProAir HFA Inhale 2 puffs into the lungs every 6 (six) hours as needed for wheezing or shortness of breath.   amLODipine 5 MG tablet Commonly known as: NORVASC Take 1 tablet (5 mg total) by mouth daily.   apixaban 5 MG Tabs tablet Commonly known as: Eliquis Take 1 tablet (5 mg total) by mouth 2 (two) times daily.   atorvastatin 10 MG tablet Commonly known as: LIPITOR Take 1 tablet (10 mg total) by mouth daily.   buPROPion 150 MG 12 hr tablet Commonly known as: WELLBUTRIN SR Take 1 tablet (150 mg total) by mouth 2 (two) times daily.   carvedilol 3.125 MG tablet Commonly known as: COREG Take 1 tablet (3.125 mg total) by mouth 2 (two) times daily.   cephALEXin 500 MG capsule Commonly known as: KEFLEX Take 1 capsule (500 mg total) by mouth 4 (four) times daily for 12 days.   ferrous sulfate 325 (65 FE) MG tablet Take 1 tablet (325 mg total) by mouth daily with breakfast.   folic acid 1 MG  tablet Commonly known as: FOLVITE Take 1 tablet (1 mg total) by mouth daily.   gabapentin 400 MG capsule Commonly known as: NEURONTIN Take 1 capsule (400 mg total) by mouth 3 (three) times daily.   hydrOXYzine 50 MG tablet Commonly known as: ATARAX/VISTARIL Take 1 tablet (50 mg total) by mouth 3 (three) times daily as needed for itching or anxiety.   ibuprofen 800 MG tablet Commonly known as: ADVIL Take 1 tablet (800 mg total) by mouth every 8 (eight) hours as needed.   insulin aspart 100 UNIT/ML injection Commonly known as: novoLOG Inject 5-15 Units into the skin 3 (three) times daily before meals.   insulin glargine 100 UNIT/ML injection Commonly known as: LANTUS Inject 0.62 mLs (62 Units total) into the skin at bedtime. What changed: how much to take   Insulin Pen Needle 31G X  5 MM Misc Use with Lantus and Novolog injections.   Insulin Syringe-Needle U-100 31G X 5/16" 0.5 ML Misc Commonly known as: BD Insulin Syringe Ultrafine Use as directed   Insulin Syringes (Disposable) U-100 1 ML Misc Use as directed   lip balm ointment Apply topically as needed for lip care.   loratadine 10 MG tablet Commonly known as: CLARITIN Take 1 tablet (10 mg total) by mouth daily as needed for allergies.   megestrol 40 MG tablet Commonly known as: MEGACE Take 1 tablet (40 mg total) by mouth 2 (two) times daily. Can increase to two tablets twice a day in the event of heavy bleeding What changed: Another medication with the same name was removed. Continue taking this medication, and follow the directions you see here.   nitroGLYCERIN 0.4 MG SL tablet Commonly known as: NITROSTAT Place 1 tablet (0.4 mg total) under the tongue every 5 (five) minutes as needed for chest pain.   omeprazole 20 MG capsule Commonly known as: PRILOSEC Take 1 capsule (20 mg total) by mouth 2 (two) times daily before a meal.   oxyCODONE 5 MG immediate release tablet Commonly known as: Roxicodone Take 1 tablet (5 mg total) by mouth every 8 (eight) hours as needed for up to 3 days.   senna-docusate 8.6-50 MG tablet Commonly known as: Senokot-S Take 1 tablet by mouth 2 (two) times daily.   sertraline 50 MG tablet Commonly known as: Zoloft Take 1 tablet (50 mg total) by mouth daily. 1/2 tab daily x 2 wks then 1 tab PO daily What changed: additional instructions   sucralfate 1 GM/10ML suspension Commonly known as: CARAFATE Take 10 mLs (1 g total) by mouth 2 (two) times daily with breakfast and lunch.   TRUEplus Lancets 28G Misc Use as directed   vitamin B-12 1000 MCG tablet Commonly known as: CYANOCOBALAMIN Take 2 tablets (2,000 mcg total) by mouth daily.      Allergies  Allergen Reactions  . Ciprofloxacin Hcl Hives  . Dilaudid [Hydromorphone Hcl] Shortness Of Breath and Other (See  Comments)    "Asystole," per pt report  . Macrobid [Nitrofurantoin Macrocrystal] Hives, Shortness Of Breath and Rash  . Other Hives, Shortness Of Breath and Rash    NO "-CILLINS"!!!  . Penicillins Shortness Of Breath    Had had cephalosporins without incident Has patient had a PCN reaction causing immediate rash, facial/tongue/throat swelling, SOB or lightheadedness with hypotension: Yes Has patient had a PCN reaction causing severe rash involving mucus membranes or skin necrosis: Unk Has patient had a PCN reaction that required hospitalization: Unk Has patient had a PCN reaction occurring within the last  10 years: No If all of the above answers are "NO", then may proceed with Cephalosporin use.   . Sulfa Antibiotics Hives, Shortness Of Breath and Rash  . Lexapro [Escitalopram Oxalate] Other (See Comments)    "I just did not like it."   Follow-up Information    [facility] In 2 weeks.   Contact information: [place] [phone]           The results of significant diagnostics from this hospitalization (including imaging, microbiology, ancillary and laboratory) are listed below for reference.    Significant Diagnostic Studies: DG C-Arm 1-60 Min-No Report  Result Date: 03/03/2020 Fluoroscopy was utilized by the requesting physician.  No radiographic interpretation.   CT Renal Stone Study  Result Date: 03/03/2020 CLINICAL DATA:  Right flank pain, vomiting, fever. EXAM: CT ABDOMEN AND PELVIS WITHOUT CONTRAST TECHNIQUE: Multidetector CT imaging of the abdomen and pelvis was performed following the standard protocol without IV contrast. COMPARISON:  09/22/2019 CT abdomen/pelvis. FINDINGS: Lower chest: No significant pulmonary nodules or acute consolidative airspace disease. Hepatobiliary: Normal liver size. No liver mass. Normal gallbladder with no radiopaque cholelithiasis. No biliary ductal dilatation. Pancreas: Normal,  with no mass or duct dilation. Spleen: Normal size. No mass. Adrenals/Urinary Tract: Normal right adrenal. Stable left adrenal 2.4 cm nodule with density 5 HU, compatible with a benign adenoma. Obstructing 4 mm right ureterovesical junction stone with mild to moderate right hydroureteronephrosis. Additional nonobstructing 5 mm and 3 mm lower right renal stones. Extensive right perinephric fat stranding and ill-defined fluid. Multiple nonobstructing left renal stones, largest 7 mm in the interpolar left kidney. Asymmetric moderate left renal cortical scarring. No left hydronephrosis. No contour deforming renal masses. Normal caliber left ureter. No additional ureteral stones. Nonspecific mild diffuse right renal collecting system and right ureter urothelial wall thickening. Scattered gas in the left greater the right renal collecting systems. Small amount of gas in the nondependent bladder. Otherwise normal bladder. Stomach/Bowel: Small hiatal hernia. Otherwise normal nondistended stomach. Normal caliber small bowel with no small bowel wall thickening. Normal appendix. Normal large bowel with no diverticulosis, large bowel wall thickening or pericolonic fat stranding. Vascular/Lymphatic: Atherosclerotic nonaneurysmal abdominal aorta. Left common iliac artery stent. No pathologically enlarged lymph nodes in the abdomen or pelvis. Reproductive: Grossly normal uterus.  No adnexal mass. Other: No pneumoperitoneum, ascites or focal fluid collection. Musculoskeletal: No aggressive appearing focal osseous lesions. Mild thoracolumbar spondylosis. IMPRESSION: 1. Obstructing 4 mm right UVJ stone with mild to moderate right hydroureteronephrosis. Additional nonobstructing stones in both kidneys. 2. Nonspecific scattered gas in the left greater the right renal collecting systems and in the nondependent bladder. Findings are nonspecific and could be due to recent bladder instrumentation or gas-forming infection. 3. Nonspecific  diffuse mild urothelial wall thickening in the right renal collecting system and right ureter, cannot exclude superimposed infection. 4. Asymmetric moderate left renal cortical scarring. 5. Small hiatal hernia. 6. Stable left adrenal adenoma. 7. Aortic Atherosclerosis (ICD10-I70.0). Electronically Signed   By: Ilona Sorrel M.D.   On: 03/03/2020 05:47    Microbiology: Recent Results (from the past 240 hour(s))  Blood culture (routine x 2)     Status: None (Preliminary result)   Collection Time: 03/03/20  4:27 AM   Specimen: BLOOD RIGHT HAND  Result Value Ref Range Status   Specimen Description   Final    BLOOD RIGHT HAND Performed at [facility] [address] [place], [place] [address]  Special Requests   Final    BOTTLES DRAWN AEROBIC AND ANAEROBIC Blood Culture adequate volume Performed at [facility] [address] [place], [place] [address]    Culture   Final    NO GROWTH 1 DAY Performed at [facility], [place] [address] [place], [place] [address]    Report Status PENDING  Incomplete  Blood culture (routine x 2)     Status: Abnormal (Preliminary result)   Collection Time: 03/03/20  4:36 AM   Specimen: BLOOD RIGHT FOREARM  Result Value Ref Range Status   Specimen Description   Final    BLOOD RIGHT FOREARM Performed at [facility] [address] [place], [place] [address]    Special Requests   Final    BOTTLES DRAWN AEROBIC AND ANAEROBIC Blood Culture results may not be optimal due to an inadequate volume of blood received in culture bottles Performed at [facility] [address], [place], [place] [address]    Culture  Setup Time   Final    AEROBIC BOTTLE ONLY GRAM NEGATIVE RODS CRITICAL RESULT CALLED TO, READ BACK BY AND VERIFIED WITH: [place] 6 1 2021 Performed at [facility], [place] [address] [place], [place] [address]    Culture  ESCHERICHIA COLI (A)  Final   Report Status PENDING  Incomplete  Blood Culture ID Panel (Reflexed)     Status: Abnormal   Collection Time: 03/03/20  4:36 AM  Result Value Ref Range Status   Enterococcus species NOT DETECTED NOT DETECTED Final   Listeria monocytogenes NOT DETECTED NOT DETECTED Final   Staphylococcus species NOT DETECTED NOT DETECTED Final   Staphylococcus aureus (BCID) NOT DETECTED NOT DETECTED Final   Streptococcus species NOT DETECTED NOT DETECTED Final   Streptococcus agalactiae NOT DETECTED NOT DETECTED Final   Streptococcus pneumoniae NOT DETECTED NOT DETECTED Final   Streptococcus pyogenes NOT DETECTED NOT DETECTED Final   Acinetobacter baumannii NOT DETECTED NOT DETECTED Final   Enterobacteriaceae species DETECTED (A) NOT DETECTED Final    Comment: Enterobacteriaceae represent a large family of gram-negative bacteria, not a single organism. CRITICAL RESULT CALLED TO, READ BACK BY AND VERIFIED WITH: PHARMD GIMSLEY J. AT 0024 BY MESSAN H. ON 6 1 2021    Enterobacter cloacae complex NOT DETECTED NOT DETECTED Final   Escherichia coli DETECTED (A) NOT DETECTED Final    Comment: CRITICAL RESULT CALLED TO, READ BACK BY AND VERIFIED WITH: PHARMD GIMSLEY J. AT 0024 BY MESSAN H. ON 6 1 2021    Klebsiella oxytoca NOT DETECTED NOT DETECTED Final   Klebsiella pneumoniae NOT DETECTED NOT DETECTED Final   Proteus species NOT DETECTED NOT DETECTED Final   Serratia marcescens NOT DETECTED NOT DETECTED Final   Carbapenem resistance NOT DETECTED NOT DETECTED Final   Haemophilus influenzae NOT DETECTED NOT DETECTED Final   Neisseria meningitidis NOT DETECTED NOT DETECTED Final   Pseudomonas aeruginosa NOT DETECTED NOT DETECTED Final   Candida albicans NOT DETECTED NOT DETECTED Final   Candida glabrata NOT DETECTED NOT DETECTED Final   Candida krusei NOT DETECTED NOT DETECTED Final   Candida parapsilosis NOT DETECTED NOT DETECTED Final   Candida tropicalis NOT DETECTED NOT  DETECTED Final    Comment: Performed at [facility], [place] [address], [place], [place] [address]  Urine culture     Status: Abnormal (Preliminary result)   Collection Time: 03/03/20  5:42 AM  Specimen: Urine, Clean Catch  Result Value Ref Range Status   Specimen Description   Final    Urine Performed at [facility] [address], [place], [place] [address]    Special Requests   Final    NONE Performed at [facility], [place] [address] [place], [place] [address]    Culture (A)  Final    >=100,000 COLONIES/mL ESCHERICHIA COLI SUSCEPTIBILITIES TO FOLLOW Performed at [facility], [place] [address], [place], [place] [address]    Report Status PENDING  Incomplete  SARS Coronavirus 2 by RT PCR (hospital order, performed in [facility] hospital lab) Nasopharyngeal Nasopharyngeal Swab     Status: None   Collection Time: 03/03/20  6:03 AM   Specimen: Nasopharyngeal Swab  Result Value Ref Range Status   SARS Coronavirus 2 NEGATIVE NEGATIVE Final    Comment: (NOTE) SARS-CoV-2 target nucleic acids are NOT DETECTED. The SARS-CoV-2 RNA is generally detectable in upper and lower respiratory specimens during the acute phase of infection. The lowest concentration of SARS-CoV-2 viral copies this assay can detect is 250 copies / mL. A negative result does not preclude SARS-CoV-2 infection and should not be used as the sole basis for treatment or other patient management decisions.  A negative result may occur with improper specimen collection / handling, submission of specimen other than nasopharyngeal swab, presence of viral mutation(s) within the areas targeted by this assay, and inadequate number of viral copies (<250 copies / mL). A negative result must be combined with clinical observations, patient history, and epidemiological information. Fact Sheet for Patients:   StrictlyIdeas.no Fact Sheet for Healthcare  Providers: BankingDealers.co.za This test is not yet approved or cleared  by the [place] FDA and has been authorized for detection and/or diagnosis of SARS-CoV-2 by FDA under an Emergency Use Authorization (EUA).  This EUA will remain in effect (meaning this test can be used) for the duration of the COVID-19 declaration under Section 564(b)(1) of the Act, 21 U.S.C. section 360bbb-3(b)(1), unless the authorization is terminated or revoked sooner. Performed at [facility], [place] [address] [place], [place] [address]      Labs: Basic Metabolic Panel: Recent Labs  Lab 03/03/20 0356 03/04/20 0422 03/04/20 0853  NA 131* 138  --   K 4.2 5.3* 4.3  CL 102 109  --   CO2 19* 21*  --   GLUCOSE 446* 172*  --   BUN 21* 24*  --   CREATININE 2.42* 2.29*  --   CALCIUM 9.6 9.1  --    Liver Function Tests: No results for input(s): AST, ALT, ALKPHOS, BILITOT, PROT, ALBUMIN in the last 168 hours. No results for input(s): LIPASE, AMYLASE in the last 168 hours. No results for input(s): AMMONIA in the last 168 hours. CBC: Recent Labs  Lab 03/03/20 0356 03/04/20 0422  WBC 29.8* 19.4*  NEUTROABS 26.8*  --   HGB 15.3* 12.5  HCT 46.2* 39.6  MCV 89.7 93.4  PLT 336 268   Cardiac Enzymes: No results for input(s): CKTOTAL, CKMB, CKMBINDEX, TROPONINI in the last 168 hours. BNP: BNP (last 3 results) No results for input(s): BNP in the last 8760 hours.  ProBNP (last 3 results) No results for input(s): PROBNP in the last 8760 hours.  CBG: Recent Labs  Lab 03/03/20 1112 03/03/20 1642 03/03/20 2119 03/04/20 0732 03/04/20 1136  GLUCAP 191* 269* 272* 152* 206*       Signed:  Nita Sells MD  [facility] 03/04/2020, 2:14 PM

## 2020-03-04 NOTE — TOC Transition Note (Signed)
Transition of Care [facility]) - CM/SW Discharge Note   Patient Details  Name: Terri Sparks MRN: [id number] Date of Birth: 11/28/1975  Transition of Care [facility]) CM/SW Contact:  Dessa Phi, RN Phone Number: 03/04/2020, 2:42 PM   Clinical Narrative: Checked for [facility] program-patient ineligible-used 09/23/19. Patient stated she has $3 for new abx-she says she has all of the meds prior to admission, & she understands narcotics-she would pay for. No further CM needs.            Patient Goals and CMS Choice        Discharge Placement                       Discharge Plan and Services                                     Social Determinants of Health (SDOH) Interventions     Readmission Risk Interventions Readmission Risk Prevention Plan 05/19/2018  Transportation Screening Complete  Home Care Screening Complete  HRI or Home Care Consult Complete  Medication Review (RN Care Manager) Complete  Some recent data might be hidden

## 2020-03-05 ENCOUNTER — Telehealth: Payer: Self-pay | Admitting: Internal Medicine

## 2020-03-05 DIAGNOSIS — E875 Hyperkalemia: Secondary | ICD-10-CM

## 2020-03-05 LAB — CULTURE, BLOOD (ROUTINE X 2)

## 2020-03-05 LAB — URINE CULTURE: Culture: >=100000 — AB

## 2020-03-05 MED ORDER — CEFDINIR 300 MG PO CAPS: 300.0000 mg | ORAL_CAPSULE | Freq: Every day | ORAL | 0 refills | Status: DC

## 2020-03-05 MED FILL — CEFDINIR 300 MG CAPSULE: 300 | 12 days supply | Qty: 12 | Fill #0

## 2020-03-05 NOTE — Telephone Encounter (Signed)
Phone call placed to patient today.  I left message on her voicemail informing her that I received the results of the blood and urine cultures that were done when she was in the hospital recently.  Cultures are growing E. coli.  She was discharged on cephalexin and the bacteria is not sensitive to this antibiotic.  I told her that I would like to change the antibiotic to [facility] and I have sent that antibiotic to her pharmacy.  I also informed her that her potassium level was mildly elevated when she was discharged from the hospital yesterday.  I would like to repeat this level.  She can stop at the lab when she comes to pick up the antibiotic.

## 2020-03-06 ENCOUNTER — Telehealth: Payer: Self-pay

## 2020-03-06 MED FILL — traMADol HCL 50 MG TABS: 50 | 15 days supply | Qty: 15 | Fill #0

## 2020-03-06 NOTE — Telephone Encounter (Signed)
Transition Care Management Follow-up Telephone Call Date of discharge and from where: 06/01//2021[facility] How have you been since you were released from the hospital? Not too good / Stated she did not have money to get her antibiotics.   Any questions or concerns? None  Items Reviewed: Did the pt receive and understand the discharge instructions provided? YES Medications obtained and verified? No / addressed with pt this issue/ explained that [facility] pharmacy have patient assistance program and they could help. Stated she will come and pick them up today   Any new allergies since your discharge? NONE Dietary orders reviewed? Yes  Do you have support at home? None   Functional Questionnaire: (I = Independent and D = Dependent) ADLs: I   Follow up appointments reviewed:  PCP Hospital f/u appt confirmed?  Scheduled to see Dr Wynetta Emery  on 03/25/2020   Specialist Hospital f/u appt confirmed? Not yet   Are transportation arrangements needed? NO  If their condition worsens, /is the pt aware to call PCP or go to the Emergency Dept.?   Pt is aware if condition is worsening or start experiencing any of diff breathing, SOB, chest pain,fever, extreme fatigue,  Persistent nausea and vomiting, bleeding , rapid weight gain, severe uncontrolled pain, or visual disturbances to return to ED  Was the patient provided with contact information for the PCP's office or ED? YES given.  Was to pt encouraged to call back with questions or concerns?YES name and contact information given   Stressed the importance to pick up her meds from pharmacy and strict DM control. Advised pt to keep scheduled appt and f /u with PCP.   Reinforced Dr Wynetta Emery message "   I told her that I would like to change the antibiotic to [facility] and I have sent that antibiotic to her pharmacy.  I also informed her that her potassium level was mildly elevated when she was discharged from the hospital yesterday.  I would like to repeat this  level.  She can stop at the lab when she comes to pick up the antibiotic." Verbalized understanding

## 2020-03-07 ENCOUNTER — Ambulatory Visit: Payer: Medicaid Other | Admitting: Hematology

## 2020-03-07 ENCOUNTER — Other Ambulatory Visit: Payer: Self-pay | Admitting: Urology

## 2020-03-08 LAB — CULTURE, BLOOD (ROUTINE X 2)
Culture: NO GROWTH
Special Requests: ADEQUATE

## 2020-03-10 ENCOUNTER — Encounter (HOSPITAL_COMMUNITY): Payer: Self-pay | Admitting: Emergency Medicine

## 2020-03-10 ENCOUNTER — Other Ambulatory Visit: Payer: Self-pay

## 2020-03-10 ENCOUNTER — Emergency Department (HOSPITAL_COMMUNITY)
Admission: EM | Admit: 2020-03-10 | Discharge: 2020-03-10 | Disposition: A | Discharge status: Home / Self Care | Payer: Medicaid Other | Attending: Emergency Medicine | Admitting: Emergency Medicine

## 2020-03-10 ENCOUNTER — Emergency Department (HOSPITAL_COMMUNITY): Payer: Medicaid Other

## 2020-03-10 DIAGNOSIS — Z79899 Other long term (current) drug therapy: Secondary | ICD-10-CM | POA: Insufficient documentation

## 2020-03-10 DIAGNOSIS — Y849 Medical procedure, unspecified as the cause of abnormal reaction of the patient, or of later complication, without mention of misadventure at the time of the procedure: Secondary | ICD-10-CM | POA: Insufficient documentation

## 2020-03-10 DIAGNOSIS — E119 Type 2 diabetes mellitus without complications: Secondary | ICD-10-CM | POA: Insufficient documentation

## 2020-03-10 DIAGNOSIS — Y999 Unspecified external cause status: Secondary | ICD-10-CM | POA: Insufficient documentation

## 2020-03-10 DIAGNOSIS — I10 Essential (primary) hypertension: Secondary | ICD-10-CM | POA: Diagnosis not present

## 2020-03-10 DIAGNOSIS — F17290 Nicotine dependence, other tobacco product, uncomplicated: Secondary | ICD-10-CM | POA: Diagnosis not present

## 2020-03-10 DIAGNOSIS — S39012A Strain of muscle, fascia and tendon of lower back, initial encounter: Secondary | ICD-10-CM | POA: Insufficient documentation

## 2020-03-10 DIAGNOSIS — M5489 Other dorsalgia: Secondary | ICD-10-CM | POA: Diagnosis not present

## 2020-03-10 DIAGNOSIS — R300 Dysuria: Secondary | ICD-10-CM | POA: Diagnosis not present

## 2020-03-10 DIAGNOSIS — I129 Hypertensive chronic kidney disease with stage 1 through stage 4 chronic kidney disease, or unspecified chronic kidney disease: Secondary | ICD-10-CM | POA: Diagnosis not present

## 2020-03-10 DIAGNOSIS — Z880 Allergy status to penicillin: Secondary | ICD-10-CM | POA: Insufficient documentation

## 2020-03-10 DIAGNOSIS — Y939 Activity, unspecified: Secondary | ICD-10-CM | POA: Diagnosis not present

## 2020-03-10 DIAGNOSIS — R109 Unspecified abdominal pain: Secondary | ICD-10-CM | POA: Diagnosis present

## 2020-03-10 DIAGNOSIS — N1832 Chronic kidney disease, stage 3b: Secondary | ICD-10-CM | POA: Diagnosis not present

## 2020-03-10 DIAGNOSIS — Y929 Unspecified place or not applicable: Secondary | ICD-10-CM | POA: Diagnosis not present

## 2020-03-10 DIAGNOSIS — R52 Pain, unspecified: Secondary | ICD-10-CM | POA: Diagnosis not present

## 2020-03-10 DIAGNOSIS — Z794 Long term (current) use of insulin: Secondary | ICD-10-CM | POA: Diagnosis not present

## 2020-03-10 DIAGNOSIS — R1084 Generalized abdominal pain: Secondary | ICD-10-CM | POA: Diagnosis not present

## 2020-03-10 LAB — CBC
HCT: 41.1 % (ref 36.0–46.0)
Hemoglobin: 13.1 g/dL (ref 12.0–15.0)
MCH: 29.5 pg (ref 26.0–34.0)
MCHC: 31.9 g/dL (ref 30.0–36.0)
MCV: 92.6 fL (ref 80.0–100.0)
Platelets: 398 10*3/uL (ref 150–400)
RBC: 4.44 MIL/uL (ref 3.87–5.11)
RDW: 14.6 % (ref 11.5–15.5)
WBC: 13.1 10*3/uL — ABNORMAL HIGH (ref 4.0–10.5)
nRBC: 0 % (ref 0.0–0.2)

## 2020-03-10 LAB — URINALYSIS, ROUTINE W REFLEX MICROSCOPIC
Bilirubin Urine: NEGATIVE
Glucose, UA: 50 mg/dL — AB
Ketones, ur: NEGATIVE mg/dL
Nitrite: NEGATIVE
Protein, ur: 30 mg/dL — AB
Specific Gravity, Urine: 1.011 (ref 1.005–1.030)
pH: 6 (ref 5.0–8.0)

## 2020-03-10 LAB — BASIC METABOLIC PANEL
Anion gap: 6 (ref 5–15)
BUN: 22 mg/dL — ABNORMAL HIGH (ref 6–20)
CO2: 25 mmol/L (ref 22–32)
Calcium: 9.5 mg/dL (ref 8.9–10.3)
Chloride: 102 mmol/L (ref 98–111)
Creatinine, Ser: 2.14 mg/dL — ABNORMAL HIGH (ref 0.44–1.00)
GFR calc Af Amer: 31 mL/min — ABNORMAL LOW (ref >60)
GFR calc non Af Amer: 27 mL/min — ABNORMAL LOW (ref >60)
Glucose, Bld: 230 mg/dL — ABNORMAL HIGH (ref 70–99)
Potassium: 4.8 mmol/L (ref 3.5–5.1)
Sodium: 133 mmol/L — ABNORMAL LOW (ref 135–145)

## 2020-03-10 LAB — I-STAT BETA HCG BLOOD, ED (MC, WL, AP ONLY): I-stat hCG, quantitative: <5 m[IU]/mL (ref <5)

## 2020-03-10 LAB — URINE CULTURE: Culture: <10000 — AB

## 2020-03-10 MED ORDER — ONDANSETRON HCL 4 MG/2ML IJ SOLN
4.0000 mg | Freq: Once | INTRAMUSCULAR | Status: AC
  Administered 2020-03-10: 4 mg via INTRAVENOUS
  Filled 2020-03-10: qty 2

## 2020-03-10 MED ORDER — MORPHINE SULFATE (PF) 4 MG/ML IV SOLN
4.0000 mg | Freq: Once | INTRAVENOUS | Status: AC
  Administered 2020-03-10: 4 mg via INTRAVENOUS
  Filled 2020-03-10: qty 1

## 2020-03-10 MED ORDER — METHOCARBAMOL 500 MG PO TABS
500.0000 mg | ORAL_TABLET | Freq: Two times a day (BID) | ORAL | 0 refills | Status: DC
Start: 2020-03-10 — End: 2021-02-17

## 2020-03-10 MED ORDER — LORAZEPAM 1 MG PO TABS
1.0000 mg | ORAL_TABLET | Freq: Once | ORAL | Status: AC
  Administered 2020-03-10: 1 mg via ORAL
  Filled 2020-03-10: qty 1

## 2020-03-10 NOTE — Discharge Instructions (Signed)
Take the muscle relaxer in addition to the medications you are prescribed previously.  Continue taking your antibiotics as prescribed. Follow-up with your primary care provider or neurologist. Return to the ER for worsening pain, fevers, chest pain or shortness of breath.

## 2020-03-10 NOTE — ED Provider Notes (Signed)
[facility] DEPT Provider Note   CSN: [id number] Arrival date & time: 03/10/20  0107     History Chief Complaint  Patient presents with  . Flank Pain    Terri Sparks is a 44 y.o. female with a past medical history of hypertension, admission last week for sepsis secondary to infected right kidney stone status post stent placement on Omnicef currently presenting to the ED with a chief complaint of left-sided flank pain.  Symptoms began approximately 8 to 9 hours ago.  She was concerned due to her recent history of sepsis and wanted to "get checked out before I got worse."  She takes tramadol with only minimal improvement in her symptoms.  She reports pressure sensation in her bladder.  She denies any fever, hematuria, changes to bowel movements, vomiting, nausea, chest pain, injuries or falls.  HPI     Past Medical History:  Diagnosis Date  . Anxiety   . Depression   . Diabetes ([place])    Type 2  . GERD (gastroesophageal reflux disease)   . History of blood clots    ,DVT-left leg, and early 2000, had blood clot behind left breast  . History of kidney stones   . Hypercalcemia   . Hyperparathyroidism   . Hypertension    had been on Lisinopril and PCP took her off it 4 months ago  . Iron deficiency anemia   . Left thyroid nodule   . Mass of right ovary   . Nephrolithiasis   . 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 Peripheral vascular disease ([place])   . Prolonged Q-T interval on ECG 04/19/2019  . Renal calculi   . Substance abuse ([place])   . Tooth loose    top tooth from the right  is loose     Patient Active Problem List   Diagnosis Date Noted  . Sepsis ([place]) 03/03/2020  . Homeless 11/13/2019  . Chest pain at rest 09/24/2019  . Gastritis 09/24/2019  . Sinus tachycardia 09/24/2019  . Sepsis secondary to UTI ([place]) 09/23/2019  . Polysubstance abuse ([place]) 09/23/2019  . Atypical chest pain   . Stage 3b chronic kidney disease  07/30/2019  . Iron deficiency anemia due to chronic blood loss 06/12/2019  . Vitamin B12 deficiency 06/12/2019  . Folate deficiency 06/12/2019  . Constipation 06/12/2019  . Prolonged QT interval   . Ureteral stone with hydronephrosis 06/11/2019  . Acute renal failure superimposed on stage 3 chronic kidney disease ([place]) 06/26/2018  . Right ureteral stone 06/26/2018  . Essential hypertension 06/26/2018  . Subtherapeutic international normalized ratio (INR) 06/26/2018  . Adrenal nodule ([place]) 06/21/2018  . Lung nodules 06/21/2018  . Abnormal uterine bleeding (AUB) 05/14/2018  . Hemorrhagic cyst of right ovary 05/14/2018  . Ischemia of extremity 05/12/2018  . Former smoker 03/14/2018  . 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 Thromboembolism ([place]) 02/25/2018  . Microalbuminuria 09/29/2017  . Insomnia 09/29/2017  . Immunization due 08/01/2017  . Anxiety and depression 04/28/2017  . 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 Tobacco abuse 02/25/2017  . Insulin-requiring or dependent type II diabetes mellitus ([place]) 02/25/2017  . Peripheral vascular disease of lower extremity ([place]) 02/24/2017  . GERD (gastroesophageal reflux disease)   . Hyperparathyroidism     Past Surgical History:  Procedure Laterality Date  . ABDOMINAL AORTOGRAM W/LOWER EXTREMITY N/A 11/14/2017   Procedure: ABDOMINAL AORTOGRAM W/LOWER EXTREMITY;  Surgeon: Donzetta Matters,  Georgia Dom, MD;  Location: [facility];  Service: Cardiovascular;  Laterality: N/A;  . ANGIOPLASTY ILLIAC ARTERY Left 02/25/2018   Procedure: BALLOON ANGIOPLASTY LEFT ILIAC ARTERY;  Surgeon: Conrad Oakley, MD;  Location: [facility];  Service: Vascular;  Laterality: Left;  . APPLICATION OF WOUND VAC Left 03/03/2018   Procedure: APPLICATION OF WOUND VAC;  Surgeon: Angelia Mould, MD;  Location: [facility];  Service: Vascular;  Laterality: Left;  . CYSTOSCOPY W/ URETERAL STENT  PLACEMENT Right 06/27/2018   Procedure: CYSTOSCOPY WITH RETROGRADE PYELOGRAM/URETERAL DOUBLE J STENT PLACEMENT;  Surgeon: Ceasar Mons, MD;  Location: [facility];  Service: Urology;  Laterality: Right;  . CYSTOSCOPY WITH STENT PLACEMENT N/A 02/23/2019   Procedure: CYSTOSCOPY WITH RIGHT URETERAL STENT EXCHANGE/ LEFT URETERAL STENT PLACEMENT;  Surgeon: Ceasar Mons, MD;  [facility];  Service: Urology;  Laterality: N/A;  . CYSTOSCOPY WITH STENT PLACEMENT Right 06/11/2019   Procedure: CYSTOSCOPY,RIGHT RETROGRADE WITH STENT PLACEMENT;  Surgeon: Irine Seal, MD;  Location: [facility];  Service: Urology;  Laterality: Right;  . CYSTOSCOPY WITH STENT PLACEMENT Right 03/03/2020   Procedure: CYSTOSCOPY WITH STENT PLACEMENT retroagrade pylerogram;  Surgeon: Ardis Hughs, MD;  Location[facility];  Service: Urology;  Laterality: Right;  . CYSTOSCOPY/RETROGRADE/URETEROSCOPY/STONE EXTRACTION WITH BASKET Bilateral 05/01/2019   Procedure: CYSTOSCOPY/URETEROSCOPY/STONE EXTRACTION / LASER LITHOTRIPSY, BILATERAL URETEROSCOPY WITH URETERAL STONE EXTRACTION AND STENT EXCHANGE;  Surgeon: Ceasar Mons, MD;  Location: [facility];  Service: Urology;  Laterality: Bilateral;  . CYSTOSCOPY/URETEROSCOPY/HOLMIUM LASER/STENT PLACEMENT Right 06/27/2019   Procedure: CYSTOSCOPY/URETEROSCOPY/HOLMIUM LASER/STENT EXCHANGE;  Surgeon: Ceasar Mons, MD;  [facility];  Service: Urology;  Laterality: Right;  . EMBOLECTOMY Left 02/24/2017   Procedure: Left Lower Extremity Embolectomy and Angiogram.;  Surgeon: Waynetta Sandy, MD;  Location: [facility];  Service: Vascular;  Laterality: Left;  . INSERTION OF ILIAC STENT  02/24/2017   Procedure: INSERTION OF Common ILIAC STENT;  Surgeon: Waynetta Sandy, MD;  Location: [facility];  Service: Vascular;;  . INTRAOPERATIVE ARTERIOGRAM Left 02/25/2018   Procedure: INTRA OPERATIVE ARTERIOGRAM WITH LEFT LEG RUNOFF;  Surgeon:  Conrad Louann, MD;  Location: [facility];  Service: Vascular;  Laterality: Left;  . LOWER EXTREMITY ANGIOGRAM Left 02/24/2017   Procedure: Aortagram, Left lower extremity Run-off;  Surgeon: Waynetta Sandy, MD;  Location: [facility];  Service: Vascular;  Laterality: Left;  . PATCH ANGIOPLASTY Left 02/25/2018   Procedure: PATCH ANGIOPLASTY LEFT SUPERFICIAL FEMORAL ARTERY WITH BOVINE PATCH;  Surgeon: Conrad Mahtomedi, MD;  Location: [facility];  Service: Vascular;  Laterality: Left;  . PERCUTANEOUS VENOUS THROMBECTOMY,LYSIS WITH INTRAVASCULAR ULTRASOUND (IVUS) Left 05/12/2018   Procedure: MECHANICAL THROMBECTOMY LEFT LEG, BALLOON ANGIOPLASTY LEFT ANTERIOR TIBIAL ARTERY, AORTOGRAM WITH LEFT LEG RUNOFF;  Surgeon: Serafina Mitchell, MD;  Location: [facility];  Service: Vascular;  Laterality: Left;  . 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 THROMBECTOMY FEMORAL ARTERY Left 02/25/2018   Procedure: LEFT ILIAC AND POPLITEAL ARTERY THROMBECTOMY;  Surgeon: Conrad Hephzibah, MD;  Location: [facility];  Service: Vascular;  Laterality: Left;  . TUBAL LIGATION    . WOUND DEBRIDEMENT Left 03/03/2018   Procedure: DEBRIDEMENT WOUND;  Surgeon: Angelia Mould, MD;  Location: [facility];  Service: Vascular;  Laterality: Left;  . WOUND EXPLORATION Left 03/03/2018   Procedure: WOUND EXPLORATION;  Surgeon: Angelia Mould, MD;  Location: [facility];  Service: Vascular;  Laterality: Left;     OB History   No obstetric history  on file.     Family History  Problem Relation Age of Onset  . Depression Mother   . Diabetes Father   . Heart failure Father     Social History   Tobacco Use  . Smoking status: Current Some Day Smoker    Packs/day: 0.30    Years: 15.00    Pack years: 4.50    Types: Cigarettes  . Smokeless tobacco: Never Used  Substance Use Topics  . Alcohol use: Yes    Comment: occassionally  . Drug use: Yes    Types: Marijuana, Cocaine    Comment: once daily- last time used was night of 02/21/2019    Home  Medications Prior to Admission medications   Medication Sig Start Date End Date Taking? Authorizing Provider  acetaminophen (TYLENOL) 500 MG tablet Take 2,000 mg by mouth daily as needed (for headaches).     [provider]  albuterol (PROAIR HFA) 108 (90 Base) MCG/ACT inhaler Inhale 2 puffs into the lungs every 6 (six) hours as needed for wheezing or shortness of breath. 09/24/19   Darliss Cheney, MD  amLODipine (NORVASC) 5 MG tablet Take 1 tablet (5 mg total) by mouth daily. 09/24/19   Darliss Cheney, MD  apixaban (ELIQUIS) 5 MG TABS tablet Take 1 tablet (5 mg total) by mouth 2 (two) times daily. 11/12/19   Ladell Pier, MD  atorvastatin (LIPITOR) 10 MG tablet Take 1 tablet (10 mg total) by mouth daily. 01/23/20   Ladell Pier, MD  buPROPion (WELLBUTRIN SR) 150 MG 12 hr tablet Take 1 tablet (150 mg total) by mouth 2 (two) times daily. 09/24/19   Darliss Cheney, MD  carvedilol (COREG) 3.125 MG tablet Take 1 tablet (3.125 mg total) by mouth 2 (two) times daily. 02/01/20 05/01/20  Elouise Munroe, MD  cefdinir (OMNICEF) 300 MG capsule Take 1 capsule (300 mg total) by mouth daily. 03/05/20   Ladell Pier, MD  cephALEXin (KEFLEX) 500 MG capsule Take 1 capsule (500 mg total) by mouth 4 (four) times daily for 12 days. 03/04/20 03/16/20  Nita Sells, MD  ferrous sulfate 325 (65 FE) MG tablet Take 1 tablet (325 mg total) by mouth daily with breakfast. 03/04/20   Nita Sells, MD  folic acid (FOLVITE) 1 MG tablet Take 1 tablet (1 mg total) by mouth daily. 09/24/19   Darliss Cheney, MD  gabapentin (NEURONTIN) 400 MG capsule Take 1 capsule (400 mg total) by mouth 3 (three) times daily. 09/24/19   Darliss Cheney, MD  hydrOXYzine (ATARAX/VISTARIL) 50 MG tablet Take 1 tablet (50 mg total) by mouth 3 (three) times daily as needed for itching or anxiety. 09/24/19   Darliss Cheney, MD  ibuprofen (ADVIL) 800 MG tablet Take 1 tablet (800 mg total) by mouth every 8 (eight) hours as needed.  11/20/19   Constant, Peggy, MD  insulin aspart (NOVOLOG) 100 UNIT/ML injection Inject 5-15 Units into the skin 3 (three) times daily before meals. 09/24/19   Darliss Cheney, MD  insulin glargine (LANTUS) 100 UNIT/ML injection Inject 0.62 mLs (62 Units total) into the skin at bedtime. 03/04/20   Nita Sells, MD  Insulin Pen Needle 31G X 5 MM MISC Use with Lantus and Novolog injections. 04/19/19   Ladell Pier, MD  Insulin Syringe-Needle U-100 (BD INSULIN SYRINGE ULTRAFINE) 31G X 5/16" 0.5 ML MISC Use as directed 04/28/17   Ladell Pier, MD  Insulin Syringes, Disposable, U-100 1 ML MISC Use as directed 09/24/19   Darliss Cheney, MD  lip balm (CARMEX) ointment Apply topically as needed for lip care. 06/16/19   Jani Gravel, MD  loratadine (CLARITIN) 10 MG tablet Take 1 tablet (10 mg total) by mouth daily as needed for allergies. 09/24/19   Darliss Cheney, MD  megestrol (MEGACE) 40 MG tablet Take 1 tablet (40 mg total) by mouth 2 (two) times daily. Can increase to two tablets twice a day in the event of heavy bleeding 12/27/19   Chancy Milroy, MD  methocarbamol (ROBAXIN) 500 MG tablet Take 1 tablet (500 mg total) by mouth 2 (two) times daily. 03/10/20   Analena Gama, PA-C  nitroGLYCERIN (NITROSTAT) 0.4 MG SL tablet Place 1 tablet (0.4 mg total) under the tongue every 5 (five) minutes as needed for chest pain. 09/24/19   Darliss Cheney, MD  omeprazole (PRILOSEC) 20 MG capsule Take 1 capsule (20 mg total) by mouth 2 (two) times daily before a meal. 09/24/19 03/03/20  Darliss Cheney, MD  senna-docusate (SENOKOT-S) 8.6-50 MG tablet Take 1 tablet by mouth 2 (two) times daily. Patient not taking: Reported on 03/03/2020 09/24/19   Darliss Cheney, MD  sertraline (ZOLOFT) 50 MG tablet Take 1 tablet (50 mg total) by mouth daily. 1/2 tab daily x 2 wks then 1 tab PO daily Patient taking differently: Take 50 mg by mouth daily.  09/24/19   Darliss Cheney, MD  sucralfate (CARAFATE) 1 GM/10ML suspension Take 10 mLs  (1 g total) by mouth 2 (two) times daily with breakfast and lunch. 11/13/19   Ladell Pier, MD  TRUEplus Lancets 28G MISC Use as directed 11/13/19   Ladell Pier, MD  vitamin B-12 (CYANOCOBALAMIN) 1000 MCG tablet Take 2 tablets (2,000 mcg total) by mouth daily. 09/24/19   Darliss Cheney, MD    Allergies    Ciprofloxacin hcl, Dilaudid [hydromorphone hcl], Macrobid [nitrofurantoin macrocrystal], Other, Penicillins, Sulfa antibiotics, and Lexapro [escitalopram oxalate]  Review of Systems   Review of Systems  Constitutional: Negative for appetite change, chills and fever.  HENT: Negative for ear pain, rhinorrhea, sneezing and sore throat.   Eyes: Negative for photophobia and visual disturbance.  Respiratory: Negative for cough, chest tightness, shortness of breath and wheezing.   Cardiovascular: Negative for chest pain and palpitations.  Gastrointestinal: Negative for abdominal pain, blood in stool, constipation, diarrhea, nausea and vomiting.  Genitourinary: Positive for flank pain. Negative for dysuria, hematuria and urgency.  Musculoskeletal: Negative for myalgias.  Skin: Negative for rash.  Neurological: Negative for dizziness, weakness and light-headedness.    Physical Exam Updated Vital Signs BP 137/83 (BP Location: Left Arm)   Pulse 74   Temp 98.5 F (36.9 C) (Oral)   Resp 18   Ht 5\' 7"  (1.702 m)   Wt 114.3 kg   SpO2 99%   BMI 39.47 kg/m   Physical Exam Vitals and nursing note reviewed.  Constitutional:      General: She is not in acute distress.    Appearance: She is well-developed.  HENT:     Head: Normocephalic and atraumatic.     Nose: Nose normal.  Eyes:     General: No scleral icterus.       Left eye: No discharge.     Conjunctiva/sclera: Conjunctivae normal.  Cardiovascular:     Rate and Rhythm: Normal rate and regular rhythm.     Heart sounds: Normal heart sounds. No murmur. No friction rub. No gallop.   Pulmonary:     Effort: Pulmonary effort is  normal. No respiratory distress.  Breath sounds: Normal breath sounds.  Abdominal:     General: Bowel sounds are normal. There is no distension.     Palpations: Abdomen is soft.     Tenderness: There is no abdominal tenderness. There is left CVA tenderness. There is no guarding.  Musculoskeletal:        General: Normal range of motion.     Cervical back: Normal range of motion and neck supple.  Skin:    General: Skin is warm and dry.     Findings: No rash.  Neurological:     Mental Status: She is alert.     Motor: No abnormal muscle tone.     Coordination: Coordination normal.     ED Results / Procedures / Treatments   Labs (all labs ordered are listed, but only abnormal results are displayed) Labs Reviewed  URINALYSIS, ROUTINE W REFLEX MICROSCOPIC - Abnormal; Notable for the following components:      Result Value   Glucose, UA 50 (*)    Hgb urine dipstick MODERATE (*)    Protein, ur 30 (*)    Leukocytes,Ua LARGE (*)    Bacteria, UA RARE (*)    All other components within normal limits  BASIC METABOLIC PANEL - Abnormal; Notable for the following components:   Sodium 133 (*)    Glucose, Bld 230 (*)    BUN 22 (*)    Creatinine, Ser 2.14 (*)    GFR calc non Af Amer 27 (*)    GFR calc Af Amer 31 (*)    All other components within normal limits  CBC - Abnormal; Notable for the following components:   WBC 13.1 (*)    All other components within normal limits  URINE CULTURE  I-STAT BETA HCG BLOOD, ED ([facility], [facility], [facility] ONLY)    EKG EKG Interpretation  Date/Time:  Monday March 10 2020 02:29:03 EDT Ventricular Rate:  68 PR Interval:    QRS Duration: 94 QT Interval:  456 QTC Calculation: 485 R Axis:   58 Text Interpretation: Sinus rhythm rate improved from prior Otherwise no significant change Confirmed by Addison Lank [phone]) on 03/10/2020 2:50:14 AM   Radiology CT Renal Stone Study  Result Date: 03/10/2020 CLINICAL DATA:  Bilateral flank and back pain. History of  kidney stones. Recent right-sided stent placement. EXAM: CT ABDOMEN AND PELVIS WITHOUT CONTRAST TECHNIQUE: Multidetector CT imaging of the abdomen and pelvis was performed following the standard protocol without IV contrast. COMPARISON:  CT 1 week ago 03/03/2020 FINDINGS: Lower chest: Lung bases are clear. Hepatobiliary: No focal liver abnormality is seen. No gallstones, gallbladder wall thickening, or biliary dilatation. Pancreas: No ductal dilatation or inflammation. Spleen: Normal in size without focal abnormality. Adrenals/Urinary Tract: Normal right adrenal gland. Stable left adrenal adenoma. Right ureteral stent in place with near complete resolution of right hydronephrosis. Trace residual fullness of the right renal pelvis. Tiny stone or stone fragment in the distal right ureter, series 5, image 128 insert of prior ureteral stone. Improvement in perinephric edema which is near completely resolved. Nonobstructing stones in the lower right kidney largest measuring 7 mm. Previous air in the right renal collecting system has resolved. Multifocal left renal cortical scarring and thinning. Nonobstructing stones in the left kidney. No left hydronephrosis. No left perinephric edema. No left ureteral calculi. Previous air in the left renal collecting system has resolved. No stones in the urinary bladder which is partially distended. Stomach/Bowel: Ingested material distends the stomach, as well as the distal esophagus. No gastric  wall thickening. No small bowel obstruction or inflammation. Normal appendix. Moderate volume of stool throughout the colon. No colonic wall thickening or inflammation. Vascular/Lymphatic: Aorto bi-iliac atherosclerosis. Left common iliac artery stent. Surgical clips adjacent to the left femoral vessels. No adenopathy. Reproductive: Uterus and bilateral adnexa are unremarkable. Other: No free air, free fluid, or intra-abdominal fluid collection. Musculoskeletal: There are no acute or  suspicious osseous abnormalities. IMPRESSION: 1. Right ureteral stent in place with near complete resolution of right hydronephrosis and perinephric edema over the past week. Tiny stone or stone fragment in the distal right ureter at the site of prior ureteral stone. 2. Bilateral nonobstructing renal calculi. Left renal cortical scarring and thinning. No left hydronephrosis or obstructive uropathy. 3. Distended stomach with ingested contents, as well as distending the distal esophagus. Query gastroparesis and reflux. 4. Stable left adrenal adenoma. Aortic Atherosclerosis (ICD10-I70.0). Electronically Signed   By: Keith Rake M.D.   On: 03/10/2020 02:51    Procedures Procedures (including critical care time)  Medications Ordered in ED Medications  morphine 4 MG/ML injection 4 mg (4 mg Intravenous Given 03/10/20 0251)  ondansetron (ZOFRAN) injection 4 mg (4 mg Intravenous Given 03/10/20 0256)  LORazepam (ATIVAN) tablet 1 mg (1 mg Oral Given 03/10/20 0256)    ED Course  I have reviewed the triage vital signs and the nursing notes.  Pertinent labs & imaging results that were available during my care of the patient were reviewed by me and considered in my medical decision making (see chart for details).    MDM Rules/Calculators/A&P                      44 year old female with past medical history of hypertension, recent admission last week for sepsis secondary to infected right kidney stone status post stent placement currently on Omnicef presenting to the ED with a chief complaint of left-sided flank pain.  Symptoms began earlier today.  She reports pressure sensation in bladder.  Denies fever, hematuria or changes to bowel movements.  On exam there are translocation of the left flank area.  No abdominal tenderness noted.  She is afebrile without recent use of antipyretics.  Lab work significant for leukocytosis of 13 which is improved from prior during her hospitalization.  Urinalysis with rare  bacteria which is also improved since her hospitalization.  Leukocytes still apparent.  EKG shows sinus rhythm, no evidence of prolonged QT.  Urine sent for culture.  CT renal stone study shows improvement in right hydronephrosis and edema over the past week with stent in place.  No obstructing stone or other abnormality seen on the left kidney.  Patient's symptoms improved here.  Suspect musculoskeletal cause of her symptoms as there is no evidence of any urinary or renal pathology that has worsened.  Her creatinine is at baseline.  Patient is comfortable with discharge home muscle relaxer.  She has tramadol at home that she can take.  We will have her continue following up with urology and returning for worsening symptoms.  All imaging, if done today, including plain films, CT scans, and ultrasounds, independently reviewed by me, and interpretations confirmed via formal radiology reads.  Patient is hemodynamically stable, in NAD, and able to ambulate in the ED. Evaluation does not show pathology that would require ongoing emergent intervention or inpatient treatment. I explained the diagnosis to the patient. Pain has been managed and has no complaints prior to discharge. Patient is comfortable with above plan and is stable for discharge  at this time. All questions were answered prior to disposition. Strict return precautions for returning to the ED were discussed. Encouraged follow up with PCP.   An After Visit Summary was printed and given to the patient.   Portions of this note were generated with [employer]. Dictation errors may occur despite best attempts at proofreading.  Final Clinical Impression(s) / ED Diagnoses Final diagnoses:  Strain of lumbar region, initial encounter  Dysuria    Rx / DC Orders ED Discharge Orders         Ordered    methocarbamol (ROBAXIN) 500 MG tablet  2 times daily     03/10/20 0346           Delia Heady, PA-C 03/10/20 0405    Fatima Blank, MD 03/10/20 [phone]

## 2020-03-10 NOTE — ED Provider Notes (Signed)
Attestation: Medical screening examination/treatment/procedure(s) were conducted as a shared visit with non-physician practitioner(s) and myself.  I personally evaluated the patient during the encounter.   Briefly, the patient is a 44 y.o. female with h/o hypertension, admission last week for sepsis secondary to infected right kidney stone status post stent placement on Omnicef currently presenting to the ED with a chief complaint of left-sided flank pain  Vitals:   03/10/20 0300 03/10/20 0501  BP: (!) 141/96 133/80  Pulse: 74 74  Resp: 16 18  Temp:  98.4 F (36.9 C)  SpO2: 100% 100%    CONSTITUTIONAL:  well-appearing, NAD NEURO:  Alert and oriented x 3, no focal deficits EYES:  pupils equal and reactive ENT/NECK:  trachea midline, no JVD CARDIO:  reg rate, reg rhythm, well-perfused PULM:  None labored breathing GI/GU:  Abdomin non-distended MSK/SPINE:  No gross deformities, no edema SKIN:  no rash, atraumatic PSYCH:  Appropriate speech and behavior   EKG Interpretation  Date/Time:  Monday March 10 2020 02:29:03 EDT Ventricular Rate:  68 PR Interval:    QRS Duration: 94 QT Interval:  456 QTC Calculation: 485 R Axis:   58 Text Interpretation: Sinus rhythm rate improved from prior Otherwise no significant change Confirmed by Addison Lank [phone]) on 03/10/2020 2:50:14 AM       Work up w/o obstructing stone on left. Labs and uring are improving. Tolerating PO. Still on Abx.  The patient appears reasonably screened and/or stabilized for discharge and I doubt any other medical condition or other [facility] requiring further screening, evaluation, or treatment in the ED at this time prior to discharge. Safe for discharge with strict return precautions. Fatima Blank, MD 03/10/20 [phone]

## 2020-03-10 NOTE — ED Triage Notes (Signed)
Patient from home started having bilateral flank and back pain around 6pm that did not improve. Hx of stones, stent placed one wk ago on rt.

## 2020-03-11 ENCOUNTER — Encounter: Payer: Self-pay | Admitting: Internal Medicine

## 2020-03-11 ENCOUNTER — Ambulatory Visit: Payer: Medicaid Other | Admitting: Critical Care Medicine

## 2020-03-11 ENCOUNTER — Ambulatory Visit (INDEPENDENT_AMBULATORY_CARE_PROVIDER_SITE_OTHER): Payer: Medicaid Other | Admitting: Internal Medicine

## 2020-03-11 VITALS — BP 138/88 | HR 88 | Ht 67.0 in | Wt 254.6 lb

## 2020-03-11 DIAGNOSIS — R072 Precordial pain: Secondary | ICD-10-CM

## 2020-03-11 DIAGNOSIS — I749 Embolism and thrombosis of unspecified artery: Secondary | ICD-10-CM

## 2020-03-11 DIAGNOSIS — I739 Peripheral vascular disease, unspecified: Secondary | ICD-10-CM

## 2020-03-11 DIAGNOSIS — I10 Essential (primary) hypertension: Secondary | ICD-10-CM | POA: Diagnosis not present

## 2020-03-11 DIAGNOSIS — I5189 Other ill-defined heart diseases: Secondary | ICD-10-CM

## 2020-03-11 DIAGNOSIS — N939 Abnormal uterine and vaginal bleeding, unspecified: Secondary | ICD-10-CM

## 2020-03-11 DIAGNOSIS — Z01810 Encounter for preprocedural cardiovascular examination: Secondary | ICD-10-CM | POA: Diagnosis not present

## 2020-03-11 DIAGNOSIS — N132 Hydronephrosis with renal and ureteral calculous obstruction: Secondary | ICD-10-CM | POA: Diagnosis not present

## 2020-03-11 NOTE — Patient Instructions (Signed)
.  Medication Instructions:  No changes *If you need a refill on your cardiac medications before your next appointment, please call your pharmacy*   Lab Work: Not needed   Testing/Procedures: Will be schedule at [place] has requested that you have a lexiscan myoview. Please follow instruction sheet, as given.   Follow-Up: At [facility], you and your health needs are our priority.  As part of our continuing mission to provide you with exceptional heart care, we have created designated Provider Care Teams.  These Care Teams include your primary Cardiologist (physician) and Advanced Practice Providers (APPs -  Physician Assistants and Nurse Practitioners) who all work together to provide you with the care you need, when you need it.  We recommend signing up for the patient portal called "MyChart".  Sign up information is provided on this After Visit Summary.  MyChart is used to connect with patients for Virtual Visits (Telemedicine).  Patients are able to view lab/test results, encounter notes, upcoming appointments, etc.  Non-urgent messages can be sent to your provider as well.   To learn more about what you can do with MyChart, go to [email or web address].    Your next appointment:   1 week(s)  The format for your next appointment:   In Person  Provider:   You may see   one of the following Advanced Practice Providers on your designated Care Team:    Rosaria Ferries, PA-C  Jory Sims, DNP, ANP  Cadence Kathlen Mody, NP    Other Instructions Not needed

## 2020-03-11 NOTE — Progress Notes (Signed)
Cardiology Office Note:    Date:  03/11/2020   ID:  [name], DOB Nov 23, 1975, MRN [id number]  PCP:  Ladell Pier, MD  Cardiologist:  No primary care provider on file.  Electrophysiologist:  None   Referring MD: Ladell Pier, MD   Chief Complaint: f/u CV risk assessment  History of Present Illness:    Dreana Britz Scharrer is a 44 y.o. female with a history of DM2, dysfuntional uterine bleeding and obstructing right ureteral stone with stent placement. She presents for follow up of cardiovascular risk prior to procedures.  We discussed the need for stress testing for risk stratification.  We had planned this before, however the patient is struggling with lack of insurance coverage.  Given her reduced ejection fraction, episodic chest pain, risk factors including diabetes, we will need to exclude ischemia to appropriately risk stratify.  She has a urologic procedure planned June 16.  She is seeking cardiovascular optimization prior to that procedure.  Of note she did undergo general anesthesia for urologic procedure on 03/03/2020 without incident.  She denies current chest pain.  She is primarily troubled by flank pain.  Her uterine bleeding has been less lately, but she passed a large clot earlier today and feels she may be on the verge of another heavy cycle.  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  Past Medical History:  Diagnosis Date  . Anxiety   . Depression   . Diabetes ([place])    Type 2  . GERD (gastroesophageal reflux disease)   . History of blood clots    ,DVT-left leg, and early 2000, had blood clot behind left breast  . History of kidney stones   . Hypercalcemia   . Hyperparathyroidism   . Hypertension    had been on Lisinopril and PCP took her off it 4 months ago  . Iron  deficiency anemia   . Left thyroid nodule   . Mass of right ovary   . Nephrolithiasis   . 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 Peripheral vascular disease ([place])   . Prolonged Q-T interval on ECG 04/19/2019  . Renal calculi   . Substance abuse ([place])   . Tooth loose    top tooth from the right  is loose     Past Surgical History:  Procedure Laterality Date  . ABDOMINAL AORTOGRAM W/LOWER EXTREMITY N/A 11/14/2017   Procedure: ABDOMINAL AORTOGRAM W/LOWER EXTREMITY;  Surgeon: Waynetta Sandy, MD;  Location: [facility];  Service: Cardiovascular;  Laterality: N/A;  . ANGIOPLASTY ILLIAC ARTERY Left 02/25/2018   Procedure: BALLOON ANGIOPLASTY LEFT ILIAC ARTERY;  Surgeon: Conrad , MD;  Location: [place];  Service: Vascular;  Laterality: Left;  . APPLICATION OF WOUND VAC Left 03/03/2018   Procedure: APPLICATION OF WOUND VAC;  Surgeon: Angelia Mould, MD;  Location: [place];  Service: Vascular;  Laterality: Left;  . CYSTOSCOPY W/ URETERAL STENT PLACEMENT Right 06/27/2018   Procedure: CYSTOSCOPY WITH RETROGRADE PYELOGRAM/URETERAL DOUBLE J STENT PLACEMENT;  Surgeon: Ceasar Mons, MD;  Location: [place];  Service: Urology;  Laterality: Right;  . CYSTOSCOPY WITH STENT PLACEMENT N/A 02/23/2019   Procedure: CYSTOSCOPY WITH RIGHT URETERAL STENT EXCHANGE/ LEFT URETERAL STENT PLACEMENT;  Surgeon: Ceasar Mons, MD;  [facility]: [facility];  Service: Urology;  Laterality: N/A;  .  CYSTOSCOPY WITH STENT PLACEMENT Right 06/11/2019   Procedure: CYSTOSCOPY,RIGHT RETROGRADE WITH STENT PLACEMENT;  Surgeon: Irine Seal, MD;  [facility];  Service: Urology;  Laterality: Right;  . CYSTOSCOPY WITH STENT PLACEMENT Right 03/03/2020   Procedure: CYSTOSCOPY WITH STENT PLACEMENT retroagrade pylerogram;  Surgeon: Ardis Hughs, MD;  [facility];  Service: Urology;  Laterality: Right;  . CYSTOSCOPY/RETROGRADE/URETEROSCOPY/STONE EXTRACTION WITH BASKET  Bilateral 05/01/2019   Procedure: CYSTOSCOPY/URETEROSCOPY/STONE EXTRACTION / LASER LITHOTRIPSY, BILATERAL URETEROSCOPY WITH URETERAL STONE EXTRACTION AND STENT EXCHANGE;  Surgeon: Ceasar Mons, MD;  Location: [facility];  Service: Urology;  Laterality: Bilateral;  . CYSTOSCOPY/URETEROSCOPY/HOLMIUM LASER/STENT PLACEMENT Right 06/27/2019   Procedure: CYSTOSCOPY/URETEROSCOPY/HOLMIUM LASER/STENT EXCHANGE;  Surgeon: Ceasar Mons, MD;  [facility];  Service: Urology;  Laterality: Right;  . EMBOLECTOMY Left 02/24/2017   Procedure: Left Lower Extremity Embolectomy and Angiogram.;  Surgeon: Waynetta Sandy, MD;  Location: [place];  Service: Vascular;  Laterality: Left;  . INSERTION OF ILIAC STENT  02/24/2017   Procedure: INSERTION OF Common ILIAC STENT;  Surgeon: Waynetta Sandy, MD;  Location: [place];  Service: Vascular;;  . INTRAOPERATIVE ARTERIOGRAM Left 02/25/2018   Procedure: INTRA OPERATIVE ARTERIOGRAM WITH LEFT LEG RUNOFF;  Surgeon: Conrad [place], MD;  Location: [place];  Service: Vascular;  Laterality: Left;  . LOWER EXTREMITY ANGIOGRAM Left 02/24/2017   Procedure: Aortagram, Left lower extremity Run-off;  Surgeon: Waynetta Sandy, MD;  Location: [place];  Service: Vascular;  Laterality: Left;  . PATCH ANGIOPLASTY Left 02/25/2018   Procedure: PATCH ANGIOPLASTY LEFT SUPERFICIAL FEMORAL ARTERY WITH BOVINE PATCH;  Surgeon: Conrad [place], MD;  Location: [place];  Service: Vascular;  Laterality: Left;  . PERCUTANEOUS VENOUS THROMBECTOMY,LYSIS WITH INTRAVASCULAR ULTRASOUND (IVUS) Left 05/12/2018   Procedure: MECHANICAL THROMBECTOMY LEFT LEG, BALLOON ANGIOPLASTY LEFT ANTERIOR TIBIAL ARTERY, AORTOGRAM WITH LEFT LEG RUNOFF;  Surgeon: Serafina Mitchell, MD;  Location: [place];  Service: Vascular;  Laterality: Left;  . 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 THROMBECTOMY FEMORAL ARTERY Left 02/25/2018   Procedure: LEFT ILIAC AND POPLITEAL ARTERY  THROMBECTOMY;  Surgeon: Conrad , MD;  Location: [place];  Service: Vascular;  Laterality: Left;  . TUBAL LIGATION    . WOUND DEBRIDEMENT Left 03/03/2018   Procedure: DEBRIDEMENT WOUND;  Surgeon: Angelia Mould, MD;  Location: [place];  Service: Vascular;  Laterality: Left;  . WOUND EXPLORATION Left 03/03/2018   Procedure: WOUND EXPLORATION;  Surgeon: Angelia Mould, MD;  Location: [facility];  Service: Vascular;  Laterality: Left;    Current Medications: Current Meds  Medication Sig  . acetaminophen (TYLENOL) 500 MG tablet Take 2,000 mg by mouth daily as needed (for headaches).   Marland Kitchen albuterol (PROAIR HFA) 108 (90 Base) MCG/ACT inhaler Inhale 2 puffs into the lungs every 6 (six) hours as needed for wheezing or shortness of breath.  Marland Kitchen amLODipine (NORVASC) 5 MG tablet Take 1 tablet (5 mg total) by mouth daily.  Marland Kitchen apixaban (ELIQUIS) 5 MG TABS tablet Take 1 tablet (5 mg total) by mouth 2 (two) times daily.  Marland Kitchen atorvastatin (LIPITOR) 10 MG tablet Take 1 tablet (10 mg total) by mouth daily.  Marland Kitchen buPROPion (WELLBUTRIN SR) 150 MG 12 hr tablet Take 1 tablet (150 mg total) by mouth 2 (two) times daily.  . carvedilol (COREG) 3.125 MG tablet Take 1 tablet (3.125 mg total) by mouth 2 (two) times daily.  . cefdinir (OMNICEF) 300 MG capsule Take 1 capsule (300 mg total) by  mouth daily.  . cephALEXin (KEFLEX) 500 MG capsule Take 1 capsule (500 mg total) by mouth 4 (four) times daily for 12 days.  . ferrous sulfate 325 (65 FE) MG tablet Take 1 tablet (325 mg total) by mouth daily with breakfast.  . folic acid (FOLVITE) 1 MG tablet Take 1 tablet (1 mg total) by mouth daily.  Marland Kitchen gabapentin (NEURONTIN) 400 MG capsule Take 1 capsule (400 mg total) by mouth 3 (three) times daily.  . hydrOXYzine (ATARAX/VISTARIL) 50 MG tablet Take 1 tablet (50 mg total) by mouth 3 (three) times daily as needed for itching or anxiety.  Marland Kitchen ibuprofen (ADVIL) 800 MG tablet Take 1 tablet (800 mg total) by mouth every 8 (eight) hours  as needed. (Patient taking differently: Take 800 mg by mouth every 8 (eight) hours as needed for moderate pain. )  . insulin aspart (NOVOLOG) 100 UNIT/ML injection Inject 5-15 Units into the skin 3 (three) times daily before meals.  . insulin glargine (LANTUS) 100 UNIT/ML injection Inject 0.62 mLs (62 Units total) into the skin at bedtime.  . Insulin Pen Needle 31G X 5 MM MISC Use with Lantus and Novolog injections.  . Insulin Syringe-Needle U-100 (BD INSULIN SYRINGE ULTRAFINE) 31G X 5/16" 0.5 ML MISC Use as directed  . Insulin Syringes, Disposable, U-100 1 ML MISC Use as directed  . loratadine (CLARITIN) 10 MG tablet Take 1 tablet (10 mg total) by mouth daily as needed for allergies.  . megestrol (MEGACE) 40 MG tablet Take 1 tablet (40 mg total) by mouth 2 (two) times daily. Can increase to two tablets twice a day in the event of heavy bleeding  . methocarbamol (ROBAXIN) 500 MG tablet Take 1 tablet (500 mg total) by mouth 2 (two) times daily.  . nitroGLYCERIN (NITROSTAT) 0.4 MG SL tablet Place 1 tablet (0.4 mg total) under the tongue every 5 (five) minutes as needed for chest pain.  Marland Kitchen sertraline (ZOLOFT) 50 MG tablet Take 1 tablet (50 mg total) by mouth daily. 1/2 tab daily x 2 wks then 1 tab PO daily (Patient taking differently: Take 50 mg by mouth daily. )  . sucralfate (CARAFATE) 1 GM/10ML suspension Take 10 mLs (1 g total) by mouth 2 (two) times daily with breakfast and lunch. (Patient taking differently: Take 1 g by mouth 2 (two) times daily as needed (stomach). )  . TRUEplus Lancets 28G MISC Use as directed  . vitamin B-12 (CYANOCOBALAMIN) 1000 MCG tablet Take 2 tablets (2,000 mcg total) by mouth daily.     Allergies:   Ciprofloxacin hcl, Dilaudid [hydromorphone hcl], Macrobid [nitrofurantoin macrocrystal], Other, Penicillins, Sulfa antibiotics, and Lexapro [escitalopram oxalate]   Social History   Socioeconomic History  . Marital status: Single    Spouse name: Not on file  . Number of  children: Not on file  . Years of education: Not on file  . Highest education level: Not on file  Occupational History  . Not on file  Tobacco Use  . Smoking status: Current Some Day Smoker    Packs/day: 0.30    Years: 15.00    Pack years: 4.50    Types: Cigarettes  . Smokeless tobacco: Never Used  Substance and Sexual Activity  . Alcohol use: Yes    Comment: occassionally  . Drug use: Yes    Types: Marijuana, Cocaine    Comment: once daily- last time used was night of 02/21/2019  . Sexual activity: Yes    Partners: Male    Birth control/protection: Other-see  comments    Comment: BTL  Other Topics Concern  . Not on file  Social History Narrative  . Not on file   Social Determinants of Health   Financial Resource Strain:   . Difficulty of Paying Living Expenses:   Food Insecurity:   . Worried About Charity fundraiser in the Last Year:   . Arboriculturist in the Last Year:   Transportation Needs:   . Film/video editor (Medical):   Marland Kitchen Lack of Transportation (Non-Medical):   Physical Activity:   . Days of Exercise per Week:   . Minutes of Exercise per Session:   Stress:   . Feeling of Stress :   Social Connections:   . Frequency of Communication with Friends and Family:   . Frequency of Social Gatherings with Friends and Family:   . Attends Religious Services:   . Active Member of Clubs or Organizations:   . Attends Archivist Meetings:   Marland Kitchen Marital Status:      Family History: The patient's family history includes Depression in her mother; Diabetes in her father; Heart failure in her father.  ROS:   Please see the history of present illness.    All other systems reviewed and are negative.  EKGs/Labs/Other Studies Reviewed:    The following studies were reviewed today:  EKG: Normal sinus rhythm  Recent Labs: 07/30/2019: TSH 0.983 09/23/2019: Magnesium 1.8 02/19/2020: ALT 9 03/10/2020: BUN 22; Creatinine, Ser 2.14; Hemoglobin 13.1; Platelets  398; Potassium 4.8; Sodium 133  Recent Lipid Panel    Component Value Date/Time   CHOL 164 01/21/2020 1603   TRIG 235 (H) 01/21/2020 1603   HDL 37 (L) 01/21/2020 1603   CHOLHDL 4.4 01/21/2020 1603   CHOLHDL 6.5 06/27/2018 0126   VLDL 47 (H) 06/27/2018 0126   LDLCALC 87 01/21/2020 1603    Physical Exam:    VS:  BP 138/88   Pulse 88   Ht 5\' 7"  (1.702 m)   Wt 254 lb 9.6 oz (115.5 kg)   BMI 39.88 kg/m     Wt Readings from Last 5 Encounters:  03/11/20 254 lb 9.6 oz (115.5 kg)  03/10/20 252 lb (114.3 kg)  03/03/20 252 lb (114.3 kg)  02/19/20 254 lb 4.8 oz (115.3 kg)  02/01/20 264 lb (119.7 kg)     Constitutional: No acute distress Eyes: sclera non-icteric, normal conjunctiva and lids ENMT: normal dentition, moist mucous membranes Cardiovascular: regular rhythm, normal rate, no murmurs. S1 and S2 normal. Radial pulses normal bilaterally. No jugular venous distention.  Respiratory: clear to auscultation bilaterally GI : normal bowel sounds, soft and nontender. No distention.   MSK: extremities warm, well perfused.  Trace edema.  NEURO: grossly nonfocal exam, moves all extremities. PSYCH: alert and oriented x 3, normal mood and affect.   ASSESSMENT:    1. Preoperative cardiovascular examination   2. Precordial pain   3. Systolic dysfunction without heart failure   4. Peripheral vascular disease of lower extremity ([place])   5. Thromboembolism ([place])   6. Abnormal uterine bleeding (AUB)   7. Essential hypertension   8. Ureteral stone with hydronephrosis    PLAN:    Preoperative cardiovascular examination - Plan: EKG 12-Lead, MYOCARDIAL PERFUSION IMAGING Precordial pain - Plan: EKG 12-Lead, MYOCARDIAL PERFUSION IMAGING Systolic dysfunction without heart failure  Patient has multiple risk factors for coronary artery disease, and in the setting of reduced ejection fraction and chest pain with emotional stress, would recommend excluding ischemia.  With multiple upcoming  procedures this is primarily for risk stratification, however if there are high risk features, would need to consider coronary angiography to define coronary anatomy.  She has ureteral stent procedure on June 16 which will likely require general anesthesia given the initial procedure on May 31 was performed with general anesthesia.  We will attempt to complete her stress test prior to this procedure.  She is at least intermediate risk for general anesthesia given the above-mentioned risk factors and prior history of arterial thromboembolic events and peripheral vascular stenting with reocclusion.  Further comments on risk stratification and cardiac optimization pending ischemic testing.  Total time of encounter: 35 minutes total time of encounter, including 25 minutes spent in face-to-face patient care on the date of this encounter. This time includes coordination of care and counseling regarding above mentioned problem list. Remainder of non-face-to-face time involved reviewing chart documents/testing relevant to the patient encounter and documentation in the medical record. I have independently reviewed documentation from referring provider.   Cherlynn Kaiser, MD [place]  [facility]    Medication Adjustments/Labs and Tests Ordered: Current medicines are reviewed at length with the patient today.  Concerns regarding medicines are outlined above.  Orders Placed This Encounter  Procedures  . MYOCARDIAL PERFUSION IMAGING  . EKG 12-Lead   No orders of the defined types were placed in this encounter.   Patient Instructions  .Medication Instructions:  No changes *If you need a refill on your cardiac medications before your next appointment, please call your pharmacy*   Lab Work: Not needed   Testing/Procedures: Will be schedule at [place] has requested that you have a lexiscan myoview. Please follow instruction sheet, as  given.   Follow-Up: At [facility], you and your health needs are our priority.  As part of our continuing mission to provide you with exceptional heart care, we have created designated Provider Care Teams.  These Care Teams include your primary Cardiologist (physician) and Advanced Practice Providers (APPs -  Physician Assistants and Nurse Practitioners) who all work together to provide you with the care you need, when you need it.  We recommend signing up for the patient portal called "MyChart".  Sign up information is provided on this After Visit Summary.  MyChart is used to connect with patients for Virtual Visits (Telemedicine).  Patients are able to view lab/test results, encounter notes, upcoming appointments, etc.  Non-urgent messages can be sent to your provider as well.   To learn more about what you can do with MyChart, go to [email or web address].    Your next appointment:   1 week(s)  The format for your next appointment:   In Person  Provider:   You may see   one of the following Advanced Practice Providers on your designated Care Team:    Rosaria Ferries, PA-C  Jory Sims, DNP, ANP  Cadence Kathlen Mody, NP    Other Instructions Not needed

## 2020-03-12 ENCOUNTER — Telehealth (HOSPITAL_COMMUNITY): Payer: Self-pay

## 2020-03-12 NOTE — Telephone Encounter (Signed)
Encounter complete. 

## 2020-03-13 NOTE — Progress Notes (Signed)
HEMATOLOGY/ONCOLOGY CONSULTATION NOTE  Date of Service: 03/14/2020  Patient Care Team: Ladell Pier, MD as PCP - General (Internal Medicine)  CHIEF COMPLAINTS/PURPOSE OF CONSULTATION:  Thromboembolism  HISTORY OF PRESENTING ILLNESS:  I connected with Terri Sparks on 03/14/20 at 12:00 PM EDT and verified that I am speaking with the correct person using two identifiers.   I discussed the limitations, risks, security and privacy concerns of performing an evaluation and management service by telemedicine and the availability of in-person appointments. I also discussed with the patient that there may be a patient responsible charge related to this service. The patient expressed understanding and agreed to proceed.   Other persons participating in the visit and their role in the encounter:     -Terri Sparks, Medical Scribe   Patient's location: Home  Provider's location: [name] is a wonderful 44 y.o. female who has been referred to [name] by Dr. Margaretann Loveless for evaluation and management of thromboembolism. The patient's last visit with [name] was on 02/19/20. The pt reports that she is doing well overall.  The pt reports she is good. She has had some problems with her kidneys and has to have kidney stones and stent removed on 03/19/20. She is no longer taking the Megace medication and has not been able to contact OBGYN. Pt will be holding Eliquis prior to kidney surgery.   Lab results today (03/10/20) of CBC w/diff and CMP is as follows: all values are WNL except for WBC at 13.1, Sodium at 133, Glucose at 230, BUN at 22, Creatinine at 2.14, GFR, Est Non Af Am at 27, GFR, Est AFR Am at 31 02/19/20 of dRVVT Mix at 40.3: WNL 02/19/20 of Beta-2-Glycoprotein I abs, IgG/M/A is as follows: all values are WNL 02/19/20 of Cardiolipin Antibodies, IgG, IgM, IgA is as follows: all values are WNL 02/19/20 of Lupus Anticoagulant Panel is as follows: all values are WNL  except for DRVVT at 48 02/19/20 of Antithrombin III at 128 02/19/20 of Protein C, Total at 132: WNL 02/19/20 of Protein C activity at 118: WNL 02/19/20 of Protein S Panel, Total Free, Functional Protein S is as follows: all values are WNL 02/19/20 of Prothrombin Gene Mutation at negative  02/19/20 of Factor 5 Leiden at Negative 02/19/20 of Iron and TIBC is as follows: all values are WNL 02/19/20 of Ferritin at 29: WNL  MEDICAL HISTORY:  Past Medical History:  Diagnosis Date  . Anxiety   . Depression   . Diabetes ([place])    Type 2  . GERD (gastroesophageal reflux disease)   . History of blood clots    ,DVT-left leg, and early 2000, had blood clot behind left breast  . History of kidney stones   . Hypercalcemia   . Hyperparathyroidism   . Hypertension    had been on Lisinopril and PCP took her off it 4 months ago  . Iron deficiency anemia   . Left thyroid nodule   . Mass of right ovary   . Nephrolithiasis   . PAOD (peripheral arterial occlusive disease) ([place])   . Peripheral vascular disease ([place])   . Prolonged Q-T interval on ECG 04/19/2019  . Renal calculi   . Substance abuse ([place])   . Tooth loose    top tooth from the right  is loose     SURGICAL HISTORY: Past Surgical History:  Procedure Laterality Date  . ABDOMINAL AORTOGRAM W/LOWER EXTREMITY N/A 11/14/2017   Procedure:  ABDOMINAL AORTOGRAM W/LOWER EXTREMITY;  Surgeon: Waynetta Sandy, MD;  Location: [facility] [facility];  Service: Cardiovascular;  Laterality: N/A;  . ANGIOPLASTY ILLIAC ARTERY Left 02/25/2018   Procedure: BALLOON ANGIOPLASTY LEFT ILIAC ARTERY;  Surgeon: Conrad [place], MD;  Location: [place];  Service: Vascular;  Laterality: Left;  . APPLICATION OF WOUND VAC Left 03/03/2018   Procedure: APPLICATION OF WOUND VAC;  Surgeon: Angelia Mould, MD;  Location: [place];  Service: Vascular;  Laterality: Left;  . CYSTOSCOPY W/ URETERAL STENT PLACEMENT Right 06/27/2018   Procedure: CYSTOSCOPY WITH RETROGRADE  PYELOGRAM/URETERAL DOUBLE J STENT PLACEMENT;  Surgeon: Ceasar Mons, MD;  Location: [place];  Service: Urology;  Laterality: Right;  . CYSTOSCOPY WITH STENT PLACEMENT N/A 02/23/2019   Procedure: CYSTOSCOPY WITH RIGHT URETERAL STENT EXCHANGE/ LEFT URETERAL STENT PLACEMENT;  Surgeon: Ceasar Mons, MD;  Location: [facility];  Service: Urology;  Laterality: N/A;  . CYSTOSCOPY WITH STENT PLACEMENT Right 06/11/2019   Procedure: CYSTOSCOPY,RIGHT RETROGRADE WITH STENT PLACEMENT;  Surgeon: Irine Seal, MD;  Location: [facility];  Service: Urology;  Laterality: Right;  . CYSTOSCOPY WITH STENT PLACEMENT Right 03/03/2020   Procedure: CYSTOSCOPY WITH STENT PLACEMENT retroagrade pylerogram;  Surgeon: Ardis Hughs, MD;  Location: [facility];  Service: Urology;  Laterality: Right;  . CYSTOSCOPY/RETROGRADE/URETEROSCOPY/STONE EXTRACTION WITH BASKET Bilateral 05/01/2019   Procedure: CYSTOSCOPY/URETEROSCOPY/STONE EXTRACTION / LASER LITHOTRIPSY, BILATERAL URETEROSCOPY WITH URETERAL STONE EXTRACTION AND STENT EXCHANGE;  Surgeon: Ceasar Mons, MD;  [facility];  Service: Urology;  Laterality: Bilateral;  . CYSTOSCOPY/URETEROSCOPY/HOLMIUM LASER/STENT PLACEMENT Right 06/27/2019   Procedure: CYSTOSCOPY/URETEROSCOPY/HOLMIUM LASER/STENT EXCHANGE;  Surgeon: Ceasar Mons, MD;  [facility];  Service: Urology;  Laterality: Right;  . EMBOLECTOMY Left 02/24/2017   Procedure: Left Lower Extremity Embolectomy and Angiogram.;  Surgeon: Waynetta Sandy, MD;  Location: [place];  Service: Vascular;  Laterality: Left;  . INSERTION OF ILIAC STENT  02/24/2017   Procedure: INSERTION OF Common ILIAC STENT;  Surgeon: Waynetta Sandy, MD;  Location: [place];  Service: Vascular;;  . INTRAOPERATIVE ARTERIOGRAM Left 02/25/2018   Procedure: INTRA OPERATIVE ARTERIOGRAM WITH LEFT LEG RUNOFF;  Surgeon: Conrad Stockbridge, MD;  Location: [place];  Service: Vascular;  Laterality:  Left;  . LOWER EXTREMITY ANGIOGRAM Left 02/24/2017   Procedure: Aortagram, Left lower extremity Run-off;  Surgeon: Waynetta Sandy, MD;  Location: [place];  Service: Vascular;  Laterality: Left;  . PATCH ANGIOPLASTY Left 02/25/2018   Procedure: PATCH ANGIOPLASTY LEFT SUPERFICIAL FEMORAL ARTERY WITH BOVINE PATCH;  Surgeon: Conrad Montrose, MD;  Location: [place];  Service: Vascular;  Laterality: Left;  . PERCUTANEOUS VENOUS THROMBECTOMY,LYSIS WITH INTRAVASCULAR ULTRASOUND (IVUS) Left 05/12/2018   Procedure: MECHANICAL THROMBECTOMY LEFT LEG, BALLOON ANGIOPLASTY LEFT ANTERIOR TIBIAL ARTERY, AORTOGRAM WITH LEFT LEG RUNOFF;  Surgeon: Serafina Mitchell, MD;  Location: [place];  Service: Vascular;  Laterality: Left;  . 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 THROMBECTOMY FEMORAL ARTERY Left 02/25/2018   Procedure: LEFT ILIAC AND POPLITEAL ARTERY THROMBECTOMY;  Surgeon: Conrad , MD;  Location: [place];  Service: Vascular;  Laterality: Left;  . TUBAL LIGATION    . WOUND DEBRIDEMENT Left 03/03/2018   Procedure: DEBRIDEMENT WOUND;  Surgeon: Angelia Mould, MD;  Location: [place];  Service: Vascular;  Laterality: Left;  . WOUND EXPLORATION Left 03/03/2018   Procedure: WOUND EXPLORATION;  Surgeon: Angelia Mould, MD;  Location: [place];  Service: Vascular;  Laterality: Left;    SOCIAL  HISTORY: Social History   Socioeconomic History  . Marital status: Single    Spouse name: Not on file  . Number of children: Not on file  . Years of education: Not on file  . Highest education level: Not on file  Occupational History  . Not on file  Tobacco Use  . Smoking status: Current Some Day Smoker    Packs/day: 0.30    Years: 15.00    Pack years: 4.50    Types: Cigarettes  . Smokeless tobacco: Never Used  Vaping Use  . Vaping Use: Never used  Substance and Sexual Activity  . Alcohol use: Yes    Comment: occassionally  . Drug use: Yes    Types: Marijuana, Cocaine    Comment: once daily- last  time used was night of 02/21/2019  . Sexual activity: Yes    Partners: Male    Birth control/protection: Other-see comments    Comment: BTL  Other Topics Concern  . Not on file  Social History Narrative  . Not on file   Social Determinants of Health   Financial Resource Strain:   . Difficulty of Paying Living Expenses:   Food Insecurity:   . Worried About Charity fundraiser in the Last Year:   . Arboriculturist in the Last Year:   Transportation Needs:   . Film/video editor (Medical):   Marland Kitchen Lack of Transportation (Non-Medical):   Physical Activity:   . Days of Exercise per Week:   . Minutes of Exercise per Session:   Stress:   . Feeling of Stress :   Social Connections:   . Frequency of Communication with Friends and Family:   . Frequency of Social Gatherings with Friends and Family:   . Attends Religious Services:   . Active Member of Clubs or Organizations:   . Attends Archivist Meetings:   Marland Kitchen Marital Status:   Intimate Partner Violence:   . Fear of Current or Ex-Partner:   . Emotionally Abused:   Marland Kitchen Physically Abused:   . Sexually Abused:     FAMILY HISTORY: Family History  Problem Relation Age of Onset  . Depression Mother   . Diabetes Father   . Heart failure Father     ALLERGIES:  is allergic to ciprofloxacin hcl, dilaudid [hydromorphone hcl], macrobid [nitrofurantoin macrocrystal], other, penicillins, sulfa antibiotics, and lexapro [escitalopram oxalate].  MEDICATIONS:  Current Outpatient Medications  Medication Sig Dispense Refill  . acetaminophen (TYLENOL) 500 MG tablet Take 2,000 mg by mouth daily as needed (for headaches).     Marland Kitchen albuterol (PROAIR HFA) 108 (90 Base) MCG/ACT inhaler Inhale 2 puffs into the lungs every 6 (six) hours as needed for wheezing or shortness of breath. 18 g 2  . amLODipine (NORVASC) 5 MG tablet Take 1 tablet (5 mg total) by mouth daily. 30 tablet 11  . apixaban (ELIQUIS) 5 MG TABS tablet Take 1 tablet (5 mg total)  by mouth 2 (two) times daily. 60 tablet 2  . atorvastatin (LIPITOR) 10 MG tablet Take 1 tablet (10 mg total) by mouth daily. 30 tablet 3  . buPROPion (WELLBUTRIN SR) 150 MG 12 hr tablet Take 1 tablet (150 mg total) by mouth 2 (two) times daily. 60 tablet 11  . carvedilol (COREG) 3.125 MG tablet Take 1 tablet (3.125 mg total) by mouth 2 (two) times daily. 60 tablet 6  . cefdinir (OMNICEF) 300 MG capsule Take 1 capsule (300 mg total) by mouth daily. 12 capsule 0  .  cephALEXin (KEFLEX) 500 MG capsule Take 1 capsule (500 mg total) by mouth 4 (four) times daily for 12 days. 48 capsule 0  . ferrous sulfate 325 (65 FE) MG tablet Take 1 tablet (325 mg total) by mouth daily with breakfast. 30 tablet 3  . folic acid (FOLVITE) 1 MG tablet Take 1 tablet (1 mg total) by mouth daily. 30 tablet 3  . gabapentin (NEURONTIN) 400 MG capsule Take 1 capsule (400 mg total) by mouth 3 (three) times daily. 90 capsule 3  . hydrOXYzine (ATARAX/VISTARIL) 50 MG tablet Take 1 tablet (50 mg total) by mouth 3 (three) times daily as needed for itching or anxiety. 90 tablet 4  . ibuprofen (ADVIL) 800 MG tablet Take 1 tablet (800 mg total) by mouth every 8 (eight) hours as needed. (Patient taking differently: Take 800 mg by mouth every 8 (eight) hours as needed for moderate pain. ) 60 tablet 1  . insulin aspart (NOVOLOG) 100 UNIT/ML injection Inject 5-15 Units into the skin 3 (three) times daily before meals. 10 mL 3  . insulin glargine (LANTUS) 100 UNIT/ML injection Inject 0.62 mLs (62 Units total) into the skin at bedtime. 10 mL 2  . Insulin Pen Needle 31G X 5 MM MISC Use with Lantus and Novolog injections. 100 each 11  . Insulin Syringe-Needle U-100 (BD INSULIN SYRINGE ULTRAFINE) 31G X 5/16" 0.5 ML MISC Use as directed 100 each 3  . Insulin Syringes, Disposable, U-100 1 ML MISC Use as directed 100 each 0  . loratadine (CLARITIN) 10 MG tablet Take 1 tablet (10 mg total) by mouth daily as needed for allergies. 30 tablet 2  .  megestrol (MEGACE) 40 MG tablet Take 1 tablet (40 mg total) by mouth 2 (two) times daily. Can increase to two tablets twice a day in the event of heavy bleeding 60 tablet 5  . methocarbamol (ROBAXIN) 500 MG tablet Take 1 tablet (500 mg total) by mouth 2 (two) times daily. 20 tablet 0  . nitroGLYCERIN (NITROSTAT) 0.4 MG SL tablet Place 1 tablet (0.4 mg total) under the tongue every 5 (five) minutes as needed for chest pain. 25 tablet 3  . sertraline (ZOLOFT) 50 MG tablet Take 1 tablet (50 mg total) by mouth daily. 1/2 tab daily x 2 wks then 1 tab PO daily (Patient taking differently: Take 50 mg by mouth daily. ) 30 tablet 4  . sucralfate (CARAFATE) 1 GM/10ML suspension Take 10 mLs (1 g total) by mouth 2 (two) times daily with breakfast and lunch. (Patient taking differently: Take 1 g by mouth 2 (two) times daily as needed (stomach). ) 200 mL 1  . TRUEplus Lancets 28G MISC Use as directed 100 each 4  . vitamin B-12 (CYANOCOBALAMIN) 1000 MCG tablet Take 2 tablets (2,000 mcg total) by mouth daily. 30 tablet 3   No current facility-administered medications for this visit.    REVIEW OF SYSTEMS:   A 10+ POINT REVIEW OF SYSTEMS WAS OBTAINED including neurology, dermatology, psychiatry, cardiac, respiratory, lymph, extremities, GI, GU, Musculoskeletal, constitutional, breasts, reproductive, HEENT.  All pertinent positives are noted in the HPI.  All others are negative.   PHYSICAL EXAMINATION: ECOG PERFORMANCE STATUS: 1 - Symptomatic but completely ambulatory  . There were no vitals filed for this visit. There were no vitals filed for this visit. .There is no height or weight on file to calculate BMI.  Tele-Health Visit   LABORATORY DATA:  I have reviewed the data as listed  . CBC Latest Ref Rng &  Units 03/10/2020 03/04/2020 03/03/2020  WBC 4.0 - 10.5 K/uL 13.1(H) 19.4(H) 29.8(H)  Hemoglobin 12.0 - 15.0 g/dL 13.1 12.5 15.3(H)  Hematocrit 36 - 46 % 41.1 39.6 46.2(H)  Platelets 150 - 400 K/uL 398 268  336    . CMP Latest Ref Rng & Units 03/10/2020 03/04/2020 03/04/2020  Glucose 70 - 99 mg/dL 230(H) - 172(H)  BUN 6 - 20 mg/dL 22(H) - 24(H)  Creatinine 0.44 - 1.00 mg/dL 2.14(H) - 2.29(H)  Sodium 135 - 145 mmol/L 133(L) - 138  Potassium 3.5 - 5.1 mmol/L 4.8 4.3 5.3(H)  Chloride 98 - 111 mmol/L 102 - 109  CO2 22 - 32 mmol/L 25 - 21(L)  Calcium 8.9 - 10.3 mg/dL 9.5 - 9.1  Total Protein 6.5 - 8.1 g/dL - - -  Total Bilirubin 0.3 - 1.2 mg/dL - - -  Alkaline Phos 38 - 126 U/L - - -  AST 15 - 41 U/L - - -  ALT 0 - 44 U/L - - -     RADIOGRAPHIC STUDIES: I have personally reviewed the radiological images as listed and agreed with the findings in the report. DG C-Arm 1-60 Min-No Report  Result Date: 03/03/2020 Fluoroscopy was utilized by the requesting physician.  No radiographic interpretation.   CT Renal Stone Study  Result Date: 03/10/2020 CLINICAL DATA:  Bilateral flank and back pain. History of kidney stones. Recent right-sided stent placement. EXAM: CT ABDOMEN AND PELVIS WITHOUT CONTRAST TECHNIQUE: Multidetector CT imaging of the abdomen and pelvis was performed following the standard protocol without IV contrast. COMPARISON:  CT 1 week ago 03/03/2020 FINDINGS: Lower chest: Lung bases are clear. Hepatobiliary: No focal liver abnormality is seen. No gallstones, gallbladder wall thickening, or biliary dilatation. Pancreas: No ductal dilatation or inflammation. Spleen: Normal in size without focal abnormality. Adrenals/Urinary Tract: Normal right adrenal gland. Stable left adrenal adenoma. Right ureteral stent in place with near complete resolution of right hydronephrosis. Trace residual fullness of the right renal pelvis. Tiny stone or stone fragment in the distal right ureter, series 5, image 128 insert of prior ureteral stone. Improvement in perinephric edema which is near completely resolved. Nonobstructing stones in the lower right kidney largest measuring 7 mm. Previous air in the right renal  collecting system has resolved. Multifocal left renal cortical scarring and thinning. Nonobstructing stones in the left kidney. No left hydronephrosis. No left perinephric edema. No left ureteral calculi. Previous air in the left renal collecting system has resolved. No stones in the urinary bladder which is partially distended. Stomach/Bowel: Ingested material distends the stomach, as well as the distal esophagus. No gastric wall thickening. No small bowel obstruction or inflammation. Normal appendix. Moderate volume of stool throughout the colon. No colonic wall thickening or inflammation. Vascular/Lymphatic: Aorto bi-iliac atherosclerosis. Left common iliac artery stent. Surgical clips adjacent to the left femoral vessels. No adenopathy. Reproductive: Uterus and bilateral adnexa are unremarkable. Other: No free air, free fluid, or intra-abdominal fluid collection. Musculoskeletal: There are no acute or suspicious osseous abnormalities. IMPRESSION: 1. Right ureteral stent in place with near complete resolution of right hydronephrosis and perinephric edema over the past week. Tiny stone or stone fragment in the distal right ureter at the site of prior ureteral stone. 2. Bilateral nonobstructing renal calculi. Left renal cortical scarring and thinning. No left hydronephrosis or obstructive uropathy. 3. Distended stomach with ingested contents, as well as distending the distal esophagus. Query gastroparesis and reflux. 4. Stable left adrenal adenoma. Aortic Atherosclerosis (ICD10-I70.0). Electronically Signed  By: Keith Rake M.D.   On: 03/10/2020 02:51   CT Renal Stone Study  Result Date: 03/03/2020 CLINICAL DATA:  Right flank pain, vomiting, fever. EXAM: CT ABDOMEN AND PELVIS WITHOUT CONTRAST TECHNIQUE: Multidetector CT imaging of the abdomen and pelvis was performed following the standard protocol without IV contrast. COMPARISON:  09/22/2019 CT abdomen/pelvis. FINDINGS: Lower chest: No significant  pulmonary nodules or acute consolidative airspace disease. Hepatobiliary: Normal liver size. No liver mass. Normal gallbladder with no radiopaque cholelithiasis. No biliary ductal dilatation. Pancreas: Normal, with no mass or duct dilation. Spleen: Normal size. No mass. Adrenals/Urinary Tract: Normal right adrenal. Stable left adrenal 2.4 cm nodule with density 5 HU, compatible with a benign adenoma. Obstructing 4 mm right ureterovesical junction stone with mild to moderate right hydroureteronephrosis. Additional nonobstructing 5 mm and 3 mm lower right renal stones. Extensive right perinephric fat stranding and ill-defined fluid. Multiple nonobstructing left renal stones, largest 7 mm in the interpolar left kidney. Asymmetric moderate left renal cortical scarring. No left hydronephrosis. No contour deforming renal masses. Normal caliber left ureter. No additional ureteral stones. Nonspecific mild diffuse right renal collecting system and right ureter urothelial wall thickening. Scattered gas in the left greater the right renal collecting systems. Small amount of gas in the nondependent bladder. Otherwise normal bladder. Stomach/Bowel: Small hiatal hernia. Otherwise normal nondistended stomach. Normal caliber small bowel with no small bowel wall thickening. Normal appendix. Normal large bowel with no diverticulosis, large bowel wall thickening or pericolonic fat stranding. Vascular/Lymphatic: Atherosclerotic nonaneurysmal abdominal aorta. Left common iliac artery stent. No pathologically enlarged lymph nodes in the abdomen or pelvis. Reproductive: Grossly normal uterus.  No adnexal mass. Other: No pneumoperitoneum, ascites or focal fluid collection. Musculoskeletal: No aggressive appearing focal osseous lesions. Mild thoracolumbar spondylosis. IMPRESSION: 1. Obstructing 4 mm right UVJ stone with mild to moderate right hydroureteronephrosis. Additional nonobstructing stones in both kidneys. 2. Nonspecific scattered  gas in the left greater the right renal collecting systems and in the nondependent bladder. Findings are nonspecific and could be due to recent bladder instrumentation or gas-forming infection. 3. Nonspecific diffuse mild urothelial wall thickening in the right renal collecting system and right ureter, cannot exclude superimposed infection. 4. Asymmetric moderate left renal cortical scarring. 5. Small hiatal hernia. 6. Stable left adrenal adenoma. 7. Aortic Atherosclerosis (ICD10-I70.0). Electronically Signed   By: Ilona Sorrel M.D.   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 Remote h/o of LUE ?DVT - record related to this are not available.  triggering events - smoking, ?cocaine use  PLAN -Discussed pt labwork today, 03/10/20;  of CBC w/diff and CMP is as follows: all values are WNL except for WBC at 13.1, Sodium at 133, Glucose at 230, BUN at 22, Creatinine at 2.14, GFR, Est Non Af Am at 27, GFR, Est AFR Am at 31 -Discussed 02/19/20 of dRVVT Mix at 40.3: WNL -Discussed 02/19/20 of Beta-2-Glycoprotein I abs, IgG/M/A is as follows: all values are WNL -Discussed 02/19/20 of Cardiolipin Antibodies, IgG, IgM, IgA is as follows: all values are WNL -Discussed 02/19/20 of Lupus Anticoagulant Panel is as follows: all values are WNL except for DRVVT at 48 -Discussed 02/19/20 of Antithrombin III at 128 -Discussed 02/19/20 of Protein C, Total at 132: WNL -Discussed 02/19/20 of Protein C activity at 118: WNL -Discussed 02/19/20 of Protein S Panel, Total Free,  Functional Protein S is as follows: all values are WNL -Discussed 02/19/20 of Prothrombin Gene Mutation at negative  -Discussed 02/19/20 of Factor 5 Leiden at Negative -Discussed 02/19/20 of Iron and TIBC is as follows: all values are WNL -Discussed 02/19/20 of Ferritin at 29: WNL -Advised no acquired or inherited  risk factors  -Advised to continue to control additional risk factors  -Advised on CKD possibility of not taking Eliquis and changing to different blood thinners -continue to monitor kidney function  -Advised pt that Megace will increase her risk of blood clots. Recommend she f/u with OBGYN for other menorrhagia management options.  -Advised pt that a hysterectomy would be an independent risk factor for blood clots. Pt will have to hold anticoagulation prior to and after surgery.  -Advised pt that smoking and cocaine use will be ongoing risk factors. Recommend complete cessation.  -Would not recommend pt receive elective surgery until all risk factors are addressed.  -Recommend pt f/u with Dr. Margaretann Loveless for Cardiac clearance for surgery  -Continue 5 mg Eliquis BID -Recommends PCP consider alternate anticoagualtion if GFRless than 25 -Recommends f/u with PCP about kidney function and Eliquis use  -Recommends holding Eliquis 72 hours prior to surgery  -Will see back if needed   FOLLOW UP: RTC with Dr Irene Limbo as needed   The total time spent in the appt was37minutes and more than 50% was on counseling and direct patient cares.  All of the patient's questions were answered with apparent satisfaction. The patient knows to call the clinic with any problems, questions or concerns.  Sullivan Lone MD MS AAHIVMS [facility] Hematology/Oncology Physician [facility]  (Office):       [phone] (Work cell):  [phone] (Fax):           [phone]  03/14/2020 1:25 PM  I, Terri Sparks am acting as a Education administrator for Dr. Sullivan Lone.   .I have reviewed the above documentation for accuracy and completeness, and I agree with the above. Brunetta Genera MD

## 2020-03-14 ENCOUNTER — Other Ambulatory Visit: Payer: Self-pay

## 2020-03-14 ENCOUNTER — Inpatient Hospital Stay: Payer: Medicaid Other | Attending: Hematology | Admitting: Hematology

## 2020-03-14 ENCOUNTER — Ambulatory Visit (HOSPITAL_COMMUNITY)
Admission: RE | Admit: 2020-03-14 | Payer: Medicaid Other | Source: Ambulatory Visit | Attending: Internal Medicine | Admitting: Internal Medicine

## 2020-03-14 DIAGNOSIS — D6859 Other primary thrombophilia: Secondary | ICD-10-CM | POA: Diagnosis not present

## 2020-03-14 DIAGNOSIS — I749 Embolism and thrombosis of unspecified artery: Secondary | ICD-10-CM | POA: Diagnosis not present

## 2020-03-14 NOTE — Progress Notes (Signed)
Please place surgery orders. Pt scheduled for her PAT on 03-17-20

## 2020-03-14 NOTE — Progress Notes (Addendum)
PCP - Ladell Pier, MD  Cardiologist - Cherlynn Kaiser, MD LOV for clearance dated 03-11-20, w/ recommendatein for a stress test. Pt was a no- show for scheduled stress test on 03-14-20  PPM/ICD -  Device Orders -  Rep Notified -   Chest x-ray -  EKG - 03-11-20 Stress Test -  ECHO - 09-23-19 Cardiac Cath -   Sleep Study -  CPAP -    CBC, BMP date 03-10-20  HGA1C 8.4 (Epic) 01-21-20  Fasting Blood Sugar - 130-140  Checks Blood Sugar _2  times a day  Blood Thinner Instructions: Eliquis (Last dose 03-15-20) Aspirin Instructions:  ERAS Protcol - PRE-SURGERY Ensure or G2-   COVID TEST-   No Covid Vaccinations   Anesthesia review:   Patient denies shortness of breath, fever, cough and chest pain at PAT appointment   All instructions explained to the patient, with a verbal understanding of the material. Patient agrees to go over the instructions while at home for a better understanding. Patient also instructed to self quarantine after being tested for COVID-19. The opportunity to ask questions was provided.

## 2020-03-14 NOTE — Patient Instructions (Signed)
DUE TO COVID-19 ONLY ONE VISITOR IS ALLOWED TO COME WITH YOU AND STAY IN THE WAITING ROOM ONLY DURING PRE OP AND PROCEDURE DAY OF SURGERY. THE 1 VISITOR MAY VISIT WITH YOU AFTER SURGERY IN YOUR PRIVATE ROOM DURING VISITING HOURS ONLY!  YOU NEED TO HAVE A COVID 19 TEST ON 03-15-20 @ THIS TEST MUST BE DONE BEFORE SURGERY, COME  [address], [address] [place] , [address].  ([place]) ONCE YOUR COVID TEST IS COMPLETED, PLEASE BEGIN THE QUARANTINE INSTRUCTIONS AS OUTLINED IN YOUR HANDOUT.                Aluel VALLERI HENDRICKSEN  03/14/2020   Your procedure is scheduled on: 03-19-20   Report to [facility] Main  Entrance    Report to Admitting at 6:30 AM     Call this number if you have problems the morning of surgery [phone]    Remember: Do not eat food or drink liquids :After Midnight.     Take these medicines the morning of surgery with A SIP OF WATER: Amlodipine (Norvasc), Atorvastatin (Lipitor), Bupropion (Wellbutrin), Carvedilol (Coreg), Gabapentin (Neurontin), Loratadine (Claritin), and Sertraline (Zoloft)   BRUSH YOUR TEETH MORNING OF SURGERY AND RINSE YOUR MOUTH OUT, NO CHEWING GUM CANDY OR MINTS.   DO NOT TAKE ANY DIABETIC MEDICATIONS DAY OF YOUR SURGERY                               You may not have any metal on your body including hair pins and              piercings    Do not wear jewelry, make-up, lotions, powders or perfumes, deodorant              Do not wear nail polish on your fingernails.  Do not shave  48 hours prior to surgery.     Do not bring valuables to the hospital. [place].  Contacts, dentures or bridgework may not be worn into surgery.      Patients discharged the day of surgery will not be allowed to drive home. IF YOU ARE HAVING SURGERY AND GOING HOME THE SAME DAY, YOU MUST HAVE AN ADULT TO DRIVE YOU HOME AND BE WITH YOU FOR 24 HOURS. YOU MAY GO HOME BY TAXI OR UBER OR ORTHERWISE, BUT  AN ADULT MUST ACCOMPANY YOU HOME AND STAY WITH YOU FOR 24 HOURS.  Name and phone number of your driver:  Special Instructions: N/A              Please read over the following fact sheets you were given: _____________________________________________________________________  How to Manage Your Diabetes Before and After Surgery  Why is it important to control my blood sugar before and after surgery? . Improving blood sugar levels before and after surgery helps healing and can limit problems. . A way of improving blood sugar control is eating a healthy diet by: o  Eating less sugar and carbohydrates o  Increasing activity/exercise o  Talking with your doctor about reaching your blood sugar goals . High blood sugars (greater than 180 mg/dL) can raise your risk of infections and slow your recovery, so you will need to focus on controlling your diabetes during the weeks before surgery. . Make sure that the doctor who takes care of  your diabetes knows about your planned surgery including the date and location.  How do I manage my blood sugar before surgery? . Check your blood sugar at least 4 times a day, starting 2 days before surgery, to make sure that the level is not too high or low. o Check your blood sugar the morning of your surgery when you wake up and every 2 hours until you get to the Short Stay unit. . If your blood sugar is less than 70 mg/dL, you will need to treat for low blood sugar: o Do not take insulin. o Treat a low blood sugar (less than 70 mg/dL) with  cup of clear juice (cranberry or apple), 4 glucose tablets, OR glucose gel. o Recheck blood sugar in 15 minutes after treatment (to make sure it is greater than 70 mg/dL). If your blood sugar is not greater than 70 mg/dL on recheck, call [phone] for further instructions. . Report your blood sugar to the short stay nurse when you get to Short Stay.  . If you are admitted to the hospital after surgery: o Your blood sugar  will be checked by the staff and you will probably be given insulin after surgery (instead of oral diabetes medicines) to make sure you have good blood sugar levels. o The goal for blood sugar control after surgery is 80-180 mg/dL.   WHAT DO I DO ABOUT MY DIABETES MEDICATION?  Marland Kitchen Do not take oral diabetes medicines (pills) the morning of surgery.       THE DAY BEFORE SURGERY TAKE YOUR USUAL SCHEDULED 5-15 [facility]  INSULIN BEFORE MEALS  . THE NIGHT BEFORE SURGERY, take only 31 units of Lantus       insulin.      . If your CBG is greater than 220 mg/dL, you may take  of your sliding scale  . (correction) dose of insulin.               [facility] - Preparing for Surgery Before surgery, you can play an important role.  Because skin is not sterile, your skin needs to be as free of germs as possible.  You can reduce the number of germs on your skin by washing with CHG (chlorahexidine gluconate) soap before surgery.  CHG is an antiseptic cleaner which kills germs and bonds with the skin to continue killing germs even after washing. Please DO NOT use if you have an allergy to CHG or antibacterial soaps.  If your skin becomes reddened/irritated stop using the CHG and inform your nurse when you arrive at Short Stay. Do not shave (including legs and underarms) for at least 48 hours prior to the first CHG shower.  You may shave your face/neck. Please follow these instructions carefully:  1.  Shower with CHG Soap the night before surgery and the  morning of Surgery.  2.  If you choose to wash your hair, wash your hair first as usual with your  normal  shampoo.  3.  After you shampoo, rinse your hair and body thoroughly to remove the  shampoo.                           4.  Use CHG as you would any other liquid soap.  You can apply chg directly  to the skin and wash  Gently with a scrungie or clean washcloth.  5.  Apply the CHG Soap to your body ONLY FROM THE NECK DOWN.   Do  not use on face/ open                           Wound or open sores. Avoid contact with eyes, ears mouth and genitals (private parts).                       Wash face,  Genitals (private parts) with your normal soap.             6.  Wash thoroughly, paying special attention to the area where your surgery  will be performed.  7.  Thoroughly rinse your body with warm water from the neck down.  8.  DO NOT shower/wash with your normal soap after using and rinsing off  the CHG Soap.                9.  Pat yourself dry with a clean towel.            10.  Wear clean pajamas.            11.  Place clean sheets on your bed the night of your first shower and do not  sleep with pets. Day of Surgery : Do not apply any lotions/deodorants the morning of surgery.  Please wear clean clothes to the hospital/surgery center.  FAILURE TO FOLLOW THESE INSTRUCTIONS MAY RESULT IN THE CANCELLATION OF YOUR SURGERY PATIENT SIGNATURE_________________________________  NURSE SIGNATURE__________________________________  ________________________________________________________________________

## 2020-03-15 ENCOUNTER — Other Ambulatory Visit (HOSPITAL_COMMUNITY): Payer: Medicaid Other

## 2020-03-17 ENCOUNTER — Other Ambulatory Visit: Payer: Self-pay

## 2020-03-17 ENCOUNTER — Encounter (HOSPITAL_COMMUNITY): Payer: Self-pay

## 2020-03-17 ENCOUNTER — Other Ambulatory Visit (HOSPITAL_COMMUNITY): Payer: Medicaid Other

## 2020-03-17 ENCOUNTER — Encounter (HOSPITAL_COMMUNITY)
Admission: RE | Admit: 2020-03-17 | Discharge: 2020-03-17 | Disposition: A | Discharge status: Home / Self Care | Payer: Medicaid Other | Source: Ambulatory Visit | Attending: Urology | Admitting: Urology

## 2020-03-17 ENCOUNTER — Encounter (HOSPITAL_COMMUNITY): Admission: RE | Admit: 2020-03-17 | Payer: Medicaid Other | Source: Ambulatory Visit

## 2020-03-18 ENCOUNTER — Ambulatory Visit (HOSPITAL_COMMUNITY): Payer: Medicaid Other

## 2020-03-18 ENCOUNTER — Other Ambulatory Visit: Payer: Self-pay | Admitting: Urology

## 2020-03-18 ENCOUNTER — Telehealth: Payer: Self-pay | Admitting: Cardiology

## 2020-03-18 MED ORDER — BUPIVACAINE HCL (PF) 0.5 % IJ SOLN
INTRAMUSCULAR | Status: AC
  Filled 2020-03-18: qty 30

## 2020-03-18 NOTE — Telephone Encounter (Signed)
Pat @ Dr Herrick's office notified that stress test was rescheduled. Will await test results.

## 2020-03-18 NOTE — Telephone Encounter (Signed)
Follow Up:   Terri Sparks from Dr [facility] Meckel called. She says she needs this pt clearance asap please. Pt is scheduled for surgery tomorrow. Please fax to [phone].

## 2020-03-18 NOTE — Telephone Encounter (Signed)
Lm2cb for [facility] @ Dr Herrick's office. Pt no-showed for stress test. She has been re-schedueld for 2 day stress test 6/23-24/2021 7:45 AM

## 2020-03-21 ENCOUNTER — Telehealth (HOSPITAL_COMMUNITY): Payer: Self-pay

## 2020-03-21 NOTE — Telephone Encounter (Signed)
Encounter complete. 

## 2020-03-24 ENCOUNTER — Other Ambulatory Visit: Payer: Self-pay

## 2020-03-24 ENCOUNTER — Encounter: Payer: Self-pay | Admitting: Internal Medicine

## 2020-03-24 ENCOUNTER — Other Ambulatory Visit: Payer: Self-pay | Admitting: Internal Medicine

## 2020-03-24 ENCOUNTER — Ambulatory Visit: Payer: Medicaid Other | Attending: Internal Medicine | Admitting: Internal Medicine

## 2020-03-24 VITALS — BP 145/96 | HR 102 | Temp 96.3°F | Resp 16 | Wt 253.2 lb

## 2020-03-24 DIAGNOSIS — E1129 Type 2 diabetes mellitus with other diabetic kidney complication: Secondary | ICD-10-CM

## 2020-03-24 DIAGNOSIS — I10 Essential (primary) hypertension: Secondary | ICD-10-CM | POA: Diagnosis not present

## 2020-03-24 DIAGNOSIS — Z818 Family history of other mental and behavioral disorders: Secondary | ICD-10-CM | POA: Diagnosis not present

## 2020-03-24 DIAGNOSIS — F17210 Nicotine dependence, cigarettes, uncomplicated: Secondary | ICD-10-CM | POA: Diagnosis not present

## 2020-03-24 DIAGNOSIS — F329 Major depressive disorder, single episode, unspecified: Secondary | ICD-10-CM | POA: Insufficient documentation

## 2020-03-24 DIAGNOSIS — E1151 Type 2 diabetes mellitus with diabetic peripheral angiopathy without gangrene: Secondary | ICD-10-CM | POA: Insufficient documentation

## 2020-03-24 DIAGNOSIS — Z7901 Long term (current) use of anticoagulants: Secondary | ICD-10-CM | POA: Insufficient documentation

## 2020-03-24 DIAGNOSIS — Z8249 Family history of ischemic heart disease and other diseases of the circulatory system: Secondary | ICD-10-CM | POA: Insufficient documentation

## 2020-03-24 DIAGNOSIS — F17200 Nicotine dependence, unspecified, uncomplicated: Secondary | ICD-10-CM | POA: Diagnosis not present

## 2020-03-24 DIAGNOSIS — F1910 Other psychoactive substance abuse, uncomplicated: Secondary | ICD-10-CM | POA: Diagnosis not present

## 2020-03-24 DIAGNOSIS — I129 Hypertensive chronic kidney disease with stage 1 through stage 4 chronic kidney disease, or unspecified chronic kidney disease: Secondary | ICD-10-CM | POA: Diagnosis not present

## 2020-03-24 DIAGNOSIS — F419 Anxiety disorder, unspecified: Secondary | ICD-10-CM | POA: Insufficient documentation

## 2020-03-24 DIAGNOSIS — Z794 Long term (current) use of insulin: Secondary | ICD-10-CM

## 2020-03-24 DIAGNOSIS — Z87442 Personal history of urinary calculi: Secondary | ICD-10-CM | POA: Diagnosis not present

## 2020-03-24 DIAGNOSIS — N938 Other specified abnormal uterine and vaginal bleeding: Secondary | ICD-10-CM

## 2020-03-24 DIAGNOSIS — Z888 Allergy status to other drugs, medicaments and biological substances status: Secondary | ICD-10-CM | POA: Diagnosis not present

## 2020-03-24 DIAGNOSIS — E1122 Type 2 diabetes mellitus with diabetic chronic kidney disease: Secondary | ICD-10-CM | POA: Diagnosis not present

## 2020-03-24 DIAGNOSIS — Z79899 Other long term (current) drug therapy: Secondary | ICD-10-CM | POA: Diagnosis not present

## 2020-03-24 DIAGNOSIS — N184 Chronic kidney disease, stage 4 (severe): Secondary | ICD-10-CM

## 2020-03-24 DIAGNOSIS — Z885 Allergy status to narcotic agent status: Secondary | ICD-10-CM | POA: Diagnosis not present

## 2020-03-24 DIAGNOSIS — Z880 Allergy status to penicillin: Secondary | ICD-10-CM | POA: Diagnosis not present

## 2020-03-24 DIAGNOSIS — E1140 Type 2 diabetes mellitus with diabetic neuropathy, unspecified: Secondary | ICD-10-CM | POA: Diagnosis not present

## 2020-03-24 DIAGNOSIS — R809 Proteinuria, unspecified: Secondary | ICD-10-CM | POA: Diagnosis not present

## 2020-03-24 DIAGNOSIS — Z882 Allergy status to sulfonamides status: Secondary | ICD-10-CM | POA: Insufficient documentation

## 2020-03-24 DIAGNOSIS — Z95820 Peripheral vascular angioplasty status with implants and grafts: Secondary | ICD-10-CM | POA: Insufficient documentation

## 2020-03-24 DIAGNOSIS — Z833 Family history of diabetes mellitus: Secondary | ICD-10-CM | POA: Insufficient documentation

## 2020-03-24 DIAGNOSIS — Z86718 Personal history of other venous thrombosis and embolism: Secondary | ICD-10-CM | POA: Diagnosis not present

## 2020-03-24 DIAGNOSIS — Z881 Allergy status to other antibiotic agents status: Secondary | ICD-10-CM | POA: Diagnosis not present

## 2020-03-24 LAB — GLUCOSE, POCT (MANUAL RESULT ENTRY): POC Glucose: 193 mg/dl — AB (ref 70–99)

## 2020-03-24 MED ORDER — HYDROXYZINE HCL 50 MG PO TABS: 50.0000 mg | ORAL_TABLET | Freq: Three times a day (TID) | ORAL | 4 refills | Status: DC | PRN

## 2020-03-24 MED ORDER — NICOTINE 14 MG/24HR TD PT24: 14.0000 mg | MEDICATED_PATCH | Freq: Every day | TRANSDERMAL | 0 refills | Status: DC

## 2020-03-24 MED ORDER — INSULIN GLARGINE 100 UNIT/ML ~~LOC~~ SOLN: 65.0000 [IU] | Freq: Every day | SUBCUTANEOUS | 2 refills | Status: DC

## 2020-03-24 NOTE — Patient Instructions (Signed)
Increase Lantus insulin to 65 units daily.  We will schedule you to come back with [facility] after July 1 to be switch from Eliquis to Coumadin.  Please remember to return to the lab later this week for blood test to recheck your kidney function.

## 2020-03-24 NOTE — Progress Notes (Signed)
Pt states her pain is coming from her kidneys

## 2020-03-24 NOTE — Progress Notes (Signed)
Patient ID: Terri Sparks, female    DOB: May 20, 1976  MRN: [id number]  CC: Diabetes and Hypertension   Subjective: Terri Sparks is a 44 y.o. female who presents for f/u visit Her concerns today include:  Hx ofDM type2 with macroalbumin, CKD 3,HTN,systolic dysf without heart failure, tob dep,PVDwith hx LLEacute limb ischemia requiring stent to the iliac artery and embolectomy followed by Dr. Zenia Resides vascular surgery, hyperparathyroidism s/p resection of parathryroid adenoma 2010, GERD,IDA,B12 def/folate defanxiety/dep, DUB, subst abuse (cocaine/marijuana).  I last saw the patient in April of this year for preoperative evaluation.  Referred to cardiologist, Dr. Margaretann Loveless, due to echo showing reduce EF.  ST recommended and is scheduled for later this wk.  She also had patient follow-up with Dr. Irene Limbo whose work-up for inherited hypercoagulable state was negative.  He recommended that patient discontinue Megace as it can increase risk for blood clots.  He also recommended switching from Eliquis to a different anticoagulation given her kidney function. She has stopped the Megace. Last vaginal bleeding was before her recent hospitalization.    She was hospitalized 5/31-03/04/2020 with urosepsis with right kidney stone and hydronephrosis. She was suppose to have extraction of stone and stent on 03/19/2020 but had to be reschedule for July 1st.  Tob Dep: smoking 5-10 cig a day.  Was 3 pks/day. "I'm really trying."  -Urine drug screen positive for cocaine on recent hospitalization.  Patient states that she is severely trying to get off street drugs.  She has not used cocaine since discharge from the hospital but admits that she still uses marijuana.  HTN: Blood pressure noted to be elevated today.  Reports that she has not taken her medicines as yet today.  Supposed to be on Norvasc, carvedilol.  She is trying to limit salt in the foods.  DM: checking BS twice a day before meals.  Staying below  200.  Morning BS 150-173   On SSI Novolog and Lantus.  Lantus dose was decreased to 62 units upon discharge from the hospital.   Results for orders placed or performed in visit on 03/24/20  POCT glucose (manual entry)  Result Value Ref Range   POC Glucose 193 (A) 70 - 99 mg/dl   Lab Results  Component Value Date   HGBA1C 8.4 (H) 01/21/2020      Patient Active Problem List   Diagnosis Date Noted  . Sepsis ([place]) 03/03/2020  . Homeless 11/13/2019  . Chest pain at rest 09/24/2019  . Gastritis 09/24/2019  . Sinus tachycardia 09/24/2019  . Sepsis secondary to UTI ([place]) 09/23/2019  . Polysubstance abuse ([place]) 09/23/2019  . Atypical chest pain   . Stage 3b chronic kidney disease 07/30/2019  . Iron deficiency anemia due to chronic blood loss 06/12/2019  . Vitamin B12 deficiency 06/12/2019  . Folate deficiency 06/12/2019  . Constipation 06/12/2019  . Prolonged QT interval   . Ureteral stone with hydronephrosis 06/11/2019  . Acute renal failure superimposed on stage 3 chronic kidney disease ([place]) 06/26/2018  . Right ureteral stone 06/26/2018  . Essential hypertension 06/26/2018  . Subtherapeutic international normalized ratio (INR) 06/26/2018  . Adrenal nodule ([place]) 06/21/2018  . Lung nodules 06/21/2018  . Abnormal uterine bleeding (AUB) 05/14/2018  . Hemorrhagic cyst of right ovary 05/14/2018  . Ischemia of extremity 05/12/2018  . Former smoker 03/14/2018  . 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 Thromboembolism ([place]) 02/25/2018  . Microalbuminuria 09/29/2017  . Insomnia 09/29/2017  . Immunization due 08/01/2017  .  Anxiety and depression 04/28/2017  . 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 Iliac artery occlusion, left ([place]) 02/25/2017  . Tobacco abuse 02/25/2017  . Insulin-requiring or dependent type II diabetes mellitus ([place]) 02/25/2017  . Peripheral vascular disease of lower extremity ([place]) 02/24/2017  . GERD (gastroesophageal reflux disease)   .  Hyperparathyroidism      Current Outpatient Medications on File Prior to Visit  Medication Sig Dispense Refill  . acetaminophen (TYLENOL) 500 MG tablet Take 2,000 mg by mouth daily as needed (for headaches).     Marland Kitchen albuterol (PROAIR HFA) 108 (90 Base) MCG/ACT inhaler Inhale 2 puffs into the lungs every 6 (six) hours as needed for wheezing or shortness of breath. 18 g 2  . amLODipine (NORVASC) 5 MG tablet Take 1 tablet (5 mg total) by mouth daily. 30 tablet 11  . apixaban (ELIQUIS) 5 MG TABS tablet Take 1 tablet (5 mg total) by mouth 2 (two) times daily. 60 tablet 2  . atorvastatin (LIPITOR) 10 MG tablet Take 1 tablet (10 mg total) by mouth daily. 30 tablet 3  . buPROPion (WELLBUTRIN SR) 150 MG 12 hr tablet Take 1 tablet (150 mg total) by mouth 2 (two) times daily. 60 tablet 11  . carvedilol (COREG) 3.125 MG tablet Take 1 tablet (3.125 mg total) by mouth 2 (two) times daily. 60 tablet 6  . cefdinir (OMNICEF) 300 MG capsule Take 1 capsule (300 mg total) by mouth daily. 12 capsule 0  . ferrous sulfate 325 (65 FE) MG tablet Take 1 tablet (325 mg total) by mouth daily with breakfast. 30 tablet 3  . folic acid (FOLVITE) 1 MG tablet Take 1 tablet (1 mg total) by mouth daily. 30 tablet 3  . gabapentin (NEURONTIN) 400 MG capsule Take 1 capsule (400 mg total) by mouth 3 (three) times daily. 90 capsule 3  . insulin aspart (NOVOLOG) 100 UNIT/ML injection Inject 5-15 Units into the skin 3 (three) times daily before meals. 10 mL 3  . Insulin Pen Needle 31G X 5 MM MISC Use with Lantus and Novolog injections. 100 each 11  . Insulin Syringe-Needle U-100 (BD INSULIN SYRINGE ULTRAFINE) 31G X 5/16" 0.5 ML MISC Use as directed 100 each 3  . Insulin Syringes, Disposable, U-100 1 ML MISC Use as directed 100 each 0  . loratadine (CLARITIN) 10 MG tablet Take 1 tablet (10 mg total) by mouth daily as needed for allergies. 30 tablet 2  . megestrol (MEGACE) 40 MG tablet Take 1 tablet (40 mg total) by mouth 2 (two) times  daily. Can increase to two tablets twice a day in the event of heavy bleeding 60 tablet 5  . methocarbamol (ROBAXIN) 500 MG tablet Take 1 tablet (500 mg total) by mouth 2 (two) times daily. 20 tablet 0  . nitroGLYCERIN (NITROSTAT) 0.4 MG SL tablet Place 1 tablet (0.4 mg total) under the tongue every 5 (five) minutes as needed for chest pain. 25 tablet 3  . sertraline (ZOLOFT) 50 MG tablet Take 1 tablet (50 mg total) by mouth daily. 1/2 tab daily x 2 wks then 1 tab PO daily (Patient taking differently: Take 50 mg by mouth daily. ) 30 tablet 4  . sucralfate (CARAFATE) 1 GM/10ML suspension Take 10 mLs (1 g total) by mouth 2 (two) times daily with breakfast and lunch. (Patient taking differently: Take 1 g by mouth 2 (two) times daily as needed (stomach). ) 200 mL 1  . TRUEplus Lancets 28G MISC Use as directed 100 each  4  . vitamin B-12 (CYANOCOBALAMIN) 1000 MCG tablet Take 2 tablets (2,000 mcg total) by mouth daily. 30 tablet 3   No current facility-administered medications on file prior to visit.    Allergies  Allergen Reactions  . Ciprofloxacin Hcl Hives  . Dilaudid [Hydromorphone Hcl] Shortness Of Breath and Other (See Comments)    "Asystole," per pt report  . Macrobid [Nitrofurantoin Macrocrystal] Hives, Shortness Of Breath and Rash  . Other Hives, Shortness Of Breath and Rash    NO "-CILLINS"!!!  . Penicillins Shortness Of Breath    Had had cephalosporins without incident Has patient had a PCN reaction causing immediate rash, facial/tongue/throat swelling, SOB or lightheadedness with hypotension: Yes Has patient had a PCN reaction causing severe rash involving mucus membranes or skin necrosis: Unk Has patient had a PCN reaction that required hospitalization: Unk Has patient had a PCN reaction occurring within the last 10 years: No If all of the above answers are "NO", then may proceed with Cephalosporin use.   . Sulfa Antibiotics Hives, Shortness Of Breath and Rash  . Lexapro  [Escitalopram Oxalate] Other (See Comments)    "I just did not like it."    Social History   Socioeconomic History  . Marital status: Single    Spouse name: Not on file  . Number of children: Not on file  . Years of education: Not on file  . Highest education level: Not on file  Occupational History  . Not on file  Tobacco Use  . Smoking status: Current Some Day Smoker    Packs/day: 0.50    Years: 15.00    Pack years: 7.50    Types: Cigarettes  . Smokeless tobacco: Never Used  Vaping Use  . Vaping Use: Never used  Substance and Sexual Activity  . Alcohol use: Yes    Comment: occassionally  . Drug use: Yes    Types: Marijuana, Cocaine    Comment: once daily- last time used was night of 02/28/2020  . Sexual activity: Yes    Partners: Male    Birth control/protection: Other-see comments    Comment: BTL  Other Topics Concern  . Not on file  Social History Narrative  . Not on file   Social Determinants of Health   Financial Resource Strain:   . Difficulty of Paying Living Expenses:   Food Insecurity:   . Worried About Charity fundraiser in the Last Year:   . Arboriculturist in the Last Year:   Transportation Needs:   . Film/video editor (Medical):   Marland Kitchen Lack of Transportation (Non-Medical):   Physical Activity:   . Days of Exercise per Week:   . Minutes of Exercise per Session:   Stress:   . Feeling of Stress :   Social Connections:   . Frequency of Communication with Friends and Family:   . Frequency of Social Gatherings with Friends and Family:   . Attends Religious Services:   . Active Member of Clubs or Organizations:   . Attends Archivist Meetings:   Marland Kitchen Marital Status:   Intimate Partner Violence:   . Fear of Current or Ex-Partner:   . Emotionally Abused:   Marland Kitchen Physically Abused:   . Sexually Abused:     Family History  Problem Relation Age of Onset  . Depression Mother   . Diabetes Father   . Heart failure Father     Past Surgical  History:  Procedure Laterality Date  . ABDOMINAL AORTOGRAM  W/LOWER EXTREMITY N/A 11/14/2017   Procedure: ABDOMINAL AORTOGRAM W/LOWER EXTREMITY;  Surgeon: Waynetta Sandy, MD;  Location: [facility];  Service: Cardiovascular;  Laterality: N/A;  . ANGIOPLASTY ILLIAC ARTERY Left 02/25/2018   Procedure: BALLOON ANGIOPLASTY LEFT ILIAC ARTERY;  Surgeon: Conrad Loomis, MD;  Location: [facility];  Service: Vascular;  Laterality: Left;  . APPLICATION OF WOUND VAC Left 03/03/2018   Procedure: APPLICATION OF WOUND VAC;  Surgeon: Angelia Mould, MD;  Location: [facility];  Service: Vascular;  Laterality: Left;  . CYSTOSCOPY W/ URETERAL STENT PLACEMENT Right 06/27/2018   Procedure: CYSTOSCOPY WITH RETROGRADE PYELOGRAM/URETERAL DOUBLE J STENT PLACEMENT;  Surgeon: Ceasar Mons, MD;  Location: [facility];  Service: Urology;  Laterality: Right;  . CYSTOSCOPY WITH STENT PLACEMENT N/A 02/23/2019   Procedure: CYSTOSCOPY WITH RIGHT URETERAL STENT EXCHANGE/ LEFT URETERAL STENT PLACEMENT;  Surgeon: Ceasar Mons, MD;  Location: [facility];  Service: Urology;  Laterality: N/A;  . CYSTOSCOPY WITH STENT PLACEMENT Right 06/11/2019   Procedure: CYSTOSCOPY,RIGHT RETROGRADE WITH STENT PLACEMENT;  Surgeon: Irine Seal, MD;  Location: [facility];  Service: Urology;  Laterality: Right;  . CYSTOSCOPY WITH STENT PLACEMENT Right 03/03/2020   Procedure: CYSTOSCOPY WITH STENT PLACEMENT retroagrade pylerogram;  Surgeon: Ardis Hughs, MD;  Location: [facility];  Service: Urology;  Laterality: Right;  . CYSTOSCOPY/RETROGRADE/URETEROSCOPY/STONE EXTRACTION WITH BASKET Bilateral 05/01/2019   Procedure: CYSTOSCOPY/URETEROSCOPY/STONE EXTRACTION / LASER LITHOTRIPSY, BILATERAL URETEROSCOPY WITH URETERAL STONE EXTRACTION AND STENT EXCHANGE;  Surgeon: Ceasar Mons, MD;  [facility];  Service: Urology;  Laterality: Bilateral;  . CYSTOSCOPY/URETEROSCOPY/HOLMIUM LASER/STENT PLACEMENT Right  06/27/2019   Procedure: CYSTOSCOPY/URETEROSCOPY/HOLMIUM LASER/STENT EXCHANGE;  Surgeon: Ceasar Mons, MD;  Location: [facility];  Service: Urology;  Laterality: Right;  . EMBOLECTOMY Left 02/24/2017   Procedure: Left Lower Extremity Embolectomy and Angiogram.;  Surgeon: Waynetta Sandy, MD;  Location: [facility];  Service: Vascular;  Laterality: Left;  . INSERTION OF ILIAC STENT  02/24/2017   Procedure: INSERTION OF Common ILIAC STENT;  Surgeon: Waynetta Sandy, MD;  Location: [facility];  Service: Vascular;;  . INTRAOPERATIVE ARTERIOGRAM Left 02/25/2018   Procedure: INTRA OPERATIVE ARTERIOGRAM WITH LEFT LEG RUNOFF;  Surgeon: Conrad Delanson, MD;  Location: [facility];  Service: Vascular;  Laterality: Left;  . LOWER EXTREMITY ANGIOGRAM Left 02/24/2017   Procedure: Aortagram, Left lower extremity Run-off;  Surgeon: Waynetta Sandy, MD;  Location: [place] [facility];  Service: Vascular;  Laterality: Left;  . PATCH ANGIOPLASTY Left 02/25/2018   Procedure: PATCH ANGIOPLASTY LEFT SUPERFICIAL FEMORAL ARTERY WITH BOVINE PATCH;  Surgeon: Conrad Foster, MD;  Location: [facility];  Service: Vascular;  Laterality: Left;  . PERCUTANEOUS VENOUS THROMBECTOMY,LYSIS WITH INTRAVASCULAR ULTRASOUND (IVUS) Left 05/12/2018   Procedure: MECHANICAL THROMBECTOMY LEFT LEG, BALLOON ANGIOPLASTY LEFT ANTERIOR TIBIAL ARTERY, AORTOGRAM WITH LEFT LEG RUNOFF;  Surgeon: Serafina Mitchell, MD;  Location: [facility];  Service: Vascular;  Laterality: Left;  . 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 THROMBECTOMY FEMORAL ARTERY Left 02/25/2018   Procedure: LEFT ILIAC AND POPLITEAL ARTERY THROMBECTOMY;  Surgeon: Conrad , MD;  Location: [facility];  Service: Vascular;  Laterality: Left;  . TUBAL LIGATION    . WOUND DEBRIDEMENT Left 03/03/2018   Procedure: DEBRIDEMENT WOUND;  Surgeon: Angelia Mould, MD;  Location: [facility];  Service: Vascular;  Laterality: Left;  . WOUND EXPLORATION Left 03/03/2018   Procedure: WOUND EXPLORATION;  Surgeon:  Angelia Mould, MD;  Location: [facility];  Service: Vascular;  Laterality: Left;    ROS: Review of Systems Negative except as stated above  PHYSICAL EXAM: BP (!) 145/96   Pulse (!) 102   Temp (!) 96.3 F (35.7 C)   Resp 16   Wt 253 lb 3.2 oz (114.9 kg)   LMP 02/27/2020   SpO2 98%   BMI 39.66 kg/m   Physical Exam  General appearance - alert, well appearing, obese caucasian female and in no distress Mental status - normal mood, behavior, speech, dress, motor activity, and thought processes Neck - supple, no significant adenopathy Chest - clear to auscultation, no wheezes, rales or rhonchi, symmetric air entry Heart -slight tachycardia but regular Extremities - no LE edema   Lab Results  Component Value Date   HGBA1C 8.4 (H) 01/21/2020    CMP Latest Ref Rng & Units 03/10/2020 03/04/2020 03/04/2020  Glucose 70 - 99 mg/dL 230(H) - 172(H)  BUN 6 - 20 mg/dL 22(H) - 24(H)  Creatinine 0.44 - 1.00 mg/dL 2.14(H) - 2.29(H)  Sodium 135 - 145 mmol/L 133(L) - 138  Potassium 3.5 - 5.1 mmol/L 4.8 4.3 5.3(H)  Chloride 98 - 111 mmol/L 102 - 109  CO2 22 - 32 mmol/L 25 - 21(L)  Calcium 8.9 - 10.3 mg/dL 9.5 - 9.1  Total Protein 6.5 - 8.1 g/dL - - -  Total Bilirubin 0.3 - 1.2 mg/dL - - -  Alkaline Phos 38 - 126 U/L - - -  AST 15 - 41 U/L - - -  ALT 0 - 44 U/L - - -   Lipid Panel     Component Value Date/Time   CHOL 164 01/21/2020 1603   TRIG 235 (H) 01/21/2020 1603   HDL 37 (L) 01/21/2020 1603   CHOLHDL 4.4 01/21/2020 1603   CHOLHDL 6.5 06/27/2018 0126   VLDL 47 (H) 06/27/2018 0126   LDLCALC 87 01/21/2020 1603    CBC    Component Value Date/Time   WBC 13.1 (H) 03/10/2020 0123   RBC 4.44 03/10/2020 0123   HGB 13.1 03/10/2020 0123   HGB 14.6 01/21/2020 1603   HCT 41.1 03/10/2020 0123   HCT 44.1 01/21/2020 1603   PLT 398 03/10/2020 0123   PLT 490 (H) 01/27/2018 1157   MCV 92.6 03/10/2020 0123   MCV 89 01/21/2020 1603   MCH 29.5 03/10/2020 0123   MCHC 31.9 03/10/2020 0123    RDW 14.6 03/10/2020 0123   RDW 13.5 01/21/2020 1603   LYMPHSABS 1.5 03/03/2020 0356   LYMPHSABS 3.1 01/21/2020 1603   MONOABS 1.3 (H) 03/03/2020 0356   EOSABS 0.0 03/03/2020 0356   EOSABS 0.4 01/21/2020 1603   BASOSABS 0.1 03/03/2020 0356   BASOSABS 0.1 01/21/2020 1603    ASSESSMENT AND PLAN: 1. Type 2 diabetes mellitus with microalbuminuria, with long-term current use of insulin (HCC) Improved but not at goal. Continue NovoLog sliding scale.  Increase Lantus to 65 units daily. Continue to encourage healthy eating habits - POCT glucose (manual entry) - insulin glargine (LANTUS) 100 UNIT/ML injection; Inject 0.65 mLs (65 Units total) into the skin at bedtime.  Dispense: 10 mL; Refill: 2  2. Essential hypertension Not at goal but patient has not taken medicines as yet for today.  She will take them as soon as she returns home.  Encourage compliance with medications and low-salt diet.  3. Tobacco dependence Advised to quit.  She is aware of health risks associated with smoking.  Commended her on cutting back.  She is wanting to quit.  Willing to  give a trial of nicotine patches.  Less than 5 minutes spent on counseling. - nicotine (NICODERM CQ - DOSED IN MG/24 HOURS) 14 mg/24hr patch; Place 1 patch (14 mg total) onto the skin daily.  Dispense: 28 patch; Refill: 0  4. Dysfunctional uterine bleeding Plan is for hysterectomy once she has completed cardiac work-up.  5. Stage 4 chronic kidney disease ([place]) Continue to monitor.  Avoid NSAIDs. - Basic Metabolic Panel; Future  6. History of thromboembolism Work-up for inherited hypercoagulable state negative.  The hematologist as recommended that she be changed from Eliquis.  She was on Coumadin in the past and is willing to change back to Coumadin.  We agreed to do this after her urologic procedure on July 1.  We will have her schedule with our clinical pharmacist a few days after so that the switch can be made and INR can be  followed.  7. Polysubstance abuse ([place]) Strongly advised to quit completely.  Declines referral to rehab.  She reports that she is trying hard to quit and knows that she would quit completely once she gets out of her current living environment.    Patient was given the opportunity to ask questions.  Patient verbalized understanding of the plan and was able to repeat key elements of the plan.   Orders Placed This Encounter  Procedures  . Basic Metabolic Panel  . POCT glucose (manual entry)     Requested Prescriptions   Signed Prescriptions Disp Refills  . nicotine (NICODERM CQ - DOSED IN MG/24 HOURS) 14 mg/24hr patch 28 patch 0    Sig: Place 1 patch (14 mg total) onto the skin daily.  . hydrOXYzine (ATARAX/VISTARIL) 50 MG tablet 90 tablet 4    Sig: Take 1 tablet (50 mg total) by mouth 3 (three) times daily as needed for itching or anxiety.  . insulin glargine (LANTUS) 100 UNIT/ML injection 10 mL 2    Sig: Inject 0.65 mLs (65 Units total) into the skin at bedtime.    Return in about 6 weeks (around 05/05/2020) for [place] July 2nd to restart Coumadin.  Karle Plumber, MD, FACP

## 2020-03-25 MED FILL — hydrOXYzine HCL 50 MG TABS: 50 | 30 days supply | Qty: 90 | Fill #0

## 2020-03-25 MED FILL — !LANTUS 100 UNITS/ML VIAL: 100 | 15 days supply | Qty: 10 | Fill #0

## 2020-03-25 MED FILL — NICOTINE 14 MG/24HR PATCH: 14 | 28 days supply | Qty: 28 | Fill #0

## 2020-03-26 ENCOUNTER — Ambulatory Visit (HOSPITAL_COMMUNITY)
Admission: RE | Admit: 2020-03-26 | Discharge: 2020-03-26 | Disposition: A | Discharge status: Home / Self Care | Payer: Medicaid Other | Source: Ambulatory Visit | Attending: Cardiology | Admitting: Cardiology

## 2020-03-26 ENCOUNTER — Other Ambulatory Visit: Payer: Self-pay

## 2020-03-26 DIAGNOSIS — R072 Precordial pain: Secondary | ICD-10-CM | POA: Insufficient documentation

## 2020-03-26 DIAGNOSIS — Z01810 Encounter for preprocedural cardiovascular examination: Secondary | ICD-10-CM | POA: Diagnosis not present

## 2020-03-26 MED ORDER — REGADENOSON 0.4 MG/5ML IV SOLN
0.4000 mg | Freq: Once | INTRAVENOUS | Status: AC
Start: 2020-03-26 — End: 2020-03-26
  Administered 2020-03-26: 0.4 mg via INTRAVENOUS

## 2020-03-26 MED ORDER — TECHNETIUM TC 99M TETROFOSMIN IV KIT
29.1000 | PACK | Freq: Once | INTRAVENOUS | Status: AC | PRN
  Administered 2020-03-26: 29.1 via INTRAVENOUS
  Filled 2020-03-26: qty 30

## 2020-03-27 ENCOUNTER — Ambulatory Visit (HOSPITAL_COMMUNITY): Payer: Medicaid Other | Attending: Internal Medicine

## 2020-03-27 ENCOUNTER — Telehealth: Payer: Self-pay | Admitting: Internal Medicine

## 2020-03-27 NOTE — Telephone Encounter (Signed)
Spoke to Librarian, academic . Patient will  be contacted  department to complete second day of testing  on Jun25  2021.  Patient can not miss appointment

## 2020-03-27 NOTE — Telephone Encounter (Signed)
Patient missed the second part of her myocardial perfusion test.  The next availability is not until 7/1.  Her surgery is scheduled for July 1st.  Does she need to repeat the entire test.

## 2020-03-27 NOTE — Telephone Encounter (Signed)
Routed to [facility] nuclear medicine supervisor and primary nurse to assist  Patient had 2 day myoview and completed part 1 6/23 and no-showed today. I saw no openings for second portion for today to r/s later

## 2020-03-28 ENCOUNTER — Ambulatory Visit (HOSPITAL_COMMUNITY): Payer: Medicaid Other

## 2020-03-28 LAB — MYOCARDIAL PERFUSION IMAGING
LV dias vol: 112 mL (ref 46–106)
LV sys vol: 55 mL
Peak HR: 102 {beats}/min
Rest HR: 99 {beats}/min

## 2020-03-28 MED ORDER — REGADENOSON 0.4 MG/5ML IV SOLN
0.4000 mg | Freq: Once | INTRAVENOUS | Status: DC
Start: 2020-03-28 — End: 2020-03-28

## 2020-03-28 MED ORDER — TECHNETIUM TC 99M TETROFOSMIN IV KIT: 29.1000 | PACK | Freq: Once | INTRAVENOUS | Status: DC | PRN

## 2020-03-31 ENCOUNTER — Other Ambulatory Visit: Payer: Self-pay | Admitting: Urology

## 2020-03-31 ENCOUNTER — Other Ambulatory Visit: Payer: Self-pay

## 2020-03-31 NOTE — Progress Notes (Signed)
DUE TO COVID-19 ONLY ONE VISITOR IS ALLOWED TO COME WITH YOU AND STAY IN THE WAITING ROOM ONLY DURING PRE OP AND PROCEDURE DAY OF SURGERY. THE 1 VISITOR MAY VISIT WITH YOU AFTER SURGERY IN YOUR PRIVATE ROOM DURING VISITING HOURS ONLY!  YOU NEED TO HAVE A COVID 19 TEST ON__6/29/21_____ @_______ , THIS TEST MUST BE DONE BEFORE SURGERY, COME  [place], [place]  , [address].  ([place]) ONCE YOUR COVID TEST IS COMPLETED, PLEASE BEGIN THE QUARANTINE INSTRUCTIONS AS OUTLINED IN YOUR HANDOUT.                Terri Sparks  03/31/2020   Your procedure is scheduled on:  04/03/2020   Report to [facility] Main  Entrance   Report to admitting at   1000 AM     Call this number if you have problems the morning of surgery [phone]    Remember: Do not eat food    :After Midnight. BRUSH YOUR TEETH MORNING OF SURGERY AND RINSE YOUR MOUTH OUT, NO CHEWING GUM CANDY OR MINTS. May have clear liquids from 63midnite until 0900am morning of surgery then nothing by mouth.       Take these medicines the morning of surgery with A SIP OF WATER:  Amlodipine ( NOrvasc), Wellbutrin, Carvedilol ( coreg) , Zoloft, NO NOVOLOG AN OF SURGERY,  Take 1/2 of Lantus insulin nite before surgery .  DO NOT TAKE ANY DIABETIC MEDICATIONS DAY OF YOUR SURGERY                               You may not have any metal on your body including hair pins and              piercings  Do not wear jewelry, make-up, lotions, powders or perfumes, deodorant             Do not wear nail polish on your fingernails.  Do not shave  48 hours prior to surgery.     Do not bring valuables to the hospital. [place].  Contacts, dentures or bridgework may not be worn into surgery.  Leave suitcase in the car. After surgery it may be brought to your room.     Patients discharged the day of surgery will not be allowed to drive home. IF YOU ARE HAVING SURGERY AND  GOING HOME THE SAME DAY, YOU MUST HAVE AN ADULT TO DRIVE YOU HOME AND BE WITH YOU FOR 24 HOURS. YOU MAY GO HOME BY TAXI OR UBER OR ORTHERWISE, BUT AN ADULT MUST ACCOMPANY YOU HOME AND STAY WITH YOU FOR 24 HOURS.  Name and phone number of your driver:                Please read over the following fact sheets you were given: _____________________________________________________________________             [facility] - Preparing for Surgery Before surgery, you can play an important role.  Because skin is not sterile, your skin needs to be as free of germs as possible.  You can reduce the number of germs on your skin by washing with CHG (chlorahexidine gluconate) soap before surgery.  CHG is an antiseptic cleaner which kills germs and bonds with the skin to continue killing germs even after washing. Please  DO NOT use if you have an allergy to CHG or antibacterial soaps.  If your skin becomes reddened/irritated stop using the CHG and inform your nurse when you arrive at Short Stay. Do not shave (including legs and underarms) for at least 48 hours prior to the first CHG shower.  You may shave your face/neck. Please follow these instructions carefully:  1.  Shower with CHG Soap the night before surgery and the  morning of Surgery.  2.  If you choose to wash your hair, wash your hair first as usual with your  normal  shampoo.  3.  After you shampoo, rinse your hair and body thoroughly to remove the  shampoo.                           4.  Use CHG as you would any other liquid soap.  You can apply chg directly  to the skin and wash                       Gently with a scrungie or clean washcloth.  5.  Apply the CHG Soap to your body ONLY FROM THE NECK DOWN.   Do not use on face/ open                           Wound or open sores. Avoid contact with eyes, ears mouth and genitals (private parts).                       Wash face,  Genitals (private parts) with your normal soap.             6.  Wash  thoroughly, paying special attention to the area where your surgery  will be performed.  7.  Thoroughly rinse your body with warm water from the neck down.  8.  DO NOT shower/wash with your normal soap after using and rinsing off  the CHG Soap.                9.  Pat yourself dry with a clean towel.            10.  Wear clean pajamas.            11.  Place clean sheets on your bed the night of your first shower and do not  sleep with pets. Day of Surgery : Do not apply any lotions/deodorants the morning of surgery.  Please wear clean clothes to the hospital/surgery center.  FAILURE TO FOLLOW THESE INSTRUCTIONS MAY RESULT IN THE CANCELLATION OF YOUR SURGERY PATIENT SIGNATURE_________________________________  NURSE SIGNATURE__________________________________  ________________________________________________________________________    CLEAR LIQUID DIET   Foods Allowed                                                                     Foods Excluded  Coffee and tea, regular and decaf                             liquids that you cannot  Plain Jell-O any favor except red or purple  see through such as: Fruit ices (not with fruit pulp)                                     milk, soups, orange juice  Iced Popsicles                                    All solid food Carbonated beverages, regular and diet                                    Cranberry, grape and apple juices Sports drinks like Gatorade Lightly seasoned clear broth or consume(fat free) Sugar, honey syrup  _____________________________________________________________________  How to Manage Your Diabetes Before and After Surgery  Why is it important to control my blood sugar before and after surgery? . Improving blood sugar levels before and after surgery helps healing and can limit problems. . A way of improving blood sugar control is eating a healthy diet by: o  Eating less sugar and  carbohydrates o  Increasing activity/exercise o  Talking with your doctor about reaching your blood sugar goals . High blood sugars (greater than 180 mg/dL) can raise your risk of infections and slow your recovery, so you will need to focus on controlling your diabetes during the weeks before surgery. . Make sure that the doctor who takes care of your diabetes knows about your planned surgery including the date and location.  How do I manage my blood sugar before surgery? . Check your blood sugar at least 4 times a day, starting 2 days before surgery, to make sure that the level is not too high or low. o Check your blood sugar the morning of your surgery when you wake up and every 2 hours until you get to the Short Stay unit. . If your blood sugar is less than 70 mg/dL, you will need to treat for low blood sugar: o Do not take insulin. o Treat a low blood sugar (less than 70 mg/dL) with  cup of clear juice (cranberry or apple), 4 glucose tablets, OR glucose gel. o Recheck blood sugar in 15 minutes after treatment (to make sure it is greater than 70 mg/dL). If your blood sugar is not greater than 70 mg/dL on recheck, call [phone] for further instructions. . Report your blood sugar to the short stay nurse when you get to Short Stay.  . If you are admitted to the hospital after surgery: o Your blood sugar will be checked by the staff and you will probably be given insulin after surgery (instead of oral diabetes medicines) to make sure you have good blood sugar levels. o The goal for blood sugar control after surgery is 80-180 mg/dL.   WHAT DO I DO ABOUT MY DIABETES MEDICATION?  Marland Kitchen Do not take oral diabetes medicines (pills) the morning of surgery.  . THE NIGHT BEFORE SURGERY, take     units of       insulin.       . THE MORNING OF SURGERY, take   units of         insulin.  . The day of surgery, do not take other diabetes injectables, including Byetta (exenatide), Bydureon (exenatide  ER), Victoza (liraglutide), or Trulicity (dulaglutide).  . If your  CBG is greater than 220 mg/dL, you may take  of your sliding scale  . (correction) dose of insulin.    For patients with insulin pumps: Contact your diabetes doctor for specific instructions before surgery. Decrease basal rates by 20% at midnight the night before your surgery. Note that if your surgery is planned to be longer than 2 hours, your insulin pump will be removed and intravenous (IV) insulin will be started and managed by the nurses and the anesthesiologist. You will be able to restart your insulin pump once you are awake and able to manage it.  Make sure to bring insulin pump supplies to the hospital with you in case the  site needs to be changed.  Patient Signature:  Date:   Nurse Signature:  Date:   Reviewed and Endorsed by [facility] Patient Education Committee, August 2015

## 2020-03-31 NOTE — Progress Notes (Addendum)
Anesthesia Chart Review:   Case: [id number] Date/Time: 04/03/20 1147   Procedure: CYSTOSCOPY WITH RIGHT  RETROGRADE PYELOGRAM, URETEROSCOPY WIHT HOLMIUM LASERAND STENT EXCHANGE PLACEMENT (Right )   Anesthesia type: General   Pre-op diagnosis: RIGHT URETERAL STONE   Location: [place] [facility]   Surgeons: Ardis Hughs, MD     Addendum:   - Pt labs completed 04/01/20.  - BMP showed glucose 181; Cr 1.51 - CBC showed WBC 11.5 -HbA1c was 8.9  I routed A1c results to surgeon and to PCP for f/u uncontrolled DM.    DISCUSSION:  Pt is a 44 year old with hx PVD (s/p stent to L iliac artery), HTN, DM, CKD (stage 4), hyperparathyroidism (s/p resection parathyroid adenoma), IDA, B12 deficiency, recurrent LLE arterial thromboembolism, substance abuse (cocaine). Current smoker.   - Hospitalized 5/31-03/04/20 for urosepsis with R kidney stone and hydronephrosis  Pt cleared for surgery at intermediate risk due to comorbidities; pt is "optimized from a cardiovascular standpoint" per Dr. Margaretann Loveless.   Pt to hold eliquis 72 hours before surgery   PROVIDERS: - PCP is Ladell Pier, MD - Vascular surgeon is Servando Snare, MD - Cardiologist is Cherlynn Kaiser, MD. Last office visit 03/11/20 - Hematologist is Sullivan Lone, MD. Last office visit 03/14/20; prn f/u recommended   LABS: Will be obtained day of surgery    IMAGES: CXR 09/22/19: Negative.  No active cardiopulmonary disease.   EKG 03/11/20: NSR   CV:  Nuclear stress test 03/28/20:  - Stress images only  Nuclear stress EF: 51%.  The left ventricular ejection fraction is mildly decreased (45-54%).  There was no ST segment deviation noted during stress. - Mildly abnormal, low risk stress nuclear study with no ischemia or infarction. Gated ejection fraction 51% with mild global hypokinesis   Echo 09/23/19:  1. Left ventricular ejection fraction, by visual estimation, is 45 to 50%. The left ventricle has normal function.  There is no left ventricular hypertrophy.  2. Left ventricular diastolic parameters are indeterminate.  3. The left ventricle demonstrates global hypokinesis.  4. Global right ventricle has normal systolic function.The right ventricular size is normal. No increase in right ventricular wall thickness.  5. Left atrial size was normal.  6. Right atrial size was normal.  7. The mitral valve is normal in structure. Trivial mitral valve regurgitation. No evidence of mitral stenosis.  8. The tricuspid valve is normal in structure. Tricuspid valve regurgitation is trivial.  9. The aortic valve is normal in structure. Aortic valve regurgitation is not visualized. No evidence of aortic valve sclerosis or stenosis.  10. The pulmonic valve was normal in structure. Pulmonic valve regurgitation is trivial.  11. TR signal is inadequate for assessing pulmonary artery systolic pressure.  12. The inferior vena cava is normal in size with greater than 50%  respiratory variability, suggesting right atrial pressure of 3 mmHg.    US aorta/iliacs 08/17/19:  - IVC/Iliac: The left common iliac artery stent appears patent. The walls of  the stent somewhat difficult to visualize. Triphasic waveforms noted throughout the stent area and the external iliac artery. Further modalities for imaging the stent may be waranted.     Past Medical History:  Diagnosis Date  . Anxiety   . Depression   . Diabetes ([place])    Type 2  . GERD (gastroesophageal reflux disease)   . History of blood clots    ,DVT-left leg, and early 2000, had blood clot behind left breast  . History  of kidney stones   . Hypercalcemia   . Hyperparathyroidism   . Hypertension    had been on Lisinopril and PCP took her off it 4 months ago  . Iron deficiency anemia   . Left thyroid nodule   . Mass of right ovary   . Nephrolithiasis   . PAOD (peripheral arterial occlusive disease) ([place])   . Peripheral vascular disease ([place])   . Prolonged Q-T  interval on ECG 04/19/2019  . Renal calculi   . Substance abuse ([place])   . Tooth loose    top tooth from the right  is loose     Past Surgical History:  Procedure Laterality Date  . ABDOMINAL AORTOGRAM W/LOWER EXTREMITY N/A 11/14/2017   Procedure: ABDOMINAL AORTOGRAM W/LOWER EXTREMITY;  Surgeon: Waynetta Sandy, MD;  Location: [place] [facility];  Service: Cardiovascular;  Laterality: N/A;  . ANGIOPLASTY ILLIAC ARTERY Left 02/25/2018   Procedure: BALLOON ANGIOPLASTY LEFT ILIAC ARTERY;  Surgeon: Conrad Marysville, MD;  Location: [place];  Service: Vascular;  Laterality: Left;  . APPLICATION OF WOUND VAC Left 03/03/2018   Procedure: APPLICATION OF WOUND VAC;  Surgeon: Angelia Mould, MD;  Location: [place];  Service: Vascular;  Laterality: Left;  . CYSTOSCOPY W/ URETERAL STENT PLACEMENT Right 06/27/2018   Procedure: CYSTOSCOPY WITH RETROGRADE PYELOGRAM/URETERAL DOUBLE J STENT PLACEMENT;  Surgeon: Ceasar Mons, MD;  Location: [place];  Service: Urology;  Laterality: Right;  . CYSTOSCOPY WITH STENT PLACEMENT N/A 02/23/2019   Procedure: CYSTOSCOPY WITH RIGHT URETERAL STENT EXCHANGE/ LEFT URETERAL STENT PLACEMENT;  Surgeon: Ceasar Mons, MD;  [facility];  Service: Urology;  Laterality: N/A;  . CYSTOSCOPY WITH STENT PLACEMENT Right 06/11/2019   Procedure: CYSTOSCOPY,RIGHT RETROGRADE WITH STENT PLACEMENT;  Surgeon: Irine Seal, MD;  [facility];  Service: Urology;  Laterality: Right;  . CYSTOSCOPY WITH STENT PLACEMENT Right 03/03/2020   Procedure: CYSTOSCOPY WITH STENT PLACEMENT retroagrade pylerogram;  Surgeon: Ardis Hughs, MD;  [facility];  Service: Urology;  Laterality: Right;  . CYSTOSCOPY/RETROGRADE/URETEROSCOPY/STONE EXTRACTION WITH BASKET Bilateral 05/01/2019   Procedure: CYSTOSCOPY/URETEROSCOPY/STONE EXTRACTION / LASER LITHOTRIPSY, BILATERAL URETEROSCOPY WITH URETERAL STONE EXTRACTION AND STENT EXCHANGE;  Surgeon: Ceasar Mons, MD;   [facility];  Service: Urology;  Laterality: Bilateral;  . CYSTOSCOPY/URETEROSCOPY/HOLMIUM LASER/STENT PLACEMENT Right 06/27/2019   Procedure: CYSTOSCOPY/URETEROSCOPY/HOLMIUM LASER/STENT EXCHANGE;  Surgeon: Ceasar Mons, MD;  [facility];  Service: Urology;  Laterality: Right;  . EMBOLECTOMY Left 02/24/2017   Procedure: Left Lower Extremity Embolectomy and Angiogram.;  Surgeon: Waynetta Sandy, MD;  Location: [place];  Service: Vascular;  Laterality: Left;  . INSERTION OF ILIAC STENT  02/24/2017   Procedure: INSERTION OF Common ILIAC STENT;  Surgeon: Waynetta Sandy, MD;  Location: [place];  Service: Vascular;;  . INTRAOPERATIVE ARTERIOGRAM Left 02/25/2018   Procedure: INTRA OPERATIVE ARTERIOGRAM WITH LEFT LEG RUNOFF;  Surgeon: Conrad Long Barn, MD;  Location: [place];  Service: Vascular;  Laterality: Left;  . LOWER EXTREMITY ANGIOGRAM Left 02/24/2017   Procedure: Aortagram, Left lower extremity Run-off;  Surgeon: Waynetta Sandy, MD;  Location: [place];  Service: Vascular;  Laterality: Left;  . PATCH ANGIOPLASTY Left 02/25/2018   Procedure: PATCH ANGIOPLASTY LEFT SUPERFICIAL FEMORAL ARTERY WITH BOVINE PATCH;  Surgeon: Conrad Toone, MD;  Location: [place];  Service: Vascular;  Laterality: Left;  . PERCUTANEOUS VENOUS THROMBECTOMY,LYSIS WITH INTRAVASCULAR ULTRASOUND (IVUS) Left 05/12/2018   Procedure: MECHANICAL THROMBECTOMY LEFT LEG, BALLOON ANGIOPLASTY LEFT ANTERIOR TIBIAL  ARTERY, AORTOGRAM WITH LEFT LEG RUNOFF;  Surgeon: Serafina Mitchell, MD;  [facility];  Service: Vascular;  Laterality: Left;  . 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 THROMBECTOMY FEMORAL ARTERY Left 02/25/2018   Procedure: LEFT ILIAC AND POPLITEAL ARTERY THROMBECTOMY;  Surgeon: Conrad Ronda, MD;  Location: [place];  Service: Vascular;  Laterality: Left;  . TUBAL LIGATION    . WOUND DEBRIDEMENT Left 03/03/2018   Procedure: DEBRIDEMENT WOUND;  Surgeon: Angelia Mould, MD;  Location: [place];  Service: Vascular;  Laterality: Left;  . WOUND EXPLORATION Left 03/03/2018   Procedure: WOUND EXPLORATION;  Surgeon: Angelia Mould, MD;  Location: [facility];  Service: Vascular;  Laterality: Left;    MEDICATIONS: No current facility-administered medications for this encounter.   Marland Kitchen acetaminophen (TYLENOL) 500 MG tablet  . albuterol (PROAIR HFA) 108 (90 Base) MCG/ACT inhaler  . amLODipine (NORVASC) 5 MG tablet  . apixaban (ELIQUIS) 5 MG TABS tablet  . atorvastatin (LIPITOR) 10 MG tablet  . buPROPion (WELLBUTRIN SR) 150 MG 12 hr tablet  . carvedilol (COREG) 3.125 MG tablet  . cefdinir (OMNICEF) 300 MG capsule  . ferrous sulfate 325 (65 FE) MG tablet  . folic acid (FOLVITE) 1 MG tablet  . gabapentin (NEURONTIN) 400 MG capsule  . insulin aspart (NOVOLOG) 100 UNIT/ML injection  . loratadine (CLARITIN) 10 MG tablet  . methocarbamol (ROBAXIN) 500 MG tablet  . nitroGLYCERIN (NITROSTAT) 0.4 MG SL tablet  . sertraline (ZOLOFT) 50 MG tablet  . sucralfate (CARAFATE) 1 GM/10ML suspension  . vitamin B-12 (CYANOCOBALAMIN) 1000 MCG tablet  . hydrOXYzine (ATARAX/VISTARIL) 50 MG tablet  . insulin glargine (LANTUS) 100 UNIT/ML injection  . Insulin Pen Needle 31G X 5 MM MISC  . Insulin Syringe-Needle U-100 (BD INSULIN SYRINGE ULTRAFINE) 31G X 5/16" 0.5 ML MISC  . Insulin Syringes, Disposable, U-100 1 ML MISC  . megestrol (MEGACE) 40 MG tablet  . nicotine (NICODERM CQ - DOSED IN MG/24 HOURS) 14 mg/24hr patch  . TRUEplus Lancets 28G MISC   - Pt is no longer taking megace - Pt to hold eliquis 72 hours before surgery   If no changes, I anticipate pt can proceed with surgery as scheduled.   Willeen Cass, FNP-BC [facility] Short Stay Surgical Center/Anesthesiology Phone: [phone] 04/02/2020 10:16 AM

## 2020-03-31 NOTE — Progress Notes (Addendum)
Patient returned call and preop covid test and lab test set up for 6/29 at 1pm and 2pm .  Patient unsure of preop Eliquis instrucitons.  Instructed pt to call Dr Louis Meckel office and speak with Darrel Reach surgery scheduler at [phone] ext [phone].  Patient voiced understanding.

## 2020-03-31 NOTE — Progress Notes (Signed)
LVMM for patient to call and set up Covid Test appt and preop appt for labs.  And to review preop instructions .

## 2020-03-31 NOTE — Progress Notes (Addendum)
Anesthesia Review:  PCP: Karle Plumber , Internal Medicine  Cardiologist : Chest x-ray : EKG :03/11/2020  Echo :09/13/2019  Stress Test 03/28/2020- info on chart  Cardiac Cath :  Activity level:  Sleep Study/ CPAP : Diabetes Type 2  Fasting Blood Sugar :      / Checks Blood Sugar -- times a day:   Blood Thinner/ Instructions /Last Dose: ASA / Instructions/ Last Dose :  Eliquis  Per note on 03/17/2020- patient needed cardiac clearance prior to surgery . Last cardiology( DR Margaretann Loveless)  office visit note of 03/11/2020 recommended stress test.  Patient was a no show for stress test on 03/14/20.    Stress test done 03/28/2020.  Results on chart .  Would like to know if results on chart are acceptable for surgery and I can schedule her for labs and covid test.

## 2020-03-31 NOTE — Progress Notes (Signed)
Patient called back to state per Darrel Reach , Surgery Scheduler at [facility] she is to stop Eliquis 72 hours prior to procedure and pt voiced understanding of this.  Updated medical history and reviewed current medication regimen.  Instructed patient on what meds to take am of procedure and reviewed preop instructions.  Patient aware that she will received typed copy of preop instructions on lap appt at 04/01/20 at 2pm.   patinet voiced understanding.

## 2020-03-31 NOTE — Anesthesia Preprocedure Evaluation (Addendum)
Anesthesia Evaluation  Patient identified by MRN, date of birth, ID band Patient awake    Reviewed: Allergy & Precautions, NPO status , Patient's Chart, lab work & pertinent test results, reviewed documented beta blocker date and time   Airway Mallampati: II  TM Distance: >3 FB Neck ROM: Full    Dental  (+) Poor Dentition, Loose, Missing, Dental Advisory Given   Pulmonary Current Smoker and Patient abstained from smoking.,    Pulmonary exam normal breath sounds clear to auscultation       Cardiovascular hypertension, Pt. on medications and Pt. on home beta blockers + Peripheral Vascular Disease and + DVT  Normal cardiovascular exam Rhythm:Regular Rate:Normal     Neuro/Psych PSYCHIATRIC DISORDERS Anxiety Depression  Neuromuscular disease    GI/Hepatic GERD  Medicated and Controlled,(+)     substance abuse  cocaine use and marijuana use,   Endo/Other  diabetes, Well Controlled, Type 2, Insulin Dependent  Renal/GU Renal diseaseRight ureteral calculus  negative genitourinary   Musculoskeletal negative musculoskeletal ROS (+)   Abdominal (+) + obese,   Peds  Hematology  (+) anemia ,   Anesthesia Other Findings   Reproductive/Obstetrics                            Anesthesia Physical Anesthesia Plan  ASA: III  Anesthesia Plan: General   Post-op Pain Management:    Induction: Intravenous  PONV Risk Score and Plan: 4 or greater and Ondansetron, Dexamethasone, Treatment may vary due to age or medical condition and Midazolam  Airway Management Planned: LMA  Additional Equipment:   Intra-op Plan:   Post-operative Plan: Extubation in OR  Informed Consent: I have reviewed the patients History and Physical, chart, labs and discussed the procedure including the risks, benefits and alternatives for the proposed anesthesia with the patient or authorized representative who has indicated his/her  understanding and acceptance.     Dental advisory given  Plan Discussed with: CRNA  Anesthesia Plan Comments: (See APP note by Durel Salts FNP)       Anesthesia Quick Evaluation

## 2020-04-01 ENCOUNTER — Other Ambulatory Visit (HOSPITAL_COMMUNITY)
Admission: RE | Admit: 2020-04-01 | Discharge: 2020-04-01 | Disposition: A | Discharge status: Home / Self Care | Payer: Medicaid Other | Source: Ambulatory Visit | Attending: Urology | Admitting: Urology

## 2020-04-01 ENCOUNTER — Encounter (HOSPITAL_COMMUNITY)
Admission: RE | Admit: 2020-04-01 | Discharge: 2020-04-01 | Disposition: A | Discharge status: Home / Self Care | Payer: Medicaid Other | Source: Ambulatory Visit | Attending: Urology | Admitting: Urology

## 2020-04-01 DIAGNOSIS — Z01812 Encounter for preprocedural laboratory examination: Secondary | ICD-10-CM | POA: Insufficient documentation

## 2020-04-01 DIAGNOSIS — Z20822 Contact with and (suspected) exposure to covid-19: Secondary | ICD-10-CM | POA: Diagnosis not present

## 2020-04-01 LAB — BASIC METABOLIC PANEL
Anion gap: 7 (ref 5–15)
BUN: 12 mg/dL (ref 6–20)
CO2: 26 mmol/L (ref 22–32)
Calcium: 9.7 mg/dL (ref 8.9–10.3)
Chloride: 105 mmol/L (ref 98–111)
Creatinine, Ser: 1.5 mg/dL — ABNORMAL HIGH (ref 0.44–1.00)
GFR calc Af Amer: 48 mL/min — ABNORMAL LOW (ref >60)
GFR calc non Af Amer: 42 mL/min — ABNORMAL LOW (ref >60)
Glucose, Bld: 181 mg/dL — ABNORMAL HIGH (ref 70–99)
Potassium: 4.6 mmol/L (ref 3.5–5.1)
Sodium: 138 mmol/L (ref 135–145)

## 2020-04-01 LAB — HCG, SERUM, QUALITATIVE: Preg, Serum: NEGATIVE

## 2020-04-01 LAB — CBC
HCT: 44.3 % (ref 36.0–46.0)
Hemoglobin: 14.3 g/dL (ref 12.0–15.0)
MCH: 30.2 pg (ref 26.0–34.0)
MCHC: 32.3 g/dL (ref 30.0–36.0)
MCV: 93.7 fL (ref 80.0–100.0)
Platelets: 347 10*3/uL (ref 150–400)
RBC: 4.73 MIL/uL (ref 3.87–5.11)
RDW: 14.6 % (ref 11.5–15.5)
WBC: 11.5 10*3/uL — ABNORMAL HIGH (ref 4.0–10.5)
nRBC: 0 % (ref 0.0–0.2)

## 2020-04-01 LAB — SARS CORONAVIRUS 2 (TAT 6-24 HRS): SARS Coronavirus 2: NEGATIVE

## 2020-04-01 NOTE — Progress Notes (Signed)
BMP and CBC routed via epic to Dr Louis Meckel.

## 2020-04-02 ENCOUNTER — Other Ambulatory Visit: Payer: Self-pay | Admitting: Urology

## 2020-04-02 ENCOUNTER — Telehealth: Payer: Self-pay | Admitting: Internal Medicine

## 2020-04-02 LAB — HEMOGLOBIN A1C
Hgb A1c MFr Bld: 8.9 % — ABNORMAL HIGH (ref 4.8–5.6)
Mean Plasma Glucose: 209 mg/dL

## 2020-04-02 NOTE — Progress Notes (Signed)
HGBA1c done 6/29 routed via epic to Dr Smitty Pluck.

## 2020-04-02 NOTE — Progress Notes (Signed)
Called pt to see if any questions in regards to preop instructions with patient.  Patient stated she voiced understanding of preop instructions. Patient aware to take 1/2 of  Lantus Insulin dose tonite as printed on preop instructions and no Novolog am of surgery.  Patient also aware to have bedtime snack of protein tonite before midnite.

## 2020-04-02 NOTE — Progress Notes (Addendum)
Anesthesia Review:  PCP: Karle Plumber LOV 03/24/20  Cardiologist : Dr Margaretann Loveless - lov- 03/11/20  Chest x-ray : EKG :03/11/20  Echo :09/23/19  Cardiac Cath :  Stress Test - 03/28/20 - results on chart  Activity level:  Sleep Study/ CPAP : Fasting Blood Sugar :      / Checks Blood Sugar -- times a day:   Blood Thinner/ Instructions /Last Dose: ASA / Instructions/ Last Dose : Eliquis- pt to stop 3 days prior  DM- type 2 CBC , BMp and A1 c done- 04/01/20- A1C - 8.9  For cysto on 04/03/20

## 2020-04-02 NOTE — Telephone Encounter (Signed)
Message received from the cardiologist.  I will let the gynecologist know and if patient is willing to proceed with getting the hysterectomy, we can move forward with that.

## 2020-04-02 NOTE — Telephone Encounter (Signed)
-----   Message from Elouise Munroe, MD sent at 03/30/2020  9:54 PM EDT ----- The patient was unable to present to the office to complete the resting pictures for her stress test. The stress images show no defect suggesting there is no infarct or ischemia. She remains intermediate risk for general anesthesia given comorbidities, but no further cardiovascular testing is required at this time prior to ureteral stent procedure which is upcoming I believe.   If this level of risk is acceptable to the patient and surgical team, the patient should be considered optimized from a cardiovascular standpoint.

## 2020-04-03 ENCOUNTER — Ambulatory Visit (HOSPITAL_COMMUNITY): Payer: Medicaid Other | Admitting: Physician Assistant

## 2020-04-03 ENCOUNTER — Ambulatory Visit (HOSPITAL_COMMUNITY)
Admission: RE | Admit: 2020-04-03 | Discharge: 2020-04-03 | Disposition: A | Discharge status: Home / Self Care | Payer: Medicaid Other | Attending: Urology | Admitting: Urology

## 2020-04-03 ENCOUNTER — Ambulatory Visit (HOSPITAL_COMMUNITY): Payer: Medicaid Other

## 2020-04-03 ENCOUNTER — Ambulatory Visit (HOSPITAL_COMMUNITY): Payer: Medicaid Other | Admitting: Emergency Medicine

## 2020-04-03 ENCOUNTER — Encounter (HOSPITAL_COMMUNITY)
Admission: RE | Disposition: A | Discharge status: Home / Self Care | Payer: Self-pay | Source: Home / Self Care | Attending: Urology

## 2020-04-03 ENCOUNTER — Encounter (HOSPITAL_COMMUNITY): Payer: Self-pay | Admitting: Urology

## 2020-04-03 DIAGNOSIS — Z794 Long term (current) use of insulin: Secondary | ICD-10-CM | POA: Diagnosis not present

## 2020-04-03 DIAGNOSIS — D509 Iron deficiency anemia, unspecified: Secondary | ICD-10-CM | POA: Insufficient documentation

## 2020-04-03 DIAGNOSIS — Z881 Allergy status to other antibiotic agents status: Secondary | ICD-10-CM | POA: Insufficient documentation

## 2020-04-03 DIAGNOSIS — K219 Gastro-esophageal reflux disease without esophagitis: Secondary | ICD-10-CM | POA: Insufficient documentation

## 2020-04-03 DIAGNOSIS — F17210 Nicotine dependence, cigarettes, uncomplicated: Secondary | ICD-10-CM | POA: Diagnosis not present

## 2020-04-03 DIAGNOSIS — Z79899 Other long term (current) drug therapy: Secondary | ICD-10-CM | POA: Insufficient documentation

## 2020-04-03 DIAGNOSIS — N200 Calculus of kidney: Secondary | ICD-10-CM | POA: Diagnosis not present

## 2020-04-03 DIAGNOSIS — Z888 Allergy status to other drugs, medicaments and biological substances status: Secondary | ICD-10-CM | POA: Diagnosis not present

## 2020-04-03 DIAGNOSIS — Z792 Long term (current) use of antibiotics: Secondary | ICD-10-CM | POA: Diagnosis not present

## 2020-04-03 DIAGNOSIS — E1151 Type 2 diabetes mellitus with diabetic peripheral angiopathy without gangrene: Secondary | ICD-10-CM | POA: Diagnosis not present

## 2020-04-03 DIAGNOSIS — Z7901 Long term (current) use of anticoagulants: Secondary | ICD-10-CM | POA: Diagnosis not present

## 2020-04-03 DIAGNOSIS — Z885 Allergy status to narcotic agent status: Secondary | ICD-10-CM | POA: Insufficient documentation

## 2020-04-03 DIAGNOSIS — Z87442 Personal history of urinary calculi: Secondary | ICD-10-CM | POA: Insufficient documentation

## 2020-04-03 DIAGNOSIS — I10 Essential (primary) hypertension: Secondary | ICD-10-CM | POA: Insufficient documentation

## 2020-04-03 DIAGNOSIS — Z880 Allergy status to penicillin: Secondary | ICD-10-CM | POA: Diagnosis not present

## 2020-04-03 DIAGNOSIS — Z882 Allergy status to sulfonamides status: Secondary | ICD-10-CM | POA: Diagnosis not present

## 2020-04-03 DIAGNOSIS — F419 Anxiety disorder, unspecified: Secondary | ICD-10-CM | POA: Diagnosis not present

## 2020-04-03 DIAGNOSIS — N201 Calculus of ureter: Secondary | ICD-10-CM | POA: Diagnosis not present

## 2020-04-03 DIAGNOSIS — F329 Major depressive disorder, single episode, unspecified: Secondary | ICD-10-CM | POA: Diagnosis not present

## 2020-04-03 DIAGNOSIS — E119 Type 2 diabetes mellitus without complications: Secondary | ICD-10-CM | POA: Diagnosis not present

## 2020-04-03 HISTORY — PX: CYSTOSCOPY WITH RETROGRADE PYELOGRAM, URETEROSCOPY AND STENT PLACEMENT: SHX5789

## 2020-04-03 LAB — GLUCOSE, CAPILLARY: Glucose-Capillary: 217 mg/dL — ABNORMAL HIGH (ref 70–99)

## 2020-04-03 SURGERY — CYSTOURETEROSCOPY, WITH RETROGRADE PYELOGRAM AND STENT INSERTION
Anesthesia: General | Laterality: Right

## 2020-04-03 MED ORDER — CEPHALEXIN 500 MG PO CAPS
500.0000 mg | ORAL_CAPSULE | Freq: Two times a day (BID) | ORAL | 0 refills | Status: DC
Start: 2020-04-03 — End: 2020-04-04

## 2020-04-03 MED ORDER — GENTAMICIN SULFATE 40 MG/ML IJ SOLN
5.0000 mg/kg | Freq: Once | INTRAVENOUS | Status: AC
  Administered 2020-04-03: 400 mg via INTRAVENOUS
  Filled 2020-04-03: qty 10

## 2020-04-03 MED ORDER — PHENYLEPHRINE 40 MCG/ML (10ML) SYRINGE FOR IV PUSH (FOR BLOOD PRESSURE SUPPORT)
PREFILLED_SYRINGE | INTRAVENOUS | Status: AC
  Filled 2020-04-03: qty 10

## 2020-04-03 MED ORDER — OXYCODONE HCL 5 MG PO TABS: 5.0000 mg | ORAL_TABLET | Freq: Once | ORAL | Status: AC | PRN

## 2020-04-03 MED ORDER — ONDANSETRON HCL 4 MG/2ML IJ SOLN
INTRAMUSCULAR | Status: DC | PRN
  Administered 2020-04-03: 4 mg via INTRAVENOUS

## 2020-04-03 MED ORDER — FENTANYL CITRATE (PF) 100 MCG/2ML IJ SOLN: 25.0000 ug | INTRAMUSCULAR | Status: DC | PRN

## 2020-04-03 MED ORDER — LACTATED RINGERS IV SOLN: INTRAVENOUS | Status: DC

## 2020-04-03 MED ORDER — PHENYLEPHRINE HCL (PRESSORS) 10 MG/ML IV SOLN
INTRAVENOUS | Status: DC | PRN
Start: 2020-04-03 — End: 2020-04-03
  Administered 2020-04-03: 80 ug via INTRAVENOUS
  Administered 2020-04-03: 60 ug via INTRAVENOUS

## 2020-04-03 MED ORDER — PROPOFOL 10 MG/ML IV BOLUS
INTRAVENOUS | Status: DC | PRN
  Administered 2020-04-03: 200 mg via INTRAVENOUS

## 2020-04-03 MED ORDER — ONDANSETRON HCL 4 MG/2ML IJ SOLN
INTRAMUSCULAR | Status: AC
  Filled 2020-04-03: qty 2

## 2020-04-03 MED ORDER — SODIUM CHLORIDE 0.9 % IR SOLN
Status: DC | PRN
  Administered 2020-04-03: 3000 mL

## 2020-04-03 MED ORDER — MEPERIDINE HCL 50 MG/ML IJ SOLN: 6.2500 mg | INTRAMUSCULAR | Status: DC | PRN

## 2020-04-03 MED ORDER — OXYCODONE HCL 5 MG PO TABS
ORAL_TABLET | ORAL | Status: AC
  Administered 2020-04-03: 5 mg via ORAL
  Filled 2020-04-03: qty 1

## 2020-04-03 MED ORDER — MIDAZOLAM HCL 5 MG/5ML IJ SOLN
INTRAMUSCULAR | Status: DC | PRN
  Administered 2020-04-03: 2 mg via INTRAVENOUS

## 2020-04-03 MED ORDER — FENTANYL CITRATE (PF) 100 MCG/2ML IJ SOLN
INTRAMUSCULAR | Status: DC | PRN
  Administered 2020-04-03: 100 ug via INTRAVENOUS
  Administered 2020-04-03: 25 ug via INTRAVENOUS
  Administered 2020-04-03 (×2): 50 ug via INTRAVENOUS
  Administered 2020-04-03: 25 ug via INTRAVENOUS

## 2020-04-03 MED ORDER — FENTANYL CITRATE (PF) 250 MCG/5ML IJ SOLN
INTRAMUSCULAR | Status: AC
  Filled 2020-04-03: qty 5

## 2020-04-03 MED ORDER — LIDOCAINE 2% (20 MG/ML) 5 ML SYRINGE
INTRAMUSCULAR | Status: AC
  Filled 2020-04-03: qty 5

## 2020-04-03 MED ORDER — DEXAMETHASONE SODIUM PHOSPHATE 4 MG/ML IJ SOLN
INTRAMUSCULAR | Status: DC | PRN
Start: 2020-04-03 — End: 2020-04-03
  Administered 2020-04-03: 4 mg via INTRAVENOUS

## 2020-04-03 MED ORDER — CHLORHEXIDINE GLUCONATE 0.12 % MT SOLN: 15.0000 mL | Freq: Once | OROMUCOSAL | Status: AC

## 2020-04-03 MED ORDER — IOHEXOL 300 MG/ML  SOLN
INTRAMUSCULAR | Status: DC | PRN
  Administered 2020-04-03: 10 mL

## 2020-04-03 MED ORDER — ORAL CARE MOUTH RINSE
15.0000 mL | Freq: Once | OROMUCOSAL | Status: AC
  Administered 2020-04-03: 15 mL via OROMUCOSAL

## 2020-04-03 MED ORDER — PROPOFOL 10 MG/ML IV BOLUS
INTRAVENOUS | Status: AC
  Filled 2020-04-03: qty 20

## 2020-04-03 MED ORDER — MIDAZOLAM HCL 2 MG/2ML IJ SOLN
INTRAMUSCULAR | Status: AC
  Filled 2020-04-03: qty 2

## 2020-04-03 MED ORDER — LIDOCAINE HCL 1 % IJ SOLN
INTRAMUSCULAR | Status: DC | PRN
  Administered 2020-04-03: 60 mg via INTRADERMAL

## 2020-04-03 SURGICAL SUPPLY — 18 items
BAG URO CATCHER STRL LF (MISCELLANEOUS) ×2 IMPLANT
BASKET ZERO TIP NITINOL 2.4FR (BASKET) ×2 IMPLANT
CATH URET 5FR 28IN OPEN ENDED (CATHETERS) ×2 IMPLANT
CLOTH BEACON ORANGE TIMEOUT ST (SAFETY) ×2 IMPLANT
EXTRACTOR STONE 1.7FRX115CM (UROLOGICAL SUPPLIES) IMPLANT
FIBER LASER TRAC TIP (UROLOGICAL SUPPLIES) ×2 IMPLANT
GLOVE BIOGEL M STRL SZ7.5 (GLOVE) ×2 IMPLANT
GOWN STRL REUS W/TWL XL LVL3 (GOWN DISPOSABLE) ×2 IMPLANT
GUIDEWIRE ANG ZIPWIRE 038X150 (WIRE) IMPLANT
GUIDEWIRE STR DUAL SENSOR (WIRE) ×4 IMPLANT
KIT TURNOVER KIT A (KITS) IMPLANT
MANIFOLD NEPTUNE II (INSTRUMENTS) ×2 IMPLANT
PACK CYSTO (CUSTOM PROCEDURE TRAY) ×2 IMPLANT
SHEATH URETERAL 12FRX28CM (UROLOGICAL SUPPLIES) IMPLANT
SHEATH URETERAL 12FRX35CM (MISCELLANEOUS) IMPLANT
STENT URET 6FRX24 CONTOUR (STENTS) ×2 IMPLANT
TUBING CONNECTING 10 (TUBING) ×2 IMPLANT
TUBING UROLOGY SET (TUBING) ×2 IMPLANT

## 2020-04-03 NOTE — Anesthesia Procedure Notes (Signed)
Procedure Name: LMA Insertion Date/Time: 04/03/2020 12:28 PM Performed by: Garrel Ridgel, CRNA Pre-anesthesia Checklist: Patient identified, Emergency Drugs available, Suction available, Patient being monitored and Timeout performed Patient Re-evaluated:Patient Re-evaluated prior to induction Oxygen Delivery Method: Circle system utilized Preoxygenation: Pre-oxygenation with 100% oxygen Induction Type: IV induction Ventilation: Mask ventilation without difficulty LMA: LMA inserted LMA Size: 4.0 Number of attempts: 1 Placement Confirmation: positive ETCO2 Tube secured with: Tape Dental Injury: Teeth and Oropharynx as per pre-operative assessment

## 2020-04-03 NOTE — Anesthesia Postprocedure Evaluation (Addendum)
Anesthesia Post Note  Patient: Designer, fashion/clothing  Procedure(s) Performed: CYSTOSCOPY WITH RIGHT  RETROGRADE PYELOGRAM, URETEROSCOPY WITH HOLMIUM LASER AND STENT EXCHANGE PLACEMENT (Right )     Patient location during evaluation: PACU Anesthesia Type: General Level of consciousness: awake and alert and oriented Pain management: pain level controlled Vital Signs Assessment: post-procedure vital signs reviewed and stable Respiratory status: spontaneous breathing, nonlabored ventilation and respiratory function stable Cardiovascular status: blood pressure returned to baseline and stable Postop Assessment: no apparent nausea or vomiting Anesthetic complications: no   No complications documented.  Last Vitals:  Vitals:   04/03/20 1400 04/03/20 1415  BP: (!) 142/83 133/78  Pulse: 91 98  Resp: 16 19  Temp:  36.4 C  SpO2: 95% 96%    Last Pain:  Vitals:   04/03/20 1415  TempSrc:   PainSc: 0-No pain                 Jennilyn Esteve A.

## 2020-04-03 NOTE — Transfer of Care (Signed)
Immediate Anesthesia Transfer of Care Note  Patient: Terri Sparks  Procedure(s) Performed: CYSTOSCOPY WITH RIGHT  RETROGRADE PYELOGRAM, URETEROSCOPY WITH HOLMIUM LASER AND STENT EXCHANGE PLACEMENT (Right )  Patient Location: PACU  Anesthesia Type:General  Level of Consciousness: oriented, drowsy and patient cooperative  Airway & Oxygen Therapy: Patient Spontanous Breathing and Patient connected to face mask oxygen  Post-op Assessment: Report given to RN and Post -op Vital signs reviewed and stable  Post vital signs: Reviewed and stable  Last Vitals:  Vitals Value Taken Time  BP 165/97 04/03/20 1346  Temp 36.6 C 04/03/20 1346  Pulse 86 04/03/20 1348  Resp 15 04/03/20 1348  SpO2 99 % 04/03/20 1348  Vitals shown include unvalidated device data.  Last Pain:  Vitals:   04/03/20 1028  TempSrc: Oral         Complications: No complications documented.

## 2020-04-03 NOTE — Op Note (Addendum)
Preoperative diagnosis: right renal calculus  Postoperative diagnosis: right renal calculus  Procedure:  1. Cystoscopy 2. right ureteroscopy and stone removal 3. Ureteroscopic laser lithotripsy 4. right 77F x 24 ureteral stent placement  5. right retrograde pyelography with interpretation  Surgeon: Ardis Hughs, MD  Assistant: Carmie Kanner, MD  Anesthesia: General  Complications: None  Intraoperative findings: No evidence of residual stone in the right ureter. Small stone in inter and lower pole, fragmented with laser and basket extracted. Post procedure retrograde pyelogram without evidence of contrast extravasation.   EBL: Minimal  Specimens: None  Indication: Terri Sparks is a 44 y.o.  female patient with a stent previously placed for right ureteral stone. She returns today for definitive stone managment. After reviewing the management options for treatment, the patient elected to proceed with the above surgical procedure(s). We have discussed the potential benefits and risks of the procedure, side effects of the proposed treatment, the likelihood of the patient achieving the goals of the procedure, and any potential problems that might occur during the procedure or recuperation. Informed consent has been obtained.   Description of procedure:  The patient was taken to the operating room and general anesthesia was induced.  The patient was placed in the dorsal lithotomy position, prepped and draped in the usual sterile fashion, and preoperative antibiotics were administered. A preoperative time-out was performed.   Cystourethroscopy was performed.  The patient's urethra was examined and was normal. The bladder was then systematically examined in its entirety. There was no evidence for any bladder tumors, stones, or other mucosal pathology.    Attention then turned to the right ureteral orifice and a grasper was used to grab the existing stent, which was pulled to the  urethral meatus. The stent was cannulated with a sensor wire and removed. We then performed semi rigid ureteroscopy alongside the wire, which revealed no stone in the ureter. We then advanced a second wire through our scope and removed our ureteroscope. We then advanced a medium length 12/14Fr ureteral access sheath over one wire and removed the inner sheath and wire. We then performed flexible ureteroscopy, which revealed inter and lower polar stones.   The stone was then fragmented with the 365 micron holmium laser fiber on a setting of 0.6 and frequency of 6 Hz.   All stones were then removed from the calyces with an N-gage nitinol basket.  Reinspection of the ureter revealed no remaining visible stones or fragments.   The wire was then loaded over the wire and positioned appropriately under fluoroscopic guidance.  The wire was then removed with an adequate stent curl noted in the renal pelvis as well as in the bladder.  The bladder was then emptied and the procedure ended.  The patient appeared to tolerate the procedure well and without complications.  The patient was able to be awakened and transferred to the recovery unit in satisfactory condition.   Disposition: The tether of the stent was left on and tegaderm applied over top. Instructions for removing the stent have been provided to the patient. She was provided with a prescription for 7 days of keflex given urine culture results. The patient has been scheduled for followup in 6 weeks with a renal ultrasound.

## 2020-04-03 NOTE — H&P (Signed)
Urology Preoperative H&P   Chief Complaint: Right ureteral stone  History of Present Illness: Terri Sparks is a 44 y.o. female with a history of kidney stones.  She is most recently s/p right ureteral stent placement for obstructing 11mm distal ureteral stone and concern for infection on 03/03/20. She presents today for cystoscopy, right ureteroscopic stone extraction, possible stent removal vs exchange.   Past Medical History:  Diagnosis Date  . Anxiety   . Depression   . Diabetes ([place])    Type 2  . GERD (gastroesophageal reflux disease)   . History of blood clots    ,DVT-left leg, and early 2000, had blood clot behind left breast  . History of kidney stones   . Hypercalcemia   . Hyperparathyroidism   . Hypertension    had been on Lisinopril and PCP took her off it 4 months ago  . Iron deficiency anemia   . Left thyroid nodule   . Mass of right ovary   . Nephrolithiasis   . PAOD (peripheral arterial occlusive disease) ([place])   . Peripheral vascular disease ([place])   . Prolonged Q-T interval on ECG 04/19/2019  . Renal calculi   . Substance abuse ([place])   . Tooth loose    top tooth from the right  is loose     Past Surgical History:  Procedure Laterality Date  . ABDOMINAL AORTOGRAM W/LOWER EXTREMITY N/A 11/14/2017   Procedure: ABDOMINAL AORTOGRAM W/LOWER EXTREMITY;  Surgeon: Waynetta Sandy, MD;  Location: [facility];  Service: Cardiovascular;  Laterality: N/A;  . ANGIOPLASTY ILLIAC ARTERY Left 02/25/2018   Procedure: BALLOON ANGIOPLASTY LEFT ILIAC ARTERY;  Surgeon: Conrad Wallace, MD;  Location: [facility];  Service: Vascular;  Laterality: Left;  . APPLICATION OF WOUND VAC Left 03/03/2018   Procedure: APPLICATION OF WOUND VAC;  Surgeon: Angelia Mould, MD;  Location: [facility];  Service: Vascular;  Laterality: Left;  . CYSTOSCOPY W/ URETERAL STENT PLACEMENT Right 06/27/2018   Procedure: CYSTOSCOPY WITH RETROGRADE PYELOGRAM/URETERAL DOUBLE J STENT PLACEMENT;   Surgeon: Ceasar Mons, MD;  Location: [place];  Service: Urology;  Laterality: Right;  . CYSTOSCOPY WITH STENT PLACEMENT N/A 02/23/2019   Procedure: CYSTOSCOPY WITH RIGHT URETERAL STENT EXCHANGE/ LEFT URETERAL STENT PLACEMENT;  Surgeon: Ceasar Mons, MD;  [facility];  Service: Urology;  Laterality: N/A;  . CYSTOSCOPY WITH STENT PLACEMENT Right 06/11/2019   Procedure: CYSTOSCOPY,RIGHT RETROGRADE WITH STENT PLACEMENT;  Surgeon: Irine Seal, MD;  [facility];  Service: Urology;  Laterality: Right;  . CYSTOSCOPY WITH STENT PLACEMENT Right 03/03/2020   Procedure: CYSTOSCOPY WITH STENT PLACEMENT retroagrade pylerogram;  Surgeon: Ardis Hughs, MD;  [facility];  Service: Urology;  Laterality: Right;  . CYSTOSCOPY/RETROGRADE/URETEROSCOPY/STONE EXTRACTION WITH BASKET Bilateral 05/01/2019   Procedure: CYSTOSCOPY/URETEROSCOPY/STONE EXTRACTION / LASER LITHOTRIPSY, BILATERAL URETEROSCOPY WITH URETERAL STONE EXTRACTION AND STENT EXCHANGE;  Surgeon: Ceasar Mons, MD;  Location: [facility];  Service: Urology;  Laterality: Bilateral;  . CYSTOSCOPY/URETEROSCOPY/HOLMIUM LASER/STENT PLACEMENT Right 06/27/2019   Procedure: CYSTOSCOPY/URETEROSCOPY/HOLMIUM LASER/STENT EXCHANGE;  Surgeon: Ceasar Mons, MD;  [facility];  Service: Urology;  Laterality: Right;  . EMBOLECTOMY Left 02/24/2017   Procedure: Left Lower Extremity Embolectomy and Angiogram.;  Surgeon: Waynetta Sandy, MD;  Location: [facility];  Service: Vascular;  Laterality: Left;  . INSERTION OF ILIAC STENT  02/24/2017   Procedure: INSERTION OF Common ILIAC STENT;  Surgeon: Waynetta Sandy, MD;  Location: [facility];  Service: Vascular;;  .  INTRAOPERATIVE ARTERIOGRAM Left 02/25/2018   Procedure: INTRA OPERATIVE ARTERIOGRAM WITH LEFT LEG RUNOFF;  Surgeon: Conrad [place], MD;  Location: [facility];  Service: Vascular;  Laterality: Left;  . LOWER EXTREMITY ANGIOGRAM Left  02/24/2017   Procedure: Aortagram, Left lower extremity Run-off;  Surgeon: Waynetta Sandy, MD;  Location: [facility];  Service: Vascular;  Laterality: Left;  . PATCH ANGIOPLASTY Left 02/25/2018   Procedure: PATCH ANGIOPLASTY LEFT SUPERFICIAL FEMORAL ARTERY WITH BOVINE PATCH;  Surgeon: Conrad [place]ville, MD;  Location: [facility];  Service: Vascular;  Laterality: Left;  . PERCUTANEOUS VENOUS THROMBECTOMY,LYSIS WITH INTRAVASCULAR ULTRASOUND (IVUS) Left 05/12/2018   Procedure: MECHANICAL THROMBECTOMY LEFT LEG, BALLOON ANGIOPLASTY LEFT ANTERIOR TIBIAL ARTERY, AORTOGRAM WITH LEFT LEG RUNOFF;  Surgeon: Serafina Mitchell, MD;  Location: [facility];  Service: Vascular;  Laterality: Left;  . 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 THROMBECTOMY FEMORAL ARTERY Left 02/25/2018   Procedure: LEFT ILIAC AND POPLITEAL ARTERY THROMBECTOMY;  Surgeon: Conrad [place], MD;  Location: [facility];  Service: Vascular;  Laterality: Left;  . TUBAL LIGATION    . WOUND DEBRIDEMENT Left 03/03/2018   Procedure: DEBRIDEMENT WOUND;  Surgeon: Angelia Mould, MD;  Location: [facility];  Service: Vascular;  Laterality: Left;  . WOUND EXPLORATION Left 03/03/2018   Procedure: WOUND EXPLORATION;  Surgeon: Angelia Mould, MD;  Location: [facility];  Service: Vascular;  Laterality: Left;    Allergies:  Allergies  Allergen Reactions  . Ciprofloxacin Hcl Hives  . Dilaudid [Hydromorphone Hcl] Shortness Of Breath and Other (See Comments)    "Asystole," per pt report  . Macrobid [Nitrofurantoin Macrocrystal] Hives, Shortness Of Breath and Rash  . Other Hives, Shortness Of Breath and Rash    NO "-CILLINS"!!!  . Penicillins Shortness Of Breath    Had had cephalosporins without incident Has patient had a PCN reaction causing immediate rash, facial/tongue/throat swelling, SOB or lightheadedness with hypotension: Yes Has patient had a PCN reaction causing severe rash involving mucus membranes or skin necrosis: Unk Has patient had a PCN reaction that  required hospitalization: Unk Has patient had a PCN reaction occurring within the last 10 years: No If all of the above answers are "NO", then may proceed with Cephalosporin use.   . Sulfa Antibiotics Hives, Shortness Of Breath and Rash  . Lexapro [Escitalopram Oxalate] Other (See Comments)    "I just did not like it."    Family History  Problem Relation Age of Onset  . Depression Mother   . Diabetes Father   . Heart failure Father     Social History:  reports that she has been smoking cigarettes. She has a 7.50 pack-year smoking history. She has never used smokeless tobacco. She reports current alcohol use. She reports current drug use. Drugs: Marijuana and Cocaine.  ROS: A complete review of systems was performed.  All systems are negative except for pertinent findings as noted.  Physical Exam:  Vital signs in last 24 hours:   Constitutional:  Alert and oriented, No acute distress Cardiovascular: Regular rate and rhythm, No JVD Respiratory: Normal respiratory effort, Lungs clear bilaterally GI: Abdomen is soft, nontender, nondistended, no abdominal masses GU: No CVA tenderness Lymphatic: No lymphadenopathy Neurologic: Grossly intact, no focal deficits Psychiatric: Normal mood and affect  Laboratory Data:  Recent Labs    04/01/20 1332  WBC 11.5*  HGB 14.3  HCT 44.3  PLT 347    Recent Labs    04/01/20 1332  NA 138  K 4.6  CL 105  GLUCOSE 181*  BUN 12  CALCIUM 9.7  CREATININE 1.50*     No results found for this or any previous visit (from the past 24 hour(s)). Recent Results (from the past 240 hour(s))  SARS CORONAVIRUS 2 (TAT 6-24 HRS) Nasopharyngeal Nasopharyngeal Swab     Status: None   Collection Time: 04/01/20 12:56 PM   Specimen: Nasopharyngeal Swab  Result Value Ref Range Status   SARS Coronavirus 2 NEGATIVE NEGATIVE Final    Comment: (NOTE) SARS-CoV-2 target nucleic acids are NOT DETECTED.  The SARS-CoV-2 RNA is generally detectable in upper  and lower respiratory specimens during the acute phase of infection. Negative results do not preclude SARS-CoV-2 infection, do not rule out co-infections with other pathogens, and should not be used as the sole basis for treatment or other patient management decisions. Negative results must be combined with clinical observations, patient history, and epidemiological information. The expected result is Negative.  Fact Sheet for Patients: SugarRoll.be  Fact Sheet for Healthcare Providers: https://www.woods-mathews.com/  This test is not yet approved or cleared by the [place] FDA and  has been authorized for detection and/or diagnosis of SARS-CoV-2 by FDA under an Emergency Use Authorization (EUA). This EUA will remain  in effect (meaning this test can be used) for the duration of the COVID-19 declaration under Se ction 564(b)(1) of the Act, 21 U.S.C. section 360bbb-3(b)(1), unless the authorization is terminated or revoked sooner.  Performed at [facility], [place] [address] [place], [place] [address]   Urine culture     Status: Abnormal (Preliminary result)   Collection Time: 04/01/20  1:32 PM   Specimen: Urine, Clean Catch  Result Value Ref Range Status   Specimen Description   Final    URINE, CLEAN CATCH Performed at [facility], [place] [address] [place], [place] [address]    Special Requests   Final    NONE Performed at [facility], [place] [address], [place], [place] [address]    Culture >=100,000 COLONIES/mL GRAM NEGATIVE RODS (A)  Final   Report Status PENDING  Incomplete    Renal Function: Recent Labs    04/01/20 1332  CREATININE 1.50*   Estimated Creatinine Clearance: 60.5 mL/min (A) (by C-G formula based on SCr of 1.5 mg/dL (H)).  Radiologic Imaging: No results found.  I independently reviewed the above imaging studies.  Assessment and Plan Jancy K Qadir is a 44  y.o. female with 4 mm right distal ureteral calculus and stent in place.  The risks, benefits and alternatives of cystoscopy with RIGHT ureteroscopy, laser lithotripsy and ureteral stent placement was discussed the patient.  Risks included, but are not limited to: bleeding, urinary tract infection, ureteral injury/avulsion, ureteral stricture formation, retained stone fragments, the possibility that multiple surgeries may be required to treat the stone(s), MI, stroke, PE and the inherent risks of general anesthesia.  The patient voices understanding and wishes to proceed.

## 2020-04-03 NOTE — Interval H&P Note (Signed)
History and Physical Interval Note:  04/03/2020 11:41 AM  Terri Sparks  has presented today for surgery, with the diagnosis of RIGHT URETERAL STONE.  The various methods of treatment have been discussed with the patient and family. After consideration of risks, benefits and other options for treatment, the patient has consented to  Procedure(s): CYSTOSCOPY WITH RIGHT  RETROGRADE PYELOGRAM, URETEROSCOPY WIHT HOLMIUM LASERAND STENT EXCHANGE PLACEMENT (Right) as a surgical intervention.  The patient's history has been reviewed, patient examined, no change in status, stable for surgery.  I have reviewed the patient's chart and labs.  Questions were answered to the patient's satisfaction.     Ardis Hughs

## 2020-04-04 ENCOUNTER — Encounter (HOSPITAL_COMMUNITY): Payer: Self-pay | Admitting: Urology

## 2020-04-04 ENCOUNTER — Other Ambulatory Visit: Payer: Self-pay | Admitting: Urology

## 2020-04-04 LAB — URINE CULTURE: Culture: >=100000 — AB

## 2020-04-04 MED ORDER — LEVOFLOXACIN 250 MG PO TABS
250.0000 mg | ORAL_TABLET | Freq: Every day | ORAL | 0 refills | Status: AC
Start: 2020-04-04 — End: 2020-04-11

## 2020-04-29 ENCOUNTER — Telehealth: Payer: Self-pay | Admitting: Internal Medicine

## 2020-04-29 DIAGNOSIS — E1129 Type 2 diabetes mellitus with other diabetic kidney complication: Secondary | ICD-10-CM

## 2020-04-29 NOTE — Telephone Encounter (Signed)
Attempted to return call to pt.  Unable to leave a vm, as mailbox is full.

## 2020-04-29 NOTE — Telephone Encounter (Signed)
Medication Refill - Medication: insulin aspart (NOVOLOG) 100 UNIT/ML injection, insulin glargine (LANTUS) 100 UNIT/ML injection ,hydrOXYzine (ATARAX/VISTARIL) 50 MG tablet ,gabapentin (NEURONTIN) 400 MG capsule,WARFARIN,amLODipine (NORVASC) 5 MG tablet ,carvedilol (COREG) 3.125 MG tablet ,buPROPion (WELLBUTRIN SR) 150 MG 12 hr tablet , NICOTINE patches (Patient is asking that nurse call her to go over medication with her that she is due for because she is unsure if she is leaving medications out)  Has the patient contacted their pharmacy?yes (Agent: If no, request that the patient contact the pharmacy for the refill.) (Agent: If yes, when and what did the pharmacy advise?)Contact PCP  Preferred Pharmacy (with phone number or street name):  [facility], [address] Phone:  [phone]  Fax:  [phone]       Agent: Please be advised that RX refills may take up to 3 business days. We ask that you follow-up with your pharmacy.

## 2020-04-30 ENCOUNTER — Other Ambulatory Visit: Payer: Self-pay | Admitting: Internal Medicine

## 2020-04-30 MED ORDER — INSULIN ASPART 100 UNIT/ML ~~LOC~~ SOLN: 5.0000 [IU] | Freq: Three times a day (TID) | SUBCUTANEOUS | 6 refills | Status: DC

## 2020-04-30 MED ORDER — INSULIN ASPART 100 UNIT/ML ~~LOC~~ SOLN: 5.0000 [IU] | Freq: Three times a day (TID) | SUBCUTANEOUS | 3 refills | Status: DC

## 2020-04-30 MED ORDER — INSULIN GLARGINE 100 UNIT/ML ~~LOC~~ SOLN: 65.0000 [IU] | Freq: Every day | SUBCUTANEOUS | 6 refills | Status: DC

## 2020-04-30 MED FILL — CARVEDILOL 3.125 MG TABLET: 3.125 | 30 days supply | Qty: 60 | Fill #1

## 2020-04-30 MED FILL — NICOTINE 14 MG/24HR PATCH: 14 | 28 days supply | Qty: 28 | Fill #0

## 2020-04-30 MED FILL — GABAPENTIN 400 MG CAPSULE: 400 | 30 days supply | Qty: 90 | Fill #1

## 2020-04-30 MED FILL — NovoLOG 100 UNIT/ML SOLN: 100 | 22 days supply | Qty: 10 | Fill #0

## 2020-04-30 MED FILL — BUPROPION SR 150 MG TABLET: 150 | 30 days supply | Qty: 60 | Fill #3

## 2020-04-30 MED FILL — LANTUS 100 UNITS/ML VIAL: 100 | 30 days supply | Qty: 20 | Fill #0

## 2020-04-30 MED FILL — hydrOXYzine HCL 50 MG TABS: 50 | 30 days supply | Qty: 90 | Fill #0

## 2020-04-30 MED FILL — AMLODIPINE BESYLATE 5 MG TA: 5 | 30 days supply | Qty: 30 | Fill #3

## 2020-04-30 NOTE — Telephone Encounter (Signed)
Phone call to [place] at [facility].  Reviewed medication list that pt. Is requesting to have refilled.  Per Claiborne Billings the pt. Did not pick up refills in June on Lantus, Hydroxyzine, or Nicotine Patches.  Stated she will refill list of meds as requested, and send pt. Automated message when refills are ready.  Claiborne Billings stated they will need new script on Novolog Insulin.

## 2020-04-30 NOTE — Telephone Encounter (Signed)
Requested Prescriptions  Pending Prescriptions Disp Refills  . insulin aspart (NOVOLOG) 100 UNIT/ML injection 10 mL 3    Sig: Inject 5-15 Units into the skin 3 (three) times daily before meals.     Endocrinology:  Diabetes - Insulins Failed - 04/30/2020 10:23 AM      Failed - HBA1C is between 0 and 7.9 and within 180 days    HbA1c, POC (controlled diabetic range)  Date Value Ref Range Status  06/20/2018 8.7 (A) 0.0 - 7.0 % Final   Hgb A1c MFr Bld  Date Value Ref Range Status  04/01/2020 8.9 (H) 4.8 - 5.6 % Final    Comment:    (NOTE)         Prediabetes: 5.7 - 6.4         Diabetes: >6.4         Glycemic control for adults with diabetes: <7.0          Passed - Valid encounter within last 6 months    Recent Outpatient Visits          1 month ago Type 2 diabetes mellitus with microalbuminuria, with long-term current use of insulin ([place])   [place] Ladell Pier, MD   3 months ago Preoperative evaluation to rule out surgical contraindication   [place], Deborah B, MD   5 months ago 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

## 2020-04-30 NOTE — Addendum Note (Signed)
Addended by: Karle Plumber B on: 04/30/2020 11:31 AM   Modules accepted: Orders

## 2020-04-30 NOTE — Telephone Encounter (Signed)
I have attempted to contact this patient multiple times to schedule an appointment for anti-coag management. Her VM is full when I attempt to leave a message. I have not received any messages from her. I am including [place] in this thread.   Gaet[facility] Net -   Can you have someone attempt to contact her to schedule an anticoag appt with me? She will need to be started on warfarin with Lovenox and that will require a Coumadin Clinic appt with me.

## 2020-04-30 NOTE — Telephone Encounter (Signed)
Phone call to pt.  Advised that I was returning her call to review what medications she was requesting for refill.  Questioned pt. About Warfarin.  Stated she has not had any Eliquis or Warfarin since May.  In reviewing office note 03/24/20, Dr. Wynetta Emery had advised that the office would arrange an appt. With [place] after 04/03/20, to switch from Eliquis to Warfarin.  The pt. Stated she has left 2 messages for [facility] on his voicemail, but has not rec'd a return call.  In reviewing further the list of medications pt. Is requesting refill on, I advised that the Novolog and Lantus are due for new refill order, but that she should have refills on Hydroxyzine, Gabapentin, Amlodipine, Carvedilol, and Bupropion.  The pt. stated "that's why I called yesterday, was to ask to have those refilled."  Offered to have pt. Transferred to the pharmacy, to request refills.  In attempting to make the transfer, the pt. Disconnected the call.  Nurse attempted to call pt. back right away, and got recording that voice mail was full.

## 2020-04-30 NOTE — Telephone Encounter (Signed)
FYI- Dr. Wynetta Emery, pt. Stated she has not taken any Eliquis or Warfarin, since May.  See note attached.

## 2020-05-01 NOTE — Telephone Encounter (Signed)
Tried to contact patient to schedule an appointment and her voice mail was full. I could not leave a message.

## 2020-05-13 ENCOUNTER — Other Ambulatory Visit: Payer: Self-pay | Admitting: Pharmacist

## 2020-05-13 MED ORDER — SUCRALFATE 1 GM/10ML PO SUSP: 1.0000 g | Freq: Two times a day (BID) | ORAL | 0 refills | Status: DC

## 2020-05-13 MED ORDER — WARFARIN SODIUM 5 MG PO TABS
ORAL_TABLET | ORAL | 2 refills | Status: DC
Start: 2020-05-13 — End: 2020-12-11

## 2020-05-13 MED FILL — SERTRALINE HCL 50 MG TABLET: 50 | 30 days supply | Qty: 30 | Fill #3

## 2020-05-13 MED FILL — NovoLOG 100 UNIT/ML SOLN: 100 | 22 days supply | Qty: 10 | Fill #0

## 2020-05-13 MED FILL — AMLODIPINE BESYLATE 5 MG TA: 5 | 30 days supply | Qty: 30 | Fill #3

## 2020-05-13 MED FILL — SUCRALFATE 1 GM/10ML SUSP: 1 | 21 days supply | Qty: 420 | Fill #0

## 2020-05-13 MED FILL — WARFARIN SODIUM 5 MG TABLET: 5 | 30 days supply | Qty: 60 | Fill #0

## 2020-05-13 MED FILL — CARVEDILOL 3.125 MG TABLET: 3.125 | 30 days supply | Qty: 60 | Fill #1

## 2020-05-13 MED FILL — LANTUS 100 UNITS/ML VIAL: 100 | 30 days supply | Qty: 20 | Fill #0

## 2020-05-13 MED FILL — hydrOXYzine HCL 50 MG TABS: 50 | 30 days supply | Qty: 90 | Fill #0

## 2020-05-13 MED FILL — NICOTINE 14 MG/24HR PATCH: 14 | 28 days supply | Qty: 28 | Fill #0

## 2020-05-13 MED FILL — GABAPENTIN 400 MG CAPSULE: 400 | 30 days supply | Qty: 90 | Fill #1

## 2020-05-13 MED FILL — ATORVASTATIN 10 MG TABLET: 10 | 30 days supply | Qty: 30 | Fill #1

## 2020-05-13 MED FILL — BUPROPION SR 150 MG TABLET: 150 | 30 days supply | Qty: 60 | Fill #3

## 2020-05-14 NOTE — Progress Notes (Deleted)
Warfarin restart  ***bridging [place]  Pharmacy Anticoagulation Clinic  Subjective: Patient presents today for warfarin re-start. 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  Pt s/p ureteral stone removal surgery on 04/03/20. Pt was discharged on apixaban 5 mg BID, planned to meet with PharmD to switch to warfarin but pt lost to follow up. On 04/29/20 telephone call with RN, pt stated she has not had any apxaban or warfarin since May.  Current dose of warfarin:  Adherence to warfarin:  Signs/symptoms of bleeding: Recent changes in diet: Recent changes in medications: Upcoming procedures that may impact anticoagulation:  Potential drug-drug interactions: Diet: Alcohol:  Objective: Today's INR =   Lab Results  Component Value Date   INR 1.1 09/24/2019   INR 0.9 09/22/2019   INR 1.6 (A) 08/16/2019   BMI > 30  Assessment and Plan: Anticoagulation:  - Start warfarin 5 mg daily - Start lovenox  mg daily and continue until INR >  2 and on warfarin for at least 5 days  Patient is  based on patient's INR of *** and patient's INR goal of 2-3.  Patient verbalized understanding and was provided with written instructions. Next INR check in 4 days.

## 2020-05-15 ENCOUNTER — Ambulatory Visit: Payer: Medicaid Other | Admitting: Pharmacist

## 2020-05-21 ENCOUNTER — Other Ambulatory Visit: Payer: Self-pay | Admitting: Internal Medicine

## 2020-05-21 MED FILL — TRUEPLUS SYR 1ML 31GX5/16: 31G X 5/16" | 25 days supply | Qty: 100 | Fill #0

## 2020-05-21 MED FILL — WARFARIN SODIUM 5 MG TABLET: 5 | 30 days supply | Qty: 60 | Fill #0

## 2020-05-21 MED FILL — SUCRALFATE 1 GM/10ML SUSP: 1 | 21 days supply | Qty: 420 | Fill #0

## 2020-05-22 ENCOUNTER — Other Ambulatory Visit: Payer: Self-pay | Admitting: Internal Medicine

## 2020-05-22 ENCOUNTER — Other Ambulatory Visit: Payer: Self-pay

## 2020-05-22 ENCOUNTER — Telehealth: Payer: Self-pay | Admitting: Internal Medicine

## 2020-05-22 DIAGNOSIS — R079 Chest pain, unspecified: Secondary | ICD-10-CM

## 2020-05-22 MED ORDER — NITROGLYCERIN 0.4 MG SL SUBL: 0.4000 mg | SUBLINGUAL_TABLET | SUBLINGUAL | 2 refills | Status: DC | PRN

## 2020-05-22 MED FILL — NITROGLYCERIN 0.4 MG TAB SL: 0.4 | 20 days supply | Qty: 25 | Fill #0

## 2020-05-22 NOTE — Telephone Encounter (Signed)
*  STAT* If patient is at the pharmacy, call can be transferred to refill team.   1. Which medications need to be refilled? (please list name of each medication and dose if known) nitroGLYCERIN (NITROSTAT) 0.4 MG SL tablet  2. Which pharmacy/location (including street and city if local pharmacy) is medication to be sent to? [facility] [place], [place], [place] [address]  3. Do they need a 30 day or 90 day supply? 30 day supply

## 2020-05-23 MED ORDER — NITROGLYCERIN 0.4 MG SL SUBL: 0.4000 mg | SUBLINGUAL_TABLET | SUBLINGUAL | 2 refills | Status: DC | PRN

## 2020-05-23 NOTE — Telephone Encounter (Signed)
Follow up   Pt is calling, she said she cant go to the [facility], [address] to get nitroGLYCERIN (NITROSTAT) 0.4 MG SL tablet [facility]  - [address], [place], [place] [id number] as requested below.

## 2020-05-23 NOTE — Telephone Encounter (Signed)
[facility] as requested.

## 2020-05-27 ENCOUNTER — Telehealth: Payer: Self-pay | Admitting: Obstetrics and Gynecology

## 2020-05-27 NOTE — Telephone Encounter (Signed)
Patient called and requested her visit be a virtual visit. Spoke with Carver Fila CMA.

## 2020-05-29 ENCOUNTER — Other Ambulatory Visit: Payer: Self-pay

## 2020-05-29 ENCOUNTER — Encounter: Payer: Self-pay | Admitting: Obstetrics and Gynecology

## 2020-05-29 ENCOUNTER — Telehealth (INDEPENDENT_AMBULATORY_CARE_PROVIDER_SITE_OTHER): Payer: Medicaid Other | Admitting: Obstetrics and Gynecology

## 2020-05-29 DIAGNOSIS — N939 Abnormal uterine and vaginal bleeding, unspecified: Secondary | ICD-10-CM

## 2020-05-29 DIAGNOSIS — Z1331 Encounter for screening for depression: Secondary | ICD-10-CM

## 2020-05-29 NOTE — Progress Notes (Addendum)
Patient contacted via telephone for virtual appointment. Unable to connect virtually with provider. Patient notified that she will be rescheduled for an in person appointment with provider and called once appointment was scheduled.   Orlin Hilding, RN

## 2020-05-29 NOTE — Progress Notes (Signed)
Unable to connect with pt via my chart. Pt will be rescheduled for face to face appt to discuss surgery

## 2020-06-23 MED FILL — BUPROPION SR 150 MG TABLET: 150 | 30 days supply | Qty: 60 | Fill #4

## 2020-06-23 MED FILL — GABAPENTIN 400 MG CAPSULE: 400 | 30 days supply | Qty: 90 | Fill #2

## 2020-06-23 MED FILL — WARFARIN SODIUM 5 MG TABLET: 5 | 30 days supply | Qty: 60 | Fill #1

## 2020-06-23 MED FILL — hydrOXYzine HCL 50 MG TABS: 50 | 30 days supply | Qty: 90 | Fill #1

## 2020-06-23 MED FILL — CARVEDILOL 3.125 MG TABLET: 3.125 | 30 days supply | Qty: 60 | Fill #2

## 2020-06-23 MED FILL — AMLODIPINE BESYLATE 5 MG TA: 5 | 30 days supply | Qty: 30 | Fill #4

## 2020-06-23 MED FILL — NovoLOG 100 UNIT/ML SOLN: 100 | 22 days supply | Qty: 10 | Fill #1

## 2020-06-23 MED FILL — ATORVASTATIN 10 MG TABLET: 10 | 30 days supply | Qty: 30 | Fill #2

## 2020-06-23 MED FILL — LANTUS 100 UNITS/ML VIAL: 100 | 15 days supply | Qty: 10 | Fill #1

## 2020-06-23 MED FILL — TRUEPLUS SYR 1ML 31GX5/16: 31G X 5/16" | 25 days supply | Qty: 100 | Fill #0

## 2020-07-03 ENCOUNTER — Ambulatory Visit (INDEPENDENT_AMBULATORY_CARE_PROVIDER_SITE_OTHER): Payer: Medicaid Other | Admitting: Obstetrics and Gynecology

## 2020-07-03 ENCOUNTER — Ambulatory Visit: Payer: Medicaid Other | Attending: Internal Medicine | Admitting: Pharmacist

## 2020-07-03 ENCOUNTER — Other Ambulatory Visit: Payer: Self-pay

## 2020-07-03 ENCOUNTER — Other Ambulatory Visit: Payer: Self-pay | Admitting: Internal Medicine

## 2020-07-03 ENCOUNTER — Encounter: Payer: Self-pay | Admitting: Obstetrics and Gynecology

## 2020-07-03 VITALS — BP 140/83 | HR 90 | Ht 67.0 in | Wt 267.0 lb

## 2020-07-03 DIAGNOSIS — F419 Anxiety disorder, unspecified: Secondary | ICD-10-CM

## 2020-07-03 DIAGNOSIS — N939 Abnormal uterine and vaginal bleeding, unspecified: Secondary | ICD-10-CM

## 2020-07-03 DIAGNOSIS — I749 Embolism and thrombosis of unspecified artery: Secondary | ICD-10-CM

## 2020-07-03 DIAGNOSIS — I745 Embolism and thrombosis of iliac artery: Secondary | ICD-10-CM

## 2020-07-03 LAB — POCT INR: INR: 1 — AB (ref 2.0–3.0)

## 2020-07-03 MED ORDER — ACCU-CHEK GUIDE VI STRP: ORAL_STRIP | 2 refills | Status: DC

## 2020-07-03 MED ORDER — MEGESTROL ACETATE 40 MG PO TABS: 40.0000 mg | ORAL_TABLET | Freq: Two times a day (BID) | ORAL | 5 refills | Status: DC

## 2020-07-03 MED ORDER — SERTRALINE HCL 50 MG PO TABS: 50.0000 mg | ORAL_TABLET | Freq: Every day | ORAL | 2 refills | Status: DC

## 2020-07-03 MED FILL — MEGESTROL 40 MG TABLET: 40 | 30 days supply | Qty: 60 | Fill #0

## 2020-07-03 MED FILL — ACCU-CHEK GUIDE TEST STRIP: 33 days supply | Qty: 100 | Fill #0

## 2020-07-03 MED FILL — SERTRALINE HCL 50 MG TABLET: 50 | 90 days supply | Qty: 90 | Fill #0

## 2020-07-03 NOTE — Progress Notes (Signed)
Pt signed Hysterectomy Statement-07/03/20

## 2020-07-03 NOTE — Progress Notes (Signed)
Terri Sparks presents to schedule her surgery. TVH possible salpingectomy d/t to AUB. U/S and EMBX normal Still episodes of bleeding with Megace at times On Coumadin and followed by PCP.  PE AF VSS Lungs clear Heart RRR Abd soft + BS obese GU nl EGBUS  Uterus small mobile < 10 weeks non tender no adnexal masses exam limited by pt habitus  A/P AUB        Will continue with Megace for now. Schedule surgery. Coumadin management and Lovenox bridge per PCP. Information on hysterectomy and after care provided to pt. F/U with post op.

## 2020-07-03 NOTE — Patient Instructions (Signed)
Vaginal Hysterectomy, Care After Refer to this sheet in the next few weeks. These instructions provide you with information about caring for yourself after your procedure. Your health care provider may also give you more specific instructions. Your treatment has been planned according to current medical practices, but problems sometimes occur. Call your health care provider if you have any problems or questions after your procedure. What can I expect after the procedure? After the procedure, it is common to have:  Pain.  Soreness and numbness in your incision areas.  Vaginal bleeding and discharge.  Constipation.  Temporary problems emptying the bladder.  Feelings of sadness or other emotions. Follow these instructions at home: Medicines  Take over-the-counter and prescription medicines only as told by your health care provider.  If you were prescribed an antibiotic medicine, take it as told by your health care provider. Do not stop taking the antibiotic even if you start to feel better.  Do not drive or operate heavy machinery while taking prescription pain medicine. Activity  Return to your normal activities as told by your health care provider. Ask your health care provider what activities are safe for you.  Get regular exercise as told by your health care provider. You may be told to take short walks every day and go farther each time.  Do not lift anything that is heavier than 10 lb (4.5 kg). General instructions   Do not put anything in your vagina for 6 weeks after your surgery or as told by your health care provider. This includes tampons and douches.  Do not have sex until your health care provider says you can.  Do not take baths, swim, or use a hot tub until your health care provider approves.  Drink enough fluid to keep your urine clear or pale yellow.  Do not drive for 24 hours if you were given a sedative.  Keep all follow-up visits as told by your health  care provider. This is important. Contact a health care provider if:  Your pain medicine is not helping.  You have a fever.  You have redness, swelling, or pain at your incision site.  You have blood, pus, or a bad-smelling discharge from your vagina.  You continue to have difficulty urinating. Get help right away if:  You have severe abdominal or back pain.  You have heavy bleeding from your vagina.  You have chest pain or shortness of breath. This information is not intended to replace advice given to you by your health care provider. Make sure you discuss any questions you have with your health care provider. Document Revised: 05/13/2016 Document Reviewed: 10/05/2015 Elsevier Patient Education  2020 [place]. Vaginal Hysterectomy  A vaginal hysterectomy is a procedure to remove all or part of the uterus through a small incision in the vagina. In this procedure, your health care provider may remove your entire uterus, including the lower end (cervix). You may need a vaginal hysterectomy to treat:  Uterine fibroids.  A condition that causes the lining of the uterus to grow in other areas (endometriosis).  Problems with pelvic support.  Cancer of the cervix, ovaries, uterus, or tissue that lines the uterus (endometrium).  Excessive (dysfunctional) uterine bleeding. When removing your uterus, your health care provider may also remove the organs that produce eggs (ovaries) and the tubes that carry eggs to your uterus (fallopian tubes). After a vaginal hysterectomy, you will no longer be able to have a baby. You will also no longer get your  menstrual period. Tell a health care provider about:  Any allergies you have.  All medicines you are taking, including vitamins, herbs, eye drops, creams, and over-the-counter medicines.  Any problems you or family members have had with anesthetic medicines.  Any blood disorders you have.  Any surgeries you have had.  Any medical  conditions you have.  Whether you are pregnant or may be pregnant. What are the risks? Generally, this is a safe procedure. However, problems may occur, including:  Bleeding.  Infection.  A blood clot that forms in your leg and travels to your lungs (pulmonary embolism).  Damage to surrounding organs.  Pain during sex. What happens before the procedure?  Ask your health care provider what organs will be removed during surgery.  Ask your health care provider about: ? Changing or stopping your regular medicines. This is especially important if you are taking diabetes medicines or blood thinners. ? Taking medicines such as aspirin and ibuprofen. These medicines can thin your blood. Do not take these medicines before your procedure if your health care provider instructs you not to.  Follow instructions from your health care provider about eating or drinking restrictions.  Do not use any tobacco products, such as cigarettes, chewing tobacco, and e-cigarettes. If you need help quitting, ask your health care provider.  Plan to have someone take you home after discharge from the hospital. What happens during the procedure?  To reduce your risk of infection: ? Your health care team will wash or sanitize their hands. ? Your skin will be washed with soap.  An IV tube will be inserted into one of your veins.  You may be given antibiotic medicine to help prevent infection.  You will be given one or more of the following: ? A medicine to help you relax (sedative). ? A medicine to numb the area (local anesthetic). ? A medicine to make you fall asleep (general anesthetic). ? A medicine that is injected into an area of your body to numb everything beyond the injection site (regional anesthetic).  Your surgeon will make an incision in your vagina.  Your surgeon will locate and remove all or part of your uterus.  Your ovaries and fallopian tubes may be removed at the same time.  The  incision will be closed with stitches (sutures) that dissolve over time. The procedure may vary among health care providers and hospitals. What happens after the procedure?  Your blood pressure, heart rate, breathing rate, and blood oxygen level will be monitored often until the medicines you were given have worn off.  You will be encouraged to get up and walk around after a few hours to help prevent complications.  You may have IV tubes in place for a few days.  You will be given pain medicine as needed.  Do not drive for 24 hours if you were given a sedative. This information is not intended to replace advice given to you by your health care provider. Make sure you discuss any questions you have with your health care provider. Document Revised: 05/13/2016 Document Reviewed: 10/05/2015 Elsevier Patient Education  2020 [employer].

## 2020-07-09 MED FILL — NITROGLYCERIN 0.4 MG TAB SL: 0.4 | 20 days supply | Qty: 25 | Fill #0

## 2020-07-09 MED FILL — LANTUS 100 UNITS/ML VIAL: 100 | 30 days supply | Qty: 20 | Fill #0

## 2020-07-10 ENCOUNTER — Ambulatory Visit: Payer: Medicaid Other | Attending: Internal Medicine | Admitting: Pharmacist

## 2020-07-10 ENCOUNTER — Encounter: Payer: Self-pay | Admitting: Pharmacist

## 2020-07-10 ENCOUNTER — Other Ambulatory Visit: Payer: Self-pay | Admitting: Pharmacist

## 2020-07-10 ENCOUNTER — Other Ambulatory Visit: Payer: Self-pay

## 2020-07-10 DIAGNOSIS — I749 Embolism and thrombosis of unspecified artery: Secondary | ICD-10-CM

## 2020-07-10 DIAGNOSIS — F17200 Nicotine dependence, unspecified, uncomplicated: Secondary | ICD-10-CM

## 2020-07-10 DIAGNOSIS — I745 Embolism and thrombosis of iliac artery: Secondary | ICD-10-CM

## 2020-07-10 LAB — POCT INR: INR: 1.6 — AB (ref 2.0–3.0)

## 2020-07-10 MED ORDER — NICOTINE 14 MG/24HR TD PT24: 14.0000 mg | MEDICATED_PATCH | Freq: Every day | TRANSDERMAL | 2 refills | Status: DC

## 2020-07-10 MED ORDER — HYDROXYZINE HCL 50 MG PO TABS: 50.0000 mg | ORAL_TABLET | Freq: Three times a day (TID) | ORAL | 4 refills | Status: DC | PRN

## 2020-07-10 MED FILL — NovoLOG 100 UNIT/ML SOLN: 100 | 22 days supply | Qty: 10 | Fill #2

## 2020-07-10 MED FILL — NICOTINE 14 MG/24HR PATCH: 14 | 28 days supply | Qty: 28 | Fill #0

## 2020-07-10 MED FILL — ACCU-CHEK GUIDE TEST STRIP: 33 days supply | Qty: 100 | Fill #0

## 2020-07-10 MED FILL — HYDROXYZINE PAM 50 MG CAP: 50 | 30 days supply | Qty: 90 | Fill #0

## 2020-07-14 NOTE — BH Specialist Note (Addendum)
error 

## 2020-07-16 ENCOUNTER — Encounter (HOSPITAL_COMMUNITY): Payer: Self-pay | Admitting: Psychiatry

## 2020-07-16 ENCOUNTER — Other Ambulatory Visit (HOSPITAL_COMMUNITY): Payer: Self-pay | Admitting: Psychiatry

## 2020-07-16 ENCOUNTER — Other Ambulatory Visit: Payer: Self-pay

## 2020-07-16 ENCOUNTER — Ambulatory Visit: Payer: Medicaid Other | Attending: Internal Medicine | Admitting: Pharmacist

## 2020-07-16 ENCOUNTER — Ambulatory Visit (INDEPENDENT_AMBULATORY_CARE_PROVIDER_SITE_OTHER): Payer: Medicaid Other | Admitting: Psychiatry

## 2020-07-16 VITALS — BP 138/72 | HR 86 | Ht 67.0 in | Wt 270.0 lb

## 2020-07-16 DIAGNOSIS — F339 Major depressive disorder, recurrent, unspecified: Secondary | ICD-10-CM | POA: Diagnosis not present

## 2020-07-16 DIAGNOSIS — I745 Embolism and thrombosis of iliac artery: Secondary | ICD-10-CM

## 2020-07-16 DIAGNOSIS — I749 Embolism and thrombosis of unspecified artery: Secondary | ICD-10-CM | POA: Diagnosis not present

## 2020-07-16 DIAGNOSIS — F411 Generalized anxiety disorder: Secondary | ICD-10-CM

## 2020-07-16 DIAGNOSIS — F1420 Cocaine dependence, uncomplicated: Secondary | ICD-10-CM | POA: Diagnosis not present

## 2020-07-16 DIAGNOSIS — F1220 Cannabis dependence, uncomplicated: Secondary | ICD-10-CM | POA: Diagnosis not present

## 2020-07-16 LAB — POCT INR: INR: 2.9 (ref 2.0–3.0)

## 2020-07-16 MED ORDER — CLONAZEPAM 0.5 MG PO TABS: 0.5000 mg | ORAL_TABLET | Freq: Two times a day (BID) | ORAL | 1 refills | Status: DC

## 2020-07-16 MED ORDER — BUPROPION HCL ER (SR) 150 MG PO TB12: 150.0000 mg | ORAL_TABLET | Freq: Two times a day (BID) | ORAL | 1 refills | Status: DC

## 2020-07-16 MED ORDER — HYDROXYZINE HCL 50 MG PO TABS: 50.0000 mg | ORAL_TABLET | Freq: Three times a day (TID) | ORAL | 1 refills | Status: DC | PRN

## 2020-07-16 MED ORDER — SERTRALINE HCL 100 MG PO TABS: 100.0000 mg | ORAL_TABLET | Freq: Every day | ORAL | 1 refills | Status: DC

## 2020-07-16 MED FILL — SERTRALINE HCL 100 MG TAB: 100 | 30 days supply | Qty: 30 | Fill #0

## 2020-07-16 NOTE — Progress Notes (Signed)
Psychiatric Initial Adult Assessment   Patient Identification: Terri Sparks MRN:  [id number] Date of Evaluation:  07/16/2020   Referral Source: Dr. Wynetta Emery  Chief Complaint:   Chief Complaint    New Patient (Initial Visit)     Visit Diagnosis:    ICD-10-CM   1. Major depressive disorder, recurrent episode with anxious distress (HCC)  F33.9 sertraline (ZOLOFT) 100 MG tablet    buPROPion (WELLBUTRIN SR) 150 MG 12 hr tablet  2. Anxiety state  F41.1 hydrOXYzine (ATARAX/VISTARIL) 50 MG tablet    sertraline (ZOLOFT) 100 MG tablet    clonazePAM (KLONOPIN) 0.5 MG tablet  3. Cocaine use disorder, moderate, dependence (HCC)  F14.20   4. Cannabis use disorder, moderate, dependence (HCC)  F12.20     History of Present Illness: This is a 44 year old female with history of MDD, anxiety, polysubstance abuse now seen for evaluation after being referred by her PCP.  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   Patient has a long history of polysubstance abuse mainly crack cocaine, marijuana and in the past opioids. Patient is scheduled to undergo major surgical procedure hysterectomy end of next month and her providers want her to discontinue her crack cocaine use. Patient reported that she uses crack cocaine almost daily mainly because of her overwhelming anxiety.  She stated that it helps her to calm the anxiety for short periods of time and that is why she keeps using it. She stated that she has a lot of stressors including her health conditions which are overwhelming.  She informed that her mother committed suicide in 2013 and patient never processed it properly. She stated that due to her chronic pain conditions she used to use pain pills however she stopped a few months ago and now is prescribed  gabapentin by her PCP.  She stated that the dose is not enough and she has asked the PCP to increase the dose but they have not done so.  She reported that she has frequent crying spells with low energy levels and anhedonia.  She does not feel like doing anything at times.  She reported that she feels very anxious with the fear of having another panic attack.  She is scared to go anywhere including for grocery shopping.  She is worried that she may have another episode of anxiety.  She also reported feeling very irritable at times. She stated that she has had thoughts like why she is still alive and she will be better off dead across her mind but she will never hurt herself as she loves her children a lot.  She also denied any past suicide attempts. She informed that she takes hydroxyzine 50 mg tablets but they do not help much for anxiety and mainly help for itching.  However when she takes hydroxyzine 50 mg capsules they work better for anxiety but do not last for too long.  As a result she takes both 50 mg tablets and 50 mg capsules during the day but even then finds herself very anxious. She stated that during this overwhelming anxiety she ends up snorting crack cocaine almost daily.  She stated that she knows that it is detrimental to her health given her heart conditions however she continues to do it because of the anxiety being crippling.  She stated that in the past she is taking Ativan  and Klonopin with good results and stated that if she is prescribed 1 of these she will stop abusing crack cocaine in the future.  She informed that she no longer misuses any pain pills.  She still does smoke marijuana pretty regularly and believes that is not really drug and is more like a herb.  Patient is fully aware of the fact that she is not supposed to be using any illicit substances without prescription prior to her surgical procedures.  Based on patient's history and evaluation, writer recommended that we  go up on her dose of Zoloft for optimal control of depression anxiety symptoms.   Writer discussed the options of trying buspirone instead of hydroxyzine for optimal control of anxiety however patient is reported that buspirone caused her to have facial tics.  She stated that if she does get prescribed a low-dose of Klonopin or her Ativan she will stop using crack cocaine.    Past Psychiatric History: MDD, anxiety, polysubstance abuse  Previous Psychotropic Medications: Yes   Substance Abuse History in the last 12 months:  Yes.    Consequences of Substance Abuse: Medical Consequences:  Needs medical care  Past Medical History:  Past Medical History:  Diagnosis Date  . Anxiety   . Depression   . Diabetes ([place])    Type 2  . GERD (gastroesophageal reflux disease)   . History of blood clots    ,DVT-left leg, and early 2000, had blood clot behind left breast  . History of kidney stones   . Hypercalcemia   . Hyperparathyroidism   . Hypertension    had been on Lisinopril and PCP took her off it 4 months ago  . Iron deficiency anemia   . Left thyroid nodule   . Mass of right ovary   . Nephrolithiasis   . PAOD (peripheral arterial occlusive disease) ([place])   . Peripheral vascular disease ([place])   . Prolonged Q-T interval on ECG 04/19/2019  . Renal calculi   . Substance abuse ([place])   . Tooth loose    top tooth from the right  is loose     Past Surgical History:  Procedure Laterality Date  . ABDOMINAL AORTOGRAM W/LOWER EXTREMITY N/A 11/14/2017   Procedure: ABDOMINAL AORTOGRAM W/LOWER EXTREMITY;  Surgeon: Waynetta Sandy, MD;  Location: [facility];  Service: Cardiovascular;  Laterality: N/A;  . ANGIOPLASTY ILLIAC ARTERY Left 02/25/2018   Procedure: BALLOON ANGIOPLASTY LEFT ILIAC ARTERY;  Surgeon: Conrad Helena, MD;  Location: [facility];  Service: Vascular;  Laterality: Left;  . APPLICATION OF WOUND VAC Left 03/03/2018   Procedure: APPLICATION OF WOUND VAC;  Surgeon:  Angelia Mould, MD;  Location: [facility];  Service: Vascular;  Laterality: Left;  . CYSTOSCOPY W/ URETERAL STENT PLACEMENT Right 06/27/2018   Procedure: CYSTOSCOPY WITH RETROGRADE PYELOGRAM/URETERAL DOUBLE J STENT PLACEMENT;  Surgeon: Ceasar Mons, MD;  Location: [facility];  Service: Urology;  Laterality: Right;  . CYSTOSCOPY WITH RETROGRADE PYELOGRAM, URETEROSCOPY AND STENT PLACEMENT Right 04/03/2020   Procedure: CYSTOSCOPY WITH RIGHT  RETROGRADE PYELOGRAM, URETEROSCOPY WITH HOLMIUM LASER AND STENT EXCHANGE PLACEMENT;  Surgeon: Ardis Hughs, MD;  [facility];  Service: Urology;  Laterality: Right;  . CYSTOSCOPY WITH STENT PLACEMENT N/A 02/23/2019   Procedure: CYSTOSCOPY WITH RIGHT URETERAL STENT EXCHANGE/ LEFT URETERAL STENT PLACEMENT;  Surgeon: Ceasar Mons, MD;  [facility];  Service: Urology;  Laterality: N/A;  . CYSTOSCOPY WITH STENT PLACEMENT Right 06/11/2019   Procedure: CYSTOSCOPY,RIGHT RETROGRADE WITH  STENT PLACEMENT;  Surgeon: Irine Seal, MD;  [facility];  Service: Urology;  Laterality: Right;  . CYSTOSCOPY WITH STENT PLACEMENT Right 03/03/2020   Procedure: CYSTOSCOPY WITH STENT PLACEMENT retroagrade pylerogram;  Surgeon: Ardis Hughs, MD;  [facility];  Service: Urology;  Laterality: Right;  . CYSTOSCOPY/RETROGRADE/URETEROSCOPY/STONE EXTRACTION WITH BASKET Bilateral 05/01/2019   Procedure: CYSTOSCOPY/URETEROSCOPY/STONE EXTRACTION / LASER LITHOTRIPSY, BILATERAL URETEROSCOPY WITH URETERAL STONE EXTRACTION AND STENT EXCHANGE;  Surgeon: Ceasar Mons, MD;  Location[facility];  Service: Urology;  Laterality: Bilateral;  . CYSTOSCOPY/URETEROSCOPY/HOLMIUM LASER/STENT PLACEMENT Right 06/27/2019   Procedure: CYSTOSCOPY/URETEROSCOPY/HOLMIUM LASER/STENT EXCHANGE;  Surgeon: Ceasar Mons, MD;  [facility];  Service: Urology;  Laterality: Right;  . EMBOLECTOMY Left 02/24/2017   Procedure: Left Lower  Extremity Embolectomy and Angiogram.;  Surgeon: Waynetta Sandy, MD;  Location: [facility];  Service: Vascular;  Laterality: Left;  . INSERTION OF ILIAC STENT  02/24/2017   Procedure: INSERTION OF Common ILIAC STENT;  Surgeon: Waynetta Sandy, MD;  Location: [facility];  Service: Vascular;;  . INTRAOPERATIVE ARTERIOGRAM Left 02/25/2018   Procedure: INTRA OPERATIVE ARTERIOGRAM WITH LEFT LEG RUNOFF;  Surgeon: Conrad Amity Gardens, MD;  Location: [facility];  Service: Vascular;  Laterality: Left;  . LOWER EXTREMITY ANGIOGRAM Left 02/24/2017   Procedure: Aortagram, Left lower extremity Run-off;  Surgeon: Waynetta Sandy, MD;  Location: [facility];  Service: Vascular;  Laterality: Left;  . PATCH ANGIOPLASTY Left 02/25/2018   Procedure: PATCH ANGIOPLASTY LEFT SUPERFICIAL FEMORAL ARTERY WITH BOVINE PATCH;  Surgeon: Conrad Poth, MD;  Location: [facility];  Service: Vascular;  Laterality: Left;  . PERCUTANEOUS VENOUS THROMBECTOMY,LYSIS WITH INTRAVASCULAR ULTRASOUND (IVUS) Left 05/12/2018   Procedure: MECHANICAL THROMBECTOMY LEFT LEG, BALLOON ANGIOPLASTY LEFT ANTERIOR TIBIAL ARTERY, AORTOGRAM WITH LEFT LEG RUNOFF;  Surgeon: Serafina Mitchell, MD;  Location: [facility];  Service: Vascular;  Laterality: Left;  . 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 THROMBECTOMY FEMORAL ARTERY Left 02/25/2018   Procedure: LEFT ILIAC AND POPLITEAL ARTERY THROMBECTOMY;  Surgeon: Conrad Keota, MD;  Location: [facility];  Service: Vascular;  Laterality: Left;  . TUBAL LIGATION    . WOUND DEBRIDEMENT Left 03/03/2018   Procedure: DEBRIDEMENT WOUND;  Surgeon: Angelia Mould, MD;  Location: [facility];  Service: Vascular;  Laterality: Left;  . WOUND EXPLORATION Left 03/03/2018   Procedure: WOUND EXPLORATION;  Surgeon: Angelia Mould, MD;  Location: [facility];  Service: Vascular;  Laterality: Left;    Family Psychiatric History: Mother-depression, committed suicide 8 years ago  Family History:  Family History  Problem Relation Age of  Onset  . Depression Mother   . Diabetes Father   . Heart failure Father     Social History:   Social History   Socioeconomic History  . Marital status: Single    Spouse name: Not on file  . Number of children: Not on file  . Years of education: Not on file  . Highest education level: Not on file  Occupational History  . Not on file  Tobacco Use  . Smoking status: Current Some Day Smoker    Packs/day: 0.50    Years: 15.00    Pack years: 7.50    Types: Cigarettes  . Smokeless tobacco: Never Used  Vaping Use  . Vaping Use: Never used  Substance and Sexual Activity  . Alcohol use: Yes    Comment: occassionally  . Drug use: Yes    Types: Marijuana, Cocaine    Comment:  once daily- last time used was night of 02/28/2020  . Sexual activity: Yes    Partners: Male    Birth control/protection: Other-see comments    Comment: BTL  Other Topics Concern  . Not on file  Social History Narrative  . Not on file   Social Determinants of Health   Financial Resource Strain:   . Difficulty of Paying Living Expenses: Not on file  Food Insecurity:   . Worried About Charity fundraiser in the Last Year: Not on file  . Ran Out of Food in the Last Year: Not on file  Transportation Needs:   . Lack of Transportation (Medical): Not on file  . Lack of Transportation (Non-Medical): Not on file  Physical Activity:   . Days of Exercise per Week: Not on file  . Minutes of Exercise per Session: Not on file  Stress:   . Feeling of Stress : Not on file  Social Connections:   . Frequency of Communication with Friends and Family: Not on file  . Frequency of Social Gatherings with Friends and Family: Not on file  . Attends Religious Services: Not on file  . Active Member of Clubs or Organizations: Not on file  . Attends Archivist Meetings: Not on file  . Marital Status: Not on file    Additional Social History: Lives alone, has 2 children who are very close to her.  Has a good  relationship with her children and grandchildren  Allergies:   Allergies  Allergen Reactions  . Ciprofloxacin Hcl Hives  . Dilaudid [Hydromorphone Hcl] Shortness Of Breath and Other (See Comments)    "Asystole," per pt report  . Macrobid [Nitrofurantoin Macrocrystal] Hives, Shortness Of Breath and Rash  . Other Hives, Shortness Of Breath and Rash    NO "-CILLINS"!!!  . Penicillins Shortness Of Breath    Had had cephalosporins without incident Has patient had a PCN reaction causing immediate rash, facial/tongue/throat swelling, SOB or lightheadedness with hypotension: Yes Has patient had a PCN reaction causing severe rash involving mucus membranes or skin necrosis: Unk Has patient had a PCN reaction that required hospitalization: Unk Has patient had a PCN reaction occurring within the last 10 years: No If all of the above answers are "NO", then may proceed with Cephalosporin use.   . Sulfa Antibiotics Hives, Shortness Of Breath and Rash  . Lexapro [Escitalopram Oxalate] Other (See Comments)    "I just did not like it."    Metabolic Disorder Labs: Lab Results  Component Value Date   HGBA1C 8.9 (H) 04/01/2020   MPG 209 04/01/2020   MPG 200.12 09/23/2019   No results found for: PROLACTIN Lab Results  Component Value Date   CHOL 164 01/21/2020   TRIG 235 (H) 01/21/2020   HDL 37 (L) 01/21/2020   CHOLHDL 4.4 01/21/2020   VLDL 47 (H) 06/27/2018   LDLCALC 87 01/21/2020   LDLCALC 24 06/27/2018   Lab Results  Component Value Date   TSH 0.983 07/30/2019    Therapeutic Level Labs: No results found for: LITHIUM No results found for: CBMZ No results found for: VALPROATE  Current Medications: Current Outpatient Medications  Medication Sig Dispense Refill  . acetaminophen (TYLENOL) 500 MG tablet Take 2,000 mg by mouth daily as needed (for headaches).     Marland Kitchen albuterol (PROAIR HFA) 108 (90 Base) MCG/ACT inhaler Inhale 2 puffs into the lungs every 6 (six) hours as needed for  wheezing or shortness of breath. 18 g 2  .  amLODipine (NORVASC) 5 MG tablet Take 1 tablet (5 mg total) by mouth daily. 30 tablet 11  . atorvastatin (LIPITOR) 10 MG tablet Take 1 tablet (10 mg total) by mouth daily. 30 tablet 3  . buPROPion (WELLBUTRIN SR) 150 MG 12 hr tablet Take 1 tablet (150 mg total) by mouth 2 (two) times daily. 60 tablet 1  . cefdinir (OMNICEF) 300 MG capsule Take 1 capsule (300 mg total) by mouth daily. 12 capsule 0  . ferrous sulfate 325 (65 FE) MG tablet Take 1 tablet (325 mg total) by mouth daily with breakfast. 30 tablet 3  . folic acid (FOLVITE) 1 MG tablet Take 1 tablet (1 mg total) by mouth daily. 30 tablet 3  . gabapentin (NEURONTIN) 400 MG capsule Take 1 capsule (400 mg total) by mouth 3 (three) times daily. 90 capsule 3  . glucose blood (ACCU-CHEK GUIDE) test strip Use as instructed to check blood sugar TID. 100 each 2  . hydrOXYzine (ATARAX/VISTARIL) 50 MG tablet Take 1 tablet (50 mg total) by mouth 3 (three) times daily as needed for itching or anxiety. 90 tablet 1  . insulin aspart (NOVOLOG) 100 UNIT/ML injection Inject 5-15 Units into the skin 3 (three) times daily before meals. 10 mL 6  . insulin glargine (LANTUS) 100 UNIT/ML injection Inject 0.65 mLs (65 Units total) into the skin at bedtime. 10 mL 6  . Insulin Pen Needle 31G X 5 MM MISC Use with Lantus and Novolog injections. 100 each 11  . Insulin Syringes, Disposable, U-100 1 ML MISC Use as directed 100 each 0  . loratadine (CLARITIN) 10 MG tablet Take 1 tablet (10 mg total) by mouth daily as needed for allergies. 30 tablet 2  . megestrol (MEGACE) 40 MG tablet Take 1 tablet (40 mg total) by mouth 2 (two) times daily. Can increase to two tablets twice a day in the event of heavy bleeding 60 tablet 5  . methocarbamol (ROBAXIN) 500 MG tablet Take 1 tablet (500 mg total) by mouth 2 (two) times daily. 20 tablet 0  . nicotine (NICODERM CQ - DOSED IN MG/24 HOURS) 14 mg/24hr patch Place 1 patch (14 mg total) onto  the skin daily. 28 patch 2  . nitroGLYCERIN (NITROSTAT) 0.4 MG SL tablet Place 1 tablet (0.4 mg total) under the tongue every 5 (five) minutes as needed for chest pain. 25 tablet 2  . sucralfate (CARAFATE) 1 GM/10ML suspension Take 10 mLs (1 g total) by mouth 2 (two) times daily with breakfast and lunch. 420 mL 0  . TRUEPLUS INSULIN SYRINGE 31G X 5/16" 1 ML MISC USE AS DIRECTED 100 each 3  . TRUEplus Lancets 28G MISC Use as directed 100 each 4  . vitamin B-12 (CYANOCOBALAMIN) 1000 MCG tablet Take 2 tablets (2,000 mcg total) by mouth daily. 30 tablet 3  . warfarin (COUMADIN) 5 MG tablet Use as directed by the Coumadin Clinic. 60 tablet 2  . carvedilol (COREG) 3.125 MG tablet Take 1 tablet (3.125 mg total) by mouth 2 (two) times daily. 60 tablet 6  . clonazePAM (KLONOPIN) 0.5 MG tablet Take 1 tablet (0.5 mg total) by mouth 2 (two) times daily. 60 tablet 1  . sertraline (ZOLOFT) 100 MG tablet Take 1 tablet (100 mg total) by mouth daily. 30 tablet 1   No current facility-administered medications for this visit.    Musculoskeletal: Strength & Muscle Tone: within normal limits Gait & Station: normal Patient leans: N/A  Psychiatric Specialty Exam: Review of Systems  Blood pressure  138/72, pulse 86, height 5\' 7"  (1.702 m), weight 270 lb (122.5 kg), SpO2 100 %.Body mass index is 42.29 kg/m.  General Appearance: Fairly Groomed  Eye Contact:  Good  Speech:  Clear and Coherent and Normal Rate  Volume:  Normal  Mood:  Anxious and Depressed  Affect:  Congruent and Tearful  Thought Process:  Goal Directed and Descriptions of Associations: Intact  Orientation:  Full (Time, Place, and Person)  Thought Content:  Logical  Suicidal Thoughts:  No  Homicidal Thoughts:  No  Memory:  Immediate;   Good Recent;   Good  Judgement:  Fair  Insight:  Fair  Psychomotor Activity:  Normal  Concentration:  Concentration: Good and Attention Span: Good  Recall:  Good  Fund of Knowledge:Good  Language: Good   Akathisia:  Negative  Handed:  Right  AIMS (if indicated):  0  Assets:  Communication Skills Desire for Improvement Financial Resources/Insurance Housing  ADL's:  Intact  Cognition: WNL  Sleep:  Fair   Screenings: GAD-7     Video Visit from 05/29/2020 in [place] for [employer] at [facility] Office Visit from 03/24/2020 in [place] Office Visit from 07/30/2019 in [place] for [facility] Office Visit from 06/20/2018 in [place] Visit from 06/15/2018 in [place] for [facility]  Total GAD-7 Score 16 21 17 16 14     PHQ2-9     Video Visit from 05/29/2020 in [place] for [place] at [facility] Office Visit from 03/24/2020 in [place] Office Visit from 07/30/2019 in [place] for [facility] Office Visit from 06/20/2018 in [place] [facility] Office Visit from 06/15/2018 in [place] for [facility]  PHQ-2 Total Score 2 5 2 3 1   PHQ-9 Total Score 14 26 17 12 12       Assessment and Plan: 44 year old female with long history of MDD and anxiety now seen for evaluation.  She also has history of cocaine use disorder and cannabis use disorder.  Patient is stating that she is going to discontinue her cocaine and cannabis use if she has an optimal regimen to control her anxiety better.  Although the writer does not want to prescribe benzodiazepines given her history of addiction, the fact the patient is using cocaine mainly to curb her anxiety and given the fact that cocaine is very detrimental to her health given her cardiac conditions it will be advisable that we switch her to benzodiazepine for optimal control of anxiety.  1. Major depressive disorder, recurrent episode with anxious distress (HCC)  -Increase sertraline (ZOLOFT) 100 MG tablet; Take 1 tablet (100 mg  total) by mouth daily.  Dispense: 30 tablet; Refill: 1 - buPROPion (WELLBUTRIN SR) 150 MG 12 hr tablet; Take 1 tablet (150 mg total) by mouth 2 (two) times daily.  Dispense: 60 tablet; Refill: 1  2. Anxiety state  - hydrOXYzine (ATARAX/VISTARIL) 50 MG tablet; Take 1 tablet (50 mg total) by mouth 3 (three) times daily as needed for itching or anxiety.  Dispense: 90 tablet; Refill: 1 - sertraline (ZOLOFT) 100 MG tablet; Take 1 tablet (100 mg total) by mouth daily.  Dispense: 30 tablet; Refill: 1 -Start clonazePAM (KLONOPIN) 0.5 MG tablet; Take 1 tablet (0.5 mg total) by mouth 2 (two) times daily.  Dispense: 60 tablet; Refill: 1  3. Cocaine use disorder, moderate, dependence ([place])   4. Cannabis use  disorder, moderate, dependence ([place])    Patient reassured that she will not misuse the prescribed Klonopin and that she is confident that once her anxiety is well controlled she will not be abusing cocaine anymore.  F/up in 6 weeks.  Nevada Crane, MD 10/13/202111:24 AM

## 2020-07-16 NOTE — Patient Instructions (Signed)
Description   Continue 2 tablets on Monday, Wednesday, Friday with 1 tablet all other days.

## 2020-07-18 ENCOUNTER — Ambulatory Visit: Payer: Medicaid Other | Admitting: Clinical

## 2020-07-18 MED FILL — WARFARIN SODIUM 5 MG TABLET: 5 | 30 days supply | Qty: 60 | Fill #2

## 2020-07-18 MED FILL — AMLODIPINE BESYLATE 5 MG TA: 5 | 30 days supply | Qty: 30 | Fill #5

## 2020-07-18 MED FILL — GABAPENTIN 400 MG CAPSULE: 400 | 30 days supply | Qty: 90 | Fill #3

## 2020-07-18 MED FILL — TRUEPLUS SYR 1ML 31GX5/16: 31G X 5/16" | 25 days supply | Qty: 100 | Fill #1

## 2020-07-18 MED FILL — CARVEDILOL 3.125 MG TABLET: 3.125 | 30 days supply | Qty: 60 | Fill #3

## 2020-07-18 MED FILL — ATORVASTATIN 10 MG TABLET: 10 | 30 days supply | Qty: 30 | Fill #3

## 2020-07-21 NOTE — BH Specialist Note (Signed)
Pt did not arrive to video visit and did not answer the phone ; Pt's voicemail not set up so unable to leave voice message; Pt does not have MyChart set up so unable to leave MyChart message.

## 2020-07-25 ENCOUNTER — Other Ambulatory Visit: Payer: Self-pay

## 2020-07-25 ENCOUNTER — Ambulatory Visit: Payer: Medicaid Other | Admitting: Clinical

## 2020-07-25 DIAGNOSIS — Z91199 Patient's noncompliance with other medical treatment and regimen due to unspecified reason: Secondary | ICD-10-CM

## 2020-07-25 DIAGNOSIS — Z5329 Procedure and treatment not carried out because of patient's decision for other reasons: Secondary | ICD-10-CM

## 2020-07-29 ENCOUNTER — Other Ambulatory Visit: Payer: Self-pay

## 2020-07-29 ENCOUNTER — Encounter: Payer: Self-pay | Admitting: Internal Medicine

## 2020-07-29 ENCOUNTER — Ambulatory Visit: Payer: Medicaid Other | Attending: Internal Medicine | Admitting: Internal Medicine

## 2020-07-29 VITALS — BP 124/82 | HR 107 | Resp 16 | Wt 266.2 lb

## 2020-07-29 DIAGNOSIS — Z794 Long term (current) use of insulin: Secondary | ICD-10-CM | POA: Diagnosis not present

## 2020-07-29 DIAGNOSIS — Z86718 Personal history of other venous thrombosis and embolism: Secondary | ICD-10-CM

## 2020-07-29 DIAGNOSIS — E1129 Type 2 diabetes mellitus with other diabetic kidney complication: Secondary | ICD-10-CM | POA: Diagnosis not present

## 2020-07-29 DIAGNOSIS — I5189 Other ill-defined heart diseases: Secondary | ICD-10-CM | POA: Diagnosis not present

## 2020-07-29 DIAGNOSIS — R809 Proteinuria, unspecified: Secondary | ICD-10-CM | POA: Diagnosis not present

## 2020-07-29 DIAGNOSIS — I10 Essential (primary) hypertension: Secondary | ICD-10-CM | POA: Diagnosis not present

## 2020-07-29 DIAGNOSIS — E1165 Type 2 diabetes mellitus with hyperglycemia: Secondary | ICD-10-CM | POA: Diagnosis not present

## 2020-07-29 DIAGNOSIS — F17200 Nicotine dependence, unspecified, uncomplicated: Secondary | ICD-10-CM

## 2020-07-29 DIAGNOSIS — Z01818 Encounter for other preprocedural examination: Secondary | ICD-10-CM | POA: Diagnosis not present

## 2020-07-29 DIAGNOSIS — Z2821 Immunization not carried out because of patient refusal: Secondary | ICD-10-CM

## 2020-07-29 DIAGNOSIS — F1910 Other psychoactive substance abuse, uncomplicated: Secondary | ICD-10-CM

## 2020-07-29 LAB — POCT INR: INR: 1.9 — AB (ref 2.0–3.0)

## 2020-07-29 LAB — POCT GLYCOSYLATED HEMOGLOBIN (HGB A1C): HbA1c, POC (controlled diabetic range): 11.6 % — AB (ref 0.0–7.0)

## 2020-07-29 LAB — GLUCOSE, POCT (MANUAL RESULT ENTRY): POC Glucose: 342 mg/dl — AB (ref 70–99)

## 2020-07-29 MED FILL — NICOTINE 14 MG/24HR PATCH: 14 | 28 days supply | Qty: 28 | Fill #0

## 2020-07-29 MED FILL — WARFARIN SODIUM 5 MG TABLET: 5 | 30 days supply | Qty: 60 | Fill #2

## 2020-07-29 MED FILL — NovoLOG 100 UNIT/ML SOLN: 100 | 22 days supply | Qty: 10 | Fill #3

## 2020-07-29 MED FILL — SERTRALINE HCL 100 MG TAB: 100 | 30 days supply | Qty: 30 | Fill #0

## 2020-07-29 MED FILL — AMLODIPINE BESYLATE 5 MG TA: 5 | 30 days supply | Qty: 30 | Fill #5

## 2020-07-29 NOTE — Progress Notes (Signed)
Patient ID: Terri Sparks, female    DOB: 07/03/1976  MRN: [id number]  CC: Pre-op Exam   Subjective: Terri Sparks is a 44 y.o. female who presents for pre-op eval. Her concerns today include:  HxofDM type2 with macroalbumin, CKD 3,HTN,systolic dysf without heart failure, tob dep,PVDwith hx LLEacute limb ischemia requiring stent to the iliac artery and embolectomy followed by Dr. Zenia Resides vascular surgery, hyperparathyroidism s/p resection of parathryroid adenoma 2010, GERD,IDA,B12 def/folate defanxiety/dep, DUB, subst abuse (cocaine/marijuana).  Presents today for preoperative evaluation for hysterectomy that scheduled for the end of next month.  I had seen her for the same this summer.  She was referred to cardiology because of chest pain and echo showing EF of 40 to 45%.  She did stress test but did not return to complete the resting part of the stress test.  Cardiologist report that the stress images showed no defect suggesting there is no infarct or ischemia.  She deemed the patient to be at intermediate risk for anesthesia however given her comorbidities.  Today patient denies any shortness of breath.  Gets chest pains only when she gets emotionally upset with her children.  DM:   Results for orders placed or performed in visit on 07/29/20  POCT glucose (manual entry)  Result Value Ref Range   POC Glucose 342 (A) 70 - 99 mg/dl  POCT glycosylated hemoglobin (Hb A1C)  Result Value Ref Range   Hemoglobin A1C     HbA1c POC (<> result, manual entry)     HbA1c, POC (prediabetic range)     HbA1c, POC (controlled diabetic range) 11.6 (A) 0.0 - 7.0 %  INR  Result Value Ref Range   INR 1.9 (A) 2.0 - 3.0   AIC up 3 points from last visit.   Use to use Splendid but having to use sugar in her beverages due to limited finances.  Drinks Sprite or Coke "if my stomach is messed up."   Checking BS TID before meals.  Highest in the 330.  Does not have log with her.  Using SSI NovoLog  5-10 units with meals and Lantus 72 (she thinks, she is not sure).  She is supposed to be on Lantus 65 units at bedtime.  Did not take Lantus last night because she fell asleep before remembering to take it.  She reports that she takes her Lantus most nights.  HTN:  Did not take meds as yet.  Usually take meds around 4 p.m No SOB rest/exertion.  She is on carvedilol and amlodipine.   Tob dep:  Still smokes some but uses patches most of the time.  Wellbutrin helps.  1 pk lasting 1 wk or more.  Trying to quit.  Substance Abuse: saw Dr. Buddy Duty last wk.  Started on Clonazepam for anxiety.  She has not used cocaine since being on clonazepam.  Trying to wean off marijuana.    Patient Active Problem List   Diagnosis Date Noted  . Cocaine use disorder, moderate, dependence ([place]) 07/16/2020  . Cannabis use disorder, moderate, dependence ([place]) 07/16/2020  . Homeless 11/13/2019  . Chest pain at rest 09/24/2019  . Sinus tachycardia 09/24/2019  . Polysubstance abuse ([place]) 09/23/2019  . Atypical chest pain   . Stage 3b chronic kidney disease ([place]) 07/30/2019  . Iron deficiency anemia due to chronic blood loss 06/12/2019  . Vitamin B12 deficiency 06/12/2019  . Folate deficiency 06/12/2019  . Constipation 06/12/2019  . Prolonged QT interval   . Ureteral stone with  hydronephrosis 06/11/2019  . Acute renal failure superimposed on stage 3 chronic kidney disease ([place]) 06/26/2018  . Right ureteral stone 06/26/2018  . Essential hypertension 06/26/2018  . Subtherapeutic international normalized ratio (INR) 06/26/2018  . Adrenal nodule ([place]) 06/21/2018  . Lung nodules 06/21/2018  . Abnormal uterine bleeding (AUB) 05/14/2018  . Ischemia of extremity 05/12/2018  . Former smoker 03/14/2018  . Thrombosis of left iliac artery ([place]) 02/25/2018  . Thromboembolism ([place]) 02/25/2018  . Microalbuminuria 09/29/2017  . Insomnia 09/29/2017  . Immunization due 08/01/2017  . Anxiety and depression 04/28/2017  .  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 Iliac artery occlusion, left ([place]) 02/25/2017  . Tobacco abuse 02/25/2017  . Insulin-requiring or dependent type II diabetes mellitus ([place]) 02/25/2017  . Peripheral vascular disease of lower extremity ([place]) 02/24/2017  . GERD (gastroesophageal reflux disease)   . Hyperparathyroidism   . Anxiety state      Current Outpatient Medications on File Prior to Visit  Medication Sig Dispense Refill  . acetaminophen (TYLENOL) 500 MG tablet Take 2,000 mg by mouth daily as needed (for headaches).     Marland Kitchen albuterol (PROAIR HFA) 108 (90 Base) MCG/ACT inhaler Inhale 2 puffs into the lungs every 6 (six) hours as needed for wheezing or shortness of breath. 18 g 2  . amLODipine (NORVASC) 5 MG tablet Take 1 tablet (5 mg total) by mouth daily. 30 tablet 11  . atorvastatin (LIPITOR) 10 MG tablet Take 1 tablet (10 mg total) by mouth daily. 30 tablet 3  . buPROPion (WELLBUTRIN SR) 150 MG 12 hr tablet Take 1 tablet (150 mg total) by mouth 2 (two) times daily. 60 tablet 1  . carvedilol (COREG) 3.125 MG tablet Take 1 tablet (3.125 mg total) by mouth 2 (two) times daily. 60 tablet 6  . cefdinir (OMNICEF) 300 MG capsule Take 1 capsule (300 mg total) by mouth daily. 12 capsule 0  . clonazePAM (KLONOPIN) 0.5 MG tablet Take 1 tablet (0.5 mg total) by mouth 2 (two) times daily. 60 tablet 1  . ferrous sulfate 325 (65 FE) MG tablet Take 1 tablet (325 mg total) by mouth daily with breakfast. 30 tablet 3  . folic acid (FOLVITE) 1 MG tablet Take 1 tablet (1 mg total) by mouth daily. 30 tablet 3  . gabapentin (NEURONTIN) 400 MG capsule Take 1 capsule (400 mg total) by mouth 3 (three) times daily. 90 capsule 3  . glucose blood (ACCU-CHEK GUIDE) test strip Use as instructed to check blood sugar TID. 100 each 2  . hydrOXYzine (ATARAX/VISTARIL) 50 MG tablet Take 1 tablet (50 mg total) by mouth 3 (three) times daily as needed for itching or anxiety. 90 tablet 1  . insulin aspart  (NOVOLOG) 100 UNIT/ML injection Inject 5-15 Units into the skin 3 (three) times daily before meals. 10 mL 6  . insulin glargine (LANTUS) 100 UNIT/ML injection Inject 0.65 mLs (65 Units total) into the skin at bedtime. 10 mL 6  . Insulin Pen Needle 31G X 5 MM MISC Use with Lantus and Novolog injections. 100 each 11  . Insulin Syringes, Disposable, U-100 1 ML MISC Use as directed 100 each 0  . loratadine (CLARITIN) 10 MG tablet Take 1 tablet (10 mg total) by mouth daily as needed for allergies. 30 tablet 2  . megestrol (MEGACE) 40 MG tablet Take 1 tablet (40 mg total) by mouth 2 (two) times daily. Can increase to two tablets twice a day in the event of heavy bleeding 60 tablet  5  . methocarbamol (ROBAXIN) 500 MG tablet Take 1 tablet (500 mg total) by mouth 2 (two) times daily. 20 tablet 0  . nicotine (NICODERM CQ - DOSED IN MG/24 HOURS) 14 mg/24hr patch Place 1 patch (14 mg total) onto the skin daily. 28 patch 2  . nitroGLYCERIN (NITROSTAT) 0.4 MG SL tablet Place 1 tablet (0.4 mg total) under the tongue every 5 (five) minutes as needed for chest pain. 25 tablet 2  . sertraline (ZOLOFT) 100 MG tablet Take 1 tablet (100 mg total) by mouth daily. 30 tablet 1  . sucralfate (CARAFATE) 1 GM/10ML suspension Take 10 mLs (1 g total) by mouth 2 (two) times daily with breakfast and lunch. 420 mL 0  . TRUEPLUS INSULIN SYRINGE 31G X 5/16" 1 ML MISC USE AS DIRECTED 100 each 3  . TRUEplus Lancets 28G MISC Use as directed 100 each 4  . vitamin B-12 (CYANOCOBALAMIN) 1000 MCG tablet Take 2 tablets (2,000 mcg total) by mouth daily. 30 tablet 3  . warfarin (COUMADIN) 5 MG tablet Use as directed by the [facility]. 60 tablet 2   No current facility-administered medications on file prior to visit.    Allergies  Allergen Reactions  . Ciprofloxacin Hcl Hives  . Dilaudid [Hydromorphone Hcl] Shortness Of Breath and Other (See Comments)    "Asystole," per pt report  . Macrobid [Nitrofurantoin Macrocrystal] Hives,  Shortness Of Breath and Rash  . Other Hives, Shortness Of Breath and Rash    NO "-CILLINS"!!!  . Penicillins Shortness Of Breath    Had had cephalosporins without incident Has patient had a PCN reaction causing immediate rash, facial/tongue/throat swelling, SOB or lightheadedness with hypotension: Yes Has patient had a PCN reaction causing severe rash involving mucus membranes or skin necrosis: Unk Has patient had a PCN reaction that required hospitalization: Unk Has patient had a PCN reaction occurring within the last 10 years: No If all of the above answers are "NO", then may proceed with Cephalosporin use.   . Sulfa Antibiotics Hives, Shortness Of Breath and Rash  . Lexapro [Escitalopram Oxalate] Other (See Comments)    "I just did not like it."    Social History   Socioeconomic History  . Marital status: Single    Spouse name: Not on file  . Number of children: Not on file  . Years of education: Not on file  . Highest education level: Not on file  Occupational History  . Not on file  Tobacco Use  . Smoking status: Current Some Day Smoker    Packs/day: 0.50    Years: 15.00    Pack years: 7.50    Types: Cigarettes  . Smokeless tobacco: Never Used  Vaping Use  . Vaping Use: Never used  Substance and Sexual Activity  . Alcohol use: Yes    Comment: occassionally  . Drug use: Yes    Types: Marijuana, Cocaine    Comment: once daily- last time used was night of 02/28/2020  . Sexual activity: Yes    Partners: Male    Birth control/protection: Other-see comments    Comment: BTL  Other Topics Concern  . Not on file  Social History Narrative  . Not on file   Social Determinants of Health   Financial Resource Strain:   . Difficulty of Paying Living Expenses: Not on file  Food Insecurity:   . Worried About Charity fundraiser in the Last Year: Not on file  . Ran Out of Food in the Last Year:  Not on file  Transportation Needs:   . Lack of Transportation (Medical): Not  on file  . Lack of Transportation (Non-Medical): Not on file  Physical Activity:   . Days of Exercise per Week: Not on file  . Minutes of Exercise per Session: Not on file  Stress:   . Feeling of Stress : Not on file  Social Connections:   . Frequency of Communication with Friends and Family: Not on file  . Frequency of Social Gatherings with Friends and Family: Not on file  . Attends Religious Services: Not on file  . Active Member of Clubs or Organizations: Not on file  . Attends Archivist Meetings: Not on file  . Marital Status: Not on file  Intimate Partner Violence:   . Fear of Current or Ex-Partner: Not on file  . Emotionally Abused: Not on file  . Physically Abused: Not on file  . Sexually Abused: Not on file    Family History  Problem Relation Age of Onset  . Depression Mother   . Diabetes Father   . Heart failure Father     Past Surgical History:  Procedure Laterality Date  . ABDOMINAL AORTOGRAM W/LOWER EXTREMITY N/A 11/14/2017   Procedure: ABDOMINAL AORTOGRAM W/LOWER EXTREMITY;  Surgeon: Waynetta Sandy, MD;  Location: [facility];  Service: Cardiovascular;  Laterality: N/A;  . ANGIOPLASTY ILLIAC ARTERY Left 02/25/2018   Procedure: BALLOON ANGIOPLASTY LEFT ILIAC ARTERY;  Surgeon: Conrad Somerset, MD;  Location: [place];  Service: Vascular;  Laterality: Left;  . APPLICATION OF WOUND VAC Left 03/03/2018   Procedure: APPLICATION OF WOUND VAC;  Surgeon: Angelia Mould, MD;  Location: [place];  Service: Vascular;  Laterality: Left;  . CYSTOSCOPY W/ URETERAL STENT PLACEMENT Right 06/27/2018   Procedure: CYSTOSCOPY WITH RETROGRADE PYELOGRAM/URETERAL DOUBLE J STENT PLACEMENT;  Surgeon: Ceasar Mons, MD;  Location: [facility];  Service: Urology;  Laterality: Right;  . CYSTOSCOPY WITH RETROGRADE PYELOGRAM, URETEROSCOPY AND STENT PLACEMENT Right 04/03/2020   Procedure: CYSTOSCOPY WITH RIGHT  RETROGRADE PYELOGRAM, URETEROSCOPY WITH HOLMIUM LASER  AND STENT EXCHANGE PLACEMENT;  Surgeon: Ardis Hughs, MD;  [facility];  Service: Urology;  Laterality: Right;  . CYSTOSCOPY WITH STENT PLACEMENT N/A 02/23/2019   Procedure: CYSTOSCOPY WITH RIGHT URETERAL STENT EXCHANGE/ LEFT URETERAL STENT PLACEMENT;  Surgeon: Ceasar Mons, MD;  [facility];  Service: Urology;  Laterality: N/A;  . CYSTOSCOPY WITH STENT PLACEMENT Right 06/11/2019   Procedure: CYSTOSCOPY,RIGHT RETROGRADE WITH STENT PLACEMENT;  Surgeon: Irine Seal, MD;  [facility];  Service: Urology;  Laterality: Right;  . CYSTOSCOPY WITH STENT PLACEMENT Right 03/03/2020   Procedure: CYSTOSCOPY WITH STENT PLACEMENT retroagrade pylerogram;  Surgeon: Ardis Hughs, MD;  [facility];  Service: Urology;  Laterality: Right;  . CYSTOSCOPY/RETROGRADE/URETEROSCOPY/STONE EXTRACTION WITH BASKET Bilateral 05/01/2019   Procedure: CYSTOSCOPY/URETEROSCOPY/STONE EXTRACTION / LASER LITHOTRIPSY, BILATERAL URETEROSCOPY WITH URETERAL STONE EXTRACTION AND STENT EXCHANGE;  Surgeon: Ceasar Mons, MD;  Location: [facility];  Service: Urology;  Laterality: Bilateral;  . CYSTOSCOPY/URETEROSCOPY/HOLMIUM LASER/STENT PLACEMENT Right 06/27/2019   Procedure: CYSTOSCOPY/URETEROSCOPY/HOLMIUM LASER/STENT EXCHANGE;  Surgeon: Ceasar Mons, MD;  [facility];  Service: Urology;  Laterality: Right;  . EMBOLECTOMY Left 02/24/2017   Procedure: Left Lower Extremity Embolectomy and Angiogram.;  Surgeon: Waynetta Sandy, MD;  Location: [facility];  Service: Vascular;  Laterality: Left;  . INSERTION OF ILIAC STENT  02/24/2017   Procedure: INSERTION OF Common ILIAC STENT;  Surgeon: Servando Snare  Harrell Gave, MD;  Location: [facility];  Service: Vascular;;  . INTRAOPERATIVE ARTERIOGRAM Left 02/25/2018   Procedure: INTRA OPERATIVE ARTERIOGRAM WITH LEFT LEG RUNOFF;  Surgeon: Conrad Loudon, MD;  Location: [facility];  Service: Vascular;  Laterality: Left;  . LOWER  EXTREMITY ANGIOGRAM Left 02/24/2017   Procedure: Aortagram, Left lower extremity Run-off;  Surgeon: Waynetta Sandy, MD;  Location: [facility];  Service: Vascular;  Laterality: Left;  . PATCH ANGIOPLASTY Left 02/25/2018   Procedure: PATCH ANGIOPLASTY LEFT SUPERFICIAL FEMORAL ARTERY WITH BOVINE PATCH;  Surgeon: Conrad Con[place], MD;  Location: [facility] [place];  Service: Vascular;  Laterality: Left;  . PERCUTANEOUS VENOUS THROMBECTOMY,LYSIS WITH INTRAVASCULAR ULTRASOUND (IVUS) Left 05/12/2018   Procedure: MECHANICAL THROMBECTOMY LEFT LEG, BALLOON ANGIOPLASTY LEFT ANTERIOR TIBIAL ARTERY, AORTOGRAM WITH LEFT LEG RUNOFF;  Surgeon: Serafina Mitchell, MD;  Location: [facility];  Service: Vascular;  Laterality: Left;  . 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 THROMBECTOMY FEMORAL ARTERY Left 02/25/2018   Procedure: LEFT ILIAC AND POPLITEAL ARTERY THROMBECTOMY;  Surgeon: Conrad Congers, MD;  Location: [facility];  Service: Vascular;  Laterality: Left;  . TUBAL LIGATION    . WOUND DEBRIDEMENT Left 03/03/2018   Procedure: DEBRIDEMENT WOUND;  Surgeon: Angelia Mould, MD;  Location: [facility];  Service: Vascular;  Laterality: Left;  . WOUND EXPLORATION Left 03/03/2018   Procedure: WOUND EXPLORATION;  Surgeon: Angelia Mould, MD;  [facility];  Service: Vascular;  Laterality: Left;    ROS: Review of Systems Negative except as stated above  PHYSICAL EXAM: BP 124/82   Pulse (!) 107   Resp 16   Wt 266 lb 3.2 oz (120.7 kg)   SpO2 97%   BMI 41.69 kg/m   Physical Exam  General appearance - alert, well appearing, and in no distress Mental status - normal mood, behavior, speech, dress, motor activity, and thought processes Mouth - mucous membranes moist, pharynx normal without lesions Neck - supple, no significant adenopathy Chest - clear to auscultation, no wheezes, rales or rhonchi, symmetric air entry Heart -mild tachycardia but regular. Extremities - peripheral pulses normal, no pedal edema, no clubbing or  cyanosis   CMP Latest Ref Rng & Units 04/01/2020 03/10/2020 03/04/2020  Glucose 70 - 99 mg/dL 181(H) 230(H) -  BUN 6 - 20 mg/dL 12 22(H) -  Creatinine 0.44 - 1.00 mg/dL 1.50(H) 2.14(H) -  Sodium 135 - 145 mmol/L 138 133(L) -  Potassium 3.5 - 5.1 mmol/L 4.6 4.8 4.3  Chloride 98 - 111 mmol/L 105 102 -  CO2 22 - 32 mmol/L 26 25 -  Calcium 8.9 - 10.3 mg/dL 9.7 9.5 -  Total Protein 6.5 - 8.1 g/dL - - -  Total Bilirubin 0.3 - 1.2 mg/dL - - -  Alkaline Phos 38 - 126 U/L - - -  AST 15 - 41 U/L - - -  ALT 0 - 44 U/L - - -   Lipid Panel     Component Value Date/Time   CHOL 164 01/21/2020 1603   TRIG 235 (H) 01/21/2020 1603   HDL 37 (L) 01/21/2020 1603   CHOLHDL 4.4 01/21/2020 1603   CHOLHDL 6.5 06/27/2018 0126   VLDL 47 (H) 06/27/2018 0126   LDLCALC 87 01/21/2020 1603    CBC    Component Value Date/Time   WBC 11.5 (H) 04/01/2020 1332   RBC 4.73 04/01/2020 1332   HGB 14.3 04/01/2020 1332   HGB 14.6 01/21/2020 1603   HCT 44.3 04/01/2020 1332  HCT 44.1 01/21/2020 1603   PLT 347 04/01/2020 1332   PLT 490 (H) 01/27/2018 1157   MCV 93.7 04/01/2020 1332   MCV 89 01/21/2020 1603   MCH 30.2 04/01/2020 1332   MCHC 32.3 04/01/2020 1332   RDW 14.6 04/01/2020 1332   RDW 13.5 01/21/2020 1603   LYMPHSABS 1.5 03/03/2020 0356   LYMPHSABS 3.1 01/21/2020 1603   MONOABS 1.3 (H) 03/03/2020 0356   EOSABS 0.0 03/03/2020 0356   EOSABS 0.4 01/21/2020 1603   BASOSABS 0.1 03/03/2020 0356   BASOSABS 0.1 01/21/2020 1603    ASSESSMENT AND PLAN:  1. Preoperative evaluation to rule out surgical contraindication Patient at intermediate risk for surgery given her comorbidities. Before clearing her for surgery, I told patient that we need to work on getting her blood sugars and A1c lower.  I will send a message to her gynecologist.  2. Type 2 diabetes mellitus with hyperglycemia, with long-term current use of insulin ([place]) I suspect noncompliance.  Encouraged her to take the Lantus every day as  prescribed and the NovoLog with meals.  Minimum amount of NovoLog should be 8 units with meals.  Dietary counseling given.  Advised her to eliminate the sugary drinks. -Advised to check blood sugars at least twice a day before meals for the next 2 weeks and bring in her readings to see the clinical pharmacist at that time.  She will follow-up with me in 4 weeks.  Hopefully at that time her blood sugars and A1c are improved enough for [name] to move forward with approving her for surgery. - POCT glucose (manual entry) - POCT glycosylated hemoglobin (Hb A1C) - Comprehensive metabolic panel - Lipid panel  3. Essential hypertension Close to goal.  Continue current medications  4. Systolic dysfunction without heart failure No signs of decompensated heart failure at this time.  5. Tobacco dependence Commended her on cutting back and trying to quit.  Encouraged her to quit.  She will continue to work on cutting down.  Encouraged her to set a quit date.  6. Substance abuse ([place]) Commended her on Quitting cocaine.  Encouraged her to remain clean.  Remind her that if urine drug screen is positive on the day of her surgery the surgery will be canceled.  7. History of arterial thrombosis She is on Coumadin - INR  8. Influenza vaccination declined This was offered.  Patient declined.    Patient was given the opportunity to ask questions.  Patient verbalized understanding of the plan and was able to repeat key elements of the plan.   Orders Placed This Encounter  Procedures  . Comprehensive metabolic panel  . Lipid panel  . POCT glucose (manual entry)  . POCT glycosylated hemoglobin (Hb A1C)  . INR     Requested Prescriptions    No prescriptions requested or ordered in this encounter    Return in about 4 weeks (around 08/26/2020) for Appt with [facility] in 2 wks for recheck blood sugars.  Karle Plumber, MD, FACP

## 2020-07-29 NOTE — Patient Instructions (Signed)
Please take your Lantus insulin 65 units daily. Take NovoLog insulin at least 8 units with meals. Check your blood sugars twice a day and bring in log readings with you on your next visit to see the clinical pharmacist in 2 weeks.

## 2020-07-30 ENCOUNTER — Other Ambulatory Visit: Payer: Self-pay | Admitting: Internal Medicine

## 2020-07-30 DIAGNOSIS — N184 Chronic kidney disease, stage 4 (severe): Secondary | ICD-10-CM

## 2020-07-30 LAB — COMPREHENSIVE METABOLIC PANEL
ALT: 10 IU/L (ref 0–32)
AST: 6 IU/L (ref 0–40)
Albumin/Globulin Ratio: 1.3 (ref 1.2–2.2)
Albumin: 4.2 g/dL (ref 3.8–4.8)
Alkaline Phosphatase: 131 IU/L — ABNORMAL HIGH (ref 44–121)
BUN/Creatinine Ratio: 9 (ref 9–23)
BUN: 19 mg/dL (ref 6–24)
Bilirubin Total: <0.2 mg/dL (ref 0.0–1.2)
CO2: 23 mmol/L (ref 20–29)
Calcium: 9.7 mg/dL (ref 8.7–10.2)
Chloride: 100 mmol/L (ref 96–106)
Creatinine, Ser: 2.23 mg/dL — ABNORMAL HIGH (ref 0.57–1.00)
GFR calc Af Amer: 30 mL/min/{1.73_m2} — ABNORMAL LOW (ref >59)
GFR calc non Af Amer: 26 mL/min/{1.73_m2} — ABNORMAL LOW (ref >59)
Globulin, Total: 3.2 g/dL (ref 1.5–4.5)
Glucose: 323 mg/dL — ABNORMAL HIGH (ref 65–99)
Potassium: 4.6 mmol/L (ref 3.5–5.2)
Sodium: 135 mmol/L (ref 134–144)
Total Protein: 7.4 g/dL (ref 6.0–8.5)

## 2020-07-30 LAB — LIPID PANEL
Chol/HDL Ratio: 5.2 ratio — ABNORMAL HIGH (ref 0.0–4.4)
Cholesterol, Total: 208 mg/dL — ABNORMAL HIGH (ref 100–199)
HDL: 40 mg/dL (ref >39)
LDL Chol Calc (NIH): 103 mg/dL — ABNORMAL HIGH (ref 0–99)
Triglycerides: 381 mg/dL — ABNORMAL HIGH (ref 0–149)
VLDL Cholesterol Cal: 65 mg/dL — ABNORMAL HIGH (ref 5–40)

## 2020-07-30 MED ORDER — ATORVASTATIN CALCIUM 10 MG PO TABS: 10.0000 mg | ORAL_TABLET | Freq: Every day | ORAL | 6 refills | Status: DC

## 2020-07-31 MED FILL — ATORVASTATIN 10 MG TABLET: 10 | 30 days supply | Qty: 30 | Fill #0

## 2020-08-05 ENCOUNTER — Telehealth: Payer: Self-pay

## 2020-08-05 MED FILL — ACCU-CHEK GUIDE TEST STRIP: 33 days supply | Qty: 100 | Fill #1

## 2020-08-05 MED FILL — hydrOXYzine HCL 50 MG TABS: 50 | 30 days supply | Qty: 90 | Fill #0

## 2020-08-05 MED FILL — LANTUS 100 UNITS/ML VIAL: 100 | 30 days supply | Qty: 20 | Fill #1

## 2020-08-05 NOTE — Telephone Encounter (Signed)
-----   Message from Chancy Milroy, MD sent at 08/04/2020 11:46 AM EDT ----- Regarding: RE: Surgery 11/30 I am not sure. I think her PCP has told her. I tried to send the pt a my chart message but she does not use my chart. Can you follow up with pt to be sure she is aware. Thanks Legrand Como  ----- Message ----- From: Francia Greaves Sent: 08/04/2020  11:22 AM EDT To: Chancy Milroy, MD Subject: RE: Surgery 11/30                              Does the patient know? ----- Message ----- From: Chancy Milroy, MD Sent: 08/04/2020  10:25 AM EDT To: Olegario Shearer Battle Subject: RE: Surgery 11/30                              Please cancel surgery due to uncontrolled DM. Will reschedule when cleared by PCP. Thanks Legrand Como  ----- Message ----- From: Francia Greaves Sent: 07/03/2020   2:48 PM EDT To: Excell Seltzer, RN, # Subject: Surgery 11/30                                  Done Posted for 09/02/2020 @1300  w/ [facility] CPT [phone] ----- Message ----- From: Chancy Milroy, MD Sent: 07/03/2020  10:36 AM EDT To: Olegario Shearer Battle  Please schedule pt for [facility] and possible salpingectomy. This case will need to be done in the main [facility] OR due to pt's medical problems.  Thanks Legrand Como

## 2020-08-05 NOTE — Telephone Encounter (Signed)
Called patient to let her know that her surgery has been cancelled due to uncontrolled diabetes, no answer unable to leave message, mailbox full

## 2020-08-05 NOTE — Telephone Encounter (Signed)
Called and spoke to Terri Sparks, explained to her that we needed to postpone her surgery right now due to her diabetes. Explain to her that we can reschedule once she has better control. We discussed her upcoming appointments with her PCP, advised her to keep [name] updated and ask her PCP to give her a letter for surgery clearance once her sugars are better. Patient expressed understanding.

## 2020-08-11 ENCOUNTER — Other Ambulatory Visit: Payer: Self-pay | Admitting: Internal Medicine

## 2020-08-11 MED FILL — PROAIR HFA 90 MCG INHALER: 108 (90 BAS | 25 days supply | Qty: 9 | Fill #0

## 2020-08-11 NOTE — Telephone Encounter (Signed)
Requested medication (s) are due for refill today: yes  Requested medication (s) are on the active medication list: yes  Last refill:  09/24/19 #90 3 RF  Future visit scheduled: yes  Notes to clinic:  last refill given at hospital discharge   Requested Prescriptions  Pending Prescriptions Disp Refills   gabapentin (NEURONTIN) 400 MG capsule [Pharmacy Med Name: GABAPENTIN 400 MG CAPSULE 400 Capsule] 90 capsule 4    Sig: Take 1 capsule (400 mg total) by mouth 3 (three) times daily.      Neurology: Anticonvulsants - gabapentin Passed - 08/11/2020 12:08 PM      Passed - Valid encounter within last 12 months    Recent Outpatient Visits           1 week ago Preoperative evaluation to rule out surgical contraindication   [place] Ladell Pier, MD   1 month ago Thromboembolism [facility] [place])   [place], Jarome Matin, RPH-CPP   4 months ago Type 2 diabetes mellitus with microalbuminuria, with long-term current use of insulin [facility])   [place], MD   6 months ago Preoperative evaluation to rule out surgical contraindication   [place], MD   9 months ago 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

## 2020-08-11 NOTE — Telephone Encounter (Signed)
Will forward request to PCP. Patient reports that she is supposed to be taking 20 mg of atorvastatin daily.

## 2020-08-11 NOTE — Telephone Encounter (Signed)
Requested medication (s) are due for refill today: atorvastatin: no  albuterol inhaler: yes  Requested medication (s) are on the active medication list: yes  Last refill:  atorvastatin: 07/30/20   Albuterol inhaler: 09/24/19  Future visit scheduled: yes  Notes to clinic:  pt currently taking 20 mg daily- per chart review based on last lipid profile, indicated if pt is taking meds regularly the dose was tom go to 20 mg) pt was not taking regularly never saw the written order for 20 mg. Lease review.   Requested Prescriptions  Pending Prescriptions Disp Refills   atorvastatin (LIPITOR) 10 MG tablet [Pharmacy Med Name: ATORVASTATIN 10 MG TABLET 10 Tablet] 30 tablet 3    Sig: Take 1 tablet (10 mg total) by mouth daily.      Cardiovascular:  Antilipid - Statins Failed - 08/11/2020 12:11 PM      Failed - Total Cholesterol in normal range and within 360 days    Cholesterol, Total  Date Value Ref Range Status  07/29/2020 208 (H) 100 - 199 mg/dL Final          Failed - LDL in normal range and within 360 days    LDL Chol Calc (NIH)  Date Value Ref Range Status  07/29/2020 103 (H) 0 - 99 mg/dL Final          Failed - Triglycerides in normal range and within 360 days    Triglycerides  Date Value Ref Range Status  07/29/2020 381 (H) 0 - 149 mg/dL Final          Passed - HDL in normal range and within 360 days    HDL  Date Value Ref Range Status  07/29/2020 40 >39 mg/dL Final          Passed - Patient is not pregnant      Passed - Valid encounter within last 12 months    Recent Outpatient Visits           1 week ago Preoperative evaluation to rule out surgical contraindication   [place], Deborah B, MD   1 month ago Thromboembolism [facility])   [place], Annie Main L, RPH-CPP   4 months ago Type 2 diabetes mellitus with microalbuminuria, with long-term current use of insulin [facility])   [place] Ladell Pier, MD   6 months ago Preoperative evaluation to rule out surgical contraindication   [place], Deborah B, MD   9 months ago 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      Pulmonology:  Beta Agonists Failed - 08/11/2020 12:11 PM      Failed - One inhaler should last at least one month.  If the patient is requesting refills earlier, contact the patient to check for uncontrolled symptoms.      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)   [place], Deborah B, MD       Future Appointments              In 2 days Daisy Blossom, Jarome Matin, [place]   In 3 weeks Ladell Pier, MD [place]

## 2020-08-12 MED FILL — ATORVASTATIN 10 MG TABLET: 10 | 30 days supply | Qty: 30 | Fill #0

## 2020-08-12 MED FILL — GABAPENTIN 400 MG CAPSULE: 400 | 30 days supply | Qty: 90 | Fill #0

## 2020-08-12 NOTE — Progress Notes (Unsigned)
S:     PCP: Dr. Wynetta Emery PMH: T2DM, PVD, Thromboembolism, HTN, GERD, CKD-3, Tobacco abuse, anxiety, depression  Patient arrives in good spirits. Presents for diabetes evaluation, education, and management.  Patient was referred and last seen by Primary Care Provider on 07/29/20 in which POCT BG was elevated at 342 with A1C of 11.6% which increased from 8.9% in June 2021. PCP suspected noncompliance and encouraged her to take the Lantus every day as prescribed and use a minimum of 8 units of NovoLog with meals. Additionally, hysterectomy scheduled for 09/02/20 was cancelled due to uncontrolled DM.   Today, patient reports ***   Last visit -Using SSI NovoLog 5-10 units with meals and Lantus 72 (she thinks, she is not sure).  She is supposed to be on Lantus 65 units at bedtime.  Did not take Lantus last night because she fell asleep before remembering to take it.  She reports that she takes her Lantus most nights.  victoza use in the past?   Labs 10/26 - Scr 2.23, eGFR 26, LDL 103, TG 381   A1C = Check Clinic BG Review medications and adherence (timing of meds, etc.)  Ate or drank anything prior to visit today? At home BGs?  [place] Kitchen Highs . Lows  Hyperglycemia sx (nocturia, neuropathy, visual changes, foot exams) Hypoglycemia symptoms (dizziness, shaky, sweating, hungry, confusion) Diet Exercise  Patient reports Diabetes was diagnosed in ***.   Family/Social History:  -Fhx; heart failure (father), diabetes (father), depression (mother) -Tobacco: current every day smoker but uses patches most of the time and wellbutrin. 1 pk lasting 1 wk or more.  Trying to quit. -illicit drugs: hx of subst abuse (cocaine/marijuana).  Insurance coverage/medication affordability: [place]  Medication adherence reported *** .   Current diabetes medications include: carvedilol 3.125 mg BID, Novolog 5-15 units TID w/meals, Lantus 65 units daily Current hypertension medications  include: amlodipine 5 mg daily Current hyperlipidemia medications include: atorvastatin 10 mg daily  Patient {Actions; denies-reports:120008} hypoglycemic events.  Patient reported dietary habits: Eats *** meals/day Breakfast:*** Lunch:*** Dinner:*** Snacks:*** Drinks: Use to use Splendid but having to use sugar in her beverages due to limited finances; Drinks Sprite or Coke "if my stomach is messed up."    Patient-reported exercise habits: ***   Patient {Actions; denies-reports:120008} nocturia (nighttime urination).  Patient {Actions; denies-reports:120008} neuropathy (nerve pain). Patient {Actions; denies-reports:120008} visual changes. Patient {Actions; denies-reports:120008} self foot exams.     O:  Physical Exam   ROS   Lab Results  Component Value Date   HGBA1C 11.6 (A) 07/29/2020   There were no vitals filed for this visit.  Lipid Panel     Component Value Date/Time   CHOL 208 (H) 07/29/2020 1528   TRIG 381 (H) 07/29/2020 1528   HDL 40 07/29/2020 1528   CHOLHDL 5.2 (H) 07/29/2020 1528   CHOLHDL 6.5 06/27/2018 0126   VLDL 47 (H) 06/27/2018 0126   LDLCALC 103 (H) 07/29/2020 1528    Home fasting blood sugars: ***  2 hour post-meal/random blood sugars: ***.   Clinical Atherosclerotic Cardiovascular Disease (ASCVD): {YES/NO:21197} The 10-year ASCVD risk score Mikey Bussing DC Jr., et al., 2013) is: 11.5%   Values used to calculate the score:     Age: 44 years     Sex: Female     Is Non-Hispanic African American: No     Diabetic: Yes     Tobacco smoker: Yes     Systolic Blood Pressure: 704 mmHg  Is BP treated: Yes     HDL Cholesterol: 40 mg/dL     Total Cholesterol: 208 mg/dL    A/P: Diabetes longstanding*** currently ***. Patient is *** able to verbalize appropriate hypoglycemia management plan. Medication adherence appears ***. Control is suboptimal due to ***. -{Meds adjust:18428} basal insulin *** (insulin ***). Patient will continue to titrate 1 unit  every *** days if fasting blood sugar > 118m/dl until fasting blood sugars reach goal or next visit.  -{Meds adjust:18428}  rapid insulin *** (insulin ***) to ***.  -{Meds adjust:18428} GLP-1 *** (generic name***) to ***.  -{Meds adjust:18428} SGLT2-I *** (generic name***) to ***. Counseled on sick day rules for ***. -Extensively discussed pathophysiology of diabetes, recommended lifestyle interventions, dietary effects on blood sugar control -Counseled on s/sx of and management of hypoglycemia -Next A1C anticipated ***.   ASCVD risk - primary***secondary prevention in patient with diabetes. Last LDL {Is/is not:9024} controlled. ASCVD risk score {Is/is not:9024} >20%  - {Desc; low/moderate/high:110033} intensity statin indicated. Aspirin {Is/is not:9024} indicated.  -{Meds adjust:18428} aspirin *** mg  -{Meds adjust:18428} ***statin *** mg.   Written patient instructions provided.  Total time in face to face counseling *** minutes.   Follow up Pharmacist/PCP*** Clinic Visit in ***.    ILorel Monaco PharmD PGY2 Ambulatory Care Resident [facility]

## 2020-08-13 ENCOUNTER — Ambulatory Visit: Payer: Medicaid Other | Admitting: Pharmacist

## 2020-08-14 ENCOUNTER — Other Ambulatory Visit (HOSPITAL_COMMUNITY): Payer: Self-pay | Admitting: Psychiatry

## 2020-08-14 DIAGNOSIS — F411 Generalized anxiety disorder: Secondary | ICD-10-CM

## 2020-08-20 MED FILL — HYDROXYZINE PAM 50 MG CAP: 50 | 30 days supply | Qty: 90 | Fill #1

## 2020-08-20 MED FILL — NICOTINE 14 MG/24HR PATCH: 14 | 28 days supply | Qty: 28 | Fill #1

## 2020-08-20 MED FILL — NovoLOG 100 UNIT/ML SOLN: 100 | 22 days supply | Qty: 10 | Fill #0

## 2020-08-25 ENCOUNTER — Ambulatory Visit (HOSPITAL_COMMUNITY): Payer: Medicaid Other | Admitting: Psychiatry

## 2020-09-02 ENCOUNTER — Ambulatory Visit: Admit: 2020-09-02 | Payer: Medicaid Other | Admitting: Obstetrics and Gynecology

## 2020-09-02 SURGERY — HYSTERECTOMY, VAGINAL
Anesthesia: Choice | Laterality: Bilateral

## 2020-09-04 ENCOUNTER — Ambulatory Visit: Payer: Medicaid Other | Admitting: Internal Medicine

## 2020-09-08 MED FILL — NovoLOG 100 UNIT/ML SOLN: 100 | 22 days supply | Qty: 10 | Fill #1

## 2020-09-08 MED FILL — SERTRALINE HCL 100 MG TAB: 100 | 30 days supply | Qty: 30 | Fill #1

## 2020-09-08 MED FILL — LANTUS 100 UNITS/ML VIAL: 100 | 30 days supply | Qty: 20 | Fill #2

## 2020-09-08 MED FILL — ACCU-CHEK GUIDE TEST STRIP: 33 days supply | Qty: 100 | Fill #2

## 2020-09-15 MED FILL — hydrOXYzine HCL 50 MG TABS: 50 | 30 days supply | Qty: 90 | Fill #1

## 2020-09-15 MED FILL — GABAPENTIN 400 MG CAPSULE: 400 | 30 days supply | Qty: 90 | Fill #1

## 2020-09-15 MED FILL — NICOTINE 14 MG/24HR PATCH: 14 | 28 days supply | Qty: 28 | Fill #2

## 2020-09-15 MED FILL — ATORVASTATIN 10 MG TABLET: 10 | 30 days supply | Qty: 30 | Fill #1

## 2020-09-19 ENCOUNTER — Ambulatory Visit: Payer: Medicaid Other | Admitting: Internal Medicine

## 2020-10-03 ENCOUNTER — Other Ambulatory Visit: Payer: Self-pay | Admitting: Internal Medicine

## 2020-10-03 MED FILL — LANTUS 100 UNITS/ML VIAL: 100 | 15 days supply | Qty: 10 | Fill #3

## 2020-10-03 MED FILL — NovoLOG 100 UNIT/ML SOLN: 100 | 22 days supply | Qty: 10 | Fill #2

## 2020-10-05 ENCOUNTER — Other Ambulatory Visit: Payer: Self-pay | Admitting: Internal Medicine

## 2020-10-05 NOTE — Telephone Encounter (Signed)
Requested Prescriptions  Pending Prescriptions Disp Refills  . glucose blood (ACCU-CHEK GUIDE) test strip [Pharmacy Med Name: [place] Strip] 100 strip 2    Sig: USE AS INSTRUCTED TO [address] DAILY     Endocrinology: Diabetes - Testing Supplies Passed - 10/03/2020  9:20 AM      Passed - Valid encounter within last 12 months    Recent Outpatient Visits          2 months ago Preoperative evaluation to rule out surgical contraindication   [place] Ladell Pier, MD   2 months ago Thromboembolism [facility])   [place], Annie Main L, RPH-CPP   6 months ago Type 2 diabetes mellitus with microalbuminuria, with long-term current use of insulin [facility])   [place] Ladell Pier, MD   8 months ago Preoperative evaluation to rule out surgical contraindication   [place] Ladell Pier, MD   10 months ago 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

## 2020-10-13 MED FILL — NovoLOG 100 UNIT/ML SOLN: 100 | 22 days supply | Qty: 10 | Fill #2

## 2020-10-13 MED FILL — GABAPENTIN 400 MG CAPSULE: 400 | 30 days supply | Qty: 90 | Fill #2

## 2020-10-13 MED FILL — hydrOXYzine HCL 50 MG TABS: 50 | 30 days supply | Qty: 90 | Fill #2

## 2020-10-13 MED FILL — LANTUS 100 UNITS/ML VIAL: 100 | 15 days supply | Qty: 10 | Fill #3

## 2020-10-13 MED FILL — ACCU-CHEK GUIDE TEST STRIP: 33 days supply | Qty: 100 | Fill #0

## 2020-10-13 MED FILL — HYDROXYZINE PAM 50 MG CAP: 50 | 30 days supply | Qty: 90 | Fill #2

## 2020-11-11 ENCOUNTER — Other Ambulatory Visit: Payer: Self-pay | Admitting: Internal Medicine

## 2020-11-11 DIAGNOSIS — E1129 Type 2 diabetes mellitus with other diabetic kidney complication: Secondary | ICD-10-CM

## 2020-11-11 DIAGNOSIS — Z794 Long term (current) use of insulin: Secondary | ICD-10-CM

## 2020-11-11 MED FILL — HYDROXYZINE PAM 50 MG CAP: 50 | 30 days supply | Qty: 90 | Fill #3

## 2020-11-11 MED FILL — ACCU-CHEK GUIDE TEST STRIP: 33 days supply | Qty: 100 | Fill #1

## 2020-11-11 MED FILL — NovoLOG 100 UNIT/ML SOLN: 100 | 22 days supply | Qty: 10 | Fill #3

## 2020-11-11 MED FILL — LANTUS 100 UNITS/ML VIAL: 100 | 15 days supply | Qty: 10 | Fill #0

## 2020-11-13 MED FILL — GABAPENTIN 400 MG CAPSULE: 400 | 30 days supply | Qty: 90 | Fill #3

## 2020-12-08 ENCOUNTER — Other Ambulatory Visit: Payer: Self-pay

## 2020-12-08 ENCOUNTER — Emergency Department (HOSPITAL_COMMUNITY)
Admission: EM | Admit: 2020-12-08 | Discharge: 2020-12-08 | Disposition: A | Discharge status: Home / Self Care | Payer: Medicaid Other | Attending: Emergency Medicine | Admitting: Emergency Medicine

## 2020-12-08 DIAGNOSIS — L02214 Cutaneous abscess of groin: Secondary | ICD-10-CM | POA: Insufficient documentation

## 2020-12-08 DIAGNOSIS — Z5321 Procedure and treatment not carried out due to patient leaving prior to being seen by health care provider: Secondary | ICD-10-CM | POA: Diagnosis not present

## 2020-12-08 NOTE — ED Triage Notes (Signed)
Pt reports recurrent boil on R groin x 3-4 days.

## 2020-12-08 NOTE — ED Notes (Signed)
Patient called for vitals recheck x3 with no response and not visible in lobby

## 2020-12-10 ENCOUNTER — Other Ambulatory Visit: Payer: Self-pay | Admitting: Internal Medicine

## 2020-12-10 DIAGNOSIS — F17200 Nicotine dependence, unspecified, uncomplicated: Secondary | ICD-10-CM

## 2020-12-10 MED FILL — ATORVASTATIN 10 MG TABLET: 10 | 30 days supply | Qty: 30 | Fill #2

## 2020-12-10 MED FILL — SERTRALINE HCL 50 MG TABLET: 50 | 90 days supply | Qty: 90 | Fill #0

## 2020-12-10 MED FILL — BUPROPION SR 150 MG TABLET: 150 | 30 days supply | Qty: 60 | Fill #0

## 2020-12-10 MED FILL — LANTUS 100 UNITS/ML VIAL: 100 | 15 days supply | Qty: 10 | Fill #1

## 2020-12-10 MED FILL — NICOTINE 14 MG/24HR PATCH: 14 | 28 days supply | Qty: 28 | Fill #0

## 2020-12-10 MED FILL — hydrOXYzine HCL 50 MG TABS: 50 | 30 days supply | Qty: 90 | Fill #3

## 2020-12-10 MED FILL — GABAPENTIN 400 MG CAPSULE: 400 | 30 days supply | Qty: 90 | Fill #4

## 2020-12-10 MED FILL — ACCU-CHEK GUIDE TEST STRIP: 33 days supply | Qty: 100 | Fill #2

## 2020-12-10 MED FILL — CARVEDILOL 3.125 MG TABLET: 3.125 | 30 days supply | Qty: 60 | Fill #3

## 2020-12-10 MED FILL — NovoLOG 100 UNIT/ML SOLN: 100 | 22 days supply | Qty: 10 | Fill #4

## 2020-12-10 NOTE — Telephone Encounter (Signed)
Requested medication (s) are due for refill today: no  Requested medication (s) are on the active medication list: yes  Last refill:  07/29/2020  Future visit scheduled: no  Notes to clinic:  this refill cannot be delegated   Requested Prescriptions  Pending Prescriptions Disp Refills   warfarin (COUMADIN) 5 MG tablet [Pharmacy Med Name: WARFARIN SODIUM 5 MG TABLET 5 Tablet] 60 tablet 2    Sig: USE AS DIRECTED BY THE COUMADIN CLINIC.      Hematology:  Anticoagulants - warfarin Failed - 12/10/2020  2:16 PM      Failed - This refill cannot be delegated      Failed - If the patient is managed by Coumadin Clinic - route to their Pool. If not, forward to the provider.      Failed - INR in normal range and within 30 days    INR  Date Value Ref Range Status  07/29/2020 1.9 (A) 2.0 - 3.0 Final  09/24/2019 1.1 0.8 - 1.2 Final    Comment:    (NOTE) INR goal varies based on device and disease states. Performed at [facility], [place] [address], [place], [place] [address]           Failed - Valid encounter within last 3 months    Recent Outpatient Visits           4 months ago Preoperative evaluation to rule out surgical contraindication   [place] Ladell Pier, MD   5 months ago Thromboembolism 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      Psychiatry:  Drug Dependence Therapy Passed - 12/10/2020  2:16 PM      Passed - Valid encounter within last 12 months    Recent Outpatient Visits           4 months ago Preoperative evaluation to rule out surgical contraindication   [place] Ladell Pier, MD   5 months ago Thromboembolism [facility])   [place], Jarome Matin, RPH-CPP   8 months ago Type 2 diabetes mellitus with microalbuminuria, with long-term current use of insulin [facility])   [place] Ladell Pier, MD   10 months ago Preoperative evaluation to rule out surgical contraindication   [place] Ladell Pier, MD   1 year ago 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

## 2020-12-11 ENCOUNTER — Other Ambulatory Visit: Payer: Self-pay | Admitting: Internal Medicine

## 2020-12-11 MED FILL — WARFARIN SODIUM 5 MG TABLET: 5 | 30 days supply | Qty: 30 | Fill #0

## 2020-12-27 ENCOUNTER — Emergency Department (HOSPITAL_COMMUNITY): Payer: Medicaid Other

## 2020-12-27 ENCOUNTER — Encounter (HOSPITAL_COMMUNITY): Payer: Self-pay

## 2020-12-27 ENCOUNTER — Inpatient Hospital Stay (HOSPITAL_COMMUNITY): Payer: Medicaid Other

## 2020-12-27 ENCOUNTER — Inpatient Hospital Stay (HOSPITAL_COMMUNITY)
Admission: EM | Admit: 2020-12-27 | Discharge: 2021-01-06 | DRG: 271 | Disposition: A | Discharge status: Home / Self Care | Payer: Medicaid Other | Attending: Vascular Surgery | Admitting: Vascular Surgery

## 2020-12-27 DIAGNOSIS — I70222 Atherosclerosis of native arteries of extremities with rest pain, left leg: Secondary | ICD-10-CM

## 2020-12-27 DIAGNOSIS — N179 Acute kidney failure, unspecified: Secondary | ICD-10-CM | POA: Diagnosis not present

## 2020-12-27 DIAGNOSIS — Z8249 Family history of ischemic heart disease and other diseases of the circulatory system: Secondary | ICD-10-CM | POA: Diagnosis not present

## 2020-12-27 DIAGNOSIS — F32A Depression, unspecified: Secondary | ICD-10-CM | POA: Diagnosis present

## 2020-12-27 DIAGNOSIS — Z95828 Presence of other vascular implants and grafts: Secondary | ICD-10-CM

## 2020-12-27 DIAGNOSIS — F17210 Nicotine dependence, cigarettes, uncomplicated: Secondary | ICD-10-CM | POA: Diagnosis present

## 2020-12-27 DIAGNOSIS — I998 Other disorder of circulatory system: Secondary | ICD-10-CM

## 2020-12-27 DIAGNOSIS — Z20822 Contact with and (suspected) exposure to covid-19: Secondary | ICD-10-CM | POA: Diagnosis present

## 2020-12-27 DIAGNOSIS — E1151 Type 2 diabetes mellitus with diabetic peripheral angiopathy without gangrene: Secondary | ICD-10-CM | POA: Diagnosis present

## 2020-12-27 DIAGNOSIS — Z87442 Personal history of urinary calculi: Secondary | ICD-10-CM

## 2020-12-27 DIAGNOSIS — E669 Obesity, unspecified: Secondary | ICD-10-CM | POA: Diagnosis present

## 2020-12-27 DIAGNOSIS — D500 Iron deficiency anemia secondary to blood loss (chronic): Secondary | ICD-10-CM | POA: Diagnosis not present

## 2020-12-27 DIAGNOSIS — Z6841 Body Mass Index (BMI) 40.0 and over, adult: Secondary | ICD-10-CM

## 2020-12-27 DIAGNOSIS — N1832 Chronic kidney disease, stage 3b: Secondary | ICD-10-CM | POA: Diagnosis not present

## 2020-12-27 DIAGNOSIS — K219 Gastro-esophageal reflux disease without esophagitis: Secondary | ICD-10-CM | POA: Diagnosis present

## 2020-12-27 DIAGNOSIS — E1122 Type 2 diabetes mellitus with diabetic chronic kidney disease: Secondary | ICD-10-CM | POA: Diagnosis present

## 2020-12-27 DIAGNOSIS — Z452 Encounter for adjustment and management of vascular access device: Secondary | ICD-10-CM | POA: Diagnosis not present

## 2020-12-27 DIAGNOSIS — F419 Anxiety disorder, unspecified: Secondary | ICD-10-CM | POA: Diagnosis present

## 2020-12-27 DIAGNOSIS — M62262 Nontraumatic ischemic infarction of muscle, left lower leg: Secondary | ICD-10-CM | POA: Diagnosis not present

## 2020-12-27 DIAGNOSIS — Z833 Family history of diabetes mellitus: Secondary | ICD-10-CM | POA: Diagnosis not present

## 2020-12-27 DIAGNOSIS — E1165 Type 2 diabetes mellitus with hyperglycemia: Secondary | ICD-10-CM | POA: Diagnosis not present

## 2020-12-27 DIAGNOSIS — Y92009 Unspecified place in unspecified non-institutional (private) residence as the place of occurrence of the external cause: Secondary | ICD-10-CM | POA: Diagnosis not present

## 2020-12-27 DIAGNOSIS — Z9889 Other specified postprocedural states: Secondary | ICD-10-CM | POA: Diagnosis present

## 2020-12-27 DIAGNOSIS — Z86718 Personal history of other venous thrombosis and embolism: Secondary | ICD-10-CM | POA: Diagnosis not present

## 2020-12-27 DIAGNOSIS — Z818 Family history of other mental and behavioral disorders: Secondary | ICD-10-CM | POA: Diagnosis not present

## 2020-12-27 DIAGNOSIS — Z888 Allergy status to other drugs, medicaments and biological substances status: Secondary | ICD-10-CM

## 2020-12-27 DIAGNOSIS — Z7901 Long term (current) use of anticoagulants: Secondary | ICD-10-CM | POA: Diagnosis not present

## 2020-12-27 DIAGNOSIS — F411 Generalized anxiety disorder: Secondary | ICD-10-CM

## 2020-12-27 DIAGNOSIS — Z794 Long term (current) use of insulin: Secondary | ICD-10-CM | POA: Diagnosis not present

## 2020-12-27 DIAGNOSIS — Z882 Allergy status to sulfonamides status: Secondary | ICD-10-CM

## 2020-12-27 DIAGNOSIS — Y831 Surgical operation with implant of artificial internal device as the cause of abnormal reaction of the patient, or of later complication, without mention of misadventure at the time of the procedure: Secondary | ICD-10-CM | POA: Diagnosis present

## 2020-12-27 DIAGNOSIS — T45516A Underdosing of anticoagulants, initial encounter: Secondary | ICD-10-CM | POA: Diagnosis present

## 2020-12-27 DIAGNOSIS — T82868A Thrombosis of vascular prosthetic devices, implants and grafts, initial encounter: Secondary | ICD-10-CM | POA: Diagnosis not present

## 2020-12-27 DIAGNOSIS — I10 Essential (primary) hypertension: Secondary | ICD-10-CM | POA: Diagnosis not present

## 2020-12-27 DIAGNOSIS — Z91128 Patient's intentional underdosing of medication regimen for other reason: Secondary | ICD-10-CM

## 2020-12-27 DIAGNOSIS — Z885 Allergy status to narcotic agent status: Secondary | ICD-10-CM

## 2020-12-27 DIAGNOSIS — F1920 Other psychoactive substance dependence, uncomplicated: Secondary | ICD-10-CM | POA: Diagnosis present

## 2020-12-27 DIAGNOSIS — Z79899 Other long term (current) drug therapy: Secondary | ICD-10-CM

## 2020-12-27 DIAGNOSIS — R609 Edema, unspecified: Secondary | ICD-10-CM | POA: Diagnosis not present

## 2020-12-27 DIAGNOSIS — Z880 Allergy status to penicillin: Secondary | ICD-10-CM | POA: Diagnosis not present

## 2020-12-27 DIAGNOSIS — I129 Hypertensive chronic kidney disease with stage 1 through stage 4 chronic kidney disease, or unspecified chronic kidney disease: Secondary | ICD-10-CM | POA: Diagnosis not present

## 2020-12-27 DIAGNOSIS — R000 Tachycardia, unspecified: Secondary | ICD-10-CM | POA: Diagnosis not present

## 2020-12-27 DIAGNOSIS — Z28311 Partially vaccinated for covid-19: Secondary | ICD-10-CM

## 2020-12-27 DIAGNOSIS — I70229 Atherosclerosis of native arteries of extremities with rest pain, unspecified extremity: Secondary | ICD-10-CM

## 2020-12-27 DIAGNOSIS — I743 Embolism and thrombosis of arteries of the lower extremities: Secondary | ICD-10-CM | POA: Diagnosis present

## 2020-12-27 LAB — CBC WITH DIFFERENTIAL/PLATELET
Abs Immature Granulocytes: 0.05 10*3/uL (ref 0.00–0.07)
Basophils Absolute: 0.1 10*3/uL (ref 0.0–0.1)
Basophils Relative: 1 %
Eosinophils Absolute: 0.4 10*3/uL (ref 0.0–0.5)
Eosinophils Relative: 3 %
HCT: 40.6 % (ref 36.0–46.0)
Hemoglobin: 13.1 g/dL (ref 12.0–15.0)
Immature Granulocytes: 0 %
Lymphocytes Relative: 21 %
Lymphs Abs: 3 10*3/uL (ref 0.7–4.0)
MCH: 26.4 pg (ref 26.0–34.0)
MCHC: 32.3 g/dL (ref 30.0–36.0)
MCV: 81.9 fL (ref 80.0–100.0)
Monocytes Absolute: 0.8 10*3/uL (ref 0.1–1.0)
Monocytes Relative: 6 %
Neutro Abs: 9.9 10*3/uL — ABNORMAL HIGH (ref 1.7–7.7)
Neutrophils Relative %: 69 %
Platelets: 446 10*3/uL — ABNORMAL HIGH (ref 150–400)
RBC: 4.96 MIL/uL (ref 3.87–5.11)
RDW: 16 % — ABNORMAL HIGH (ref 11.5–15.5)
WBC: 14.3 10*3/uL — ABNORMAL HIGH (ref 4.0–10.5)
nRBC: 0 % (ref 0.0–0.2)

## 2020-12-27 LAB — URINALYSIS, ROUTINE W REFLEX MICROSCOPIC
Bilirubin Urine: NEGATIVE
Glucose, UA: >=500 mg/dL — AB
Ketones, ur: NEGATIVE mg/dL
Nitrite: NEGATIVE
Protein, ur: NEGATIVE mg/dL
Specific Gravity, Urine: 1.016 (ref 1.005–1.030)
WBC, UA: >50 WBC/hpf — ABNORMAL HIGH (ref 0–5)
pH: 5 (ref 5.0–8.0)

## 2020-12-27 LAB — COMPREHENSIVE METABOLIC PANEL
ALT: 11 U/L (ref 0–44)
AST: 13 U/L — ABNORMAL LOW (ref 15–41)
Albumin: 3.5 g/dL (ref 3.5–5.0)
Alkaline Phosphatase: 93 U/L (ref 38–126)
Anion gap: 8 (ref 5–15)
BUN: 16 mg/dL (ref 6–20)
CO2: 26 mmol/L (ref 22–32)
Calcium: 9.8 mg/dL (ref 8.9–10.3)
Chloride: 96 mmol/L — ABNORMAL LOW (ref 98–111)
Creatinine, Ser: 1.85 mg/dL — ABNORMAL HIGH (ref 0.44–1.00)
GFR, Estimated: 34 mL/min — ABNORMAL LOW (ref >60)
Glucose, Bld: 438 mg/dL — ABNORMAL HIGH (ref 70–99)
Potassium: 3.7 mmol/L (ref 3.5–5.1)
Sodium: 130 mmol/L — ABNORMAL LOW (ref 135–145)
Total Bilirubin: 0.4 mg/dL (ref 0.3–1.2)
Total Protein: 7 g/dL (ref 6.5–8.1)

## 2020-12-27 LAB — HIV ANTIBODY (ROUTINE TESTING W REFLEX): HIV Screen 4th Generation wRfx: NONREACTIVE

## 2020-12-27 LAB — GLUCOSE, CAPILLARY
Glucose-Capillary: 373 mg/dL — ABNORMAL HIGH (ref 70–99)
Glucose-Capillary: 332 mg/dL — ABNORMAL HIGH (ref 70–99)
Glucose-Capillary: 205 mg/dL — ABNORMAL HIGH (ref 70–99)

## 2020-12-27 LAB — CBG MONITORING, ED: Glucose-Capillary: 428 mg/dL — ABNORMAL HIGH (ref 70–99)

## 2020-12-27 LAB — RESP PANEL BY RT-PCR (FLU A&B, COVID) ARPGX2
Influenza A by PCR: NEGATIVE
Influenza B by PCR: NEGATIVE
SARS Coronavirus 2 by RT PCR: NEGATIVE

## 2020-12-27 LAB — HEPARIN LEVEL (UNFRACTIONATED): Heparin Unfractionated: 0.27 IU/mL — ABNORMAL LOW (ref 0.30–0.70)

## 2020-12-27 LAB — PROTIME-INR
INR: 0.9 (ref 0.8–1.2)
INR: 1 (ref 0.8–1.2)
Prothrombin Time: 11.9 seconds (ref 11.4–15.2)
Prothrombin Time: 12.5 seconds (ref 11.4–15.2)

## 2020-12-27 LAB — HEMOGLOBIN A1C
Hgb A1c MFr Bld: 13.3 % — ABNORMAL HIGH (ref 4.8–5.6)
Hgb A1c MFr Bld: 13.5 % — ABNORMAL HIGH (ref 4.8–5.6)
Mean Plasma Glucose: 335.01 mg/dL
Mean Plasma Glucose: 340.75 mg/dL

## 2020-12-27 MED ORDER — LABETALOL HCL 5 MG/ML IV SOLN
10.0000 mg | INTRAVENOUS | Status: AC | PRN
  Administered 2020-12-27 – 2020-12-28 (×4): 10 mg via INTRAVENOUS
  Filled 2020-12-27 (×3): qty 4

## 2020-12-27 MED ORDER — HEPARIN BOLUS VIA INFUSION
6000.0000 [IU] | Freq: Once | INTRAVENOUS | Status: AC
  Administered 2020-12-27: 6000 [IU] via INTRAVENOUS
  Filled 2020-12-27: qty 6000

## 2020-12-27 MED ORDER — INSULIN ASPART 100 UNIT/ML ~~LOC~~ SOLN
0.0000 [IU] | Freq: Every day | SUBCUTANEOUS | Status: DC
  Administered 2020-12-27 – 2020-12-28 (×2): 2 [IU] via SUBCUTANEOUS
  Administered 2020-12-29: 4 [IU] via SUBCUTANEOUS
  Administered 2020-12-30: 3 [IU] via SUBCUTANEOUS
  Administered 2020-12-31 – 2021-01-02 (×3): 2 [IU] via SUBCUTANEOUS
  Administered 2021-01-05: 5 [IU] via SUBCUTANEOUS

## 2020-12-27 MED ORDER — GUAIFENESIN-DM 100-10 MG/5ML PO SYRP: 15.0000 mL | ORAL_SOLUTION | ORAL | Status: DC | PRN

## 2020-12-27 MED ORDER — INSULIN ASPART 100 UNIT/ML ~~LOC~~ SOLN
0.0000 [IU] | Freq: Three times a day (TID) | SUBCUTANEOUS | Status: DC
  Administered 2020-12-27: 11 [IU] via SUBCUTANEOUS
  Administered 2020-12-27: 15 [IU] via SUBCUTANEOUS
  Administered 2020-12-28 (×3): 8 [IU] via SUBCUTANEOUS
  Administered 2020-12-29: 2 [IU] via SUBCUTANEOUS
  Administered 2020-12-29: 8 [IU] via SUBCUTANEOUS
  Administered 2020-12-30: 11 [IU] via SUBCUTANEOUS
  Administered 2020-12-30 (×3): 3 [IU] via SUBCUTANEOUS
  Administered 2020-12-31: 8 [IU] via SUBCUTANEOUS
  Administered 2020-12-31: 3 [IU] via SUBCUTANEOUS
  Administered 2020-12-31: 8 [IU] via SUBCUTANEOUS
  Administered 2021-01-01: 5 [IU] via SUBCUTANEOUS
  Administered 2021-01-01: 3 [IU] via SUBCUTANEOUS
  Administered 2021-01-01 – 2021-01-02 (×2): 5 [IU] via SUBCUTANEOUS
  Administered 2021-01-02: 8 [IU] via SUBCUTANEOUS
  Administered 2021-01-02: 3 [IU] via SUBCUTANEOUS
  Administered 2021-01-03 (×3): 5 [IU] via SUBCUTANEOUS
  Administered 2021-01-04: 3 [IU] via SUBCUTANEOUS
  Administered 2021-01-04: 2 [IU] via SUBCUTANEOUS
  Administered 2021-01-04 – 2021-01-05 (×3): 5 [IU] via SUBCUTANEOUS
  Administered 2021-01-05: 3 [IU] via SUBCUTANEOUS
  Administered 2021-01-06: 5 [IU] via SUBCUTANEOUS
  Administered 2021-01-06: 8 [IU] via SUBCUTANEOUS

## 2020-12-27 MED ORDER — METOPROLOL TARTRATE 5 MG/5ML IV SOLN: 2.0000 mg | INTRAVENOUS | Status: DC | PRN

## 2020-12-27 MED ORDER — OXYCODONE HCL 5 MG PO TABS
5.0000 mg | ORAL_TABLET | ORAL | Status: DC | PRN
  Administered 2020-12-27 – 2020-12-31 (×17): 10 mg via ORAL
  Administered 2021-01-01: 5 mg via ORAL
  Administered 2021-01-03 – 2021-01-06 (×6): 10 mg via ORAL
  Filled 2020-12-27 (×37): qty 2

## 2020-12-27 MED ORDER — MORPHINE SULFATE (PF) 2 MG/ML IV SOLN
2.0000 mg | Freq: Once | INTRAVENOUS | Status: AC
  Administered 2020-12-27: 2 mg via INTRAVENOUS
  Filled 2020-12-27: qty 1

## 2020-12-27 MED ORDER — HYDRALAZINE HCL 20 MG/ML IJ SOLN
5.0000 mg | INTRAMUSCULAR | Status: DC | PRN
  Administered 2020-12-28: 5 mg via INTRAVENOUS
  Filled 2020-12-27 (×2): qty 1

## 2020-12-27 MED ORDER — ACETAMINOPHEN 325 MG PO TABS
325.0000 mg | ORAL_TABLET | ORAL | Status: DC | PRN
Start: 2020-12-27 — End: 2020-12-27

## 2020-12-27 MED ORDER — POTASSIUM CHLORIDE CRYS ER 20 MEQ PO TBCR
20.0000 meq | EXTENDED_RELEASE_TABLET | Freq: Once | ORAL | Status: AC
  Administered 2020-12-27: 20 meq via ORAL
  Filled 2020-12-27: qty 1

## 2020-12-27 MED ORDER — DIPHENHYDRAMINE HCL 25 MG PO CAPS
25.0000 mg | ORAL_CAPSULE | Freq: Once | ORAL | Status: AC
  Administered 2020-12-27: 25 mg via ORAL
  Filled 2020-12-27: qty 1

## 2020-12-27 MED ORDER — DOCUSATE SODIUM 100 MG PO CAPS
100.0000 mg | ORAL_CAPSULE | Freq: Two times a day (BID) | ORAL | Status: DC
  Administered 2020-12-27 – 2021-01-06 (×20): 100 mg via ORAL
  Filled 2020-12-27 (×20): qty 1

## 2020-12-27 MED ORDER — PANTOPRAZOLE SODIUM 40 MG PO TBEC
40.0000 mg | DELAYED_RELEASE_TABLET | Freq: Every day | ORAL | Status: DC
  Administered 2020-12-27 – 2021-01-06 (×10): 40 mg via ORAL
  Filled 2020-12-27 (×10): qty 1

## 2020-12-27 MED ORDER — HYDROXYZINE HCL 25 MG PO TABS
50.0000 mg | ORAL_TABLET | Freq: Once | ORAL | Status: AC
  Administered 2020-12-27: 50 mg via ORAL
  Filled 2020-12-27: qty 2

## 2020-12-27 MED ORDER — SALINE SPRAY 0.65 % NA SOLN
1.0000 | NASAL | Status: DC | PRN
  Filled 2020-12-27: qty 44

## 2020-12-27 MED ORDER — SODIUM CHLORIDE 0.9 % IV SOLN: INTRAVENOUS | Status: DC

## 2020-12-27 MED ORDER — LACTATED RINGERS IV SOLN: INTRAVENOUS | Status: DC

## 2020-12-27 MED ORDER — LORAZEPAM 0.5 MG PO TABS
0.5000 mg | ORAL_TABLET | Freq: Four times a day (QID) | ORAL | Status: DC | PRN
  Administered 2020-12-27 – 2021-01-06 (×28): 0.5 mg via ORAL
  Filled 2020-12-27 (×28): qty 1

## 2020-12-27 MED ORDER — NICOTINE 21 MG/24HR TD PT24
21.0000 mg | MEDICATED_PATCH | Freq: Every day | TRANSDERMAL | Status: DC
  Administered 2020-12-27 – 2021-01-06 (×10): 21 mg via TRANSDERMAL
  Filled 2020-12-27 (×10): qty 1

## 2020-12-27 MED ORDER — ONDANSETRON HCL 4 MG/2ML IJ SOLN
4.0000 mg | Freq: Four times a day (QID) | INTRAMUSCULAR | Status: DC | PRN
  Administered 2020-12-27 – 2021-01-05 (×16): 4 mg via INTRAVENOUS
  Filled 2020-12-27 (×15): qty 2

## 2020-12-27 MED ORDER — PHENOL 1.4 % MT LIQD
1.0000 | OROMUCOSAL | Status: DC | PRN
  Administered 2020-12-30: 1 via OROMUCOSAL
  Filled 2020-12-27: qty 177

## 2020-12-27 MED ORDER — MORPHINE SULFATE (PF) 4 MG/ML IV SOLN
4.0000 mg | INTRAVENOUS | Status: DC | PRN
  Administered 2020-12-27 – 2020-12-29 (×9): 4 mg via INTRAVENOUS
  Filled 2020-12-27 (×9): qty 1

## 2020-12-27 MED ORDER — SENNOSIDES-DOCUSATE SODIUM 8.6-50 MG PO TABS
1.0000 | ORAL_TABLET | Freq: Every evening | ORAL | Status: DC | PRN
  Administered 2021-01-02 – 2021-01-05 (×2): 1 via ORAL
  Filled 2020-12-27 (×2): qty 1

## 2020-12-27 MED ORDER — ACETAMINOPHEN 325 MG RE SUPP
325.0000 mg | RECTAL | Status: DC | PRN
  Filled 2020-12-27: qty 2

## 2020-12-27 MED ORDER — ACETAMINOPHEN 325 MG PO TABS
650.0000 mg | ORAL_TABLET | Freq: Four times a day (QID) | ORAL | Status: DC
  Administered 2020-12-27 – 2021-01-06 (×34): 650 mg via ORAL
  Filled 2020-12-27 (×37): qty 2

## 2020-12-27 MED ORDER — TRAMADOL HCL 50 MG PO TABS
50.0000 mg | ORAL_TABLET | Freq: Four times a day (QID) | ORAL | Status: DC | PRN
Start: 2020-12-27 — End: 2021-01-06
  Administered 2020-12-27 – 2021-01-05 (×15): 50 mg via ORAL
  Filled 2020-12-27 (×15): qty 1

## 2020-12-27 MED ORDER — ALUM & MAG HYDROXIDE-SIMETH 200-200-20 MG/5ML PO SUSP
15.0000 mL | ORAL | Status: DC | PRN
  Administered 2020-12-27 – 2021-01-02 (×2): 30 mL via ORAL
  Filled 2020-12-27 (×2): qty 30

## 2020-12-27 MED ORDER — HEPARIN (PORCINE) 25000 UT/250ML-% IV SOLN
1850.0000 [IU]/h | INTRAVENOUS | Status: DC
  Administered 2020-12-27: 1450 [IU]/h via INTRAVENOUS
  Administered 2020-12-28: 1500 [IU]/h via INTRAVENOUS
  Filled 2020-12-27 (×3): qty 250

## 2020-12-27 NOTE — H&P (Signed)
[facility] [place]  ASSESSMENT / PLAN: 45 y.o. female with rutherford class 1 acute limb ischemia of the left lower extremity.  Start heparin infusion. Admit to 4E. Check CTA A/P with runoff. Plan aortogram, left lower extremity angiogram, initiation of thrombolysis via left brachial artery approach in OR tomorrow.  I counseled her extensively about her need for lifetime adherence to anticoagulation medicine.  I explained to her that if she does not continue anticoagulation, she will likely lose her leg.  She, of course, must stop smoking and doing drugs.  She likely needs some psychiatric help going forward.  CHIEF COMPLAINT: Left leg pain  HISTORY OF PRESENT ILLNESS: Terri Sparks is a 45 y.o. female very well-known to our service, who presents to [facility], ER with a painful left foot.  The pain is typical of previous episodes of acute limb ischemia she has suffered.  Her vascular history is detailed below.  She can move the foot.  She has sensation in the foot.  She reports not taking Coumadin for several days.  She reports she "forgets" and sometimes "gets depressed and does not care" about her anticoagulation.  She continues to smoke.  She says "this time things are going to be different" when she leaves.  VASCULAR SURGICAL HISTORY:  02/24/2017 - left lower extremity angiogram, stenting left common iliac artery (8 x 59 mm iCast), open thromboembolectomy via femoral, popliteal and posterior tibial exposures by Dr. Donzetta Matters.  11/14/2017 - left lower extremity angiogram via right common femoral access Dr. Donzetta Matters  02/25/2018 - redo open iliofemoral and popliteal/tibial embolectomy via left femoral exposure by Dr. Bridgett Larsson  03/03/2018 - exploration and debridement of infected left groin wound by Dr. Scot Dock  05/12/2018 - mechanical thrombectomy of left common iliac, left femoral, left popliteal, left anterior tibial artery with penumbra device by Drs. Bethena Midget  Past Medical  History:  Diagnosis Date  . Anxiety   . Depression   . Diabetes ([place])    Type 2  . GERD (gastroesophageal reflux disease)   . History of blood clots    ,DVT-left leg, and early 2000, had blood clot behind left breast  . History of kidney stones   . Hypercalcemia   . Hyperparathyroidism   . Hypertension    had been on Lisinopril and PCP took her off it 4 months ago  . Iron deficiency anemia   . Left thyroid nodule   . Mass of right ovary   . Nephrolithiasis   . PAOD (peripheral arterial occlusive disease) ([place])   . Peripheral vascular disease ([place])   . Prolonged Q-T interval on ECG 04/19/2019  . Renal calculi   . Substance abuse ([place])   . Tooth loose    top tooth from the right  is loose     Past Surgical History:  Procedure Laterality Date  . ABDOMINAL AORTOGRAM W/LOWER EXTREMITY N/A 11/14/2017   Procedure: ABDOMINAL AORTOGRAM W/LOWER EXTREMITY;  Surgeon: Waynetta Sandy, MD;  Location: [place] [facility];  Service: Cardiovascular;  Laterality: N/A;  . ANGIOPLASTY ILLIAC ARTERY Left 02/25/2018   Procedure: BALLOON ANGIOPLASTY LEFT ILIAC ARTERY;  Surgeon: Conrad Ghent, MD;  Location: [place];  Service: Vascular;  Laterality: Left;  . APPLICATION OF WOUND VAC Left 03/03/2018   Procedure: APPLICATION OF WOUND VAC;  Surgeon: Angelia Mould, MD;  Location: [place];  Service: Vascular;  Laterality: Left;  . CYSTOSCOPY W/ URETERAL STENT PLACEMENT Right 06/27/2018   Procedure: CYSTOSCOPY WITH  RETROGRADE PYELOGRAM/URETERAL DOUBLE J STENT PLACEMENT;  Surgeon: Ceasar Mons, MD;  Location: [place];  Service: Urology;  Laterality: Right;  . CYSTOSCOPY WITH RETROGRADE PYELOGRAM, URETEROSCOPY AND STENT PLACEMENT Right 04/03/2020   Procedure: CYSTOSCOPY WITH RIGHT  RETROGRADE PYELOGRAM, URETEROSCOPY WITH HOLMIUM LASER AND STENT EXCHANGE PLACEMENT;  Surgeon: Ardis Hughs, MD;  Location: [facility];  Service: Urology;  Laterality: Right;  . CYSTOSCOPY WITH STENT PLACEMENT  N/A 02/23/2019   Procedure: CYSTOSCOPY WITH RIGHT URETERAL STENT EXCHANGE/ LEFT URETERAL STENT PLACEMENT;  Surgeon: Ceasar Mons, MD;  Location: [facility];  Service: Urology;  Laterality: N/A;  . CYSTOSCOPY WITH STENT PLACEMENT Right 06/11/2019   Procedure: CYSTOSCOPY,RIGHT RETROGRADE WITH STENT PLACEMENT;  Surgeon: Irine Seal, MD;  Location: [facility];  Service: Urology;  Laterality: Right;  . CYSTOSCOPY WITH STENT PLACEMENT Right 03/03/2020   Procedure: CYSTOSCOPY WITH STENT PLACEMENT retroagrade pylerogram;  Surgeon: Ardis Hughs, MD;  Location: [facility];  Service: Urology;  Laterality: Right;  . CYSTOSCOPY/RETROGRADE/URETEROSCOPY/STONE EXTRACTION WITH BASKET Bilateral 05/01/2019   Procedure: CYSTOSCOPY/URETEROSCOPY/STONE EXTRACTION / LASER LITHOTRIPSY, BILATERAL URETEROSCOPY WITH URETERAL STONE EXTRACTION AND STENT EXCHANGE;  Surgeon: Ceasar Mons, MD;  Location[facility];  Service: Urology;  Laterality: Bilateral;  . CYSTOSCOPY/URETEROSCOPY/HOLMIUM LASER/STENT PLACEMENT Right 06/27/2019   Procedure: CYSTOSCOPY/URETEROSCOPY/HOLMIUM LASER/STENT EXCHANGE;  Surgeon: Ceasar Mons, MD;  Location: [facility];  Service: Urology;  Laterality: Right;  . EMBOLECTOMY Left 02/24/2017   Procedure: Left Lower Extremity Embolectomy and Angiogram.;  Surgeon: Waynetta Sandy, MD;  Location: [place];  Service: Vascular;  Laterality: Left;  . INSERTION OF ILIAC STENT  02/24/2017   Procedure: INSERTION OF Common ILIAC STENT;  Surgeon: Waynetta Sandy, MD;  Location: [place];  Service: Vascular;;  . INTRAOPERATIVE ARTERIOGRAM Left 02/25/2018   Procedure: INTRA OPERATIVE ARTERIOGRAM WITH LEFT LEG RUNOFF;  Surgeon: Conrad Marion, MD;  Location: [place];  Service: Vascular;  Laterality: Left;  . LOWER EXTREMITY ANGIOGRAM Left 02/24/2017   Procedure: Aortagram, Left lower extremity Run-off;  Surgeon: Waynetta Sandy, MD;  Location: [place];  Service:  Vascular;  Laterality: Left;  . PATCH ANGIOPLASTY Left 02/25/2018   Procedure: PATCH ANGIOPLASTY LEFT SUPERFICIAL FEMORAL ARTERY WITH BOVINE PATCH;  Surgeon: Conrad Glendo, MD;  Location: [place];  Service: Vascular;  Laterality: Left;  . PERCUTANEOUS VENOUS THROMBECTOMY,LYSIS WITH INTRAVASCULAR ULTRASOUND (IVUS) Left 05/12/2018   Procedure: MECHANICAL THROMBECTOMY LEFT LEG, BALLOON ANGIOPLASTY LEFT ANTERIOR TIBIAL ARTERY, AORTOGRAM WITH LEFT LEG RUNOFF;  Surgeon: Serafina Mitchell, MD;  Location: [place];  Service: Vascular;  Laterality: Left;  . 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 THROMBECTOMY FEMORAL ARTERY Left 02/25/2018   Procedure: LEFT ILIAC AND POPLITEAL ARTERY THROMBECTOMY;  Surgeon: Conrad Haworth, MD;  Location: [place];  Service: Vascular;  Laterality: Left;  . TUBAL LIGATION    . WOUND DEBRIDEMENT Left 03/03/2018   Procedure: DEBRIDEMENT WOUND;  Surgeon: Angelia Mould, MD;  Location: [place];  Service: Vascular;  Laterality: Left;  . WOUND EXPLORATION Left 03/03/2018   Procedure: WOUND EXPLORATION;  Surgeon: Angelia Mould, MD;  Location: [facility];  Service: Vascular;  Laterality: Left;    Family History  Problem Relation Age of Onset  . Depression Mother   . Diabetes Father   . Heart failure Father     Social History   Socioeconomic History  . Marital status: Single    Spouse name: Not on file  . Number of children: Not on file  .  Years of education: Not on file  . Highest education level: Not on file  Occupational History  . Not on file  Tobacco Use  . Smoking status: Current Some Day Smoker    Packs/day: 0.50    Years: 15.00    Pack years: 7.50    Types: Cigarettes  . Smokeless tobacco: Never Used  Vaping Use  . Vaping Use: Never used  Substance and Sexual Activity  . Alcohol use: Yes    Comment: occassionally  . Drug use: Yes    Types: Marijuana, Cocaine    Comment: once daily- last time used was night of 02/28/2020  . Sexual activity: Yes     Partners: Male    Birth control/protection: Other-see comments    Comment: BTL  Other Topics Concern  . Not on file  Social History Narrative  . Not on file   Social Determinants of Health   Financial Resource Strain: Not on file  Food Insecurity: Not on file  Transportation Needs: Not on file  Physical Activity: Not on file  Stress: Not on file  Social Connections: Not on file  Intimate Partner Violence: Not on file    Allergies  Allergen Reactions  . Ciprofloxacin Hcl Hives  . Dilaudid [Hydromorphone Hcl] Shortness Of Breath and Other (See Comments)    "Asystole," per pt report  . Macrobid [Nitrofurantoin Macrocrystal] Hives, Shortness Of Breath and Rash  . Other Hives, Shortness Of Breath and Rash    NO "-CILLINS"!!!  . Penicillins Shortness Of Breath    Had had cephalosporins without incident Has patient had a PCN reaction causing immediate rash, facial/tongue/throat swelling, SOB or lightheadedness with hypotension: Yes Has patient had a PCN reaction causing severe rash involving mucus membranes or skin necrosis: Unk Has patient had a PCN reaction that required hospitalization: Unk Has patient had a PCN reaction occurring within the last 10 years: No If all of the above answers are "NO", then may proceed with Cephalosporin use.   . Sulfa Antibiotics Hives, Shortness Of Breath and Rash  . Lexapro [Escitalopram Oxalate] Other (See Comments)    "I just did not like it."    Current Facility-Administered Medications  Medication Dose Route Frequency Provider Last Rate Last Admin  . 0.9 %  sodium chloride infusion   Intravenous Continuous Fredia Sorrow, MD 75 mL/hr at 12/27/20 1026 New Bag at 12/27/20 1026  . heparin ADULT infusion 100 units/mL (25000 units/223m)  1,450 Units/hr Intravenous Continuous EDuanne Limerick RPH      . heparin bolus via infusion 6,000 Units  6,000 Units [facility]venous [facility]      Current Outpatient Medications  Medication Sig  Dispense Refill  . acetaminophen (TYLENOL) 500 MG tablet Take 2,000 mg by mouth daily as needed (for headaches).     .Marland Kitchenalbuterol (VENTOLIN HFA) 108 (90 Base) MCG/ACT inhaler INHALE 2 PUFFS INTO THE LUNGS EVERY 6 (SIX) HOURS AS NEEDED FOR WHEEZING OR SHORTNESS OF BREATH. 18 g 2  . amLODipine (NORVASC) 5 MG tablet Take 1 tablet (5 mg total) by mouth daily. 30 tablet 11  . atorvastatin (LIPITOR) 10 MG tablet TAKE 1 TABLET (10 MG TOTAL) BY MOUTH DAILY. 30 tablet 3  . buPROPion (WELLBUTRIN SR) 150 MG 12 hr tablet Take 1 tablet (150 mg total) by mouth 2 (two) times daily. 60 tablet 1  . carvedilol (COREG) 3.125 MG tablet Take 1 tablet (3.125 mg total) by mouth 2 (two) times daily. 60 tablet 6  . cefdinir (OMNICEF)  300 MG capsule Take 1 capsule (300 mg total) by mouth daily. 12 capsule 0  . clonazePAM (KLONOPIN) 0.5 MG tablet TAKE 1 TABLET (0.5 MG TOTAL) BY MOUTH 2 (TWO) TIMES DAILY. 60 tablet 0  . ferrous sulfate 325 (65 FE) MG tablet Take 1 tablet (325 mg total) by mouth daily with breakfast. 30 tablet 3  . folic acid (FOLVITE) 1 MG tablet Take 1 tablet (1 mg total) by mouth daily. 30 tablet 3  . gabapentin (NEURONTIN) 400 MG capsule TAKE 1 CAPSULE (400 MG TOTAL) BY MOUTH 3 (THREE) TIMES DAILY. 90 capsule 4  . glucose blood (ACCU-CHEK GUIDE) test strip USE AS INSTRUCTED TO CHECK BLOOD SUGAR THREE TIMES DAILY 100 strip 2  . hydrOXYzine (ATARAX/VISTARIL) 50 MG tablet Take 1 tablet (50 mg total) by mouth 3 (three) times daily as needed for itching or anxiety. 90 tablet 1  . insulin aspart (NOVOLOG) 100 UNIT/ML injection Inject 5-15 Units into the skin 3 (three) times daily before meals. 10 mL 6  . Insulin Pen Needle 31G X 5 MM MISC Use with Lantus and Novolog injections. 100 each 11  . Insulin Syringes, Disposable, U-100 1 ML MISC Use as directed 100 each 0  . LANTUS 100 UNIT/ML injection INJECT 0.65 MLS (65 UNITS TOTAL) INTO THE SKIN AT BEDTIME. 10 mL 6  . loratadine (CLARITIN) 10 MG tablet Take 1 tablet  (10 mg total) by mouth daily as needed for allergies. 30 tablet 2  . megestrol (MEGACE) 40 MG tablet Take 1 tablet (40 mg total) by mouth 2 (two) times daily. Can increase to two tablets twice a day in the event of heavy bleeding 60 tablet 5  . methocarbamol (ROBAXIN) 500 MG tablet Take 1 tablet (500 mg total) by mouth 2 (two) times daily. 20 tablet 0  . nicotine (NICODERM CQ - DOSED IN MG/24 HOURS) 14 mg/24hr patch PLACE 1 PATCH (14 MG TOTAL) ONTO THE SKIN DAILY. 28 patch 2  . nitroGLYCERIN (NITROSTAT) 0.4 MG SL tablet Place 1 tablet (0.4 mg total) under the tongue every 5 (five) minutes as needed for chest pain. 25 tablet 2  . sertraline (ZOLOFT) 100 MG tablet Take 1 tablet (100 mg total) by mouth daily. 30 tablet 1  . sucralfate (CARAFATE) 1 GM/10ML suspension Take 10 mLs (1 g total) by mouth 2 (two) times daily with breakfast and lunch. 420 mL 0  . TRUEPLUS INSULIN SYRINGE 31G X 5/16" 1 ML MISC USE AS DIRECTED 100 each 3  . TRUEplus Lancets 28G MISC Use as directed 100 each 4  . vitamin B-12 (CYANOCOBALAMIN) 1000 MCG tablet Take 2 tablets (2,000 mcg total) by mouth daily. 30 tablet 3  . warfarin (COUMADIN) 5 MG tablet USE AS DIRECTED BY THE COUMADIN CLINIC. 30 tablet 0    REVIEW OF SYSTEMS:  '[X]'$  denotes positive finding, '[ ]'$  denotes negative finding Cardiac  Comments:  Chest pain or chest pressure:    Shortness of breath upon exertion:    Short of breath when lying flat:    Irregular heart rhythm:        Vascular    Pain in calf, thigh, or hip brought on by ambulation:    Pain in feet at night that wakes you up from your sleep:     Blood clot in your veins:    Leg swelling:         Pulmonary    Oxygen at home:    Productive cough:     Wheezing:  Neurologic    Sudden weakness in arms or legs:     Sudden numbness in arms or legs:     Sudden onset of difficulty speaking or slurred speech:    Temporary loss of vision in one eye:     Problems with dizziness:          Gastrointestinal    Blood in stool:     Vomited blood:         Genitourinary    Burning when urinating:     Blood in urine:        Psychiatric    Major depression:         Hematologic    Bleeding problems:    Problems with blood clotting too easily:        Skin    Rashes or ulcers:        Constitutional    Fever or chills:      PHYSICAL EXAM  Vitals:   12/27/20 0926  BP: (!) 106/95  Pulse: 95  Resp: (!) 99  Temp: 97.8 F (36.6 C)  TempSrc: Oral  SpO2: 99%  Weight: 109.8 kg  Height: '5\' 7"'$  (1.702 m)    Constitutional: chronically ill, appearing much older than stated age. No distress. Obese.  Neurologic: CN intact. Motor and sensory function intact in the left foot. Psychiatric: Mood and affect symmetric and appropriate. Eyes: No icterus. No conjunctival pallor. Ears, nose, throat: mucous membranes moist. Midline trachea.  Cardiac: regular rate and rhythm.  Respiratory: unlabored. Abdominal: soft, non-tender, non-distended.  Peripheral vascular:  2+ R DP  No pedal pulses or doppler flow in L foot  2+ L radial   2+ L brachial  Difficult to appreciate femoral pulses because of pannus Extremity: No edema. No cyanosis. + pallor LLE below the knee.  Skin: No gangrene. No ulceration.  Lymphatic: No Stemmer's sign. No palpable lymphadenopathy.  PERTINENT LABORATORY AND RADIOLOGIC DATA  Most recent CBC CBC Latest Ref Rng & Units 04/01/2020 03/10/2020 03/04/2020  WBC 4.0 - 10.5 K/uL 11.5(H) 13.1(H) 19.4(H)  Hemoglobin 12.0 - 15.0 g/dL 14.3 13.1 12.5  Hematocrit 36.0 - 46.0 % 44.3 41.1 39.6  Platelets 150 - 400 K/uL 347 398 268     Most recent CMP CMP Latest Ref Rng & Units 07/29/2020 04/01/2020 03/10/2020  Glucose 65 - 99 mg/dL 323(H) 181(H) 230(H)  BUN 6 - 24 mg/dL 19 12 22(H)  Creatinine 0.57 - 1.00 mg/dL 2.23(H) 1.50(H) 2.14(H)  Sodium 134 - 144 mmol/L 135 138 133(L)  Potassium 3.5 - 5.2 mmol/L 4.6 4.6 4.8  Chloride 96 - 106 mmol/L 100 105 102  CO2 20 - 29  mmol/L '23 26 25  '$ Calcium 8.7 - 10.2 mg/dL 9.7 9.7 9.5  Total Protein 6.0 - 8.5 g/dL 7.4 - -  Total Bilirubin 0.0 - 1.2 mg/dL <0.2 - -  Alkaline Phos 44 - 121 IU/L 131(H) - -  AST 0 - 40 IU/L 6 - -  ALT 0 - 32 IU/L 10 - -    Renal function CrCl cannot be calculated (Patient's most recent lab result is older than the maximum 21 days allowed.).  HbA1c, POC (controlled diabetic range) (%)  Date Value  07/29/2020 11.6 (A)    LDL Chol Calc (NIH)  Date Value Ref Range Status  07/29/2020 103 (H) 0 - 99 mg/dL Final    [name]. Stanford Breed, MD [facility] Phone Number: [phone] 12/27/2020 10:34 AM

## 2020-12-27 NOTE — ED Triage Notes (Addendum)
Pt comes from home via EMS. EMS called as pt "thought she might have a clot in left leg", Pt hx of DVT, on warfarin but not compliant, has not taken for 5 days, Pt also hx of DM and not compliant with DM meds. Also hx of htn with noncompliance with meds. Pt reports shooting pain starting just below knee radiating to foot. PMS in tact per EMS but skin is cooler to touch on left. Pt denies sob.  BP 162/94 HR 117 O2 99% RA  BGL 465

## 2020-12-27 NOTE — ED Notes (Signed)
Attempted to call report

## 2020-12-27 NOTE — ED Provider Notes (Signed)
[facility] EMERGENCY DEPARTMENT Provider Note   CSN: [id number] Arrival date & time: 12/27/20  [id number]     History Chief Complaint  Patient presents with  . Cold Extremity    Terri Sparks is a 45 y.o. female.  Patient brought in by EMS.  Patient with acute left foot ankle pain without any injury that occurred about 1 in the morning.  Patient's past medical history is significant for deep vein thrombosis.  Post to be on Coumadin but all for the past 5 days.  Patient also back in 2018 had a left lower extremity acute ischemia had a stent and embolectomy of the iliac artery.  Past medical history significant for diabetes peripheral vascular disease substance abuse and history of deep vein thrombosis.  Patient's only had one of the Covid vaccines.  In addition patient had contacted vascular surgery.  She was last seen by them in November 2020.  Patient contacted on-call vascular surgeon Dr. Stanford Breed.        Past Medical History:  Diagnosis Date  . Anxiety   . Depression   . Diabetes ([place])    Type 2  . GERD (gastroesophageal reflux disease)   . History of blood clots    ,DVT-left leg, and early 2000, had blood clot behind left breast  . History of kidney stones   . Hypercalcemia   . Hyperparathyroidism   . Hypertension    had been on Lisinopril and PCP took her off it 4 months ago  . Iron deficiency anemia   . Left thyroid nodule   . Mass of right ovary   . Nephrolithiasis   . PAOD (peripheral arterial occlusive disease) ([place])   . Peripheral vascular disease ([place])   . Prolonged Q-T interval on ECG 04/19/2019  . Renal calculi   . Substance abuse ([place])   . Tooth loose    top tooth from the right  is loose     Patient Active Problem List   Diagnosis Date Noted  . Cocaine use disorder, moderate, dependence ([place]) 07/16/2020  . Cannabis use disorder, moderate, dependence ([place]) 07/16/2020  . Homeless 11/13/2019  . Chest pain at rest 09/24/2019  . Sinus  tachycardia 09/24/2019  . Polysubstance abuse ([place]) 09/23/2019  . Atypical chest pain   . Stage 3b chronic kidney disease ([place]) 07/30/2019  . Iron deficiency anemia due to chronic blood loss 06/12/2019  . Vitamin B12 deficiency 06/12/2019  . Folate deficiency 06/12/2019  . Constipation 06/12/2019  . Prolonged QT interval   . Ureteral stone with hydronephrosis 06/11/2019  . Acute renal failure superimposed on stage 3 chronic kidney disease ([place]) 06/26/2018  . Right ureteral stone 06/26/2018  . Essential hypertension 06/26/2018  . Subtherapeutic international normalized ratio (INR) 06/26/2018  . Adrenal nodule ([place]) 06/21/2018  . Lung nodules 06/21/2018  . Abnormal uterine bleeding (AUB) 05/14/2018  . Ischemia of extremity 05/12/2018  . Former smoker 03/14/2018  . Thrombosis of left iliac artery ([place]) 02/25/2018  . Thromboembolism ([place]) 02/25/2018  . Microalbuminuria 09/29/2017  . Insomnia 09/29/2017  . Immunization due 08/01/2017  . Anxiety and depression 04/28/2017  . 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 Iliac artery occlusion, left ([place]) 02/25/2017  . Tobacco abuse 02/25/2017  . Insulin-requiring or dependent type II diabetes mellitus ([place]) 02/25/2017  . Peripheral vascular disease of lower extremity ([place]) 02/24/2017  . GERD (gastroesophageal reflux disease)   . Hyperparathyroidism   . Anxiety state     Past Surgical History:  Procedure Laterality Date  . ABDOMINAL AORTOGRAM W/LOWER EXTREMITY N/A 11/14/2017   Procedure: ABDOMINAL AORTOGRAM W/LOWER EXTREMITY;  Surgeon: Waynetta Sandy, MD;  Location: [facility];  Service: Cardiovascular;  Laterality: N/A;  . ANGIOPLASTY ILLIAC ARTERY Left 02/25/2018   Procedure: BALLOON ANGIOPLASTY LEFT ILIAC ARTERY;  Surgeon: Conrad Eden, MD;  Location: [place];  Service: Vascular;  Laterality: Left;  . APPLICATION OF WOUND VAC Left 03/03/2018   Procedure: APPLICATION OF WOUND VAC;  Surgeon: Angelia Mould, MD;  Location: [place];  Service: Vascular;  Laterality: Left;  . CYSTOSCOPY W/ URETERAL STENT PLACEMENT Right 06/27/2018   Procedure: CYSTOSCOPY WITH RETROGRADE PYELOGRAM/URETERAL DOUBLE J STENT PLACEMENT;  Surgeon: Ceasar Mons, MD;  Location: [place];  Service: Urology;  Laterality: Right;  . CYSTOSCOPY WITH RETROGRADE PYELOGRAM, URETEROSCOPY AND STENT PLACEMENT Right 04/03/2020   Procedure: CYSTOSCOPY WITH RIGHT  RETROGRADE PYELOGRAM, URETEROSCOPY WITH HOLMIUM LASER AND STENT EXCHANGE PLACEMENT;  Surgeon: Ardis Hughs, MD;  [facility]: [facility];  Service: Urology;  Laterality: Right;  . CYSTOSCOPY WITH STENT PLACEMENT N/A 02/23/2019   Procedure: CYSTOSCOPY WITH RIGHT URETERAL STENT EXCHANGE/ LEFT URETERAL STENT PLACEMENT;  Surgeon: Ceasar Mons, MD;  [facility]: [facility];  Service: Urology;  Laterality: N/A;  . CYSTOSCOPY WITH STENT PLACEMENT Right 06/11/2019   Procedure: CYSTOSCOPY,RIGHT RETROGRADE WITH STENT PLACEMENT;  Surgeon: Irine Seal, MD;  [facility];  Service: Urology;  Laterality: Right;  . CYSTOSCOPY WITH STENT PLACEMENT Right 03/03/2020   Procedure: CYSTOSCOPY WITH STENT PLACEMENT retroagrade pylerogram;  Surgeon: Ardis Hughs, MD;  [facility];  Service: Urology;  Laterality: Right;  . CYSTOSCOPY/RETROGRADE/URETEROSCOPY/STONE EXTRACTION WITH BASKET Bilateral 05/01/2019   Procedure: CYSTOSCOPY/URETEROSCOPY/STONE EXTRACTION / LASER LITHOTRIPSY, BILATERAL URETEROSCOPY WITH URETERAL STONE EXTRACTION AND STENT EXCHANGE;  Surgeon: Ceasar Mons, MD;  Location: [facility];  Service: Urology;  Laterality: Bilateral;  . CYSTOSCOPY/URETEROSCOPY/HOLMIUM LASER/STENT PLACEMENT Right 06/27/2019   Procedure: CYSTOSCOPY/URETEROSCOPY/HOLMIUM LASER/STENT EXCHANGE;  Surgeon: Ceasar Mons, MD;  [facility];  Service: Urology;  Laterality: Right;  . EMBOLECTOMY Left 02/24/2017   Procedure: Left Lower Extremity Embolectomy and  Angiogram.;  Surgeon: Waynetta Sandy, MD;  Location: [place];  Service: Vascular;  Laterality: Left;  . INSERTION OF ILIAC STENT  02/24/2017   Procedure: INSERTION OF Common ILIAC STENT;  Surgeon: Waynetta Sandy, MD;  Location: [place];  Service: Vascular;;  . INTRAOPERATIVE ARTERIOGRAM Left 02/25/2018   Procedure: INTRA OPERATIVE ARTERIOGRAM WITH LEFT LEG RUNOFF;  Surgeon: Conrad Pulpotio Bareas, MD;  Location: [place];  Service: Vascular;  Laterality: Left;  . LOWER EXTREMITY ANGIOGRAM Left 02/24/2017   Procedure: Aortagram, Left lower extremity Run-off;  Surgeon: Waynetta Sandy, MD;  Location: [place];  Service: Vascular;  Laterality: Left;  . PATCH ANGIOPLASTY Left 02/25/2018   Procedure: PATCH ANGIOPLASTY LEFT SUPERFICIAL FEMORAL ARTERY WITH BOVINE PATCH;  Surgeon: Conrad Sayre, MD;  Location: [facility];  Service: Vascular;  Laterality: Left;  . PERCUTANEOUS VENOUS THROMBECTOMY,LYSIS WITH INTRAVASCULAR ULTRASOUND (IVUS) Left 05/12/2018   Procedure: MECHANICAL THROMBECTOMY LEFT LEG, BALLOON ANGIOPLASTY LEFT ANTERIOR TIBIAL ARTERY, AORTOGRAM WITH LEFT LEG RUNOFF;  Surgeon: Serafina Mitchell, MD;  Location: [facility];  Service: Vascular;  Laterality: Left;  . 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 THROMBECTOMY FEMORAL ARTERY Left 02/25/2018   Procedure: LEFT ILIAC AND POPLITEAL ARTERY THROMBECTOMY;  Surgeon: Conrad , MD;  Location: [place];  Service: Vascular;  Laterality: Left;  . TUBAL LIGATION    .  WOUND DEBRIDEMENT Left 03/03/2018   Procedure: DEBRIDEMENT WOUND;  Surgeon: Angelia Mould, MD;  Location: [facility];  Service: Vascular;  Laterality: Left;  . WOUND EXPLORATION Left 03/03/2018   Procedure: WOUND EXPLORATION;  Surgeon: Angelia Mould, MD;  Location[facility];  Service: Vascular;  Laterality: Left;     OB History    Gravida  5   Para  3   Term  2   Preterm  1   AB  2   Living  3     SAB  2   IAB      Ectopic      Multiple      Live Births               Family History  Problem Relation Age of Onset  . Depression Mother   . Diabetes Father   . Heart failure Father     Social History   Tobacco Use  . Smoking status: Current Some Day Smoker    Packs/day: 0.50    Years: 15.00    Pack years: 7.50    Types: Cigarettes  . Smokeless tobacco: Never Used  Vaping Use  . Vaping Use: Never used  Substance Use Topics  . Alcohol use: Yes    Comment: occassionally  . Drug use: Yes    Types: Marijuana, Cocaine    Comment: once daily- last time used was night of 02/28/2020    Home Medications Prior to Admission medications   Medication Sig Start Date End Date Taking? Authorizing Provider  acetaminophen (TYLENOL) 500 MG tablet Take 2,000 mg by mouth daily as needed (for headaches).     [provider]  albuterol (VENTOLIN HFA) 108 (90 Base) MCG/ACT inhaler INHALE 2 PUFFS INTO THE LUNGS EVERY 6 (SIX) HOURS AS NEEDED FOR WHEEZING OR SHORTNESS OF BREATH. 08/11/20   Ladell Pier, MD  amLODipine (NORVASC) 5 MG tablet Take 1 tablet (5 mg total) by mouth daily. 09/24/19   Darliss Cheney, MD  atorvastatin (LIPITOR) 10 MG tablet TAKE 1 TABLET (10 MG TOTAL) BY MOUTH DAILY. 08/11/20   Ladell Pier, MD  buPROPion (WELLBUTRIN SR) 150 MG 12 hr tablet Take 1 tablet (150 mg total) by mouth 2 (two) times daily. 07/16/20   Nevada Crane, MD  carvedilol (COREG) 3.125 MG tablet Take 1 tablet (3.125 mg total) by mouth 2 (two) times daily. 02/01/20 05/01/20  Elouise Munroe, MD  cefdinir (OMNICEF) 300 MG capsule Take 1 capsule (300 mg total) by mouth daily. 03/05/20   Ladell Pier, MD  clonazePAM (KLONOPIN) 0.5 MG tablet TAKE 1 TABLET (0.5 MG TOTAL) BY MOUTH 2 (TWO) TIMES DAILY. 08/14/20   [place] Crane, MD  ferrous sulfate 325 (65 FE) MG tablet Take 1 tablet (325 mg total) by mouth daily with breakfast. 03/04/20   Nita Sells, MD  folic acid (FOLVITE) 1 MG tablet Take 1 tablet (1 mg total) by mouth daily. 09/24/19   Darliss Cheney, MD  gabapentin (NEURONTIN) 400 MG capsule TAKE 1 CAPSULE (400 MG TOTAL) BY MOUTH 3 (THREE) TIMES DAILY. 08/11/20   Ladell Pier, MD  glucose blood (ACCU-CHEK GUIDE) test strip USE AS INSTRUCTED TO CHECK BLOOD SUGAR THREE TIMES DAILY 10/05/20   Ladell Pier, MD  hydrOXYzine (ATARAX/VISTARIL) 50 MG tablet Take 1 tablet (50 mg total) by mouth 3 (three) times daily as needed for itching or anxiety. 07/16/20   [place] Crane, MD  insulin aspart (NOVOLOG) 100 UNIT/ML injection Inject  5-15 Units into the skin 3 (three) times daily before meals. 04/30/20   Ladell Pier, MD  Insulin Pen Needle 31G X 5 MM MISC Use with Lantus and Novolog injections. 04/19/19   Ladell Pier, MD  Insulin Syringes, Disposable, U-100 1 ML MISC Use as directed 09/24/19   Darliss Cheney, MD  LANTUS 100 UNIT/ML injection INJECT 0.65 MLS (65 UNITS TOTAL) INTO THE SKIN AT BEDTIME. 11/11/20   Ladell Pier, MD  loratadine (CLARITIN) 10 MG tablet Take 1 tablet (10 mg total) by mouth daily as needed for allergies. 09/24/19   Darliss Cheney, MD  megestrol (MEGACE) 40 MG tablet Take 1 tablet (40 mg total) by mouth 2 (two) times daily. Can increase to two tablets twice a day in the event of heavy bleeding 07/03/20   Chancy Milroy, MD  methocarbamol (ROBAXIN) 500 MG tablet Take 1 tablet (500 mg total) by mouth 2 (two) times daily. 03/10/20   Khatri, Hina, PA-C  nicotine (NICODERM CQ - DOSED IN MG/24 HOURS) 14 mg/24hr patch PLACE 1 PATCH (14 MG TOTAL) ONTO THE SKIN DAILY. 12/10/20   Ladell Pier, MD  nitroGLYCERIN (NITROSTAT) 0.4 MG SL tablet Place 1 tablet (0.4 mg total) under the tongue every 5 (five) minutes as needed for chest pain. 05/23/20   Elouise Munroe, MD  sertraline (ZOLOFT) 100 MG tablet Take 1 tablet (100 mg total) by mouth daily. 07/16/20 07/16/21  [place]ev[place] Crane, MD  sucralfate (CARAFATE) 1 GM/10ML suspension Take 10 mLs (1 g total) by mouth 2 (two) times daily with breakfast and lunch. 05/13/20    Ladell Pier, MD  TRUEPLUS INSULIN SYRINGE 31G X 5/16" 1 ML MISC USE AS DIRECTED 05/21/20   Ladell Pier, MD  TRUEplus Lancets 28G MISC Use as directed 11/13/19   Ladell Pier, MD  vitamin B-12 (CYANOCOBALAMIN) 1000 MCG tablet Take 2 tablets (2,000 mcg total) by mouth daily. 09/24/19   Darliss Cheney, MD  warfarin (COUMADIN) 5 MG tablet USE AS DIRECTED BY THE COUMADIN CLINIC. 12/11/20   Ladell Pier, MD    Allergies    Ciprofloxacin hcl, Dilaudid [hydromorphone hcl], Macrobid [nitrofurantoin macrocrystal], Other, Penicillins, Sulfa antibiotics, and Lexapro [escitalopram oxalate]  Review of Systems   Review of Systems  Constitutional: Negative for chills and fever.  HENT: Negative for rhinorrhea and sore throat.   Eyes: Negative for visual disturbance.  Respiratory: Negative for cough and shortness of breath.   Cardiovascular: Negative for chest pain and leg swelling.  Gastrointestinal: Negative for abdominal pain, diarrhea, nausea and vomiting.  Genitourinary: Negative for dysuria.  Musculoskeletal: Negative for back pain and neck pain.  Skin: Positive for pallor. Negative for rash.  Neurological: Negative for dizziness, light-headedness and headaches.  Hematological: Does not bruise/bleed easily.  Psychiatric/Behavioral: Negative for confusion.    Physical Exam Updated Vital Signs BP (!) 106/95 (BP Location: Right Arm)   Pulse 95   Temp 97.8 F (36.6 C) (Oral)   Resp (!) 99   Ht 1.702 m ('5\' 7"'$ )   Wt 109.8 kg   SpO2 99%   BMI 37.90 kg/m   Physical Exam Vitals and nursing note reviewed.  Constitutional:      General: She is in acute distress.     Appearance: She is well-developed.  HENT:     Head: Normocephalic and atraumatic.  Eyes:     Extraocular Movements: Extraocular movements intact.     Conjunctiva/sclera: Conjunctivae normal.     Pupils: Pupils are equal,  round, and reactive to light.  Cardiovascular:     Rate and Rhythm: Normal rate and  regular rhythm.     Heart sounds: No murmur heard.   Pulmonary:     Effort: Pulmonary effort is normal. No respiratory distress.     Breath sounds: Normal breath sounds.  Abdominal:     Palpations: Abdomen is soft.     Tenderness: There is no abdominal tenderness.  Musculoskeletal:        General: No swelling.     Cervical back: Neck supple.     Comments: Patient's left foot and ankle area is pale no cap refill.  Not able to get Doppler dorsalis pedis or posterior tib pulses.  The leg pink stop more approximately.  Patient has movement of the toes.  Sensation intact.  Skin:    General: Skin is warm and dry.  Neurological:     General: No focal deficit present.     Mental Status: She is alert and oriented to person, place, and time.     ED Results / Procedures / Treatments   Labs (all labs ordered are listed, but only abnormal results are displayed) Labs Reviewed  RESP PANEL BY RT-PCR (FLU A&B, COVID) ARPGX2  CBC WITH DIFFERENTIAL/PLATELET  COMPREHENSIVE METABOLIC PANEL  PROTIME-INR  HEPARIN LEVEL (UNFRACTIONATED)  I-STAT BETA HCG BLOOD, ED ([facility], [facility], [facility] ONLY)  I-STAT CHEM 8, ED  CBG MONITORING, ED    EKG EKG Interpretation  Date/Time:  Saturday December 27 2020 09:19:58 EDT Ventricular Rate:  101 PR Interval:    QRS Duration: 83 QT Interval:  368 QTC Calculation: 477 R Axis:   29 Text Interpretation: Sinus tachycardia Confirmed by Fredia Sorrow [phone]) on 12/27/2020 9:47:08 AM   Radiology No results found.  Procedures Procedures   CRITICAL CARE Performed by: Fredia Sorrow Total critical care time: 45 minutes Critical care time was exclusive of separately billable procedures and treating other patients. Critical care was necessary to treat or prevent imminent or life-threatening deterioration. Critical care was time spent personally by me on the following activities: development of treatment plan with patient and/or surrogate as well as nursing, discussions  with consultants, evaluation of patient's response to treatment, examination of patient, obtaining history from patient or surrogate, ordering and performing treatments and interventions, ordering and review of laboratory studies, ordering and review of radiographic studies, pulse oximetry and re-evaluation of patient's condition.   Medications Ordered in ED Medications  0.9 %  sodium chloride infusion ( Intravenous New Bag/Given 12/27/20 1026)  heparin bolus via infusion 6,000 Units (has no administration in time range)  heparin ADULT infusion 100 units/mL (25000 units/265m) (has no administration in time range)  morphine 2 MG/ML injection 2 mg (2 mg Intravenous Given 12/27/20 1009)  hydrOXYzine (ATARAX/VISTARIL) tablet 50 mg (50 mg Oral Given 12/27/20 1029)    ED Course  I have reviewed the triage vital signs and the nursing notes.  Pertinent labs & imaging results that were available during my care of the patient were reviewed by me and considered in my medical decision making (see chart for details).    MDM Rules/Calculators/A&P                          Patient clinically certainly has left lower extremity ischemia.  Probably below the knee.  Contacted Dr. HMilly Jakoband on call for vascular surgery.  He said going to start heparin.  He is due to come in and see  her.  Thinking he probably do CT angio because of her prior interventions.  He did come in and see the patient.  Patient prepped for OR as needed.  Covid testing done.  Made NPO.  Labs ordered.  Labs significant for leukocytosis white count of 14,000 and a glucose of 428.  Patient's GFR is at 34.  Creatinine elevated at 1.85. NR was normal which goes along with the fact that she was not taking her Coumadin.  X-ray without any acute findings.  Covid testing negative.  Vascular surgery will admit/   Final Clinical Impression(s) / ED Diagnoses Final diagnoses:  Lower limb ischemia    Rx / DC Orders ED Discharge Orders    None        Fredia Sorrow, MD 12/27/20 1201

## 2020-12-27 NOTE — ED Notes (Signed)
Pt requested and given 3 warm blankets

## 2020-12-27 NOTE — Progress Notes (Signed)
Lower extremity arterial duplex has been completed.   Preliminary results in CV Proc.   Terri Sparks 12/27/2020 3:06 PM

## 2020-12-27 NOTE — Progress Notes (Signed)
[facility] for Heparin Indication: DVT and inschemic limb  Allergies  Allergen Reactions  . Ciprofloxacin Hcl Hives  . Dilaudid [Hydromorphone Hcl] Shortness Of Breath and Other (See Comments)    "Asystole," per pt report  . Macrobid [Nitrofurantoin Macrocrystal] Hives, Shortness Of Breath and Rash  . Other Hives, Shortness Of Breath and Rash    NO "-CILLINS"!!!  . Penicillins Shortness Of Breath    Had had cephalosporins without incident Has patient had a PCN reaction causing immediate rash, facial/tongue/throat swelling, SOB or lightheadedness with hypotension: Yes Has patient had a PCN reaction causing severe rash involving mucus membranes or skin necrosis: Unk Has patient had a PCN reaction that required hospitalization: Unk Has patient had a PCN reaction occurring within the last 10 years: No If all of the above answers are "NO", then may proceed with Cephalosporin use.   . Sulfa Antibiotics Hives, Shortness Of Breath and Rash  . Lexapro [Escitalopram Oxalate] Other (See Comments)    "I just did not like it."    Patient Measurements: Height: '5\' 7"'$  (170.2 cm) Weight: 109.8 kg (242 lb) IBW/kg (Calculated) : 61.6 Heparin Dosing Weight: 86.8 kg  Vital Signs: Temp: 97.8 F (36.6 C) (03/26 0926) Temp Source: Oral (03/26 0926) BP: 106/95 (03/26 0926) Pulse Rate: 95 (03/26 0926)  Labs: No results for input(s): HGB, HCT, PLT, APTT, LABPROT, INR, HEPARINUNFRC, HEPRLOWMOCWT, CREATININE, CKTOTAL, CKMB, TROPONINIHS in the last 72 hours.  CrCl cannot be calculated (Patient's most recent lab result is older than the maximum 21 days allowed.).   Medical History: Past Medical History:  Diagnosis Date  . Anxiety   . Depression   . Diabetes ([place])    Type 2  . GERD (gastroesophageal reflux disease)   . History of blood clots    ,DVT-left leg, and early 2000, had blood clot behind left breast  . History of kidney stones   . Hypercalcemia    . Hyperparathyroidism   . Hypertension    had been on Lisinopril and PCP took her off it 4 months ago  . Iron deficiency anemia   . Left thyroid nodule   . Mass of right ovary   . Nephrolithiasis   . PAOD (peripheral arterial occlusive disease) ([place])   . Peripheral vascular disease ([place])   . Prolonged Q-T interval on ECG 04/19/2019  . Renal calculi   . Substance abuse ([place])   . Tooth loose    top tooth from the right  is loose     Medications:  Scheduled:    Assessment: Patient is a 27 yof that presents to the ED with complaints of an ischemic limb the patient has a hx of DVTs and is on warfarin PTA however is non compliant and has not taken in ~ 5 days per patient. Pharmacy has been asked to dose heparin at this time for a possible ischemic limb/ subtherapeutic anticoagulation.  Goal of Therapy:  Heparin level 0.3-0.7 units/ml Monitor platelets by anticoagulation protocol: Yes   Plan:  - Heparin bolus 6000 units IV x 1 dose - Heparin drip @ 1450 units/hr - Heparin level in ~ 6 hours  - Monitor patient for s/s of bleeding and CBC while on heparin   Duanne Limerick PharmD. BCPS  12/27/2020,10:12 AM

## 2020-12-27 NOTE — Progress Notes (Signed)
Pt admitted to [facility] from [facility] ED.  Pt is A&OX4 and neuro intact.  Pt placed on telemetry and CCMD notified.  CHG bath completed.  Vitals taken and all within normal range.  Pt is currently having L leg pain.

## 2020-12-27 NOTE — Progress Notes (Signed)
[place] for Heparin Indication: DVT and inschemic limb  Allergies  Allergen Reactions  . Ciprofloxacin Hcl Hives  . Dilaudid [Hydromorphone Hcl] Shortness Of Breath and Other (See Comments)    "Asystole," per pt report  . Macrobid [Nitrofurantoin Macrocrystal] Hives, Shortness Of Breath and Rash  . Other Hives, Shortness Of Breath and Rash    NO "-CILLINS"!!!  . Penicillins Shortness Of Breath    Had had cephalosporins without incident Has patient had a PCN reaction causing immediate rash, facial/tongue/throat swelling, SOB or lightheadedness with hypotension: Yes Has patient had a PCN reaction causing severe rash involving mucus membranes or skin necrosis: Unk Has patient had a PCN reaction that required hospitalization: Unk Has patient had a PCN reaction occurring within the last 10 years: No If all of the above answers are "NO", then may proceed with Cephalosporin use.   . Sulfa Antibiotics Hives, Shortness Of Breath and Rash  . Lexapro [Escitalopram Oxalate] Other (See Comments)    "I just did not like it."    Patient Measurements: Height: '5\' 7"'$  (170.2 cm) Weight: 118.3 kg (260 lb 11.2 oz) IBW/kg (Calculated) : 61.6 Heparin Dosing Weight: 86.8 kg  Vital Signs: Temp: 97.8 F (36.6 C) (03/26 1258) Temp Source: Oral (03/26 1258) BP: 136/67 (03/26 1258) Pulse Rate: 96 (03/26 1258)  Labs: Recent Labs    12/27/20 1006 12/27/20 1113 12/27/20 1243 12/27/20 1453  HGB  --  13.1  --   --   HCT  --  40.6  --   --   PLT  --  446*  --   --   LABPROT 11.9  --  12.5  --   INR 0.9  --  1.0  --   HEPARINUNFRC  --   --   --  0.27*  CREATININE 1.85*  --   --   --     Estimated Creatinine Clearance: 51.1 mL/min (A) (by C-G formula based on SCr of 1.85 mg/dL (H)).   Medical History: Past Medical History:  Diagnosis Date  . Anxiety   . Depression   . Diabetes ([place])    Type 2  . GERD (gastroesophageal reflux disease)   . History of  blood clots    ,DVT-left leg, and early 2000, had blood clot behind left breast  . History of kidney stones   . Hypercalcemia   . Hyperparathyroidism   . Hypertension    had been on Lisinopril and PCP took her off it 4 months ago  . Iron deficiency anemia   . Left thyroid nodule   . Mass of right ovary   . Nephrolithiasis   . 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 Peripheral vascular disease ([place])   . Prolonged Q-T interval on ECG 04/19/2019  . Renal calculi   . Substance abuse ([place])   . Tooth loose    top tooth from the right  is loose     Medications:  Scheduled:  . acetaminophen  650 mg Oral Q6H  . docusate sodium  100 mg Oral BID  . insulin aspart  0-15 Units Subcutaneous TID WC  . insulin aspart  0-5 Units Subcutaneous QHS  . nicotine  21 mg Transdermal Daily  . 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 Pharmacy has been asked to dose heparin at this time for a possible ischemic limb/ subtherapeutic anticoagulation.  -heparin level = 0.27 (heparin bolus around 11am and level resulted ~ 3pm)  Goal of Therapy:  Heparin level 0.3-0.7 units/ml Monitor platelets by anticoagulation protocol: Yes   Plan:  -Increase heparin to 1500 units/hr -Heparin level in 6 hours and daily wth CBC daily  Hildred Laser, PharmD Clinical Pharmacist **Pharmacist phone directory can now be found on [email or web address] ([email or web address]).  Listed under [place].

## 2020-12-28 ENCOUNTER — Inpatient Hospital Stay (HOSPITAL_COMMUNITY): Payer: Medicaid Other | Admitting: Anesthesiology

## 2020-12-28 ENCOUNTER — Inpatient Hospital Stay (HOSPITAL_COMMUNITY): Payer: Medicaid Other

## 2020-12-28 ENCOUNTER — Encounter (HOSPITAL_COMMUNITY)
Admission: EM | Disposition: A | Discharge status: Home / Self Care | Payer: Self-pay | Source: Home / Self Care | Attending: Vascular Surgery

## 2020-12-28 DIAGNOSIS — T82868A Thrombosis of vascular prosthetic devices, implants and grafts, initial encounter: Secondary | ICD-10-CM

## 2020-12-28 HISTORY — PX: LOWER EXTREMITY ANGIOGRAM: SHX5508

## 2020-12-28 LAB — COMPREHENSIVE METABOLIC PANEL
ALT: 10 U/L (ref 0–44)
AST: 15 U/L (ref 15–41)
Albumin: 3.2 g/dL — ABNORMAL LOW (ref 3.5–5.0)
Alkaline Phosphatase: 74 U/L (ref 38–126)
Anion gap: 6 (ref 5–15)
BUN: 18 mg/dL (ref 6–20)
CO2: 28 mmol/L (ref 22–32)
Calcium: 9.3 mg/dL (ref 8.9–10.3)
Chloride: 96 mmol/L — ABNORMAL LOW (ref 98–111)
Creatinine, Ser: 2 mg/dL — ABNORMAL HIGH (ref 0.44–1.00)
GFR, Estimated: 31 mL/min — ABNORMAL LOW (ref >60)
Glucose, Bld: 333 mg/dL — ABNORMAL HIGH (ref 70–99)
Potassium: 4.5 mmol/L (ref 3.5–5.1)
Sodium: 130 mmol/L — ABNORMAL LOW (ref 135–145)
Total Bilirubin: 0.3 mg/dL (ref 0.3–1.2)
Total Protein: 6.3 g/dL — ABNORMAL LOW (ref 6.5–8.1)

## 2020-12-28 LAB — CBC
HCT: 39.4 % (ref 36.0–46.0)
HCT: 39.7 % (ref 36.0–46.0)
HCT: 40.5 % (ref 36.0–46.0)
HCT: 40.9 % (ref 36.0–46.0)
Hemoglobin: 12.4 g/dL (ref 12.0–15.0)
Hemoglobin: 12.6 g/dL (ref 12.0–15.0)
Hemoglobin: 12.6 g/dL (ref 12.0–15.0)
Hemoglobin: 12.6 g/dL (ref 12.0–15.0)
MCH: 26.1 pg (ref 26.0–34.0)
MCH: 26.2 pg (ref 26.0–34.0)
MCH: 26.2 pg (ref 26.0–34.0)
MCH: 26.7 pg (ref 26.0–34.0)
MCHC: 30.8 g/dL (ref 30.0–36.0)
MCHC: 31.1 g/dL (ref 30.0–36.0)
MCHC: 31.5 g/dL (ref 30.0–36.0)
MCHC: 31.7 g/dL (ref 30.0–36.0)
MCV: 82.9 fL (ref 80.0–100.0)
MCV: 84.1 fL (ref 80.0–100.0)
MCV: 84.2 fL (ref 80.0–100.0)
MCV: 85 fL (ref 80.0–100.0)
Platelets: 314 10*3/uL (ref 150–400)
Platelets: 315 10*3/uL (ref 150–400)
Platelets: 376 10*3/uL (ref 150–400)
Platelets: 381 10*3/uL (ref 150–400)
RBC: 4.72 MIL/uL (ref 3.87–5.11)
RBC: 4.75 MIL/uL (ref 3.87–5.11)
RBC: 4.81 MIL/uL (ref 3.87–5.11)
RBC: 4.81 MIL/uL (ref 3.87–5.11)
RDW: 15.7 % — ABNORMAL HIGH (ref 11.5–15.5)
RDW: 15.8 % — ABNORMAL HIGH (ref 11.5–15.5)
RDW: 15.9 % — ABNORMAL HIGH (ref 11.5–15.5)
RDW: 15.9 % — ABNORMAL HIGH (ref 11.5–15.5)
WBC: 13 10*3/uL — ABNORMAL HIGH (ref 4.0–10.5)
WBC: 15.5 10*3/uL — ABNORMAL HIGH (ref 4.0–10.5)
WBC: 17.6 10*3/uL — ABNORMAL HIGH (ref 4.0–10.5)
WBC: 22.9 10*3/uL — ABNORMAL HIGH (ref 4.0–10.5)
nRBC: 0 % (ref 0.0–0.2)
nRBC: 0 % (ref 0.0–0.2)
nRBC: 0 % (ref 0.0–0.2)
nRBC: 0 % (ref 0.0–0.2)

## 2020-12-28 LAB — TYPE AND SCREEN
ABO/RH(D): O NEG
Antibody Screen: NEGATIVE

## 2020-12-28 LAB — GLUCOSE, CAPILLARY
Glucose-Capillary: 259 mg/dL — ABNORMAL HIGH (ref 70–99)
Glucose-Capillary: 208 mg/dL — ABNORMAL HIGH (ref 70–99)
Glucose-Capillary: 228 mg/dL — ABNORMAL HIGH (ref 70–99)
Glucose-Capillary: 287 mg/dL — ABNORMAL HIGH (ref 70–99)
Glucose-Capillary: 275 mg/dL — ABNORMAL HIGH (ref 70–99)
Glucose-Capillary: 217 mg/dL — ABNORMAL HIGH (ref 70–99)

## 2020-12-28 LAB — SURGICAL PCR SCREEN
MRSA, PCR: NEGATIVE
Staphylococcus aureus: NEGATIVE

## 2020-12-28 LAB — FIBRINOGEN
Fibrinogen: 403 mg/dL (ref 210–475)
Fibrinogen: 438 mg/dL (ref 210–475)
Fibrinogen: 437 mg/dL (ref 210–475)

## 2020-12-28 LAB — HEPARIN LEVEL (UNFRACTIONATED)
Heparin Unfractionated: 0.1 IU/mL — ABNORMAL LOW (ref 0.30–0.70)
Heparin Unfractionated: 0.28 IU/mL — ABNORMAL LOW (ref 0.30–0.70)
Heparin Unfractionated: 0.34 IU/mL (ref 0.30–0.70)
Heparin Unfractionated: 1.3 IU/mL — ABNORMAL HIGH (ref 0.30–0.70)

## 2020-12-28 LAB — HCG, QUANTITATIVE, PREGNANCY: hCG, Beta Chain, Quant, S: 1 m[IU]/mL (ref <5)

## 2020-12-28 SURGERY — ANGIOGRAM, LOWER EXTREMITY
Anesthesia: General | Site: Arm Upper | Laterality: Left

## 2020-12-28 MED ORDER — SODIUM CHLORIDE 0.9 % IV SOLN
1.0000 mg/h | INTRAVENOUS | Status: DC
  Filled 2020-12-28 (×3): qty 10

## 2020-12-28 MED ORDER — SODIUM CHLORIDE 0.9 % IV SOLN
INTRAVENOUS | Status: AC
  Filled 2020-12-28: qty 1.2

## 2020-12-28 MED ORDER — SODIUM CHLORIDE 0.9 % IV SOLN
INTRAVENOUS | Status: DC | PRN
  Administered 2020-12-28: 500 mL

## 2020-12-28 MED ORDER — CHLORHEXIDINE GLUCONATE 0.12 % MT SOLN
OROMUCOSAL | Status: AC
  Administered 2020-12-28: 15 mL via OROMUCOSAL
  Filled 2020-12-28: qty 15

## 2020-12-28 MED ORDER — SUGAMMADEX SODIUM 200 MG/2ML IV SOLN
INTRAVENOUS | Status: DC | PRN
  Administered 2020-12-28: 400 mg via INTRAVENOUS

## 2020-12-28 MED ORDER — SODIUM CHLORIDE 0.9% FLUSH
3.0000 mL | INTRAVENOUS | Status: DC | PRN
Start: 2020-12-28 — End: 2020-12-29

## 2020-12-28 MED ORDER — HEPARIN BOLUS VIA INFUSION
3000.0000 [IU] | Freq: Once | INTRAVENOUS | Status: AC
  Administered 2020-12-28: 3000 [IU] via INTRAVENOUS
  Filled 2020-12-28: qty 3000

## 2020-12-28 MED ORDER — HEPARIN (PORCINE) 25000 UT/250ML-% IV SOLN
1100.0000 [IU]/h | INTRAVENOUS | Status: DC
  Administered 2020-12-28: 1050 [IU]/h via INTRAVENOUS
  Administered 2020-12-29: 1100 [IU]/h via INTRAVENOUS
  Filled 2020-12-28 (×2): qty 250

## 2020-12-28 MED ORDER — MIDAZOLAM HCL 2 MG/2ML IJ SOLN
INTRAMUSCULAR | Status: DC | PRN
  Administered 2020-12-28: 2 mg via INTRAVENOUS

## 2020-12-28 MED ORDER — FENTANYL CITRATE (PF) 250 MCG/5ML IJ SOLN
INTRAMUSCULAR | Status: AC
  Filled 2020-12-28: qty 5

## 2020-12-28 MED ORDER — ROCURONIUM BROMIDE 10 MG/ML (PF) SYRINGE
PREFILLED_SYRINGE | INTRAVENOUS | Status: DC | PRN
  Administered 2020-12-28: 50 mg via INTRAVENOUS
  Administered 2020-12-28: 30 mg via INTRAVENOUS

## 2020-12-28 MED ORDER — HEPARIN SODIUM (PORCINE) 1000 UNIT/ML IJ SOLN
INTRAMUSCULAR | Status: DC | PRN
  Administered 2020-12-28: 10000 [IU] via INTRAVENOUS
  Administered 2020-12-28: 2000 [IU] via INTRAVENOUS

## 2020-12-28 MED ORDER — LIDOCAINE HCL (PF) 1 % IJ SOLN
INTRAMUSCULAR | Status: AC
  Filled 2020-12-28: qty 30

## 2020-12-28 MED ORDER — CHLORHEXIDINE GLUCONATE CLOTH 2 % EX PADS
6.0000 | MEDICATED_PAD | Freq: Every day | CUTANEOUS | Status: DC
  Administered 2020-12-28 – 2020-12-29 (×2): 6 via TOPICAL

## 2020-12-28 MED ORDER — ONDANSETRON HCL 4 MG/2ML IJ SOLN
INTRAMUSCULAR | Status: AC
  Filled 2020-12-28: qty 2

## 2020-12-28 MED ORDER — SODIUM CHLORIDE 0.9 % IV SOLN: 250.0000 mL | INTRAVENOUS | Status: DC | PRN

## 2020-12-28 MED ORDER — SODIUM CHLORIDE 0.9 % IV SOLN
INTRAVENOUS | Status: DC | PRN
Start: 2020-12-28 — End: 2020-12-28

## 2020-12-28 MED ORDER — CLINDAMYCIN PHOSPHATE 900 MG/50ML IV SOLN
900.0000 mg | INTRAVENOUS | Status: AC
  Administered 2020-12-28: 900 mg via INTRAVENOUS
  Filled 2020-12-28: qty 50

## 2020-12-28 MED ORDER — HEPARIN (PORCINE) 25000 UT/250ML-% IV SOLN
500.0000 [IU]/h | INTRAVENOUS | Status: DC
  Filled 2020-12-28: qty 250

## 2020-12-28 MED ORDER — PHENYLEPHRINE HCL-NACL 10-0.9 MG/250ML-% IV SOLN
INTRAVENOUS | Status: DC | PRN
  Administered 2020-12-28: 50 ug/min via INTRAVENOUS

## 2020-12-28 MED ORDER — MIDAZOLAM HCL 2 MG/2ML IJ SOLN
INTRAMUSCULAR | Status: AC
  Filled 2020-12-28: qty 2

## 2020-12-28 MED ORDER — FENTANYL CITRATE (PF) 100 MCG/2ML IJ SOLN: 25.0000 ug | INTRAMUSCULAR | Status: DC | PRN

## 2020-12-28 MED ORDER — SODIUM CHLORIDE 0.9 % IV SOLN
10.0000 mg | INTRAVENOUS | Status: AC
  Administered 2020-12-28: 1 mg
  Filled 2020-12-28: qty 10

## 2020-12-28 MED ORDER — CLEVIDIPINE BUTYRATE 0.5 MG/ML IV EMUL
0.0000 mg/h | INTRAVENOUS | Status: DC
  Filled 2020-12-28: qty 50

## 2020-12-28 MED ORDER — PROPOFOL 10 MG/ML IV BOLUS
INTRAVENOUS | Status: DC | PRN
  Administered 2020-12-28: 130 mg via INTRAVENOUS

## 2020-12-28 MED ORDER — CHLORHEXIDINE GLUCONATE 0.12 % MT SOLN: 15.0000 mL | Freq: Once | OROMUCOSAL | Status: AC

## 2020-12-28 MED ORDER — SODIUM CHLORIDE 0.9% FLUSH
3.0000 mL | Freq: Two times a day (BID) | INTRAVENOUS | Status: DC
  Administered 2020-12-28: 3 mL via INTRAVENOUS

## 2020-12-28 MED ORDER — DEXAMETHASONE SODIUM PHOSPHATE 10 MG/ML IJ SOLN
INTRAMUSCULAR | Status: AC
  Filled 2020-12-28: qty 1

## 2020-12-28 MED ORDER — LIDOCAINE 2% (20 MG/ML) 5 ML SYRINGE
INTRAMUSCULAR | Status: AC
  Filled 2020-12-28: qty 5

## 2020-12-28 MED ORDER — LIDOCAINE 2% (20 MG/ML) 5 ML SYRINGE
INTRAMUSCULAR | Status: DC | PRN
  Administered 2020-12-28: 40 mg via INTRAVENOUS

## 2020-12-28 MED ORDER — SODIUM CHLORIDE 0.9 % IV SOLN
1.0000 mg/h | INTRAVENOUS | Status: DC
  Administered 2020-12-28 – 2020-12-29 (×2): 1 mg/h
  Filled 2020-12-28 (×6): qty 10

## 2020-12-28 MED ORDER — FENTANYL CITRATE (PF) 250 MCG/5ML IJ SOLN
INTRAMUSCULAR | Status: DC | PRN
  Administered 2020-12-28: 100 ug via INTRAVENOUS

## 2020-12-28 MED ORDER — PHENYLEPHRINE 40 MCG/ML (10ML) SYRINGE FOR IV PUSH (FOR BLOOD PRESSURE SUPPORT)
PREFILLED_SYRINGE | INTRAVENOUS | Status: DC | PRN
  Administered 2020-12-28 (×3): 120 ug via INTRAVENOUS

## 2020-12-28 MED ORDER — IODIXANOL 320 MG/ML IV SOLN
INTRAVENOUS | Status: DC | PRN
  Administered 2020-12-28: 20 mL

## 2020-12-28 MED ORDER — PROPOFOL 10 MG/ML IV BOLUS
INTRAVENOUS | Status: AC
  Filled 2020-12-28: qty 40

## 2020-12-28 MED ORDER — NICARDIPINE HCL IN NACL 20-0.86 MG/200ML-% IV SOLN: 5.0000 mg/h | INTRAVENOUS | Status: DC

## 2020-12-28 MED ORDER — SODIUM CHLORIDE 0.9 % IV SOLN: INTRAVENOUS | Status: DC

## 2020-12-28 MED ORDER — HEPARIN (PORCINE) 25000 UT/250ML-% IV SOLN
INTRAVENOUS | Status: DC | PRN
  Administered 2020-12-28: 500 [IU]/h via INTRAVENOUS

## 2020-12-28 MED ORDER — ROCURONIUM BROMIDE 10 MG/ML (PF) SYRINGE
PREFILLED_SYRINGE | INTRAVENOUS | Status: AC
  Filled 2020-12-28: qty 10

## 2020-12-28 MED ORDER — EPHEDRINE SULFATE-NACL 50-0.9 MG/10ML-% IV SOSY
PREFILLED_SYRINGE | INTRAVENOUS | Status: DC | PRN
  Administered 2020-12-28: 5 mg via INTRAVENOUS

## 2020-12-28 MED ORDER — ONDANSETRON HCL 4 MG/2ML IJ SOLN
INTRAMUSCULAR | Status: DC | PRN
  Administered 2020-12-28: 4 mg via INTRAVENOUS

## 2020-12-28 SURGICAL SUPPLY — 49 items
BAG BANDED W/RUBBER/TAPE 36X54 (MISCELLANEOUS) ×2 IMPLANT
BAG SNAP BAND KOVER 36X36 (MISCELLANEOUS) ×2 IMPLANT
BIOPATCH RED 1 DISK 7.0 (GAUZE/BANDAGES/DRESSINGS) ×2 IMPLANT
CANISTER SUCT 3000ML PPV (MISCELLANEOUS) ×2 IMPLANT
CATH ACCU-VU SIZ PIG 5F 100CM (CATHETERS) ×2 IMPLANT
CATH ANGIO 5F BER2 65CM (CATHETERS) IMPLANT
CATH INFUS 135CMX30CM (CATHETERS) ×2 IMPLANT
CATH QUICKCROSS .035X135CM (MICROCATHETER) ×2 IMPLANT
CHLORAPREP W/TINT 26 (MISCELLANEOUS) ×2 IMPLANT
COVER DOME SNAP 22 D (MISCELLANEOUS) ×2 IMPLANT
COVER PROBE W GEL 5X96 (DRAPES) ×2 IMPLANT
COVER WAND RF STERILE (DRAPES) ×2 IMPLANT
DERMABOND ADVANCED (GAUZE/BANDAGES/DRESSINGS) ×1
DERMABOND ADVANCED .7 DNX12 (GAUZE/BANDAGES/DRESSINGS) ×1 IMPLANT
DEVICE TORQUE H2O (MISCELLANEOUS) ×2 IMPLANT
DEVICE TORQUE KENDALL .025-038 (MISCELLANEOUS) IMPLANT
DRAPE FEMORAL ANGIO 80X135IN (DRAPES) ×2 IMPLANT
DRAPE ORTHO SPLIT 77X108 STRL (DRAPES) ×2
DRAPE SURG ORHT 6 SPLT 77X108 (DRAPES) ×1 IMPLANT
ELECT REM PT RETURN 9FT ADLT (ELECTROSURGICAL)
ELECTRODE REM PT RTRN 9FT ADLT (ELECTROSURGICAL) IMPLANT
GAUZE 4X4 16PLY RFD (DISPOSABLE) ×2 IMPLANT
GAUZE SPONGE 4X4 12PLY STRL LF (GAUZE/BANDAGES/DRESSINGS) ×2 IMPLANT
GLIDEWIRE ADV .035X180CM (WIRE) ×2 IMPLANT
GLOVE BIO SURGEON STRL SZ7.5 (GLOVE) ×2 IMPLANT
GOWN STRL REUS W/ TWL LRG LVL3 (GOWN DISPOSABLE) ×3 IMPLANT
GOWN STRL REUS W/TWL LRG LVL3 (GOWN DISPOSABLE) ×6
GUIDEWIRE ANGLED .035X150CM (WIRE) IMPLANT
KIT BASIN OR (CUSTOM PROCEDURE TRAY) ×2 IMPLANT
KIT TURNOVER KIT B (KITS) ×2 IMPLANT
NEEDLE PERC 18GX7CM (NEEDLE) ×2 IMPLANT
NS IRRIG 1000ML POUR BTL (IV SOLUTION) ×2 IMPLANT
PACK SURGICAL SETUP 50X90 (CUSTOM PROCEDURE TRAY) ×2 IMPLANT
PROTECTION STATION PRESSURIZED (MISCELLANEOUS) ×2
SET MICROPUNCTURE 5F STIFF (MISCELLANEOUS) ×2 IMPLANT
SHEATH AVANTI 11CM 5FR (SHEATH) ×2 IMPLANT
SHEATH PINNACLE 5F 10CM (SHEATH) ×4 IMPLANT
SHEATH PINNACLE ST 6F 65CM (SHEATH) ×2 IMPLANT
STATION PROTECTION PRESSURIZED (MISCELLANEOUS) ×1 IMPLANT
STOPCOCK MORSE 400PSI 3WAY (MISCELLANEOUS) ×2 IMPLANT
SYR 10ML LL (SYRINGE) ×8 IMPLANT
SYR 20ML LL LF (SYRINGE) ×4 IMPLANT
SYR MEDRAD MARK V 150ML (SYRINGE) IMPLANT
TOWEL GREEN STERILE (TOWEL DISPOSABLE) ×4 IMPLANT
TRAY FOLEY MTR SLVR 16FR STAT (SET/KITS/TRAYS/PACK) ×2 IMPLANT
TUBING HIGH PRESSURE 120CM (CONNECTOR) IMPLANT
TUBING INJECTOR 48 (MISCELLANEOUS) ×2 IMPLANT
WIRE BENTSON .035X145CM (WIRE) ×2 IMPLANT
WIRE HI TORQ VERSACORE J 260CM (WIRE) ×2 IMPLANT

## 2020-12-28 NOTE — Progress Notes (Addendum)
[place] for Heparin Indication: DVT and inschemic limb  Allergies  Allergen Reactions  . Ciprofloxacin Hcl Hives  . Dilaudid [Hydromorphone Hcl] Shortness Of Breath and Other (See Comments)    "Asystole," per pt report  . Macrobid [Nitrofurantoin Macrocrystal] Hives, Shortness Of Breath and Rash  . Other Hives, Shortness Of Breath and Rash    NO "-CILLINS"!!!  . Penicillins Shortness Of Breath    Had had cephalosporins without incident Has patient had a PCN reaction causing immediate rash, facial/tongue/throat swelling, SOB or lightheadedness with hypotension: Yes Has patient had a PCN reaction causing severe rash involving mucus membranes or skin necrosis: Unk Has patient had a PCN reaction that required hospitalization: Unk Has patient had a PCN reaction occurring within the last 10 years: No If all of the above answers are "NO", then may proceed with Cephalosporin use.   . Sulfa Antibiotics Hives, Shortness Of Breath and Rash  . Lexapro [Escitalopram Oxalate] Other (See Comments)    "I just did not like it."    Patient Measurements: Height: '5\' 7"'$  (170.2 cm) Weight: 118.3 kg (260 lb 11.2 oz) IBW/kg (Calculated) : 61.6 Heparin Dosing Weight: 86.8 kg  Vital Signs: Temp: 98.3 F (36.8 C) (03/27 0959) Temp Source: Oral (03/27 0352) BP: 169/83 (03/27 0959) Pulse Rate: 97 (03/27 0959)  Labs: Recent Labs    12/27/20 1006 12/27/20 1113 12/27/20 1243 12/27/20 1453 12/28/20 0021  HGB  --  13.1  --   --  12.4  HCT  --  40.6  --   --  39.4  PLT  --  446*  --   --  376  LABPROT 11.9  --  12.5  --   --   INR 0.9  --  1.0  --   --   HEPARINUNFRC  --   --   --  0.27* 0.10*  CREATININE 1.85*  --   --   --  2.00*    Estimated Creatinine Clearance: 47.3 mL/min (A) (by C-G formula based on SCr of 2 mg/dL (H)).  Assessment: 45 y.o. female with LLE ischemia for heparin.  Now back from [place] asked to adjust insulin to target  heparin level 0.2-0.5.  Was previously 0.1 on 1500 units/hr.  Also with orders for TPA infusion.  No overt bleeding or complications noted.  Goal of Therapy:  Heparin level 0.3-0.7 units/ml Monitor platelets by anticoagulation protocol: Yes   Plan:  Increase IV heparin to 1550 units/hr. - Per Dr. Kathaleen Grinder instruction , will run 500 units/hr of heparin through sheath, and then add peripheral heparin at 1050 units/hr for titration to goal level.  Check heparin level q 6 hrs per protocol. Daily heparin level and CBC.  [place] Crane, Roylene Reason, [facility] Clinical Pharmacist  12/28/2020 10:39 AM   [facility] pharmacy phone numbers are listed on [email or web address]

## 2020-12-28 NOTE — Progress Notes (Signed)
[place] for Heparin Indication: DVT and inschemic limb  Allergies  Allergen Reactions  . Ciprofloxacin Hcl Hives  . Dilaudid [Hydromorphone Hcl] Shortness Of Breath and Other (See Comments)    "Asystole," per pt report  . Macrobid [Nitrofurantoin Macrocrystal] Hives, Shortness Of Breath and Rash  . Other Hives, Shortness Of Breath and Rash    NO "-CILLINS"!!!  . Penicillins Shortness Of Breath    Had had cephalosporins without incident Has patient had a PCN reaction causing immediate rash, facial/tongue/throat swelling, SOB or lightheadedness with hypotension: Yes Has patient had a PCN reaction causing severe rash involving mucus membranes or skin necrosis: Unk Has patient had a PCN reaction that required hospitalization: Unk Has patient had a PCN reaction occurring within the last 10 years: No If all of the above answers are "NO", then may proceed with Cephalosporin use.   . Sulfa Antibiotics Hives, Shortness Of Breath and Rash  . Lexapro [Escitalopram Oxalate] Other (See Comments)    "I just did not like it."    Patient Measurements: Height: '5\' 7"'$  (170.2 cm) Weight: 118.3 kg (260 lb 11.2 oz) IBW/kg (Calculated) : 61.6 Heparin Dosing Weight: 86.8 kg  Vital Signs: Temp: 97.7 F (36.5 C) (03/27 1500) Temp Source: Oral (03/27 1100) BP: 177/94 (03/27 1600) Pulse Rate: 91 (03/27 1600)  Labs: Recent Labs    12/27/20 1006 12/27/20 1113 12/27/20 1243 12/27/20 1453 12/28/20 0021 12/28/20 1026 12/28/20 1652  HGB  --    < >  --   --  12.4 12.6 12.6  HCT  --    < >  --   --  39.4 40.9 40.5  PLT  --    < >  --   --  376 381 315  LABPROT 11.9  --  12.5  --   --   --   --   INR 0.9  --  1.0  --   --   --   --   HEPARINUNFRC  --   --   --    < > 0.10* 1.30* 0.34  CREATININE 1.85*  --   --   --  2.00*  --   --    < > = values in this interval not displayed.    Estimated Creatinine Clearance: 47.3 mL/min (A) (by C-G formula based on SCr  of 2 mg/dL (H)).  Assessment: 46 y.o. female with LLE ischemia for heparin. She is s/p OR on lysis with alteplase. Heparin 500 units/hr is going through the sheath and she is on 1050 units/hr of systemic heparin -heparin level at goal, hg= 12.6, fibrinogen 275    Goal of Therapy:  Heparin level 0.3-0.7 units/ml Monitor platelets by anticoagulation protocol: Yes   Plan:  -Continue systemic heparin at 1050 units/hr -No titration of heparin through the sheath -Q6h heparin level, fibrinogen and CBC per protocol  Hildred Laser, PharmD Clinical Pharmacist **Pharmacist phone directory can now be found on [email or web address] ([email or web address]).  Listed under [place].

## 2020-12-28 NOTE — Plan of Care (Signed)
°  Problem: Education: °Goal: Knowledge of General Education information will improve °Description: Including pain rating scale, medication(s)/side effects and non-pharmacologic comfort measures °Outcome: Progressing °  °Problem: Health Behavior/Discharge Planning: °Goal: Ability to manage health-related needs will improve °Outcome: Progressing °  °Problem: Clinical Measurements: °Goal: Ability to maintain clinical measurements within normal limits will improve °Outcome: Progressing °Goal: Will remain free from infection °Outcome: Progressing °Goal: Diagnostic test results will improve °Outcome: Progressing °Goal: Respiratory complications will improve °Outcome: Progressing °Goal: Cardiovascular complication will be avoided °Outcome: Progressing °  °Problem: Activity: °Goal: Risk for activity intolerance will decrease °Outcome: Progressing °  °Problem: Nutrition: °Goal: Adequate nutrition will be maintained °Outcome: Progressing °  °Problem: Coping: °Goal: Level of anxiety will decrease °Outcome: Progressing °  °Problem: Elimination: °Goal: Will not experience complications related to bowel motility °Outcome: Progressing °Goal: Will not experience complications related to urinary retention °Outcome: Progressing °  °Problem: Pain Managment: °Goal: General experience of comfort will improve °Outcome: Progressing °  °Problem: Safety: °Goal: Ability to remain free from injury will improve °Outcome: Progressing °  °Problem: Skin Integrity: °Goal: Risk for impaired skin integrity will decrease °Outcome: Progressing °  °Problem: Metabolic: °Goal: Ability to maintain appropriate glucose levels will improve °Outcome: Progressing °  °

## 2020-12-28 NOTE — Anesthesia Preprocedure Evaluation (Signed)
Anesthesia Evaluation  Patient identified by MRN, date of birth, ID band Patient awake    Reviewed: Allergy & Precautions, NPO status , Patient's Chart, lab work & pertinent test results  Airway Mallampati: II   Neck ROM: Full    Dental  (+) Poor Dentition,    Pulmonary Current Smoker and Patient abstained from smoking.,    breath sounds clear to auscultation       Cardiovascular hypertension,  Rhythm:Regular Rate:Normal     Neuro/Psych    GI/Hepatic   Endo/Other  diabetes  Renal/GU      Musculoskeletal   Abdominal   Peds  Hematology   Anesthesia Other Findings   Reproductive/Obstetrics                             Anesthesia Physical Anesthesia Plan  ASA: III  Anesthesia Plan: General   Post-op Pain Management:    Induction: Intravenous  PONV Risk Score and Plan: Ondansetron and Metaclopromide  Airway Management Planned: Oral ETT  Additional Equipment: Ultrasound Guidance Line Placement and CVP  Intra-op Plan:   Post-operative Plan: Extubation in OR  Informed Consent: I have reviewed the patients History and Physical, chart, labs and discussed the procedure including the risks, benefits and alternatives for the proposed anesthesia with the patient or authorized representative who has indicated his/her understanding and acceptance.     Dental advisory given  Plan Discussed with: CRNA and Anesthesiologist  Anesthesia Plan Comments:         Anesthesia Quick Evaluation

## 2020-12-28 NOTE — Progress Notes (Signed)
The patient's left foot appears cyanotic and cool to touch.  Unable to find a pulse on the foot with the doppler machine but that was found earlier.  Informed the physician,  Dr. Stanford Breed, but he ordered to just continue to monitor the patient, continue with the continuous IV Heparin drip, and try to keep the patient as comfortable as possible since she is going to surgery in the morning.  Will continue to monitor.  Lupita Dawn, RN

## 2020-12-28 NOTE — Op Note (Signed)
DATE OF SERVICE: 12/28/2020  PATIENT:  Terri Sparks  45 y.o. 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  TOURNIQUET:  None  PATIENT DISPOSITION:  PACU - hemodynamically stable.   Delay start of Pharmacological VTE agent (>24hrs) due to surgical blood loss or risk of bleeding: no  INDICATION FOR PROCEDURE: Terri Sparks is a 45 y.o. female with thrombosis of left common iliac stent. 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 After careful discussion of risks, benefits, and alternatives the patient was offered thrombolysis. We specifically discussed risk of bleeding complications. The patient understood and wished to proceed.  OPERATIVE FINDINGS: occlusion of left common iliac stent. Reconstitution of femoral bifurcation. Thrombolysis catheter placed across lesion from terminal aorta to left superficial femoral artery. Thromobolysis to proceed per routine. Takeback tomorrow for catheter check and possible intervention with Dr. Donzetta Matters in the cath lab.  DESCRIPTION OF PROCEDURE: After identification of the patient in the pre-operative holding area, the patient was transferred to the operating room. The patient was positioned supine on the operating room table. Anesthesia was induced. The left arm was prepped and draped in standard fashion. A surgical pause was  performed confirming correct patient, procedure, and operative location.  Using ultrasound guidance the left brachial artery was accessed using micropuncture technique.  Through the micro sheath a Glidewire advantage was navigated to the left subclavian artery.  The micro sheath was exchanged for a 5 [place] sheath.  Over the wire a pigtail catheter was advanced.  The wire was advanced into the arch of the aorta.  The pigtail catheter was used to redirect the wire into the descending thoracic aorta.  The wire was advanced to the terminal aorta.  Over the wire the pigtail catheter was advanced.  The wire was removed and abdominal aortogram was performed.  This revealed, as is expected, a occluded left common iliac artery stent.  I saw some suggestion of distal reconstitution of flow, suggesting this was a focal thrombosis.  The Glidewire was advanced through the lesion.  Minimal resistance was felt.  Over the wire the catheter and sheath were removed.  A 47F x 65cm destination catheter was delivered into the abdominal aorta.  A quick cross catheter was advanced over the wire into the femoral arteries.  The wire was withdrawn and an angiogram performed distally confirming intraluminal position.  Satisfied, wire was advanced into the superficial femoral artery.  A 30 cm treatment length UniFuse catheter was advanced over the wire into the area of interest.  We confirmed the treatment length began above the lesion and continued into the left leg.  Satisfied we ended the case here.  The wire was withdrawn from the UniFuse catheter and the obturator introduced.  We initiated nontitrating heparin infusion through the sheath.  We initiated thrombolysis through the intra-arterial catheter at 1 mg an hour of TPA.  The sheath  and catheter were secured to the arm using Tegaderm dressings, an armboard, and Coban.  She was allowed to emerge from anesthesia and transferred to the ICU in good condition.  I was present for all parts  of the procedure.  All counts were confirmed correct.  [name]. Stanford Breed, MD [facility] Phone Number: [phone] 12/28/2020 9:13 AM

## 2020-12-28 NOTE — Anesthesia Postprocedure Evaluation (Signed)
Anesthesia Post Note  Patient: Terri Sparks  Procedure(s) Performed: AORTOGRAM, LEFT LOWER EXTREMITY ANGIOGRAM, THROMBOLYSIS VIA LEFT BRACHIAL ARTERY APPROACH (Left Arm Upper)     Patient location during evaluation: PACU Anesthesia Type: General Level of consciousness: awake and alert Pain management: pain level controlled Vital Signs Assessment: post-procedure vital signs reviewed and stable Respiratory status: spontaneous breathing, nonlabored ventilation, respiratory function stable and patient connected to nasal cannula oxygen Cardiovascular status: blood pressure returned to baseline and stable Postop Assessment: no apparent nausea or vomiting Anesthetic complications: no   No complications documented.  Last Vitals:  Vitals:   12/28/20 2045 12/28/20 2100  BP: 121/74 (!) 133/95  Pulse: 82 87  Resp: 16 15  Temp:    SpO2: 98% 97%    Last Pain:  Vitals:   12/28/20 2121  TempSrc:   PainSc: Asleep                 Lonnel Gjerde COKER

## 2020-12-28 NOTE — Progress Notes (Signed)
[place] for Heparin Indication: DVT and inschemic limb  Allergies  Allergen Reactions  . Ciprofloxacin Hcl Hives  . Dilaudid [Hydromorphone Hcl] Shortness Of Breath and Other (See Comments)    "Asystole," per pt report  . Macrobid [Nitrofurantoin Macrocrystal] Hives, Shortness Of Breath and Rash  . Other Hives, Shortness Of Breath and Rash    NO "-CILLINS"!!!  . Penicillins Shortness Of Breath    Had had cephalosporins without incident Has patient had a PCN reaction causing immediate rash, facial/tongue/throat swelling, SOB or lightheadedness with hypotension: Yes Has patient had a PCN reaction causing severe rash involving mucus membranes or skin necrosis: Unk Has patient had a PCN reaction that required hospitalization: Unk Has patient had a PCN reaction occurring within the last 10 years: No If all of the above answers are "NO", then may proceed with Cephalosporin use.   . Sulfa Antibiotics Hives, Shortness Of Breath and Rash  . Lexapro [Escitalopram Oxalate] Other (See Comments)    "I just did not like it."    Patient Measurements: Height: '5\' 7"'$  (170.2 cm) Weight: 118.3 kg (260 lb 11.2 oz) IBW/kg (Calculated) : 61.6 Heparin Dosing Weight: 86.8 kg  Vital Signs: Temp: 97.8 F (36.6 C) (03/27 0000) Temp Source: Oral (03/26 1957) BP: 151/67 (03/27 0049) Pulse Rate: 90 (03/27 0000)  Labs: Recent Labs    12/27/20 1006 12/27/20 1113 12/27/20 1243 12/27/20 1453 12/28/20 0021  HGB  --  13.1  --   --  12.4  HCT  --  40.6  --   --  39.4  PLT  --  446*  --   --  376  LABPROT 11.9  --  12.5  --   --   INR 0.9  --  1.0  --   --   HEPARINUNFRC  --   --   --  0.27* 0.10*  CREATININE 1.85*  --   --   --   --     Estimated Creatinine Clearance: 51.1 mL/min (A) (by C-G formula based on SCr of 1.85 mg/dL (H)).  Assessment: 45 y.o. female with LLE ischemia for heparin  Goal of Therapy:  Heparin level 0.3-0.7 units/ml Monitor  platelets by anticoagulation protocol: Yes   Plan:  Heparin 3000 units IV bolus, then increase heparin  1850 units/hr F/U after OR   Phillis Knack, PharmD, BCPS

## 2020-12-28 NOTE — Progress Notes (Signed)
[facility] [place] PROGRESS NOTE  ASSESSMENT / PLAN: Terri Sparks is a 45 y.o. female with likely occluded left iliac stent causing rutherford 1 acute limb ischemia. Duplex yesterday suggests patent infrainguinal arteries. Plan aortogram, left lower angiogram, and initiation of thrombolysis via left brachial artery access today in OR.    SUBJECTIVE: No change in left leg symptoms. Pain continues. No new neurologic symptoms.    OBJECTIVE: BP (!) 154/77 (BP Location: Left Arm)   Pulse 86   Temp 98.3 F (36.8 C) (Oral)   Resp 20   Ht '5\' 7"'$  (1.702 m)   Wt 118.3 kg   LMP 12/21/2020   SpO2 100%   BMI 40.83 kg/m   Intake/Output Summary (Last 24 hours) at 12/28/2020 0724 Last data filed at 12/28/2020 0515 Gross per 24 hour  Intake 1944.46 ml  Output -  Net 1944.46 ml    No distress RRR Unlabored No pedal pulses in LLE  CBC Latest Ref Rng & Units 12/28/2020 12/27/2020 04/01/2020  WBC 4.0 - 10.5 K/uL 13.0(H) 14.3(H) 11.5(H)  Hemoglobin 12.0 - 15.0 g/dL 12.4 13.1 14.3  Hematocrit 36.0 - 46.0 % 39.4 40.6 44.3  Platelets 150 - 400 K/uL 376 446(H) 347     CMP Latest Ref Rng & Units 12/28/2020 12/27/2020 07/29/2020  Glucose 70 - 99 mg/dL 333(H) 438(H) 323(H)  BUN 6 - 20 mg/dL '18 16 19  '$ Creatinine 0.44 - 1.00 mg/dL 2.00(H) 1.85(H) 2.23(H)  Sodium 135 - 145 mmol/L 130(L) 130(L) 135  Potassium 3.5 - 5.1 mmol/L 4.5 3.7 4.6  Chloride 98 - 111 mmol/L 96(L) 96(L) 100  CO2 22 - 32 mmol/L '28 26 23  '$ Calcium 8.9 - 10.3 mg/dL 9.3 9.8 9.7  Total Protein 6.5 - 8.1 g/dL 6.3(L) 7.0 7.4  Total Bilirubin 0.3 - 1.2 mg/dL 0.3 0.4 <0.2  Alkaline Phos 38 - 126 U/L 74 93 131(H)  AST 15 - 41 U/L 15 13(L) 6  ALT 0 - 44 U/L '10 11 10    '$ Estimated Creatinine Clearance: 47.3 mL/min (A) (by C-G formula based on SCr of 2 mg/dL (H)).  [name]. Stanford Breed, MD [facility] Phone Number: [phone] 12/28/2020 7:24 AM

## 2020-12-28 NOTE — Transfer of Care (Signed)
Immediate Anesthesia Transfer of Care Note  Patient: Terri Sparks  Procedure(s) Performed: AORTOGRAM, LEFT LOWER EXTREMITY ANGIOGRAM, THROMBOLYSIS VIA LEFT BRACHIAL ARTERY APPROACH (Left Arm Upper)  Patient Location: PACU  Anesthesia Type:General  Level of Consciousness: awake, alert , oriented and patient cooperative  Airway & Oxygen Therapy: Patient Spontanous Breathing and Patient connected to face mask oxygen  Post-op Assessment: Report given to RN, Post -op Vital signs reviewed and stable and Patient moving all extremities  Post vital signs: Reviewed and stable  Last Vitals:  Vitals Value Taken Time  BP 162/86 12/28/20 0929  Temp    Pulse 96 12/28/20 0930  Resp 34 12/28/20 0930  SpO2 97 % 12/28/20 0930  Vitals shown include unvalidated device data.  Last Pain:  Vitals:   12/28/20 0445  TempSrc:   PainSc: 9       Patients Stated Pain Goal: 6 ([id number])  Complications: No complications documented.

## 2020-12-28 NOTE — Progress Notes (Signed)
Multiple RN's unable to doppler a LLE pulse. Dr. Stanford Breed paged and RN notified that pulse only detectable on [name] and should return over the next 24 hours.

## 2020-12-28 NOTE — Anesthesia Procedure Notes (Addendum)
Procedure Name: Intubation Date/Time: 12/28/2020 7:53 AM Performed by: Rande Brunt, CRNA Pre-anesthesia Checklist: Patient identified, Emergency Drugs available, Suction available and Patient being monitored Patient Re-evaluated:Patient Re-evaluated prior to induction Oxygen Delivery Method: Circle System Utilized Preoxygenation: Pre-oxygenation with 100% oxygen Induction Type: IV induction and Inhalational induction Ventilation: Mask ventilation without difficulty and Oral airway inserted - appropriate to patient size Laryngoscope Size: Mac and 4 Grade View: Grade II Tube type: Oral Tube size: 7.0 mm Number of attempts: 1 Airway Equipment and Method: Stylet and Oral airway Placement Confirmation: ETT inserted through vocal cords under direct vision,  positive ETCO2 and breath sounds checked- equal and bilateral Secured at: 22 cm Tube secured with: Tape Dental Injury: Teeth and Oropharynx as per pre-operative assessment

## 2020-12-29 ENCOUNTER — Encounter (HOSPITAL_COMMUNITY): Payer: Self-pay | Admitting: Vascular Surgery

## 2020-12-29 ENCOUNTER — Encounter (HOSPITAL_COMMUNITY)
Admission: EM | Disposition: A | Discharge status: Home / Self Care | Payer: Self-pay | Source: Home / Self Care | Attending: Vascular Surgery

## 2020-12-29 DIAGNOSIS — T82868A Thrombosis of vascular prosthetic devices, implants and grafts, initial encounter: Secondary | ICD-10-CM

## 2020-12-29 HISTORY — PX: LOWER EXTREMITY ANGIOGRAPHY: CATH118251

## 2020-12-29 HISTORY — PX: PERIPHERAL VASCULAR THROMBECTOMY: CATH118306

## 2020-12-29 LAB — BASIC METABOLIC PANEL
Anion gap: 7 (ref 5–15)
BUN: 10 mg/dL (ref 6–20)
CO2: 22 mmol/L (ref 22–32)
Calcium: 8.6 mg/dL — ABNORMAL LOW (ref 8.9–10.3)
Chloride: 105 mmol/L (ref 98–111)
Creatinine, Ser: 1.56 mg/dL — ABNORMAL HIGH (ref 0.44–1.00)
GFR, Estimated: 42 mL/min — ABNORMAL LOW (ref >60)
Glucose, Bld: 207 mg/dL — ABNORMAL HIGH (ref 70–99)
Potassium: 4.6 mmol/L (ref 3.5–5.1)
Sodium: 134 mmol/L — ABNORMAL LOW (ref 135–145)

## 2020-12-29 LAB — CBC
HCT: 39.4 % (ref 36.0–46.0)
Hemoglobin: 12.5 g/dL (ref 12.0–15.0)
MCH: 26.5 pg (ref 26.0–34.0)
MCHC: 31.7 g/dL (ref 30.0–36.0)
MCV: 83.5 fL (ref 80.0–100.0)
Platelets: 304 10*3/uL (ref 150–400)
RBC: 4.72 MIL/uL (ref 3.87–5.11)
RDW: 16.2 % — ABNORMAL HIGH (ref 11.5–15.5)
WBC: 15 10*3/uL — ABNORMAL HIGH (ref 4.0–10.5)
nRBC: 0 % (ref 0.0–0.2)

## 2020-12-29 LAB — GLUCOSE, CAPILLARY
Glucose-Capillary: 200 mg/dL — ABNORMAL HIGH (ref 70–99)
Glucose-Capillary: 202 mg/dL — ABNORMAL HIGH (ref 70–99)
Glucose-Capillary: 191 mg/dL — ABNORMAL HIGH (ref 70–99)
Glucose-Capillary: 278 mg/dL — ABNORMAL HIGH (ref 70–99)
Glucose-Capillary: 316 mg/dL — ABNORMAL HIGH (ref 70–99)

## 2020-12-29 LAB — POCT ACTIVATED CLOTTING TIME
Activated Clotting Time: 244 seconds
Activated Clotting Time: 231 seconds
Activated Clotting Time: 148 seconds

## 2020-12-29 LAB — FIBRINOGEN: Fibrinogen: 428 mg/dL (ref 210–475)

## 2020-12-29 LAB — HEPARIN LEVEL (UNFRACTIONATED): Heparin Unfractionated: 0.19 IU/mL — ABNORMAL LOW (ref 0.30–0.70)

## 2020-12-29 SURGERY — LOWER EXTREMITY ANGIOGRAPHY
Anesthesia: LOCAL

## 2020-12-29 MED ORDER — OXYCODONE HCL 5 MG PO TABS
5.0000 mg | ORAL_TABLET | ORAL | Status: DC | PRN
  Administered 2020-12-29 – 2021-01-06 (×26): 10 mg via ORAL
  Filled 2020-12-29 (×13): qty 2

## 2020-12-29 MED ORDER — NITROGLYCERIN 1 MG/10 ML FOR IR/CATH LAB
INTRA_ARTERIAL | Status: AC
  Filled 2020-12-29: qty 10

## 2020-12-29 MED ORDER — ACETAMINOPHEN 325 MG PO TABS
650.0000 mg | ORAL_TABLET | ORAL | Status: DC | PRN
  Administered 2020-12-31 – 2021-01-03 (×4): 650 mg via ORAL
  Filled 2020-12-29: qty 2

## 2020-12-29 MED ORDER — SODIUM CHLORIDE 0.9% FLUSH
3.0000 mL | Freq: Two times a day (BID) | INTRAVENOUS | Status: DC
  Administered 2020-12-29 – 2021-01-06 (×12): 3 mL via INTRAVENOUS

## 2020-12-29 MED ORDER — ASPIRIN EC 81 MG PO TBEC
81.0000 mg | DELAYED_RELEASE_TABLET | Freq: Every day | ORAL | Status: DC
  Administered 2020-12-30 – 2021-01-06 (×8): 81 mg via ORAL
  Filled 2020-12-29 (×8): qty 1

## 2020-12-29 MED ORDER — SODIUM CHLORIDE 0.9% FLUSH: 10.0000 mL | INTRAVENOUS | Status: DC | PRN

## 2020-12-29 MED ORDER — IODIXANOL 320 MG/ML IV SOLN
INTRAVENOUS | Status: DC | PRN
  Administered 2020-12-29: 85 mL via INTRA_ARTERIAL

## 2020-12-29 MED ORDER — SODIUM CHLORIDE 0.9 % IV SOLN
INTRAVENOUS | Status: DC
  Administered 2020-12-29: 100 mL/h via INTRAVENOUS

## 2020-12-29 MED ORDER — HEPARIN SODIUM (PORCINE) 1000 UNIT/ML IJ SOLN
INTRAMUSCULAR | Status: AC
  Filled 2020-12-29: qty 1

## 2020-12-29 MED ORDER — SODIUM CHLORIDE 0.9% FLUSH: 3.0000 mL | INTRAVENOUS | Status: DC | PRN

## 2020-12-29 MED ORDER — LIDOCAINE HCL (PF) 1 % IJ SOLN
INTRAMUSCULAR | Status: AC
  Filled 2020-12-29: qty 30

## 2020-12-29 MED ORDER — NITROGLYCERIN 1 MG/10 ML FOR IR/CATH LAB
INTRA_ARTERIAL | Status: DC | PRN
  Administered 2020-12-29: 100 ug via INTRA_ARTERIAL

## 2020-12-29 MED ORDER — SODIUM CHLORIDE 0.9% FLUSH: 10.0000 mL | Freq: Two times a day (BID) | INTRAVENOUS | Status: DC

## 2020-12-29 MED ORDER — LABETALOL HCL 5 MG/ML IV SOLN
10.0000 mg | INTRAVENOUS | Status: DC | PRN
  Administered 2021-01-03: 10 mg via INTRAVENOUS
  Filled 2020-12-29: qty 4

## 2020-12-29 MED ORDER — LIDOCAINE HCL (PF) 1 % IJ SOLN
INTRAMUSCULAR | Status: DC | PRN
  Administered 2020-12-29: 20 mL

## 2020-12-29 MED ORDER — INFLUENZA VAC SPLIT QUAD 0.5 ML IM SUSY: 0.5000 mL | PREFILLED_SYRINGE | INTRAMUSCULAR | Status: DC

## 2020-12-29 MED ORDER — HEPARIN SODIUM (PORCINE) 1000 UNIT/ML IJ SOLN
INTRAMUSCULAR | Status: DC | PRN
  Administered 2020-12-29: 10000 [IU] via INTRAVENOUS

## 2020-12-29 MED ORDER — HYDROXYZINE HCL 25 MG PO TABS
50.0000 mg | ORAL_TABLET | Freq: Three times a day (TID) | ORAL | Status: DC | PRN
  Administered 2020-12-30 – 2021-01-05 (×17): 50 mg via ORAL
  Filled 2020-12-29 (×18): qty 2

## 2020-12-29 MED ORDER — HYDRALAZINE HCL 20 MG/ML IJ SOLN: 5.0000 mg | INTRAMUSCULAR | Status: DC | PRN

## 2020-12-29 MED ORDER — MIDAZOLAM HCL 2 MG/2ML IJ SOLN
INTRAMUSCULAR | Status: DC | PRN
  Administered 2020-12-29 (×2): 1 mg via INTRAVENOUS

## 2020-12-29 MED ORDER — HEPARIN (PORCINE) IN NACL 1000-0.9 UT/500ML-% IV SOLN
INTRAVENOUS | Status: DC | PRN
  Administered 2020-12-29 (×3): 500 mL

## 2020-12-29 MED ORDER — HEPARIN (PORCINE) 25000 UT/250ML-% IV SOLN
2600.0000 [IU]/h | INTRAVENOUS | Status: DC
  Administered 2020-12-29: 1250 [IU]/h via INTRAVENOUS
  Administered 2020-12-30: 1700 [IU]/h via INTRAVENOUS
  Administered 2020-12-31: 2200 [IU]/h via INTRAVENOUS
  Administered 2020-12-31: 1700 [IU]/h via INTRAVENOUS
  Administered 2021-01-01 (×2): 2600 [IU]/h via INTRAVENOUS
  Filled 2020-12-29 (×4): qty 250

## 2020-12-29 MED ORDER — FENTANYL CITRATE (PF) 100 MCG/2ML IJ SOLN
INTRAMUSCULAR | Status: AC
  Filled 2020-12-29: qty 2

## 2020-12-29 MED ORDER — CHLORHEXIDINE GLUCONATE CLOTH 2 % EX PADS
6.0000 | MEDICATED_PAD | Freq: Every day | CUTANEOUS | Status: DC
  Administered 2020-12-30 – 2021-01-05 (×4): 6 via TOPICAL

## 2020-12-29 MED ORDER — MORPHINE SULFATE (PF) 2 MG/ML IV SOLN
2.0000 mg | INTRAVENOUS | Status: DC | PRN
  Administered 2020-12-31: 2 mg via INTRAVENOUS
  Filled 2020-12-29: qty 1

## 2020-12-29 MED ORDER — FENTANYL CITRATE (PF) 100 MCG/2ML IJ SOLN
INTRAMUSCULAR | Status: DC | PRN
  Administered 2020-12-29: 50 ug via INTRAVENOUS
  Administered 2020-12-29 (×2): 25 ug via INTRAVENOUS

## 2020-12-29 MED ORDER — MIDAZOLAM HCL 2 MG/2ML IJ SOLN
INTRAMUSCULAR | Status: AC
  Filled 2020-12-29: qty 2

## 2020-12-29 MED ORDER — SODIUM CHLORIDE 0.9 % IV SOLN: INTRAVENOUS | Status: AC

## 2020-12-29 MED ORDER — SODIUM CHLORIDE 0.9 % IV SOLN: 250.0000 mL | INTRAVENOUS | Status: DC | PRN

## 2020-12-29 MED ORDER — HEPARIN (PORCINE) IN NACL 1000-0.9 UT/500ML-% IV SOLN
INTRAVENOUS | Status: AC
  Filled 2020-12-29: qty 1000

## 2020-12-29 SURGICAL SUPPLY — 24 items
CANISTER PENUMBRA ENGINE (MISCELLANEOUS) ×3 IMPLANT
CATH ANGIO 5F BER2 100CM (CATHETERS) ×3 IMPLANT
CATH ANGIO 5F BER2 65CM (CATHETERS) ×3 IMPLANT
CATH CROSS OVER TEMPO 5F (CATHETERS) ×3 IMPLANT
CATH INDIGO CAT3 KIT (CATHETERS) ×3 IMPLANT
CATH INDIGO CAT6 KIT (CATHETERS) ×3 IMPLANT
CATH QUICKCROSS ANG SELECT (CATHETERS) ×3 IMPLANT
CLOSURE MYNX CONTROL 6F/7F (Vascular Products) ×3 IMPLANT
COVER DOME SNAP 22 D (MISCELLANEOUS) ×3 IMPLANT
GLIDEWIRE ANGLED SS 035X260CM (WIRE) ×3 IMPLANT
KIT MICROPUNCTURE NIT STIFF (SHEATH) ×3 IMPLANT
KIT PV (KITS) ×3 IMPLANT
MAT PREVALON FULL STRYKER (MISCELLANEOUS) ×3 IMPLANT
PROTECTION STATION PRESSURIZED (MISCELLANEOUS) ×3
SHEATH PINNACLE 5F 10CM (SHEATH) ×3 IMPLANT
SHEATH PINNACLE 6F 10CM (SHEATH) ×3 IMPLANT
SHEATH PINNACLE ST 6F 65CM (SHEATH) ×3 IMPLANT
SHEATH PROBE COVER 6X72 (BAG) ×3 IMPLANT
STATION PROTECTION PRESSURIZED (MISCELLANEOUS) ×2 IMPLANT
TRANSDUCER W/STOPCOCK (MISCELLANEOUS) ×3 IMPLANT
TRAY PV CATH (CUSTOM PROCEDURE TRAY) ×3 IMPLANT
WIRE G V18X300CM (WIRE) ×3 IMPLANT
WIRE HI TORQ VERSACORE J 260CM (WIRE) ×3 IMPLANT
WIRE ZILIENT 014 4G (WIRE) ×3 IMPLANT

## 2020-12-29 NOTE — Progress Notes (Signed)
[place] for Heparin Indication: DVT and inschemic limb  Allergies  Allergen Reactions  . Ciprofloxacin Hcl Hives  . Dilaudid [Hydromorphone Hcl] Shortness Of Breath and Other (See Comments)    "Asystole," per pt report  . Macrobid [Nitrofurantoin Macrocrystal] Hives, Shortness Of Breath and Rash  . Other Hives, Shortness Of Breath and Rash    NO "-CILLINS"!!!  . Penicillins Shortness Of Breath    Had had cephalosporins without incident Has patient had a PCN reaction causing immediate rash, facial/tongue/throat swelling, SOB or lightheadedness with hypotension: Yes Has patient had a PCN reaction causing severe rash involving mucus membranes or skin necrosis: Unk Has patient had a PCN reaction that required hospitalization: Unk Has patient had a PCN reaction occurring within the last 10 years: No If all of the above answers are "NO", then may proceed with Cephalosporin use.   . Sulfa Antibiotics Hives, Shortness Of Breath and Rash  . Lexapro [Escitalopram Oxalate] Other (See Comments)    "I just did not like it."    Patient Measurements: Height: '5\' 7"'$  (170.2 cm) Weight: 120.6 kg (265 lb 14 oz) IBW/kg (Calculated) : 61.6 Heparin Dosing Weight: 86.8 kg  Vital Signs: Temp: 98 F (36.7 C) (03/28 0400) Temp Source: Oral (03/28 0400) BP: 139/81 (03/28 0500) Pulse Rate: 101 (03/28 0500)  Labs: Recent Labs    12/27/20 1006 12/27/20 1113 12/27/20 1243 12/27/20 1453 12/28/20 0021 12/28/20 1026 12/28/20 1652 12/28/20 2136 12/29/20 0421  HGB  --    < >  --   --  12.4   < > 12.6 12.6 12.5  HCT  --    < >  --   --  39.4   < > 40.5 39.7 39.4  PLT  --    < >  --   --  376   < > 315 314 304  LABPROT 11.9  --  12.5  --   --   --   --   --   --   INR 0.9  --  1.0  --   --   --   --   --   --   HEPARINUNFRC  --   --   --    < > 0.10*   < > 0.34 0.28* 0.19*  CREATININE 1.85*  --   --   --  2.00*  --   --   --   --    < > = values in this  interval not displayed.    Estimated Creatinine Clearance: 47.8 mL/min (A) (by C-G formula based on SCr of 2 mg/dL (H)).  Assessment: 45 y.o. female with LLE ischemia for heparin. She is s/p OR on lysis with alteplase. Heparin 500 units/hr is going through the sheath and she is on 1050 units/hr of systemic heparin -heparin level at goal, hg= 12.6, fibrinogen 275  3/28 AM update:  Heparin level just below goal No issues per RN Alteplase infusing    Goal of Therapy:  Heparin level 0.2-0.5 units/mL, while alteplase is infusing  Monitor platelets by anticoagulation protocol: Yes   Plan:  -Inc systemic heparin to 1100 units/hr -No titration of heparin through the sheath -Q6h heparin level, fibrinogen and CBC per protocol  Narda Bonds, PharmD, BCPS Clinical Pharmacist Phone: [phone]

## 2020-12-29 NOTE — Progress Notes (Signed)
  Progress Note    12/29/2020 9:44 AM 1 Day Post-Op  Subjective: Left foot feeling better  Vitals:   12/29/20 0645 12/29/20 0700  BP: 138/85 (!) 149/80  Pulse: 95 (!) 101  Resp: 17 15  Temp:    SpO2: 96% 94%    Physical Exam: Awake alert oriented On the respirations Left arm without hematoma Left foot is warm  CBC    Component Value Date/Time   WBC 15.0 (H) 12/29/2020 0421   RBC 4.72 12/29/2020 0421   HGB 12.5 12/29/2020 0421   HGB 14.6 01/21/2020 1603   HCT 39.4 12/29/2020 0421   HCT 44.1 01/21/2020 1603   PLT 304 12/29/2020 0421   PLT 490 (H) 01/27/2018 1157   MCV 83.5 12/29/2020 0421   MCV 89 01/21/2020 1603   MCH 26.5 12/29/2020 0421   MCHC 31.7 12/29/2020 0421   RDW 16.2 (H) 12/29/2020 0421   RDW 13.5 01/21/2020 1603   LYMPHSABS 3.0 12/27/2020 1113   LYMPHSABS 3.1 01/21/2020 1603   MONOABS 0.8 12/27/2020 1113   EOSABS 0.4 12/27/2020 1113   EOSABS 0.4 01/21/2020 1603   BASOSABS 0.1 12/27/2020 1113   BASOSABS 0.1 01/21/2020 1603    BMET    Component Value Date/Time   NA 134 (L) 12/29/2020 0643   NA 135 07/29/2020 1528   K 4.6 12/29/2020 0643   CL 105 12/29/2020 0643   CO2 22 12/29/2020 0643   GLUCOSE 207 (H) 12/29/2020 0643   BUN 10 12/29/2020 0643   BUN 19 07/29/2020 1528   CREATININE 1.56 (H) 12/29/2020 0643   CREATININE 1.88 (H) 02/19/2020 1555   CALCIUM 8.6 (L) 12/29/2020 0643   GFRNONAA 42 (L) 12/29/2020 0643   GFRNONAA 32 (L) 02/19/2020 1555   GFRAA 30 (L) 07/29/2020 1528   GFRAA 37 (L) 02/19/2020 1555    INR    Component Value Date/Time   INR 1.0 12/27/2020 1243     Intake/Output Summary (Last 24 hours) at 12/29/2020 0944 Last data filed at 12/29/2020 0700 Gross per 24 hour  Intake 1577.41 ml  Output 2035 ml  Net -457.59 ml     Assessment:  45 y.o. female is s/p lytic catheter placement for occluded left common external iliac artery stents Plan: PV lab today for lytic recheck with possible lower extremity  intervention   Cherryl Babin C. Donzetta Matters, MD [facility] [place] Office: [phone] Pager: [phone]  12/29/2020 9:44 AM

## 2020-12-29 NOTE — Progress Notes (Signed)
[place] for Heparin Indication: DVT and inschemic limb  Allergies  Allergen Reactions  . Ciprofloxacin Hcl Hives  . Dilaudid [Hydromorphone Hcl] Shortness Of Breath and Other (See Comments)    "Asystole," per pt report  . Macrobid [Nitrofurantoin Macrocrystal] Hives, Shortness Of Breath and Rash  . Other Hives, Shortness Of Breath and Rash    NO "-CILLINS"!!!  . Penicillins Shortness Of Breath    Had had cephalosporins without incident Has patient had a PCN reaction causing immediate rash, facial/tongue/throat swelling, SOB or lightheadedness with hypotension: Yes Has patient had a PCN reaction causing severe rash involving mucus membranes or skin necrosis: Unk Has patient had a PCN reaction that required hospitalization: Unk Has patient had a PCN reaction occurring within the last 10 years: No If all of the above answers are "NO", then may proceed with Cephalosporin use.   . Sulfa Antibiotics Hives, Shortness Of Breath and Rash  . Lexapro [Escitalopram Oxalate] Other (See Comments)    "I just did not like it."    Patient Measurements: Height: '5\' 7"'$  (170.2 cm) Weight: 120.6 kg (265 lb 14 oz) IBW/kg (Calculated) : 61.6 Heparin Dosing Weight: 86.8 kg  Vital Signs: Temp: 97.5 F (36.4 C) (03/28 1551) Temp Source: Oral (03/28 1551) BP: 136/80 (03/28 1700) Pulse Rate: 109 (03/28 1712)  Labs: Recent Labs    12/27/20 1006 12/27/20 1113 12/27/20 1243 12/27/20 1453 12/28/20 0021 12/28/20 1026 12/28/20 1652 12/28/20 2136 12/29/20 0421 12/29/20 0643  HGB  --    < >  --   --  12.4   < > 12.6 12.6 12.5  --   HCT  --    < >  --   --  39.4   < > 40.5 39.7 39.4  --   PLT  --    < >  --   --  376   < > 315 314 304  --   LABPROT 11.9  --  12.5  --   --   --   --   --   --   --   INR 0.9  --  1.0  --   --   --   --   --   --   --   HEPARINUNFRC  --   --   --    < > 0.10*   < > 0.34 0.28* 0.19*  --   CREATININE 1.85*  --   --   --  2.00*   --   --   --   --  1.56*   < > = values in this interval not displayed.    Estimated Creatinine Clearance: 61.3 mL/min (A) (by C-G formula based on SCr of 1.56 mg/dL (H)).  Assessment: 45 y.o. female with LLE ischemia for heparin. She is s/p OR on lysis with alteplase.   Sheath heparin and alteplase as well as IV heparin stopped at 12:35PM. Good flow to foot. ACT down to 143. Cath lab removed sheath and finished applying pressure at 1845 PM.  Consulted to start IV Heparin 2 hours after this time.    Goal of Therapy:  Heparin level 0.2-0.5 units/mL, while alteplase is infusing  Monitor platelets by anticoagulation protocol: Yes   Plan:  -Start IV Heparin (no bolus) at 1250 units/hr at 2100 PM (2hrs after sheath pull) -Heparin level in 6 hours.  -Daily Heparin level and CBC while on therapy  Sloan Leiter, PharmD, BCPS, BCCCP Clinical Pharmacist Please refer  to [facility] for [place] numbers 12/29/2020, 6:18 PM

## 2020-12-29 NOTE — Progress Notes (Signed)
23f sheath present in left brachial artery. Slight ecchymosis present distal to sheath insertion site. Sheath aspirated and removed from left brachial artery, manual pressure applied for 30 minutes. Site level 0, no additional ecchymosis, no hematoma. Tegaderm dressing applied, bedrest instructions given. Left radial pulse palpable. Spo2 as measured in left index finger is 98%.  Armboard support placed under elbow to limit bending.  Bedrest begins at 18:50:00

## 2020-12-29 NOTE — Progress Notes (Signed)
   I evaluated patient at bedside this evening.  She has sensation and motor function of the left foot and it does not appear ischemic it is actually warm to the touch.  There is her usual signal in the lateral aspect of the foot and this was marked.  She is waiting to have her left brachial sheath removed and we will restart heparin 2 hours after.  She is okay for general diet at this time I have asked her to be n.p.o. past midnight so we can recheck her in the morning.  We will also keep her central line in place for tonight and keep her in the ICU.  She is asking for help with drug addiction and we will discuss with social work prior to discharge.  Josefita Weissmann C. Donzetta Matters, MD [facility] [place] Office: [phone] Pager: [phone]

## 2020-12-29 NOTE — Op Note (Signed)
Patient name: Terri Sparks MRN: [id number] DOB: Dec 11, 1975 Sex: female  12/29/2020 Pre-operative Diagnosis: Acute left lower extremity ischemia Post-operative diagnosis:  Same Surgeon:  Erlene Quan C. Donzetta Matters, MD Procedure Performed: 1.  Lysis recheck with aortogram with limited left lower extremity angiogram from brachial sheath 2.  Ultrasound-guided cannulation right common femoral artery 3.  Left lower extremity angiography 4.  Mechanical thrombectomy with penumbra cat 6 of popliteal, tibioperoneal trunk, peroneal and anterior tibial arteries and cat 3 mechanical thrombectomy of anterior tibial and peroneal arteries 5.  Moderate sedation with fentanyl Versed 407 minutes 6.  Minx device closure right common femoral artery  Indications: 45 year old female with previous left common iliac artery stent which subsequently thrombosed.  She had [place] 1 ischemia and was indicated for lysis catheter placement which was placed the day prior to this procedure.  She subsequently indicated for return to the Cath Lab for lysis recheck and possible left lower extremity intervention.  Findings: The stent in the left common neck artery was patent with approximately 30% stenosis which was not intervened upon.  There was acute appearing clot at the proximal above-knee popliteal artery and below the knee at the tibioperoneal trunk takeoff of the anterior tibial artery.  All clot was removed from the popliteal artery as well as the takeoff of the tibioperoneal trunk and anterior tibial artery.  Unfortunately there appears to be outflow thrombosis of the anterior tibial artery and peroneal arteries.  There is no reconstitution of the posterior tibial artery distally.  We did perform mechanical thrombectomy of the anterior tibial and peroneal arteries I did have better flow in the peroneal artery to the level of the distal ankle I also injected nitroglycerin total of 100 mcg.  I could never reestablished distal flow  into the foot which does appear to be somewhat of a chronic process and possibly consistent with her previous angiography performed in the OR in the past.  The clot that was removed from above the knee and the tibioperoneal trunk did appear to be acute thrombosis everything that was removed from the tibial arteries themselves appeared to be chronic in nature. At completion I cannot identify any signals distally in the foot however patient did have sensation and motor intact.    Procedure:  The patient was identified in the holding area and taken to room 8.  The patient was then placed supine on the table and prepped and draped in the usual sterile fashion.  A time out was called.  The previous catheter was accessed and a wire was placed through it.  I then performed angiography through the sheath.  This demonstrated the previous stent to be patent.  I placed a Berenstein catheter down through the stent down to the left common femoral artery and perform limited left lower extremity angiography which demonstrated thrombosis of the popliteal and tibioperoneal trunk.  Unfortunately I did not have a penumbra catheter that could reach this from the brachial artery.  At this time I then prepped and draped the right lower extremity in the groin.  The common femoral artery was identified with ultrasound was cannulated micropuncture needle followed by wire and sheath.  We placed a Bentson wire followed by 5 [place] sheath.  We then placed a crossover catheter and were able to use a versa core wire to cross the bifurcation we then exchanged for a long 6 French sheath and the patient was fully heparinized.  We then used a Glidewire we selected the SFA were  able to get down to the level to popliteal artery.  We stopped prior to the thrombus.  We then exchanged for an 014 wire placed this through the area of concern down to the peroneal artery.  We used a 6 thrombectomy device we did thrombectomize significant thrombus from  the popliteal as well as the tibioperoneal trunk angiography demonstrated distal clot was gone and there was good clot in the penumbra device itself.  We then used a wire to direct the catheter down to the peroneal artery and anterior tibial artery successively.  We performed penumbra thrombectomy with a cat 6 device there.  Unfortunately completion demonstrated no further flow past the mid calf.  I then exchanged for a 3 device attempted to get better flow but could not.  I reexchanged for a cat 6 device.  Ultimately did get some clot as well as what appeared to be chronic appearing fibrosis of the vessels.  I had better flow into the peroneal artery with a wire down.  I administered 100 mcg of nitroglycerin.  Some of this certainly appears to be chronic I do not have good flow into the foot.  I elected patient will either need cutdown procedure to embolectomized her tibials or if she does not have any worsening symptoms she may be able to just be anticoagulated.  We removed our wire and catheter exchange for a 035 wire and placed a short 6 French sheath in the groin.  A minx device was deployed which she tolerated well.  The brachial sheath will be pulled in the ICU.  I checked for signals in the left foot could find none patient was sensorimotor intact.  She did tolerate procedure well without immediate complication.   Contrast: 85cc  Makalya Nave C. Donzetta Matters, MD [facility] [place] Office: [phone] Pager: [phone]

## 2020-12-30 ENCOUNTER — Encounter (HOSPITAL_COMMUNITY): Payer: Self-pay | Admitting: Vascular Surgery

## 2020-12-30 LAB — BASIC METABOLIC PANEL
Anion gap: 6 (ref 5–15)
BUN: 10 mg/dL (ref 6–20)
CO2: 23 mmol/L (ref 22–32)
Calcium: 8.8 mg/dL — ABNORMAL LOW (ref 8.9–10.3)
Chloride: 104 mmol/L (ref 98–111)
Creatinine, Ser: 1.44 mg/dL — ABNORMAL HIGH (ref 0.44–1.00)
GFR, Estimated: 46 mL/min — ABNORMAL LOW (ref >60)
Glucose, Bld: 195 mg/dL — ABNORMAL HIGH (ref 70–99)
Potassium: 4.3 mmol/L (ref 3.5–5.1)
Sodium: 133 mmol/L — ABNORMAL LOW (ref 135–145)

## 2020-12-30 LAB — GLUCOSE, CAPILLARY
Glucose-Capillary: 191 mg/dL — ABNORMAL HIGH (ref 70–99)
Glucose-Capillary: 311 mg/dL — ABNORMAL HIGH (ref 70–99)
Glucose-Capillary: 198 mg/dL — ABNORMAL HIGH (ref 70–99)
Glucose-Capillary: 235 mg/dL — ABNORMAL HIGH (ref 70–99)
Glucose-Capillary: 272 mg/dL — ABNORMAL HIGH (ref 70–99)

## 2020-12-30 LAB — CBC
HCT: 33.1 % — ABNORMAL LOW (ref 36.0–46.0)
Hemoglobin: 10.6 g/dL — ABNORMAL LOW (ref 12.0–15.0)
MCH: 27 pg (ref 26.0–34.0)
MCHC: 32 g/dL (ref 30.0–36.0)
MCV: 84.4 fL (ref 80.0–100.0)
Platelets: 251 10*3/uL (ref 150–400)
RBC: 3.92 MIL/uL (ref 3.87–5.11)
RDW: 16.3 % — ABNORMAL HIGH (ref 11.5–15.5)
WBC: 12.3 10*3/uL — ABNORMAL HIGH (ref 4.0–10.5)
nRBC: 0 % (ref 0.0–0.2)

## 2020-12-30 LAB — HEPARIN LEVEL (UNFRACTIONATED)
Heparin Unfractionated: <0.1 IU/mL — ABNORMAL LOW (ref 0.30–0.70)
Heparin Unfractionated: <0.1 IU/mL — ABNORMAL LOW (ref 0.30–0.70)

## 2020-12-30 MED ORDER — WARFARIN - PHARMACIST DOSING INPATIENT: Freq: Every day | Status: DC

## 2020-12-30 MED ORDER — HEPARIN BOLUS VIA INFUSION
2500.0000 [IU] | Freq: Once | INTRAVENOUS | Status: AC
  Administered 2020-12-30: 2500 [IU] via INTRAVENOUS
  Filled 2020-12-30: qty 2500

## 2020-12-30 MED ORDER — WARFARIN SODIUM 5 MG PO TABS
5.0000 mg | ORAL_TABLET | Freq: Once | ORAL | Status: AC
  Administered 2020-12-30: 5 mg via ORAL
  Filled 2020-12-30: qty 1

## 2020-12-30 NOTE — Progress Notes (Signed)
[place] for Heparin Indication: DVT and inschemic limb  Allergies  Allergen Reactions  . Ciprofloxacin Hcl Hives  . Dilaudid [Hydromorphone Hcl] Shortness Of Breath and Other (See Comments)    "Asystole," per pt report  . Macrobid [Nitrofurantoin Macrocrystal] Hives, Shortness Of Breath and Rash  . Other Hives, Shortness Of Breath and Rash    NO "-CILLINS"!!!  . Penicillins Shortness Of Breath    Had had cephalosporins without incident Has patient had a PCN reaction causing immediate rash, facial/tongue/throat swelling, SOB or lightheadedness with hypotension: Yes Has patient had a PCN reaction causing severe rash involving mucus membranes or skin necrosis: Unk Has patient had a PCN reaction that required hospitalization: Unk Has patient had a PCN reaction occurring within the last 10 years: No If all of the above answers are "NO", then may proceed with Cephalosporin use.   . Sulfa Antibiotics Hives, Shortness Of Breath and Rash  . Lexapro [Escitalopram Oxalate] Other (See Comments)    "I just did not like it."    Patient Measurements: Height: '5\' 7"'$  (170.2 cm) Weight: 120.6 kg (265 lb 14 oz) IBW/kg (Calculated) : 61.6 Heparin Dosing Weight: 86.8 kg  Vital Signs: Temp: 98.6 F (37 C) (03/29 0358) Temp Source: Oral (03/29 0358) BP: 143/75 (03/29 0400) Pulse Rate: 95 (03/29 0400)  Labs: Recent Labs    12/27/20 1006 12/27/20 1113 12/27/20 1243 12/27/20 1453 12/28/20 0021 12/28/20 1026 12/28/20 2136 12/29/20 0421 12/29/20 0643 12/30/20 0430  HGB  --    < >  --   --  12.4   < > 12.6 12.5  --  10.6*  HCT  --    < >  --   --  39.4   < > 39.7 39.4  --  33.1*  PLT  --    < >  --   --  376   < > 314 304  --  251  LABPROT 11.9  --  12.5  --   --   --   --   --   --   --   INR 0.9  --  1.0  --   --   --   --   --   --   --   HEPARINUNFRC  --   --   --    < > 0.10*   < > 0.28* 0.19*  --  <0.10*  CREATININE 1.85*  --   --   --  2.00*   --   --   --  1.56* 1.44*   < > = values in this interval not displayed.    Estimated Creatinine Clearance: 66.4 mL/min (A) (by C-G formula based on SCr of 1.44 mg/dL (H)).  Assessment: 45 y.o. female with LLE ischemia for heparin. She is s/p OR on lysis with alteplase.   Sheath heparin and alteplase as well as IV heparin stopped at 12:35PM. Good flow to foot. ACT down to 143. Cath lab removed sheath and finished applying pressure at 1845 PM.  Consulted to start IV Heparin 2 hours after this time.   3/29 AM update:  Heparin level undetectable  No issues per RN   Goal of Therapy:  Heparin level 0.2-0.5 units/mL 24 hours s/p alteplase Monitor platelets by anticoagulation protocol: Yes   Plan:  -Inc heparin to 1500 units/hr -1300 HL -Can increase heparin level back to full goal at next check  Narda Bonds, PharmD, [place] Pharmacist Phone: [phone]

## 2020-12-30 NOTE — Plan of Care (Signed)
  Problem: Clinical Measurements: Goal: Respiratory complications will improve Outcome: Progressing   Problem: Clinical Measurements: Goal: Cardiovascular complication will be avoided Outcome: Progressing   Problem: Coping: Goal: Level of anxiety will decrease Outcome: Not Progressing   Problem: Elimination: Goal: Will not experience complications related to bowel motility Outcome: Progressing   Problem: Pain Managment: Goal: General experience of comfort will improve Outcome: Not Progressing

## 2020-12-30 NOTE — Progress Notes (Addendum)
Progress Note    12/30/2020 7:48 AM 1 Day Post-Op  Subjective:  She denies pain in L foot.  It feels warm and is motor and sensation intact   Vitals:   12/30/20 0500 12/30/20 0600  BP:  135/77  Pulse: 94 (!) 101  Resp: 15 16  Temp:    SpO2: 96% 95%   Physical Exam: Lungs:  Non labored Incisions:  R groin soft Extremities:  L calf soft without tenderness; L peroneal, DP, and PT by doppler; L brachial stick with hematoma; no sign of compartment; L ulnar and palmar arch by doppler Neurologic: A&O  CBC    Component Value Date/Time   WBC 12.3 (H) 12/30/2020 0430   RBC 3.92 12/30/2020 0430   HGB 10.6 (L) 12/30/2020 0430   HGB 14.6 01/21/2020 1603   HCT 33.1 (L) 12/30/2020 0430   HCT 44.1 01/21/2020 1603   PLT 251 12/30/2020 0430   PLT 490 (H) 01/27/2018 1157   MCV 84.4 12/30/2020 0430   MCV 89 01/21/2020 1603   MCH 27.0 12/30/2020 0430   MCHC 32.0 12/30/2020 0430   RDW 16.3 (H) 12/30/2020 0430   RDW 13.5 01/21/2020 1603   LYMPHSABS 3.0 12/27/2020 1113   LYMPHSABS 3.1 01/21/2020 1603   MONOABS 0.8 12/27/2020 1113   EOSABS 0.4 12/27/2020 1113   EOSABS 0.4 01/21/2020 1603   BASOSABS 0.1 12/27/2020 1113   BASOSABS 0.1 01/21/2020 1603    BMET    Component Value Date/Time   NA 133 (L) 12/30/2020 0430   NA 135 07/29/2020 1528   K 4.3 12/30/2020 0430   CL 104 12/30/2020 0430   CO2 23 12/30/2020 0430   GLUCOSE 195 (H) 12/30/2020 0430   BUN 10 12/30/2020 0430   BUN 19 07/29/2020 1528   CREATININE 1.44 (H) 12/30/2020 0430   CREATININE 1.88 (H) 02/19/2020 1555   CALCIUM 8.8 (L) 12/30/2020 0430   GFRNONAA 46 (L) 12/30/2020 0430   GFRNONAA 32 (L) 02/19/2020 1555   GFRAA 30 (L) 07/29/2020 1528   GFRAA 37 (L) 02/19/2020 1555    INR    Component Value Date/Time   INR 1.0 12/27/2020 1243     Intake/Output Summary (Last 24 hours) at 12/30/2020 0748 Last data filed at 12/30/2020 0600 Gross per 24 hour  Intake 3097.48 ml  Output 4625 ml  Net -1527.52 ml      Assessment/Plan:  45 y.o. female is s/p mechanical thrombectomy LLE 1 Day Post-Op   L foot well perfused with peroneal, DP, and PT signal by doppler L brachial cath site with hematoma; no compartment; L ulnar and palmar arch by doppler; continue to monitor Pharmacy consulted to transition to coumadin D/c IJ central line Ok to have a diet this morning TOC consulted for drug addiction resources Transfer to 4e   Dagoberto Ligas, PA-C [facility] [phone] 12/30/2020 7:48 AM   I have independently interviewed and examined patient and agree with PA assessment and plan above.  I have again discussed with her the importance of Coumadin therapy moving forward as well as her high likelihood of amputation should she have recurrent acute limb ischemia of the left lower extremity.  Thankfully at this time the foot is warm and well-perfused sensorimotor intact with her expected signal distally on the lateral foot.  Left hand is also well perfused after brachial sheath pull.  We will initiate Coumadin therapy today she is okay for diet and transfer to the floor today.  Refoel Palladino C. Donzetta Matters, MD [facility] [place] Office: [phone] Pager: [phone]

## 2020-12-30 NOTE — Progress Notes (Signed)
Inpatient Diabetes Program Recommendations  AACE/ADA: New Consensus Statement on Inpatient Glycemic Control   Target Ranges:  Prepandial:   less than 140 mg/dL      Peak postprandial:   less than 180 mg/dL (1-2 hours)      Critically ill patients:  140 - 180 mg/dL  Results for Terri, Sparks (MRN [id number]) as of 12/30/2020 09:47  Ref. Range 12/29/2020 08:53 12/29/2020 11:16 12/29/2020 15:48 12/29/2020 23:13 12/30/2020 06:19  Glucose-Capillary Latest Ref Range: 70 - 99 mg/dL 202 (H) 191 (H) 278 (H) 316 (H) 191 (H)   Results for Terri, Sparks (MRN [id number]) as of 12/30/2020 09:47  Ref. Range 12/27/2020 10:06  Glucose Latest Ref Range: 70 - 99 mg/dL 438 (H)   Results for Terri, Sparks (MRN [id number]) as of 12/30/2020 09:47  Ref. Range 12/27/2020 14:22  Hemoglobin A1C Latest Ref Range: 4.8 - 5.6 % 13.5 (H)   Review of Glycemic Control  Diabetes history: DM2 Outpatient Diabetes medications: Lantus 65-68 units QHS, Novolog 5-15 units TID with meals Current orders for Inpatient glycemic control: Novolog 0-15 units TID with meals, Novolog 0-5 units QHS  Inpatient Diabetes Program Recommendations:    HbgA1C:  A1C 13.5% on 12/27/20 indicating an average glucose of 341 mg/dl over the past 2-3 months. At time of discharge, patient will need Rx for: Lantus  SoloStar pens [phone]) and Tobie Lords [phone]), pen needles [phone]), and glucose monitoring kit (#[id number]).   NOTE: In reviewing chart, noted patient has DM2 hx and takes Lantus and Novolog for DM control per home medication list. Patient had Medicaid insurance listed in chart. Current A1C is 13.5% on 12/27/20. Spoke with patient about diabetes and home regimen for diabetes control. Patient reports being followed by [facility] for diabetes management and currently prescribed Lantus 68 units QHS and Novolog 5-15 units TID with meals as an outpatient for diabetes control. Patient reports that she has not been taking  DM medications as prescribed, "using insulin here and there".   Patient reports that she has not been checking glucose as she needs a new glucometer and test strips.  Patient states that she knows she has not been doing what she should regarding her health but she plans to get on track and do everything she is told.  Discussed A1C results (13.5% on 12/27/20 ) and explained that current A1C indicates an average glucose of 341 mg/dl over the past 2-3 months. Discussed glucose and A1C goals. Discussed importance of checking CBGs and maintaining good CBG control to prevent long-term and short-term complications. Explained how hyperglycemia leads to damage within blood vessels which lead to the common complications seen with uncontrolled diabetes. Stressed to the patient the importance of improving glycemic control to prevent further complications from uncontrolled diabetes. Discussed impact of nutrition, exercise, stress, sickness, and medications on diabetes control.  Discussed carbohydrates, carbohydrate goals per day and meal, along with portion sizes. Encouraged patient to check glucose 4 times per day (before meals and at bedtime) and to keep a log book of glucose readings and DM medication taken which patient will need to take to doctor appointments. Explained how the doctor can use the log book to continue to make adjustments with DM medications if needed.  Patient verbalized understanding of information discussed and reports no further questions at this time related to diabetes. However, she states that she was told her kidney function was bad and she has noted bloody urine and she is very concerned about both  and would like attending provider to discuss both with her when rounding. Will make physician sticky note regarding patient's concerns.  At time of discharge, patient will need Rx for: Lantus  SoloStar pens [phone]) and Tobie Lords [phone]), pen needles [phone]), and glucose monitoring kit  (#[id number]).  Thanks, Barnie Alderman, RN, MSN, CDE Diabetes Coordinator Inpatient Diabetes Program [phone] (Team Pager from 8am to 5pm)

## 2020-12-30 NOTE — Progress Notes (Signed)
Pt transferred to 4E 13

## 2020-12-30 NOTE — Progress Notes (Signed)
Attempted to call report x1. RN reports she will call this nurse back.

## 2020-12-30 NOTE — Progress Notes (Signed)
ANTICOAGULATION CONSULT NOTE - Initial Consult  Pharmacy Consult for IV heparin + warfarin  Indication: DVT, ischemic limb   Allergies  Allergen Reactions  . Ciprofloxacin Hcl Hives  . Dilaudid [Hydromorphone Hcl] Shortness Of Breath and Other (See Comments)    "Asystole," per pt report  . Macrobid [Nitrofurantoin Macrocrystal] Hives, Shortness Of Breath and Rash  . Other Hives, Shortness Of Breath and Rash    NO "-CILLINS"!!!  . Penicillins Shortness Of Breath    Had had cephalosporins without incident Has patient had a PCN reaction causing immediate rash, facial/tongue/throat swelling, SOB or lightheadedness with hypotension: Yes Has patient had a PCN reaction causing severe rash involving mucus membranes or skin necrosis: Unk Has patient had a PCN reaction that required hospitalization: Unk Has patient had a PCN reaction occurring within the last 10 years: No If all of the above answers are "NO", then may proceed with Cephalosporin use.   . Sulfa Antibiotics Hives, Shortness Of Breath and Rash  . Lexapro [Escitalopram Oxalate] Other (See Comments)    "I just did not like it."    Patient Measurements: Height: '5\' 7"'$  (170.2 cm) Weight: 120.4 kg (265 lb 6.9 oz) IBW/kg (Calculated) : 61.6 Heparin Dosing Weight: 86.8kg  Vital Signs: Temp: 98.6 F (37 C) (03/29 0358) Temp Source: Oral (03/29 0358) BP: 135/77 (03/29 0600) Pulse Rate: 101 (03/29 0600)  Labs: Recent Labs    12/27/20 1006 12/27/20 1113 12/27/20 1243 12/27/20 1453 12/28/20 0021 12/28/20 1026 12/28/20 2136 12/29/20 0421 12/29/20 0643 12/30/20 0430  HGB  --    < >  --   --  12.4   < > 12.6 12.5  --  10.6*  HCT  --    < >  --   --  39.4   < > 39.7 39.4  --  33.1*  PLT  --    < >  --   --  376   < > 314 304  --  251  LABPROT 11.9  --  12.5  --   --   --   --   --   --   --   INR 0.9  --  1.0  --   --   --   --   --   --   --   HEPARINUNFRC  --   --   --    < > 0.10*   < > 0.28* 0.19*  --  <0.10*   CREATININE 1.85*  --   --   --  2.00*  --   --   --  1.56* 1.44*   < > = values in this interval not displayed.    Estimated Creatinine Clearance: 66.3 mL/min (A) (by C-G formula based on SCr of 1.44 mg/dL (H)).   Medical History: Past Medical History:  Diagnosis Date  . Anxiety   . Depression   . Diabetes ([place])    Type 2  . GERD (gastroesophageal reflux disease)   . History of blood clots    ,DVT-left leg, and early 2000, had blood clot behind left breast  . History of kidney stones   . Hypercalcemia   . Hyperparathyroidism   . Hypertension    had been on Lisinopril and PCP took her off it 4 months ago  . Iron deficiency anemia   . Left thyroid nodule   . Mass of right ovary   . Nephrolithiasis   . 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 Peripheral  vascular disease ([place])   . Prolonged Q-T interval on ECG 04/19/2019  . Renal calculi   . Substance abuse ([place])   . Tooth loose    top tooth from the right  is loose     Medications:  Infusions:  . sodium chloride    . sodium chloride Stopped (12/30/20 0816)  . sodium chloride    . heparin 1,250 Units/hr (12/30/20 0600)    Assessment: 44yoF presenting to the ED with complaints of ischemic limb. She has a history of recurrent DVTs requiring thrombectomies and is on warfarin but reports noncompliance PTA (for ~5d) with a subtherapeutic INR on admission. She is s/p thrombolysis / thrombectomy and heparin was resumed 2 hours after sheath removal yesterday 3/28. Pharmacy consulted to resume warfarin now.    She has improved perfusion since yesterday's procedure without s/sx bleed. Home warfarin regimen was '5mg'$  on Sun/Tues/Thurs/Sat and '10mg'$  on M/W/F. Will resume home dose and follow INRs daily.  Follow-up HL undetectable. Will give bolus and increase gtt rate.  Goal of Therapy:  INR 2-3 Heparin level 0.3-0.7 units/ml Monitor platelets by anticoagulation protocol: Yes   Plan:  Give heparin bolus  2,500units Increase IV heparin at 1,700 units/hr Follow up with 8-hr heparin level Give warfarin '5mg'$  x1 today Follow daily INR, CBC, s/sx bleeding  Mercy Riding, PharmD PGY1 Acute Care Pharmacy Resident Please refer to [facility] for unit-specific pharmacist

## 2020-12-31 LAB — HEPARIN LEVEL (UNFRACTIONATED)
Heparin Unfractionated: <0.1 IU/mL — ABNORMAL LOW (ref 0.30–0.70)
Heparin Unfractionated: 0.22 IU/mL — ABNORMAL LOW (ref 0.30–0.70)
Heparin Unfractionated: 0.28 IU/mL — ABNORMAL LOW (ref 0.30–0.70)

## 2020-12-31 LAB — GLUCOSE, CAPILLARY
Glucose-Capillary: 273 mg/dL — ABNORMAL HIGH (ref 70–99)
Glucose-Capillary: 174 mg/dL — ABNORMAL HIGH (ref 70–99)
Glucose-Capillary: 281 mg/dL — ABNORMAL HIGH (ref 70–99)
Glucose-Capillary: 217 mg/dL — ABNORMAL HIGH (ref 70–99)

## 2020-12-31 LAB — CBC
HCT: 31.8 % — ABNORMAL LOW (ref 36.0–46.0)
Hemoglobin: 10 g/dL — ABNORMAL LOW (ref 12.0–15.0)
MCH: 26.5 pg (ref 26.0–34.0)
MCHC: 31.4 g/dL (ref 30.0–36.0)
MCV: 84.1 fL (ref 80.0–100.0)
Platelets: 263 10*3/uL (ref 150–400)
RBC: 3.78 MIL/uL — ABNORMAL LOW (ref 3.87–5.11)
RDW: 16.3 % — ABNORMAL HIGH (ref 11.5–15.5)
WBC: 11.2 10*3/uL — ABNORMAL HIGH (ref 4.0–10.5)
nRBC: 0 % (ref 0.0–0.2)

## 2020-12-31 LAB — PROTIME-INR
INR: 1.1 (ref 0.8–1.2)
Prothrombin Time: 13.4 seconds (ref 11.4–15.2)

## 2020-12-31 MED ORDER — INSULIN GLARGINE 100 UNIT/ML ~~LOC~~ SOLN
15.0000 [IU] | Freq: Every day | SUBCUTANEOUS | Status: DC
  Administered 2020-12-31 – 2021-01-01 (×2): 15 [IU] via SUBCUTANEOUS
  Filled 2020-12-31 (×3): qty 0.15

## 2020-12-31 MED ORDER — MAGNESIUM HYDROXIDE 400 MG/5ML PO SUSP
30.0000 mL | Freq: Two times a day (BID) | ORAL | Status: DC | PRN
  Administered 2021-01-02 – 2021-01-03 (×2): 30 mL via ORAL
  Filled 2020-12-31 (×2): qty 30

## 2020-12-31 MED ORDER — HEPARIN BOLUS VIA INFUSION
1000.0000 [IU] | Freq: Once | INTRAVENOUS | Status: AC
  Administered 2020-12-31: 1000 [IU] via INTRAVENOUS
  Filled 2020-12-31: qty 1000

## 2020-12-31 MED ORDER — WARFARIN SODIUM 10 MG PO TABS
10.0000 mg | ORAL_TABLET | Freq: Once | ORAL | Status: AC
  Administered 2020-12-31: 10 mg via ORAL
  Filled 2020-12-31: qty 1

## 2020-12-31 NOTE — Progress Notes (Signed)
[facility] for Heparin and Coumadin Indication: DVT and inschemic limb  Allergies  Allergen Reactions  . Ciprofloxacin Hcl Hives  . Dilaudid [Hydromorphone Hcl] Shortness Of Breath and Other (See Comments)    "Asystole," per pt report  . Macrobid [Nitrofurantoin Macrocrystal] Hives, Shortness Of Breath and Rash  . Other Hives, Shortness Of Breath and Rash    NO "-CILLINS"!!!  . Penicillins Shortness Of Breath    Had had cephalosporins without incident Has patient had a PCN reaction causing immediate rash, facial/tongue/throat swelling, SOB or lightheadedness with hypotension: Yes Has patient had a PCN reaction causing severe rash involving mucus membranes or skin necrosis: Unk Has patient had a PCN reaction that required hospitalization: Unk Has patient had a PCN reaction occurring within the last 10 years: No If all of the above answers are "NO", then may proceed with Cephalosporin use.   . Sulfa Antibiotics Hives, Shortness Of Breath and Rash  . Lexapro [Escitalopram Oxalate] Other (See Comments)    "I just did not like it."    Patient Measurements: Height: '5\' 7"'$  (170.2 cm) Weight: 120.4 kg (265 lb 6.9 oz) IBW/kg (Calculated) : 61.6 Heparin Dosing Weight: 86.8 kg  Vital Signs: Temp: 98.1 F (36.7 C) (03/30 0300) Temp Source: Oral (03/30 0300) BP: 134/70 (03/30 0300) Pulse Rate: 90 (03/29 1955)  Labs: Recent Labs    12/29/20 0421 12/29/20 0643 12/30/20 0430 12/30/20 1320 12/31/20 0245  HGB 12.5  --  10.6*  --  10.0*  HCT 39.4  --  33.1*  --  31.8*  PLT 304  --  251  --  263  LABPROT  --   --   --   --  13.4  INR  --   --   --   --  1.1  HEPARINUNFRC 0.19*  --  <0.10* <0.10* <0.10*  CREATININE  --  1.56* 1.44*  --   --     Estimated Creatinine Clearance: 66.3 mL/min (A) (by C-G formula based on SCr of 1.44 mg/dL (H)).  Assessment: 45 y.o. female with LLE ischemia s/p lysis for anticoagulation  Goal of Therapy:  Heparin  level 0.3-0.7 units/ml Monitor platelets by anticoagulation protocol: Yes   Plan:  Increase Heparin  2000 units/hr Check heparin level in 8 hours.  Coumadin 10 mg today   Phillis Knack, PharmD, BCPS

## 2020-12-31 NOTE — Progress Notes (Addendum)
  Progress Note    12/31/2020 7:46 AM 2 Days Post-Op  Subjective:  L foot warm.  Feels better compared to before surgery.  Pain anterolateral ankle   Vitals:   12/31/20 0300 12/31/20 0740  BP: 134/70 (!) 157/82  Pulse:  99  Resp: 16 17  Temp: 98.1 F (36.7 C) 98.6 F (37 C)  SpO2: 100% 100%   Physical Exam: Lungs:  Non labored Incisions:  R groin soft; L brachial soft Extremities:  L DP and peroneal brisk by doppler; no calf tenderness; anterolateral ankle tenderness Neurologic: A&O  CBC    Component Value Date/Time   WBC 11.2 (H) 12/31/2020 0245   RBC 3.78 (L) 12/31/2020 0245   HGB 10.0 (L) 12/31/2020 0245   HGB 14.6 01/21/2020 1603   HCT 31.8 (L) 12/31/2020 0245   HCT 44.1 01/21/2020 1603   PLT 263 12/31/2020 0245   PLT 490 (H) 01/27/2018 1157   MCV 84.1 12/31/2020 0245   MCV 89 01/21/2020 1603   MCH 26.5 12/31/2020 0245   MCHC 31.4 12/31/2020 0245   RDW 16.3 (H) 12/31/2020 0245   RDW 13.5 01/21/2020 1603   LYMPHSABS 3.0 12/27/2020 1113   LYMPHSABS 3.1 01/21/2020 1603   MONOABS 0.8 12/27/2020 1113   EOSABS 0.4 12/27/2020 1113   EOSABS 0.4 01/21/2020 1603   BASOSABS 0.1 12/27/2020 1113   BASOSABS 0.1 01/21/2020 1603    BMET    Component Value Date/Time   NA 133 (L) 12/30/2020 0430   NA 135 07/29/2020 1528   K 4.3 12/30/2020 0430   CL 104 12/30/2020 0430   CO2 23 12/30/2020 0430   GLUCOSE 195 (H) 12/30/2020 0430   BUN 10 12/30/2020 0430   BUN 19 07/29/2020 1528   CREATININE 1.44 (H) 12/30/2020 0430   CREATININE 1.88 (H) 02/19/2020 1555   CALCIUM 8.8 (L) 12/30/2020 0430   GFRNONAA 46 (L) 12/30/2020 0430   GFRNONAA 32 (L) 02/19/2020 1555   GFRAA 30 (L) 07/29/2020 1528   GFRAA 37 (L) 02/19/2020 1555    INR    Component Value Date/Time   INR 1.1 12/31/2020 0245     Intake/Output Summary (Last 24 hours) at 12/31/2020 0746 Last data filed at 12/31/2020 0026 Gross per 24 hour  Intake 317.46 ml  Output --  Net 317.46 ml      Assessment/Plan:  45 y.o. female is s/p Mechanical thrombectomy LLE 2 Days Post-Op   L foot well perfused with brisk DP and peroneal signals by doppler L anterolateral ankle painful and tight; continue to monitor Some dusky appearing toes of L foot; allow time for demarcation Continue heparin bridge with coumadin OOB with PT today   Dagoberto Ligas, PA-C [facility] [phone] 12/31/2020 7:46 AM  I have independently interviewed and examined patient and agree with PA assessment and plan above.   Pippa Hanif C. Donzetta Matters, MD [facility] [place] Office: [phone] Pager: [phone]

## 2020-12-31 NOTE — Progress Notes (Signed)
[facility] for Heparin and Coumadin Indication: DVT and inschemic limb  Allergies  Allergen Reactions  . Ciprofloxacin Hcl Hives  . Dilaudid [Hydromorphone Hcl] Shortness Of Breath and Other (See Comments)    "Asystole," per pt report  . Macrobid [Nitrofurantoin Macrocrystal] Hives, Shortness Of Breath and Rash  . Other Hives, Shortness Of Breath and Rash    NO "-CILLINS"!!!  . Penicillins Shortness Of Breath    Had had cephalosporins without incident Has patient had a PCN reaction causing immediate rash, facial/tongue/throat swelling, SOB or lightheadedness with hypotension: Yes Has patient had a PCN reaction causing severe rash involving mucus membranes or skin necrosis: Unk Has patient had a PCN reaction that required hospitalization: Unk Has patient had a PCN reaction occurring within the last 10 years: No If all of the above answers are "NO", then may proceed with Cephalosporin use.   . Sulfa Antibiotics Hives, Shortness Of Breath and Rash  . Lexapro [Escitalopram Oxalate] Other (See Comments)    "I just did not like it."    Patient Measurements: Height: '5\' 7"'$  (170.2 cm) Weight: 120.4 kg (265 lb 6.9 oz) IBW/kg (Calculated) : 61.6 Heparin Dosing Weight: 86.8 kg  Vital Signs: Temp: 97.6 F (36.4 C) (03/30 1131) Temp Source: Oral (03/30 1131) BP: 141/83 (03/30 1131) Pulse Rate: 89 (03/30 1131)  Labs: Recent Labs    12/29/20 0421 12/29/20 0643 12/30/20 0430 12/30/20 1320 12/31/20 0245 12/31/20 1208  HGB 12.5  --  10.6*  --  10.0*  --   HCT 39.4  --  33.1*  --  31.8*  --   PLT 304  --  251  --  263  --   LABPROT  --   --   --   --  13.4  --   INR  --   --   --   --  1.1  --   HEPARINUNFRC 0.19*  --  <0.10* <0.10* <0.10* 0.22*  CREATININE  --  1.56* 1.44*  --   --   --     Estimated Creatinine Clearance: 66.3 mL/min (A) (by C-G formula based on SCr of 1.44 mg/dL (H)).  Assessment: 45 y.o. female with LLE ischemia s/p lysis  on heparin and warfarin  -heparin level= 0.22, hg= 10  Goal of Therapy:  Heparin level 0.3-0.7 units/ml Monitor platelets by anticoagulation protocol: Yes   Plan:  -heparin bolus 1000 units then increase to 2200 units/hr  -Heparin level in 6 hours and daily wth CBC daily  Hildred Laser, PharmD Clinical Pharmacist **Pharmacist phone directory can now be found on [email or web address] ([email or web address]).  Listed under [place].

## 2020-12-31 NOTE — TOC Benefit Eligibility Note (Signed)
Patient Teacher, English as a foreign language completed.    The patient is currently admitted and upon discharge could be taking Enoxaparin 120 mg.  The current 6 day co-pay is, $3.00.   The patient is currently admitted and upon discharge could be taking Eliquis 5 mg.  The current 30 day co-pay is, $3.00.   The patient is currently admitted and upon discharge could be taking Xarelto 15 mg.  The current 30 day co-pay is, $3.00.   The patient is insured through [place], [place] Patient Advocate Specialist [place] Team Direct Number: [phone]  Fax: [phone]

## 2020-12-31 NOTE — Progress Notes (Signed)
Inpatient Diabetes Program Recommendations  AACE/ADA: New Consensus Statement on Inpatient Glycemic Control  Target Ranges:  Prepandial:   less than 140 mg/dL      Peak postprandial:   less than 180 mg/dL (1-2 hours)      Critically ill patients:  140 - 180 mg/dL   Results for Terri Sparks, Terri Sparks (MRN [id number]) as of 12/31/2020 11:00  Ref. Range 12/30/2020 06:19 12/30/2020 10:44 12/30/2020 15:52 12/30/2020 19:02 12/30/2020 21:45 12/31/2020 06:08  Glucose-Capillary Latest Ref Range: 70 - 99 mg/dL 191 (H) 311 (H) 198 (H) 235 (H) 272 (H) 273 (H)   Review of Glycemic Control  Diabetes history: DM2 Outpatient Diabetes medications: Lantus 65-68 units QHS, Novolog 5-15 units TID with meals Current orders for Inpatient glycemic control: Novolog 0-15 units TID with meals, Novolog 0-5 units QHS  Inpatient Diabetes Program Recommendations:    Insulin: Please consider ordering Lantus 15 units Q24H and Novolog 3 units TID with meals for meal coverage if patient eats at least 50% of meals.  HbgA1C:  A1C 13.5% on 12/27/20 indicating an average glucose of 341 mg/dl over the past 2-3 months. At time of discharge, patient will need Rx for: Lantus  SoloStar pens [phone]) and Tobie Lords [phone]), pen needles [phone]), and glucose monitoring kit (#[id number]).  Thanks, Barnie Alderman, RN, MSN, CDE Diabetes Coordinator Inpatient Diabetes Program [phone] (Team Pager from 8am to 5pm)

## 2020-12-31 NOTE — Progress Notes (Signed)
[facility] for Heparin and Coumadin Indication: DVT and inschemic limb  Allergies  Allergen Reactions  . Ciprofloxacin Hcl Hives  . Dilaudid [Hydromorphone Hcl] Shortness Of Breath and Other (See Comments)    "Asystole," per pt report  . Macrobid [Nitrofurantoin Macrocrystal] Hives, Shortness Of Breath and Rash  . Other Hives, Shortness Of Breath and Rash    NO "-CILLINS"!!!  . Penicillins Shortness Of Breath    Had had cephalosporins without incident Has patient had a PCN reaction causing immediate rash, facial/tongue/throat swelling, SOB or lightheadedness with hypotension: Yes Has patient had a PCN reaction causing severe rash involving mucus membranes or skin necrosis: Unk Has patient had a PCN reaction that required hospitalization: Unk Has patient had a PCN reaction occurring within the last 10 years: No If all of the above answers are "NO", then may proceed with Cephalosporin use.   . Sulfa Antibiotics Hives, Shortness Of Breath and Rash  . Lexapro [Escitalopram Oxalate] Other (See Comments)    "I just did not like it."    Patient Measurements: Height: '5\' 7"'$  (170.2 cm) Weight: 120.4 kg (265 lb 6.9 oz) IBW/kg (Calculated) : 61.6 Heparin Dosing Weight: 86.8 kg  Vital Signs: Temp: 97.8 F (36.6 C) (03/30 2036) Temp Source: Oral (03/30 2036) BP: 117/52 (03/30 2036) Pulse Rate: 71 (03/30 2036)  Labs: Recent Labs    12/29/20 0421 12/29/20 [id number] 12/30/20 0430 12/30/20 1320 12/31/20 0245 12/31/20 1208 12/31/20 2002  HGB 12.5  --  10.6*  --  10.0*  --   --   HCT 39.4  --  33.1*  --  31.8*  --   --   PLT 304  --  251  --  263  --   --   LABPROT  --   --   --   --  13.4  --   --   INR  --   --   --   --  1.1  --   --   HEPARINUNFRC 0.19*  --  <0.10*   < > <0.10* 0.22* 0.28*  CREATININE  --  1.56* 1.44*  --   --   --   --    < > = values in this interval not displayed.    Estimated Creatinine Clearance: 66.3 mL/min (A) (by C-G  formula based on SCr of 1.44 mg/dL (H)).  Assessment: 45 y.o. female with LLE ischemia s/p lysis on heparin and warfarin  Heparin drip 2200 uts/hr heparin level 0.28 just slightly lower than goal No bleeding noted and hgb stable this am   Goal of Therapy:  Heparin level 0.3-0.7 units/ml Monitor platelets by anticoagulation protocol: Yes   Plan:  -increase heparin  to 2200 units/hr  -Heparin level daily with CBC daily    Bonnita Nasuti Pharm.D. CPP, BCPS Clinical Pharmacist [phone] 12/31/2020 8:52 PM    **Pharmacist phone directory can now be found on [email or web address] ([email or web address]).  Listed under [place].

## 2021-01-01 LAB — BASIC METABOLIC PANEL
Anion gap: 6 (ref 5–15)
BUN: 15 mg/dL (ref 6–20)
CO2: 23 mmol/L (ref 22–32)
Calcium: 9.4 mg/dL (ref 8.9–10.3)
Chloride: 101 mmol/L (ref 98–111)
Creatinine, Ser: 1.92 mg/dL — ABNORMAL HIGH (ref 0.44–1.00)
GFR, Estimated: 32 mL/min — ABNORMAL LOW (ref >60)
Glucose, Bld: 256 mg/dL — ABNORMAL HIGH (ref 70–99)
Potassium: 4.8 mmol/L (ref 3.5–5.1)
Sodium: 130 mmol/L — ABNORMAL LOW (ref 135–145)

## 2021-01-01 LAB — CBC
HCT: 33.1 % — ABNORMAL LOW (ref 36.0–46.0)
Hemoglobin: 10.1 g/dL — ABNORMAL LOW (ref 12.0–15.0)
MCH: 26.2 pg (ref 26.0–34.0)
MCHC: 30.5 g/dL (ref 30.0–36.0)
MCV: 86 fL (ref 80.0–100.0)
Platelets: 257 10*3/uL (ref 150–400)
RBC: 3.85 MIL/uL — ABNORMAL LOW (ref 3.87–5.11)
RDW: 16.1 % — ABNORMAL HIGH (ref 11.5–15.5)
WBC: 10.7 10*3/uL — ABNORMAL HIGH (ref 4.0–10.5)
nRBC: 0 % (ref 0.0–0.2)

## 2021-01-01 LAB — GLUCOSE, CAPILLARY
Glucose-Capillary: 246 mg/dL — ABNORMAL HIGH (ref 70–99)
Glucose-Capillary: 242 mg/dL — ABNORMAL HIGH (ref 70–99)
Glucose-Capillary: 199 mg/dL — ABNORMAL HIGH (ref 70–99)
Glucose-Capillary: 223 mg/dL — ABNORMAL HIGH (ref 70–99)

## 2021-01-01 LAB — HEPARIN LEVEL (UNFRACTIONATED)
Heparin Unfractionated: 0.28 IU/mL — ABNORMAL LOW (ref 0.30–0.70)
Heparin Unfractionated: 0.4 IU/mL (ref 0.30–0.70)

## 2021-01-01 LAB — PROTIME-INR
INR: 1.1 (ref 0.8–1.2)
Prothrombin Time: 13.6 seconds (ref 11.4–15.2)

## 2021-01-01 MED ORDER — ENOXAPARIN SODIUM 120 MG/0.8ML ~~LOC~~ SOLN
120.0000 mg | Freq: Two times a day (BID) | SUBCUTANEOUS | Status: DC
  Administered 2021-01-01 – 2021-01-04 (×7): 120 mg via SUBCUTANEOUS
  Filled 2021-01-01 (×10): qty 0.8

## 2021-01-01 MED ORDER — WARFARIN SODIUM 10 MG PO TABS
10.0000 mg | ORAL_TABLET | Freq: Once | ORAL | Status: AC
  Administered 2021-01-01: 10 mg via ORAL
  Filled 2021-01-01: qty 1

## 2021-01-01 MED ORDER — ATORVASTATIN CALCIUM 40 MG PO TABS
40.0000 mg | ORAL_TABLET | Freq: Every day | ORAL | Status: DC
  Administered 2021-01-02 – 2021-01-06 (×5): 40 mg via ORAL
  Filled 2021-01-01 (×5): qty 1

## 2021-01-01 MED ORDER — SODIUM CHLORIDE 0.9 % IV SOLN: INTRAVENOUS | Status: DC

## 2021-01-01 MED ORDER — PROCHLORPERAZINE EDISYLATE 10 MG/2ML IJ SOLN
10.0000 mg | Freq: Four times a day (QID) | INTRAMUSCULAR | Status: DC | PRN
  Administered 2021-01-01: 10 mg via INTRAVENOUS
  Filled 2021-01-01 (×2): qty 2

## 2021-01-01 NOTE — Evaluation (Signed)
Physical Therapy Evaluation & Discharge Patient Details Name: Terri Sparks MRN: [id number] DOB: 10-24-1975 Today's Date: 01/01/2021   History of Present Illness  Pt is a 45 y.o. female admitted 12/27/20 with L foot pain. S/p LLE mechanical thrombectomy 3/28. PMH includes DM2, PVD, HTN, DVT, anxiety, depression, multiple BLE vascular interventions, substance abuse.    Clinical Impression  Patient evaluated by Physical Therapy with no further acute PT needs identified. PTA, pt independent, lives alone but plans to d/c to daughter's home who can assist if needed. Today, pt mod indep with mobility and ADL tasks; limited by c/o L foot pain. Educ re: precautions, positioning, edema control, activity recommendations, DVT prevention. All education has been completed and the patient has no further questions. Acute PT is signing off. Thank you for this referral.    Follow Up Recommendations No PT follow up    Equipment Recommendations  None recommended by PT    Recommendations for Other Services       Precautions / Restrictions Precautions Precautions: Fall Restrictions Weight Bearing Restrictions: No      Mobility  Bed Mobility Overal bed mobility: Modified Independent                  Transfers Overall transfer level: Independent                  Ambulation/Gait Ambulation/Gait assistance: Modified independent (Device/Increase time) Gait Distance (Feet): 40 Feet Assistive device: IV Pole;None Gait Pattern/deviations: Step-through pattern;Decreased stride length;Decreased weight shift to left;Antalgic Gait velocity: Decreased   General Gait Details: Declined use of RW to offload painful LLE; mod indep ambulating with and without IV pole; antalgic gait pattern with decreased WB through L foot; pt declined further distance secondary to pain and fatigue  Stairs            Wheelchair Mobility    Modified Rankin (Stroke Patients Only)       Balance  Overall balance assessment: Mild deficits observed, not formally tested                                           Pertinent Vitals/Pain Pain Assessment: Faces Faces Pain Scale: Hurts even more Pain Location: L foot Pain Descriptors / Indicators: Discomfort Pain Intervention(s): Limited activity within patient's tolerance;Repositioned;Patient requesting pain meds-RN notified    Home Living Family/patient expects to be discharged to:: Private residence Living Arrangements: Children Available Help at Discharge: Family;Available 24 hours/day Type of Home: House Home Access: Level entry     Home Layout: One level Home Equipment: Walker - 2 wheels;Cane - single point Additional Comments: Plans to d/c to daughter's home    Prior Function Level of Independence: Independent               Hand Dominance        Extremity/Trunk Assessment   Upper Extremity Assessment Upper Extremity Assessment: Overall WFL for tasks assessed    Lower Extremity Assessment Lower Extremity Assessment: LLE deficits/detail LLE Deficits / Details: Strength WFL, limited by c/o L foot pain       Communication   Communication: No difficulties  Cognition Arousal/Alertness: Awake/alert Behavior During Therapy: WFL for tasks assessed/performed Overall Cognitive Status: Within Functional Limits for tasks assessed  General Comments General comments (skin integrity, edema, etc.): Educ re: precautions, positioning, edema control, activity recommendations    Exercises     Assessment/Plan    PT Assessment Patent does not need any further PT services  PT Problem List         PT Treatment Interventions      PT Goals (Current goals can be found in the Care Plan section)  Acute Rehab PT Goals PT Goal Formulation: All assessment and education complete, DC therapy    Frequency     Barriers to discharge         Co-evaluation               AM-PAC PT "6 Clicks" Mobility  Outcome Measure Help needed turning from your back to your side while in a flat bed without using bedrails?: None Help needed moving from lying on your back to sitting on the side of a flat bed without using bedrails?: None Help needed moving to and from a bed to a chair (including a wheelchair)?: None Help needed standing up from a chair using your arms (e.g., wheelchair or bedside chair)?: None Help needed to walk in hospital room?: None Help needed climbing 3-5 steps with a railing? : A Little 6 Click Score: 23    End of Session   Activity Tolerance: Patient tolerated treatment well;Patient limited by pain Patient left: in bed;with call bell/phone within reach Nurse Communication: Mobility status PT Visit Diagnosis: Other abnormalities of gait and mobility (R26.89);Pain Pain - Right/Left: Left Pain - part of body: Ankle and joints of foot    Time: [id number] PT Time Calculation (min) (ACUTE ONLY): 11 min   Charges:   PT Evaluation $PT Eval Low Complexity: 1 Low     Mabeline Caras, PT, DPT Acute Rehabilitation Services  Pager [phone] Office [place] 01/01/2021, 3:11 PM

## 2021-01-01 NOTE — Progress Notes (Addendum)
  Progress Note    01/01/2021 9:15 AM 3 Days Post-Op  Subjective: Swelling in left foot is her only issue  Vitals:   01/01/21 0244 01/01/21 0806  BP: 135/78 135/68  Pulse:  (!) 103  Resp: 20 19  Temp: 98.4 F (36.9 C) 98.2 F (36.8 C)  SpO2: 99% 100%    Physical Exam: Awake alert oriented Left upper arm hematoma stable Strong left ulnar signal hand is warm and well-perfused Strong left foot signal Nonpitting edema left leg below the knee she is tender to palpation where there is a previous bruise on the distal lateral leg  CBC    Component Value Date/Time   WBC 10.7 (H) 01/01/2021 0132   RBC 3.85 (L) 01/01/2021 0132   HGB 10.1 (L) 01/01/2021 0132   HGB 14.6 01/21/2020 1603   HCT 33.1 (L) 01/01/2021 0132   HCT 44.1 01/21/2020 1603   PLT 257 01/01/2021 0132   PLT 490 (H) 01/27/2018 1157   MCV 86.0 01/01/2021 0132   MCV 89 01/21/2020 1603   MCH 26.2 01/01/2021 0132   MCHC 30.5 01/01/2021 0132   RDW 16.1 (H) 01/01/2021 0132   RDW 13.5 01/21/2020 1603   LYMPHSABS 3.0 12/27/2020 1113   LYMPHSABS 3.1 01/21/2020 1603   MONOABS 0.8 12/27/2020 1113   EOSABS 0.4 12/27/2020 1113   EOSABS 0.4 01/21/2020 1603   BASOSABS 0.1 12/27/2020 1113   BASOSABS 0.1 01/21/2020 1603    BMET    Component Value Date/Time   NA 130 (L) 01/01/2021 0132   NA 135 07/29/2020 1528   K 4.8 01/01/2021 0132   CL 101 01/01/2021 0132   CO2 23 01/01/2021 0132   GLUCOSE 256 (H) 01/01/2021 0132   BUN 15 01/01/2021 0132   BUN 19 07/29/2020 1528   CREATININE 1.92 (H) 01/01/2021 0132   CREATININE 1.88 (H) 02/19/2020 1555   CALCIUM 9.4 01/01/2021 0132   GFRNONAA 32 (L) 01/01/2021 0132   GFRNONAA 32 (L) 02/19/2020 1555   GFRAA 30 (L) 07/29/2020 1528   GFRAA 37 (L) 02/19/2020 1555    INR    Component Value Date/Time   INR 1.1 01/01/2021 0132     Intake/Output Summary (Last 24 hours) at 01/01/2021 0915 Last data filed at 01/01/2021 0730 Gross per 24 hour  Intake 565.81 ml  Output --   Net 565.81 ml     Assessment:  45 y.o. female is status post left lower extremity revascularization.  Overall progressing well.  Plan: Creatinine elevated today some from previous although closer to her baseline, will restart IV fluids recheck labs tomorrow  Plan to change to lovenox today as bridge to therapeutic inr  Will dc on high intensity statin  Continue out of bed as tolerated, she has apparently been walking regularly.   Hajer Dwyer C. Donzetta Matters, MD [facility] [place] Office: [phone] Pager: [phone]  01/01/2021 9:15 AM

## 2021-01-01 NOTE — Progress Notes (Signed)
Walked several laps in halls independently this pm. Tolerated well.

## 2021-01-01 NOTE — Progress Notes (Signed)
[place] for Heparin Indication: DVT and inschemic limb  Allergies  Allergen Reactions  . Ciprofloxacin Hcl Hives  . Dilaudid [Hydromorphone Hcl] Shortness Of Breath and Other (See Comments)    "Asystole," per pt report  . Macrobid [Nitrofurantoin Macrocrystal] Hives, Shortness Of Breath and Rash  . Other Hives, Shortness Of Breath and Rash    NO "-CILLINS"!!!  . Penicillins Shortness Of Breath    Had had cephalosporins without incident Has patient had a PCN reaction causing immediate rash, facial/tongue/throat swelling, SOB or lightheadedness with hypotension: Yes Has patient had a PCN reaction causing severe rash involving mucus membranes or skin necrosis: Unk Has patient had a PCN reaction that required hospitalization: Unk Has patient had a PCN reaction occurring within the last 10 years: No If all of the above answers are "NO", then may proceed with Cephalosporin use.   . Sulfa Antibiotics Hives, Shortness Of Breath and Rash  . Lexapro [Escitalopram Oxalate] Other (See Comments)    "I just did not like it."    Patient Measurements: Height: '5\' 7"'$  (170.2 cm) Weight: 120.4 kg (265 lb 6.9 oz) IBW/kg (Calculated) : 61.6 Heparin Dosing Weight: 86.8 kg  Vital Signs: Temp: 98.4 F (36.9 C) (03/31 0244) Temp Source: Oral (03/31 0244) BP: 135/78 (03/31 0244) Pulse Rate: 71 (03/30 2036)  Labs: Recent Labs    12/29/20 0643 12/30/20 0430 12/30/20 1320 12/31/20 0245 12/31/20 1208 12/31/20 2002 01/01/21 0132  HGB  --  10.6*  --  10.0*  --   --  10.1*  HCT  --  33.1*  --  31.8*  --   --  33.1*  PLT  --  251  --  263  --   --  257  LABPROT  --   --   --  13.4  --   --   --   INR  --   --   --  1.1  --   --   --   HEPARINUNFRC  --  <0.10*   < > <0.10* 0.22* 0.28* 0.28*  CREATININE 1.56* 1.44*  --   --   --   --  1.92*   < > = values in this interval not displayed.    Estimated Creatinine Clearance: 49.7 mL/min (A) (by C-G formula  based on SCr of 1.92 mg/dL (H)).  Assessment: 45 y.o. female with LLE ischemia s/p lysis on heparin and warfarin   Heparin level down to slightly subtherapeutic (0.28) on gtt at 2400 units/hr. No issues with line or bleeding reported per RN.  Goal of Therapy:  Heparin level 0.3-0.7 units/ml Monitor platelets by anticoagulation protocol: Yes   Plan:  Increase heparin to 2600 units/hr F/u 6 hr heparin level  Sherlon Handing, PharmD, BCPS Please see amion for complete clinical pharmacist phone list 01/01/2021 2:55 AM

## 2021-01-01 NOTE — Progress Notes (Signed)
Inpatient Diabetes Program Recommendations  AACE/ADA: New Consensus Statement on Inpatient Glycemic Control (2015)  Target Ranges:  Prepandial:   less than 140 mg/dL      Peak postprandial:   less than 180 mg/dL (1-2 hours)      Critically ill patients:  140 - 180 mg/dL   Lab Results  Component Value Date   GLUCAP 242 (H) 01/01/2021   HGBA1C 13.5 (H) 12/27/2020    Review of Glycemic Control  CBGs 246, 242 mg/dL today. HgbA1C - 13.5% Has received 10 units of Novolog correction thus far today. Eating 100%  Current orders for Inpatient glycemic control: Lantus 15 units QD, Novolog 0-15 units TID with meals and 0-5 HS  Inpatient Diabetes Program Recommendations:     Increase Lantus to 20 units QD Add meal coverage insulin - Novolog 4 units TID with meals if eating > 50% meal  Follow glucose trends.  Thank you. Lorenda Peck, RD, LDN, CDE Inpatient Diabetes Coordinator [phone]

## 2021-01-01 NOTE — Progress Notes (Signed)
[place] for Heparin Indication: DVT and inschemic limb  Allergies  Allergen Reactions  . Ciprofloxacin Hcl Hives  . Dilaudid [Hydromorphone Hcl] Shortness Of Breath and Other (See Comments)    "Asystole," per pt report  . Macrobid [Nitrofurantoin Macrocrystal] Hives, Shortness Of Breath and Rash  . Other Hives, Shortness Of Breath and Rash    NO "-CILLINS"!!!  . Penicillins Shortness Of Breath    Had had cephalosporins without incident Has patient had a PCN reaction causing immediate rash, facial/tongue/throat swelling, SOB or lightheadedness with hypotension: Yes Has patient had a PCN reaction causing severe rash involving mucus membranes or skin necrosis: Unk Has patient had a PCN reaction that required hospitalization: Unk Has patient had a PCN reaction occurring within the last 10 years: No If all of the above answers are "NO", then may proceed with Cephalosporin use.   . Sulfa Antibiotics Hives, Shortness Of Breath and Rash  . Lexapro [Escitalopram Oxalate] Other (See Comments)    "I just did not like it."    Patient Measurements: Height: '5\' 7"'$  (170.2 cm) Weight: 120.4 kg (265 lb 6.9 oz) IBW/kg (Calculated) : 61.6 Heparin Dosing Weight: 86.8 kg  Vital Signs: Temp: 98 F (36.7 C) (03/31 1218) Temp Source: Oral (03/31 1218) BP: 121/71 (03/31 1218) Pulse Rate: 98 (03/31 1218)  Labs: Recent Labs    12/30/20 0430 12/30/20 1320 12/31/20 0245 12/31/20 1208 12/31/20 2002 01/01/21 0132 01/01/21 1031  HGB 10.6*  --  10.0*  --   --  10.1*  --   HCT 33.1*  --  31.8*  --   --  33.1*  --   PLT 251  --  263  --   --  257  --   LABPROT  --   --  13.4  --   --  13.6  --   INR  --   --  1.1  --   --  1.1  --   HEPARINUNFRC <0.10*   < > <0.10*   < > 0.28* 0.28* 0.40  CREATININE 1.44*  --   --   --   --  1.92*  --    < > = values in this interval not displayed.    Estimated Creatinine Clearance: 49.7 mL/min (A) (by C-G formula based  on SCr of 1.92 mg/dL (H)).  Assessment: 45 y.o. female with LLE ischemia s/p lysis on heparin and warfarin. Plans are to change to lovenox -INR= 1.1 -Hg= 10.1 -SCr= 1.92 (up from 1.44; baseline 1.6-2.2 but history is quite variable)    Goal of Therapy:  Heparin level 0.3-0.7 units/ml Monitor platelets by anticoagulation protocol: Yes   Plan:  -Coumadin '10mg'$  x1 -Continue heparin until 8pm then start lovenox '120mg'$  sq q12h -BMET in am -Cost of outpatient lovenox for 5-7 days is $3   Hildred Laser, PharmD Clinical Pharmacist **Pharmacist phone directory can now be found on [email or web address] ([email or web address]).  Listed under [place].

## 2021-01-01 NOTE — Progress Notes (Signed)
[place] for Heparin Indication: DVT and inschemic limb  Allergies  Allergen Reactions  . Ciprofloxacin Hcl Hives  . Dilaudid [Hydromorphone Hcl] Shortness Of Breath and Other (See Comments)    "Asystole," per pt report  . Macrobid [Nitrofurantoin Macrocrystal] Hives, Shortness Of Breath and Rash  . Other Hives, Shortness Of Breath and Rash    NO "-CILLINS"!!!  . Penicillins Shortness Of Breath    Had had cephalosporins without incident Has patient had a PCN reaction causing immediate rash, facial/tongue/throat swelling, SOB or lightheadedness with hypotension: Yes Has patient had a PCN reaction causing severe rash involving mucus membranes or skin necrosis: Unk Has patient had a PCN reaction that required hospitalization: Unk Has patient had a PCN reaction occurring within the last 10 years: No If all of the above answers are "NO", then may proceed with Cephalosporin use.   . Sulfa Antibiotics Hives, Shortness Of Breath and Rash  . Lexapro [Escitalopram Oxalate] Other (See Comments)    "I just did not like it."    Patient Measurements: Height: '5\' 7"'$  (170.2 cm) Weight: 120.4 kg (265 lb 6.9 oz) IBW/kg (Calculated) : 61.6 Heparin Dosing Weight: 86.8 kg  Vital Signs: Temp: 98 F (36.7 C) (03/31 1218) Temp Source: Oral (03/31 1218) BP: 121/71 (03/31 1218) Pulse Rate: 98 (03/31 1218)  Labs: Recent Labs    12/30/20 0430 12/30/20 1320 12/31/20 0245 12/31/20 1208 12/31/20 2002 01/01/21 0132 01/01/21 1031  HGB 10.6*  --  10.0*  --   --  10.1*  --   HCT 33.1*  --  31.8*  --   --  33.1*  --   PLT 251  --  263  --   --  257  --   LABPROT  --   --  13.4  --   --  13.6  --   INR  --   --  1.1  --   --  1.1  --   HEPARINUNFRC <0.10*   < > <0.10*   < > 0.28* 0.28* 0.40  CREATININE 1.44*  --   --   --   --  1.92*  --    < > = values in this interval not displayed.    Estimated Creatinine Clearance: 49.7 mL/min (A) (by C-G formula based  on SCr of 1.92 mg/dL (H)).  Assessment: 45 y.o. female with LLE ischemia s/p lysis on heparin and warfarin. Plans are to change to lovenox -INR= 1.1 -Hg= 10.1 -SCr= 1.92 (up from 1.44; baseline 1.6-2.2 but history is quite variable)    Goal of Therapy:  Heparin level 0.3-0.7 units/ml Monitor platelets by anticoagulation protocol: Yes   Plan:  -Coumadin '10mg'$  x1 -Continue heparin at 2600 units/hr until 8pm then start lovenox '120mg'$  sq q12h -BMET in am -Cost of outpatient lovenox for 5-7 days is $3   Hildred Laser, PharmD Clinical Pharmacist **Pharmacist phone directory can now be found on [email or web address] ([email or web address]).  Listed under [place].

## 2021-01-01 NOTE — Progress Notes (Signed)
Mobility Specialist: Progress Note   01/01/21 1506  Mobility  Activity Refused mobility   Pt refused mobility stating she doesn't feel well and she just finished working with PT. Will f/u as able.   [facility] Gerlad Pelzel Mobility Specialist Mobility Specialist Phone: [phone]

## 2021-01-02 ENCOUNTER — Other Ambulatory Visit: Payer: Self-pay | Admitting: Physician Assistant

## 2021-01-02 LAB — CBC
HCT: 34.1 % — ABNORMAL LOW (ref 36.0–46.0)
Hemoglobin: 10.6 g/dL — ABNORMAL LOW (ref 12.0–15.0)
MCH: 26.4 pg (ref 26.0–34.0)
MCHC: 31.1 g/dL (ref 30.0–36.0)
MCV: 84.8 fL (ref 80.0–100.0)
Platelets: 321 10*3/uL (ref 150–400)
RBC: 4.02 MIL/uL (ref 3.87–5.11)
RDW: 16.2 % — ABNORMAL HIGH (ref 11.5–15.5)
WBC: 8.6 10*3/uL (ref 4.0–10.5)
nRBC: 0 % (ref 0.0–0.2)

## 2021-01-02 LAB — BASIC METABOLIC PANEL
Anion gap: 7 (ref 5–15)
BUN: 14 mg/dL (ref 6–20)
CO2: 23 mmol/L (ref 22–32)
Calcium: 9.2 mg/dL (ref 8.9–10.3)
Chloride: 101 mmol/L (ref 98–111)
Creatinine, Ser: 1.97 mg/dL — ABNORMAL HIGH (ref 0.44–1.00)
GFR, Estimated: 31 mL/min — ABNORMAL LOW (ref >60)
Glucose, Bld: 287 mg/dL — ABNORMAL HIGH (ref 70–99)
Potassium: 4.6 mmol/L (ref 3.5–5.1)
Sodium: 131 mmol/L — ABNORMAL LOW (ref 135–145)

## 2021-01-02 LAB — PROTIME-INR
INR: 1.4 — ABNORMAL HIGH (ref 0.8–1.2)
Prothrombin Time: 16.7 seconds — ABNORMAL HIGH (ref 11.4–15.2)

## 2021-01-02 LAB — GLUCOSE, CAPILLARY
Glucose-Capillary: 277 mg/dL — ABNORMAL HIGH (ref 70–99)
Glucose-Capillary: 179 mg/dL — ABNORMAL HIGH (ref 70–99)
Glucose-Capillary: 217 mg/dL — ABNORMAL HIGH (ref 70–99)
Glucose-Capillary: 209 mg/dL — ABNORMAL HIGH (ref 70–99)

## 2021-01-02 MED ORDER — ATORVASTATIN CALCIUM 40 MG PO TABS: 40.0000 mg | ORAL_TABLET | Freq: Every day | ORAL | 3 refills | Status: DC

## 2021-01-02 MED ORDER — INSULIN GLARGINE 100 UNIT/ML ~~LOC~~ SOLN
20.0000 [IU] | Freq: Every day | SUBCUTANEOUS | Status: DC
  Administered 2021-01-02 – 2021-01-06 (×5): 20 [IU] via SUBCUTANEOUS
  Filled 2021-01-02 (×5): qty 0.2

## 2021-01-02 MED ORDER — HYDROXYZINE HCL 50 MG PO TABS: 50.0000 mg | ORAL_TABLET | Freq: Three times a day (TID) | ORAL | 0 refills | Status: DC | PRN

## 2021-01-02 MED ORDER — OXYCODONE HCL 5 MG PO TABS: 5.0000 mg | ORAL_TABLET | ORAL | 0 refills | Status: DC | PRN

## 2021-01-02 MED ORDER — INSULIN ASPART 100 UNIT/ML FLEXPEN: PEN_INJECTOR | SUBCUTANEOUS | 0 refills | Status: DC

## 2021-01-02 MED ORDER — INSULIN GLARGINE 100 UNIT/ML SOLOSTAR PEN: 20.0000 [IU] | PEN_INJECTOR | Freq: Every day | SUBCUTANEOUS | 0 refills | Status: DC

## 2021-01-02 MED ORDER — NICOTINE 21 MG/24HR TD PT24: 21.0000 mg | MEDICATED_PATCH | Freq: Every day | TRANSDERMAL | 0 refills | Status: DC

## 2021-01-02 MED ORDER — WARFARIN SODIUM 10 MG PO TABS
10.0000 mg | ORAL_TABLET | Freq: Once | ORAL | Status: AC
  Administered 2021-01-02: 10 mg via ORAL
  Filled 2021-01-02: qty 1

## 2021-01-02 MED ORDER — ENOXAPARIN SODIUM 120 MG/0.8ML ~~LOC~~ SOLN: 120.0000 mg | Freq: Two times a day (BID) | SUBCUTANEOUS | 0 refills | Status: DC

## 2021-01-02 MED ORDER — ASPIRIN 81 MG PO TBEC: 81.0000 mg | DELAYED_RELEASE_TABLET | Freq: Every day | ORAL | 3 refills | Status: DC

## 2021-01-02 MED ORDER — WARFARIN SODIUM 5 MG PO TABS: 5.0000 mg | ORAL_TABLET | ORAL | 0 refills | Status: DC

## 2021-01-02 MED FILL — NOVOLOG FLEXPEN SYRINGE: 100 | 30 days supply | Qty: 6 | Fill #0

## 2021-01-02 MED FILL — hydrOXYzine HCL 25 MG TABS: 25 | 20 days supply | Qty: 120 | Fill #0

## 2021-01-02 MED FILL — LANTUS SOLOSTAR 100 UNITS/M: 100 | 30 days supply | Qty: 6 | Fill #0

## 2021-01-02 MED FILL — NICOTINE 21 MG/24HR PATCH: 21 | 28 days supply | Qty: 28 | Fill #0

## 2021-01-02 MED FILL — ASPIRIN LOW DOSE 81 MG TBEC: 81 | 90 days supply | Qty: 90 | Fill #0

## 2021-01-02 MED FILL — PENTIPS 32G X 4 MM MISC: 32G X 4 MM | 30 days supply | Qty: 100 | Fill #0

## 2021-01-02 MED FILL — ATORVASTATIN CALCIUM 40 MG: 40 | 90 days supply | Qty: 90 | Fill #0

## 2021-01-02 MED FILL — WARFARIN SODIUM 5 MG TABLET: 5 | 30 days supply | Qty: 40 | Fill #0

## 2021-01-02 MED FILL — oxyCODONE HCL 5 MG TABS: 5 | 5 days supply | Qty: 30 | Fill #0

## 2021-01-02 MED FILL — ENOXAPARIN SODIUM 120 MG/0.: 120 | 3 days supply | Qty: 5 | Fill #0

## 2021-01-02 NOTE — Progress Notes (Addendum)
  Progress Note    01/02/2021 7:25 AM 4 Days Post-Op  Subjective:  No complaints.  Plans to go home tomorrow with her daughter   Vitals:   01/01/21 2315 01/02/21 0325  BP: (!) 148/87 (!) 142/73  Pulse:  89  Resp: 20 15  Temp: 97.7 F (36.5 C) 98 F (36.7 C)  SpO2: 98% 98%   Physical Exam: Lungs:  Non labored Incisions:  R groin soft Extremities:  Feet symmetrically warm; L DP by doppler Neurologic: A&O  CBC    Component Value Date/Time   WBC 8.6 01/02/2021 0128   RBC 4.02 01/02/2021 0128   HGB 10.6 (L) 01/02/2021 0128   HGB 14.6 01/21/2020 1603   HCT 34.1 (L) 01/02/2021 0128   HCT 44.1 01/21/2020 1603   PLT 321 01/02/2021 0128   PLT 490 (H) 01/27/2018 1157   MCV 84.8 01/02/2021 0128   MCV 89 01/21/2020 1603   MCH 26.4 01/02/2021 0128   MCHC 31.1 01/02/2021 0128   RDW 16.2 (H) 01/02/2021 0128   RDW 13.5 01/21/2020 1603   LYMPHSABS 3.0 12/27/2020 1113   LYMPHSABS 3.1 01/21/2020 1603   MONOABS 0.8 12/27/2020 1113   EOSABS 0.4 12/27/2020 1113   EOSABS 0.4 01/21/2020 1603   BASOSABS 0.1 12/27/2020 1113   BASOSABS 0.1 01/21/2020 1603    BMET    Component Value Date/Time   NA 131 (L) 01/02/2021 0128   NA 135 07/29/2020 1528   K 4.6 01/02/2021 0128   CL 101 01/02/2021 0128   CO2 23 01/02/2021 0128   GLUCOSE 287 (H) 01/02/2021 0128   BUN 14 01/02/2021 0128   BUN 19 07/29/2020 1528   CREATININE 1.97 (H) 01/02/2021 0128   CREATININE 1.88 (H) 02/19/2020 1555   CALCIUM 9.2 01/02/2021 0128   GFRNONAA 31 (L) 01/02/2021 0128   GFRNONAA 32 (L) 02/19/2020 1555   GFRAA 30 (L) 07/29/2020 1528   GFRAA 37 (L) 02/19/2020 1555    INR    Component Value Date/Time   INR 1.4 (H) 01/02/2021 0128     Intake/Output Summary (Last 24 hours) at 01/02/2021 0725 Last data filed at 01/02/2021 0423 Gross per 24 hour  Intake 1914.13 ml  Output --  Net 1914.13 ml     Assessment/Plan:  45 y.o. female is s/p LLE revascularization 4 Days Post-Op   BLE well perfused based on  doppler exam Renal function stable INR 1.4; continue lovenox and coumadin Likely home tomorrow with her daughter   Iline Oven [facility] [phone] 01/02/2021 7:25 AM  I have independently interviewed and examined patient and agree with PA assessment and plan above. Likely dc this weekend with lovenox bridge to coumadin.   Raha Tennison C. Donzetta Matters, MD [facility] [place] Office: [phone] Pager: [phone]

## 2021-01-02 NOTE — Plan of Care (Signed)

## 2021-01-02 NOTE — Progress Notes (Signed)
[place] for Heparin Indication: DVT and inschemic limb  Allergies  Allergen Reactions  . Ciprofloxacin Hcl Hives  . Dilaudid [Hydromorphone Hcl] Shortness Of Breath and Other (See Comments)    "Asystole," per pt report  . Macrobid [Nitrofurantoin Macrocrystal] Hives, Shortness Of Breath and Rash  . Other Hives, Shortness Of Breath and Rash    NO "-CILLINS"!!!  . Penicillins Shortness Of Breath    Had had cephalosporins without incident Has patient had a PCN reaction causing immediate rash, facial/tongue/throat swelling, SOB or lightheadedness with hypotension: Yes Has patient had a PCN reaction causing severe rash involving mucus membranes or skin necrosis: Unk Has patient had a PCN reaction that required hospitalization: Unk Has patient had a PCN reaction occurring within the last 10 years: No If all of the above answers are "NO", then may proceed with Cephalosporin use.   . Sulfa Antibiotics Hives, Shortness Of Breath and Rash  . Lexapro [Escitalopram Oxalate] Other (See Comments)    "I just did not like it."    Patient Measurements: Height: '5\' 7"'$  (170.2 cm) Weight: 120.4 kg (265 lb 6.9 oz) IBW/kg (Calculated) : 61.6 Heparin Dosing Weight: 86.8 kg  Vital Signs: Temp: 98.2 F (36.8 C) (04/01 0734) Temp Source: Oral (04/01 0734) BP: 152/88 (04/01 0734) Pulse Rate: 95 (04/01 0734)  Labs: Recent Labs    12/31/20 0245 12/31/20 1208 12/31/20 2002 01/01/21 0132 01/01/21 1031 01/02/21 0128  HGB 10.0*  --   --  10.1*  --  10.6*  HCT 31.8*  --   --  33.1*  --  34.1*  PLT 263  --   --  257  --  321  LABPROT 13.4  --   --  13.6  --  16.7*  INR 1.1  --   --  1.1  --  1.4*  HEPARINUNFRC <0.10*   < > 0.28* 0.28* 0.40  --   CREATININE  --   --   --  1.92*  --  1.97*   < > = values in this interval not displayed.    Estimated Creatinine Clearance: 48.4 mL/min (A) (by C-G formula based on SCr of 1.97 mg/dL (H)).  Assessment: 45 y.o.  female with LLE ischemia s/p lysis on lovenox and warfarin (Day 3 of overlap). Plans noted for discharge on 4/2 -INR= 1.4 -Hg= 10.6 -SCr= 1.97 ( baseline 1.6-2.2 but history is quite variable)    Goal of Therapy:  Heparin level 0.3-0.7 units/ml Monitor platelets by anticoagulation protocol: Yes   Plan:  -Coumadin '10mg'$  x1 -Continue lovenox '120mg'$  sq q12h -Will send home medications to [facility] today. Plans will to provide enough lovenox to cover three days -Consider checking an INR early next week   Hildred Laser, PharmD Clinical Pharmacist **Pharmacist phone directory can now be found on [email or web address] ([email or web address]).  Listed under [place].

## 2021-01-02 NOTE — Discharge Instructions (Signed)
° °  [facility] [place] ° °Discharge Instructions ° °Lower Extremity Angiogram; Angioplasty/Stenting ° °Please refer to the following instructions for your post-procedure care. Your surgeon or physician assistant will discuss any changes with you. ° °Activity ° °Avoid lifting more than 8 pounds (1 gallons of milk) for 72 hours (3 days) after your procedure. You may walk as much as you can tolerate. It's OK to drive after 72 hours. ° °Bathing/Showering ° °You may shower the day after your procedure. If you have a bandage, you may remove it at 24- 48 hours. Clean your incision site with mild soap and water. Pat the area dry with a clean towel. ° °Diet ° °Resume your pre-procedure diet. There are no special food restrictions following this procedure. All patients with peripheral vascular disease should follow a low fat/low cholesterol diet. In order to heal from your surgery, it is CRITICAL to get adequate nutrition. Your body requires vitamins, minerals, and protein. Vegetables are the best source of vitamins and minerals. Vegetables also provide the perfect balance of protein. Processed food has little nutritional value, so try to avoid this. ° °Medications ° °Resume taking all of your medications unless your doctor tells you not to. If your incision is causing pain, you may take over-the-counter pain relievers such as acetaminophen (Tylenol) ° °Follow Up ° °Follow up will be arranged at the time of your procedure. You may have an office visit scheduled or may be scheduled for surgery. Ask your surgeon if you have any questions. ° °Please call us immediately for any of the following conditions: °•Severe or worsening pain your legs or feet at rest or with walking. °•Increased pain, redness, drainage at your groin puncture site. °•Fever of 101 degrees or higher. °•If you have any mild or slow bleeding from your puncture site: lie down, apply firm constant pressure over the area with a piece of  gauze or a clean wash cloth for 30 minutes- no peeking!, call 911 right away if you are still bleeding after 30 minutes, or if the bleeding is heavy and unmanageable. ° °Reduce your risk factors of vascular disease: ° °Stop smoking. If you would like help call [facility] at 1-800-QUIT-NOW (1-800-784-8669) or [place] at 336-586-4000. °Manage your cholesterol °Maintain a desired weight °Control your diabetes °Keep your blood pressure down ° °If you have any questions, please call the office at [phone] ° °

## 2021-01-03 ENCOUNTER — Other Ambulatory Visit: Payer: Self-pay

## 2021-01-03 LAB — PROTIME-INR
INR: 2 — ABNORMAL HIGH (ref 0.8–1.2)
Prothrombin Time: 22 seconds — ABNORMAL HIGH (ref 11.4–15.2)

## 2021-01-03 LAB — GLUCOSE, CAPILLARY
Glucose-Capillary: 234 mg/dL — ABNORMAL HIGH (ref 70–99)
Glucose-Capillary: 201 mg/dL — ABNORMAL HIGH (ref 70–99)
Glucose-Capillary: 231 mg/dL — ABNORMAL HIGH (ref 70–99)
Glucose-Capillary: 165 mg/dL — ABNORMAL HIGH (ref 70–99)

## 2021-01-03 MED ORDER — WARFARIN SODIUM 5 MG PO TABS
5.0000 mg | ORAL_TABLET | Freq: Once | ORAL | Status: AC
  Administered 2021-01-03: 5 mg via ORAL
  Filled 2021-01-03: qty 1

## 2021-01-03 MED ORDER — MAGNESIUM CITRATE PO SOLN
300.0000 mL | Freq: Once | ORAL | Status: AC
  Administered 2021-01-03: 300 mL via ORAL
  Filled 2021-01-03: qty 592

## 2021-01-03 NOTE — Progress Notes (Addendum)
  Progress Note    01/03/2021 8:46 AM 5 Days Post-Op  Subjective:  Wants to go home tomorrow.  No pain L foot   Vitals:   01/02/21 2359 01/03/21 0524  BP: (!) 157/95 (!) 162/91  Pulse: 93 95  Resp: 12 19  Temp: 97.9 F (36.6 C) 98 F (36.7 C)  SpO2: 100% 96%   Physical Exam: Lungs:  Non labored Incisions:  R groin soft Extremities:  L DP brisk by doppler Neurologic: A&O  CBC    Component Value Date/Time   WBC 8.6 01/02/2021 0128   RBC 4.02 01/02/2021 0128   HGB 10.6 (L) 01/02/2021 0128   HGB 14.6 01/21/2020 1603   HCT 34.1 (L) 01/02/2021 0128   HCT 44.1 01/21/2020 1603   PLT 321 01/02/2021 0128   PLT 490 (H) 01/27/2018 1157   MCV 84.8 01/02/2021 0128   MCV 89 01/21/2020 1603   MCH 26.4 01/02/2021 0128   MCHC 31.1 01/02/2021 0128   RDW 16.2 (H) 01/02/2021 0128   RDW 13.5 01/21/2020 1603   LYMPHSABS 3.0 12/27/2020 1113   LYMPHSABS 3.1 01/21/2020 1603   MONOABS 0.8 12/27/2020 1113   EOSABS 0.4 12/27/2020 1113   EOSABS 0.4 01/21/2020 1603   BASOSABS 0.1 12/27/2020 1113   BASOSABS 0.1 01/21/2020 1603    BMET    Component Value Date/Time   NA 131 (L) 01/02/2021 0128   NA 135 07/29/2020 1528   K 4.6 01/02/2021 0128   CL 101 01/02/2021 0128   CO2 23 01/02/2021 0128   GLUCOSE 287 (H) 01/02/2021 0128   BUN 14 01/02/2021 0128   BUN 19 07/29/2020 1528   CREATININE 1.97 (H) 01/02/2021 0128   CREATININE 1.88 (H) 02/19/2020 1555   CALCIUM 9.2 01/02/2021 0128   GFRNONAA 31 (L) 01/02/2021 0128   GFRNONAA 32 (L) 02/19/2020 1555   GFRAA 30 (L) 07/29/2020 1528   GFRAA 37 (L) 02/19/2020 1555    INR    Component Value Date/Time   INR 2.0 (H) 01/03/2021 0134     Intake/Output Summary (Last 24 hours) at 01/03/2021 0846 Last data filed at 01/03/2021 0403 Gross per 24 hour  Intake 1722.38 ml  Output --  Net 1722.38 ml     Assessment/Plan:  45 y.o. female is s/p LLE revascularization 5 Days Post-Op   BLE well perfused without rest pain LLE somewhat  edematous; d/c IVF and encouraged elevation when in bed Lovenox and coumadin per pharmacy [facility] consulted for transportation home tomorrow    Dagoberto Ligas, PA-C [facility] [phone] 01/03/2021 8:46 AM   I have seen and evaluated the patient. I agree with the PA note as documented above.  45 year old female status post thrombolysis of left leg.  She has left Doppler signal on the lateral foot.  She is having no pain in the foot.  She does feel she needs 1 more day.  Plan was discharge on Lovenox bridge with Coumadin.  Today her INR is 2.0.  We will recheck an INR tomorrow and ultimately may not need a bridge which I discussed with her today.  Overall looks good.  No complaints  Marty Heck, MD [facility] [place] Office: [phone]

## 2021-01-03 NOTE — Progress Notes (Signed)
Mobility Specialist - Progress Note   01/03/21 1504  Mobility  Activity Ambulated in hall  Level of Assistance Independent  Assistive Device None  Distance Ambulated (ft) 470 ft  Mobility Response Tolerated well  Mobility performed by Mobility specialist  $Mobility charge 1 Mobility   Pt asx throughout ambulation. HR remained ~115 throughout. Pt sitting up on edge of bed after walk. She requested pain meds and her wash-up, RN notified.   Pricilla Handler Mobility Specialist Mobility Specialist Phone: [phone]

## 2021-01-03 NOTE — Progress Notes (Signed)
[place] for enoxaparin>warfarin Indication: DVT and inschemic limb  Allergies  Allergen Reactions  . Ciprofloxacin Hcl Hives  . Dilaudid [Hydromorphone Hcl] Shortness Of Breath and Other (See Comments)    "Asystole," per pt report  . Macrobid [Nitrofurantoin Macrocrystal] Hives, Shortness Of Breath and Rash  . Other Hives, Shortness Of Breath and Rash    NO "-CILLINS"!!!  . Penicillins Shortness Of Breath    Had had cephalosporins without incident Has patient had a PCN reaction causing immediate rash, facial/tongue/throat swelling, SOB or lightheadedness with hypotension: Yes Has patient had a PCN reaction causing severe rash involving mucus membranes or skin necrosis: Unk Has patient had a PCN reaction that required hospitalization: Unk Has patient had a PCN reaction occurring within the last 10 years: No If all of the above answers are "NO", then may proceed with Cephalosporin use.   . Sulfa Antibiotics Hives, Shortness Of Breath and Rash  . Lexapro [Escitalopram Oxalate] Other (See Comments)    "I just did not like it."    Patient Measurements: Height: '5\' 7"'$  (170.2 cm) Weight: 120.4 kg (265 lb 6.9 oz) IBW/kg (Calculated) : 61.6 Heparin Dosing Weight: 86.8 kg  Vital Signs: Temp: 98.6 F (37 C) (04/02 1606) Temp Source: Oral (04/02 1606) BP: 144/79 (04/02 1606) Pulse Rate: 94 (04/02 1606)  Labs: Recent Labs    12/31/20 2002 01/01/21 0132 01/01/21 1031 01/02/21 0128 01/03/21 0134  HGB  --  10.1*  --  10.6*  --   HCT  --  33.1*  --  34.1*  --   PLT  --  257  --  321  --   LABPROT  --  13.6  --  16.7* 22.0*  INR  --  1.1  --  1.4* 2.0*  HEPARINUNFRC 0.28* 0.28* 0.40  --   --   CREATININE  --  1.92*  --  1.97*  --     Estimated Creatinine Clearance: 48.4 mL/min (A) (by C-G formula based on SCr of 1.97 mg/dL (H)).  Assessment: 45 y.o. female with LLE ischemia s/p lysis on lovenox and warfarin (Day 3 of overlap).   INR now  up to 2.0 this morning patient continues on sq enoxaparin with another 24 hours of overlap planned. Will back down on dosing   Goal of Therapy:  Heparin level 0.3-0.7 units/ml Monitor platelets by anticoagulation protocol: Yes   Plan:  -Coumadin '5mg'$  tonight -Continue lovenox '120mg'$  sq q12h - If INR >2 in am, can stop and patient would not need bridge.  Erin Hearing PharmD., BCPS Clinical Pharmacist 01/03/2021 6:00 PM

## 2021-01-04 LAB — GLUCOSE, CAPILLARY
Glucose-Capillary: 211 mg/dL — ABNORMAL HIGH (ref 70–99)
Glucose-Capillary: 188 mg/dL — ABNORMAL HIGH (ref 70–99)
Glucose-Capillary: 129 mg/dL — ABNORMAL HIGH (ref 70–99)
Glucose-Capillary: 109 mg/dL — ABNORMAL HIGH (ref 70–99)

## 2021-01-04 LAB — CBC
HCT: 33.9 % — ABNORMAL LOW (ref 36.0–46.0)
Hemoglobin: 10.4 g/dL — ABNORMAL LOW (ref 12.0–15.0)
MCH: 25.7 pg — ABNORMAL LOW (ref 26.0–34.0)
MCHC: 30.7 g/dL (ref 30.0–36.0)
MCV: 83.9 fL (ref 80.0–100.0)
Platelets: 399 10*3/uL (ref 150–400)
RBC: 4.04 MIL/uL (ref 3.87–5.11)
RDW: 15.9 % — ABNORMAL HIGH (ref 11.5–15.5)
WBC: 9.7 10*3/uL (ref 4.0–10.5)
nRBC: 0 % (ref 0.0–0.2)

## 2021-01-04 LAB — PROTIME-INR
INR: 2.3 — ABNORMAL HIGH (ref 0.8–1.2)
Prothrombin Time: 24.8 seconds — ABNORMAL HIGH (ref 11.4–15.2)

## 2021-01-04 MED ORDER — WARFARIN SODIUM 5 MG PO TABS
5.0000 mg | ORAL_TABLET | Freq: Once | ORAL | Status: AC
  Administered 2021-01-04: 5 mg via ORAL
  Filled 2021-01-04: qty 1

## 2021-01-04 NOTE — Progress Notes (Signed)
[place] for enoxaparin>warfarin Indication: DVT and inschemic limb  Allergies  Allergen Reactions  . Ciprofloxacin Hcl Hives  . Dilaudid [Hydromorphone Hcl] Shortness Of Breath and Other (See Comments)    "Asystole," per pt report  . Macrobid [Nitrofurantoin Macrocrystal] Hives, Shortness Of Breath and Rash  . Other Hives, Shortness Of Breath and Rash    NO "-CILLINS"!!!  . Penicillins Shortness Of Breath    Had had cephalosporins without incident Has patient had a PCN reaction causing immediate rash, facial/tongue/throat swelling, SOB or lightheadedness with hypotension: Yes Has patient had a PCN reaction causing severe rash involving mucus membranes or skin necrosis: Unk Has patient had a PCN reaction that required hospitalization: Unk Has patient had a PCN reaction occurring within the last 10 years: No If all of the above answers are "NO", then may proceed with Cephalosporin use.   . Sulfa Antibiotics Hives, Shortness Of Breath and Rash  . Lexapro [Escitalopram Oxalate] Other (See Comments)    "I just did not like it."    Patient Measurements: Height: '5\' 7"'$  (170.2 cm) Weight: 120.4 kg (265 lb 6.9 oz) IBW/kg (Calculated) : 61.6 Heparin Dosing Weight: 86.8 kg  Vital Signs: Temp: 98.3 F (36.8 C) (04/02 2226) Temp Source: Oral (04/02 2226) BP: 152/84 (04/02 2226) Pulse Rate: 85 (04/02 2226)  Labs: Recent Labs    01/01/21 1031 01/02/21 0128 01/03/21 0134 01/04/21 0155  HGB  --  10.6*  --  10.4*  HCT  --  34.1*  --  33.9*  PLT  --  321  --  399  LABPROT  --  16.7* 22.0* 24.8*  INR  --  1.4* 2.0* 2.3*  HEPARINUNFRC 0.40  --   --   --   CREATININE  --  1.97*  --   --     Estimated Creatinine Clearance: 48.4 mL/min (A) (by C-G formula based on SCr of 1.97 mg/dL (H)).  Assessment: 45 y.o. female with LLE ischemia s/p lysis on lovenox and warfarin (Day 4 of overlap).   INR now up to 2.3, after getting 5-'10mg'$  daily. Patient  has been on warfarin for 5 days, and therapeutic for 2 days.   Goal of Therapy:  Heparin level 0.3-0.7 units/ml Monitor platelets by anticoagulation protocol: Yes   Plan:  -Coumadin '5mg'$  tonight -May stop lovenox - will confirm with vascular surgery before discontinuing  Norina Buzzard, PharmD PGY1 Pharmacy Resident 01/04/2021 7:36 AM

## 2021-01-04 NOTE — TOC Initial Note (Signed)
Transition of Care [employer]) - Initial/Assessment Note    Patient Details  Name: Terri Sparks MRN: [id number] Date of Birth: 05-Jun-1976  [facility] CM/SW Contact:    Curlene Labrum, RN Phone Number: 01/04/2021, 11:14 AM  Clinical Narrative:                 Case management spoke with the patient over the phone regarding transitions of care - discharge planned for home on 01/05/2021.  Medication assistance to be provided through [place] [facility] with medications delivered to the hospital room prior to discharge.  The patient states that she is discharging home to her daughter's home, Terri Sparks - [phone].  The patient is planning on discharging home by car tomorrow with transportation provided through her aunt, Terri Sparks [phone].  CM and MSW will continue to follow for transition of care needs for home - pending discharge tomorrow, 01/05/2021 per patient.  Expected Discharge Plan: Home/Self Care Barriers to Discharge: Continued Medical Work up   Patient Goals and CMS Choice Patient states their goals for this hospitalization and ongoing recovery are:: Patient states that she is ready to feel better and discharge home in the morning, 01/05/2021. CMS Medicare.gov Compare Post Acute Care list provided to:: Patient Choice offered to / list presented to : Patient  Expected Discharge Plan and Services Expected Discharge Plan: Home/Self Care In-house Referral: PCP / [facility] Connect Discharge Planning Services: CM Consult,Medication Assistance,Follow-up appt scheduled (discharge meds through [place])   Living arrangements for the past 2 months: Single Family Home                                      Prior Living Arrangements/Services Living arrangements for the past 2 months: Single Family Home Lives with:: Self Patient language and need for interpreter reviewed:: Yes Do you feel safe going back to the place where you live?: Yes      Need for  Family Participation in Patient Care: Yes (Comment) Care giver support system in place?: Yes (comment)   Criminal Activity/Legal Involvement Pertinent to Current Situation/Hospitalization: No - Comment as needed  Activities of Daily Living      Permission Sought/Granted Permission sought to share information with : Case Manager Permission granted to share information with : Yes, Verbal Permission Granted        Permission granted to share info w Relationship: daughter, Terri Sparks - [phone], aunt, Terri Sparks [phone]     Emotional Assessment Appearance:: Appears stated age Attitude/Demeanor/Rapport: Engaged Affect (typically observed): Accepting Orientation: : Oriented to Self,Oriented to Place,Oriented to  Time,Oriented to Situation Alcohol / Substance Use: Not Applicable Psych Involvement: No (comment)  Admission diagnosis:  Lower limb ischemia [I99.8] Critical limb ischemia with history of revascularization of same extremity ([place]) [id number], Z98.890] Patient Active Problem List   Diagnosis Date Noted  . Critical limb ischemia with history of revascularization of same extremity ([place]) 12/27/2020  . Cocaine use disorder, moderate, dependence ([place]) 07/16/2020  . Cannabis use disorder, moderate, dependence ([place]) 07/16/2020  . Homeless 11/13/2019  . Chest pain at rest 09/24/2019  . Sinus tachycardia 09/24/2019  . Polysubstance abuse ([place]) 09/23/2019  . Atypical chest pain   . Stage 3b chronic kidney disease ([place]) 07/30/2019  . Iron deficiency anemia due to chronic blood loss 06/12/2019  . Vitamin B12 deficiency 06/12/2019  . Folate deficiency 06/12/2019  . Constipation 06/12/2019  . Prolonged QT  interval   . Ureteral stone with hydronephrosis 06/11/2019  . Acute renal failure superimposed on stage 3 chronic kidney disease ([place]) 06/26/2018  . Right ureteral stone 06/26/2018  . Essential hypertension 06/26/2018  . Subtherapeutic international normalized ratio (INR) 06/26/2018   . Adrenal nodule ([place]) 06/21/2018  . Lung nodules 06/21/2018  . Abnormal uterine bleeding (AUB) 05/14/2018  . Ischemia of extremity 05/12/2018  . Former smoker 03/14/2018  . Thrombosis of left iliac artery ([place]) 02/25/2018  . Thromboembolism ([place]) 02/25/2018  . Microalbuminuria 09/29/2017  . Insomnia 09/29/2017  . Immunization due 08/01/2017  . Anxiety and depression 04/28/2017  . 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 Iliac artery occlusion, left ([place]) 02/25/2017  . Tobacco abuse 02/25/2017  . Insulin-requiring or dependent type II diabetes mellitus ([place]) 02/25/2017  . Peripheral vascular disease of lower extremity ([place]) 02/24/2017  . GERD (gastroesophageal reflux disease)   . Hyperparathyroidism   . Anxiety state    PCP:  Ladell Pier, MD Pharmacy:   [facility]- [place], [place] - [address] [place] [address] Phone: [phone] Fax: [phone]  [place], [place] [address] [place] [address] Phone: [phone] Fax: [place], [address] [place] [address] Phone: [phone] Fax: [place], [place] - [address] [place] [address] Phone: [phone] Fax: [phone]     Social Determinants of Health (SDOH) Interventions    Readmission Risk Interventions Readmission Risk Prevention Plan 01/04/2021 05/19/2018  Transportation Screening Complete Complete  Home Care Screening - Complete  HRI or [place] - Complete  Medication Review ([place]) Complete Complete  PCP or Specialist appointment within 3-5 days of discharge Complete -  [place] or Home Care Consult Complete -  SW Recovery Care/Counseling Consult Complete -  Palliative Care Screening Complete -   [place] Not Applicable -  Some recent data might be hidden

## 2021-01-04 NOTE — Progress Notes (Addendum)
  Progress Note    01/04/2021 8:22 AM 6 Days Post-Op  Subjective:  LLE edema but no rest pain L foot   Vitals:   01/03/21 2226 01/04/21 0742  BP: (!) 152/84 130/84  Pulse: 85 93  Resp: 15   Temp: 98.3 F (36.8 C) 98.5 F (36.9 C)  SpO2: 99% 96%   Physical Exam: Lungs:  Non labored Incisions:  R groin soft Extremities:  Brisk L DP by doppler Neurologic: A&O  CBC    Component Value Date/Time   WBC 9.7 01/04/2021 0155   RBC 4.04 01/04/2021 0155   HGB 10.4 (L) 01/04/2021 0155   HGB 14.6 01/21/2020 1603   HCT 33.9 (L) 01/04/2021 0155   HCT 44.1 01/21/2020 1603   PLT 399 01/04/2021 0155   PLT 490 (H) 01/27/2018 1157   MCV 83.9 01/04/2021 0155   MCV 89 01/21/2020 1603   MCH 25.7 (L) 01/04/2021 0155   MCHC 30.7 01/04/2021 0155   RDW 15.9 (H) 01/04/2021 0155   RDW 13.5 01/21/2020 1603   LYMPHSABS 3.0 12/27/2020 1113   LYMPHSABS 3.1 01/21/2020 1603   MONOABS 0.8 12/27/2020 1113   EOSABS 0.4 12/27/2020 1113   EOSABS 0.4 01/21/2020 1603   BASOSABS 0.1 12/27/2020 1113   BASOSABS 0.1 01/21/2020 1603    BMET    Component Value Date/Time   NA 131 (L) 01/02/2021 0128   NA 135 07/29/2020 1528   K 4.6 01/02/2021 0128   CL 101 01/02/2021 0128   CO2 23 01/02/2021 0128   GLUCOSE 287 (H) 01/02/2021 0128   BUN 14 01/02/2021 0128   BUN 19 07/29/2020 1528   CREATININE 1.97 (H) 01/02/2021 0128   CREATININE 1.88 (H) 02/19/2020 1555   CALCIUM 9.2 01/02/2021 0128   GFRNONAA 31 (L) 01/02/2021 0128   GFRNONAA 32 (L) 02/19/2020 1555   GFRAA 30 (L) 07/29/2020 1528   GFRAA 37 (L) 02/19/2020 1555    INR    Component Value Date/Time   INR 2.3 (H) 01/04/2021 0155     Intake/Output Summary (Last 24 hours) at 01/04/2021 [id number] Last data filed at 01/03/2021 1200 Gross per 24 hour  Intake 391.25 ml  Output --  Net 391.25 ml     Assessment/Plan:  45 y.o. female is s/p LLE revascularization 6 Days Post-Op   - L foot well perfused with brisk DP signal by doppler.  Patient  however is concerned about discharge to daughter's today because there will be 3 grandchildren and 3 dogs and she fears re-admission - INR 2.3 today; ok to stop lovenox - Dr. Donzetta Matters will re-evaluate patient in the morning   Dagoberto Ligas, PA-C [facility] [phone] 01/04/2021 8:22 AM  I have seen and evaluated the patient. I agree with the PA note as documented above.  45 year old female status post thrombolysis of left lower extremity.  She has a nice warm foot today and good signal on the top of the foot with Doppler.  INR is 2.3.  Continue coumadin.  Plan was discharge today but she does not feel ready for discharge and wants 1 more day.  I think she is very fearful of readmission and there are some outstanding social issues.  Marty Heck, MD [facility] [place] Office: [phone]

## 2021-01-05 ENCOUNTER — Other Ambulatory Visit (HOSPITAL_COMMUNITY): Payer: Self-pay

## 2021-01-05 LAB — PROTIME-INR
INR: 2.2 — ABNORMAL HIGH (ref 0.8–1.2)
Prothrombin Time: 23.5 seconds — ABNORMAL HIGH (ref 11.4–15.2)

## 2021-01-05 LAB — GLUCOSE, CAPILLARY
Glucose-Capillary: 206 mg/dL — ABNORMAL HIGH (ref 70–99)
Glucose-Capillary: 229 mg/dL — ABNORMAL HIGH (ref 70–99)
Glucose-Capillary: 181 mg/dL — ABNORMAL HIGH (ref 70–99)
Glucose-Capillary: 204 mg/dL — ABNORMAL HIGH (ref 70–99)

## 2021-01-05 MED ORDER — WARFARIN SODIUM 10 MG PO TABS
10.0000 mg | ORAL_TABLET | Freq: Once | ORAL | Status: AC
  Administered 2021-01-05: 10 mg via ORAL
  Filled 2021-01-05: qty 1

## 2021-01-05 MED ORDER — OXYCODONE-ACETAMINOPHEN 5-325 MG PO TABS: 1.0000 | ORAL_TABLET | Freq: Four times a day (QID) | ORAL | 0 refills | Status: DC | PRN

## 2021-01-05 NOTE — Progress Notes (Signed)
[place] for enoxaparin>warfarin Indication: DVT and inschemic limb  Allergies  Allergen Reactions  . Ciprofloxacin Hcl Hives  . Dilaudid [Hydromorphone Hcl] Shortness Of Breath and Other (See Comments)    "Asystole," per pt report  . Macrobid [Nitrofurantoin Macrocrystal] Hives, Shortness Of Breath and Rash  . Other Hives, Shortness Of Breath and Rash    NO "-CILLINS"!!!  . Penicillins Shortness Of Breath    Had had cephalosporins without incident Has patient had a PCN reaction causing immediate rash, facial/tongue/throat swelling, SOB or lightheadedness with hypotension: Yes Has patient had a PCN reaction causing severe rash involving mucus membranes or skin necrosis: Unk Has patient had a PCN reaction that required hospitalization: Unk Has patient had a PCN reaction occurring within the last 10 years: No If all of the above answers are "NO", then may proceed with Cephalosporin use.   . Sulfa Antibiotics Hives, Shortness Of Breath and Rash  . Lexapro [Escitalopram Oxalate] Other (See Comments)    "I just did not like it."    Patient Measurements: Height: '5\' 7"'$  (170.2 cm) Weight: 120.4 kg (265 lb 6.9 oz) IBW/kg (Calculated) : 61.6 Heparin Dosing Weight: 86.8 kg  Vital Signs: Temp: 98.1 F (36.7 C) (04/04 0732) Temp Source: Oral (04/04 0732) BP: 163/80 (04/04 0732) Pulse Rate: 88 (04/04 0732)  Labs: Recent Labs    01/03/21 0134 01/04/21 0155 01/05/21 0451  HGB  --  10.4*  --   HCT  --  33.9*  --   PLT  --  399  --   LABPROT 22.0* 24.8* 23.5*  INR 2.0* 2.3* 2.2*    Estimated Creatinine Clearance: 48.4 mL/min (A) (by C-G formula based on SCr of 1.97 mg/dL (H)).  Assessment: 45 y.o. female with LLE ischemia s/p lysis on lovenox and warfarin. INR 2.2 today and therapeutic, will discontinue enoxaparin bridge.  *Home dose = '10mg'$  MWF, '5mg'$  all other days  Goal of Therapy:  Heparin level 0.3-0.7 units/ml Monitor platelets by  anticoagulation protocol: Yes   Plan:  -Warfarin '10mg'$  x1 tonight -Stop enoxaparin -Daily INR while admitted   Arrie Senate, PharmD, BCPS, [facility] Clinical Pharmacist [phone] Please check AMION for all [place] numbers 01/05/2021

## 2021-01-05 NOTE — Progress Notes (Addendum)
  Progress Note    01/05/2021 7:38 AM 7 Days Post-Op  Subjective:  L lateral ankle pain   Vitals:   01/05/21 0322 01/05/21 0732  BP: (!) 107/49 (!) 163/80  Pulse: 64 88  Resp: 18 19  Temp: 98.3 F (36.8 C) 98.1 F (36.7 C)  SpO2: 96% 100%   Physical Exam: Lungs:  Non labored Incisions:  R groin soft Extremities:  Brisk L DP pulse Neurologic: A&O  CBC    Component Value Date/Time   WBC 9.7 01/04/2021 0155   RBC 4.04 01/04/2021 0155   HGB 10.4 (L) 01/04/2021 0155   HGB 14.6 01/21/2020 1603   HCT 33.9 (L) 01/04/2021 0155   HCT 44.1 01/21/2020 1603   PLT 399 01/04/2021 0155   PLT 490 (H) 01/27/2018 1157   MCV 83.9 01/04/2021 0155   MCV 89 01/21/2020 1603   MCH 25.7 (L) 01/04/2021 0155   MCHC 30.7 01/04/2021 0155   RDW 15.9 (H) 01/04/2021 0155   RDW 13.5 01/21/2020 1603   LYMPHSABS 3.0 12/27/2020 1113   LYMPHSABS 3.1 01/21/2020 1603   MONOABS 0.8 12/27/2020 1113   EOSABS 0.4 12/27/2020 1113   EOSABS 0.4 01/21/2020 1603   BASOSABS 0.1 12/27/2020 1113   BASOSABS 0.1 01/21/2020 1603    BMET    Component Value Date/Time   NA 131 (L) 01/02/2021 0128   NA 135 07/29/2020 1528   K 4.6 01/02/2021 0128   CL 101 01/02/2021 0128   CO2 23 01/02/2021 0128   GLUCOSE 287 (H) 01/02/2021 0128   BUN 14 01/02/2021 0128   BUN 19 07/29/2020 1528   CREATININE 1.97 (H) 01/02/2021 0128   CREATININE 1.88 (H) 02/19/2020 1555   CALCIUM 9.2 01/02/2021 0128   GFRNONAA 31 (L) 01/02/2021 0128   GFRNONAA 32 (L) 02/19/2020 1555   GFRAA 30 (L) 07/29/2020 1528   GFRAA 37 (L) 02/19/2020 1555    INR    Component Value Date/Time   INR 2.2 (H) 01/05/2021 0451    No intake or output data in the 24 hours ending 01/05/21 [id number]   Assessment/Plan:  45 y.o. female is s/p LLE revascularization  7 Days Post-Op   L LE well perfused with brisk DP by doppler INR 2.2, pharmacy dosing coumadin Ready for discharge from vascular standpoint but seems to be social barriers   Dagoberto Ligas,  PA-C [facility] [phone] 01/05/2021 7:38 AM   I have independently interviewed and examined patient and agree with PA assessment and plan above.  Currently limited by social issues and pain.  We are continuing with p.o. pain control and ambulating as tolerated and hopefully home early this week.  Ellawyn Wogan C. Donzetta Matters, MD [facility] [place] Office: [phone] Pager: [phone]

## 2021-01-06 ENCOUNTER — Other Ambulatory Visit (HOSPITAL_COMMUNITY): Payer: Self-pay

## 2021-01-06 LAB — GLUCOSE, CAPILLARY
Glucose-Capillary: 280 mg/dL — ABNORMAL HIGH (ref 70–99)
Glucose-Capillary: 218 mg/dL — ABNORMAL HIGH (ref 70–99)

## 2021-01-06 LAB — PROTIME-INR
INR: 2 — ABNORMAL HIGH (ref 0.8–1.2)
Prothrombin Time: 21.7 seconds — ABNORMAL HIGH (ref 11.4–15.2)

## 2021-01-06 MED ORDER — OXYCODONE-ACETAMINOPHEN 5-325 MG PO TABS
1.0000 | ORAL_TABLET | Freq: Four times a day (QID) | ORAL | 0 refills | Status: DC | PRN
  Filled 2021-01-06: qty 20, 5d supply, fill #0

## 2021-01-06 MED ORDER — WARFARIN SODIUM 5 MG PO TABS: 5.0000 mg | ORAL_TABLET | Freq: Once | ORAL | Status: DC

## 2021-01-06 NOTE — Progress Notes (Signed)
[place] for enoxaparin>warfarin Indication: DVT and inschemic limb  Allergies  Allergen Reactions  . Ciprofloxacin Hcl Hives  . Dilaudid [Hydromorphone Hcl] Shortness Of Breath and Other (See Comments)    "Asystole," per pt report  . Macrobid [Nitrofurantoin Macrocrystal] Hives, Shortness Of Breath and Rash  . Other Hives, Shortness Of Breath and Rash    NO "-CILLINS"!!!  . Penicillins Shortness Of Breath    Had had cephalosporins without incident Has patient had a PCN reaction causing immediate rash, facial/tongue/throat swelling, SOB or lightheadedness with hypotension: Yes Has patient had a PCN reaction causing severe rash involving mucus membranes or skin necrosis: Unk Has patient had a PCN reaction that required hospitalization: Unk Has patient had a PCN reaction occurring within the last 10 years: No If all of the above answers are "NO", then may proceed with Cephalosporin use.   . Sulfa Antibiotics Hives, Shortness Of Breath and Rash  . Lexapro [Escitalopram Oxalate] Other (See Comments)    "I just did not like it."    Patient Measurements: Height: '5\' 7"'$  (170.2 cm) Weight: 120.4 kg (265 lb 6.9 oz) IBW/kg (Calculated) : 61.6 Heparin Dosing Weight: 86.8 kg  Vital Signs: Temp: 97.8 F (36.6 C) (04/05 0323) Temp Source: Oral (04/05 0323) BP: 132/77 (04/05 0323) Pulse Rate: 93 (04/05 0323)  Labs: Recent Labs    01/04/21 0155 01/05/21 0451 01/06/21 0220  HGB 10.4*  --   --   HCT 33.9*  --   --   PLT 399  --   --   LABPROT 24.8* 23.5* 21.7*  INR 2.3* 2.2* 2.0*    Estimated Creatinine Clearance: 48.4 mL/min (A) (by C-G formula based on SCr of 1.97 mg/dL (H)).  Assessment: 45 y.o. female with LLE ischemia s/p lysis on lovenox and warfarin. INR 2.0 today and therapeutic.  *Home dose = '10mg'$  MWF, '5mg'$  all other days  Goal of Therapy:  Heparin level 0.3-0.7 units/ml Monitor platelets by anticoagulation protocol: Yes    Plan:  -Warfarin '5mg'$  x1 tonight -Daily INR while admitted   Arrie Senate, PharmD, BCPS, [facility] Pharmacist [phone] Please check AMION for all [place] numbers 01/06/2021

## 2021-01-06 NOTE — Progress Notes (Addendum)
  Progress Note    01/06/2021 7:34 AM 8 Days Post-Op  Subjective:  Says she's having some pain around the lateral portion of the left leg above the ankle.  Says swelling in her foot is better.  She has another [place] due 4/23.  afebrile  Vitals:   01/06/21 0005 01/06/21 0323  BP: 119/77 132/77  Pulse: 89 93  Resp: 20 13  Temp: 97.7 F (36.5 C) 97.8 F (36.6 C)  SpO2: 97% 98%    Physical Exam: Cardiac:  regular Lungs:  Non labored Extremities:  Brisk left DP and peroneal doppler signals.  Left femoral pulse is palpable.  Some erythema left foot dorsum area.  Mild swelling left foot.   CBC    Component Value Date/Time   WBC 9.7 01/04/2021 0155   RBC 4.04 01/04/2021 0155   HGB 10.4 (L) 01/04/2021 0155   HGB 14.6 01/21/2020 1603   HCT 33.9 (L) 01/04/2021 0155   HCT 44.1 01/21/2020 1603   PLT 399 01/04/2021 0155   PLT 490 (H) 01/27/2018 1157   MCV 83.9 01/04/2021 0155   MCV 89 01/21/2020 1603   MCH 25.7 (L) 01/04/2021 0155   MCHC 30.7 01/04/2021 0155   RDW 15.9 (H) 01/04/2021 0155   RDW 13.5 01/21/2020 1603   LYMPHSABS 3.0 12/27/2020 1113   LYMPHSABS 3.1 01/21/2020 1603   MONOABS 0.8 12/27/2020 1113   EOSABS 0.4 12/27/2020 1113   EOSABS 0.4 01/21/2020 1603   BASOSABS 0.1 12/27/2020 1113   BASOSABS 0.1 01/21/2020 1603    BMET    Component Value Date/Time   NA 131 (L) 01/02/2021 0128   NA 135 07/29/2020 1528   K 4.6 01/02/2021 0128   CL 101 01/02/2021 0128   CO2 23 01/02/2021 0128   GLUCOSE 287 (H) 01/02/2021 0128   BUN 14 01/02/2021 0128   BUN 19 07/29/2020 1528   CREATININE 1.97 (H) 01/02/2021 0128   CREATININE 1.88 (H) 02/19/2020 1555   CALCIUM 9.2 01/02/2021 0128   GFRNONAA 31 (L) 01/02/2021 0128   GFRNONAA 32 (L) 02/19/2020 1555   GFRAA 30 (L) 07/29/2020 1528   GFRAA 37 (L) 02/19/2020 1555    INR    Component Value Date/Time   INR 2.0 (H) 01/06/2021 0220     Intake/Output Summary (Last 24 hours) at 01/06/2021 0734 Last data filed at  01/05/2021 2024 Gross per 24 hour  Intake 490 ml  Output --  Net 490 ml     Assessment:  45 y.o. female is s/p:  Mechanical thrombectomy with penumbra cat 6 of popliteal, tibioperoneal trunk, peroneal and anterior tibial arteries and cat 3 mechanical thrombectomy of anterior tibial and peroneal arteries  8 Days Post-Op  Plan: -pt with brisk doppler signals left foot. -INR 2.0 today after coumadin '10mg'$  last evening.  -continue pain control.  D/c once controlled. -appreciate TOC for d/c needs.  [place] pharmacy meds to be delivered to room prior to dc.  -PDMP reviewed.  She had Oxycodone sent to pharmacy on 4/1.  Discussed with Bryon Lions, PA and he cancelled that and the rx that was sent to her outpatient pharmacy b/c she wanted Percocet instead of Oxycodone.  Will send Percocet rx to TOC.   Leontine Locket, PA-C [facility] [phone] 01/06/2021 7:34 AM   I have independently interviewed and examined patient and agree with PA assessment and plan above.   Jianni Shelden C. Donzetta Matters, MD [facility] [place] Office: [phone] Pager: [phone]

## 2021-01-06 NOTE — Progress Notes (Signed)
Inpatient Diabetes Program Recommendations  AACE/ADA: New Consensus Statement on Inpatient Glycemic Control (2015)  Target Ranges:  Prepandial:   less than 140 mg/dL      Peak postprandial:   less than 180 mg/dL (1-2 hours)      Critically ill patients:  140 - 180 mg/dL   Lab Results  Component Value Date   GLUCAP 280 (H) 01/06/2021   HGBA1C 13.5 (H) 12/27/2020    Review of Glycemic Control Results for Terri Sparks, Terri Sparks (MRN [id number]) as of 01/06/2021 10:59  Ref. Range 01/05/2021 06:09 01/05/2021 10:54 01/05/2021 16:03 01/05/2021 20:51 01/06/2021 06:01  Glucose-Capillary Latest Ref Range: 70 - 99 mg/dL 206 (H) 229 (H) 181 (H) 204 (H) 280 (H)    HgbA1C - 13.5%  Current orders for Inpatient glycemic control: Lantus 20 units QD, Novolog 0-15 units TID with meals and 0-5 HS  Inpatient Diabetes Program Recommendations:     Increase Lantus to 25 units QD   Thanks, Tama Headings RN, MSN, BC-ADM Inpatient Diabetes Coordinator Team Pager [phone] (8a-5p)

## 2021-01-07 ENCOUNTER — Telehealth: Payer: Self-pay | Admitting: *Deleted

## 2021-01-07 ENCOUNTER — Other Ambulatory Visit (HOSPITAL_COMMUNITY): Payer: Self-pay | Admitting: Internal Medicine

## 2021-01-07 ENCOUNTER — Other Ambulatory Visit: Payer: Self-pay | Admitting: Internal Medicine

## 2021-01-07 ENCOUNTER — Other Ambulatory Visit: Payer: Self-pay

## 2021-01-07 DIAGNOSIS — F339 Major depressive disorder, recurrent, unspecified: Secondary | ICD-10-CM

## 2021-01-07 DIAGNOSIS — I10 Essential (primary) hypertension: Secondary | ICD-10-CM

## 2021-01-07 MED FILL — Gabapentin Cap 400 MG: ORAL | 30 days supply | Qty: 90 | Fill #0 | Status: CN

## 2021-01-07 MED FILL — Albuterol Sulfate Inhal Aero 108 MCG/ACT (90MCG Base Equiv): RESPIRATORY_TRACT | 25 days supply | Qty: 8.5 | Fill #0 | Status: CN

## 2021-01-07 NOTE — Telephone Encounter (Signed)
Transition Care Management Unsuccessful Follow-up Telephone Call  Date of discharge and from where:  01/06/2021 - [facility]  Attempts:  1st Attempt  Reason for unsuccessful TCM follow-up call:  Unable to leave message

## 2021-01-07 NOTE — Telephone Encounter (Signed)
Requested medication (s) are due for refill today: no  Requested medication (s) are on the active medication list: yes   Future visit scheduled no  Notes to clinic:Patient is due for follow up appointment Lmtcb   Requested Prescriptions  Pending Prescriptions Disp Refills   amLODipine (NORVASC) 5 MG tablet 30 tablet 11    Sig: Take 1 tablet (5 mg total) by mouth daily.      Cardiovascular:  Calcium Channel Blockers Passed - 01/07/2021  9:23 AM      Passed - Last BP in normal range    BP Readings from Last 1 Encounters:  01/06/21 125/78          Passed - Valid encounter within last 6 months    Recent Outpatient Visits           5 months ago Preoperative evaluation to rule out surgical contraindication   [employer] Karle Plumber B, MD   6 months ago [facility]embolism [facility])   [place], Annie Main L, RPH-CPP   9 months ago Type 2 diabetes mellitus with microalbuminuria, with long-term current use of insulin [facility])   [place] Karle Plumber B, MD   11 months ago Preoperative evaluation to rule out surgical contraindication   [place] Ladell Pier, MD   1 year ago 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      Gastroenterology: Antiacids Passed - 01/07/2021  9:23 AM      Passed - Valid encounter within last 12 months    Recent Outpatient Visits           5 months ago Preoperative evaluation to rule out surgical contraindication   [place] Ladell Pier, MD   6 months ago Thromboembolism [facility])   [place], Jarome Matin, RPH-CPP   9 months ago Type 2 diabetes mellitus with microalbuminuria, with long-term current use of insulin [facility])   [place] Ladell Pier, MD   11 months ago Preoperative evaluation to rule out surgical contraindication   [place], Deborah B, MD   1 year ago 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 [facility])   [place] Karle Plumber B, MD                  glucose blood (ACCU-CHEK GUIDE) test strip 100 strip 2    Sig: USE AS INSTRUCTED TO [place]      Endocrinology: Diabetes - Testing Supplies Passed - 01/07/2021  9:23 AM      Passed - Valid encounter within last 12 months    Recent Outpatient Visits           5 months ago Preoperative evaluation to rule out surgical contraindication   [place], MD   6 months ago Thromboembolism [facility])   [place],  Jarome Matin, RPH-CPP   9 months ago Type 2 diabetes mellitus with microalbuminuria, with long-term current use of insulin [facility])   [place] Ladell Pier, MD   11 months ago Preoperative evaluation to rule out surgical contraindication   [place] Ladell Pier, MD   1 year ago 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

## 2021-01-07 NOTE — Telephone Encounter (Signed)
   Notes to clinic:  medication was filled by a different provider  Review for refill    Requested Prescriptions  Pending Prescriptions Disp Refills   buPROPion (WELLBUTRIN SR) 150 MG 12 hr tablet 60 tablet 1    Sig: Take 1 tablet (150 mg total) by mouth 2 (two) times daily.      There is no refill protocol information for this order

## 2021-01-08 ENCOUNTER — Encounter: Payer: Self-pay | Admitting: Critical Care Medicine

## 2021-01-08 ENCOUNTER — Other Ambulatory Visit: Payer: Self-pay

## 2021-01-08 ENCOUNTER — Ambulatory Visit: Payer: Medicaid Other | Attending: Internal Medicine | Admitting: Critical Care Medicine

## 2021-01-08 DIAGNOSIS — F339 Major depressive disorder, recurrent, unspecified: Secondary | ICD-10-CM | POA: Diagnosis not present

## 2021-01-08 DIAGNOSIS — E1169 Type 2 diabetes mellitus with other specified complication: Secondary | ICD-10-CM

## 2021-01-08 DIAGNOSIS — I10 Essential (primary) hypertension: Secondary | ICD-10-CM | POA: Diagnosis not present

## 2021-01-08 DIAGNOSIS — F419 Anxiety disorder, unspecified: Secondary | ICD-10-CM

## 2021-01-08 DIAGNOSIS — F1420 Cocaine dependence, uncomplicated: Secondary | ICD-10-CM

## 2021-01-08 DIAGNOSIS — I70229 Atherosclerosis of native arteries of extremities with rest pain, unspecified extremity: Secondary | ICD-10-CM

## 2021-01-08 DIAGNOSIS — Z720 Tobacco use: Secondary | ICD-10-CM | POA: Diagnosis not present

## 2021-01-08 DIAGNOSIS — I745 Embolism and thrombosis of iliac artery: Secondary | ICD-10-CM | POA: Diagnosis not present

## 2021-01-08 DIAGNOSIS — E1129 Type 2 diabetes mellitus with other diabetic kidney complication: Secondary | ICD-10-CM | POA: Diagnosis not present

## 2021-01-08 DIAGNOSIS — Z794 Long term (current) use of insulin: Secondary | ICD-10-CM

## 2021-01-08 DIAGNOSIS — E785 Hyperlipidemia, unspecified: Secondary | ICD-10-CM | POA: Insufficient documentation

## 2021-01-08 DIAGNOSIS — Z9889 Other specified postprocedural states: Secondary | ICD-10-CM

## 2021-01-08 DIAGNOSIS — F32A Depression, unspecified: Secondary | ICD-10-CM

## 2021-01-08 DIAGNOSIS — N1832 Chronic kidney disease, stage 3b: Secondary | ICD-10-CM

## 2021-01-08 DIAGNOSIS — R809 Proteinuria, unspecified: Secondary | ICD-10-CM

## 2021-01-08 DIAGNOSIS — E119 Type 2 diabetes mellitus without complications: Secondary | ICD-10-CM

## 2021-01-08 DIAGNOSIS — G5792 Unspecified mononeuropathy of left lower limb: Secondary | ICD-10-CM

## 2021-01-08 MED ORDER — BUPROPION HCL ER (SR) 150 MG PO TB12
150.0000 mg | ORAL_TABLET | Freq: Two times a day (BID) | ORAL | 1 refills | Status: DC
Start: 2021-01-08 — End: 2021-02-02
  Filled 2021-01-08: qty 60, 30d supply, fill #0

## 2021-01-08 MED ORDER — AMLODIPINE BESYLATE 5 MG PO TABS
5.0000 mg | ORAL_TABLET | Freq: Every day | ORAL | 11 refills | Status: DC
  Filled 2021-01-08: qty 30, 30d supply, fill #0

## 2021-01-08 MED ORDER — GABAPENTIN 400 MG PO CAPS: ORAL_CAPSULE | ORAL | 4 refills | Status: DC

## 2021-01-08 MED ORDER — CARVEDILOL 3.125 MG PO TABS
3.1250 mg | ORAL_TABLET | Freq: Two times a day (BID) | ORAL | 6 refills | Status: DC
  Filled 2021-01-08: qty 60, 30d supply, fill #0

## 2021-01-08 MED ORDER — SERTRALINE HCL 100 MG PO TABS
100.0000 mg | ORAL_TABLET | Freq: Every day | ORAL | 3 refills | Status: DC
  Filled 2021-01-08: qty 30, 30d supply, fill #0

## 2021-01-08 MED ORDER — ALBUTEROL SULFATE HFA 108 (90 BASE) MCG/ACT IN AERS: 2.0000 | INHALATION_SPRAY | Freq: Four times a day (QID) | RESPIRATORY_TRACT | 2 refills | Status: DC | PRN

## 2021-01-08 MED ORDER — INSULIN GLARGINE 100 UNIT/ML ~~LOC~~ SOLN: 60.0000 [IU] | Freq: Every day | SUBCUTANEOUS | 6 refills | Status: DC

## 2021-01-08 MED ORDER — SUCRALFATE 1 GM/10ML PO SUSP
1.0000 g | Freq: Two times a day (BID) | ORAL | 0 refills | Status: DC
  Filled 2021-01-08: qty 420, 21d supply, fill #0

## 2021-01-08 MED ORDER — GLUCOSE BLOOD VI STRP
ORAL_STRIP | 2 refills | Status: DC
  Filled 2021-01-08: qty 100, 30d supply, fill #0

## 2021-01-08 NOTE — Telephone Encounter (Signed)
Transition Care Management Unsuccessful Follow-up Telephone Call  Date of discharge and from where:  01/06/2021 [facility]  Attempts:  2nd Attempt  Reason for unsuccessful TCM follow-up call:  Unable to leave message

## 2021-01-08 NOTE — Assessment & Plan Note (Signed)
Poorly controlled type 2 diabetes we will increase Lantus to 60 units and continue sliding scale insulin

## 2021-01-08 NOTE — Assessment & Plan Note (Signed)
Monitor renal function 

## 2021-01-08 NOTE — Assessment & Plan Note (Signed)
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

## 2021-01-08 NOTE — Assessment & Plan Note (Signed)
As per left iliac artery occlusion

## 2021-01-08 NOTE — Progress Notes (Signed)
Subjective:    Patient ID: Terri Sparks, female    DOB: 02/15/1976, 45 y.o.   MRN: [id number] Virtual Visit via Telephone Note  I connected with Terri Sparks on 01/08/21 at  9:30 AM EDT by telephone and verified that I am speaking with the correct person using two identifiers.   Consent:  I discussed the limitations, risks, security and privacy concerns of performing an evaluation and management service by telephone and the availability of in person appointments. I also discussed with the patient that there may be a patient responsible charge related to this service. The patient expressed understanding and agreed to proceed.  Location of patient: Patient was at home  Location of provider: I am in my office  Persons participating in the televisit with the patient.   No one else on the call  She was not able to hear me on the video system so I switched to the telephone system I could see her on video and she was no distress but only 5% of the visit was on video    History of Present Illness: 45 y.o.F  Post HFU  01/08/2021 This is a 45 year old primary care patient of Terri Sparks who is seen for post hospital follow-up.  Note this is a transition of care visit and an attempt was made to call the patient yesterday on 6 April but she had just been discharged from the hospital and was not able to be reached.  Today I initiated a call with the video system but the patient was not able to hear me on my audio due to some technical issues on my end therefore I completed this visit on the telephone with the patient but I was able to initially see the patient on video though it was only about 5% of the total time of this visit.  The patient was not in distress initially on the video that I saw. This patient has a history of lower extremity peripheral artery disease with her current clotting and presented with left lower extremity acute limb ischemia to the emergency room with subsequent  admission below is copy of the initial admission note and the last visit note.  The discharge summary is yet to be produced.  This patient has had difficulty being compliant with warfarin she states she gets very depressed despondent gets disengaged and stops the warfarin she also had been using cocaine as well as excess tobacco use now she states she is no longer using the cocaine and is vouch that she will try to take better care of herself.  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  Patient has longstanding history of diagnosed cocaine use, anxiety and depression. The patient does have upcoming visit with her mental health provider.  Patient also has type 2 diabetes and her insulin dose was dramatically reduced in the hospital.  She states since discharge of the last 24 hours she has had to give anywhere from 8 to 11 units every time she checks her blood sugar and has been running consistently above 280.  She was on 60+ units of Lantus previously before admission.  She needs refills on her test strips amlodipine Coreg gabapentin Carafate Wellbutrin Zoloft and needs an albuterol inhaler.  At the hospitalization discharge she was given a prescription for transition of care for aspirin atorvastatin hydroxyzine warfarin and a Percocet.  She  still having pain in the leg.  Below is a copy of the dictation from the admission in the last note prior to being discharged 45 y.o. female with rutherford class 1 acute limb ischemia of the left lower extremity.  Start heparin infusion. Admit to 4E. Check CTA A/P with runoff. Plan aortogram, left lower extremity angiogram, initiation of thrombolysis via left brachial artery approach in OR tomorrow.  I counseled her extensively about her need for lifetime adherence to anticoagulation medicine.  I explained to her that if she does not continue anticoagulation, she  will likely lose her leg.  She, of course, must stop smoking and doing drugs.  She likely needs some psychiatric help going forward.  CHIEF COMPLAINT: Left leg pain  HISTORY OF PRESENT ILLNESS: Terri Sparks is a 45 y.o. female very well-known to our service, who presents to [facility], ER with a painful left foot.  The pain is typical of previous episodes of acute limb ischemia she has suffered.  Her vascular history is detailed below.  She can move the foot.  She has sensation in the foot.  She reports not taking Coumadin for several days.  She reports she "forgets" and sometimes "gets depressed and does not care" about her anticoagulation.  She continues to smoke.  She says "this time things are going to be different" when she leaves.  VASCULAR SURGICAL HISTORY:  02/24/2017 - left lower extremity angiogram, stenting left common iliac artery (8 x 59 mm iCast), open thromboembolectomy via femoral, popliteal and posterior tibial exposures by Dr. Donzetta Matters.  11/14/2017 - left lower extremity angiogram via right common femoral access Dr. Donzetta Matters  02/25/2018 - redo open iliofemoral and popliteal/tibial embolectomy via left femoral exposure by Dr. Bridgett Larsson  03/03/2018 - exploration and debridement of infected left groin wound by Dr. Scot Dock  05/12/2018 - mechanical thrombectomy of left common iliac, left femoral, left popliteal, left anterior tibial artery with penumbra device by Drs. Bethena Midget  Assessment:  45 y.o. female is s/p:  Mechanical thrombectomy with penumbra cat 6 of popliteal, tibioperoneal trunk, peroneal and anterior tibial arteries andcat3 mechanical thrombectomy of anterior tibial and peroneal arteries  8 Days Post-Op  Plan: -pt with brisk doppler signals left foot. -INR 2.0 today after coumadin '10mg'$  last evening.  -continue pain control.  D/c once controlled. -appreciate TOC for d/c needs.  [facility] meds to be delivered to room prior to dc.  -PDMP reviewed.  She had  Oxycodone sent to pharmacy on 4/1.  Discussed with Terri Lions, PA and he cancelled that and the rx that was sent to her outpatient pharmacy b/c she wanted Percocet instead of Oxycodone.  Will send Percocet rx to TOC.    The patient does have follow-up visits established with her primary care provider and also with vascular surgery    Past Medical History:  Diagnosis Date  . Anxiety   . Depression   . Diabetes ([place])    Type 2  . GERD (gastroesophageal reflux disease)   . History of blood clots    ,DVT-left leg, and early 2000, had blood clot behind left breast  . History of kidney stones   . Hypercalcemia   . Hyperparathyroidism   . Hypertension    had been on Lisinopril and PCP took her off it 4 months ago  . Iron deficiency anemia   . Left thyroid nodule   . Mass of right ovary   . Nephrolithiasis   . PAOD (peripheral arterial occlusive disease) (  [place])   . Peripheral vascular disease ([place])   . Prolonged Q-T interval on ECG 04/19/2019  . Renal calculi   . Substance abuse ([place])   . Tooth loose    top tooth from the right  is loose      Family History  Problem Relation Sparks of Onset  . Depression Mother   . Diabetes Father   . Heart failure Father      Social History   Socioeconomic History  . Marital status: Single    Spouse name: Not on file  . Number of children: Not on file  . Years of education: Not on file  . Highest education level: Not on file  Occupational History  . Not on file  Tobacco Use  . Smoking status: Current Some Day Smoker    Packs/day: 0.50    Years: 15.00    Pack years: 7.50    Types: Cigarettes  . Smokeless tobacco: Never Used  Vaping Use  . Vaping Use: Never used  Substance and Sexual Activity  . Alcohol use: Yes    Comment: occassionally  . Drug use: Yes    Types: Marijuana, Cocaine    Comment: once daily- last time used was night of 02/28/2020  . Sexual activity: Yes    Partners: Male    Birth control/protection: Other-see comments     Comment: BTL  Other Topics Concern  . Not on file  Social History Narrative  . Not on file   Social Determinants of Health   Financial Resource Strain: Not on file  Food Insecurity: Not on file  Transportation Needs: Not on file  Physical Activity: Not on file  Stress: Not on file  Social Connections: Not on file  Intimate Partner Violence: Not on file     Allergies  Allergen Reactions  . Ciprofloxacin Hcl Hives  . Dilaudid [Hydromorphone Hcl] Shortness Of Breath and Other (See Comments)    "Asystole," per pt report  . Macrobid [Nitrofurantoin Macrocrystal] Hives, Shortness Of Breath and Rash  . Other Hives, Shortness Of Breath and Rash    NO "-CILLINS"!!!  . Penicillins Shortness Of Breath    Had had cephalosporins without incident Has patient had a PCN reaction causing immediate rash, facial/tongue/throat swelling, SOB or lightheadedness with hypotension: Yes Has patient had a PCN reaction causing severe rash involving mucus membranes or skin necrosis: Unk Has patient had a PCN reaction that required hospitalization: Unk Has patient had a PCN reaction occurring within the last 10 years: No If all of the above answers are "NO", then may proceed with Cephalosporin use.   . Sulfa Antibiotics Hives, Shortness Of Breath and Rash  . Lexapro [Escitalopram Oxalate] Other (See Comments)    "I just did not like it."     Outpatient Medications Prior to Visit  Medication Sig Dispense Refill  . acetaminophen (TYLENOL) 500 MG tablet Take 2,000 mg by mouth daily as needed (for headaches).     Marland Kitchen aspirin 81 MG EC tablet TAKE 1 TABLET (81 MG TOTAL) BY MOUTH DAILY. SWALLOW WHOLE. (Patient taking differently: Take by mouth 5 (five) times daily. SWALLOW WHOLE.) 90 tablet 3  . aspirin EC 81 MG EC tablet Take 1 tablet (81 mg total) by mouth daily. Swallow whole. 90 tablet 3  . atorvastatin (LIPITOR) 10 MG tablet TAKE 1 TABLET (10 MG TOTAL) BY MOUTH DAILY. 30 tablet 3  . atorvastatin  (LIPITOR) 40 MG tablet Take 1 tablet (40 mg total) by mouth daily. 90 tablet  3  . atorvastatin (LIPITOR) 40 MG tablet TAKE 1 TABLET (40 MG TOTAL) BY MOUTH DAILY. 90 tablet 3  . Cholecalciferol (VITAMIN D-3 PO) Take 1 capsule by mouth daily.    . clonazePAM (KLONOPIN) 0.5 MG tablet TAKE 1 TABLET (0.5 MG TOTAL) BY MOUTH 2 (TWO) TIMES DAILY. 60 tablet 0  . ferrous sulfate 325 (65 FE) MG tablet Take 1 tablet (325 mg total) by mouth daily with breakfast. 30 tablet 3  . folic acid (FOLVITE) 1 MG tablet Take 1 tablet (1 mg total) by mouth daily. 30 tablet 3  . hydrOXYzine (ATARAX/VISTARIL) 25 MG tablet TAKE 2 TABLETS (50 MG TOTAL) BY MOUTH THREE TIMES DAILY AS NEEDED (FOR ANXIETY). 120 tablet 0  . hydrOXYzine (ATARAX/VISTARIL) 50 MG tablet Take 1 tablet (50 mg total) by mouth 3 (three) times daily as needed (FOR ANXIETY). 60 tablet 0  . hydrOXYzine (VISTARIL) 50 MG capsule TAKE 1 TABLET (50 MG TOTAL) BY MOUTH 3 (THREE) TIMES DAILY AS NEEDED FOR ITCHING OR ANXIETY. 90 capsule 4  . insulin aspart (NOVOLOG) 100 UNIT/ML FlexPen CBG 70 - 120:             0 units  CBG 121 - 150: 2 units  CBG 151 - 200: 3 units  CBG 201 - 250: 5 units  CBG 251 - 300: 8 units  CBG 301 - 350: 11 units  CBG 351 - 400: 15 units  CBG > 400:                 Call Primary provider 15 mL 0  . insulin aspart (NOVOLOG) 100 UNIT/ML FlexPen USE UP TO 15 UNITS PER SLIDING SCALE. SEE ATTACHED SHEET 15 mL 0  . insulin aspart (NOVOLOG) 100 UNIT/ML injection INJECT 5-15 UNITS INTO THE SKIN 3 (THREE) TIMES DAILY BEFORE MEALS. (Patient taking differently: Inject 5-15 Units into the skin 3 (three) times daily before meals. Inject 5-15 units into the skin three times a day before meals, PER SLIDING SCALE) 10 mL 6  . insulin glargine (LANTUS) 100 UNIT/ML Solostar Pen Inject 20 Units into the skin daily. 15 mL 0  . insulin glargine (LANTUS) 100 UNIT/ML Solostar Pen INJECT 20 UNITS INTO THE SKIN DAILY. 15 mL 0  . Insulin Pen Needle 32G X 4 MM MISC  USE AS DIRECTED 100 each 0  . Insulin Syringes, Disposable, U-100 1 ML MISC Use as directed 100 each 0  . loratadine (CLARITIN) 10 MG tablet Take 1 tablet (10 mg total) by mouth daily as needed for allergies. 30 tablet 2  . megestrol (MEGACE) 40 MG tablet Take 1 tablet (40 mg total) by mouth 2 (two) times daily. Can increase to two tablets twice a day in the event of heavy bleeding 60 tablet 5  . methocarbamol (ROBAXIN) 500 MG tablet Take 1 tablet (500 mg total) by mouth 2 (two) times daily. 20 tablet 0  . Multiple Vitamins-Minerals (ONE-A-DAY WOMENS PO) Take 1 tablet by mouth daily.    . nicotine (NICODERM CQ - DOSED IN MG/24 HOURS) 14 mg/24hr patch PLACE 1 PATCH (14 MG TOTAL) ONTO THE SKIN DAILY. 28 patch 2  . nicotine (NICODERM CQ - DOSED IN MG/24 HOURS) 21 mg/24hr patch Place 1 patch (21 mg total) onto the skin daily. 28 patch 0  . nicotine (NICODERM CQ - DOSED IN MG/24 HOURS) 21 mg/24hr patch PLACE 1 PATCH (21 MG TOTAL) ONTO THE SKIN DAILY. 28 patch 0  . nitroGLYCERIN (NITROSTAT) 0.4 MG SL tablet PLACE  1 TABLET (0.4 MG TOTAL) UNDER THE TONGUE EVERY 5 (FIVE) MINUTES AS NEEDED FOR CHEST PAIN. 25 tablet 2  . oxyCODONE (OXY IR/ROXICODONE) 5 MG immediate release tablet TAKE 1 TABLET (5 MG TOTAL) BY MOUTH EVERY FOUR HOURS AS NEEDED FOR SEVERE PAIN. 30 tablet 0  . oxyCODONE-acetaminophen (PERCOCET) 5-325 MG tablet Take 1 tablet by mouth every 6 (six) hours as needed for severe pain. 20 tablet 0  . TRUEPLUS INSULIN SYRINGE 31G X 5/16" 1 ML MISC USE AS DIRECTED 100 each 3  . TRUEplus Lancets 28G MISC Use as directed 100 each 4  . vitamin B-12 (CYANOCOBALAMIN) 1000 MCG tablet Take 2 tablets (2,000 mcg total) by mouth daily. 30 tablet 3  . warfarin (COUMADIN) 5 MG tablet Take 1-2 tablets (5-10 mg total) by mouth See admin instructions. Take 5 mg by mouth at NOON on Sun/Tues/Thurs/Sat and 10 mg on Mon/Wed/Fri 30 tablet 0  . warfarin (COUMADIN) 5 MG tablet TAKE 5 MG BY MOUTH AT NOON ON SUN/TUES/THURS/SAT AND  10 MG ON MON/WED/FRI 40 tablet 0  . albuterol (VENTOLIN HFA) 108 (90 Base) MCG/ACT inhaler INHALE 2 PUFFS INTO THE LUNGS EVERY 6 (SIX) HOURS AS NEEDED FOR WHEEZING OR SHORTNESS OF BREATH. 8.5 g 2  . amLODipine (NORVASC) 5 MG tablet Take 1 tablet (5 mg total) by mouth daily. 30 tablet 11  . buPROPion (WELLBUTRIN SR) 150 MG 12 hr tablet Take 1 tablet (150 mg total) by mouth 2 (two) times daily. (Patient taking differently: Take 300 mg by mouth daily.) 60 tablet 1  . gabapentin (NEURONTIN) 400 MG capsule TAKE 1 CAPSULE (400 MG TOTAL) BY MOUTH 3 (THREE) TIMES DAILY. (Patient taking differently: Take 400 mg by mouth 3 (three) times daily as needed (as directed).) 90 capsule 4  . glucose blood test strip USE AS INSTRUCTED TO CHECK BLOOD SUGAR THREE TIMES DAILY (Patient taking differently: USE AS INSTRUCTED TO CHECK BLOOD SUGAR THREE TIMES DAILY) 100 strip 2  . insulin glargine (LANTUS) 100 UNIT/ML injection INJECT 0.65 MLS (65 UNITS TOTAL) INTO THE SKIN AT BEDTIME. (Patient taking differently: Inject 65-68 Units into the skin at bedtime.) 10 mL 6  . sertraline (ZOLOFT) 50 MG tablet Take 50 mg by mouth daily.    . sucralfate (CARAFATE) 1 GM/10ML suspension Take 10 mLs (1 g total) by mouth 2 (two) times daily with breakfast and lunch. 420 mL 0  . carvedilol (COREG) 3.125 MG tablet Take 1 tablet (3.125 mg total) by mouth 2 (two) times daily. 60 tablet 6   No facility-administered medications prior to visit.      Review of Systems     Objective:   Physical Exam There were no vitals filed for this visit. No exam this is a phone visit     Assessment & Plan:  I personally reviewed all images and lab data in the [facility] system as well as any outside material available during this office visit and agree with the  radiology impressions.   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 peripheral vascular disease lower extremity with critical limb ischemia and a prior  history of recurrent revascularization of the same extremity  Care per vascular surgery  Come to the clinic today for a pro time INR  Follow-up short-term with our warfarin clinic and her primary care provider  Continue aspirin and atorvastatin pain management per vascular surgery  Critical limb ischemia with history of revascularization of same extremity ([place]) As per left  iliac artery occlusion  Insulin-requiring or dependent type II diabetes mellitus (HCC) Poorly controlled type 2 diabetes we will increase Lantus to 60 units and continue sliding scale insulin    Neuropathy of left lower extremity Chronic neuropathy of the lower extremity gabapentin refilled  Stage 3b chronic kidney disease Monitor renal function  Tobacco abuse    . Current smoking consumption amount: 1 pack/day  . Dicsussion on advise to quit smoking and smoking impacts: Cardiovascular lung impacts  . Patient's willingness to quit: Wanting to cut back  . Methods to quit smoking discussed: Behavioral modification  . Medication management of smoking session drugs discussed: Patient is already on bupropion and this will be refilled  . Resources provided:  AVS   . Setting quit date not established  . Follow-up arranged 1 month   Time spent counseling the patient: 5 minutes   Cocaine use disorder, moderate, dependence ([place]) History of recurrent cocaine use she states she is currently abstinent will need close follow-up with mental health  Anxiety and depression Encouraged to follow-up with mental health no change in mental health medications I did refill the Zoloft and the Wellbutrin  HTN (hypertension) Hypertension well controlled during hospitalization have refilled amlodipine and Coreg  Hyperlipidemia associated with type 2 diabetes mellitus ([place]) Refill atorvastatin follow-up lipid panel   Diagnoses and all orders for this visit:  Essential hypertension -     amLODipine (NORVASC) 5 MG  tablet; Take 1 tablet (5 mg total) by mouth daily.  Major depressive disorder, recurrent episode with anxious distress (HCC) -     buPROPion (WELLBUTRIN SR) 150 MG 12 hr tablet; Take 1 tablet (150 mg total) by mouth 2 (two) times daily.  Type 2 diabetes mellitus with microalbuminuria, with long-term current use of insulin (HCC) -     insulin glargine (LANTUS) 100 UNIT/ML injection; Inject 0.6 mLs (60 Units total) into the skin at bedtime.  Thrombosis of left iliac artery (HCC)  Critical limb ischemia with history of revascularization of same extremity (HCC)  Insulin-requiring or dependent type II diabetes mellitus (HCC)  Neuropathy of left lower extremity  Stage 3b chronic kidney disease (HCC)  Tobacco abuse  Cocaine use disorder, moderate, dependence (HCC)  Anxiety and depression  Hyperlipidemia associated with type 2 diabetes mellitus ([place])  Other orders -     carvedilol (COREG) 3.125 MG tablet; Take 1 tablet (3.125 mg total) by mouth 2 (two) times daily. -     gabapentin (NEURONTIN) 400 MG capsule; TAKE 1 CAPSULE (400 MG TOTAL) BY MOUTH 3 (THREE) TIMES DAILY. -     sucralfate (CARAFATE) 1 GM/10ML suspension; Take 10 mLs (1 g total) by mouth 2 (two) times daily with breakfast and lunch. -     sertraline (ZOLOFT) 100 MG tablet; Take 1 tablet (100 mg total) by mouth daily. -     albuterol (VENTOLIN HFA) 108 (90 Base) MCG/ACT inhaler; INHALE 2 PUFFS INTO THE LUNGS EVERY 6 (SIX) HOURS AS NEEDED FOR WHEEZING OR SHORTNESS OF BREATH. -     glucose blood test strip; USE AS INSTRUCTED TO CHECK BLOOD SUGAR THREE TIMES DAILY    I spent 35 minutes going through the patient's past records recent hospital notes and providing telephone nonface-to-face time during this encounter  Follow Up Instructions: I am providing a more recent follow-up for the patient with her primary care provider and I sent refills on all her medications I am also having her come today to pick up her medications our  pharmacy  and see our clinical pharmacist for an INR check  She is encouraged to follow-up with her mental health provider   I discussed the assessment and treatment plan with the patient. The patient was provided an opportunity to ask questions and all were answered. The patient agreed with the plan and demonstrated an understanding of the instructions.   The patient was advised to call back or seek an in-person evaluation if the symptoms worsen or if the condition fails to improve as anticipated.  I provided 35 minutes of non-face-to-face time during this encounter  including  median intraservice time , review of notes, labs, imaging, medications  and explaining diagnosis and management to the patient .    Asencion Noble, MD

## 2021-01-08 NOTE — Assessment & Plan Note (Signed)
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

## 2021-01-08 NOTE — Assessment & Plan Note (Signed)
Chronic neuropathy of the lower extremity gabapentin refilled

## 2021-01-08 NOTE — Assessment & Plan Note (Signed)
  .   Current smoking consumption amount: 1 pack/day  . Dicsussion on advise to quit smoking and smoking impacts: Cardiovascular lung impacts  . Patient's willingness to quit: Wanting to cut back  . Methods to quit smoking discussed: Behavioral modification  . Medication management of smoking session drugs discussed: Patient is already on bupropion and this will be refilled  . Resources provided:  AVS   . Setting quit date not established  . Follow-up arranged 1 month   Time spent counseling the patient: 5 minutes

## 2021-01-08 NOTE — Assessment & Plan Note (Signed)
Hypertension well controlled during hospitalization have refilled amlodipine and Coreg

## 2021-01-08 NOTE — Assessment & Plan Note (Signed)
Refill atorvastatin follow-up lipid panel

## 2021-01-08 NOTE — Assessment & Plan Note (Signed)
Encouraged to follow-up with mental health no change in mental health medications I did refill the Zoloft and the Wellbutrin

## 2021-01-09 ENCOUNTER — Telehealth: Payer: Self-pay

## 2021-01-09 ENCOUNTER — Other Ambulatory Visit: Payer: Self-pay

## 2021-01-09 ENCOUNTER — Other Ambulatory Visit (HOSPITAL_COMMUNITY): Payer: Self-pay

## 2021-01-09 NOTE — Telephone Encounter (Addendum)
Patient left VM asking for a call back. Attempted to call - UTR and VM not set up.  VM received about patient needing pain medicine - she's s/p aogram/lysis recheck on 3/28. Attempted to call back, UTR - VM not set up.

## 2021-01-12 ENCOUNTER — Telehealth: Payer: Self-pay | Admitting: Internal Medicine

## 2021-01-12 NOTE — Discharge Summary (Signed)
Discharge Summary     Terri Sparks Nov 27, 1975 45 y.o. female  [id number]  Admission Date: 12/27/2020  Discharge Date: 01/07/2021  Physician: No att. providers found  Admission Diagnosis: Lower limb ischemia [I99.8] Critical limb ischemia with history of revascularization of same extremity ([place]) [I70.229, Z98.890]  HPI:   This is a 45 y.o. female  very well-known to our service, who presents to [facility], ER with a painful left foot.  The pain is typical of previous episodes of acute limb ischemia she has suffered.  Her vascular history is detailed below.  She can move the foot.  She has sensation in the foot.  She reports not taking Coumadin for several days.  She reports she "forgets" and sometimes "gets depressed and does not care" about her anticoagulation.  She continues to smoke.  She says "this time things are going to be different" when she leaves.  VASCULAR SURGICAL HISTORY:  02/24/2017 - left lower extremity angiogram, stenting left common iliac artery (8 x 59 mm iCast), open thromboembolectomy via femoral, popliteal and posterior tibial exposures by Dr. Donzetta Matters.  11/14/2017 - left lower extremity angiogram via right common femoral access Dr. Donzetta Matters  02/25/2018 - redo open iliofemoral and popliteal/tibial embolectomy via left femoral exposure by Dr. Bridgett Larsson  03/03/2018 - exploration and debridement of infected left groin wound by Dr. Scot Dock  05/12/2018 - mechanical thrombectomy of left common iliac, left femoral, left popliteal, left anterior tibial artery with penumbra device by Drs. Brabham and [facility] Course:  The patient was admitted to the hospital and taken to the operating room on 12/29/2020 and underwent: 1) US guided left brachial artery access 2) Aortogram 3) Left lower extremity angiogram with second order cannulation (total contrast ~15cc) 4) Initiation of intra-arterial thrombolysis    Findings: occlusion of left common iliac stent. Reconstitution of  femoral bifurcation. Thrombolysis catheter placed across lesion from terminal aorta to left superficial femoral artery. Thromobolysis to proceed per routine. Takeback tomorrow for catheter check and possible intervention with Dr. Donzetta Matters in the cath lab.  The pt tolerated the procedure well and was transported to the PACU in good condition.   By POD 1, foot feeling better and she was taken back to the [place] lab for lytic recheck with the following procedure: 1.  Lysis recheck with aortogram with limited left lower extremity angiogram from brachial sheath 2.  Ultrasound-guided cannulation right common femoral artery 3.  Left lower extremity angiography 4.  Mechanical thrombectomy with penumbra cat 6 of popliteal, tibioperoneal trunk, peroneal and anterior tibial arteries and cat 3 mechanical thrombectomy of anterior tibial and peroneal arteries 5.  Moderate sedation with fentanyl Versed 407 minutes 6.  Minx device closure right common femoral artery  Findings: The stent in the left common neck artery was patent with approximately 30% stenosis which was not intervened upon.  There was acute appearing clot at the proximal above-knee popliteal artery and below the knee at the tibioperoneal trunk takeoff of the anterior tibial artery.  All clot was removed from the popliteal artery as well as the takeoff of the tibioperoneal trunk and anterior tibial artery.  Unfortunately there appears to be outflow thrombosis of the anterior tibial artery and peroneal arteries.  There is no reconstitution of the posterior tibial artery distally.  We did perform mechanical thrombectomy of the anterior tibial and peroneal arteries I did have better flow in the peroneal artery to the level of the distal ankle I also injected nitroglycerin total of 100 mcg.  I could never reestablished distal flow into the foot which does appear to be somewhat of a chronic process and possibly consistent with her previous angiography performed in the  OR in the past.  The clot that was removed from above the knee and the tibioperoneal trunk did appear to be acute thrombosis everything that was removed from the tibial arteries themselves appeared to be chronic in nature. At completion I cannot identify any signals distally in the foot however patient did have sensation and motor intact.   That evening, she was evaluated by Dr. Donzetta Matters.  She has sensation and motor function of the left foot and it does not appear ischemic it is actually warm to the touch.  There is her usual signal in the lateral aspect of the foot and this was marked.  She is waiting to have her left brachial sheath removed and we will restart heparin 2 hours after.  She is okay for general diet at this time I have asked her to be n.p.o. past midnight so we can recheck her in the morning.  We will also keep her central line in place for tonight and keep her in the ICU.  She is asking for help with drug addiction and we will discuss with social work prior to discharge.  POD 1, coumadin therapy was again discussed with pt and the importance of Coumadin therapy moving forward as well as her high likelihood of amputation should she have recurrent acute limb ischemia of the left lower extremity.  Thankfully at this time the foot is warm and well-perfused sensorimotor intact with her expected signal distally on the lateral foot.  Left hand is also well perfused after brachial sheath pull.  We will initiate Coumadin therapy today she is okay for diet and transfer to the floor today.  POD 2, doing well with brisk DP/pero signlas.  She did have some duskiness of toes on the left that we will allow time to demarcate. Heparin to coumadin bridge continued.   POD 3, changed to lovenox for coumadin bridge.    POD 4, continue bridge to coumadin.  BLE well perfused on doppler exam.    POD 5, She has left Doppler signal on the lateral foot.  She is having no pain in the foot.  She does feel she needs 1  more day.  Plan was discharge on Lovenox bridge with Coumadin.  Today her INR is 2.0.  We will recheck an INR tomorrow and ultimately may not need a bridge which I discussed with her today.  Overall looks good.  No complaints  POD  4, She has a nice warm foot today and good signal on the top of the foot with Doppler.  INR is 2.3.  Continue coumadin.  Plan was discharge today but she does not feel ready for discharge and wants 1 more day.  I think she is very fearful of readmission and there are some outstanding social issues.  POD 5,  Currently limited by social issues and pain.  We are continuing with p.o. pain control and ambulating as tolerated and hopefully home early this week.  POD 6, INR 2.0.  Pt discharged home.   CBC    Component Value Date/Time   WBC 9.7 01/04/2021 0155   RBC 4.04 01/04/2021 0155   HGB 10.4 (L) 01/04/2021 0155   HGB 14.6 01/21/2020 1603   HCT 33.9 (L) 01/04/2021 0155   HCT 44.1 01/21/2020 1603   PLT 399 01/04/2021 0155  PLT 490 (H) 01/27/2018 1157   MCV 83.9 01/04/2021 0155   MCV 89 01/21/2020 1603   MCH 25.7 (L) 01/04/2021 0155   MCHC 30.7 01/04/2021 0155   RDW 15.9 (H) 01/04/2021 0155   RDW 13.5 01/21/2020 1603   LYMPHSABS 3.0 12/27/2020 1113   LYMPHSABS 3.1 01/21/2020 1603   MONOABS 0.8 12/27/2020 1113   EOSABS 0.4 12/27/2020 1113   EOSABS 0.4 01/21/2020 1603   BASOSABS 0.1 12/27/2020 1113   BASOSABS 0.1 01/21/2020 1603    BMET    Component Value Date/Time   NA 131 (L) 01/02/2021 0128   NA 135 07/29/2020 1528   K 4.6 01/02/2021 0128   CL 101 01/02/2021 0128   CO2 23 01/02/2021 0128   GLUCOSE 287 (H) 01/02/2021 0128   BUN 14 01/02/2021 0128   BUN 19 07/29/2020 1528   CREATININE 1.97 (H) 01/02/2021 0128   CREATININE 1.88 (H) 02/19/2020 1555   CALCIUM 9.2 01/02/2021 0128   GFRNONAA 31 (L) 01/02/2021 0128   GFRNONAA 32 (L) 02/19/2020 1555   GFRAA 30 (L) 07/29/2020 1528   GFRAA 37 (L) 02/19/2020 1555     Discharge Instructions     Discharge patient   Complete by: As directed    Discharge disposition: 01-Home or Self Care   Discharge patient date: 01/06/2021      Discharge Diagnosis:  Lower limb ischemia [I99.8] Critical limb ischemia with history of revascularization of same extremity ([place]) [I70.229, Z98.890]  Secondary Diagnosis: Patient Active Problem List   Diagnosis Date Noted  . Hyperlipidemia associated with type 2 diabetes mellitus ([place]) 01/08/2021  . Critical limb ischemia with history of revascularization of same extremity ([place]) 12/27/2020  . Cocaine use disorder, moderate, dependence ([place]) 07/16/2020  . Cannabis use disorder, moderate, dependence ([place]) 07/16/2020  . Homeless 11/13/2019  . Chest pain at rest 09/24/2019  . Polysubstance abuse ([place]) 09/23/2019  . Stage 3b chronic kidney disease ([place]) 07/30/2019  . Iron deficiency anemia due to chronic blood loss 06/12/2019  . Vitamin B12 deficiency 06/12/2019  . Folate deficiency 06/12/2019  . Constipation 06/12/2019  . Prolonged QT interval   . HTN (hypertension) 06/26/2018  . Subtherapeutic international normalized ratio (INR) 06/26/2018  . Adrenal nodule ([place]) 06/21/2018  . Lung nodules 06/21/2018  . Abnormal uterine bleeding (AUB) 05/14/2018  . Ischemia of extremity 05/12/2018  . Former smoker 03/14/2018  . Thrombosis of left iliac artery ([place]) 02/25/2018  . Thromboembolism ([place]) 02/25/2018  . Microalbuminuria 09/29/2017  . Insomnia 09/29/2017  . Anxiety and depression 04/28/2017  . 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 Iliac artery occlusion, left ([place]) 02/25/2017  . Tobacco abuse 02/25/2017  . Insulin-requiring or dependent type II diabetes mellitus ([place]) 02/25/2017  . Peripheral vascular disease of lower extremity ([place]) 02/24/2017  . GERD (gastroesophageal reflux disease)   . Hyperparathyroidism    Past Medical History:  Diagnosis Date  . Anxiety   . Depression   . Diabetes ([place])    Type 2  . GERD (gastroesophageal  reflux disease)   . History of blood clots    ,DVT-left leg, and early 2000, had blood clot behind left breast  . History of kidney stones   . Hypercalcemia   . Hyperparathyroidism   . Hypertension    had been on Lisinopril and PCP took her off it 4 months ago  . Iron deficiency anemia   . Left thyroid nodule   . Mass of right ovary   . Nephrolithiasis   . PAOD (  peripheral arterial occlusive disease) ([place])   . Peripheral vascular disease ([place])   . Prolonged Q-T interval on ECG 04/19/2019  . Renal calculi   . Substance abuse ([place])   . Tooth loose    top tooth from the right  is loose      Allergies as of 01/06/2021      Reactions   Ciprofloxacin Hcl Hives   Dilaudid [hydromorphone Hcl] Shortness Of Breath, Other (See Comments)   "Asystole," per pt report   Macrobid [nitrofurantoin Macrocrystal] Hives, Shortness Of Breath, Rash   Other Hives, Shortness Of Breath, Rash   NO "-CILLINS"!!!   Penicillins Shortness Of Breath   Had had cephalosporins without incident Has patient had a PCN reaction causing immediate rash, facial/tongue/throat swelling, SOB or lightheadedness with hypotension: Yes Has patient had a PCN reaction causing severe rash involving mucus membranes or skin necrosis: Unk Has patient had a PCN reaction that required hospitalization: Unk Has patient had a PCN reaction occurring within the last 10 years: No If all of the above answers are "NO", then may proceed with Cephalosporin use.   Sulfa Antibiotics Hives, Shortness Of Breath, Rash   Lexapro [escitalopram Oxalate] Other (See Comments)   "I just did not like it."      Medication List    STOP taking these medications   enoxaparin 120 MG/0.8ML injection Commonly known as: LOVENOX     TAKE these medications   acetaminophen 500 MG tablet Commonly known as: TYLENOL Take 2,000 mg by mouth daily as needed (for headaches).   aspirin 81 MG EC tablet Take 1 tablet (81 mg total) by mouth daily. Swallow  whole. What changed: You were already taking a medication with the same name, and this prescription was added. Make sure you understand how and when to take each.   Aspirin Low Dose 81 MG EC tablet Generic drug: aspirin TAKE 1 TABLET (81 MG TOTAL) BY MOUTH DAILY. SWALLOW WHOLE. What changed: Another medication with the same name was added. Make sure you understand how and when to take each.   atorvastatin 10 MG tablet Commonly known as: LIPITOR TAKE 1 TABLET (10 MG TOTAL) BY MOUTH DAILY. What changed: Another medication with the same name was added. Make sure you understand how and when to take each.   atorvastatin 40 MG tablet Commonly known as: LIPITOR Take 1 tablet (40 mg total) by mouth daily. What changed: You were already taking a medication with the same name, and this prescription was added. Make sure you understand how and when to take each.   atorvastatin 40 MG tablet Commonly known as: LIPITOR TAKE 1 TABLET (40 MG TOTAL) BY MOUTH DAILY. What changed: Another medication with the same name was added. Make sure you understand how and when to take each.   clonazePAM 0.5 MG tablet Commonly known as: KLONOPIN TAKE 1 TABLET (0.5 MG TOTAL) BY MOUTH 2 (TWO) TIMES DAILY. Notes to patient: Please check with your primary provider about continuing therapy and getting refills   ferrous sulfate 325 (65 FE) MG tablet Take 1 tablet (325 mg total) by mouth daily with breakfast.   folic acid 1 MG tablet Commonly known as: FOLVITE Take 1 tablet (1 mg total) by mouth daily.   hydrOXYzine 50 MG capsule Commonly known as: VISTARIL TAKE 1 TABLET (50 MG TOTAL) BY MOUTH 3 (THREE) TIMES DAILY AS NEEDED FOR ITCHING OR ANXIETY.   hydrOXYzine 50 MG tablet Commonly known as: ATARAX/VISTARIL Take 1 tablet (50 mg total) by  mouth 3 (three) times daily as needed (FOR ANXIETY). What changed: You were already taking a medication with the same name, and this prescription was added. Make sure you  understand how and when to take each.   hydrOXYzine 25 MG tablet Commonly known as: ATARAX/VISTARIL TAKE 2 TABLETS (50 MG TOTAL) BY MOUTH THREE TIMES DAILY AS NEEDED (FOR ANXIETY). What changed: Another medication with the same name was added. Make sure you understand how and when to take each.   insulin aspart 100 UNIT/ML injection Commonly known as: novoLOG INJECT 5-15 UNITS INTO THE SKIN 3 (THREE) TIMES DAILY BEFORE MEALS. What changed:   how much to take  how to take this  when to take this  additional instructions   insulin aspart 100 UNIT/ML FlexPen Commonly known as: NOVOLOG CBG 70 - 120:             0 units  CBG 121 - 150: 2 units  CBG 151 - 200: 3 units  CBG 201 - 250: 5 units  CBG 251 - 300: 8 units  CBG 301 - 350: 11 units  CBG 351 - 400: 15 units  CBG > 400:                 Call Primary provider What changed: You were already taking a medication with the same name, and this prescription was added. Make sure you understand how and when to take each.   NovoLOG FlexPen 100 UNIT/ML FlexPen Generic drug: insulin aspart USE UP TO 15 UNITS PER SLIDING SCALE. SEE ATTACHED SHEET What changed:   Another medication with the same name was added. Make sure you understand how and when to take each.  Another medication with the same name was changed. Make sure you understand how and when to take each.   insulin glargine 100 UNIT/ML Solostar Pen Commonly known as: LANTUS Inject 20 Units into the skin daily. What changed: You were already taking a medication with the same name, and this prescription was added. Make sure you understand how and when to take each.   Lantus SoloStar 100 UNIT/ML Solostar Pen Generic drug: insulin glargine INJECT 20 UNITS INTO THE SKIN DAILY. What changed: Another medication with the same name was added. Make sure you understand how and when to take each.   Insulin Syringes (Disposable) U-100 1 ML Misc Use as directed   loratadine 10 MG  tablet Commonly known as: CLARITIN Take 1 tablet (10 mg total) by mouth daily as needed for allergies.   megestrol 40 MG tablet Commonly known as: MEGACE Take 1 tablet (40 mg total) by mouth 2 (two) times daily. Can increase to two tablets twice a day in the event of heavy bleeding Notes to patient: Please check with your primary provider about continuing therapy and getting refills   methocarbamol 500 MG tablet Commonly known as: ROBAXIN Take 1 tablet (500 mg total) by mouth 2 (two) times daily. Notes to patient: Please check with your primary provider about continuing therapy and getting refills   nicotine 14 mg/24hr patch Commonly known as: NICODERM CQ - dosed in mg/24 hours PLACE 1 PATCH (14 MG TOTAL) ONTO THE SKIN DAILY. What changed: Another medication with the same name was added. Make sure you understand how and when to take each.   nicotine 21 mg/24hr patch Commonly known as: NICODERM CQ - dosed in mg/24 hours Place 1 patch (21 mg total) onto the skin daily. What changed: You were already taking a medication with  the same name, and this prescription was added. Make sure you understand how and when to take each.   nicotine 21 mg/24hr patch Commonly known as: NICODERM CQ - dosed in mg/24 hours PLACE 1 PATCH (21 MG TOTAL) ONTO THE SKIN DAILY. What changed: Another medication with the same name was added. Make sure you understand how and when to take each.   nitroGLYCERIN 0.4 MG SL tablet Commonly known as: NITROSTAT PLACE 1 TABLET (0.4 MG TOTAL) UNDER THE TONGUE EVERY 5 (FIVE) MINUTES AS NEEDED FOR CHEST PAIN.   ONE-A-DAY WOMENS PO Take 1 tablet by mouth daily.   oxyCODONE 5 MG immediate release tablet Commonly known as: Oxy IR/ROXICODONE TAKE 1 TABLET (5 MG TOTAL) BY MOUTH EVERY FOUR HOURS AS NEEDED FOR SEVERE PAIN.   oxyCODONE-acetaminophen 5-325 MG tablet Commonly known as: Percocet Take 1 tablet by mouth every 6 (six) hours as needed for severe pain.   PenTips  32G X 4 MM Misc Generic drug: Insulin Pen Needle USE AS DIRECTED   TRUEplus Insulin Syringe 31G X 5/16" 1 ML Misc Generic drug: Insulin Syringe-Needle U-100 USE AS DIRECTED   TRUEplus Lancets 28G Misc Use as directed   vitamin B-12 1000 MCG tablet Commonly known as: CYANOCOBALAMIN Take 2 tablets (2,000 mcg total) by mouth daily.   VITAMIN D-3 PO Take 1 capsule by mouth daily.   warfarin 5 MG tablet Commonly known as: COUMADIN Take as directed. If you are unsure how to take this medication, talk to your nurse or doctor. Original instructions: Take 1-2 tablets (5-10 mg total) by mouth See admin instructions. Take 5 mg by mouth at NOON on Sun/Tues/Thurs/Sat and 10 mg on Mon/Wed/Fri What changed: You were already taking a medication with the same name, and this prescription was added. Make sure you understand how and when to take each.   warfarin 5 MG tablet Commonly known as: COUMADIN Take as directed. If you are unsure how to take this medication, talk to your nurse or doctor. Original instructions: TAKE 5 MG BY MOUTH AT NOON ON SUN/TUES/THURS/SAT AND 10 MG ON MON/WED/FRI What changed: Another medication with the same name was added. Make sure you understand how and when to take each.       Discharge Instructions: [facility] Discharge instructions Lower Extremity Bypass Surgery  Please refer to the following instruction for your post-procedure care. Your surgeon or physician assistant will discuss any changes with you.  Activity  You are encouraged to walk as much as you can. You can slowly return to normal activities during the month after your surgery. Avoid strenuous activity and heavy lifting until your doctor tells you it's OK. Avoid activities such as vacuuming or swinging a golf club. Do not drive until your doctor give the OK and you are no longer taking prescription pain medications. It is also normal to have difficulty with sleep  habits, eating and bowel movement after surgery. These will go away with time.  Bathing/Showering  You may shower after you go home. Do not soak in a bathtub, hot tub, or swim until the incision heals completely.  Incision Care  Clean your incision with mild soap and water. Shower every day. Pat the area dry with a clean towel. You do not need a bandage unless otherwise instructed. Do not apply any ointments or creams to your incision. If you have open wounds you will be instructed how to care for them or a visiting nurse may be arranged for  you. If you have staples or sutures along your incision they will be removed at your post-op appointment. You may have skin glue on your incision. Do not peel it off. It will come off on its own in about one week.  Wash the groin wound with soap and water daily and pat dry. (No tub bath-only shower)  Then put a dry gauze or washcloth in the groin to keep this area dry to help prevent wound infection.  Do this daily and as needed.  Do not use Vaseline or neosporin on your incisions.  Only use soap and water on your incisions and then protect and keep dry.  Diet  Resume your normal diet. There are no special food restrictions following this procedure. A low fat/ low cholesterol diet is recommended for all patients with vascular disease. In order to heal from your surgery, it is CRITICAL to get adequate nutrition. Your body requires vitamins, minerals, and protein. Vegetables are the best source of vitamins and minerals. Vegetables also provide the perfect balance of protein. Processed food has little nutritional value, so try to avoid this.  Medications  Resume taking all your medications unless your doctor or Physician Assistant tells you not to. If your incision is causing pain, you may take over-the-counter pain relievers such as acetaminophen (Tylenol). If you were prescribed a stronger pain medication, please aware these medication can cause nausea and  constipation. Prevent nausea by taking the medication with a snack or meal. Avoid constipation by drinking plenty of fluids and eating foods with high amount of fiber, such as fruits, vegetables, and grains. Take Colace 100 mg (an over-the-counter stool softener) twice a day as needed for constipation.  Do not take Tylenol if you are taking prescription pain medications.  Follow Up  Our office will schedule a follow up appointment 2-3 weeks following discharge.  Please call us immediately for any of the following conditions  .Severe or worsening pain in your legs or feet while at rest or while walking .Increase pain, redness, warmth, or drainage (pus) from your incision site(s) . Fever of 101 degree or higher . The swelling in your leg with the bypass suddenly worsens and becomes more painful than when you were in the hospital . If you have been instructed to feel your graft pulse then you should do so every day. If you can no longer feel this pulse, call the office immediately. Not all patients are given this instruction. .  Leg swelling is common after leg bypass surgery.  The swelling should improve over a few months following surgery. To improve the swelling, you may elevate your legs above the level of your heart while you are sitting or resting. Your surgeon or physician assistant may ask you to apply an ACE wrap or wear compression (TED) stockings to help to reduce swelling.  Reduce your risk of vascular disease  Stop smoking. If you would like help call [facility] at 1-800-QUIT-NOW (780) 719-7562) or [facility] at [phone].  . Manage your cholesterol . Maintain a desired weight . Control your diabetes weight . Control your diabetes . Keep your blood pressure down .  If you have any questions, please call the office at [phone]   Prescriptions given: 1.  Percocet previously sent to pharmacy.  Disposition: home  Patient's condition: is Good  Follow up: 1. Dr.  Donzetta Matters in 2 weeks   Leontine Locket, PA-C [facility] [phone] 01/12/2021  8:22 AM  - For VQI Registry use ---  Post-op:  Wound infection: No  Graft infection: No  Transfusion: No    If yes, n/a units given New Arrhythmia: No Ipsilateral amputation: No, '[ ]'$  Minor, '[ ]'$  BKA, '[ ]'$  AKA Discharge patency: '[ ]'$  Primary, '[ ]'$  Primary assisted, '[x]'$  Secondary, '[ ]'$  Occluded Patency judged by: [x ] Dopper only, '[ ]'$  Palpable graft pulse, '[]'$  Palpable distal pulse, '[ ]'$  ABI inc. > 0.15, '[ ]'$  Duplex Discharge ABI: R not done, L not done D/C Ambulatory Status: Ambulatory  Complications: MI: No, '[ ]'$  Troponin only, '[ ]'$  EKG or Clinical CHF: No Resp failure:No, '[ ]'$  Pneumonia, '[ ]'$  Ventilator Chg in renal function: No, '[ ]'$  Inc. Cr > 0.5, '[ ]'$  Temp. Dialysis,  '[ ]'$  Permanent dialysis Stroke: No, '[ ]'$  Minor, '[ ]'$  Major Return to OR: No  Reason for return to OR: '[ ]'$  Bleeding, '[ ]'$  Infection, '[ ]'$  Thrombosis, '[ ]'$  Revision  Discharge medications: Statin use:  yes ASA use:  yes Plavix use:  no Beta blocker use: no CCB use:  No ACEI use:   no ARB use:  no Coumadin use: yes

## 2021-01-12 NOTE — Addendum Note (Signed)
Addended by: Asencion Noble E on: 01/12/2021 12:29 PM   Modules accepted: Orders

## 2021-01-12 NOTE — Telephone Encounter (Signed)
[facility] calling to ask the dr to change her  insulin glargine (LANTUS) 100 UNIT/ML VIAL To the   insulin aspart (NOVOLOG) 100 UNIT/ML FlexPen Also need the PEN NEEDLES that go with the flexPen  In addition, Pt needs new glucometer ACCU CHECK Guide meter Lancets Test strips  [place], [place]

## 2021-01-13 ENCOUNTER — Other Ambulatory Visit: Payer: Self-pay

## 2021-01-13 ENCOUNTER — Other Ambulatory Visit (HOSPITAL_COMMUNITY): Payer: Self-pay

## 2021-01-13 ENCOUNTER — Ambulatory Visit (INDEPENDENT_AMBULATORY_CARE_PROVIDER_SITE_OTHER): Payer: Medicaid Other | Admitting: Vascular Surgery

## 2021-01-13 DIAGNOSIS — L03116 Cellulitis of left lower limb: Secondary | ICD-10-CM

## 2021-01-13 MED ORDER — ONDANSETRON HCL 4 MG PO TABS: 4.0000 mg | ORAL_TABLET | Freq: Three times a day (TID) | ORAL | 0 refills | Status: DC | PRN

## 2021-01-13 MED ORDER — DOXYCYCLINE HYCLATE 100 MG PO CAPS: 100.0000 mg | ORAL_CAPSULE | Freq: Two times a day (BID) | ORAL | 0 refills | Status: DC

## 2021-01-13 MED ORDER — ACCU-CHEK GUIDE VI STRP: ORAL_STRIP | 2 refills | Status: DC

## 2021-01-13 MED ORDER — ACCU-CHEK GUIDE ME W/DEVICE KIT: PACK | 0 refills | Status: DC

## 2021-01-13 MED ORDER — CLINDAMYCIN HCL 300 MG PO CAPS: 300.0000 mg | ORAL_CAPSULE | Freq: Three times a day (TID) | ORAL | 0 refills | Status: AC

## 2021-01-13 MED ORDER — ACCU-CHEK FASTCLIX LANCETS MISC: 2 refills | Status: DC

## 2021-01-13 MED ORDER — LANTUS SOLOSTAR 100 UNIT/ML ~~LOC~~ SOPN: 60.0000 [IU] | PEN_INJECTOR | Freq: Every day | SUBCUTANEOUS | 2 refills | Status: DC

## 2021-01-13 NOTE — Progress Notes (Signed)
[facility] [place] PROGRESS NOTE  ASSESSMENT / PLAN: Terri Sparks is a 45 y.o. 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 Presents to triage today for evaluation of left foot pain. Clinically consistent with cellulitis. No evidence of ischemia on exam. Will Rx for clindamycin x 7 days. Continue warfarin indefinitely. Counseled her she would not receive any narcotics. Follow up with Dr. Donzetta Sparks as scheduled.  SUBJECTIVE: Reports L foot pain, swelling and redness.   OBJECTIVE: LMP 12/21/2020   AF VSS L foot warm and well perfused Painful, erythematous L foot 1+ edema about the foot and ankle Strong DS in lateral L foot  Terri Sparks. Terri Breed, MD [facility] Phone Number: [phone] 01/13/2021 2:03 PM

## 2021-01-13 NOTE — Telephone Encounter (Signed)
Rx sent 

## 2021-01-14 ENCOUNTER — Other Ambulatory Visit (HOSPITAL_COMMUNITY): Payer: Self-pay

## 2021-01-19 ENCOUNTER — Other Ambulatory Visit: Payer: Self-pay

## 2021-01-19 DIAGNOSIS — I779 Disorder of arteries and arterioles, unspecified: Secondary | ICD-10-CM

## 2021-01-26 ENCOUNTER — Telehealth: Payer: Self-pay | Admitting: Internal Medicine

## 2021-01-26 NOTE — Telephone Encounter (Signed)
-----   Message from Tresa Endo, [place] sent at 01/26/2021  9:20 AM EDT ----- Yes ma'am we instructed her to come in to see me last month when she cam in to pick-up pharmacy refills but she never showed up. Will continue trying to get her in.  ----- Message ----- From: Ladell Pier, MD Sent: 01/25/2021   2:54 PM EDT To: Elsie Stain, MD, #  I see pt hospitalized this mth with ischemic foot after she skipped taking her Coumadin.  Had hospital f/u visit with Dr. Joya Gaskins.  Please reach out to pt and get her back in with you for INR monitoring.  Looks like her last INR was done when she left hospital earlier this month. Don't want her to fall through the cracks.  Thanks.

## 2021-02-02 ENCOUNTER — Other Ambulatory Visit (HOSPITAL_COMMUNITY): Payer: Self-pay | Admitting: Psychiatry

## 2021-02-02 ENCOUNTER — Other Ambulatory Visit: Payer: Self-pay | Admitting: Critical Care Medicine

## 2021-02-02 ENCOUNTER — Other Ambulatory Visit: Payer: Self-pay | Admitting: Internal Medicine

## 2021-02-02 DIAGNOSIS — F411 Generalized anxiety disorder: Secondary | ICD-10-CM

## 2021-02-02 DIAGNOSIS — F339 Major depressive disorder, recurrent, unspecified: Secondary | ICD-10-CM

## 2021-02-05 ENCOUNTER — Inpatient Hospital Stay: Payer: Medicaid Other | Admitting: Internal Medicine

## 2021-02-10 ENCOUNTER — Telehealth (HOSPITAL_COMMUNITY): Payer: Medicaid Other | Admitting: Psychiatry

## 2021-02-11 ENCOUNTER — Telehealth (HOSPITAL_COMMUNITY): Payer: Self-pay | Admitting: *Deleted

## 2021-02-11 NOTE — Telephone Encounter (Signed)
I had sent links to the number on her chart 2 times and also had called the phone number on the chart 2 times. She did not respond to any. No refills will be sent with out pt being seen. Give her information of our walk-in hours.

## 2021-02-11 NOTE — Telephone Encounter (Signed)
Called for meds, she is seeking her Klonopin and Hydroxyzine. She had an appt yesterday with Dr Toy Care but missed it because she said she did not get a call or a link in my chart. She was rescheduled for 6/9 at 1030. Will ask Dr Toy Care to call new rx to [facility], as [facility].

## 2021-02-13 ENCOUNTER — Encounter (HOSPITAL_COMMUNITY): Payer: Medicaid Other

## 2021-02-13 ENCOUNTER — Ambulatory Visit (HOSPITAL_COMMUNITY): Payer: Medicaid Other | Attending: Vascular Surgery

## 2021-02-17 ENCOUNTER — Telehealth: Payer: Self-pay | Admitting: Pharmacist

## 2021-02-17 ENCOUNTER — Encounter: Payer: Self-pay | Admitting: Internal Medicine

## 2021-02-17 ENCOUNTER — Other Ambulatory Visit: Payer: Self-pay

## 2021-02-17 ENCOUNTER — Ambulatory Visit: Payer: Medicaid Other | Attending: Internal Medicine | Admitting: Internal Medicine

## 2021-02-17 ENCOUNTER — Telehealth: Payer: Self-pay

## 2021-02-17 DIAGNOSIS — I152 Hypertension secondary to endocrine disorders: Secondary | ICD-10-CM

## 2021-02-17 DIAGNOSIS — J301 Allergic rhinitis due to pollen: Secondary | ICD-10-CM | POA: Diagnosis not present

## 2021-02-17 DIAGNOSIS — E538 Deficiency of other specified B group vitamins: Secondary | ICD-10-CM

## 2021-02-17 DIAGNOSIS — I749 Embolism and thrombosis of unspecified artery: Secondary | ICD-10-CM

## 2021-02-17 DIAGNOSIS — F1910 Other psychoactive substance abuse, uncomplicated: Secondary | ICD-10-CM

## 2021-02-17 DIAGNOSIS — E1169 Type 2 diabetes mellitus with other specified complication: Secondary | ICD-10-CM | POA: Diagnosis not present

## 2021-02-17 DIAGNOSIS — E1159 Type 2 diabetes mellitus with other circulatory complications: Secondary | ICD-10-CM | POA: Diagnosis not present

## 2021-02-17 DIAGNOSIS — F339 Major depressive disorder, recurrent, unspecified: Secondary | ICD-10-CM | POA: Diagnosis not present

## 2021-02-17 DIAGNOSIS — D6859 Other primary thrombophilia: Secondary | ICD-10-CM | POA: Diagnosis not present

## 2021-02-17 DIAGNOSIS — Z6841 Body Mass Index (BMI) 40.0 and over, adult: Secondary | ICD-10-CM | POA: Insufficient documentation

## 2021-02-17 LAB — POCT INR: INR: 1.2 — AB (ref 2.0–3.0)

## 2021-02-17 LAB — GLUCOSE, POCT (MANUAL RESULT ENTRY): POC Glucose: 203 mg/dl — AB (ref 70–99)

## 2021-02-17 MED ORDER — SUCRALFATE 1 GM/10ML PO SUSP: 1.0000 g | Freq: Two times a day (BID) | ORAL | 0 refills | Status: DC

## 2021-02-17 MED ORDER — OLOPATADINE HCL 0.1 % OP SOLN: 1.0000 [drp] | Freq: Two times a day (BID) | OPHTHALMIC | 1 refills | Status: AC

## 2021-02-17 MED ORDER — VITAMIN B-12 1000 MCG PO TABS: 2000.0000 ug | ORAL_TABLET | Freq: Every day | ORAL | 3 refills | Status: DC

## 2021-02-17 MED ORDER — FOLIC ACID 1 MG PO TABS: 1.0000 mg | ORAL_TABLET | Freq: Every day | ORAL | 3 refills | Status: DC

## 2021-02-17 MED ORDER — INSULIN ASPART 100 UNIT/ML FLEXPEN: PEN_INJECTOR | SUBCUTANEOUS | 5 refills | Status: DC

## 2021-02-17 MED ORDER — FEXOFENADINE HCL 60 MG PO TABS: 60.0000 mg | ORAL_TABLET | Freq: Two times a day (BID) | ORAL | 3 refills | Status: DC

## 2021-02-17 MED ORDER — HYDROXYZINE HCL 50 MG PO TABS: 50.0000 mg | ORAL_TABLET | Freq: Three times a day (TID) | ORAL | 0 refills | Status: DC | PRN

## 2021-02-17 MED ORDER — ATORVASTATIN CALCIUM 40 MG PO TABS: 1.0000 | ORAL_TABLET | Freq: Every day | ORAL | 3 refills | Status: DC

## 2021-02-17 MED ORDER — VICTOZA 18 MG/3ML ~~LOC~~ SOPN: PEN_INJECTOR | SUBCUTANEOUS | 3 refills | Status: DC

## 2021-02-17 NOTE — Progress Notes (Signed)
Pt states she has been out of her novolog for 2 weeks   Pt states she is having pain in left leg  Pt states she had cellulitis and edema

## 2021-02-17 NOTE — Progress Notes (Signed)
Patient ID: Terri Sparks, female    DOB: 24-Jul-1976  MRN: [id number]  CC: Diabetes, Hypertension, and Follow-up (4 week )   Subjective: Terri Sparks is a 45 y.o. female who presents for chronic disease management. Her concerns today include:  HxofDM type2 with macroalbumin, CKD 3,HTN,systolic dysf without heart failure,tob dep,PVDwith hx of recurrent LLEacute limb ischemia requiring stent to the iliac artery, thrombolysis and embolectomy in the past followed by Dr. Zenia Resides vascular surgery, hyperparathyroidism s/p resection of parathryroid adenoma 2010, GERD,IDA,B12 def/folate defanxiety/dep, DUB, subst abuse (cocaine/marijuana).  She does not have medications with her today.  Seasonal allergies: Patient complains of significant allergy symptoms over the past few weeks.  Symptoms include itchy red eyes, lacrimation, sneezing, rhinorrhea and cough.  She has been taking an over-the-counter allergy pill called  One A Day Allergy OTC.  Not sure if this is generic Zyrtec or Claritin Reports not much relief with this.  Saw the clinical pharmacist while here in the office today.  Her INR was low at 1.2.  Patient tells me that she skipped taking her Coumadin for about 3 days a few weeks ago in order to get a tattoo.  Since then she resumed taking it.  Recently hospitalized again with ischemia of the left lower limb requiring thrombolytics.  Depression/anxiety: Missed appointment with her psychiatrist Dr. Toy Care.  She was declined refill on clonazepam until she is seen again in follow-up.  Patient states she has an appointment next month.  She is requesting refill on hydroxyzine in the meantime which she uses as needed for itching and to help with anxiety.  Still on Zoloft and Wellbutrin and reports she is doing well on this medications for depression.  PHQ-9 score down 10 points since October of last year.  Substance abuse: She reports that she quit all street drugs since the 26th of  last month.  Reportedly left her boyfriend of 14 years to get away from it.  HTN: Blood pressure noted to be elevated today.  She did not take amlodipine or carvedilol as yet for the morning.  DM: Reportedly checks blood sugars 3 times a day before meals.  Highest reading has been 177.  She does not have the logbook with her.  Out of NovoLog for the past 2 weeks.  She states her pharmacy had called to try to get refills from [name] unsuccessfully. -Trying to eat better.  White carbs are her weakness.  She has tried cutting back on that and salty foods.  Currently on Lantus 60 units daily.  Wants to be tried with Trulicity or Victoza again to help better control her sugars and to help with weight loss.  She has gained 12 pounds since March of this year. -Out of atorvastatin.  Supposed to be taking 40 mg daily.  F63 and folic acid deficiency: Admits that she has not been taking B12 or folate supplement.  Requests refills.    Patient Active Problem List   Diagnosis Date Noted  . Hyperlipidemia associated with type 2 diabetes mellitus ([place]) 01/08/2021  . Critical limb ischemia with history of revascularization of same extremity ([place]) 12/27/2020  . Cocaine use disorder, moderate, dependence ([place]) 07/16/2020  . Cannabis use disorder, moderate, dependence ([place]) 07/16/2020  . Homeless 11/13/2019  . Chest pain at rest 09/24/2019  . Polysubstance abuse ([place]) 09/23/2019  . Stage 3b chronic kidney disease ([place]) 07/30/2019  . Iron deficiency anemia due to chronic blood loss 06/12/2019  . Vitamin B12 deficiency 06/12/2019  .  Folate deficiency 06/12/2019  . Constipation 06/12/2019  . Prolonged QT interval   . HTN (hypertension) 06/26/2018  . Subtherapeutic international normalized ratio (INR) 06/26/2018  . Adrenal nodule ([place]) 06/21/2018  . Lung nodules 06/21/2018  . Abnormal uterine bleeding (AUB) 05/14/2018  . Ischemia of extremity 05/12/2018  . Former smoker 03/14/2018  . Thrombosis of left iliac  artery ([place]) 02/25/2018  . Thromboembolism ([place]) 02/25/2018  . Microalbuminuria 09/29/2017  . Insomnia 09/29/2017  . Anxiety and depression 04/28/2017  . 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 Iliac artery occlusion, left ([place]) 02/25/2017  . Tobacco abuse 02/25/2017  . Insulin-requiring or dependent type II diabetes mellitus ([place]) 02/25/2017  . Peripheral vascular disease of lower extremity ([place]) 02/24/2017  . GERD (gastroesophageal reflux disease)   . Hyperparathyroidism      Current Outpatient Medications on File Prior to Visit  Medication Sig Dispense Refill  . Accu-Chek FastClix Lancets MISC USE AS INSTRUCTED TO CHECK BLOOD SUGAR THREE TIMES DAILY 100 each 2  . acetaminophen (TYLENOL) 500 MG tablet Take 2,000 mg by mouth daily as needed (for headaches).     Marland Kitchen albuterol (VENTOLIN HFA) 108 (90 Base) MCG/ACT inhaler INHALE 2 PUFFS INTO THE LUNGS EVERY 6 (SIX) HOURS AS NEEDED FOR WHEEZING OR SHORTNESS OF BREATH. 8.5 g 2  . amLODipine (NORVASC) 5 MG tablet Take 1 tablet (5 mg total) by mouth daily. 30 tablet 11  . aspirin 81 MG EC tablet TAKE 1 TABLET (81 MG TOTAL) BY MOUTH DAILY. SWALLOW WHOLE. (Patient taking differently: Take by mouth 5 (five) times daily. SWALLOW WHOLE.) 90 tablet 3  . atorvastatin (LIPITOR) 10 MG tablet TAKE ONE TABLET BY MOUTH ONCE DAILY 30 tablet 3  . atorvastatin (LIPITOR) 40 MG tablet TAKE 1 TABLET (40 MG TOTAL) BY MOUTH DAILY. 90 tablet 3  . Blood Glucose Monitoring Suppl (ACCU-CHEK GUIDE ME) w/Device KIT USE AS INSTRUCTED TO CHECK BLOOD SUGAR THREE TIMES DAILY (Patient not taking: Reported on 02/17/2021) 1 kit 0  . buPROPion (WELLBUTRIN SR) 150 MG 12 hr tablet TAKE ONE TABLET BY MOUTH TWICE DAILY 60 tablet 1  . carvedilol (COREG) 3.125 MG tablet Take 1 tablet (3.125 mg total) by mouth 2 (two) times daily. 60 tablet 6  . Cholecalciferol (VITAMIN D-3 PO) Take 1 capsule by mouth daily.    . clonazePAM (KLONOPIN) 0.5 MG tablet TAKE 1 TABLET (0.5 MG TOTAL) BY  MOUTH 2 (TWO) TIMES DAILY. 60 tablet 0  . ferrous sulfate 325 (65 FE) MG tablet Take 1 tablet (325 mg total) by mouth daily with breakfast. (Patient not taking: Reported on 02/17/2021) 30 tablet 3  . folic acid (FOLVITE) 1 MG tablet Take 1 tablet (1 mg total) by mouth daily. (Patient not taking: Reported on 02/17/2021) 30 tablet 3  . gabapentin (NEURONTIN) 400 MG capsule TAKE 1 CAPSULE (400 MG TOTAL) BY MOUTH 3 (THREE) TIMES DAILY. 90 capsule 4  . glucose blood (ACCU-CHEK GUIDE) test strip USE AS INSTRUCTED TO CHECK BLOOD SUGAR THREE TIMES DAILY 100 each 2  . hydrOXYzine (VISTARIL) 50 MG capsule TAKE 1 TABLET (50 MG TOTAL) BY MOUTH 3 (THREE) TIMES DAILY AS NEEDED FOR ITCHING OR ANXIETY. 90 capsule 4  . insulin aspart (NOVOLOG) 100 UNIT/ML FlexPen CBG 70 - 120:             0 units  CBG 121 - 150: 2 units  CBG 151 - 200: 3 units  CBG 201 - 250: 5 units  CBG 251 - 300: 8  units  CBG 301 - 350: 11 units  CBG 351 - 400: 15 units  CBG > 400:                 Call Primary provider 15 mL 0  . insulin glargine (LANTUS SOLOSTAR) 100 UNIT/ML Solostar Pen Inject 60 Units into the skin daily. 15 mL 2  . Insulin Pen Needle 32G X 4 MM MISC USE AS DIRECTED (Patient not taking: Reported on 02/17/2021) 100 each 0  . Insulin Syringes, Disposable, U-100 1 ML MISC Use as directed (Patient not taking: Reported on 02/17/2021) 100 each 0  . loratadine (CLARITIN) 10 MG tablet Take 1 tablet (10 mg total) by mouth daily as needed for allergies. (Patient not taking: Reported on 02/17/2021) 30 tablet 2  . megestrol (MEGACE) 40 MG tablet Take 1 tablet (40 mg total) by mouth 2 (two) times daily. Can increase to two tablets twice a day in the event of heavy bleeding (Patient not taking: Reported on 02/17/2021) 60 tablet 5  . methocarbamol (ROBAXIN) 500 MG tablet Take 1 tablet (500 mg total) by mouth 2 (two) times daily. (Patient not taking: Reported on 02/17/2021) 20 tablet 0  . Multiple Vitamins-Minerals (ONE-A-DAY WOMENS PO) Take 1  tablet by mouth daily.    . nicotine (NICODERM CQ - DOSED IN MG/24 HOURS) 14 mg/24hr patch PLACE 1 PATCH (14 MG TOTAL) ONTO THE SKIN DAILY. 28 patch 2  . nicotine (NICODERM CQ - DOSED IN MG/24 HOURS) 21 mg/24hr patch Place 1 patch (21 mg total) onto the skin daily. 28 patch 0  . nicotine (NICODERM CQ - DOSED IN MG/24 HOURS) 21 mg/24hr patch PLACE 1 PATCH (21 MG TOTAL) ONTO THE SKIN DAILY. 28 patch 0  . nitroGLYCERIN (NITROSTAT) 0.4 MG SL tablet PLACE 1 TABLET (0.4 MG TOTAL) UNDER THE TONGUE EVERY 5 (FIVE) MINUTES AS NEEDED FOR CHEST PAIN. 25 tablet 2  . ondansetron (ZOFRAN) 4 MG tablet Take 1 tablet (4 mg total) by mouth every 8 (eight) hours as needed for nausea or vomiting. 20 tablet 0  . oxyCODONE (OXY IR/ROXICODONE) 5 MG immediate release tablet TAKE 1 TABLET (5 MG TOTAL) BY MOUTH EVERY FOUR HOURS AS NEEDED FOR SEVERE PAIN. (Patient not taking: Reported on 02/17/2021) 30 tablet 0  . oxyCODONE-acetaminophen (PERCOCET) 5-325 MG tablet Take 1 tablet by mouth every 6 (six) hours as needed for severe pain. (Patient not taking: Reported on 02/17/2021) 20 tablet 0  . sertraline (ZOLOFT) 100 MG tablet Take 1 tablet (100 mg total) by mouth daily. 30 tablet 3  . sucralfate (CARAFATE) 1 GM/10ML suspension Take 10 mLs (1 g total) by mouth 2 (two) times daily with breakfast and lunch. 420 mL 0  . TRUEPLUS INSULIN SYRINGE 31G X 5/16" 1 ML MISC USE AS DIRECTED 100 each 3  . vitamin B-12 (CYANOCOBALAMIN) 1000 MCG tablet Take 2 tablets (2,000 mcg total) by mouth daily. 30 tablet 3  . warfarin (COUMADIN) 5 MG tablet Take 1-2 tablets (5-10 mg total) by mouth See admin instructions. Take 5 mg by mouth at NOON on Sun/Tues/Thurs/Sat and 10 mg on Mon/Wed/Fri 30 tablet 0  . warfarin (COUMADIN) 5 MG tablet TAKE 5 MG BY MOUTH AT NOON ON SUN/TUES/THURS/SAT AND 10 MG ON MON/WED/FRI 40 tablet 0   No current facility-administered medications on file prior to visit.    Allergies  Allergen Reactions  . Ciprofloxacin Hcl Hives   . Dilaudid [Hydromorphone Hcl] Shortness Of Breath and Other (See Comments)    "Asystole,"  per pt report  . Macrobid [Nitrofurantoin Macrocrystal] Hives, Shortness Of Breath and Rash  . Other Hives, Shortness Of Breath and Rash    NO "-CILLINS"!!!  . Penicillins Shortness Of Breath    Had had cephalosporins without incident Has patient had a PCN reaction causing immediate rash, facial/tongue/throat swelling, SOB or lightheadedness with hypotension: Yes Has patient had a PCN reaction causing severe rash involving mucus membranes or skin necrosis: Unk Has patient had a PCN reaction that required hospitalization: Unk Has patient had a PCN reaction occurring within the last 10 years: No If all of the above answers are "NO", then may proceed with Cephalosporin use.   . Sulfa Antibiotics Hives, Shortness Of Breath and Rash  . Lexapro [Escitalopram Oxalate] Other (See Comments)    "I just did not like it."    Social History   Socioeconomic History  . Marital status: Single    Spouse name: Not on file  . Number of children: Not on file  . Years of education: Not on file  . Highest education level: Not on file  Occupational History  . Not on file  Tobacco Use  . Smoking status: Current Some Day Smoker    Packs/day: 0.50    Years: 15.00    Pack years: 7.50    Types: Cigarettes  . Smokeless tobacco: Never Used  Vaping Use  . Vaping Use: Never used  Substance and Sexual Activity  . Alcohol use: Yes    Comment: occassionally  . Drug use: Yes    Types: Marijuana, Cocaine    Comment: once daily- last time used was night of 02/28/2020  . Sexual activity: Yes    Partners: Male    Birth control/protection: Other-see comments    Comment: BTL  Other Topics Concern  . Not on file  Social History Narrative  . Not on file   Social Determinants of Health   Financial Resource Strain: Not on file  Food Insecurity: Not on file  Transportation Needs: Not on file  Physical Activity: Not  on file  Stress: Not on file  Social Connections: Not on file  Intimate Partner Violence: Not on file    Family History  Problem Relation Age of Onset  . Depression Mother   . Diabetes Father   . Heart failure Father     Past Surgical History:  Procedure Laterality Date  . ABDOMINAL AORTOGRAM W/LOWER EXTREMITY N/A 11/14/2017   Procedure: ABDOMINAL AORTOGRAM W/LOWER EXTREMITY;  Surgeon: Waynetta Sandy, MD;  Location: [facility];  Service: Cardiovascular;  Laterality: N/A;  . ANGIOPLASTY ILLIAC ARTERY Left 02/25/2018   Procedure: BALLOON ANGIOPLASTY LEFT ILIAC ARTERY;  Surgeon: Conrad C[place], MD;  Location: [facility];  Service: Vascular;  Laterality: Left;  . APPLICATION OF WOUND VAC Left 03/03/2018   Procedure: APPLICATION OF WOUND VAC;  Surgeon: Angelia Mould, MD;  Location: [facility];  Service: Vascular;  Laterality: Left;  . CYSTOSCOPY W/ URETERAL STENT PLACEMENT Right 06/27/2018   Procedure: CYSTOSCOPY WITH RETROGRADE PYELOGRAM/URETERAL DOUBLE J STENT PLACEMENT;  Surgeon: Ceasar Mons, MD;  Location: [place];  Service: Urology;  Laterality: Right;  . CYSTOSCOPY WITH RETROGRADE PYELOGRAM, URETEROSCOPY AND STENT PLACEMENT Right 04/03/2020   Procedure: CYSTOSCOPY WITH RIGHT  RETROGRADE PYELOGRAM, URETEROSCOPY WITH HOLMIUM LASER AND STENT EXCHANGE PLACEMENT;  Surgeon: Ardis Hughs, MD;  Location: [facility];  Service: Urology;  Laterality: Right;  . CYSTOSCOPY WITH STENT PLACEMENT N/A 02/23/2019   Procedure: CYSTOSCOPY WITH RIGHT URETERAL STENT  EXCHANGE/ LEFT URETERAL STENT PLACEMENT;  Surgeon: Ceasar Mons, MD;  [facility];  Service: Urology;  Laterality: N/A;  . CYSTOSCOPY WITH STENT PLACEMENT Right 06/11/2019   Procedure: CYSTOSCOPY,RIGHT RETROGRADE WITH STENT PLACEMENT;  Surgeon: Irine Seal, MD;  [facility];  Service: Urology;  Laterality: Right;  . CYSTOSCOPY WITH STENT PLACEMENT Right 03/03/2020   Procedure: CYSTOSCOPY WITH STENT  PLACEMENT retroagrade pylerogram;  Surgeon: Ardis Hughs, MD;  [facility];  Service: Urology;  Laterality: Right;  . CYSTOSCOPY/RETROGRADE/URETEROSCOPY/STONE EXTRACTION WITH BASKET Bilateral 05/01/2019   Procedure: CYSTOSCOPY/URETEROSCOPY/STONE EXTRACTION / LASER LITHOTRIPSY, BILATERAL URETEROSCOPY WITH URETERAL STONE EXTRACTION AND STENT EXCHANGE;  Surgeon: Ceasar Mons, MD;  Location: [facility];  Service: Urology;  Laterality: Bilateral;  . CYSTOSCOPY/URETEROSCOPY/HOLMIUM LASER/STENT PLACEMENT Right 06/27/2019   Procedure: CYSTOSCOPY/URETEROSCOPY/HOLMIUM LASER/STENT EXCHANGE;  Surgeon: Ceasar Mons, MD;  [facility];  Service: Urology;  Laterality: Right;  . EMBOLECTOMY Left 02/24/2017   Procedure: Left Lower Extremity Embolectomy and Angiogram.;  Surgeon: Waynetta Sandy, MD;  Location: [place];  Service: Vascular;  Laterality: Left;  . INSERTION OF ILIAC STENT  02/24/2017   Procedure: INSERTION OF Common ILIAC STENT;  Surgeon: Waynetta Sandy, MD;  Location: [facility];  Service: Vascular;;  . INTRAOPERATIVE ARTERIOGRAM Left 02/25/2018   Procedure: INTRA OPERATIVE ARTERIOGRAM WITH LEFT LEG RUNOFF;  Surgeon: Conrad [place], MD;  Location: [place];  Service: Vascular;  Laterality: Left;  . LOWER EXTREMITY ANGIOGRAM Left 02/24/2017   Procedure: Aortagram, Left lower extremity Run-off;  Surgeon: Waynetta Sandy, MD;  Location: [place];  Service: Vascular;  Laterality: Left;  . LOWER EXTREMITY ANGIOGRAM Left 12/28/2020   Procedure: AORTOGRAM, LEFT LOWER EXTREMITY ANGIOGRAM, THROMBOLYSIS VIA LEFT BRACHIAL ARTERY APPROACH;  Surgeon: Cherre Robins, MD;  Location: [place];  Service: Vascular;  Laterality: Left;  . LOWER EXTREMITY ANGIOGRAPHY N/A 12/29/2020   Procedure: LYSIS RECHECK;  Surgeon: Waynetta Sandy, MD;  Location: [facility];  Service: Cardiovascular;  Laterality: N/A;  . PATCH ANGIOPLASTY Left 02/25/2018    Procedure: PATCH ANGIOPLASTY LEFT SUPERFICIAL FEMORAL ARTERY WITH BOVINE PATCH;  Surgeon: Conrad [place], MD;  Location: [place];  Service: Vascular;  Laterality: Left;  . PERCUTANEOUS VENOUS THROMBECTOMY,LYSIS WITH INTRAVASCULAR ULTRASOUND (IVUS) Left 05/12/2018   Procedure: MECHANICAL THROMBECTOMY LEFT LEG, BALLOON ANGIOPLASTY LEFT ANTERIOR TIBIAL ARTERY, AORTOGRAM WITH LEFT LEG RUNOFF;  Surgeon: Serafina Mitchell, MD;  Location: [place];  Service: Vascular;  Laterality: Left;  . PERIPHERAL VASCULAR THROMBECTOMY  12/29/2020   Procedure: PERIPHERAL VASCULAR THROMBECTOMY;  Surgeon: Waynetta Sandy, MD;  Location: [facility];  Service: Cardiovascular;;  . 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 THROMBECTOMY FEMORAL ARTERY Left 02/25/2018   Procedure: LEFT ILIAC AND POPLITEAL ARTERY THROMBECTOMY;  Surgeon: Conrad Volcano, MD;  Location: [place];  Service: Vascular;  Laterality: Left;  . TUBAL LIGATION    . WOUND DEBRIDEMENT Left 03/03/2018   Procedure: DEBRIDEMENT WOUND;  Surgeon: Angelia Mould, MD;  Location: [place];  Service: Vascular;  Laterality: Left;  . WOUND EXPLORATION Left 03/03/2018   Procedure: WOUND EXPLORATION;  Surgeon: Angelia Mould, MD;  Location: [facility];  Service: Vascular;  Laterality: Left;    ROS: Review of Systems Negative except as stated above  PHYSICAL EXAM: BP (!) 142/83   Pulse 91   Resp 16   Wt 277 lb (125.6 kg)   SpO2 98%   BMI 43.38 kg/m   Wt  Readings from Last 3 Encounters:  02/17/21 277 lb (125.6 kg)  12/30/20 265 lb 6.9 oz (120.4 kg)  07/29/20 266 lb 3.2 oz (120.7 kg)    Physical Exam  General appearance - alert, well appearing, and in no distress Mental status -patient with pressured speech at times and jumps from one idea to another. Eyes -mild to moderate conjunctival injection of the left eye.  No photophobia.  No drainage noted.  EOMI.  She constantly rubs her eyes Nose -moderate enlargement of nasal turbinates. Mouth -  mucous membranes moist, pharynx normal without lesions Neck - supple, no significant adenopathy Chest - clear to auscultation, no wheezes, rales or rhonchi, symmetric air entry Heart - normal rate, regular rhythm, normal S1, S2, no murmurs, rubs, clicks or gallops  Depression screen [facility] 2/9 02/17/2021 07/29/2020 05/29/2020  Decreased Interest _0 Down, Depressed, Hopeless _1 PHQ - 2 Score _2 Altered sleeping _3 Tired, decreased energy _4 Change in appetite _5 Feeling bad or failure about yourself  _6 Trouble concentrating _7 Moving slowly or fidgety/restless _8 Suicidal thoughts 1 0 3  PHQ-9 Score _9 Some recent data might be hidden    CMP Latest Ref Rng & Units 01/02/2021 01/01/2021 12/30/2020  Glucose 70 - 99 mg/dL 287(H) 256(H) 195(H)  BUN 6 - 20 mg/dL _10 Creatinine 0.44 - 1.00 mg/dL 1.97(H) 1.92(H) 1.44(H)  Sodium 135 - 145 mmol/L 131(L) 130(L) 133(L)  Potassium 3.5 - 5.1 mmol/L 4.6 4.8 4.3  Chloride 98 - 111 mmol/L 101 101 104  CO2 22 - 32 mmol/L _11 Calcium 8.9 - 10.3 mg/dL 9.2 9.4 8.8(L)  Total Protein 6.5 - 8.1 g/dL - - -  Total Bilirubin 0.3 - 1.2 mg/dL - - -  Alkaline Phos 38 - 126 U/L - - -  AST 15 - 41 U/L - - -  ALT 0 - 44 U/L - - -   Lipid Panel     Component Value Date/Time   CHOL 208 (H) 07/29/2020 1528   TRIG 381 (H) 07/29/2020 1528   HDL 40 07/29/2020 1528   CHOLHDL 5.2 (H) 07/29/2020 1528   CHOLHDL 6.5 06/27/2018 0126   VLDL 47 (H) 06/27/2018 0126   LDLCALC 103 (H) 07/29/2020 1528    CBC    Component Value Date/Time   WBC 9.7 01/04/2021 0155   RBC 4.04 01/04/2021 0155   HGB 10.4 (L) 01/04/2021 0155   HGB 14.6 01/21/2020 1603   HCT 33.9 (L) 01/04/2021 0155   HCT 44.1 01/21/2020 1603   PLT 399 01/04/2021 0155   PLT 490 (H) 01/27/2018 1157   MCV 83.9 01/04/2021 0155   MCV 89 01/21/2020 1603   MCH 25.7 (L) 01/04/2021 0155   MCHC 30.7 01/04/2021 0155   RDW 15.9 (H) 01/04/2021 0155   RDW 13.5  01/21/2020 1603   LYMPHSABS 3.0 12/27/2020 1113   LYMPHSABS 3.1 01/21/2020 1603   MONOABS 0.8 12/27/2020 1113   EOSABS 0.4 12/27/2020 1113   EOSABS 0.4 01/21/2020 1603   BASOSABS 0.1 12/27/2020 1113   BASOSABS 0.1 01/21/2020 1603   Results for orders placed or performed in visit on 02/17/21  POCT glucose (manual entry)  Result Value Ref Range   POC Glucose 203 (A) 70 - 99 mg/dl  INR  Result Value Ref Range   INR  1.2 (A) 2.0 - 3.0   Lab Results  Component Value Date   HGBA1C 13.5 (H) 12/27/2020    ASSESSMENT AND PLAN:  1. Type 2 diabetes mellitus with morbid obesity ([place]) Blood sugars by her report have improved. Continue Lantus and NovoLog. We will give a trial of Victoza.  I went over with her how the medication works.  Advised that it can cause some nausea given the fact that it slows down gastric emptying.  Medication can also sometimes cause pancreatitis.  If she develops persistent nausea or develops vomiting or epigastric abdominal pain, she should stop the medication and let me know. Dietary counseling given. Refill atorvastatin. - POCT glucose (manual entry) - insulin aspart (NOVOLOG) 100 UNIT/ML FlexPen; CBG 70 - 120:             0 units  CBG 121 - 150: 2 units  CBG 151 - 200: 3 units  CBG 201 - 250: 5 units  CBG 251 - 300: 8 units  CBG 301 - 350: 11 units  CBG 351 - 400: 15 units  CBG > 400:                 Call Primary provider  Dispense: 15 mL; Refill: 5 - atorvastatin (LIPITOR) 40 MG tablet; TAKE 1 TABLET (40 MG TOTAL) BY MOUTH DAILY.  Dispense: 90 tablet; Refill: 3 - liraglutide (VICTOZA) 18 MG/3ML SOPN; Start 0.37m SQ once a day for 7 days, then increase to 1.215monce a day  Dispense: 6 mL; Refill: 3 - Basic Metabolic Panel  2. Hypertension associated with diabetes (H[place]eeNot at goal.  She has not taken meds as yet for today.  Encouraged her to take the medicines every day as prescribed.  3. Seasonal allergic rhinitis due to pollen We will put her on  Allegra and Patanol eyedrop.  Advised patient that if her insurance does not pay for the Allegra, it can be purchased over-the-counter. - fexofenadine (ALLEGRA) 60 MG tablet; Take 1 tablet (60 mg total) by mouth 2 (two) times daily.  Dispense: 60 tablet; Refill: 3 - olopatadine (PATANOL) 0.1 % ophthalmic solution; Place 1 drop into both eyes 2 (two) times daily.  Dispense: 5 mL; Refill: 1  4. Major depressive disorder, recurrent episode with anxious distress (HCC) PHQ-9 score has decreased by a significant amount.  She will continue Zoloft and Wellbutrin.  Followed by behavioral health.  Encouraged her to keep her upcoming appointment next month. - hydrOXYzine (ATARAX/VISTARIL) 50 MG tablet; Take 1 tablet (50 mg total) by mouth 3 (three) times daily as needed.  Dispense: 60 tablet; Refill: 0  5. Hypercoagulable state (HCHuntington WoodsStrongly advised compliance with Coumadin given her recurrent episodes of acute ischemic limb - INR  6. Thromboembolism (HCSouth CarthageSee #5 above. Seen by clinical pharmacist today and told how to adjust her Coumadin.  She will follow-up with him again in anticoagulation clinic. - INR  7. Polysubstance abuse (HCClear SpringCommended on quitting.  Encouraged her to remain free of all street drugs  8. Vitamin B1V40eficiency - folic acid (FOLVITE) 1 MG tablet; Take 1 tablet (1 mg total) by mouth daily.  Dispense: 30 tablet; Refill: 3 - vitamin B-12 (CYANOCOBALAMIN) 1000 MCG tablet; Take 2 tablets (2,000 mcg total) by mouth daily.  Dispense: 30 tablet; Refill: 3     Patient was given the opportunity to ask questions.  Patient verbalized understanding of the plan and was able to repeat key elements of the plan.   No orders  of the defined types were placed in this encounter.    Requested Prescriptions    No prescriptions requested or ordered in this encounter    No follow-ups on file.  Karle Plumber, MD, FACP

## 2021-02-17 NOTE — Telephone Encounter (Signed)
PA FOR VICTOZA APPROVED UNTIL 02/17/22

## 2021-02-17 NOTE — Telephone Encounter (Signed)
Terri Sparks,   Are we able to start a PA for patient's Victoza?

## 2021-02-18 ENCOUNTER — Telehealth: Payer: Self-pay | Admitting: Internal Medicine

## 2021-02-18 LAB — BASIC METABOLIC PANEL
BUN/Creatinine Ratio: 11 (ref 9–23)
BUN: 19 mg/dL (ref 6–24)
CO2: 20 mmol/L (ref 20–29)
Calcium: 10.1 mg/dL (ref 8.7–10.2)
Chloride: 103 mmol/L (ref 96–106)
Creatinine, Ser: 1.78 mg/dL — ABNORMAL HIGH (ref 0.57–1.00)
Glucose: 66 mg/dL (ref 65–99)
Potassium: 4.4 mmol/L (ref 3.5–5.2)
Sodium: 137 mmol/L (ref 134–144)
eGFR: 35 mL/min/{1.73_m2} — ABNORMAL LOW (ref >59)

## 2021-02-18 NOTE — Telephone Encounter (Signed)
Lurena Joiner can you clarify please

## 2021-02-18 NOTE — Telephone Encounter (Signed)
Called to speak to the nurse regarding the directions for patient's script for Novalog.  Pharmacy said they need clarification.  Please call to discuss at [phone]

## 2021-02-19 NOTE — Telephone Encounter (Signed)
Call placed to pharmacy. They received the sliding scale rx but we did not indicate frequency on the rx. I approved the addition of "TID before meals" to her sliding scale instructions.

## 2021-02-23 ENCOUNTER — Telehealth: Payer: Self-pay

## 2021-02-23 NOTE — Telephone Encounter (Signed)
Contacted pt to go over lab results pt didn't answer went to busy signal

## 2021-02-24 ENCOUNTER — Ambulatory Visit: Payer: Medicaid Other | Admitting: Pharmacist

## 2021-03-03 ENCOUNTER — Other Ambulatory Visit: Payer: Self-pay | Admitting: Internal Medicine

## 2021-03-12 ENCOUNTER — Encounter (HOSPITAL_COMMUNITY): Payer: Self-pay | Admitting: Psychiatry

## 2021-03-12 ENCOUNTER — Telehealth (INDEPENDENT_AMBULATORY_CARE_PROVIDER_SITE_OTHER): Payer: Medicaid Other | Admitting: Psychiatry

## 2021-03-12 ENCOUNTER — Other Ambulatory Visit: Payer: Self-pay

## 2021-03-12 DIAGNOSIS — F339 Major depressive disorder, recurrent, unspecified: Secondary | ICD-10-CM | POA: Diagnosis not present

## 2021-03-12 DIAGNOSIS — F411 Generalized anxiety disorder: Secondary | ICD-10-CM | POA: Diagnosis not present

## 2021-03-12 DIAGNOSIS — F1221 Cannabis dependence, in remission: Secondary | ICD-10-CM | POA: Diagnosis not present

## 2021-03-12 DIAGNOSIS — F1421 Cocaine dependence, in remission: Secondary | ICD-10-CM | POA: Diagnosis not present

## 2021-03-12 MED ORDER — BUPROPION HCL ER (SR) 150 MG PO TB12: 150.0000 mg | ORAL_TABLET | Freq: Two times a day (BID) | ORAL | 2 refills | Status: DC

## 2021-03-12 MED ORDER — CLONAZEPAM 0.5 MG PO TABS: 0.5000 mg | ORAL_TABLET | Freq: Two times a day (BID) | ORAL | 2 refills | Status: DC

## 2021-03-12 MED ORDER — SERTRALINE HCL 100 MG PO TABS: 100.0000 mg | ORAL_TABLET | Freq: Every day | ORAL | 2 refills | Status: DC

## 2021-03-12 MED ORDER — HYDROXYZINE PAMOATE 50 MG PO CAPS: 50.0000 mg | ORAL_CAPSULE | Freq: Three times a day (TID) | ORAL | 2 refills | Status: DC | PRN

## 2021-03-12 NOTE — Progress Notes (Signed)
[facility] OP Progress Note  Virtual Visit via Telephone Note  I connected with Terri Sparks on 03/12/21 at 10:30 AM EDT by telephone and verified that I am speaking with the correct person using two identifiers.  Location: Patient: home Provider: Clinic   I discussed the limitations, risks, security and privacy concerns of performing an evaluation and management service by telephone and the availability of in person appointments. I also discussed with the patient that there may be a patient responsible charge related to this service. The patient expressed understanding and agreed to proceed.   I provided 17 minutes of non-face-to-face time during this encounter.   Patient Identification: Terri Sparks MRN:  [id number] Date of Evaluation:  03/12/2021   Chief Complaint:  " I need my Klonopin."   Visit Diagnosis:    ICD-10-CM   1. Major depressive disorder, recurrent episode with anxious distress (HCC)  F33.9 buPROPion (WELLBUTRIN SR) 150 MG 12 hr tablet    sertraline (ZOLOFT) 100 MG tablet    2. Anxiety state  F41.1 clonazePAM (KLONOPIN) 0.5 MG tablet    sertraline (ZOLOFT) 100 MG tablet    hydrOXYzine (VISTARIL) 50 MG capsule    3. Cocaine use disorder, moderate, in early remission ([place])  F14.21     4. 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    Patient was seen once for initial evaluation October 2021.  Patient had no-showed for her subsequent follow-up appointments.    Today, patient stated that she has realized the hardware that she really needs to continue taking her medications as they help her immensely.  She stated that she has stopped  using cocaine over the last few months however she really needs to continue taking clonazepam to control her anxiety.  She asked if her dose of clonazepam could be increased any further.  Writer informed her that given her history of polysubstance abuse writer would recommend going up on the dose.  Patient stated that clonazepam 0.5 mg twice a day is effective on most of the days but there are some days when she feels very anxious and she takes an extra dose. Writer asked her about going up on the dose of sertraline and she stated that she would like to continue the same dose of sertraline for now however did ask about hydroxyzine.  She stated that she does not use hydroxyzine consistently.  However whenever she does use it it helps her. Provider recommended that she continue the same dose of clonazepam for now and does not overuse it and uses hydroxyzine for breakthrough anxiety.  Patient stated that she agrees with this plan.  She requested for hydroxyzine capsules to be sent to her pharmacy instead of tablet form.  She stated that she is trying hard to stay on the right track without relapsing on cocaine or any other illicit substances.  Past Psychiatric History: MDD, anxiety, polysubstance abuse  Previous Psychotropic Medications: Yes   Substance Abuse History in the last 12 months:  Yes.    Consequences of Substance Abuse: Medical Consequences:  Needs medical care  Past Medical History:  Past Medical History:  Diagnosis Date   Anxiety    Depression  Diabetes ([place])    Type 2   GERD (gastroesophageal reflux disease)    History of blood clots    ,DVT-left leg, and early 2000, had blood clot behind left breast   History of kidney stones    Hypercalcemia    Hyperparathyroidism    Hypertension    had been on Lisinopril and PCP took her off it 4 months ago   Iron deficiency anemia    Left thyroid nodule    Mass of right ovary    Nephrolithiasis    PAOD (peripheral arterial occlusive  disease) ([place])    Peripheral vascular disease (HCC)    Prolonged Q-T interval on ECG 04/19/2019   Renal calculi    Substance abuse ([place])    Tooth loose    top tooth from the right  is loose     Past Surgical History:  Procedure Laterality Date   ABDOMINAL AORTOGRAM W/LOWER EXTREMITY N/A 11/14/2017   Procedure: ABDOMINAL AORTOGRAM W/LOWER EXTREMITY;  Surgeon: Waynetta Sandy, MD;  Location: [facility];  Service: Cardiovascular;  Laterality: N/A;   ANGIOPLASTY ILLIAC ARTERY Left 02/25/2018   Procedure: BALLOON ANGIOPLASTY LEFT ILIAC ARTERY;  Surgeon: Conrad [place], MD;  Location: [facility];  Service: Vascular;  Laterality: Left;   APPLICATION OF WOUND VAC Left 03/03/2018   Procedure: APPLICATION OF WOUND VAC;  Surgeon: Angelia Mould, MD;  Location: [facility];  Service: Vascular;  Laterality: Left;   CYSTOSCOPY W/ URETERAL STENT PLACEMENT Right 06/27/2018   Procedure: CYSTOSCOPY WITH RETROGRADE PYELOGRAM/URETERAL DOUBLE J STENT PLACEMENT;  Surgeon: Ceasar Mons, MD;  Location: [facility];  Service: Urology;  Laterality: Right;   CYSTOSCOPY WITH RETROGRADE PYELOGRAM, URETEROSCOPY AND STENT PLACEMENT Right 04/03/2020   Procedure: CYSTOSCOPY WITH RIGHT  RETROGRADE PYELOGRAM, URETEROSCOPY WITH HOLMIUM LASER AND STENT EXCHANGE PLACEMENT;  Surgeon: Ardis Hughs, MD;  [facility];  Service: Urology;  Laterality: Right;   CYSTOSCOPY WITH STENT PLACEMENT N/A 02/23/2019   Procedure: CYSTOSCOPY WITH RIGHT URETERAL STENT EXCHANGE/ LEFT URETERAL STENT PLACEMENT;  Surgeon: Ceasar Mons, MD;  [facility];  Service: Urology;  Laterality: N/A;   CYSTOSCOPY WITH STENT PLACEMENT Right 06/11/2019   Procedure: CYSTOSCOPY,RIGHT RETROGRADE WITH STENT PLACEMENT;  Surgeon: Irine Seal, MD;  [facility];  Service: Urology;  Laterality: Right;   CYSTOSCOPY WITH STENT PLACEMENT Right 03/03/2020   Procedure: CYSTOSCOPY WITH STENT PLACEMENT retroagrade pylerogram;   Surgeon: Ardis Hughs, MD;  [facility];  Service: Urology;  Laterality: Right;   CYSTOSCOPY/RETROGRADE/URETEROSCOPY/STONE EXTRACTION WITH BASKET Bilateral 05/01/2019   Procedure: CYSTOSCOPY/URETEROSCOPY/STONE EXTRACTION / LASER LITHOTRIPSY, BILATERAL URETEROSCOPY WITH URETERAL STONE EXTRACTION AND STENT EXCHANGE;  Surgeon: Ceasar Mons, MD;  Location: [facility];  Service: Urology;  Laterality: Bilateral;   CYSTOSCOPY/URETEROSCOPY/HOLMIUM LASER/STENT PLACEMENT Right 06/27/2019   Procedure: CYSTOSCOPY/URETEROSCOPY/HOLMIUM LASER/STENT EXCHANGE;  Surgeon: Ceasar Mons, MD;  [facility];  Service: Urology;  Laterality: Right;   EMBOLECTOMY Left 02/24/2017   Procedure: Left Lower Extremity Embolectomy and Angiogram.;  Surgeon: Waynetta Sandy, MD;  Location: [place];  Service: Vascular;  Laterality: Left;   INSERTION OF ILIAC STENT  02/24/2017   Procedure: INSERTION OF Common ILIAC STENT;  Surgeon: Waynetta Sandy, MD;  Location: [facility];  Service: Vascular;;   INTRAOPERATIVE ARTERIOGRAM Left 02/25/2018   Procedure: INTRA OPERATIVE ARTERIOGRAM WITH LEFT LEG RUNOFF;  Surgeon: Conrad Osgood, MD;  Location: [facility];  Service: Vascular;  Laterality: Left;   LOWER EXTREMITY ANGIOGRAM Left  02/24/2017   Procedure: Aortagram, Left lower extremity Run-off;  Surgeon: Waynetta Sandy, MD;  [facility];  Service: Vascular;  Laterality: Left;   LOWER EXTREMITY ANGIOGRAM Left 12/28/2020   Procedure: AORTOGRAM, LEFT LOWER EXTREMITY ANGIOGRAM, THROMBOLYSIS VIA LEFT BRACHIAL ARTERY APPROACH;  Surgeon: Cherre Robins, MD;  Location: [place];  Service: Vascular;  Laterality: Left;   LOWER EXTREMITY ANGIOGRAPHY N/A 12/29/2020   Procedure: LYSIS RECHECK;  Surgeon: Waynetta Sandy, MD;  Location: [facility];  Service: Cardiovascular;  Laterality: N/A;   PATCH ANGIOPLASTY Left 02/25/2018   Procedure: PATCH ANGIOPLASTY LEFT  SUPERFICIAL FEMORAL ARTERY WITH BOVINE PATCH;  Surgeon: Conrad [place], MD;  Location: [place];  Service: Vascular;  Laterality: Left;   PERCUTANEOUS VENOUS THROMBECTOMY,LYSIS WITH INTRAVASCULAR ULTRASOUND (IVUS) Left 05/12/2018   Procedure: MECHANICAL THROMBECTOMY LEFT LEG, BALLOON ANGIOPLASTY LEFT ANTERIOR TIBIAL ARTERY, AORTOGRAM WITH LEFT LEG RUNOFF;  Surgeon: Serafina Mitchell, MD;  Location: [place];  Service: Vascular;  Laterality: Left;   PERIPHERAL VASCULAR THROMBECTOMY  12/29/2020   Procedure: PERIPHERAL VASCULAR THROMBECTOMY;  Surgeon: Waynetta Sandy, MD;  Location: [facility];  Service: Cardiovascular;;   removal of parathyroid adenoma  5/11   THROMBECTOMY FEMORAL ARTERY Left 02/25/2018   Procedure: LEFT ILIAC AND POPLITEAL ARTERY THROMBECTOMY;  Surgeon: Conrad [place], MD;  Location: [place];  Service: Vascular;  Laterality: Left;   TUBAL LIGATION     WOUND DEBRIDEMENT Left 03/03/2018   Procedure: DEBRIDEMENT WOUND;  Surgeon: Angelia Mould, MD;  Location: [place];  Service: Vascular;  Laterality: Left;   WOUND EXPLORATION Left 03/03/2018   Procedure: WOUND EXPLORATION;  Surgeon: Angelia Mould, MD;  Location: [facility];  Service: Vascular;  Laterality: Left;    Family Psychiatric History: Mother-depression, committed suicide 8 years ago  Family History:  Family History  Problem Relation Age of Onset   Depression Mother    Diabetes Father    Heart failure Father     Social History:   Social History   Socioeconomic History   Marital status: Single    Spouse name: Not on file   Number of children: Not on file   Years of education: Not on file   Highest education level: Not on file  Occupational History   Not on file  Tobacco Use   Smoking status: Some Days    Packs/day: 0.50    Years: 15.00    Pack years: 7.50    Types: Cigarettes   Smokeless tobacco: Never  Vaping Use   Vaping Use: Never used  Substance and Sexual Activity   Alcohol use: Yes     Comment: occassionally   Drug use: Yes    Types: Marijuana, Cocaine    Comment: once daily- last time used was night of 02/28/2020   Sexual activity: Yes    Partners: Male    Birth control/protection: Other-see comments    Comment: BTL  Other Topics Concern   Not on file  Social History Narrative   Not on file   Social Determinants of Health   Financial Resource Strain: Not on file  Food Insecurity: Not on file  Transportation Needs: Not on file  Physical Activity: Not on file  Stress: Not on file  Social Connections: Not on file    Additional Social History: Lives alone, has 2 children who are very close to her.  Has a good relationship with her children and grandchildren  Allergies:   Allergies  Allergen Reactions  Ciprofloxacin Hcl Hives   Dilaudid [Hydromorphone Hcl] Shortness Of Breath and Other (See Comments)    "Asystole," per pt report   Macrobid [Nitrofurantoin Macrocrystal] Hives, Shortness Of Breath and Rash   Other Hives, Shortness Of Breath and Rash    NO "-CILLINS"!!!   Penicillins Shortness Of Breath    Had had cephalosporins without incident Has patient had a PCN reaction causing immediate rash, facial/tongue/throat swelling, SOB or lightheadedness with hypotension: Yes Has patient had a PCN reaction causing severe rash involving mucus membranes or skin necrosis: Unk Has patient had a PCN reaction that required hospitalization: Unk Has patient had a PCN reaction occurring within the last 10 years: No If all of the above answers are "NO", then may proceed with Cephalosporin use.    Sulfa Antibiotics Hives, Shortness Of Breath and Rash   Lexapro [Escitalopram Oxalate] Other (See Comments)    "I just did not like it."    Metabolic Disorder Labs: Lab Results  Component Value Date   HGBA1C 13.5 (H) 12/27/2020   MPG 340.75 12/27/2020   MPG 335.01 12/27/2020   No results found for: PROLACTIN Lab Results  Component Value Date   CHOL 208 (H) 07/29/2020    TRIG 381 (H) 07/29/2020   HDL 40 07/29/2020   CHOLHDL 5.2 (H) 07/29/2020   VLDL 47 (H) 06/27/2018   LDLCALC 103 (H) 07/29/2020   LDLCALC 87 01/21/2020   Lab Results  Component Value Date   TSH 0.983 07/30/2019    Therapeutic Level Labs: No results found for: LITHIUM No results found for: CBMZ No results found for: VALPROATE  Current Medications: Current Outpatient Medications  Medication Sig Dispense Refill   hydrOXYzine (VISTARIL) 50 MG capsule Take 1 capsule (50 mg total) by mouth 3 (three) times daily as needed for anxiety. 90 capsule 2   Accu-Chek FastClix Lancets MISC USE AS INSTRUCTED TO CHECK BLOOD SUGAR THREE TIMES DAILY 100 each 2   acetaminophen (TYLENOL) 500 MG tablet Take 2,000 mg by mouth daily as needed (for headaches).      albuterol (VENTOLIN HFA) 108 (90 Base) MCG/ACT inhaler INHALE 2 PUFFS INTO THE LUNGS EVERY 6 (SIX) HOURS AS NEEDED FOR WHEEZING OR SHORTNESS OF BREATH. 8.5 g 2   amLODipine (NORVASC) 5 MG tablet Take 1 tablet (5 mg total) by mouth daily. 30 tablet 11   aspirin 81 MG EC tablet TAKE 1 TABLET (81 MG TOTAL) BY MOUTH DAILY. SWALLOW WHOLE. (Patient taking differently: Take by mouth 5 (five) times daily. SWALLOW WHOLE.) 90 tablet 3   atorvastatin (LIPITOR) 40 MG tablet TAKE 1 TABLET (40 MG TOTAL) BY MOUTH DAILY. 90 tablet 3   Blood Glucose Monitoring Suppl (ACCU-CHEK GUIDE ME) w/Device KIT USE AS INSTRUCTED TO CHECK BLOOD SUGAR THREE TIMES DAILY (Patient not taking: Reported on 02/17/2021) 1 kit 0   buPROPion (WELLBUTRIN SR) 150 MG 12 hr tablet Take 1 tablet (150 mg total) by mouth 2 (two) times daily. 60 tablet 2   carvedilol (COREG) 3.125 MG tablet Take 1 tablet (3.125 mg total) by mouth 2 (two) times daily. 60 tablet 6   Cholecalciferol (VITAMIN D-3 PO) Take 1 capsule by mouth daily.     clonazePAM (KLONOPIN) 0.5 MG tablet Take 1 tablet (0.5 mg total) by mouth 2 (two) times daily. 60 tablet 2   ferrous sulfate 325 (65 FE) MG tablet Take 1 tablet (325  mg total) by mouth daily with breakfast. (Patient not taking: Reported on 02/17/2021) 30 tablet 3   fexofenadine (ALLEGRA)  60 MG tablet Take 1 tablet (60 mg total) by mouth 2 (two) times daily. 60 tablet 3   folic acid (FOLVITE) 1 MG tablet Take 1 tablet (1 mg total) by mouth daily. 30 tablet 3   gabapentin (NEURONTIN) 400 MG capsule TAKE 1 CAPSULE (400 MG TOTAL) BY MOUTH 3 (THREE) TIMES DAILY. 90 capsule 4   glucose blood (ACCU-CHEK GUIDE) test strip USE AS INSTRUCTED TO CHECK BLOOD SUGAR THREE TIMES DAILY 100 each 2   hydrOXYzine (ATARAX/VISTARIL) 50 MG tablet Take 1 tablet (50 mg total) by mouth 3 (three) times daily as needed. 60 tablet 0   insulin aspart (NOVOLOG) 100 UNIT/ML FlexPen CBG 70 - 120:             0 units  CBG 121 - 150: 2 units  CBG 151 - 200: 3 units  CBG 201 - 250: 5 units  CBG 251 - 300: 8 units  CBG 301 - 350: 11 units  CBG 351 - 400: 15 units  CBG > 400:                 Call Primary provider 15 mL 5   Insulin Pen Needle 32G X 4 MM MISC USE AS DIRECTED (Patient not taking: Reported on 02/17/2021) 100 each 0   Insulin Syringes, Disposable, U-100 1 ML MISC Use as directed (Patient not taking: Reported on 02/17/2021) 100 each 0   LANTUS SOLOSTAR 100 UNIT/ML Solostar Pen INJECT 60 UNITS INTO THE SKIN DAILY. 15 mL 7   liraglutide (VICTOZA) 18 MG/3ML SOPN Start 0.74m SQ once a day for 7 days, then increase to 1.24monce a day 6 mL 3   megestrol (MEGACE) 40 MG tablet Take 1 tablet (40 mg total) by mouth 2 (two) times daily. Can increase to two tablets twice a day in the event of heavy bleeding (Patient not taking: Reported on 02/17/2021) 60 tablet 5   Multiple Vitamins-Minerals (ONE-A-DAY WOMENS PO) Take 1 tablet by mouth daily.     nicotine (NICODERM CQ - DOSED IN MG/24 HOURS) 14 mg/24hr patch PLACE 1 PATCH (14 MG TOTAL) ONTO THE SKIN DAILY. 28 patch 2   nicotine (NICODERM CQ - DOSED IN MG/24 HOURS) 21 mg/24hr patch Place 1 patch (21 mg total) onto the skin daily. 28 patch 0    nicotine (NICODERM CQ - DOSED IN MG/24 HOURS) 21 mg/24hr patch PLACE 1 PATCH (21 MG TOTAL) ONTO THE SKIN DAILY. 28 patch 0   nitroGLYCERIN (NITROSTAT) 0.4 MG SL tablet PLACE 1 TABLET (0.4 MG TOTAL) UNDER THE TONGUE EVERY 5 (FIVE) MINUTES AS NEEDED FOR CHEST PAIN. 25 tablet 2   olopatadine (PATANOL) 0.1 % ophthalmic solution Place 1 drop into both eyes 2 (two) times daily. 5 mL 1   ondansetron (ZOFRAN) 4 MG tablet Take 1 tablet (4 mg total) by mouth every 8 (eight) hours as needed for nausea or vomiting. 20 tablet 0   sertraline (ZOLOFT) 100 MG tablet Take 1 tablet (100 mg total) by mouth daily. 30 tablet 2   sucralfate (CARAFATE) 1 GM/10ML suspension Take 10 mLs (1 g total) by mouth 2 (two) times daily with breakfast and lunch. 420 mL 0   TRUEPLUS INSULIN SYRINGE 31G X 5/16" 1 ML MISC USE AS DIRECTED 100 each 3   vitamin B-12 (CYANOCOBALAMIN) 1000 MCG tablet Take 2 tablets (2,000 mcg total) by mouth daily. 30 tablet 3   warfarin (COUMADIN) 5 MG tablet Take 1-2 tablets (5-10 mg total) by mouth See admin instructions.  Take 5 mg by mouth at NOON on Sun/Tues/Thurs/Sat and 10 mg on Mon/Wed/Fri 30 tablet 0   warfarin (COUMADIN) 5 MG tablet TAKE 5 MG BY MOUTH AT NOON ON SUN/TUES/THURS/SAT AND 10 MG ON MON/WED/FRI 40 tablet 0   No current facility-administered medications for this visit.    Musculoskeletal: Strength & Muscle Tone: within normal limits Gait & Station: normal Patient leans: N/A  Psychiatric Specialty Exam: Review of Systems  There were no vitals taken for this visit.There is no height or weight on file to calculate BMI.  General Appearance: Unable to assess due to phone visit  Eye Contact: Unable to assess due to phone visit  Speech:  Clear and Coherent and Normal Rate  Volume:  Normal  Mood:  Anxious  Affect:  Congruent  Thought Process:  Goal Directed and Descriptions of Associations: Intact  Orientation:  Full (Time, Place, and Person)  Thought Content:  Logical  Suicidal  Thoughts:  No  Homicidal Thoughts:  No  Memory:  Immediate;   Good Recent;   Good  Judgement:  Fair  Insight:  Fair  Psychomotor Activity:  Normal  Concentration:  Concentration: Good and Attention Span: Good  Recall:  Good  Fund of Knowledge:Good  Language: Good  Akathisia:  Negative  Handed:  Right  AIMS (if indicated):  0  Assets:  Communication Skills Desire for Improvement Financial Resources/Insurance Housing  ADL's:  Intact  Cognition: WNL  Sleep:  Fair   Screenings: GAD-7    [place] Office Visit from 02/17/2021 in [place] Office Visit from 07/29/2020 in [place] Video Visit from 05/29/2020 in Center for [facility] at [facility] Office Visit from 03/24/2020 in [place] Office Visit from 07/30/2019 in Center for [facility]  Total GAD-7 Score _0 PHQ2-9    [place] Office Visit from 02/17/2021 in [place] Office Visit from 07/29/2020 in [place] Video Visit from 05/29/2020 in Center for [place] at [facility] Office Visit from 03/24/2020 in [place] Visit from 07/30/2019 in Center for [facility]  PHQ-2 Total Score _1 PHQ-9 Total Score _2 Flowsheet Row ED to Hosp-Admission (Discharged) from 12/27/2020 in [place] ED from 12/08/2020 in [place] No Risk No Risk       Assessment and Plan: Patient reported that she needs to be maintained on her current regimen of Zoloft plus Wellbutrin along with clonazepam and hydroxyzine as this combination helps her function well.  She wanted to know if her dose of clonazepam could increase any further  however when the writer recommended that she uses hydroxyzine for breakthrough anxiety she agreed with the plan.  Patient was advised not to misuse clonazepam and only use it at the current prescribed dose.  1. Anxiety state  - clonazePAM (KLONOPIN) 0.5 MG tablet; Take 1 tablet (0.5 mg total) by mouth 2 (two) times daily.  Dispense: 60 tablet; Refill: 2 - sertraline (ZOLOFT) 100 MG tablet; Take 1 tablet (100 mg total) by mouth daily.  Dispense: 30 tablet; Refill: 2 - hydrOXYzine (VISTARIL) 50 MG capsule; Take 1 capsule (50 mg total) by  mouth 3 (three) times daily as needed for anxiety.  Dispense: 90 capsule; Refill: 2  2. Major depressive disorder, recurrent episode with anxious distress (HCC)  - buPROPion (WELLBUTRIN SR) 150 MG 12 hr tablet; Take 1 tablet (150 mg total) by mouth 2 (two) times daily.  Dispense: 60 tablet; Refill: 2 - sertraline (ZOLOFT) 100 MG tablet; Take 1 tablet (100 mg total) by mouth daily.  Dispense: 30 tablet; Refill: 2  3. Cocaine use disorder, moderate, in early remission ([place])   4. Cannabis use disorder, moderate, in early remission ([place])   Continue same regimen. Follow-up in 3 months as per patient request. Patient was made aware that her care is being transferred to a different provider due to the writer leaving the office.  [place] Crane, MD 6/9/202211:14 AM

## 2021-03-19 ENCOUNTER — Other Ambulatory Visit: Payer: Self-pay | Admitting: Internal Medicine

## 2021-03-19 NOTE — Telephone Encounter (Signed)
Future OV 04/27/21. Approved per protocol. Requested Prescriptions  Pending Prescriptions Disp Refills  . Accu-Chek Softclix Lancets lancets [Pharmacy Med Name: ACCU-CHEK SOFTCLIX LANCETS] 100 each 2    Sig: USE AS INSTRUCTED TO [place] DAILY     Endocrinology: Diabetes - Testing Supplies Passed - 03/19/2021  3:30 PM      Passed - Valid encounter within last 12 months    Recent Outpatient Visits          1 month ago Type 2 diabetes mellitus with morbid obesity ([place])   [place] Ladell [place], MD   2 months ago Essential hypertension   [place], MD   7 months ago Preoperative evaluation to rule out surgical contraindication   [place], MD   8 months ago Thromboembolism [facility])   [place], Jarome Matin, RPH-CPP   12 months ago Type 2 diabetes mellitus with microalbuminuria, with long-term current use of insulin [facility])   [place], MD      Future Appointments            In 1 month Wynetta Emery Dalbert Batman, MD [place]           . ACCU-CHEK GUIDE test strip [Pharmacy Med Name: Colp STRIP] 100 each 2    Sig: USE AS INSTRUCTED TO CHECK BLOOD SUGAR THREE TIMES DAILY     Endocrinology: Diabetes - Testing Supplies Passed - 03/19/2021  3:30 PM      Passed - Valid encounter within last 12 months    Recent Outpatient Visits          1 month ago Type 2 diabetes mellitus with morbid obesity ([place])   [place] Ladell Pier, MD   2 months ago Essential hypertension   [place], MD   7 months ago Preoperative evaluation to rule out surgical contraindication   [place], MD   8 months ago Thromboembolism [facility])   [place], Jarome Matin, RPH-CPP   12 months ago Type 2 diabetes mellitus with microalbuminuria, with long-term current use of insulin [facility])   [place], MD      Future Appointments            In 1 month Wynetta Emery, Dalbert Batman, MD [place]

## 2021-04-03 ENCOUNTER — Other Ambulatory Visit: Payer: Self-pay | Admitting: Internal Medicine

## 2021-04-03 DIAGNOSIS — F17200 Nicotine dependence, unspecified, uncomplicated: Secondary | ICD-10-CM

## 2021-04-07 ENCOUNTER — Telehealth: Payer: Self-pay | Admitting: Internal Medicine

## 2021-04-07 MED ORDER — WARFARIN SODIUM 5 MG PO TABS: ORAL_TABLET | ORAL | 0 refills | Status: DC

## 2021-04-07 NOTE — Addendum Note (Signed)
Addended by: Karle Plumber B on: 04/07/2021 09:20 PM   Modules accepted: Orders

## 2021-04-07 NOTE — Telephone Encounter (Signed)
Pt called and is also requesting to have a refill on her prescription for Warfarin that was prescribed in hospital. Please advise.

## 2021-04-09 NOTE — Telephone Encounter (Signed)
Agreed. I searched her charts and she does not have tx failure with Eliquis. She was on this last year before switching to Coumadin. Her reasoning for d/c was heavy menstrual cycles. However, benefit may outweigh risk in this setting as she is non-compliant with Coumadin Clinic follow-up.

## 2021-04-15 ENCOUNTER — Telehealth (HOSPITAL_COMMUNITY): Payer: Self-pay | Admitting: *Deleted

## 2021-04-15 NOTE — Telephone Encounter (Signed)
Called to report her Klonopin is not helping her much and she is taking 1 mg TID rather than what she is prescribed. She also reports poor sleep, she gets adequate hours but she is sleeping in the day. She had been seeing Dr Toy Care who has left this practice and she has an appt in Sept with Eddie PA. WIll inform Ludwig Clarks of her concern and one of [name] will respond back to her, but it may be she needs to come in as a walk in to be seen, or he may make changes over the phone. Will forward her concern.

## 2021-04-16 ENCOUNTER — Other Ambulatory Visit (HOSPITAL_COMMUNITY): Payer: Self-pay | Admitting: Physician Assistant

## 2021-04-16 DIAGNOSIS — F411 Generalized anxiety disorder: Secondary | ICD-10-CM

## 2021-04-16 DIAGNOSIS — G4700 Insomnia, unspecified: Secondary | ICD-10-CM

## 2021-04-16 MED ORDER — TRAZODONE HCL 50 MG PO TABS: 50.0000 mg | ORAL_TABLET | Freq: Every day | ORAL | 1 refills | Status: DC

## 2021-04-16 MED ORDER — CLONAZEPAM 0.5 MG PO TABS: 0.5000 mg | ORAL_TABLET | Freq: Three times a day (TID) | ORAL | 0 refills | Status: DC | PRN

## 2021-04-16 NOTE — Progress Notes (Signed)
Provider was contacted by Davina Poke, RN regarding medication management. Patient was contacted by providing expressing issues with her sleep and anxiety. Patient reports that she has been taking Klonopin 1 mg TID instead of 0.5 mg BID due to worsening anxiety. Patient's medication was adjusted to Klonopin 0.5 mg TID. Patient was advised to take medication as scheduled.  Patient has a past history of taking trazodone 150 mg for sleep management. Patient was recommended trazodone 50 mg at bedtime. Patient was agreeable to plan.  Patient has a follow-up appointment scheduled for 06/09/2021

## 2021-04-16 NOTE — Telephone Encounter (Signed)
Provider was contacted by Davina Poke, RN regarding medication management. Patient was contacted by providing expressing issues with her sleep and anxiety. Patient reports that she has been taking Klonopin 1 mg TID instead of 0.5 mg BID due to worsening anxiety. Patient's medication was adjusted to Klonopin 0.5 mg TID. Patient was advised to take medication as scheduled.  Patient has a past history of taking trazodone 150 mg for sleep management. Patient was recommended trazodone 50 mg at bedtime. Patient was agreeable to plan.

## 2021-04-27 ENCOUNTER — Other Ambulatory Visit: Payer: Self-pay | Admitting: Critical Care Medicine

## 2021-04-27 ENCOUNTER — Other Ambulatory Visit: Payer: Self-pay

## 2021-04-27 ENCOUNTER — Ambulatory Visit: Payer: Medicaid Other | Attending: Internal Medicine | Admitting: Internal Medicine

## 2021-04-27 DIAGNOSIS — Z91199 Patient's noncompliance with other medical treatment and regimen due to unspecified reason: Secondary | ICD-10-CM

## 2021-04-27 DIAGNOSIS — Z5329 Procedure and treatment not carried out because of patient's decision for other reasons: Secondary | ICD-10-CM

## 2021-04-27 NOTE — Telephone Encounter (Signed)
  Notes to clinic:  medication just filled today Review for future refill    Requested Prescriptions  Pending Prescriptions Disp Refills   PROAIR HFA 108 (90 Base) MCG/ACT inhaler [Pharmacy Med Name: PROAIR HFA 108 (90 BASE) MCG/ACT INHALATION AEROSOL SOLUTION] 8.5 g 2    Sig: INHALE TWO PUFFS BY MOUTH EVERY SIX HOURS AS NEEDED FOR WHEEZING OR SHORTNESS OF BREATH      Pulmonology:  Beta Agonists Failed - 04/27/2021 12:20 PM      Failed - One inhaler should last at least one month. If the patient is requesting refills earlier, contact the patient to check for uncontrolled symptoms.      Passed - Valid encounter within last 12 months    Recent Outpatient Visits           Today No-show for appointment   [place] Karle Plumber B, MD   2 months ago Type 2 diabetes mellitus with morbid obesity [facility])   [place], MD   3 months ago Essential hypertension   [place], MD   9 months ago Preoperative evaluation to rule out surgical contraindication   [place], MD   9 months ago Thromboembolism [facility])   [place], RPH-CPP

## 2021-04-27 NOTE — Progress Notes (Signed)
Patient no showed for this telephone/video encounter.  I left a message on her voicemail at 9:28 AM informing her of who I am and requesting callback. I will have our schedulers reach out to her to reschedule this appointment.  We will have them reschedule her appointment with me in 1 month and with the clinical pharmacist for INR check in 2 weeks.

## 2021-04-30 ENCOUNTER — Telehealth: Payer: Self-pay | Admitting: Internal Medicine

## 2021-04-30 NOTE — Telephone Encounter (Signed)
Called patient and left vm to call [phone] to get schedule for an appointment with Dr. Wynetta Emery as well as a Coumadin appt with Lurena Joiner

## 2021-04-30 NOTE — Telephone Encounter (Signed)
-----   Message from Ladell Pier, MD sent at 04/27/2021 12:05 PM EDT ----- Pt no-showed appt.  Please reschedule with me in 1 mth and with [facility] in 2 wks for Coumadin/blood thinner level check.

## 2021-05-11 ENCOUNTER — Telehealth: Payer: Self-pay | Admitting: Internal Medicine

## 2021-05-11 ENCOUNTER — Other Ambulatory Visit (HOSPITAL_COMMUNITY): Payer: Self-pay | Admitting: Psychiatry

## 2021-05-11 DIAGNOSIS — F411 Generalized anxiety disorder: Secondary | ICD-10-CM

## 2021-05-11 NOTE — Telephone Encounter (Signed)
...   Medicaid Managed Care   Unsuccessful Outreach Note  05/11/2021 Name: Terri Sparks MRN: [id number] DOB: Apr 11, 1976  Referred by: Ladell Pier, MD Reason for referral : High Risk Managed Medicaid (I called the patient today to offer her phone visits with the MM RNCM and the Pharmacist. No one answered and there was not a VM.)   An unsuccessful telephone outreach was attempted today. The patient was referred to the case management team for assistance with care management and care coordination.   Follow Up Plan: The care management team will reach out to the patient again over the next 7-14 days.   [place]

## 2021-05-12 ENCOUNTER — Other Ambulatory Visit: Payer: Self-pay | Admitting: Critical Care Medicine

## 2021-05-12 NOTE — Telephone Encounter (Signed)
Patient's medication to be sent to preferred pharmacy.

## 2021-05-20 ENCOUNTER — Telehealth (HOSPITAL_COMMUNITY): Payer: Self-pay | Admitting: *Deleted

## 2021-05-20 NOTE — Telephone Encounter (Signed)
Call from patient stating she doesn't feel her medicine is really helping. She is referring to the [place]. States she is grateful for it but it only helps for about four hours. She forgot to tell Edddie PA when she saw him last she was getting hives and pulling out her hair and eye lashes. She would like to speak with her provider.

## 2021-05-21 ENCOUNTER — Other Ambulatory Visit: Payer: Self-pay | Admitting: Internal Medicine

## 2021-05-21 DIAGNOSIS — F339 Major depressive disorder, recurrent, unspecified: Secondary | ICD-10-CM

## 2021-05-21 NOTE — Telephone Encounter (Signed)
Requested medication (s) are due for refill today -yes  Requested medication (s) are on the active medication list -yes  Future visit scheduled -no  Last refill: 04/08/21  Notes to clinic: Refill request- outside provider  Requested Prescriptions  Pending Prescriptions Disp Refills   buPROPion (WELLBUTRIN SR) 150 MG 12 hr tablet [Pharmacy Med Name: BUPROPION HCL ER (SR) 150 MG ORAL TABLET EXTENDED RELEASE 12 HOUR] 60 tablet 2    Sig: TAKE ONE TABLET BY MOUTH TWICE DAILY     Psychiatry: Antidepressants - bupropion Failed - 05/21/2021 12:34 PM      Failed - Last BP in normal range    BP Readings from Last 1 Encounters:  02/17/21 (!) 142/83          Passed - Completed PHQ-2 or PHQ-9 in the last 360 days      Passed - Valid encounter within last 6 months    Recent Outpatient Visits           3 weeks ago No-show for appointment   [place] Karle Plumber B, MD   3 months ago Type 2 diabetes mellitus with morbid obesity [facility])   [place] Ladell Pier, MD   4 months ago Essential hypertension   [place] Elsie Stain, MD   9 months ago Preoperative evaluation to rule out surgical contraindication   [place], MD   10 months ago Thromboembolism [facility])   [place], Stephen L, RPH-CPP                 Requested Prescriptions  Pending Prescriptions Disp Refills   buPROPion (WELLBUTRIN SR) 150 MG 12 hr tablet [Pharmacy Med Name: BUPROPION HCL ER (SR) 150 MG ORAL TABLET EXTENDED RELEASE 12 HOUR] 60 tablet 2    Sig: TAKE ONE TABLET BY MOUTH TWICE DAILY     Psychiatry: Antidepressants - bupropion Failed - 05/21/2021 12:34 PM      Failed - Last BP in normal range    BP Readings from Last 1 Encounters:  02/17/21 (!) 142/83          Passed - Completed PHQ-2 or PHQ-9 in the last  360 days      Passed - Valid encounter within last 6 months    Recent Outpatient Visits           3 weeks ago No-show for appointment   [place] Karle Plumber B, MD   3 months ago Type 2 diabetes mellitus with morbid obesity [facility])   [place] Ladell Pier, MD   4 months ago Essential hypertension   [place], MD   9 months ago Preoperative evaluation to rule out surgical contraindication   [place], MD   10 months ago Thromboembolism [facility])   [place], RPH-CPP

## 2021-05-21 NOTE — Telephone Encounter (Signed)
Tyson Alias with [facility] called askinng if Dr. Wynetta Emery will send in a prescription for easy comfort pin needles for pt;s insulin.She checks three times a day  CB#  [phone]

## 2021-05-23 NOTE — Telephone Encounter (Signed)
Provider was able to reach out to patient. No medications adjustments were made but patient was set up with a counselor.

## 2021-05-25 ENCOUNTER — Other Ambulatory Visit (HOSPITAL_COMMUNITY): Payer: Self-pay | Admitting: Physician Assistant

## 2021-05-25 ENCOUNTER — Telehealth: Payer: Self-pay | Admitting: Internal Medicine

## 2021-05-25 DIAGNOSIS — F411 Generalized anxiety disorder: Secondary | ICD-10-CM

## 2021-05-25 NOTE — Telephone Encounter (Signed)
..   Medicaid Managed Care   Unsuccessful Outreach Note  05/25/2021 Name: Terri Sparks MRN: [id number] DOB: 1975-11-25  Referred by: Ladell Pier, MD Reason for referral : High Risk Managed Medicaid and Appointment (Attempted to reach the patient today to offer her a phone visit with the MM Team. She did not answer and I was not able to leave a message.)   A second unsuccessful telephone outreach was attempted today. The patient was referred to the case management team for assistance with care management and care coordination.   Follow Up Plan: The care management team will reach out to the patient again over the next 7-14 days.   [place]

## 2021-06-02 ENCOUNTER — Encounter (HOSPITAL_COMMUNITY): Payer: Self-pay | Admitting: Emergency Medicine

## 2021-06-02 ENCOUNTER — Other Ambulatory Visit: Payer: Self-pay

## 2021-06-02 ENCOUNTER — Emergency Department (HOSPITAL_COMMUNITY)
Admission: EM | Admit: 2021-06-02 | Discharge: 2021-06-02 | Disposition: A | Discharge status: Home / Self Care | Payer: Medicaid Other | Attending: Emergency Medicine | Admitting: Emergency Medicine

## 2021-06-02 DIAGNOSIS — R1110 Vomiting, unspecified: Secondary | ICD-10-CM | POA: Insufficient documentation

## 2021-06-02 DIAGNOSIS — M549 Dorsalgia, unspecified: Secondary | ICD-10-CM | POA: Diagnosis not present

## 2021-06-02 DIAGNOSIS — R319 Hematuria, unspecified: Secondary | ICD-10-CM | POA: Diagnosis not present

## 2021-06-02 DIAGNOSIS — R739 Hyperglycemia, unspecified: Secondary | ICD-10-CM | POA: Insufficient documentation

## 2021-06-02 DIAGNOSIS — R109 Unspecified abdominal pain: Secondary | ICD-10-CM | POA: Insufficient documentation

## 2021-06-02 DIAGNOSIS — Z5321 Procedure and treatment not carried out due to patient leaving prior to being seen by health care provider: Secondary | ICD-10-CM | POA: Diagnosis not present

## 2021-06-02 DIAGNOSIS — I10 Essential (primary) hypertension: Secondary | ICD-10-CM | POA: Diagnosis not present

## 2021-06-02 DIAGNOSIS — N200 Calculus of kidney: Secondary | ICD-10-CM | POA: Diagnosis not present

## 2021-06-02 DIAGNOSIS — M545 Low back pain, unspecified: Secondary | ICD-10-CM | POA: Diagnosis not present

## 2021-06-02 LAB — I-STAT BETA HCG BLOOD, ED (MC, WL, AP ONLY): I-stat hCG, quantitative: <5 m[IU]/mL (ref <5)

## 2021-06-02 LAB — COMPREHENSIVE METABOLIC PANEL
ALT: 11 U/L (ref 0–44)
AST: 13 U/L — ABNORMAL LOW (ref 15–41)
Albumin: 3.4 g/dL — ABNORMAL LOW (ref 3.5–5.0)
Alkaline Phosphatase: 78 U/L (ref 38–126)
Anion gap: 11 (ref 5–15)
BUN: 24 mg/dL — ABNORMAL HIGH (ref 6–20)
CO2: 19 mmol/L — ABNORMAL LOW (ref 22–32)
Calcium: 9.1 mg/dL (ref 8.9–10.3)
Chloride: 102 mmol/L (ref 98–111)
Creatinine, Ser: 2.26 mg/dL — ABNORMAL HIGH (ref 0.44–1.00)
GFR, Estimated: 26 mL/min — ABNORMAL LOW (ref >60)
Glucose, Bld: 326 mg/dL — ABNORMAL HIGH (ref 70–99)
Potassium: 3.9 mmol/L (ref 3.5–5.1)
Sodium: 132 mmol/L — ABNORMAL LOW (ref 135–145)
Total Bilirubin: 0.3 mg/dL (ref 0.3–1.2)
Total Protein: 7.1 g/dL (ref 6.5–8.1)

## 2021-06-02 LAB — CBC WITH DIFFERENTIAL/PLATELET
Abs Immature Granulocytes: 0.06 10*3/uL (ref 0.00–0.07)
Basophils Absolute: 0.1 10*3/uL (ref 0.0–0.1)
Basophils Relative: 1 %
Eosinophils Absolute: 0.4 10*3/uL (ref 0.0–0.5)
Eosinophils Relative: 4 %
HCT: 37.1 % (ref 36.0–46.0)
Hemoglobin: 11.7 g/dL — ABNORMAL LOW (ref 12.0–15.0)
Immature Granulocytes: 1 %
Lymphocytes Relative: 27 %
Lymphs Abs: 3.3 10*3/uL (ref 0.7–4.0)
MCH: 25.8 pg — ABNORMAL LOW (ref 26.0–34.0)
MCHC: 31.5 g/dL (ref 30.0–36.0)
MCV: 81.7 fL (ref 80.0–100.0)
Monocytes Absolute: 0.7 10*3/uL (ref 0.1–1.0)
Monocytes Relative: 6 %
Neutro Abs: 7.5 10*3/uL (ref 1.7–7.7)
Neutrophils Relative %: 61 %
Platelets: 431 10*3/uL — ABNORMAL HIGH (ref 150–400)
RBC: 4.54 MIL/uL (ref 3.87–5.11)
RDW: 16.7 % — ABNORMAL HIGH (ref 11.5–15.5)
WBC: 12.2 10*3/uL — ABNORMAL HIGH (ref 4.0–10.5)
nRBC: 0 % (ref 0.0–0.2)

## 2021-06-02 LAB — URINALYSIS, ROUTINE W REFLEX MICROSCOPIC
Bilirubin Urine: NEGATIVE
Glucose, UA: 150 mg/dL — AB
Ketones, ur: NEGATIVE mg/dL
Nitrite: NEGATIVE
Protein, ur: 30 mg/dL — AB
RBC / HPF: >50 RBC/hpf — ABNORMAL HIGH (ref 0–5)
Specific Gravity, Urine: 1.016 (ref 1.005–1.030)
WBC, UA: >50 WBC/hpf — ABNORMAL HIGH (ref 0–5)
pH: 5 (ref 5.0–8.0)

## 2021-06-02 LAB — CBG MONITORING, ED: Glucose-Capillary: 319 mg/dL — ABNORMAL HIGH (ref 70–99)

## 2021-06-02 NOTE — ED Notes (Signed)
Called pt 3x to go to a room, no response. Pt no longer in lobby.

## 2021-06-02 NOTE — ED Triage Notes (Signed)
Patient arrived with EMS from home reports left flank pain with hematuria and emesis onset 2 weeks ago , CBG= [phone] by EMS .

## 2021-06-03 NOTE — Telephone Encounter (Signed)
Patient's last prescription was filled on August 8th, 2022. Provider to fill prescription 30 days following the previous refill date. Medication to be e-prescribed to preferred pharmacy.

## 2021-06-09 ENCOUNTER — Other Ambulatory Visit: Payer: Self-pay | Admitting: Internal Medicine

## 2021-06-09 ENCOUNTER — Other Ambulatory Visit: Payer: Self-pay

## 2021-06-09 ENCOUNTER — Telehealth (INDEPENDENT_AMBULATORY_CARE_PROVIDER_SITE_OTHER): Payer: Medicaid Other | Admitting: Physician Assistant

## 2021-06-09 ENCOUNTER — Other Ambulatory Visit: Payer: Self-pay | Admitting: Critical Care Medicine

## 2021-06-09 DIAGNOSIS — F411 Generalized anxiety disorder: Secondary | ICD-10-CM | POA: Diagnosis not present

## 2021-06-09 DIAGNOSIS — F339 Major depressive disorder, recurrent, unspecified: Secondary | ICD-10-CM

## 2021-06-09 DIAGNOSIS — G4700 Insomnia, unspecified: Secondary | ICD-10-CM

## 2021-06-09 DIAGNOSIS — F17200 Nicotine dependence, unspecified, uncomplicated: Secondary | ICD-10-CM

## 2021-06-09 MED ORDER — BUPROPION HCL ER (SR) 150 MG PO TB12: 150.0000 mg | ORAL_TABLET | Freq: Two times a day (BID) | ORAL | 2 refills | Status: DC

## 2021-06-09 MED ORDER — SERTRALINE HCL 50 MG PO TABS: 150.0000 mg | ORAL_TABLET | Freq: Every day | ORAL | 1 refills | Status: DC

## 2021-06-09 MED ORDER — CLONAZEPAM 0.5 MG PO TABS: 0.5000 mg | ORAL_TABLET | Freq: Three times a day (TID) | ORAL | 1 refills | Status: DC | PRN

## 2021-06-09 MED ORDER — HYDROXYZINE HCL 50 MG PO TABS
50.0000 mg | ORAL_TABLET | Freq: Three times a day (TID) | ORAL | 1 refills | Status: DC | PRN
Start: 2021-06-09 — End: 2021-07-09

## 2021-06-09 MED ORDER — TRAZODONE HCL 100 MG PO TABS: 100.0000 mg | ORAL_TABLET | Freq: Every day | ORAL | 1 refills | Status: DC

## 2021-06-09 NOTE — Progress Notes (Signed)
[place] MD/PA/NP OP Progress Note  Virtual Visit via Telephone Note  I connected with Terri Sparks on 06/09/21 at 11:00 AM EDT by telephone and verified that I am speaking with the correct person using two identifiers.  Location: Patient: Home Provider: Clinic   I discussed the limitations, risks, security and privacy concerns of performing an evaluation and management service by telephone and the availability of in person appointments. I also discussed with the patient that there may be a patient responsible charge related to this service. The patient expressed understanding and agreed to proceed.  Follow Up Instructions:  I discussed the assessment and treatment plan with the patient. The patient was provided an opportunity to ask questions and all were answered. The patient agreed with the plan and demonstrated an understanding of the instructions.   The patient was advised to call back or seek an in-person evaluation if the symptoms worsen or if the condition fails to improve as anticipated.  I provided 35 minutes of non-face-to-face time during this encounter.  Malachy Mood, PA    06/09/2021 10:40 PM Terri Sparks  MRN:  [id number]  Chief Complaint: Follow up and medication management  HPI:     Visit Diagnosis:    ICD-10-CM   1. Anxiety state  F41.1 hydrOXYzine (ATARAX/VISTARIL) 50 MG tablet    clonazePAM (KLONOPIN) 0.5 MG tablet    sertraline (ZOLOFT) 50 MG tablet    2. Major depressive disorder, recurrent episode with anxious distress (HCC)  F33.9 buPROPion (WELLBUTRIN SR) 150 MG 12 hr tablet    sertraline (ZOLOFT) 50 MG tablet    3. Seasonal allergic rhinitis due to pollen  J30.1     4. Insomnia, unspecified type  G47.00 traZODone (DESYREL) 100 MG tablet      Past Psychiatric History:  Anxiety Major depressive disorder Insomnia  Past Medical History:  Past Medical History:  Diagnosis Date   Anxiety    Depression    Diabetes ([place])    Type 2   GERD  (gastroesophageal reflux disease)    History of blood clots    ,DVT-left leg, and early 2000, had blood clot behind left breast   History of kidney stones    Hypercalcemia    Hyperparathyroidism    Hypertension    had been on Lisinopril and PCP took her off it 4 months ago   Iron deficiency anemia    Left thyroid nodule    Mass of right ovary    Nephrolithiasis    PAOD (peripheral arterial occlusive disease) (HCC)    Peripheral vascular disease (HCC)    Prolonged Q-T interval on ECG 04/19/2019   Renal calculi    Substance abuse ([place])    Tooth loose    top tooth from the right  is loose     Past Surgical History:  Procedure Laterality Date   ABDOMINAL AORTOGRAM W/LOWER EXTREMITY N/A 11/14/2017   Procedure: ABDOMINAL AORTOGRAM W/LOWER EXTREMITY;  Surgeon: Waynetta Sandy, MD;  Location: [place] [facility];  Service: Cardiovascular;  Laterality: N/A;   ANGIOPLASTY ILLIAC ARTERY Left 02/25/2018   Procedure: BALLOON ANGIOPLASTY LEFT ILIAC ARTERY;  Surgeon: Conrad [place], MD;  Location: [place];  Service: Vascular;  Laterality: Left;   APPLICATION OF WOUND VAC Left 03/03/2018   Procedure: APPLICATION OF WOUND VAC;  Surgeon: Angelia Mould, MD;  Location: [facility] OR;  Service: Vascular;  Laterality: Left;   CYSTOSCOPY W/ URETERAL STENT PLACEMENT Right 06/27/2018   Procedure: CYSTOSCOPY WITH RETROGRADE PYELOGRAM/URETERAL  DOUBLE J STENT PLACEMENT;  Surgeon: Ceasar Mons, MD;  Location: [place];  Service: Urology;  Laterality: Right;   CYSTOSCOPY WITH RETROGRADE PYELOGRAM, URETEROSCOPY AND STENT PLACEMENT Right 04/03/2020   Procedure: CYSTOSCOPY WITH RIGHT  RETROGRADE PYELOGRAM, URETEROSCOPY WITH HOLMIUM LASER AND STENT EXCHANGE PLACEMENT;  Surgeon: Ardis Hughs, MD;  [facility];  Service: Urology;  Laterality: Right;   CYSTOSCOPY WITH STENT PLACEMENT N/A 02/23/2019   Procedure: CYSTOSCOPY WITH RIGHT URETERAL STENT EXCHANGE/ LEFT URETERAL STENT PLACEMENT;  Surgeon:  Ceasar Mons, MD;  [facility];  Service: Urology;  Laterality: N/A;   CYSTOSCOPY WITH STENT PLACEMENT Right 06/11/2019   Procedure: CYSTOSCOPY,RIGHT RETROGRADE WITH STENT PLACEMENT;  Surgeon: Irine Seal, MD;  [facility];  Service: Urology;  Laterality: Right;   CYSTOSCOPY WITH STENT PLACEMENT Right 03/03/2020   Procedure: CYSTOSCOPY WITH STENT PLACEMENT retroagrade pylerogram;  Surgeon: Ardis Hughs, MD;  [facility];  Service: Urology;  Laterality: Right;   CYSTOSCOPY/RETROGRADE/URETEROSCOPY/STONE EXTRACTION WITH BASKET Bilateral 05/01/2019   Procedure: CYSTOSCOPY/URETEROSCOPY/STONE EXTRACTION / LASER LITHOTRIPSY, BILATERAL URETEROSCOPY WITH URETERAL STONE EXTRACTION AND STENT EXCHANGE;  Surgeon: Ceasar Mons, MD;  Location[facility];  Service: Urology;  Laterality: Bilateral;   CYSTOSCOPY/URETEROSCOPY/HOLMIUM LASER/STENT PLACEMENT Right 06/27/2019   Procedure: CYSTOSCOPY/URETEROSCOPY/HOLMIUM LASER/STENT EXCHANGE;  Surgeon: Ceasar Mons, MD;  [facility];  Service: Urology;  Laterality: Right;   EMBOLECTOMY Left 02/24/2017   Procedure: Left Lower Extremity Embolectomy and Angiogram.;  Surgeon: Waynetta Sandy, MD;  Location: [place];  Service: Vascular;  Laterality: Left;   INSERTION OF ILIAC STENT  02/24/2017   Procedure: INSERTION OF Common ILIAC STENT;  Surgeon: Waynetta Sandy, MD;  Location: [place];  Service: Vascular;;   INTRAOPERATIVE ARTERIOGRAM Left 02/25/2018   Procedure: INTRA OPERATIVE ARTERIOGRAM WITH LEFT LEG RUNOFF;  Surgeon: Conrad Laurel, MD;  Location: [place];  Service: Vascular;  Laterality: Left;   LOWER EXTREMITY ANGIOGRAM Left 02/24/2017   Procedure: Aortagram, Left lower extremity Run-off;  Surgeon: Waynetta Sandy, MD;  Location: [place];  Service: Vascular;  Laterality: Left;   LOWER EXTREMITY ANGIOGRAM Left 12/28/2020   Procedure: AORTOGRAM, LEFT LOWER EXTREMITY ANGIOGRAM,  THROMBOLYSIS VIA LEFT BRACHIAL ARTERY APPROACH;  Surgeon: Cherre Robins, MD;  Location: [place];  Service: Vascular;  Laterality: Left;   LOWER EXTREMITY ANGIOGRAPHY N/A 12/29/2020   Procedure: LYSIS RECHECK;  Surgeon: Waynetta Sandy, MD;  Location: [place] [facility];  Service: Cardiovascular;  Laterality: N/A;   PATCH ANGIOPLASTY Left 02/25/2018   Procedure: PATCH ANGIOPLASTY LEFT SUPERFICIAL FEMORAL ARTERY WITH BOVINE PATCH;  Surgeon: Conrad Redland, MD;  Location: [place];  Service: Vascular;  Laterality: Left;   PERCUTANEOUS VENOUS THROMBECTOMY,LYSIS WITH INTRAVASCULAR ULTRASOUND (IVUS) Left 05/12/2018   Procedure: MECHANICAL THROMBECTOMY LEFT LEG, BALLOON ANGIOPLASTY LEFT ANTERIOR TIBIAL ARTERY, AORTOGRAM WITH LEFT LEG RUNOFF;  Surgeon: Serafina Mitchell, MD;  Location: [place];  Service: Vascular;  Laterality: Left;   PERIPHERAL VASCULAR THROMBECTOMY  12/29/2020   Procedure: PERIPHERAL VASCULAR THROMBECTOMY;  Surgeon: Waynetta Sandy, MD;  Location: [place] [facility];  Service: Cardiovascular;;   removal of parathyroid adenoma  5/11   THROMBECTOMY FEMORAL ARTERY Left 02/25/2018   Procedure: LEFT ILIAC AND POPLITEAL ARTERY THROMBECTOMY;  Surgeon: Conrad Reddell, MD;  Location: [place];  Service: Vascular;  Laterality: Left;   TUBAL LIGATION     WOUND DEBRIDEMENT Left 03/03/2018   Procedure: DEBRIDEMENT WOUND;  Surgeon: Angelia Mould, MD;  [facility];  Service: Vascular;  Laterality: Left;   WOUND EXPLORATION Left 03/03/2018   Procedure: WOUND EXPLORATION;  Surgeon: Angelia Mould, MD;  Location: [facility] OR;  Service: Vascular;  Laterality: Left;    Family Psychiatric History:   Family History:  Family History  Problem Relation Age of Onset   Depression Mother    Diabetes Father    Heart failure Father     Social History:  Social History   Socioeconomic History   Marital status: Single    Spouse name: Not on file   Number of children: Not on file    Years of education: Not on file   Highest education level: Not on file  Occupational History   Not on file  Tobacco Use   Smoking status: Some Days    Packs/day: 0.50    Years: 15.00    Pack years: 7.50    Types: Cigarettes   Smokeless tobacco: Never  Vaping Use   Vaping Use: Never used  Substance and Sexual Activity   Alcohol use: Yes    Comment: occassionally   Drug use: Yes    Types: Marijuana, Cocaine    Comment: once daily- last time used was night of 02/28/2020   Sexual activity: Yes    Partners: Male    Birth control/protection: Other-see comments    Comment: BTL  Other Topics Concern   Not on file  Social History Narrative   Not on file   Social Determinants of Health   Financial Resource Strain: Not on file  Food Insecurity: Not on file  Transportation Needs: Not on file  Physical Activity: Not on file  Stress: Not on file  Social Connections: Not on file    Allergies:  Allergies  Allergen Reactions   Ciprofloxacin Hcl Hives   Dilaudid [Hydromorphone Hcl] Shortness Of Breath and Other (See Comments)    "Asystole," per pt report   Macrobid [Nitrofurantoin Macrocrystal] Hives, Shortness Of Breath and Rash   Other Hives, Shortness Of Breath and Rash    NO "-CILLINS"!!!   Penicillins Shortness Of Breath    Had had cephalosporins without incident Has patient had a PCN reaction causing immediate rash, facial/tongue/throat swelling, SOB or lightheadedness with hypotension: Yes Has patient had a PCN reaction causing severe rash involving mucus membranes or skin necrosis: Unk Has patient had a PCN reaction that required hospitalization: Unk Has patient had a PCN reaction occurring within the last 10 years: No If all of the above answers are "NO", then may proceed with Cephalosporin use.    Sulfa Antibiotics Hives, Shortness Of Breath and Rash   Lexapro [Escitalopram Oxalate] Other (See Comments)    "I just did not like it."    Metabolic Disorder Labs: Lab  Results  Component Value Date   HGBA1C 13.5 (H) 12/27/2020   MPG 340.75 12/27/2020   MPG 335.01 12/27/2020   No results found for: PROLACTIN Lab Results  Component Value Date   CHOL 208 (H) 07/29/2020   TRIG 381 (H) 07/29/2020   HDL 40 07/29/2020   CHOLHDL 5.2 (H) 07/29/2020   VLDL 47 (H) 06/27/2018   LDLCALC 103 (H) 07/29/2020   LDLCALC 87 01/21/2020   Lab Results  Component Value Date   TSH 0.983 07/30/2019    Therapeutic Level Labs: No results found for: LITHIUM No results found for: VALPROATE No components found for:  CBMZ  Current Medications: Current Outpatient Medications  Medication Sig Dispense Refill   hydrOXYzine (ATARAX/VISTARIL) 50 MG tablet  Take 1 tablet (50 mg total) by mouth 3 (three) times daily as needed. 75 tablet 1   ACCU-CHEK GUIDE test strip USE AS INSTRUCTED TO CHECK BLOOD SUGAR THREE TIMES DAILY 100 each 2   Accu-Chek Softclix Lancets lancets USE AS INSTRUCTED TO CHECK BLOOD SUGAR THREE TIMES DAILY 100 each 2   acetaminophen (TYLENOL) 500 MG tablet Take 2,000 mg by mouth daily as needed (for headaches).      amLODipine (NORVASC) 5 MG tablet Take 1 tablet (5 mg total) by mouth daily. 30 tablet 11   aspirin 81 MG EC tablet TAKE 1 TABLET (81 MG TOTAL) BY MOUTH DAILY. SWALLOW WHOLE. (Patient taking differently: Take by mouth 5 (five) times daily. SWALLOW WHOLE.) 90 tablet 3   atorvastatin (LIPITOR) 40 MG tablet TAKE 1 TABLET (40 MG TOTAL) BY MOUTH DAILY. 90 tablet 3   Blood Glucose Monitoring Suppl (ACCU-CHEK GUIDE ME) w/Device KIT USE AS INSTRUCTED TO CHECK BLOOD SUGAR THREE TIMES DAILY (Patient not taking: Reported on 02/17/2021) 1 kit 0   buPROPion (WELLBUTRIN SR) 150 MG 12 hr tablet Take 1 tablet (150 mg total) by mouth 2 (two) times daily. 60 tablet 2   carvedilol (COREG) 3.125 MG tablet Take 1 tablet (3.125 mg total) by mouth 2 (two) times daily. 60 tablet 6   Cholecalciferol (VITAMIN D-3 PO) Take 1 capsule by mouth daily.     [START ON 06/27/2021]  clonazePAM (KLONOPIN) 0.5 MG tablet Take 1 tablet (0.5 mg total) by mouth 3 (three) times daily as needed for anxiety. 90 tablet 1   ferrous sulfate 325 (65 FE) MG tablet Take 1 tablet (325 mg total) by mouth daily with breakfast. (Patient not taking: Reported on 02/17/2021) 30 tablet 3   fexofenadine (ALLEGRA) 60 MG tablet Take 1 tablet (60 mg total) by mouth 2 (two) times daily. 60 tablet 3   folic acid (FOLVITE) 1 MG tablet Take 1 tablet (1 mg total) by mouth daily. 30 tablet 3   gabapentin (NEURONTIN) 400 MG capsule TAKE ONE CAPSULE BY MOUTH THREE TIMES DAILY 90 capsule 0   insulin aspart (NOVOLOG) 100 UNIT/ML FlexPen CBG 70 - 120:             0 units  CBG 121 - 150: 2 units  CBG 151 - 200: 3 units  CBG 201 - 250: 5 units  CBG 251 - 300: 8 units  CBG 301 - 350: 11 units  CBG 351 - 400: 15 units  CBG > 400:                 Call Primary provider 15 mL 5   Insulin Pen Needle 32G X 4 MM MISC USE AS DIRECTED (Patient not taking: Reported on 02/17/2021) 100 each 0   Insulin Syringes, Disposable, U-100 1 ML MISC Use as directed (Patient not taking: Reported on 02/17/2021) 100 each 0   LANTUS SOLOSTAR 100 UNIT/ML Solostar Pen INJECT 60 UNITS INTO THE SKIN DAILY. 15 mL 7   liraglutide (VICTOZA) 18 MG/3ML SOPN Start 0.40m SQ once a day for 7 days, then increase to 1.253monce a day 6 mL 3   megestrol (MEGACE) 40 MG tablet Take 1 tablet (40 mg total) by mouth 2 (two) times daily. Can increase to two tablets twice a day in the event of heavy bleeding (Patient not taking: Reported on 02/17/2021) 60 tablet 5   Multiple Vitamins-Minerals (ONE-A-DAY WOMENS PO) Take 1 tablet by mouth daily.     nicotine (NICODERM CQ -  DOSED IN MG/24 HOURS) 14 mg/24hr patch PLACE ONE PATCH ONTO THE SKIN EVERY DAY 28 patch 0   nicotine (NICODERM CQ - DOSED IN MG/24 HOURS) 21 mg/24hr patch Place 1 patch (21 mg total) onto the skin daily. 28 patch 0   nicotine (NICODERM CQ - DOSED IN MG/24 HOURS) 21 mg/24hr patch PLACE 1 PATCH (21 MG  TOTAL) ONTO THE SKIN DAILY. 28 patch 0   nitroGLYCERIN (NITROSTAT) 0.4 MG SL tablet PLACE 1 TABLET (0.4 MG TOTAL) UNDER THE TONGUE EVERY 5 (FIVE) MINUTES AS NEEDED FOR CHEST PAIN. 25 tablet 2   olopatadine (PATANOL) 0.1 % ophthalmic solution Place 1 drop into both eyes 2 (two) times daily. 5 mL 1   ondansetron (ZOFRAN) 4 MG tablet Take 1 tablet (4 mg total) by mouth every 8 (eight) hours as needed for nausea or vomiting. 20 tablet 0   PROAIR HFA 108 (90 Base) MCG/ACT inhaler INHALE TWO PUFFS BY MOUTH EVERY SIX HOURS AS NEEDED FOR WHEEZING OR SHORTNESS OF BREATH 8.5 g 0   sertraline (ZOLOFT) 50 MG tablet Take 3 tablets (150 mg total) by mouth daily. 90 tablet 1   sucralfate (CARAFATE) 1 GM/10ML suspension TAKE 10 MLS (1 G TOTAL) BY MOUTH 2 (TWO) TIMES DAILY WITH BREAKFAST AND LUNCH. 420 mL 0   traZODone (DESYREL) 100 MG tablet Take 1 tablet (100 mg total) by mouth at bedtime. 30 tablet 1   TRUEPLUS INSULIN SYRINGE 31G X 5/16" 1 ML MISC USE AS DIRECTED 100 each 3   vitamin B-12 (CYANOCOBALAMIN) 1000 MCG tablet Take 2 tablets (2,000 mcg total) by mouth daily. 30 tablet 3   warfarin (COUMADIN) 5 MG tablet TAKE 5 MG BY MOUTH AT NOON ON SUN/TUES/THURS/SAT AND 10 MG ON MON/WED/FRI 40 tablet 0   No current facility-administered medications for this visit.     Musculoskeletal: Strength & Muscle Tone: Unable to assess due to telemedicine visit Corinne: Unable to assess due to telemedicine visit Patient leans: Unable to assess due to telemedicine visit  Psychiatric Specialty Exam: Review of Systems  Psychiatric/Behavioral:  Positive for decreased concentration and sleep disturbance. Negative for dysphoric mood, hallucinations, self-injury and suicidal ideas. The patient is nervous/anxious. The patient is not hyperactive.    There were no vitals taken for this visit.There is no height or weight on file to calculate BMI.  General Appearance: Unable to assess due to telemedicine visit  Eye  Contact:  Unable to assess due to telemedicine visit  Speech:  Clear and Coherent and Normal Rate  Volume:  Normal  Mood:  Anxious, Depressed, and Irritable  Affect:  Congruent and Depressed  Thought Process:  Coherent, Goal Directed, and Descriptions of Associations: Intact  Orientation:  Full (Time, Place, and Person)  Thought Content: WDL and Tangential   Suicidal Thoughts:  No  Homicidal Thoughts:  No  Memory:  Immediate;   Good Recent;   Good Remote;   Good  Judgement:  Good  Insight:  Fair  Psychomotor Activity:  Restlessness  Concentration:  Concentration: Good and Attention Span: Good  Recall:  [place] of Knowledge: Fair  Language: Good  Akathisia:  NA  Handed:  Right  AIMS (if indicated): not done  Assets:  Communication Skills Desire for Improvement Financial Resources/Insurance Housing  ADL's:  Intact  Cognition: WNL  Sleep:  Fair   Screenings: GAD-7    Flowsheet Row Video Visit from 06/09/2021 in [facility] Office Visit from 02/17/2021 in [facility] Office Visit from 07/29/2020 in [place] Video Visit from 05/29/2020 in [facility] for [place] [facility] Office Visit from 03/24/2020 in [place]  Total GAD-7 Score _0 PHQ2-9    Flowsheet Row Video Visit from 06/09/2021 in [facility] Office Visit from 02/17/2021 in [place] Office Visit from 07/29/2020 in [place] Video Visit from 05/29/2020 in Center for [place] [facility] Office Visit from 03/24/2020 in [place]  PHQ-2 Total Score _1 PHQ-9 Total Score _2 Flowsheet Row Video Visit from 06/09/2021 in [facility] ED from 06/02/2021 in  [place] ED to Hosp-Admission (Discharged) from 12/27/2020 in [facility] 4E CV SURGICAL PROGRESSIVE CARE  C-SSRS RISK CATEGORY Low Risk No Risk No Risk        Assessment and Plan:     1. Anxiety state  - hydrOXYzine (ATARAX/VISTARIL) 50 MG tablet; Take 1 tablet (50 mg total) by mouth 3 (three) times daily as needed.  Dispense: 75 tablet; Refill: 1 - clonazePAM (KLONOPIN) 0.5 MG tablet; Take 1 tablet (0.5 mg total) by mouth 3 (three) times daily as needed for anxiety.  Dispense: 90 tablet; Refill: 1 - sertraline (ZOLOFT) 50 MG tablet; Take 3 tablets (150 mg total) by mouth daily.  Dispense: 90 tablet; Refill: 1  2. Major depressive disorder, recurrent episode with anxious distress (HCC)  - buPROPion (WELLBUTRIN SR) 150 MG 12 hr tablet; Take 1 tablet (150 mg total) by mouth 2 (two) times daily.  Dispense: 60 tablet; Refill: 2 - sertraline (ZOLOFT) 50 MG tablet; Take 3 tablets (150 mg total) by mouth daily.  Dispense: 90 tablet; Refill: 1  3. Insomnia, unspecified type  - traZODone (DESYREL) 100 MG tablet; Take 1 tablet (100 mg total) by mouth at bedtime.  Dispense: 30 tablet; Refill: 1  Patient to follow up in 6 weeks Provider spent a total of 35 minutes with the patient/reviewing patient's chart  Malachy Mood, PA 06/09/2021, 10:40 PM

## 2021-06-09 NOTE — Telephone Encounter (Signed)
Requested medications are due for refill today.  Unsure  Requested medications are on the active medications list.  yes  Last refill. 01/08/2021  Future visit scheduled.   no  Notes to clinic.  Last Rx was written to expire on 04/18/2021.

## 2021-06-10 NOTE — Telephone Encounter (Signed)
Requested medications are due for refill today.  yes  Requested medications are on the active medications list.  yes  Last refill. 04/07/2021  Future visit scheduled.   no  Notes to clinic.  Medication not delegated.

## 2021-06-10 NOTE — Telephone Encounter (Signed)
Requested Prescriptions  Pending Prescriptions Disp Refills  . warfarin (COUMADIN) 5 MG tablet [Pharmacy Med Name: WARFARIN SODIUM 5 MG ORAL TABLET] 40 tablet 0    Sig: TAKE ONE TABLET (5 MG) BY MOUTH AT NOON ON SUN/TUES/THURS/SAT AND 2 TABLETS (10 MG) ON MON/WED/FRI     Hematology:  Anticoagulants - warfarin Failed - 06/09/2021  9:46 AM      Failed - This refill cannot be delegated      Failed - If the patient is managed by Coumadin Clinic - route to their Pool. If not, forward to the provider.      Failed - INR in normal range and within 30 days    INR  Date Value Ref Range Status  02/17/2021 1.2 (A) 2.0 - 3.0 Final  01/06/2021 2.0 (H) 0.8 - 1.2 Final    Comment:    (NOTE) INR goal varies based on device and disease states. Performed at [facility], [place] [address], [place], [place] [address]          Passed - Valid encounter within last 3 months    Recent Outpatient Visits          1 month ago No-show for appointment   [place] Karle Plumber B, MD   3 months ago Type 2 diabetes mellitus with morbid obesity [facility])   [place], MD   5 months ago Essential hypertension   [place], MD   10 months ago Preoperative evaluation to rule out surgical contraindication   [place], MD   11 months ago Thromboembolism [facility])   [place], RPH-CPP             . nicotine (NICODERM CQ - DOSED IN MG/24 HOURS) 14 mg/24hr patch [Pharmacy Med Name: NICOTINE 14 MG/24HR TRANSDERMAL PATCH 24 HOUR]      Sig: PLACE ONE PATCH ONTO THE SKIN EVERY DAY     Psychiatry:  Drug Dependence Therapy Passed - 06/09/2021  9:46 AM      Passed - Valid encounter within last 12 months    Recent Outpatient Visits          1 month ago No-show for appointment   [place] Karle Plumber B, MD   3 months ago Type 2 diabetes mellitus with morbid obesity [facility])   [place], MD   5 months ago Essential hypertension   [place], MD   10 months ago Preoperative evaluation to rule out surgical contraindication   [place], MD   11 months ago Thromboembolism [facility])   [place], RPH-CPP

## 2021-06-12 ENCOUNTER — Encounter (HOSPITAL_COMMUNITY): Payer: Self-pay | Admitting: Physician Assistant

## 2021-06-12 ENCOUNTER — Other Ambulatory Visit: Payer: Self-pay | Admitting: Internal Medicine

## 2021-06-12 NOTE — Telephone Encounter (Signed)
Requested medication (s) are due for refill today: Yes  Requested medication (s) are on the active medication list: Yes  Last refill:  2 months ago  Future visit scheduled: No  Notes to clinic:  Unable to refill per protocol, cannot delegate.      Requested Prescriptions  Pending Prescriptions Disp Refills   warfarin (COUMADIN) 5 MG tablet [Pharmacy Med Name: WARFARIN SODIUM 5 MG ORAL TABLET] 40 tablet 0    Sig: TAKE ONE TABLET (5 MG) BY MOUTH AT NOON ON SUN/TUES/THURS/SAT AND 2 TABLETS (10 MG) ON MON/WED/FRI     Hematology:  Anticoagulants - warfarin Failed - 06/12/2021 11:01 AM      Failed - This refill cannot be delegated      Failed - If the patient is managed by Coumadin Clinic - route to their Pool. If not, forward to the provider.      Failed - INR in normal range and within 30 days    INR  Date Value Ref Range Status  02/17/2021 1.2 (A) 2.0 - 3.0 Final  01/06/2021 2.0 (H) 0.8 - 1.2 Final    Comment:    (NOTE) INR goal varies based on device and disease states. Performed at [facility], [place] [address] [place], [place] [address]           Passed - Valid encounter within last 3 months    Recent Outpatient Visits           1 month ago No-show for appointment   [place] Karle Plumber B, MD   3 months ago Type 2 diabetes mellitus with morbid obesity [facility])   [place], MD   5 months ago Essential hypertension   [place], MD   10 months ago Preoperative evaluation to rule out surgical contraindication   [place], MD   11 months ago Thromboembolism [facility])   [place], RPH-CPP

## 2021-06-18 ENCOUNTER — Other Ambulatory Visit (HOSPITAL_COMMUNITY): Payer: Self-pay | Admitting: Physician Assistant

## 2021-06-18 ENCOUNTER — Other Ambulatory Visit: Payer: Self-pay | Admitting: Internal Medicine

## 2021-06-18 DIAGNOSIS — G4700 Insomnia, unspecified: Secondary | ICD-10-CM

## 2021-06-18 NOTE — Telephone Encounter (Signed)
Requested medication (s) are due for refill today:   Yes  Requested medication (s) are on the active medication list:   Yes  Future visit scheduled:   No   Was a No Show a month ago   Last ordered: Test strips and lancets 03/19/2021 #100, 2 refills.    Warfarin is a non delegated refill.   Requested Prescriptions  Pending Prescriptions Disp Refills   Accu-Chek Softclix Lancets lancets [Pharmacy Med Name: ACCU-CHEK SOFTCLIX LANCETS] 100 each 2    Sig: USE AS INSTRUCTED TO [place] DAILY     Endocrinology: Diabetes - Testing Supplies Passed - 06/18/2021 10:37 AM      Passed - Valid encounter within last 12 months    Recent Outpatient Visits           1 month ago No-show for appointment   [place] Karle Plumber B, MD   4 months ago Type 2 diabetes mellitus with morbid obesity ([place])   [place] Ladell Pier, MD   5 months ago Essential hypertension   [place], MD   10 months ago Preoperative evaluation to rule out surgical contraindication   [place], MD   11 months ago Thromboembolism [facility])   Arrey, Jarome Matin, RPH-CPP               ACCU-CHEK GUIDE test strip [place] Med Name: [place] 100 each 2    Sig: USE AS INSTRUCTED TO [place]     Endocrinology: Diabetes - Testing Supplies Passed - 06/18/2021 10:37 AM      Passed - Valid encounter within last 12 months    Recent Outpatient Visits           1 month ago No-show for appointment   [place] Karle Plumber B, MD   4 months ago Type 2 diabetes mellitus with morbid obesity [facility])   [place] Ladell Pier, MD   5 months ago Essential hypertension   [place], MD   10 months ago Preoperative evaluation to rule out surgical contraindication   [place] Ladell Pier, MD   11 months ago Thromboembolism [facility])   [place], Annie Main L, RPH-CPP               warfarin (COUMADIN) 5 MG tablet [Pharmacy Med Name: WARFARIN SODIUM 5 MG ORAL TABLET] 40 tablet 0    Sig: TAKE ONE TABLET (5 MG) BY MOUTH AT NOON ON SUN/TUES/THURS/SAT AND 2 TABLETS (10 MG) ON MON/WED/FRI     Hematology:  Anticoagulants - warfarin Failed - 06/18/2021 10:37 AM      Failed - This refill cannot be delegated      Failed - If the patient is managed by Coumadin Clinic - route to their Pool. If not, forward to the provider.      Failed - INR in normal range and within 30 days    INR  Date Value Ref Range Status  02/17/2021 1.2 (A) 2.0 - 3.0 Final  01/06/2021 2.0 (H) 0.8 - 1.2 Final    Comment:    (NOTE) INR goal varies based on  device and disease states. Performed at [facility], [place] [address] [place], [place] [address]           Passed - Valid encounter within last 3 months    Recent Outpatient Visits           1 month ago No-show for appointment   [place] Karle Plumber B, MD   4 months ago Type 2 diabetes mellitus with morbid obesity [facility])   [place], MD   5 months ago Essential hypertension   [place], MD   10 months ago Preoperative evaluation to rule out surgical contraindication   [place], MD   11 months ago Thromboembolism [facility])   [place], RPH-CPP

## 2021-06-22 ENCOUNTER — Other Ambulatory Visit (HOSPITAL_COMMUNITY): Payer: Self-pay | Admitting: Physician Assistant

## 2021-06-22 DIAGNOSIS — F411 Generalized anxiety disorder: Secondary | ICD-10-CM

## 2021-06-22 MED ORDER — CLONAZEPAM 1 MG PO TABS: 1.0000 mg | ORAL_TABLET | Freq: Three times a day (TID) | ORAL | 0 refills | Status: DC | PRN

## 2021-07-09 ENCOUNTER — Other Ambulatory Visit (HOSPITAL_COMMUNITY): Payer: Self-pay | Admitting: Physician Assistant

## 2021-07-09 DIAGNOSIS — G4700 Insomnia, unspecified: Secondary | ICD-10-CM

## 2021-07-09 DIAGNOSIS — F411 Generalized anxiety disorder: Secondary | ICD-10-CM

## 2021-07-09 DIAGNOSIS — F339 Major depressive disorder, recurrent, unspecified: Secondary | ICD-10-CM

## 2021-07-10 ENCOUNTER — Other Ambulatory Visit: Payer: Self-pay | Admitting: Internal Medicine

## 2021-07-10 NOTE — Telephone Encounter (Signed)
Requested medications are due for refill today yes, actually picked up 4 week supply 04/08/21 and none since  Requested medications are on the active medication list yes  Last refill 04/08/21  Last visit 02/17/21  Future visit scheduled no  Notes to clinic This med is not delegated, please assess.

## 2021-07-13 ENCOUNTER — Other Ambulatory Visit: Payer: Self-pay | Admitting: Internal Medicine

## 2021-07-13 DIAGNOSIS — F17200 Nicotine dependence, unspecified, uncomplicated: Secondary | ICD-10-CM

## 2021-07-14 NOTE — Telephone Encounter (Signed)
Requested medications are due for refill today yes  Requested medications are on the active medication list yes  Last refill 05/12/21  Last visit 02/17/21  Future visit scheduled NO  Notes to clinic there are three nicotine patch rx on current med list, please assess.

## 2021-07-15 ENCOUNTER — Other Ambulatory Visit: Payer: Self-pay | Admitting: Internal Medicine

## 2021-07-15 DIAGNOSIS — F17200 Nicotine dependence, unspecified, uncomplicated: Secondary | ICD-10-CM

## 2021-07-16 ENCOUNTER — Other Ambulatory Visit: Payer: Self-pay | Admitting: Internal Medicine

## 2021-07-16 NOTE — Telephone Encounter (Signed)
Pt called and advised she will need an appt scheduled in order to receive medication refills. Pt stated "I have gone 4 weeks without  coumadin and you mean to tell me I'm going to have to go even longer without it, I'm gonna end up in the hospital because I know I wont get an appt tomorrow". Pt was advised appt was going to be scheduled for the earliest available and asked to be placed on brief hold so I could reach out to [facility]. Pt hung up. Message sent to [place], [facility] in teams per [place] to reach out to the pt with an appt with either provider or pharmacist.

## 2021-07-17 ENCOUNTER — Telehealth: Payer: Self-pay | Admitting: *Deleted

## 2021-07-17 ENCOUNTER — Ambulatory Visit (HOSPITAL_COMMUNITY): Payer: Medicaid Other | Admitting: Licensed Clinical Social Worker

## 2021-07-17 NOTE — Telephone Encounter (Signed)
Copied from [place] [phone]. Topic: General - Other >> Jul 16, 2021  8:39 AM Tessa Lerner A wrote: Reason for CRM: The patient would like to be contacted by the practice administrator to discuss ongoing concerns with their visits to the office   The patient shares  that they're continuing to experience difficulty coordination their prescriptions as well as warfarin (COUMADIN) 5 MG tablet [id number] prescriptions   Please contact further when possible to discuss both matters

## 2021-07-17 NOTE — Telephone Encounter (Signed)
Copied from [place] [phone]. Topic: General - Other >> Jul 16, 2021  8:38 AM Tessa Lerner A wrote: Patient has made an additional call regarding their reassignment   Please contact again when possible

## 2021-07-18 ENCOUNTER — Other Ambulatory Visit (HOSPITAL_COMMUNITY): Payer: Self-pay | Admitting: Physician Assistant

## 2021-07-18 DIAGNOSIS — F411 Generalized anxiety disorder: Secondary | ICD-10-CM

## 2021-07-20 ENCOUNTER — Telehealth (HOSPITAL_COMMUNITY): Payer: Self-pay | Admitting: *Deleted

## 2021-07-20 NOTE — Telephone Encounter (Signed)
Returned pt call. Pt is scheduled to see provider 10/18 at 210pm

## 2021-07-20 NOTE — Telephone Encounter (Signed)
Pt called and is requesting to follow up on this. Attempted to get pt transferred to office with no response. Pt states that she is still in need of her medications and is requesting to have someone follow up. Please advise.

## 2021-07-20 NOTE — Telephone Encounter (Signed)
Pt called--stated out of medication for 2 days and requesting Rx clonazePAM (KLONOPIN) 1 MG tablet refills--sent to [facility]  Last VV--06-09-21 Last refill--06-27-21 Next appt-07-22-21

## 2021-07-21 ENCOUNTER — Other Ambulatory Visit (HOSPITAL_COMMUNITY): Payer: Self-pay | Admitting: Physician Assistant

## 2021-07-21 ENCOUNTER — Telehealth: Payer: Self-pay | Admitting: Internal Medicine

## 2021-07-21 ENCOUNTER — Telehealth: Payer: Self-pay

## 2021-07-21 ENCOUNTER — Other Ambulatory Visit: Payer: Self-pay

## 2021-07-21 ENCOUNTER — Encounter: Payer: Self-pay | Admitting: Internal Medicine

## 2021-07-21 ENCOUNTER — Ambulatory Visit: Payer: Medicaid Other | Attending: Internal Medicine | Admitting: Internal Medicine

## 2021-07-21 VITALS — BP 151/80 | HR 85 | Resp 16 | Wt 258.8 lb

## 2021-07-21 DIAGNOSIS — I129 Hypertensive chronic kidney disease with stage 1 through stage 4 chronic kidney disease, or unspecified chronic kidney disease: Secondary | ICD-10-CM | POA: Diagnosis not present

## 2021-07-21 DIAGNOSIS — Z882 Allergy status to sulfonamides status: Secondary | ICD-10-CM | POA: Insufficient documentation

## 2021-07-21 DIAGNOSIS — E1159 Type 2 diabetes mellitus with other circulatory complications: Secondary | ICD-10-CM | POA: Diagnosis not present

## 2021-07-21 DIAGNOSIS — Z6841 Body Mass Index (BMI) 40.0 and over, adult: Secondary | ICD-10-CM | POA: Insufficient documentation

## 2021-07-21 DIAGNOSIS — E1122 Type 2 diabetes mellitus with diabetic chronic kidney disease: Secondary | ICD-10-CM | POA: Insufficient documentation

## 2021-07-21 DIAGNOSIS — F17200 Nicotine dependence, unspecified, uncomplicated: Secondary | ICD-10-CM

## 2021-07-21 DIAGNOSIS — Z8249 Family history of ischemic heart disease and other diseases of the circulatory system: Secondary | ICD-10-CM | POA: Insufficient documentation

## 2021-07-21 DIAGNOSIS — Z888 Allergy status to other drugs, medicaments and biological substances status: Secondary | ICD-10-CM | POA: Insufficient documentation

## 2021-07-21 DIAGNOSIS — Z794 Long term (current) use of insulin: Secondary | ICD-10-CM | POA: Diagnosis not present

## 2021-07-21 DIAGNOSIS — F17210 Nicotine dependence, cigarettes, uncomplicated: Secondary | ICD-10-CM | POA: Diagnosis not present

## 2021-07-21 DIAGNOSIS — N1832 Chronic kidney disease, stage 3b: Secondary | ICD-10-CM | POA: Diagnosis not present

## 2021-07-21 DIAGNOSIS — I749 Embolism and thrombosis of unspecified artery: Secondary | ICD-10-CM | POA: Diagnosis not present

## 2021-07-21 DIAGNOSIS — Z7901 Long term (current) use of anticoagulants: Secondary | ICD-10-CM | POA: Insufficient documentation

## 2021-07-21 DIAGNOSIS — Z2821 Immunization not carried out because of patient refusal: Secondary | ICD-10-CM

## 2021-07-21 DIAGNOSIS — Z885 Allergy status to narcotic agent status: Secondary | ICD-10-CM | POA: Insufficient documentation

## 2021-07-21 DIAGNOSIS — Z7982 Long term (current) use of aspirin: Secondary | ICD-10-CM | POA: Insufficient documentation

## 2021-07-21 DIAGNOSIS — Z1211 Encounter for screening for malignant neoplasm of colon: Secondary | ICD-10-CM | POA: Diagnosis not present

## 2021-07-21 DIAGNOSIS — Z86718 Personal history of other venous thrombosis and embolism: Secondary | ICD-10-CM | POA: Diagnosis not present

## 2021-07-21 DIAGNOSIS — Z833 Family history of diabetes mellitus: Secondary | ICD-10-CM | POA: Insufficient documentation

## 2021-07-21 DIAGNOSIS — Z880 Allergy status to penicillin: Secondary | ICD-10-CM | POA: Insufficient documentation

## 2021-07-21 DIAGNOSIS — R112 Nausea with vomiting, unspecified: Secondary | ICD-10-CM | POA: Insufficient documentation

## 2021-07-21 DIAGNOSIS — Z881 Allergy status to other antibiotic agents status: Secondary | ICD-10-CM | POA: Insufficient documentation

## 2021-07-21 DIAGNOSIS — E1169 Type 2 diabetes mellitus with other specified complication: Secondary | ICD-10-CM | POA: Diagnosis not present

## 2021-07-21 DIAGNOSIS — E1140 Type 2 diabetes mellitus with diabetic neuropathy, unspecified: Secondary | ICD-10-CM | POA: Diagnosis not present

## 2021-07-21 DIAGNOSIS — I152 Hypertension secondary to endocrine disorders: Secondary | ICD-10-CM

## 2021-07-21 DIAGNOSIS — R1110 Vomiting, unspecified: Secondary | ICD-10-CM

## 2021-07-21 LAB — POCT GLYCOSYLATED HEMOGLOBIN (HGB A1C): HbA1c, POC (controlled diabetic range): 9.5 % — AB (ref 0.0–7.0)

## 2021-07-21 LAB — GLUCOSE, POCT (MANUAL RESULT ENTRY): POC Glucose: 229 mg/dl — AB (ref 70–99)

## 2021-07-21 MED ORDER — LANTUS SOLOSTAR 100 UNIT/ML ~~LOC~~ SOPN: 65.0000 [IU] | PEN_INJECTOR | Freq: Every day | SUBCUTANEOUS | 7 refills | Status: DC

## 2021-07-21 MED ORDER — ACCU-CHEK SOFTCLIX LANCETS MISC: 0 refills | Status: DC

## 2021-07-21 MED ORDER — WARFARIN SODIUM 5 MG PO TABS: ORAL_TABLET | ORAL | 1 refills | Status: DC

## 2021-07-21 MED ORDER — FREESTYLE LIBRE SENSOR SYSTEM MISC: 12 refills | Status: DC

## 2021-07-21 MED ORDER — FREESTYLE LIBRE READER DEVI: 1.0000 | Freq: Once | 6 refills | Status: AC

## 2021-07-21 MED ORDER — ACCU-CHEK GUIDE VI STRP: ORAL_STRIP | 0 refills | Status: DC

## 2021-07-21 MED ORDER — NICOTINE 21 MG/24HR TD PT24: MEDICATED_PATCH | Freq: Every day | TRANSDERMAL | 2 refills | Status: DC

## 2021-07-21 NOTE — Progress Notes (Signed)
Patient ID: Terri Sparks, female    DOB: [birth year]  MRN: [id number]  CC: Diabetes and Hypertension   Subjective: Terri Sparks is a 45 y.o. female who presents for chronic ds management Her concerns today include:  Hx of DM type 2 with macroalbumin, CKD 3, HTN, systolic dysf without heart failure, tob dep, PVD with hx of recurrent LLE acute limb ischemia requiring stent to the iliac artery, thrombolysis and embolectomy in the past followed by Dr. Donzetta Matters of vascular surgery, hyperparathyroidism s/p resection of parathryroid adenoma 2010, GERD, IDA, B12 def/folate def anxiety/dep, DUB, subst abuse (cocaine/marijuana).  Pt needing RF on Coumadin.  Out x 3 wks. we had refused to fill because she was not keeping follow-up appointments with the clinical pharmacist to have INR monitored.  Patient states that she continues to struggle with transportation issues.  She just found out that call office transportation.  She also has Medicaid which also offers transportation -I inquired whether she still has heavy menses and are menses heavier on Coumadin then when she was on Eliquis. she states that her menses is heavy about the same as when she was on Eliquis as on Coumadin.  DM: Results for orders placed or performed in visit on 07/21/21  POCT glucose (manual entry)  Result Value Ref Range   POC Glucose 229 (A) 70 - 99 mg/dl  POCT glycosylated hemoglobin (Hb A1C)  Result Value Ref Range   Hemoglobin A1C     HbA1c POC (<> result, manual entry)     HbA1c, POC (prediabetic range)     HbA1c, POC (controlled diabetic range) 9.5 (A) 0.0 - 7.0 %  -Not checking BS she reports that Medicaid will pay for her to get freestyle libre meter.  She would like a prescription to be sent to her pharmacy. On Novolog SSI averaging 5 to 15 units with meals, Lantus 65 units daily.  Reports taking consistently.  She was restarted on Victoza on last visit.  However patient states she has been out of it for a while and  not sure whether she ever got it.  She reports ongoing issue with nausea/vomiting post meals.  Does not happen all the time but enough to be bothersome.  She tries to eat twice a day.  She has had chronic renal insufficiency partially due to postrenal given problems that she has had with recurrent stones.  However most recent GFR was 26 with creatinine of 2.26.  Tob dep: Started smoking again.  She would like refills on the nicotine patches starting with the 21 mg patches.  States that she had gotten to the 14 mg patches.  Started smoking again.  She desires to quit.    HTN: has not taken Coreg and Norvasc as yet today because she has not eaten as yet. Would like device to check blood pressure.  She tries to limit salt in the foods.  No chest pains or shortness of breath.   Patient Active Problem List   Diagnosis Date Noted   Major depressive disorder, recurrent episode with anxious distress ([place]) 02/17/2021   Class 3 severe obesity due to excess calories with serious comorbidity and body mass index (BMI) of 40.0 to 44.9 in adult [facility]) 02/17/2021   Hypercoagulable state ([place]) 02/17/2021   Hyperlipidemia associated with type 2 diabetes mellitus ([place]) 01/08/2021   Critical limb ischemia with history of revascularization of same extremity ([place]) 12/27/2020   Cocaine use disorder, moderate, dependence ([place]) 07/16/2020   Cannabis use disorder,  moderate, dependence ([place]) 07/16/2020   Homeless 11/13/2019   Chest pain at rest 09/24/2019   Polysubstance abuse ([place]) 09/23/2019   Stage 3b chronic kidney disease ([place]) 07/30/2019   Iron deficiency anemia due to chronic blood loss 06/12/2019   Vitamin B12 deficiency 06/12/2019   Folate deficiency 06/12/2019   Constipation 06/12/2019   Prolonged QT interval    HTN (hypertension) 06/26/2018   Subtherapeutic international normalized ratio (INR) 06/26/2018   Adrenal nodule ([place]) 06/21/2018   Lung nodules 06/21/2018   Abnormal uterine bleeding (AUB)  05/14/2018   Ischemia of extremity 05/12/2018   Former smoker 03/14/2018   Thrombosis of left iliac artery (HCC) 02/25/2018   Thromboembolism ([place]) 02/25/2018   Microalbuminuria 09/29/2017   Insomnia 09/29/2017   Anxiety and depression 04/28/2017   Neuropathy of left lower extremity 04/28/2017   Iliac artery occlusion, left ([place]) 02/25/2017   Tobacco abuse 02/25/2017   Insulin-requiring or dependent type II diabetes mellitus ([place]) 02/25/2017   Peripheral vascular disease of lower extremity ([place]) 02/24/2017   GERD (gastroesophageal reflux disease)    Hyperparathyroidism    Anxiety state      Current Outpatient Medications on File Prior to Visit  Medication Sig Dispense Refill   acetaminophen (TYLENOL) 500 MG tablet Take 2,000 mg by mouth daily as needed (for headaches).      amLODipine (NORVASC) 5 MG tablet Take 1 tablet (5 mg total) by mouth daily. 30 tablet 11   aspirin 81 MG EC tablet TAKE 1 TABLET (81 MG TOTAL) BY MOUTH DAILY. SWALLOW WHOLE. (Patient taking differently: Take by mouth 5 (five) times daily. SWALLOW WHOLE.) 90 tablet 3   atorvastatin (LIPITOR) 40 MG tablet TAKE 1 TABLET (40 MG TOTAL) BY MOUTH DAILY. 90 tablet 3   Blood Glucose Monitoring Suppl (ACCU-CHEK GUIDE ME) w/Device KIT USE AS INSTRUCTED TO CHECK BLOOD SUGAR THREE TIMES DAILY (Patient not taking: Reported on 02/17/2021) 1 kit 0   buPROPion (WELLBUTRIN SR) 150 MG 12 hr tablet Take 1 tablet (150 mg total) by mouth 2 (two) times daily. 60 tablet 2   carvedilol (COREG) 3.125 MG tablet TAKE ONE TABLET BY MOUTH TWICE DAILY 60 tablet 0   Cholecalciferol (VITAMIN D-3 PO) Take 1 capsule by mouth daily.     [START ON 07/27/2021] clonazePAM (KLONOPIN) 1 MG tablet Take 1 tablet (1 mg total) by mouth 3 (three) times daily as needed for anxiety. 90 tablet 0   ferrous sulfate 325 (65 FE) MG tablet Take 1 tablet (325 mg total) by mouth daily with breakfast. (Patient not taking: Reported on 02/17/2021) 30 tablet 3   fexofenadine  (ALLEGRA) 60 MG tablet Take 1 tablet (60 mg total) by mouth 2 (two) times daily. 60 tablet 3   folic acid (FOLVITE) 1 MG tablet Take 1 tablet (1 mg total) by mouth daily. 30 tablet 3   gabapentin (NEURONTIN) 400 MG capsule TAKE ONE CAPSULE BY MOUTH THREE TIMES DAILY 90 capsule 0   hydrOXYzine (ATARAX/VISTARIL) 50 MG tablet TAKE 1 TABLET (50 MG TOTAL) BY MOUTH 3 (THREE) TIMES DAILY AS NEEDED. 75 tablet 1   insulin aspart (NOVOLOG) 100 UNIT/ML FlexPen CBG 70 - 120:             0 units  CBG 121 - 150: 2 units  CBG 151 - 200: 3 units  CBG 201 - 250: 5 units  CBG 251 - 300: 8 units  CBG 301 - 350: 11 units  CBG 351 - 400: 15 units  CBG > 400:  Call Primary provider 15 mL 5   Insulin Pen Needle 32G X 4 MM MISC USE AS DIRECTED (Patient not taking: Reported on 02/17/2021) 100 each 0   Insulin Syringes, Disposable, U-100 1 ML MISC Use as directed (Patient not taking: Reported on 02/17/2021) 100 each 0   Multiple Vitamins-Minerals (ONE-A-DAY WOMENS PO) Take 1 tablet by mouth daily.     nitroGLYCERIN (NITROSTAT) 0.4 MG SL tablet PLACE 1 TABLET (0.4 MG TOTAL) UNDER THE TONGUE EVERY 5 (FIVE) MINUTES AS NEEDED FOR CHEST PAIN. 25 tablet 2   olopatadine (PATANOL) 0.1 % ophthalmic solution Place 1 drop into both eyes 2 (two) times daily. 5 mL 1   ondansetron (ZOFRAN) 4 MG tablet Take 1 tablet (4 mg total) by mouth every 8 (eight) hours as needed for nausea or vomiting. 20 tablet 0   PROAIR HFA 108 (90 Base) MCG/ACT inhaler INHALE TWO PUFFS BY MOUTH EVERY SIX HOURS AS NEEDED FOR WHEEZING OR SHORTNESS OF BREATH 8.5 g 0   sertraline (ZOLOFT) 100 MG tablet TAKE 1 AND 1/2 TABLET (150 MG) BY MOUTH DAILY. 45 tablet 1   sucralfate (CARAFATE) 1 GM/10ML suspension TAKE 10 MLS (1 G TOTAL) BY MOUTH 2 (TWO) TIMES DAILY WITH BREAKFAST AND LUNCH. 420 mL 0   traZODone (DESYREL) 100 MG tablet TAKE 1 TABLET (100 MG TOTAL) BY MOUTH AT BEDTIME. 30 tablet 1   TRUEPLUS INSULIN SYRINGE 31G X 5/16" 1 ML MISC USE AS  DIRECTED 100 each 3   vitamin B-12 (CYANOCOBALAMIN) 1000 MCG tablet Take 2 tablets (2,000 mcg total) by mouth daily. 30 tablet 3   No current facility-administered medications on file prior to visit.    Allergies  Allergen Reactions   Ciprofloxacin Hcl Hives   Dilaudid [Hydromorphone Hcl] Shortness Of Breath and Other (See Comments)    "Asystole," per pt report   Macrobid [Nitrofurantoin Macrocrystal] Hives, Shortness Of Breath and Rash   Other Hives, Shortness Of Breath and Rash    NO "-CILLINS"!!!   Penicillins Shortness Of Breath    Had had cephalosporins without incident Has patient had a PCN reaction causing immediate rash, facial/tongue/throat swelling, SOB or lightheadedness with hypotension: Yes Has patient had a PCN reaction causing severe rash involving mucus membranes or skin necrosis: Unk Has patient had a PCN reaction that required hospitalization: Unk Has patient had a PCN reaction occurring within the last 10 years: No If all of the above answers are "NO", then may proceed with Cephalosporin use.    Sulfa Antibiotics Hives, Shortness Of Breath and Rash   Lexapro [Escitalopram Oxalate] Other (See Comments)    "I just did not like it."    Social History   Socioeconomic History   Marital status: Single    Spouse name: Not on file   Number of children: Not on file   Years of education: Not on file   Highest education level: Not on file  Occupational History   Not on file  Tobacco Use   Smoking status: Some Days    Packs/day: 0.50    Years: 15.00    Pack years: 7.50    Types: Cigarettes   Smokeless tobacco: Never  Vaping Use   Vaping Use: Never used  Substance and Sexual Activity   Alcohol use: Yes    Comment: occassionally   Drug use: Yes    Types: Marijuana, Cocaine    Comment: once daily- last time used was night of 02/28/2020   Sexual activity: Yes    Partners: Male  Birth control/protection: Other-see comments    Comment: BTL  Other Topics  Concern   Not on file  Social History Narrative   Not on file   Social Determinants of Health   Financial Resource Strain: Not on file  Food Insecurity: Not on file  Transportation Needs: Not on file  Physical Activity: Not on file  Stress: Not on file  Social Connections: Not on file  Intimate Partner Violence: Not on file    Family History  Problem Relation Age of Onset   Depression Mother    Diabetes Father    Heart failure Father     Past Surgical History:  Procedure Laterality Date   ABDOMINAL AORTOGRAM W/LOWER EXTREMITY N/A 11/14/2017   Procedure: ABDOMINAL AORTOGRAM W/LOWER EXTREMITY;  Surgeon: Waynetta Sandy, MD;  Location: [facility];  Service: Cardiovascular;  Laterality: N/A;   ANGIOPLASTY ILLIAC ARTERY Left 02/25/2018   Procedure: BALLOON ANGIOPLASTY LEFT ILIAC ARTERY;  Surgeon: Conrad Colfax, MD;  Location: [place];  Service: Vascular;  Laterality: Left;   APPLICATION OF WOUND VAC Left 03/03/2018   Procedure: APPLICATION OF WOUND VAC;  Surgeon: Angelia Mould, MD;  [facility];  Service: Vascular;  Laterality: Left;   CYSTOSCOPY W/ URETERAL STENT PLACEMENT Right 06/27/2018   Procedure: CYSTOSCOPY WITH RETROGRADE PYELOGRAM/URETERAL DOUBLE J STENT PLACEMENT;  Surgeon: Ceasar Mons, MD;  Location: [place];  Service: Urology;  Laterality: Right;   CYSTOSCOPY WITH RETROGRADE PYELOGRAM, URETEROSCOPY AND STENT PLACEMENT Right 04/03/2020   Procedure: CYSTOSCOPY WITH RIGHT  RETROGRADE PYELOGRAM, URETEROSCOPY WITH HOLMIUM LASER AND STENT EXCHANGE PLACEMENT;  Surgeon: Ardis Hughs, MD;  Location: [facility];  Service: Urology;  Laterality: Right;   CYSTOSCOPY WITH STENT PLACEMENT N/A 02/23/2019   Procedure: CYSTOSCOPY WITH RIGHT URETERAL STENT EXCHANGE/ LEFT URETERAL STENT PLACEMENT;  Surgeon: Ceasar Mons, MD;  [facility];  Service: Urology;  Laterality: N/A;   CYSTOSCOPY WITH STENT PLACEMENT Right 06/11/2019   Procedure:  CYSTOSCOPY,RIGHT RETROGRADE WITH STENT PLACEMENT;  Surgeon: Irine Seal, MD;  Location[facility];  Service: Urology;  Laterality: Right;   CYSTOSCOPY WITH STENT PLACEMENT Right 03/03/2020   Procedure: CYSTOSCOPY WITH STENT PLACEMENT retroagrade pylerogram;  Surgeon: Ardis Hughs, MD;  [facility];  Service: Urology;  Laterality: Right;   CYSTOSCOPY/RETROGRADE/URETEROSCOPY/STONE EXTRACTION WITH BASKET Bilateral 05/01/2019   Procedure: CYSTOSCOPY/URETEROSCOPY/STONE EXTRACTION / LASER LITHOTRIPSY, BILATERAL URETEROSCOPY WITH URETERAL STONE EXTRACTION AND STENT EXCHANGE;  Surgeon: Ceasar Mons, MD;  [facility];  Service: Urology;  Laterality: Bilateral;   CYSTOSCOPY/URETEROSCOPY/HOLMIUM LASER/STENT PLACEMENT Right 06/27/2019   Procedure: CYSTOSCOPY/URETEROSCOPY/HOLMIUM LASER/STENT EXCHANGE;  Surgeon: Ceasar Mons, MD;  [facility];  Service: Urology;  Laterality: Right;   EMBOLECTOMY Left 02/24/2017   Procedure: Left Lower Extremity Embolectomy and Angiogram.;  Surgeon: Waynetta Sandy, MD;  Location: [place];  Service: Vascular;  Laterality: Left;   INSERTION OF ILIAC STENT  02/24/2017   Procedure: INSERTION OF Common ILIAC STENT;  Surgeon: Waynetta Sandy, MD;  Location: [place];  Service: Vascular;;   INTRAOPERATIVE ARTERIOGRAM Left 02/25/2018   Procedure: INTRA OPERATIVE ARTERIOGRAM WITH LEFT LEG RUNOFF;  Surgeon: Conrad [place], MD;  Location: [place];  Service: Vascular;  Laterality: Left;   LOWER EXTREMITY ANGIOGRAM Left 02/24/2017   Procedure: Aortagram, Left lower extremity Run-off;  Surgeon: Waynetta Sandy, MD;  Location: [place];  Service: Vascular;  Laterality: Left;   LOWER EXTREMITY ANGIOGRAM Left 12/28/2020   Procedure: AORTOGRAM, LEFT LOWER  EXTREMITY ANGIOGRAM, THROMBOLYSIS VIA LEFT BRACHIAL ARTERY APPROACH;  Surgeon: Cherre Robins, MD;  Location: [place];  Service: Vascular;  Laterality: Left;   LOWER  EXTREMITY ANGIOGRAPHY N/A 12/29/2020   Procedure: LYSIS RECHECK;  Surgeon: Waynetta Sandy, MD;  Location: [facility];  Service: Cardiovascular;  Laterality: N/A;   PATCH ANGIOPLASTY Left 02/25/2018   Procedure: PATCH ANGIOPLASTY LEFT SUPERFICIAL FEMORAL ARTERY WITH BOVINE PATCH;  Surgeon: Conrad [place], MD;  Location: [place];  Service: Vascular;  Laterality: Left;   PERCUTANEOUS VENOUS THROMBECTOMY,LYSIS WITH INTRAVASCULAR ULTRASOUND (IVUS) Left 05/12/2018   Procedure: MECHANICAL THROMBECTOMY LEFT LEG, BALLOON ANGIOPLASTY LEFT ANTERIOR TIBIAL ARTERY, AORTOGRAM WITH LEFT LEG RUNOFF;  Surgeon: Serafina Mitchell, MD;  Location: [place];  Service: Vascular;  Laterality: Left;   PERIPHERAL VASCULAR THROMBECTOMY  12/29/2020   Procedure: PERIPHERAL VASCULAR THROMBECTOMY;  Surgeon: Waynetta Sandy, MD;  Location: [place] [facility];  Service: Cardiovascular;;   removal of parathyroid adenoma  5/11   THROMBECTOMY FEMORAL ARTERY Left 02/25/2018   Procedure: LEFT ILIAC AND POPLITEAL ARTERY THROMBECTOMY;  Surgeon: Conrad [place], MD;  Location: [place];  Service: Vascular;  Laterality: Left;   TUBAL LIGATION     WOUND DEBRIDEMENT Left 03/03/2018   Procedure: DEBRIDEMENT WOUND;  Surgeon: Angelia Mould, MD;  Location: [place];  Service: Vascular;  Laterality: Left;   WOUND EXPLORATION Left 03/03/2018   Procedure: WOUND EXPLORATION;  Surgeon: Angelia Mould, MD;  Location: [facility] OR;  Service: Vascular;  Laterality: Left;    ROS: Review of Systems Negative except as stated above  PHYSICAL EXAM: BP (!) 151/80 (BP Location: Right Arm, Patient Position: Sitting, Cuff Size: Large)   Pulse 85   Resp 16   Wt 258 lb 12.8 oz (117.4 kg)   SpO2 99%   BMI 40.53 kg/m   Wt Readings from Last 3 Encounters:  07/21/21 258 lb 12.8 oz (117.4 kg)  06/02/21 275 lb 9.2 oz (125 kg)  02/17/21 277 lb (125.6 kg)    Physical Exam  General appearance - alert, well appearing, and in no  distress Mental status - normal mood, behavior, speech, dress, motor activity, and thought processes Chest - clear to auscultation, no wheezes, rales or rhonchi, symmetric air entry Heart - normal rate, regular rhythm, normal S1, S2, no murmurs, rubs, clicks or gallops Extremities - peripheral pulses normal, no pedal edema, no clubbing or cyanosis   CMP Latest Ref Rng & Units 06/02/2021 02/17/2021 01/02/2021  Glucose 70 - 99 mg/dL 326(H) 66 287(H)  BUN 6 - 20 mg/dL 24(H) 19 14  Creatinine 0.44 - 1.00 mg/dL 2.26(H) 1.78(H) 1.97(H)  Sodium 135 - 145 mmol/L 132(L) 137 131(L)  Potassium 3.5 - 5.1 mmol/L 3.9 4.4 4.6  Chloride 98 - 111 mmol/L 102 103 101  CO2 22 - 32 mmol/L 19(L) 20 23  Calcium 8.9 - 10.3 mg/dL 9.1 10.1 9.2  Total Protein 6.5 - 8.1 g/dL 7.1 - -  Total Bilirubin 0.3 - 1.2 mg/dL 0.3 - -  Alkaline Phos 38 - 126 U/L 78 - -  AST 15 - 41 U/L 13(L) - -  ALT 0 - 44 U/L 11 - -   Lipid Panel     Component Value Date/Time   CHOL 208 (H) 07/29/2020 1528   TRIG 381 (H) 07/29/2020 1528   HDL 40 07/29/2020 1528   CHOLHDL 5.2 (H) 07/29/2020 1528   CHOLHDL 6.5 06/27/2018 0126   VLDL 47 (H) 06/27/2018 0126  LDLCALC 103 (H) 07/29/2020 1528    CBC    Component Value Date/Time   WBC 12.2 (H) 06/02/2021 0642   RBC 4.54 06/02/2021 0642   HGB 11.7 (L) 06/02/2021 0642   HGB 14.6 01/21/2020 1603   HCT 37.1 06/02/2021 0642   HCT 44.1 01/21/2020 1603   PLT 431 (H) 06/02/2021 0642   PLT 490 (H) 01/27/2018 1157   MCV 81.7 06/02/2021 0642   MCV 89 01/21/2020 1603   MCH 25.8 (L) 06/02/2021 0642   MCHC 31.5 06/02/2021 0642   RDW 16.7 (H) 06/02/2021 0642   RDW 13.5 01/21/2020 1603   LYMPHSABS 3.3 06/02/2021 0642   LYMPHSABS 3.1 01/21/2020 1603   MONOABS 0.7 06/02/2021 0642   EOSABS 0.4 06/02/2021 0642   EOSABS 0.4 01/21/2020 1603   BASOSABS 0.1 06/02/2021 0642   BASOSABS 0.1 01/21/2020 1603    ASSESSMENT AND PLAN: 1. History of arterial thrombosis Discussed with patient the  importance of follow-up for INR checks while on Coumadin.  This is a high risk medication.  I discussed with the clinical pharmacist the possibility of changing her to a DOAC however her GFR fluctuates quite a bit and right now it is in the 20s on last chemistry.  I will hold off making the switch because of that.  I had our caseworker touch base with her today while she was in the clinic to discuss transportation issues so that it is no longer a barrier. -She will restart the Coumadin at her previous dose and follow-up with the clinical pharmacist in 2 weeks.  She expressed understanding and is agreeable to this plan. - warfarin (COUMADIN) 5 MG tablet; TAKE 5 MG BY MOUTH AT NOON ON SUN/TUES/THURS/SAT AND 10 MG ON MON/WED/FRI  Dispense: 60 tablet; Refill: 1 - CBC - INR  2. Type 2 diabetes mellitus with morbid obesity (HCC) Not at goal.  She does not have any blood sugar readings to share with me at this time.  Prescription sent to her pharmacy for the [place] meter.  Follow-up in 4 weeks with some blood sugar readings so that we can make adjustments to her insulin.  She is agreeable to referral for eye exam and to the endocrinologist -Encourage healthy eating habits. - POCT glucose (manual entry) - POCT glycosylated hemoglobin (Hb A1C) - Ambulatory referral to Endocrinology - Ambulatory referral to Ophthalmology - Accu-Chek Softclix Lancets lancets; Use as instructed  Dispense: 100 each; Refill: 0 - glucose blood (ACCU-CHEK GUIDE) test strip; Use as instructed  Dispense: 100 each; Refill: 0 - Lipid panel - Comprehensive metabolic panel - Continuous Blood Gluc Receiver (FREESTYLE LIBRE READER) DEVI; 1 Device by Does not apply route once for 1 dose.  Dispense: 1 each; Refill: 6 - Continuous Blood Gluc Sensor (FREESTYLE LIBRE SENSOR SYSTEM) MISC; Change sensor Q 2 wks  Dispense: 2 each; Refill: 12 - insulin glargine (LANTUS SOLOSTAR) 100 UNIT/ML Solostar Pen; Inject 65 Units into the skin daily.   Dispense: 15 mL; Refill: 7  3. Hypertension associated with diabetes ([place]) Not at goal.  She has not taken her medications as yet for today.  She will take them when she returns home.  4. Tobacco dependence Advised to quit.  She is aware of health risks associated with smoking.  Refill given on nicotine patch 21 mg - nicotine (NICODERM CQ - DOSED IN MG/24 HOURS) 21 mg/24hr patch; PLACE 1 PATCH (21 MG TOTAL) ONTO THE SKIN DAILY.  Dispense: 28 patch; Refill: 2  5. Recurrent vomiting We  will check a gastric emptying study.  She could have gastroparesis versus hyperemesis associated with marijuana use - NM Gastric Emptying; Future - Ambulatory referral to Gastroenterology  6. Stage 3b chronic kidney disease (HCC) - Comprehensive metabolic panel  7. Screening for colon cancer Discussed colon cancer screening.  She prefers to have a colonoscopy. - Ambulatory referral to Gastroenterology  8. Influenza vaccination declined Recommended.  Patient declined.  Addendum: Despite my discussion with patient about Coumadin and need for regular follow-up with the clinical pharmacist for INR check, and me telling her to stop at the front desk to get her follow-up appointment with the clinical pharmacist in 2 weeks and with me in 6 weeks, it appears the patient still left without stopping at the front desk to get her follow-up appointment.  I will have our front desk call her and give her these appointments.  I tried calling her on her cell phone after she left but there was no answer.  Patient was given the opportunity to ask questions.  Patient verbalized understanding of the plan and was able to repeat key elements of the plan.   Orders Placed This Encounter  Procedures   NM Gastric Emptying   CBC   Lipid panel   Comprehensive metabolic panel   Ambulatory referral to Endocrinology   Ambulatory referral to Ophthalmology   Ambulatory referral to Gastroenterology   POCT glucose (manual entry)    POCT glycosylated hemoglobin (Hb A1C)   INR     Requested Prescriptions   Signed Prescriptions Disp Refills   nicotine (NICODERM CQ - DOSED IN MG/24 HOURS) 21 mg/24hr patch 28 patch 2    Sig: PLACE 1 PATCH (21 MG TOTAL) ONTO THE SKIN DAILY.   warfarin (COUMADIN) 5 MG tablet 60 tablet 1    Sig: TAKE 5 MG BY MOUTH AT NOON ON SUN/TUES/THURS/SAT AND 10 MG ON MON/WED/FRI   Accu-Chek Softclix Lancets lancets 100 each 0    Sig: Use as instructed   glucose blood (ACCU-CHEK GUIDE) test strip 100 each 0    Sig: Use as instructed   Continuous Blood Gluc Receiver (FREESTYLE LIBRE READER) DEVI 1 each 6    Sig: 1 Device by Does not apply route once for 1 dose.   Continuous Blood Gluc Sensor (FREESTYLE LIBRE SENSOR SYSTEM) MISC 2 each 12    Sig: Change sensor Q 2 wks   insulin glargine (LANTUS SOLOSTAR) 100 UNIT/ML Solostar Pen 15 mL 7    Sig: Inject 65 Units into the skin daily.    Return in about 6 weeks (around 09/01/2021) for Give appt with [facility] in 2 wks for INR check.  Karle Plumber, MD, FACP

## 2021-07-21 NOTE — Telephone Encounter (Signed)
It looks like she has no showed several appointments and needs INR for Coumadin to be dosed accordingly.  She has an office visit today at which time her concerns will be addressed.

## 2021-07-21 NOTE — Telephone Encounter (Signed)
Provider was contacted by Lind Covert, CMA regarding patient being out of her Klonopin. Patient's prescription was last filled on 06/27/2021, therefore, patient should not be out until 30 days after last filled date. Patient's next follow up appointment is scheduled for tomorrow. Patient's concerns to be addressed then.

## 2021-07-21 NOTE — Telephone Encounter (Signed)
Patient was last prescribed Klonopin on 06/27/2021. Patient's medication should not be out until October 24th, 2022. Provider to continue filling medication 30 days out from previous script. Patient has a follow up appointment scheduled for tomorrow. Provider to discuss any of the patient's concerns then.

## 2021-07-21 NOTE — Telephone Encounter (Signed)
Met with the patient when she was in the clinic today and provided her with information about transportation [facility] # [phone] and  [facility] # [phone]. Encouraged her to call [employer] for appointments when she has a 2-3 day notice.  [facility] [employer] can be used if a ride can't be arranged with her insurance company.    Call placed to [facility] and placed referral with Mo. She said that they will reach out to the patient and make sure that she has their phone number

## 2021-07-22 ENCOUNTER — Telehealth (INDEPENDENT_AMBULATORY_CARE_PROVIDER_SITE_OTHER): Payer: Medicaid Other | Admitting: Physician Assistant

## 2021-07-22 ENCOUNTER — Telehealth: Payer: Self-pay | Admitting: Internal Medicine

## 2021-07-22 ENCOUNTER — Telehealth: Payer: Self-pay

## 2021-07-22 DIAGNOSIS — F411 Generalized anxiety disorder: Secondary | ICD-10-CM

## 2021-07-22 DIAGNOSIS — F339 Major depressive disorder, recurrent, unspecified: Secondary | ICD-10-CM

## 2021-07-22 DIAGNOSIS — G4700 Insomnia, unspecified: Secondary | ICD-10-CM

## 2021-07-22 DIAGNOSIS — E1169 Type 2 diabetes mellitus with other specified complication: Secondary | ICD-10-CM

## 2021-07-22 LAB — COMPREHENSIVE METABOLIC PANEL
ALT: 9 IU/L (ref 0–32)
AST: 8 IU/L (ref 0–40)
Albumin/Globulin Ratio: 1.2 (ref 1.2–2.2)
Albumin: 4 g/dL (ref 3.8–4.8)
Alkaline Phosphatase: 116 IU/L (ref 44–121)
BUN/Creatinine Ratio: 8 — ABNORMAL LOW (ref 9–23)
BUN: 13 mg/dL (ref 6–24)
Bilirubin Total: <0.2 mg/dL (ref 0.0–1.2)
CO2: 20 mmol/L (ref 20–29)
Calcium: 9.4 mg/dL (ref 8.7–10.2)
Chloride: 104 mmol/L (ref 96–106)
Creatinine, Ser: 1.62 mg/dL — ABNORMAL HIGH (ref 0.57–1.00)
Globulin, Total: 3.3 g/dL (ref 1.5–4.5)
Glucose: 247 mg/dL — ABNORMAL HIGH (ref 70–99)
Potassium: 4.8 mmol/L (ref 3.5–5.2)
Sodium: 138 mmol/L (ref 134–144)
Total Protein: 7.3 g/dL (ref 6.0–8.5)
eGFR: 39 mL/min/{1.73_m2} — ABNORMAL LOW (ref >59)

## 2021-07-22 LAB — CBC
Hematocrit: 36.8 % (ref 34.0–46.6)
Hemoglobin: 11.8 g/dL (ref 11.1–15.9)
MCH: 25.5 pg — ABNORMAL LOW (ref 26.6–33.0)
MCHC: 32.1 g/dL (ref 31.5–35.7)
MCV: 80 fL (ref 79–97)
Platelets: 439 10*3/uL (ref 150–450)
RBC: 4.63 x10E6/uL (ref 3.77–5.28)
RDW: 16.2 % — ABNORMAL HIGH (ref 11.7–15.4)
WBC: 8.9 10*3/uL (ref 3.4–10.8)

## 2021-07-22 LAB — LIPID PANEL
Chol/HDL Ratio: 4.3 ratio (ref 0.0–4.4)
Cholesterol, Total: 152 mg/dL (ref 100–199)
HDL: 35 mg/dL — ABNORMAL LOW (ref >39)
LDL Chol Calc (NIH): 85 mg/dL (ref 0–99)
Triglycerides: 188 mg/dL — ABNORMAL HIGH (ref 0–149)
VLDL Cholesterol Cal: 32 mg/dL (ref 5–40)

## 2021-07-22 MED ORDER — TRAZODONE HCL 100 MG PO TABS: 100.0000 mg | ORAL_TABLET | Freq: Every day | ORAL | 1 refills | Status: DC

## 2021-07-22 MED ORDER — HYDROXYZINE HCL 50 MG PO TABS: 50.0000 mg | ORAL_TABLET | Freq: Three times a day (TID) | ORAL | 1 refills | Status: DC | PRN

## 2021-07-22 MED ORDER — SERTRALINE HCL 100 MG PO TABS: ORAL_TABLET | ORAL | 1 refills | Status: DC

## 2021-07-22 MED ORDER — BUPROPION HCL ER (SR) 150 MG PO TB12: 150.0000 mg | ORAL_TABLET | Freq: Two times a day (BID) | ORAL | 2 refills | Status: DC

## 2021-07-22 MED ORDER — ATORVASTATIN CALCIUM 40 MG PO TABS: 40.0000 mg | ORAL_TABLET | Freq: Every day | ORAL | 3 refills | Status: AC

## 2021-07-22 NOTE — Telephone Encounter (Signed)
Patient called and advised the Proair inhaler Rx was sent to [place] on 07/14/21. She says she needs Atorvastatin, advised that was sent today. She says that she called about the [facility] and told someone that her insurance will not pay for it. I called [facility] and spoke to Christy Sartorius, [facility] who verified receipt of Atorvastatin today and Proair on 07/14/21 and both will be delivered to the patient tomorrow. He says the insurance is requiring a PA for the [employer]. I advised I will notify the provider of this PA needed.

## 2021-07-22 NOTE — Telephone Encounter (Signed)
Copied from [place] [phone]. Topic: Quick Communication - Rx Refill/Question >> Jul 22, 2021  2:39 PM Terri Sparks wrote: Medication: PROAIR HFA 108 (90 Base) MCG/ACT inhaler   Has the patient contacted their pharmacy? Yes.   (Agent: If no, request that the patient contact the pharmacy for the refill.) (Agent: If yes, when and what did the pharmacy advise?)  Preferred Pharmacy (with phone number or street name): [place], [place]  Phone:  [phone] Fax:  [phone]    Has the patient been seen for an appointment in the last year OR does the patient have an upcoming appointment? Yes.    Agent: Please be advised that RX refills may take up to 3 business days. We ask that you follow-up with your pharmacy.

## 2021-07-22 NOTE — Telephone Encounter (Signed)
Contacted pt to go over lab results pt is aware
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
## 2021-07-23 ENCOUNTER — Other Ambulatory Visit: Payer: Self-pay | Admitting: Pharmacist

## 2021-07-23 ENCOUNTER — Telehealth: Payer: Self-pay

## 2021-07-23 ENCOUNTER — Other Ambulatory Visit: Payer: Self-pay

## 2021-07-23 DIAGNOSIS — E1169 Type 2 diabetes mellitus with other specified complication: Secondary | ICD-10-CM

## 2021-07-23 MED ORDER — FREESTYLE LIBRE 2 SENSOR MISC: 2 refills | Status: DC

## 2021-07-23 MED ORDER — FREESTYLE LIBRE 2 READER DEVI: 2 refills | Status: AC

## 2021-07-23 NOTE — Telephone Encounter (Signed)
Terri Sparks looks like pt is needing a PA for meter. Would you be able to check into this

## 2021-07-23 NOTE — Telephone Encounter (Signed)
PA for [employer] 2 approved until 01/19/22.

## 2021-07-23 NOTE — Telephone Encounter (Signed)
Reached out to [facility] to see if he is able to send rx in. Per Lurena Joiner he has already sent rxs into [facility]

## 2021-07-24 ENCOUNTER — Telehealth (HOSPITAL_COMMUNITY): Payer: Self-pay | Admitting: Licensed Clinical Social Worker

## 2021-07-24 ENCOUNTER — Ambulatory Visit (HOSPITAL_COMMUNITY): Payer: Medicaid Other | Admitting: Licensed Clinical Social Worker

## 2021-07-24 ENCOUNTER — Other Ambulatory Visit: Payer: Self-pay

## 2021-07-24 NOTE — Telephone Encounter (Signed)
LCSW sent text message with link for video session per schedule. LCSW remained online available for session until 10:12. Pt failed to sign on for session.

## 2021-07-25 ENCOUNTER — Encounter (HOSPITAL_COMMUNITY): Payer: Self-pay | Admitting: Physician Assistant

## 2021-07-29 ENCOUNTER — Other Ambulatory Visit: Payer: Self-pay | Admitting: Internal Medicine

## 2021-07-29 NOTE — Telephone Encounter (Signed)
Medication Refill - Medication:  gabapentin (NEURONTIN) 400 MG capsule   Has the patient contacted their pharmacy? Yes Contact PCP   Preferred Pharmacy (with phone number or street name):  [place], [place] Phone:  [phone]  Fax:  [phone]      Has the patient been seen for an appointment in the last year OR does the patient have an upcoming appointment? Yes.    Agent: Please be advised that RX refills may take up to 3 business days. We ask that you follow-up with your pharmacy.

## 2021-07-30 MED ORDER — GABAPENTIN 400 MG PO CAPS: 400.0000 mg | ORAL_CAPSULE | Freq: Three times a day (TID) | ORAL | 0 refills | Status: DC

## 2021-07-30 NOTE — Telephone Encounter (Signed)
Requested Prescriptions  Pending Prescriptions Disp Refills  . gabapentin (NEURONTIN) 400 MG capsule 90 capsule 0    Sig: Take 1 capsule (400 mg total) by mouth 3 (three) times daily.     Neurology: Anticonvulsants - gabapentin Passed - 07/29/2021  5:33 PM      Passed - Valid encounter within last 12 months    Recent Outpatient Visits          1 week ago History of arterial thrombosis   [place], MD   3 months ago No-show for appointment   [place] Karle Plumber B, MD   5 months ago Type 2 diabetes mellitus with morbid obesity [facility])   [place], MD   6 months ago Essential hypertension   [place], MD   1 year ago Preoperative evaluation to rule out surgical contraindication   [place], MD      Future Appointments            In 1 month Wynetta Emery Dalbert Batman, MD [place]

## 2021-08-04 ENCOUNTER — Other Ambulatory Visit: Payer: Self-pay | Admitting: Internal Medicine

## 2021-08-04 DIAGNOSIS — E1169 Type 2 diabetes mellitus with other specified complication: Secondary | ICD-10-CM

## 2021-08-04 NOTE — Telephone Encounter (Signed)
.   Requested Prescriptions  Pending Prescriptions Disp Refills  . NOVOLOG FLEXPEN 100 UNIT/ML FlexPen [Pharmacy Med Name: NOVOLOG FLEXPEN 100 UNIT/ML SUBCUTANEOUS SOLUTION PEN-INJECTOR] 15 mL 5    Sig: Use as directed per sliding scale, Pt is instructed about direction, Maximum 45 units per day  CBG 70 - 120: 0 units CBG 121 - 150: 2 units CBG 151 - 200: 3 units CBG 201 - 250: 5 units CBG 251 - 300: 8 units CBG 301 - 350: 11 units CBG 351 - 400: 15 units CBG > 400: Call Primary provider     Endocrinology:  Diabetes - Insulins Failed - 08/04/2021 10:44 AM      Failed - HBA1C is between 0 and 7.9 and within 180 days    HbA1c, POC (controlled diabetic range)  Date Value Ref Range Status  07/21/2021 9.5 (A) 0.0 - 7.0 % Final         Passed - Valid encounter within last 6 months    Recent Outpatient Visits          2 weeks ago History of arterial thrombosis   [place], MD   3 months ago No-show for appointment   [place] Karle Plumber B, MD   5 months ago Type 2 diabetes mellitus with morbid obesity [facility])   [place], MD   6 months ago Essential hypertension   [place], MD   1 year ago Preoperative evaluation to rule out surgical contraindication   [place], MD      Future Appointments            In 1 month Wynetta Emery Dalbert Batman, MD [place]

## 2021-08-19 ENCOUNTER — Telehealth (HOSPITAL_COMMUNITY): Payer: Self-pay | Admitting: *Deleted

## 2021-08-19 ENCOUNTER — Other Ambulatory Visit: Payer: Self-pay | Admitting: Internal Medicine

## 2021-08-19 ENCOUNTER — Other Ambulatory Visit (HOSPITAL_COMMUNITY): Payer: Self-pay | Admitting: Physician Assistant

## 2021-08-19 DIAGNOSIS — F339 Major depressive disorder, recurrent, unspecified: Secondary | ICD-10-CM

## 2021-08-19 DIAGNOSIS — Z86718 Personal history of other venous thrombosis and embolism: Secondary | ICD-10-CM

## 2021-08-19 DIAGNOSIS — F411 Generalized anxiety disorder: Secondary | ICD-10-CM

## 2021-08-19 NOTE — Telephone Encounter (Signed)
Patient called for her klonopin dose to be 1 mg. I dont see a rx on her med list for it but it is referenced in her last note per Trinna Post PA. Will forward the message to provider, she says her refills are for half the amount she is used to taking.

## 2021-08-19 NOTE — Telephone Encounter (Signed)
Opened a second time in error. 

## 2021-08-19 NOTE — Telephone Encounter (Signed)
Requested medications are due for refill today.  yes  Requested medications are on the active medications list.  yes  Last refill. 07/21/2021  Future visit scheduled.   yes  Notes to clinic.  Medication not delegated.

## 2021-08-20 NOTE — Telephone Encounter (Signed)
Patient's medication e-prescribed to pharmacy of choice.

## 2021-08-21 NOTE — Telephone Encounter (Signed)
Provider contacted by Glory Buff. Olevia Bowens, RN regarding patient's Klonopin prescription. Patient's medication to be e-prescribed to pharmacy of choice.

## 2021-08-31 ENCOUNTER — Other Ambulatory Visit: Payer: Self-pay | Admitting: Internal Medicine

## 2021-09-08 ENCOUNTER — Ambulatory Visit: Payer: Medicaid Other | Admitting: Internal Medicine

## 2021-09-10 ENCOUNTER — Other Ambulatory Visit (HOSPITAL_COMMUNITY): Payer: Self-pay | Admitting: Physician Assistant

## 2021-09-10 DIAGNOSIS — F411 Generalized anxiety disorder: Secondary | ICD-10-CM

## 2021-09-10 DIAGNOSIS — G4700 Insomnia, unspecified: Secondary | ICD-10-CM

## 2021-09-10 DIAGNOSIS — F339 Major depressive disorder, recurrent, unspecified: Secondary | ICD-10-CM

## 2021-09-15 ENCOUNTER — Other Ambulatory Visit: Payer: Self-pay | Admitting: Internal Medicine

## 2021-09-15 ENCOUNTER — Other Ambulatory Visit (HOSPITAL_COMMUNITY): Payer: Self-pay | Admitting: Physician Assistant

## 2021-09-15 DIAGNOSIS — F411 Generalized anxiety disorder: Secondary | ICD-10-CM

## 2021-09-15 DIAGNOSIS — F17200 Nicotine dependence, unspecified, uncomplicated: Secondary | ICD-10-CM

## 2021-09-15 NOTE — Telephone Encounter (Signed)
Requested Prescriptions  Pending Prescriptions Disp Refills   nicotine (NICODERM CQ - DOSED IN MG/24 HOURS) 21 mg/24hr patch [Pharmacy Med Name: NICOTINE 21 MG/24HR TRANSDERMAL PATCH 24 HOUR] 28 patch 2    Sig: PLACE 1 PATCH (21 MG TOTAL) ONTO THE SKIN DAILY.     Psychiatry:  Drug Dependence Therapy Passed - 09/15/2021 11:46 AM      Passed - Valid encounter within last 12 months    Recent Outpatient Visits          1 month ago History of arterial thrombosis   [place], MD   4 months ago No-show for appointment   [place] Karle Plumber B, MD   7 months ago Type 2 diabetes mellitus with morbid obesity [facility])   [place] Ladell Pier, MD   8 months ago Essential hypertension   [place], Patrick E, MD   1 year ago Preoperative evaluation to rule out surgical contraindication   [facility] Ladell Pier, MD

## 2021-09-16 ENCOUNTER — Telehealth: Payer: Self-pay | Admitting: Vascular Surgery

## 2021-09-16 NOTE — Telephone Encounter (Signed)
No vascular appointments available. MD made aware and recommended to inform patient of this information and if symptoms are too severe, she would need to proceed to the ER for evaluation. Not sure [facility] transportation will transport to the ER and if patient calls EMS, everything may be determined okay once evaluated.   Attempted to reach patient at both numbers, no answer. Left message to return call to office.

## 2021-09-16 NOTE — Telephone Encounter (Signed)
This patient called this morning with pain in her left leg.  She was not a good historian.  She said she had a charley horse in her leg.  The leg would be warm at times and then would be cold at times.  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  However when I reviewed the notes she is also had arterial problems.  I think the best thing would be for her to get a left lower extremity venous duplex to rule out a DVT and also get ABIs in the office.  The problem is that she does not have a ride.  I told her that we would try to sort out how we might be able to get her to the office when the office opens at 9.  We can discuss this further when I am in the office this morning.  Thank you.

## 2021-09-17 ENCOUNTER — Emergency Department (HOSPITAL_COMMUNITY): Payer: Medicaid Other

## 2021-09-17 ENCOUNTER — Encounter (HOSPITAL_COMMUNITY)
Admission: EM | Disposition: A | Discharge status: Home / Self Care | Payer: Self-pay | Source: Home / Self Care | Attending: Vascular Surgery

## 2021-09-17 ENCOUNTER — Other Ambulatory Visit: Payer: Self-pay

## 2021-09-17 ENCOUNTER — Encounter (HOSPITAL_COMMUNITY): Payer: Self-pay | Admitting: *Deleted

## 2021-09-17 ENCOUNTER — Inpatient Hospital Stay (HOSPITAL_COMMUNITY)
Admission: EM | Admit: 2021-09-17 | Discharge: 2021-09-26 | DRG: 270 | Disposition: A | Discharge status: Home / Self Care | Payer: Medicaid Other | Attending: Vascular Surgery | Admitting: Vascular Surgery

## 2021-09-17 DIAGNOSIS — Z882 Allergy status to sulfonamides status: Secondary | ICD-10-CM

## 2021-09-17 DIAGNOSIS — F32A Depression, unspecified: Secondary | ICD-10-CM | POA: Diagnosis present

## 2021-09-17 DIAGNOSIS — D500 Iron deficiency anemia secondary to blood loss (chronic): Secondary | ICD-10-CM | POA: Diagnosis present

## 2021-09-17 DIAGNOSIS — E1151 Type 2 diabetes mellitus with diabetic peripheral angiopathy without gangrene: Principal | ICD-10-CM | POA: Diagnosis present

## 2021-09-17 DIAGNOSIS — I10 Essential (primary) hypertension: Secondary | ICD-10-CM | POA: Diagnosis not present

## 2021-09-17 DIAGNOSIS — R1310 Dysphagia, unspecified: Secondary | ICD-10-CM | POA: Diagnosis present

## 2021-09-17 DIAGNOSIS — E871 Hypo-osmolality and hyponatremia: Secondary | ICD-10-CM | POA: Diagnosis present

## 2021-09-17 DIAGNOSIS — Z716 Tobacco abuse counseling: Secondary | ICD-10-CM

## 2021-09-17 DIAGNOSIS — Z86718 Personal history of other venous thrombosis and embolism: Secondary | ICD-10-CM | POA: Diagnosis not present

## 2021-09-17 DIAGNOSIS — E1165 Type 2 diabetes mellitus with hyperglycemia: Secondary | ICD-10-CM | POA: Diagnosis present

## 2021-09-17 DIAGNOSIS — Z833 Family history of diabetes mellitus: Secondary | ICD-10-CM

## 2021-09-17 DIAGNOSIS — F411 Generalized anxiety disorder: Secondary | ICD-10-CM

## 2021-09-17 DIAGNOSIS — R531 Weakness: Secondary | ICD-10-CM | POA: Diagnosis not present

## 2021-09-17 DIAGNOSIS — E1169 Type 2 diabetes mellitus with other specified complication: Secondary | ICD-10-CM

## 2021-09-17 DIAGNOSIS — I236 Thrombosis of atrium, auricular appendage, and ventricle as current complications following acute myocardial infarction: Secondary | ICD-10-CM | POA: Diagnosis not present

## 2021-09-17 DIAGNOSIS — M25572 Pain in left ankle and joints of left foot: Secondary | ICD-10-CM | POA: Diagnosis not present

## 2021-09-17 DIAGNOSIS — N179 Acute kidney failure, unspecified: Secondary | ICD-10-CM | POA: Diagnosis not present

## 2021-09-17 DIAGNOSIS — F17210 Nicotine dependence, cigarettes, uncomplicated: Secondary | ICD-10-CM | POA: Diagnosis present

## 2021-09-17 DIAGNOSIS — Z79899 Other long term (current) drug therapy: Secondary | ICD-10-CM

## 2021-09-17 DIAGNOSIS — N1831 Chronic kidney disease, stage 3a: Secondary | ICD-10-CM | POA: Diagnosis not present

## 2021-09-17 DIAGNOSIS — Z7901 Long term (current) use of anticoagulants: Secondary | ICD-10-CM

## 2021-09-17 DIAGNOSIS — Z8249 Family history of ischemic heart disease and other diseases of the circulatory system: Secondary | ICD-10-CM

## 2021-09-17 DIAGNOSIS — Z6839 Body mass index (BMI) 39.0-39.9, adult: Secondary | ICD-10-CM

## 2021-09-17 DIAGNOSIS — Z87442 Personal history of urinary calculi: Secondary | ICD-10-CM

## 2021-09-17 DIAGNOSIS — N920 Excessive and frequent menstruation with regular cycle: Secondary | ICD-10-CM | POA: Diagnosis present

## 2021-09-17 DIAGNOSIS — L02211 Cutaneous abscess of abdominal wall: Secondary | ICD-10-CM | POA: Diagnosis not present

## 2021-09-17 DIAGNOSIS — D6859 Other primary thrombophilia: Secondary | ICD-10-CM | POA: Diagnosis present

## 2021-09-17 DIAGNOSIS — T82868A Thrombosis of vascular prosthetic devices, implants and grafts, initial encounter: Secondary | ICD-10-CM | POA: Diagnosis not present

## 2021-09-17 DIAGNOSIS — I70229 Atherosclerosis of native arteries of extremities with rest pain, unspecified extremity: Secondary | ICD-10-CM | POA: Diagnosis present

## 2021-09-17 DIAGNOSIS — K5900 Constipation, unspecified: Secondary | ICD-10-CM | POA: Diagnosis present

## 2021-09-17 DIAGNOSIS — D72829 Elevated white blood cell count, unspecified: Secondary | ICD-10-CM | POA: Diagnosis present

## 2021-09-17 DIAGNOSIS — E119 Type 2 diabetes mellitus without complications: Secondary | ICD-10-CM

## 2021-09-17 DIAGNOSIS — Z794 Long term (current) use of insulin: Secondary | ICD-10-CM | POA: Diagnosis not present

## 2021-09-17 DIAGNOSIS — R739 Hyperglycemia, unspecified: Secondary | ICD-10-CM | POA: Diagnosis not present

## 2021-09-17 DIAGNOSIS — Z888 Allergy status to other drugs, medicaments and biological substances status: Secondary | ICD-10-CM

## 2021-09-17 DIAGNOSIS — Z9109 Other allergy status, other than to drugs and biological substances: Secondary | ICD-10-CM

## 2021-09-17 DIAGNOSIS — Z79891 Long term (current) use of opiate analgesic: Secondary | ICD-10-CM

## 2021-09-17 DIAGNOSIS — F339 Major depressive disorder, recurrent, unspecified: Secondary | ICD-10-CM | POA: Diagnosis not present

## 2021-09-17 DIAGNOSIS — Z7982 Long term (current) use of aspirin: Secondary | ICD-10-CM | POA: Diagnosis not present

## 2021-09-17 DIAGNOSIS — I749 Embolism and thrombosis of unspecified artery: Secondary | ICD-10-CM

## 2021-09-17 DIAGNOSIS — I70222 Atherosclerosis of native arteries of extremities with rest pain, left leg: Secondary | ICD-10-CM | POA: Diagnosis present

## 2021-09-17 DIAGNOSIS — I743 Embolism and thrombosis of arteries of the lower extremities: Secondary | ICD-10-CM | POA: Diagnosis not present

## 2021-09-17 DIAGNOSIS — E785 Hyperlipidemia, unspecified: Secondary | ICD-10-CM | POA: Diagnosis present

## 2021-09-17 DIAGNOSIS — I129 Hypertensive chronic kidney disease with stage 1 through stage 4 chronic kidney disease, or unspecified chronic kidney disease: Secondary | ICD-10-CM | POA: Diagnosis present

## 2021-09-17 DIAGNOSIS — E1122 Type 2 diabetes mellitus with diabetic chronic kidney disease: Secondary | ICD-10-CM | POA: Diagnosis present

## 2021-09-17 DIAGNOSIS — U071 COVID-19: Secondary | ICD-10-CM | POA: Diagnosis not present

## 2021-09-17 DIAGNOSIS — R059 Cough, unspecified: Secondary | ICD-10-CM | POA: Diagnosis not present

## 2021-09-17 DIAGNOSIS — F17200 Nicotine dependence, unspecified, uncomplicated: Secondary | ICD-10-CM

## 2021-09-17 DIAGNOSIS — J449 Chronic obstructive pulmonary disease, unspecified: Secondary | ICD-10-CM | POA: Diagnosis present

## 2021-09-17 DIAGNOSIS — K219 Gastro-esophageal reflux disease without esophagitis: Secondary | ICD-10-CM | POA: Diagnosis present

## 2021-09-17 DIAGNOSIS — I739 Peripheral vascular disease, unspecified: Secondary | ICD-10-CM | POA: Diagnosis present

## 2021-09-17 DIAGNOSIS — F419 Anxiety disorder, unspecified: Secondary | ICD-10-CM | POA: Diagnosis present

## 2021-09-17 DIAGNOSIS — Z883 Allergy status to other anti-infective agents status: Secondary | ICD-10-CM

## 2021-09-17 DIAGNOSIS — E669 Obesity, unspecified: Secondary | ICD-10-CM | POA: Diagnosis present

## 2021-09-17 DIAGNOSIS — M79605 Pain in left leg: Secondary | ICD-10-CM | POA: Diagnosis not present

## 2021-09-17 DIAGNOSIS — Z960 Presence of urogenital implants: Secondary | ICD-10-CM | POA: Diagnosis present

## 2021-09-17 DIAGNOSIS — Z880 Allergy status to penicillin: Secondary | ICD-10-CM

## 2021-09-17 HISTORY — PX: LOWER EXTREMITY ANGIOGRAPHY: CATH118251

## 2021-09-17 HISTORY — PX: PERIPHERAL VASCULAR THROMBECTOMY: CATH118306

## 2021-09-17 LAB — HEPARIN LEVEL (UNFRACTIONATED): Heparin Unfractionated: <0.1 IU/mL — ABNORMAL LOW (ref 0.30–0.70)

## 2021-09-17 LAB — PROCALCITONIN: Procalcitonin: <0.1 ng/mL

## 2021-09-17 LAB — FERRITIN: Ferritin: 12 ng/mL (ref 11–307)

## 2021-09-17 LAB — COMPREHENSIVE METABOLIC PANEL
ALT: 13 U/L (ref 0–44)
AST: 12 U/L — ABNORMAL LOW (ref 15–41)
Albumin: 2.9 g/dL — ABNORMAL LOW (ref 3.5–5.0)
Alkaline Phosphatase: 93 U/L (ref 38–126)
Anion gap: 7 (ref 5–15)
BUN: 13 mg/dL (ref 6–20)
CO2: 25 mmol/L (ref 22–32)
Calcium: 9.3 mg/dL (ref 8.9–10.3)
Chloride: 98 mmol/L (ref 98–111)
Creatinine, Ser: 1.67 mg/dL — ABNORMAL HIGH (ref 0.44–1.00)
GFR, Estimated: 38 mL/min — ABNORMAL LOW (ref >60)
Glucose, Bld: 396 mg/dL — ABNORMAL HIGH (ref 70–99)
Potassium: 4.1 mmol/L (ref 3.5–5.1)
Sodium: 130 mmol/L — ABNORMAL LOW (ref 135–145)
Total Bilirubin: 0.2 mg/dL — ABNORMAL LOW (ref 0.3–1.2)
Total Protein: 7.3 g/dL (ref 6.5–8.1)

## 2021-09-17 LAB — CBC
HCT: 33.7 % — ABNORMAL LOW (ref 36.0–46.0)
HCT: 33.4 % — ABNORMAL LOW (ref 36.0–46.0)
Hemoglobin: 10.5 g/dL — ABNORMAL LOW (ref 12.0–15.0)
Hemoglobin: 10.2 g/dL — ABNORMAL LOW (ref 12.0–15.0)
MCH: 24.9 pg — ABNORMAL LOW (ref 26.0–34.0)
MCH: 24.5 pg — ABNORMAL LOW (ref 26.0–34.0)
MCHC: 31.2 g/dL (ref 30.0–36.0)
MCHC: 30.5 g/dL (ref 30.0–36.0)
MCV: 79.9 fL — ABNORMAL LOW (ref 80.0–100.0)
MCV: 80.3 fL (ref 80.0–100.0)
Platelets: 486 10*3/uL — ABNORMAL HIGH (ref 150–400)
Platelets: 426 10*3/uL — ABNORMAL HIGH (ref 150–400)
RBC: 4.22 MIL/uL (ref 3.87–5.11)
RBC: 4.16 MIL/uL (ref 3.87–5.11)
RDW: 17.1 % — ABNORMAL HIGH (ref 11.5–15.5)
RDW: 17 % — ABNORMAL HIGH (ref 11.5–15.5)
WBC: 15.5 10*3/uL — ABNORMAL HIGH (ref 4.0–10.5)
WBC: 16.4 10*3/uL — ABNORMAL HIGH (ref 4.0–10.5)
nRBC: 0 % (ref 0.0–0.2)
nRBC: 0 % (ref 0.0–0.2)

## 2021-09-17 LAB — HEMOGLOBIN A1C
Hgb A1c MFr Bld: 10.6 % — ABNORMAL HIGH (ref 4.8–5.6)
Mean Plasma Glucose: 257.52 mg/dL

## 2021-09-17 LAB — GLUCOSE, CAPILLARY: Glucose-Capillary: 219 mg/dL — ABNORMAL HIGH (ref 70–99)

## 2021-09-17 LAB — LACTATE DEHYDROGENASE: LDH: 119 U/L (ref 98–192)

## 2021-09-17 LAB — CBC WITH DIFFERENTIAL/PLATELET
Abs Immature Granulocytes: 0.08 10*3/uL — ABNORMAL HIGH (ref 0.00–0.07)
Basophils Absolute: 0.1 10*3/uL (ref 0.0–0.1)
Basophils Relative: 1 %
Eosinophils Absolute: 0.5 10*3/uL (ref 0.0–0.5)
Eosinophils Relative: 3 %
HCT: 33.6 % — ABNORMAL LOW (ref 36.0–46.0)
Hemoglobin: 10.6 g/dL — ABNORMAL LOW (ref 12.0–15.0)
Immature Granulocytes: 1 %
Lymphocytes Relative: 20 %
Lymphs Abs: 2.9 10*3/uL (ref 0.7–4.0)
MCH: 25.2 pg — ABNORMAL LOW (ref 26.0–34.0)
MCHC: 31.5 g/dL (ref 30.0–36.0)
MCV: 79.8 fL — ABNORMAL LOW (ref 80.0–100.0)
Monocytes Absolute: 0.9 10*3/uL (ref 0.1–1.0)
Monocytes Relative: 6 %
Neutro Abs: 10.1 10*3/uL — ABNORMAL HIGH (ref 1.7–7.7)
Neutrophils Relative %: 69 %
Platelets: 507 10*3/uL — ABNORMAL HIGH (ref 150–400)
RBC: 4.21 MIL/uL (ref 3.87–5.11)
RDW: 16.9 % — ABNORMAL HIGH (ref 11.5–15.5)
WBC: 14.5 10*3/uL — ABNORMAL HIGH (ref 4.0–10.5)
nRBC: 0 % (ref 0.0–0.2)

## 2021-09-17 LAB — CBG MONITORING, ED: Glucose-Capillary: 300 mg/dL — ABNORMAL HIGH (ref 70–99)

## 2021-09-17 LAB — TROPONIN I (HIGH SENSITIVITY)
Troponin I (High Sensitivity): 21 ng/L — ABNORMAL HIGH (ref <18)
Troponin I (High Sensitivity): 8 ng/L (ref <18)

## 2021-09-17 LAB — MRSA NEXT GEN BY PCR, NASAL: MRSA by PCR Next Gen: NOT DETECTED

## 2021-09-17 LAB — C-REACTIVE PROTEIN: CRP: 9.6 mg/dL — ABNORMAL HIGH (ref <1.0)

## 2021-09-17 LAB — FIBRINOGEN
Fibrinogen: 742 mg/dL — ABNORMAL HIGH (ref 210–475)
Fibrinogen: 600 mg/dL — ABNORMAL HIGH (ref 210–475)

## 2021-09-17 LAB — I-STAT BETA HCG BLOOD, ED (MC, WL, AP ONLY): I-stat hCG, quantitative: <5 m[IU]/mL (ref <5)

## 2021-09-17 LAB — D-DIMER, QUANTITATIVE: D-Dimer, Quant: 1.14 ug/mL-FEU — ABNORMAL HIGH (ref 0.00–0.50)

## 2021-09-17 LAB — RESP PANEL BY RT-PCR (FLU A&B, COVID) ARPGX2
Influenza A by PCR: NEGATIVE
Influenza B by PCR: NEGATIVE
SARS Coronavirus 2 by RT PCR: POSITIVE — AB

## 2021-09-17 LAB — PROTIME-INR
INR: 1.8 — ABNORMAL HIGH (ref 0.8–1.2)
Prothrombin Time: 21 seconds — ABNORMAL HIGH (ref 11.4–15.2)

## 2021-09-17 LAB — APTT: aPTT: 55 seconds — ABNORMAL HIGH (ref 24–36)

## 2021-09-17 SURGERY — PERIPHERAL VASCULAR THROMBECTOMY
Anesthesia: LOCAL | Laterality: Left

## 2021-09-17 MED ORDER — HEPARIN (PORCINE) 25000 UT/250ML-% IV SOLN
1250.0000 [IU]/h | INTRAVENOUS | Status: DC
  Administered 2021-09-17: 15:00:00 800 [IU]/h via INTRAVENOUS
  Administered 2021-09-18: 1250 [IU]/h via INTRAVENOUS
  Filled 2021-09-17: qty 250

## 2021-09-17 MED ORDER — ACETAMINOPHEN 325 MG RE SUPP
325.0000 mg | RECTAL | Status: DC | PRN
  Filled 2021-09-17: qty 2

## 2021-09-17 MED ORDER — SODIUM CHLORIDE 0.9 % IV SOLN: 250.0000 mL | INTRAVENOUS | Status: DC | PRN

## 2021-09-17 MED ORDER — MORPHINE SULFATE (PF) 2 MG/ML IV SOLN
2.0000 mg | INTRAVENOUS | Status: DC | PRN
  Administered 2021-09-17 – 2021-09-24 (×22): 2 mg via INTRAVENOUS
  Filled 2021-09-17 (×22): qty 1

## 2021-09-17 MED ORDER — POLYETHYLENE GLYCOL 3350 17 G PO PACK: 17.0000 g | PACK | Freq: Every day | ORAL | Status: DC | PRN

## 2021-09-17 MED ORDER — HEPARIN (PORCINE) IN NACL 1000-0.9 UT/500ML-% IV SOLN
INTRAVENOUS | Status: DC | PRN
  Administered 2021-09-17: 500 mL

## 2021-09-17 MED ORDER — OXYCODONE-ACETAMINOPHEN 5-325 MG PO TABS
1.0000 | ORAL_TABLET | Freq: Once | ORAL | Status: AC
  Administered 2021-09-17: 1 via ORAL
  Filled 2021-09-17: qty 1

## 2021-09-17 MED ORDER — SODIUM CHLORIDE 0.9 % IV SOLN: INTRAVENOUS | Status: DC

## 2021-09-17 MED ORDER — PHENOL 1.4 % MT LIQD
1.0000 | OROMUCOSAL | Status: DC | PRN
  Administered 2021-09-19: 1 via OROMUCOSAL
  Filled 2021-09-17 (×2): qty 177

## 2021-09-17 MED ORDER — LORAZEPAM 1 MG PO TABS: 1.0000 mg | ORAL_TABLET | Freq: Once | ORAL | Status: DC

## 2021-09-17 MED ORDER — LIDOCAINE HCL (PF) 1 % IJ SOLN
INTRAMUSCULAR | Status: DC | PRN
  Administered 2021-09-17: 10 mL via INTRADERMAL

## 2021-09-17 MED ORDER — METOPROLOL TARTRATE 5 MG/5ML IV SOLN
2.0000 mg | INTRAVENOUS | Status: DC | PRN
Start: 2021-09-17 — End: 2021-09-18

## 2021-09-17 MED ORDER — CLONAZEPAM 1 MG PO TABS
1.0000 mg | ORAL_TABLET | Freq: Three times a day (TID) | ORAL | Status: DC | PRN
  Administered 2021-09-18 – 2021-09-26 (×21): 1 mg via ORAL
  Filled 2021-09-17 (×23): qty 1

## 2021-09-17 MED ORDER — PANTOPRAZOLE SODIUM 40 MG PO TBEC
40.0000 mg | DELAYED_RELEASE_TABLET | Freq: Every day | ORAL | Status: DC
  Administered 2021-09-18 – 2021-09-26 (×9): 40 mg via ORAL
  Filled 2021-09-17 (×10): qty 1

## 2021-09-17 MED ORDER — HEPARIN SODIUM (PORCINE) 1000 UNIT/ML IJ SOLN
INTRAMUSCULAR | Status: DC | PRN
  Administered 2021-09-17: 5000 [IU] via INTRAVENOUS

## 2021-09-17 MED ORDER — SODIUM CHLORIDE 0.9 % IV SOLN
1.0000 mg/h | INTRAVENOUS | Status: DC
  Administered 2021-09-17 – 2021-09-18 (×3): 1 mg/h
  Filled 2021-09-17 (×7): qty 10

## 2021-09-17 MED ORDER — LABETALOL HCL 5 MG/ML IV SOLN: 10.0000 mg | INTRAVENOUS | Status: DC | PRN

## 2021-09-17 MED ORDER — ONDANSETRON HCL 4 MG/2ML IJ SOLN
4.0000 mg | Freq: Four times a day (QID) | INTRAMUSCULAR | Status: DC | PRN
  Administered 2021-09-17 – 2021-09-25 (×11): 4 mg via INTRAVENOUS
  Filled 2021-09-17 (×11): qty 2

## 2021-09-17 MED ORDER — HEPARIN (PORCINE) IN NACL 1000-0.9 UT/500ML-% IV SOLN
INTRAVENOUS | Status: AC
  Filled 2021-09-17: qty 500

## 2021-09-17 MED ORDER — ACETAMINOPHEN 325 MG PO TABS: 325.0000 mg | ORAL_TABLET | ORAL | Status: DC | PRN

## 2021-09-17 MED ORDER — ONDANSETRON 4 MG PO TBDP
4.0000 mg | ORAL_TABLET | Freq: Once | ORAL | Status: AC
  Administered 2021-09-17: 4 mg via ORAL
  Filled 2021-09-17: qty 1

## 2021-09-17 MED ORDER — SODIUM CHLORIDE 0.9% FLUSH
3.0000 mL | Freq: Two times a day (BID) | INTRAVENOUS | Status: DC
  Administered 2021-09-17 – 2021-09-26 (×11): 3 mL via INTRAVENOUS

## 2021-09-17 MED ORDER — SODIUM CHLORIDE 0.9 % IV BOLUS
1000.0000 mL | Freq: Once | INTRAVENOUS | Status: AC
  Administered 2021-09-17: 1000 mL via INTRAVENOUS

## 2021-09-17 MED ORDER — INSULIN ASPART 100 UNIT/ML IJ SOLN: 6.0000 [IU] | Freq: Once | INTRAMUSCULAR | Status: DC

## 2021-09-17 MED ORDER — MIDAZOLAM HCL 2 MG/2ML IJ SOLN: 1.0000 mg | INTRAMUSCULAR | Status: DC | PRN

## 2021-09-17 MED ORDER — HEPARIN (PORCINE) 25000 UT/250ML-% IV SOLN
1250.0000 [IU]/h | INTRAVENOUS | Status: DC
  Administered 2021-09-17: 1250 [IU]/h via INTRAVENOUS
  Filled 2021-09-17: qty 250

## 2021-09-17 MED ORDER — OXYCODONE-ACETAMINOPHEN 5-325 MG PO TABS
1.0000 | ORAL_TABLET | ORAL | Status: DC | PRN
  Administered 2021-09-17: 17:00:00 1 via ORAL
  Administered 2021-09-17 – 2021-09-23 (×24): 2 via ORAL
  Administered 2021-09-23: 01:00:00 1 via ORAL
  Administered 2021-09-23 – 2021-09-26 (×12): 2 via ORAL
  Filled 2021-09-17 (×18): qty 2
  Filled 2021-09-17: qty 1
  Filled 2021-09-17 (×6): qty 2
  Filled 2021-09-17: qty 1
  Filled 2021-09-17 (×14): qty 2

## 2021-09-17 MED ORDER — BISACODYL 10 MG RE SUPP: 10.0000 mg | Freq: Every day | RECTAL | Status: DC | PRN

## 2021-09-17 MED ORDER — LIDOCAINE HCL (PF) 1 % IJ SOLN
INTRAMUSCULAR | Status: AC
  Filled 2021-09-17: qty 30

## 2021-09-17 MED ORDER — INSULIN ASPART 100 UNIT/ML IJ SOLN
0.0000 [IU] | Freq: Three times a day (TID) | INTRAMUSCULAR | Status: DC
  Administered 2021-09-17 – 2021-09-18 (×3): 7 [IU] via SUBCUTANEOUS
  Administered 2021-09-18 – 2021-09-19 (×2): 4 [IU] via SUBCUTANEOUS
  Administered 2021-09-19: 11 [IU] via SUBCUTANEOUS
  Administered 2021-09-19: 4 [IU] via SUBCUTANEOUS
  Administered 2021-09-22: 07:00:00 3 [IU] via SUBCUTANEOUS
  Administered 2021-09-23: 09:00:00 7 [IU] via SUBCUTANEOUS
  Administered 2021-09-24 – 2021-09-25 (×2): 4 [IU] via SUBCUTANEOUS
  Administered 2021-09-25 – 2021-09-26 (×2): 3 [IU] via SUBCUTANEOUS
  Administered 2021-09-26: 06:00:00 4 [IU] via SUBCUTANEOUS

## 2021-09-17 MED ORDER — SODIUM CHLORIDE 0.9% FLUSH: 3.0000 mL | INTRAVENOUS | Status: DC | PRN

## 2021-09-17 MED ORDER — IODIXANOL 320 MG/ML IV SOLN
INTRAVENOUS | Status: DC | PRN
  Administered 2021-09-17: 75 mL via INTRA_ARTERIAL

## 2021-09-17 MED ORDER — INSULIN ASPART 100 UNIT/ML IJ SOLN: 0.0000 [IU] | Freq: Three times a day (TID) | INTRAMUSCULAR | Status: DC

## 2021-09-17 MED ORDER — INSULIN ASPART 100 UNIT/ML IJ SOLN
0.0000 [IU] | Freq: Every day | INTRAMUSCULAR | Status: DC
  Administered 2021-09-17 – 2021-09-18 (×2): 5 [IU] via SUBCUTANEOUS

## 2021-09-17 MED ORDER — GUAIFENESIN-DM 100-10 MG/5ML PO SYRP: 15.0000 mL | ORAL_SOLUTION | ORAL | Status: DC | PRN

## 2021-09-17 MED ORDER — HYDRALAZINE HCL 20 MG/ML IJ SOLN: 5.0000 mg | INTRAMUSCULAR | Status: DC | PRN

## 2021-09-17 MED ORDER — CHLORHEXIDINE GLUCONATE CLOTH 2 % EX PADS
6.0000 | MEDICATED_PAD | Freq: Every day | CUTANEOUS | Status: DC
  Administered 2021-09-17 – 2021-09-26 (×10): 6 via TOPICAL

## 2021-09-17 SURGICAL SUPPLY — 18 items
CATH INFUS 135CMX50CM (CATHETERS) ×2 IMPLANT
CATH NAVICROSS ANG 65CM (CATHETERS) IMPLANT
CATH OMNI FLUSH 5F 65CM (CATHETERS) ×2 IMPLANT
CATH TEMPO AQUA 5F 100CM (CATHETERS) ×2 IMPLANT
CATHETER NAVICROSS ANG 65CM (CATHETERS) ×3
GLIDEWIRE ADV .035X260CM (WIRE) ×2 IMPLANT
KIT MICROPUNCTURE NIT STIFF (SHEATH) ×2 IMPLANT
KIT PV (KITS) ×2 IMPLANT
MAT PREVALON FULL STRYKER (MISCELLANEOUS) ×2 IMPLANT
SHEATH BRITE TIP 6FR 35CM (SHEATH) ×2 IMPLANT
SHEATH FLEX ANSEL ANG 6F 45CM (SHEATH) ×2 IMPLANT
SHEATH PINNACLE 5F 10CM (SHEATH) ×2 IMPLANT
SHEATH PROBE COVER 6X72 (BAG) ×2 IMPLANT
STOPCOCK MORSE 400PSI 3WAY (MISCELLANEOUS) ×2 IMPLANT
SYR MEDRAD MARK V 150ML (SYRINGE) ×2 IMPLANT
TRANSDUCER W/STOPCOCK (MISCELLANEOUS) ×2 IMPLANT
TRAY PV CATH (CUSTOM PROCEDURE TRAY) ×2 IMPLANT
WIRE BENTSON .035X145CM (WIRE) ×2 IMPLANT

## 2021-09-17 NOTE — ED Notes (Signed)
[facility] paged to [place] per his request

## 2021-09-17 NOTE — Consult Note (Signed)
[facility] Consultation  Terri Sparks IDP:[id number] DOB: November 28, 1975 DOA: 09/17/2021 PCP: Ladell Pier, MD   Requesting physician: Dr. Carlis Abbott Date of consultation: 09/17/2021 Reason for consultation: COVID test positive  Impression/Recommendations Principal Problem:   Critical lower limb ischemia ([place])    COVID infection, her symptoms dominantly located on upper respiratory with no systemic symptoms, such as fever and appears that her respiratory symptoms started at least 7 days ago and stable.  Lung exam benign, and she is not hypoxic, chest xray not showing any acute infiltrates.  She is out of the window for COVID antiviral treatment at this point. Will order COVID labs and monitor her clinically but no specific treatment indicated at this point. Acute left lower extremity ischemia, status post arteriogram and thrombolysis of left leg. On heparin drip.  Noticed that her INR today and most recent reading couple months ago were both subtherapeutic, patient reported she has been taking 5 mg and 10 mg alternatively every day and last INR check was several months ago at least.  Although COVID infection can potentially be contributing factor to today's thrombosis, question is whether her INR has been consistently therapeutic recently.  Recommend her follow-up with her PCP for more frequent INR checks in the next few weeks.  Patient expressed understanding and agreed. Quit smoking. CKD stage II, euvolemic, received IV contrast today volume status. Agreed with IVF for today. IDDM, hyperglycemia, agreed with current insulin regimen. HTN, continue amlodipine Cigarette smoking, cessation consultation at bedside. Anxiety depression, continue SSRI. HLD, continue statin Chronic iron deficiency anemia, H&H stable, continue iron supplement. Asthma/COPD, no symptoms or signs of acute exacerbation, continue as needed breathing meds. Morbid obesity, calorie control recommended.  I  will followup again tomorrow. Please contact me if I can be of assistance in the meanwhile. Thank you for this consultation.  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  Patient started to have sore throat, dry cough and generalized weakness and malaise about 7 days ago, denies any fever chills no shortness of breath or chest pains.  3 days ago, she started to notice left foot pain and discoloration gradually getting worse, pain became constant overnight, 10/10 dull and aching like, which prompted her to come to the hospital. She also felt nausea this morning and vomited x1 of stomach content.  ED work-up showed diminished left pedal pulses, Doppler check negative for left polyposis, vascular surgery called for emergency arteriogram and thrombolysis.  Patient told me that she takes Coumadin 5 mg and 10 mg alternatively, she does not check INR regularly, last INR check was at least several months ago.  Review of Systems: 14 point review system done negative except those mentioned in HPI   Past Medical History:  Diagnosis Date   Anxiety    Depression    Diabetes ([place])    Type 2   GERD (gastroesophageal reflux disease)    History of blood clots    ,DVT-left leg, and early 2000, had blood clot behind left breast   History of kidney stones    Hypercalcemia    Hyperparathyroidism    Hypertension    had been on Lisinopril and PCP took her off it 4 months ago   Iron deficiency anemia  Left thyroid nodule    Mass of right ovary    Nephrolithiasis    PAOD (peripheral arterial occlusive disease) (HCC)    Peripheral vascular disease (HCC)    Prolonged Q-T interval on ECG 04/19/2019    Renal calculi    Substance abuse ([place])    Tooth loose    top tooth from the right  is loose    Past Surgical History:  Procedure Laterality Date   ABDOMINAL AORTOGRAM W/LOWER EXTREMITY N/A 11/14/2017   Procedure: ABDOMINAL AORTOGRAM W/LOWER EXTREMITY;  Surgeon: Waynetta Sandy, MD;  Location: [facility];  Service: Cardiovascular;  Laterality: N/A;   ANGIOPLASTY ILLIAC ARTERY Left 02/25/2018   Procedure: BALLOON ANGIOPLASTY LEFT ILIAC ARTERY;  Surgeon: Conrad Rougemont, MD;  Location: [facility];  Service: Vascular;  Laterality: Left;   APPLICATION OF WOUND VAC Left 03/03/2018   Procedure: APPLICATION OF WOUND VAC;  Surgeon: Angelia Mould, MD;  [facility];  Service: Vascular;  Laterality: Left;   CYSTOSCOPY W/ URETERAL STENT PLACEMENT Right 06/27/2018   Procedure: CYSTOSCOPY WITH RETROGRADE PYELOGRAM/URETERAL DOUBLE J STENT PLACEMENT;  Surgeon: Ceasar Mons, MD;  Location: [facility];  Service: Urology;  Laterality: Right;   CYSTOSCOPY WITH RETROGRADE PYELOGRAM, URETEROSCOPY AND STENT PLACEMENT Right 04/03/2020   Procedure: CYSTOSCOPY WITH RIGHT  RETROGRADE PYELOGRAM, URETEROSCOPY WITH HOLMIUM LASER AND STENT EXCHANGE PLACEMENT;  Surgeon: Ardis Hughs, MD;  Location: [facility];  Service: Urology;  Laterality: Right;   CYSTOSCOPY WITH STENT PLACEMENT N/A 02/23/2019   Procedure: CYSTOSCOPY WITH RIGHT URETERAL STENT EXCHANGE/ LEFT URETERAL STENT PLACEMENT;  Surgeon: Ceasar Mons, MD;  Location: [facility];  Service: Urology;  Laterality: N/A;   CYSTOSCOPY WITH STENT PLACEMENT Right 06/11/2019   Procedure: CYSTOSCOPY,RIGHT RETROGRADE WITH STENT PLACEMENT;  Surgeon: Irine Seal, MD;  Location: [facility];  Service: Urology;  Laterality: Right;   CYSTOSCOPY WITH STENT PLACEMENT Right 03/03/2020   Procedure: CYSTOSCOPY WITH STENT PLACEMENT retroagrade pylerogram;  Surgeon: Ardis Hughs, MD;  Location: [facility];  Service: Urology;  Laterality: Right;    CYSTOSCOPY/RETROGRADE/URETEROSCOPY/STONE EXTRACTION WITH BASKET Bilateral 05/01/2019   Procedure: CYSTOSCOPY/URETEROSCOPY/STONE EXTRACTION / LASER LITHOTRIPSY, BILATERAL URETEROSCOPY WITH URETERAL STONE EXTRACTION AND STENT EXCHANGE;  Surgeon: Ceasar Mons, MD;  Location[facility];  Service: Urology;  Laterality: Bilateral;   CYSTOSCOPY/URETEROSCOPY/HOLMIUM LASER/STENT PLACEMENT Right 06/27/2019   Procedure: CYSTOSCOPY/URETEROSCOPY/HOLMIUM LASER/STENT EXCHANGE;  Surgeon: Ceasar Mons, MD;  Location: [facility];  Service: Urology;  Laterality: Right;   EMBOLECTOMY Left 02/24/2017   Procedure: Left Lower Extremity Embolectomy and Angiogram.;  Surgeon: Waynetta Sandy, MD;  Location: [facility];  Service: Vascular;  Laterality: Left;   INSERTION OF ILIAC STENT  02/24/2017   Procedure: INSERTION OF Common ILIAC STENT;  Surgeon: Waynetta Sandy, MD;  Location: [facility];  Service: Vascular;;   INTRAOPERATIVE ARTERIOGRAM Left 02/25/2018   Procedure: INTRA OPERATIVE ARTERIOGRAM WITH LEFT LEG RUNOFF;  Surgeon: Conrad Saxon, MD;  Location: [facility];  Service: Vascular;  Laterality: Left;   LOWER EXTREMITY ANGIOGRAM Left 02/24/2017   Procedure: Aortagram, Left lower extremity Run-off;  Surgeon: Waynetta Sandy, MD;  Location: [facility];  Service: Vascular;  Laterality: Left;   LOWER EXTREMITY ANGIOGRAM Left 12/28/2020   Procedure: AORTOGRAM, LEFT LOWER EXTREMITY ANGIOGRAM, THROMBOLYSIS VIA LEFT BRACHIAL ARTERY APPROACH;  Surgeon: Cherre Robins, MD;  Location: [facility];  Service: Vascular;  Laterality: Left;   LOWER EXTREMITY ANGIOGRAPHY N/A 12/29/2020   Procedure: LYSIS RECHECK;  Surgeon: Donzetta Matters,  Georgia Dom, MD;  Location: [facility];  Service: Cardiovascular;  Laterality: N/A;   PATCH ANGIOPLASTY Left 02/25/2018   Procedure: PATCH ANGIOPLASTY LEFT SUPERFICIAL FEMORAL ARTERY WITH BOVINE PATCH;  Surgeon: Conrad Whiting, MD;  Location: [facility];  Service:  Vascular;  Laterality: Left;   PERCUTANEOUS VENOUS THROMBECTOMY,LYSIS WITH INTRAVASCULAR ULTRASOUND (IVUS) Left 05/12/2018   Procedure: MECHANICAL THROMBECTOMY LEFT LEG, BALLOON ANGIOPLASTY LEFT ANTERIOR TIBIAL ARTERY, AORTOGRAM WITH LEFT LEG RUNOFF;  Surgeon: Serafina Mitchell, MD;  Location: [facility];  Service: Vascular;  Laterality: Left;   PERIPHERAL VASCULAR THROMBECTOMY  12/29/2020   Procedure: PERIPHERAL VASCULAR THROMBECTOMY;  Surgeon: Waynetta Sandy, MD;  Location: [facility];  Service: Cardiovascular;;   removal of parathyroid adenoma  5/11   THROMBECTOMY FEMORAL ARTERY Left 02/25/2018   Procedure: LEFT ILIAC AND POPLITEAL ARTERY THROMBECTOMY;  Surgeon: Conrad St. Anthony, MD;  Location: [facility];  Service: Vascular;  Laterality: Left;   TUBAL LIGATION     WOUND DEBRIDEMENT Left 03/03/2018   Procedure: DEBRIDEMENT WOUND;  Surgeon: Angelia Mould, MD;  Location: [facility];  Service: Vascular;  Laterality: Left;   WOUND EXPLORATION Left 03/03/2018   Procedure: WOUND EXPLORATION;  Surgeon: Angelia Mould, MD;  Location: [facility];  Service: Vascular;  Laterality: Left;   Social History:  reports that she has been smoking cigarettes. She has a 7.50 pack-year smoking history. She has never used smokeless tobacco. She reports current alcohol use. She reports current drug use. Drugs: Marijuana and Cocaine.  Allergies  Allergen Reactions   Ciprofloxacin Hcl Hives   Dilaudid [Hydromorphone Hcl] Shortness Of Breath and Other (See Comments)    "Asystole," per pt report   Macrobid [Nitrofurantoin Macrocrystal] Hives, Shortness Of Breath and Rash   Other Hives, Shortness Of Breath and Rash    NO "-CILLINS"!!!   Penicillins Shortness Of Breath    Had had cephalosporins without incident Has patient had a PCN reaction causing immediate rash, facial/tongue/throat swelling, SOB or lightheadedness with hypotension: Yes Has patient had a PCN reaction causing severe rash involving mucus  membranes or skin necrosis: Unk Has patient had a PCN reaction that required hospitalization: Unk Has patient had a PCN reaction occurring within the last 10 years: No If all of the above answers are "NO", then may proceed with Cephalosporin use.    Sulfa Antibiotics Hives, Shortness Of Breath and Rash   Lexapro [Escitalopram Oxalate] Other (See Comments)    "I just did not like it."   Family History  Problem Relation Age of Onset   Depression Mother    Diabetes Father    Heart failure Father     Prior to Admission medications   Medication Sig Start Date End Date Taking? Authorizing Provider  Accu-Chek Softclix Lancets lancets Use as instructed 07/21/21   Ladell Pier, MD  acetaminophen (TYLENOL) 500 MG tablet Take 2,000 mg by mouth daily as needed (for headaches).     [provider]  amLODipine (NORVASC) 5 MG tablet Take 1 tablet (5 mg total) by mouth daily. 01/08/21   Elsie Stain, MD  aspirin 81 MG EC tablet TAKE 1 TABLET (81 MG TOTAL) BY MOUTH DAILY. SWALLOW WHOLE. Patient taking differently: Take by mouth 5 (five) times daily. SWALLOW WHOLE. 01/02/21 01/02/22  Cherre Robins, MD  atorvastatin (LIPITOR) 40 MG tablet TAKE 1 TABLET (40 MG TOTAL) BY MOUTH DAILY. 07/22/21 07/22/22  Ladell Pier, MD  Blood Glucose Monitoring Suppl (ACCU-CHEK GUIDE ME) w/Device  KIT USE AS INSTRUCTED TO CHECK BLOOD SUGAR THREE TIMES DAILY Patient not taking: Reported on 02/17/2021 01/13/21   Ladell Pier, MD  buPROPion [facility] SR) 150 MG 12 hr tablet TAKE 1 TABLET (150 MG TOTAL) BY MOUTH 2 (TWO) TIMES DAILY. 08/20/21   Nwoko, Terese Door, PA  carvedilol (COREG) 3.125 MG tablet TAKE ONE TABLET BY MOUTH TWICE DAILY 06/10/21   Ladell Pier, MD  Cholecalciferol (VITAMIN D-3 PO) Take 1 capsule by mouth daily.    [provider]  clonazePAM (KLONOPIN) 1 MG tablet Take 1 tablet (1 mg total) by mouth 3 (three) times daily as needed for anxiety. 08/20/21   Nwoko, Terese Door,  PA  Continuous Blood Gluc Receiver (FREESTYLE LIBRE 2 READER) DEVI Check blood sugar TID. 07/23/21   Ladell Pier, MD  Continuous Blood Gluc Sensor (FREESTYLE LIBRE 2 SENSOR) MISC Check blood sugar TID. 07/23/21   Ladell Pier, MD  Continuous Blood Gluc Sensor (FREESTYLE LIBRE SENSOR SYSTEM) MISC Change sensor Q 2 wks 07/21/21   Ladell Pier, MD  ferrous sulfate 325 (65 FE) MG tablet Take 1 tablet (325 mg total) by mouth daily with breakfast. Patient not taking: Reported on 02/17/2021 03/04/20   Nita Sells, MD  fexofenadine (ALLEGRA) 60 MG tablet Take 1 tablet (60 mg total) by mouth 2 (two) times daily. 02/17/21   Ladell Pier, MD  folic acid (FOLVITE) 1 MG tablet Take 1 tablet (1 mg total) by mouth daily. 02/17/21   Ladell Pier, MD  gabapentin (NEURONTIN) 400 MG capsule TAKE 1 CAPSULE (400 MG TOTAL) BY MOUTH 3 (THREE) TIMES DAILY. 08/31/21   Ladell Pier, MD  glucose blood (ACCU-CHEK GUIDE) test strip Use as instructed 07/21/21   Ladell Pier, MD  hydrOXYzine (ATARAX) 50 MG tablet TAKE 1 TABLET (50 MG TOTAL) BY MOUTH 3 (THREE) TIMES DAILY AS NEEDED. 09/11/21   Nwoko, Isidoro Donning E, PA  insulin glargine (LANTUS SOLOSTAR) 100 UNIT/ML Solostar Pen Inject 65 Units into the skin daily. 07/21/21   Ladell Pier, MD  Insulin Pen Needle 32G X 4 MM MISC USE AS DIRECTED Patient not taking: Reported on 02/17/2021 01/02/21 01/02/22  Cherre Robins, MD  Insulin Syringes, Disposable, U-100 1 ML MISC Use as directed Patient not taking: Reported on 02/17/2021 09/24/19   Darliss Cheney, MD  Multiple Vitamins-Minerals (ONE-A-DAY WOMENS PO) Take 1 tablet by mouth daily.    [provider]  nicotine (NICODERM CQ - DOSED IN MG/24 HOURS) 21 mg/24hr patch PLACE 1 PATCH (21 MG TOTAL) ONTO THE SKIN DAILY. 09/15/21 09/15/22  Ladell Pier, MD  nitroGLYCERIN (NITROSTAT) 0.4 MG SL tablet PLACE 1 TABLET (0.4 MG TOTAL) UNDER THE TONGUE EVERY 5 (FIVE) MINUTES AS NEEDED  FOR CHEST PAIN. 05/22/20 05/22/21  Elouise Munroe, MD  NOVOLOG FLEXPEN 100 UNIT/ML FlexPen Use as directed per sliding scale, Pt is instructed about direction, Maximum 45 units per day  CBG 70 - 120: 0 units CBG 121 - 150: 2 units CBG 151 - 200: 3 units CBG 201 - 250: 5 units CBG 251 - 300: 8 units CBG 301 - 350: 11 units CBG 351 - 400: 15 units CBG > 400: Call Primary provider 08/04/21   Ladell Pier, MD  olopatadine (PATANOL) 0.1 % ophthalmic solution Place 1 drop into both eyes 2 (two) times daily. 02/17/21   Ladell Pier, MD  ondansetron (ZOFRAN) 4 MG tablet Take 1 tablet (4 mg total) by mouth every 8 (  eight) hours as needed for nausea or vomiting. 01/13/21   Cherre Robins, MD  sertraline (ZOLOFT) 100 MG tablet TAKE 1 AND 1/2 TABLET (150 MG) BY MOUTH DAILY. 09/11/21   Nwoko, Terese Door, PA  sucralfate (CARAFATE) 1 GM/10ML suspension TAKE 10 MLS (1 G TOTAL) BY MOUTH 2 (TWO) TIMES DAILY WITH BREAKFAST AND LUNCH. 04/07/21   Ladell Pier, MD  traZODone (DESYREL) 100 MG tablet TAKE 1 TABLET (100 MG TOTAL) BY MOUTH AT BEDTIME. 09/11/21   Nwoko, Terese Door, PA  TRUEPLUS INSULIN SYRINGE 31G X 5/16" 1 ML MISC USE AS DIRECTED 05/21/20   Ladell Pier, MD  VENTOLIN HFA 108 (90 Base) MCG/ACT inhaler INHALE TWO PUFFS BY MOUTH EVERY SIX HOURS AS NEEDED FOR WHEEZING OR SHORTNESS OF BREATH 08/31/21   Ladell Pier, MD  vitamin B-12 (CYANOCOBALAMIN) 1000 MCG tablet Take 2 tablets (2,000 mcg total) by mouth daily. 02/17/21   Ladell Pier, MD  warfarin (COUMADIN) 5 MG tablet TAKE ONE TABLET ( 5 MG) BY MOUTH AT NOON ON SUN/TUES/THURS/SAT AND 2 TABLETS (10 MG) ON MON/WED/FRI 08/20/21   Ladell Pier, MD   Physical Exam: Blood pressure 118/61, pulse 90, temperature 99 F (37.2 C), temperature source Oral, resp. rate 17, height 5' 7" (1.702 m), weight 114.3 kg, SpO2 100 %. Vitals:   09/17/21 0935 09/17/21 1600  BP: 118/61   Pulse: 99 90  Resp: 16 17  Temp: 99 F (37.2 C)   SpO2:  99% 100%    General: Lethargic Eyes: PERRL ENT: Mucous membranes moist Neck: Supple extremity Cardiovascular: RRR, no murmurs, decreased pedal pulses on left side Respiratory: Clear breathing bilaterally, no crackles no wheezing Abdomen: Soft nontender nondistended Skin: Purple discoloration of left foot Musculoskeletal: No tenderness, normal ROM Psychiatric: Calm Neurologic: No focal deficit  Labs on Admission:  Basic Metabolic Panel: Recent Labs  Lab 09/17/21 0406  NA 130*  K 4.1  CL 98  CO2 25  GLUCOSE 396*  BUN 13  CREATININE 1.67*  CALCIUM 9.3   Liver Function Tests: Recent Labs  Lab 09/17/21 0406  AST 12*  ALT 13  ALKPHOS 93  BILITOT 0.2*  PROT 7.3  ALBUMIN 2.9*   No results for input(s): LIPASE, AMYLASE in the last 168 hours. No results for input(s): AMMONIA in the last 168 hours. CBC: Recent Labs  Lab 09/17/21 0406  WBC 14.5*  NEUTROABS 10.1*  HGB 10.6*  HCT 33.6*  MCV 79.8*  PLT 507*   Cardiac Enzymes: No results for input(s): CKTOTAL, CKMB, CKMBINDEX, TROPONINI in the last 168 hours. BNP: Invalid input(s): POCBNP CBG: Recent Labs  Lab 09/17/21 1025  GLUCAP 300*    Radiological Exams on Admission: DG Chest 2 View  Result Date: 09/17/2021 CLINICAL DATA:  Cough EXAM: CHEST - 2 VIEW COMPARISON:  12/28/2020 FINDINGS: Normal heart size and mediastinal contours. No acute infiltrate or edema. No effusion or pneumothorax. No acute osseous findings. IMPRESSION: Negative chest. Electronically Signed   By: Jorje Guild M.D.   On: 09/17/2021 04:40   PERIPHERAL VASCULAR CATHETERIZATION  Result Date: 09/17/2021 Patient name: Terri Sparks         MRN: [id number]        DOB: Feb 19, 1976          Sex: female  09/17/2021 Pre-operative Diagnosis: Acute ischemia left lower extremity Post-operative diagnosis:  Same Surgeon:  Marty Heck, MD Procedure Performed: 1.  Ultrasound-guided access right common femoral artery 2.  Aortogram with catheter  selection of aorta 3.  Left lower extremity arteriogram with selection of third order branches 4.  Initiation of left lower extremity thrombolysis  Indications: Patient is a 45 year old female who presented to the ED with 2 to 3-day history of left lower extremity pain.  She was found to have no femoral pulse and no signals in the foot.  She is motor intact.  She has had previous common iliac stent with previous left lower extremity thrombectomy by Dr. Donzetta Matters and multiple additional thrombolysis events for limb salvage of the left lower extremity.  She is taking Coumadin although subtherapeutic.  She presents after risk benefits discussed.  Findings:  Aortogram showed that the left common and external iliac artery are occluded.  She does reconstitute some of the common femoral but this appears to be full of acute thrombus.  The SFA and above-knee popliteal artery are patent.  There is some thrombus in the proximal profunda.  The below-knee popliteal artery is occluded.  She reconstitutes a small island of peroneal.  Thrombolysis catheter was placed from the proximal left common iliac into the distal SFA above-knee popliteal artery for initiation of thrombolysis.             Procedure:  The patient was identified in the holding area and taken to room 8.  The patient was then placed supine on the table and prepped and draped in the usual sterile fashion.  A time out was called.  Ultrasound was used to evaluate the right common femoral artery.  It was patent .  A digital ultrasound image was acquired.  A micropuncture needle was used to access the right common femoral artery under ultrasound guidance.  An 018 wire was advanced without resistance and a micropuncture sheath was placed.  The 018 wire was removed and a benson wire was placed.  The micropuncture sheath was exchanged for a 5 french sheath.  An omniflush catheter was advanced over the wire to the level of L-1.  An abdominal angiogram was obtained.  Pertinent  findings are noted above but the left common and external iliac artery were occluded.  I then used a Glidewire advantage to cross the occluded segment of left iliac artery and got my wire into the left SFA and then got some staged images of the left lower extremity with pertinent findings noted above.  Ultimately I placed a 6 French Ansell sheath in the right groin up to the aortic bifurcation.  I then advanced the UniFuse catheter from the takeoff of the left iliac artery all the way down to the distal SFA above-knee popliteal artery over my glidewire advantage.  This was the longest infusion catheter we had.  We will initiate tPA at 1 mg an hour down the unifuse catheter and heparin at 800 units an hour through the sheath.  She tolerated the procedure without any complications.   Plan: Patient will be taken to the ICU for initiation of thrombolysis.  She will return tomorrow for thrombolytics catheter check.  Marty Heck, MD [facility] Office: [phone]     EKG: Independently reviewed.  Sinus, no acute ST changes Time spent: 40 min  Lequita Halt [facility] Pager [phone] 09/17/2021, 4:37 PM

## 2021-09-17 NOTE — Progress Notes (Signed)
[place] for heparin Indication: LLE ischemia  Allergies  Allergen Reactions   Ciprofloxacin Hcl Hives   Dilaudid [Hydromorphone Hcl] Shortness Of Breath and Other (See Comments)    "Asystole," per pt report   Macrobid [Nitrofurantoin Macrocrystal] Hives, Shortness Of Breath and Rash   Other Hives, Shortness Of Breath and Rash    NO "-CILLINS"!!!   Penicillins Shortness Of Breath    Had had cephalosporins without incident Has patient had a PCN reaction causing immediate rash, facial/tongue/throat swelling, SOB or lightheadedness with hypotension: Yes Has patient had a PCN reaction causing severe rash involving mucus membranes or skin necrosis: Unk Has patient had a PCN reaction that required hospitalization: Unk Has patient had a PCN reaction occurring within the last 10 years: No If all of the above answers are "NO", then may proceed with Cephalosporin use.    Sulfa Antibiotics Hives, Shortness Of Breath and Rash   Lexapro [Escitalopram Oxalate] Other (See Comments)    "I just did not like it."    Patient Measurements: Height: 5\' 7"  (170.2 cm) Weight: 114.3 kg (252 lb) IBW/kg (Calculated) : 61.6 Heparin Dosing Weight: 88.2 kg  Vital Signs: Temp: 97.9 F (36.6 C) (12/15 2000) Temp Source: Oral (12/15 2000) BP: 164/81 (12/15 2300) Pulse Rate: 88 (12/15 2300)  Labs: Recent Labs    09/17/21 0406 09/17/21 0623 09/17/21 1638 09/17/21 2308  HGB 10.6*  --  10.5* 10.2*  HCT 33.6*  --  33.7* 33.4*  PLT 507*  --  486* 426*  APTT 55*  --   --   --   LABPROT 21.0*  --   --   --   INR 1.8*  --   --   --   HEPARINUNFRC  --   --   --  <0.10*  CREATININE 1.67*  --   --   --   TROPONINIHS 21* 8  --   --      Estimated Creatinine Clearance: 55 mL/min (A) (by C-G formula based on SCr of 1.67 mg/dL (H)).   Medical History: Past Medical History:  Diagnosis Date   Anxiety    Depression    Diabetes ([place])    Type 2   GERD  (gastroesophageal reflux disease)    History of blood clots    ,DVT-left leg, and early 2000, had blood clot behind left breast   History of kidney stones    Hypercalcemia    Hyperparathyroidism    Hypertension    had been on Lisinopril and PCP took her off it 4 months ago   Iron deficiency anemia    Left thyroid nodule    Mass of right ovary    Nephrolithiasis    PAOD (peripheral arterial occlusive disease) (HCC)    Peripheral vascular disease (HCC)    Prolonged Q-T interval on ECG 04/19/2019   Renal calculi    Substance abuse (HCC)    Tooth loose    top tooth from the right  is loose     Medications: see MAR  Assessment: 45 yo F with LLE ischemia - no pedal or posterior tibial pulse. Cap refill is > 3 secs. Pt on warfarin PTA for extensive vascular hx. Heparin was initiated at 1250 units/hr prior to procedure  Patient now s/p cath lab with Vascular with lytics started post-procedure, alteplase at 1mg /hr intra-catheter and heparin reduced to 800 units/hr post-procedure per Dr. Carlis Abbott. Plan overnight is to target heparin level 0.2-0.5 per Vascular and return tomorrow  for thrombolytics catheter check. -heparin level > 0.1 on 800 units/hr, fibrinogen= 600, hg= 10.2  F/u med rec for PTA warfarin regimen to resume afterwards.  Goal of Therapy: Heparin level 0.2-0.5 units/ml while patient on lytics Monitor platelets by anticoagulation protocol: Yes   Plan:  Increase heparin to 1050 units/hr Alteplase IK at 1mg /hr Check 6hr heparin level Plan return to cath labs 12/16  Hildred Laser, PharmD Clinical Pharmacist **Pharmacist phone directory can now be found on [email or web address] ([email or web address]).  Listed under [place].

## 2021-09-17 NOTE — Consult Note (Addendum)
Hospital Consult    Reason for Consult:  ischemic leg Requesting Physician:  Ova Freshwater [facility] MRN #:  [id number]  History of Present Illness: This is a 45 y.o. female who presented to the hospital with left leg pain.  She has an extensive vascular hx as below.  She states the pain has been ongoing for 2 days and is worse today.  She states that this is similar to previous ischemic events.  She states that about 2 months ago, they would not prescribe her coumadin and she was off of it for about a month.  She has been back on it for about a month.  Her INR today is 1.8.  She also states that she has been nauseated and not feeling well.  She denies any fevers.    Her creatinine today is 1.67.  She denies any hematochezia, melena, recent surgeries.   Vascular surgical hx: 02/24/2017 - left lower extremity angiogram, stenting left common iliac artery (8 x 59 mm iCast), open thromboembolectomy via femoral, popliteal and posterior tibial exposures by Dr. Donzetta Matters.   11/14/2017 - left lower extremity angiogram via right common femoral access Dr. Donzetta Matters   02/25/2018 - redo open iliofemoral and popliteal/tibial embolectomy via left femoral exposure by Dr. Bridgett Larsson   03/03/2018 - exploration and debridement of infected left groin wound by Dr. Scot Dock   05/12/2018 - mechanical thrombectomy of left common iliac, left femoral, left popliteal, left anterior tibial artery with penumbra device by Drs. Brabham and Gaylon Bentz  3/27-28/2022 arterial thrombolysis; mechanical thrombectomy of popliteal, tibioperoneal trunk, peroneal and anterior tibial arteries and cat 3 mechanical thrombectomy of anterior tibial and peroneal arteries  The pt is on a statin for cholesterol management.  The pt is on a daily aspirin.   Other AC:  coumadin The pt is on CCB, BB for hypertension.   The pt is diabetic.   Tobacco hx:  current  Past Medical History:  Diagnosis Date   Anxiety    Depression    Diabetes ([place])    Type 2   GERD  (gastroesophageal reflux disease)    History of blood clots    ,DVT-left leg, and early 2000, had blood clot behind left breast   History of kidney stones    Hypercalcemia    Hyperparathyroidism    Hypertension    had been on Lisinopril and PCP took her off it 4 months ago   Iron deficiency anemia    Left thyroid nodule    Mass of right ovary    Nephrolithiasis    PAOD (peripheral arterial occlusive disease) (HCC)    Peripheral vascular disease (HCC)    Prolonged Q-T interval on ECG 04/19/2019   Renal calculi    Substance abuse ([place])    Tooth loose    top tooth from the right  is loose     Past Surgical History:  Procedure Laterality Date   ABDOMINAL AORTOGRAM W/LOWER EXTREMITY N/A 11/14/2017   Procedure: ABDOMINAL AORTOGRAM W/LOWER EXTREMITY;  Surgeon: Waynetta Sandy, MD;  Location: [place] [facility];  Service: Cardiovascular;  Laterality: N/A;   ANGIOPLASTY ILLIAC ARTERY Left 02/25/2018   Procedure: BALLOON ANGIOPLASTY LEFT ILIAC ARTERY;  Surgeon: Conrad Alexander, MD;  Location: [place];  Service: Vascular;  Laterality: Left;   APPLICATION OF WOUND VAC Left 03/03/2018   Procedure: APPLICATION OF WOUND VAC;  Surgeon: Angelia Mould, MD;  Location: [place];  Service: Vascular;  Laterality: Left;   CYSTOSCOPY W/ URETERAL STENT PLACEMENT Right 06/27/2018  Procedure: CYSTOSCOPY WITH RETROGRADE PYELOGRAM/URETERAL DOUBLE J STENT PLACEMENT;  Surgeon: Ceasar Mons, MD;  Location: [place];  Service: Urology;  Laterality: Right;   CYSTOSCOPY WITH RETROGRADE PYELOGRAM, URETEROSCOPY AND STENT PLACEMENT Right 04/03/2020   Procedure: CYSTOSCOPY WITH RIGHT  RETROGRADE PYELOGRAM, URETEROSCOPY WITH HOLMIUM LASER AND STENT EXCHANGE PLACEMENT;  Surgeon: Ardis Hughs, MD;  Location: [facility];  Service: Urology;  Laterality: Right;   CYSTOSCOPY WITH STENT PLACEMENT N/A 02/23/2019   Procedure: CYSTOSCOPY WITH RIGHT URETERAL STENT EXCHANGE/ LEFT URETERAL STENT PLACEMENT;  Surgeon:  Ceasar Mons, MD;  Location: [facility];  Service: Urology;  Laterality: N/A;   CYSTOSCOPY WITH STENT PLACEMENT Right 06/11/2019   Procedure: CYSTOSCOPY,RIGHT RETROGRADE WITH STENT PLACEMENT;  Surgeon: Irine Seal, MD;  Location: [facility];  Service: Urology;  Laterality: Right;   CYSTOSCOPY WITH STENT PLACEMENT Right 03/03/2020   Procedure: CYSTOSCOPY WITH STENT PLACEMENT retroagrade pylerogram;  Surgeon: Ardis Hughs, MD;  [facility];  Service: Urology;  Laterality: Right;   CYSTOSCOPY/RETROGRADE/URETEROSCOPY/STONE EXTRACTION WITH BASKET Bilateral 05/01/2019   Procedure: CYSTOSCOPY/URETEROSCOPY/STONE EXTRACTION / LASER LITHOTRIPSY, BILATERAL URETEROSCOPY WITH URETERAL STONE EXTRACTION AND STENT EXCHANGE;  Surgeon: Ceasar Mons, MD;  Location: [facility];  Service: Urology;  Laterality: Bilateral;   CYSTOSCOPY/URETEROSCOPY/HOLMIUM LASER/STENT PLACEMENT Right 06/27/2019   Procedure: CYSTOSCOPY/URETEROSCOPY/HOLMIUM LASER/STENT EXCHANGE;  Surgeon: Ceasar Mons, MD;  Location: [facility];  Service: Urology;  Laterality: Right;   EMBOLECTOMY Left 02/24/2017   Procedure: Left Lower Extremity Embolectomy and Angiogram.;  Surgeon: Waynetta Sandy, MD;  Location: [place];  Service: Vascular;  Laterality: Left;   INSERTION OF ILIAC STENT  02/24/2017   Procedure: INSERTION OF Common ILIAC STENT;  Surgeon: Waynetta Sandy, MD;  Location: [place];  Service: Vascular;;   INTRAOPERATIVE ARTERIOGRAM Left 02/25/2018   Procedure: INTRA OPERATIVE ARTERIOGRAM WITH LEFT LEG RUNOFF;  Surgeon: Conrad Holland, MD;  Location: [place];  Service: Vascular;  Laterality: Left;   LOWER EXTREMITY ANGIOGRAM Left 02/24/2017   Procedure: Aortagram, Left lower extremity Run-off;  Surgeon: Waynetta Sandy, MD;  Location: [place];  Service: Vascular;  Laterality: Left;   LOWER EXTREMITY ANGIOGRAM Left 12/28/2020   Procedure: AORTOGRAM, LEFT LOWER EXTREMITY ANGIOGRAM,  THROMBOLYSIS VIA LEFT BRACHIAL ARTERY APPROACH;  Surgeon: Cherre Robins, MD;  Location: [place];  Service: Vascular;  Laterality: Left;   LOWER EXTREMITY ANGIOGRAPHY N/A 12/29/2020   Procedure: LYSIS RECHECK;  Surgeon: Waynetta Sandy, MD;  Location: [place] [facility];  Service: Cardiovascular;  Laterality: N/A;   PATCH ANGIOPLASTY Left 02/25/2018   Procedure: PATCH ANGIOPLASTY LEFT SUPERFICIAL FEMORAL ARTERY WITH BOVINE PATCH;  Surgeon: Conrad [place] Davis, MD;  Location: [place];  Service: Vascular;  Laterality: Left;   PERCUTANEOUS VENOUS THROMBECTOMY,LYSIS WITH INTRAVASCULAR ULTRASOUND (IVUS) Left 05/12/2018   Procedure: MECHANICAL THROMBECTOMY LEFT LEG, BALLOON ANGIOPLASTY LEFT ANTERIOR TIBIAL ARTERY, AORTOGRAM WITH LEFT LEG RUNOFF;  Surgeon: Serafina Mitchell, MD;  Location: [place];  Service: Vascular;  Laterality: Left;   PERIPHERAL VASCULAR THROMBECTOMY  12/29/2020   Procedure: PERIPHERAL VASCULAR THROMBECTOMY;  Surgeon: Waynetta Sandy, MD;  Location: [place] [facility];  Service: Cardiovascular;;   removal of parathyroid adenoma  5/11   THROMBECTOMY FEMORAL ARTERY Left 02/25/2018   Procedure: LEFT ILIAC AND POPLITEAL ARTERY THROMBECTOMY;  Surgeon: Conrad [place], MD;  Location: [place];  Service: Vascular;  Laterality: Left;   TUBAL LIGATION     WOUND DEBRIDEMENT Left 03/03/2018   Procedure: DEBRIDEMENT WOUND;  Surgeon:  Angelia Mould, MD;  Location: [place];  Service: Vascular;  Laterality: Left;   WOUND EXPLORATION Left 03/03/2018   Procedure: WOUND EXPLORATION;  Surgeon: Angelia Mould, MD;  Location: [place];  Service: Vascular;  Laterality: Left;    Allergies  Allergen Reactions   Ciprofloxacin Hcl Hives   Dilaudid [Hydromorphone Hcl] Shortness Of Breath and Other (See Comments)    "Asystole," per pt report   Macrobid [Nitrofurantoin Macrocrystal] Hives, Shortness Of Breath and Rash   Other Hives, Shortness Of Breath and Rash    NO "-CILLINS"!!!   Penicillins  Shortness Of Breath    Had had cephalosporins without incident Has patient had a PCN reaction causing immediate rash, facial/tongue/throat swelling, SOB or lightheadedness with hypotension: Yes Has patient had a PCN reaction causing severe rash involving mucus membranes or skin necrosis: Unk Has patient had a PCN reaction that required hospitalization: Unk Has patient had a PCN reaction occurring within the last 10 years: No If all of the above answers are "NO", then may proceed with Cephalosporin use.    Sulfa Antibiotics Hives, Shortness Of Breath and Rash   Lexapro [Escitalopram Oxalate] Other (See Comments)    "I just did not like it."    Prior to Admission medications   Medication Sig Start Date End Date Taking? Authorizing Provider  Accu-Chek Softclix Lancets lancets Use as instructed 07/21/21   Ladell Pier, MD  acetaminophen (TYLENOL) 500 MG tablet Take 2,000 mg by mouth daily as needed (for headaches).     [provider]  amLODipine (NORVASC) 5 MG tablet Take 1 tablet (5 mg total) by mouth daily. 01/08/21   Elsie Stain, MD  aspirin 81 MG EC tablet TAKE 1 TABLET (81 MG TOTAL) BY MOUTH DAILY. SWALLOW WHOLE. Patient taking differently: Take by mouth 5 (five) times daily. SWALLOW WHOLE. 01/02/21 01/02/22  Cherre Robins, MD  atorvastatin (LIPITOR) 40 MG tablet TAKE 1 TABLET (40 MG TOTAL) BY MOUTH DAILY. 07/22/21 07/22/22  Ladell Pier, MD  Blood Glucose Monitoring Suppl (ACCU-CHEK GUIDE ME) w/Device KIT USE AS INSTRUCTED TO CHECK BLOOD SUGAR THREE TIMES DAILY Patient not taking: Reported on 02/17/2021 01/13/21   Ladell Pier, MD  buPROPion (WELLBUTRIN SR) 150 MG 12 hr tablet TAKE 1 TABLET (150 MG TOTAL) BY MOUTH 2 (TWO) TIMES DAILY. 08/20/21   Nwoko, Terese Door, PA  carvedilol (COREG) 3.125 MG tablet TAKE ONE TABLET BY MOUTH TWICE DAILY 06/10/21   Ladell Pier, MD  Cholecalciferol (VITAMIN D-3 PO) Take 1 capsule by mouth daily.    [provider]   clonazePAM (KLONOPIN) 1 MG tablet Take 1 tablet (1 mg total) by mouth 3 (three) times daily as needed for anxiety. 08/20/21   Nwoko, Terese Door, PA  Continuous Blood Gluc Receiver (FREESTYLE LIBRE 2 READER) DEVI Check blood sugar TID. 07/23/21   Ladell Pier, MD  Continuous Blood Gluc Sensor (FREESTYLE LIBRE 2 SENSOR) MISC Check blood sugar TID. 07/23/21   Ladell Pier, MD  Continuous Blood Gluc Sensor (FREESTYLE LIBRE SENSOR SYSTEM) MISC Change sensor Q 2 wks 07/21/21   Ladell Pier, MD  ferrous sulfate 325 (65 FE) MG tablet Take 1 tablet (325 mg total) by mouth daily with breakfast. Patient not taking: Reported on 02/17/2021 03/04/20   Nita Sells, MD  fexofenadine (ALLEGRA) 60 MG tablet Take 1 tablet (60 mg total) by mouth 2 (two) times daily. 02/17/21   Ladell Pier, MD  folic acid (FOLVITE) 1 MG  tablet Take 1 tablet (1 mg total) by mouth daily. 02/17/21   Ladell Pier, MD  gabapentin (NEURONTIN) 400 MG capsule TAKE 1 CAPSULE (400 MG TOTAL) BY MOUTH 3 (THREE) TIMES DAILY. 08/31/21   Ladell Pier, MD  glucose blood (ACCU-CHEK GUIDE) test strip Use as instructed 07/21/21   Ladell Pier, MD  hydrOXYzine (ATARAX) 50 MG tablet TAKE 1 TABLET (50 MG TOTAL) BY MOUTH 3 (THREE) TIMES DAILY AS NEEDED. 09/11/21   Nwoko, Isidoro Donning E, PA  insulin glargine (LANTUS SOLOSTAR) 100 UNIT/ML Solostar Pen Inject 65 Units into the skin daily. 07/21/21   Ladell Pier, MD  Insulin Pen Needle 32G X 4 MM MISC USE AS DIRECTED Patient not taking: Reported on 02/17/2021 01/02/21 01/02/22  Cherre Robins, MD  Insulin Syringes, Disposable, U-100 1 ML MISC Use as directed Patient not taking: Reported on 02/17/2021 09/24/19   Darliss Cheney, MD  Multiple Vitamins-Minerals (ONE-A-DAY WOMENS PO) Take 1 tablet by mouth daily.    [provider]  nicotine (NICODERM CQ - DOSED IN MG/24 HOURS) 21 mg/24hr patch PLACE 1 PATCH (21 MG TOTAL) ONTO THE SKIN DAILY. 09/15/21 09/15/22   Ladell Pier, MD  nitroGLYCERIN (NITROSTAT) 0.4 MG SL tablet PLACE 1 TABLET (0.4 MG TOTAL) UNDER THE TONGUE EVERY 5 (FIVE) MINUTES AS NEEDED FOR CHEST PAIN. 05/22/20 05/22/21  Elouise Munroe, MD  NOVOLOG FLEXPEN 100 UNIT/ML FlexPen Use as directed per sliding scale, Pt is instructed about direction, Maximum 45 units per day  CBG 70 - 120: 0 units CBG 121 - 150: 2 units CBG 151 - 200: 3 units CBG 201 - 250: 5 units CBG 251 - 300: 8 units CBG 301 - 350: 11 units CBG 351 - 400: 15 units CBG > 400: Call Primary provider 08/04/21   Ladell Pier, MD  olopatadine (PATANOL) 0.1 % ophthalmic solution Place 1 drop into both eyes 2 (two) times daily. 02/17/21   Ladell Pier, MD  ondansetron (ZOFRAN) 4 MG tablet Take 1 tablet (4 mg total) by mouth every 8 (eight) hours as needed for nausea or vomiting. 01/13/21   Cherre Robins, MD  sertraline (ZOLOFT) 100 MG tablet TAKE 1 AND 1/2 TABLET (150 MG) BY MOUTH DAILY. 09/11/21   Nwoko, Terese Door, PA  sucralfate (CARAFATE) 1 GM/10ML suspension TAKE 10 MLS (1 G TOTAL) BY MOUTH 2 (TWO) TIMES DAILY WITH BREAKFAST AND LUNCH. 04/07/21   Ladell Pier, MD  traZODone (DESYREL) 100 MG tablet TAKE 1 TABLET (100 MG TOTAL) BY MOUTH AT BEDTIME. 09/11/21   Nwoko, Terese Door, PA  TRUEPLUS INSULIN SYRINGE 31G X 5/16" 1 ML MISC USE AS DIRECTED 05/21/20   Ladell Pier, MD  VENTOLIN HFA 108 (90 Base) MCG/ACT inhaler INHALE TWO PUFFS BY MOUTH EVERY SIX HOURS AS NEEDED FOR WHEEZING OR SHORTNESS OF BREATH 08/31/21   Ladell Pier, MD  vitamin B-12 (CYANOCOBALAMIN) 1000 MCG tablet Take 2 tablets (2,000 mcg total) by mouth daily. 02/17/21   Ladell Pier, MD  warfarin (COUMADIN) 5 MG tablet TAKE ONE TABLET ( 5 MG) BY MOUTH AT NOON ON SUN/TUES/THURS/SAT AND 2 TABLETS (10 MG) ON MON/WED/FRI 08/20/21   Ladell Pier, MD    Social History   Socioeconomic History   Marital status: Single    Spouse name: Not on file   Number of children: Not on file    Years of education: Not on file   Highest education level: Not on file  Occupational  History   Not on file  Tobacco Use   Smoking status: Some Days    Packs/day: 0.50    Years: 15.00    Pack years: 7.50    Types: Cigarettes   Smokeless tobacco: Never  Vaping Use   Vaping Use: Never used  Substance and Sexual Activity   Alcohol use: Yes    Comment: occassionally   Drug use: Yes    Types: Marijuana, Cocaine    Comment: once daily- last time used was night of 02/28/2020   Sexual activity: Yes    Partners: Male    Birth control/protection: Other-see comments    Comment: BTL  Other Topics Concern   Not on file  Social History Narrative   Not on file   Social Determinants of Health   Financial Resource Strain: Not on file  Food Insecurity: Not on file  Transportation Needs: Not on file  Physical Activity: Not on file  Stress: Not on file  Social Connections: Not on file  Intimate Partner Violence: Not on file     Family History  Problem Relation Age of Onset   Depression Mother    Diabetes Father    Heart failure Father     ROS: _0  Positive   _1  Negative   _2  All sytems reviewed and are negative  Cardiac: _3  chest pain/pressure _4  palpitations _5  SOB lying flat _6  DOE  Vascular: _7  pain in legs while walking _8  pain in legs at rest _9  pain in legs at night _10  non-healing ulcers _11  hx of DVT _12  swelling in legs  Pulmonary: _13  asthma/wheezing _14  home O2  Neurologic: _15  hx of CVA _16  mini stroke   Hematologic: _17  hx of cancer  Endocrine:   _18  diabetes _19  thyroid disease  GI _20  GERD  GU: _21  CKD/renal failure _22  HD--_23  M/W/F or _24  T/T/S  Psychiatric: _25  anxiety _26  depression  Musculoskeletal: _27  arthritis _28  joint pain  Integumentary: _29  rashes _30  ulcers  Constitutional: _31  fever  _32  chills  Physical Examination  Vitals:   09/17/21 0659 09/17/21 0935  BP: (!) 131/109 118/61  Pulse: (!) 102 99  Resp: 16 16  Temp:   99 F (37.2 C)  SpO2: 99% 99%   Body mass index is 39.47 kg/m.  General:  WDWN in NAD Gait: Not observed HENT: WNL, normocephalic Pulmonary: normal non-labored breathing Cardiac: regular Abdomen:  soft, NT/ND Skin: without rashes Vascular Exam/Pulses: Palpable right femoral and DP pulse; unable to obtain doppler signal left foot.  Unable to palpate left femoral but doppler signal present.  Her left foot is somewhat mottled but motor and sensory are in tact.   Extremities:   Musculoskeletal: no muscle wasting or atrophy  Neurologic: A&O X 3; speech is fluent/normal Psychiatric:  The pt has Normal affect.   CBC    Component Value Date/Time   WBC 14.5 (H) 09/17/2021 0406   RBC 4.21 09/17/2021 0406   HGB 10.6 (L) 09/17/2021 0406   HGB 11.8 07/21/2021 1515   HCT 33.6 (L) 09/17/2021 0406   HCT 36.8 07/21/2021 1515   PLT 507 (H) 09/17/2021 0406   PLT 439 07/21/2021 1515   MCV 79.8 (L) 09/17/2021 0406   MCV 80 07/21/2021 1515   MCH 25.2 (L) 09/17/2021 0406   MCHC 31.5 09/17/2021 0406   RDW 16.9 (H) 09/17/2021 0406   RDW 16.2 (H) 07/21/2021 1515   LYMPHSABS 2.9 09/17/2021 0406   LYMPHSABS 3.1 01/21/2020 1603   MONOABS 0.9 09/17/2021 0406  EOSABS 0.5 09/17/2021 0406   EOSABS 0.4 01/21/2020 1603   BASOSABS 0.1 09/17/2021 0406   BASOSABS 0.1 01/21/2020 1603    BMET    Component Value Date/Time   NA 130 (L) 09/17/2021 0406   NA 138 07/21/2021 1515   K 4.1 09/17/2021 0406   CL 98 09/17/2021 0406   CO2 25 09/17/2021 0406   GLUCOSE 396 (H) 09/17/2021 0406   BUN 13 09/17/2021 0406   BUN 13 07/21/2021 1515   CREATININE 1.67 (H) 09/17/2021 0406   CREATININE 1.88 (H) 02/19/2020 1555   CALCIUM 9.3 09/17/2021 0406   GFRNONAA 38 (L) 09/17/2021 0406   GFRNONAA 32 (L) 02/19/2020 1555   GFRAA 30 (L) 07/29/2020 1528   GFRAA 37 (L) 02/19/2020 1555    COAGS: Lab Results  Component Value Date   INR 1.8 (H) 09/17/2021   INR 1.2 (A) 02/17/2021   INR 2.0 (H) 01/06/2021      Non-Invasive Vascular Imaging:   ABI/arterial duplex pending   ASSESSMENT/PLAN: This is a 45 y.o. female with significant hx of LLE ischemia who presents today with recurrent rest pain/ischemia  -pt with recurrent ischemic pain.  She has easily palpable right femoral and DP pulses, however, the left distal doppler signal is not heard and her left femoral pulse is not palpable but there is a doppler signal present.  Her INR is subtherapeutic  -plan is to take pt to the cath lab to start thrombolytics via right femoral artery.  Pt has not eaten or drank anything in the past 8 hours.  She will return to cath lab tomorrow for recheck.  Dr. Carlis Abbott discussed with pt and she is in agreement to proceed.  -heparin gtt per pharmacy to start now   Leontine Locket, PA-C [facility] [phone]   I have seen and evaluated the patient. I agree with the PA note as documented above.  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  She had a remote left common iliac stent at time of open left lower extremity thromboembolectomy by Dr. Donzetta Matters in 2018.  She last underwent left lower extremity mechanical thrombectomy in March of this year 2022.  She has had multiple recurrent clotting episodes and has been maintained on Coumadin.  She states that she has been taking her Coumadin.  Her INR today is 1.8.  She describes acute onset left foot pain that started about 2 to 3 days ago.  She is motor intact to the left foot although does have some mottling.  I cannot appreciate a left femoral pulse.  She does have a palpable right femoral pulse.  I have discussed with Dr. Donzetta Matters who feels that her best option would be left lower extremity angiogram with initiation of thrombolysis.  I discussed all this with the patient.  She has no contraindications.  We will add her onto the Cath Lab today.  Please keep NPO.  Heparin drip ordered.  Marty Heck,  MD [facility] [place] Office: [phone]

## 2021-09-17 NOTE — Progress Notes (Signed)
[place] for heparin Indication: LLE ischemia  Allergies  Allergen Reactions   Ciprofloxacin Hcl Hives   Dilaudid [Hydromorphone Hcl] Shortness Of Breath and Other (See Comments)    "Asystole," per pt report   Macrobid [Nitrofurantoin Macrocrystal] Hives, Shortness Of Breath and Rash   Other Hives, Shortness Of Breath and Rash    NO "-CILLINS"!!!   Penicillins Shortness Of Breath    Had had cephalosporins without incident Has patient had a PCN reaction causing immediate rash, facial/tongue/throat swelling, SOB or lightheadedness with hypotension: Yes Has patient had a PCN reaction causing severe rash involving mucus membranes or skin necrosis: Unk Has patient had a PCN reaction that required hospitalization: Unk Has patient had a PCN reaction occurring within the last 10 years: No If all of the above answers are "NO", then may proceed with Cephalosporin use.    Sulfa Antibiotics Hives, Shortness Of Breath and Rash   Lexapro [Escitalopram Oxalate] Other (See Comments)    "I just did not like it."    Patient Measurements: Height: 5\' 7"  (170.2 cm) Weight: 114.3 kg (252 lb) IBW/kg (Calculated) : 61.6 Heparin Dosing Weight: 88.2 kg  Vital Signs: Temp: 99 F (37.2 C) (12/15 0935) Temp Source: Oral (12/15 0935) BP: 118/61 (12/15 0935) Pulse Rate: 90 (12/15 1600)  Labs: Recent Labs    09/17/21 0406 09/17/21 0623  HGB 10.6*  --   HCT 33.6*  --   PLT 507*  --   APTT 55*  --   LABPROT 21.0*  --   INR 1.8*  --   CREATININE 1.67*  --   TROPONINIHS 21* 8     Estimated Creatinine Clearance: 55 mL/min (A) (by C-G formula based on SCr of 1.67 mg/dL (H)).   Medical History: Past Medical History:  Diagnosis Date   Anxiety    Depression    Diabetes ([place])    Type 2   GERD (gastroesophageal reflux disease)    History of blood clots    ,DVT-left leg, and early 2000, had blood clot behind left breast   History of kidney stones     Hypercalcemia    Hyperparathyroidism    Hypertension    had been on Lisinopril and PCP took her off it 4 months ago   Iron deficiency anemia    Left thyroid nodule    Mass of right ovary    Nephrolithiasis    PAOD (peripheral arterial occlusive disease) (HCC)    Peripheral vascular disease (HCC)    Prolonged Q-T interval on ECG 04/19/2019   Renal calculi    Substance abuse (HCC)    Tooth loose    top tooth from the right  is loose     Medications: see MAR  Assessment: 45 yo F with LLE ischemia - no pedal or posterior tibial pulse. Cap refill is > 3 secs. Pt on warfarin PTA for extensive vascular hx - INR 1.8 this AM. Hg 10.6, plt 507. Heparin was initiated at 1250 units/hr prior to procedure  Patient now s/p cath lab with Vascular with lytics started post-procedure, alteplase at 1mg /hr intra-catheter and heparin reduced to 800 units/hr post-procedure per Dr. Carlis Abbott. Plan overnight is to target heparin level 0.2-0.5 per Vascular and return tomorrow for thrombolytics catheter check.  F/u med rec for PTA warfarin regimen to resume afterwards.  Goal of Therapy: Heparin level 0.2-0.5 units/ml while patient on lytics Monitor platelets by anticoagulation protocol: Yes   Plan:  Continue heparin infusion reduced to 800  units/hr post-procedure per Vascular for now Alteplase IK at 1mg /hr Check 6hr heparin level Monitor daily CBC, s/sx bleeding Plan return to cath labs 12/16   Arturo Morton, PharmD, BCPS Please check AMION for all [place] contact numbers Clinical Pharmacist 09/17/2021 4:54 PM

## 2021-09-17 NOTE — ED Triage Notes (Signed)
Patient presents to ed via [facility] c/o left lower leg pain onset 2 days ago worse today, states it hurts to walk.

## 2021-09-17 NOTE — ED Provider Notes (Signed)
MSE was initiated and I personally evaluated the patient and placed orders (if any) at  3:53 AM on September 17, 2021.  Here for evaluation of red, painful left foot for 2-3 days, worse tonight. The pain starts on the lateral lower leg. H/O DVT, patient reports compliance with Coumadin. Also complains of SOB, CP, has nausea/vomiting on arrival to ED.   Left foot red, no skin breakdown. Redness extends to lateral distal lower leg. Not warm to touch.  Actively vomiting  The patient appears stable so that the remainder of the MSE may be completed by another provider.   Charlann Lange, PA-C 09/17/21 0355    Quintella Reichert, MD 09/17/21 [phone]

## 2021-09-17 NOTE — ED Notes (Signed)
PA [place] was unable to find a pedal or posterior tibial pulse of left foot with doppler. Pt's foot is cold and purple. Cap refill is a little greater than 3 sec.

## 2021-09-17 NOTE — Progress Notes (Signed)
ANTICOAGULATION CONSULT NOTE - Initial Consult  Pharmacy Consult for heparin Indication: LLE ischemia  Allergies  Allergen Reactions   Ciprofloxacin Hcl Hives   Dilaudid [Hydromorphone Hcl] Shortness Of Breath and Other (See Comments)    "Asystole," per pt report   Macrobid [Nitrofurantoin Macrocrystal] Hives, Shortness Of Breath and Rash   Other Hives, Shortness Of Breath and Rash    NO "-CILLINS"!!!   Penicillins Shortness Of Breath    Had had cephalosporins without incident Has patient had a PCN reaction causing immediate rash, facial/tongue/throat swelling, SOB or lightheadedness with hypotension: Yes Has patient had a PCN reaction causing severe rash involving mucus membranes or skin necrosis: Unk Has patient had a PCN reaction that required hospitalization: Unk Has patient had a PCN reaction occurring within the last 10 years: No If all of the above answers are "NO", then may proceed with Cephalosporin use.    Sulfa Antibiotics Hives, Shortness Of Breath and Rash   Lexapro [Escitalopram Oxalate] Other (See Comments)    "I just did not like it."    Patient Measurements: Height: 5\' 7"  (170.2 cm) Weight: 114.3 kg (252 lb) IBW/kg (Calculated) : 61.6 Heparin Dosing Weight: 88.2 kg  Vital Signs: Temp: 99 F (37.2 C) (12/15 0935) Temp Source: Oral (12/15 0935) BP: 118/61 (12/15 0935) Pulse Rate: 99 (12/15 0935)  Labs: Recent Labs    09/17/21 0406 09/17/21 0623  HGB 10.6*  --   HCT 33.6*  --   PLT 507*  --   APTT 55*  --   LABPROT 21.0*  --   INR 1.8*  --   CREATININE 1.67*  --   TROPONINIHS 21* 8    Estimated Creatinine Clearance: 55 mL/min (A) (by C-G formula based on SCr of 1.67 mg/dL (H)).   Medical History: Past Medical History:  Diagnosis Date   Anxiety    Depression    Diabetes ([place])    Type 2   GERD (gastroesophageal reflux disease)    History of blood clots    ,DVT-left leg, and early 2000, had blood clot behind left breast   History of kidney  stones    Hypercalcemia    Hyperparathyroidism    Hypertension    had been on Lisinopril and PCP took her off it 4 months ago   Iron deficiency anemia    Left thyroid nodule    Mass of right ovary    Nephrolithiasis    PAOD (peripheral arterial occlusive disease) (H[facility])    Peripheral vascular disease ([facility])    Prolonged Q-T interval on ECG 04/19/2019   Renal calculi    Substance abuse ([facility])    Tooth loose    top tooth from the right  is loose     Medications: see MAR  Assessment: 45 yo F with LLE ischemia - no pedal or posterior tibial pulse. Cap refill is > 3 secs. Pt on warfarin PTA for extensive vascular hx - INR 1.8 this AM.  F/u med rec for PTA warfarin regi to resume afterwards.  Goal of Therapy: INR < 2  Heparin level 0.3-0.7 units/ml Monitor platelets by anticoagulation protocol: Yes   Plan: No heparin bolus given PTA warfarin. Heparin infusion at 1250 units/hr F/u 6h HL Monitor daily HL, CBC and INR (trending x 3 days - please turn off heparin infusion if INR ever is > 2)  Joetta Manners, PharmD, [facility] Emergency Medicine Clinical Pharmacist ED RPh Phone: [phone] Main RX: [phone]

## 2021-09-17 NOTE — ED Provider Notes (Signed)
[facility] EMERGENCY DEPARTMENT Provider Note   CSN: [id number] Arrival date & time: 09/17/21  0344     History Chief Complaint  Patient presents with   Leg Pain    Terri Sparks is a 45 y.o. female.  HPI Patient is a 45 year old female presented emergency room today with complaint of pain in her left foot for the past 3 days.  She states that the pain has been progressively worsening.  Somewhat improved with hanging her leg off the side of the bed but is constant and waxing and waning.  She is also complaining of some cough congestion and fatigue and nausea.  She states she has had no vomiting while here in the ER but did have a couple episodes of emesis prior to arrival.  She describes them as nonbloody nonbilious emesis episodes.  She describes a pain in her left leg is constant achy occasionally sharp severe 10/10  She states that it seems to radiate up her calf somewhat.  She denies any trauma to her leg.    Past Medical History:  Diagnosis Date   Anxiety    Depression    Diabetes ([place])    Type 2   GERD (gastroesophageal reflux disease)    History of blood clots    ,DVT-left leg, and early 2000, had blood clot behind left breast   History of kidney stones    Hypercalcemia    Hyperparathyroidism    Hypertension    had been on Lisinopril and PCP took her off it 4 months ago   Iron deficiency anemia    Left thyroid nodule    Mass of right ovary    Nephrolithiasis    PAOD (peripheral arterial occlusive disease) ([place])    Peripheral vascular disease (HCC)    Prolonged Q-T interval on ECG 04/19/2019   Renal calculi    Substance abuse ([place])    Tooth loose    top tooth from the right  is loose     Patient Active Problem List   Diagnosis Date Noted   Critical lower limb ischemia ([place]) 09/17/2021   Major depressive disorder, recurrent episode with anxious distress ([place]) 02/17/2021   Class 3 severe obesity due to excess calories with serious  comorbidity and body mass index (BMI) of 40.0 to 44.9 in adult ([place]) 02/17/2021   Hypercoagulable state ([place]) 02/17/2021   Hyperlipidemia associated with type 2 diabetes mellitus ([place]) 01/08/2021   Critical limb ischemia with history of revascularization of same extremity ([place]) 12/27/2020   Cocaine use disorder, moderate, dependence ([place]) 07/16/2020   Cannabis use disorder, moderate, dependence ([place]) 07/16/2020   Homeless 11/13/2019   Chest pain at rest 09/24/2019   Polysubstance abuse ([place]) 09/23/2019   Stage 3b chronic kidney disease ([place]) 07/30/2019   Iron deficiency anemia due to chronic blood loss 06/12/2019   Vitamin B12 deficiency 06/12/2019   Folate deficiency 06/12/2019   Constipation 06/12/2019   Prolonged QT interval    HTN (hypertension) 06/26/2018   Subtherapeutic international normalized ratio (INR) 06/26/2018   Adrenal nodule ([place]) 06/21/2018   Lung nodules 06/21/2018   Abnormal uterine bleeding (AUB) 05/14/2018   Ischemia of extremity 05/12/2018   Former smoker 03/14/2018   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/25/2018   Thromboembolism ([place]) 02/25/2018   Microalbuminuria 09/29/2017   Insomnia 09/29/2017   Anxiety and depression 04/28/2017   Neuropathy of left lower extremity 04/28/2017   Iliac artery occlusion, left ([place]) 02/25/2017   Tobacco abuse 02/25/2017  Insulin-requiring or dependent type II diabetes mellitus ([place]) 02/25/2017   Peripheral vascular disease of lower extremity ([place]) 02/24/2017   GERD (gastroesophageal reflux disease)    Hyperparathyroidism    Anxiety state     Past Surgical History:  Procedure Laterality Date   ABDOMINAL AORTOGRAM W/LOWER EXTREMITY N/A 11/14/2017   Procedure: ABDOMINAL AORTOGRAM W/LOWER EXTREMITY;  Surgeon: Waynetta Sandy, MD;  Location: [place] [facility];  Service: Cardiovascular;  Laterality: N/A;   ANGIOPLASTY ILLIAC ARTERY Left 02/25/2018   Procedure: BALLOON ANGIOPLASTY LEFT ILIAC ARTERY;  Surgeon:  Conrad Sargent, MD;  Location: [place];  Service: Vascular;  Laterality: Left;   APPLICATION OF WOUND VAC Left 03/03/2018   Procedure: APPLICATION OF WOUND VAC;  Surgeon: Angelia Mould, MD;  Location: [facility];  Service: Vascular;  Laterality: Left;   CYSTOSCOPY W/ URETERAL STENT PLACEMENT Right 06/27/2018   Procedure: CYSTOSCOPY WITH RETROGRADE PYELOGRAM/URETERAL DOUBLE J STENT PLACEMENT;  Surgeon: Ceasar Mons, MD;  Location: [place];  Service: Urology;  Laterality: Right;   CYSTOSCOPY WITH RETROGRADE PYELOGRAM, URETEROSCOPY AND STENT PLACEMENT Right 04/03/2020   Procedure: CYSTOSCOPY WITH RIGHT  RETROGRADE PYELOGRAM, URETEROSCOPY WITH HOLMIUM LASER AND STENT EXCHANGE PLACEMENT;  Surgeon: Ardis Hughs, MD;  Location: [facility];  Service: Urology;  Laterality: Right;   CYSTOSCOPY WITH STENT PLACEMENT N/A 02/23/2019   Procedure: CYSTOSCOPY WITH RIGHT URETERAL STENT EXCHANGE/ LEFT URETERAL STENT PLACEMENT;  Surgeon: Ceasar Mons, MD;  Location: [facility];  Service: Urology;  Laterality: N/A;   CYSTOSCOPY WITH STENT PLACEMENT Right 06/11/2019   Procedure: CYSTOSCOPY,RIGHT RETROGRADE WITH STENT PLACEMENT;  Surgeon: Irine Seal, MD;  [facility];  Service: Urology;  Laterality: Right;   CYSTOSCOPY WITH STENT PLACEMENT Right 03/03/2020   Procedure: CYSTOSCOPY WITH STENT PLACEMENT retroagrade pylerogram;  Surgeon: Ardis Hughs, MD;  [facility];  Service: Urology;  Laterality: Right;   CYSTOSCOPY/RETROGRADE/URETEROSCOPY/STONE EXTRACTION WITH BASKET Bilateral 05/01/2019   Procedure: CYSTOSCOPY/URETEROSCOPY/STONE EXTRACTION / LASER LITHOTRIPSY, BILATERAL URETEROSCOPY WITH URETERAL STONE EXTRACTION AND STENT EXCHANGE;  Surgeon: Ceasar Mons, MD;  Location: [facility];  Service: Urology;  Laterality: Bilateral;   CYSTOSCOPY/URETEROSCOPY/HOLMIUM LASER/STENT PLACEMENT Right 06/27/2019   Procedure: CYSTOSCOPY/URETEROSCOPY/HOLMIUM LASER/STENT  EXCHANGE;  Surgeon: Ceasar Mons, MD;  [facility]: [facility];  Service: Urology;  Laterality: Right;   EMBOLECTOMY Left 02/24/2017   Procedure: Left Lower Extremity Embolectomy and Angiogram.;  Surgeon: Waynetta Sandy, MD;  Location: [place];  Service: Vascular;  Laterality: Left;   INSERTION OF ILIAC STENT  02/24/2017   Procedure: INSERTION OF Common ILIAC STENT;  Surgeon: Waynetta Sandy, MD;  Location: [place];  Service: Vascular;;   INTRAOPERATIVE ARTERIOGRAM Left 02/25/2018   Procedure: INTRA OPERATIVE ARTERIOGRAM WITH LEFT LEG RUNOFF;  Surgeon: Conrad Milan, MD;  Location: [place];  Service: Vascular;  Laterality: Left;   LOWER EXTREMITY ANGIOGRAM Left 02/24/2017   Procedure: Aortagram, Left lower extremity Run-off;  Surgeon: Waynetta Sandy, MD;  Location: [place];  Service: Vascular;  Laterality: Left;   LOWER EXTREMITY ANGIOGRAM Left 12/28/2020   Procedure: AORTOGRAM, LEFT LOWER EXTREMITY ANGIOGRAM, THROMBOLYSIS VIA LEFT BRACHIAL ARTERY APPROACH;  Surgeon: Cherre Robins, MD;  Location: [place];  Service: Vascular;  Laterality: Left;   LOWER EXTREMITY ANGIOGRAPHY N/A 12/29/2020   Procedure: LYSIS RECHECK;  Surgeon: Waynetta Sandy, MD;  Location: [place] [facility];  Service: Cardiovascular;  Laterality: N/A;   PATCH ANGIOPLASTY Left 02/25/2018   Procedure: PATCH ANGIOPLASTY LEFT SUPERFICIAL FEMORAL ARTERY  WITH BOVINE PATCH;  Surgeon: Conrad [place], MD;  Location: [place];  Service: Vascular;  Laterality: Left;   PERCUTANEOUS VENOUS THROMBECTOMY,LYSIS WITH INTRAVASCULAR ULTRASOUND (IVUS) Left 05/12/2018   Procedure: MECHANICAL THROMBECTOMY LEFT LEG, BALLOON ANGIOPLASTY LEFT ANTERIOR TIBIAL ARTERY, AORTOGRAM WITH LEFT LEG RUNOFF;  Surgeon: Serafina Mitchell, MD;  Location: [place];  Service: Vascular;  Laterality: Left;   PERIPHERAL VASCULAR THROMBECTOMY  12/29/2020   Procedure: PERIPHERAL VASCULAR THROMBECTOMY;  Surgeon: Waynetta Sandy, MD;  Location:  [place] [facility];  Service: Cardiovascular;;   removal of parathyroid adenoma  5/11   THROMBECTOMY FEMORAL ARTERY Left 02/25/2018   Procedure: LEFT ILIAC AND POPLITEAL ARTERY THROMBECTOMY;  Surgeon: Conrad [place], MD;  Location: [place];  Service: Vascular;  Laterality: Left;   TUBAL LIGATION     WOUND DEBRIDEMENT Left 03/03/2018   Procedure: DEBRIDEMENT WOUND;  Surgeon: Angelia Mould, MD;  Location: [place];  Service: Vascular;  Laterality: Left;   WOUND EXPLORATION Left 03/03/2018   Procedure: WOUND EXPLORATION;  Surgeon: Angelia Mould, MD;  Location: [place];  Service: Vascular;  Laterality: Left;     OB History     Gravida  5   Para  3   Term  2   Preterm  1   AB  2   Living  3      SAB  2   IAB      Ectopic      Multiple      Live Births              Family History  Problem Relation Age of Onset   Depression Mother    Diabetes Father    Heart failure Father     Social History   Tobacco Use   Smoking status: Some Days    Packs/day: 0.50    Years: 15.00    Pack years: 7.50    Types: Cigarettes   Smokeless tobacco: Never  Vaping Use   Vaping Use: Never used  Substance Use Topics   Alcohol use: Yes    Comment: occassionally   Drug use: Yes    Types: Marijuana, Cocaine    Comment: once daily- last time used was night of 02/28/2020    Home Medications Prior to Admission medications   Medication Sig Start Date End Date Taking? Authorizing Provider  Accu-Chek Softclix Lancets lancets Use as instructed 07/21/21   Ladell Pier, MD  acetaminophen (TYLENOL) 500 MG tablet Take 2,000 mg by mouth daily as needed (for headaches).     [provider]  amLODipine (NORVASC) 5 MG tablet Take 1 tablet (5 mg total) by mouth daily. 01/08/21   Elsie Stain, MD  aspirin 81 MG EC tablet TAKE 1 TABLET (81 MG TOTAL) BY MOUTH DAILY. SWALLOW WHOLE. Patient taking differently: Take by mouth 5 (five) times daily. SWALLOW WHOLE. 01/02/21  01/02/22  Cherre Robins, MD  atorvastatin (LIPITOR) 40 MG tablet TAKE 1 TABLET (40 MG TOTAL) BY MOUTH DAILY. 07/22/21 07/22/22  Ladell Pier, MD  Blood Glucose Monitoring Suppl (ACCU-CHEK GUIDE ME) w/Device KIT USE AS INSTRUCTED TO CHECK BLOOD SUGAR THREE TIMES DAILY Patient not taking: Reported on 02/17/2021 01/13/21   Ladell Pier, MD  buPROPion (WELLBUTRIN SR) 150 MG 12 hr tablet TAKE 1 TABLET (150 MG TOTAL) BY MOUTH 2 (TWO) TIMES DAILY. 08/20/21   Nwoko, Terese Door, PA  carvedilol (COREG) 3.125 MG tablet TAKE ONE TABLET BY MOUTH TWICE DAILY 06/10/21  Ladell Pier, MD  Cholecalciferol (VITAMIN D-3 PO) Take 1 capsule by mouth daily.    [provider]  clonazePAM (KLONOPIN) 1 MG tablet Take 1 tablet (1 mg total) by mouth 3 (three) times daily as needed for anxiety. 08/20/21   Nwoko, Terese Door, PA  Continuous Blood Gluc Receiver (FREESTYLE LIBRE 2 READER) DEVI Check blood sugar TID. 07/23/21   Ladell Pier, MD  Continuous Blood Gluc Sensor (FREESTYLE LIBRE 2 SENSOR) MISC Check blood sugar TID. 07/23/21   Ladell Pier, MD  Continuous Blood Gluc Sensor (FREESTYLE LIBRE SENSOR SYSTEM) MISC Change sensor Q 2 wks 07/21/21   Ladell Pier, MD  ferrous sulfate 325 (65 FE) MG tablet Take 1 tablet (325 mg total) by mouth daily with breakfast. Patient not taking: Reported on 02/17/2021 03/04/20   Nita Sells, MD  fexofenadine (ALLEGRA) 60 MG tablet Take 1 tablet (60 mg total) by mouth 2 (two) times daily. 02/17/21   Ladell Pier, MD  folic acid (FOLVITE) 1 MG tablet Take 1 tablet (1 mg total) by mouth daily. 02/17/21   Ladell Pier, MD  gabapentin (NEURONTIN) 400 MG capsule TAKE 1 CAPSULE (400 MG TOTAL) BY MOUTH 3 (THREE) TIMES DAILY. 08/31/21   Ladell Pier, MD  glucose blood (ACCU-CHEK GUIDE) test strip Use as instructed 07/21/21   Ladell Pier, MD  hydrOXYzine (ATARAX) 50 MG tablet TAKE 1 TABLET (50 MG TOTAL) BY MOUTH 3 (THREE) TIMES  DAILY AS NEEDED. 09/11/21   Nwoko, Isidoro Donning E, PA  insulin glargine (LANTUS SOLOSTAR) 100 UNIT/ML Solostar Pen Inject 65 Units into the skin daily. 07/21/21   Ladell Pier, MD  Insulin Pen Needle 32G X 4 MM MISC USE AS DIRECTED Patient not taking: Reported on 02/17/2021 01/02/21 01/02/22  Cherre Robins, MD  Insulin Syringes, Disposable, U-100 1 ML MISC Use as directed Patient not taking: Reported on 02/17/2021 09/24/19   Darliss Cheney, MD  Multiple Vitamins-Minerals (ONE-A-DAY WOMENS PO) Take 1 tablet by mouth daily.    [provider]  nicotine (NICODERM CQ - DOSED IN MG/24 HOURS) 21 mg/24hr patch PLACE 1 PATCH (21 MG TOTAL) ONTO THE SKIN DAILY. 09/15/21 09/15/22  Ladell Pier, MD  nitroGLYCERIN (NITROSTAT) 0.4 MG SL tablet PLACE 1 TABLET (0.4 MG TOTAL) UNDER THE TONGUE EVERY 5 (FIVE) MINUTES AS NEEDED FOR CHEST PAIN. 05/22/20 05/22/21  Elouise Munroe, MD  NOVOLOG FLEXPEN 100 UNIT/ML FlexPen Use as directed per sliding scale, Pt is instructed about direction, Maximum 45 units per day  CBG 70 - 120: 0 units CBG 121 - 150: 2 units CBG 151 - 200: 3 units CBG 201 - 250: 5 units CBG 251 - 300: 8 units CBG 301 - 350: 11 units CBG 351 - 400: 15 units CBG > 400: Call Primary provider 08/04/21   Ladell Pier, MD  olopatadine (PATANOL) 0.1 % ophthalmic solution Place 1 drop into both eyes 2 (two) times daily. 02/17/21   Ladell Pier, MD  ondansetron (ZOFRAN) 4 MG tablet Take 1 tablet (4 mg total) by mouth every 8 (eight) hours as needed for nausea or vomiting. 01/13/21   Cherre Robins, MD  sertraline (ZOLOFT) 100 MG tablet TAKE 1 AND 1/2 TABLET (150 MG) BY MOUTH DAILY. 09/11/21   Nwoko, Terese Door, PA  sucralfate (CARAFATE) 1 GM/10ML suspension TAKE 10 MLS (1 G TOTAL) BY MOUTH 2 (TWO) TIMES DAILY WITH BREAKFAST AND LUNCH. 04/07/21   Ladell Pier, MD  traZODone (  DESYREL) 100 MG tablet TAKE 1 TABLET (100 MG TOTAL) BY MOUTH AT BEDTIME. 09/11/21   Nwoko, Terese Door, PA  TRUEPLUS  INSULIN SYRINGE 31G X 5/16" 1 ML MISC USE AS DIRECTED 05/21/20   Ladell Pier, MD  VENTOLIN HFA 108 (90 Base) MCG/ACT inhaler INHALE TWO PUFFS BY MOUTH EVERY SIX HOURS AS NEEDED FOR WHEEZING OR SHORTNESS OF BREATH 08/31/21   Ladell Pier, MD  vitamin B-12 (CYANOCOBALAMIN) 1000 MCG tablet Take 2 tablets (2,000 mcg total) by mouth daily. 02/17/21   Ladell Pier, MD  warfarin (COUMADIN) 5 MG tablet TAKE ONE TABLET ( 5 MG) BY MOUTH AT NOON ON SUN/TUES/THURS/SAT AND 2 TABLETS (10 MG) ON MON/WED/FRI 08/20/21   Ladell Pier, MD    Allergies    Ciprofloxacin hcl, Dilaudid [hydromorphone hcl], Macrobid [nitrofurantoin macrocrystal], Other, Penicillins, Sulfa antibiotics, and Lexapro [escitalopram oxalate]  Review of Systems   Review of Systems  Constitutional:  Positive for fatigue. Negative for chills and fever.  HENT:  Negative for congestion.   Eyes:  Negative for pain.  Respiratory:  Positive for cough. Negative for shortness of breath.   Cardiovascular:  Negative for chest pain and leg swelling.  Gastrointestinal:  Positive for nausea and vomiting. Negative for abdominal pain.  Genitourinary:  Negative for dysuria.  Musculoskeletal:  Negative for myalgias.  Skin:  Negative for rash.  Neurological:  Negative for dizziness and headaches.   Physical Exam Updated Vital Signs BP 118/61 (BP Location: Right Arm)    Pulse 99    Temp 99 F (37.2 C) (Oral)    Resp 16    Ht 5' 7"  (1.702 m)    Wt 114.3 kg    SpO2 99%    BMI 39.47 kg/m   Physical Exam Vitals and nursing note reviewed.  Constitutional:      General: She is not in acute distress. HENT:     Head: Normocephalic and atraumatic.     Nose: Nose normal.  Eyes:     General: No scleral icterus. Cardiovascular:     Rate and Rhythm: Normal rate and regular rhythm.     Pulses: Normal pulses.     Heart sounds: Normal heart sounds.  Pulmonary:     Effort: Pulmonary effort is normal. No respiratory distress.      Breath sounds: No wheezing.  Abdominal:     Palpations: Abdomen is soft.     Tenderness: There is no abdominal tenderness. There is no guarding or rebound.  Musculoskeletal:     Cervical back: Normal range of motion.     Comments: Left foot with no palpable pulse. Doppler was used which confirmed no pulse in left foot.   Skin:    General: Skin is warm and dry.     Capillary Refill: Capillary refill takes less than 2 seconds.     Comments: Cap refill less than 3 seconds in left toes  Skin with a slightly ruborous appearance.  No ulcerations or wounds.    Neurological:     Mental Status: She is alert. Mental status is at baseline.     Comments: Sensation intact in all aspects of left foot.  Able to wiggle toes and lift leg off bed.  Psychiatric:        Mood and Affect: Mood normal.        Behavior: Behavior normal.    ED Results / Procedures / Treatments   Labs (all labs ordered are listed, but only abnormal results are displayed)  Labs Reviewed  RESP PANEL BY RT-PCR (FLU A&B, COVID) ARPGX2 - Abnormal; Notable for the following components:      Result Value   SARS Coronavirus 2 by RT PCR POSITIVE (*)    All other components within normal limits  CBC WITH DIFFERENTIAL/PLATELET - Abnormal; Notable for the following components:   WBC 14.5 (*)    Hemoglobin 10.6 (*)    HCT 33.6 (*)    MCV 79.8 (*)    MCH 25.2 (*)    RDW 16.9 (*)    Platelets 507 (*)    Neutro Abs 10.1 (*)    Abs Immature Granulocytes 0.08 (*)    All other components within normal limits  COMPREHENSIVE METABOLIC PANEL - Abnormal; Notable for the following components:   Sodium 130 (*)    Glucose, Bld 396 (*)    Creatinine, Ser 1.67 (*)    Albumin 2.9 (*)    AST 12 (*)    Total Bilirubin 0.2 (*)    GFR, Estimated 38 (*)    All other components within normal limits  PROTIME-INR - Abnormal; Notable for the following components:   Prothrombin Time 21.0 (*)    INR 1.8 (*)    All other components within normal  limits  APTT - Abnormal; Notable for the following components:   aPTT 55 (*)    All other components within normal limits  CBG MONITORING, ED - Abnormal; Notable for the following components:   Glucose-Capillary 300 (*)    All other components within normal limits  TROPONIN I (HIGH SENSITIVITY) - Abnormal; Notable for the following components:   Troponin I (High Sensitivity) 21 (*)    All other components within normal limits  HEPARIN LEVEL (UNFRACTIONATED)  I-STAT BETA HCG BLOOD, ED ([facility], [facility], [facility] ONLY)  TROPONIN I (HIGH SENSITIVITY)    EKG None  Radiology DG Chest 2 View  Result Date: 09/17/2021 CLINICAL DATA:  Cough EXAM: CHEST - 2 VIEW COMPARISON:  12/28/2020 FINDINGS: Normal heart size and mediastinal contours. No acute infiltrate or edema. No effusion or pneumothorax. No acute osseous findings. IMPRESSION: Negative chest. Electronically Signed   By: Jorje Guild M.D.   On: 09/17/2021 04:40   PERIPHERAL VASCULAR CATHETERIZATION  Result Date: 09/17/2021 Patient name: TALOR DESROSIERS         MRN: [id number]        DOB: 1975/12/28          Sex: female  09/17/2021 Pre-operative Diagnosis: Acute ischemia left lower extremity Post-operative diagnosis:  Same Surgeon:  Marty Heck, MD Procedure Performed: 1.  Ultrasound-guided access right common femoral artery 2.  Aortogram with catheter selection of aorta 3.  Left lower extremity arteriogram with selection of third order branches 4.  Initiation of left lower extremity thrombolysis  Indications: Patient is a 45 year old female who presented to the ED with 2 to 3-day history of left lower extremity pain.  She was found to have no femoral pulse and no signals in the foot.  She is motor intact.  She has had previous common iliac stent with previous left lower extremity thrombectomy by Dr. Donzetta Matters and multiple additional thrombolysis events for limb salvage of the left lower extremity.  She is taking Coumadin although subtherapeutic.  She  presents after risk benefits discussed.  Findings:  Aortogram showed that the left common and external iliac artery are occluded.  She does reconstitute some of the common femoral but this appears to be full of acute thrombus.  The SFA  and above-knee popliteal artery are patent.  There is some thrombus in the proximal profunda.  The below-knee popliteal artery is occluded.  She reconstitutes a small island of peroneal.  Thrombolysis catheter was placed from the proximal left common iliac into the distal SFA above-knee popliteal artery for initiation of thrombolysis.             Procedure:  The patient was identified in the holding area and taken to room 8.  The patient was then placed supine on the table and prepped and draped in the usual sterile fashion.  A time out was called.  Ultrasound was used to evaluate the right common femoral artery.  It was patent .  A digital ultrasound image was acquired.  A micropuncture needle was used to access the right common femoral artery under ultrasound guidance.  An 018 wire was advanced without resistance and a micropuncture sheath was placed.  The 018 wire was removed and a benson wire was placed.  The micropuncture sheath was exchanged for a 5 french sheath.  An omniflush catheter was advanced over the wire to the level of L-1.  An abdominal angiogram was obtained.  Pertinent findings are noted above but the left common and external iliac artery were occluded.  I then used a Glidewire advantage to cross the occluded segment of left iliac artery and got my wire into the left SFA and then got some staged images of the left lower extremity with pertinent findings noted above.  Ultimately I placed a 6 French Ansell sheath in the right groin up to the aortic bifurcation.  I then advanced the UniFuse catheter from the takeoff of the left iliac artery all the way down to the distal SFA above-knee popliteal artery over my glidewire advantage.  This was the longest infusion  catheter we had.  We will initiate tPA at 1 mg an hour down the unifuse catheter and heparin at 800 units an hour through the sheath.  She tolerated the procedure without any complications.   Plan: Patient will be taken to the ICU for initiation of thrombolysis.  She will return tomorrow for thrombolytics catheter check.  Marty Heck, MD [facility]: [phone]     Procedures .Critical Care Performed by: Tedd Sias, PA Authorized by: Tedd Sias, PA   Critical care provider statement:    Critical care time (minutes):  35   Critical care time was exclusive of:  Separately billable procedures and treating other patients and teaching time   Critical care was necessary to treat or prevent imminent or life-threatening deterioration of the following conditions: Acute arterial thrombosis.   Critical care was time spent personally by me on the following activities:  Development of treatment plan with patient or surrogate, review of old charts, re-evaluation of patient's condition, pulse oximetry, ordering and review of radiographic studies, ordering and review of laboratory studies, ordering and performing treatments and interventions, obtaining history from patient or surrogate, examination of patient and evaluation of patient's response to treatment   Care discussed with: admitting provider     Medications Ordered in ED Medications  0.9 %  sodium chloride infusion ( Intravenous New Bag/Given 09/17/21 1321)  pantoprazole (PROTONIX) EC tablet 40 mg ( Oral MAR Unhold 09/17/21 1550)  labetalol (NORMODYNE) injection 10 mg ( Intravenous MAR Unhold 09/17/21 1550)  hydrALAZINE (APRESOLINE) injection 5 mg ( Intravenous MAR Unhold 09/17/21 1550)  metoprolol tartrate (LOPRESSOR) injection 2-5 mg ( Intravenous MAR Unhold  09/17/21 1550)  guaiFENesin-dextromethorphan (ROBITUSSIN DM) 100-10 MG/5ML syrup 15 mL ( Oral MAR Unhold 09/17/21 1550)  phenol  (CHLORASEPTIC) mouth spray 1 spray ( Mouth/Throat MAR Unhold 09/17/21 1550)  acetaminophen (TYLENOL) tablet 325-650 mg ( Oral MAR Unhold 09/17/21 1550)    Or  acetaminophen (TYLENOL) suppository 325-650 mg ( Rectal MAR Unhold 09/17/21 1550)  oxyCODONE-acetaminophen (PERCOCET/ROXICET) 5-325 MG per tablet 1-2 tablet ( Oral MAR Unhold 09/17/21 1550)  morphine 2 MG/ML injection 2 mg ( Intravenous MAR Unhold 09/17/21 1550)  polyethylene glycol (MIRALAX / GLYCOLAX) packet 17 g ( Oral MAR Unhold 09/17/21 1550)  bisacodyl (DULCOLAX) suppository 10 mg ( Rectal MAR Unhold 09/17/21 1550)  heparin ADULT infusion 100 units/mL (25000 units/258m) (800 Units/hr Intravenous New Bag/Given 09/17/21 1527)  alteplase (LIMB ISCHEMIA) 10 mg in normal saline (0.02 mg/mL) infusion (1 mg/hr Intracatheter New Bag/Given 09/17/21 1534)  ondansetron (ZOFRAN-ODT) disintegrating tablet 4 mg (4 mg Oral Given 09/17/21 0355)  oxyCODONE-acetaminophen (PERCOCET/ROXICET) 5-325 MG per tablet 1 tablet (1 tablet Oral Given 09/17/21 0933)  sodium chloride 0.9 % bolus 1,000 mL (0 mLs Intravenous Stopped 09/17/21 1321)    ED Course  I have reviewed the triage vital signs and the nursing notes.  Pertinent labs & imaging results that were available during my care of the patient were reviewed by me and considered in my medical decision making (see chart for details).  Clinical Course as of 09/17/21 1554  Thu Sep 17, 2021  [id number] with vascular surgery Dr. CCarlis Abbott Arterial duplex ordered + DVT study. Percocet for pain [WF]    Clinical Course User Index [WF] FTedd Sias PUtah  MDM Rules/Calculators/A&P                          Patient is a 45year old female presented to the ER today with multiple symptoms primarily complaining of left lower extremity pain but also some cough congestion and nausea vomiting  Physical exam notable for pulseless left lower extremity.  I discussed this case with my attending physician who  cosigned this note including patient's presenting symptoms, physical exam, and planned diagnostics and interventions. Attending physician stated agreement with plan or made changes to plan which were implemented.   Attending physician assessed patient at bedside.   Given examination will consult cardiology.  Discussed with Dr. CCarlis AbbottDiscussed with vascular APP who assessed patient at bedside.  Patient is mildly hyponatremic this corrects to normal with accounting for hyperglycemia.  She is anemic and has a leukocytosis.  There is no evidence of cellulitis on my examination.  Creatinine is at patient's baseline.  Troponin within normal limits doubt cardiac ischemia.  I states she did negative for pregnancy. Chest x-ray unremarkable  Patient started on heparin for acute arterial thrombosis.  Patient will require admission, heparin, will be taken to the Cath Lab for vascular evaluation.   Final Clinical Impression(s) / ED Diagnoses Final diagnoses:  Left leg pain  Hyperglycemia  COVID-19  Arterial thrombosis ([facility]    Rx / DC Orders ED Discharge Orders     None        FTedd Sias PUtah12/15/22 1554    KTeressa Lower MD 09/17/21 [id number]

## 2021-09-17 NOTE — Op Note (Signed)
° ° °  Patient name: Terri Sparks MRN: [id number] DOB: 1975-10-09 Sex: female  09/17/2021 Pre-operative Diagnosis: Acute ischemia left lower extremity Post-operative diagnosis:  Same Surgeon:  Marty Heck, MD Procedure Performed: 1.  Ultrasound-guided access right common femoral artery 2.  Aortogram with catheter selection of aorta 3.  Left lower extremity arteriogram with selection of third order branches 4.  Initiation of left lower extremity thrombolysis  Indications: Patient is a 45 year old female who presented to the ED with 2 to 3-day history of left lower extremity pain.  She was found to have no femoral pulse and no signals in the foot.  She is motor intact.  She has had previous common iliac stent with previous left lower extremity thrombectomy by Dr. Donzetta Matters and multiple additional thrombolysis events for limb salvage of the left lower extremity.  She is taking Coumadin although subtherapeutic.  She presents after risk benefits discussed.  Findings:   Aortogram showed that the left common and external iliac artery are occluded.  She does reconstitute some of the common femoral but this appears to be full of acute thrombus.  The SFA and above-knee popliteal artery are patent.  There is some thrombus in the proximal profunda.  The below-knee popliteal artery is occluded.  She reconstitutes a small island of peroneal.  Thrombolysis catheter was placed from the proximal left common iliac into the distal SFA above-knee popliteal artery for initiation of thrombolysis.   Procedure:  The patient was identified in the holding area and taken to room 8.  The patient was then placed supine on the table and prepped and draped in the usual sterile fashion.  A time out was called.  Ultrasound was used to evaluate the right common femoral artery.  It was patent .  A digital ultrasound image was acquired.  A micropuncture needle was used to access the right common femoral artery under ultrasound  guidance.  An 018 wire was advanced without resistance and a micropuncture sheath was placed.  The 018 wire was removed and a benson wire was placed.  The micropuncture sheath was exchanged for a 5 french sheath.  An omniflush catheter was advanced over the wire to the level of L-1.  An abdominal angiogram was obtained.  Pertinent findings are noted above but the left common and external iliac artery were occluded.  I then used a Glidewire advantage to cross the occluded segment of left iliac artery and got my wire into the left SFA and then got some staged images of the left lower extremity with pertinent findings noted above.  Ultimately I placed a 6 French Ansell sheath in the right groin up to the aortic bifurcation.  I then advanced the UniFuse catheter from the takeoff of the left iliac artery all the way down to the distal SFA above-knee popliteal artery over my glidewire advantage.  This was the longest infusion catheter we had.  We will initiate tPA at 1 mg an hour down the unifuse catheter and heparin at 800 units an hour through the sheath.  She tolerated the procedure without any complications.   Plan: Patient will be taken to the ICU for initiation of thrombolysis.  She will return tomorrow for thrombolytics catheter check.  Marty Heck, MD [facility] [place] Office: [phone]

## 2021-09-18 ENCOUNTER — Encounter (HOSPITAL_COMMUNITY): Payer: Medicaid Other

## 2021-09-18 ENCOUNTER — Inpatient Hospital Stay (HOSPITAL_COMMUNITY)
Admission: EM | Disposition: A | Discharge status: Home / Self Care | Payer: Self-pay | Source: Home / Self Care | Attending: Vascular Surgery

## 2021-09-18 ENCOUNTER — Encounter (HOSPITAL_COMMUNITY): Payer: Self-pay | Admitting: Vascular Surgery

## 2021-09-18 DIAGNOSIS — I743 Embolism and thrombosis of arteries of the lower extremities: Secondary | ICD-10-CM

## 2021-09-18 DIAGNOSIS — T82868A Thrombosis of vascular prosthetic devices, implants and grafts, initial encounter: Secondary | ICD-10-CM

## 2021-09-18 DIAGNOSIS — E1165 Type 2 diabetes mellitus with hyperglycemia: Secondary | ICD-10-CM | POA: Diagnosis not present

## 2021-09-18 DIAGNOSIS — I70229 Atherosclerosis of native arteries of extremities with rest pain, unspecified extremity: Secondary | ICD-10-CM | POA: Diagnosis not present

## 2021-09-18 HISTORY — PX: PERIPHERAL VASCULAR THROMBECTOMY: CATH118306

## 2021-09-18 HISTORY — PX: PERIPHERAL VASCULAR INTERVENTION: CATH118257

## 2021-09-18 LAB — CBC
HCT: 32.6 % — ABNORMAL LOW (ref 36.0–46.0)
HCT: 34.6 % — ABNORMAL LOW (ref 36.0–46.0)
HCT: 29.7 % — ABNORMAL LOW (ref 36.0–46.0)
Hemoglobin: 10.3 g/dL — ABNORMAL LOW (ref 12.0–15.0)
Hemoglobin: 10.5 g/dL — ABNORMAL LOW (ref 12.0–15.0)
Hemoglobin: 9 g/dL — ABNORMAL LOW (ref 12.0–15.0)
MCH: 25.1 pg — ABNORMAL LOW (ref 26.0–34.0)
MCH: 24.5 pg — ABNORMAL LOW (ref 26.0–34.0)
MCH: 24.4 pg — ABNORMAL LOW (ref 26.0–34.0)
MCHC: 31.6 g/dL (ref 30.0–36.0)
MCHC: 30.3 g/dL (ref 30.0–36.0)
MCHC: 30.3 g/dL (ref 30.0–36.0)
MCV: 79.3 fL — ABNORMAL LOW (ref 80.0–100.0)
MCV: 80.7 fL (ref 80.0–100.0)
MCV: 80.5 fL (ref 80.0–100.0)
Platelets: 425 10*3/uL — ABNORMAL HIGH (ref 150–400)
Platelets: 437 10*3/uL — ABNORMAL HIGH (ref 150–400)
Platelets: 416 10*3/uL — ABNORMAL HIGH (ref 150–400)
RBC: 4.11 MIL/uL (ref 3.87–5.11)
RBC: 4.29 MIL/uL (ref 3.87–5.11)
RBC: 3.69 MIL/uL — ABNORMAL LOW (ref 3.87–5.11)
RDW: 16.7 % — ABNORMAL HIGH (ref 11.5–15.5)
RDW: 17.1 % — ABNORMAL HIGH (ref 11.5–15.5)
RDW: 16.9 % — ABNORMAL HIGH (ref 11.5–15.5)
WBC: 15.8 10*3/uL — ABNORMAL HIGH (ref 4.0–10.5)
WBC: 20.2 10*3/uL — ABNORMAL HIGH (ref 4.0–10.5)
WBC: 18.1 10*3/uL — ABNORMAL HIGH (ref 4.0–10.5)
nRBC: 0 % (ref 0.0–0.2)
nRBC: 0 % (ref 0.0–0.2)
nRBC: 0 % (ref 0.0–0.2)

## 2021-09-18 LAB — FIBRINOGEN
Fibrinogen: 474 mg/dL (ref 210–475)
Fibrinogen: 488 mg/dL — ABNORMAL HIGH (ref 210–475)
Fibrinogen: 486 mg/dL — ABNORMAL HIGH (ref 210–475)

## 2021-09-18 LAB — BASIC METABOLIC PANEL
Anion gap: 9 (ref 5–15)
BUN: 12 mg/dL (ref 6–20)
CO2: 21 mmol/L — ABNORMAL LOW (ref 22–32)
Calcium: 8.9 mg/dL (ref 8.9–10.3)
Chloride: 103 mmol/L (ref 98–111)
Creatinine, Ser: 1.22 mg/dL — ABNORMAL HIGH (ref 0.44–1.00)
GFR, Estimated: 55 mL/min — ABNORMAL LOW (ref >60)
Glucose, Bld: 226 mg/dL — ABNORMAL HIGH (ref 70–99)
Potassium: 3.9 mmol/L (ref 3.5–5.1)
Sodium: 133 mmol/L — ABNORMAL LOW (ref 135–145)

## 2021-09-18 LAB — GLUCOSE, CAPILLARY
Glucose-Capillary: 407 mg/dL — ABNORMAL HIGH (ref 70–99)
Glucose-Capillary: 220 mg/dL — ABNORMAL HIGH (ref 70–99)
Glucose-Capillary: 165 mg/dL — ABNORMAL HIGH (ref 70–99)
Glucose-Capillary: 244 mg/dL — ABNORMAL HIGH (ref 70–99)
Glucose-Capillary: 380 mg/dL — ABNORMAL HIGH (ref 70–99)

## 2021-09-18 LAB — HEPARIN LEVEL (UNFRACTIONATED)
Heparin Unfractionated: <0.1 IU/mL — ABNORMAL LOW (ref 0.30–0.70)
Heparin Unfractionated: <0.1 IU/mL — ABNORMAL LOW (ref 0.30–0.70)

## 2021-09-18 SURGERY — PERIPHERAL VASCULAR THROMBECTOMY
Anesthesia: LOCAL

## 2021-09-18 MED ORDER — SODIUM CHLORIDE 0.9% FLUSH: 3.0000 mL | INTRAVENOUS | Status: DC | PRN

## 2021-09-18 MED ORDER — LIDOCAINE HCL (PF) 1 % IJ SOLN
INTRAMUSCULAR | Status: AC
  Filled 2021-09-18: qty 30

## 2021-09-18 MED ORDER — LABETALOL HCL 5 MG/ML IV SOLN
10.0000 mg | INTRAVENOUS | Status: DC | PRN
  Administered 2021-09-18: 10 mg via INTRAVENOUS
  Filled 2021-09-18: qty 4

## 2021-09-18 MED ORDER — HEPARIN (PORCINE) IN NACL 2000-0.9 UNIT/L-% IV SOLN
INTRAVENOUS | Status: DC | PRN
  Administered 2021-09-18: 1000 mL

## 2021-09-18 MED ORDER — OLOPATADINE HCL 0.1 % OP SOLN
1.0000 [drp] | Freq: Two times a day (BID) | OPHTHALMIC | Status: DC
  Administered 2021-09-18 – 2021-09-26 (×15): 1 [drp] via OPHTHALMIC
  Filled 2021-09-18 (×2): qty 5

## 2021-09-18 MED ORDER — HEPARIN SODIUM (PORCINE) 1000 UNIT/ML IJ SOLN
INTRAMUSCULAR | Status: AC
  Filled 2021-09-18: qty 10

## 2021-09-18 MED ORDER — AMLODIPINE BESYLATE 5 MG PO TABS
5.0000 mg | ORAL_TABLET | Freq: Every day | ORAL | Status: DC
  Administered 2021-09-19 – 2021-09-26 (×7): 5 mg via ORAL
  Filled 2021-09-18 (×8): qty 1

## 2021-09-18 MED ORDER — ATORVASTATIN CALCIUM 40 MG PO TABS
40.0000 mg | ORAL_TABLET | Freq: Every day | ORAL | Status: DC
  Administered 2021-09-19 – 2021-09-26 (×8): 40 mg via ORAL
  Filled 2021-09-18 (×7): qty 1

## 2021-09-18 MED ORDER — ASPIRIN EC 325 MG PO TBEC
325.0000 mg | DELAYED_RELEASE_TABLET | Freq: Every day | ORAL | Status: DC
  Administered 2021-09-18 – 2021-09-19 (×2): 325 mg via ORAL
  Filled 2021-09-18 (×2): qty 1

## 2021-09-18 MED ORDER — SODIUM CHLORIDE 0.9 % WEIGHT BASED INFUSION
1.0000 mL/kg/h | INTRAVENOUS | Status: AC
  Administered 2021-09-18 (×2): 1 mL/kg/h via INTRAVENOUS

## 2021-09-18 MED ORDER — SERTRALINE HCL 50 MG PO TABS
150.0000 mg | ORAL_TABLET | Freq: Every day | ORAL | Status: DC
  Administered 2021-09-19 – 2021-09-26 (×8): 150 mg via ORAL
  Filled 2021-09-18 (×8): qty 3

## 2021-09-18 MED ORDER — MIDAZOLAM HCL 2 MG/2ML IJ SOLN
INTRAMUSCULAR | Status: AC
  Filled 2021-09-18: qty 2

## 2021-09-18 MED ORDER — CLOPIDOGREL BISULFATE 300 MG PO TABS
ORAL_TABLET | ORAL | Status: AC
  Filled 2021-09-18: qty 1

## 2021-09-18 MED ORDER — CLOPIDOGREL BISULFATE 75 MG PO TABS
75.0000 mg | ORAL_TABLET | Freq: Every day | ORAL | Status: DC
  Administered 2021-09-19 – 2021-09-26 (×8): 75 mg via ORAL
  Filled 2021-09-18 (×8): qty 1

## 2021-09-18 MED ORDER — CARVEDILOL 3.125 MG PO TABS
3.1250 mg | ORAL_TABLET | Freq: Two times a day (BID) | ORAL | Status: DC
  Administered 2021-09-18 – 2021-09-26 (×15): 3.125 mg via ORAL
  Filled 2021-09-18 (×16): qty 1

## 2021-09-18 MED ORDER — FENTANYL CITRATE (PF) 100 MCG/2ML IJ SOLN
INTRAMUSCULAR | Status: DC | PRN
  Administered 2021-09-18: 50 ug via INTRAVENOUS

## 2021-09-18 MED ORDER — VITAMIN B-12 1000 MCG PO TABS
2000.0000 ug | ORAL_TABLET | Freq: Every day | ORAL | Status: DC
  Administered 2021-09-19 – 2021-09-26 (×8): 2000 ug via ORAL
  Filled 2021-09-18 (×8): qty 2

## 2021-09-18 MED ORDER — FENTANYL CITRATE (PF) 100 MCG/2ML IJ SOLN
INTRAMUSCULAR | Status: AC
  Filled 2021-09-18: qty 2

## 2021-09-18 MED ORDER — GABAPENTIN 400 MG PO CAPS
400.0000 mg | ORAL_CAPSULE | Freq: Three times a day (TID) | ORAL | Status: DC
  Administered 2021-09-18 – 2021-09-26 (×23): 400 mg via ORAL
  Filled 2021-09-18 (×22): qty 1

## 2021-09-18 MED ORDER — NICOTINE 21 MG/24HR TD PT24
21.0000 mg | MEDICATED_PATCH | Freq: Every day | TRANSDERMAL | Status: DC
Start: 2021-09-18 — End: 2021-09-26
  Administered 2021-09-19 – 2021-09-26 (×8): 21 mg via TRANSDERMAL
  Filled 2021-09-18 (×8): qty 1

## 2021-09-18 MED ORDER — HEPARIN SODIUM (PORCINE) 1000 UNIT/ML IJ SOLN
INTRAMUSCULAR | Status: DC | PRN
  Administered 2021-09-18: 2000 [IU] via INTRAVENOUS
  Administered 2021-09-18: 10000 [IU] via INTRAVENOUS

## 2021-09-18 MED ORDER — LIVING WELL WITH DIABETES BOOK
Freq: Once | Status: AC
  Filled 2021-09-18: qty 1

## 2021-09-18 MED ORDER — CLOPIDOGREL BISULFATE 75 MG PO TABS: 75.0000 mg | ORAL_TABLET | Freq: Every day | ORAL | Status: DC

## 2021-09-18 MED ORDER — SODIUM CHLORIDE 0.9% FLUSH
3.0000 mL | Freq: Two times a day (BID) | INTRAVENOUS | Status: DC
  Administered 2021-09-19 – 2021-09-26 (×11): 3 mL via INTRAVENOUS

## 2021-09-18 MED ORDER — BUPROPION HCL ER (SR) 150 MG PO TB12
150.0000 mg | ORAL_TABLET | Freq: Two times a day (BID) | ORAL | Status: DC
  Administered 2021-09-18 – 2021-09-26 (×16): 150 mg via ORAL
  Filled 2021-09-18 (×17): qty 1

## 2021-09-18 MED ORDER — HEPARIN (PORCINE) IN NACL 1000-0.9 UT/500ML-% IV SOLN
INTRAVENOUS | Status: AC
  Filled 2021-09-18: qty 1000

## 2021-09-18 MED ORDER — CLOPIDOGREL BISULFATE 300 MG PO TABS
ORAL_TABLET | ORAL | Status: DC | PRN
  Administered 2021-09-18: 300 mg via ORAL

## 2021-09-18 MED ORDER — FOLIC ACID 1 MG PO TABS
1.0000 mg | ORAL_TABLET | Freq: Every day | ORAL | Status: DC
  Administered 2021-09-19 – 2021-09-26 (×8): 1 mg via ORAL
  Filled 2021-09-18 (×8): qty 1

## 2021-09-18 MED ORDER — MIDAZOLAM HCL 2 MG/2ML IJ SOLN
INTRAMUSCULAR | Status: DC | PRN
  Administered 2021-09-18: .5 mg via INTRAVENOUS

## 2021-09-18 MED ORDER — HEPARIN (PORCINE) 25000 UT/250ML-% IV SOLN
2600.0000 [IU]/h | INTRAVENOUS | Status: DC
  Administered 2021-09-19: 19:00:00 2100 [IU]/h via INTRAVENOUS
  Administered 2021-09-19: 1850 [IU]/h via INTRAVENOUS
  Administered 2021-09-20: 18:00:00 2600 [IU]/h via INTRAVENOUS
  Administered 2021-09-20: 07:00:00 2300 [IU]/h via INTRAVENOUS
  Filled 2021-09-18 (×5): qty 250

## 2021-09-18 MED ORDER — LIDOCAINE HCL (PF) 1 % IJ SOLN
INTRAMUSCULAR | Status: DC | PRN
  Administered 2021-09-18: 10 mL

## 2021-09-18 MED ORDER — INSULIN GLARGINE-YFGN 100 UNIT/ML ~~LOC~~ SOLN
50.0000 [IU] | Freq: Every day | SUBCUTANEOUS | Status: DC
  Administered 2021-09-19 – 2021-09-22 (×4): 50 [IU] via SUBCUTANEOUS
  Filled 2021-09-18 (×4): qty 0.5

## 2021-09-18 MED ORDER — SODIUM CHLORIDE 0.9 % IV SOLN: 250.0000 mL | INTRAVENOUS | Status: DC | PRN

## 2021-09-18 MED ORDER — CLOPIDOGREL BISULFATE 75 MG PO TABS: 300.0000 mg | ORAL_TABLET | Freq: Once | ORAL | Status: DC

## 2021-09-18 SURGICAL SUPPLY — 25 items
BALLN COYOTE OTW 3X220X150 (BALLOONS) ×4
BALLN COYOTE OTW 4X60X150 (BALLOONS) ×4
BALLN MUSTANG 8.0X40 75 (BALLOONS) ×3
BALLN MUSTANG 8.0X40 75CM (BALLOONS) ×1
BALLOON COYOTE OTW 3X220X150 (BALLOONS) IMPLANT
BALLOON COYOTE OTW 4X60X150 (BALLOONS) IMPLANT
BALLOON MUSTANG 8.0X40 75 (BALLOONS) IMPLANT
CANISTER PENUMBRA ENGINE (MISCELLANEOUS) ×2 IMPLANT
CATH INDIGO CAT6 KIT (CATHETERS) ×2 IMPLANT
CATH TEMPO AQUA 5F 100CM (CATHETERS) ×2 IMPLANT
CLOSURE PERCLOSE PROSTYLE (VASCULAR PRODUCTS) ×2 IMPLANT
GLIDEWIRE ADV .035X260CM (WIRE) ×2 IMPLANT
KIT ENCORE 26 ADVANTAGE (KITS) ×2 IMPLANT
KIT PV (KITS) ×2 IMPLANT
MAT PREVALON FULL STRYKER (MISCELLANEOUS) ×2 IMPLANT
SHEATH FLEX ANSEL ST 6FR 45CM (SHEATH) ×2 IMPLANT
SHEATH FLEXOR ANSEL 1 7F 45CM (SHEATH) ×2 IMPLANT
STENT VIABAHN 5X100X120 (Permanent Stent) ×2 IMPLANT
STENT VIABAHN VBX 6X39X135 (Permanent Stent) ×2 IMPLANT
TRANSDUCER W/STOPCOCK (MISCELLANEOUS) ×2 IMPLANT
TRAY PV CATH (CUSTOM PROCEDURE TRAY) ×2 IMPLANT
WIRE BENTSON .035X145CM (WIRE) ×2 IMPLANT
WIRE G V18X300CM (WIRE) ×2 IMPLANT
WIRE HI TORQ VERSACORE J 260CM (WIRE) ×2 IMPLANT
WIRE SPARTACORE .014X300CM (WIRE) ×4 IMPLANT

## 2021-09-18 NOTE — Progress Notes (Addendum)
[place] for heparin Indication: LLE ischemia  Allergies  Allergen Reactions   Ciprofloxacin Hcl Hives   Dilaudid [Hydromorphone Hcl] Shortness Of Breath and Other (See Comments)    "Asystole," per pt report   Macrobid [Nitrofurantoin Macrocrystal] Hives, Shortness Of Breath and Rash   Other Hives, Shortness Of Breath and Rash    NO "-CILLINS"!!!   Penicillins Shortness Of Breath    Had had cephalosporins without incident Has patient had a PCN reaction causing immediate rash, facial/tongue/throat swelling, SOB or lightheadedness with hypotension: Yes Has patient had a PCN reaction causing severe rash involving mucus membranes or skin necrosis: Unk Has patient had a PCN reaction that required hospitalization: Unk Has patient had a PCN reaction occurring within the last 10 years: No If all of the above answers are "NO", then may proceed with Cephalosporin use.    Sulfa Antibiotics Hives, Shortness Of Breath and Rash   Lexapro [Escitalopram Oxalate] Other (See Comments)    "I just did not like it."    Patient Measurements: Height: 5\' 7"  (170.2 cm) Weight: 114.3 kg (252 lb) IBW/kg (Calculated) : 61.6 Heparin Dosing Weight: 88.2 kg  Vital Signs: BP: 151/83 (12/16 0730) Pulse Rate: 110 (12/16 1523)  Labs: Recent Labs    09/17/21 0406 09/17/21 0623 09/17/21 1638 09/17/21 2308 09/18/21 0633  HGB 10.6*  --  10.5* 10.2* 10.3*  HCT 33.6*  --  33.7* 33.4* 32.6*  PLT 507*  --  486* 426* 425*  APTT 55*  --   --   --   --   LABPROT 21.0*  --   --   --   --   INR 1.8*  --   --   --   --   HEPARINUNFRC  --   --   --  <0.10* <0.10*  CREATININE 1.67*  --   --   --  1.22*  TROPONINIHS 21* 8  --   --   --      Estimated Creatinine Clearance: 75.2 mL/min (A) (by C-G formula based on SCr of 1.22 mg/dL (H)).   Medical History: Past Medical History:  Diagnosis Date   Anxiety    Depression    Diabetes ([place])    Type 2   GERD  (gastroesophageal reflux disease)    History of blood clots    ,DVT-left leg, and early 2000, had blood clot behind left breast   History of kidney stones    Hypercalcemia    Hyperparathyroidism    Hypertension    had been on Lisinopril and PCP took her off it 4 months ago   Iron deficiency anemia    Left thyroid nodule    Mass of right ovary    Nephrolithiasis    PAOD (peripheral arterial occlusive disease) (HCC)    Peripheral vascular disease (HCC)    Prolonged Q-T interval on ECG 04/19/2019   Renal calculi    Substance abuse (HCC)    Tooth loose    top tooth from the right  is loose     Medications: see MAR  Assessment: 45 yo F with LLE ischemia - no pedal or posterior tibial pulse. Cap refill is > 3 secs. Pt on warfarin PTA for extensive vascular hx. Heparin was initiated and pt underwent peripheral lysis.  Pt s/p lysis recheck with plans to resume IV heparin now. Heparin resumed in cath lab at 1250 units/h - will check 6h heparin level.  Goal of Therapy: Heparin level 0.3-0.7  units/ml Monitor platelets by anticoagulation protocol: Yes   Plan:  Continue heparin 1250 units/h Check 6h heparin level F/U resuming warfarin  ADDENDUM: Initial heparin level remains undetectable, no infusion issues per RN.  Plan: -Increase heparin to 1550 units/h -Recheck heparin level in 8h   Arrie Senate, PharmD, [place], [facility] Clinical Pharmacist [phone] Please check AMION for all [place] numbers 09/18/2021

## 2021-09-18 NOTE — Progress Notes (Signed)
[facility] [place]  Subjective  -states she thinks her left foot may feel a bit better this morning   Objective (!) 151/83 (!) 107 (!) 97.1 F (36.2 C) (Oral) 19 92%  Intake/Output Summary (Last 24 hours) at 09/18/2021 0906 Last data filed at 09/18/2021 0600 Gross per 24 hour  Intake 3354.83 ml  Output 2250 ml  Net 1104.83 ml    Right transfemoral lytic sheath with no hematoma Left foot looks mottled with no signals but remains motor intact  Laboratory Lab Results: Recent Labs    09/17/21 2308 09/18/21 0633  WBC 16.4* 15.8*  HGB 10.2* 10.3*  HCT 33.4* 32.6*  PLT 426* 425*   BMET Recent Labs    09/17/21 0406 09/18/21 0633  NA 130* 133*  K 4.1 3.9  CL 98 103  CO2 25 21*  GLUCOSE 396* 226*  BUN 13 12  CREATININE 1.67* 1.22*  CALCIUM 9.3 8.9    COAG Lab Results  Component Value Date   INR 1.8 (H) 09/17/2021   INR 1.2 (A) 02/17/2021   INR 2.0 (H) 01/06/2021   No results found for: PTT  Assessment/Planning:  Postop day 1 status post initiation of thrombolysis of left lower extremity for acute on chronic limb ischemia.  Aortogram showed left common iliac stent and the remainder of the left iliac artery including the external iliac artery are occluded with thrombus into the common femoral.  SFA and above-knee popliteal artery are patent with occluded below-knee popliteal artery.  She has had multiple thrombotic events in the past and has undergone multiple thrombolysis events.  Continue heparin through the sheath and tPA at 1 mg an hour.  Hemoglobin 10.3 this morning.  Fibrinogen 474.  Keep NPO.  Plan thrombolysis check in the Cath Lab with Dr. Stanford Breed today.  I discussed she is going to be high risk for limb loss.  Marty Heck 09/18/2021 9:06 AM --

## 2021-09-18 NOTE — Progress Notes (Signed)
[place] for heparin Indication: LLE ischemia  Allergies  Allergen Reactions   Ciprofloxacin Hcl Hives   Dilaudid [Hydromorphone Hcl] Shortness Of Breath and Other (See Comments)    "Asystole," per pt report   Macrobid [Nitrofurantoin Macrocrystal] Hives, Shortness Of Breath and Rash   Other Hives, Shortness Of Breath and Rash    NO "-CILLINS"!!!   Penicillins Shortness Of Breath    Had had cephalosporins without incident Has patient had a PCN reaction causing immediate rash, facial/tongue/throat swelling, SOB or lightheadedness with hypotension: Yes Has patient had a PCN reaction causing severe rash involving mucus membranes or skin necrosis: Unk Has patient had a PCN reaction that required hospitalization: Unk Has patient had a PCN reaction occurring within the last 10 years: No If all of the above answers are "NO", then may proceed with Cephalosporin use.    Sulfa Antibiotics Hives, Shortness Of Breath and Rash   Lexapro [Escitalopram Oxalate] Other (See Comments)    "I just did not like it."    Patient Measurements: Height: 5\' 7"  (170.2 cm) Weight: 114.3 kg (252 lb) IBW/kg (Calculated) : 61.6 Heparin Dosing Weight: 88.2 kg  Vital Signs: Temp: 97.1 F (36.2 C) (12/16 0000) Temp Source: Oral (12/16 0000) BP: 151/83 (12/16 0730) Pulse Rate: 107 (12/16 0730)  Labs: Recent Labs    09/17/21 0406 09/17/21 0623 09/17/21 1638 09/17/21 2308 09/18/21 0633  HGB 10.6*  --  10.5* 10.2* 10.3*  HCT 33.6*  --  33.7* 33.4* 32.6*  PLT 507*  --  486* 426* 425*  APTT 55*  --   --   --   --   LABPROT 21.0*  --   --   --   --   INR 1.8*  --   --   --   --   HEPARINUNFRC  --   --   --  <0.10* <0.10*  CREATININE 1.67*  --   --   --  1.22*  TROPONINIHS 21* 8  --   --   --      Estimated Creatinine Clearance: 75.2 mL/min (A) (by C-G formula based on SCr of 1.22 mg/dL (H)).   Medical History: Past Medical History:  Diagnosis Date    Anxiety    Depression    Diabetes ([place])    Type 2   GERD (gastroesophageal reflux disease)    History of blood clots    ,DVT-left leg, and early 2000, had blood clot behind left breast   History of kidney stones    Hypercalcemia    Hyperparathyroidism    Hypertension    had been on Lisinopril and PCP took her off it 4 months ago   Iron deficiency anemia    Left thyroid nodule    Mass of right ovary    Nephrolithiasis    PAOD (peripheral arterial occlusive disease) (HCC)    Peripheral vascular disease (HCC)    Prolonged Q-T interval on ECG 04/19/2019   Renal calculi    Substance abuse (HCC)    Tooth loose    top tooth from the right  is loose     Medications: see MAR  Assessment: 45 yo F with LLE ischemia - no pedal or posterior tibial pulse. Cap refill is > 3 secs. Pt on warfarin PTA for extensive vascular hx. Heparin was initiated at 1250 units/hr prior to procedure  Patient now s/p cath lab with Vascular with lytics started post-procedure, alteplase at 1mg /hr intra-catheter and heparin reduced to  800 units/hr post-procedure per Dr. Carlis Abbott. Plan overnight is to target heparin level 0.2-0.5 per Vascular and return tomorrow for thrombolytics catheter check.  Heparin level came back subtherapeutic (<0.1), on 1050 units/hr. Hgb 10.3, plt 425. Fibrinogen 474. No s/sx of bleeding or infusion issues.   F/u med rec for PTA warfarin regimen to resume afterwards.  Goal of Therapy: Heparin level 0.2-0.5 units/ml while patient on lytics Monitor platelets by anticoagulation protocol: Yes   Plan:  Increase heparin to 1250 units/hr Alteplase IK at 1mg /hr Check 6hr heparin level Plan return to cath labs 12/16  Antonietta Jewel, PharmD, [place] Pharmacist  Phone: [phone] 09/18/2021 8:30 AM  Please check AMION for all [place] phone numbers After 10:00 PM, call [place] [phone]

## 2021-09-18 NOTE — Progress Notes (Signed)
PROGRESS NOTE   Terri Sparks  UYQ:[id number]    DOB: [birth year]    DOA: 09/17/2021  PCP: Ladell Pier, MD   I have briefly reviewed patients previous medical records in [facility].  Chief Complaint  Patient presents with   Leg Pain    Brief Narrative:  45 year old female with medical history significant for chronic hypercoagulable state with multiple arterial thrombosis and embolization, s/p multiple thrombolysis, on chronic Coumadin therapy, HTN, type II DM/IDDM, HLD, anxiety/depression, tobacco use disorder, GERD, presented with worsening left foot pain and discoloration.  Admitted by vascular surgery for acute ischemic left lower extremity for which he underwent left lower extremity angiogram with initiation of thrombolysis.  TRH were consulted for evaluation and management of incidentally positive COVID-19 PCR testing.   Assessment & Plan:  Principal Problem:   Critical lower limb ischemia ([place])   COVID-19 infection, incidental: Her symptoms reportedly started about 7 days prior to admission.  Currently asymptomatic.  Stable without hypoxia on room air.  Inflammatory markers unremarkable.  No specific treatment initiated.  Supportive care as needed.  Airborne and contact isolation.  Type II DM/IDDM, uncontrolled with hyperglycemia: A1c of 10.6 indicates very poor outpatient control.  Diabetes coordinator consulted.  Change diet from regular to diabetic diet.  Continue current resistant SSI.  Resume home Lantus but at reduced dose of 50 units daily.  Monitor closely and consider adding mealtime NovoLog and adjust insulins as needed.  Acute left lower extremity ischemia: S/p angiogram and initiation of thrombolysis on 12/15.  Management per vascular surgery.  Planning to return today for thrombolytics catheter check.  Still has right groin catheter in place.  Defer initiation of aspirin to vascular surgery when able.  Essential hypertension: Reasonably controlled.   Resume amlodipine 5 Mg daily and carvedilol.  Tobacco use disorder: Cessation counseled.  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  Continue to trend daily BMP.  Iron deficiency anemia, chronic anemia: Stable.  Asthma/COPD: Stable.  Body mass index is 39.47 kg/m./Morbid obesity.   DVT prophylaxis:   On IV heparin.   Code Status: Full Code Family Communication: None at bedside. Disposition:  Status is: Inpatient         Consultants:   TRH are consultants.  Procedures:   As noted above  Antimicrobials:   None.   Subjective:  Reports some right lower extremity pain and feeling generally stiff because she is not able to move.  Denies any other complaints.  Specifically denies dyspnea, cough, chest pain, fever.  States that she has taken the first dose of the COVID-vaccine but did not follow-up with second or boosters  Objective:   Vitals:   09/18/21 0600 09/18/21 0630 09/18/21 0700 09/18/21 0730  BP: (!) 166/77 (!) 148/71 (!) 162/79 (!) 151/83  Pulse: (!) 111 (!) 112 (!) 116 (!) 107  Resp: (!) 21 20 15 19   Temp:      TempSrc:      SpO2: 92% 93% 96% 92%  Weight:      Height:        General exam: Young female, moderately built and morbidly obese lying comfortably propped up in bed without distress. Respiratory system: Clear to auscultation. Respiratory effort normal. Cardiovascular system: S1 & S2 heard, RRR. No JVD, murmurs, rubs, gallops or clicks. No pedal edema. Gastrointestinal system: Abdomen is nondistended, soft and nontender. No organomegaly or masses  felt. Normal bowel sounds heard. Central nervous system: Alert and oriented. No focal neurological deficits. Extremities: Symmetric 5 x 5 power.  Right groin with catheter in place.  Right lower leg and splint.  Able to wiggle toes which look with normal coloration.  Left  toes appear dusky.  Left foot slightly cool to touch and difficult to palpate distal pulsations.  Able to wiggle toes and ankle well. Skin: No rashes, lesions or ulcers Psychiatry: Judgement and insight appear normal. Mood & affect appropriate.     Data Reviewed:   I have personally reviewed following labs and imaging studies   CBC: Recent Labs  Lab 09/17/21 0406 09/17/21 1638 09/17/21 2308 09/18/21 0633  WBC 14.5* 15.5* 16.4* 15.8*  NEUTROABS 10.1*  --   --   --   HGB 10.6* 10.5* 10.2* 10.3*  HCT 33.6* 33.7* 33.4* 32.6*  MCV 79.8* 79.9* 80.3 79.3*  PLT 507* 486* 426* 425*    Basic Metabolic Panel: Recent Labs  Lab 09/17/21 0406 09/18/21 0633  NA 130* 133*  K 4.1 3.9  CL 98 103  CO2 25 21*  GLUCOSE 396* 226*  BUN 13 12  CREATININE 1.67* 1.22*  CALCIUM 9.3 8.9    Liver Function Tests: Recent Labs  Lab 09/17/21 0406  AST 12*  ALT 13  ALKPHOS 93  BILITOT 0.2*  PROT 7.3  ALBUMIN 2.9*    CBG: Recent Labs  Lab 09/17/21 1705 09/17/21 2145 09/18/21 0645  GLUCAP 219* 407* 220*    Microbiology Studies:   Recent Results (from the past 240 hour(s))  Resp Panel by RT-PCR (Flu A&B, Covid) Nasopharyngeal Swab     Status: Abnormal   Collection Time: 09/17/21 10:39 AM   Specimen: Nasopharyngeal Swab; Nasopharyngeal(NP) swabs in vial transport medium  Result Value Ref Range Status   SARS Coronavirus 2 by RT PCR POSITIVE (A) NEGATIVE Final    Comment: (NOTE) SARS-CoV-2 target nucleic acids are DETECTED.  The SARS-CoV-2 RNA is generally detectable in upper respiratory specimens during the acute phase of infection. Positive results are indicative of the presence of the identified virus, but do not rule out bacterial infection or co-infection with other pathogens not detected by the test. Clinical correlation with patient history and other diagnostic information is necessary to determine patient infection status. The expected result is Negative.  Fact Sheet  for Patients: EntrepreneurPulse.com.au  Fact Sheet for Healthcare Providers: IncredibleEmployment.be  This test is not yet approved or cleared by the [place] FDA and  has been authorized for detection and/or diagnosis of SARS-CoV-2 by FDA under an Emergency Use Authorization (EUA).  This EUA will remain in effect (meaning this test can be used) for the duration of  the COVID-19 declaration under Section 564(b)(1) of the A ct, 21 U.S.C. section 360bbb-3(b)(1), unless the authorization is terminated or revoked sooner.     Influenza A by PCR NEGATIVE NEGATIVE Final   Influenza B by PCR NEGATIVE NEGATIVE Final    Comment: (NOTE) The Xpert Xpress SARS-CoV-2/FLU/RSV plus assay is intended as an aid in the diagnosis of influenza from Nasopharyngeal swab specimens and should not be used as a sole basis for treatment. Nasal washings and aspirates are unacceptable for Xpert Xpress SARS-CoV-2/FLU/RSV testing.  Fact Sheet for Patients: EntrepreneurPulse.com.au  Fact Sheet for Healthcare Providers: IncredibleEmployment.be  This test is not yet approved or cleared by the [place] FDA and has been authorized for detection and/or diagnosis of SARS-CoV-2 by FDA under an Emergency Use Authorization (EUA). This EUA  will remain in effect (meaning this test can be used) for the duration of the COVID-19 declaration under Section 564(b)(1) of the Act, 21 U.S.C. section 360bbb-3(b)(1), unless the authorization is terminated or revoked.  Performed at [facility], [place] [address] [place], [place] [address]   MRSA Next Gen by PCR, Nasal     Status: None   Collection Time: 09/17/21  5:00 PM   Specimen: Nasal Mucosa; Nasal Swab  Result Value Ref Range Status   MRSA by PCR Next Gen NOT DETECTED NOT DETECTED Final    Comment: (NOTE) The GeneXpert MRSA Assay (FDA approved for NASAL specimens only), is one  component of a comprehensive MRSA colonization surveillance program. It is not intended to diagnose MRSA infection nor to guide or monitor treatment for MRSA infections. Test performance is not FDA approved in patients less than 36 years old. Performed at [facility], [place] [address] [place], [place] [address]     Radiology Studies:  DG Chest 2 View  Result Date: 09/17/2021 CLINICAL DATA:  Cough EXAM: CHEST - 2 VIEW COMPARISON:  12/28/2020 FINDINGS: Normal heart size and mediastinal contours. No acute infiltrate or edema. No effusion or pneumothorax. No acute osseous findings. IMPRESSION: Negative chest. Electronically Signed   By: Jorje Guild M.D.   On: 09/17/2021 04:40   PERIPHERAL VASCULAR CATHETERIZATION  Result Date: 09/17/2021 Patient name: CHERIA SADIQ         MRN: [id number]        DOB: 07-16-1976          Sex: female  09/17/2021 Pre-operative Diagnosis: Acute ischemia left lower extremity Post-operative diagnosis:  Same Surgeon:  Marty Heck, MD Procedure Performed: 1.  Ultrasound-guided access right common femoral artery 2.  Aortogram with catheter selection of aorta 3.  Left lower extremity arteriogram with selection of third order branches 4.  Initiation of left lower extremity thrombolysis  Indications: Patient is a 45 year old female who presented to the ED with 2 to 3-day history of left lower extremity pain.  She was found to have no femoral pulse and no signals in the foot.  She is motor intact.  She has had previous common iliac stent with previous left lower extremity thrombectomy by Dr. Donzetta Matters and multiple additional thrombolysis events for limb salvage of the left lower extremity.  She is taking Coumadin although subtherapeutic.  She presents after risk benefits discussed.  Findings:  Aortogram showed that the left common and external iliac artery are occluded.  She does reconstitute some of the common femoral but this appears to be full of acute thrombus.   The SFA and above-knee popliteal artery are patent.  There is some thrombus in the proximal profunda.  The below-knee popliteal artery is occluded.  She reconstitutes a small island of peroneal.  Thrombolysis catheter was placed from the proximal left common iliac into the distal SFA above-knee popliteal artery for initiation of thrombolysis.             Procedure:  The patient was identified in the holding area and taken to room 8.  The patient was then placed supine on the table and prepped and draped in the usual sterile fashion.  A time out was called.  Ultrasound was used to evaluate the right common femoral artery.  It was patent .  A digital ultrasound image was acquired.  A micropuncture needle was used to access the right common femoral artery under ultrasound guidance.  An 018 wire was  advanced without resistance and a micropuncture sheath was placed.  The 018 wire was removed and a benson wire was placed.  The micropuncture sheath was exchanged for a 5 french sheath.  An omniflush catheter was advanced over the wire to the level of L-1.  An abdominal angiogram was obtained.  Pertinent findings are noted above but the left common and external iliac artery were occluded.  I then used a Glidewire advantage to cross the occluded segment of left iliac artery and got my wire into the left SFA and then got some staged images of the left lower extremity with pertinent findings noted above.  Ultimately I placed a 6 French Ansell sheath in the right groin up to the aortic bifurcation.  I then advanced the UniFuse catheter from the takeoff of the left iliac artery all the way down to the distal SFA above-knee popliteal artery over my glidewire advantage.  This was the longest infusion catheter we had.  We will initiate tPA at 1 mg an hour down the unifuse catheter and heparin at 800 units an hour through the sheath.  She tolerated the procedure without any complications.   Plan: Patient will be taken to the ICU for  initiation of thrombolysis.  She will return tomorrow for thrombolytics catheter check.  Marty Heck, MD [facility] Office: [phone]     Scheduled Meds:    Chlorhexidine Gluconate Cloth  6 each Topical Daily   insulin aspart  0-20 Units Subcutaneous TID WC   insulin aspart  0-5 Units Subcutaneous QHS   pantoprazole  40 mg Oral Daily   sodium chloride flush  3 mL Intravenous Q12H    Continuous Infusions:    sodium chloride 100 mL/hr at 09/18/21 0813   sodium chloride     alteplase (LIMB ISCHEMIA) 10 mg in normal saline (0.02 mg/mL) infusion 1 mg/hr (09/18/21 0200)   heparin 1,050 Units/hr (09/18/21 0600)     LOS: 1 day     Vernell Leep, MD,  FACP, [facility], [facility], [facility] (Care Management Physician Certified) [place]  To contact the attending provider between 7A-7P or the covering provider during after hours 7P-7A, please log into the web site [email or web address] and access using universal [place] password for that web site. If you do not have the password, please call the hospital operator.  09/18/2021, 9:01 AM

## 2021-09-18 NOTE — Op Note (Signed)
DATE OF SERVICE: 09/18/2021  PATIENT:  Terri Sparks  45 y.o. female  PRE-OPERATIVE DIAGNOSIS:  Thrombosis of left lower extremity causing acute limb ischemia, ongoing thrombolysis  POST-OPERATIVE DIAGNOSIS:  Same  PROCEDURE:   1) Left lower extremity angiogram with third order cannulation (150mL total contrast) 2) Conscious sedation (95 minutes) 3) Left common iliac artery stenting (6x67mm VBX) 4) Left profunda femoris mechanical thrombectomy (Penumbra) 5) Left popliteal mechanical thrombectomy (Penumbra) 6) Left peroneal mechanical thrombectomy (Penumbra) 7) Left popliteal angioplasty and stenting (5x112mm viabahn, post dilated to 22mm) 8) Left peroneal angioplasty (3x246mm [place])  SURGEON:  [name]. Stanford Breed, MD  ASSISTANT: none  ANESTHESIA:   local and IV sedation  ESTIMATED BLOOD LOSS: minimal  LOCAL MEDICATIONS USED:  LIDOCAINE   COUNTS: confirmed correct.  PATIENT DISPOSITION:  PACU - hemodynamically stable.   Delay start of Pharmacological VTE agent (>24hrs) due to surgical blood loss or risk of bleeding: no  INDICATION FOR PROCEDURE: Terri Sparks is a 45 y.o. female with left lower extremity acute limb ischemia with ongoing thrombolysis. After careful discussion of risks, benefits, and alternatives the patient was offered lysis recheck. The patient understood and wished to proceed.  OPERATIVE FINDINGS:  A small flow channel was created from thrombolysis overnight into the left thigh.  The previously placed left common iliac stent had thrombus associated with it causing significant stenosis into the left lower extremity.  This resolved with relining with a 6 x 39 mm VBX.  Thrombus embolized to her le after better flow was established into the left lower extremity, I was able to see a large burden of thrombus in the profunda femoris artery limiting flow into the thigh.  This was treated with mechanical thrombectomy with the penumbra device.  The thrombus was  cleared, but no improvement of flow was noted.  The popliteal artery occluded just above the knee joint.  I was able to pass a wire into the peroneal artery without resistance to flow.  I then performed mechanical thrombectomy with the penumbra device from the ankle to the knee.  A flow channel was created, but significant chronic thrombus remained about the knee.  I attempted to treat the chronic thrombus with angioplasty to try to mold this to the wall of the vessel.  This worsen the problem.  I brought a self-expanding covered stent (Viabahn 5 x 100 mm) across the lesion and deployed it without difficulty.  This resolved the popliteal disease.  Flow was noted through the tibioperoneal trunk into the proximal PT and peroneal artery.  Both occluded mid calf.  There was virtually no outflow in the foot.  I was concerned that she had showered embolus into her pedal vessels.  Another attempted angioplasty into the terminal peroneal artery was not effective at improving flow into the foot.  A posterior tibial artery signal was noted at the foot at this point.  We ended the case here.  DESCRIPTION OF PROCEDURE: After identification of the patient in the pre-operative holding area, the patient was transferred to the operating room. The patient was positioned supine on the operating room table. Anesthesia was induced. The groins was prepped and draped in standard fashion. A surgical pause was performed confirming correct patient, procedure, and operative location.  The patient was systemically heparinized with 10,000 units of heparin.  Heparin was redosed at 1 hour.  The previously placed sheath in the right groin was upsized to 7 [place] by 45 cm.  Diagnostic angiography was performed in the  left lower extremity.  See above for details.  First the common iliac artery stent relining was performed with a 6 x 39 mm VBX balloon expandable stent.  Good technical result was achieved.  Next mechanical thrombectomy  of the left profunda femoris artery was performed with the penumbra device.  Good technical result was achieved, but flow was not improved through this artery.  Next mechanical thrombectomy was performed from the terminal left peroneal artery to the popliteal artery occlusion using a CAT 6 device.  Fair technical result was achieved.  A flow channel was established.  A popliteal artery was then angioplastied.  This worsened thrombus associated in the popliteal artery.  I elected to place a 5 x 100 mm Viabahn stent across the knee joint.  This was postdilated with a 4 mm balloon angioplasty.  Good technical result was noted.  Flow was established in the tibioperoneal trunk.  Next, angiogram was performed and revealed occlusion of the posterior tibial and peroneal arteries in the mid calf.  I was able to navigate a 014 wire into the terminal peroneal artery and perform angioplasty from the ankle to our previous intervention.  This did not augment flow into the foot.  At this point I had no further options for intervention for this unfortunate woman.  We ended the case here.  A Perclose device was used to close the right common femoral arteriotomy.  Good hemostasis was achieved.  Conscious sedation was administered with the use of IV fentanyl and midazolam under continuous physician and nurse monitoring.  Heart rate, blood pressure, and oxygen saturation were continuously monitored.  Total sedation time was 95 minutes  Upon completion of the case instrument and sharps counts were confirmed correct. The patient was transferred to the  PACU in good condition. I was present for all portions of the procedure.  PLAN: Patient needs maximal anticoagulation therapy: Aspirin, Plavix, Coumadin indefinitely.  We will transfer back to ICU.  Okay for stepdown unit.  We will have to monitor clinical exam.  High risk for below-knee amputation.  [name]. Stanford Breed, MD [facility]  Phone Number: [phone] 09/18/2021 2:26 PM

## 2021-09-19 DIAGNOSIS — E1165 Type 2 diabetes mellitus with hyperglycemia: Secondary | ICD-10-CM | POA: Diagnosis not present

## 2021-09-19 DIAGNOSIS — I70229 Atherosclerosis of native arteries of extremities with rest pain, unspecified extremity: Secondary | ICD-10-CM | POA: Diagnosis not present

## 2021-09-19 LAB — FIBRINOGEN: Fibrinogen: 661 mg/dL — ABNORMAL HIGH (ref 210–475)

## 2021-09-19 LAB — BASIC METABOLIC PANEL
Anion gap: 8 (ref 5–15)
BUN: 11 mg/dL (ref 6–20)
CO2: 21 mmol/L — ABNORMAL LOW (ref 22–32)
Calcium: 8.9 mg/dL (ref 8.9–10.3)
Chloride: 101 mmol/L (ref 98–111)
Creatinine, Ser: 1.35 mg/dL — ABNORMAL HIGH (ref 0.44–1.00)
GFR, Estimated: 49 mL/min — ABNORMAL LOW (ref >60)
Glucose, Bld: 289 mg/dL — ABNORMAL HIGH (ref 70–99)
Potassium: 4.4 mmol/L (ref 3.5–5.1)
Sodium: 130 mmol/L — ABNORMAL LOW (ref 135–145)

## 2021-09-19 LAB — CBC
HCT: 29.9 % — ABNORMAL LOW (ref 36.0–46.0)
Hemoglobin: 9.4 g/dL — ABNORMAL LOW (ref 12.0–15.0)
MCH: 24.7 pg — ABNORMAL LOW (ref 26.0–34.0)
MCHC: 31.4 g/dL (ref 30.0–36.0)
MCV: 78.7 fL — ABNORMAL LOW (ref 80.0–100.0)
Platelets: 393 10*3/uL (ref 150–400)
RBC: 3.8 MIL/uL — ABNORMAL LOW (ref 3.87–5.11)
RDW: 16.9 % — ABNORMAL HIGH (ref 11.5–15.5)
WBC: 16.5 10*3/uL — ABNORMAL HIGH (ref 4.0–10.5)
nRBC: 0 % (ref 0.0–0.2)

## 2021-09-19 LAB — FERRITIN: Ferritin: 22 ng/mL (ref 11–307)

## 2021-09-19 LAB — GLUCOSE, CAPILLARY
Glucose-Capillary: 298 mg/dL — ABNORMAL HIGH (ref 70–99)
Glucose-Capillary: 172 mg/dL — ABNORMAL HIGH (ref 70–99)
Glucose-Capillary: 154 mg/dL — ABNORMAL HIGH (ref 70–99)
Glucose-Capillary: 177 mg/dL — ABNORMAL HIGH (ref 70–99)

## 2021-09-19 LAB — PROTIME-INR
INR: 1.9 — ABNORMAL HIGH (ref 0.8–1.2)
Prothrombin Time: 22.2 seconds — ABNORMAL HIGH (ref 11.4–15.2)

## 2021-09-19 LAB — C-REACTIVE PROTEIN: CRP: 19 mg/dL — ABNORMAL HIGH (ref <1.0)

## 2021-09-19 LAB — HEPARIN LEVEL (UNFRACTIONATED)
Heparin Unfractionated: <0.1 IU/mL — ABNORMAL LOW (ref 0.30–0.70)
Heparin Unfractionated: 0.11 IU/mL — ABNORMAL LOW (ref 0.30–0.70)
Heparin Unfractionated: 0.24 IU/mL — ABNORMAL LOW (ref 0.30–0.70)

## 2021-09-19 LAB — D-DIMER, QUANTITATIVE: D-Dimer, Quant: >20 ug/mL-FEU — ABNORMAL HIGH (ref 0.00–0.50)

## 2021-09-19 MED ORDER — SODIUM CHLORIDE 0.9 % IV SOLN: INTRAVENOUS | Status: DC

## 2021-09-19 MED ORDER — WARFARIN SODIUM 10 MG PO TABS
10.0000 mg | ORAL_TABLET | Freq: Once | ORAL | Status: AC
  Administered 2021-09-19: 10 mg via ORAL
  Filled 2021-09-19: qty 1

## 2021-09-19 MED ORDER — WARFARIN - PHARMACIST DOSING INPATIENT: Freq: Every day | Status: DC

## 2021-09-19 MED ORDER — INSULIN ASPART 100 UNIT/ML IJ SOLN
3.0000 [IU] | Freq: Three times a day (TID) | INTRAMUSCULAR | Status: DC
Start: 2021-09-19 — End: 2021-09-22
  Administered 2021-09-19 – 2021-09-20 (×5): 3 [IU] via SUBCUTANEOUS

## 2021-09-19 NOTE — Progress Notes (Signed)
[place] for heparin Indication: LLE ischemia  Allergies  Allergen Reactions   Ciprofloxacin Hcl Hives   Dilaudid [Hydromorphone Hcl] Shortness Of Breath and Other (See Comments)    "Asystole," per pt report   Macrobid [Nitrofurantoin Macrocrystal] Hives, Shortness Of Breath and Rash   Other Hives, Shortness Of Breath and Rash    NO "-CILLINS"!!!   Penicillins Shortness Of Breath    Had had cephalosporins without incident Has patient had a PCN reaction causing immediate rash, facial/tongue/throat swelling, SOB or lightheadedness with hypotension: Yes Has patient had a PCN reaction causing severe rash involving mucus membranes or skin necrosis: Unk Has patient had a PCN reaction that required hospitalization: Unk Has patient had a PCN reaction occurring within the last 10 years: No If all of the above answers are "NO", then may proceed with Cephalosporin use.    Sulfa Antibiotics Hives, Shortness Of Breath and Rash   Lexapro [Escitalopram Oxalate] Other (See Comments)    "I just did not like it."    Patient Measurements: Height: 5\' 7"  (170.2 cm) Weight: 114.3 kg (252 lb) IBW/kg (Calculated) : 61.6 Heparin Dosing Weight: 88.2 kg  Vital Signs: Temp: 98.2 F (36.8 C) (12/17 0000) Temp Source: Oral (12/17 0000) BP: 147/87 (12/17 0130) Pulse Rate: 106 (12/17 0130)  Labs: Recent Labs    09/17/21 0406 09/17/21 0623 09/17/21 1638 09/18/21 0633 09/18/21 1550 09/18/21 2011 09/19/21 0113  HGB 10.6*  --    < > 10.3* 10.5* 9.0* 9.4*  HCT 33.6*  --    < > 32.6* 34.6* 29.7* 29.9*  PLT 507*  --    < > 425* 437* 416* 393  APTT 55*  --   --   --   --   --   --   LABPROT 21.0*  --   --   --   --   --  22.2*  INR 1.8*  --   --   --   --   --  1.9*  HEPARINUNFRC  --   --    < > <0.10*  --  <0.10* <0.10*  CREATININE 1.67*  --   --  1.22*  --   --  1.35*  TROPONINIHS 21* 8  --   --   --   --   --    < > = values in this interval not displayed.      Estimated Creatinine Clearance: 68 mL/min (A) (by C-G formula based on SCr of 1.35 mg/dL (H)).   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 Cap refill is > 3 secs. Pt on warfarin PTA for extensive vascular hx. Heparin was initiated and pt underwent peripheral lysis.  Pt s/p lysis recheck with plans to resume IV heparin now. Heparin resumed in cath lab at 1250 units/h - will check 6h heparin level.  12/17 AM update:  Heparin level is low  Pt has required higher doses of heparin in the past INR 1.9  Goal of Therapy: Heparin level 0.3-0.7 units/ml Monitor platelets by anticoagulation protocol: Yes   Plan:  Inc heparin to 1850 units/hr 1200 heparin level  Narda Bonds, PharmD, BCPS Clinical Pharmacist Phone: [phone]

## 2021-09-19 NOTE — Progress Notes (Signed)
PROGRESS NOTE   Terri Sparks  WER:[id number]    DOB: [birth year]    DOA: 09/17/2021  PCP: Ladell Pier, MD   I have briefly reviewed patients previous medical records in [facility].  Chief Complaint  Patient presents with   Leg Pain    Brief Narrative:  45 year old female with medical history significant for chronic hypercoagulable state with multiple arterial thrombosis and embolization, s/p multiple thrombolysis, on chronic Coumadin therapy, HTN, type II DM/IDDM, HLD, anxiety/depression, tobacco use disorder, GERD, presented with worsening left foot pain and discoloration.  Admitted by vascular surgery for acute ischemic left lower extremity for which he underwent left lower extremity angiogram with initiation of thrombolysis.  TRH were consulted for evaluation and management of incidentally positive COVID-19 PCR testing.   Assessment & Plan:  Principal Problem:   Critical lower limb ischemia ([place])   COVID-19 infection, incidental: Her symptoms reportedly started about 7 days prior to admission.  Currently asymptomatic.  Stable without hypoxia on room air.  Inflammatory markers elevation likely related to leg ischemia and intervention rather than the COVID infection.  No specific treatment initiated.  Supportive care as needed.  Airborne and contact isolation.  Type II DM/IDDM, uncontrolled with hyperglycemia: A1c of 10.6 indicates very poor outpatient control.  Diabetes coordinator consulted.  Changed diet from regular to diabetic diet.  Continue current resistant SSI.  Resumed home Lantus but at reduced dose of 50 units daily but was not given yesterday (Lantus does not need to be held if patient is transiently n.p.o. for procedures).  Monitor closely and consider adding mealtime NovoLog and adjust insulins as needed.  CBGs as high as 380 last night.  Adjust insulins as needed.  Pseudohyponatremia due to hyperglycemia.  Acute left lower extremity ischemia: S/p angiogram  and initiation of thrombolysis on 12/15.  Management per vascular surgery.  Back to the OR 12/16, extensive thrombectomy, stenting, angioplasty of left lower extremity multiple vessels.  BVS follow-up appreciated, improved, no rest pain, high risk of limb loss over the next year, resuming triple therapy with warfarin, aspirin and Plavix this evening.  Currently on IV heparin.  Essential hypertension: Reasonably controlled.  Resumed amlodipine 5 Mg daily and carvedilol.  Tobacco use disorder: Cessation counseled.  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  Creatinine back up from 1.22-1.35 likely related to contrast during procedure yesterday.  Continue to trend daily BMP.  Iron deficiency anemia, chronic anemia: Stable.  Asthma/COPD: Stable.  Homelessness: Patient reports that she was living with her fianc prior to admission but now has nowhere to go upon discharge.  TOC consulted  Body mass index is 39.47 kg/m./Morbid obesity.   DVT prophylaxis:   On IV heparin.   Code Status: Full Code Family Communication: None at bedside. Disposition:  Status is: Inpatient         Consultants:   TRH are consultants.  Procedures:   As noted above  Antimicrobials:   None.   Subjective:  Left leg rest pain resolved.  Still has some intermittent pain, improves with pain meds.  No respiratory symptoms reported.  Anxious and tearful stating that she has no place to go after she is discharged.  Objective:   Vitals:   09/19/21 0600 09/19/21 0800 09/19/21 0900 09/19/21 1000  BP: (!) 93/55 106/62 (!) 110/92   Pulse: 96 92 98 96  Resp: 13 18 17  14  Temp:      TempSrc:      SpO2: 92% 100% 99% 100%  Weight:      Height:        General exam: Young female, moderately built and morbidly obese lying comfortably propped up in bed without  distress. Respiratory system: Clear to auscultation.  No increased work of breathing. Cardiovascular system: S1 and S2 heard, RRR.  No JVD, murmurs or pedal edema. Gastrointestinal system: Abdomen is nondistended, soft and nontender. No organomegaly or masses felt. Normal bowel sounds heard. Central nervous system: Alert and oriented. No focal neurological deficits. Extremities: Symmetric 5 x 5 power.  Right lower extremity unremarkable.  Left lower extremity with improvement, left foot not as cool to touch as yesterday prior to procedure,?  Feeble dorsalis pedis felt and no duskiness of her toes which she had yesterday Skin: No rashes, lesions or ulcers Psychiatry: Judgement and insight appear normal. Mood & affect appropriate.     Data Reviewed:   I have personally reviewed following labs and imaging studies   CBC: Recent Labs  Lab 09/17/21 0406 09/17/21 1638 09/18/21 1550 09/18/21 2011 09/19/21 0113  WBC 14.5*   < > 20.2* 18.1* 16.5*  NEUTROABS 10.1*  --   --   --   --   HGB 10.6*   < > 10.5* 9.0* 9.4*  HCT 33.6*   < > 34.6* 29.7* 29.9*  MCV 79.8*   < > 80.7 80.5 78.7*  PLT 507*   < > 437* 416* 393   < > = values in this interval not displayed.    Basic Metabolic Panel: Recent Labs  Lab 09/17/21 0406 09/18/21 0633 09/19/21 0113  NA 130* 133* 130*  K 4.1 3.9 4.4  CL 98 103 101  CO2 25 21* 21*  GLUCOSE 396* 226* 289*  BUN 13 12 11   CREATININE 1.67* 1.22* 1.35*  CALCIUM 9.3 8.9 8.9    Liver Function Tests: Recent Labs  Lab 09/17/21 0406  AST 12*  ALT 13  ALKPHOS 93  BILITOT 0.2*  PROT 7.3  ALBUMIN 2.9*    CBG: Recent Labs  Lab 09/18/21 1540 09/18/21 2116 09/19/21 0631  GLUCAP 244* 380* 298*    Microbiology Studies:   Recent Results (from the past 240 hour(s))  Resp Panel by RT-PCR (Flu A&B, Covid) Nasopharyngeal Swab     Status: Abnormal   Collection Time: 09/17/21 10:39 AM   Specimen: Nasopharyngeal Swab; Nasopharyngeal(NP) swabs in vial  transport medium  Result Value Ref Range Status   SARS Coronavirus 2 by RT PCR POSITIVE (A) NEGATIVE Final    Comment: (NOTE) SARS-CoV-2 target nucleic acids are DETECTED.  The SARS-CoV-2 RNA is generally detectable in upper respiratory specimens during the acute phase of infection. Positive results are indicative of the presence of the identified virus, but do not rule out bacterial infection or co-infection with other pathogens not detected by the test. Clinical correlation with patient history and other diagnostic information is necessary to determine patient infection status. The expected result is Negative.  Fact Sheet for Patients: EntrepreneurPulse.com.au  Fact Sheet for Healthcare Providers: IncredibleEmployment.be  This test is not yet approved or cleared by the [place] FDA and  has been authorized for detection and/or diagnosis of SARS-CoV-2 by FDA under an Emergency Use Authorization (EUA).  This EUA will remain in effect (meaning this test can be used) for the duration of  the COVID-19 declaration under Section 564(b)(1) of the A ct, 21 U.S.C. section 360bbb-3(b)(1), unless  the authorization is terminated or revoked sooner.     Influenza A by PCR NEGATIVE NEGATIVE Final   Influenza B by PCR NEGATIVE NEGATIVE Final    Comment: (NOTE) The Xpert Xpress SARS-CoV-2/FLU/RSV plus assay is intended as an aid in the diagnosis of influenza from Nasopharyngeal swab specimens and should not be used as a sole basis for treatment. Nasal washings and aspirates are unacceptable for Xpert Xpress SARS-CoV-2/FLU/RSV testing.  Fact Sheet for Patients: EntrepreneurPulse.com.au  Fact Sheet for Healthcare Providers: IncredibleEmployment.be  This test is not yet approved or cleared by the [place] FDA and has been authorized for detection and/or diagnosis of SARS-CoV-2 by FDA under an Emergency Use  Authorization (EUA). This EUA will remain in effect (meaning this test can be used) for the duration of the COVID-19 declaration under Section 564(b)(1) of the Act, 21 U.S.C. section 360bbb-3(b)(1), unless the authorization is terminated or revoked.  Performed at [facility], [place] [address], [place], [place] [address]   MRSA Next Gen by PCR, Nasal     Status: None   Collection Time: 09/17/21  5:00 PM   Specimen: Nasal Mucosa; Nasal Swab  Result Value Ref Range Status   MRSA by PCR Next Gen NOT DETECTED NOT DETECTED Final    Comment: (NOTE) The GeneXpert MRSA Assay (FDA approved for NASAL specimens only), is one component of a comprehensive MRSA colonization surveillance program. It is not intended to diagnose MRSA infection nor to guide or monitor treatment for MRSA infections. Test performance is not FDA approved in patients less than 3 years old. Performed at [facility], [place] [address], [place], [place] [address]     Radiology Studies:  PERIPHERAL VASCULAR CATHETERIZATION  Result Date: 09/18/2021 DATE OF SERVICE: 09/18/2021  PATIENT:  Terri Sparks  45 y.o. female  PRE-OPERATIVE DIAGNOSIS:  Thrombosis of left lower extremity causing acute limb ischemia, ongoing thrombolysis  POST-OPERATIVE DIAGNOSIS:  Same  PROCEDURE:  1) Left lower extremity angiogram with third order cannulation (18mL total contrast) 2) Conscious sedation (95 minutes) 3) Left common iliac artery stenting (6x21mm VBX) 4) Left profunda femoris mechanical thrombectomy (Penumbra) 5) Left popliteal mechanical thrombectomy (Penumbra) 6) Left peroneal mechanical thrombectomy (Penumbra) 7) Left popliteal angioplasty and stenting (5x139mm viabahn, post dilated to 63mm) 8) Left peroneal angioplasty (3x223mm [place])  SURGEON:  [name]. Stanford Breed, MD  ASSISTANT: none  ANESTHESIA:   local and IV sedation  ESTIMATED BLOOD LOSS: minimal  LOCAL MEDICATIONS USED:  LIDOCAINE  COUNTS: confirmed correct.  PATIENT  DISPOSITION:  PACU - hemodynamically stable.  Delay start of Pharmacological VTE agent (>24hrs) due to surgical blood loss or risk of bleeding: no  INDICATION FOR PROCEDURE: Terri Sparks is a 45 y.o. female with left lower extremity acute limb ischemia with ongoing thrombolysis. After careful discussion of risks, benefits, and alternatives the patient was offered lysis recheck. The patient understood and wished to proceed.  OPERATIVE FINDINGS: A small flow channel was created from thrombolysis overnight into the left thigh. The previously placed left common iliac stent had thrombus associated with it causing significant stenosis into the left lower extremity.  This resolved with relining with a 6 x 39 mm VBX. Thrombus embolized to her le after better flow was established into the left lower extremity, I was able to see a large burden of thrombus in the profunda femoris artery limiting flow into the thigh.  This was treated with mechanical thrombectomy with the penumbra device.  The thrombus was cleared,  but no improvement of flow was noted. The popliteal artery occluded just above the knee joint.  I was able to pass a wire into the peroneal artery without resistance to flow.  I then performed mechanical thrombectomy with the penumbra device from the ankle to the knee.  A flow channel was created, but significant chronic thrombus remained about the knee.  I attempted to treat the chronic thrombus with angioplasty to try to mold this to the wall of the vessel.  This worsen the problem.  I brought a self-expanding covered stent (Viabahn 5 x 100 mm) across the lesion and deployed it without difficulty.  This resolved the popliteal disease.  Flow was noted through the tibioperoneal trunk into the proximal PT and peroneal artery.  Both occluded mid calf.  There was virtually no outflow in the foot.  I was concerned that she had showered embolus into her pedal vessels.  Another attempted angioplasty into the terminal  peroneal artery was not effective at improving flow into the foot.  A posterior tibial artery signal was noted at the foot at this point.  We ended the case here.  DESCRIPTION OF PROCEDURE: After identification of the patient in the pre-operative holding area, the patient was transferred to the operating room. The patient was positioned supine on the operating room table. Anesthesia was induced. The groins was prepped and draped in standard fashion. A surgical pause was performed confirming correct patient, procedure, and operative location.  The patient was systemically heparinized with 10,000 units of heparin.  Heparin was redosed at 1 hour.  The previously placed sheath in the right groin was upsized to 7 [place] by 45 cm.  Diagnostic angiography was performed in the left lower extremity.  See above for details.  First the common iliac artery stent relining was performed with a 6 x 39 mm VBX balloon expandable stent.  Good technical result was achieved. Next mechanical thrombectomy of the left profunda femoris artery was performed with the penumbra device.  Good technical result was achieved, but flow was not improved through this artery. Next mechanical thrombectomy was performed from the terminal left peroneal artery to the popliteal artery occlusion using a CAT 6 device.  Fair technical result was achieved.  A flow channel was established. A popliteal artery was then angioplastied.  This worsened thrombus associated in the popliteal artery.  I elected to place a 5 x 100 mm Viabahn stent across the knee joint.  This was postdilated with a 4 mm balloon angioplasty.  Good technical result was noted.  Flow was established in the tibioperoneal trunk. Next, angiogram was performed and revealed occlusion of the posterior tibial and peroneal arteries in the mid calf.  I was able to navigate a 014 wire into the terminal peroneal artery and perform angioplasty from the ankle to our previous intervention.  This did not  augment flow into the foot. At this point I had no further options for intervention for this unfortunate woman.  We ended the case here.  A Perclose device was used to close the right common femoral arteriotomy.  Good hemostasis was achieved.  Conscious sedation was administered with the use of IV fentanyl and midazolam under continuous physician and nurse monitoring.  Heart rate, blood pressure, and oxygen saturation were continuously monitored.  Total sedation time was 95 minutes  Upon completion of the case instrument and sharps counts were confirmed correct. The patient was transferred to the  PACU in good condition. I was present for all  portions of the procedure.  PLAN: Patient needs maximal anticoagulation therapy: Aspirin, Plavix, Coumadin indefinitely.  We will transfer back to ICU.  Okay for stepdown unit.  We will have to monitor clinical exam.  High risk for below-knee amputation.  [name]. Stanford Breed, MD [facility] Phone Number: [phone] 09/18/2021 2:26 PM   PERIPHERAL VASCULAR CATHETERIZATION  Result Date: 09/17/2021 Patient name: Terri Sparks         MRN: [id number]        DOB: Feb 12, 1976          Sex: female  09/17/2021 Pre-operative Diagnosis: Acute ischemia left lower extremity Post-operative diagnosis:  Same Surgeon:  Marty Heck, MD Procedure Performed: 1.  Ultrasound-guided access right common femoral artery 2.  Aortogram with catheter selection of aorta 3.  Left lower extremity arteriogram with selection of third order branches 4.  Initiation of left lower extremity thrombolysis  Indications: Patient is a 45 year old female who presented to the ED with 2 to 3-day history of left lower extremity pain.  She was found to have no femoral pulse and no signals in the foot.  She is motor intact.  She has had previous common iliac stent with previous left lower extremity thrombectomy by Dr. Donzetta Matters and multiple additional thrombolysis events for  limb salvage of the left lower extremity.  She is taking Coumadin although subtherapeutic.  She presents after risk benefits discussed.  Findings:  Aortogram showed that the left common and external iliac artery are occluded.  She does reconstitute some of the common femoral but this appears to be full of acute thrombus.  The SFA and above-knee popliteal artery are patent.  There is some thrombus in the proximal profunda.  The below-knee popliteal artery is occluded.  She reconstitutes a small island of peroneal.  Thrombolysis catheter was placed from the proximal left common iliac into the distal SFA above-knee popliteal artery for initiation of thrombolysis.             Procedure:  The patient was identified in the holding area and taken to room 8.  The patient was then placed supine on the table and prepped and draped in the usual sterile fashion.  A time out was called.  Ultrasound was used to evaluate the right common femoral artery.  It was patent .  A digital ultrasound image was acquired.  A micropuncture needle was used to access the right common femoral artery under ultrasound guidance.  An 018 wire was advanced without resistance and a micropuncture sheath was placed.  The 018 wire was removed and a benson wire was placed.  The micropuncture sheath was exchanged for a 5 french sheath.  An omniflush catheter was advanced over the wire to the level of L-1.  An abdominal angiogram was obtained.  Pertinent findings are noted above but the left common and external iliac artery were occluded.  I then used a Glidewire advantage to cross the occluded segment of left iliac artery and got my wire into the left SFA and then got some staged images of the left lower extremity with pertinent findings noted above.  Ultimately I placed a 6 French Ansell sheath in the right groin up to the aortic bifurcation.  I then advanced the UniFuse catheter from the takeoff of the left iliac artery all the way down to the distal SFA  above-knee popliteal artery over my glidewire advantage.  This was the longest infusion catheter we had.  We  will initiate tPA at 1 mg an hour down the unifuse catheter and heparin at 800 units an hour through the sheath.  She tolerated the procedure without any complications.   Plan: Patient will be taken to the ICU for initiation of thrombolysis.  She will return tomorrow for thrombolytics catheter check.  Marty Heck, MD [facility] [place] Office: [phone]     Scheduled Meds:    amLODipine  5 mg Oral Daily   aspirin EC  325 mg Oral Daily   atorvastatin  40 mg Oral Daily   buPROPion  150 mg Oral BID   carvedilol  3.125 mg Oral BID   Chlorhexidine Gluconate Cloth  6 each Topical Daily   clopidogrel  75 mg Oral Daily   folic acid  1 mg Oral Daily   gabapentin  400 mg Oral TID   insulin aspart  0-20 Units Subcutaneous TID WC   insulin aspart  0-5 Units Subcutaneous QHS   insulin glargine-yfgn  50 Units Subcutaneous Daily   nicotine  21 mg Transdermal Daily   olopatadine  1 drop Both Eyes BID   pantoprazole  40 mg Oral Daily   sertraline  150 mg Oral Daily   sodium chloride flush  3 mL Intravenous Q12H   sodium chloride flush  3 mL Intravenous Q12H   vitamin B-12  2,000 mcg Oral Daily    Continuous Infusions:    sodium chloride 100 mL/hr at 09/18/21 1100   sodium chloride     sodium chloride     heparin 1,850 Units/hr (09/19/21 0900)     LOS: 2 days     Vernell Leep, MD,  FACP, [facility], [facility], [facility] (Care Management Physician Certified) [place]  To contact the attending provider between 7A-7P or the covering provider during after hours 7P-7A, please log into the web site [email or web address] and access using universal [place] password for that web site. If you do not have the password, please call the hospital operator.  09/19/2021, 10:54 AM

## 2021-09-19 NOTE — Progress Notes (Signed)
[facility] [place]  Subjective  - Foot no longer in rest pain    Objective (!) 93/55 96 98.1 F (36.7 C) 13 92%  Intake/Output Summary (Last 24 hours) at 09/19/2021 0907 Last data filed at 09/19/2021 0657 Gross per 24 hour  Intake 2991.91 ml  Output 2100 ml  Net 891.91 ml     Groin access site without hematoma  Distal foot continues to appear mottled, however there is a monophasic posterior tibial artery signal.   Laboratory Lab Results: Recent Labs    09/18/21 2011 09/19/21 0113  WBC 18.1* 16.5*  HGB 9.0* 9.4*  HCT 29.7* 29.9*  PLT 416* 393    BMET Recent Labs    09/18/21 0633 09/19/21 0113  NA 133* 130*  K 3.9 4.4  CL 103 101  CO2 21* 21*  GLUCOSE 226* 289*  BUN 12 11  CREATININE 1.22* 1.35*  CALCIUM 8.9 8.9     COAG Lab Results  Component Value Date   INR 1.9 (H) 09/19/2021   INR 1.8 (H) 09/17/2021   INR 1.2 (A) 02/17/2021   No results found for: PTT  Assessment/Planning:  Postop day 1 status post thrombolysis of left lower extremity for acute on chronic limb ischemia.    Imaging demonstrates inline flow to the mid peroneal artery with significant collaterals present. On physical exam patient has a monophasic posterior tibial artery signal. Patient out of rest pain.  Patient aware the most recent lytic is the last therapy we can offer this admission. She is also aware she has a very high chance of limb loss over the next year.  We can transition her to the floor, OOB physical therapy, can restart oral anticoagulation this evening - Warfarin. Patient will be on triple therapy-warfarin, aspirin, Plavix.    Broadus John 09/19/2021 9:07 AM --

## 2021-09-19 NOTE — Progress Notes (Signed)
Pt arrived to 4E12 from [facility] after angiogram with initiation of thrombolysis with Dr. Carlis Abbott 12/16.  Telemetry monitor applied and CCMD notified. CHG bath and skin assessment completed.  Patient oriented to unit and room to include call light and phone. Will continue to monitor.

## 2021-09-19 NOTE — Progress Notes (Signed)
[place] for heparin + warfarin Indication: LLE ischemia  Allergies  Allergen Reactions   Ciprofloxacin Hcl Hives   Dilaudid [Hydromorphone Hcl] Shortness Of Breath and Other (See Comments)    "Asystole," per pt report   Macrobid [Nitrofurantoin Macrocrystal] Hives, Shortness Of Breath and Rash   Other Hives, Shortness Of Breath and Rash    NO "-CILLINS"!!!   Penicillins Shortness Of Breath    Had had cephalosporins without incident Has patient had a PCN reaction causing immediate rash, facial/tongue/throat swelling, SOB or lightheadedness with hypotension: Yes Has patient had a PCN reaction causing severe rash involving mucus membranes or skin necrosis: Unk Has patient had a PCN reaction that required hospitalization: Unk Has patient had a PCN reaction occurring within the last 10 years: No If all of the above answers are "NO", then may proceed with Cephalosporin use.    Sulfa Antibiotics Hives, Shortness Of Breath and Rash   Lexapro [Escitalopram Oxalate] Other (See Comments)    "I just did not like it."    Patient Measurements: Height: 5\' 7"  (170.2 cm) Weight: 114.3 kg (252 lb) IBW/kg (Calculated) : 61.6 Heparin Dosing Weight: 88.2 kg  Vital Signs: Temp: 97.6 F (36.4 C) (12/17 1530) Temp Source: Oral (12/17 1530) BP: 100/58 (12/17 1530) Pulse Rate: 85 (12/17 1530)  Labs: Recent Labs    09/17/21 0406 09/17/21 0623 09/17/21 1638 09/18/21 0633 09/18/21 1550 09/18/21 2011 09/19/21 0113 09/19/21 1151  HGB 10.6*  --    < > 10.3* 10.5* 9.0* 9.4*  --   HCT 33.6*  --    < > 32.6* 34.6* 29.7* 29.9*  --   PLT 507*  --    < > 425* 437* 416* 393  --   APTT 55*  --   --   --   --   --   --   --   LABPROT 21.0*  --   --   --   --   --  22.2*  --   INR 1.8*  --   --   --   --   --  1.9*  --   HEPARINUNFRC  --   --    < > <0.10*  --  <0.10* <0.10* 0.11*  CREATININE 1.67*  --   --  1.22*  --   --  1.35*  --   TROPONINIHS 21* 8  --   --    --   --   --   --    < > = values in this interval not displayed.     Estimated Creatinine Clearance: 68 mL/min (A) (by C-G formula based on SCr of 1.35 mg/dL (H)).   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 Cap refill is > 3 secs. Pt on warfarin PTA for extensive vascular hx. Heparin was initiated and pt underwent peripheral lysis.  Pt s/p lysis with heparin resumed overnight, now to restart warfarin as well. Last warfarin dose was prior to admit, INR today is subtherapeutic at 1.9. Afternoon heparin level subtherapeutic at 0.11.  *Home warfarin dose = 5mg  MWFSun, 10mg  AODs  Goal of Therapy: Heparin level 0.3-0.7 units/ml Monitor platelets by anticoagulation protocol: Yes   Plan:  -Increase heparin to 2100 units/h -Warfarin 10mg  PO x1 tonight -Recheck heparin level in 6h -Daily INR, CBC, heparin level    Arrie Senate, PharmD, BCPS, [facility] Clinical Pharmacist [phone] Please check AMION for all [place] numbers 09/19/2021

## 2021-09-19 NOTE — Progress Notes (Signed)
[facility] for heparin Indication: LLE ischemia  Allergies  Allergen Reactions   Ciprofloxacin Hcl Hives   Dilaudid [Hydromorphone Hcl] Shortness Of Breath and Other (See Comments)    "Asystole," per pt report   Macrobid [Nitrofurantoin Macrocrystal] Hives, Shortness Of Breath and Rash   Other Hives, Shortness Of Breath and Rash    NO "-CILLINS"!!!   Penicillins Shortness Of Breath    Had had cephalosporins without incident Has patient had a PCN reaction causing immediate rash, facial/tongue/throat swelling, SOB or lightheadedness with hypotension: Yes Has patient had a PCN reaction causing severe rash involving mucus membranes or skin necrosis: Unk Has patient had a PCN reaction that required hospitalization: Unk Has patient had a PCN reaction occurring within the last 10 years: No If all of the above answers are "NO", then may proceed with Cephalosporin use.    Sulfa Antibiotics Hives, Shortness Of Breath and Rash   Lexapro [Escitalopram Oxalate] Other (See Comments)    "I just did not like it."    Patient Measurements: Height: 5\' 7"  (170.2 cm) Weight: 114.3 kg (252 lb) IBW/kg (Calculated) : 61.6 Heparin Dosing Weight: 88.2 kg  Vital Signs: Temp: 97.8 F (36.6 C) (12/17 2322) Temp Source: Oral (12/17 2322) BP: 114/55 (12/17 2322) Pulse Rate: 89 (12/17 2322)  Labs: Recent Labs    09/17/21 0406 09/17/21 0623 09/17/21 1638 09/18/21 0633 09/18/21 1550 09/18/21 2011 09/19/21 0113 09/19/21 1151 09/19/21 2247  HGB 10.6*  --    < > 10.3* 10.5* 9.0* 9.4*  --   --   HCT 33.6*  --    < > 32.6* 34.6* 29.7* 29.9*  --   --   PLT 507*  --    < > 425* 437* 416* 393  --   --   APTT 55*  --   --   --   --   --   --   --   --   LABPROT 21.0*  --   --   --   --   --  22.2*  --   --   INR 1.8*  --   --   --   --   --  1.9*  --   --   HEPARINUNFRC  --   --    < > <0.10*  --  <0.10* <0.10* 0.11* 0.24*  CREATININE 1.67*  --   --  1.22*  --   --   1.35*  --   --   TROPONINIHS 21* 8  --   --   --   --   --   --   --    < > = values in this interval not displayed.     Estimated Creatinine Clearance: 68 mL/min (A) (by C-G formula based on SCr of 1.35 mg/dL (H)).   Medical History: Past Medical History:  Diagnosis Date   Anxiety    Depression    Diabetes ([place])    Type 2   GERD (gastroesophageal reflux disease)    History of blood clots    ,DVT-left leg, and early 2000, had blood clot behind left breast   History of kidney stones    Hypercalcemia    Hyperparathyroidism    Hypertension    had been on Lisinopril and PCP took her off it 4 months ago   Iron deficiency anemia    Left thyroid nodule    Mass of right ovary    Nephrolithiasis  PAOD (peripheral arterial occlusive disease) (HCC)    Peripheral vascular disease (HCC)    Prolonged Q-T interval on ECG 04/19/2019   Renal calculi    Substance abuse ([place])    Tooth loose    top tooth from the right  is loose     Medications: see MAR  Assessment: 45 yo F with LLE ischemia - no pedal or posterior tibial pulse. Cap refill is > 3 secs. Pt on warfarin PTA for extensive vascular hx. Heparin was initiated and pt underwent peripheral lysis.  Pt s/p lysis recheck with plans to resume IV heparin now. Heparin resumed in cath lab at 1250 units/h - will check 6h heparin level.  12/17 PM update:  Heparin level is low but trending up  Goal of Therapy: Heparin level 0.3-0.7 units/ml Monitor platelets by anticoagulation protocol: Yes   Plan:  Inc heparin to 2300 units/hr 0800 heparin level  Narda Bonds, PharmD, BCPS Clinical Pharmacist Phone: [phone]

## 2021-09-20 DIAGNOSIS — R739 Hyperglycemia, unspecified: Secondary | ICD-10-CM

## 2021-09-20 DIAGNOSIS — U071 COVID-19: Secondary | ICD-10-CM | POA: Diagnosis not present

## 2021-09-20 DIAGNOSIS — N179 Acute kidney failure, unspecified: Secondary | ICD-10-CM | POA: Diagnosis not present

## 2021-09-20 DIAGNOSIS — I70229 Atherosclerosis of native arteries of extremities with rest pain, unspecified extremity: Secondary | ICD-10-CM | POA: Diagnosis not present

## 2021-09-20 LAB — D-DIMER, QUANTITATIVE: D-Dimer, Quant: 5.49 ug/mL-FEU — ABNORMAL HIGH (ref 0.00–0.50)

## 2021-09-20 LAB — CBC
HCT: 29.3 % — ABNORMAL LOW (ref 36.0–46.0)
Hemoglobin: 8.9 g/dL — ABNORMAL LOW (ref 12.0–15.0)
MCH: 24.6 pg — ABNORMAL LOW (ref 26.0–34.0)
MCHC: 30.4 g/dL (ref 30.0–36.0)
MCV: 80.9 fL (ref 80.0–100.0)
Platelets: 435 10*3/uL — ABNORMAL HIGH (ref 150–400)
RBC: 3.62 MIL/uL — ABNORMAL LOW (ref 3.87–5.11)
RDW: 17.1 % — ABNORMAL HIGH (ref 11.5–15.5)
WBC: 12.9 10*3/uL — ABNORMAL HIGH (ref 4.0–10.5)
nRBC: 0 % (ref 0.0–0.2)

## 2021-09-20 LAB — BASIC METABOLIC PANEL
Anion gap: 8 (ref 5–15)
BUN: 21 mg/dL — ABNORMAL HIGH (ref 6–20)
CO2: 21 mmol/L — ABNORMAL LOW (ref 22–32)
Calcium: 9 mg/dL (ref 8.9–10.3)
Chloride: 104 mmol/L (ref 98–111)
Creatinine, Ser: 1.57 mg/dL — ABNORMAL HIGH (ref 0.44–1.00)
GFR, Estimated: 41 mL/min — ABNORMAL LOW (ref >60)
Glucose, Bld: 137 mg/dL — ABNORMAL HIGH (ref 70–99)
Potassium: 4.6 mmol/L (ref 3.5–5.1)
Sodium: 133 mmol/L — ABNORMAL LOW (ref 135–145)

## 2021-09-20 LAB — GLUCOSE, CAPILLARY
Glucose-Capillary: 117 mg/dL — ABNORMAL HIGH (ref 70–99)
Glucose-Capillary: 116 mg/dL — ABNORMAL HIGH (ref 70–99)
Glucose-Capillary: 106 mg/dL — ABNORMAL HIGH (ref 70–99)
Glucose-Capillary: 100 mg/dL — ABNORMAL HIGH (ref 70–99)

## 2021-09-20 LAB — HEPARIN LEVEL (UNFRACTIONATED)
Heparin Unfractionated: 0.18 IU/mL — ABNORMAL LOW (ref 0.30–0.70)
Heparin Unfractionated: 0.48 IU/mL (ref 0.30–0.70)

## 2021-09-20 LAB — PROTIME-INR
INR: 1.8 — ABNORMAL HIGH (ref 0.8–1.2)
Prothrombin Time: 20.6 seconds — ABNORMAL HIGH (ref 11.4–15.2)

## 2021-09-20 LAB — C-REACTIVE PROTEIN: CRP: 16.1 mg/dL — ABNORMAL HIGH (ref <1.0)

## 2021-09-20 LAB — FERRITIN: Ferritin: 43 ng/mL (ref 11–307)

## 2021-09-20 MED ORDER — SENNA 8.6 MG PO TABS
2.0000 | ORAL_TABLET | Freq: Every day | ORAL | Status: DC
  Administered 2021-09-20 – 2021-09-26 (×6): 17.2 mg via ORAL
  Filled 2021-09-20 (×6): qty 2

## 2021-09-20 MED ORDER — SODIUM CHLORIDE 0.9 % IV SOLN: INTRAVENOUS | Status: AC

## 2021-09-20 MED ORDER — SALINE SPRAY 0.65 % NA SOLN
1.0000 | NASAL | Status: DC | PRN
  Filled 2021-09-20 (×2): qty 44

## 2021-09-20 MED ORDER — POLYETHYLENE GLYCOL 3350 17 G PO PACK
17.0000 g | PACK | Freq: Every day | ORAL | Status: DC
  Administered 2021-09-20 – 2021-09-23 (×2): 17 g via ORAL
  Filled 2021-09-20 (×6): qty 1

## 2021-09-20 MED ORDER — ALBUTEROL SULFATE HFA 108 (90 BASE) MCG/ACT IN AERS
1.0000 | INHALATION_SPRAY | Freq: Four times a day (QID) | RESPIRATORY_TRACT | Status: DC | PRN
  Administered 2021-09-24: 20:00:00 2 via RESPIRATORY_TRACT
  Filled 2021-09-20 (×2): qty 6.7

## 2021-09-20 MED ORDER — ASPIRIN EC 81 MG PO TBEC
81.0000 mg | DELAYED_RELEASE_TABLET | Freq: Every day | ORAL | Status: DC
  Administered 2021-09-20 – 2021-09-26 (×7): 81 mg via ORAL
  Filled 2021-09-20 (×7): qty 1

## 2021-09-20 MED ORDER — WARFARIN SODIUM 5 MG PO TABS
5.0000 mg | ORAL_TABLET | Freq: Once | ORAL | Status: AC
  Administered 2021-09-20: 17:00:00 5 mg via ORAL
  Filled 2021-09-20: qty 1

## 2021-09-20 NOTE — Progress Notes (Signed)
[place] for heparin + warfarin Indication: LLE ischemia  Allergies  Allergen Reactions   Ciprofloxacin Hcl Hives   Dilaudid [Hydromorphone Hcl] Shortness Of Breath and Other (See Comments)    "Asystole," per pt report   Macrobid [Nitrofurantoin Macrocrystal] Hives, Shortness Of Breath and Rash   Other Hives, Shortness Of Breath and Rash    NO "-CILLINS"!!!   Penicillins Shortness Of Breath    Had had cephalosporins without incident Has patient had a PCN reaction causing immediate rash, facial/tongue/throat swelling, SOB or lightheadedness with hypotension: Yes Has patient had a PCN reaction causing severe rash involving mucus membranes or skin necrosis: Unk Has patient had a PCN reaction that required hospitalization: Unk Has patient had a PCN reaction occurring within the last 10 years: No If all of the above answers are "NO", then may proceed with Cephalosporin use.    Sulfa Antibiotics Hives, Shortness Of Breath and Rash   Lexapro [Escitalopram Oxalate] Other (See Comments)    "I just did not like it."    Patient Measurements: Height: 5\' 7"  (170.2 cm) Weight: 114.3 kg (252 lb) IBW/kg (Calculated) : 61.6 Heparin Dosing Weight: 88.2 kg  Vital Signs: Temp: 98.1 F (36.7 C) (12/18 1652) Temp Source: Oral (12/18 1652) BP: 102/60 (12/18 1652) Pulse Rate: 102 (12/18 1416)  Labs: Recent Labs    09/18/21 0633 09/18/21 1550 09/18/21 2011 09/19/21 0113 09/19/21 1151 09/19/21 2247 09/20/21 0726 09/20/21 0736 09/20/21 1622  HGB 10.3*   < > 9.0* 9.4*  --   --  8.9*  --   --   HCT 32.6*   < > 29.7* 29.9*  --   --  29.3*  --   --   PLT 425*   < > 416* 393  --   --  435*  --   --   LABPROT  --   --   --  22.2*  --   --  20.6*  --   --   INR  --   --   --  1.9*  --   --  1.8*  --   --   HEPARINUNFRC <0.10*  --  <0.10* <0.10*   < > 0.24*  --  0.18* 0.48  CREATININE 1.22*  --   --  1.35*  --   --  1.57*  --   --    < > = values in this  interval not displayed.     Estimated Creatinine Clearance: 58.5 mL/min (A) (by C-G formula based on SCr of 1.57 mg/dL (H)).   Medical History: Past Medical History:  Diagnosis Date   Anxiety    Depression    Diabetes ([place])    Type 2   GERD (gastroesophageal reflux disease)    History of blood clots    ,DVT-left leg, and early 2000, had blood clot behind left breast   History of kidney stones    Hypercalcemia    Hyperparathyroidism    Hypertension    had been on Lisinopril and PCP took her off it 4 months ago   Iron deficiency anemia    Left thyroid nodule    Mass of right ovary    Nephrolithiasis    PAOD (peripheral arterial occlusive disease) (HCC)    Peripheral vascular disease (HCC)    Prolonged Q-T interval on ECG 04/19/2019   Renal calculi    Substance abuse ([place])    Tooth loose    top tooth from the right  is loose     Medications: see MAR  Assessment:  45 yo F with LLE ischemia - no pedal or posterior tibial pulse. Cap refill is > 3 secs. Pt on warfarin PTA for extensive vascular hx. Heparin was initiated and pt underwent peripheral lysis.  Heparin level therapeutic at 0.48.   *Home warfarin dose = 5mg  MWFSun, 10mg  AODs  Goal of Therapy: Heparin level 0.3-0.7 units/ml Monitor platelets by anticoagulation protocol: Yes   Plan:  Continue heparin 2600 units/h Heparin level with am labs  Arrie Senate, PharmD, BCPS, [facility] Clinical Pharmacist [phone] Please check AMION for all [facility] numbers 09/20/2021

## 2021-09-20 NOTE — Social Work (Signed)
CSW acknoweledges consult for homeless resources. CSW attempted to reach pt via phone and was unsucessful.  TOC team will continue to follow.

## 2021-09-20 NOTE — Progress Notes (Signed)
[place] for heparin + warfarin Indication: LLE ischemia  Allergies  Allergen Reactions   Ciprofloxacin Hcl Hives   Dilaudid [Hydromorphone Hcl] Shortness Of Breath and Other (See Comments)    "Asystole," per pt report   Macrobid [Nitrofurantoin Macrocrystal] Hives, Shortness Of Breath and Rash   Other Hives, Shortness Of Breath and Rash    NO "-CILLINS"!!!   Penicillins Shortness Of Breath    Had had cephalosporins without incident Has patient had a PCN reaction causing immediate rash, facial/tongue/throat swelling, SOB or lightheadedness with hypotension: Yes Has patient had a PCN reaction causing severe rash involving mucus membranes or skin necrosis: Unk Has patient had a PCN reaction that required hospitalization: Unk Has patient had a PCN reaction occurring within the last 10 years: No If all of the above answers are "NO", then may proceed with Cephalosporin use.    Sulfa Antibiotics Hives, Shortness Of Breath and Rash   Lexapro [Escitalopram Oxalate] Other (See Comments)    "I just did not like it."    Patient Measurements: Height: 5\' 7"  (170.2 cm) Weight: 114.3 kg (252 lb) IBW/kg (Calculated) : 61.6 Heparin Dosing Weight: 88.2 kg  Vital Signs: Temp: 98 F (36.7 C) (12/18 0644) Temp Source: Oral (12/18 0644) BP: 121/73 (12/18 0644) Pulse Rate: 99 (12/18 0644)  Labs: Recent Labs    09/18/21 0633 09/18/21 1550 09/18/21 2011 09/19/21 0113 09/19/21 1151 09/19/21 2247  HGB 10.3* 10.5* 9.0* 9.4*  --   --   HCT 32.6* 34.6* 29.7* 29.9*  --   --   PLT 425* 437* 416* 393  --   --   LABPROT  --   --   --  22.2*  --   --   INR  --   --   --  1.9*  --   --   HEPARINUNFRC <0.10*  --  <0.10* <0.10* 0.11* 0.24*  CREATININE 1.22*  --   --  1.35*  --   --      Estimated Creatinine Clearance: 68 mL/min (A) (by C-G formula based on SCr of 1.35 mg/dL (H)).   Medical History: Past Medical History:  Diagnosis Date   Anxiety     Depression    Diabetes ([place])    Type 2   GERD (gastroesophageal reflux disease)    History of blood clots    ,DVT-left leg, and early 2000, had blood clot behind left breast   History of kidney stones    Hypercalcemia    Hyperparathyroidism    Hypertension    had been on Lisinopril and PCP took her off it 4 months ago   Iron deficiency anemia    Left thyroid nodule    Mass of right ovary    Nephrolithiasis    PAOD (peripheral arterial occlusive disease) (HCC)    Peripheral vascular disease (HCC)    Prolonged Q-T interval on ECG 04/19/2019   Renal calculi    Substance abuse (HCC)    Tooth loose    top tooth from the right  is loose     Medications: see MAR  Assessment:  45 yo F with LLE ischemia - no pedal or posterior tibial pulse. Cap refill is > 3 secs. Pt on warfarin PTA for extensive vascular hx. Heparin was initiated and pt underwent peripheral lysis.  Pt s/p lysis with heparin resumed overnight, now to restart warfarin as well. INR remains subtherapeutic at 1.8. Heparin level subtherapeutic (0.18) on 2300 units/hr after two rate  increases. Drawn appropriately. CBC stable.  *Home warfarin dose = 5mg  MWFSun, 10mg  AODs  Goal of Therapy: Heparin level 0.3-0.7 units/ml Monitor platelets by anticoagulation protocol: Yes   Plan:  Increase heparin to 2600 units/h Warfarin 5mg  PO x1 tonight Recheck heparin level in 6h Daily INR, CBC, heparin level

## 2021-09-20 NOTE — Evaluation (Signed)
Physical Therapy Evaluation Patient Details Name: Terri Sparks MRN: [id number] DOB: 28-Sep-1976 Today's Date: 09/20/2021  History of Present Illness  45 y.o. female presents to [facility] hospital on 09/17/2021 with worsening L foot pain and discoloration, admitted for acute ischemic left lower extremity. Pt underwent left lower extremity angiogram with initiation of thrombolysis on 09/17/2021. Pt underwent aortogram and trhombectomy on 12/16. 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  Clinical Impression  Pt presents to PT with deficits in activity tolerance, strength, power, gait, balance. Pt is limited by LLE pain, resulting in reduced gait speed and impaired LLE function. Pt demonstrates the ability to ambulate to/from bathroom with PT assistance for safety. Pt will benefit from aggressive mobilization and continued acute PT services to aide in a return to independence as pt is newly without housing. PT recommends SNF placement currently as the pt would need to bed at a modI level if discharging to the community.       Recommendations for follow up therapy are one component of a multi-disciplinary discharge planning process, led by the attending physician.  Recommendations may be updated based on patient status, additional functional criteria and insurance authorization.  Follow Up Recommendations Skilled nursing-short term rehab (<3 hours/day)    Assistance Recommended at Discharge Intermittent Supervision/Assistance  Functional Status Assessment Patient has had a recent decline in their functional status and demonstrates the ability to make significant improvements in function in a reasonable and predictable amount of time.  Equipment Recommendations  Rolling walker (2 wheels)    Recommendations for Other Services       Precautions / Restrictions Precautions Precautions:  Fall Restrictions Weight Bearing Restrictions: No      Mobility  Bed Mobility Overal bed mobility: Modified Independent             General bed mobility comments: supine to sit, sit to supine, rolling    Transfers Overall transfer level: Needs assistance Equipment used: Rolling walker (2 wheels) Transfers: Sit to/from Stand Sit to Stand: Min guard                Ambulation/Gait Ambulation/Gait assistance: Min guard Gait Distance (Feet): 15 Feet (15' x 2 to and from bathroom) Assistive device: Rolling walker (2 wheels) Gait Pattern/deviations: Step-to pattern Gait velocity: reduced Gait velocity interpretation: <1.8 ft/sec, indicate of risk for recurrent falls   General Gait Details: pt with slowed step-to gait, reduced stance time on LLE  Stairs            Wheelchair Mobility    Modified Rankin (Stroke Patients Only)       Balance Overall balance assessment: Needs assistance Sitting-balance support: No upper extremity supported;Feet supported Sitting balance-Leahy Scale: Good     Standing balance support: Reliant on assistive device for balance Standing balance-Leahy Scale: Poor                               Pertinent Vitals/Pain Pain Assessment: Faces Faces Pain Scale: Hurts whole lot Pain Location: LLE Pain Descriptors / Indicators: Grimacing Pain Intervention(s): Monitored during session    Home Living Family/patient expects to be discharged to:: Unsure (pt reports becoming homeless when coming to hospital) Living Arrangements:  (unable to identify caregiver support)                      Prior Function Prior Level  of Function : Independent/Modified Independent                     Hand Dominance   Dominant Hand: Right    Extremity/Trunk Assessment   Upper Extremity Assessment Upper Extremity Assessment: Overall WFL for tasks assessed    Lower Extremity Assessment Lower Extremity Assessment: LLE  deficits/detail LLE Deficits / Details: generalized weakness, 4-/5 grossly, pain limiting    Cervical / Trunk Assessment Cervical / Trunk Assessment: Normal  Communication   Communication: No difficulties  Cognition Arousal/Alertness: Awake/alert Behavior During Therapy: WFL for tasks assessed/performed Overall Cognitive Status: Within Functional Limits for tasks assessed                                          General Comments General comments (skin integrity, edema, etc.): VSS on RA    Exercises     Assessment/Plan    PT Assessment Patient needs continued PT services  PT Problem List Decreased strength;Decreased activity tolerance;Decreased balance;Decreased mobility;Cardiopulmonary status limiting activity;Pain       PT Treatment Interventions DME instruction;Gait training;Functional mobility training;Therapeutic activities;Therapeutic exercise;Balance training;Neuromuscular re-education;Patient/family education;Stair training    PT Goals (Current goals can be found in the Care Plan section)  Acute Rehab PT Goals Patient Stated Goal: to go to rehab and regain independence in mobility PT Goal Formulation: With patient Time For Goal Achievement: 10/04/21 Potential to Achieve Goals: Good    Frequency Min 3X/week (maintain 3x to progress mobility as pt may be difficult to place)   Barriers to discharge        Co-evaluation               AM-PAC PT "6 Clicks" Mobility  Outcome Measure Help needed turning from your back to your side while in a flat bed without using bedrails?: None Help needed moving from lying on your back to sitting on the side of a flat bed without using bedrails?: None Help needed moving to and from a bed to a chair (including a wheelchair)?: A Little Help needed standing up from a chair using your arms (e.g., wheelchair or bedside chair)?: A Little Help needed to walk in hospital room?: A Little Help needed climbing 3-5  steps with a railing? : Total 6 Click Score: 18    End of Session   Activity Tolerance: Patient limited by pain Patient left: in bed;with call bell/phone within reach Nurse Communication: Mobility status PT Visit Diagnosis: Other abnormalities of gait and mobility (R26.89);Muscle weakness (generalized) (M62.81);Pain Pain - Right/Left: Left Pain - part of body: Leg;Ankle and joints of foot    Time: 4718-5501 PT Time Calculation (min) (ACUTE ONLY): 21 min   Charges:   PT Evaluation $PT Eval Low Complexity: 1 Low          Zenaida Niece, PT, DPT Acute Rehabilitation Pager: [phone] Office [phone]   Zenaida Niece 09/20/2021, 3:53 PM

## 2021-09-20 NOTE — Progress Notes (Signed)
PROGRESS NOTE   Terri Sparks  WEX:[id number]    DOB: 1976-01-28    DOA: 09/17/2021  PCP: Ladell Pier, MD   I have briefly reviewed patients previous medical records in [facility].  Chief Complaint  Patient presents with   Leg Pain    Brief Narrative:  45 year old female with medical history significant for chronic hypercoagulable state with multiple arterial thrombosis and embolization, s/p multiple thrombolysis, on chronic Coumadin therapy, HTN, type II DM/IDDM, HLD, anxiety/depression, tobacco use disorder, GERD, presented with worsening left foot pain and discoloration.  Admitted by vascular surgery for acute ischemic left lower extremity for which he underwent left lower extremity angiogram with initiation of thrombolysis.  TRH were consulted for evaluation and management of incidentally positive COVID-19 PCR testing.   Assessment & Plan:  Principal Problem:   Critical lower limb ischemia ([place])   COVID-19 infection, incidental: Her symptoms reportedly started about 7 days prior to admission.  Currently asymptomatic.  Stable without hypoxia on room air.  Inflammatory markers elevation likely related to leg ischemia and intervention rather than the COVID infection.  No specific treatment initiated.  Supportive care as needed.  Airborne and contact isolation.  Type II DM/IDDM, uncontrolled with hyperglycemia: A1c of 10.6 indicates very poor outpatient control.  Diabetes coordinator consulted.  Changed diet from regular to diabetic diet.  Continue current resistant SSI.  Resumed home Lantus but at reduced dose of 50 units daily.  Added mealtime NovoLog 3 units 3 times daily.  Glycemic control much improved in the last 24 hours. Adjust insulins as needed.  Pseudohyponatremia due to hyperglycemia.  Acute left lower extremity ischemia: S/p angiogram and initiation of thrombolysis on 12/15.  Management per vascular surgery.  Back to the OR 12/16, extensive thrombectomy,  stenting, angioplasty of left lower extremity multiple vessels.  VVS follow-up appreciated, improved, no rest pain, high risk of limb loss over the next year, resumed triple therapy with warfarin, aspirin and Plavix and IV heparin bridge.  Essential hypertension: Controlled on amlodipine 5 Mg daily and carvedilol.  Tobacco use disorder: Cessation counseled.  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  Creatinine back up from 1.22-1.35-1.57 likely related to contrast during procedure yesterday.  Continue to trend daily BMP.  Clinically euvolemic.  Iron deficiency anemia, chronic anemia: Stable.  Asthma/COPD: Stable.  Homelessness: Patient reports that she was living with her fianc prior to admission but now has nowhere to go upon discharge.  TOC consulted  Constipation: Initiated bowel regimen.  Body mass index is 39.47 kg/m./Morbid obesity.   DVT prophylaxis:   On IV heparin bridge and Coumadin per pharmacy.   Code Status: Full Code Family Communication: None at bedside. Disposition:  Status is: Inpatient         Consultants:   TRH are consultants.  Procedures:   As noted above  Antimicrobials:   None.   Subjective:  Reports constipation, passing flatus, no BM since admission.  No abdominal pain.  Had an episode of nonbloody emesis this morning.  Asking for albuterol inhaler and [place] nasal saline spray for nose congestion.  No dyspnea, cough or chest pain.  Objective:   Vitals:   09/19/21 2322 09/20/21 0644 09/20/21 0820 09/20/21 0821  BP: (!) 114/55 121/73 94/67 94/67   Pulse: 89 99 (!) 107 (!) 107  Resp: 18 17 17 17   Temp: 97.8 F (36.6 C) 98 F (  36.7 C) 97.9 F (36.6 C) 97.9 F (36.6 C)  TempSrc: Oral Oral Oral   SpO2: 100% 92% 92% 92%  Weight:      Height:        General exam: Young female, moderately built and  morbidly obese lying comfortably propped up in bed without distress. Respiratory system: Clear to auscultation.  No increased work of breathing. Cardiovascular system: S1 and S2 heard, RRR.  No JVD, murmurs or pedal edema. Gastrointestinal system: Abdomen is nondistended, soft and nontender. No organomegaly or masses felt. Normal bowel sounds heard. Central nervous system: Alert and oriented. No focal neurological deficits. Extremities: Symmetric 5 x 5 power.  Right lower extremity unremarkable.  Left lower extremity looks pink and perfused, warm to touch, ,?  Feeble dorsalis pedis felt. Skin: No rashes, lesions or ulcers Psychiatry: Judgement and insight appear normal. Mood & affect appropriate.     Data Reviewed:   I have personally reviewed following labs and imaging studies   CBC: Recent Labs  Lab 09/17/21 0406 09/17/21 1638 09/18/21 2011 09/19/21 0113 09/20/21 0726  WBC 14.5*   < > 18.1* 16.5* 12.9*  NEUTROABS 10.1*  --   --   --   --   HGB 10.6*   < > 9.0* 9.4* 8.9*  HCT 33.6*   < > 29.7* 29.9* 29.3*  MCV 79.8*   < > 80.5 78.7* 80.9  PLT 507*   < > 416* 393 435*   < > = values in this interval not displayed.    Basic Metabolic Panel: Recent Labs  Lab 09/17/21 0406 09/18/21 0633 09/19/21 0113 09/20/21 0726  NA 130* 133* 130* 133*  K 4.1 3.9 4.4 4.6  CL 98 103 101 104  CO2 25 21* 21* 21*  GLUCOSE 396* 226* 289* 137*  BUN 13 12 11  21*  CREATININE 1.67* 1.22* 1.35* 1.57*  CALCIUM 9.3 8.9 8.9 9.0    Liver Function Tests: Recent Labs  Lab 09/17/21 0406  AST 12*  ALT 13  ALKPHOS 93  BILITOT 0.2*  PROT 7.3  ALBUMIN 2.9*    CBG: Recent Labs  Lab 09/19/21 1554 09/19/21 2131 09/20/21 0642  GLUCAP 154* 177* 117*    Microbiology Studies:   Recent Results (from the past 240 hour(s))  Resp Panel by RT-PCR (Flu A&B, Covid) Nasopharyngeal Swab     Status: Abnormal   Collection Time: 09/17/21 10:39 AM   Specimen: Nasopharyngeal Swab; Nasopharyngeal(NP)  swabs in vial transport medium  Result Value Ref Range Status   SARS Coronavirus 2 by RT PCR POSITIVE (A) NEGATIVE Final    Comment: (NOTE) SARS-CoV-2 target nucleic acids are DETECTED.  The SARS-CoV-2 RNA is generally detectable in upper respiratory specimens during the acute phase of infection. Positive results are indicative of the presence of the identified virus, but do not rule out bacterial infection or co-infection with other pathogens not detected by the test. Clinical correlation with patient history and other diagnostic information is necessary to determine patient infection status. The expected result is Negative.  Fact Sheet for Patients: EntrepreneurPulse.com.au  Fact Sheet for Healthcare Providers: IncredibleEmployment.be  This test is not yet approved or cleared by the [place] FDA and  has been authorized for detection and/or diagnosis of SARS-CoV-2 by FDA under an Emergency Use Authorization (EUA).  This EUA will remain in effect (meaning this test can be used) for the duration of  the COVID-19 declaration under Section 564(b)(1) of the A ct, 21 U.S.C. section 360bbb-3(b)(1), unless the  authorization is terminated or revoked sooner.     Influenza A by PCR NEGATIVE NEGATIVE Final   Influenza B by PCR NEGATIVE NEGATIVE Final    Comment: (NOTE) The Xpert Xpress SARS-CoV-2/FLU/RSV plus assay is intended as an aid in the diagnosis of influenza from Nasopharyngeal swab specimens and should not be used as a sole basis for treatment. Nasal washings and aspirates are unacceptable for Xpert Xpress SARS-CoV-2/FLU/RSV testing.  Fact Sheet for Patients: EntrepreneurPulse.com.au  Fact Sheet for Healthcare Providers: IncredibleEmployment.be  This test is not yet approved or cleared by the [place] FDA and has been authorized for detection and/or diagnosis of SARS-CoV-2 by FDA under an  Emergency Use Authorization (EUA). This EUA will remain in effect (meaning this test can be used) for the duration of the COVID-19 declaration under Section 564(b)(1) of the Act, 21 U.S.C. section 360bbb-3(b)(1), unless the authorization is terminated or revoked.  Performed at [facility], [place] [address] [place], [place] [address]   MRSA Next Gen by PCR, Nasal     Status: None   Collection Time: 09/17/21  5:00 PM   Specimen: Nasal Mucosa; Nasal Swab  Result Value Ref Range Status   MRSA by PCR Next Gen NOT DETECTED NOT DETECTED Final    Comment: (NOTE) The GeneXpert MRSA Assay (FDA approved for NASAL specimens only), is one component of a comprehensive MRSA colonization surveillance program. It is not intended to diagnose MRSA infection nor to guide or monitor treatment for MRSA infections. Test performance is not FDA approved in patients less than 87 years old. Performed at [facility], [place] [address], [place], [place] [address]     Radiology Studies:  PERIPHERAL VASCULAR CATHETERIZATION  Result Date: 09/18/2021 DATE OF SERVICE: 09/18/2021  PATIENT:  Terri Sparks  45 y.o. female  PRE-OPERATIVE DIAGNOSIS:  Thrombosis of left lower extremity causing acute limb ischemia, ongoing thrombolysis  POST-OPERATIVE DIAGNOSIS:  Same  PROCEDURE:  1) Left lower extremity angiogram with third order cannulation (128mL total contrast) 2) Conscious sedation (95 minutes) 3) Left common iliac artery stenting (6x62mm VBX) 4) Left profunda femoris mechanical thrombectomy (Penumbra) 5) Left popliteal mechanical thrombectomy (Penumbra) 6) Left peroneal mechanical thrombectomy (Penumbra) 7) Left popliteal angioplasty and stenting (5x144mm viabahn, post dilated to 64mm) 8) Left peroneal angioplasty (3x233mm [place])  SURGEON:  [name]. Stanford Breed, MD  ASSISTANT: none  ANESTHESIA:   local and IV sedation  ESTIMATED BLOOD LOSS: minimal  LOCAL MEDICATIONS USED:  LIDOCAINE  COUNTS: confirmed correct.   PATIENT DISPOSITION:  PACU - hemodynamically stable.  Delay start of Pharmacological VTE agent (>24hrs) due to surgical blood loss or risk of bleeding: no  INDICATION FOR PROCEDURE: Terri Sparks is a 45 y.o. female with left lower extremity acute limb ischemia with ongoing thrombolysis. After careful discussion of risks, benefits, and alternatives the patient was offered lysis recheck. The patient understood and wished to proceed.  OPERATIVE FINDINGS: A small flow channel was created from thrombolysis overnight into the left thigh. The previously placed left common iliac stent had thrombus associated with it causing significant stenosis into the left lower extremity.  This resolved with relining with a 6 x 39 mm VBX. Thrombus embolized to her le after better flow was established into the left lower extremity, I was able to see a large burden of thrombus in the profunda femoris artery limiting flow into the thigh.  This was treated with mechanical thrombectomy with the penumbra device.  The thrombus was cleared, but  no improvement of flow was noted. The popliteal artery occluded just above the knee joint.  I was able to pass a wire into the peroneal artery without resistance to flow.  I then performed mechanical thrombectomy with the penumbra device from the ankle to the knee.  A flow channel was created, but significant chronic thrombus remained about the knee.  I attempted to treat the chronic thrombus with angioplasty to try to mold this to the wall of the vessel.  This worsen the problem.  I brought a self-expanding covered stent (Viabahn 5 x 100 mm) across the lesion and deployed it without difficulty.  This resolved the popliteal disease.  Flow was noted through the tibioperoneal trunk into the proximal PT and peroneal artery.  Both occluded mid calf.  There was virtually no outflow in the foot.  I was concerned that she had showered embolus into her pedal vessels.  Another attempted angioplasty into the  terminal peroneal artery was not effective at improving flow into the foot.  A posterior tibial artery signal was noted at the foot at this point.  We ended the case here.  DESCRIPTION OF PROCEDURE: After identification of the patient in the pre-operative holding area, the patient was transferred to the operating room. The patient was positioned supine on the operating room table. Anesthesia was induced. The groins was prepped and draped in standard fashion. A surgical pause was performed confirming correct patient, procedure, and operative location.  The patient was systemically heparinized with 10,000 units of heparin.  Heparin was redosed at 1 hour.  The previously placed sheath in the right groin was upsized to 7 [place] by 45 cm.  Diagnostic angiography was performed in the left lower extremity.  See above for details.  First the common iliac artery stent relining was performed with a 6 x 39 mm VBX balloon expandable stent.  Good technical result was achieved. Next mechanical thrombectomy of the left profunda femoris artery was performed with the penumbra device.  Good technical result was achieved, but flow was not improved through this artery. Next mechanical thrombectomy was performed from the terminal left peroneal artery to the popliteal artery occlusion using a CAT 6 device.  Fair technical result was achieved.  A flow channel was established. A popliteal artery was then angioplastied.  This worsened thrombus associated in the popliteal artery.  I elected to place a 5 x 100 mm Viabahn stent across the knee joint.  This was postdilated with a 4 mm balloon angioplasty.  Good technical result was noted.  Flow was established in the tibioperoneal trunk. Next, angiogram was performed and revealed occlusion of the posterior tibial and peroneal arteries in the mid calf.  I was able to navigate a 014 wire into the terminal peroneal artery and perform angioplasty from the ankle to our previous intervention.  This  did not augment flow into the foot. At this point I had no further options for intervention for this unfortunate woman.  We ended the case here.  A Perclose device was used to close the right common femoral arteriotomy.  Good hemostasis was achieved.  Conscious sedation was administered with the use of IV fentanyl and midazolam under continuous physician and nurse monitoring.  Heart rate, blood pressure, and oxygen saturation were continuously monitored.  Total sedation time was 95 minutes  Upon completion of the case instrument and sharps counts were confirmed correct. The patient was transferred to the  PACU in good condition. I was present for all portions  of the procedure.  PLAN: Patient needs maximal anticoagulation therapy: Aspirin, Plavix, Coumadin indefinitely.  We will transfer back to ICU.  Okay for stepdown unit.  We will have to monitor clinical exam.  High risk for below-knee amputation.  [name]. Stanford Breed, MD [facility] Phone Number: [phone] 09/18/2021 2:26 PM    Scheduled Meds:    amLODipine  5 mg Oral Daily   aspirin EC  81 mg Oral Daily   atorvastatin  40 mg Oral Daily   buPROPion  150 mg Oral BID   carvedilol  3.125 mg Oral BID   Chlorhexidine Gluconate Cloth  6 each Topical Daily   clopidogrel  75 mg Oral Daily   folic acid  1 mg Oral Daily   gabapentin  400 mg Oral TID   insulin aspart  0-20 Units Subcutaneous TID WC   insulin aspart  0-5 Units Subcutaneous QHS   insulin aspart  3 Units Subcutaneous TID WC   insulin glargine-yfgn  50 Units Subcutaneous Daily   nicotine  21 mg Transdermal Daily   olopatadine  1 drop Both Eyes BID   pantoprazole  40 mg Oral Daily   polyethylene glycol  17 g Oral Daily   senna  2 tablet Oral Daily   sertraline  150 mg Oral Daily   sodium chloride flush  3 mL Intravenous Q12H   sodium chloride flush  3 mL Intravenous Q12H   vitamin B-12  2,000 mcg Oral Daily   Warfarin - Pharmacist Dosing  Inpatient   Does not apply q1600    Continuous Infusions:    sodium chloride     sodium chloride 100 mL/hr at 09/19/21 1327   heparin 2,300 Units/hr (09/20/21 0712)     LOS: 3 days     Vernell Leep, MD,  FACP, [facility], [facility], [facility] (Care Management Physician Certified) [place]  To contact the attending provider between 7A-7P or the covering provider during after hours 7P-7A, please log into the web site [email or web address] and access using universal [place] password for that web site. If you do not have the password, please call the hospital operator.  09/20/2021, 10:22 AM

## 2021-09-20 NOTE — Progress Notes (Addendum)
[facility] [place]  Subjective  - right foot feels better and she is ambulating to the bathroom   Objective (!) 114/55 89 97.8 F (36.6 C) (Oral) 18 100%  Intake/Output Summary (Last 24 hours) at 09/20/2021 0458 Last data filed at 09/20/2021 0300 Gross per 24 hour  Intake 2813.62 ml  Output 352 ml  Net 2461.62 ml    Left PT doppler signal monophasic Minimal Distal foot continues to appear mottled, motor of toes and sensation grossly intact. Left groin soft without hematoma  Assessment/Planning: POD # 2 status post thrombolysis of left lower extremity for acute on chronic limb ischemia.   Coumadin, ASA 81mg  and Plavix Monophasic posterior tibial artery signal.Imaging demonstrates inline flow to the mid peroneal artery with significant collaterals present. Patient out of rest pain.   Vascular surgical hx: 02/24/2017 - left lower extremity angiogram, stenting left common iliac artery (8 x 59 mm iCast), open thromboembolectomy via femoral, popliteal and posterior tibial exposures by Dr. Donzetta Matters.   11/14/2017 - left lower extremity angiogram via right common femoral access Dr. Donzetta Matters   02/25/2018 - redo open iliofemoral and popliteal/tibial embolectomy via left femoral exposure by Dr. Bridgett Larsson   03/03/2018 - exploration and debridement of infected left groin wound by Dr. Scot Dock   05/12/2018 - mechanical thrombectomy of left common iliac, left femoral, left popliteal, left anterior tibial artery with penumbra device by Drs. Brabham and Clark   3/27-28/2022 arterial thrombolysis; mechanical thrombectomy of popliteal, tibioperoneal trunk, peroneal and anterior tibial arteries and cat 3 mechanical thrombectomy of anterior tibial and peroneal arteries  Roxy Horseman 09/20/2021 4:58 AM -- VASCULAR STAFF ADDENDUM: I have independently interviewed and examined the patient. I agree with the above.  Pt out of rest pain. Foot looks better on exam today PT  signal OOB, will have PT today Transitioning back to warfarin, needs social work tomorrow as pt is homeless  J. Melene Muller, MD [facility] Phone Number: [phone] 09/20/2021 11:13 AM    Laboratory Lab Results: Recent Labs    09/18/21 2011 09/19/21 0113  WBC 18.1* 16.5*  HGB 9.0* 9.4*  HCT 29.7* 29.9*  PLT 416* 393   BMET Recent Labs    09/18/21 0633 09/19/21 0113  NA 133* 130*  K 3.9 4.4  CL 103 101  CO2 21* 21*  GLUCOSE 226* 289*  BUN 12 11  CREATININE 1.22* 1.35*  CALCIUM 8.9 8.9    COAG Lab Results  Component Value Date   INR 1.9 (H) 09/19/2021   INR 1.8 (H) 09/17/2021   INR 1.2 (A) 02/17/2021   No results found for: PTT

## 2021-09-21 ENCOUNTER — Encounter (HOSPITAL_COMMUNITY): Payer: Self-pay | Admitting: Vascular Surgery

## 2021-09-21 DIAGNOSIS — R739 Hyperglycemia, unspecified: Secondary | ICD-10-CM | POA: Diagnosis not present

## 2021-09-21 DIAGNOSIS — U071 COVID-19: Secondary | ICD-10-CM | POA: Diagnosis not present

## 2021-09-21 DIAGNOSIS — N179 Acute kidney failure, unspecified: Secondary | ICD-10-CM | POA: Diagnosis not present

## 2021-09-21 DIAGNOSIS — I70229 Atherosclerosis of native arteries of extremities with rest pain, unspecified extremity: Secondary | ICD-10-CM | POA: Diagnosis not present

## 2021-09-21 LAB — BASIC METABOLIC PANEL
Anion gap: 9 (ref 5–15)
BUN: 25 mg/dL — ABNORMAL HIGH (ref 6–20)
CO2: 20 mmol/L — ABNORMAL LOW (ref 22–32)
Calcium: 8.9 mg/dL (ref 8.9–10.3)
Chloride: 103 mmol/L (ref 98–111)
Creatinine, Ser: 1.76 mg/dL — ABNORMAL HIGH (ref 0.44–1.00)
GFR, Estimated: 36 mL/min — ABNORMAL LOW (ref >60)
Glucose, Bld: 134 mg/dL — ABNORMAL HIGH (ref 70–99)
Potassium: 4.5 mmol/L (ref 3.5–5.1)
Sodium: 132 mmol/L — ABNORMAL LOW (ref 135–145)

## 2021-09-21 LAB — CBC
HCT: 25.3 % — ABNORMAL LOW (ref 36.0–46.0)
Hemoglobin: 7.5 g/dL — ABNORMAL LOW (ref 12.0–15.0)
MCH: 24.4 pg — ABNORMAL LOW (ref 26.0–34.0)
MCHC: 29.6 g/dL — ABNORMAL LOW (ref 30.0–36.0)
MCV: 82.1 fL (ref 80.0–100.0)
Platelets: 447 10*3/uL — ABNORMAL HIGH (ref 150–400)
RBC: 3.08 MIL/uL — ABNORMAL LOW (ref 3.87–5.11)
RDW: 17.5 % — ABNORMAL HIGH (ref 11.5–15.5)
WBC: 11.5 10*3/uL — ABNORMAL HIGH (ref 4.0–10.5)
nRBC: 0 % (ref 0.0–0.2)

## 2021-09-21 LAB — GLUCOSE, CAPILLARY
Glucose-Capillary: 117 mg/dL — ABNORMAL HIGH (ref 70–99)
Glucose-Capillary: 112 mg/dL — ABNORMAL HIGH (ref 70–99)
Glucose-Capillary: 91 mg/dL (ref 70–99)
Glucose-Capillary: 95 mg/dL (ref 70–99)

## 2021-09-21 LAB — D-DIMER, QUANTITATIVE: D-Dimer, Quant: 2.6 ug/mL-FEU — ABNORMAL HIGH (ref 0.00–0.50)

## 2021-09-21 LAB — PROTIME-INR
INR: 2.5 — ABNORMAL HIGH (ref 0.8–1.2)
Prothrombin Time: 27.1 seconds — ABNORMAL HIGH (ref 11.4–15.2)

## 2021-09-21 LAB — HEPARIN LEVEL (UNFRACTIONATED): Heparin Unfractionated: 0.64 IU/mL (ref 0.30–0.70)

## 2021-09-21 LAB — C-REACTIVE PROTEIN: CRP: 14.1 mg/dL — ABNORMAL HIGH (ref <1.0)

## 2021-09-21 LAB — FERRITIN: Ferritin: 14 ng/mL (ref 11–307)

## 2021-09-21 MED ORDER — LIP MEDEX EX OINT
TOPICAL_OINTMENT | CUTANEOUS | Status: DC | PRN
  Filled 2021-09-21: qty 7

## 2021-09-21 MED ORDER — WARFARIN SODIUM 2.5 MG PO TABS
2.5000 mg | ORAL_TABLET | Freq: Once | ORAL | Status: AC
  Administered 2021-09-21: 16:00:00 2.5 mg via ORAL
  Filled 2021-09-21: qty 1

## 2021-09-21 MED ORDER — ADULT MULTIVITAMIN W/MINERALS CH
1.0000 | ORAL_TABLET | Freq: Every day | ORAL | Status: DC
  Administered 2021-09-21 – 2021-09-26 (×6): 1 via ORAL
  Filled 2021-09-21 (×6): qty 1

## 2021-09-21 MED ORDER — GLUCERNA SHAKE PO LIQD
237.0000 mL | Freq: Three times a day (TID) | ORAL | Status: DC
  Administered 2021-09-21 – 2021-09-26 (×14): 237 mL via ORAL

## 2021-09-21 MED ORDER — LIVING WELL WITH DIABETES BOOK
Freq: Once | Status: AC
  Filled 2021-09-21: qty 1

## 2021-09-21 NOTE — Progress Notes (Signed)
Initial Nutrition Assessment  DOCUMENTATION CODES:   Obesity unspecified  INTERVENTION:   Glucerna Shake po TID, each supplement provides 220 kcal and 10 grams of protein  MVI with minerals daily  NUTRITION DIAGNOSIS:   Increased nutrient needs related to acute illness (COVID-19 and acute left critical lower limb ischemia) as evidenced by estimated needs.  GOAL:   Patient will meet greater than or equal to 90% of their needs  MONITOR:   PO intake, Supplement acceptance, Labs, Skin  REASON FOR ASSESSMENT:   Consult Diet education  ASSESSMENT:   45 yo female admitted with critical left lower limb ischemia; S/P angiogram. Incidentally found to be COVID positive. PMH includes chronic hypercoagulable state, s/p multiple thrombolysis, chronic Coumadin therapy, HTN, DM-2, HLD, anxiety, tobacco use, GERD.  Received consult for diet education. Provided "Plate Method" handout from the Academy of Nutrition and Dietetics. Patient had no questions or concerns about her diet. She typically eats small amounts throughout the day. For the past 2-3 months, she has not been eating as well d/t decreased appetite. Weight is down by 23 lbs over the past 3.5 months. Suspect current edema is masking additional weight loss. Discussed with patient the importance of adequate protein and calorie intake for healing and recovery. Patient requested Glucerna Shakes between meals. She was unable to eat her lunch today and wants a Glucerna shake instead. RN notified.   Labs reviewed. Na 132 CBG: 117-112  Medications reviewed and include folic acid, Novolog, Semglee, vitamin B-12.  Weight history reviewed. Patient has lost 9% of usual weight within the past 3.5 months.   NUTRITION - FOCUSED PHYSICAL EXAM:  Flowsheet Row Most Recent Value  Orbital Region No depletion  Upper Arm Region No depletion  Thoracic and Lumbar Region No depletion  Buccal Region No depletion  Temple Region No depletion  Clavicle  Bone Region No depletion  Clavicle and Acromion Bone Region No depletion  Scapular Bone Region No depletion  Dorsal Hand No depletion  Patellar Region No depletion  Anterior Thigh Region No depletion  Posterior Calf Region No depletion  Edema (RD Assessment) Mild  Hair Reviewed  Eyes Reviewed  Mouth Reviewed  Skin Reviewed  Nails Reviewed       Diet Order:   Diet Order             Diet heart healthy/carb modified Room service appropriate? Yes with Assist; Fluid consistency: Thin  Diet effective now                   EDUCATION NEEDS:   Education needs have been addressed  Skin:  Skin Assessment: Reviewed RN Assessment  Last BM:  12/18  Height:   Ht Readings from Last 1 Encounters:  09/17/21 5\' 7"  (1.702 m)    Weight:   Wt Readings from Last 1 Encounters:  09/17/21 114.3 kg    BMI:  Body mass index is 39.47 kg/m.  Estimated Nutritional Needs:   Kcal:  1900-2200  Protein:  130-160 gm  Fluid:  1.9-2.2 L    Lucas Mallow, RD, LDN, CNSC Please refer to Amion for contact information.

## 2021-09-21 NOTE — Progress Notes (Signed)
Inpatient Diabetes Program Recommendations  AACE/ADA: New Consensus Statement on Inpatient Glycemic Control (2015)  Target Ranges:  Prepandial:   less than 140 mg/dL      Peak postprandial:   less than 180 mg/dL (1-2 hours)      Critically ill patients:  140 - 180 mg/dL   Lab Results  Component Value Date   GLUCAP 112 (H) 09/21/2021   HGBA1C 10.6 (H) 09/17/2021    Review of Glycemic Control  Latest Reference Range & Units 09/20/21 17:03 09/20/21 21:16 09/21/21 06:33 09/21/21 12:09  Glucose-Capillary 70 - 99 mg/dL 106 (H) 100 (H) 117 (H) 112 (H)  (H): Data is abnormally high  Diabetes history: DM2 Outpatient Diabetes medications: Lantus 65 units QD, Novolog 5-15 units TID  Current orders for Inpatient glycemic control: Semglee 50 units QD, Novolog 3 units MCTID, Novolog 0-20 units TID and 0-5 units QHS  Spoke with patient at bedside.  Reviewed patient's current A1c of 10.6% (average blood sugar of 257).   Explained what a A1c is and what it measures. Also reviewed goal A1c with patient, importance of good glucose control @ home, and blood sugar goals.  She denies difficulties obtaining medications.  She has Medicaid for insurance.  She does admit to skipping doses occasionally.  She does not drink beverages with sugar.  She has not been checking her BS at home because she is out of strips.  She is interested in a [employer].  Will ask MD for order to place prior to DC.  Denies hypoglycemia and is aware of signs, symptoms and treatment.    She states her boyfriend of 14 yrs has told her she cannot return to his house once discharged.  She is currently trying to find a friend to stay with at discharge.  Ordered LWWD and placed a dietician consult per patients request.   For discharge please consider prescriptions for:  Glucometer and supplies-order# [id number] Freestyle Libre-order# [id number]  Will follow up with patient prior to discharge.    Will continue to follow while  inpatient.  Thank you, Reche Dixon, RN, BSN Diabetes Coordinator Inpatient Diabetes Program [phone] (team pager from 8a-5p)

## 2021-09-21 NOTE — Progress Notes (Addendum)
°  Progress Note    09/21/2021 7:47 AM 3 Days Post-Op  Subjective:  Pain in L foot is much better than before revascularization   Vitals:   09/21/21 0048 09/21/21 0313  BP: (!) 101/50 102/62  Pulse: 88 89  Resp: 14 16  Temp: 97.8 F (36.6 C) 98.1 F (36.7 C)  SpO2: 98% 96%   Physical Exam: Lungs:  non labored Incisions:  R groin cath site without hematoma Extremities:  monophasic L PTA Neurologic: A&O  CBC    Component Value Date/Time   WBC 11.5 (H) 09/21/2021 0446   RBC 3.08 (L) 09/21/2021 0446   HGB 7.5 (L) 09/21/2021 0446   HGB 11.8 07/21/2021 1515   HCT 25.3 (L) 09/21/2021 0446   HCT 36.8 07/21/2021 1515   PLT 447 (H) 09/21/2021 0446   PLT 439 07/21/2021 1515   MCV 82.1 09/21/2021 0446   MCV 80 07/21/2021 1515   MCH 24.4 (L) 09/21/2021 0446   MCHC 29.6 (L) 09/21/2021 0446   RDW 17.5 (H) 09/21/2021 0446   RDW 16.2 (H) 07/21/2021 1515   LYMPHSABS 2.9 09/17/2021 0406   LYMPHSABS 3.1 01/21/2020 1603   MONOABS 0.9 09/17/2021 0406   EOSABS 0.5 09/17/2021 0406   EOSABS 0.4 01/21/2020 1603   BASOSABS 0.1 09/17/2021 0406   BASOSABS 0.1 01/21/2020 1603    BMET    Component Value Date/Time   NA 132 (L) 09/21/2021 0446   NA 138 07/21/2021 1515   K 4.5 09/21/2021 0446   CL 103 09/21/2021 0446   CO2 20 (L) 09/21/2021 0446   GLUCOSE 134 (H) 09/21/2021 0446   BUN 25 (H) 09/21/2021 0446   BUN 13 07/21/2021 1515   CREATININE 1.76 (H) 09/21/2021 0446   CREATININE 1.88 (H) 02/19/2020 1555   CALCIUM 8.9 09/21/2021 0446   GFRNONAA 36 (L) 09/21/2021 0446   GFRNONAA 32 (L) 02/19/2020 1555   GFRAA 30 (L) 07/29/2020 1528   GFRAA 37 (L) 02/19/2020 1555    INR    Component Value Date/Time   INR 2.5 (H) 09/21/2021 0446     Intake/Output Summary (Last 24 hours) at 09/21/2021 0747 Last data filed at 09/21/2021 0600 Gross per 24 hour  Intake 2281.57 ml  Output --  Net 2281.57 ml     Assessment/Plan:  45 y.o. female is s/p thrombolysis with L CIA stent,  profunda thrombectomy, popliteal thrombectomy and stenting, and peroneal stenting 3 Days Post-Op   Monophasic doppler signal of L PTA R groin cath site without hematoma INR therapeutic; ok to d/c heparin TOC to discuss homeless resources with the patient; possible d/c tomorrow, recheck INR   Dagoberto Ligas, PA-C [facility] [phone] 09/21/2021 7:47 AM  I have independently interviewed and examined patient and agree with PA assessment and plan above. Her lateral foot signal is present with some discoloration of first three toes on the left but sensation and motor is in tact. Plan is d/c when she has a place to stay.  Kimmora Risenhoover C. Donzetta Matters, MD [facility] [place] Office: [phone] Pager: [phone]

## 2021-09-21 NOTE — Progress Notes (Addendum)
[facility] for heparin + warfarin Indication: LLE ischemia  Allergies  Allergen Reactions   Ciprofloxacin Hcl Hives   Dilaudid [Hydromorphone Hcl] Shortness Of Breath and Other (See Comments)    "Asystole," per pt report   Macrobid [Nitrofurantoin Macrocrystal] Hives, Shortness Of Breath and Rash   Other Hives, Shortness Of Breath and Rash    NO "-CILLINS"!!!   Penicillins Shortness Of Breath    Had had cephalosporins without incident Has patient had a PCN reaction causing immediate rash, facial/tongue/throat swelling, SOB or lightheadedness with hypotension: Yes Has patient had a PCN reaction causing severe rash involving mucus membranes or skin necrosis: Unk Has patient had a PCN reaction that required hospitalization: Unk Has patient had a PCN reaction occurring within the last 10 years: No If all of the above answers are "NO", then may proceed with Cephalosporin use.    Sulfa Antibiotics Hives, Shortness Of Breath and Rash   Lexapro [Escitalopram Oxalate] Other (See Comments)    "I just did not like it."    Patient Measurements: Height: 5\' 7"  (170.2 cm) Weight: 114.3 kg (252 lb) IBW/kg (Calculated) : 61.6 Heparin Dosing Weight: 88.2 kg  Vital Signs: Temp: 97.8 F (36.6 C) (12/19 0832) Temp Source: Oral (12/19 0832) BP: 119/72 (12/19 0832) Pulse Rate: 87 (12/19 0832)  Labs: Recent Labs    09/19/21 0113 09/19/21 1151 09/20/21 0726 09/20/21 0736 09/20/21 1622 09/21/21 0446  HGB 9.4*  --  8.9*  --   --  7.5*  HCT 29.9*  --  29.3*  --   --  25.3*  PLT 393  --  435*  --   --  447*  LABPROT 22.2*  --  20.6*  --   --  27.1*  INR 1.9*  --  1.8*  --   --  2.5*  HEPARINUNFRC <0.10*   < >  --  0.18* 0.48 0.64  CREATININE 1.35*  --  1.57*  --   --  1.76*   < > = values in this interval not displayed.     Estimated Creatinine Clearance: 52.1 mL/min (A) (by C-G formula based on SCr of 1.76 mg/dL (H)).   Medical History: Past  Medical History:  Diagnosis Date   Anxiety    Depression    Diabetes ([place])    Type 2   GERD (gastroesophageal reflux disease)    History of blood clots    ,DVT-left leg, and early 2000, had blood clot behind left breast   History of kidney stones    Hypercalcemia    Hyperparathyroidism    Hypertension    had been on Lisinopril and PCP took her off it 4 months ago   Iron deficiency anemia    Left thyroid nodule    Mass of right ovary    Nephrolithiasis    PAOD (peripheral arterial occlusive disease) (HCC)    Peripheral vascular disease (HCC)    Prolonged Q-T interval on ECG 04/19/2019   Renal calculi    Substance abuse (HCC)    Tooth loose    top tooth from the right  is loose     Medications: see MAR  Assessment:  45 yo F with LLE ischemia - no pedal or posterior tibial pulse. Cap refill is > 3 secs. Pt on warfarin PTA for extensive vascular hx including DVT and arterial issues. Heparin was initiated and pt underwent peripheral lysis.  Heparin level therapeutic at upper end of goal 0.64. INR also up to  goal at 2.5. Hgb down 8.9 to 7.5 this am. No hematoma or bleeding noted. Will d/c heparin and watch INR closely.   *Home warfarin dose = 5mg  MWFSun, 10mg  AODs  Goal of Therapy: INR goal 2-3 Heparin level 0.3-0.7 units/ml Monitor platelets by anticoagulation protocol: Yes   Plan:  Stop heparin this morning. Warfarin 2.5mg  tonight - low threshold to decrease dose at discharge is INR trends up further in am Continue daily CBC and INR  Erin Hearing PharmD., BCPS Clinical Pharmacist 09/21/2021 8:46 AM

## 2021-09-21 NOTE — TOC Initial Note (Addendum)
Transition of Care [facility]) - Initial/Assessment Note    Patient Details  Name: Terri Sparks MRN: [id number] Date of Birth: 1976-05-24  [facility] CM/SW Contact:    Vinie Sill, LCSW Phone Number: 09/21/2021, 1:45 PM  Clinical Narrative:                  CSW spoke with patient via phone( due to patient covid +). CSW introduced self and explained role. Patient states she has been staying at [facility] since 10/22 with her significant other,however, she states they are no longer together. Patient states no family she can stay with at this time. She has a friend, that is considering to let her temporarily stay and should let her know by Wed. 12/21.  Patient reports she has applied for Section 8 Housing. She receives [place] 3rd of each month. Have no transportation.   CSW discussed PT recommendation of short term rehab at [facility]. Patient states she is agreeable to short term rehab at [facility]. CSW explained the SNF process, possible placement barriers and encourage that she continues to explore other options. Patient states understanding. CSW sent SNF referrals, waiting on response.   Will need PT to continue to work with patient-maybe difficult to place  Patient states occasionally drinks alcohol, last used cocaine in June and uses marijuana for anxiety and pain. Patient declined SA resources.    CSW will continue to follow and assist with discharge planning.   Expected Discharge Plan: Homeless Shelter Barriers to Discharge: Ship broker, SNF Pending bed offer, Homeless with medical needs   Patient Goals and CMS Choice        Expected Discharge Plan and Services Expected Discharge Plan: Homeless Shelter In-house Referral: Clinical Social Work     Living arrangements for the past 2 months: Hotel/Motel                                      Prior Living Arrangements/Services Living arrangements for the past 2 months:  Hotel/Motel Lives with:: Self, Significant Other Patient language and need for interpreter reviewed:: No              Criminal Activity/Legal Involvement Pertinent to Current Situation/Hospitalization: No - Comment as needed  Activities of Daily Living Home Assistive Devices/Equipment: None ADL Screening (condition at time of admission) Patient's cognitive ability adequate to safely complete daily activities?: Yes Is the patient deaf or have difficulty hearing?: No Does the patient have difficulty seeing, even when wearing glasses/contacts?: No Does the patient have difficulty concentrating, remembering, or making decisions?: No Patient able to express need for assistance with ADLs?: Yes Does the patient have difficulty dressing or bathing?: Yes Independently performs ADLs?: Yes (appropriate for developmental age) Does the patient have difficulty walking or climbing stairs?: No Weakness of Legs: None Weakness of Arms/Hands: None  Permission Sought/Granted                  Emotional Assessment   Attitude/Demeanor/Rapport: Unable to Assess (assessment completed by phone) Affect (typically observed): Pleasant, Appropriate Orientation: : Oriented to Self, Oriented to Place, Oriented to  Time, Oriented to Situation Alcohol / Substance Use: Not Applicable Psych Involvement: No (comment)  Admission diagnosis:  Hyperglycemia [R73.9] Left leg pain [M79.605] Critical lower limb ischemia ([place]) [I70.229] COVID-19 [U07.1] Patient Active Problem List   Diagnosis Date Noted   Critical lower limb ischemia (  [place]) 09/17/2021   Major depressive disorder, recurrent episode with anxious distress ([place]) 02/17/2021   Class 3 severe obesity due to excess calories with serious comorbidity and body mass index (BMI) of 40.0 to 44.9 in adult [facility]) 02/17/2021   Hypercoagulable state ([place]) 02/17/2021   Hyperlipidemia associated with type 2 diabetes mellitus ([place]) 01/08/2021   Critical limb ischemia  with history of revascularization of same extremity ([place]) 12/27/2020   Cocaine use disorder, moderate, dependence ([place]) 07/16/2020   Cannabis use disorder, moderate, dependence ([place]) 07/16/2020   Homeless 11/13/2019   Chest pain at rest 09/24/2019   Polysubstance abuse ([place]) 09/23/2019   Stage 3b chronic kidney disease ([place]) 07/30/2019   Iron deficiency anemia due to chronic blood loss 06/12/2019   Vitamin B12 deficiency 06/12/2019   Folate deficiency 06/12/2019   Constipation 06/12/2019   Prolonged QT interval    HTN (hypertension) 06/26/2018   Subtherapeutic international normalized ratio (INR) 06/26/2018   Adrenal nodule ([place]) 06/21/2018   Lung nodules 06/21/2018   Abnormal uterine bleeding (AUB) 05/14/2018   Ischemia of extremity 05/12/2018   Former smoker 03/14/2018   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 [place] [address] Phone: [phone] Fax: [phone]  [facility] [place], [place] - [address] [place] [address] Phone: [phone] Fax: [place], [place] [address] [place] [address] Phone: [phone] Fax: [place], [place] - [address] [place]  [place] [address] Phone: [phone] Fax: [phone]     Social Determinants of Health (SDOH) Interventions    Readmission Risk Interventions Readmission Risk Prevention Plan 01/04/2021  Transportation Screening Complete  Medication Review ([place]) Complete  PCP or Specialist appointment within 3-5 days of discharge Complete  HRI or [place] Complete  SW Recovery Care/Counseling Consult Complete  [place] Not Applicable  Some recent data might be hidden

## 2021-09-21 NOTE — Progress Notes (Signed)
PROGRESS NOTE   Terri Sparks  UYQ:[id number]    DOB: [birth year]    DOA: 09/17/2021  PCP: Ladell Pier, MD   I have briefly reviewed patients previous medical records in [facility].  Chief Complaint  Patient presents with   Leg Pain    Brief Narrative:  45 year old female with medical history significant for chronic hypercoagulable state with multiple arterial thrombosis and embolization, s/p multiple thrombolysis, on chronic Coumadin therapy, HTN, type II DM/IDDM, HLD, anxiety/depression, tobacco use disorder, GERD, presented with worsening left foot pain and discoloration.  Admitted by vascular surgery for acute ischemic left lower extremity for which he underwent left lower extremity angiogram with initiation of thrombolysis.  TRH were consulted for evaluation and management of incidentally positive COVID-19 PCR testing.  Has remained stable from a COVID-19 perspective.  Assisted with better control of DM.  Approaching stability for discharge, disposition to be determined.  PT recommends SNF, TOC consulted.   Assessment & Plan:  Principal Problem:   Critical lower limb ischemia ([place])   COVID-19 infection, incidental: Her symptoms reportedly started about 7 days prior to admission.  Currently asymptomatic.  Stable without hypoxia on room air.  Inflammatory markers elevation likely related to leg ischemia and intervention rather than the COVID infection.  No specific treatment initiated.  Supportive care as needed.  Airborne and contact isolation.  Her COVID-19 RT-PCR was positive on 12/15.  Per policy, she will remain on isolation for 10 days and can come off isolation on 12/26.  Type II DM/IDDM, uncontrolled with hyperglycemia: A1c of 10.6 indicates very poor outpatient control, suspect due to diet and medication noncompliance because with even reduced dose of Lantus in the hospital, her glycemic control has been great.  Diabetes coordinator consulted-input still pending.   Changed diet from regular to diabetic diet.  Continue current resistant SSI.  Resumed home Lantus but at reduced dose of 50 units daily.  Added mealtime NovoLog 3 units 3 times daily.  Adjust insulins as needed.   Acute left lower extremity ischemia: S/p angiogram and initiation of thrombolysis on 12/15.  Management per vascular surgery.  Back to the OR 12/16, extensive thrombectomy, stenting, angioplasty of left lower extremity multiple vessels.  VVS follow-up appreciated, improved, no rest pain, high risk of limb loss over the next year, resumed triple therapy with warfarin, aspirin and Plavix and IV heparin bridge.  INR now is 2.5, therapeutic, IV heparin discontinued.  Per VVS, possible DC 12/20.  Essential hypertension: Controlled on amlodipine 5 Mg daily and carvedilol.  Tobacco use disorder: Cessation counseled.  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  Creatinine has fluctuated in the last 2 years.  Creatinine has fluctuated during this admission as well.  Up to 1.76 today from 1.57.  Did receive contrast for procedure during this admission.  Clinically euvolemic.  Avoid nephrotoxins.  Follow BMP in a.m.  Iron deficiency anemia, chronic anemia: Hemoglobin is dropped from 8.9-7.5 in the absence of overt bleeding.  Suspect lab error.  Follow CBC in a.m. and transfuse if hemoglobin is 7 g or less per  Asthma/COPD: Stable.  Homelessness: Patient reports that she was living with her fianc prior to admission but now has nowhere to go upon discharge.  PT recommends SNF.  TOC consulted and informed that she will be a difficult placement due to multiple barriers.  Moreover her COVID-19 positive status may be  an issue.  Constipation: Initiated bowel regimen.  Having BMs.  Nausea and vomiting, intermittent dysphagia: Patient reports that she had 2-3 episodes of  nonbloody emesis yesterday.  Also intermittently on for a long time has feeling of food getting stuck in the lower chest.  It appears that the symptoms are chronic.  Did place an order for barium swallow but in the absence of urgent indication, radiology recommends doing this as an outpatient due to COVID-19 positive status.  Outpatient follow-up with barium swallow.  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  Recommended frequent small meals, as needed antiemetics and PPI.  Body mass index is 39.47 kg/m./Morbid obesity.   DVT prophylaxis:   Anticoagulated on Coumadin.   Code Status: Full Code Family Communication: None at bedside. Disposition:  Status is: Inpatient         Consultants:   TRH are consultants.  Procedures:   As noted above  Antimicrobials:   None.   Subjective:  Reports that she had 2-3 episodes of nonbloody emesis yesterday.  No vomiting since yesterday afternoon.  Having BMs.  No abdominal pain.  Also indicates that she has occasional feeling of food getting stuck in the lower part of her chest.  Objective:   Vitals:   09/21/21 0048 09/21/21 0313 09/21/21 0832 09/21/21 1121  BP: (!) 101/50 102/62 119/72 120/83  Pulse: 88 89 87 70  Resp: 14 16 13 13   Temp: 97.8 F (36.6 C) 98.1 F (36.7 C) 97.8 F (36.6 C) 97.7 F (36.5 C)  TempSrc: Oral Oral Oral Oral  SpO2: 98% 96% 95% 99%  Weight:      Height:        General exam: Young female, moderately built and morbidly obese lying comfortably propped up in bed without distress.  Oral mucosa moist. Respiratory system: Clear to auscultation.  No increased work of breathing. Cardiovascular system: S1 and S2 heard, RRR.  No JVD, murmurs or pedal edema.  Telemetry personally reviewed: Sinus rhythm.  Telemetry discontinued today Gastrointestinal system: Abdomen is nondistended, soft and nontender. No organomegaly or masses felt. Normal bowel sounds heard. Central nervous  system: Alert and oriented. No focal neurological deficits. Extremities: Symmetric 5 x 5 power.  Right lower extremity unremarkable.  Left lower extremity looks pink and perfused, warm to touch, ,?  Feeble dorsalis pedis felt. Skin: No rashes, lesions or ulcers Psychiatry: Judgement and insight appear normal. Mood & affect appropriate.     Data Reviewed:   I have personally reviewed following labs and imaging studies   CBC: Recent Labs  Lab 09/17/21 0406 09/17/21 1638 09/19/21 0113 09/20/21 0726 09/21/21 0446  WBC 14.5*   < > 16.5* 12.9* 11.5*  NEUTROABS 10.1*  --   --   --   --   HGB 10.6*   < > 9.4* 8.9* 7.5*  HCT 33.6*   < > 29.9* 29.3* 25.3*  MCV 79.8*   < > 78.7* 80.9 82.1  PLT 507*   < > 393 435* 447*   < > = values in this interval not displayed.    Basic Metabolic Panel: Recent Labs  Lab 09/17/21 0406 09/18/21 0633 09/19/21 0113 09/20/21 0726 09/21/21 0446  NA 130* 133* 130* 133* 132*  K 4.1 3.9 4.4 4.6 4.5  CL 98 103 101 104 103  CO2 25 21* 21* 21* 20*  GLUCOSE 396* 226* 289* 137* 134*  BUN 13 12 11  21* 25*  CREATININE 1.67* 1.22* 1.35* 1.57* 1.76*  CALCIUM 9.3 8.9 8.9 9.0 8.9    Liver Function Tests: Recent Labs  Lab 09/17/21 0406  AST 12*  ALT 13  ALKPHOS 93  BILITOT 0.2*  PROT 7.3  ALBUMIN 2.9*    CBG: Recent Labs  Lab 09/20/21 2116 09/21/21 0633 09/21/21 1209  GLUCAP 100* 117* 112*    Microbiology Studies:   Recent Results (from the past 240 hour(s))  Resp Panel by RT-PCR (Flu A&B, Covid) Nasopharyngeal Swab     Status: Abnormal   Collection Time: 09/17/21 10:39 AM   Specimen: Nasopharyngeal Swab; Nasopharyngeal(NP) swabs in vial transport medium  Result Value Ref Range Status   SARS Coronavirus 2 by RT PCR POSITIVE (A) NEGATIVE Final    Comment: (NOTE) SARS-CoV-2 target nucleic acids are DETECTED.  The SARS-CoV-2 RNA is generally detectable in upper respiratory specimens during the acute phase of infection. Positive results  are indicative of the presence of the identified virus, but do not rule out bacterial infection or co-infection with other pathogens not detected by the test. Clinical correlation with patient history and other diagnostic information is necessary to determine patient infection status. The expected result is Negative.  Fact Sheet for Patients: EntrepreneurPulse.com.au  Fact Sheet for Healthcare Providers: IncredibleEmployment.be  This test is not yet approved or cleared by the [place] FDA and  has been authorized for detection and/or diagnosis of SARS-CoV-2 by FDA under an Emergency Use Authorization (EUA).  This EUA will remain in effect (meaning this test can be used) for the duration of  the COVID-19 declaration under Section 564(b)(1) of the A ct, 21 U.S.C. section 360bbb-3(b)(1), unless the authorization is terminated or revoked sooner.     Influenza A by PCR NEGATIVE NEGATIVE Final   Influenza B by PCR NEGATIVE NEGATIVE Final    Comment: (NOTE) The Xpert Xpress SARS-CoV-2/FLU/RSV plus assay is intended as an aid in the diagnosis of influenza from Nasopharyngeal swab specimens and should not be used as a sole basis for treatment. Nasal washings and aspirates are unacceptable for Xpert Xpress SARS-CoV-2/FLU/RSV testing.  Fact Sheet for Patients: EntrepreneurPulse.com.au  Fact Sheet for Healthcare Providers: IncredibleEmployment.be  This test is not yet approved or cleared by the [place] FDA and has been authorized for detection and/or diagnosis of SARS-CoV-2 by FDA under an Emergency Use Authorization (EUA). This EUA will remain in effect (meaning this test can be used) for the duration of the COVID-19 declaration under Section 564(b)(1) of the Act, 21 U.S.C. section 360bbb-3(b)(1), unless the authorization is terminated or revoked.  Performed at [facility],  [address],  [place], [place] [address]   MRSA Next Gen by PCR, Nasal     Status: None   Collection Time: 09/17/21  5:00 PM   Specimen: Nasal Mucosa; Nasal Swab  Result Value Ref Range Status   MRSA by PCR Next Gen NOT DETECTED NOT DETECTED Final    Comment: (NOTE) The GeneXpert MRSA Assay (FDA approved for NASAL specimens only), is one component of a comprehensive MRSA colonization surveillance program. It is not intended to diagnose MRSA infection nor to guide or monitor treatment for MRSA infections. Test performance is not FDA approved in patients less than 20 years old. Performed at [facility], [place] [address] [place],  [address]     Radiology Studies:  No results found.  Scheduled Meds:    amLODipine  5 mg Oral Daily   aspirin EC  81 mg Oral Daily   atorvastatin  40 mg  Oral Daily   buPROPion  150 mg Oral BID   carvedilol  3.125 mg Oral BID   Chlorhexidine Gluconate Cloth  6 each Topical Daily   clopidogrel  75 mg Oral Daily   folic acid  1 mg Oral Daily   gabapentin  400 mg Oral TID   insulin aspart  0-20 Units Subcutaneous TID WC   insulin aspart  0-5 Units Subcutaneous QHS   insulin aspart  3 Units Subcutaneous TID WC   insulin glargine-yfgn  50 Units Subcutaneous Daily   nicotine  21 mg Transdermal Daily   olopatadine  1 drop Both Eyes BID   pantoprazole  40 mg Oral Daily   polyethylene glycol  17 g Oral Daily   senna  2 tablet Oral Daily   sertraline  150 mg Oral Daily   sodium chloride flush  3 mL Intravenous Q12H   sodium chloride flush  3 mL Intravenous Q12H   vitamin B-12  2,000 mcg Oral Daily   warfarin  2.5 mg Oral ONCE-1600   Warfarin - Pharmacist Dosing Inpatient   Does not apply q1600    Continuous Infusions:    sodium chloride       LOS: 4 days     Vernell Leep, MD,  FACP, [facility], [facility], [facility] (Care Management Physician Certified) [place]  To contact the attending provider between 7A-7P or the  covering provider during after hours 7P-7A, please log into the web site [email or web address] and access using universal [place] password for that web site. If you do not have the password, please call the hospital operator.  09/21/2021, 12:44 PM

## 2021-09-22 DIAGNOSIS — U071 COVID-19: Secondary | ICD-10-CM | POA: Diagnosis not present

## 2021-09-22 DIAGNOSIS — N179 Acute kidney failure, unspecified: Secondary | ICD-10-CM | POA: Diagnosis not present

## 2021-09-22 DIAGNOSIS — I70229 Atherosclerosis of native arteries of extremities with rest pain, unspecified extremity: Secondary | ICD-10-CM | POA: Diagnosis not present

## 2021-09-22 DIAGNOSIS — R739 Hyperglycemia, unspecified: Secondary | ICD-10-CM | POA: Diagnosis not present

## 2021-09-22 LAB — PREPARE RBC (CROSSMATCH)

## 2021-09-22 LAB — PROTIME-INR
INR: 2.3 — ABNORMAL HIGH (ref 0.8–1.2)
Prothrombin Time: 25.4 seconds — ABNORMAL HIGH (ref 11.4–15.2)

## 2021-09-22 LAB — GLUCOSE, CAPILLARY
Glucose-Capillary: 150 mg/dL — ABNORMAL HIGH (ref 70–99)
Glucose-Capillary: 71 mg/dL (ref 70–99)
Glucose-Capillary: 111 mg/dL — ABNORMAL HIGH (ref 70–99)
Glucose-Capillary: 100 mg/dL — ABNORMAL HIGH (ref 70–99)

## 2021-09-22 LAB — FOLATE: Folate: 13.6 ng/mL (ref >5.9)

## 2021-09-22 LAB — BASIC METABOLIC PANEL
Anion gap: 8 (ref 5–15)
BUN: 24 mg/dL — ABNORMAL HIGH (ref 6–20)
CO2: 23 mmol/L (ref 22–32)
Calcium: 9.1 mg/dL (ref 8.9–10.3)
Chloride: 104 mmol/L (ref 98–111)
Creatinine, Ser: 1.78 mg/dL — ABNORMAL HIGH (ref 0.44–1.00)
GFR, Estimated: 35 mL/min — ABNORMAL LOW (ref >60)
Glucose, Bld: 166 mg/dL — ABNORMAL HIGH (ref 70–99)
Potassium: 4.9 mmol/L (ref 3.5–5.1)
Sodium: 135 mmol/L (ref 135–145)

## 2021-09-22 LAB — D-DIMER, QUANTITATIVE: D-Dimer, Quant: 1 ug/mL-FEU — ABNORMAL HIGH (ref 0.00–0.50)

## 2021-09-22 LAB — CBC
HCT: 22.7 % — ABNORMAL LOW (ref 36.0–46.0)
Hemoglobin: 7 g/dL — ABNORMAL LOW (ref 12.0–15.0)
MCH: 25.2 pg — ABNORMAL LOW (ref 26.0–34.0)
MCHC: 30.8 g/dL (ref 30.0–36.0)
MCV: 81.7 fL (ref 80.0–100.0)
Platelets: 446 10*3/uL — ABNORMAL HIGH (ref 150–400)
RBC: 2.78 MIL/uL — ABNORMAL LOW (ref 3.87–5.11)
RDW: 17.2 % — ABNORMAL HIGH (ref 11.5–15.5)
WBC: 14.5 10*3/uL — ABNORMAL HIGH (ref 4.0–10.5)
nRBC: 0 % (ref 0.0–0.2)

## 2021-09-22 LAB — VITAMIN B12: Vitamin B-12: 685 pg/mL (ref 180–914)

## 2021-09-22 LAB — HEMOGLOBIN AND HEMATOCRIT, BLOOD
HCT: 24.3 % — ABNORMAL LOW (ref 36.0–46.0)
Hemoglobin: 7.5 g/dL — ABNORMAL LOW (ref 12.0–15.0)

## 2021-09-22 LAB — FERRITIN: Ferritin: 46 ng/mL (ref 11–307)

## 2021-09-22 LAB — C-REACTIVE PROTEIN: CRP: 13.2 mg/dL — ABNORMAL HIGH (ref <1.0)

## 2021-09-22 MED ORDER — INSULIN GLARGINE-YFGN 100 UNIT/ML ~~LOC~~ SOLN
45.0000 [IU] | Freq: Every day | SUBCUTANEOUS | Status: DC
  Administered 2021-09-23 – 2021-09-26 (×4): 45 [IU] via SUBCUTANEOUS
  Filled 2021-09-22 (×4): qty 0.45

## 2021-09-22 MED ORDER — WARFARIN SODIUM 5 MG PO TABS
5.0000 mg | ORAL_TABLET | Freq: Once | ORAL | Status: AC
  Administered 2021-09-22: 17:00:00 5 mg via ORAL
  Filled 2021-09-22: qty 1

## 2021-09-22 MED ORDER — ALUM & MAG HYDROXIDE-SIMETH 200-200-20 MG/5ML PO SUSP
30.0000 mL | ORAL | Status: DC | PRN
  Administered 2021-09-22: 05:00:00 30 mL via ORAL
  Filled 2021-09-22 (×2): qty 30

## 2021-09-22 NOTE — Consult Note (Signed)
[facility] Inpatient Consult   09/22/2021  Terri Sparks [birth year] [id number]  [place] [facility] CM)   Patient chart reviewed with noted extreme high unplanned readmission risk score. Patient eligible for chronic disease management and care coordination services with Managed Medicaid chronic care management team. No prior enrollment noted.  Plan: Will continue to follow for progression and disposition plans.   Of note, [facility] Care Management services does not replace or interfere with any services that are arranged by inpatient case management or social work.   Netta Cedars, MSN, RN [facility] [employer] [phone]  Toll free office [phone]

## 2021-09-22 NOTE — Progress Notes (Signed)
PROGRESS NOTE   LORRAINE CIMMINO  MAU:[id number]    DOB: [birth year]    DOA: 09/17/2021  PCP: Ladell Pier, MD   I have briefly reviewed patients previous medical records in [facility].  Chief Complaint  Patient presents with   Leg Pain    Brief Narrative:  45 year old female with medical history significant for chronic hypercoagulable state with multiple arterial thrombosis and embolization, s/p multiple thrombolysis, on chronic Coumadin therapy, HTN, type II DM/IDDM, HLD, anxiety/depression, tobacco use disorder, GERD, presented with worsening left foot pain and discoloration.  Admitted by vascular surgery for acute ischemic left lower extremity for which he underwent left lower extremity angiogram with initiation of thrombolysis.  TRH were consulted for evaluation and management of incidentally positive COVID-19 PCR testing.  Has remained stable from a COVID-19 perspective.  Assisted with better control of DM.  Approaching stability for discharge, disposition to be determined.  PT recommends SNF, TOC consulted.   Assessment & Plan:  Principal Problem:   Critical lower limb ischemia ([place])   COVID-19 infection, incidental: Her symptoms reportedly started about 7 days prior to admission.  Currently asymptomatic.  Stable without hypoxia on room air.  Inflammatory markers elevation likely related to leg ischemia and intervention rather than the COVID infection.  No specific treatment initiated.  Supportive care as needed.  Airborne and contact isolation.  Her COVID-19 RT-PCR was positive on 12/15.  Per policy, she will remain on isolation for 10 days and can come off isolation on 12/26.  Type II DM/IDDM, uncontrolled with hyperglycemia: A1c of 10.6 indicates very poor outpatient control, suspect due to diet and medication noncompliance because with even reduced dose of Lantus in the hospital, her glycemic control has been great.  Diabetes coordinator input appreciated.  Continue  current resistant SSI.  Resumed home Lantus but at reduced dose of 50 units daily.  Adjust insulins as needed.  CBGs fairly tightly controlled.  Per RN, not getting mealtime NovoLog-discontinued.  We will reduce Lantus to 45 units daily.  Acute left lower extremity ischemia: S/p angiogram and initiation of thrombolysis on 12/15.  Management per vascular surgery.  Back to the OR 12/16, extensive thrombectomy, stenting, angioplasty of left lower extremity multiple vessels.  VVS follow-up appreciated, improved, no rest pain, high risk of limb loss over the next year, resumed triple therapy with warfarin, aspirin and Plavix and IV heparin bridge.  INR now is 2.3, therapeutic, IV heparin discontinued.  Per VVS, possible DC pending appropriate disposition.  Essential hypertension: Controlled on amlodipine 5 Mg daily and carvedilol.  Tobacco use disorder: Cessation counseled.  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  Creatinine has fluctuated in the last 2 years.  Creatinine has fluctuated during this admission as well.  Up to 1.76 today from 1.57.  Did receive contrast for procedure during this admission.  Clinically euvolemic.  Avoid nephrotoxins.  Creatinine plateaued in the 1.7 range.  Follow BMP in a.m.  Iron deficiency anemia, chronic anemia: Hemoglobin has steadily dropped during course of hospitalization from 10.6 g on admission to 7 g today in the absence of overt bleeding.  Patient currently having her menstrual periods and reports history of menorrhagia (menses every 3 weeks, lasting up to 7 days, may have clots).  Agree with transfusing 1 unit of PRBC.  Follow CBC in AM.  Can consider iron supplements at time of discharge and recommend outpatient  GYN consultation to address menorrhagia.  Asthma/COPD: Stable.  Homelessness: Patient reports that she was living with  her fianc prior to admission but now has nowhere to go upon discharge.  PT recommends SNF.  TOC consulted and informed that she will be a difficult placement due to multiple barriers.  Moreover her COVID-19 positive status may be an issue.  Per patient, a friend may be able to accommodate her and she will know definitively tomorrow.  Constipation: Initiated bowel regimen.  Having BMs.  Nausea and vomiting, intermittent dysphagia: Patient reports that she had 2-3 episodes of nonbloody emesis 11/18.  Also intermittently on for a long time has feeling of food getting stuck in the lower chest.  It appears that the symptoms are chronic.  Did place an order for barium swallow but in the absence of urgent indication, radiology recommends doing this as an outpatient due to COVID-19 positive status.  Outpatient follow-up with barium swallow.  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  Recommended frequent small meals, as needed antiemetics and PPI.  Body mass index is 39.47 kg/m./Morbid obesity.   DVT prophylaxis:   Anticoagulated on Coumadin.   Code Status: Full Code Family Communication: None at bedside. Disposition:  Status is: Inpatient    Patient may be stable for DC 12/21.      Consultants:   TRH are consultants.  Procedures:   As noted above  Antimicrobials:   None.   Subjective:  "I do not feel good" but unwilling to elaborate despite asking her to.  Having her menses, day 3 today with some clots.  No dizziness, lightheadedness, chest pain, nausea, vomiting reported.  Objective:   Vitals:   09/21/21 2026 09/21/21 2345 09/22/21 0458 09/22/21 0748  BP: 109/62 112/73 112/65 114/62  Pulse: 88 91 95 94  Resp: 14 16 17 17   Temp: 97.8 F (36.6 C) 97.8 F (36.6 C) 97.7 F (36.5 C) 97.6 F (36.4 C)  TempSrc: Oral Oral Oral Oral  SpO2: 90% 90% 90% 98%  Weight:      Height:        General exam: Young female, moderately built and  morbidly obese lying comfortably propped up in bed without distress.  Oral mucosa moist. Respiratory system: Clear to auscultation.  No increased work of breathing. Cardiovascular system: S1 and S2 heard, RRR.  No JVD, murmurs or pedal edema.  Telemetry personally reviewed: Sinus rhythm.  Remains on telemetry despite order to discontinue on 11/19. Gastrointestinal system: Abdomen is nondistended, soft and nontender. No organomegaly or masses felt. Normal bowel sounds heard. Central nervous system: Alert and oriented. No focal neurological deficits. Extremities: Symmetric 5 x 5 power.  Right lower extremity unremarkable.  Left lower extremity looks pink and perfused, warm to touch, ,?  Feeble dorsalis pedis felt. Skin: No rashes, lesions or ulcers Psychiatry: Judgement and insight appear normal. Mood & affect appropriate.     Data Reviewed:   I have personally reviewed following labs and imaging studies   CBC: Recent Labs  Lab 09/17/21 0406 09/17/21 1638 09/20/21 0726 09/21/21 0446 09/22/21 0607  WBC 14.5*   < > 12.9* 11.5* 14.5*  NEUTROABS 10.1*  --   --   --   --   HGB 10.6*   < > 8.9* 7.5* 7.0*  HCT 33.6*   < > 29.3* 25.3* 22.7*  MCV 79.8*   < > 80.9 82.1 81.7  PLT 507*   < > 435* 447* 446*   < > =  values in this interval not displayed.    Basic Metabolic Panel: Recent Labs  Lab 09/18/21 0633 09/19/21 0113 09/20/21 0726 09/21/21 0446 09/22/21 0607  NA 133* 130* 133* 132* 135  K 3.9 4.4 4.6 4.5 4.9  CL 103 101 104 103 104  CO2 21* 21* 21* 20* 23  GLUCOSE 226* 289* 137* 134* 166*  BUN 12 11 21* 25* 24*  CREATININE 1.22* 1.35* 1.57* 1.76* 1.78*  CALCIUM 8.9 8.9 9.0 8.9 9.1    Liver Function Tests: Recent Labs  Lab 09/17/21 0406  AST 12*  ALT 13  ALKPHOS 93  BILITOT 0.2*  PROT 7.3  ALBUMIN 2.9*    CBG: Recent Labs  Lab 09/21/21 1628 09/21/21 2121 09/22/21 0641  GLUCAP 91 95 150*    Microbiology Studies:   Recent Results (from the past 240  hour(s))  Resp Panel by RT-PCR (Flu A&B, Covid) Nasopharyngeal Swab     Status: Abnormal   Collection Time: 09/17/21 10:39 AM   Specimen: Nasopharyngeal Swab; Nasopharyngeal(NP) swabs in vial transport medium  Result Value Ref Range Status   SARS Coronavirus 2 by RT PCR POSITIVE (A) NEGATIVE Final    Comment: (NOTE) SARS-CoV-2 target nucleic acids are DETECTED.  The SARS-CoV-2 RNA is generally detectable in upper respiratory specimens during the acute phase of infection. Positive results are indicative of the presence of the identified virus, but do not rule out bacterial infection or co-infection with other pathogens not detected by the test. Clinical correlation with patient history and other diagnostic information is necessary to determine patient infection status. The expected result is Negative.  Fact Sheet for Patients: EntrepreneurPulse.com.au  Fact Sheet for Healthcare Providers: IncredibleEmployment.be  This test is not yet approved or cleared by the [place] FDA and  has been authorized for detection and/or diagnosis of SARS-CoV-2 by FDA under an Emergency Use Authorization (EUA).  This EUA will remain in effect (meaning this test can be used) for the duration of  the COVID-19 declaration under Section 564(b)(1) of the A ct, 21 U.S.C. section 360bbb-3(b)(1), unless the authorization is terminated or revoked sooner.     Influenza A by PCR NEGATIVE NEGATIVE Final   Influenza B by PCR NEGATIVE NEGATIVE Final    Comment: (NOTE) The Xpert Xpress SARS-CoV-2/FLU/RSV plus assay is intended as an aid in the diagnosis of influenza from Nasopharyngeal swab specimens and should not be used as a sole basis for treatment. Nasal washings and aspirates are unacceptable for Xpert Xpress SARS-CoV-2/FLU/RSV testing.  Fact Sheet for Patients: EntrepreneurPulse.com.au  Fact Sheet for Healthcare  Providers: IncredibleEmployment.be  This test is not yet approved or cleared by the [place] FDA and has been authorized for detection and/or diagnosis of SARS-CoV-2 by FDA under an Emergency Use Authorization (EUA). This EUA will remain in effect (meaning this test can be used) for the duration of the COVID-19 declaration under Section 564(b)(1) of the Act, 21 U.S.C. section 360bbb-3(b)(1), unless the authorization is terminated or revoked.  Performed at [facility], [place] [address] [place], [place] [address]   MRSA Next Gen by PCR, Nasal     Status: None   Collection Time: 09/17/21  5:00 PM   Specimen: Nasal Mucosa; Nasal Swab  Result Value Ref Range Status   MRSA by PCR Next Gen NOT DETECTED NOT DETECTED Final    Comment: (NOTE) The GeneXpert MRSA Assay (FDA approved for NASAL specimens only), is one component of a comprehensive MRSA colonization surveillance program. It is not intended  to diagnose MRSA infection nor to guide or monitor treatment for MRSA infections. Test performance is not FDA approved in patients less than 92 years old. Performed at [facility], [place] [address] [place], [place] [address]     Radiology Studies:  No results found.  Scheduled Meds:    amLODipine  5 mg Oral Daily   aspirin EC  81 mg Oral Daily   atorvastatin  40 mg Oral Daily   buPROPion  150 mg Oral BID   carvedilol  3.125 mg Oral BID   Chlorhexidine Gluconate Cloth  6 each Topical Daily   clopidogrel  75 mg Oral Daily   feeding supplement (GLUCERNA SHAKE)  237 mL Oral TID BM   folic acid  1 mg Oral Daily   gabapentin  400 mg Oral TID   insulin aspart  0-20 Units Subcutaneous TID WC   insulin aspart  0-5 Units Subcutaneous QHS   insulin aspart  3 Units Subcutaneous TID WC   insulin glargine-yfgn  50 Units Subcutaneous Daily   multivitamin with minerals  1 tablet Oral Daily   nicotine  21 mg Transdermal Daily   olopatadine  1 drop Both Eyes BID    pantoprazole  40 mg Oral Daily   polyethylene glycol  17 g Oral Daily   senna  2 tablet Oral Daily   sertraline  150 mg Oral Daily   sodium chloride flush  3 mL Intravenous Q12H   sodium chloride flush  3 mL Intravenous Q12H   vitamin B-12  2,000 mcg Oral Daily   warfarin  5 mg Oral ONCE-1600   Warfarin - Pharmacist Dosing Inpatient   Does not apply q1600    Continuous Infusions:    sodium chloride       LOS: 5 days     Vernell Leep, MD,  FACP, [facility], [facility], [facility] (Care Management Physician Certified) [place]  To contact the attending provider between 7A-7P or the covering provider during after hours 7P-7A, please log into the web site [email or web address] and access using universal [place] password for that web site. If you do not have the password, please call the hospital operator.  09/22/2021, 11:33 AM

## 2021-09-22 NOTE — Progress Notes (Signed)
Physical Therapy Treatment Patient Details Name: Terri Sparks MRN: [id number] DOB: 05-05-1976 Today's Date: 09/22/2021   History of Present Illness 45 y.o. female presents to [facility] hospital on 09/17/2021 with worsening L foot pain and discoloration, admitted for acute ischemic left lower extremity. Pt underwent left lower extremity angiogram with initiation of thrombolysis on 09/17/2021. Pt underwent aortogram and trhombectomy on 12/16. 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    PT Comments    Patient further educated in use of RW: transfer and gait training. Distance limited by IV site beginning to bleed while walking. Patient demonstrated bed to chair transfer without a device with no imbalance noted. Patient is making excellent progress and anticipate she will not need follow-up PT upon discharge (whether to friend's home or to shelter). Will need RW.     Recommendations for follow up therapy are one component of a multi-disciplinary discharge planning process, led by the attending physician.  Recommendations may be updated based on patient status, additional functional criteria and insurance authorization.  Follow Up Recommendations  No PT follow up     Assistance Recommended at Discharge PRN  Equipment Recommendations  Rolling walker (2 wheels)    Recommendations for Other Services       Precautions / Restrictions Precautions Precautions: Fall     Mobility  Bed Mobility Overal bed mobility: Modified Independent             General bed mobility comments: supine to sit    Transfers Overall transfer level: Needs assistance Equipment used: Rolling walker (2 wheels) Transfers: Sit to/from Stand;Bed to chair/wheelchair/BSC Sit to Stand: Min guard     Step pivot transfers: Supervision     General transfer comment: cues for proper sequencing,  hand placementwith RW; bed to chair without device and no cues needed    Ambulation/Gait Ambulation/Gait assistance: Supervision Gait Distance (Feet): 40 Feet Assistive device: Rolling walker (2 wheels) Gait Pattern/deviations: Step-to pattern Gait velocity: reduced     General Gait Details: pt with slowed step-to gait, reduced stance time on LLE; at times leading with RLE and cued to lead with left to minimize pain when walking   Stairs             Wheelchair Mobility    Modified Rankin (Stroke Patients Only)       Balance Overall balance assessment: Needs assistance Sitting-balance support: No upper extremity supported;Feet supported Sitting balance-Leahy Scale: Good     Standing balance support: No upper extremity supported;During functional activity Standing balance-Leahy Scale: Fair                              Cognition Arousal/Alertness: Awake/alert Behavior During Therapy: WFL for tasks assessed/performed Overall Cognitive Status: Within Functional Limits for tasks assessed                                          Exercises      General Comments        Pertinent Vitals/Pain Pain Assessment: Faces Faces Pain Scale: Hurts even more Pain Location: LLE Pain Descriptors / Indicators: Guarding Pain Intervention(s): Premedicated before session;Limited activity within patient's tolerance;Repositioned    Home Living  Prior Function            PT Goals (current goals can now be found in the care plan section) Acute Rehab PT Goals Patient Stated Goal: to go to rehab and regain independence in mobility PT Goal Formulation: With patient Time For Goal Achievement: 10/04/21 Potential to Achieve Goals: Good Progress towards PT goals: Progressing toward goals    Frequency    Min 3X/week (maintain 3x to progress mobility as pt may be difficult to place)      PT Plan Discharge plan  needs to be updated    Co-evaluation              AM-PAC PT "6 Clicks" Mobility   Outcome Measure  Help needed turning from your back to your side while in a flat bed without using bedrails?: None Help needed moving from lying on your back to sitting on the side of a flat bed without using bedrails?: None Help needed moving to and from a bed to a chair (including a wheelchair)?: A Little Help needed standing up from a chair using your arms (e.g., wheelchair or bedside chair)?: A Little Help needed to walk in hospital room?: A Little Help needed climbing 3-5 steps with a railing? : A Little 6 Click Score: 20    End of Session   Activity Tolerance: Other (comment) (limited by IV site bleeding) Patient left: with call bell/phone within reach;in chair;with nursing/sitter in room Nurse Communication: Mobility status;Other (comment) (IV site bleeding) PT Visit Diagnosis: Other abnormalities of gait and mobility (R26.89);Muscle weakness (generalized) (M62.81);Pain Pain - Right/Left: Left Pain - part of body: Leg;Ankle and joints of foot     Time: 5537-4827 PT Time Calculation (min) (ACUTE ONLY): 27 min  Charges:  $Gait Training: 23-37 mins                      Arby Barrette, [place]  Pager [phone] Office [phone]    Rexanne Mano 09/22/2021, 2:57 PM

## 2021-09-22 NOTE — Progress Notes (Signed)
PT Cancellation Note  Patient Details Name: Terri Sparks MRN: [id number] DOB: 10/06/1975   Cancelled Treatment:    Reason Eval/Treat Not Completed: Pain limiting ability to participate Pt reporting increased pain in LLE and reports she had "a lot going on", and requesting to hold on therapy until tomorrow. Educated about exercises to help with LLE cramping. Will follow up as schedule allows.   Lou Miner, DPT  Acute Rehabilitation Services  Pager: [phone] Office: [phone]  Rudean Hitt 09/22/2021, 9:37 AM

## 2021-09-22 NOTE — Progress Notes (Addendum)
°  Progress Note    09/22/2021 7:45 AM 4 Days Post-Op  Subjective:  no new complaints overnight   Vitals:   09/21/21 2345 09/22/21 0458  BP: 112/73 112/65  Pulse: 91 95  Resp: 16 17  Temp: 97.8 F (36.6 C) 97.7 F (36.5 C)  SpO2: 90% 90%   Physical Exam: Lungs:  non labored Incisions:  R groin without hematoma Extremities:  L PTA soft by doppler Neurologic: A&O  CBC    Component Value Date/Time   WBC 14.5 (H) 09/22/2021 0607   RBC 2.78 (L) 09/22/2021 0607   HGB 7.0 (L) 09/22/2021 0607   HGB 11.8 07/21/2021 1515   HCT 22.7 (L) 09/22/2021 0607   HCT 36.8 07/21/2021 1515   PLT 446 (H) 09/22/2021 0607   PLT 439 07/21/2021 1515   MCV 81.7 09/22/2021 0607   MCV 80 07/21/2021 1515   MCH 25.2 (L) 09/22/2021 0607   MCHC 30.8 09/22/2021 0607   RDW 17.2 (H) 09/22/2021 0607   RDW 16.2 (H) 07/21/2021 1515   LYMPHSABS 2.9 09/17/2021 0406   LYMPHSABS 3.1 01/21/2020 1603   MONOABS 0.9 09/17/2021 0406   EOSABS 0.5 09/17/2021 0406   EOSABS 0.4 01/21/2020 1603   BASOSABS 0.1 09/17/2021 0406   BASOSABS 0.1 01/21/2020 1603    BMET    Component Value Date/Time   NA 135 09/22/2021 0607   NA 138 07/21/2021 1515   K 4.9 09/22/2021 0607   CL 104 09/22/2021 0607   CO2 23 09/22/2021 0607   GLUCOSE 166 (H) 09/22/2021 0607   BUN 24 (H) 09/22/2021 0607   BUN 13 07/21/2021 1515   CREATININE 1.78 (H) 09/22/2021 0607   CREATININE 1.88 (H) 02/19/2020 1555   CALCIUM 9.1 09/22/2021 0607   GFRNONAA 35 (L) 09/22/2021 0607   GFRNONAA 32 (L) 02/19/2020 1555   GFRAA 30 (L) 07/29/2020 1528   GFRAA 37 (L) 02/19/2020 1555    INR    Component Value Date/Time   INR 2.3 (H) 09/22/2021 5183     Intake/Output Summary (Last 24 hours) at 09/22/2021 0745 Last data filed at 09/22/2021 4373 Gross per 24 hour  Intake 1200 ml  Output --  Net 1200 ml     Assessment/Plan:  45 y.o. female is s/p  thrombolysis with L CIA stent, profunda thrombectomy, popliteal thrombectomy and stenting,  and peroneal stenting 4 Days Post-Op   L PTA monophasic by doppler INR therapeutic; continue coumadin  Hgb 7; transfuse 1 unit pRBC TOC helping with placement; home when placement arranged   Dagoberto Ligas, PA-C [facility] [phone] 09/22/2021 7:45 AM  I have independently interviewed and examined patient and agree with PA assessment and plan above.   Oriel Rumbold C. Donzetta Matters, MD [facility] [place] Office: [phone] Pager: [phone]

## 2021-09-23 DIAGNOSIS — U071 COVID-19: Secondary | ICD-10-CM

## 2021-09-23 DIAGNOSIS — D500 Iron deficiency anemia secondary to blood loss (chronic): Secondary | ICD-10-CM

## 2021-09-23 DIAGNOSIS — I70229 Atherosclerosis of native arteries of extremities with rest pain, unspecified extremity: Secondary | ICD-10-CM | POA: Diagnosis not present

## 2021-09-23 DIAGNOSIS — N179 Acute kidney failure, unspecified: Secondary | ICD-10-CM | POA: Diagnosis not present

## 2021-09-23 DIAGNOSIS — N1831 Chronic kidney disease, stage 3a: Secondary | ICD-10-CM

## 2021-09-23 LAB — BASIC METABOLIC PANEL
Anion gap: 8 (ref 5–15)
Anion gap: 8 (ref 5–15)
BUN: 27 mg/dL — ABNORMAL HIGH (ref 6–20)
BUN: 26 mg/dL — ABNORMAL HIGH (ref 6–20)
CO2: 20 mmol/L — ABNORMAL LOW (ref 22–32)
CO2: 24 mmol/L (ref 22–32)
Calcium: 8.9 mg/dL (ref 8.9–10.3)
Calcium: 9.2 mg/dL (ref 8.9–10.3)
Chloride: 101 mmol/L (ref 98–111)
Chloride: 101 mmol/L (ref 98–111)
Creatinine, Ser: 1.84 mg/dL — ABNORMAL HIGH (ref 0.44–1.00)
Creatinine, Ser: 1.8 mg/dL — ABNORMAL HIGH (ref 0.44–1.00)
GFR, Estimated: 34 mL/min — ABNORMAL LOW (ref >60)
GFR, Estimated: 35 mL/min — ABNORMAL LOW (ref >60)
Glucose, Bld: 191 mg/dL — ABNORMAL HIGH (ref 70–99)
Glucose, Bld: 92 mg/dL (ref 70–99)
Potassium: 4.8 mmol/L (ref 3.5–5.1)
Potassium: 4.5 mmol/L (ref 3.5–5.1)
Sodium: 129 mmol/L — ABNORMAL LOW (ref 135–145)
Sodium: 133 mmol/L — ABNORMAL LOW (ref 135–145)

## 2021-09-23 LAB — CBC
HCT: 22.8 % — ABNORMAL LOW (ref 36.0–46.0)
Hemoglobin: 7.1 g/dL — ABNORMAL LOW (ref 12.0–15.0)
MCH: 25.4 pg — ABNORMAL LOW (ref 26.0–34.0)
MCHC: 31.1 g/dL (ref 30.0–36.0)
MCV: 81.4 fL (ref 80.0–100.0)
Platelets: 427 10*3/uL — ABNORMAL HIGH (ref 150–400)
RBC: 2.8 MIL/uL — ABNORMAL LOW (ref 3.87–5.11)
RDW: 16.9 % — ABNORMAL HIGH (ref 11.5–15.5)
WBC: 15.4 10*3/uL — ABNORMAL HIGH (ref 4.0–10.5)
nRBC: 0 % (ref 0.0–0.2)

## 2021-09-23 LAB — GLUCOSE, CAPILLARY
Glucose-Capillary: 215 mg/dL — ABNORMAL HIGH (ref 70–99)
Glucose-Capillary: 98 mg/dL (ref 70–99)
Glucose-Capillary: 111 mg/dL — ABNORMAL HIGH (ref 70–99)
Glucose-Capillary: 131 mg/dL — ABNORMAL HIGH (ref 70–99)

## 2021-09-23 LAB — IRON AND TIBC
Iron: 16 ug/dL — ABNORMAL LOW (ref 28–170)
Saturation Ratios: 4 % — ABNORMAL LOW (ref 10.4–31.8)
TIBC: 368 ug/dL (ref 250–450)
UIBC: 352 ug/dL

## 2021-09-23 LAB — PROTIME-INR
INR: 2.6 — ABNORMAL HIGH (ref 0.8–1.2)
Prothrombin Time: 27.9 seconds — ABNORMAL HIGH (ref 11.4–15.2)

## 2021-09-23 MED ORDER — SODIUM CHLORIDE 0.9 % IV SOLN: INTRAVENOUS | Status: DC

## 2021-09-23 MED ORDER — WARFARIN SODIUM 5 MG PO TABS
5.0000 mg | ORAL_TABLET | Freq: Once | ORAL | Status: AC
  Administered 2021-09-23: 16:00:00 5 mg via ORAL
  Filled 2021-09-23: qty 1

## 2021-09-23 MED ORDER — SODIUM CHLORIDE 0.9 % IV SOLN
300.0000 mg | Freq: Once | INTRAVENOUS | Status: AC
  Administered 2021-09-23: 17:00:00 300 mg via INTRAVENOUS
  Filled 2021-09-23: qty 15

## 2021-09-23 MED FILL — Heparin Sod (Porcine)-NaCl IV Soln 1000 Unit/500ML-0.9%: INTRAVENOUS | Qty: 1000 | Status: AC

## 2021-09-23 NOTE — Progress Notes (Signed)
PROGRESS NOTE    Terri Sparks  ONG:[id number] DOB: 13-Nov-1975 DOA: 09/17/2021 PCP: Ladell Pier, MD    Brief Narrative:  45 year old female with medical history significant for chronic hypercoagulable state with multiple arterial thrombosis and embolization, s/p multiple thrombolysis, on chronic Coumadin therapy, HTN, type II DM/IDDM, HLD, anxiety/depression, tobacco use disorder, GERD, presented with worsening left foot pain and discoloration.  Admitted by vascular surgery for acute ischemic left lower extremity for which he underwent left lower extremity angiogram with initiation of thrombolysis.  TRH were consulted for evaluation and management of incidentally positive COVID-19 PCR testing.  Has remained stable from a COVID-19 perspective.  Assisted with better control of DM.  Approaching stability for discharge, disposition to be determined.  PT recommends SNF, TOC consulted.   Assessment & Plan:   Principal Problem:   Critical lower limb ischemia (HCC)  Acute on chronic stage IIIa chronic kidney disease. Hyponatremia.   Patient renal function seem to be worsened today, she also developed worsening hyponatremia. She does not have volume overload, she did receive IV contrast. I will start IV fluids.  Fluid restriction.  Acute left lower extremity ischemia. Peripheral arterial disease. Patient had extensive thrombectomy, stenting of the left lower extremity. Patient restarted on aspirin, Plavix and warfarin.  INR therapeutic.  Iron deficient anemia. Patient required blood transfusion yesterday.  Iron level is low, will give IV iron today.  Recheck a CBC tomorrow. And will need outpatient follow-up with OB/GYN for menorrhagia.   Uncontrolled type 2 diabetes with hyperglycemia. Hemoglobin A1c 10.6, continue current regimen with Lantus and sliding scale insulin.  COVID infection. Patient is asymptomatic, no hypoxemia.  No need for treatment.  Intermittent  dysphagia. Follow-up with GI as outpatient.   DVT prophylaxis: Coumadin Code Status: full Family Communication:  Disposition Plan:    Status is: Inpatient  Remains inpatient appropriate because: Severity of disease, IV treatment.  Most likely discharge tomorrow.        I/O last 3 completed shifts: In: 1632 [P.O.:1200; Blood:432] Out: 0  No intake/output data recorded.     Consultants:    Procedures: Vascular  Antimicrobials: None  Subjective: Doing better, was able to walk with physical therapy. Denies any abdominal pain nausea vomiting today.  No dysphagia.  No short of breath or cough. No dysuria or hematuria. No fever or chills.  Objective: Vitals:   09/22/21 2321 09/23/21 0300 09/23/21 0746 09/23/21 1127  BP: (!) 102/52 109/65 100/60 135/65  Pulse: 90 95 90   Resp: 16 19 17 16   Temp: 97.8 F (36.6 C) 98 F (36.7 C) 98 F (36.7 C) 97.7 F (36.5 C)  TempSrc: Oral Oral Oral   SpO2: 96% 97% 92% 99%  Weight:      Height:        Intake/Output Summary (Last 24 hours) at 09/23/2021 1350 Last data filed at 09/22/2021 2324 Gross per 24 hour  Intake 432 ml  Output 0 ml  Net 432 ml   Filed Weights   09/17/21 0410  Weight: 114.3 kg    Examination:  General exam: Appears calm and comfortable  Respiratory system: Clear to auscultation. Respiratory effort normal. Cardiovascular system: S1 & S2 heard, RRR. No JVD, murmurs, rubs, gallops or clicks. No pedal edema. Gastrointestinal system: Abdomen is nondistended, soft and nontender. No organomegaly or masses felt. Normal bowel sounds heard. Central nervous system: Alert and oriented. No focal neurological deficits. Extremities: Symmetric 5 x 5 power. Skin: No rashes, lesions or ulcers Psychiatry: Judgement  and insight appear normal. Mood & affect appropriate.     Data Reviewed: I have personally reviewed following labs and imaging studies  CBC: Recent Labs  Lab 09/17/21 0406 09/17/21 1638  09/19/21 0113 09/20/21 0726 09/21/21 0446 09/22/21 0607 09/22/21 1808 09/23/21 0325  WBC 14.5*   < > 16.5* 12.9* 11.5* 14.5*  --  15.4*  NEUTROABS 10.1*  --   --   --   --   --   --   --   HGB 10.6*   < > 9.4* 8.9* 7.5* 7.0* 7.5* 7.1*  HCT 33.6*   < > 29.9* 29.3* 25.3* 22.7* 24.3* 22.8*  MCV 79.8*   < > 78.7* 80.9 82.1 81.7  --  81.4  PLT 507*   < > 393 435* 447* 446*  --  427*   < > = values in this interval not displayed.   Basic Metabolic Panel: Recent Labs  Lab 09/19/21 0113 09/20/21 0726 09/21/21 0446 09/22/21 0607 09/23/21 0325  NA 130* 133* 132* 135 129*  K 4.4 4.6 4.5 4.9 4.8  CL 101 104 103 104 101  CO2 21* 21* 20* 23 20*  GLUCOSE 289* 137* 134* 166* 191*  BUN 11 21* 25* 24* 27*  CREATININE 1.35* 1.57* 1.76* 1.78* 1.84*  CALCIUM 8.9 9.0 8.9 9.1 8.9   GFR: Estimated Creatinine Clearance: 49.9 mL/min (A) (by C-G formula based on SCr of 1.84 mg/dL (H)). Liver Function Tests: Recent Labs  Lab 09/17/21 0406  AST 12*  ALT 13  ALKPHOS 93  BILITOT 0.2*  PROT 7.3  ALBUMIN 2.9*   No results for input(s): LIPASE, AMYLASE in the last 168 hours. No results for input(s): AMMONIA in the last 168 hours. Coagulation Profile: Recent Labs  Lab 09/19/21 0113 09/20/21 0726 09/21/21 0446 09/22/21 0607 09/23/21 0325  INR 1.9* 1.8* 2.5* 2.3* 2.6*   Cardiac Enzymes: No results for input(s): CKTOTAL, CKMB, CKMBINDEX, TROPONINI in the last 168 hours. BNP (last 3 results) No results for input(s): PROBNP in the last 8760 hours. HbA1C: No results for input(s): HGBA1C in the last 72 hours. CBG: Recent Labs  Lab 09/22/21 1215 09/22/21 1622 09/22/21 2138 09/23/21 0617 09/23/21 1136  GLUCAP 71 111* 100* 215* 98   Lipid Profile: No results for input(s): CHOL, HDL, LDLCALC, TRIG, CHOLHDL, LDLDIRECT in the last 72 hours. Thyroid Function Tests: No results for input(s): TSH, T4TOTAL, FREET4, T3FREE, THYROIDAB in the last 72 hours. Anemia Panel: Recent Labs     09/21/21 0446 09/22/21 0607  VITAMINB12  --  685  FOLATE  --  13.6  FERRITIN 14 46  TIBC  --  368  IRON  --  16*   Sepsis Labs: Recent Labs  Lab 09/17/21 1638  PROCALCITON <0.10    Recent Results (from the past 240 hour(s))  Resp Panel by RT-PCR (Flu A&B, Covid) Nasopharyngeal Swab     Status: Abnormal   Collection Time: 09/17/21 10:39 AM   Specimen: Nasopharyngeal Swab; Nasopharyngeal(NP) swabs in vial transport medium  Result Value Ref Range Status   SARS Coronavirus 2 by RT PCR POSITIVE (A) NEGATIVE Final    Comment: (NOTE) SARS-CoV-2 target nucleic acids are DETECTED.  The SARS-CoV-2 RNA is generally detectable in upper respiratory specimens during the acute phase of infection. Positive results are indicative of the presence of the identified virus, but do not rule out bacterial infection or co-infection with other pathogens not detected by the test. Clinical correlation with patient history and other diagnostic information is  necessary to determine patient infection status. The expected result is Negative.  Fact Sheet for Patients: EntrepreneurPulse.com.au  Fact Sheet for Healthcare Providers: IncredibleEmployment.be  This test is not yet approved or cleared by the [place] FDA and  has been authorized for detection and/or diagnosis of SARS-CoV-2 by FDA under an Emergency Use Authorization (EUA).  This EUA will remain in effect (meaning this test can be used) for the duration of  the COVID-19 declaration under Section 564(b)(1) of the A ct, 21 U.S.C. section 360bbb-3(b)(1), unless the authorization is terminated or revoked sooner.     Influenza A by PCR NEGATIVE NEGATIVE Final   Influenza B by PCR NEGATIVE NEGATIVE Final    Comment: (NOTE) The Xpert Xpress SARS-CoV-2/FLU/RSV plus assay is intended as an aid in the diagnosis of influenza from Nasopharyngeal swab specimens and should not be used as a sole basis for  treatment. Nasal washings and aspirates are unacceptable for Xpert Xpress SARS-CoV-2/FLU/RSV testing.  Fact Sheet for Patients: EntrepreneurPulse.com.au  Fact Sheet for Healthcare Providers: IncredibleEmployment.be  This test is not yet approved or cleared by the [place] FDA and has been authorized for detection and/or diagnosis of SARS-CoV-2 by FDA under an Emergency Use Authorization (EUA). This EUA will remain in effect (meaning this test can be used) for the duration of the COVID-19 declaration under Section 564(b)(1) of the Act, 21 U.S.C. section 360bbb-3(b)(1), unless the authorization is terminated or revoked.  Performed at [facility], [place] [address], [place], [place] [address]   MRSA Next Gen by PCR, Nasal     Status: None   Collection Time: 09/17/21  5:00 PM   Specimen: Nasal Mucosa; Nasal Swab  Result Value Ref Range Status   MRSA by PCR Next Gen NOT DETECTED NOT DETECTED Final    Comment: (NOTE) The GeneXpert MRSA Assay (FDA approved for NASAL specimens only), is one component of a comprehensive MRSA colonization surveillance program. It is not intended to diagnose MRSA infection nor to guide or monitor treatment for MRSA infections. Test performance is not FDA approved in patients less than 44 years old. Performed at [facility], [place] [address], [place], [place] [address]          Radiology Studies: No results found.      Scheduled Meds:  amLODipine  5 mg Oral Daily   aspirin EC  81 mg Oral Daily   atorvastatin  40 mg Oral Daily   buPROPion  150 mg Oral BID   carvedilol  3.125 mg Oral BID   Chlorhexidine Gluconate Cloth  6 each Topical Daily   clopidogrel  75 mg Oral Daily   feeding supplement (GLUCERNA SHAKE)  237 mL Oral TID BM   folic acid  1 mg Oral Daily   gabapentin  400 mg Oral TID   insulin aspart  0-20 Units Subcutaneous TID WC   insulin aspart  0-5 Units Subcutaneous QHS    insulin glargine-yfgn  45 Units Subcutaneous Daily   multivitamin with minerals  1 tablet Oral Daily   nicotine  21 mg Transdermal Daily   olopatadine  1 drop Both Eyes BID   pantoprazole  40 mg Oral Daily   polyethylene glycol  17 g Oral Daily   senna  2 tablet Oral Daily   sertraline  150 mg Oral Daily   sodium chloride flush  3 mL Intravenous Q12H   sodium chloride flush  3 mL Intravenous Q12H   vitamin B-12  2,000 mcg Oral  Daily   warfarin  5 mg Oral ONCE-1600   Warfarin - Pharmacist Dosing Inpatient   Does not apply q1600   Continuous Infusions:  sodium chloride     sodium chloride     iron sucrose       LOS: 6 days    Time spent: 32 minutes    Sharen Hones, MD [facility]   To contact the attending provider between 7A-7P or the covering provider during after hours 7P-7A, please log into the web site [email or web address] and access using universal [place] password for that web site. If you do not have the password, please call the hospital operator.  09/23/2021, 1:50 PM

## 2021-09-23 NOTE — Progress Notes (Signed)
On first assessment, pt in bed, wakes to voice but drowsy and easily falling asleep. Pt states she is tired from not sleeping well past few nights and feeling depressed. Denies any thought of hurting self or suicide. Pt then walked to bathroom and more alert. Requesting meds for pain and anxiety. RN educated pt on needing to space out medications due to pt being drowsy and not giving perocet and clonazepam and gabapentin all at once. Pt states understanding and agreeable to plan.  Educated on fall precautions throughout night. Pt agreeable to bed alarm during night but refused at 0700 in morning; day shift RN notified.

## 2021-09-23 NOTE — Progress Notes (Signed)
Physical Therapy Treatment Patient Details Name: Terri Sparks MRN: [id number] DOB: September 07, 1976 Today's Date: 09/23/2021   History of Present Illness Pt is a 45 y.o. female admitted 09/17/2021 with worsening L foot pain and discoloration; workup for acute LLE ischemia. S/p LLE angiogram with initiation of thrombolysis on 12/15. S/p aortogram and thrombectomy on 12/16. PMH includes chronic hypercoagulable state with multiple arterial thrombosis and embolization, s/p multiple thrombolysis, HTN, DM 2, HLD, anxiety/depression.   PT Comments    Pt progressing with mobility. Today's session focused on transfer and gait training with RW; pt mobilizing well with RW despite c/o persistent LLE pain. Pt reports plan to d/c to motel; declines stair training since there will be no stairs she has to manage. If pt to remain admitted, will continue to follow acutely to address established goals.    Recommendations for follow up therapy are one component of a multi-disciplinary discharge planning process, led by the attending physician.  Recommendations may be updated based on patient status, additional functional criteria and insurance authorization.  Follow Up Recommendations  No PT follow up     Assistance Recommended at Discharge PRN  Equipment Recommendations  Rolling walker (2 wheels)    Recommendations for Other Services       Precautions / Restrictions Precautions Precautions: Fall     Mobility  Bed Mobility               General bed mobility comments: Received sitting EOB    Transfers Overall transfer level: Needs assistance Equipment used: Rolling walker (2 wheels) Transfers: Sit to/from Stand Sit to Stand: Modified independent (Device/Increase time)           General transfer comment: Multiple sit<>stands from EOB and toilet mod indep with RW    Ambulation/Gait Ambulation/Gait assistance: Supervision Gait Distance (Feet): 24 Feet Assistive device: Rolling walker  (2 wheels) Gait Pattern/deviations: Step-to pattern;Antalgic;Trunk flexed;Decreased weight shift to left Gait velocity: Decreased     General Gait Details: Slow, antalgic gait with RW and c/o LLE pain; supervision for safety, but pt managing well with RW despite pain; further distance limited by pt wanting washup and MD entering   Stairs Stairs:  (pt declined, reports no stairs she will have to climb at [employer], curb is level)           Wheelchair Mobility    Modified Rankin (Stroke Patients Only)       Balance Overall balance assessment: Needs assistance   Sitting balance-Leahy Scale: Good     Standing balance support: No upper extremity supported;During functional activity Standing balance-Leahy Scale: Fair Standing balance comment: can static stand without UE support, preference for RW to offload painful LLE                            Cognition Arousal/Alertness: Awake/alert Behavior During Therapy: WFL for tasks assessed/performed Overall Cognitive Status: Within Functional Limits for tasks assessed                                          Exercises      General Comments General comments (skin integrity, edema, etc.): CM notified pt reports d/c to motel, would benefit from RW. Educ pt re: RW use, edema control, activity recommendations      Pertinent Vitals/Pain Pain Assessment: Faces Faces Pain Scale: Hurts little more Pain Location: LLE  Pain Descriptors / Indicators: Discomfort;Sore Pain Intervention(s): Monitored during session    Home Living                          Prior Function            PT Goals (current goals can now be found in the care plan section) Acute Rehab PT Goals Patient Stated Goal: d/c to motel Progress towards PT goals: Progressing toward goals    Frequency    Min 3X/week      PT Plan Current plan remains appropriate    Co-evaluation              AM-PAC PT "6 Clicks"  Mobility   Outcome Measure  Help needed turning from your back to your side while in a flat bed without using bedrails?: None Help needed moving from lying on your back to sitting on the side of a flat bed without using bedrails?: None Help needed moving to and from a bed to a chair (including a wheelchair)?: None Help needed standing up from a chair using your arms (e.g., wheelchair or bedside chair)?: None Help needed to walk in hospital room?: A Little Help needed climbing 3-5 steps with a railing? : A Little 6 Click Score: 22    End of Session   Activity Tolerance: Patient tolerated treatment well;Patient limited by pain Patient left: in bed;with call bell/phone within reach;Other (comment) (sitting EOB with MD present) Nurse Communication: Mobility status PT Visit Diagnosis: Other abnormalities of gait and mobility (R26.89);Muscle weakness (generalized) (M62.81);Pain Pain - Right/Left: Left Pain - part of body: Leg;Ankle and joints of foot     Time: 0315-9458 PT Time Calculation (min) (ACUTE ONLY): 17 min  Charges:  $Therapeutic Activity: 8-22 mins                     Mabeline Caras, PT, DPT Acute Rehabilitation Services  Pager [phone] Office [place] 09/23/2021, 10:27 AM

## 2021-09-23 NOTE — Progress Notes (Signed)
ANTICOAGULATION CONSULT NOTE - Follow Up Consult  Pharmacy Consult for Warfarin Indication:  VTE treatment  Allergies  Allergen Reactions   Ciprofloxacin Hcl Hives   Dilaudid [Hydromorphone Hcl] Shortness Of Breath and Other (See Comments)    "Asystole," per pt report   Macrobid [Nitrofurantoin Macrocrystal] Hives, Shortness Of Breath and Rash   Other Hives, Shortness Of Breath and Rash    NO "-CILLINS"!!!   Penicillins Shortness Of Breath    Had had cephalosporins without incident Has patient had a PCN reaction causing immediate rash, facial/tongue/throat swelling, SOB or lightheadedness with hypotension: Yes Has patient had a PCN reaction causing severe rash involving mucus membranes or skin necrosis: Unk Has patient had a PCN reaction that required hospitalization: Unk Has patient had a PCN reaction occurring within the last 10 years: No If all of the above answers are "NO", then may proceed with Cephalosporin use.    Sulfa Antibiotics Hives, Shortness Of Breath and Rash   Lexapro [Escitalopram Oxalate] Other (See Comments)    "I just did not like it."    Patient Measurements: Height: 5\' 7"  (170.2 cm) Weight: 114.3 kg (252 lb) IBW/kg (Calculated) : 61.6  Vital Signs: Temp: 97.7 F (36.5 C) (12/21 1127) Temp Source: Oral (12/21 0746) BP: 135/65 (12/21 1127) Pulse Rate: 90 (12/21 0746)  Labs: Recent Labs    09/20/21 1622 09/21/21 0446 09/21/21 0446 09/22/21 0607 09/22/21 1808 09/23/21 0325  HGB  --  7.5*   < > 7.0* 7.5* 7.1*  HCT  --  25.3*   < > 22.7* 24.3* 22.8*  PLT  --  447*  --  446*  --  427*  LABPROT  --  27.1*  --  25.4*  --  27.9*  INR  --  2.5*  --  2.3*  --  2.6*  HEPARINUNFRC 0.48 0.64  --   --   --   --   CREATININE  --  1.76*  --  1.78*  --  1.84*   < > = values in this interval not displayed.    Estimated Creatinine Clearance: 49.9 mL/min (A) (by C-G formula based on SCr of 1.84 mg/dL (H)).  Assessment: 45 yo F with LLE ischemia - no pedal  or posterior tibial pulse. Cap refill is > 3 secs. Pt on warfarin PTA for extensive vascular hx including DVT and arterial issues. Heparin was initiated and pt underwent peripheral lysis.   INR into therapeutic range on 12/19 and IV heparin was stopped.  INR remains therapeutic (2.6).   Plavix added s/p stent 12/16; also on ASA 81 mg daily.   Transfused 1 unit on 12/20, Hgb remains low.  Noted menorrhagia.   PTA warfarin regimen: 10 mg MWF, 5 mg TTSS.  Goal of Therapy:  INR 2-3 Monitor platelets by anticoagulation protocol: Yes   Plan:  Warfarin 5 mg x 1 again today, rather than usual Wednesday dose of 10 mg.  May need lower dose at discharge and close outpatient follow-up. Daily PT/INR. Monitor for bleeding.  Arty Baumgartner, [place] 09/23/2021,12:23 PM

## 2021-09-23 NOTE — Plan of Care (Signed)
  Problem: Education: Goal: Knowledge of General Education information will improve Description Including pain rating scale, medication(s)/side effects and non-pharmacologic comfort measures Outcome: Progressing   

## 2021-09-23 NOTE — Discharge Instructions (Addendum)
Information on my medicine - Coumadin   (Warfarin)  Why was Coumadin prescribed for you? Coumadin was prescribed for you because you have a blood clot or a medical condition that can cause an increased risk of forming blood clots. Blood clots can cause serious health problems by blocking the flow of blood to the heart, lung, or brain. Coumadin can prevent harmful blood clots from forming. As a reminder your indication for Coumadin is:  blood clots in the blood vessels of the legs  What test will check on my response to Coumadin? While on Coumadin (warfarin) you will need to have an INR test regularly to ensure that your dose is keeping you in the desired range. The INR (international normalized ratio) number is calculated from the result of the laboratory test called prothrombin time (PT).  If an INR APPOINTMENT HAS NOT ALREADY BEEN MADE FOR YOU please schedule an appointment to have this lab work done by your health care provider within 7 days. Your INR goal is usually a number between:  2 to 3 or your provider may give you a more narrow range like 2-2.5.  Ask your health care provider during an office visit what your goal INR is.  What  do you need to  know  About  COUMADIN? Take Coumadin (warfarin) exactly as prescribed by your healthcare provider about the same time each day.  DO NOT stop taking without talking to the doctor who prescribed the medication.  Stopping without other blood clot prevention medication to take the place of Coumadin may increase your risk of developing a new clot or stroke.  Get refills before you run out.  What do you do if you miss a dose? If you miss a dose, take it as soon as you remember on the same day then continue your regularly scheduled regimen the next day.  Do not take two doses of Coumadin at the same time.  Important Safety Information A possible side effect of Coumadin (Warfarin) is an increased risk of bleeding. You should call your healthcare provider  right away if you experience any of the following: Bleeding from an injury or your nose that does not stop. Unusual colored urine (red or dark brown) or unusual colored stools (red or black). Unusual bruising for unknown reasons. A serious fall or if you hit your head (even if there is no bleeding).  Some foods or medicines interact with Coumadin (warfarin) and might alter your response to warfarin. To help avoid this: Eat a balanced diet, maintaining a consistent amount of Vitamin K. Notify your provider about major diet changes you plan to make. Avoid alcohol or limit your intake to 1 drink for women and 2 drinks for men per day. (1 drink is 5 oz. wine, 12 oz. beer, or 1.5 oz. liquor.)  Make sure that ANY health care provider who prescribes medication for you knows that you are taking Coumadin (warfarin).  Also make sure the healthcare provider who is monitoring your Coumadin knows when you have started a new medication including herbals and non-prescription products.  Coumadin (Warfarin)  Major Drug Interactions  Increased Warfarin Effect Decreased Warfarin Effect  Alcohol (large quantities) Antibiotics (esp. Septra/Bactrim, Flagyl, Cipro) Amiodarone (Cordarone) Aspirin (ASA) Cimetidine (Tagamet) Megestrol (Megace) NSAIDs (ibuprofen, naproxen, etc.) Piroxicam (Feldene) Propafenone (Rythmol SR) Propranolol (Inderal) Isoniazid (INH) Posaconazole (Noxafil) Barbiturates (Phenobarbital) Carbamazepine (Tegretol) Chlordiazepoxide (Librium) Cholestyramine (Questran) Griseofulvin Oral Contraceptives Rifampin Sucralfate (Carafate) Vitamin K   Coumadin (Warfarin) Major Herbal Interactions  Increased Warfarin Effect  Decreased Warfarin Effect  Garlic Ginseng Ginkgo biloba Coenzyme Q10 Green tea St. Johns wort    Coumadin (Warfarin) FOOD Interactions  Eat a consistent number of servings per week of foods HIGH in Vitamin K (1 serving =  cup)  Collards (cooked, or boiled &  drained) Kale (cooked, or boiled & drained) Mustard greens (cooked, or boiled & drained) Parsley *serving size only =  cup Spinach (cooked, or boiled & drained) Swiss chard (cooked, or boiled & drained) Turnip greens (cooked, or boiled & drained)  Eat a consistent number of servings per week of foods MEDIUM-HIGH in Vitamin K (1 serving = 1 cup)  Asparagus (cooked, or boiled & drained) Broccoli (cooked, boiled & drained, or raw & chopped) Brussel sprouts (cooked, or boiled & drained) *serving size only =  cup Lettuce, raw (green leaf, endive, romaine) Spinach, raw Turnip greens, raw & chopped   These websites have more information on Coumadin (warfarin):  FailFactory.se; VeganReport.com.au;   [facility]  Discharge Instructions  Lower Extremity Angiogram; Angioplasty/Stenting  Please refer to the following instructions for your post-procedure care. Your surgeon or physician assistant will discuss any changes with you.  Activity  Avoid lifting more than 8 pounds (1 gallons of milk) for 5 days after your procedure. You may walk as much as you can tolerate. It's OK to drive after 72 hours.  Bathing/Showering  You may shower the day after your procedure. If you have a bandage, you may remove it at 24- 48 hours. Clean your incision site with mild soap and water. Pat the area dry with a clean towel.  Diet  Resume your pre-procedure diet. There are no special food restrictions following this procedure. All patients with peripheral vascular disease should follow a low fat/low cholesterol diet. In order to heal from your surgery, it is CRITICAL to get adequate nutrition. Your body requires vitamins, minerals, and protein. Vegetables are the best source of vitamins and minerals. Vegetables also provide the perfect balance of protein. Processed food has little nutritional value, so try to avoid this.  Medications  Resume taking all of  your medications unless your doctor tells you not to. If your incision is causing pain, you may take over-the-counter pain relievers such as acetaminophen (Tylenol)  Follow Up  Follow up will be arranged at the time of your procedure. You may have an office visit scheduled or may be scheduled for surgery. Ask your surgeon if you have any questions.  Please call us immediately for any of the following conditions: Severe or worsening pain your legs or feet at rest or with walking. Increased pain, redness, drainage at your groin puncture site. Fever of 101 degrees or higher. If you have any mild or slow bleeding from your puncture site: lie down, apply firm constant pressure over the area with a piece of gauze or a clean wash cloth for 30 minutes- no peeking!, call 911 right away if you are still bleeding after 30 minutes, or if the bleeding is heavy and unmanageable.  Reduce your risk factors of vascular disease:  Stop smoking. If you would like help call [facility] at 1-800-QUIT-NOW 423 343 0045) or [place] at 303-435-4336. Manage your cholesterol Maintain a desired weight Control your diabetes Keep your blood pressure down  If you have any questions, please call the office at [phone]

## 2021-09-23 NOTE — Progress Notes (Addendum)
°  Progress Note    09/23/2021 8:37 AM 5 Days Post-Op  Subjective:  no major complaints. States she has a place to go tomorrow   Vitals:   09/23/21 0300 09/23/21 0746  BP: 109/65 100/60  Pulse: 95 90  Resp: 19 17  Temp: 98 F (36.7 C) 98 F (36.7 C)  SpO2: 97% 92%   Physical Exam: Cardiac:  regular Lungs:  non labored Incisions: Right groin without hematoma Extremities:  Left PT monophasic doppler signal Abdomen:  obese, soft. Small suprapubic abscess. No drainage Neurologic: alert and oriented  CBC    Component Value Date/Time   WBC 15.4 (H) 09/23/2021 0325   RBC 2.80 (L) 09/23/2021 0325   HGB 7.1 (L) 09/23/2021 0325   HGB 11.8 07/21/2021 1515   HCT 22.8 (L) 09/23/2021 0325   HCT 36.8 07/21/2021 1515   PLT 427 (H) 09/23/2021 0325   PLT 439 07/21/2021 1515   MCV 81.4 09/23/2021 0325   MCV 80 07/21/2021 1515   MCH 25.4 (L) 09/23/2021 0325   MCHC 31.1 09/23/2021 0325   RDW 16.9 (H) 09/23/2021 0325   RDW 16.2 (H) 07/21/2021 1515   LYMPHSABS 2.9 09/17/2021 0406   LYMPHSABS 3.1 01/21/2020 1603   MONOABS 0.9 09/17/2021 0406   EOSABS 0.5 09/17/2021 0406   EOSABS 0.4 01/21/2020 1603   BASOSABS 0.1 09/17/2021 0406   BASOSABS 0.1 01/21/2020 1603    BMET    Component Value Date/Time   NA 129 (L) 09/23/2021 0325   NA 138 07/21/2021 1515   K 4.8 09/23/2021 0325   CL 101 09/23/2021 0325   CO2 20 (L) 09/23/2021 0325   GLUCOSE 191 (H) 09/23/2021 0325   BUN 27 (H) 09/23/2021 0325   BUN 13 07/21/2021 1515   CREATININE 1.84 (H) 09/23/2021 0325   CREATININE 1.88 (H) 02/19/2020 1555   CALCIUM 8.9 09/23/2021 0325   GFRNONAA 34 (L) 09/23/2021 0325   GFRNONAA 32 (L) 02/19/2020 1555   GFRAA 30 (L) 07/29/2020 1528   GFRAA 37 (L) 02/19/2020 1555    INR    Component Value Date/Time   INR 2.6 (H) 09/23/2021 0325     Intake/Output Summary (Last 24 hours) at 09/23/2021 0837 Last data filed at 09/22/2021 2324 Gross per 24 hour  Intake 432 ml  Output 0 ml  Net 432  ml     Assessment/Plan:  45 y.o. female is s/p thrombolysis with L CIA stent, profunda thrombectomy, popliteal thrombectomy and stenting, and peroneal stenting 5 Days Post-Op   L PTA monophasic by doppler INR therapeutic; continue coumadin  Hemodynamically stable transfused 1 unit PRBC yesterday TOC helping with placement  Patient has friend assisting with getting her a room. Apparently she will have a place to go tomorrow afternoon Appreciate TRH assistance    Marval Regal [facility] [phone] 09/23/2021 8:37 AM  I have independently interviewed and examined patient and agree with PA assessment and plan above.  Foot appears warm and perfused with stable discoloration of the first 2 toes on the left.  There is a suprapubic abscess that I was able to burst at the bedside with significant foul-smelling drainage.  This was not amenable for packing and if it recurs will need to be sharply incised for a larger opening.  We will otherwise plan for discharge in the coming days when she has stable disposition.  Dejuan Elman C. Donzetta Matters, MD [facility] [place] Office: [phone] Pager: [phone]

## 2021-09-24 DIAGNOSIS — I70229 Atherosclerosis of native arteries of extremities with rest pain, unspecified extremity: Secondary | ICD-10-CM | POA: Diagnosis not present

## 2021-09-24 DIAGNOSIS — N179 Acute kidney failure, unspecified: Secondary | ICD-10-CM | POA: Diagnosis not present

## 2021-09-24 DIAGNOSIS — N1831 Chronic kidney disease, stage 3a: Secondary | ICD-10-CM | POA: Diagnosis not present

## 2021-09-24 DIAGNOSIS — U071 COVID-19: Secondary | ICD-10-CM | POA: Diagnosis not present

## 2021-09-24 LAB — CBC
HCT: 23.8 % — ABNORMAL LOW (ref 36.0–46.0)
Hemoglobin: 7.4 g/dL — ABNORMAL LOW (ref 12.0–15.0)
MCH: 25.5 pg — ABNORMAL LOW (ref 26.0–34.0)
MCHC: 31.1 g/dL (ref 30.0–36.0)
MCV: 82.1 fL (ref 80.0–100.0)
Platelets: 564 10*3/uL — ABNORMAL HIGH (ref 150–400)
RBC: 2.9 MIL/uL — ABNORMAL LOW (ref 3.87–5.11)
RDW: 17.3 % — ABNORMAL HIGH (ref 11.5–15.5)
WBC: 17.9 10*3/uL — ABNORMAL HIGH (ref 4.0–10.5)
nRBC: 0.1 % (ref 0.0–0.2)

## 2021-09-24 LAB — GLUCOSE, CAPILLARY
Glucose-Capillary: 166 mg/dL — ABNORMAL HIGH (ref 70–99)
Glucose-Capillary: 80 mg/dL (ref 70–99)
Glucose-Capillary: 113 mg/dL — ABNORMAL HIGH (ref 70–99)
Glucose-Capillary: 147 mg/dL — ABNORMAL HIGH (ref 70–99)

## 2021-09-24 LAB — PROTIME-INR
INR: 2.5 — ABNORMAL HIGH (ref 0.8–1.2)
Prothrombin Time: 27.2 seconds — ABNORMAL HIGH (ref 11.4–15.2)

## 2021-09-24 LAB — MAGNESIUM: Magnesium: 2.1 mg/dL (ref 1.7–2.4)

## 2021-09-24 MED ORDER — WARFARIN SODIUM 5 MG PO TABS
5.0000 mg | ORAL_TABLET | Freq: Once | ORAL | Status: AC
  Administered 2021-09-24: 16:00:00 5 mg via ORAL
  Filled 2021-09-24: qty 1

## 2021-09-24 MED ORDER — LACTULOSE 10 GM/15ML PO SOLN
20.0000 g | Freq: Once | ORAL | Status: AC
  Administered 2021-09-24: 09:00:00 20 g via ORAL

## 2021-09-24 NOTE — Progress Notes (Signed)
PROGRESS NOTE    Terri Sparks  QTM:[id number] DOB: [birth year] DOA: 09/17/2021 PCP: Ladell Pier, MD    Brief Narrative:   45 year old female with medical history significant for chronic hypercoagulable state with multiple arterial thrombosis and embolization, s/p multiple thrombolysis, on chronic Coumadin therapy, HTN, type II DM/IDDM, HLD, anxiety/depression, tobacco use disorder, GERD, presented with worsening left foot pain and discoloration.  Admitted by vascular surgery for acute ischemic left lower extremity for which he underwent left lower extremity angiogram with initiation of thrombolysis.  TRH were consulted for evaluation and management of incidentally positive COVID-19 PCR testing.  Has remained stable from a COVID-19 perspective.  Assisted with better control of DM. Marland Kitchen  Assessment & Plan:   Principal Problem:   Critical lower limb ischemia ([place]) Active Problems:   Peripheral vascular disease of lower extremity (HCC)   Insulin-requiring or dependent type II diabetes mellitus ([place])   Acute renal failure superimposed on stage 3a chronic kidney disease (HCC)   Iron deficiency anemia due to chronic blood loss   COVID-19 virus infection  Acute on chronic stage IIIa chronic kidney disease. Hyponatremia.   Patient received IV fluids, sodium level is better yesterday.  Recheck a BMP tomorrow.  Acute left lower extremity ischemia. Peripheral arterial disease. Medically stable to be discharged from vascular surgeon standpoint.    Iron deficient anemia. Received IV iron, recheck a CBC tomorrow.  Uncontrolled type 2 diabetes with hyperglycemia. Hemoglobin A1c 10.6, continue current regimen with Lantus and sliding scale insulin.  COVID infection. Patient is asymptomatic, no hypoxemia.  No need for treatment.  Intermittent dysphagia. Follow-up with GI as outpatient.    DVT prophylaxis: Coumadin Code Status: full Family Communication:  Disposition Plan:       Status is: Inpatient   Remains inpatient appropriate because: Unsafe discharge Patient does not have a home to go to, she decided to go to hotel tomorrow once fund is available to her tomorrow.     I/O last 3 completed shifts: In: 1693.3 [P.O.:1440; IV Piggyback:253.3] Out: 0  Total I/O In: 3 [I.V.:3] Out: -       Subjective: Patient doing well today, she still has some pain in the leg, but able to walk without difficulty. Does not have any short of breath or cough. No abdominal pain or nausea vomiting.   Objective: Vitals:   09/23/21 2244 09/24/21 0613 09/24/21 0802 09/24/21 0914  BP: 90/68 (!) 114/59 121/64   Pulse: 90 86 94 90  Resp: 16 17 14    Temp: (!) 97.5 F (36.4 C) 97.7 F (36.5 C) 98.2 F (36.8 C)   TempSrc: Oral Oral Oral   SpO2: 93% 96% 98%   Weight:      Height:        Intake/Output Summary (Last 24 hours) at 09/24/2021 1210 Last data filed at 09/24/2021 6256 Gross per 24 hour  Intake 1216.27 ml  Output --  Net 1216.27 ml   Filed Weights   09/17/21 0410  Weight: 114.3 kg    Examination:  General exam: Appears calm and comfortable  Respiratory system: Clear to auscultation. Respiratory effort normal. Cardiovascular system: S1 & S2 heard, RRR. No JVD, murmurs, rubs, gallops or clicks. No pedal edema. Gastrointestinal system: Abdomen is nondistended, soft and nontender. No organomegaly or masses felt. Normal bowel sounds heard. Central nervous system: Alert and oriented. No focal neurological deficits. Extremities: Symmetric 5 x 5 power. Skin: No rashes, lesions or ulcers Psychiatry: Judgement and insight appear normal. Mood &  affect appropriate.     Data Reviewed: I have personally reviewed following labs and imaging studies  CBC: Recent Labs  Lab 09/20/21 0726 09/21/21 0446 09/22/21 0607 09/22/21 1808 09/23/21 0325 09/24/21 0608  WBC 12.9* 11.5* 14.5*  --  15.4* 17.9*  HGB 8.9* 7.5* 7.0* 7.5* 7.1* 7.4*  HCT 29.3* 25.3* 22.7*  24.3* 22.8* 23.8*  MCV 80.9 82.1 81.7  --  81.4 82.1  PLT 435* 447* 446*  --  427* 409*   Basic Metabolic Panel: Recent Labs  Lab 09/20/21 0726 09/21/21 0446 09/22/21 0607 09/23/21 0325 09/23/21 1425 09/24/21 0608  NA 133* 132* 135 129* 133*  --   K 4.6 4.5 4.9 4.8 4.5  --   CL 104 103 104 101 101  --   CO2 21* 20* 23 20* 24  --   GLUCOSE 137* 134* 166* 191* 92  --   BUN 21* 25* 24* 27* 26*  --   CREATININE 1.57* 1.76* 1.78* 1.84* 1.80*  --   CALCIUM 9.0 8.9 9.1 8.9 9.2  --   MG  --   --   --   --   --  2.1   GFR: Estimated Creatinine Clearance: 51 mL/min (A) (by C-G formula based on SCr of 1.8 mg/dL (H)). Liver Function Tests: No results for input(s): AST, ALT, ALKPHOS, BILITOT, PROT, ALBUMIN in the last 168 hours. No results for input(s): LIPASE, AMYLASE in the last 168 hours. No results for input(s): AMMONIA in the last 168 hours. Coagulation Profile: Recent Labs  Lab 09/20/21 0726 09/21/21 0446 09/22/21 0607 09/23/21 0325 09/24/21 0608  INR 1.8* 2.5* 2.3* 2.6* 2.5*   Cardiac Enzymes: No results for input(s): CKTOTAL, CKMB, CKMBINDEX, TROPONINI in the last 168 hours. BNP (last 3 results) No results for input(s): PROBNP in the last 8760 hours. HbA1C: No results for input(s): HGBA1C in the last 72 hours. CBG: Recent Labs  Lab 09/23/21 1136 09/23/21 1624 09/23/21 2143 09/24/21 0618 09/24/21 1135  GLUCAP 98 111* 131* 166* 80   Lipid Profile: No results for input(s): CHOL, HDL, LDLCALC, TRIG, CHOLHDL, LDLDIRECT in the last 72 hours. Thyroid Function Tests: No results for input(s): TSH, T4TOTAL, FREET4, T3FREE, THYROIDAB in the last 72 hours. Anemia Panel: Recent Labs    09/22/21 0607  VITAMINB12 685  FOLATE 13.6  FERRITIN 46  TIBC 368  IRON 16*   Sepsis Labs: Recent Labs  Lab 09/17/21 1638  PROCALCITON <0.10    Recent Results (from the past 240 hour(s))  Resp Panel by RT-PCR (Flu A&B, Covid) Nasopharyngeal Swab     Status: Abnormal    Collection Time: 09/17/21 10:39 AM   Specimen: Nasopharyngeal Swab; Nasopharyngeal(NP) swabs in vial transport medium  Result Value Ref Range Status   SARS Coronavirus 2 by RT PCR POSITIVE (A) NEGATIVE Final    Comment: (NOTE) SARS-CoV-2 target nucleic acids are DETECTED.  The SARS-CoV-2 RNA is generally detectable in upper respiratory specimens during the acute phase of infection. Positive results are indicative of the presence of the identified virus, but do not rule out bacterial infection or co-infection with other pathogens not detected by the test. Clinical correlation with patient history and other diagnostic information is necessary to determine patient infection status. The expected result is Negative.  Fact Sheet for Patients: EntrepreneurPulse.com.au  Fact Sheet for Healthcare Providers: IncredibleEmployment.be  This test is not yet approved or cleared by the [place] FDA and  has been authorized for detection and/or diagnosis of SARS-CoV-2 by FDA under  an Emergency Use Authorization (EUA).  This EUA will remain in effect (meaning this test can be used) for the duration of  the COVID-19 declaration under Section 564(b)(1) of the A ct, 21 U.S.C. section 360bbb-3(b)(1), unless the authorization is terminated or revoked sooner.     Influenza A by PCR NEGATIVE NEGATIVE Final   Influenza B by PCR NEGATIVE NEGATIVE Final    Comment: (NOTE) The Xpert Xpress SARS-CoV-2/FLU/RSV plus assay is intended as an aid in the diagnosis of influenza from Nasopharyngeal swab specimens and should not be used as a sole basis for treatment. Nasal washings and aspirates are unacceptable for Xpert Xpress SARS-CoV-2/FLU/RSV testing.  Fact Sheet for Patients: EntrepreneurPulse.com.au  Fact Sheet for Healthcare Providers: IncredibleEmployment.be  This test is not yet approved or cleared by the [place] FDA  and has been authorized for detection and/or diagnosis of SARS-CoV-2 by FDA under an Emergency Use Authorization (EUA). This EUA will remain in effect (meaning this test can be used) for the duration of the COVID-19 declaration under Section 564(b)(1) of the Act, 21 U.S.C. section 360bbb-3(b)(1), unless the authorization is terminated or revoked.  Performed at [facility], [place] [address], [place], [place] [address]   MRSA Next Gen by PCR, Nasal     Status: None   Collection Time: 09/17/21  5:00 PM   Specimen: Nasal Mucosa; Nasal Swab  Result Value Ref Range Status   MRSA by PCR Next Gen NOT DETECTED NOT DETECTED Final    Comment: (NOTE) The GeneXpert MRSA Assay (FDA approved for NASAL specimens only), is one component of a comprehensive MRSA colonization surveillance program. It is not intended to diagnose MRSA infection nor to guide or monitor treatment for MRSA infections. Test performance is not FDA approved in patients less than 39 years old. Performed at [facility], [place] [address] [place], [place] [address]          Radiology Studies: No results found.      Scheduled Meds:  amLODipine  5 mg Oral Daily   aspirin EC  81 mg Oral Daily   atorvastatin  40 mg Oral Daily   buPROPion  150 mg Oral BID   carvedilol  3.125 mg Oral BID   Chlorhexidine Gluconate Cloth  6 each Topical Daily   clopidogrel  75 mg Oral Daily   feeding supplement (GLUCERNA SHAKE)  237 mL Oral TID BM   folic acid  1 mg Oral Daily   gabapentin  400 mg Oral TID   insulin aspart  0-20 Units Subcutaneous TID WC   insulin aspart  0-5 Units Subcutaneous QHS   insulin glargine-yfgn  45 Units Subcutaneous Daily   multivitamin with minerals  1 tablet Oral Daily   nicotine  21 mg Transdermal Daily   olopatadine  1 drop Both Eyes BID   pantoprazole  40 mg Oral Daily   polyethylene glycol  17 g Oral Daily   senna  2 tablet Oral Daily   sertraline  150 mg Oral Daily   sodium chloride  flush  3 mL Intravenous Q12H   sodium chloride flush  3 mL Intravenous Q12H   vitamin B-12  2,000 mcg Oral Daily   warfarin  5 mg Oral ONCE-1600   Warfarin - Pharmacist Dosing Inpatient   Does not apply q1600   Continuous Infusions:  sodium chloride       LOS: 7 days    Time spent: 22 minutes    Sharen Hones, MD [facility]   To contact the attending provider between 7A-7P or the covering provider during after hours 7P-7A, please log into the web site [email or web address] and access using universal [place] password for that web site. If you do not have the password, please call the hospital operator.  09/24/2021, 12:10 PM

## 2021-09-24 NOTE — Progress Notes (Addendum)
°  Progress Note    09/24/2021 7:33 AM 6 Days Post-Op  Subjective:  no complaints  Afebrile HR 80's-90's  83'E-940'H systolic 68% RA  Vitals:   09/23/21 2244 09/24/21 0613  BP: 90/68 (!) 114/59  Pulse: 90 86  Resp: 16 17  Temp: (!) 97.5 F (36.4 C) 97.7 F (36.5 C)  SpO2: 93% 96%    Physical Exam: Cardiac:  regular Lungs:  non labored Extremities:  brisk left peroneal doppler signal; dusky toes left 1st and 2nd. Bruising on posterior thigh and behind the knee on the left.   CBC    Component Value Date/Time   WBC 17.9 (H) 09/24/2021 0608   RBC 2.90 (L) 09/24/2021 0608   HGB 7.4 (L) 09/24/2021 0608   HGB 11.8 07/21/2021 1515   HCT 23.8 (L) 09/24/2021 0608   HCT 36.8 07/21/2021 1515   PLT 564 (H) 09/24/2021 0608   PLT 439 07/21/2021 1515   MCV 82.1 09/24/2021 0608   MCV 80 07/21/2021 1515   MCH 25.5 (L) 09/24/2021 0608   MCHC 31.1 09/24/2021 0608   RDW 17.3 (H) 09/24/2021 0608   RDW 16.2 (H) 07/21/2021 1515   LYMPHSABS 2.9 09/17/2021 0406   LYMPHSABS 3.1 01/21/2020 1603   MONOABS 0.9 09/17/2021 0406   EOSABS 0.5 09/17/2021 0406   EOSABS 0.4 01/21/2020 1603   BASOSABS 0.1 09/17/2021 0406   BASOSABS 0.1 01/21/2020 1603    BMET    Component Value Date/Time   NA 133 (L) 09/23/2021 1425   NA 138 07/21/2021 1515   K 4.5 09/23/2021 1425   CL 101 09/23/2021 1425   CO2 24 09/23/2021 1425   GLUCOSE 92 09/23/2021 1425   BUN 26 (H) 09/23/2021 1425   BUN 13 07/21/2021 1515   CREATININE 1.80 (H) 09/23/2021 1425   CREATININE 1.88 (H) 02/19/2020 1555   CALCIUM 9.2 09/23/2021 1425   GFRNONAA 35 (L) 09/23/2021 1425   GFRNONAA 32 (L) 02/19/2020 1555   GFRAA 30 (L) 07/29/2020 1528   GFRAA 37 (L) 02/19/2020 1555    INR    Component Value Date/Time   INR 2.5 (H) 09/24/2021 0881     Intake/Output Summary (Last 24 hours) at 09/24/2021 0733 Last data filed at 09/24/2021 0300 Gross per 24 hour  Intake 1693.27 ml  Output --  Net 1693.27 ml      Assessment/Plan:  45 y.o. female is s/p:  thrombolysis with L CIA stent, profunda thrombectomy, popliteal thrombectomy and stenting, and peroneal stenting   6 Days Post-Op   -pt with brisk left peroneal doppler signals -pt states her friend had dates wrong and will have place to go tomorrow.   -bruising on left thigh - pt is on coumadin with therapeutic INR -DVT prophylaxis:  coumadin - INR today is 2.5 -leukocytosis of 17.9k today, which is up from yesterday.  She is not on abx-will d/w Dr. Donzetta Matters.  Do not feel her toes are source of leukocytosis.     Leontine Locket, PA-C [facility] [phone] 09/24/2021 7:33 AM  I have independently interviewed and examined patient and agree with PA assessment and plan above.  Her abscess from yesterday has resolved.  She still has breath signals in her left foot and toes remain stable.  She has increased hematoma in the posterior left thigh.  No overt source of leukocytosis.  Stana Bayon C. Donzetta Matters, MD [facility] [place] Office: [phone] Pager: [phone]

## 2021-09-24 NOTE — Progress Notes (Signed)
ANTICOAGULATION CONSULT NOTE - Follow Up Consult  Pharmacy Consult for Warfarin Indication: Hypercoag state with LE ischemia  Allergies  Allergen Reactions   Ciprofloxacin Hcl Hives   Dilaudid [Hydromorphone Hcl] Shortness Of Breath and Other (See Comments)    "Asystole," per pt report   Macrobid [Nitrofurantoin Macrocrystal] Hives, Shortness Of Breath and Rash   Other Hives, Shortness Of Breath and Rash    NO "-CILLINS"!!!   Penicillins Shortness Of Breath    Had had cephalosporins without incident Has patient had a PCN reaction causing immediate rash, facial/tongue/throat swelling, SOB or lightheadedness with hypotension: Yes Has patient had a PCN reaction causing severe rash involving mucus membranes or skin necrosis: Unk Has patient had a PCN reaction that required hospitalization: Unk Has patient had a PCN reaction occurring within the last 10 years: No If all of the above answers are "NO", then may proceed with Cephalosporin use.    Sulfa Antibiotics Hives, Shortness Of Breath and Rash   Lexapro [Escitalopram Oxalate] Other (See Comments)    "I just did not like it."    Patient Measurements: Height: 5\' 7"  (170.2 cm) Weight: 114.3 kg (252 lb) IBW/kg (Calculated) : 61.6  Vital Signs: Temp: 97.7 F (36.5 C) (12/22 0613) Temp Source: Oral (12/22 0613) BP: 114/59 (12/22 0613) Pulse Rate: 86 (12/22 0613)  Labs: Recent Labs    09/22/21 0607 09/22/21 1808 09/23/21 0325 09/23/21 1425 09/24/21 0608  HGB 7.0* 7.5* 7.1*  --  7.4*  HCT 22.7* 24.3* 22.8*  --  23.8*  PLT 446*  --  427*  --  564*  LABPROT 25.4*  --  27.9*  --  27.2*  INR 2.3*  --  2.6*  --  2.5*  CREATININE 1.78*  --  1.84* 1.80*  --     Estimated Creatinine Clearance: 51 mL/min (A) (by C-G formula based on SCr of 1.8 mg/dL (H)).   Assessment: Anticoag: PTA warfarin for LLE ischemia (admit INR 1.8), got Alteplase 12/15-16 +thrombolysis with L CIA stent, profunda thrombectomy, popliteal thrombectomy and  stenting, and peroneal stenting then IV heparin through 12/19  - d-dimer 1.14 > greater than 20 >>1.0 - L thigh bruising noted. Hgb 7.4 low but stable. Plts WNL. INR 2.5 in goal.  Goal of Therapy:  INR 2-3 Monitor platelets by anticoagulation protocol: Yes   Plan:  Repeat Coumadin 5mg  po x 1 today. Daily INR   Terri Sparks, PharmD, BCPS Clinical Staff Pharmacist [email or web address]  Alford Sparks, [employer] 09/24/2021,7:52 AM

## 2021-09-24 NOTE — Progress Notes (Signed)
Nutrition Follow-up  DOCUMENTATION CODES:   Obesity unspecified  INTERVENTION:   Continue Glucerna Shake po TID, each supplement provides 220 kcal and 10 grams of protein.  Continue MVI with minerals daily.  NUTRITION DIAGNOSIS:   Increased nutrient needs related to acute illness (COVID-19 and acute left critical lower limb ischemia) as evidenced by estimated needs.  Ongoing  GOAL:   Patient will meet greater than or equal to 90% of their needs  Progressing with intake of meals and supplements  MONITOR:   PO intake, Supplement acceptance, Labs, Skin  REASON FOR ASSESSMENT:   Consult Diet education  ASSESSMENT:   45 yo female admitted with critical left lower limb ischemia; S/P angiogram. Incidentally found to be COVID positive. PMH includes chronic hypercoagulable state, s/p multiple thrombolysis, chronic Coumadin therapy, HTN, DM-2, HLD, anxiety, tobacco use, GERD.  Patient reports good intake of meals and supplements. Currently on a heart healthy carbohydrate modified diet with 1500 ml fluid restriction. Meal intakes: 50-100% Patient is drinking Glucerna shakes TID between meals.  She is also receiving a MVI with minerals daily.  Plans for d/c to a hotel tomorrow; she does not have a home.  Labs reviewed.  CBG: 166-80  Medications reviewed and include folic acid, Novolog, Semglee, MVI with minerals, Protonix, Senokot, vitamin B-12, Coumadin.  Diet Order:   Diet Order             Diet heart healthy/carb modified Room service appropriate? Yes with Assist; Fluid consistency: Thin; Fluid restriction: 1500 mL Fluid  Diet effective now                   EDUCATION NEEDS:   Education needs have been addressed  Skin:  Skin Assessment: Reviewed RN Assessment  Last BM:  12/20  Height:   Ht Readings from Last 1 Encounters:  09/17/21 5\' 7"  (1.702 m)    Weight:   Wt Readings from Last 1 Encounters:  09/17/21 114.3 kg    BMI:  Body mass index is  39.47 kg/m.  Estimated Nutritional Needs:   Kcal:  1900-2200  Protein:  130-160 gm  Fluid:  1.9-2.2 L    Lucas Mallow, RD, LDN, CNSC Please refer to Amion for contact information.

## 2021-09-24 NOTE — Progress Notes (Signed)
Patient has large soft bruise left posterior thigh down to knee . Dr. Unk Lightning called and made aware and will look at it this a.m. on rounds. Doppler D.P . And P.T. are present.Left leg warm and dry.No pain

## 2021-09-25 ENCOUNTER — Other Ambulatory Visit (HOSPITAL_COMMUNITY): Payer: Self-pay

## 2021-09-25 DIAGNOSIS — U071 COVID-19: Secondary | ICD-10-CM | POA: Diagnosis not present

## 2021-09-25 DIAGNOSIS — I70229 Atherosclerosis of native arteries of extremities with rest pain, unspecified extremity: Secondary | ICD-10-CM | POA: Diagnosis not present

## 2021-09-25 DIAGNOSIS — N179 Acute kidney failure, unspecified: Secondary | ICD-10-CM | POA: Diagnosis not present

## 2021-09-25 DIAGNOSIS — D500 Iron deficiency anemia secondary to blood loss (chronic): Secondary | ICD-10-CM | POA: Diagnosis not present

## 2021-09-25 LAB — GLUCOSE, CAPILLARY
Glucose-Capillary: 126 mg/dL — ABNORMAL HIGH (ref 70–99)
Glucose-Capillary: 185 mg/dL — ABNORMAL HIGH (ref 70–99)
Glucose-Capillary: 67 mg/dL — ABNORMAL LOW (ref 70–99)
Glucose-Capillary: 63 mg/dL — ABNORMAL LOW (ref 70–99)
Glucose-Capillary: 125 mg/dL — ABNORMAL HIGH (ref 70–99)

## 2021-09-25 LAB — BASIC METABOLIC PANEL
Anion gap: 6 (ref 5–15)
BUN: 24 mg/dL — ABNORMAL HIGH (ref 6–20)
CO2: 24 mmol/L (ref 22–32)
Calcium: 9 mg/dL (ref 8.9–10.3)
Chloride: 102 mmol/L (ref 98–111)
Creatinine, Ser: 1.9 mg/dL — ABNORMAL HIGH (ref 0.44–1.00)
GFR, Estimated: 33 mL/min — ABNORMAL LOW (ref >60)
Glucose, Bld: 115 mg/dL — ABNORMAL HIGH (ref 70–99)
Potassium: 4.4 mmol/L (ref 3.5–5.1)
Sodium: 132 mmol/L — ABNORMAL LOW (ref 135–145)

## 2021-09-25 LAB — CBC
HCT: 22.8 % — ABNORMAL LOW (ref 36.0–46.0)
Hemoglobin: 7 g/dL — ABNORMAL LOW (ref 12.0–15.0)
MCH: 25.6 pg — ABNORMAL LOW (ref 26.0–34.0)
MCHC: 30.7 g/dL (ref 30.0–36.0)
MCV: 83.5 fL (ref 80.0–100.0)
Platelets: 653 10*3/uL — ABNORMAL HIGH (ref 150–400)
RBC: 2.73 MIL/uL — ABNORMAL LOW (ref 3.87–5.11)
RDW: 17.7 % — ABNORMAL HIGH (ref 11.5–15.5)
WBC: 17.4 10*3/uL — ABNORMAL HIGH (ref 4.0–10.5)
nRBC: 0.2 % (ref 0.0–0.2)

## 2021-09-25 LAB — PREPARE RBC (CROSSMATCH)

## 2021-09-25 LAB — PROTIME-INR
INR: 2.7 — ABNORMAL HIGH (ref 0.8–1.2)
Prothrombin Time: 28.9 seconds — ABNORMAL HIGH (ref 11.4–15.2)

## 2021-09-25 MED ORDER — SODIUM CHLORIDE 0.9% IV SOLUTION: Freq: Once | INTRAVENOUS | Status: DC

## 2021-09-25 MED ORDER — CLOPIDOGREL BISULFATE 75 MG PO TABS
75.0000 mg | ORAL_TABLET | Freq: Every day | ORAL | 3 refills | Status: DC
  Filled 2021-09-25: qty 30, 30d supply, fill #0

## 2021-09-25 MED ORDER — HYDROXYZINE HCL 25 MG PO TABS
50.0000 mg | ORAL_TABLET | Freq: Three times a day (TID) | ORAL | 0 refills | Status: AC | PRN
  Filled 2021-09-25: qty 60, 10d supply, fill #0

## 2021-09-25 MED ORDER — SODIUM CHLORIDE 0.9 % IV SOLN
250.0000 mg | Freq: Once | INTRAVENOUS | Status: AC
  Administered 2021-09-25: 15:00:00 250 mg via INTRAVENOUS
  Filled 2021-09-25: qty 20

## 2021-09-25 MED ORDER — NICOTINE 21 MG/24HR TD PT24
21.0000 mg | MEDICATED_PATCH | Freq: Every day | TRANSDERMAL | 0 refills | Status: DC
  Filled 2021-09-25: qty 28, 28d supply, fill #0

## 2021-09-25 MED ORDER — ONDANSETRON HCL 4 MG PO TABS
4.0000 mg | ORAL_TABLET | Freq: Three times a day (TID) | ORAL | 0 refills | Status: AC | PRN
  Filled 2021-09-25: qty 20, 7d supply, fill #0

## 2021-09-25 MED ORDER — OXYCODONE-ACETAMINOPHEN 5-325 MG PO TABS
1.0000 | ORAL_TABLET | Freq: Four times a day (QID) | ORAL | 0 refills | Status: DC | PRN
  Filled 2021-09-25: qty 20, 5d supply, fill #0

## 2021-09-25 MED ORDER — SODIUM CHLORIDE 0.9 % IV SOLN: 300.0000 mg | Freq: Once | INTRAVENOUS | Status: DC

## 2021-09-25 MED ORDER — ASPIRIN 81 MG PO TBEC
DELAYED_RELEASE_TABLET | Freq: Every day | ORAL | 3 refills | Status: AC
Start: 2021-09-25 — End: 2022-09-25
  Filled 2021-09-25: qty 60, 60d supply, fill #0

## 2021-09-25 NOTE — Progress Notes (Signed)
PROGRESS NOTE    Terri Sparks  WUJ:[id number] DOB: 10-12-1975 DOA: 09/17/2021 PCP: Ladell Pier, MD    Brief Narrative:  45 year old female with medical history significant for chronic hypercoagulable state with multiple arterial thrombosis and embolization, s/p multiple thrombolysis, on chronic Coumadin therapy, HTN, type II DM/IDDM, HLD, anxiety/depression, tobacco use disorder, GERD, presented with worsening left foot pain and discoloration.  Admitted by vascular surgery for acute ischemic left lower extremity for which he underwent left lower extremity angiogram with initiation of thrombolysis.  TRH were consulted for evaluation and management of incidentally positive COVID-19 PCR testing.  Has remained stable from a COVID-19 perspective.  Assisted with better control of DM.    Assessment & Plan:   Principal Problem:   Critical lower limb ischemia ([place]) Active Problems:   Peripheral vascular disease of lower extremity (HCC)   Insulin-requiring or dependent type II diabetes mellitus ([place])   Acute renal failure superimposed on stage 3a chronic kidney disease (HCC)   Iron deficiency anemia due to chronic blood loss   COVID-19 virus infection  Acute on chronic stage IIIa chronic kidney disease. Hyponatremia.   Renal function is not at baseline yet.  She received IV fluids yesterday, will continue monitor renal function.  Patient be receiving 1 unit PRBC for anemia.   Acute left lower extremity ischemia. Peripheral arterial disease. Medically stable to be discharged from vascular surgeon standpoint.    Iron deficient anemia. Globin dropped down to 7.0 again today, will give another dose of IV iron, transfuse 1 unit PRBC  Uncontrolled type 2 diabetes with hyperglycemia. Hemoglobin A1c 10.6, continue current regimen with Lantus and sliding scale insulin.  COVID infection. Patient is asymptomatic, no hypoxemia.  No need for treatment.  Intermittent dysphagia. Follow-up  with GI as outpatient.    DVT prophylaxis: Coumadin Code Status: full Family Communication:  Disposition Plan:      Status is: Inpatient   Remains inpatient appropriate because: Unsafe discharge Patient did not have set up for going home.  TOC is working on placement.       I/O last 3 completed shifts: In: 1960 [P.O.:1957; I.V.:3] Out: 0  No intake/output data recorded.      Subjective: Patient feels well today, does not have any short of breath or cough. He had a several large bowel movement after given lactulose, no abdominal pain nausea vomiting. No dysuria hematuria.  Objective: Vitals:   09/24/21 2020 09/25/21 0003 09/25/21 0415 09/25/21 0745  BP: 127/68 (!) 106/57 (!) 99/50 (!) 100/55  Pulse: 96 100 90 90  Resp: 12 17 16 16   Temp: (!) 97.5 F (36.4 C) (!) 97.4 F (36.3 C) 98 F (36.7 C) 98 F (36.7 C)  TempSrc: Oral Oral Tympanic Oral  SpO2: 97% 95% 97% 97%  Weight:      Height:        Intake/Output Summary (Last 24 hours) at 09/25/2021 1235 Last data filed at 09/25/2021 0700 Gross per 24 hour  Intake 1237 ml  Output 0 ml  Net 1237 ml   Filed Weights   09/17/21 0410  Weight: 114.3 kg    Examination:  General exam: Appears calm and comfortable  Respiratory system: Clear to auscultation. Respiratory effort normal. Cardiovascular system: S1 & S2 heard, RRR. No JVD, murmurs, rubs, gallops or clicks. No pedal edema. Gastrointestinal system: Abdomen is nondistended, soft and nontender. No organomegaly or masses felt. Normal bowel sounds heard. Central nervous system: Alert and oriented. No focal neurological deficits. Extremities: Symmetric  5 x 5 power. Skin: No rashes, lesions or ulcers Psychiatry: Judgement and insight appear normal. Mood & affect appropriate.     Data Reviewed: I have personally reviewed following labs and imaging studies  CBC: Recent Labs  Lab 09/21/21 0446 09/22/21 0607 09/22/21 1808 09/23/21 0325 09/24/21 0608  09/25/21 0359  WBC 11.5* 14.5*  --  15.4* 17.9* 17.4*  HGB 7.5* 7.0* 7.5* 7.1* 7.4* 7.0*  HCT 25.3* 22.7* 24.3* 22.8* 23.8* 22.8*  MCV 82.1 81.7  --  81.4 82.1 83.5  PLT 447* 446*  --  427* 564* 109*   Basic Metabolic Panel: Recent Labs  Lab 09/21/21 0446 09/22/21 0607 09/23/21 0325 09/23/21 1425 09/24/21 0608 09/25/21 0359  NA 132* 135 129* 133*  --  132*  K 4.5 4.9 4.8 4.5  --  4.4  CL 103 104 101 101  --  102  CO2 20* 23 20* 24  --  24  GLUCOSE 134* 166* 191* 92  --  115*  BUN 25* 24* 27* 26*  --  24*  CREATININE 1.76* 1.78* 1.84* 1.80*  --  1.90*  CALCIUM 8.9 9.1 8.9 9.2  --  9.0  MG  --   --   --   --  2.1  --    GFR: Estimated Creatinine Clearance: 48.3 mL/min (A) (by C-G formula based on SCr of 1.9 mg/dL (H)). Liver Function Tests: No results for input(s): AST, ALT, ALKPHOS, BILITOT, PROT, ALBUMIN in the last 168 hours. No results for input(s): LIPASE, AMYLASE in the last 168 hours. No results for input(s): AMMONIA in the last 168 hours. Coagulation Profile: Recent Labs  Lab 09/21/21 0446 09/22/21 0607 09/23/21 0325 09/24/21 0608 09/25/21 0359  INR 2.5* 2.3* 2.6* 2.5* 2.7*   Cardiac Enzymes: No results for input(s): CKTOTAL, CKMB, CKMBINDEX, TROPONINI in the last 168 hours. BNP (last 3 results) No results for input(s): PROBNP in the last 8760 hours. HbA1C: No results for input(s): HGBA1C in the last 72 hours. CBG: Recent Labs  Lab 09/24/21 0618 09/24/21 1135 09/24/21 1616 09/24/21 2102 09/25/21 0618  GLUCAP 166* 80 113* 147* 126*   Lipid Profile: No results for input(s): CHOL, HDL, LDLCALC, TRIG, CHOLHDL, LDLDIRECT in the last 72 hours. Thyroid Function Tests: No results for input(s): TSH, T4TOTAL, FREET4, T3FREE, THYROIDAB in the last 72 hours. Anemia Panel: No results for input(s): VITAMINB12, FOLATE, FERRITIN, TIBC, IRON, RETICCTPCT in the last 72 hours. Sepsis Labs: No results for input(s): PROCALCITON, LATICACIDVEN in the last 168  hours.  Recent Results (from the past 240 hour(s))  Resp Panel by RT-PCR (Flu A&B, Covid) Nasopharyngeal Swab     Status: Abnormal   Collection Time: 09/17/21 10:39 AM   Specimen: Nasopharyngeal Swab; Nasopharyngeal(NP) swabs in vial transport medium  Result Value Ref Range Status   SARS Coronavirus 2 by RT PCR POSITIVE (A) NEGATIVE Final    Comment: (NOTE) SARS-CoV-2 target nucleic acids are DETECTED.  The SARS-CoV-2 RNA is generally detectable in upper respiratory specimens during the acute phase of infection. Positive results are indicative of the presence of the identified virus, but do not rule out bacterial infection or co-infection with other pathogens not detected by the test. Clinical correlation with patient history and other diagnostic information is necessary to determine patient infection status. The expected result is Negative.  Fact Sheet for Patients: EntrepreneurPulse.com.au  Fact Sheet for Healthcare Providers: IncredibleEmployment.be  This test is not yet approved or cleared by the [place] and  has been authorized  for detection and/or diagnosis of SARS-CoV-2 by FDA under an Emergency Use Authorization (EUA).  This EUA will remain in effect (meaning this test can be used) for the duration of  the COVID-19 declaration under Section 564(b)(1) of the A ct, 21 U.S.C. section 360bbb-3(b)(1), unless the authorization is terminated or revoked sooner.     Influenza A by PCR NEGATIVE NEGATIVE Final   Influenza B by PCR NEGATIVE NEGATIVE Final    Comment: (NOTE) The Xpert Xpress SARS-CoV-2/FLU/RSV plus assay is intended as an aid in the diagnosis of influenza from Nasopharyngeal swab specimens and should not be used as a sole basis for treatment. Nasal washings and aspirates are unacceptable for Xpert Xpress SARS-CoV-2/FLU/RSV testing.  Fact Sheet for Patients: EntrepreneurPulse.com.au  Fact Sheet for  Healthcare Providers: IncredibleEmployment.be  This test is not yet approved or cleared by the [place] FDA and has been authorized for detection and/or diagnosis of SARS-CoV-2 by FDA under an Emergency Use Authorization (EUA). This EUA will remain in effect (meaning this test can be used) for the duration of the COVID-19 declaration under Section 564(b)(1) of the Act, 21 U.S.C. section 360bbb-3(b)(1), unless the authorization is terminated or revoked.  Performed at [facility], [place] [address] [place], [place] [address]   MRSA Next Gen by PCR, Nasal     Status: None   Collection Time: 09/17/21  5:00 PM   Specimen: Nasal Mucosa; Nasal Swab  Result Value Ref Range Status   MRSA by PCR Next Gen NOT DETECTED NOT DETECTED Final    Comment: (NOTE) The GeneXpert MRSA Assay (FDA approved for NASAL specimens only), is one component of a comprehensive MRSA colonization surveillance program. It is not intended to diagnose MRSA infection nor to guide or monitor treatment for MRSA infections. Test performance is not FDA approved in patients less than 106 years old. Performed at [facility], [place] [address], [place], [place] [address]          Radiology Studies: No results found.      Scheduled Meds:  sodium chloride   Intravenous Once   amLODipine  5 mg Oral Daily   aspirin EC  81 mg Oral Daily   atorvastatin  40 mg Oral Daily   buPROPion  150 mg Oral BID   carvedilol  3.125 mg Oral BID   Chlorhexidine Gluconate Cloth  6 each Topical Daily   clopidogrel  75 mg Oral Daily   feeding supplement (GLUCERNA SHAKE)  237 mL Oral TID BM   folic acid  1 mg Oral Daily   gabapentin  400 mg Oral TID   insulin aspart  0-20 Units Subcutaneous TID WC   insulin aspart  0-5 Units Subcutaneous QHS   insulin glargine-yfgn  45 Units Subcutaneous Daily   multivitamin with minerals  1 tablet Oral Daily   nicotine  21 mg Transdermal Daily   olopatadine  1 drop  Both Eyes BID   pantoprazole  40 mg Oral Daily   polyethylene glycol  17 g Oral Daily   senna  2 tablet Oral Daily   sertraline  150 mg Oral Daily   sodium chloride flush  3 mL Intravenous Q12H   sodium chloride flush  3 mL Intravenous Q12H   vitamin B-12  2,000 mcg Oral Daily   Warfarin - Pharmacist Dosing Inpatient   Does not apply q1600   Continuous Infusions:  sodium chloride     iron sucrose       LOS: 8  days    Time spent: 28 minutes    Sharen Hones, MD [facility]   To contact the attending provider between 7A-7P or the covering provider during after hours 7P-7A, please log into the web site [email or web address] and access using universal [place] password for that web site. If you do not have the password, please call the hospital operator.  09/25/2021, 12:35 PM

## 2021-09-25 NOTE — Progress Notes (Signed)
Inpatient Diabetes Program Recommendations  AACE/ADA: New Consensus Statement on Inpatient Glycemic Control (2015)  Target Ranges:  Prepandial:   less than 140 mg/dL      Peak postprandial:   less than 180 mg/dL (1-2 hours)      Critically ill patients:  140 - 180 mg/dL   Lab Results  Component Value Date   GLUCAP 126 (H) 09/25/2021   HGBA1C 10.6 (H) 09/17/2021     Spoke with patient again to reinforce concepts discussed previously.  Freestyle Libre applied to right upper arm. Discussed risks and benefits, target goals, when to call MD, importance of taking medications as prescribed, and how to obtain additional sensors with Medicaid. Patient asking about TOC. Will send secure chat. No further questions at this time.   Thanks, Bronson Curb, MSN, RNC-OB Diabetes Coordinator [phone] (8a-5p)

## 2021-09-25 NOTE — TOC Progression Note (Signed)
Transition of Care (TOC) - Progression Note  Marvetta Gibbons RN, BSN Transitions of Care Unit 4E- RN Case Manager See Treatment Team for direct phone #    Patient Details  Name: Terri Sparks MRN: [id number] Date of Birth: 1976-05-01  Transition of Care [facility]) CM/SW Contact  Dahlia Client Romeo Rabon, RN Phone Number: 09/25/2021, 4:02 PM  Clinical Narrative:    Notified by [facility] MD that pt would not be medically ready for discharge today, potentially tomorrow. Pt is now saying her friend can no longer assist her with motel room. TOC has provided patient with list for area shelters, and given her info on [facility] that will be in operation this weekend due to severe cold weather. Pt voiced understanding and appreciation for info.  CM also provided pt with info on [facility] transport that can assist pt with transportation to medical appointments that are with [facility]- pt will need to call them to schedule 24 hr in advance.   Pt in addition indicated that she could not afford her medications for discharge- CM spoke with [facility] pharmacist- Megan regarding her Medicaid copay- due to her type of Managed Medicaid coverage- her copay can not be waived. TOC has covered her copay cost for discharge meds (with the exception of Nicotine patches) with TOC pedi cash. [facility] pharmacy will hold meds in [place] for discharge- Unit staff will need to pick up meds for pt prior to discharge.     Expected Discharge Plan: Homeless Shelter Barriers to Discharge: Ship broker, SNF Pending bed offer, Homeless with medical needs  Expected Discharge Plan and Services Expected Discharge Plan: Homeless Shelter In-house Referral: Clinical Social Work Discharge Planning Services: CM Consult Post Acute Care Choice: NA Living arrangements for the past 2 months: Hotel/Motel                 DME Arranged: Walker rolling DME Agency: [employer] Date DME Agency Contacted: 09/25/21 Time DME Agency  Contacted: 1100 Representative spoke with at DME Agency: Freda Munro HH Arranged: NA [place] Agency: NA         Social Determinants of Health ([place]) Interventions    Readmission Risk Interventions Readmission Risk Prevention Plan 09/25/2021 01/04/2021  Transportation Screening Complete Complete  Medication Review Press photographer) Complete Complete  PCP or Specialist appointment within 3-5 days of discharge - Complete  HRI or [place] Complete Complete  SW Recovery Care/Counseling Consult Complete Complete  Palliative Care Screening Not Applicable Complete  [facility] [place] Not Applicable Not Applicable  Some recent data might be hidden

## 2021-09-25 NOTE — Progress Notes (Signed)
ANTICOAGULATION CONSULT NOTE - Follow Up Consult  Pharmacy Consult for Warfarin Indication: Hypercoag state with LE ischemia  Allergies  Allergen Reactions   Ciprofloxacin Hcl Hives   Dilaudid [Hydromorphone Hcl] Shortness Of Breath and Other (See Comments)    "Asystole," per pt report   Macrobid [Nitrofurantoin Macrocrystal] Hives, Shortness Of Breath and Rash   Other Hives, Shortness Of Breath and Rash    NO "-CILLINS"!!!   Penicillins Shortness Of Breath    Had had cephalosporins without incident Has patient had a PCN reaction causing immediate rash, facial/tongue/throat swelling, SOB or lightheadedness with hypotension: Yes Has patient had a PCN reaction causing severe rash involving mucus membranes or skin necrosis: Unk Has patient had a PCN reaction that required hospitalization: Unk Has patient had a PCN reaction occurring within the last 10 years: No If all of the above answers are "NO", then may proceed with Cephalosporin use.    Sulfa Antibiotics Hives, Shortness Of Breath and Rash   Lexapro [Escitalopram Oxalate] Other (See Comments)    "I just did not like it."    Patient Measurements: Height: 5\' 7"  (170.2 cm) Weight: 114.3 kg (252 lb) IBW/kg (Calculated) : 61.6  Vital Signs: Temp: 98 F (36.7 C) (12/23 0745) Temp Source: Oral (12/23 0745) BP: 100/55 (12/23 0745) Pulse Rate: 90 (12/23 0745)  Labs: Recent Labs    09/23/21 0325 09/23/21 1425 09/24/21 0608 09/25/21 0359  HGB 7.1*  --  7.4* 7.0*  HCT 22.8*  --  23.8* 22.8*  PLT 427*  --  564* 653*  LABPROT 27.9*  --  27.2* 28.9*  INR 2.6*  --  2.5* 2.7*  CREATININE 1.84* 1.80*  --  1.90*    Estimated Creatinine Clearance: 48.3 mL/min (A) (by C-G formula based on SCr of 1.9 mg/dL (H)).   Assessment: 45 yo W on warfarin PTA for LLE ischemia (admit INR 1.8), got Alteplase 12/15-16 +thrombolysis with L CIA stent, profunda thrombectomy, popliteal thrombectomy and stenting, and peroneal stenting. Patient  with transportation difficulties for following up as outpatient. [facility] stopped refills in September due to not following up. Transportation resources given to patient.  Patient was on IV heparin peri-procedure and bridge. Pharmacy consulted for warfarin 12/17.   INR 2.7 is at goal. H/H 7 stable since procedure, plt 600. Patient with L thigh hematoma. Charted 0-50% PO intake. Noted patient with heavy menses, which was the same on warfarin as on apixaban previously.   Warfarin PTA: 10mg  MWF, 5mg  TTSS   Goal of Therapy:  INR 2-3 Monitor platelets by anticoagulation protocol: Yes   Plan:  Schedule warfarin 5mg  daily   Daily INR, decrease to Mon/Thur if remains stable on 5mg  Monitor s/sx bleeding   Benetta Spar, PharmD, BCPS, BCCP Clinical Pharmacist  Please check AMION for all [place] phone numbers After 10:00 PM, call [place]

## 2021-09-25 NOTE — Progress Notes (Addendum)
°  Progress Note    09/25/2021 10:58 AM 7 Days Post-Op  Subjective:  no major complaints. She is presently having her menstrual cycle and concerned about passing large clots. She has had this happen before with being on Coumadin. Still trying to figure out her plans for discharge   Vitals:   09/25/21 0415 09/25/21 0745  BP: (!) 99/50 (!) 100/55  Pulse: 90 90  Resp: 16 16  Temp: 98 F (36.7 C) 98 F (36.7 C)  SpO2: 97% 97%   Physical Exam: Cardiac:  regular Lungs: non labored Extremities:  left medial, posterior thigh and left lateral leg with ecchymosis Abdomen: obese, soft non tender Neurologic: alert and oriented  CBC    Component Value Date/Time   WBC 17.4 (H) 09/25/2021 0359   RBC 2.73 (L) 09/25/2021 0359   HGB 7.0 (L) 09/25/2021 0359   HGB 11.8 07/21/2021 1515   HCT 22.8 (L) 09/25/2021 0359   HCT 36.8 07/21/2021 1515   PLT 653 (H) 09/25/2021 0359   PLT 439 07/21/2021 1515   MCV 83.5 09/25/2021 0359   MCV 80 07/21/2021 1515   MCH 25.6 (L) 09/25/2021 0359   MCHC 30.7 09/25/2021 0359   RDW 17.7 (H) 09/25/2021 0359   RDW 16.2 (H) 07/21/2021 1515   LYMPHSABS 2.9 09/17/2021 0406   LYMPHSABS 3.1 01/21/2020 1603   MONOABS 0.9 09/17/2021 0406   EOSABS 0.5 09/17/2021 0406   EOSABS 0.4 01/21/2020 1603   BASOSABS 0.1 09/17/2021 0406   BASOSABS 0.1 01/21/2020 1603    BMET    Component Value Date/Time   NA 132 (L) 09/25/2021 0359   NA 138 07/21/2021 1515   K 4.4 09/25/2021 0359   CL 102 09/25/2021 0359   CO2 24 09/25/2021 0359   GLUCOSE 115 (H) 09/25/2021 0359   BUN 24 (H) 09/25/2021 0359   BUN 13 07/21/2021 1515   CREATININE 1.90 (H) 09/25/2021 0359   CREATININE 1.88 (H) 02/19/2020 1555   CALCIUM 9.0 09/25/2021 0359   GFRNONAA 33 (L) 09/25/2021 0359   GFRNONAA 32 (L) 02/19/2020 1555   GFRAA 30 (L) 07/29/2020 1528   GFRAA 37 (L) 02/19/2020 1555    INR    Component Value Date/Time   INR 2.7 (H) 09/25/2021 0359     Intake/Output Summary (Last 24  hours) at 09/25/2021 1058 Last data filed at 09/25/2021 0700 Gross per 24 hour  Intake 1237 ml  Output 0 ml  Net 1237 ml     Assessment/Plan:  45 y.o. female is s/p thrombolysis with L CIA stent, profunda thrombectomy, popliteal thrombectomy and stenting, and peroneal stenting   7 Days Post-Op   Left lower extremity remains well perfused with doppler PT/ DP signals Bruising unchanged to left thigh Passing some large clots per patient on her menstrual cycle Blood pressure soft Hgb 7. Will transfuse 1 unit Afebrile. Leukocytosis stable She is stable for discharge from our standpoint. She is still trying to figure out her dispo plans. Possibly has hotel to go to this evening. May be stable for discharge post transfusion   Marval Regal [facility] [phone] 09/25/2021 10:58 AM   I have independently interviewed and examined patient and agree with PA assessment and plan above. Transfuse 1 unit prbc's. D/c when dispo arranged.   Sapir Lavey C. Donzetta Matters, MD [facility] [place] Office: [phone] Pager: [phone]

## 2021-09-25 NOTE — Progress Notes (Signed)
PT Cancellation Note  Patient Details Name: Terri Sparks MRN: [id number] DOB: 1976-06-30   Cancelled Treatment:    Reason Eval/Treat Not Completed: Other (comment) - patient mobilizing mod indep in room without assist, reports no further acute PT needs, no further questions or concerns; will sign off per pt request.  Mabeline Caras, PT, DPT Acute Rehabilitation Services  Pager [phone] Office [place] 09/25/2021, 12:45 PM

## 2021-09-26 DIAGNOSIS — E119 Type 2 diabetes mellitus without complications: Secondary | ICD-10-CM

## 2021-09-26 DIAGNOSIS — N179 Acute kidney failure, unspecified: Secondary | ICD-10-CM | POA: Diagnosis not present

## 2021-09-26 DIAGNOSIS — Z794 Long term (current) use of insulin: Secondary | ICD-10-CM

## 2021-09-26 DIAGNOSIS — U071 COVID-19: Secondary | ICD-10-CM | POA: Diagnosis not present

## 2021-09-26 DIAGNOSIS — I70229 Atherosclerosis of native arteries of extremities with rest pain, unspecified extremity: Secondary | ICD-10-CM | POA: Diagnosis not present

## 2021-09-26 LAB — BPAM RBC
Blood Product Expiration Date: 202212272359
Blood Product Expiration Date: 202212312359
ISSUE DATE / TIME: 202212201205
ISSUE DATE / TIME: 202212231817
Unit Type and Rh: 9500
Unit Type and Rh: 9500

## 2021-09-26 LAB — BASIC METABOLIC PANEL
Anion gap: 7 (ref 5–15)
Anion gap: 8 (ref 5–15)
BUN: 26 mg/dL — ABNORMAL HIGH (ref 6–20)
BUN: 26 mg/dL — ABNORMAL HIGH (ref 6–20)
CO2: 26 mmol/L (ref 22–32)
CO2: 25 mmol/L (ref 22–32)
Calcium: 9.1 mg/dL (ref 8.9–10.3)
Calcium: 9.2 mg/dL (ref 8.9–10.3)
Chloride: 101 mmol/L (ref 98–111)
Chloride: 100 mmol/L (ref 98–111)
Creatinine, Ser: 1.63 mg/dL — ABNORMAL HIGH (ref 0.44–1.00)
Creatinine, Ser: 1.61 mg/dL — ABNORMAL HIGH (ref 0.44–1.00)
GFR, Estimated: 39 mL/min — ABNORMAL LOW (ref >60)
GFR, Estimated: 40 mL/min — ABNORMAL LOW (ref >60)
Glucose, Bld: 101 mg/dL — ABNORMAL HIGH (ref 70–99)
Glucose, Bld: 121 mg/dL — ABNORMAL HIGH (ref 70–99)
Potassium: 4.4 mmol/L (ref 3.5–5.1)
Potassium: 5.1 mmol/L (ref 3.5–5.1)
Sodium: 134 mmol/L — ABNORMAL LOW (ref 135–145)
Sodium: 133 mmol/L — ABNORMAL LOW (ref 135–145)

## 2021-09-26 LAB — GLUCOSE, CAPILLARY
Glucose-Capillary: 156 mg/dL — ABNORMAL HIGH (ref 70–99)
Glucose-Capillary: 137 mg/dL — ABNORMAL HIGH (ref 70–99)

## 2021-09-26 LAB — CBC
HCT: 24.2 % — ABNORMAL LOW (ref 36.0–46.0)
Hemoglobin: 7.8 g/dL — ABNORMAL LOW (ref 12.0–15.0)
MCH: 27 pg (ref 26.0–34.0)
MCHC: 32.2 g/dL (ref 30.0–36.0)
MCV: 83.7 fL (ref 80.0–100.0)
Platelets: 660 10*3/uL — ABNORMAL HIGH (ref 150–400)
RBC: 2.89 MIL/uL — ABNORMAL LOW (ref 3.87–5.11)
RDW: 17.6 % — ABNORMAL HIGH (ref 11.5–15.5)
WBC: 21 10*3/uL — ABNORMAL HIGH (ref 4.0–10.5)
nRBC: 0.2 % (ref 0.0–0.2)

## 2021-09-26 LAB — TYPE AND SCREEN
ABO/RH(D): O NEG
Antibody Screen: NEGATIVE
Unit division: 0
Unit division: 0

## 2021-09-26 LAB — PROTIME-INR
INR: 2.2 — ABNORMAL HIGH (ref 0.8–1.2)
Prothrombin Time: 24.7 seconds — ABNORMAL HIGH (ref 11.4–15.2)

## 2021-09-26 MED ORDER — WARFARIN SODIUM 5 MG PO TABS: 5.0000 mg | ORAL_TABLET | Freq: Once | ORAL | Status: DC

## 2021-09-26 NOTE — Progress Notes (Signed)
ANTICOAGULATION CONSULT NOTE - Follow Up Consult  Pharmacy Consult for Warfarin Indication: Hypercoag state with LE ischemia  Allergies  Allergen Reactions   Ciprofloxacin Hcl Hives   Dilaudid [Hydromorphone Hcl] Shortness Of Breath and Other (See Comments)    "Asystole," per pt report   Macrobid [Nitrofurantoin Macrocrystal] Hives, Shortness Of Breath and Rash   Other Hives, Shortness Of Breath and Rash    NO "-CILLINS"!!!   Penicillins Shortness Of Breath    Had had cephalosporins without incident Has patient had a PCN reaction causing immediate rash, facial/tongue/throat swelling, SOB or lightheadedness with hypotension: Yes Has patient had a PCN reaction causing severe rash involving mucus membranes or skin necrosis: Unk Has patient had a PCN reaction that required hospitalization: Unk Has patient had a PCN reaction occurring within the last 10 years: No If all of the above answers are "NO", then may proceed with Cephalosporin use.    Sulfa Antibiotics Hives, Shortness Of Breath and Rash   Lexapro [Escitalopram Oxalate] Other (See Comments)    "I just did not like it."    Patient Measurements: Height: 5\' 7"  (170.2 cm) Weight: 73 kg (160 lb 14.4 oz) IBW/kg (Calculated) : 61.6  Vital Signs: Temp: 97.6 F (36.4 C) (12/24 0846) Temp Source: Oral (12/24 0846) BP: 108/51 (12/24 0846) Pulse Rate: 82 (12/24 0846)  Labs: Recent Labs    09/24/21 0608 09/25/21 0359 09/26/21 0219 09/26/21 0751  HGB 7.4* 7.0* 7.8*  --   HCT 23.8* 22.8* 24.2*  --   PLT 564* 653* 660*  --   LABPROT 27.2* 28.9* 24.7*  --   INR 2.5* 2.7* 2.2*  --   CREATININE  --  1.90* 1.63* 1.61*     Estimated Creatinine Clearance: 42.5 mL/min (A) (by C-G formula based on SCr of 1.61 mg/dL (H)).   Assessment: 45 yo W on warfarin PTA for LLE ischemia (admit INR 1.8), got Alteplase 12/15-16 +thrombolysis with L CIA stent, profunda thrombectomy, popliteal thrombectomy and stenting, and peroneal stenting.  Patient with transportation difficulties for following up as outpatient. [facility] stopped refills in September due to not following up. Transportation resources given to patient.  Patient was on IV heparin peri-procedure and bridge. Pharmacy consulted for warfarin 12/17.   INR 2.2 at goal. H/H 7 stable since procedure, plt 660. Noted patient with heavy menses, which was the same on warfarin as on apixaban previously. Of note, the patient did not receive her dose of warfarin yesterday.   Warfarin PTA: 10mg  MWF, 5mg  TTSS   Goal of Therapy:  INR 2-3 Monitor platelets by anticoagulation protocol: Yes   Plan:  Warfarin 5 mg x1 today Daily INR, decrease to Mon/Thur if remains stable Monitor s/sx bleeding   Thank you for including pharmacy in the care of this patient.  Zenaida Deed, PharmD PGY1 Acute Care Pharmacy Resident  Phone: [phone] 09/26/2021  9:15 AM  Please check [email or web address] for unit-specific pharmacy phone numbers.

## 2021-09-26 NOTE — Progress Notes (Signed)
[facility] [place] PROGRESS NOTE  ASSESSMENT / PLAN: Terri Sparks is a 45 y.o. female status post endovascular catheter directed thrombolysis and endovascular recanalization of thrombosed left lower extremity 12/15 - 12/16. Continue maximal anticoagulation (ASA / Plavix / Coumadin). She has a safe place to stay. Safe for discharge. Follow up with Dr. Donzetta Matters in 2-3 weeks.    SUBJECTIVE: No complaints. Found a place to stay. Wants to leave.  OBJECTIVE: BP (!) 108/51 (BP Location: Left Arm)    Pulse 82    Temp 97.6 F (36.4 C) (Oral)    Resp 20    Ht 5\' 7"  (1.702 m)    Wt 73 kg    SpO2 90%    BMI 25.20 kg/m   Intake/Output Summary (Last 24 hours) at 09/26/2021 1118 Last data filed at 09/26/2021 0700 Gross per 24 hour  Intake 1036 ml  Output 0 ml  Net 1036 ml    No acute distress Left foot warm. Toes appear to be improving. Good doppler flow in the foot.   CBC Latest Ref Rng & Units 09/26/2021 09/25/2021 09/24/2021  WBC 4.0 - 10.5 K/uL 21.0(H) 17.4(H) 17.9(H)  Hemoglobin 12.0 - 15.0 g/dL 7.8(L) 7.0(L) 7.4(L)  Hematocrit 36.0 - 46.0 % 24.2(L) 22.8(L) 23.8(L)  Platelets 150 - 400 K/uL 660(H) 653(H) 564(H)     CMP Latest Ref Rng & Units 09/26/2021 09/26/2021 09/25/2021  Glucose 70 - 99 mg/dL 121(H) 101(H) 115(H)  BUN 6 - 20 mg/dL 26(H) 26(H) 24(H)  Creatinine 0.44 - 1.00 mg/dL 1.61(H) 1.63(H) 1.90(H)  Sodium 135 - 145 mmol/L 133(L) 134(L) 132(L)  Potassium 3.5 - 5.1 mmol/L 5.1 4.4 4.4  Chloride 98 - 111 mmol/L 100 101 102  CO2 22 - 32 mmol/L 25 26 24   Calcium 8.9 - 10.3 mg/dL 9.2 9.1 9.0  Total Protein 6.5 - 8.1 g/dL - - -  Total Bilirubin 0.3 - 1.2 mg/dL - - -  Alkaline Phos 38 - 126 U/L - - -  AST 15 - 41 U/L - - -  ALT 0 - 44 U/L - - -    Estimated Creatinine Clearance: 42.5 mL/min (A) (by C-G formula based on SCr of 1.61 mg/dL (H)).  Terri Sparks. Stanford Breed, MD [facility] Phone Number: [phone] 09/26/2021 11:18 AM

## 2021-09-26 NOTE — Progress Notes (Signed)
PROGRESS NOTE    Terri Sparks  WFU:[id number] DOB: 1976-07-11 DOA: 09/17/2021 PCP: Ladell Pier, MD    Brief Narrative:  45 year old female with medical history significant for chronic hypercoagulable state with multiple arterial thrombosis and embolization, s/p multiple thrombolysis, on chronic Coumadin therapy, HTN, type II DM/IDDM, HLD, anxiety/depression, tobacco use disorder, GERD, presented with worsening left foot pain and discoloration.  Admitted by vascular surgery for acute ischemic left lower extremity for which he underwent left lower extremity angiogram with initiation of thrombolysis.  [facility] were consulted for evaluation and management of incidentally positive COVID-19 PCR testing.  Has remained stable from a COVID-19 perspective.  Assisted with better control of DM.    Assessment & Plan:   Principal Problem:   Critical lower limb ischemia ([place]) Active Problems:   Peripheral vascular disease of lower extremity (HCC)   Insulin-requiring or dependent type II diabetes mellitus ([place])   Acute renal failure superimposed on stage 3a chronic kidney disease (HCC)   Iron deficiency anemia due to chronic blood loss   COVID-19 virus infection  Acute on chronic stage IIIa chronic kidney disease. Hyponatremia.   Renal function still stable, sodium level is better.   Acute left lower extremity ischemia. Peripheral arterial disease. Medically stable to be discharged from vascular surgeon standpoint.    Iron deficient anemia. Patient received IV iron and RBC transfusion yesterday again.  We will start oral iron polysaturated daily.  Uncontrolled type 2 diabetes with hyperglycemia. Hemoglobin A1c 10.6, resume home regimen, patient is on higher dose of Lantus, she will for need to follow-up with PCP as outpatient.   COVID infection. Patient is asymptomatic, no hypoxemia.  No need for treatment.  Intermittent dysphagia. Follow-up with Dr. Michail Sermon as  outpatient.  Leukocytosis. Patient does not have any evidence of infection at this time.   Patient is medically stable from my standpoint to discharge. She has a hotel room booked and paid for.         I/O last 3 completed shifts: In: 2273 [P.O.:1947; Blood:326] Out: 0  No intake/output data recorded.     Subjective: Patient doing well today, he has some intermittent leg pain.  She denies any short of breath or cough. She does not have any abdominal pain or nausea vomiting.  She had multiple bowel movements after giving lactulose yesterday. No fever or chills.   Objective: Vitals:   09/25/21 2342 09/26/21 0407 09/26/21 0606 09/26/21 0846  BP: (!) 99/53 (!) 97/46  (!) 108/51  Pulse: 92 82  82  Resp: 17 16  20   Temp: 98 F (36.7 C) (!) 97.4 F (36.3 C)  97.6 F (36.4 C)  TempSrc: Oral Oral  Oral  SpO2: 90% 95%  90%  Weight:   73 kg   Height:        Intake/Output Summary (Last 24 hours) at 09/26/2021 1015 Last data filed at 09/26/2021 0700 Gross per 24 hour  Intake 1036 ml  Output 0 ml  Net 1036 ml   Filed Weights   09/17/21 0410 09/26/21 0606  Weight: 114.3 kg 73 kg    Examination:  General exam: Appears calm and comfortable  Respiratory system: Clear to auscultation. Respiratory effort normal. Cardiovascular system: S1 & S2 heard, RRR. No JVD, murmurs, rubs, gallops or clicks. No pedal edema. Gastrointestinal system: Abdomen is nondistended, soft and nontender. No organomegaly or masses felt. Normal bowel sounds heard. Central nervous system: Alert and oriented. No focal neurological deficits. Extremities: Symmetric 5 x 5 power.  Skin: No rashes, lesions or ulcers Psychiatry: Judgement and insight appear normal. Mood & affect appropriate.     Data Reviewed: I have personally reviewed following labs and imaging studies  CBC: Recent Labs  Lab 09/22/21 0607 09/22/21 1808 09/23/21 0325 09/24/21 0608 09/25/21 0359 09/26/21 0219  WBC 14.5*  --   15.4* 17.9* 17.4* 21.0*  HGB 7.0* 7.5* 7.1* 7.4* 7.0* 7.8*  HCT 22.7* 24.3* 22.8* 23.8* 22.8* 24.2*  MCV 81.7  --  81.4 82.1 83.5 83.7  PLT 446*  --  427* 564* 653* 672*   Basic Metabolic Panel: Recent Labs  Lab 09/23/21 0325 09/23/21 1425 09/24/21 0608 09/25/21 0359 09/26/21 0219 09/26/21 0751  NA 129* 133*  --  132* 134* 133*  K 4.8 4.5  --  4.4 4.4 5.1  CL 101 101  --  102 101 100  CO2 20* 24  --  24 26 25   GLUCOSE 191* 92  --  115* 101* 121*  BUN 27* 26*  --  24* 26* 26*  CREATININE 1.84* 1.80*  --  1.90* 1.63* 1.61*  CALCIUM 8.9 9.2  --  9.0 9.1 9.2  MG  --   --  2.1  --   --   --    GFR: Estimated Creatinine Clearance: 42.5 mL/min (A) (by C-G formula based on SCr of 1.61 mg/dL (H)). Liver Function Tests: No results for input(s): AST, ALT, ALKPHOS, BILITOT, PROT, ALBUMIN in the last 168 hours. No results for input(s): LIPASE, AMYLASE in the last 168 hours. No results for input(s): AMMONIA in the last 168 hours. Coagulation Profile: Recent Labs  Lab 09/22/21 0607 09/23/21 0325 09/24/21 0608 09/25/21 0359 09/26/21 0219  INR 2.3* 2.6* 2.5* 2.7* 2.2*   Cardiac Enzymes: No results for input(s): CKTOTAL, CKMB, CKMBINDEX, TROPONINI in the last 168 hours. BNP (last 3 results) No results for input(s): PROBNP in the last 8760 hours. HbA1C: No results for input(s): HGBA1C in the last 72 hours. CBG: Recent Labs  Lab 09/25/21 1237 09/25/21 1827 09/25/21 1907 09/25/21 2006 09/26/21 0605  GLUCAP 185* 67* 63* 125* 156*   Lipid Profile: No results for input(s): CHOL, HDL, LDLCALC, TRIG, CHOLHDL, LDLDIRECT in the last 72 hours. Thyroid Function Tests: No results for input(s): TSH, T4TOTAL, FREET4, T3FREE, THYROIDAB in the last 72 hours. Anemia Panel: No results for input(s): VITAMINB12, FOLATE, FERRITIN, TIBC, IRON, RETICCTPCT in the last 72 hours. Sepsis Labs: No results for input(s): PROCALCITON, LATICACIDVEN in the last 168 hours.  Recent Results (from the past  240 hour(s))  Resp Panel by RT-PCR (Flu A&B, Covid) Nasopharyngeal Swab     Status: Abnormal   Collection Time: 09/17/21 10:39 AM   Specimen: Nasopharyngeal Swab; Nasopharyngeal(NP) swabs in vial transport medium  Result Value Ref Range Status   SARS Coronavirus 2 by RT PCR POSITIVE (A) NEGATIVE Final    Comment: (NOTE) SARS-CoV-2 target nucleic acids are DETECTED.  The SARS-CoV-2 RNA is generally detectable in upper respiratory specimens during the acute phase of infection. Positive results are indicative of the presence of the identified virus, but do not rule out bacterial infection or co-infection with other pathogens not detected by the test. Clinical correlation with patient history and other diagnostic information is necessary to determine patient infection status. The expected result is Negative.  Fact Sheet for Patients: EntrepreneurPulse.com.au  Fact Sheet for Healthcare Providers: IncredibleEmployment.be  This test is not yet approved or cleared by the [place] FDA and  has been authorized for detection and/or diagnosis  of SARS-CoV-2 by FDA under an Emergency Use Authorization (EUA).  This EUA will remain in effect (meaning this test can be used) for the duration of  the COVID-19 declaration under Section 564(b)(1) of the A ct, 21 U.S.C. section 360bbb-3(b)(1), unless the authorization is terminated or revoked sooner.     Influenza A by PCR NEGATIVE NEGATIVE Final   Influenza B by PCR NEGATIVE NEGATIVE Final    Comment: (NOTE) The Xpert Xpress SARS-CoV-2/FLU/RSV plus assay is intended as an aid in the diagnosis of influenza from Nasopharyngeal swab specimens and should not be used as a sole basis for treatment. Nasal washings and aspirates are unacceptable for Xpert Xpress SARS-CoV-2/FLU/RSV testing.  Fact Sheet for Patients: EntrepreneurPulse.com.au  Fact Sheet for Healthcare  Providers: IncredibleEmployment.be  This test is not yet approved or cleared by the [place] FDA and has been authorized for detection and/or diagnosis of SARS-CoV-2 by FDA under an Emergency Use Authorization (EUA). This EUA will remain in effect (meaning this test can be used) for the duration of the COVID-19 declaration under Section 564(b)(1) of the Act, 21 U.S.C. section 360bbb-3(b)(1), unless the authorization is terminated or revoked.  Performed at [facility], [place] [address] [place], [place] [address]   MRSA Next Gen by PCR, Nasal     Status: None   Collection Time: 09/17/21  5:00 PM   Specimen: Nasal Mucosa; Nasal Swab  Result Value Ref Range Status   MRSA by PCR Next Gen NOT DETECTED NOT DETECTED Final    Comment: (NOTE) The GeneXpert MRSA Assay (FDA approved for NASAL specimens only), is one component of a comprehensive MRSA colonization surveillance program. It is not intended to diagnose MRSA infection nor to guide or monitor treatment for MRSA infections. Test performance is not FDA approved in patients less than 69 years old. Performed at [facility], [place] [address] [place], [place] [address]          Radiology Studies: No results found.      Scheduled Meds:  sodium chloride   Intravenous Once   amLODipine  5 mg Oral Daily   aspirin EC  81 mg Oral Daily   atorvastatin  40 mg Oral Daily   buPROPion  150 mg Oral BID   carvedilol  3.125 mg Oral BID   Chlorhexidine Gluconate Cloth  6 each Topical Daily   clopidogrel  75 mg Oral Daily   feeding supplement (GLUCERNA SHAKE)  237 mL Oral TID BM   folic acid  1 mg Oral Daily   gabapentin  400 mg Oral TID   insulin aspart  0-20 Units Subcutaneous TID WC   insulin aspart  0-5 Units Subcutaneous QHS   insulin glargine-yfgn  45 Units Subcutaneous Daily   multivitamin with minerals  1 tablet Oral Daily   nicotine  21 mg Transdermal Daily   olopatadine  1 drop Both Eyes  BID   pantoprazole  40 mg Oral Daily   polyethylene glycol  17 g Oral Daily   senna  2 tablet Oral Daily   sertraline  150 mg Oral Daily   sodium chloride flush  3 mL Intravenous Q12H   sodium chloride flush  3 mL Intravenous Q12H   vitamin B-12  2,000 mcg Oral Daily   warfarin  5 mg Oral ONCE-1600   Warfarin - Pharmacist Dosing Inpatient   Does not apply q1600   Continuous Infusions:  sodium chloride       LOS: 9 days  Time spent: 25 minutes    Sharen Hones, MD [facility]   To contact the attending provider between 7A-7P or the covering provider during after hours 7P-7A, please log into the web site [email or web address] and access using universal [place] password for that web site. If you do not have the password, please call the hospital operator.  09/26/2021, 10:15 AM

## 2021-09-26 NOTE — Progress Notes (Signed)
One unit of blood transfusion completed without complication. Vital remains table, afebrile, no acute distress. We will follow lab CBC at am.  Pt stated she had a huge blood clot from her period today. Denied current black tardy or bloody stool. Pt reported she has had multiple bowel movements yesterday, and 3 times today but more solid mixed with watery stool. No abdominal pain.   Pt has good appetite. She ordered 3 dinner trays tonight. Diet and fluid restriction with glycemic controlled instruction given.   Left leg pain scale 8-9/10. Bruising on left leg, MD aware. No active bleeding or hematoma. Percocet given q 4 hrs PRN. Recommended transitioning to oral pain med before discharge. Pt tolerated well. Pulses detected good signals via doppler on DP and PT bilaterally. We will continue to monitor.  Kennyth Lose, RN

## 2021-09-27 NOTE — Discharge Summary (Addendum)
Physician Discharge Summary  Patient ID: Terri Sparks MRN: [id number] DOB/AGE: [id number] y.o.  Admit date: 09/17/2021 Discharge date: 09/26/2021  Admission Diagnoses:  Discharge Diagnoses:  Principal Problem:   Critical lower limb ischemia ([place]) Active Problems:   Peripheral vascular disease of lower extremity ([place])   Insulin-requiring or dependent type II diabetes mellitus ([place])   Acute renal failure superimposed on stage 3a chronic kidney disease (H[facility])   Iron deficiency anemia due to chronic blood loss   COVID-19 virus infection   Discharged Condition: good  Hospital Course:  45 year old female with medical history significant for chronic hypercoagulable state with multiple arterial thrombosis and embolization, s/p multiple thrombolysis, on chronic Coumadin therapy, HTN, type II DM/IDDM, HLD, anxiety/depression, tobacco use disorder, GERD, presented with worsening left foot pain and discoloration.  Admitted by vascular surgery for acute ischemic left lower extremity for which he underwent left lower extremity angiogram with initiation of thrombolysis.  TRH were consulted for evaluation and management of incidentally positive COVID-19 PCR testing.  Has remained stable from a COVID-19 perspective.  Assisted with better control of DM.   Acute on chronic stage IIIa chronic kidney disease. Hyponatremia.   Renal function still stable, sodium level is better.   Acute left lower extremity ischemia. Peripheral arterial disease. Medically stable to be discharged from vascular surgeon standpoint.    Iron deficient anemia. Patient received IV iron and RBC transfusion yesterday again.  We will start oral iron polysaturated daily.  Uncontrolled type 2 diabetes with hyperglycemia. Hemoglobin A1c 10.6, resume home regimen, patient is on higher dose of Lantus, she will for need to follow-up with PCP as outpatient.   COVID infection. Patient is asymptomatic, no hypoxemia.  No need  for treatment.  Intermittent dysphagia. Follow-up with Dr. Michail Sermon as outpatient.   Leukocytosis. Patient does not have any evidence of infection at this time.  Consults:   Significant Diagnostic Studies:   Treatments: vascular intervention  Discharge Exam: Blood pressure (!) 108/51, pulse 82, temperature 97.6 F (36.4 C), temperature source Oral, resp. rate 20, height $RemoveBe'5\' 7"'yCWnrSJGh$  (1.702 m), weight 73 kg, SpO2 90 %. General appearance: alert and cooperative Resp: clear to auscultation bilaterally Cardio: regular rate and rhythm, S1, S2 normal, no murmur, click, rub or gallop GI: soft, non-tender; bowel sounds normal; no masses,  no organomegaly Extremities: extremities normal, atraumatic, no cyanosis or edema  Disposition: Discharge disposition: 01-Home or Self Care       Discharge Instructions     Diet - low sodium heart healthy   Complete by: As directed    Increase activity slowly   Complete by: As directed       Allergies as of 09/26/2021       Reactions   Ciprofloxacin Hcl Hives   Dilaudid [hydromorphone Hcl] Shortness Of Breath, Other (See Comments)   "Asystole," per pt report   Macrobid [nitrofurantoin Macrocrystal] Hives, Shortness Of Breath, Rash   Other Hives, Shortness Of Breath, Rash   NO "-CILLINS"!!!   Penicillins Shortness Of Breath   Had had cephalosporins without incident Has patient had a PCN reaction causing immediate rash, facial/tongue/throat swelling, SOB or lightheadedness with hypotension: Yes Has patient had a PCN reaction causing severe rash involving mucus membranes or skin necrosis: Unk Has patient had a PCN reaction that required hospitalization: Unk Has patient had a PCN reaction occurring within the last 10 years: No If all of the above answers are "NO", then may proceed with Cephalosporin use.   Sulfa Antibiotics Hives, Shortness  Of Breath, Rash   Lexapro [escitalopram Oxalate] Other (See Comments)   "I just did not like it."         Medication List     STOP taking these medications    Accu-Chek Guide Me w/Device Kit   Accu-Chek Softclix Lancets lancets   acetaminophen 500 MG tablet Commonly known as: TYLENOL   ferrous sulfate 325 (65 FE) MG tablet   fexofenadine 60 MG tablet Commonly known as: ALLEGRA   folic acid 1 MG tablet Commonly known as: Transport planner System Misc   Insulin Syringes (Disposable) U-100 1 ML Misc   PenTips 32G X 4 MM Misc Generic drug: Insulin Pen Needle   sucralfate 1 GM/10ML suspension Commonly known as: CARAFATE   vitamin B-12 1000 MCG tablet Commonly known as: CYANOCOBALAMIN       TAKE these medications    Accu-Chek Guide test strip Generic drug: glucose blood Use as instructed   amLODipine 5 MG tablet Commonly known as: NORVASC Take 1 tablet (5 mg total) by mouth daily.   Aspirin Low Dose 81 MG EC tablet Generic drug: aspirin TAKE 1 TABLET (81 MG TOTAL) BY MOUTH DAILY. SWALLOW WHOLE. What changed: additional instructions   atorvastatin 40 MG tablet Commonly known as: LIPITOR TAKE 1 TABLET (40 MG TOTAL) BY MOUTH DAILY.   buPROPion 150 MG 12 hr tablet Commonly known as: WELLBUTRIN SR TAKE 1 TABLET (150 MG TOTAL) BY MOUTH 2 (TWO) TIMES DAILY.   carvedilol 3.125 MG tablet Commonly known as: COREG TAKE ONE TABLET BY MOUTH TWICE DAILY What changed: when to take this   clonazePAM 1 MG tablet Commonly known as: KLONOPIN Take 1 tablet (1 mg total) by mouth 3 (three) times daily as needed for anxiety.   clopidogrel 75 MG tablet Commonly known as: PLAVIX Take 1 tablet (75 mg total) by mouth daily.   FreeStyle Libre 2 Reader [employer] Check blood sugar TID.   gabapentin 400 MG capsule Commonly known as: NEURONTIN TAKE 1 CAPSULE (400 MG TOTAL) BY MOUTH 3 (THREE) TIMES DAILY.   hydrOXYzine 25 MG tablet Commonly known as: ATARAX Take 2 tablets (50 mg total) by mouth 3 (three) times daily as needed  for anxiety. What changed:  medication strength reasons to take this   Lantus SoloStar 100 UNIT/ML Solostar Pen Generic drug: insulin glargine Inject 65 Units into the skin daily. What changed: how much to take   nicotine 21 mg/24hr patch Commonly known as: NICODERM CQ - dosed in mg/24 hours Place 1 patch (21 mg total) onto the skin daily.   nitroGLYCERIN 0.4 MG SL tablet Commonly known as: NITROSTAT PLACE 1 TABLET (0.4 MG TOTAL) UNDER THE TONGUE EVERY 5 (FIVE) MINUTES AS NEEDED FOR CHEST PAIN. What changed: how much to take   NovoLOG FlexPen 100 UNIT/ML FlexPen Generic drug: insulin aspart Use as directed per sliding scale, Pt is instructed about direction, Maximum 45 units per day  CBG 70 - 120: 0 units CBG 121 - 150: 2 units CBG 151 - 200: 3 units CBG 201 - 250: 5 units CBG 251 - 300: 8 units CBG 301 - 350: 11 units CBG 351 - 400: 15 units CBG > 400: Call Primary provider What changed:  how much to take how to take this when to take this   olopatadine 0.1 % ophthalmic solution Commonly known as: Patanol Place 1 drop into both eyes 2 (two) times daily. What changed:  when  to take this reasons to take this   ondansetron 4 MG tablet Commonly known as: Zofran Take 1 tablet (4 mg total) by mouth every 8 (eight) hours as needed for nausea or vomiting.   oxyCODONE-acetaminophen 5-325 MG tablet Commonly known as: PERCOCET/ROXICET Take 1 tablet by mouth every 6 (six) hours as needed for moderate pain.   sertraline 100 MG tablet Commonly known as: ZOLOFT TAKE 1 AND 1/2 TABLET (150 MG) BY MOUTH DAILY. What changed: See the new instructions.   traZODone 100 MG tablet Commonly known as: DESYREL TAKE 1 TABLET (100 MG TOTAL) BY MOUTH AT BEDTIME.   TRUEplus Insulin Syringe 31G X 5/16" 1 ML Misc Generic drug: Insulin Syringe-Needle U-100 USE AS DIRECTED   Ventolin HFA 108 (90 Base) MCG/ACT inhaler Generic drug: albuterol INHALE TWO PUFFS BY MOUTH EVERY SIX HOURS AS NEEDED  FOR WHEEZING OR SHORTNESS OF BREATH What changed: See the new instructions.   warfarin 5 MG tablet Commonly known as: COUMADIN Take as directed. If you are unsure how to take this medication, talk to your nurse or doctor. Original instructions: TAKE ONE TABLET ( 5 MG) BY MOUTH AT NOON ON SUN/TUES/THURS/SAT AND 2 TABLETS (10 MG) ON MON/WED/FRI What changed: See the new instructions.        Follow-up Information     [facility] Follow up.   Why: rolling walker arranged- to be delivered to room prior to discharge Contact information: [address] [place] [address] [phone]         [facility] Follow up in 1 month(s).   Why: The office will call the patient with an appointment Contact information: [address] [place]        Ladell Pier, MD Follow up.   Specialty: Internal Medicine Contact information: [place] [address] [phone]         [facility] transportation. Call.   Why: They will assist with medical appointments to [facility] providers- you will need to call them within 24 hr in advance of appointment to schedule ride. Contact information: [phone]        Wilford Corner, MD Follow up in 1 month(s).   Specialty: Gastroenterology Contact information: [address] [place] [address] [phone]                 Signed: Sharen Hones 09/27/2021, 12:42 PM

## 2021-09-29 ENCOUNTER — Telehealth: Payer: Self-pay | Admitting: Internal Medicine

## 2021-09-29 ENCOUNTER — Telehealth: Payer: Self-pay

## 2021-09-29 ENCOUNTER — Other Ambulatory Visit: Payer: Self-pay | Admitting: Internal Medicine

## 2021-09-29 ENCOUNTER — Other Ambulatory Visit: Payer: Self-pay

## 2021-09-29 DIAGNOSIS — Z86718 Personal history of other venous thrombosis and embolism: Secondary | ICD-10-CM

## 2021-09-29 NOTE — Telephone Encounter (Signed)
See encounter below.  

## 2021-09-29 NOTE — Telephone Encounter (Signed)
Transition Care Management Unsuccessful Follow-up Telephone Call  Date of discharge and from where:  09/26/2021, [facility]   Attempts:  1st Attempt  Reason for unsuccessful TCM follow-up call:  Unable to leave message phone rings, then goes right to fast busy # [phone]. Attempted to call twice.    She has a hospital follow up appointment at [facility] with Epimenio Sarin, PA on 10/22/2021

## 2021-09-29 NOTE — Telephone Encounter (Signed)
Medication sent to pharmacy on 09/29/21.
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
## 2021-09-29 NOTE — Telephone Encounter (Signed)
Transition Care Management Unsuccessful Follow-up Telephone Call  Date of discharge and from where:  09/26/2021 from [facility]  Attempts:  1st Attempt  Reason for unsuccessful TCM follow-up call:  No answer/busy

## 2021-09-29 NOTE — Telephone Encounter (Signed)
Patient is calling to schedule appt with [facility]. Attempted to contact the office. No answer at this time.

## 2021-09-30 ENCOUNTER — Telehealth: Payer: Self-pay

## 2021-09-30 NOTE — Telephone Encounter (Signed)
Transition Care Management Unsuccessful Follow-up Telephone Call  Date of discharge and from where:  09/26/2021 from [facility]  Attempts:  2nd Attempt  Reason for unsuccessful TCM follow-up call:  No answer/busy

## 2021-09-30 NOTE — Telephone Encounter (Signed)
Transition Care Management Unsuccessful Follow-up Telephone Call  Date of discharge and from where:  09/26/2021, [facility]   Attempts:  2nd Attempt  Reason for unsuccessful TCM follow-up call:  Unable to reach patient - called # [phone] and the phone rings, then goes right to fast busy. Call placed to # [phone], her son answered and said she was not home.  Left message with him to have her call this CM back when she returns.   She has a hospital follow up appointment at [facility] with Epimenio Sarin, PA on 10/22/2021

## 2021-10-01 ENCOUNTER — Other Ambulatory Visit: Payer: Self-pay | Admitting: Internal Medicine

## 2021-10-01 ENCOUNTER — Telehealth: Payer: Self-pay

## 2021-10-01 DIAGNOSIS — Z86718 Personal history of other venous thrombosis and embolism: Secondary | ICD-10-CM

## 2021-10-01 DIAGNOSIS — E1169 Type 2 diabetes mellitus with other specified complication: Secondary | ICD-10-CM

## 2021-10-01 NOTE — Telephone Encounter (Signed)
Transition Care Management Follow-up Telephone Call  Date of discharge and from where: 09/26/2021, [facility]  How have you been since you were released from the hospital? She said she is feeling okay. She reported some pain in her legs and said her her LLE is black and blue from when the clots were removed.  Any questions or concerns? Yes - she said most of her medications are missing. She has the warfarin and insulins but nothing else and she is concerned about missing her behavioral health medications. She said it is too early to have the medications refilled. Instructed her to call Christy Sartorius, [place] and request an over-ride from her insurance company to have the prescriptions filled. She is aware that she may also need to notify her behavioral health provider at [facility]  that she is doesn't have any behavioral health meds.  She was given a [employer] when she was in the hospital and the reader is not working. She does not have a glucometer and is not able to administer novolog per sliding scale. She said she was not instructed about scheduling and INR check   Items Reviewed: Did the pt receive and understand the discharge instructions provided? Yes  Medications obtained and verified?  Issues with medications noted above.  Other? No  Any new allergies since your discharge? No  Dietary orders reviewed? Yes - she said she has experienced difficulty eating and has only been drinking diabetic shakes.  Do you have support at home?  She is currently living at a motel with her son.   Home Care and Equipment/Supplies: Were home health services ordered? no If so, what is the name of the agency? N/a  Has the agency set up a time to come to the patient's home? not applicable Were any new equipment or medical supplies ordered?  Yes: RW What is the name of the medical supply agency? [employer]  Were you able to get the supplies/equipment? yes Do you have any questions related to  the use of the equipment or supplies? No questions but she thinks she needs a cane  Functional Questionnaire: (I = Independent and D = Dependent) ADLs: independent. Uses the RW as needed with ambulation.    Follow up appointments reviewed:  PCP Hospital f/u appt confirmed? Yes  Scheduled to see Dr Wynetta Emery  - 10/06/2021  Specialist Hospital f/u appt confirmed? Yes  Scheduled to see [facility] - 11/11/2021. Are transportation arrangements needed? Yes  - ride arranged through [employer] to her appointment with Dr Wynetta Emery 10/06/2021. Address at [place] confirmed. Ride ID # [id number]. She needs to contact [employer] to schedule rides to upcoming medical appointments.  If their condition worsens, is the pt aware to call PCP or go to the Emergency Dept.? Yes Was the patient provided with contact information for the PCP's office or ED? Yes Was to pt encouraged to call back with questions or concerns? Yes

## 2021-10-01 NOTE — Telephone Encounter (Signed)
Transition Care Management Unsuccessful Follow-up Telephone Call  Date of discharge and from where:  09/26/2021 from [facility]  Attempts:  3rd Attempt  Reason for unsuccessful TCM follow-up call:  Unable to reach patient

## 2021-10-06 ENCOUNTER — Ambulatory Visit: Payer: Medicaid Other | Admitting: Internal Medicine

## 2021-10-11 ENCOUNTER — Inpatient Hospital Stay (HOSPITAL_COMMUNITY): Payer: Medicaid Other

## 2021-10-11 ENCOUNTER — Inpatient Hospital Stay (HOSPITAL_COMMUNITY)
Admission: EM | Admit: 2021-10-11 | Discharge: 2021-10-15 | DRG: 871 | Discharge status: Against Medical Advice | Payer: Medicaid Other | Attending: Internal Medicine | Admitting: Internal Medicine

## 2021-10-11 ENCOUNTER — Emergency Department (HOSPITAL_COMMUNITY): Payer: Medicaid Other

## 2021-10-11 ENCOUNTER — Other Ambulatory Visit: Payer: Self-pay

## 2021-10-11 DIAGNOSIS — G459 Transient cerebral ischemic attack, unspecified: Secondary | ICD-10-CM | POA: Diagnosis not present

## 2021-10-11 DIAGNOSIS — L089 Local infection of the skin and subcutaneous tissue, unspecified: Secondary | ICD-10-CM

## 2021-10-11 DIAGNOSIS — F1910 Other psychoactive substance abuse, uncomplicated: Secondary | ICD-10-CM

## 2021-10-11 DIAGNOSIS — E1152 Type 2 diabetes mellitus with diabetic peripheral angiopathy with gangrene: Secondary | ICD-10-CM | POA: Diagnosis present

## 2021-10-11 DIAGNOSIS — Z79899 Other long term (current) drug therapy: Secondary | ICD-10-CM | POA: Diagnosis not present

## 2021-10-11 DIAGNOSIS — E669 Obesity, unspecified: Secondary | ICD-10-CM | POA: Diagnosis present

## 2021-10-11 DIAGNOSIS — Z7982 Long term (current) use of aspirin: Secondary | ICD-10-CM | POA: Diagnosis not present

## 2021-10-11 DIAGNOSIS — Z86718 Personal history of other venous thrombosis and embolism: Secondary | ICD-10-CM

## 2021-10-11 DIAGNOSIS — L03115 Cellulitis of right lower limb: Secondary | ICD-10-CM | POA: Diagnosis not present

## 2021-10-11 DIAGNOSIS — F419 Anxiety disorder, unspecified: Secondary | ICD-10-CM

## 2021-10-11 DIAGNOSIS — N12 Tubulo-interstitial nephritis, not specified as acute or chronic: Secondary | ICD-10-CM

## 2021-10-11 DIAGNOSIS — Z7902 Long term (current) use of antithrombotics/antiplatelets: Secondary | ICD-10-CM

## 2021-10-11 DIAGNOSIS — E785 Hyperlipidemia, unspecified: Secondary | ICD-10-CM | POA: Diagnosis present

## 2021-10-11 DIAGNOSIS — Z8616 Personal history of COVID-19: Secondary | ICD-10-CM | POA: Diagnosis not present

## 2021-10-11 DIAGNOSIS — R609 Edema, unspecified: Secondary | ICD-10-CM | POA: Diagnosis not present

## 2021-10-11 DIAGNOSIS — Z7984 Long term (current) use of oral hypoglycemic drugs: Secondary | ICD-10-CM | POA: Diagnosis not present

## 2021-10-11 DIAGNOSIS — Z8249 Family history of ischemic heart disease and other diseases of the circulatory system: Secondary | ICD-10-CM

## 2021-10-11 DIAGNOSIS — Z833 Family history of diabetes mellitus: Secondary | ICD-10-CM | POA: Diagnosis not present

## 2021-10-11 DIAGNOSIS — N1832 Chronic kidney disease, stage 3b: Secondary | ICD-10-CM | POA: Diagnosis present

## 2021-10-11 DIAGNOSIS — I129 Hypertensive chronic kidney disease with stage 1 through stage 4 chronic kidney disease, or unspecified chronic kidney disease: Secondary | ICD-10-CM | POA: Diagnosis present

## 2021-10-11 DIAGNOSIS — E11628 Type 2 diabetes mellitus with other skin complications: Secondary | ICD-10-CM | POA: Diagnosis not present

## 2021-10-11 DIAGNOSIS — D631 Anemia in chronic kidney disease: Secondary | ICD-10-CM | POA: Diagnosis present

## 2021-10-11 DIAGNOSIS — G9341 Metabolic encephalopathy: Secondary | ICD-10-CM | POA: Diagnosis present

## 2021-10-11 DIAGNOSIS — D649 Anemia, unspecified: Secondary | ICD-10-CM | POA: Diagnosis not present

## 2021-10-11 DIAGNOSIS — Z6839 Body mass index (BMI) 39.0-39.9, adult: Secondary | ICD-10-CM

## 2021-10-11 DIAGNOSIS — Z794 Long term (current) use of insulin: Secondary | ICD-10-CM | POA: Diagnosis not present

## 2021-10-11 DIAGNOSIS — F1410 Cocaine abuse, uncomplicated: Secondary | ICD-10-CM | POA: Diagnosis present

## 2021-10-11 DIAGNOSIS — E119 Type 2 diabetes mellitus without complications: Secondary | ICD-10-CM

## 2021-10-11 DIAGNOSIS — N136 Pyonephrosis: Secondary | ICD-10-CM | POA: Diagnosis present

## 2021-10-11 DIAGNOSIS — I10 Essential (primary) hypertension: Secondary | ICD-10-CM | POA: Diagnosis not present

## 2021-10-11 DIAGNOSIS — E1122 Type 2 diabetes mellitus with diabetic chronic kidney disease: Secondary | ICD-10-CM | POA: Diagnosis present

## 2021-10-11 DIAGNOSIS — L03032 Cellulitis of left toe: Secondary | ICD-10-CM | POA: Diagnosis not present

## 2021-10-11 DIAGNOSIS — K219 Gastro-esophageal reflux disease without esophagitis: Secondary | ICD-10-CM | POA: Diagnosis present

## 2021-10-11 DIAGNOSIS — Z7901 Long term (current) use of anticoagulants: Secondary | ICD-10-CM

## 2021-10-11 DIAGNOSIS — D6859 Other primary thrombophilia: Secondary | ICD-10-CM | POA: Diagnosis present

## 2021-10-11 DIAGNOSIS — N390 Urinary tract infection, site not specified: Secondary | ICD-10-CM

## 2021-10-11 DIAGNOSIS — I70222 Atherosclerosis of native arteries of extremities with rest pain, left leg: Secondary | ICD-10-CM | POA: Diagnosis present

## 2021-10-11 DIAGNOSIS — F32A Depression, unspecified: Secondary | ICD-10-CM

## 2021-10-11 DIAGNOSIS — A419 Sepsis, unspecified organism: Secondary | ICD-10-CM | POA: Diagnosis present

## 2021-10-11 DIAGNOSIS — E1165 Type 2 diabetes mellitus with hyperglycemia: Secondary | ICD-10-CM | POA: Diagnosis present

## 2021-10-11 DIAGNOSIS — N1330 Unspecified hydronephrosis: Secondary | ICD-10-CM | POA: Diagnosis not present

## 2021-10-11 DIAGNOSIS — F17210 Nicotine dependence, cigarettes, uncomplicated: Secondary | ICD-10-CM | POA: Diagnosis present

## 2021-10-11 DIAGNOSIS — R079 Chest pain, unspecified: Secondary | ICD-10-CM | POA: Diagnosis not present

## 2021-10-11 DIAGNOSIS — R739 Hyperglycemia, unspecified: Secondary | ICD-10-CM | POA: Diagnosis not present

## 2021-10-11 DIAGNOSIS — L0390 Cellulitis, unspecified: Secondary | ICD-10-CM | POA: Diagnosis present

## 2021-10-11 DIAGNOSIS — M549 Dorsalgia, unspecified: Secondary | ICD-10-CM | POA: Diagnosis not present

## 2021-10-11 DIAGNOSIS — F1210 Cannabis abuse, uncomplicated: Secondary | ICD-10-CM | POA: Diagnosis present

## 2021-10-11 DIAGNOSIS — L03116 Cellulitis of left lower limb: Secondary | ICD-10-CM

## 2021-10-11 LAB — RESP PANEL BY RT-PCR (FLU A&B, COVID) ARPGX2
Influenza A by PCR: NEGATIVE
Influenza B by PCR: NEGATIVE
SARS Coronavirus 2 by RT PCR: NEGATIVE

## 2021-10-11 LAB — URINALYSIS, MICROSCOPIC (REFLEX)
RBC / HPF: >50 RBC/hpf (ref 0–5)
WBC, UA: >50 WBC/hpf (ref 0–5)

## 2021-10-11 LAB — URINALYSIS, ROUTINE W REFLEX MICROSCOPIC
Glucose, UA: 100 mg/dL — AB
Ketones, ur: NEGATIVE mg/dL
Nitrite: POSITIVE — AB
Protein, ur: 100 mg/dL — AB
Specific Gravity, Urine: 1.02 (ref 1.005–1.030)
pH: 6.5 (ref 5.0–8.0)

## 2021-10-11 LAB — CBC WITH DIFFERENTIAL/PLATELET
Abs Immature Granulocytes: 0 10*3/uL (ref 0.00–0.07)
Basophils Absolute: 0 10*3/uL (ref 0.0–0.1)
Basophils Relative: 0 %
Eosinophils Absolute: 0.5 10*3/uL (ref 0.0–0.5)
Eosinophils Relative: 4 %
HCT: 36.2 % (ref 36.0–46.0)
Hemoglobin: 11.1 g/dL — ABNORMAL LOW (ref 12.0–15.0)
Lymphocytes Relative: 29 %
Lymphs Abs: 3.6 10*3/uL (ref 0.7–4.0)
MCH: 29.4 pg (ref 26.0–34.0)
MCHC: 30.7 g/dL (ref 30.0–36.0)
MCV: 95.8 fL (ref 80.0–100.0)
Monocytes Absolute: 0.6 10*3/uL (ref 0.1–1.0)
Monocytes Relative: 5 %
Neutro Abs: 7.7 10*3/uL (ref 1.7–7.7)
Neutrophils Relative %: 62 %
Platelets: 499 10*3/uL — ABNORMAL HIGH (ref 150–400)
RBC: 3.78 MIL/uL — ABNORMAL LOW (ref 3.87–5.11)
RDW: 25.8 % — ABNORMAL HIGH (ref 11.5–15.5)
WBC: 12.4 10*3/uL — ABNORMAL HIGH (ref 4.0–10.5)
nRBC: 0 % (ref 0.0–0.2)
nRBC: 0 /100 WBC

## 2021-10-11 LAB — COMPREHENSIVE METABOLIC PANEL
ALT: 14 U/L (ref 0–44)
AST: 13 U/L — ABNORMAL LOW (ref 15–41)
Albumin: 3.1 g/dL — ABNORMAL LOW (ref 3.5–5.0)
Alkaline Phosphatase: 83 U/L (ref 38–126)
Anion gap: 8 (ref 5–15)
BUN: 13 mg/dL (ref 6–20)
CO2: 25 mmol/L (ref 22–32)
Calcium: 10 mg/dL (ref 8.9–10.3)
Chloride: 102 mmol/L (ref 98–111)
Creatinine, Ser: 1.59 mg/dL — ABNORMAL HIGH (ref 0.44–1.00)
GFR, Estimated: 40 mL/min — ABNORMAL LOW (ref >60)
Glucose, Bld: 235 mg/dL — ABNORMAL HIGH (ref 70–99)
Potassium: 4.6 mmol/L (ref 3.5–5.1)
Sodium: 135 mmol/L (ref 135–145)
Total Bilirubin: 0.4 mg/dL (ref 0.3–1.2)
Total Protein: 7.8 g/dL (ref 6.5–8.1)

## 2021-10-11 LAB — RAPID URINE DRUG SCREEN, HOSP PERFORMED
Amphetamines: NOT DETECTED
Barbiturates: NOT DETECTED
Benzodiazepines: NOT DETECTED
Cocaine: POSITIVE — AB
Opiates: NOT DETECTED
Tetrahydrocannabinol: POSITIVE — AB

## 2021-10-11 LAB — CBG MONITORING, ED
Glucose-Capillary: 253 mg/dL — ABNORMAL HIGH (ref 70–99)
Glucose-Capillary: 215 mg/dL — ABNORMAL HIGH (ref 70–99)
Glucose-Capillary: 208 mg/dL — ABNORMAL HIGH (ref 70–99)

## 2021-10-11 LAB — I-STAT BETA HCG BLOOD, ED (MC, WL, AP ONLY): I-stat hCG, quantitative: <5 m[IU]/mL (ref <5)

## 2021-10-11 LAB — GLUCOSE, CAPILLARY: Glucose-Capillary: 168 mg/dL — ABNORMAL HIGH (ref 70–99)

## 2021-10-11 LAB — SEDIMENTATION RATE: Sed Rate: 53 mm/hr — ABNORMAL HIGH (ref 0–22)

## 2021-10-11 LAB — TROPONIN I (HIGH SENSITIVITY)
Troponin I (High Sensitivity): 5 ng/L (ref <18)
Troponin I (High Sensitivity): 5 ng/L (ref <18)

## 2021-10-11 LAB — LACTIC ACID, PLASMA: Lactic Acid, Venous: 0.9 mmol/L (ref 0.5–1.9)

## 2021-10-11 LAB — PROTIME-INR
INR: 2.2 — ABNORMAL HIGH (ref 0.8–1.2)
Prothrombin Time: 24.2 seconds — ABNORMAL HIGH (ref 11.4–15.2)

## 2021-10-11 LAB — C-REACTIVE PROTEIN: CRP: 6.8 mg/dL — ABNORMAL HIGH (ref <1.0)

## 2021-10-11 LAB — PREGNANCY, URINE: Preg Test, Ur: NEGATIVE

## 2021-10-11 MED ORDER — VANCOMYCIN HCL 10 G IV SOLR
2250.0000 mg | Freq: Once | INTRAVENOUS | Status: AC
  Administered 2021-10-11: 2250 mg via INTRAVENOUS
  Filled 2021-10-11: qty 22.5

## 2021-10-11 MED ORDER — ATORVASTATIN CALCIUM 40 MG PO TABS
40.0000 mg | ORAL_TABLET | Freq: Every day | ORAL | Status: DC
Start: 2021-10-11 — End: 2021-10-15
  Administered 2021-10-11 – 2021-10-15 (×5): 40 mg via ORAL
  Filled 2021-10-11 (×5): qty 1

## 2021-10-11 MED ORDER — METRONIDAZOLE 500 MG/100ML IV SOLN
500.0000 mg | Freq: Two times a day (BID) | INTRAVENOUS | Status: DC
  Administered 2021-10-11 – 2021-10-14 (×7): 500 mg via INTRAVENOUS
  Filled 2021-10-11 (×7): qty 100

## 2021-10-11 MED ORDER — SERTRALINE HCL 50 MG PO TABS
150.0000 mg | ORAL_TABLET | Freq: Every morning | ORAL | Status: DC
  Administered 2021-10-12 – 2021-10-15 (×4): 150 mg via ORAL
  Filled 2021-10-11 (×4): qty 1

## 2021-10-11 MED ORDER — SODIUM CHLORIDE 0.9 % IV SOLN
2.0000 g | INTRAVENOUS | Status: DC
  Administered 2021-10-11 – 2021-10-14 (×4): 2 g via INTRAVENOUS
  Filled 2021-10-11 (×4): qty 20

## 2021-10-11 MED ORDER — INSULIN ASPART 100 UNIT/ML IJ SOLN
0.0000 [IU] | Freq: Three times a day (TID) | INTRAMUSCULAR | Status: DC
  Administered 2021-10-11 (×2): 5 [IU] via SUBCUTANEOUS
  Administered 2021-10-12 (×3): 3 [IU] via SUBCUTANEOUS
  Administered 2021-10-13: 5 [IU] via SUBCUTANEOUS
  Administered 2021-10-13 (×2): 3 [IU] via SUBCUTANEOUS
  Administered 2021-10-14 (×2): 5 [IU] via SUBCUTANEOUS
  Administered 2021-10-14: 3 [IU] via SUBCUTANEOUS
  Administered 2021-10-15: 2 [IU] via SUBCUTANEOUS
  Administered 2021-10-15: 5 [IU] via SUBCUTANEOUS

## 2021-10-11 MED ORDER — ASPIRIN EC 81 MG PO TBEC
81.0000 mg | DELAYED_RELEASE_TABLET | Freq: Every day | ORAL | Status: DC
  Administered 2021-10-11 – 2021-10-15 (×5): 81 mg via ORAL
  Filled 2021-10-11 (×5): qty 1

## 2021-10-11 MED ORDER — BUPROPION HCL ER (SR) 150 MG PO TB12
150.0000 mg | ORAL_TABLET | Freq: Two times a day (BID) | ORAL | Status: DC
  Administered 2021-10-11 – 2021-10-15 (×8): 150 mg via ORAL
  Filled 2021-10-11 (×9): qty 1

## 2021-10-11 MED ORDER — CLOPIDOGREL BISULFATE 75 MG PO TABS
75.0000 mg | ORAL_TABLET | Freq: Every day | ORAL | Status: DC
  Administered 2021-10-11 – 2021-10-15 (×5): 75 mg via ORAL
  Filled 2021-10-11 (×5): qty 1

## 2021-10-11 MED ORDER — HYDROXYZINE HCL 25 MG PO TABS: 50.0000 mg | ORAL_TABLET | Freq: Three times a day (TID) | ORAL | Status: DC | PRN

## 2021-10-11 MED ORDER — SODIUM CHLORIDE 0.9% FLUSH
3.0000 mL | Freq: Two times a day (BID) | INTRAVENOUS | Status: DC
  Administered 2021-10-11 – 2021-10-14 (×6): 3 mL via INTRAVENOUS

## 2021-10-11 MED ORDER — NICOTINE 21 MG/24HR TD PT24
21.0000 mg | MEDICATED_PATCH | Freq: Every day | TRANSDERMAL | Status: DC
  Administered 2021-10-11 – 2021-10-15 (×5): 21 mg via TRANSDERMAL
  Filled 2021-10-11 (×5): qty 1

## 2021-10-11 MED ORDER — GABAPENTIN 400 MG PO CAPS
400.0000 mg | ORAL_CAPSULE | Freq: Three times a day (TID) | ORAL | Status: DC | PRN
  Administered 2021-10-12 – 2021-10-15 (×7): 400 mg via ORAL
  Filled 2021-10-11 (×7): qty 1

## 2021-10-11 MED ORDER — INSULIN GLARGINE-YFGN 100 UNIT/ML ~~LOC~~ SOLN
20.0000 [IU] | Freq: Every day | SUBCUTANEOUS | Status: DC
  Administered 2021-10-11 – 2021-10-14 (×4): 20 [IU] via SUBCUTANEOUS
  Filled 2021-10-11 (×5): qty 0.2

## 2021-10-11 MED ORDER — ALBUTEROL SULFATE (2.5 MG/3ML) 0.083% IN NEBU
2.5000 mg | INHALATION_SOLUTION | Freq: Four times a day (QID) | RESPIRATORY_TRACT | Status: DC | PRN
  Filled 2021-10-11 (×2): qty 3

## 2021-10-11 MED ORDER — OXYCODONE-ACETAMINOPHEN 5-325 MG PO TABS
0.5000 | ORAL_TABLET | Freq: Four times a day (QID) | ORAL | Status: DC | PRN
  Administered 2021-10-11 – 2021-10-12 (×3): 0.5 via ORAL
  Filled 2021-10-11 (×3): qty 1

## 2021-10-11 MED ORDER — VANCOMYCIN HCL IN DEXTROSE 1-5 GM/200ML-% IV SOLN
1000.0000 mg | INTRAVENOUS | Status: DC
  Administered 2021-10-12 – 2021-10-13 (×2): 1000 mg via INTRAVENOUS
  Filled 2021-10-11 (×3): qty 200

## 2021-10-11 MED ORDER — CLONAZEPAM 0.5 MG PO TABS
0.5000 mg | ORAL_TABLET | Freq: Three times a day (TID) | ORAL | Status: DC | PRN
  Administered 2021-10-11 – 2021-10-12 (×2): 0.5 mg via ORAL
  Filled 2021-10-11 (×2): qty 1

## 2021-10-11 NOTE — Consult Note (Signed)
Hospital Consult   Reason for Consult: Left foot wounds Requesting Physician: Dr. Langston Masker MRN #:  [id number]  History of Present Illness: This is a 46 y.o. female well-known to the vascular surgery service having undergone multiple left lower extremity percutaneous interventions for acute limb ischemia.  Patient was most recently revascularized last month having undergone lytic therapy for 2 days.  At the time of hospital discharge, she had a posterior tibial artery signal.  Legrand Como had a long discussion with Bayyinah regarding her prognosis, especially as she is 46 years old.  Kayleigh was aware that she is maximally revascularized.   Alla presented overnight with new onset foot wounds.  She stated she stubbed her toe multiple times and have been keeping the areas moist.  The erythema began 2 days ago.  She denied fevers, chills.  On exam, Ressie was rather sedated.  Nodding off while we were discussing her care.  She denied pain in the right foot or sensorimotor deficits.  Past Medical History:  Diagnosis Date   Anxiety    Depression    Diabetes ([facility])    Type 2   GERD (gastroesophageal reflux disease)    History of blood clots    ,DVT-left leg, and early 2000, had blood clot behind left breast   History of kidney stones    Hypercalcemia    Hyperparathyroidism    Hypertension    had been on Lisinopril and PCP took her off it 4 months ago   Iron deficiency anemia    Left thyroid nodule    Mass of right ovary    Nephrolithiasis    PAOD (peripheral arterial occlusive disease) (HCC)    Peripheral vascular disease (HCC)    Prolonged Q-T interval on ECG 04/19/2019   Renal calculi    Substance abuse ([place])    Tooth loose    top tooth from the right  is loose     Past Surgical History:  Procedure Laterality Date   ABDOMINAL AORTOGRAM W/LOWER EXTREMITY N/A 11/14/2017   Procedure: ABDOMINAL AORTOGRAM W/LOWER EXTREMITY;  Surgeon: Waynetta Sandy, MD;  Location: [place] [facility];   Service: Cardiovascular;  Laterality: N/A;   ANGIOPLASTY ILLIAC ARTERY Left 02/25/2018   Procedure: BALLOON ANGIOPLASTY LEFT ILIAC ARTERY;  Surgeon: Conrad Sidney, MD;  Location: [place];  Service: Vascular;  Laterality: Left;   APPLICATION OF WOUND VAC Left 03/03/2018   Procedure: APPLICATION OF WOUND VAC;  Surgeon: Angelia Mould, MD;  Location: [facility];  Service: Vascular;  Laterality: Left;   CYSTOSCOPY W/ URETERAL STENT PLACEMENT Right 06/27/2018   Procedure: CYSTOSCOPY WITH RETROGRADE PYELOGRAM/URETERAL DOUBLE J STENT PLACEMENT;  Surgeon: Ceasar Mons, MD;  Location: [place];  Service: Urology;  Laterality: Right;   CYSTOSCOPY WITH RETROGRADE PYELOGRAM, URETEROSCOPY AND STENT PLACEMENT Right 04/03/2020   Procedure: CYSTOSCOPY WITH RIGHT  RETROGRADE PYELOGRAM, URETEROSCOPY WITH HOLMIUM LASER AND STENT EXCHANGE PLACEMENT;  Surgeon: Ardis Hughs, MD;  Location: [facility];  Service: Urology;  Laterality: Right;   CYSTOSCOPY WITH STENT PLACEMENT N/A 02/23/2019   Procedure: CYSTOSCOPY WITH RIGHT URETERAL STENT EXCHANGE/ LEFT URETERAL STENT PLACEMENT;  Surgeon: Ceasar Mons, MD;  Location: [facility];  Service: Urology;  Laterality: N/A;   CYSTOSCOPY WITH STENT PLACEMENT Right 06/11/2019   Procedure: CYSTOSCOPY,RIGHT RETROGRADE WITH STENT PLACEMENT;  Surgeon: Irine Seal, MD;  Location: [facility];  Service: Urology;  Laterality: Right;   CYSTOSCOPY WITH STENT PLACEMENT Right 03/03/2020   Procedure: CYSTOSCOPY WITH STENT PLACEMENT retroagrade pylerogram;  Surgeon: Ardis Hughs, MD;  Location: [facility];  Service: Urology;  Laterality: Right;   CYSTOSCOPY/RETROGRADE/URETEROSCOPY/STONE EXTRACTION WITH BASKET Bilateral 05/01/2019   Procedure: CYSTOSCOPY/URETEROSCOPY/STONE EXTRACTION / LASER LITHOTRIPSY, BILATERAL URETEROSCOPY WITH URETERAL STONE EXTRACTION AND STENT EXCHANGE;  Surgeon: Ceasar Mons, MD;  [facility];  Service: Urology;  Laterality:  Bilateral;   CYSTOSCOPY/URETEROSCOPY/HOLMIUM LASER/STENT PLACEMENT Right 06/27/2019   Procedure: CYSTOSCOPY/URETEROSCOPY/HOLMIUM LASER/STENT EXCHANGE;  Surgeon: Ceasar Mons, MD;  Location: [facility];  Service: Urology;  Laterality: Right;   EMBOLECTOMY Left 02/24/2017   Procedure: Left Lower Extremity Embolectomy and Angiogram.;  Surgeon: Waynetta Sandy, MD;  Location: [place];  Service: Vascular;  Laterality: Left;   INSERTION OF ILIAC STENT  02/24/2017   Procedure: INSERTION OF Common ILIAC STENT;  Surgeon: Waynetta Sandy, MD;  Location: [place];  Service: Vascular;;   INTRAOPERATIVE ARTERIOGRAM Left 02/25/2018   Procedure: INTRA OPERATIVE ARTERIOGRAM WITH LEFT LEG RUNOFF;  Surgeon: Conrad Martinsburg, MD;  Location: [place];  Service: Vascular;  Laterality: Left;   LOWER EXTREMITY ANGIOGRAM Left 02/24/2017   Procedure: Aortagram, Left lower extremity Run-off;  Surgeon: Waynetta Sandy, MD;  Location: [place];  Service: Vascular;  Laterality: Left;   LOWER EXTREMITY ANGIOGRAM Left 12/28/2020   Procedure: AORTOGRAM, LEFT LOWER EXTREMITY ANGIOGRAM, THROMBOLYSIS VIA LEFT BRACHIAL ARTERY APPROACH;  Surgeon: Cherre Onell Mcmath, MD;  Location: [place];  Service: Vascular;  Laterality: Left;   LOWER EXTREMITY ANGIOGRAPHY N/A 12/29/2020   Procedure: LYSIS RECHECK;  Surgeon: Waynetta Sandy, MD;  Location: [place] [facility];  Service: Cardiovascular;  Laterality: N/A;   LOWER EXTREMITY ANGIOGRAPHY Left 09/17/2021   Procedure: Lower Extremity Angiography;  Surgeon: Marty Heck, MD;  Location: [place] [facility];  Service: Cardiovascular;  Laterality: Left;   PATCH ANGIOPLASTY Left 02/25/2018   Procedure: PATCH ANGIOPLASTY LEFT SUPERFICIAL FEMORAL ARTERY WITH BOVINE PATCH;  Surgeon: Conrad Whitewater, MD;  Location: [place];  Service: Vascular;  Laterality: Left;   PERCUTANEOUS VENOUS THROMBECTOMY,LYSIS WITH INTRAVASCULAR ULTRASOUND (IVUS) Left 05/12/2018   Procedure:  MECHANICAL THROMBECTOMY LEFT LEG, BALLOON ANGIOPLASTY LEFT ANTERIOR TIBIAL ARTERY, AORTOGRAM WITH LEFT LEG RUNOFF;  Surgeon: Serafina Mitchell, MD;  Location: [place];  Service: Vascular;  Laterality: Left;   PERIPHERAL VASCULAR INTERVENTION  09/18/2021   Procedure: PERIPHERAL VASCULAR INTERVENTION;  Surgeon: Cherre Shalee Paolo, MD;  Location: [place] [facility];  Service: Cardiovascular;;   PERIPHERAL VASCULAR THROMBECTOMY  12/29/2020   Procedure: PERIPHERAL VASCULAR THROMBECTOMY;  Surgeon: Waynetta Sandy, MD;  Location: [place] [facility];  Service: Cardiovascular;;   PERIPHERAL VASCULAR THROMBECTOMY Left 09/17/2021   Procedure: PERIPHERAL VASCULAR THROMBECTOMY;  Surgeon: Marty Heck, MD;  Location: [place] [facility];  Service: Cardiovascular;  Laterality: Left;  lysis   PERIPHERAL VASCULAR THROMBECTOMY N/A 09/18/2021   Procedure: LYSIS RECHECK;  Surgeon: Cherre Moya Duan, MD;  Location: [place] [facility];  Service: Cardiovascular;  Laterality: N/A;   removal of parathyroid adenoma  5/11   THROMBECTOMY FEMORAL ARTERY Left 02/25/2018   Procedure: LEFT ILIAC AND POPLITEAL ARTERY THROMBECTOMY;  Surgeon: Conrad Kaycee, MD;  Location: [place];  Service: Vascular;  Laterality: Left;   TUBAL LIGATION     WOUND DEBRIDEMENT Left 03/03/2018   Procedure: DEBRIDEMENT WOUND;  Surgeon: Angelia Mould, MD;  Location: [place];  Service: Vascular;  Laterality: Left;   WOUND EXPLORATION Left 03/03/2018   Procedure: WOUND EXPLORATION;  Surgeon: Angelia Mould, MD;  Location: [facility]  OR;  Service: Vascular;  Laterality: Left;    Allergies  Allergen Reactions   Ciprofloxacin Hcl Hives   Dilaudid [Hydromorphone Hcl] Shortness Of Breath and Other (See Comments)    "Asystole," per pt report   Macrobid [Nitrofurantoin Macrocrystal] Hives, Shortness Of Breath and Rash   Other Hives, Shortness Of Breath and Rash    NO "-CILLINS"!!!   Penicillins Shortness Of Breath    Had had cephalosporins  without incident Has patient had a PCN reaction causing immediate rash, facial/tongue/throat swelling, SOB or lightheadedness with hypotension: Yes Has patient had a PCN reaction causing severe rash involving mucus membranes or skin necrosis: Unk Has patient had a PCN reaction that required hospitalization: Unk Has patient had a PCN reaction occurring within the last 10 years: No If all of the above answers are "NO", then may proceed with Cephalosporin use.    Sulfa Antibiotics Hives, Shortness Of Breath and Rash   Lexapro [Escitalopram Oxalate] Other (See Comments)    "I just did not like it."    Prior to Admission medications   Medication Sig Start Date End Date Taking? Authorizing Provider  amLODipine (NORVASC) 5 MG tablet Take 1 tablet (5 mg total) by mouth daily. 01/08/21  Yes Elsie Stain, MD  aspirin 81 MG EC tablet TAKE 1 TABLET (81 MG TOTAL) BY MOUTH DAILY. SWALLOW WHOLE. 09/25/21 09/25/22 Yes Baglia, Corrina, PA-C  atorvastatin (LIPITOR) 40 MG tablet TAKE 1 TABLET (40 MG TOTAL) BY MOUTH DAILY. 07/22/21 07/22/22 Yes Ladell Pier, MD  buPROPion (WELLBUTRIN SR) 150 MG 12 hr tablet TAKE 1 TABLET (150 MG TOTAL) BY MOUTH 2 (TWO) TIMES DAILY. 08/20/21  Yes Nwoko, Uchenna E, PA  carvedilol (COREG) 3.125 MG tablet TAKE ONE TABLET BY MOUTH TWICE DAILY Patient taking differently: Take 3.125 mg by mouth in the morning and at bedtime. 06/10/21  Yes Ladell Pier, MD  Accu-Chek Softclix Lancets lancets USE AS INSTRUCTED 3 TIMES A DAY 10/01/21   Ladell Pier, MD  clonazePAM (KLONOPIN) 1 MG tablet Take 1 tablet (1 mg total) by mouth 3 (three) times daily as needed for anxiety. 08/20/21   Nwoko, Terese Door, PA  clopidogrel (PLAVIX) 75 MG tablet Take 1 tablet (75 mg total) by mouth daily. 09/26/21   Karoline Caldwell, PA-C  Continuous Blood Gluc Receiver (FREESTYLE LIBRE 2 READER) DEVI Check blood sugar TID. 07/23/21   Ladell Pier, MD  gabapentin (NEURONTIN) 400 MG capsule TAKE  1 CAPSULE (400 MG TOTAL) BY MOUTH 3 (THREE) TIMES DAILY. 10/01/21   Ladell Pier, MD  glucose blood (ACCU-CHEK GUIDE) test strip USE AS INSTRUCTED 3 TIMES A DAY 10/01/21   Ladell Pier, MD  hydrOXYzine (ATARAX) 25 MG tablet Take 2 tablets (50 mg total) by mouth 3 (three) times daily as needed for anxiety. 09/25/21   Baglia, Corrina, PA-C  insulin glargine (LANTUS SOLOSTAR) 100 UNIT/ML Solostar Pen Inject 65 Units into the skin daily. Patient taking differently: Inject 60 Units into the skin daily. 07/21/21   Ladell Pier, MD  nicotine (NICODERM CQ - DOSED IN MG/24 HOURS) 21 mg/24hr patch Place 1 patch (21 mg total) onto the skin daily. 09/25/21 09/25/22  Baglia, Corrina, PA-C  nitroGLYCERIN (NITROSTAT) 0.4 MG SL tablet PLACE 1 TABLET (0.4 MG TOTAL) UNDER THE TONGUE EVERY 5 (FIVE) MINUTES AS NEEDED FOR CHEST PAIN. Patient taking differently: Place 0.4 mg under the tongue every 5 (five) minutes as needed for chest pain. 05/22/20 11/14/21  Elouise Munroe, MD  NOVOLOG FLEXPEN 100 UNIT/ML FlexPen Use as directed per sliding scale, Pt is instructed about direction, Maximum 45 units per day  CBG 70 - 120: 0 units CBG 121 - 150: 2 units CBG 151 - 200: 3 units CBG 201 - 250: 5 units CBG 251 - 300: 8 units CBG 301 - 350: 11 units CBG 351 - 400: 15 units CBG > 400: Call Primary provider Patient taking differently: Inject 2-15 Units into the skin 3 (three) times daily with meals. Use as directed per sliding scale, Pt is instructed about direction, Maximum 45 units per day  CBG 70 - 120: 0 units CBG 121 - 150: 2 units CBG 151 - 200: 3 units CBG 201 - 250: 5 units CBG 251 - 300: 8 units CBG 301 - 350: 11 units CBG 351 - 400: 15 units CBG > 400: Call Primary provider 08/04/21   Ladell Pier, MD  olopatadine (PATANOL) 0.1 % ophthalmic solution Place 1 drop into both eyes 2 (two) times daily. Patient taking differently: Place 1 drop into both eyes 2 (two) times daily as needed for allergies.  02/17/21   Ladell Pier, MD  ondansetron (ZOFRAN) 4 MG tablet Take 1 tablet (4 mg total) by mouth every 8 (eight) hours as needed for nausea or vomiting. 09/25/21   Baglia, Corrina, PA-C  oxyCODONE-acetaminophen (PERCOCET/ROXICET) 5-325 MG tablet Take 1 tablet by mouth every 6 (six) hours as needed for moderate pain. 09/25/21   Baglia, Corrina, PA-C  sertraline (ZOLOFT) 100 MG tablet TAKE 1 AND 1/2 TABLET (150 MG) BY MOUTH DAILY. Patient taking differently: Take 150 mg by mouth daily. 09/11/21   Malachy Mood, PA  traZODone (DESYREL) 100 MG tablet TAKE 1 TABLET (100 MG TOTAL) BY MOUTH AT BEDTIME. 09/11/21   Nwoko, Terese Door, PA  TRUEPLUS INSULIN SYRINGE 31G X 5/16" 1 ML MISC USE AS DIRECTED 05/21/20   Ladell Pier, MD  VENTOLIN HFA 108 (90 Base) MCG/ACT inhaler INHALE TWO PUFFS BY MOUTH EVERY SIX HOURS AS NEEDED FOR WHEEZING OR SHORTNESS OF BREATH 10/01/21   Ladell Pier, MD  warfarin (COUMADIN) 5 MG tablet TAKE ONE TABLET ( 5 MG) BY MOUTH AT NOON ON SUN/TUES/THURS/SAT AND 2 TABLETS (10 MG) ON MON/WED/FRI 09/29/21   Ladell Pier, MD    Social History   Socioeconomic History   Marital status: Single    Spouse name: Not on file   Number of children: Not on file   Years of education: Not on file   Highest education level: Not on file  Occupational History   Not on file  Tobacco Use   Smoking status: Some Days    Packs/day: 0.50    Years: 15.00    Pack years: 7.50    Types: Cigarettes   Smokeless tobacco: Never  Vaping Use   Vaping Use: Never used  Substance and Sexual Activity   Alcohol use: Yes    Comment: occassionally   Drug use: Yes    Types: Marijuana, Cocaine    Comment: once daily- last time used was night of 02/28/2020   Sexual activity: Yes    Partners: Male    Birth control/protection: Other-see comments    Comment: BTL  Other Topics Concern   Not on file  Social History Narrative   Not on file   Social Determinants of Health   Financial  Resource Strain: Not on file  Food Insecurity: Not on file  Transportation Needs: Not on file  Physical Activity:  Not on file  Stress: Not on file  Social Connections: Not on file  Intimate Partner Violence: Not on file     Family History  Problem Relation Age of Onset   Depression Mother    Diabetes Father    Heart failure Father     ROS: Otherwise negative unless mentioned in HPI  Physical Examination  Vitals:   10/11/21 1230 10/11/21 1245  BP: 138/76 (!) 113/52  Pulse: 87 86  Resp: (!) 23 (!) 22  Temp:    SpO2: 98% 94%   Body mass index is 39.16 kg/m.  General:  WDWN in NAD Gait: Not observed HENT: WNL, normocephalic Pulmonary: normal non-labored breathing, without Rales, rhonchi,  wheezing Cardiac: regular,  Abdomen:  soft, NT/ND, no masses Skin: without rashes Vascular Exam/Pulses: Multiphasic dorsalis pedis and posterior tibial signals in the left foot Extremities: with ischemic changes, without Gangrene , with cellulitis; with open wounds;        Musculoskeletal: no muscle wasting or atrophy  Neurologic: A&O X 3;  No focal weakness or paresthesias are detected; speech is fluent/normal Psychiatric:  The pt appeared sedated Lymph:  Unremarkable  CBC    Component Value Date/Time   WBC 12.4 (H) 10/11/2021 0235   RBC 3.78 (L) 10/11/2021 0235   HGB 11.1 (L) 10/11/2021 0235   HGB 11.8 07/21/2021 1515   HCT 36.2 10/11/2021 0235   HCT 36.8 07/21/2021 1515   PLT 499 (H) 10/11/2021 0235   PLT 439 07/21/2021 1515   MCV 95.8 10/11/2021 0235   MCV 80 07/21/2021 1515   MCH 29.4 10/11/2021 0235   MCHC 30.7 10/11/2021 0235   RDW 25.8 (H) 10/11/2021 0235   RDW 16.2 (H) 07/21/2021 1515   LYMPHSABS 3.6 10/11/2021 0235   LYMPHSABS 3.1 01/21/2020 1603   MONOABS 0.6 10/11/2021 0235   EOSABS 0.5 10/11/2021 0235   EOSABS 0.4 01/21/2020 1603   BASOSABS 0.0 10/11/2021 0235   BASOSABS 0.1 01/21/2020 1603    BMET    Component Value Date/Time   NA 135  10/11/2021 0235   NA 138 07/21/2021 1515   K 4.6 10/11/2021 0235   CL 102 10/11/2021 0235   CO2 25 10/11/2021 0235   GLUCOSE 235 (H) 10/11/2021 0235   BUN 13 10/11/2021 0235   BUN 13 07/21/2021 1515   CREATININE 1.59 (H) 10/11/2021 0235   CREATININE 1.88 (H) 02/19/2020 1555   CALCIUM 10.0 10/11/2021 0235   GFRNONAA 40 (L) 10/11/2021 0235   GFRNONAA 32 (L) 02/19/2020 1555   GFRAA 30 (L) 07/29/2020 1528   GFRAA 37 (L) 02/19/2020 1555    COAGS: Lab Results  Component Value Date   INR 2.2 (H) 10/11/2021   INR 2.2 (H) 09/26/2021   INR 2.7 (H) 09/25/2021     Non-Invasive Vascular Imaging:   none   ASSESSMENT/PLAN: This is a 46 y.o. female who presents with a several day history of left foot wounds from trauma to the foot.  The wounds are present at the first second and third digits.  There is surrounding cellulitis.  Zurisadai is aware that she is maximally revascularized, however this may not be enough to heal her foot wounds.  She would benefit from aggressive wound care, and foot offloading in an effort to heal the areas.  The wounds appear superficial at this point.  I am worried, that should this progress, Dejon would not heal a toe amputation due to her poor blood flow, and would likely require a higher level of  amputation.  Recommend continuing current therapy No compression to the left leg, no weightbearing in the forefoot, recommend keeping toes dry, broad-spectrum antibiotics I will continue to follow   Cassandria Santee MD [facility] [phone] 10/11/2021  1:51 PM
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
## 2021-10-11 NOTE — ED Triage Notes (Signed)
Pt brought to ED by [facility] via wheelchair to ED triage with c/o necrosis to left big toe, has hx of vascular problems in affected extremity but states this has become acute over the past two weeks. Pt has hx of diabetes and HTN. Also expresses concerns for renal failure.

## 2021-10-11 NOTE — ED Notes (Signed)
Pt requesting home meds zoloft, klonopin, warfarin, gabapentin, and increased dose of percocet. Secure chat sent to MD.

## 2021-10-11 NOTE — Progress Notes (Signed)
ANTICOAGULATION CONSULT NOTE - Initial Consult  Pharmacy Consult for heparin Indication: hx of VTE, limb ischemia  Allergies  Allergen Reactions   Ciprofloxacin Hcl Hives   Dilaudid [Hydromorphone Hcl] Shortness Of Breath and Other (See Comments)    "Asystole," per pt report   Macrobid [Nitrofurantoin Macrocrystal] Hives, Shortness Of Breath and Rash   Other Hives, Shortness Of Breath and Rash    NO "-CILLINS"!!!   Penicillins Shortness Of Breath    Had had cephalosporins without incident Has patient had a PCN reaction causing immediate rash, facial/tongue/throat swelling, SOB or lightheadedness with hypotension: Yes Has patient had a PCN reaction causing severe rash involving mucus membranes or skin necrosis: Unk Has patient had a PCN reaction that required hospitalization: Unk Has patient had a PCN reaction occurring within the last 10 years: No If all of the above answers are "NO", then may proceed with Cephalosporin use.    Sulfa Antibiotics Hives, Shortness Of Breath and Rash   Lexapro [Escitalopram Oxalate] Other (See Comments)    "I just did not like it."    Patient Measurements: Weight: 113.4 kg (250 lb) Heparin Dosing Weight: 88kg  Vital Signs: Temp: 98.2 F (36.8 C) (01/08 0740) BP: 113/52 (01/08 1245) Pulse Rate: 96 (01/08 1600)  Labs: Recent Labs    10/11/21 0235 10/11/21 0509  HGB 11.1*  --   HCT 36.2  --   PLT 499*  --   LABPROT 24.2*  --   INR 2.2*  --   CREATININE 1.59*  --   TROPONINIHS 5 5    Estimated Creatinine Clearance: 57.4 mL/min (A) (by C-G formula based on SCr of 1.59 mg/dL (H)).   Medical History: Past Medical History:  Diagnosis Date   Anxiety    Depression    Diabetes ([place])    Type 2   GERD (gastroesophageal reflux disease)    History of blood clots    ,DVT-left leg, and early 2000, had blood clot behind left breast   History of kidney stones    Hypercalcemia    Hyperparathyroidism    Hypertension    had been on  Lisinopril and PCP took her off it 4 months ago   Iron deficiency anemia    Left thyroid nodule    Mass of right ovary    Nephrolithiasis    PAOD (peripheral arterial occlusive disease) (HCC)    Peripheral vascular disease ([facility])    Prolonged Q-T interval on ECG 04/19/2019   Renal calculi    Substance abuse ([place])    Tooth loose    top tooth from the right  is loose     Assessment: 59 YOF presenting with worsening foot wound, 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  INR therapeutic on admission at 2.2  Goal of Therapy:  Heparin level 0.3-0.7 units/ml Monitor platelets by anticoagulation protocol: Yes   Plan:  Start heparin gtt when INR <2 Daily INR, s/s bleeding F/u VVS plans  Bertis Ruddy, PharmD Clinical Pharmacist ED Pharmacist Phone # [phone] 10/11/2021 4:23 PM

## 2021-10-11 NOTE — ED Notes (Signed)
Quita Skye, son, number is [phone]. Call for any updates

## 2021-10-11 NOTE — ED Provider Triage Note (Signed)
Emergency Medicine Provider Triage Evaluation Note  Terri Sparks , a 46 y.o. female  was evaluated in triage.  Pt complains of left foot pain and swelling.  Patient reports an infection to the left great toe.  This has been worsening with surrounding pain and swelling.  Also complains of chest pain.  No shortness of breath.  No nausea or vomiting.  Lastly, patient notes hematuria for the past week.  Physical Exam  BP 122/70 (BP Location: Right Arm)    Pulse 99    Temp 98.2 F (36.8 C) (Oral)    Resp 19    SpO2 99%  Gen:   Awake, no distress   Resp:  Normal effort  MSK:   Moves extremities without difficulty  Other:  Necrosis noted to the distal end of the left great toe.  Moderate pedal edema.  Dopplerable DP and PT pulses.  Medical Decision Making  Medically screening exam initiated at 2:35 AM.  Appropriate orders placed.  Terri Sparks was informed that the remainder of the evaluation will be completed by another provider, this initial triage assessment does not replace that evaluation, and the importance of remaining in the ED until their evaluation is complete.   Rayna Sexton, PA-C 10/11/21 0236

## 2021-10-11 NOTE — ED Notes (Signed)
Lunch tray delivered to pt at this time.

## 2021-10-11 NOTE — Consult Note (Signed)
[employer] Nurse Consult Note: Reason for Consult: [facility] Nurse is simultaneously consulted with Vascular Surgery, Vascular is familiar with this patient, has seen and will follow. Dr. Virl Cagey has placed photos of the patient's wounds in his note and given care recommendations in his note. Wound type: trauma Pressure Injury POA: N/A Dressing procedure/placement/frequency: I have provided Dr. Virl Cagey care orders to Nursing via the Orders.  Twice daily cleanse, dry, place dry gauze over and between digits, secure.  Patient is to be non weightbearing to forefoot. No compression.  I have communicated this to Dr. Tamala Julian via [place].  [facility] nursing team will not follow, but will remain available to this patient, the nursing and medical teams.  Please re-consult if needed. Thanks, Maudie Flakes, MSN, RN, [place], Arther Abbott  Pager# [phone]

## 2021-10-11 NOTE — ED Provider Notes (Signed)
[facility] EMERGENCY DEPARTMENT Provider Note   CSN: [id number] Arrival date & time: 10/11/21  0210     History  Chief Complaint  Patient presents with   Foot Injury    Terri Sparks is a 46 y.o. female w/ hx of left CIA stent, profunda thrombectomy (Dec 2022), popliteal thrombectomy and stenting, peroneal stenting (Dec 2022), on coumadin, presenting to ED with pain in her left foot.  She reports redness and swelling around her left eye beginning 5 days ago, but blackness and discoloration of the left eye beginning 3 days ago on Friday.  She denies fevers or chills.  She reports she is having significant pain in her foot.  She denies pain elsewhere in her leg.  She reports compliance with her Coumadin.   Per Operative report on 09/28/21 (Dr Roselie Awkward) for critical ischemia of left lower limb  PROCEDURE:   1) Left lower extremity angiogram with third order cannulation (158mL total contrast) 2) Conscious sedation (95 minutes) 3) Left common iliac artery stenting (6x39mm VBX) 4) Left profunda femoris mechanical thrombectomy (Penumbra) 5) Left popliteal mechanical thrombectomy (Penumbra) 6) Left peroneal mechanical thrombectomy (Penumbra) 7) Left popliteal angioplasty and stenting (5x163mm viabahn, post dilated to 73mm) 8) Left peroneal angioplasty (3x218mm [place])  HPI     Home Medications Prior to Admission medications   Medication Sig Start Date End Date Taking? Authorizing Provider  Accu-Chek Softclix Lancets lancets USE AS INSTRUCTED 3 TIMES A DAY 10/01/21   Ladell Pier, MD  amLODipine (NORVASC) 5 MG tablet Take 1 tablet (5 mg total) by mouth daily. 01/08/21   Elsie Stain, MD  aspirin 81 MG EC tablet TAKE 1 TABLET (81 MG TOTAL) BY MOUTH DAILY. SWALLOW WHOLE. 09/25/21 09/25/22  Baglia, Corrina, PA-C  atorvastatin (LIPITOR) 40 MG tablet TAKE 1 TABLET (40 MG TOTAL) BY MOUTH DAILY. 07/22/21 07/22/22  Ladell Pier, MD  buPROPion (WELLBUTRIN SR)  150 MG 12 hr tablet TAKE 1 TABLET (150 MG TOTAL) BY MOUTH 2 (TWO) TIMES DAILY. 08/20/21   Nwoko, Terese Door, PA  carvedilol (COREG) 3.125 MG tablet TAKE ONE TABLET BY MOUTH TWICE DAILY Patient taking differently: Take 3.125 mg by mouth in the morning and at bedtime. 06/10/21   Ladell Pier, MD  clonazePAM (KLONOPIN) 1 MG tablet Take 1 tablet (1 mg total) by mouth 3 (three) times daily as needed for anxiety. 08/20/21   Nwoko, Terese Door, PA  clopidogrel (PLAVIX) 75 MG tablet Take 1 tablet (75 mg total) by mouth daily. 09/26/21   Karoline Caldwell, PA-C  Continuous Blood Gluc Receiver (FREESTYLE LIBRE 2 READER) DEVI Check blood sugar TID. 07/23/21   Ladell Pier, MD  gabapentin (NEURONTIN) 400 MG capsule TAKE 1 CAPSULE (400 MG TOTAL) BY MOUTH 3 (THREE) TIMES DAILY. 10/01/21   Ladell Pier, MD  glucose blood (ACCU-CHEK GUIDE) test strip USE AS INSTRUCTED 3 TIMES A DAY 10/01/21   Ladell Pier, MD  hydrOXYzine (ATARAX) 25 MG tablet Take 2 tablets (50 mg total) by mouth 3 (three) times daily as needed for anxiety. 09/25/21   Baglia, Corrina, PA-C  insulin glargine (LANTUS SOLOSTAR) 100 UNIT/ML Solostar Pen Inject 65 Units into the skin daily. Patient taking differently: Inject 60 Units into the skin daily. 07/21/21   Ladell Pier, MD  nicotine (NICODERM CQ - DOSED IN MG/24 HOURS) 21 mg/24hr patch Place 1 patch (21 mg total) onto the skin daily. 09/25/21 09/25/22  Baglia, Corrina, PA-C  nitroGLYCERIN (NITROSTAT) 0.4 MG  SL tablet PLACE 1 TABLET (0.4 MG TOTAL) UNDER THE TONGUE EVERY 5 (FIVE) MINUTES AS NEEDED FOR CHEST PAIN. Patient taking differently: Place 0.4 mg under the tongue every 5 (five) minutes as needed for chest pain. 05/22/20 11/14/21  Elouise Munroe, MD  NOVOLOG FLEXPEN 100 UNIT/ML FlexPen Use as directed per sliding scale, Pt is instructed about direction, Maximum 45 units per day  CBG 70 - 120: 0 units CBG 121 - 150: 2 units CBG 151 - 200: 3 units CBG 201 - 250: 5 units  CBG 251 - 300: 8 units CBG 301 - 350: 11 units CBG 351 - 400: 15 units CBG > 400: Call Primary provider Patient taking differently: Inject 2-15 Units into the skin 3 (three) times daily with meals. Use as directed per sliding scale, Pt is instructed about direction, Maximum 45 units per day  CBG 70 - 120: 0 units CBG 121 - 150: 2 units CBG 151 - 200: 3 units CBG 201 - 250: 5 units CBG 251 - 300: 8 units CBG 301 - 350: 11 units CBG 351 - 400: 15 units CBG > 400: Call Primary provider 08/04/21   Ladell Pier, MD  olopatadine (PATANOL) 0.1 % ophthalmic solution Place 1 drop into both eyes 2 (two) times daily. Patient taking differently: Place 1 drop into both eyes 2 (two) times daily as needed for allergies. 02/17/21   Ladell Pier, MD  ondansetron (ZOFRAN) 4 MG tablet Take 1 tablet (4 mg total) by mouth every 8 (eight) hours as needed for nausea or vomiting. 09/25/21   Baglia, Corrina, PA-C  oxyCODONE-acetaminophen (PERCOCET/ROXICET) 5-325 MG tablet Take 1 tablet by mouth every 6 (six) hours as needed for moderate pain. 09/25/21   Baglia, Corrina, PA-C  sertraline (ZOLOFT) 100 MG tablet TAKE 1 AND 1/2 TABLET (150 MG) BY MOUTH DAILY. Patient taking differently: Take 150 mg by mouth daily. 09/11/21   Malachy Mood, PA  traZODone (DESYREL) 100 MG tablet TAKE 1 TABLET (100 MG TOTAL) BY MOUTH AT BEDTIME. 09/11/21   Nwoko, Terese Door, PA  TRUEPLUS INSULIN SYRINGE 31G X 5/16" 1 ML MISC USE AS DIRECTED 05/21/20   Ladell Pier, MD  VENTOLIN HFA 108 (90 Base) MCG/ACT inhaler INHALE TWO PUFFS BY MOUTH EVERY SIX HOURS AS NEEDED FOR WHEEZING OR SHORTNESS OF BREATH 10/01/21   Ladell Pier, MD  warfarin (COUMADIN) 5 MG tablet TAKE ONE TABLET ( 5 MG) BY MOUTH AT NOON ON SUN/TUES/THURS/SAT AND 2 TABLETS (10 MG) ON MON/WED/FRI 09/29/21   Ladell Pier, MD      Allergies    Ciprofloxacin hcl, Dilaudid [hydromorphone hcl], Macrobid [nitrofurantoin macrocrystal], Other, Penicillins, Sulfa  antibiotics, and Lexapro [escitalopram oxalate]    Review of Systems   Review of Systems  Physical Exam Updated Vital Signs BP (!) 107/53    Pulse 91    Temp 98.2 F (36.8 C)    Resp 20    Wt 113.4 kg    SpO2 98%    BMI 39.16 kg/m  Physical Exam Constitutional:      Comments: Falling asleep on exam, in no distress  HENT:     Head: Normocephalic and atraumatic.  Eyes:     Conjunctiva/sclera: Conjunctivae normal.     Pupils: Pupils are equal, round, and reactive to light.  Cardiovascular:     Rate and Rhythm: Normal rate and regular rhythm.     Comments: No palpable pedal pulse left foot Pulmonary:     Effort:  Pulmonary effort is normal. No respiratory distress.  Musculoskeletal:     Comments: Discoloration of left toe and 2nd, 3rd toe, please see photos.  Left foot is warm, erythematous, left toe is tender to the touch.  Skin:    General: Skin is warm and dry.  Neurological:     General: No focal deficit present.     Mental Status: She is alert and oriented to person, place, and time. Mental status is at baseline.            ED Results / Procedures / Treatments   Labs (all labs ordered are listed, but only abnormal results are displayed) Labs Reviewed  COMPREHENSIVE METABOLIC PANEL - Abnormal; Notable for the following components:      Result Value   Glucose, Bld 235 (*)    Creatinine, Ser 1.59 (*)    Albumin 3.1 (*)    AST 13 (*)    GFR, Estimated 40 (*)    All other components within normal limits  CBC WITH DIFFERENTIAL/PLATELET - Abnormal; Notable for the following components:   WBC 12.4 (*)    RBC 3.78 (*)    Hemoglobin 11.1 (*)    RDW 25.8 (*)    Platelets 499 (*)    All other components within normal limits  URINALYSIS, ROUTINE W REFLEX MICROSCOPIC - Abnormal; Notable for the following components:   Color, Urine RED (*)    APPearance CLOUDY (*)    Glucose, UA 100 (*)    Hgb urine dipstick LARGE (*)    Bilirubin Urine SMALL (*)    Protein, ur 100  (*)    Nitrite POSITIVE (*)    Leukocytes,Ua LARGE (*)    All other components within normal limits  SEDIMENTATION RATE - Abnormal; Notable for the following components:   Sed Rate 53 (*)    All other components within normal limits  C-REACTIVE PROTEIN - Abnormal; Notable for the following components:   CRP 6.8 (*)    All other components within normal limits  PROTIME-INR - Abnormal; Notable for the following components:   Prothrombin Time 24.2 (*)    INR 2.2 (*)    All other components within normal limits  URINALYSIS, MICROSCOPIC (REFLEX) - Abnormal; Notable for the following components:   Bacteria, UA MANY (*)    All other components within normal limits  CBG MONITORING, ED - Abnormal; Notable for the following components:   Glucose-Capillary 253 (*)    All other components within normal limits  CULTURE, BLOOD (ROUTINE X 2)  CULTURE, BLOOD (ROUTINE X 2)  URINE CULTURE  RESP PANEL BY RT-PCR (FLU A&B, COVID) ARPGX2  PREGNANCY, URINE  LACTIC ACID, PLASMA  RAPID URINE DRUG SCREEN, HOSP PERFORMED  I-STAT BETA HCG BLOOD, ED ([facility], [facility], [facility] ONLY)  TROPONIN I (HIGH SENSITIVITY)  TROPONIN I (HIGH SENSITIVITY)    EKG None  Radiology DG Chest 2 View  Result Date: 10/11/2021 CLINICAL DATA:  Chest pain EXAM: CHEST - 2 VIEW COMPARISON:  09/17/2021 FINDINGS: The heart size and mediastinal contours are within normal limits. Both lungs are clear. The visualized skeletal structures are unremarkable. IMPRESSION: Negative. Electronically Signed   By: Rolm Baptise M.D.   On: 10/11/2021 03:14   DG Foot Complete Left  Result Date: 10/11/2021 CLINICAL DATA:  Left great toe infection.  Cellulitis. EXAM: LEFT FOOT - COMPLETE 3+ VIEW COMPARISON:  None. FINDINGS: No acute bony abnormality. Specifically, no fracture, subluxation, or dislocation. Joint spaces maintained. No bone destruction to suggest osteomyelitis.  No soft tissue gas. IMPRESSION: No acute bony abnormality. Electronically Signed   By: Rolm Baptise M.D.   On: 10/11/2021 03:15    Procedures Procedures    Medications Ordered in ED Medications  cefTRIAXone (ROCEPHIN) 2 g in sodium chloride 0.9 % 100 mL IVPB (2 g Intravenous New Bag/Given 10/11/21 0924)  metroNIDAZOLE (FLAGYL) IVPB 500 mg (500 mg Intravenous New Bag/Given 10/11/21 0920)  vancomycin (VANCOCIN) 2,250 mg in sodium chloride 0.9 % 500 mL IVPB (has no administration in time range)  vancomycin (VANCOCIN) IVPB 1000 mg/200 mL premix (has no administration in time range)    ED Course/ Medical Decision Making/ A&P Clinical Course as of 10/11/21 0949  Sun Oct 11, 2021  0904 Page placed to vascular surgery through office [MT]  [phone] The patient's left foot overall is warm, appears well-perfused.  She does not have significant pain that would be consistent with ischemia; and although I cannot hear an audible Doppler pulse, I do think the foot is perfused.  She does have a popliteal pulse by doppler.  This presentation is more consistent with an infection, possible micro emboli to the toes, awaiting callback from vascular surgeon [MT]  [phone] I spoke to Dr Unk Lightning who will evaluate the patient, is familiar with her history, and feels further revascularization options are extremely limited.  We've agreed to hospitalization for IV antibiotics for cellulitis and UTI and they will consult on patient. [MT]  [phone] Attempted to contact son Krishna Heuer to provide update per his earlier request with staff but no response by phone [MT]  [phone] Admitted to hospitalist.  Given INR 2.2, holding off on heparin at this time. [MT]    Clinical Course User Index [MT] Yanitza Shvartsman, Carola Rhine, MD                           Medical Decision Making  This patient presents to the ED with concern for left foot discoloration and warmth. This involves an extensive number of treatment options, and is a complaint that carries with it a high risk of complications and morbidity.  The differential diagnosis includes  infection versus embolic ischemia versus other  Co-morbidities that complicate the patient evaluation: History of acute ischemia 1 month ago, patient on Coumadin, also history of diabetes, which raises risk of infection and ischemia  External records from outside source obtained and reviewed including previous hospitalization operative records from December regarding vascular procedure  I ordered and personally interpreted labs.  The pertinent results include:  INR 2.2, WBC 12.4, hgb 11.1., glucose 235, Sed Rate 53, UA + Leuks and nitrites  I ordered imaging studies including dg foot, chest  I independently visualized and interpreted imaging which showed soft tissue swelling of the foot but no evidence of bony destruction or osteo-myelitis, no focal infiltrate of the chest I agree with the radiologist interpretation  The patient was maintained on a cardiac monitor.  I personally viewed and interpreted the cardiac monitored which showed an underlying rhythm of: Sinus rhythm  I ordered medication including IV antibiotics for suspected skin and soft tissue infection, possible underlying osteomyelitis that is not clearly visible on initial x-ray.  IV antibiotics also as cross coverage for suspected UTI based on urinalysis.  Test Considered:  No immediate recommendation for CT angiogram at this time by the vascular surgeon, who prefers to evaluate the patient at bedside.  There may be limited intervention options, he will discuss with the patient  and provide recommendations.  No immediate indication for heparin as the patient's INR appears to be therapeutic at 3.2   I requested consultation with the vascular surgeon,  and discussed lab and imaging findings as well as pertinent plan - they recommend: See ED course below  Problem List / ED Course: Cellulitis of the right foot, with elevated sed rate, broad-spectrum IV antibiotics ordered & blood cx for bacteremia rule out UTI -Rocephin should  provide cross coverage  After the interventions noted above, I reevaluated the patient and found that they have: stayed the same  Dispostion:  After consideration of the diagnostic results and the patients response to treatment, I feel that the patent would benefit from hospitalization for IV antibiotics and vascular surgery consultation  Clinical Course as of 10/11/21 0949  Sun Oct 11, 2021  0904 Page placed to vascular surgery through office [MT]  [phone] The patient's left foot overall is warm, appears well-perfused.  She does not have significant pain that would be consistent with ischemia; and although I cannot hear an audible Doppler pulse, I do think the foot is perfused.  She does have a popliteal pulse by doppler.  This presentation is more consistent with an infection, possible micro emboli to the toes, awaiting callback from vascular surgeon [MT]  [phone] I spoke to Dr Unk Lightning who will evaluate the patient, is familiar with her history, and feels further revascularization options are extremely limited.  We've agreed to hospitalization for IV antibiotics for cellulitis and UTI and they will consult on patient. [MT]  [phone] Attempted to contact son [place] Prout to provide update per his earlier request with staff but no response by phone [MT]  [phone] Admitted to hospitalist.  Given INR 2.2, holding off on heparin at this time. [MT]    Clinical Course User Index [MT] Margreat Widener, Carola Rhine, MD           Final Clinical Impression(s) / ED Diagnoses Final diagnoses:  Cellulitis of right lower extremity  Urinary tract infection without hematuria, site unspecified    Rx / DC Orders ED Discharge Orders     None         Wyvonnia Dusky, MD 10/11/21 [phone]

## 2021-10-11 NOTE — Progress Notes (Signed)
Pharmacy Antibiotic Note  Terri Sparks is a 46 y.o. female admitted on 10/11/2021 with  diabetic foot infection .  Pharmacy has been consulted for vancomycin dosing. Also started on ceftriaxone and Flagyl per MD. SCr 1.59 - stable from previous. Patient weight is 250 lb per discussion with RN.  Plan: Ceftriaxone 2g IV q24h per MD Flagyl 500mg  IV q12h per MD Vancomycin 2250mg  IV x 1; then 1g IV q24h. Goal AUC 400-550. Expected AUC: 440 SCr used: 1.59 Monitor clinical progress, c/s, renal function F/u de-escalation plan/LOT, vancomycin levels as indicated     Temp (24hrs), Avg:98.2 F (36.8 C), Min:98.2 F (36.8 C), Max:98.2 F (36.8 C)  Recent Labs  Lab 10/11/21 0235  WBC 12.4*  CREATININE 1.59*    CrCl cannot be calculated (Unknown ideal weight.).    Allergies  Allergen Reactions   Ciprofloxacin Hcl Hives   Dilaudid [Hydromorphone Hcl] Shortness Of Breath and Other (See Comments)    "Asystole," per pt report   Macrobid [Nitrofurantoin Macrocrystal] Hives, Shortness Of Breath and Rash   Other Hives, Shortness Of Breath and Rash    NO "-CILLINS"!!!   Penicillins Shortness Of Breath    Had had cephalosporins without incident Has patient had a PCN reaction causing immediate rash, facial/tongue/throat swelling, SOB or lightheadedness with hypotension: Yes Has patient had a PCN reaction causing severe rash involving mucus membranes or skin necrosis: Unk Has patient had a PCN reaction that required hospitalization: Unk Has patient had a PCN reaction occurring within the last 10 years: No If all of the above answers are "NO", then may proceed with Cephalosporin use.    Sulfa Antibiotics Hives, Shortness Of Breath and Rash   Lexapro [Escitalopram Oxalate] Other (See Comments)    "I just did not like it."    Arturo Morton, PharmD, BCPS Please check AMION for all [place] contact numbers Clinical Pharmacist 10/11/2021 9:00 AM

## 2021-10-11 NOTE — ED Notes (Signed)
Report called to [place], [place]

## 2021-10-12 ENCOUNTER — Other Ambulatory Visit (HOSPITAL_COMMUNITY): Payer: Self-pay | Admitting: Physician Assistant

## 2021-10-12 DIAGNOSIS — L03116 Cellulitis of left lower limb: Secondary | ICD-10-CM | POA: Diagnosis not present

## 2021-10-12 DIAGNOSIS — E11628 Type 2 diabetes mellitus with other skin complications: Secondary | ICD-10-CM | POA: Diagnosis not present

## 2021-10-12 DIAGNOSIS — L089 Local infection of the skin and subcutaneous tissue, unspecified: Secondary | ICD-10-CM | POA: Diagnosis not present

## 2021-10-12 DIAGNOSIS — F411 Generalized anxiety disorder: Secondary | ICD-10-CM

## 2021-10-12 DIAGNOSIS — N390 Urinary tract infection, site not specified: Secondary | ICD-10-CM | POA: Diagnosis not present

## 2021-10-12 LAB — BASIC METABOLIC PANEL
Anion gap: 9 (ref 5–15)
BUN: 16 mg/dL (ref 6–20)
CO2: 24 mmol/L (ref 22–32)
Calcium: 9.5 mg/dL (ref 8.9–10.3)
Chloride: 100 mmol/L (ref 98–111)
Creatinine, Ser: 1.71 mg/dL — ABNORMAL HIGH (ref 0.44–1.00)
GFR, Estimated: 37 mL/min — ABNORMAL LOW (ref >60)
Glucose, Bld: 191 mg/dL — ABNORMAL HIGH (ref 70–99)
Potassium: 4.2 mmol/L (ref 3.5–5.1)
Sodium: 133 mmol/L — ABNORMAL LOW (ref 135–145)

## 2021-10-12 LAB — PROTIME-INR
INR: 1.9 — ABNORMAL HIGH (ref 0.8–1.2)
Prothrombin Time: 22.2 seconds — ABNORMAL HIGH (ref 11.4–15.2)

## 2021-10-12 LAB — CBC
HCT: 32.3 % — ABNORMAL LOW (ref 36.0–46.0)
Hemoglobin: 10.2 g/dL — ABNORMAL LOW (ref 12.0–15.0)
MCH: 29.7 pg (ref 26.0–34.0)
MCHC: 31.6 g/dL (ref 30.0–36.0)
MCV: 94.2 fL (ref 80.0–100.0)
Platelets: 402 10*3/uL — ABNORMAL HIGH (ref 150–400)
RBC: 3.43 MIL/uL — ABNORMAL LOW (ref 3.87–5.11)
RDW: 25 % — ABNORMAL HIGH (ref 11.5–15.5)
WBC: 10.2 10*3/uL (ref 4.0–10.5)
nRBC: 0 % (ref 0.0–0.2)

## 2021-10-12 LAB — GLUCOSE, CAPILLARY
Glucose-Capillary: 154 mg/dL — ABNORMAL HIGH (ref 70–99)
Glucose-Capillary: 156 mg/dL — ABNORMAL HIGH (ref 70–99)
Glucose-Capillary: 180 mg/dL — ABNORMAL HIGH (ref 70–99)
Glucose-Capillary: 280 mg/dL — ABNORMAL HIGH (ref 70–99)

## 2021-10-12 LAB — HEPARIN LEVEL (UNFRACTIONATED): Heparin Unfractionated: <0.1 IU/mL — ABNORMAL LOW (ref 0.30–0.70)

## 2021-10-12 MED ORDER — AMLODIPINE BESYLATE 5 MG PO TABS
5.0000 mg | ORAL_TABLET | Freq: Every morning | ORAL | Status: DC
  Administered 2021-10-12 – 2021-10-15 (×4): 5 mg via ORAL
  Filled 2021-10-12 (×4): qty 1

## 2021-10-12 MED ORDER — ONDANSETRON HCL 4 MG/2ML IJ SOLN
4.0000 mg | Freq: Four times a day (QID) | INTRAMUSCULAR | Status: DC | PRN
  Administered 2021-10-15: 4 mg via INTRAVENOUS
  Filled 2021-10-12 (×2): qty 2

## 2021-10-12 MED ORDER — CLONAZEPAM 0.5 MG PO TABS
1.0000 mg | ORAL_TABLET | Freq: Three times a day (TID) | ORAL | Status: DC | PRN
  Administered 2021-10-12 – 2021-10-15 (×7): 1 mg via ORAL
  Filled 2021-10-12 (×8): qty 2

## 2021-10-12 MED ORDER — OLOPATADINE HCL 0.1 % OP SOLN
1.0000 [drp] | Freq: Two times a day (BID) | OPHTHALMIC | Status: DC
  Administered 2021-10-12 – 2021-10-15 (×7): 1 [drp] via OPHTHALMIC
  Filled 2021-10-12 (×3): qty 5

## 2021-10-12 MED ORDER — HEPARIN (PORCINE) 25000 UT/250ML-% IV SOLN
2100.0000 [IU]/h | INTRAVENOUS | Status: DC
  Administered 2021-10-12: 1400 [IU]/h via INTRAVENOUS
  Administered 2021-10-13: 2000 [IU]/h via INTRAVENOUS
  Administered 2021-10-13: 1700 [IU]/h via INTRAVENOUS
  Administered 2021-10-14 – 2021-10-15 (×3): 2100 [IU]/h via INTRAVENOUS
  Filled 2021-10-12 (×6): qty 250

## 2021-10-12 MED ORDER — OXYCODONE-ACETAMINOPHEN 5-325 MG PO TABS
1.0000 | ORAL_TABLET | Freq: Four times a day (QID) | ORAL | Status: DC | PRN
  Administered 2021-10-12 – 2021-10-15 (×12): 1 via ORAL
  Filled 2021-10-12 (×12): qty 1

## 2021-10-12 MED ORDER — ALBUTEROL SULFATE HFA 108 (90 BASE) MCG/ACT IN AERS: 2.0000 | INHALATION_SPRAY | Freq: Four times a day (QID) | RESPIRATORY_TRACT | Status: DC | PRN

## 2021-10-12 NOTE — Plan of Care (Signed)

## 2021-10-12 NOTE — Progress Notes (Signed)
°  Daily Progress Note  Patient seen and examined this evening No changes from yesterday Terri Sparks is aware that she is maximally revascularized and that she needs diligent, aggressive wound care in an effort to save her foot.  She is aware that should this not heal, she will need high level of amputation. Recommend keeping the wound dry, with dry gauze.  No compression, no tape.  I will continue to follow peripherally.  Please call if questions or concerns arise.   Cassandria Santee MD MS [facility] [phone] 10/12/2021  7:14 PM

## 2021-10-12 NOTE — Progress Notes (Signed)
Progress Note    Terri Sparks   HDQ:[id number]  DOB: 09-16-1976  DOA: 10/11/2021     1 PCP: Ladell Pier, MD  Initial CC: Left flank pain, blood-tinged urine, left toe pain  Hospital Course: Terri Sparks is a 46 y.o. 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   Hospitalized from 09/17/2021-09/26/2021 after being found to have an acute ischemic left lower extremity for which she underwent left extremity arteriogram with initiation of thrombolysis.  TRH had been consulted as patient was incidentally noted to be COVID-19 positive, but was otherwise asymptomatic not requiring treatment.  During her hospitalization she reports that she had stubbed her toe on a IV pole, and it happened again when she got home bed.  She had been keeping the wound clean with antibacterial soap, peroxide, and Neosporin.  However, patient notes that her first second and third toe on the left started to turn more dark in appearance with redness on the dorsal aspect of the foot.    On admission she was also complaining of some blood tinged urine and left flank pain. She has a residual right ureteral stent in place seen on renal ultrasound and mild right hydronephrosis.  There was no left-sided hydronephrosis or pyelonephritis signs noted on renal ultrasound.  She was started on vancomycin, Rocephin, Flagyl and vascular surgery was consulted.  Interval History:  Seen this morning.  She had multiple concerns that I helped address.  Medications were modified to her preferences.  I also called and discussed her case with urology per her request.  She will need an outpatient follow-up with urology to address  her residual right ureteral stent which has been in place since July 2021, they will work on getting her an appointment as well.  Her blood-tinged urine could possibly be due to the ureteral stent and I informed her we are also treating for urine infection regardless. She understands she will be evaluated by vascular surgery today as well.  We discussed that if some amputation is recommended, it might be more prudent to do it sooner than later to avoid further surgery if able.  She will think about this as well.  Lastly, we reviewed her UDS.  She states that she has not used cocaine in approximately 1 year however does use marijuana and thinks that it was contaminated as she was using her friends joint that day not her own.  Assessment & Plan:  Cellulitis Diabetic foot infection - worsening left great toe infection; agree at risk for progression - follow up vascular rec's.  I talked with patient and recommended that if amputation is the recommendation, she should consider this as to avoid further high amputation in the future if need be -Continue antibiotics for now and await further vascular surgery discussions - Follow blood cultures - Appreciate wound care consult  PAD - s/p multiple stents notably on left side - was on triple therapy at home (Coumadin, asa, plavix), due to some hematuria albeit not severe, we are modifying her regimen to Coumadin and aspirin at time of discharge -For now continuing on heparin drip in case of intervention   Acute cystitis -No pyelonephritis noted on renal ultrasound - Does have residual right ureteral stent in place from July 2021.  Discussed with urology, she will follow-up outpatient for probable stent removal.  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 -Family history of VTE as well -Hold Coumadin  -Continue heparin for now until further discussions with vascular in case of intervention   Acute metabolic encephalopathy - resolved  -Patient lethargic on admission, possibly polypharmacy and substance abuse as UDS showed THC and cocaine -Patient endorses only THC use and has not done cocaine in approximately 1 year but was using friends supply of marijuana -This morning she is awake, alert, oriented and back to baseline - Okay to resume home Klonopin dosing -Oxycodone increased for further pain control with underlying infection   Anemia of chronic disease - On admission hemoglobin 11.1 g/dL which appears improved from previous hospitalization, but patient noted to have significant hematuria.  She is on anticoagulation and dual antiplatelet therapy. -Continue to monitor H&H   Chronic kidney disease stage IIIb - On admission creatinine 1.59 with BUN 13 which appears around patient's most recent baseline. -Continue to monitor   Essential hypertension - Patient's initial blood pressures were noted to be soft 85/70. -Hold home blood pressures. Continue to monitor and resume when medically appropriate   Diabetes mellitus type 2, uncontrolled -  Last hemoglobin A1c was 10.6.  Home medication regimen includes Lantus 60 units daily and moderate sliding scale of insulin with meals. -Hypoglycemic protocols -CBGs before every meal with moderate SSI -Adjust insulin regimen as needed   Anxiety and depression -Increase Klonopin back to home dosing.  Database reviewed   Polysubstance abuse: Patient continues to smoke cigarettes daily basis, but also has a prior history of substance abuse including cocaine. -Nicotine patch offered -Continue to counsel on need of cessation of tobacco use  Old records reviewed in assessment of this patient  Antimicrobials: Vancomycin 10/11/2021 >>  current Rocephin 10/11/2021 >> current Flagyl 10/11/2021 >> current  DVT prophylaxis: Heparin drip  Code Status:   Code Status: Full Code  Disposition Plan:  Home Status is: Inpt  Objective: Blood pressure 130/84, pulse 86, temperature 98.1 F (36.7 C), temperature source Oral, resp. rate 18, weight 113.4 kg, SpO2 96 %.  Examination:  Physical Exam Constitutional:      Appearance: Normal appearance.  HENT:     Head: Normocephalic and atraumatic.     Mouth/Throat:     Mouth: Mucous membranes are moist.  Eyes:     Extraocular Movements: Extraocular movements intact.  Cardiovascular:     Rate and Rhythm: Normal rate and regular rhythm.  Pulmonary:     Effort: Pulmonary effort is normal.     Breath sounds: Normal breath sounds.  Abdominal:     General: Bowel sounds are normal. There is no distension.     Palpations: Abdomen is soft.     Tenderness: There is no abdominal tenderness.  Musculoskeletal:     Cervical back: Normal range of motion and neck supple.     Comments: Left foot wrapped in bandage with blood tinged dressing noted  Skin:    General: Skin is warm and dry.  Neurological:     General: No focal deficit present.     Mental Status: She is alert.  Psychiatric:        Mood and Affect: Mood normal.        Behavior: Behavior normal.  Consultants:  Vascular surgery  Procedures:    Data Reviewed: I have personally reviewed labs and imaging studies    LOS: 1 day  Time spent: Greater than 50% of the 35 minute visit was spent in counseling/coordination of care for the patient as laid out in the A&P.   Dwyane Dee, MD [facility] 10/12/2021, 12:07 PM

## 2021-10-12 NOTE — Progress Notes (Signed)
Pt requesting additional pain meds, states that pain is 10/10. Paged provider Bridgett Larsson , states that no additional meds will be ordered at this time. Offered and gave patient PRN Gabapentin. Will continue to monitor, call bell within reach.

## 2021-10-12 NOTE — Progress Notes (Signed)
ANTICOAGULATION CONSULT NOTE - Follow Up Consult  Pharmacy Consult for Heparin Indication:  hx VTE, limb ischemia  Allergies  Allergen Reactions   Ciprofloxacin Hcl Hives   Dilaudid [Hydromorphone Hcl] Shortness Of Breath and Other (See Comments)    "Asystole," per pt report   Macrobid [Nitrofurantoin Macrocrystal] Hives, Shortness Of Breath and Rash   Other Hives, Shortness Of Breath and Rash    NO "-CILLINS"!!!   Penicillins Shortness Of Breath    Had had cephalosporins without incident Has patient had a PCN reaction causing immediate rash, facial/tongue/throat swelling, SOB or lightheadedness with hypotension: Yes Has patient had a PCN reaction causing severe rash involving mucus membranes or skin necrosis: Unk Has patient had a PCN reaction that required hospitalization: Unk Has patient had a PCN reaction occurring within the last 10 years: No If all of the above answers are "NO", then may proceed with Cephalosporin use.    Sulfa Antibiotics Hives, Shortness Of Breath and Rash   Lexapro [Escitalopram Oxalate] Other (See Comments)    "I just did not like it."    Patient Measurements: Height: 5\' 7"  (170.2 cm) Weight: 113.4 kg (250 lb) IBW/kg (Calculated) : 61.6 Heparin Dosing Weight: 91 kg  Vital Signs: Temp: 97.8 F (36.6 C) (01/09 1704) Temp Source: Oral (01/09 1704) BP: 134/91 (01/09 1704) Pulse Rate: 85 (01/09 1704)  Labs: Recent Labs    10/11/21 0235 10/11/21 0509 10/12/21 0305 10/12/21 1650  HGB 11.1*  --  10.2*  --   HCT 36.2  --  32.3*  --   PLT 499*  --  402*  --   LABPROT 24.2*  --  22.2*  --   INR 2.2*  --  1.9*  --   HEPARINUNFRC  --   --   --  <0.10*  CREATININE 1.59*  --  1.71*  --   TROPONINIHS 5 5  --   --     Estimated Creatinine Clearance: 53.4 mL/min (A) (by C-G formula based on SCr of 1.71 mg/dL (H)).  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 Ischemic limb and warfarin held for possible  procedure with VVS.  INR therapeutic on admission at 2.2 > down to 1.9 today and IV heparin begun.    Initial heparin level is undetectable (< 0.10) on 1400 units/hr.  No known infusion problems.  Noted blood-tinged urine, will monitor.   Goal of Therapy:  Heparin level 0.3-0.7 units/ml Monitor platelets by anticoagulation protocol: Yes   Plan:  Increase heparin drip to 1700 units/hr Next heparin level with am labs, should be ~8 hours after rate change. Daily heparin level, CBC and PT/INR. Warfarin on hold. Monitor hematuria with you.  Arty Baumgartner, RPh 10/12/2021,7:56 PM

## 2021-10-12 NOTE — Hospital Course (Addendum)
Terri Sparks is a 46 y.o. 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   Hospitalized from 09/17/2021-09/26/2021 after being found to have an acute ischemic left lower extremity for which she underwent left extremity arteriogram with initiation of thrombolysis.  TRH had been consulted as patient was incidentally noted to be COVID-19 positive, but was otherwise asymptomatic not requiring treatment.  During her hospitalization she reports that she had stubbed her toe on a IV pole, and it happened again when she got home bed.  She had been keeping the wound clean with antibacterial soap, peroxide, and Neosporin.  However, patient notes that her first second and third toe on the left started to turn more dark in appearance with redness on the dorsal aspect of the foot.    On admission she was also complaining of some blood tinged urine and left flank pain. She has a residual right ureteral stent in place seen on renal ultrasound and mild right hydronephrosis.  There was no left-sided hydronephrosis or pyelonephritis signs noted on renal ultrasound.  She was started on vancomycin, Rocephin, Flagyl and vascular surgery was consulted.

## 2021-10-12 NOTE — Progress Notes (Signed)
Inpatient Diabetes Program Recommendations  AACE/ADA: New Consensus Statement on Inpatient Glycemic Control (2015)  Target Ranges:  Prepandial:   less than 140 mg/dL      Peak postprandial:   less than 180 mg/dL (1-2 hours)      Critically ill patients:  140 - 180 mg/dL   Lab Results  Component Value Date   GLUCAP 156 (H) 10/12/2021   HGBA1C 10.6 (H) 09/17/2021    Review of Glycemic Control  Diabetes history: DM Outpatient Diabetes medications: Lantus 40 units QHS, Novolog 0-15 units TID Current orders for Inpatient glycemic control: Novolog 0-15 unit TID and Semglee 20 units QHS  Received referral for glucometer and patient education.  The DM team spoke with this patient during her last admission on 09/21/21.  Education was completed and a request for a glucometer was placed.  A Freestyle Libre was placed on her prior to DC and an extra sensor was given to her equally a months supply.   Glucometer and supplies-order# [id number]  Will continue to follow while inpatient.  Thank you, Reche Dixon, MSN, RN Diabetes Coordinator Inpatient Diabetes Program [phone] (team pager from 8a-5p)

## 2021-10-12 NOTE — Progress Notes (Signed)
ANTICOAGULATION CONSULT NOTE - Initial Consult  Pharmacy Consult for heparin Indication: hx of VTE, limb ischemia  Allergies  Allergen Reactions   Ciprofloxacin Hcl Hives   Dilaudid [Hydromorphone Hcl] Shortness Of Breath and Other (See Comments)    "Asystole," per pt report   Macrobid [Nitrofurantoin Macrocrystal] Hives, Shortness Of Breath and Rash   Other Hives, Shortness Of Breath and Rash    NO "-CILLINS"!!!   Penicillins Shortness Of Breath    Had had cephalosporins without incident Has patient had a PCN reaction causing immediate rash, facial/tongue/throat swelling, SOB or lightheadedness with hypotension: Yes Has patient had a PCN reaction causing severe rash involving mucus membranes or skin necrosis: Unk Has patient had a PCN reaction that required hospitalization: Unk Has patient had a PCN reaction occurring within the last 10 years: No If all of the above answers are "NO", then may proceed with Cephalosporin use.    Sulfa Antibiotics Hives, Shortness Of Breath and Rash   Lexapro [Escitalopram Oxalate] Other (See Comments)    "I just did not like it."    Patient Measurements: Weight: 113.4 kg (250 lb) Heparin Dosing Weight: 91kg  Vital Signs: Temp: 97.9 F (36.6 C) (01/09 0533) Temp Source: Oral (01/09 0533) BP: 115/62 (01/09 0533) Pulse Rate: 94 (01/09 0533)  Labs: Recent Labs    10/11/21 0235 10/11/21 0509 10/12/21 0305  HGB 11.1*  --  10.2*  HCT 36.2  --  32.3*  PLT 499*  --  402*  LABPROT 24.2*  --  22.2*  INR 2.2*  --  1.9*  CREATININE 1.59*  --  1.71*  TROPONINIHS 5 5  --      Estimated Creatinine Clearance: 53.4 mL/min (A) (by C-G formula based on SCr of 1.71 mg/dL (H)).   Medical History: Past Medical History:  Diagnosis Date   Anxiety    Depression    Diabetes ([place])    Type 2   GERD (gastroesophageal reflux disease)    History of blood clots    ,DVT-left leg, and early 2000, had blood clot behind left breast   History of kidney  stones    Hypercalcemia    Hyperparathyroidism    Hypertension    had been on Lisinopril and PCP took her off it 4 months ago   Iron deficiency anemia    Left thyroid nodule    Mass of right ovary    Nephrolithiasis    PAOD (peripheral arterial occlusive disease) (H[facility])    Peripheral vascular disease ([facility])    Prolonged Q-T interval on ECG 04/19/2019   Renal calculi    Substance abuse ([facility])    Tooth loose    top tooth from the right  is loose     Assessment: 69 YOF presenting with worsening foot wound, 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  INR therapeutic on admission at 2.2  INR down to 1.9 today so we ill start heparin.   Goal of Therapy:  Heparin level 0.3-0.7 units/ml Monitor platelets by anticoagulation protocol: Yes   Plan:  Heparin drip at 1400 units/hr 8hr HL Daily heparin level and CBC  Onnie Boer, PharmD, BCIDP, AAHIVP, CPP Infectious Disease Pharmacist 10/12/2021 8:14 AM

## 2021-10-12 NOTE — Progress Notes (Signed)
°  Transition of Care (TOC) Screening Note   Patient Details  Name: ALVERIA MCGLAUGHLIN Date of Birth: 01/07/1976   Transition of Care [facility]) CM/SW Contact:    Tom-Johnson, Renea Ee, RN Phone Number: 10/12/2021, 4:13 PM   Transition of Care Department [facility]) has reviewed patient and no TOC needs have been identified at this time. We will continue to monitor patient advancement through interdisciplinary progression rounds. If new patient transition needs arise, please place a TOC consult.

## 2021-10-12 NOTE — Plan of Care (Signed)

## 2021-10-13 ENCOUNTER — Telehealth (HOSPITAL_COMMUNITY): Payer: Self-pay | Admitting: *Deleted

## 2021-10-13 DIAGNOSIS — L03116 Cellulitis of left lower limb: Secondary | ICD-10-CM | POA: Diagnosis not present

## 2021-10-13 DIAGNOSIS — N390 Urinary tract infection, site not specified: Secondary | ICD-10-CM | POA: Diagnosis not present

## 2021-10-13 LAB — PROTIME-INR
INR: 1.3 — ABNORMAL HIGH (ref 0.8–1.2)
Prothrombin Time: 16.3 seconds — ABNORMAL HIGH (ref 11.4–15.2)

## 2021-10-13 LAB — CBC WITH DIFFERENTIAL/PLATELET
Abs Immature Granulocytes: 0.05 10*3/uL (ref 0.00–0.07)
Basophils Absolute: 0.1 10*3/uL (ref 0.0–0.1)
Basophils Relative: 1 %
Eosinophils Absolute: 0.7 10*3/uL — ABNORMAL HIGH (ref 0.0–0.5)
Eosinophils Relative: 8 %
HCT: 32.2 % — ABNORMAL LOW (ref 36.0–46.0)
Hemoglobin: 9.9 g/dL — ABNORMAL LOW (ref 12.0–15.0)
Immature Granulocytes: 1 %
Lymphocytes Relative: 27 %
Lymphs Abs: 2.6 10*3/uL (ref 0.7–4.0)
MCH: 28.9 pg (ref 26.0–34.0)
MCHC: 30.7 g/dL (ref 30.0–36.0)
MCV: 94.2 fL (ref 80.0–100.0)
Monocytes Absolute: 0.7 10*3/uL (ref 0.1–1.0)
Monocytes Relative: 7 %
Neutro Abs: 5.4 10*3/uL (ref 1.7–7.7)
Neutrophils Relative %: 56 %
Platelets: 397 10*3/uL (ref 150–400)
RBC: 3.42 MIL/uL — ABNORMAL LOW (ref 3.87–5.11)
RDW: 24.2 % — ABNORMAL HIGH (ref 11.5–15.5)
WBC: 9.5 10*3/uL (ref 4.0–10.5)
nRBC: 0 % (ref 0.0–0.2)

## 2021-10-13 LAB — GLUCOSE, CAPILLARY
Glucose-Capillary: 177 mg/dL — ABNORMAL HIGH (ref 70–99)
Glucose-Capillary: 204 mg/dL — ABNORMAL HIGH (ref 70–99)
Glucose-Capillary: 188 mg/dL — ABNORMAL HIGH (ref 70–99)
Glucose-Capillary: 177 mg/dL — ABNORMAL HIGH (ref 70–99)

## 2021-10-13 LAB — BASIC METABOLIC PANEL
Anion gap: 8 (ref 5–15)
BUN: 15 mg/dL (ref 6–20)
CO2: 22 mmol/L (ref 22–32)
Calcium: 9 mg/dL (ref 8.9–10.3)
Chloride: 105 mmol/L (ref 98–111)
Creatinine, Ser: 1.43 mg/dL — ABNORMAL HIGH (ref 0.44–1.00)
GFR, Estimated: 46 mL/min — ABNORMAL LOW (ref >60)
Glucose, Bld: 157 mg/dL — ABNORMAL HIGH (ref 70–99)
Potassium: 4.2 mmol/L (ref 3.5–5.1)
Sodium: 135 mmol/L (ref 135–145)

## 2021-10-13 LAB — MAGNESIUM: Magnesium: 1.8 mg/dL (ref 1.7–2.4)

## 2021-10-13 LAB — HEPARIN LEVEL (UNFRACTIONATED)
Heparin Unfractionated: 0.13 IU/mL — ABNORMAL LOW (ref 0.30–0.70)
Heparin Unfractionated: 0.3 IU/mL (ref 0.30–0.70)

## 2021-10-13 MED ORDER — WARFARIN - PHARMACIST DOSING INPATIENT: Freq: Every day | Status: DC

## 2021-10-13 MED ORDER — WARFARIN SODIUM 10 MG PO TABS
10.0000 mg | ORAL_TABLET | Freq: Once | ORAL | Status: AC
  Administered 2021-10-13: 10 mg via ORAL
  Filled 2021-10-13: qty 1

## 2021-10-13 MED ORDER — HYDROXYZINE HCL 10 MG PO TABS
10.0000 mg | ORAL_TABLET | Freq: Three times a day (TID) | ORAL | Status: DC | PRN
  Administered 2021-10-13 – 2021-10-15 (×5): 10 mg via ORAL
  Filled 2021-10-13 (×5): qty 1

## 2021-10-13 NOTE — Telephone Encounter (Signed)
Call from patient, she is in the hospital having a foot wound checked. She is calling me because she needs a new rx for her Hydroxyzine and Klonopin, also doesn't like the Trazadone she makes it make her feel too medicated and too out of it and doesn't think a lower dose would be better. Will inform Eddie PA of her concern and as it is out sick today maybe be a day or two before she hears back from [name].

## 2021-10-13 NOTE — Progress Notes (Signed)
PROGRESS NOTE    Terri Sparks  PJA:[id number] DOB: 04-Sep-1976 DOA: 10/11/2021 PCP: Ladell Pier, MD   Chief Complaint  Patient presents with   Foot Injury  Brief Narrative/Hospital Course: Terri Sparks, 46 y.o. 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  Hospitalized from 09/17/2021-09/26/2021 after being found to have an acute ischemic left lower extremity for which she underwent left extremity arteriogram with initiation of thrombolysis, had an incidentally noted COVID positive. During her hospitalization she reports that she had stubbed her toe on a IV pole, and it happened again when she got home bed.  She had been keeping the wound clean with antibacterial soap, peroxide, and Neosporin.  However, patient notes that her first second and third toe on the left started to turn more dark in appearance with redness on the dorsal aspect of the foot so presented and was admitted, also had blood-tinged urine and left flank pain with previous right ureteral stent in place.   Patient was admitted with mild hematuria (has R ureteral stent since July 2021), left flank pain (nothing concerning on ultrasound), and left toe infection non healing (bad vasculopath) Vascular surgery wound care following   Subjective: Seen and examined this morning.  Patient reports her left leg appears much improved cellulitis has improved, denies any complaint  Assessment & Plan:  Sepsis POA Cellulitis left lower extremity  Diabetic foot infection of left great toe: Sepsis multifactorial due to cellulitis foot infection and UTI.  Presented with worsening left great toe infection-manage IV antibiotics seen by vascular,  clinically improving, continue aggressive wound care, vascular surgery following-it appears no plan for immediate amputation and continued aggressive wound care,Cont her aspirin and statin.  If wound does not heal well she may need amputation. Cont vancomycin/ceftriaxone.  Anxiety and depression: Back on home Klonopin dose.  Monitor  Polysubstance abus/tobacco abuse Acute metabolic encephalopathy: lethargic on admission likely polypharmacy UDS showed THC and cocaine.  Mentation improved.  Back on home Klonopin, oxycodone  HTN: BP is controlled.  Home meds on hold  CKD stage IIIb: Creatinine is stable. Recent Labs  Lab 10/11/21 0235 10/12/21 0305 10/13/21 0239  BUN 13 16 15   CREATININE 1.59* 1.71* 1.43*     Acute lower UTI/cystitis Hematuria Right ureteral stent in place since July 2021: No evidence of pyelonephritis on renal ultrasoundUrology was consulted and discussed by previous attending and planning for probable stent removal outpatient, hematuria likely contributed by underlying stent in the setting of anticoagulation.  Continue current antibiotics, hemoglobin is stable, urine culture not available but blood culture negative so far.  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    Anemia of chronic disease: Hb 11 g on admission monitor transfuse for less than 7 Recent Labs  Lab 10/11/21 0235 10/12/21 0305 10/13/21 0239  HGB 11.1* 10.2* 9.9*  HCT 36.2 32.3* 32.2*     PVD S/P multiple stents on left side, on triple therapy with Coumadin aspirin Plavix-now changed to Coumadin and aspirin.   Class II Obesity:Patient's Body mass index is 39.16 kg/m. : Will benefit with PCP follow-up, weight loss  healthy lifestyle  and outpatient sleep evaluation.  DVT prophylaxis: heparin  gtt Code Status:   Code Status: Full Code Family Communication: plan of care discussed with patient at bedside. Status is: Inpatient Remains inpatient appropriate because: Ongoing management of left foot infection, transitioning to Coumadin Disposition: Currently not medically stable for discharge. Anticipated Disposition: Home  Total time spent in the care of this patient 35 MINUTES Objective: Vitals last 24 hrs: Vitals:   10/12/21 1900 10/12/21 2041 10/13/21 0535 10/13/21 0912  BP:  116/69 123/75 (!) 143/95  Pulse:  85 89 89  Resp:  18 17 17   Temp:  (!) 97.5 F (36.4 C) 98.5 F (36.9 C) 97.8 F (36.6 C)  TempSrc:  Oral  Oral  SpO2:  99% 94% 95%  Weight:      Height: 5\' 7"  (1.702 m)      Weight change:   Intake/Output Summary (Last 24 hours) at 10/13/2021 1051 Last data filed at 10/13/2021 0900 Gross per 24 hour  Intake 2541.98 ml  Output 0 ml  Net 2541.98 ml   Net IO Since Admission: 3,601.98 mL [10/13/21 1051]   Physical Examination: General exam: AA0x3, weak,older than stated age. HEENT:Oral mucosa moist, Ear/Nose WNL grossly,dentition normal. Respiratory system: B/l clear BS, no use of accessory muscle, non tender. Cardiovascular system: S1 & S2 +,No JVD. Gastrointestinal system: Abdomen soft, NT,ND, BS+. Nervous System:Alert, awake, moving extremities. Extremities: edema none, left foot with dressing in place did not remove, left leg cellulitis has improved distal peripheral pulses palpable.  Skin: No rashes, no icterus. MSK: Normal muscle bulk, tone, power.  Medications reviewed:  Scheduled Meds:  amLODipine  5 mg Oral q morning   aspirin EC  81 mg Oral Daily   atorvastatin  40 mg Oral Daily   buPROPion  150 mg Oral BID   clopidogrel  75 mg Oral Daily   insulin aspart  0-15 Units Subcutaneous TID WC   insulin glargine-yfgn  20 Units Subcutaneous QHS   nicotine  21 mg Transdermal Daily   olopatadine  1 drop Both Eyes BID   sertraline  150 mg Oral q  morning   sodium chloride flush  3 mL Intravenous Q12H   Continuous Infusions:  cefTRIAXone (ROCEPHIN)  IV 2 g (10/13/21 0828)   heparin 2,000 Units/hr (10/13/21 0430)   metronidazole 500 mg (10/13/21 0914)   vancomycin 1,000 mg (10/12/21 1224)   Diet Order             Diet heart healthy/carb modified Room service appropriate? Yes; Fluid consistency: Thin  Diet effective now                          Weight change:   Wt Readings from Last 3 Encounters:  10/11/21 113.4 kg  09/26/21 73 kg  07/21/21 117.4 kg     Consultants:see note  Procedures:see note Antimicrobials: Anti-infectives (From admission, onward)    Start     Dose/Rate Route Frequency Ordered Stop   10/12/21 1000  vancomycin (VANCOCIN) IVPB 1000 mg/200 mL premix        1,000 mg 200 mL/hr over 60 Minutes Intravenous Every 24 hours 10/11/21 0901     10/11/21 0915  vancomycin (VANCOCIN) 2,250 mg in sodium chloride 0.9 % 500 mL IVPB        2,250 mg 250 mL/hr over 120 Minutes Intravenous  Once 10/11/21 0901 10/11/21 1341   10/11/21 0900  cefTRIAXone (ROCEPHIN) 2 g in sodium chloride 0.9 % 100  mL IVPB        2 g 200 mL/hr over 30 Minutes Intravenous Every 24 hours 10/11/21 0849 10/18/21 0859   10/11/21 0900  metroNIDAZOLE (FLAGYL) IVPB 500 mg        500 mg 100 mL/hr over 60 Minutes Intravenous Every 12 hours 10/11/21 0849 10/18/21 0859      Culture/Microbiology    Component Value Date/Time   SDES BLOOD LEFT FOREARM 10/11/2021 0915   SPECREQUEST  10/11/2021 0915    BOTTLES DRAWN AEROBIC AND ANAEROBIC Blood Culture adequate volume   CULT  10/11/2021 0915    NO GROWTH 2 DAYS Performed at [facility], [place] [address], [place],  [address]    REPTSTATUS PENDING 10/11/2021 0915    Other culture-see note  Unresulted Labs (From admission, onward)     Start     Ordered   10/13/21 1030  Heparin level (unfractionated)  Once-Timed,   TIMED       Question:  Specimen collection method  Answer:   Lab=Lab collect   10/13/21 0419   10/13/21 0500  Heparin level (unfractionated)  Daily,   R     Question:  Specimen collection method  Answer:  Lab=Lab collect   10/12/21 0816   10/13/21 7741  Basic metabolic panel  Daily,   R     Question:  Specimen collection method  Answer:  Lab=Lab collect   10/12/21 1425   10/13/21 0500  CBC with Differential/Platelet  Daily,   R     Question:  Specimen collection method  Answer:  Lab=Lab collect   10/12/21 1425   10/13/21 0500  Magnesium  Daily,   R     Question:  Specimen collection method  Answer:  Lab=Lab collect   10/12/21 1425   10/13/21 0500  Protime-INR  Daily,   R     Question:  Specimen collection method  Answer:  Lab=Lab collect   10/12/21 1950   10/11/21 0905  Urine Culture  Add-on,   AD       Question:  Indication  Answer:  Dysuria   10/11/21 0904          Data Reviewed: I have personally reviewed following labs and imaging studies CBC: Recent Labs  Lab 10/11/21 0235 10/12/21 0305 10/13/21 0239  WBC 12.4* 10.2 9.5  NEUTROABS 7.7  --  5.4  HGB 11.1* 10.2* 9.9*  HCT 36.2 32.3* 32.2*  MCV 95.8 94.2 94.2  PLT 499* 402* 287   Basic Metabolic Panel: Recent Labs  Lab 10/11/21 0235 10/12/21 0305 10/13/21 0239  NA 135 133* 135  K 4.6 4.2 4.2  CL 102 100 105  CO2 25 24 22   GLUCOSE 235* 191* 157*  BUN 13 16 15   CREATININE 1.59* 1.71* 1.43*  CALCIUM 10.0 9.5 9.0  MG  --   --  1.8   GFR: Estimated Creatinine Clearance: 63.9 mL/min (A) (by C-G formula based on SCr of 1.43 mg/dL (H)). Liver Function Tests: Recent Labs  Lab 10/11/21 0235  AST 13*  ALT 14  ALKPHOS 83  BILITOT 0.4  PROT 7.8  ALBUMIN 3.1*   No results for input(s): LIPASE, AMYLASE in the last 168 hours. No results for input(s): AMMONIA in the last 168 hours. Coagulation Profile: Recent Labs  Lab 10/11/21 0235 10/12/21 0305 10/13/21 0239  INR 2.2* 1.9* 1.3*   Cardiac Enzymes: No results for input(s): CKTOTAL, CKMB, CKMBINDEX, TROPONINI in  the last 168 hours. BNP (last 3 results) No results  for input(s): PROBNP in the last 8760 hours. HbA1C: No results for input(s): HGBA1C in the last 72 hours. CBG: Recent Labs  Lab 10/12/21 0653 10/12/21 1121 10/12/21 1636 10/12/21 2040 10/13/21 0625  GLUCAP 154* 156* 180* 280* 177*   Lipid Profile: No results for input(s): CHOL, HDL, LDLCALC, TRIG, CHOLHDL, LDLDIRECT in the last 72 hours. Thyroid Function Tests: No results for input(s): TSH, T4TOTAL, FREET4, T3FREE, THYROIDAB in the last 72 hours. Anemia Panel: No results for input(s): VITAMINB12, FOLATE, FERRITIN, TIBC, IRON, RETICCTPCT in the last 72 hours. Sepsis Labs: Recent Labs  Lab 10/11/21 0901  LATICACIDVEN 0.9    Recent Results (from the past 240 hour(s))  Blood Cultures x 2 sites     Status: None (Preliminary result)   Collection Time: 10/11/21  8:05 AM   Specimen: BLOOD  Result Value Ref Range Status   Specimen Description BLOOD RIGHT ANTECUBITAL  Final   Special Requests   Final    BOTTLES DRAWN AEROBIC AND ANAEROBIC Blood Culture adequate volume   Culture   Final    NO GROWTH 2 DAYS Performed at [facility], [address] [place], [place] [address]    Report Status PENDING  Incomplete  Blood Cultures x 2 sites     Status: None (Preliminary result)   Collection Time: 10/11/21  9:15 AM   Specimen: BLOOD LEFT FOREARM  Result Value Ref Range Status   Specimen Description BLOOD LEFT FOREARM  Final   Special Requests   Final    BOTTLES DRAWN AEROBIC AND ANAEROBIC Blood Culture adequate volume   Culture   Final    NO GROWTH 2 DAYS Performed at [facility], [place] [address], [place], [place] [address]    Report Status PENDING  Incomplete  Resp Panel by RT-PCR (Flu A&B, Covid) Nasopharyngeal Swab     Status: None   Collection Time: 10/11/21  9:31 AM   Specimen: Nasopharyngeal Swab; Nasopharyngeal(NP) swabs in vial transport medium  Result Value Ref Range Status   SARS Coronavirus 2 by RT PCR  NEGATIVE NEGATIVE Final    Comment: (NOTE) SARS-CoV-2 target nucleic acids are NOT DETECTED.  The SARS-CoV-2 RNA is generally detectable in upper respiratory specimens during the acute phase of infection. The lowest concentration of SARS-CoV-2 viral copies this assay can detect is 138 copies/mL. A negative result does not preclude SARS-Cov-2 infection and should not be used as the sole basis for treatment or other patient management decisions. A negative result may occur with  improper specimen collection/handling, submission of specimen other than nasopharyngeal swab, presence of viral mutation(s) within the areas targeted by this assay, and inadequate number of viral copies(<138 copies/mL). A negative result must be combined with clinical observations, patient history, and epidemiological information. The expected result is Negative.  Fact Sheet for Patients:  EntrepreneurPulse.com.au  Fact Sheet for Healthcare Providers:  IncredibleEmployment.be  This test is no t yet approved or cleared by the [place] FDA and  has been authorized for detection and/or diagnosis of SARS-CoV-2 by FDA under an Emergency Use Authorization (EUA). This EUA will remain  in effect (meaning this test can be used) for the duration of the COVID-19 declaration under Section 564(b)(1) of the Act, 21 U.S.C.section 360bbb-3(b)(1), unless the authorization is terminated  or revoked sooner.       Influenza A by PCR NEGATIVE NEGATIVE Final   Influenza B by PCR NEGATIVE NEGATIVE Final    Comment: (NOTE) The Xpert Xpress SARS-CoV-2/FLU/RSV plus assay is  intended as an aid in the diagnosis of influenza from Nasopharyngeal swab specimens and should not be used as a sole basis for treatment. Nasal washings and aspirates are unacceptable for Xpert Xpress SARS-CoV-2/FLU/RSV testing.  Fact Sheet for Patients: EntrepreneurPulse.com.au  Fact Sheet for  Healthcare Providers: IncredibleEmployment.be  This test is not yet approved or cleared by the [place] FDA and has been authorized for detection and/or diagnosis of SARS-CoV-2 by FDA under an Emergency Use Authorization (EUA). This EUA will remain in effect (meaning this test can be used) for the duration of the COVID-19 declaration under Section 564(b)(1) of the Act, 21 U.S.C. section 360bbb-3(b)(1), unless the authorization is terminated or revoked.  Performed at [facility], [place] [address] [place], [place] [address]      Radiology Studies: US RENAL  Result Date: 10/11/2021 CLINICAL DATA:  Pyelonephritis EXAM: RENAL / URINARY TRACT ULTRASOUND COMPLETE COMPARISON:  CT 03/10/2020 FINDINGS: Right Kidney: Renal measurements: 10.4 x 6.2 x 5.6 cm = volume: 188.8 mL. Echogenicity within normal limits. Minimal right hydronephrosis with partially visualized stent. Probable small stone measuring 7 mm at the lower pole. Left Kidney: Renal measurements: 11 x 5.6 x 5.4 cm = volume: 174.5 mL. Echogenicity is normal. No mass or hydronephrosis. Shadowing stone at the midpole measuring 15 mm. Cortical thinning is present. Bladder: Small amount of debris within the bladder Other: None. IMPRESSION: 1. Minimal right hydronephrosis with probable right kidney stone. There is a partially visualized ureteral stent 2. Areas of cortical thinning and scarring in the left kidney. Left kidney stone without hydronephrosis 3. Small amount of debris in the bladder Electronically Signed   By: Donavan Foil M.D.   On: 10/11/2021 17:35     LOS: 2 days   [name], MD [facility]  10/13/2021, 10:51 AM

## 2021-10-13 NOTE — Progress Notes (Signed)
ANTICOAGULATION CONSULT NOTE - Follow Up Consult  Pharmacy Consult for heparin Indication:  limb ischemia and h/o VTE  Labs: Recent Labs    10/11/21 0235 10/11/21 0509 10/12/21 0305 10/12/21 1650 10/13/21 0239  HGB 11.1*  --  10.2*  --  9.9*  HCT 36.2  --  32.3*  --  32.2*  PLT 499*  --  402*  --  397  LABPROT 24.2*  --  22.2*  --   --   INR 2.2*  --  1.9*  --   --   HEPARINUNFRC  --   --   --  <0.10* 0.13*  CREATININE 1.59*  --  1.71*  --  1.43*  TROPONINIHS 5 5  --   --   --     Assessment: 45yo female subtherapeutic on heparin after rate change; no infusion issues though RN notes that IV site was bleeding and required a minute or two of holding pressure.  Goal of Therapy:  Heparin level 0.3-0.7 units/ml   Plan:  Will increase heparin infusion by 3 units/kg/hr to 2000 units/hr and check level in 6 hours.    Wynona Neat, PharmD, BCPS  10/13/2021,4:20 AM

## 2021-10-13 NOTE — Telephone Encounter (Signed)
Provider was contacted by Glory Buff. Olevia Bowens, RN regarding patient's medication refills. Patient's medications to be e-prescribed to pharmacy of choice. Patient to no longer be prescribed trazodone.

## 2021-10-13 NOTE — Progress Notes (Signed)
ANTICOAGULATION CONSULT NOTE - Follow Up Consult  Pharmacy Consult for Heparin and Warfarin Indication:  hx VTE, limb ischemia  Allergies  Allergen Reactions   Ciprofloxacin Hcl Hives   Dilaudid [Hydromorphone Hcl] Shortness Of Breath and Other (See Comments)    "Asystole," per pt report   Macrobid [Nitrofurantoin Macrocrystal] Hives, Shortness Of Breath and Rash   Other Hives, Shortness Of Breath and Rash    NO "-CILLINS"!!!   Penicillins Shortness Of Breath    Had had cephalosporins without incident Has patient had a PCN reaction causing immediate rash, facial/tongue/throat swelling, SOB or lightheadedness with hypotension: Yes Has patient had a PCN reaction causing severe rash involving mucus membranes or skin necrosis: Unk Has patient had a PCN reaction that required hospitalization: Unk Has patient had a PCN reaction occurring within the last 10 years: No If all of the above answers are "NO", then may proceed with Cephalosporin use.    Sulfa Antibiotics Hives, Shortness Of Breath and Rash   Lexapro [Escitalopram Oxalate] Other (See Comments)    "I just did not like it."    Patient Measurements: Height: 5\' 7"  (170.2 cm) Weight: 113.4 kg (250 lb) IBW/kg (Calculated) : 61.6 Heparin Dosing Weight: 91 kg  Vital Signs: Temp: 97.8 F (36.6 C) (01/10 0912) Temp Source: Oral (01/10 0912) BP: 143/95 (01/10 0912) Pulse Rate: 89 (01/10 0912)  Labs: Recent Labs    10/11/21 0235 10/11/21 0509 10/12/21 0305 10/12/21 1650 10/13/21 0239 10/13/21 1034  HGB 11.1*  --  10.2*  --  9.9*  --   HCT 36.2  --  32.3*  --  32.2*  --   PLT 499*  --  402*  --  397  --   LABPROT 24.2*  --  22.2*  --  16.3*  --   INR 2.2*  --  1.9*  --  1.3*  --   HEPARINUNFRC  --   --   --  <0.10* 0.13* 0.30  CREATININE 1.59*  --  1.71*  --  1.43*  --   TROPONINIHS 5 5  --   --   --   --      Estimated Creatinine Clearance: 63.9 mL/min (A) (by C-G formula based on SCr of 1.43 mg/dL  (H)).  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 Ischemic limb and warfarin held for possible procedure with VVS.  INR therapeutic on admission at 2.2 > down to 1.9 on 10/12/21 and IV heparin begun.    Heparin level is now low therapeutic (0.30) on 2000 units/hr.   INR down to 1.3.  Warfarin to resume today. No procedure currently planned.  On day # 3 Metronidazole, so will likely be more sensitive to warfarin doses. Planning 7 days of IV antibiotics. Noted blood-tinged urine, will monitor.   PTA warfarin regimen: 10 mg MWF, 5 mg TTSS.  Last dose 1/7.   Goal of Therapy:  Heparin level 0.3-0.7 units/ml Monitor platelets by anticoagulation protocol: Yes   Plan:  Continue heparin drip at 2000 units/hr Resume warfarin with 10 mg x 1 today. Monitor for sensitivity to warfarin doses while on Metronidazole. Daily heparin level, CBC and PT/INR. Monitor hematuria with you. Follow up antibiotic plans.  Arty Baumgartner, RPh 10/13/2021,12:06 PM

## 2021-10-14 DIAGNOSIS — L03116 Cellulitis of left lower limb: Secondary | ICD-10-CM | POA: Diagnosis not present

## 2021-10-14 LAB — CBC WITH DIFFERENTIAL/PLATELET
Abs Immature Granulocytes: 0.05 10*3/uL (ref 0.00–0.07)
Basophils Absolute: 0.1 10*3/uL (ref 0.0–0.1)
Basophils Relative: 1 %
Eosinophils Absolute: 0.8 10*3/uL — ABNORMAL HIGH (ref 0.0–0.5)
Eosinophils Relative: 8 %
HCT: 33.6 % — ABNORMAL LOW (ref 36.0–46.0)
Hemoglobin: 10.6 g/dL — ABNORMAL LOW (ref 12.0–15.0)
Immature Granulocytes: 1 %
Lymphocytes Relative: 27 %
Lymphs Abs: 2.5 10*3/uL (ref 0.7–4.0)
MCH: 29.7 pg (ref 26.0–34.0)
MCHC: 31.5 g/dL (ref 30.0–36.0)
MCV: 94.1 fL (ref 80.0–100.0)
Monocytes Absolute: 0.6 10*3/uL (ref 0.1–1.0)
Monocytes Relative: 7 %
Neutro Abs: 5.2 10*3/uL (ref 1.7–7.7)
Neutrophils Relative %: 56 %
Platelets: 396 10*3/uL (ref 150–400)
RBC: 3.57 MIL/uL — ABNORMAL LOW (ref 3.87–5.11)
RDW: 24.1 % — ABNORMAL HIGH (ref 11.5–15.5)
WBC: 9.1 10*3/uL (ref 4.0–10.5)
nRBC: 0 % (ref 0.0–0.2)

## 2021-10-14 LAB — BASIC METABOLIC PANEL
Anion gap: 7 (ref 5–15)
BUN: 18 mg/dL (ref 6–20)
CO2: 24 mmol/L (ref 22–32)
Calcium: 9.3 mg/dL (ref 8.9–10.3)
Chloride: 103 mmol/L (ref 98–111)
Creatinine, Ser: 1.52 mg/dL — ABNORMAL HIGH (ref 0.44–1.00)
GFR, Estimated: 43 mL/min — ABNORMAL LOW (ref >60)
Glucose, Bld: 190 mg/dL — ABNORMAL HIGH (ref 70–99)
Potassium: 4.4 mmol/L (ref 3.5–5.1)
Sodium: 134 mmol/L — ABNORMAL LOW (ref 135–145)

## 2021-10-14 LAB — URINE CULTURE: Culture: >=10000 — AB

## 2021-10-14 LAB — GLUCOSE, CAPILLARY
Glucose-Capillary: 178 mg/dL — ABNORMAL HIGH (ref 70–99)
Glucose-Capillary: 201 mg/dL — ABNORMAL HIGH (ref 70–99)
Glucose-Capillary: 201 mg/dL — ABNORMAL HIGH (ref 70–99)
Glucose-Capillary: 201 mg/dL — ABNORMAL HIGH (ref 70–99)

## 2021-10-14 LAB — HEPARIN LEVEL (UNFRACTIONATED)
Heparin Unfractionated: 0.21 IU/mL — ABNORMAL LOW (ref 0.30–0.70)
Heparin Unfractionated: 0.51 IU/mL (ref 0.30–0.70)

## 2021-10-14 LAB — PROTIME-INR
INR: 1.1 (ref 0.8–1.2)
Prothrombin Time: 14.2 seconds (ref 11.4–15.2)

## 2021-10-14 MED ORDER — WARFARIN SODIUM 10 MG PO TABS
10.0000 mg | ORAL_TABLET | Freq: Once | ORAL | Status: AC
  Administered 2021-10-14: 10 mg via ORAL
  Filled 2021-10-14: qty 1

## 2021-10-14 MED ORDER — ALBUTEROL SULFATE HFA 108 (90 BASE) MCG/ACT IN AERS
2.0000 | INHALATION_SPRAY | Freq: Four times a day (QID) | RESPIRATORY_TRACT | Status: DC | PRN
  Administered 2021-10-14: 2 via RESPIRATORY_TRACT
  Filled 2021-10-14: qty 6.7

## 2021-10-14 MED ORDER — VANCOMYCIN HCL 1250 MG/250ML IV SOLN
1250.0000 mg | INTRAVENOUS | Status: DC
  Administered 2021-10-14: 1250 mg via INTRAVENOUS
  Filled 2021-10-14: qty 250

## 2021-10-14 MED ORDER — LINEZOLID 600 MG PO TABS
600.0000 mg | ORAL_TABLET | Freq: Two times a day (BID) | ORAL | Status: DC
  Administered 2021-10-15: 600 mg via ORAL
  Filled 2021-10-14 (×2): qty 1

## 2021-10-14 MED ORDER — CEFDINIR 300 MG PO CAPS
600.0000 mg | ORAL_CAPSULE | Freq: Every day | ORAL | Status: DC
  Administered 2021-10-15: 600 mg via ORAL
  Filled 2021-10-14: qty 2

## 2021-10-14 NOTE — Progress Notes (Signed)
Pharmacy Antibiotic Note  Terri Sparks is a 46 y.o. female admitted on 10/11/2021 with diabetic foot infection.  Pharmacy has been consulted for Vancomycin dosing.  Pt continues on Rocephin and Flagyl per MD. Noted plans for 7d abx per initial consult order.  Stop date of 1/14 in place for all abx.  Consulted to change abx to a PO regimen due to IV line issue. After discussion with Dr. Lupita Leash, we will finish out the course with PO linezolid and cefdinir to complete a total of 7d. She is on an SSRI but the risk for serotonin syndrome is relatively low with a short course. We will avoid flagyl due to interaction with coumadin.   Plan: Dc vanc/ceftriaxone/flagyl Linezolid 600mg  PO BID x6 doses Cefdinir 600mg  PO qday x3 doses  Height: 5\' 7"  (170.2 cm) Weight: 113.4 kg (250 lb) IBW/kg (Calculated) : 61.6  Temp (24hrs), Avg:98.2 F (36.8 C), Min:97.6 F (36.4 C), Max:98.9 F (37.2 C)  Recent Labs  Lab 10/11/21 0235 10/11/21 0901 10/12/21 0305 10/13/21 0239 10/14/21 0143  WBC 12.4*  --  10.2 9.5 9.1  CREATININE 1.59*  --  1.71* 1.43* 1.52*  LATICACIDVEN  --  0.9  --   --   --      Estimated Creatinine Clearance: 60.1 mL/min (A) (by C-G formula based on SCr of 1.52 mg/dL (H)).    Allergies  Allergen Reactions   Ciprofloxacin Hcl Hives   Dilaudid [Hydromorphone Hcl] Shortness Of Breath and Other (See Comments)    "Asystole," per pt report   Macrobid [Nitrofurantoin Macrocrystal] Hives, Shortness Of Breath and Rash   Other Hives, Shortness Of Breath and Rash    NO "-CILLINS"!!!   Penicillins Shortness Of Breath    Had had cephalosporins without incident Has patient had a PCN reaction causing immediate rash, facial/tongue/throat swelling, SOB or lightheadedness with hypotension: Yes Has patient had a PCN reaction causing severe rash involving mucus membranes or skin necrosis: Unk Has patient had a PCN reaction that required hospitalization: Unk Has patient had a PCN reaction  occurring within the last 10 years: No If all of the above answers are "NO", then may proceed with Cephalosporin use.    Sulfa Antibiotics Hives, Shortness Of Breath and Rash   Lexapro [Escitalopram Oxalate] Other (See Comments)    "I just did not like it."    Antimicrobials this admission: CTX  1/8 >> 1/1 Flagyl 1/8 >> 1/11 Vanc 1/8 >> 1/11 Cefdinir 1/12>1/14 Linezolid 1/12>>1/14  Dose adjustments this admission: Vancomycin dose increased 1/11 with improved renal fxn  Microbiology results: 1/8 BCx: ngtd 1/8 UCx: neg  Onnie Boer, PharmD, BCIDP, AAHIVP, CPP Infectious Disease Pharmacist 10/14/2021 3:41 PM

## 2021-10-14 NOTE — Progress Notes (Signed)
Inpatient Diabetes Program Recommendations  AACE/ADA: New Consensus Statement on Inpatient Glycemic Control (2015)  Target Ranges:  Prepandial:   less than 140 mg/dL      Peak postprandial:   less than 180 mg/dL (1-2 hours)      Critically ill patients:  140 - 180 mg/dL    Latest Reference Range & Units 10/13/21 06:25 10/13/21 11:34 10/13/21 16:36 10/13/21 21:18  Glucose-Capillary 70 - 99 mg/dL 177 (H) 204 (H) 188 (H) 177 (H)    Latest Reference Range & Units 10/14/21 06:19  Glucose-Capillary 70 - 99 mg/dL 178 (H)    Latest Reference Range & Units 07/21/21 14:15  HbA1c, POC (controlled diabetic range) 0.0 - 7.0 % 9.5 !    Latest Reference Range & Units 09/17/21 19:27  Hemoglobin A1C 4.8 - 5.6 % 10.6 (H)  (257 mg/dl)    Admit with:  Sepsis POA Cellulitis left lower extremity  Diabetic foot infection of left great toe UTI  History: DM, CKD, Polysubstance Abuse  Home DM Meds: Lantus 40 units QHS        Novolog 2-15 units TID  Current Orders: Semglee 20 units QHS     Novolog Moderate Correction Scale/ SSI (0-15 units) TID AC   PCP: [facility] Rx sent for pt to get Freestyle Libre CGM at that visit Of note, Freestyle Elenor Legato was given and applied to pt on 09/25/2021 during last admission Pt was also counseled by the diabetes Coordinator RN on 09/21/2021    Met with pt at bedside around 12pm today.  Pt told me her Freestyle Libre CGM stopped working about 4 days after she got home from last hospital stay.  Said she didn't have any refills to replace the sensor.  I asked pt why she didn't call her PCP office for refills of the sensor and pt said she didn't know she should call them.  I reviewed last PCP office visit in October and told pt that her PCP stated she sent a Rx to pt's pharmacy for the Esto claimed she did not know this info.  Discussed with pt that we can only provide 1 free sample of the Elenor Legato to her and since  she just got one back on 12/23 we cannot provide another one to her at this time--Pt instructed to call her PCP office and ask PCP office to send refill for the [facility] sensor to her pharmacy.  I reminded pt that she should have removed the sensor from her arm since her reader said there was an error with the sensor and that it needed replacing--also reminded pt that the sensor needs to be changed every 14 days and that she should swap arms every time she replaces the sensor.  When I removed the sensor from the back of her arm (no redness noted when I removed it) upon inspection I found the cannula was bent and this was the likely cause for the malfunction at home.    --Will follow patient during hospitalization--  Wyn Quaker RN, MSN, CDE Diabetes Coordinator Inpatient Glycemic Control Team Team Pager: [phone] (8a-5p)

## 2021-10-14 NOTE — Progress Notes (Signed)
Pt. PIV infiltrated in one day after receiving multiple antibiotics. Notified physician who says PO antibiotics can be tried. Spoke with RN to let us know if we are needed any further.

## 2021-10-14 NOTE — Progress Notes (Signed)
ANTICOAGULATION CONSULT NOTE - Follow Up Consult  Pharmacy Consult for Heparin and Warfarin Indication:  hx VTE, limb ischemia  Allergies  Allergen Reactions   Ciprofloxacin Hcl Hives   Dilaudid [Hydromorphone Hcl] Shortness Of Breath and Other (See Comments)    "Asystole," per pt report   Macrobid [Nitrofurantoin Macrocrystal] Hives, Shortness Of Breath and Rash   Other Hives, Shortness Of Breath and Rash    NO "-CILLINS"!!!   Penicillins Shortness Of Breath    Had had cephalosporins without incident Has patient had a PCN reaction causing immediate rash, facial/tongue/throat swelling, SOB or lightheadedness with hypotension: Yes Has patient had a PCN reaction causing severe rash involving mucus membranes or skin necrosis: Unk Has patient had a PCN reaction that required hospitalization: Unk Has patient had a PCN reaction occurring within the last 10 years: No If all of the above answers are "NO", then may proceed with Cephalosporin use.    Sulfa Antibiotics Hives, Shortness Of Breath and Rash   Lexapro [Escitalopram Oxalate] Other (See Comments)    "I just did not like it."    Patient Measurements: Height: 5\' 7"  (170.2 cm) Weight: 113.4 kg (250 lb) IBW/kg (Calculated) : 61.6 Heparin Dosing Weight: 91 kg  Vital Signs: Temp: 98.1 F (36.7 C) (01/10 2118) Temp Source: Oral (01/10 2118) BP: 121/55 (01/10 2118) Pulse Rate: 88 (01/10 2118)  Labs: Recent Labs     0000 10/11/21 0509 10/12/21 0305 10/12/21 1650 10/13/21 0239 10/13/21 1034 10/14/21 0143  HGB   < >  --  10.2*  --  9.9*  --  10.6*  HCT  --   --  32.3*  --  32.2*  --  33.6*  PLT  --   --  402*  --  397  --  396  LABPROT  --   --  22.2*  --  16.3*  --  14.2  INR  --   --  1.9*  --  1.3*  --  1.1  HEPARINUNFRC  --   --   --    < > 0.13* 0.30 0.21*  CREATININE  --   --  1.71*  --  1.43*  --   --   TROPONINIHS  --  5  --   --   --   --   --    < > = values in this interval not displayed.     Estimated  Creatinine Clearance: 63.9 mL/min (A) (by C-G formula based on SCr of 1.43 mg/dL (H)).  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 Ischemic limb and warfarin held for possible procedure with VVS.  INR therapeutic on admission at 2.2 > down to 1.9 on 10/12/21 and IV heparin begun.  Heparin level is now subtherapeutic again at 0.21 on 2000 units/hr. INR is down to 1.1. Patient has not complained of blood-tinged urine overnight per RN. Of note, patient is on metronidazole which can significantly interact with warfarin.    PTA warfarin regimen: 10 mg MWF, 5 mg TTSS.  Last dose 1/7.   Goal of Therapy:  Heparin level 0.3-0.7 units/ml Monitor platelets by anticoagulation protocol: Yes   Plan:  Repeat warfarin 10 mg x 1 dose today  Increase heparin drip to 2100 units/hr F/u 6 hr HL Daily heparin level, CBC and PT/INR. Monitor for any complaints of hematuria   Albertina Parr, PharmD., BCPS, BCCCP Clinical Pharmacist Please refer to [facility] for unit-specific pharmacist

## 2021-10-14 NOTE — Plan of Care (Signed)

## 2021-10-14 NOTE — Progress Notes (Signed)
PROGRESS NOTE    Terri Sparks  SNK:[id number] DOB: 02-Nov-1975 DOA: 10/11/2021 PCP: Ladell Pier, MD   Chief Complaint  Patient presents with   Foot Injury  Brief Narrative/Hospital Course: Terri Sparks, 46 y.o. 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  Hospitalized from 09/17/2021-09/26/2021 after being found to have an acute ischemic left lower extremity for which she underwent left extremity arteriogram with initiation of thrombolysis, had an incidentally noted COVID positive. During her hospitalization she reports that she had stubbed her toe on a IV pole, and it happened again when she got home bed.  She had been keeping the wound clean with antibacterial soap, peroxide, and Neosporin.  However, patient notes that her first second and third toe on the left started to turn more dark in appearance with redness on the dorsal aspect of the foot so presented and was admitted, also had blood-tinged urine and left flank pain with previous right ureteral stent in place.   Patient was admitted with mild hematuria (has R ureteral stent since July 2021), left flank pain (nothing concerning on ultrasound), and left toe infection non healing (bad vasculopath) Vascular surgery wound care following   Subjective: Seen and examined this morning, she was sleeping comfortably woke up for examination has no complaint. Overnight no fever. On RA Waiting for dressing change on the foot this morning  Assessment & Plan:  Sepsis POA Cellulitis left lower extremity  Diabetic foot infection of left great toe: Sepsis multifactorial due to cellulitis foot infection and UTI.  Presented with worsening left great toe  infection-manage IV antibiotics seen by vascular, clinically improving, continue aggressive wound care, vascular surgery following-it appears no plan for immediate amputation and continued aggressive wound care,Cont her aspirin and statin.  If wound does not heal well she may need amputation.  Remains on vancomycin/ceftriaxone.  PVD S/P multiple stents on left side, on triple therapy with Coumadin aspirin Plavix at home-on last discharge on 09/19/2027 Dr. Luan Pulling recommended indefinite aspirin Plavix and Coumadin.  We will continue since no significant blood loss or hematuria at this time  Anxiety and depression: Continue home Klonopin, added Atarax.  Followed by outpatient psychiatry  Polysubstance abuse/tobacco abuse Acute metabolic encephalopathy: lethargic on admission likely polypharmacy UDS showed THC and cocaine.  Mentation improved.  Overall doing well, tolerating her home regimen of klonopin, oxycodone  HTN: BP is controlled, at times soft, on amlodipine.  CKD stage IIIb: Renal function stable  Recent Labs  Lab 10/11/21 0235 10/12/21 0305 10/13/21 0239 10/14/21 0143  BUN 13 16 15 18   CREATININE 1.59* 1.71* 1.43* 1.52*    Acute lower UTI/cystitis Hematuria Right ureteral stent in place since July 2021: No evidence of pyelonephritis on renal ultrasoundUrology was consulted and discussed by previous attending and planning for probable stent removal outpatient, hematuria likely contributed by underlying stent in the setting of anticoagulation.  Continue current antibiotics, hemoglobin is stable, urine culture not available but blood culture negative so far.    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 Recent Labs  Lab 10/13/21 0625 10/13/21 1134 10/13/21 1636 10/13/21 2118 10/14/21 0619  GLUCAP 177* 204* 188* 177* 178*     Anemia of chronic disease: Hb 11 g on admission, stable, monitor transfuse for less than 7 Recent Labs  Lab 10/11/21 0235 10/12/21 0305 10/13/21 0239 10/14/21 0143  HGB 11.1* 10.2* 9.9* 10.6*  HCT 36.2 32.3* 32.2* 33.6*    Class II Obesity:Patient's Body mass index is 39.16 kg/m. : Will benefit with PCP follow-up, weight loss  healthy lifestyle and outpatient sleep evaluation.  DVT prophylaxis: heparin gtt Code Status:   Code Status: Full Code Family Communication: plan of care discussed with patient at bedside. Status is: Inpatient Remains inpatient appropriate because: Ongoing management of left foot infection, transitioning to Coumadin Disposition: Currently not medically stable for discharge. Anticipated Disposition: Home  Total time spent in the care of this patient 35 MINUTES Objective: Vitals last 24 hrs: Vitals:   10/13/21 1650 10/13/21 2118 10/14/21 0442 10/14/21 0911  BP: (!) 132/57 (!) 121/55 (!) 96/51 109/61  Pulse: 87 88 83 87  Resp: 18 18 16 18   Temp: 98.9 F (37.2 C) 98.1 F (36.7 C) 97.6 F (36.4 C) 98.1 F (36.7 C)  TempSrc: Oral Oral  Oral  SpO2: 100% 97% 100% 96%  Weight:      Height:       Weight change:   Intake/Output Summary (Last 24 hours) at 10/14/2021 1038 Last data filed at 10/14/2021 0600 Gross per 24 hour  Intake 2381.78 ml  Output 0 ml  Net 2381.78 ml   Net IO Since Admission: 5,983.76 mL [10/14/21 1038]   Physical Examination: General exam: AAOx 3, pleasant HEENT:Oral mucosa moist, Ear/Nose WNL grossly, dentition normal. Respiratory system: bilaterally clear, no use of accessory muscle Cardiovascular system: S1 & S2 +, No JVD,. Gastrointestinal system: Abdomen soft, NT,ND, BS+ Nervous System:Alert, awake,  moving extremities and grossly nonfocal Extremities: Left foot w/ dressing intact, cellulitis resolved, distal peripheral pulses palpable.  Skin: No rashes,no icterus. MSK: Normal muscle bulk,tone, power   Medications reviewed:  Scheduled Meds:  amLODipine  5 mg Oral q morning   aspirin EC  81 mg Oral Daily   atorvastatin  40 mg Oral Daily   buPROPion  150 mg Oral BID   clopidogrel  75 mg Oral Daily   insulin aspart  0-15 Units Subcutaneous TID WC   insulin glargine-yfgn  20 Units Subcutaneous QHS   nicotine  21 mg Transdermal Daily   olopatadine  1 drop Both Eyes BID   sertraline  150 mg Oral q morning   sodium chloride flush  3 mL Intravenous Q12H   warfarin  10 mg Oral ONCE-1600   Warfarin - Pharmacist Dosing Inpatient   Does not apply q1600   Continuous Infusions:  cefTRIAXone (ROCEPHIN)  IV 2 g (10/14/21 0848)   heparin 2,100 Units/hr (10/14/21 0458)   metronidazole 500 mg (10/14/21 1002)   vancomycin     Diet Order             Diet heart healthy/carb modified Room service appropriate? Yes; Fluid consistency: Thin  Diet effective now                   Weight change:   Wt Readings from Last 3 Encounters:  10/11/21 113.4 kg  09/26/21 73 kg  07/21/21 117.4 kg     Consultants:see note  Procedures:see note  Antimicrobials: Anti-infectives (From admission, onward)    Start     Dose/Rate Route Frequency Ordered Stop   10/14/21 1100  vancomycin (VANCOREADY) IVPB 1250 mg/250 mL        1,250 mg 166.7 mL/hr over 90 Minutes Intravenous Every 24 hours 10/14/21 1006 10/18/21 1059   10/12/21 1000  vancomycin (VANCOCIN) IVPB 1000 mg/200 mL premix  Status:  Discontinued        1,000 mg 200 mL/hr over 60 Minutes Intravenous Every 24 hours 10/11/21 0901 10/14/21 1006   10/11/21 0915  vancomycin (VANCOCIN) 2,250 mg in sodium chloride 0.9 % 500 mL IVPB        2,250 mg 250 mL/hr over 120 Minutes Intravenous  Once 10/11/21 0901 10/11/21 1341   10/11/21 0900  cefTRIAXone  (ROCEPHIN) 2 g in sodium chloride 0.9 % 100 mL IVPB        2 g 200 mL/hr over 30 Minutes Intravenous Every 24 hours 10/11/21 0849 10/18/21 0859   10/11/21 0900  metroNIDAZOLE (FLAGYL) IVPB 500 mg        500 mg 100 mL/hr over 60 Minutes Intravenous Every 12 hours 10/11/21 0849 10/18/21 0859      Culture/Microbiology    Component Value Date/Time   SDES URINE, CLEAN CATCH 10/11/2021 2218   SPECREQUEST NONE 10/11/2021 2218   CULT (A) 10/11/2021 2218    <10,000 COLONIES/mL >=100,000 COLONIES/mL Performed at [facility], [place] [address], [place], [place] [address]    REPTSTATUS 10/14/2021 FINAL 10/11/2021 2218    Other culture-see note  Unresulted Labs (From admission, onward)     Start     Ordered   10/14/21 1030  Heparin level (unfractionated)  Once-Timed,   TIMED       Question:  Specimen collection method  Answer:  Lab=Lab collect   10/14/21 0351   10/13/21 0500  Heparin level (unfractionated)  Daily,   R     Question:  Specimen collection method  Answer:  Lab=Lab collect   10/12/21 0816   10/13/21 2536  Basic metabolic panel  Daily,   R     Question:  Specimen collection method  Answer:  Lab=Lab collect   10/12/21 1425   10/13/21 0500  CBC with Differential/Platelet  Daily,   R     Question:  Specimen collection method  Answer:  Lab=Lab collect   10/12/21 1425   10/13/21 0500  Protime-INR  Daily,   R     Question:  Specimen collection method  Answer:  Lab=Lab collect   10/12/21 1950          Data Reviewed: I have personally reviewed following labs and imaging studies CBC: Recent Labs  Lab 10/11/21 0235 10/12/21 0305 10/13/21 0239 10/14/21 0143  WBC 12.4* 10.2 9.5 9.1  NEUTROABS 7.7  --  5.4 5.2  HGB 11.1* 10.2* 9.9* 10.6*  HCT 36.2 32.3* 32.2* 33.6*  MCV 95.8 94.2 94.2 94.1  PLT 499* 402* 397 644   Basic Metabolic Panel: Recent Labs  Lab 10/11/21 0235 10/12/21 0305 10/13/21 0239 10/14/21 0143  NA 135 133* 135 134*  K 4.6 4.2 4.2 4.4  CL 102  100 105 103  CO2 25 24 22 24   GLUCOSE 235* 191* 157* 190*  BUN 13 16 15 18   CREATININE 1.59* 1.71* 1.43* 1.52*  CALCIUM 10.0 9.5 9.0 9.3  MG  --   --  1.8  --    GFR: Estimated Creatinine Clearance: 60.1 mL/min (A) (by C-G formula based on SCr  of 1.52 mg/dL (H)). Liver Function Tests: Recent Labs  Lab 10/11/21 0235  AST 13*  ALT 14  ALKPHOS 83  BILITOT 0.4  PROT 7.8  ALBUMIN 3.1*   No results for input(s): LIPASE, AMYLASE in the last 168 hours. No results for input(s): AMMONIA in the last 168 hours. Coagulation Profile: Recent Labs  Lab 10/11/21 0235 10/12/21 0305 10/13/21 0239 10/14/21 0143  INR 2.2* 1.9* 1.3* 1.1   Cardiac Enzymes: No results for input(s): CKTOTAL, CKMB, CKMBINDEX, TROPONINI in the last 168 hours. BNP (last 3 results) No results for input(s): PROBNP in the last 8760 hours. HbA1C: No results for input(s): HGBA1C in the last 72 hours. CBG: Recent Labs  Lab 10/13/21 0625 10/13/21 1134 10/13/21 1636 10/13/21 2118 10/14/21 0619  GLUCAP 177* 204* 188* 177* 178*   Lipid Profile: No results for input(s): CHOL, HDL, LDLCALC, TRIG, CHOLHDL, LDLDIRECT in the last 72 hours. Thyroid Function Tests: No results for input(s): TSH, T4TOTAL, FREET4, T3FREE, THYROIDAB in the last 72 hours. Anemia Panel: No results for input(s): VITAMINB12, FOLATE, FERRITIN, TIBC, IRON, RETICCTPCT in the last 72 hours. Sepsis Labs: Recent Labs  Lab 10/11/21 0901  LATICACIDVEN 0.9    Recent Results (from the past 240 hour(s))  Blood Cultures x 2 sites     Status: None (Preliminary result)   Collection Time: 10/11/21  8:05 AM   Specimen: BLOOD  Result Value Ref Range Status   Specimen Description BLOOD RIGHT ANTECUBITAL  Final   Special Requests   Final    BOTTLES DRAWN AEROBIC AND ANAEROBIC Blood Culture adequate volume   Culture   Final    NO GROWTH 2 DAYS Performed at [facility], [address] [place], [place] [address]    Report Status PENDING   Incomplete  Blood Cultures x 2 sites     Status: None (Preliminary result)   Collection Time: 10/11/21  9:15 AM   Specimen: BLOOD LEFT FOREARM  Result Value Ref Range Status   Specimen Description BLOOD LEFT FOREARM  Final   Special Requests   Final    BOTTLES DRAWN AEROBIC AND ANAEROBIC Blood Culture adequate volume   Culture   Final    NO GROWTH 2 DAYS Performed at [facility], [place] [address] [place], [place] [address]    Report Status PENDING  Incomplete  Resp Panel by RT-PCR (Flu A&B, Covid) Nasopharyngeal Swab     Status: None   Collection Time: 10/11/21  9:31 AM   Specimen: Nasopharyngeal Swab; Nasopharyngeal(NP) swabs in vial transport medium  Result Value Ref Range Status   SARS Coronavirus 2 by RT PCR NEGATIVE NEGATIVE Final    Comment: (NOTE) SARS-CoV-2 target nucleic acids are NOT DETECTED.  The SARS-CoV-2 RNA is generally detectable in upper respiratory specimens during the acute phase of infection. The lowest concentration of SARS-CoV-2 viral copies this assay can detect is 138 copies/mL. A negative result does not preclude SARS-Cov-2 infection and should not be used as the sole basis for treatment or other patient management decisions. A negative result may occur with  improper specimen collection/handling, submission of specimen other than nasopharyngeal swab, presence of viral mutation(s) within the areas targeted by this assay, and inadequate number of viral copies(<138 copies/mL). A negative result must be combined with clinical observations, patient history, and epidemiological information. The expected result is Negative.  Fact Sheet for Patients:  EntrepreneurPulse.com.au  Fact Sheet for Healthcare Providers:  IncredibleEmployment.be  This test is no t yet approved or  cleared by the [place] and  has been authorized for detection and/or diagnosis of SARS-CoV-2 by FDA under an Emergency Use Authorization  (EUA). This EUA will remain  in effect (meaning this test can be used) for the duration of the COVID-19 declaration under Section 564(b)(1) of the Act, 21 U.S.C.section 360bbb-3(b)(1), unless the authorization is terminated  or revoked sooner.       Influenza A by PCR NEGATIVE NEGATIVE Final   Influenza B by PCR NEGATIVE NEGATIVE Final    Comment: (NOTE) The Xpert Xpress SARS-CoV-2/FLU/RSV plus assay is intended as an aid in the diagnosis of influenza from Nasopharyngeal swab specimens and should not be used as a sole basis for treatment. Nasal washings and aspirates are unacceptable for Xpert Xpress SARS-CoV-2/FLU/RSV testing.  Fact Sheet for Patients: EntrepreneurPulse.com.au  Fact Sheet for Healthcare Providers: IncredibleEmployment.be  This test is not yet approved or cleared by the [place] FDA and has been authorized for detection and/or diagnosis of SARS-CoV-2 by FDA under an Emergency Use Authorization (EUA). This EUA will remain in effect (meaning this test can be used) for the duration of the COVID-19 declaration under Section 564(b)(1) of the Act, 21 U.S.C. section 360bbb-3(b)(1), unless the authorization is terminated or revoked.  Performed at [facility], [place] [address], [place], [place] [address]   Urine Culture     Status: Abnormal   Collection Time: 10/11/21 10:18 PM   Specimen: Urine, Clean Catch  Result Value Ref Range Status   Specimen Description URINE, CLEAN CATCH  Final   Special Requests NONE  Final   Culture (A)  Final    <10,000 COLONIES/mL >=100,000 COLONIES/mL Performed at [facility], [place] [address], [place], [place] [address]    Report Status 10/14/2021 FINAL  Final     Radiology Studies: No results found.   LOS: 3 days   [name], MD [facility]  10/14/2021, 10:38 AM

## 2021-10-14 NOTE — Progress Notes (Signed)
ANTICOAGULATION CONSULT NOTE - Follow Up Consult  Pharmacy Consult for Heparin and Warfarin Indication:  hx VTE, limb ischemia  Allergies  Allergen Reactions   Ciprofloxacin Hcl Hives   Dilaudid [Hydromorphone Hcl] Shortness Of Breath and Other (See Comments)    "Asystole," per pt report   Macrobid [Nitrofurantoin Macrocrystal] Hives, Shortness Of Breath and Rash   Other Hives, Shortness Of Breath and Rash    NO "-CILLINS"!!!   Penicillins Shortness Of Breath    Had had cephalosporins without incident Has patient had a PCN reaction causing immediate rash, facial/tongue/throat swelling, SOB or lightheadedness with hypotension: Yes Has patient had a PCN reaction causing severe rash involving mucus membranes or skin necrosis: Unk Has patient had a PCN reaction that required hospitalization: Unk Has patient had a PCN reaction occurring within the last 10 years: No If all of the above answers are "NO", then may proceed with Cephalosporin use.    Sulfa Antibiotics Hives, Shortness Of Breath and Rash   Lexapro [Escitalopram Oxalate] Other (See Comments)    "I just did not like it."    Patient Measurements: Height: 5\' 7"  (170.2 cm) Weight: 113.4 kg (250 lb) IBW/kg (Calculated) : 61.6 Heparin Dosing Weight: 91 kg  Vital Signs: Temp: 98.1 F (36.7 C) (01/11 0911) Temp Source: Oral (01/11 0911) BP: 109/61 (01/11 0911) Pulse Rate: 87 (01/11 0911)  Labs: Recent Labs    10/12/21 0305 10/12/21 1650 10/13/21 0239 10/13/21 1034 10/14/21 0143 10/14/21 1008  HGB 10.2*  --  9.9*  --  10.6*  --   HCT 32.3*  --  32.2*  --  33.6*  --   PLT 402*  --  397  --  396  --   LABPROT 22.2*  --  16.3*  --  14.2  --   INR 1.9*  --  1.3*  --  1.1  --   HEPARINUNFRC  --    < > 0.13* 0.30 0.21* 0.51  CREATININE 1.71*  --  1.43*  --  1.52*  --    < > = values in this interval not displayed.     Estimated Creatinine Clearance: 60.1 mL/min (A) (by C-G formula based on SCr of 1.52 mg/dL  (H)).  Assessment: 46 yo F presenting with worsening foot wound.  Pt on warfarin PTA for hx of VTE.  Warfarin initially held for possible procedure with VVS.  INR therapeutic on admission at 2.2 > down to 1.9 on 10/12/21 and IV heparin begun.  Warfarin restarted 1/10 with no plans for surgical intervention.  Heparin level therapeutic on 2100 units/hr. INR is down to 1.1. Patient has not complained of blood-tinged urine overnight per RN. Of note, patient is on metronidazole which can significantly interact with warfarin.    PTA warfarin regimen: 10 mg MWF, 5 mg TTSS.  Last dose 1/7.   Goal of Therapy:  Heparin level 0.3-0.7 units/ml Monitor platelets by anticoagulation protocol: Yes   Plan:  Repeat warfarin 10 mg x 1 dose today  Continue heparin at 2100 units/hr Daily heparin level, CBC and PT/INR. Monitor for any complaints of hematuria   [employer], Pharm.D., BCPS Clinical Pharmacist Clinical phone for 10/14/2021 from 7:30-3:00 is [phone].  **Pharmacist phone directory can be found on [email or web address] listed under [place].  10/14/2021 2:07 PM

## 2021-10-14 NOTE — Progress Notes (Signed)
Pharmacy Antibiotic Note  Terri Sparks is a 46 y.o. female admitted on 10/11/2021 with diabetic foot infection.  Pharmacy has been consulted for Vancomycin dosing.  Pt continues on Rocephin and Flagyl per MD as well.  Today is abx day#4.  Noted plans for 7d abx per initial consult order.  Stop date of 1/14 in place for all abx.  Plan: Rocephin 2gm IV q24h per MD Flagyl 500mg  IV q12h per MD Increase Vancomycin to 1250mg  IV q24h Expected AUC: 530 (goal 400-550) SCr used 1.52 Vd 0.5 L/kg   Height: 5\' 7"  (170.2 cm) Weight: 113.4 kg (250 lb) IBW/kg (Calculated) : 61.6  Temp (24hrs), Avg:98.2 F (36.8 C), Min:97.6 F (36.4 C), Max:98.9 F (37.2 C)  Recent Labs  Lab 10/11/21 0235 10/11/21 0901 10/12/21 0305 10/13/21 0239 10/14/21 0143  WBC 12.4*  --  10.2 9.5 9.1  CREATININE 1.59*  --  1.71* 1.43* 1.52*  LATICACIDVEN  --  0.9  --   --   --     Estimated Creatinine Clearance: 60.1 mL/min (A) (by C-G formula based on SCr of 1.52 mg/dL (H)).    Allergies  Allergen Reactions   Ciprofloxacin Hcl Hives   Dilaudid [Hydromorphone Hcl] Shortness Of Breath and Other (See Comments)    "Asystole," per pt report   Macrobid [Nitrofurantoin Macrocrystal] Hives, Shortness Of Breath and Rash   Other Hives, Shortness Of Breath and Rash    NO "-CILLINS"!!!   Penicillins Shortness Of Breath    Had had cephalosporins without incident Has patient had a PCN reaction causing immediate rash, facial/tongue/throat swelling, SOB or lightheadedness with hypotension: Yes Has patient had a PCN reaction causing severe rash involving mucus membranes or skin necrosis: Unk Has patient had a PCN reaction that required hospitalization: Unk Has patient had a PCN reaction occurring within the last 10 years: No If all of the above answers are "NO", then may proceed with Cephalosporin use.    Sulfa Antibiotics Hives, Shortness Of Breath and Rash   Lexapro [Escitalopram Oxalate] Other (See Comments)    "I just  did not like it."    Antimicrobials this admission: CTX  1/8 >> 1/14 Flagyl 1/8 >> 1/14 Vanc 1/8 >> 1/14  Dose adjustments this admission: Vancomycin dose increased 1/11 with improved renal fxn  Microbiology results: 1/8 BCx: ngtd 1/8 UCx: ?lab error   Thank you for allowing pharmacy to be a part of this patients care.  [employer], Pharm.D., BCPS Clinical Pharmacist Clinical phone for 10/14/2021 from 7:30-3:00 is [phone].  **Pharmacist phone directory can be found on [email or web address] listed under [place].  10/14/2021 10:04 AM

## 2021-10-15 ENCOUNTER — Other Ambulatory Visit (HOSPITAL_COMMUNITY): Payer: Self-pay

## 2021-10-15 DIAGNOSIS — L03116 Cellulitis of left lower limb: Secondary | ICD-10-CM | POA: Diagnosis not present

## 2021-10-15 LAB — CBC WITH DIFFERENTIAL/PLATELET
Abs Immature Granulocytes: 0.07 10*3/uL (ref 0.00–0.07)
Basophils Absolute: 0.1 10*3/uL (ref 0.0–0.1)
Basophils Relative: 1 %
Eosinophils Absolute: 0.7 10*3/uL — ABNORMAL HIGH (ref 0.0–0.5)
Eosinophils Relative: 8 %
HCT: 33.4 % — ABNORMAL LOW (ref 36.0–46.0)
Hemoglobin: 10.1 g/dL — ABNORMAL LOW (ref 12.0–15.0)
Immature Granulocytes: 1 %
Lymphocytes Relative: 29 %
Lymphs Abs: 2.7 10*3/uL (ref 0.7–4.0)
MCH: 28.5 pg (ref 26.0–34.0)
MCHC: 30.2 g/dL (ref 30.0–36.0)
MCV: 94.4 fL (ref 80.0–100.0)
Monocytes Absolute: 0.7 10*3/uL (ref 0.1–1.0)
Monocytes Relative: 7 %
Neutro Abs: 4.9 10*3/uL (ref 1.7–7.7)
Neutrophils Relative %: 54 %
Platelets: 376 10*3/uL (ref 150–400)
RBC: 3.54 MIL/uL — ABNORMAL LOW (ref 3.87–5.11)
RDW: 24 % — ABNORMAL HIGH (ref 11.5–15.5)
WBC: 9.1 10*3/uL (ref 4.0–10.5)
nRBC: 0 % (ref 0.0–0.2)

## 2021-10-15 LAB — BASIC METABOLIC PANEL
Anion gap: 10 (ref 5–15)
BUN: 18 mg/dL (ref 6–20)
CO2: 24 mmol/L (ref 22–32)
Calcium: 9.7 mg/dL (ref 8.9–10.3)
Chloride: 100 mmol/L (ref 98–111)
Creatinine, Ser: 1.57 mg/dL — ABNORMAL HIGH (ref 0.44–1.00)
GFR, Estimated: 41 mL/min — ABNORMAL LOW (ref >60)
Glucose, Bld: 203 mg/dL — ABNORMAL HIGH (ref 70–99)
Potassium: 4.4 mmol/L (ref 3.5–5.1)
Sodium: 134 mmol/L — ABNORMAL LOW (ref 135–145)

## 2021-10-15 LAB — PROTIME-INR
INR: 1.1 (ref 0.8–1.2)
Prothrombin Time: 14.5 seconds (ref 11.4–15.2)

## 2021-10-15 LAB — GLUCOSE, CAPILLARY
Glucose-Capillary: 232 mg/dL — ABNORMAL HIGH (ref 70–99)
Glucose-Capillary: 123 mg/dL — ABNORMAL HIGH (ref 70–99)

## 2021-10-15 LAB — HEPARIN LEVEL (UNFRACTIONATED): Heparin Unfractionated: 0.41 IU/mL (ref 0.30–0.70)

## 2021-10-15 MED ORDER — INSULIN GLARGINE-YFGN 100 UNIT/ML ~~LOC~~ SOLN
28.0000 [IU] | Freq: Every day | SUBCUTANEOUS | Status: DC
  Filled 2021-10-15: qty 0.28

## 2021-10-15 MED ORDER — WARFARIN SODIUM 10 MG PO TABS
10.0000 mg | ORAL_TABLET | Freq: Once | ORAL | Status: DC
Start: 2021-10-15 — End: 2021-10-15

## 2021-10-15 MED ORDER — SALINE SPRAY 0.65 % NA SOLN
1.0000 | NASAL | Status: DC | PRN
  Administered 2021-10-15: 1 via NASAL
  Filled 2021-10-15: qty 44

## 2021-10-15 MED ORDER — LINEZOLID 600 MG PO TABS
600.0000 mg | ORAL_TABLET | Freq: Two times a day (BID) | ORAL | 0 refills | Status: AC
  Filled 2021-10-15: qty 6, 3d supply, fill #0

## 2021-10-15 MED ORDER — CEFDINIR 300 MG PO CAPS
600.0000 mg | ORAL_CAPSULE | Freq: Every day | ORAL | 0 refills | Status: AC
  Filled 2021-10-15: qty 10, 5d supply, fill #0

## 2021-10-15 MED ORDER — ENOXAPARIN SODIUM 120 MG/0.8ML IJ SOSY
120.0000 mg | PREFILLED_SYRINGE | Freq: Two times a day (BID) | INTRAMUSCULAR | 0 refills | Status: AC
  Filled 2021-10-15: qty 8, 5d supply, fill #0

## 2021-10-15 MED ORDER — OXYCODONE-ACETAMINOPHEN 5-325 MG PO TABS
1.0000 | ORAL_TABLET | Freq: Three times a day (TID) | ORAL | 0 refills | Status: AC | PRN
  Filled 2021-10-15: qty 12, 4d supply, fill #0

## 2021-10-15 MED ORDER — WARFARIN SODIUM 5 MG PO TABS: 5.0000 mg | ORAL_TABLET | ORAL | Status: DC

## 2021-10-15 MED ORDER — ENOXAPARIN SODIUM 120 MG/0.8ML IJ SOSY
120.0000 mg | PREFILLED_SYRINGE | Freq: Two times a day (BID) | INTRAMUSCULAR | Status: DC
  Administered 2021-10-15: 120 mg via SUBCUTANEOUS
  Filled 2021-10-15 (×3): qty 0.8

## 2021-10-15 NOTE — Progress Notes (Signed)
PROGRESS NOTE    SHAWAN TOSH  LOV:[id number] DOB: 07-19-1976 DOA: 10/11/2021 PCP: Ladell Pier, MD   Chief Complaint  Patient presents with   Foot Injury  Brief Narrative/Hospital Course: [name], 46 y.o. 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  Hospitalized from 09/17/2021-09/26/2021 after being found to have an acute ischemic left lower extremity for which she underwent left extremity arteriogram with initiation of thrombolysis, had an incidentally noted COVID positive. During her hospitalization she reports that she had stubbed her toe on a IV pole, and it happened again when she got home bed.  She had been keeping the wound clean with antibacterial soap, peroxide, and Neosporin.  However, patient notes that her first second and third toe on the left started to turn more dark in appearance with redness on the dorsal aspect of the foot so presented and was admitted, also had blood-tinged urine and left flank pain with previous right ureteral stent in place.   Patient was admitted with mild hematuria (has R ureteral stent since July 2021), left flank pain (nothing concerning on ultrasound), and left toe infection non healing (bad vasculopath) Vascular surgery wound care following-discussed with Dr. Orlie Pollen from vascular vascular surgery okay to discharge home on oral course of antibiotics , continue wound care and outpatient follow-up with vascular  and triple anticoagulation therapy.   Subjective: Seen thsi am Resting well No new complaints   Assessment & Plan  Sepsis POA Cellulitis left lower extremity  Diabetic foot infection Left great toe dry gangrene: Sepsis  multifactorial due to cellulitis foot infection and UTI.  Presented with worsening left great toe infection-managed IV antibiotics seen by vascular, cellulitis has resolved, continue wound care on the left great toe, antibiotic changed to p.o. Zyvox and cefdinir- cont same. High risk for amputation given her PVD continue triple therapy as below.Vascular surgery has signed off  PVD S/P multiple stents on left side, on triple therapy with Coumadin aspirin Plavix at home-on last discharge on 09/19/2027 Dr. Luan Pulling recommended indefinite aspirin Plavix and Coumadin-confirmed with him again 1/11.  While INR is subtherapeutic will do Lovenox injection supply given for 5 days with instruction for daily INR monitoring at wellness center/her PCP, caseworker/TOC to arrange for transport  Anxiety and depression: Mood is stable on Klonopin, PRN Atarax.  Followed by outpatient psychiatry  Polysubstance abuse/tobacco abuse Acute metabolic encephalopathy: lethargic on admission likely polypharmacy UDS showed THC and cocaine.Mentation improved.  Mentation is stable.  Continue her home meds-klonopin, oxycodone  HTN: BP is controlled, at times soft, on amlodipine.  CKD stage IIIb: Renal function stable  Recent Labs  Lab 10/11/21 0235 10/12/21 0305 10/13/21 0239 10/14/21 0143 10/15/21 0317  BUN 13 16 15 18 18   CREATININE 1.59* 1.71* 1.43* 1.52* 1.57*    Acute lower UTI/cystitis Hematuria Right ureteral stent in place since July 2021: No evidence of pyelonephritis on renal ultrasoundUrology was consulted and discussed by previous attending and planning for probable stent removal outpatient, hematuria likely contributed by underlying stent in the setting of anticoagulation.  Continue cefdinir to complete the course. Blood culture negative so far.    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  Monitor oral intake Recent Labs  Lab 10/14/21 1127 10/14/21 1644 10/14/21 2151 10/15/21 0629 10/15/21 1127  GLUCAP 201* 201* 201* 232* 123*    Anemia of chronic disease: Hb 11 g on admission, stable, monitor transfuse for less than 7 Recent Labs  Lab 10/11/21 0235 10/12/21 0305 10/13/21 0239 10/14/21 0143 10/15/21 0317  HGB 11.1* 10.2* 9.9* 10.6* 10.1*  HCT 36.2 32.3* 32.2* 33.6* 33.4*    Class II Obesity:Patient's Body mass index is 39.16 kg/m. : Will benefit with PCP follow-up, weight loss  healthy lifestyle and outpatient sleep evaluation.  DVT prophylaxis: heparin gtt Code Status:   Code Status: Full Code Family Communication: plan of care discussed with patient at bedside. Status is: Inpatient Remains inpatient appropriate because: Ongoing management of left foot infection, transitioning to Coumadin Disposition: Currently not medically stable for discharge. Anticipated Disposition: Home once inr therapeutuic. Unable to have inr checked at coumadin clinic  Total time spent in the care of this patient 35 MINUTES Objective: Vitals last 24 hrs: Vitals:   10/14/21 0911 10/14/21 1641 10/14/21 2129 10/15/21 0923  BP: 109/61 112/66 110/64 134/74  Pulse: 87 85 84 85  Resp: 18 18 18 17   Temp: 98.1 F (36.7 C) 98 F (36.7 C) 97.9 F (36.6 C) (!) 97.4 F (36.3 C)  TempSrc: Oral Oral Oral Oral  SpO2: 96% 97% 98% 97%  Weight:      Height:       Weight change:   Intake/Output Summary (Last 24 hours) at 10/15/2021 1450 Last data filed at 10/15/2021 0849 Gross per 24 hour  Intake 1680 ml  Output --  Net 1680 ml   Net IO Since Admission: 9,894.92 mL [10/15/21 1450]   Physical Examination: General exam: AAOx 3  older than stated age, weak appearing. HEENT:Oral mucosa moist, Ear/Nose WNL grossly, dentition normal. Respiratory system: bilaterally diminished, no use of accessory muscle Cardiovascular system: S1 & S2 +, No JVD,. Gastrointestinal system: Abdomen soft, NT,ND, BS+ Nervous System:Alert, awake, moving extremities and grossly nonfocal Extremities: no edema, distal peripheral pulses palpable.  Skin: No rashes,no icterus. MSK: Normal muscle bulk,tone, power . Left foot cellulitis resolved as below   Left foot cellulitis and grt toe on presentation and now    Medications reviewed:  Scheduled Meds:  amLODipine  5 mg Oral q morning   aspirin EC  81 mg Oral Daily   atorvastatin  40 mg Oral Daily   buPROPion  150 mg Oral BID   cefdinir  600 mg Oral Daily   clopidogrel  75 mg Oral Daily   enoxaparin (LOVENOX) injection  120 mg Subcutaneous Q12H   insulin aspart  0-15 Units Subcutaneous TID WC   insulin glargine-yfgn  28 Units Subcutaneous QHS   linezolid  600 mg Oral Q12H   nicotine  21 mg Transdermal Daily   olopatadine  1 drop Both Eyes BID   sertraline  150 mg Oral q morning   sodium chloride flush  3 mL Intravenous Q12H   warfarin  10 mg Oral ONCE-1600   Warfarin - Pharmacist Dosing Inpatient   Does not apply q1600   Continuous Infusions:   Diet Order             Diet heart healthy/carb  modified Room service appropriate? Yes; Fluid consistency: Thin  Diet effective now                   Weight change:   Wt Readings from Last 3 Encounters:  10/11/21 113.4 kg  09/26/21 73 kg  07/21/21 117.4 kg     Consultants:see note  Procedures:see note Antimicrobials: Anti-infectives (From admission, onward)    Start     Dose/Rate Route Frequency Ordered Stop   10/16/21 0000  cefdinir (OMNICEF) 300 MG capsule        600 mg Oral Daily 10/15/21 1156 10/21/21 2359   10/15/21 1000  cefdinir (OMNICEF) capsule 600 mg        600 mg Oral Daily 10/14/21 1536 10/18/21 0959    10/15/21 1000  linezolid (ZYVOX) tablet 600 mg        600 mg Oral Every 12 hours 10/14/21 1536 10/18/21 0959   10/15/21 0000  linezolid (ZYVOX) 600 MG tablet        600 mg Oral Every 12 hours 10/15/21 1156 10/18/21 2359   10/14/21 1100  vancomycin (VANCOREADY) IVPB 1250 mg/250 mL  Status:  Discontinued        1,250 mg 166.7 mL/hr over 90 Minutes Intravenous Every 24 hours 10/14/21 1006 10/14/21 1536   10/12/21 1000  vancomycin (VANCOCIN) IVPB 1000 mg/200 mL premix  Status:  Discontinued        1,000 mg 200 mL/hr over 60 Minutes Intravenous Every 24 hours 10/11/21 0901 10/14/21 1006   10/11/21 0915  vancomycin (VANCOCIN) 2,250 mg in sodium chloride 0.9 % 500 mL IVPB        2,250 mg 250 mL/hr over 120 Minutes Intravenous  Once 10/11/21 0901 10/11/21 1341   10/11/21 0900  cefTRIAXone (ROCEPHIN) 2 g in sodium chloride 0.9 % 100 mL IVPB  Status:  Discontinued        2 g 200 mL/hr over 30 Minutes Intravenous Every 24 hours 10/11/21 0849 10/14/21 1536   10/11/21 0900  metroNIDAZOLE (FLAGYL) IVPB 500 mg  Status:  Discontinued        500 mg 100 mL/hr over 60 Minutes Intravenous Every 12 hours 10/11/21 0849 10/14/21 1536      Culture/Microbiology    Component Value Date/Time   SDES URINE, CLEAN CATCH 10/11/2021 2218   SPECREQUEST NONE 10/11/2021 2218   CULT (A) 10/11/2021 2218    <10,000 COLONIES/mL >=100,000 COLONIES/mL Performed at [facility], [place] [address], [place], [place] [address]    REPTSTATUS 10/14/2021 FINAL 10/11/2021 2218    Other culture-see note  Unresulted Labs (From admission, onward)     Start     Ordered   10/13/21 0500  Protime-INR  Daily,   R     Question:  Specimen collection method  Answer:  Lab=Lab collect   10/12/21 1950          Data Reviewed: I have personally reviewed following labs and imaging studies CBC: Recent Labs  Lab 10/11/21 0235 10/12/21 0305 10/13/21 0239 10/14/21 0143 10/15/21 0317  WBC 12.4* 10.2 9.5 9.1 9.1  NEUTROABS 7.7   --  5.4 5.2 4.9  HGB 11.1* 10.2* 9.9* 10.6* 10.1*  HCT 36.2 32.3* 32.2* 33.6* 33.4*  MCV 95.8 94.2 94.2 94.1 94.4  PLT 499* 402* 397 396 169   Basic Metabolic Panel: Recent Labs  Lab 10/11/21 0235 10/12/21 0305 10/13/21 0239 10/14/21 0143 10/15/21 0317  NA 135 133* 135 134* 134*  K 4.6 4.2 4.2 4.4 4.4  CL 102 100 105 103 100  CO2 25 24 22 24 24   GLUCOSE 235* 191* 157* 190* 203*  BUN 13 16 15 18 18   CREATININE 1.59* 1.71* 1.43* 1.52* 1.57*  CALCIUM 10.0 9.5 9.0 9.3 9.7  MG  --   --  1.8  --   --    GFR: Estimated Creatinine Clearance: 58.2 mL/min (A) (by C-G formula based on SCr of 1.57 mg/dL (H)). Liver Function Tests: Recent Labs  Lab 10/11/21 0235  AST 13*  ALT 14  ALKPHOS 83  BILITOT 0.4  PROT 7.8  ALBUMIN 3.1*   No results for input(s): LIPASE, AMYLASE in the last 168 hours. No results for input(s): AMMONIA in the last 168 hours. Coagulation Profile: Recent Labs  Lab 10/11/21 0235 10/12/21 0305 10/13/21 0239 10/14/21 0143 10/15/21 0317  INR 2.2* 1.9* 1.3* 1.1 1.1   Cardiac Enzymes: No results for input(s): CKTOTAL, CKMB, CKMBINDEX, TROPONINI in the last 168 hours. BNP (last 3 results) No results for input(s): PROBNP in the last 8760 hours. HbA1C: No results for input(s): HGBA1C in the last 72 hours. CBG: Recent Labs  Lab 10/14/21 1127 10/14/21 1644 10/14/21 2151 10/15/21 0629 10/15/21 1127  GLUCAP 201* 201* 201* 232* 123*   Lipid Profile: No results for input(s): CHOL, HDL, LDLCALC, TRIG, CHOLHDL, LDLDIRECT in the last 72 hours. Thyroid Function Tests: No results for input(s): TSH, T4TOTAL, FREET4, T3FREE, THYROIDAB in the last 72 hours. Anemia Panel: No results for input(s): VITAMINB12, FOLATE, FERRITIN, TIBC, IRON, RETICCTPCT in the last 72 hours. Sepsis Labs: Recent Labs  Lab 10/11/21 0901  LATICACIDVEN 0.9    Recent Results (from the past 240 hour(s))  Blood Cultures x 2 sites     Status: None (Preliminary result)   Collection  Time: 10/11/21  8:05 AM   Specimen: BLOOD  Result Value Ref Range Status   Specimen Description BLOOD RIGHT ANTECUBITAL  Final   Special Requests   Final    BOTTLES DRAWN AEROBIC AND ANAEROBIC Blood Culture adequate volume   Culture   Final    NO GROWTH 4 DAYS Performed at [facility], [address], [place], [place] [address]    Report Status PENDING  Incomplete  Blood Cultures x 2 sites     Status: None (Preliminary result)   Collection Time: 10/11/21  9:15 AM   Specimen: BLOOD LEFT FOREARM  Result Value Ref Range Status   Specimen Description BLOOD LEFT FOREARM  Final   Special Requests   Final    BOTTLES DRAWN AEROBIC AND ANAEROBIC Blood Culture adequate volume   Culture   Final    NO GROWTH 4 DAYS Performed at [facility], [place] [address], [place], [place] [address]    Report Status PENDING  Incomplete  Resp Panel by RT-PCR (Flu A&B, Covid) Nasopharyngeal Swab     Status: None   Collection Time: 10/11/21  9:31 AM   Specimen: Nasopharyngeal Swab; Nasopharyngeal(NP) swabs in vial transport medium  Result Value Ref Range Status   SARS Coronavirus 2 by RT PCR NEGATIVE NEGATIVE Final    Comment: (NOTE) SARS-CoV-2 target nucleic acids are NOT DETECTED.  The SARS-CoV-2 RNA is generally detectable in upper respiratory specimens during the acute phase of infection. The lowest concentration of SARS-CoV-2 viral copies this assay can detect is 138 copies/mL. A negative result does not preclude SARS-Cov-2 infection and should not be used as the sole basis for treatment or other patient management decisions. A negative result may  occur with  improper specimen collection/handling, submission of specimen other than nasopharyngeal swab, presence of viral mutation(s) within the areas targeted by this assay, and inadequate number of viral copies(<138 copies/mL). A negative result must be combined with clinical observations, patient history, and epidemiological information.  The expected result is Negative.  Fact Sheet for Patients:  EntrepreneurPulse.com.au  Fact Sheet for Healthcare Providers:  IncredibleEmployment.be  This test is no t yet approved or cleared by the [place] FDA and  has been authorized for detection and/or diagnosis of SARS-CoV-2 by FDA under an Emergency Use Authorization (EUA). This EUA will remain  in effect (meaning this test can be used) for the duration of the COVID-19 declaration under Section 564(b)(1) of the Act, 21 U.S.C.section 360bbb-3(b)(1), unless the authorization is terminated  or revoked sooner.       Influenza A by PCR NEGATIVE NEGATIVE Final   Influenza B by PCR NEGATIVE NEGATIVE Final    Comment: (NOTE) The Xpert Xpress SARS-CoV-2/FLU/RSV plus assay is intended as an aid in the diagnosis of influenza from Nasopharyngeal swab specimens and should not be used as a sole basis for treatment. Nasal washings and aspirates are unacceptable for Xpert Xpress SARS-CoV-2/FLU/RSV testing.  Fact Sheet for Patients: EntrepreneurPulse.com.au  Fact Sheet for Healthcare Providers: IncredibleEmployment.be  This test is not yet approved or cleared by the [place] FDA and has been authorized for detection and/or diagnosis of SARS-CoV-2 by FDA under an Emergency Use Authorization (EUA). This EUA will remain in effect (meaning this test can be used) for the duration of the COVID-19 declaration under Section 564(b)(1) of the Act, 21 U.S.C. section 360bbb-3(b)(1), unless the authorization is terminated or revoked.  Performed at [facility], [place] [address], [place], [place] [address]   Urine Culture     Status: Abnormal   Collection Time: 10/11/21 10:18 PM   Specimen: Urine, Clean Catch  Result Value Ref Range Status   Specimen Description URINE, CLEAN CATCH  Final   Special Requests NONE  Final   Culture (A)  Final    <10,000  COLONIES/mL >=100,000 COLONIES/mL Performed at [facility], [place] [address], [place], [place] [address]    Report Status 10/14/2021 FINAL  Final     Radiology Studies: No results found.   LOS: 4 days   [name], MD [facility]  10/15/2021, 2:50 PM

## 2021-10-15 NOTE — Telephone Encounter (Signed)
Copied from [place] [id number]. Topic: Appointment Scheduling - Scheduling Inquiry for Clinic >> Oct 15, 2021  1:48 PM P[place], Apolonio Schneiders wrote: Reason for CRM: Darnelle Bos [phone] was calling to schedule an appt for the Coumadin clinic, please advise.  I return Pt call, try to schedule but she want to be see next week, senda msg to [facility] for him to call her

## 2021-10-15 NOTE — Consult Note (Signed)
° °  [facility] CM Inpatient Consult   10/15/2021  Terri Sparks [birth year] [id number]  Managed Medicaid:  [employer]  Primary Care Provider:  Ladell Pier, MD, [facility]   Patient screened for less than 30 days readmission hospitalization with noted extreme high risk score for unplanned readmission risk and  to assess for potential [place] Medicaid Care Management service needs for post hospital transition.  Review of patient's medical record reveals patient is for home.  Call attempts x 2 to speak with patient regarding post hospital needs. No answer.   Plan:  Will place a referral request for MM for disease management needs for post hospital transition.  For questions contact:   Natividad Brood, RN BSN [facility] Liaison  [phone] business mobile phone Toll free office [phone]  Fax number: [phone] [email or web address]

## 2021-10-15 NOTE — TOC Progression Note (Signed)
Transition of Care [facility]) - Initial/Assessment Note    Patient Details  Name: Terri Sparks MRN: [id number] Date of Birth: [birth year]  Transition of Care [facility]) CM/SW Contact:    Milinda Antis, [place] Phone Number: 10/15/2021, 10:30 AM  Clinical Narrative:                 CSW met with the patient at bedside.  The patient reports that she receives care at the [facility] and that a doctor named "Lurena Joiner" checks her INR there.    CSW contacted the community health and wellness clinic and asked if the patient could be scheduled for appointments everyday for the next four days for the patient.  The facility does not have an opening until 10/27/2021.  RNCM notified and will follow up.          Patient Goals and CMS Choice        Expected Discharge Plan and Services                                                Prior Living Arrangements/Services                       Activities of Daily Living Home Assistive Devices/Equipment: Gilford Rile (specify type) ADL Screening (condition at time of admission) Patient's cognitive ability adequate to safely complete daily activities?: Yes Is the patient deaf or have difficulty hearing?: No Does the patient have difficulty seeing, even when wearing glasses/contacts?: No Does the patient have difficulty concentrating, remembering, or making decisions?: No Patient able to express need for assistance with ADLs?: Yes Does the patient have difficulty dressing or bathing?: No Independently performs ADLs?: Yes (appropriate for developmental age) Does the patient have difficulty walking or climbing stairs?: No Weakness of Legs: Left Weakness of Arms/Hands: None  Permission Sought/Granted                  Emotional Assessment              Admission diagnosis:  Pyelonephritis [N12] Cellulitis of right lower extremity [L03.115] Urinary tract infection without hematuria, site unspecified  [N39.0] Critical limb ischemia of left lower extremity ([place]) [I70.222] Patient Active Problem List   Diagnosis Date Noted   Critical limb ischemia of left lower extremity ([place]) 10/11/2021   Cellulitis 10/11/2021   Diabetic foot infection ([place]) 10/11/2021   Anemia 10/11/2021   COVID-19 virus infection 09/23/2021   Critical lower limb ischemia ([place]) 09/17/2021   Major depressive disorder, recurrent episode with anxious distress ([place]) 02/17/2021   Class 3 severe obesity due to excess calories with serious comorbidity and body mass index (BMI) of 40.0 to 44.9 in adult ([place]) 02/17/2021   Hypercoagulable state ([place]) 02/17/2021   Hyperlipidemia associated with type 2 diabetes mellitus ([place]) 01/08/2021   Critical limb ischemia with history of revascularization of same extremity ([place]) 12/27/2020   Cocaine use disorder, moderate, dependence ([place]) 07/16/2020   Cannabis use disorder, moderate, dependence ([place]) 07/16/2020   Homeless 11/13/2019   Chest pain at rest 09/24/2019   Polysubstance abuse ([place]) 09/23/2019   Stage 3b chronic kidney disease (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[place]) 02/25/2018   Thromboembolism ([place]) 02/25/2018   Microalbuminuria 09/29/2017   Insomnia 09/29/2017   Anxiety and depression 04/28/2017   Neuropathy of left lower extremity 04/28/2017   Iliac artery occlusion, left ([place]) 02/25/2017   Tobacco abuse  02/25/2017   Insulin-requiring or dependent type II diabetes mellitus ([place]) 02/25/2017   Peripheral vascular disease of lower extremity ([place]) 02/24/2017   GERD (gastroesophageal reflux disease)    Hyperparathyroidism    Anxiety state    PCP:  Ladell Pier, MD Pharmacy:   [place], [place] - [address] [place] [address] Phone: [phone] Fax: [place], [place] - [address] [place] [address] Phone: [phone] Fax: [place], [place] [address] [place] [address] Phone: [phone] Fax: [place], [place] - [address] [place] [address] Phone: [phone] Fax: [phone]     Social Determinants of Health (SDOH) Interventions    Readmission Risk Interventions Readmission Risk Prevention Plan 09/25/2021 01/04/2021  Transportation Screening Complete Complete  Medication Review ([place]) Complete Complete  PCP or Specialist appointment within 3-5 days of discharge - Complete  HRI or [place] Complete Complete  SW Recovery Care/Counseling Consult Complete Complete  Palliative Care Screening Not Applicable Complete  [place] Not Applicable Not Applicable  Some recent data might be hidden

## 2021-10-15 NOTE — Progress Notes (Signed)
Patient has decided to leave AMA. Risks discussed with patient, including that per MD, patient is at risk of losing circulation to her leg if she discontinues lovenox, however due to risk of bleeding and complications MD cannot prescribe lovenox without follow up appointments for lab draws scheduled.   Patient verbalizes understanding. Paperwork signed and in the chart.

## 2021-10-15 NOTE — Progress Notes (Signed)
CM notified by MD and RN that patient needs appointment schedule for outpatient INR checks for her coumadin/Lovenox. CM called patient's PCP and was told that they will not be able to schedule her till January 30th. MD notified. CM called [facility] and was told since patient is not active with their service, they could not do her INR checks.  CM notified by RN that patient wants to leave AMA.CM went to speak with patient and she was adamant on going home. Risks explained to patient about being therapeutic and compliance but patient insists on going home. RN gave patient  AMA form and patient signed it. Left unit with her belongings and son at side. No further TOC needs noted.

## 2021-10-15 NOTE — Discharge Summary (Signed)
AMA  Patient at this time expresses desire to leave the Hospital immidiately, patient has been warned that this is not Medically advisable at this time, and can result in Medical complications like Death and Disability, patient understands and accepts the risks involved and assumes full responsibilty of this decision. I made my best effort to convince him to stay. I spent several minutes explaining her that she cannot go home while INR is < 2 as we are not sure if she will get INR monitored closely. I explained to her taht she risks  blockage in blood flod and risk losing her log if she leaves without blood thinners. She verbalized understanding and wants to leave AMA immediately. RN also tried to [employer] her but she is adamant on going home- she stated she is going to walk into her PCP/coumadin clinic tomorrow morning- she has lovenox shot one box and coumadin and she will take  both until INR is close to 2 and she will see her PCP or coumadin clinic tomorrow and have them check her INR there. She told me if there is cancellation her pcp office told her she will get to se MD if not then CNA/lab draws  [name] M.D on 10/15/2021 at 4:07 PM  [facility]  Time < 30 minutes  Last Note Below   PROGRESS NOTE    Terri Sparks  EXH:[id number] DOB: 06/01/1976 DOA: 10/11/2021 PCP: Ladell Pier, MD   Chief Complaint  Patient presents with   Foot Injury  Brief Narrative/Hospital Course: Terri Sparks, 46 y.o. 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  Hospitalized from 09/17/2021-09/26/2021 after being found to have an acute ischemic left lower extremity for which she underwent left extremity arteriogram with initiation of thrombolysis, had an incidentally noted COVID positive. During her hospitalization she reports that she had stubbed her toe on a IV pole, and it happened again when she got home bed.  She had been keeping the wound clean with antibacterial soap, peroxide, and Neosporin.  However, patient notes that her first second and third toe on the left started to turn more dark in appearance with redness on the dorsal aspect of the foot so presented and was admitted, also had blood-tinged urine and left flank pain with previous right ureteral stent in place.   Patient was admitted with mild hematuria (has R ureteral stent since July 2021), left flank pain (nothing concerning on ultrasound), and left toe infection non healing (bad vasculopath) Vascular surgery wound care following-discussed with Dr. Orlie Pollen from vascular vascular surgery okay to discharge home on oral course of antibiotics , continue wound care and outpatient follow-up with vascular  and triple anticoagulation therapy.   Subjective: Seen thsi am Resting well No new complaints   Assessment & Plan  Sepsis POA Cellulitis left lower extremity  Diabetic foot infection Left great toe dry gangrene: Sepsis multifactorial due to cellulitis foot infection and UTI.  Presented  with worsening left great toe infection-managed IV antibiotics seen by vascular, cellulitis has resolved, continue wound care on the left great toe, antibiotic changed to p.o. Zyvox and cefdinir- cont same. High risk for amputation given her PVD continue triple therapy as below.Vascular surgery has signed off  PVD S/P multiple stents on left side, on triple therapy with Coumadin aspirin Plavix at home-on last discharge on 09/19/2027 Dr. Luan Pulling recommended indefinite aspirin Plavix and  Coumadin-confirmed with him again 1/11.  While INR is subtherapeutic will do Lovenox injection supply given for 5 days with instruction for daily INR monitoring at wellness center/her PCP, caseworker/TOC to arrange for transport  Anxiety and depression: Mood is stable on Klonopin, PRN Atarax.  Followed by outpatient psychiatry  Polysubstance abuse/tobacco abuse Acute metabolic encephalopathy: lethargic on admission likely polypharmacy UDS showed THC and cocaine.Mentation improved.  Mentation is stable.  Continue her home meds-klonopin, oxycodone  HTN: BP is controlled, at times soft, on amlodipine.  CKD stage IIIb: Renal function stable  Recent Labs  Lab 10/11/21 0235 10/12/21 0305 10/13/21 0239 10/14/21 0143 10/15/21 0317  BUN 13 16 15 18 18   CREATININE 1.59* 1.71* 1.43* 1.52* 1.57*    Acute lower UTI/cystitis Hematuria Right ureteral stent in place since July 2021: No evidence of pyelonephritis on renal ultrasoundUrology was consulted and discussed by previous attending and planning for probable stent removal outpatient, hematuria likely contributed by underlying stent in the setting of anticoagulation.  Continue cefdinir to complete the course. Blood culture negative so far.    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  Monitor oral intake Recent Labs  Lab 10/14/21 1127 10/14/21 1644 10/14/21 2151 10/15/21 0629 10/15/21 1127  GLUCAP 201* 201* 201* 232* 123*    Anemia of chronic disease: Hb 11 g on admission, stable, monitor transfuse for less than 7 Recent Labs  Lab 10/11/21 0235 10/12/21 0305  10/13/21 0239 10/14/21 0143 10/15/21 0317  HGB 11.1* 10.2* 9.9* 10.6* 10.1*  HCT 36.2 32.3* 32.2* 33.6* 33.4*    Class II Obesity:Patient's Body mass index is 39.16 kg/m. : Will benefit with PCP follow-up, weight loss  healthy lifestyle and outpatient sleep evaluation.  DVT prophylaxis: heparin gtt Code Status:   Code Status: Full Code Family Communication: plan of care discussed with patient at bedside. Status is: Inpatient Remains inpatient appropriate because: Ongoing management of left foot infection, transitioning to Coumadin Disposition: Currently not medically stable for discharge. Anticipated Disposition: Home once inr therapeutuic. Unable to have inr checked at coumadin clinic  Total time spent in the care of this patient 35 MINUTES Objective: Vitals last 24 hrs: Vitals:   10/14/21 0911 10/14/21 1641 10/14/21 2129 10/15/21 0923  BP: 109/61 112/66 110/64 134/74  Pulse: 87 85 84 85  Resp: 18 18 18 17   Temp: 98.1 F (36.7 C) 98 F (36.7 C) 97.9 F (36.6 C) (!) 97.4 F (36.3 C)  TempSrc: Oral Oral Oral Oral  SpO2: 96% 97% 98% 97%  Weight:      Height:       Weight change:   Intake/Output Summary (Last 24 hours) at 10/15/2021 1607 Last data filed at 10/15/2021 0849 Gross per 24  hour  Intake 1680 ml  Output --  Net 1680 ml   Net IO Since Admission: 9,894.92 mL [10/15/21 1607]   Physical Examination: General exam: AAOx 3 older than stated age, weak appearing. HEENT:Oral mucosa moist, Ear/Nose WNL grossly, dentition normal. Respiratory system: bilaterally diminished, no use of accessory muscle Cardiovascular system: S1 & S2 +, No JVD,. Gastrointestinal system: Abdomen soft, NT,ND, BS+ Nervous System:Alert, awake, moving extremities and grossly nonfocal Extremities: no edema, distal peripheral pulses palpable.  Skin: No rashes,no icterus. MSK: Normal muscle bulk,tone, power . Left foot cellulitis resolved as below   Left foot cellulitis and grt toe on  presentation and now    Medications reviewed:  Scheduled Meds:  amLODipine  5 mg Oral q morning   aspirin EC  81 mg Oral Daily   atorvastatin  40 mg Oral Daily   buPROPion  150 mg Oral BID   cefdinir  600 mg Oral Daily   clopidogrel  75 mg Oral Daily   enoxaparin (LOVENOX) injection  120 mg Subcutaneous Q12H   insulin aspart  0-15 Units Subcutaneous TID WC   insulin glargine-yfgn  28 Units Subcutaneous QHS   linezolid  600 mg Oral Q12H   nicotine  21 mg Transdermal Daily   olopatadine  1 drop Both Eyes BID   sertraline  150 mg Oral q morning   sodium chloride flush  3 mL Intravenous Q12H   warfarin  10 mg Oral ONCE-1600   Warfarin - Pharmacist Dosing Inpatient   Does not apply q1600   Continuous Infusions:   Diet Order             Diet heart healthy/carb modified Room service appropriate? Yes; Fluid consistency: Thin  Diet effective now                   Weight change:   Wt Readings from Last 3 Encounters:  10/11/21 113.4 kg  09/26/21 73 kg  07/21/21 117.4 kg     Consultants:see note  Procedures:see note Antimicrobials: Anti-infectives (From admission, onward)    Start     Dose/Rate Route Frequency Ordered Stop   10/16/21 0000  cefdinir (OMNICEF) 300 MG capsule        600 mg Oral Daily 10/15/21 1156 10/21/21 2359   10/15/21 1000  cefdinir (OMNICEF) capsule 600 mg        600 mg Oral Daily 10/14/21 1536 10/18/21 0959   10/15/21 1000  linezolid (ZYVOX) tablet 600 mg        600 mg Oral Every 12 hours 10/14/21 1536 10/18/21 0959   10/15/21 0000  linezolid (ZYVOX) 600 MG tablet        600 mg Oral Every 12 hours 10/15/21 1156 10/18/21 2359   10/14/21 1100  vancomycin (VANCOREADY) IVPB 1250 mg/250 mL  Status:  Discontinued        1,250 mg 166.7 mL/hr over 90 Minutes Intravenous Every 24 hours 10/14/21 1006 10/14/21 1536   10/12/21 1000  vancomycin (VANCOCIN) IVPB 1000 mg/200 mL premix  Status:  Discontinued        1,000 mg 200 mL/hr over 60 Minutes Intravenous  Every 24 hours 10/11/21 0901 10/14/21 1006   10/11/21 0915  vancomycin (VANCOCIN) 2,250 mg in sodium chloride 0.9 % 500 mL IVPB        2,250 mg 250 mL/hr over 120 Minutes Intravenous  Once 10/11/21 0901 10/11/21 1341   10/11/21 0900  cefTRIAXone (ROCEPHIN) 2 g in sodium chloride 0.9 % 100 mL IVPB  Status:  Discontinued        2 g 200 mL/hr over 30 Minutes Intravenous Every 24 hours 10/11/21 0849 10/14/21 1536   10/11/21 0900  metroNIDAZOLE (FLAGYL) IVPB 500 mg  Status:  Discontinued        500 mg 100 mL/hr over 60 Minutes Intravenous Every 12 hours 10/11/21 0849 10/14/21 1536      Culture/Microbiology    Component Value Date/Time   SDES URINE, CLEAN CATCH 10/11/2021 2218   SPECREQUEST NONE 10/11/2021 2218   CULT (A) 10/11/2021 2218    <10,000 COLONIES/mL >=100,000 COLONIES/mL Performed at [facility], [place] [address], [place], [place] [address]    REPTSTATUS 10/14/2021 FINAL 10/11/2021 2218    Other culture-see note  Unresulted Labs (From admission, onward)     Start     Ordered   10/13/21 0500  Protime-INR  Daily,   R     Question:  Specimen collection method  Answer:  Lab=Lab collect   10/12/21 1950          Data Reviewed: I have personally reviewed following labs and imaging studies CBC: Recent Labs  Lab 10/11/21 0235 10/12/21 0305 10/13/21 0239 10/14/21 0143 10/15/21 0317  WBC 12.4* 10.2 9.5 9.1 9.1  NEUTROABS 7.7  --  5.4 5.2 4.9  HGB 11.1* 10.2* 9.9* 10.6* 10.1*  HCT 36.2 32.3* 32.2* 33.6* 33.4*  MCV 95.8 94.2 94.2 94.1 94.4  PLT 499* 402* 397 396 945   Basic Metabolic Panel: Recent Labs  Lab 10/11/21 0235 10/12/21 0305 10/13/21 0239 10/14/21 0143 10/15/21 0317  NA 135 133* 135 134* 134*  K 4.6 4.2 4.2 4.4 4.4  CL 102 100 105 103 100  CO2 25 24 22 24 24   GLUCOSE 235* 191* 157* 190* 203*  BUN 13 16 15 18 18   CREATININE 1.59* 1.71* 1.43* 1.52* 1.57*  CALCIUM 10.0 9.5 9.0 9.3 9.7  MG  --   --  1.8  --   --    GFR: Estimated Creatinine  Clearance: 58.2 mL/min (A) (by C-G formula based on SCr of 1.57 mg/dL (H)). Liver Function Tests: Recent Labs  Lab 10/11/21 0235  AST 13*  ALT 14  ALKPHOS 83  BILITOT 0.4  PROT 7.8  ALBUMIN 3.1*   No results for input(s): LIPASE, AMYLASE in the last 168 hours. No results for input(s): AMMONIA in the last 168 hours. Coagulation Profile: Recent Labs  Lab 10/11/21 0235 10/12/21 0305 10/13/21 0239 10/14/21 0143 10/15/21 0317  INR 2.2* 1.9* 1.3* 1.1 1.1   Cardiac Enzymes: No results for input(s): CKTOTAL, CKMB, CKMBINDEX, TROPONINI in the last 168 hours. BNP (last 3 results) No results for input(s): PROBNP in the last 8760 hours. HbA1C: No results for input(s): HGBA1C in the last 72 hours. CBG: Recent Labs  Lab 10/14/21 1127 10/14/21 1644 10/14/21 2151 10/15/21 0629 10/15/21 1127  GLUCAP 201* 201* 201* 232* 123*   Lipid Profile: No results for input(s): CHOL, HDL, LDLCALC, TRIG, CHOLHDL, LDLDIRECT in the last 72 hours. Thyroid Function Tests: No results for input(s): TSH, T4TOTAL, FREET4, T3FREE, THYROIDAB in the last 72 hours. Anemia Panel: No results for input(s): VITAMINB12, FOLATE, FERRITIN, TIBC, IRON, RETICCTPCT in the last 72 hours. Sepsis Labs: Recent Labs  Lab 10/11/21 0901  LATICACIDVEN 0.9    Recent Results (from the past 240 hour(s))  Blood Cultures x 2 sites     Status: None (Preliminary result)   Collection Time: 10/11/21  8:05 AM   Specimen: BLOOD  Result  Value Ref Range Status   Specimen Description BLOOD RIGHT ANTECUBITAL  Final   Special Requests   Final    BOTTLES DRAWN AEROBIC AND ANAEROBIC Blood Culture adequate volume   Culture   Final    NO GROWTH 4 DAYS Performed at [facility], [address], [place], [place] [address]    Report Status PENDING  Incomplete  Blood Cultures x 2 sites     Status: None (Preliminary result)   Collection Time: 10/11/21  9:15 AM   Specimen: BLOOD LEFT FOREARM  Result Value Ref Range Status    Specimen Description BLOOD LEFT FOREARM  Final   Special Requests   Final    BOTTLES DRAWN AEROBIC AND ANAEROBIC Blood Culture adequate volume   Culture   Final    NO GROWTH 4 DAYS Performed at [facility], [place] [address] [place], [place] [address]    Report Status PENDING  Incomplete  Resp Panel by RT-PCR (Flu A&B, Covid) Nasopharyngeal Swab     Status: None   Collection Time: 10/11/21  9:31 AM   Specimen: Nasopharyngeal Swab; Nasopharyngeal(NP) swabs in vial transport medium  Result Value Ref Range Status   SARS Coronavirus 2 by RT PCR NEGATIVE NEGATIVE Final    Comment: (NOTE) SARS-CoV-2 target nucleic acids are NOT DETECTED.  The SARS-CoV-2 RNA is generally detectable in upper respiratory specimens during the acute phase of infection. The lowest concentration of SARS-CoV-2 viral copies this assay can detect is 138 copies/mL. A negative result does not preclude SARS-Cov-2 infection and should not be used as the sole basis for treatment or other patient management decisions. A negative result may occur with  improper specimen collection/handling, submission of specimen other than nasopharyngeal swab, presence of viral mutation(s) within the areas targeted by this assay, and inadequate number of viral copies(<138 copies/mL). A negative result must be combined with clinical observations, patient history, and epidemiological information. The expected result is Negative.  Fact Sheet for Patients:  EntrepreneurPulse.com.au  Fact Sheet for Healthcare Providers:  IncredibleEmployment.be  This test is no t yet approved or cleared by the [place] FDA and  has been authorized for detection and/or diagnosis of SARS-CoV-2 by FDA under an Emergency Use Authorization (EUA). This EUA will remain  in effect (meaning this test can be used) for the duration of the COVID-19 declaration under Section 564(b)(1) of the Act, 21 U.S.C.section  360bbb-3(b)(1), unless the authorization is terminated  or revoked sooner.       Influenza A by PCR NEGATIVE NEGATIVE Final   Influenza B by PCR NEGATIVE NEGATIVE Final    Comment: (NOTE) The Xpert Xpress SARS-CoV-2/FLU/RSV plus assay is intended as an aid in the diagnosis of influenza from Nasopharyngeal swab specimens and should not be used as a sole basis for treatment. Nasal washings and aspirates are unacceptable for Xpert Xpress SARS-CoV-2/FLU/RSV testing.  Fact Sheet for Patients: EntrepreneurPulse.com.au  Fact Sheet for Healthcare Providers: IncredibleEmployment.be  This test is not yet approved or cleared by the [place] FDA and has been authorized for detection and/or diagnosis of SARS-CoV-2 by FDA under an Emergency Use Authorization (EUA). This EUA will remain in effect (meaning this test can be used) for the duration of the COVID-19 declaration under Section 564(b)(1) of the Act, 21 U.S.C. section 360bbb-3(b)(1), unless the authorization is terminated or revoked.  Performed at [facility], [place] [address], [place], [place] [address]   Urine Culture     Status: Abnormal  Collection Time: 10/11/21 10:18 PM   Specimen: Urine, Clean Catch  Result Value Ref Range Status   Specimen Description URINE, CLEAN CATCH  Final   Special Requests NONE  Final   Culture (A)  Final    <10,000 COLONIES/mL >=100,000 COLONIES/mL Performed at [facility], [address] [place], [place] [address]    Report Status 10/14/2021 FINAL  Final     Radiology Studies: No results found.   LOS: 4 days   [name], MD [facility]  10/15/2021, 4:07 PM

## 2021-10-15 NOTE — Progress Notes (Signed)
Music therapist aware that patient was planning to leave AMA. Charge RN spoke with the patient and her son at the bedside. Prescriptions were returned to [place].   Patient left the floor with her son, in no apparent distress.

## 2021-10-15 NOTE — Progress Notes (Deleted)
AMA  Patient at this time expresses desire to leave the Hospital immidiately, patient has been warned that this is not Medically advisable at this time, and can result in Medical complications like Death and Disability, patient understands and accepts the risks involved and assumes full responsibilty of this decision. I made my best effort to convince him to stay. I spent several minutes explaining her that she cannot go home while INR is < 2 as we are not sure if she will get INR monitored closely. I explained to her taht she risks  blockage in blood flod and risk losing her log if she leaves without blood thinners. She verbalized understanding and wants to leave AMA immediately. RN also tried to [employer] her but she is adamant on going home- she stated she is going to walk into her PCP/coumadin clinic tomorrow morning- she has lovenox shot one box and coumadin and she will take  both until INR is close to 2 and she will see her PCP or coumadin clinic tomorrow and have them check her INR there. She told me if there is cancellation her pcp office told her she will get to se MD if not then CNA/lab draws  Terri Sparks M.D on 10/15/2021 at 4:01 PM  [facility]  Time < 30 minutes  Last Note Below   PROGRESS NOTE    Terri Sparks  YFV:[id number] DOB: 02/07/1976 DOA: 10/11/2021 PCP: Terri Pier, MD   Chief Complaint  Patient presents with   Foot Injury  Brief Narrative/Hospital Course: Terri Sparks, 46 y.o. 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  Hospitalized from 09/17/2021-09/26/2021 after being found to have an acute ischemic left lower extremity for which she underwent left extremity arteriogram with initiation of thrombolysis, had an incidentally noted COVID positive. During her hospitalization she reports that she had stubbed her toe on a IV pole, and it happened again when she got home bed.  She had been keeping the wound clean with antibacterial soap, peroxide, and Neosporin.  However, patient notes that her first second and third toe on the left started to turn more dark in appearance with redness on the dorsal aspect of the foot so presented and was admitted, also had blood-tinged urine and left flank pain with previous right ureteral stent in place.   Patient was admitted with mild hematuria (has R ureteral stent since July 2021), left flank pain (nothing concerning on ultrasound), and left toe infection non healing (bad vasculopath) Vascular surgery wound care following-discussed with Terri Sparks from vascular vascular surgery okay to discharge home on oral course of antibiotics , continue wound care and outpatient follow-up with vascular  and triple anticoagulation therapy.   Subjective: Seen thsi am Resting well No new complaints   Assessment & Plan  Sepsis POA Cellulitis left lower extremity  Diabetic foot infection Left great toe dry gangrene: Sepsis multifactorial due to cellulitis foot infection and UTI.  Presented  with worsening left great toe infection-managed IV antibiotics seen by vascular, cellulitis has resolved, continue wound care on the left great toe, antibiotic changed to p.o. Zyvox and cefdinir- cont same. High risk for amputation given her PVD continue triple therapy as below.Vascular surgery has signed off  PVD S/P multiple stents on left side, on triple therapy with Coumadin aspirin Plavix at home-on last discharge on 09/19/2027 Dr. Luan Sparks recommended indefinite aspirin Plavix and  Coumadin-confirmed with him again 1/11.  While INR is subtherapeutic will do Lovenox injection supply given for 5 days with instruction for daily INR monitoring at wellness center/her PCP, caseworker/TOC to arrange for transport  Anxiety and depression: Mood is stable on Klonopin, PRN Atarax.  Followed by outpatient psychiatry  Polysubstance abuse/tobacco abuse Acute metabolic encephalopathy: lethargic on admission likely polypharmacy UDS showed THC and cocaine.Mentation improved.  Mentation is stable.  Continue her home meds-klonopin, oxycodone  HTN: BP is controlled, at times soft, on amlodipine.  CKD stage IIIb: Renal function stable  Recent Labs  Lab 10/11/21 0235 10/12/21 0305 10/13/21 0239 10/14/21 0143 10/15/21 0317  BUN 13 16 15 18 18   CREATININE 1.59* 1.71* 1.43* 1.52* 1.57*    Acute lower UTI/cystitis Hematuria Right ureteral stent in place since July 2021: No evidence of pyelonephritis on renal ultrasoundUrology was consulted and discussed by previous attending and planning for probable stent removal outpatient, hematuria likely contributed by underlying stent in the setting of anticoagulation.  Continue cefdinir to complete the course. Blood culture negative so far.    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  Monitor oral intake Recent Labs  Lab 10/14/21 1127 10/14/21 1644 10/14/21 2151 10/15/21 0629 10/15/21 1127  GLUCAP 201* 201* 201* 232* 123*    Anemia of chronic disease: Hb 11 g on admission, stable, monitor transfuse for less than 7 Recent Labs  Lab 10/11/21 0235 10/12/21 0305  10/13/21 0239 10/14/21 0143 10/15/21 0317  HGB 11.1* 10.2* 9.9* 10.6* 10.1*  HCT 36.2 32.3* 32.2* 33.6* 33.4*    Class II Obesity:Patient's Body mass index is 39.16 kg/m. : Will benefit with PCP follow-up, weight loss  healthy lifestyle and outpatient sleep evaluation.  DVT prophylaxis: heparin gtt Code Status:   Code Status: Full Code Family Communication: plan of care discussed with patient at bedside. Status is: Inpatient Remains inpatient appropriate because: Ongoing management of left foot infection, transitioning to Coumadin Disposition: Currently not medically stable for discharge. Anticipated Disposition: Home once inr therapeutuic. Unable to have inr checked at coumadin clinic  Total time spent in the care of this patient 35 MINUTES Objective: Vitals last 24 hrs: Vitals:   10/14/21 0911 10/14/21 1641 10/14/21 2129 10/15/21 0923  BP: 109/61 112/66 110/64 134/74  Pulse: 87 85 84 85  Resp: 18 18 18 17   Temp: 98.1 F (36.7 C) 98 F (36.7 C) 97.9 F (36.6 C) (!) 97.4 F (36.3 C)  TempSrc: Oral Oral Oral Oral  SpO2: 96% 97% 98% 97%  Weight:      Height:       Weight change:   Intake/Output Summary (Last 24 hours) at 10/15/2021 1607 Last data filed at 10/15/2021 0849 Gross per 24  hour  Intake 1680 ml  Output --  Net 1680 ml   Net IO Since Admission: 9,894.92 mL [10/15/21 1607]   Physical Examination: General exam: AAOx 3 older than stated age, weak appearing. HEENT:Oral mucosa moist, Ear/Nose WNL grossly, dentition normal. Respiratory system: bilaterally diminished, no use of accessory muscle Cardiovascular system: S1 & S2 +, No JVD,. Gastrointestinal system: Abdomen soft, NT,ND, BS+ Nervous System:Alert, awake, moving extremities and grossly nonfocal Extremities: no edema, distal peripheral pulses palpable.  Skin: No rashes,no icterus. MSK: Normal muscle bulk,tone, power . Left foot cellulitis resolved as below   Left foot cellulitis and grt toe on  presentation and now    Medications reviewed:  Scheduled Meds:  amLODipine  5 mg Oral q morning   aspirin EC  81 mg Oral Daily   atorvastatin  40 mg Oral Daily   buPROPion  150 mg Oral BID   cefdinir  600 mg Oral Daily   clopidogrel  75 mg Oral Daily   enoxaparin (LOVENOX) injection  120 mg Subcutaneous Q12H   insulin aspart  0-15 Units Subcutaneous TID WC   insulin glargine-yfgn  28 Units Subcutaneous QHS   linezolid  600 mg Oral Q12H   nicotine  21 mg Transdermal Daily   olopatadine  1 drop Both Eyes BID   sertraline  150 mg Oral q morning   sodium chloride flush  3 mL Intravenous Q12H   warfarin  10 mg Oral ONCE-1600   Warfarin - Pharmacist Dosing Inpatient   Does not apply q1600   Continuous Infusions:   Diet Order             Diet heart healthy/carb modified Room service appropriate? Yes; Fluid consistency: Thin  Diet effective now                   Weight change:   Wt Readings from Last 3 Encounters:  10/11/21 113.4 kg  09/26/21 73 kg  07/21/21 117.4 kg     Consultants:see note  Procedures:see note Antimicrobials: Anti-infectives (From admission, onward)    Start     Dose/Rate Route Frequency Ordered Stop   10/16/21 0000  cefdinir (OMNICEF) 300 MG capsule        600 mg Oral Daily 10/15/21 1156 10/21/21 2359   10/15/21 1000  cefdinir (OMNICEF) capsule 600 mg        600 mg Oral Daily 10/14/21 1536 10/18/21 0959   10/15/21 1000  linezolid (ZYVOX) tablet 600 mg        600 mg Oral Every 12 hours 10/14/21 1536 10/18/21 0959   10/15/21 0000  linezolid (ZYVOX) 600 MG tablet        600 mg Oral Every 12 hours 10/15/21 1156 10/18/21 2359   10/14/21 1100  vancomycin (VANCOREADY) IVPB 1250 mg/250 mL  Status:  Discontinued        1,250 mg 166.7 mL/hr over 90 Minutes Intravenous Every 24 hours 10/14/21 1006 10/14/21 1536   10/12/21 1000  vancomycin (VANCOCIN) IVPB 1000 mg/200 mL premix  Status:  Discontinued        1,000 mg 200 mL/hr over 60 Minutes Intravenous  Every 24 hours 10/11/21 0901 10/14/21 1006   10/11/21 0915  vancomycin (VANCOCIN) 2,250 mg in sodium chloride 0.9 % 500 mL IVPB        2,250 mg 250 mL/hr over 120 Minutes Intravenous  Once 10/11/21 0901 10/11/21 1341   10/11/21 0900  cefTRIAXone (ROCEPHIN) 2 g in sodium chloride 0.9 % 100 mL IVPB  Status:  Discontinued        2 g 200 mL/hr over 30 Minutes Intravenous Every 24 hours 10/11/21 0849 10/14/21 1536   10/11/21 0900  metroNIDAZOLE (FLAGYL) IVPB 500 mg  Status:  Discontinued        500 mg 100 mL/hr over 60 Minutes Intravenous Every 12 hours 10/11/21 0849 10/14/21 1536      Culture/Microbiology    Component Value Date/Time   SDES URINE, CLEAN CATCH 10/11/2021 2218   SPECREQUEST NONE 10/11/2021 2218   CULT (A) 10/11/2021 2218    <10,000 COLONIES/mL >=100,000 COLONIES/mL Performed at [facility], [place] [address], [place], [place] [address]    REPTSTATUS 10/14/2021 FINAL 10/11/2021 2218    Other culture-see note  Unresulted Labs (From admission, onward)     Start     Ordered   10/13/21 0500  Protime-INR  Daily,   R     Question:  Specimen collection method  Answer:  Lab=Lab collect   10/12/21 1950          Data Reviewed: I have personally reviewed following labs and imaging studies CBC: Recent Labs  Lab 10/11/21 0235 10/12/21 0305 10/13/21 0239 10/14/21 0143 10/15/21 0317  WBC 12.4* 10.2 9.5 9.1 9.1  NEUTROABS 7.7  --  5.4 5.2 4.9  HGB 11.1* 10.2* 9.9* 10.6* 10.1*  HCT 36.2 32.3* 32.2* 33.6* 33.4*  MCV 95.8 94.2 94.2 94.1 94.4  PLT 499* 402* 397 396 397   Basic Metabolic Panel: Recent Labs  Lab 10/11/21 0235 10/12/21 0305 10/13/21 0239 10/14/21 0143 10/15/21 0317  NA 135 133* 135 134* 134*  K 4.6 4.2 4.2 4.4 4.4  CL 102 100 105 103 100  CO2 25 24 22 24 24   GLUCOSE 235* 191* 157* 190* 203*  BUN 13 16 15 18 18   CREATININE 1.59* 1.71* 1.43* 1.52* 1.57*  CALCIUM 10.0 9.5 9.0 9.3 9.7  MG  --   --  1.8  --   --    GFR: Estimated Creatinine  Clearance: 58.2 mL/min (A) (by C-G formula based on SCr of 1.57 mg/dL (H)). Liver Function Tests: Recent Labs  Lab 10/11/21 0235  AST 13*  ALT 14  ALKPHOS 83  BILITOT 0.4  PROT 7.8  ALBUMIN 3.1*   No results for input(s): LIPASE, AMYLASE in the last 168 hours. No results for input(s): AMMONIA in the last 168 hours. Coagulation Profile: Recent Labs  Lab 10/11/21 0235 10/12/21 0305 10/13/21 0239 10/14/21 0143 10/15/21 0317  INR 2.2* 1.9* 1.3* 1.1 1.1   Cardiac Enzymes: No results for input(s): CKTOTAL, CKMB, CKMBINDEX, TROPONINI in the last 168 hours. BNP (last 3 results) No results for input(s): PROBNP in the last 8760 hours. HbA1C: No results for input(s): HGBA1C in the last 72 hours. CBG: Recent Labs  Lab 10/14/21 1127 10/14/21 1644 10/14/21 2151 10/15/21 0629 10/15/21 1127  GLUCAP 201* 201* 201* 232* 123*   Lipid Profile: No results for input(s): CHOL, HDL, LDLCALC, TRIG, CHOLHDL, LDLDIRECT in the last 72 hours. Thyroid Function Tests: No results for input(s): TSH, T4TOTAL, FREET4, T3FREE, THYROIDAB in the last 72 hours. Anemia Panel: No results for input(s): VITAMINB12, FOLATE, FERRITIN, TIBC, IRON, RETICCTPCT in the last 72 hours. Sepsis Labs: Recent Labs  Lab 10/11/21 0901  LATICACIDVEN 0.9    Recent Results (from the past 240 hour(s))  Blood Cultures x 2 sites     Status: None (Preliminary result)   Collection Time: 10/11/21  8:05 AM   Specimen: BLOOD  Result  Value Ref Range Status   Specimen Description BLOOD RIGHT ANTECUBITAL  Final   Special Requests   Final    BOTTLES DRAWN AEROBIC AND ANAEROBIC Blood Culture adequate volume   Culture   Final    NO GROWTH 4 DAYS Performed at [facility], [address] [place], [place] [address]    Report Status PENDING  Incomplete  Blood Cultures x 2 sites     Status: None (Preliminary result)   Collection Time: 10/11/21  9:15 AM   Specimen: BLOOD LEFT FOREARM  Result Value Ref Range Status    Specimen Description BLOOD LEFT FOREARM  Final   Special Requests   Final    BOTTLES DRAWN AEROBIC AND ANAEROBIC Blood Culture adequate volume   Culture   Final    NO GROWTH 4 DAYS Performed at [facility], [place] [address], [place], [place] [address]    Report Status PENDING  Incomplete  Resp Panel by RT-PCR (Flu A&B, Covid) Nasopharyngeal Swab     Status: None   Collection Time: 10/11/21  9:31 AM   Specimen: Nasopharyngeal Swab; Nasopharyngeal(NP) swabs in vial transport medium  Result Value Ref Range Status   SARS Coronavirus 2 by RT PCR NEGATIVE NEGATIVE Final    Comment: (NOTE) SARS-CoV-2 target nucleic acids are NOT DETECTED.  The SARS-CoV-2 RNA is generally detectable in upper respiratory specimens during the acute phase of infection. The lowest concentration of SARS-CoV-2 viral copies this assay can detect is 138 copies/mL. A negative result does not preclude SARS-Cov-2 infection and should not be used as the sole basis for treatment or other patient management decisions. A negative result may occur with  improper specimen collection/handling, submission of specimen other than nasopharyngeal swab, presence of viral mutation(s) within the areas targeted by this assay, and inadequate number of viral copies(<138 copies/mL). A negative result must be combined with clinical observations, patient history, and epidemiological information. The expected result is Negative.  Fact Sheet for Patients:  EntrepreneurPulse.com.au  Fact Sheet for Healthcare Providers:  IncredibleEmployment.be  This test is no t yet approved or cleared by the [place] FDA and  has been authorized for detection and/or diagnosis of SARS-CoV-2 by FDA under an Emergency Use Authorization (EUA). This EUA will remain  in effect (meaning this test can be used) for the duration of the COVID-19 declaration under Section 564(b)(1) of the Act, 21 U.S.C.section  360bbb-3(b)(1), unless the authorization is terminated  or revoked sooner.       Influenza A by PCR NEGATIVE NEGATIVE Final   Influenza B by PCR NEGATIVE NEGATIVE Final    Comment: (NOTE) The Xpert Xpress SARS-CoV-2/FLU/RSV plus assay is intended as an aid in the diagnosis of influenza from Nasopharyngeal swab specimens and should not be used as a sole basis for treatment. Nasal washings and aspirates are unacceptable for Xpert Xpress SARS-CoV-2/FLU/RSV testing.  Fact Sheet for Patients: EntrepreneurPulse.com.au  Fact Sheet for Healthcare Providers: IncredibleEmployment.be  This test is not yet approved or cleared by the [place] FDA and has been authorized for detection and/or diagnosis of SARS-CoV-2 by FDA under an Emergency Use Authorization (EUA). This EUA will remain in effect (meaning this test can be used) for the duration of the COVID-19 declaration under Section 564(b)(1) of the Act, 21 U.S.C. section 360bbb-3(b)(1), unless the authorization is terminated or revoked.  Performed at [facility], [place] [address] [place], [place] [address]   Urine Culture     Status: Abnormal  Collection Time: 10/11/21 10:18 PM   Specimen: Urine, Clean Catch  Result Value Ref Range Status   Specimen Description URINE, CLEAN CATCH  Final   Special Requests NONE  Final   Culture (A)  Final    <10,000 COLONIES/mL >=100,000 COLONIES/mL Performed at [facility], [address] [place],  [address]    Report Status 10/14/2021 FINAL  Final     Radiology Studies: No results found.   LOS: 4 days   Terri Pert, MD [facility]  10/15/2021, 4:07 PM

## 2021-10-15 NOTE — Progress Notes (Signed)
ANTICOAGULATION CONSULT NOTE - Follow Up Consult  Pharmacy Consult for Heparin > Lovenox and Warfarin Indication:  hx VTE, limb ischemia  Allergies  Allergen Reactions   Ciprofloxacin Hcl Hives   Dilaudid [Hydromorphone Hcl] Shortness Of Breath and Other (See Comments)    "Asystole," per pt report   Macrobid [Nitrofurantoin Macrocrystal] Hives, Shortness Of Breath and Rash   Other Hives, Shortness Of Breath and Rash    NO "-CILLINS"!!!   Penicillins Shortness Of Breath    Had had cephalosporins without incident Has patient had a PCN reaction causing immediate rash, facial/tongue/throat swelling, SOB or lightheadedness with hypotension: Yes Has patient had a PCN reaction causing severe rash involving mucus membranes or skin necrosis: Unk Has patient had a PCN reaction that required hospitalization: Unk Has patient had a PCN reaction occurring within the last 10 years: No If all of the above answers are "NO", then may proceed with Cephalosporin use.    Sulfa Antibiotics Hives, Shortness Of Breath and Rash   Lexapro [Escitalopram Oxalate] Other (See Comments)    "I just did not like it."    Patient Measurements: Height: 5\' 7"  (170.2 cm) Weight: 113.4 kg (250 lb) IBW/kg (Calculated) : 61.6 Heparin Dosing Weight: 91 kg  Vital Signs: Temp: 97.9 F (36.6 C) (01/11 2129) Temp Source: Oral (01/11 2129) BP: 110/64 (01/11 2129) Pulse Rate: 84 (01/11 2129)  Labs: Recent Labs    10/13/21 0239 10/13/21 1034 10/14/21 0143 10/14/21 1008 10/15/21 0317  HGB 9.9*  --  10.6*  --  10.1*  HCT 32.2*  --  33.6*  --  33.4*  PLT 397  --  396  --  376  LABPROT 16.3*  --  14.2  --  14.5  INR 1.3*  --  1.1  --  1.1  HEPARINUNFRC 0.13*   < > 0.21* 0.51 0.41  CREATININE 1.43*  --  1.52*  --  1.57*   < > = values in this interval not displayed.     Estimated Creatinine Clearance: 58.2 mL/min (A) (by C-G formula based on SCr of 1.57 mg/dL (H)).  Assessment: 46 yo F presenting with  worsening foot wound.  Pt on warfarin PTA for hx of VTE.  Warfarin initially held for possible procedure with VVS.  INR therapeutic on admission at 2.2 > down to 1.9 on 10/12/21 and IV heparin begun.  Warfarin restarted 1/10 with no plans for surgical intervention.  Heparin level therapeutic on 2100 units/hr. INR is down to 1.1. Asked by MD to transition to Lovenox in anticipation of discharge home with Lovenox > Warfain bridge.   PTA warfarin regimen: 10 mg MWF, 5 mg TTSS.  Last dose 1/7.   Goal of Therapy:  INR 2-3 Anti-Xa level 0.6-1 units/ml 4hrs after LMWH dose given Monitor platelets by anticoagulation protocol: Yes   Plan:  Start Lovenox 120mg  SQ q12h D/C heaprin infusion and associated labs. Repeat warfarin 10 mg x 1 dose today  Daily PT/INR. Monitor for any complaints of hematuria  If discharged, would recommend restart home warfarin regimen with next INR check on Monday 1/16.  [employer], Pharm.D., BCPS Clinical Pharmacist Clinical phone for 10/15/2021 from 7:30-3:00 is [phone].  **Pharmacist phone directory can be found on [email or web address] listed under [place].  10/15/2021 9:18 AM

## 2021-10-16 ENCOUNTER — Telehealth: Payer: Self-pay | Admitting: Internal Medicine

## 2021-10-16 ENCOUNTER — Telehealth: Payer: Self-pay

## 2021-10-16 ENCOUNTER — Other Ambulatory Visit (HOSPITAL_COMMUNITY): Payer: Self-pay

## 2021-10-16 LAB — CULTURE, BLOOD (ROUTINE X 2)
Culture: NO GROWTH
Culture: NO GROWTH
Special Requests: ADEQUATE
Special Requests: ADEQUATE

## 2021-10-16 NOTE — Telephone Encounter (Signed)
Copied from [place] [phone]. Topic: General - Other >> Oct 15, 2021  3:28 PM Yvette Rack wrote: Reason for CRM: Pt stated she was returning call as requested. Pt requests call back. Cb# [phone]

## 2021-10-16 NOTE — Telephone Encounter (Signed)
Looked in pt chart and don't see where anyone has tried contacting her from [facility]

## 2021-10-19 ENCOUNTER — Telehealth: Payer: Self-pay

## 2021-10-19 ENCOUNTER — Telehealth: Payer: Self-pay | Admitting: Internal Medicine

## 2021-10-19 NOTE — Telephone Encounter (Signed)
Medication Refill - Medication: Glucometer Pt stated that it was unintentionally discarded while she was in the hospital.   Pt requested a blood pressure cuff.  Has the patient contacted their pharmacy? No. No refills   (Agent: If no, request that the patient contact the pharmacy for the refill. If patient does not wish to contact the pharmacy document the reason why and proceed with request.)   Preferred Pharmacy (with phone number or street name):  [place], [place] - [address]  [place] [address]  Phone: [phone] Fax: [phone]  Hours: Not open 24 hours   Has the patient been seen for an appointment in the last year OR does the patient have an upcoming appointment? Yes.    Agent: Please be advised that RX refills may take up to 3 business days. We ask that you follow-up with your pharmacy.

## 2021-10-19 NOTE — Telephone Encounter (Signed)
Transition Care Management Unsuccessful Follow-up Telephone Call  Date of discharge and from where:  10/15/2021, [facility] - left AMA  Attempts:  2nd Attempt  Reason for unsuccessful TCM follow-up call:  Left voice message on # [phone]. Call back requested to this CM

## 2021-10-20 ENCOUNTER — Telehealth: Payer: Self-pay

## 2021-10-20 MED ORDER — ACCU-CHEK GUIDE W/DEVICE KIT: PACK | 0 refills | Status: AC

## 2021-10-20 NOTE — Telephone Encounter (Signed)
Transition Care Management Unsuccessful Follow-up Telephone Call  Date of discharge and from where:  10/15/2021, [facility], left AMA  Attempts:  3rd Attempt  Reason for unsuccessful TCM follow-up call:  Left voice message on # [phone]. Call back requested to this CM.  Call had initially been placed to # [phone] and her son answered and said to call her at # [phone].   Patient has appointment at [facility] on 11/05/2021.

## 2021-10-21 ENCOUNTER — Other Ambulatory Visit: Payer: Self-pay

## 2021-10-21 NOTE — Patient Instructions (Signed)
[facility] Team is available to provide assistance to you with your healthcare needs at no cost and as a benefit of your [facility] Health plan. I'm sorry I was unable to reach you today for our scheduled appointment. Our care guide will call you to reschedule our telephone appointment. Please call me at the number below. I am available to be of assistance to you regarding your healthcare needs. .   Thank you,   Salvatore Marvel RN, BSN [facility] Coordinator [place] [facility] Mobile: [phone]

## 2021-10-21 NOTE — Patient Outreach (Signed)
Care Coordination  10/21/2021  Terri Sparks [birth year] [id number]  10/21/2021 Name: Terri Sparks MRN: [id number] DOB: 05-Dec-1975  Referred by: Ladell Pier, MD Reason for referral : High Risk Managed Medicaid (Unsuccessful Telephone Outreach)   An unsuccessful telephone outreach was attempted today. The patient was referred to the case management team for assistance with care management and care coordination.  This RN Care Manager attempted 3 unsuccessful telephone outreaches today.  Patient's telephone voicemail was full and did not allow a voicemail message to be left.  I did call other phone number listed and person answering said to call [phone] to contact patient.  Follow Up Plan: The Managed Medicaid care management team will reach out to the patient again over the next 7 -14 days.    Salvatore Marvel RN, BSN Community Care Coordinator [place] [facility] Mobile: [phone]

## 2021-10-22 ENCOUNTER — Inpatient Hospital Stay: Payer: Medicaid Other | Admitting: Physician Assistant

## 2021-10-26 ENCOUNTER — Telehealth: Payer: Self-pay | Admitting: Internal Medicine

## 2021-10-26 NOTE — Telephone Encounter (Signed)
.. °  Medicaid Managed Care   Unsuccessful Outreach Note  10/26/2021 Name: Terri Sparks MRN: [id number] DOB: [birth year]  Referred by: Ladell Pier, MD Reason for referral : High Risk Managed Medicaid (I called the patient today to get her phone appt with the MM RNCM rescheduled. Her VM was full.)   An unsuccessful telephone outreach was attempted today. The patient was referred to the case management team for assistance with care management and care coordination.   Follow Up Plan: The care management team will reach out to the patient again over the next 7 days.   [place]

## 2021-11-03 ENCOUNTER — Other Ambulatory Visit (HOSPITAL_COMMUNITY): Payer: Self-pay | Admitting: Physician Assistant

## 2021-11-03 ENCOUNTER — Other Ambulatory Visit: Payer: Self-pay | Admitting: Internal Medicine

## 2021-11-03 DIAGNOSIS — F339 Major depressive disorder, recurrent, unspecified: Secondary | ICD-10-CM

## 2021-11-03 DIAGNOSIS — G4700 Insomnia, unspecified: Secondary | ICD-10-CM

## 2021-11-03 DIAGNOSIS — F411 Generalized anxiety disorder: Secondary | ICD-10-CM

## 2021-11-03 NOTE — Telephone Encounter (Signed)
Requested medication (s) are due for refill today: No. Has refill available  Requested medication (s) are on the active medication list: yes    Last refill: 10/01/21  #90  1 refill  Future visit scheduled yes 11/05/21 Thursday  Notes to clinic: Please review. Request sent via Interface. Multi no shows and cancellations, unable to reach. Pt has appt 11/05/21, Thursday.  Requested Prescriptions  Pending Prescriptions Disp Refills   gabapentin (NEURONTIN) 400 MG capsule [Pharmacy Med Name: GABAPENTIN 400 MG ORAL CAPSULE] 90 capsule 1    Sig: TAKE 1 CAPSULE (400 MG TOTAL) BY MOUTH 3 (THREE) TIMES DAILY.     Neurology: Anticonvulsants - gabapentin Failed - 11/03/2021 12:59 PM      Failed - Cr in normal range and within 360 days    Creatinine  Date Value Ref Range Status  02/19/2020 1.88 (H) 0.44 - 1.00 mg/dL Final  07/25/2018 75.1 20.0 - 300.0 mg/dL Final   Creatinine, Ser  Date Value Ref Range Status  10/15/2021 1.57 (H) 0.44 - 1.00 mg/dL Final          Passed - Completed PHQ-2 or PHQ-9 in the last 360 days      Passed - Valid encounter within last 12 months    Recent Outpatient Visits           3 months ago History of arterial thrombosis   [place], MD   6 months ago No-show for appointment   [place] Karle Plumber B, MD   8 months ago Type 2 diabetes mellitus with morbid obesity [facility])   [place], MD   9 months ago Essential hypertension   [place], MD   1 year ago Preoperative evaluation to rule out surgical contraindication   [place], MD       Future Appointments             In 2 days Thereasa Solo, Casimer Bilis [place]

## 2021-11-04 ENCOUNTER — Other Ambulatory Visit: Payer: Self-pay | Admitting: Internal Medicine

## 2021-11-04 DIAGNOSIS — E1169 Type 2 diabetes mellitus with other specified complication: Secondary | ICD-10-CM

## 2021-11-04 DIAGNOSIS — Z86718 Personal history of other venous thrombosis and embolism: Secondary | ICD-10-CM

## 2021-11-04 NOTE — Telephone Encounter (Signed)
Future visit scheduled: tomorrow 11/05/21  Notes to clinic:  This medication can not be delegated, please assess.     Requested Prescriptions  Pending Prescriptions Disp Refills   warfarin (COUMADIN) 5 MG tablet [Pharmacy Med Name: WARFARIN SODIUM 5 MG ORAL TABLET] 60 tablet     Sig: TAKE ONE TABLET ( 5 MG) BY MOUTH AT NOON ON SUN/TUES/THURS/SAT AND 2 TABLETS (10 MG) ON MON/WED/FRI     Hematology:  Anticoagulants - warfarin Failed - 11/04/2021 12:53 PM      Failed - Manual Review: If patient's warfarin is managed by Anti-Coag team, route request to them. If not, route request to the provider.      Failed - HCT in normal range and within 360 days    HCT  Date Value Ref Range Status  10/15/2021 33.4 (L) 36.0 - 46.0 % Final   Hematocrit  Date Value Ref Range Status  07/21/2021 36.8 34.0 - 46.6 % Final          Failed - Valid encounter within last 3 months    Recent Outpatient Visits           3 months ago History of arterial thrombosis   [place], MD   6 months ago No-show for appointment   [place] Karle Plumber B, MD   8 months ago Type 2 diabetes mellitus with morbid obesity [facility])   [place] Ladell Pier, MD   10 months ago Essential hypertension   [place], MD   1 year ago Preoperative evaluation to rule out surgical contraindication   [place], MD       Future Appointments             Tomorrow Mathis Dad [place] - INR in normal range and within 30 days    INR  Date Value Ref Range Status  10/15/2021 1.1 0.8 - 1.2 Final    Comment:    (NOTE) INR goal varies based on device and disease states. Performed at [facility], [place] [address], [place], [place] [address]            Passed - Patient is not pregnant      Signed Prescriptions Disp Refills   ACCU-CHEK GUIDE test strip 100 each 0    Sig: USE AS INSTRUCTED [place]     Endocrinology: Diabetes - Testing Supplies Passed - 11/04/2021 12:53 PM      Passed - Valid encounter within last 12 months    Recent Outpatient Visits           3 months ago History of arterial thrombosis   [place], MD   6 months ago No-show for appointment   [place] Karle Plumber B, MD   8 months ago Type 2 diabetes mellitus with morbid obesity [facility])   [place] Ladell Pier, MD   10 months ago Essential hypertension   [place], Patrick E, MD   1 year ago Preoperative evaluation to rule out surgical contraindication   [place] [facility] Ladell Pier, MD  Future Appointments             Tomorrow Thereasa Solo, Casimer Bilis [place]

## 2021-11-04 NOTE — Telephone Encounter (Signed)
Requested Prescriptions  Pending Prescriptions Disp Refills   ACCU-CHEK GUIDE test strip [Pharmacy Med Name: [place] STRIP] 100 each 0    Sig: USE AS INSTRUCTED 3 TIMES A DAY     Endocrinology: Diabetes - Testing Supplies Passed - 11/04/2021 12:53 PM      Passed - Valid encounter within last 12 months    Recent Outpatient Visits          3 months ago History of arterial thrombosis   [place], MD   6 months ago No-show for appointment   [place] Karle Plumber B, MD   8 months ago Type 2 diabetes mellitus with morbid obesity [facility])   [place] Ladell Pier, MD   10 months ago Essential hypertension   [place] Elsie Stain, MD   1 year ago Preoperative evaluation to rule out surgical contraindication   [place], MD      Future Appointments            Tomorrow Mathis Dad [place]            warfarin (COUMADIN) 5 MG tablet [Pharmacy Med Name: WARFARIN SODIUM 5 MG ORAL TABLET] 60 tablet     Sig: TAKE ONE TABLET ( 5 MG) BY MOUTH AT NOON ON SUN/TUES/THURS/SAT AND 2 TABLETS (10 MG) ON MON/WED/FRI     Hematology:  Anticoagulants - warfarin Failed - 11/04/2021 12:53 PM      Failed - Manual Review: If patient's warfarin is managed by Anti-Coag team, route request to them. If not, route request to the provider.      Failed - HCT in normal range and within 360 days    HCT  Date Value Ref Range Status  10/15/2021 33.4 (L) 36.0 - 46.0 % Final   Hematocrit  Date Value Ref Range Status  07/21/2021 36.8 34.0 - 46.6 % Final         Failed - Valid encounter within last 3 months    Recent Outpatient Visits          3 months ago History of arterial thrombosis   [place], MD   6 months ago No-show for appointment   [place] Karle Plumber B, MD   8 months ago Type 2 diabetes mellitus with morbid obesity [facility])   [place] Ladell Pier, MD   10 months ago Essential hypertension   [place], MD   1 year ago Preoperative evaluation to rule out surgical contraindication   [place], MD      Future Appointments            Tomorrow Mathis Dad [place] - INR in normal range and within 30 days    INR  Date Value Ref Range Status  10/15/2021 1.1 0.8 - 1.2 Final    Comment:    (NOTE) INR goal varies based on device and disease states. Performed at [facility], [place] [address] [place], [place] [address]  Passed - Patient is not pregnant

## 2021-11-04 NOTE — Progress Notes (Deleted)
Patient ID: Terri Sparks, female   DOB: 07-Nov-1975, 46 y.o.   MRN: [id number]   After hospitalization and leaving [facility]  From A/P:  Assessment & Plan   Sepsis POA Cellulitis left lower extremity  Diabetic foot infection Left great toe dry gangrene: Sepsis multifactorial due to cellulitis foot infection and UTI.  Presented with worsening left great toe infection-managed IV antibiotics seen by vascular, cellulitis has resolved, continue wound care on the left great toe, antibiotic changed to p.o. Zyvox and cefdinir- cont same. High risk for amputation given her PVD continue triple therapy as below.Vascular surgery has signed off   PVD S/P multiple stents on left side, on triple therapy with Coumadin aspirin Plavix at home-on last discharge on 09/19/2027 Dr. Luan Pulling recommended indefinite aspirin Plavix and Coumadin-confirmed with him again 1/11.  While INR is subtherapeutic will do Lovenox injection supply given for 5 days with instruction for daily INR monitoring at wellness center/her PCP, caseworker/TOC to arrange for transport   Anxiety and depression: Mood is stable on Klonopin, PRN Atarax.  Followed by outpatient psychiatry   Polysubstance abuse/tobacco abuse Acute metabolic encephalopathy: lethargic on admission likely polypharmacy UDS showed THC and cocaine.Mentation improved.  Mentation is stable.  Continue her home meds-klonopin, oxycodone   HTN: BP is controlled, at times soft, on amlodipine.   CKD stage IIIb: Renal function stable  Last Labs          Recent Labs  Lab 10/11/21 0235 10/12/21 0305 10/13/21 0239 10/14/21 0143 10/15/21 0317  BUN 13 16 15 18 18   CREATININE 1.59* 1.71* 1.43* 1.52* 1.57*      Acute lower UTI/cystitis Hematuria Right ureteral stent in place since July 2021: No evidence of pyelonephritis on renal ultrasoundUrology was consulted and discussed by previous attending and planning for probable stent removal outpatient, hematuria likely contributed  by underlying stent in the setting of anticoagulation.  Continue cefdinir to complete the course. Blood culture negative so far.     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  Monitor oral intake Last Labs          Recent Labs  Lab 10/14/21 1127 10/14/21 1644 10/14/21 2151 10/15/21 0629 10/15/21 1127  GLUCAP 201* 201* 201* 232* 123*      Anemia of chronic disease: Hb 11 g on admission, stable, monitor transfuse for less than 7 Last Labs          Recent Labs  Lab 10/11/21 0235 10/12/21 0305 10/13/21 0239 10/14/21 0143 10/15/21 0317  HGB 11.1* 10.2* 9.9* 10.6* 10.1*  HCT 36.2 32.3* 32.2* 33.6* 33.4*      Class II Obesity:Patient's Body mass index is 39.16 kg/m. : Will benefit with PCP follow-up, weight loss  healthy lifestyle and outpatient sleep evaluation.

## 2021-11-05 ENCOUNTER — Inpatient Hospital Stay: Payer: Medicaid Other | Admitting: Physician Assistant

## 2021-11-09 ENCOUNTER — Other Ambulatory Visit: Payer: Self-pay

## 2021-11-09 DIAGNOSIS — I779 Disorder of arteries and arterioles, unspecified: Secondary | ICD-10-CM

## 2021-11-10 ENCOUNTER — Other Ambulatory Visit (HOSPITAL_COMMUNITY): Payer: Self-pay | Admitting: Physician Assistant

## 2021-11-10 DIAGNOSIS — F339 Major depressive disorder, recurrent, unspecified: Secondary | ICD-10-CM

## 2021-11-11 ENCOUNTER — Encounter (HOSPITAL_COMMUNITY): Payer: Medicaid Other

## 2021-11-20 ENCOUNTER — Encounter (HOSPITAL_COMMUNITY): Payer: Self-pay

## 2021-11-20 ENCOUNTER — Telehealth (HOSPITAL_COMMUNITY): Payer: Medicaid Other | Admitting: Physician Assistant

## 2021-11-23 ENCOUNTER — Other Ambulatory Visit: Payer: Self-pay | Admitting: Internal Medicine

## 2021-11-23 ENCOUNTER — Telehealth: Payer: Self-pay

## 2021-11-23 DIAGNOSIS — Z86718 Personal history of other venous thrombosis and embolism: Secondary | ICD-10-CM

## 2021-11-23 DIAGNOSIS — E1169 Type 2 diabetes mellitus with other specified complication: Secondary | ICD-10-CM

## 2021-11-23 DIAGNOSIS — F17200 Nicotine dependence, unspecified, uncomplicated: Secondary | ICD-10-CM

## 2021-11-23 NOTE — Telephone Encounter (Signed)
Copied from [place] [phone]. Topic: General - Other >> Nov 23, 2021  2:36 PM Tessa Lerner A wrote: Reason for CRM: The patient would like to speak with a Education officer, museum when possible  The patient has additional concern related to previously denied medications    Please contact further when possible

## 2021-11-23 NOTE — Telephone Encounter (Signed)
Copied from [place] [phone]. Topic: Quick Communication - Rx Refill/Question >> Nov 23, 2021  2:39 PM Lenon Curt, Rana Snare A wrote: Medication: warfarin (COUMADIN) 5 MG tablet [[id number]]   Rx #: [id number]  clopidogrel (PLAVIX) 75 MG tablet [[id number]]   VENTOLIN HFA 108 (90 Base) MCG/ACT inhaler [[id number]]   insulin glargine (LANTUS SOLOSTAR) 100 UNIT/ML Solostar Pen [[id number]]   Rx #: [id number]  nicotine (NICODERM CQ - DOSED IN MG/24 HOURS) 21 mg/24hr patch [[id number]]   ACCU-CHEK GUIDE test strip [[id number]]   Testing Meter   Blood pressure cuff   Has the patient contacted their pharmacy? No. (Agent: If no, request that the patient contact the pharmacy for the refill. If patient does not wish to contact the pharmacy document the reason why and proceed with request.) (Agent: If yes, when and what did the pharmacy advise?)  Preferred Pharmacy (with phone number or street name): [place], [place] [address] Phone: [phone] Fax: [phone]  Has the patient been seen for an appointment in the last year OR does the patient have an upcoming appointment? Yes.    Agent: Please be advised that RX refills may take up to 3 business days. We ask that you follow-up with your pharmacy.

## 2021-11-24 ENCOUNTER — Other Ambulatory Visit: Payer: Self-pay

## 2021-11-24 ENCOUNTER — Other Ambulatory Visit: Payer: Self-pay | Admitting: Internal Medicine

## 2021-11-24 ENCOUNTER — Telehealth (HOSPITAL_COMMUNITY): Payer: Medicaid Other | Admitting: Physician Assistant

## 2021-11-24 ENCOUNTER — Encounter (HOSPITAL_COMMUNITY): Payer: Self-pay

## 2021-11-24 ENCOUNTER — Telehealth: Payer: Self-pay | Admitting: Internal Medicine

## 2021-11-24 DIAGNOSIS — I152 Hypertension secondary to endocrine disorders: Secondary | ICD-10-CM

## 2021-11-24 DIAGNOSIS — E1159 Type 2 diabetes mellitus with other circulatory complications: Secondary | ICD-10-CM

## 2021-11-24 MED ORDER — LANTUS SOLOSTAR 100 UNIT/ML ~~LOC~~ SOPN: 65.0000 [IU] | PEN_INJECTOR | Freq: Every day | SUBCUTANEOUS | 2 refills | Status: AC

## 2021-11-24 MED ORDER — VENTOLIN HFA 108 (90 BASE) MCG/ACT IN AERS: INHALATION_SPRAY | RESPIRATORY_TRACT | 2 refills | Status: AC

## 2021-11-24 MED ORDER — NICOTINE 21 MG/24HR TD PT24: 21.0000 mg | MEDICATED_PATCH | Freq: Every day | TRANSDERMAL | 2 refills | Status: AC

## 2021-11-24 MED ORDER — ACCU-CHEK GUIDE VI STRP: ORAL_STRIP | 2 refills | Status: DC

## 2021-11-24 MED ORDER — CLOPIDOGREL BISULFATE 75 MG PO TABS: 75.0000 mg | ORAL_TABLET | Freq: Every day | ORAL | 0 refills | Status: AC

## 2021-11-24 MED ORDER — WARFARIN SODIUM 5 MG PO TABS: ORAL_TABLET | ORAL | 0 refills | Status: AC

## 2021-11-24 NOTE — Telephone Encounter (Signed)
Patient requesting a BP cuff machine.

## 2021-11-24 NOTE — Telephone Encounter (Signed)
Will forward to provider  

## 2021-11-24 NOTE — Telephone Encounter (Signed)
Rx has been printed will wait for provider to sign

## 2021-11-24 NOTE — Telephone Encounter (Signed)
Requested medication (s) are due for refill today: yes  Requested medication (s) are on the active medication list: yes  Last refill:  09/26/21 for plavix, 10/15/21 for coumadin  Future visit scheduled: yes  Notes to clinic:  Unable to refill per protocol due to failed labs, no updated results.      Requested Prescriptions  Pending Prescriptions Disp Refills   clopidogrel (PLAVIX) 75 MG tablet 30 tablet 3    Sig: Take 1 tablet (75 mg total) by mouth daily.     Hematology: Antiplatelets - clopidogrel Failed - 11/24/2021 10:43 AM      Failed - HCT in normal range and within 180 days    HCT  Date Value Ref Range Status  10/15/2021 33.4 (L) 36.0 - 46.0 % Final   Hematocrit  Date Value Ref Range Status  07/21/2021 36.8 34.0 - 46.6 % Final          Failed - HGB in normal range and within 180 days    Hemoglobin  Date Value Ref Range Status  10/15/2021 10.1 (L) 12.0 - 15.0 g/dL Final  07/21/2021 11.8 11.1 - 15.9 g/dL Final          Failed - Cr in normal range and within 360 days    Creatinine  Date Value Ref Range Status  02/19/2020 1.88 (H) 0.44 - 1.00 mg/dL Final  07/25/2018 75.1 20.0 - 300.0 mg/dL Final   Creatinine, Ser  Date Value Ref Range Status  10/15/2021 1.57 (H) 0.44 - 1.00 mg/dL Final          Passed - PLT in normal range and within 180 days    Platelets  Date Value Ref Range Status  10/15/2021 376 150 - 400 K/uL Final  07/21/2021 439 150 - 450 x10E3/uL Final          Passed - Valid encounter within last 6 months    Recent Outpatient Visits           4 months ago History of arterial thrombosis   [place], Deborah B, MD   7 months ago No-show for appointment   [place] Ladell Pier, MD   9 months ago Type 2 diabetes mellitus with morbid obesity [facility])   [place] Ladell Pier, MD   10 months ago Essential hypertension   [place], Patrick E, MD   1 year ago Preoperative evaluation to rule out surgical contraindication   [place], MD       Future Appointments             In 1 week Gildardo Pounds, NP [place]             warfarin (COUMADIN) 5 MG tablet      Sig: Take 1-2 tablets (5-10 mg total) by mouth See admin instructions. While on lovenox take 10 mg dailyx4 until inr close to 2- then go to previous schedule(10 mg on MWF, 5 mg on SSTTh).     Hematology:  Anticoagulants - warfarin Failed - 11/24/2021 10:43 AM      Failed - Manual Review: If patient's warfarin is managed by Anti-Coag team, route request to them. If not, route request to the provider.      Failed - INR in normal range and within 30 days    INR  Date Value Ref Range Status  10/15/2021 1.1 0.8 - 1.2 Final    Comment:    (NOTE) INR goal varies based on device and disease states. Performed at [facility], [place] [address] [place], [place] [address]           Failed - HCT in normal range and within 360 days    HCT  Date Value Ref Range Status  10/15/2021 33.4 (L) 36.0 - 46.0 % Final   Hematocrit  Date Value Ref Range Status  07/21/2021 36.8 34.0 - 46.6 % Final          Failed - Valid encounter within last 3 months    Recent Outpatient Visits           4 months ago History of arterial thrombosis   [place], MD   7 months ago No-show for appointment   [place] Karle Plumber B, MD   9 months ago Type 2 diabetes mellitus with morbid obesity [facility])   [place] Ladell Pier, MD   10 months ago Essential hypertension   [place], MD   1 year ago Preoperative evaluation to rule out surgical contraindication   [place], MD       Future Appointments             In 1 week Gildardo Pounds, NP [place] - Patient is not pregnant      Signed Prescriptions Disp Refills   VENTOLIN HFA 108 (90 Base) MCG/ACT inhaler 18 g 2    Sig: INHALE TWO PUFFS BY MOUTH EVERY SIX HOURS AS NEEDED FOR WHEEZING OR SHORTNESS OF BREATH Strength: 108 (90 Base) MCG/ACT     Pulmonology:  Beta Agonists 2 Passed - 11/24/2021 10:43 AM      Passed - Last BP in normal range    BP Readings from Last 1 Encounters:  10/15/21 134/74          Passed - Last Heart Rate in normal range    Pulse Readings from Last 1 Encounters:  10/15/21 85          Passed - Valid encounter within last 12 months    Recent Outpatient Visits           4 months ago History of arterial thrombosis   [place], MD   7 months ago No-show for appointment   [place] Karle Plumber B, MD   9 months ago Type 2 diabetes mellitus with morbid obesity [facility])   [place], MD   10 months ago Essential hypertension   [place], Patrick E, MD   1 year ago Preoperative evaluation to rule out surgical contraindication   [place], MD       Future Appointments             In 1 week Gildardo Pounds, NP [place]             insulin glargine (LANTUS SOLOSTAR) 100 UNIT/ML Solostar Pen 15 mL 2  Sig: Inject 65 Units into the skin daily.     Endocrinology:  Diabetes - Insulins Failed - 11/24/2021 10:43 AM      Failed - HBA1C is between 0 and 7.9 and within 180 days    HbA1c, POC (controlled diabetic range)  Date Value Ref Range Status  07/21/2021 9.5 (A) 0.0 - 7.0 % Final   Hgb A1c MFr Bld  Date Value Ref  Range Status  09/17/2021 10.6 (H) 4.8 - 5.6 % Final    Comment:    (NOTE) Pre diabetes:          5.7%-6.4%  Diabetes:              >6.4%  Glycemic control for   <7.0% adults with diabetes           Passed - Valid encounter within last 6 months    Recent Outpatient Visits           4 months ago History of arterial thrombosis   [place], MD   7 months ago No-show for appointment   [place] Karle Plumber B, MD   9 months ago Type 2 diabetes mellitus with morbid obesity [facility])   [place], Deborah B, MD   10 months ago Essential hypertension   [place], Patrick E, MD   1 year ago Preoperative evaluation to rule out surgical contraindication   [place], MD       Future Appointments             In 1 week Gildardo Pounds, NP [place]             nicotine (NICODERM CQ - DOSED IN MG/24 HOURS) 21 mg/24hr patch 28 patch 2    Sig: Place 1 patch (21 mg total) onto the skin daily.     Psychiatry:  Drug Dependence Therapy Passed - 11/24/2021 10:43 AM      Passed - Valid encounter within last 12 months    Recent Outpatient Visits           4 months ago History of arterial thrombosis   [place], MD   7 months ago No-show for appointment   [place] Karle Plumber B, MD   9 months ago Type 2 diabetes mellitus with morbid obesity [facility])   [place] Ladell Pier, MD   10 months ago Essential hypertension   [place] Elsie Stain, MD   1 year ago Preoperative evaluation to rule out surgical contraindication   [place], MD        Future Appointments             In 1 week Gildardo Pounds, NP [place]             glucose blood (ACCU-CHEK GUIDE) test strip 100 each 2    Sig: Use as instructed     Endocrinology: Diabetes - Testing Supplies Passed - 11/24/2021 10:43 AM      Passed - Valid encounter within last 12 months    Recent Outpatient Visits           4  months ago History of arterial thrombosis   [place], MD   7 months ago No-show for appointment   [place] Karle Plumber B, MD   9 months ago Type 2 diabetes mellitus with morbid obesity [facility])   [place], MD   10 months ago Essential hypertension   [place], MD   1 year ago Preoperative evaluation to rule out surgical contraindication   [place], MD       Future Appointments             In 1 week Gildardo Pounds, NP [place]

## 2021-11-24 NOTE — Telephone Encounter (Signed)
Requested Prescriptions  Pending Prescriptions Disp Refills   clopidogrel (PLAVIX) 75 MG tablet 30 tablet 3    Sig: Take 1 tablet (75 mg total) by mouth daily.     Hematology: Antiplatelets - clopidogrel Failed - 11/24/2021 10:43 AM      Failed - HCT in normal range and within 180 days    HCT  Date Value Ref Range Status  10/15/2021 33.4 (L) 36.0 - 46.0 % Final   Hematocrit  Date Value Ref Range Status  07/21/2021 36.8 34.0 - 46.6 % Final         Failed - HGB in normal range and within 180 days    Hemoglobin  Date Value Ref Range Status  10/15/2021 10.1 (L) 12.0 - 15.0 g/dL Final  07/21/2021 11.8 11.1 - 15.9 g/dL Final         Failed - Cr in normal range and within 360 days    Creatinine  Date Value Ref Range Status  02/19/2020 1.88 (H) 0.44 - 1.00 mg/dL Final  07/25/2018 75.1 20.0 - 300.0 mg/dL Final   Creatinine, Ser  Date Value Ref Range Status  10/15/2021 1.57 (H) 0.44 - 1.00 mg/dL Final         Passed - PLT in normal range and within 180 days    Platelets  Date Value Ref Range Status  10/15/2021 376 150 - 400 K/uL Final  07/21/2021 439 150 - 450 x10E3/uL Final         Passed - Valid encounter within last 6 months    Recent Outpatient Visits          4 months ago History of arterial thrombosis   [place], Deborah B, MD   7 months ago No-show for appointment   [place] Ladell Pier, MD   9 months ago Type 2 diabetes mellitus with morbid obesity [facility])   [place] Ladell Pier, MD   10 months ago Essential hypertension   [place], Patrick E, MD   1 year ago Preoperative evaluation to rule out surgical contraindication   [place], MD      Future Appointments            In 1 week Gildardo Pounds, NP [place]             warfarin (COUMADIN) 5 MG tablet      Sig: Take 1-2 tablets (5-10 mg total) by mouth See admin instructions. While on lovenox take 10 mg dailyx4 until inr close to 2- then go to previous schedule(10 mg on MWF, 5 mg on SSTTh).     Hematology:  Anticoagulants - warfarin Failed - 11/24/2021 10:43 AM      Failed - Manual Review: If patient's warfarin is managed by Anti-Coag team, route request to them. If not, route request to the provider.      Failed - INR in normal range and within 30 days    INR  Date Value Ref Range Status  10/15/2021 1.1 0.8 - 1.2 Final    Comment:    (NOTE) INR goal varies based on device and disease states. Performed at [facility], [place] [address], [place], [place] [address]          Failed - HCT in normal range and within 360 days  HCT  Date Value Ref Range Status  10/15/2021 33.4 (L) 36.0 - 46.0 % Final   Hematocrit  Date Value Ref Range Status  07/21/2021 36.8 34.0 - 46.6 % Final         Failed - Valid encounter within last 3 months    Recent Outpatient Visits          4 months ago History of arterial thrombosis   [place], MD   7 months ago No-show for appointment   [place] Karle Plumber B, MD   9 months ago Type 2 diabetes mellitus with morbid obesity [facility])   [place] Ladell Pier, MD   10 months ago Essential hypertension   [place], MD   1 year ago Preoperative evaluation to rule out surgical contraindication   [place], MD      Future Appointments            In 1 week Gildardo Pounds, NP [place] - Patient is not pregnant       VENTOLIN HFA 108 (90 Base) MCG/ACT inhaler 18 g 2    Sig: INHALE TWO PUFFS BY MOUTH EVERY SIX HOURS AS NEEDED FOR  WHEEZING OR SHORTNESS OF BREATH Strength: 108 (90 Base) MCG/ACT     Pulmonology:  Beta Agonists 2 Passed - 11/24/2021 10:43 AM      Passed - Last BP in normal range    BP Readings from Last 1 Encounters:  10/15/21 134/74         Passed - Last Heart Rate in normal range    Pulse Readings from Last 1 Encounters:  10/15/21 85         Passed - Valid encounter within last 12 months    Recent Outpatient Visits          4 months ago History of arterial thrombosis   [place], MD   7 months ago No-show for appointment   [place] Karle Plumber B, MD   9 months ago Type 2 diabetes mellitus with morbid obesity [facility])   [place] Ladell Pier, MD   10 months ago Essential hypertension   [place], MD   1 year ago Preoperative evaluation to rule out surgical contraindication   [place], MD      Future Appointments            In 1 week Gildardo Pounds, NP [place]            insulin glargine (LANTUS SOLOSTAR) 100 UNIT/ML Solostar Pen 15 mL 7    Sig: Inject 65 Units into the skin daily.     Endocrinology:  Diabetes - Insulins Failed - 11/24/2021 10:43 AM      Failed - HBA1C is between 0 and 7.9 and within 180 days    HbA1c, POC (controlled diabetic range)  Date Value Ref Range Status  07/21/2021 9.5 (A) 0.0 - 7.0 % Final   Hgb A1c MFr Bld  Date Value Ref Range Status  09/17/2021 10.6 (H) 4.8 -  5.6 % Final    Comment:    (NOTE) Pre diabetes:          5.7%-6.4%  Diabetes:              >6.4%  Glycemic control for   <7.0% adults with diabetes          Passed - Valid encounter within last 6 months    Recent Outpatient Visits          4 months ago History of arterial thrombosis   [place], MD   7 months ago No-show for appointment   [place] Karle Plumber B, MD   9 months ago Type 2 diabetes mellitus with morbid obesity [facility])   [place] Ladell Pier, MD   10 months ago Essential hypertension   [place], MD   1 year ago Preoperative evaluation to rule out surgical contraindication   [place], MD      Future Appointments            In 1 week Gildardo Pounds, NP [place]            nicotine (NICODERM CQ - DOSED IN MG/24 HOURS) 21 mg/24hr patch 28 patch 0    Sig: Place 1 patch (21 mg total) onto the skin daily.     Psychiatry:  Drug Dependence Therapy Passed - 11/24/2021 10:43 AM      Passed - Valid encounter within last 12 months    Recent Outpatient Visits          4 months ago History of arterial thrombosis   [place], MD   7 months ago No-show for appointment   [place] Karle Plumber B, MD   9 months ago Type 2 diabetes mellitus with morbid obesity [facility])   [place] Ladell Pier, MD   10 months ago Essential hypertension   [place] Elsie Stain, MD   1 year ago Preoperative evaluation to rule out surgical contraindication   [place], MD      Future Appointments            In 1 week Gildardo Pounds, NP [place]            glucose blood (ACCU-CHEK GUIDE) test strip 100 each 0    Sig: Use as instructed     Endocrinology: Diabetes - Testing Supplies Passed - 11/24/2021 10:43 AM      Passed - Valid encounter within last 12 months    Recent Outpatient Visits          4  months ago History of arterial thrombosis   [address] [address], MD   7 months ago No-show for appointment   [place] Ladell Pier, MD   9 months ago Type 2 diabetes mellitus with morbid obesity [facility])   [place] Ladell Pier, MD   10 months ago Essential hypertension   [place] Elsie Stain, MD   1 year ago Preoperative evaluation to  rule out surgical contraindication   [place], MD      Future Appointments            In 1 week Gildardo Pounds, NP [place]

## 2021-11-24 NOTE — Telephone Encounter (Signed)
I spoke to patient twice today.   She first requested warfarin and understands the importance of having regular INR checks. She is aware that she has transportation options to medical appointments through her insurance company but she stated that she may not feel well the day of an appointment and would need to cancel. I informed her that an order for warfarin has been sent to her pharmacy, [facility].  She said that she has not received the warfarin and medications would not be delivered until tomorrow afternoon. It is also noted that an order for plavix has been sent to her pharmacy.   She then decided that she would prefer to be on eliquis and plavix.  She has been on eliquis in the past and would like to take that in lieu of the warfarin because INR checks are not needed.  Informed her that I will share this information with Dr Wynetta Emery   The patient said that she needs a glucometer.  Informed her that an order for the glucometer was sent to [facility] in 10/2021.  She will need to check with the pharmacist there.   She explained that she understands she left the hospital AMA but she feels she needed to be home and is doing better since returning home.  She said that her leg is "healing" and she sees significant improvement of the circulation in her great toe and  2nd and 3rd toes of her left foot. Informed her that Dr Wynetta Emery will most likely want to see her in person.   The patient  was having problems with her phone.    Attempted to contact the patient again and schedule her for an 0810 virtual visit with Dr Wynetta Emery on 11/26/2021 to discuss anticoagulation but the voicemail was full, unable to leave a message. Also need to inform her that she will need to schedule an in person visit with Dr Wynetta Emery.

## 2021-11-24 NOTE — Telephone Encounter (Signed)
[facility] caled and spoke to [employer], [employer]chant [employer] officer about the requested glucose meter. She said it was received in February, but didn't go through with that insurance, so she ran it with another insurance and it was approved. She says every 2 years she can get a new meter. Sent request for BP cuff to provider in another encounter.

## 2021-11-25 ENCOUNTER — Encounter (HOSPITAL_COMMUNITY): Payer: Self-pay | Admitting: Physician Assistant

## 2021-11-25 ENCOUNTER — Telehealth (INDEPENDENT_AMBULATORY_CARE_PROVIDER_SITE_OTHER): Payer: Medicaid Other | Admitting: Physician Assistant

## 2021-11-25 DIAGNOSIS — F339 Major depressive disorder, recurrent, unspecified: Secondary | ICD-10-CM | POA: Diagnosis not present

## 2021-11-25 DIAGNOSIS — G4700 Insomnia, unspecified: Secondary | ICD-10-CM

## 2021-11-25 DIAGNOSIS — F411 Generalized anxiety disorder: Secondary | ICD-10-CM | POA: Diagnosis not present

## 2021-11-25 MED ORDER — RAMELTEON 8 MG PO TABS: 8.0000 mg | ORAL_TABLET | Freq: Every day | ORAL | 1 refills | Status: AC

## 2021-11-25 MED ORDER — BUPROPION HCL ER (XL) 150 MG PO TB24
150.0000 mg | ORAL_TABLET | ORAL | 2 refills | Status: AC
Start: 2021-11-25 — End: 2022-11-25

## 2021-11-25 MED ORDER — SERTRALINE HCL 100 MG PO TABS
200.0000 mg | ORAL_TABLET | Freq: Every morning | ORAL | 2 refills | Status: AC
Start: 2021-11-25 — End: ?

## 2021-11-25 MED ORDER — ACCU-CHEK GUIDE VI STRP
ORAL_STRIP | 2 refills | Status: AC
Start: 2021-11-25 — End: ?

## 2021-11-25 NOTE — Addendum Note (Signed)
Addended by: Karle Plumber B on: 11/25/2021 07:32 PM   Modules accepted: Orders

## 2021-11-25 NOTE — Telephone Encounter (Signed)
Attempted to contact patient # [phone] to inform her that the appointment with Dr Wynetta Emery has been scheduled for 11/27/2021 @ 0950. Voicemail full, unable to leave a message.

## 2021-11-25 NOTE — Telephone Encounter (Signed)
Call placed to patient and informed her that she has been scheduled for a telephone visit with Dr Wynetta Emery 11/27/2021 @ 0950 to discuss her anticoagulation.  She said she understood and she confirmed the phone number where she can be reached # [phone].  She said her son picked up the warfarin today and she took it today and she also has the plavix. She understands that she will need INRs checked regularly if continuing on warfarin.  She is interested in eliquis.   She has a new glucometer but not enough test strips.  She said the pharmacy needs a new prescription written with the order for testing frequency.   She said she was given lantus in the morning when in the hospital but she is taking it at night at home.   She is also concerned about the side effects of gabapentin and would like to know if there is something she could take in its place

## 2021-11-25 NOTE — Telephone Encounter (Signed)
Requested medication (s) are due for refill today - no  Requested medication (s) are on the active medication list -yes  Future visit scheduled -yes  Last refill: 10/01/21 #90 1RF  Notes to clinic: Request RF: Patient not compliant with office follow up- recommendations- does have appointment scheduled 12/01/21-sent to office for request review   Requested Prescriptions  Pending Prescriptions Disp Refills   gabapentin (NEURONTIN) 400 MG capsule [Pharmacy Med Name: GABAPENTIN 400 MG ORAL CAPSULE] 90 capsule 1    Sig: TAKE 1 CAPSULE (400 MG TOTAL) BY MOUTH 3 (THREE) TIMES DAILY.     Neurology: Anticonvulsants - gabapentin Failed - 11/24/2021  2:29 PM      Failed - Cr in normal range and within 360 days    Creatinine  Date Value Ref Range Status  02/19/2020 1.88 (H) 0.44 - 1.00 mg/dL Final  07/25/2018 75.1 20.0 - 300.0 mg/dL Final   Creatinine, Ser  Date Value Ref Range Status  10/15/2021 1.57 (H) 0.44 - 1.00 mg/dL Final          Passed - Completed PHQ-2 or PHQ-9 in the last 360 days      Passed - Valid encounter within last 12 months    Recent Outpatient Visits           4 months ago History of arterial thrombosis   [place], MD   7 months ago No-show for appointment   [place] Karle Plumber B, MD   9 months ago Type 2 diabetes mellitus with morbid obesity [facility])   [place], Deborah B, MD   10 months ago Essential hypertension   [place], MD   1 year ago Preoperative evaluation to rule out surgical contraindication   [place], MD       Future Appointments             In 6 days Gildardo Pounds, NP [place]            Signed Prescriptions Disp Refills   Accu-Chek Softclix Lancets lancets 100 each 0     Sig: USE AS INSTRUCTED 3 TIMES A DAY     Endocrinology: Diabetes - Testing Supplies Passed - 11/24/2021  2:29 PM      Passed - Valid encounter within last 12 months    Recent Outpatient Visits           4 months ago History of arterial thrombosis   [place], MD   7 months ago No-show for appointment   [place] Karle Plumber B, MD   9 months ago Type 2 diabetes mellitus with morbid obesity [facility])   [place], MD   10 months ago Essential hypertension   [place], MD   1 year ago Preoperative evaluation to rule out surgical contraindication   [place], MD       Future Appointments             In 6 days Gildardo Pounds, NP [place]               Requested Prescriptions  Pending Prescriptions Disp Refills   gabapentin (NEURONTIN) 400 MG capsule [Pharmacy Med Name: GABAPENTIN 400 MG ORAL CAPSULE] 90 capsule 1    Sig: TAKE 1 CAPSULE (400 MG TOTAL) BY MOUTH 3 (THREE) TIMES DAILY.     Neurology: Anticonvulsants - gabapentin Failed - 11/24/2021  2:29 PM      Failed - Cr in normal range and within 360 days    Creatinine  Date Value Ref Range Status  02/19/2020 1.88 (H) 0.44 - 1.00 mg/dL Final  07/25/2018 75.1 20.0 - 300.0 mg/dL Final   Creatinine, Ser  Date Value Ref Range Status  10/15/2021 1.57 (H) 0.44 - 1.00 mg/dL Final          Passed - Completed PHQ-2 or PHQ-9 in the last 360 days      Passed - Valid encounter within last 12 months    Recent Outpatient Visits           4 months ago History of arterial thrombosis   [place], MD   7 months ago No-show for appointment   [place] Karle Plumber B, MD   9  months ago Type 2 diabetes mellitus with morbid obesity [facility])   [place], Deborah B, MD   10 months ago Essential hypertension   [place], MD   1 year ago Preoperative evaluation to rule out surgical contraindication   [place], MD       Future Appointments             In 6 days Gildardo Pounds, NP [place]            Signed Prescriptions Disp Refills   Accu-Chek Softclix Lancets lancets 100 each 0    Sig: USE AS INSTRUCTED 3 TIMES A DAY     Endocrinology: Diabetes - Testing Supplies Passed - 11/24/2021  2:29 PM      Passed - Valid encounter within last 12 months    Recent Outpatient Visits           4 months ago History of arterial thrombosis   [place], MD   7 months ago No-show for appointment   [place] Karle Plumber B, MD   9 months ago Type 2 diabetes mellitus with morbid obesity [facility])   [place], MD   10 months ago Essential hypertension   [place], MD   1 year ago Preoperative evaluation to rule out surgical contraindication   [place], MD       Future Appointments             In 6 days Gildardo Pounds, NP [place]

## 2021-11-25 NOTE — Telephone Encounter (Signed)
Requested Prescriptions  Pending Prescriptions Disp Refills   Accu-Chek Softclix Lancets lancets [Pharmacy Med Name: ACCU-CHEK SOFTCLIX LANCETS] 100 each 0    Sig: USE AS INSTRUCTED 3 TIMES A DAY     Endocrinology: Diabetes - Testing Supplies Passed - 11/24/2021  2:29 PM      Passed - Valid encounter within last 12 months    Recent Outpatient Visits          4 months ago History of arterial thrombosis   [place], MD   7 months ago No-show for appointment   [place] Karle Plumber B, MD   9 months ago Type 2 diabetes mellitus with morbid obesity [facility])   [place], MD   10 months ago Essential hypertension   [place], MD   1 year ago Preoperative evaluation to rule out surgical contraindication   [place], MD      Future Appointments            In 6 days Gildardo Pounds, NP [place]            gabapentin (NEURONTIN) 400 MG capsule [Pharmacy Med Name: GABAPENTIN 400 MG ORAL CAPSULE] 90 capsule 1    Sig: TAKE 1 CAPSULE (400 MG TOTAL) BY MOUTH 3 (THREE) TIMES DAILY.     Neurology: Anticonvulsants - gabapentin Failed - 11/24/2021  2:29 PM      Failed - Cr in normal range and within 360 days    Creatinine  Date Value Ref Range Status  02/19/2020 1.88 (H) 0.44 - 1.00 mg/dL Final  07/25/2018 75.1 20.0 - 300.0 mg/dL Final   Creatinine, Ser  Date Value Ref Range Status  10/15/2021 1.57 (H) 0.44 - 1.00 mg/dL Final         Passed - Completed PHQ-2 or PHQ-9 in the last 360 days      Passed - Valid encounter within last 12 months    Recent Outpatient Visits          4 months ago History of arterial thrombosis   [place], MD   7 months ago No-show for  appointment   [place] Karle Plumber B, MD   9 months ago Type 2 diabetes mellitus with morbid obesity [facility])   [place], MD   10 months ago Essential hypertension   [place], MD   1 year ago Preoperative evaluation to rule out surgical contraindication   [place], Deborah B, MD      Future Appointments            In 6 days Gildardo Pounds, NP [place]

## 2021-11-25 NOTE — Progress Notes (Signed)
[facility] MD/PA/NP OP Progress Note  Virtual Visit via Telephone Note  I connected with [name] on 11/25/21 at  1:00 PM EST by telephone and verified that I am speaking with the correct person using two identifiers.  Location: Patient: Home Provider: Clinic   I discussed the limitations, risks, security and privacy concerns of performing an evaluation and management service by telephone and the availability of in person appointments. I also discussed with the patient that there may be a patient responsible charge related to this service. The patient expressed understanding and agreed to proceed.  Follow Up Instructions:   I discussed the assessment and treatment plan with the patient. The patient was provided an opportunity to ask questions and all were answered. The patient agreed with the plan and demonstrated an understanding of the instructions.   The patient was advised to call back or seek an in-person evaluation if the symptoms worsen or if the condition fails to improve as anticipated.  I provided 30 minutes of non-face-to-face time during this encounter.  Malachy Mood, PA   11/25/2021 7:41 PM [name]  MRN:  [id number]  Chief Complaint:  Chief Complaint  Patient presents with   Follow-up   HPI:   Lekeisha K. Kronick is a 46 year old female with a past psychiatric history significant for major depressive disorder, anxiety, and insomnia who presents to [facility] via virtual telephone visit for follow-up and medication management.  Patient is currently being managed on the following medications:  Hydroxyzine 50 mg 3 times daily as needed Clonazepam 1 mg 3 times daily as needed Bupropion (Wellbutrin SR) 150 mg 12-hour tablet 2 times daily Sertraline 150 mg daily Trazodone 100 mg daily  Patient reports that she was in the hospital for the management of her blood clots and had undergone surgery.  She reports that she had 2  stents and an artery placed.  While being treated in the hospital, patient states that she was given heparin and antibiotics.  Since being discharged from the hospital, patient states that she has been on edge.  She reports that she is currently without a home and is living with her ex.  Patient endorses depression with the main symptom being loneliness.  She reports being tired of being alone and states that her loneliness is often alleviated by her children visiting.  Patient also reports being emotional stating that her feelings are hurt more readily.  Patient reports that she also experiences staying up for days at a time.  Patient states that she has been taking her medications as prescribed but has stopped taking trazodone.  Patient's reason for discontinuing trazodone was due to the medication putting her into a deep sleep.  Patient reports that Klonopin is still helpful in the management of her anxiety but patient expresses that she continues to worry about everything.  A PHQ-9 screen was performed with the patient scoring an 18.  A GAD-7 screen was also performed with the patient scoring a 17.  Patient is alert and oriented x4, calm, cooperative, and fully engaged in conversation during the encounter.  Patient states that she is calm now that she is with her grandkids.  Patient denies suicidal or homicidal ideations.  She further denies auditory or visual hallucinations and does not appear to be responding to internal/external stimuli.  Patient endorses poor sleep and receives on average 5 hours of sleep each night.  She states that her sleep is characterized by constantly tossing  and turning.  Patient endorses decreased appetite and eats on average 1 meal and some protein shakes during the day.  Patient denies alcohol consumption.  Patient endorses tobacco use and smokes between 3 and 15 cigarettes/day.  Patient endorses illicit drug use in the form of marijuana.  Visit Diagnosis:    ICD-10-CM   1.  Insomnia, unspecified type  G47.00 ramelteon (ROZEREM) 8 MG tablet    2. Major depressive disorder, recurrent episode with anxious distress (HCC)  F33.9 sertraline (ZOLOFT) 100 MG tablet    buPROPion (WELLBUTRIN XL) 150 MG 24 hr tablet    3. Anxiety state  F41.1 sertraline (ZOLOFT) 100 MG tablet      Past Psychiatric History:  Major depressive disorder Anxiety Insomnia  Past Medical History:  Past Medical History:  Diagnosis Date   Anxiety    Depression    Diabetes ([place])    Type 2   GERD (gastroesophageal reflux disease)    History of blood clots    ,DVT-left leg, and early 2000, had blood clot behind left breast   History of kidney stones    Hypercalcemia    Hyperparathyroidism    Hypertension    had been on Lisinopril and PCP took her off it 4 months ago   Iron deficiency anemia    Left thyroid nodule    Mass of right ovary    Nephrolithiasis    PAOD (peripheral arterial occlusive disease) (HCC)    Peripheral vascular disease (HCC)    Prolonged Q-T interval on ECG 04/19/2019   Renal calculi    Substance abuse ([place])    Tooth loose    top tooth from the right  is loose     Past Surgical History:  Procedure Laterality Date   ABDOMINAL AORTOGRAM W/LOWER EXTREMITY N/A 11/14/2017   Procedure: ABDOMINAL AORTOGRAM W/LOWER EXTREMITY;  Surgeon: Waynetta Sandy, MD;  Location: [place] [facility];  Service: Cardiovascular;  Laterality: N/A;   ANGIOPLASTY ILLIAC ARTERY Left 02/25/2018   Procedure: BALLOON ANGIOPLASTY LEFT ILIAC ARTERY;  Surgeon: Conrad [place], MD;  Location: [place];  Service: Vascular;  Laterality: Left;   APPLICATION OF WOUND VAC Left 03/03/2018   Procedure: APPLICATION OF WOUND VAC;  Surgeon: Angelia Mould, MD;  Location: [facility];  Service: Vascular;  Laterality: Left;   CYSTOSCOPY W/ URETERAL STENT PLACEMENT Right 06/27/2018   Procedure: CYSTOSCOPY WITH RETROGRADE PYELOGRAM/URETERAL DOUBLE J STENT PLACEMENT;  Surgeon: Ceasar Mons,  MD;  Location: [place];  Service: Urology;  Laterality: Right;   CYSTOSCOPY WITH RETROGRADE PYELOGRAM, URETEROSCOPY AND STENT PLACEMENT Right 04/03/2020   Procedure: CYSTOSCOPY WITH RIGHT  RETROGRADE PYELOGRAM, URETEROSCOPY WITH HOLMIUM LASER AND STENT EXCHANGE PLACEMENT;  Surgeon: Ardis Hughs, MD;  [facility];  Service: Urology;  Laterality: Right;   CYSTOSCOPY WITH STENT PLACEMENT N/A 02/23/2019   Procedure: CYSTOSCOPY WITH RIGHT URETERAL STENT EXCHANGE/ LEFT URETERAL STENT PLACEMENT;  Surgeon: Ceasar Mons, MD;  [facility];  Service: Urology;  Laterality: N/A;   CYSTOSCOPY WITH STENT PLACEMENT Right 06/11/2019   Procedure: CYSTOSCOPY,RIGHT RETROGRADE WITH STENT PLACEMENT;  Surgeon: Irine Seal, MD;  [facility];  Service: Urology;  Laterality: Right;   CYSTOSCOPY WITH STENT PLACEMENT Right 03/03/2020   Procedure: CYSTOSCOPY WITH STENT PLACEMENT retroagrade pylerogram;  Surgeon: Ardis Hughs, MD;  [facility];  Service: Urology;  Laterality: Right;   CYSTOSCOPY/RETROGRADE/URETEROSCOPY/STONE EXTRACTION WITH BASKET Bilateral 05/01/2019   Procedure: CYSTOSCOPY/URETEROSCOPY/STONE EXTRACTION / LASER LITHOTRIPSY, BILATERAL URETEROSCOPY WITH URETERAL STONE EXTRACTION  AND STENT EXCHANGE;  Surgeon: Ceasar Mons, MD;  Location[facility];  Service: Urology;  Laterality: Bilateral;   CYSTOSCOPY/URETEROSCOPY/HOLMIUM LASER/STENT PLACEMENT Right 06/27/2019   Procedure: CYSTOSCOPY/URETEROSCOPY/HOLMIUM LASER/STENT EXCHANGE;  Surgeon: Ceasar Mons, MD;  [facility];  Service: Urology;  Laterality: Right;   EMBOLECTOMY Left 02/24/2017   Procedure: Left Lower Extremity Embolectomy and Angiogram.;  Surgeon: Waynetta Sandy, MD;  Location: [place];  Service: Vascular;  Laterality: Left;   INSERTION OF ILIAC STENT  02/24/2017   Procedure: INSERTION OF Common ILIAC STENT;  Surgeon: Waynetta Sandy, MD;  Location: [place];  Service: Vascular;;   INTRAOPERATIVE ARTERIOGRAM Left 02/25/2018   Procedure: INTRA OPERATIVE ARTERIOGRAM WITH LEFT LEG RUNOFF;  Surgeon: Conrad [place], MD;  Location: [place];  Service: Vascular;  Laterality: Left;   LOWER EXTREMITY ANGIOGRAM Left 02/24/2017   Procedure: Aortagram, Left lower extremity Run-off;  Surgeon: Waynetta Sandy, MD;  Location: [place];  Service: Vascular;  Laterality: Left;   LOWER EXTREMITY ANGIOGRAM Left 12/28/2020   Procedure: AORTOGRAM, LEFT LOWER EXTREMITY ANGIOGRAM, THROMBOLYSIS VIA LEFT BRACHIAL ARTERY APPROACH;  Surgeon: Cherre Robins, MD;  Location: [place];  Service: Vascular;  Laterality: Left;   LOWER EXTREMITY ANGIOGRAPHY N/A 12/29/2020   Procedure: LYSIS RECHECK;  Surgeon: Waynetta Sandy, MD;  Location: [place] [facility];  Service: Cardiovascular;  Laterality: N/A;   LOWER EXTREMITY ANGIOGRAPHY Left 09/17/2021   Procedure: Lower Extremity Angiography;  Surgeon: Marty Heck, MD;  Location: [place] [facility];  Service: Cardiovascular;  Laterality: Left;   PATCH ANGIOPLASTY Left 02/25/2018   Procedure: PATCH ANGIOPLASTY LEFT SUPERFICIAL FEMORAL ARTERY WITH BOVINE PATCH;  Surgeon: Conrad [place], MD;  Location: [place];  Service: Vascular;  Laterality: Left;   PERCUTANEOUS VENOUS THROMBECTOMY,LYSIS WITH INTRAVASCULAR ULTRASOUND (IVUS) Left 05/12/2018   Procedure: MECHANICAL THROMBECTOMY LEFT LEG, BALLOON ANGIOPLASTY LEFT ANTERIOR TIBIAL ARTERY, AORTOGRAM WITH LEFT LEG RUNOFF;  Surgeon: Serafina Mitchell, MD;  Location: [place];  Service: Vascular;  Laterality: Left;   PERIPHERAL VASCULAR INTERVENTION  09/18/2021   Procedure: PERIPHERAL VASCULAR INTERVENTION;  Surgeon: Cherre Robins, MD;  Location: [place] [facility];  Service: Cardiovascular;;   PERIPHERAL VASCULAR THROMBECTOMY  12/29/2020   Procedure: PERIPHERAL VASCULAR THROMBECTOMY;  Surgeon: Waynetta Sandy, MD;  Location: [place] [facility];  Service: Cardiovascular;;    PERIPHERAL VASCULAR THROMBECTOMY Left 09/17/2021   Procedure: PERIPHERAL VASCULAR THROMBECTOMY;  Surgeon: Marty Heck, MD;  Location: [place] [facility];  Service: Cardiovascular;  Laterality: Left;  lysis   PERIPHERAL VASCULAR THROMBECTOMY N/A 09/18/2021   Procedure: LYSIS RECHECK;  Surgeon: Cherre Robins, MD;  Location: [place] [facility];  Service: Cardiovascular;  Laterality: N/A;   removal of parathyroid adenoma  5/11   THROMBECTOMY FEMORAL ARTERY Left 02/25/2018   Procedure: LEFT ILIAC AND POPLITEAL ARTERY THROMBECTOMY;  Surgeon: Conrad [place], MD;  Location: [place];  Service: Vascular;  Laterality: Left;   TUBAL LIGATION     WOUND DEBRIDEMENT Left 03/03/2018   Procedure: DEBRIDEMENT WOUND;  Surgeon: Angelia Mould, MD;  Location: [place];  Service: Vascular;  Laterality: Left;   WOUND EXPLORATION Left 03/03/2018   Procedure: WOUND EXPLORATION;  Surgeon: Angelia Mould, MD;  Location: [place];  Service: Vascular;  Laterality: Left;    Family Psychiatric History:  Mother - Depression, committed suicide 8 years ago  Family History:  Family History  Problem Relation Age of Onset   Depression  Mother    Diabetes Father    Heart failure Father     Social History:  Social History   Socioeconomic History   Marital status: Single    Spouse name: Not on file   Number of children: Not on file   Years of education: Not on file   Highest education level: Not on file  Occupational History   Not on file  Tobacco Use   Smoking status: Some Days    Packs/day: 0.50    Years: 15.00    Pack years: 7.50    Types: Cigarettes   Smokeless tobacco: Never  Vaping Use   Vaping Use: Never used  Substance and Sexual Activity   Alcohol use: Yes    Comment: occassionally   Drug use: Yes    Types: Marijuana, Cocaine    Comment: once daily- last time used was night of 02/28/2020   Sexual activity: Yes    Partners: Male    Birth control/protection: Other-see comments     Comment: BTL  Other Topics Concern   Not on file  Social History Narrative   Not on file   Social Determinants of Health   Financial Resource Strain: Not on file  Food Insecurity: Not on file  Transportation Needs: Not on file  Physical Activity: Not on file  Stress: Not on file  Social Connections: Not on file    Allergies:  Allergies  Allergen Reactions   Ciprofloxacin Hcl Hives   Dilaudid [Hydromorphone Hcl] Shortness Of Breath and Other (See Comments)    "Asystole," per pt report   Macrobid [Nitrofurantoin Macrocrystal] Hives, Shortness Of Breath and Rash   Other Hives, Shortness Of Breath and Rash    NO "-CILLINS"!!!   Penicillins Shortness Of Breath    Had had cephalosporins without incident Has patient had a PCN reaction causing immediate rash, facial/tongue/throat swelling, SOB or lightheadedness with hypotension: Yes Has patient had a PCN reaction causing severe rash involving mucus membranes or skin necrosis: Unk Has patient had a PCN reaction that required hospitalization: Unk Has patient had a PCN reaction occurring within the last 10 years: No If all of the above answers are "NO", then may proceed with Cephalosporin use.    Sulfa Antibiotics Hives, Shortness Of Breath and Rash   Lexapro [Escitalopram Oxalate] Other (See Comments)    "I just did not like it."    Metabolic Disorder Labs: Lab Results  Component Value Date   HGBA1C 10.6 (H) 09/17/2021   MPG 257.52 09/17/2021   MPG 340.75 12/27/2020   No results found for: PROLACTIN Lab Results  Component Value Date   CHOL 152 07/21/2021   TRIG 188 (H) 07/21/2021   HDL 35 (L) 07/21/2021   CHOLHDL 4.3 07/21/2021   VLDL 47 (H) 06/27/2018   LDLCALC 85 07/21/2021   LDLCALC 103 (H) 07/29/2020   Lab Results  Component Value Date   TSH 0.983 07/30/2019    Therapeutic Level Labs: No results found for: LITHIUM No results found for: VALPROATE No components found for:  CBMZ  Current Medications: Current  Outpatient Medications  Medication Sig Dispense Refill   buPROPion (WELLBUTRIN XL) 150 MG 24 hr tablet Take 1 tablet (150 mg total) by mouth every morning. 30 tablet 2   ramelteon (ROZEREM) 8 MG tablet Take 1 tablet (8 mg total) by mouth at bedtime. 30 tablet 1   Accu-Chek Softclix Lancets lancets USE AS INSTRUCTED 3 TIMES A DAY 100 each 0   acetaminophen (TYLENOL) 500 MG tablet  Take 1,000-2,000 mg by mouth daily as needed (pain).     amLODipine (NORVASC) 5 MG tablet Take 1 tablet (5 mg total) by mouth daily. (Patient taking differently: Take 5 mg by mouth every morning.) 30 tablet 11   aspirin 81 MG EC tablet TAKE 1 TABLET (81 MG TOTAL) BY MOUTH DAILY. SWALLOW WHOLE. (Patient taking differently: Take 81 mg by mouth every morning.) 90 tablet 3   atorvastatin (LIPITOR) 40 MG tablet TAKE 1 TABLET (40 MG TOTAL) BY MOUTH DAILY. (Patient taking differently: Take 40 mg by mouth every morning.) 90 tablet 3   Blood Glucose Monitoring Suppl (ACCU-CHEK GUIDE) w/Device KIT Use to check blood sugar 3x daily. 1 kit 0   carvedilol (COREG) 3.125 MG tablet TAKE ONE TABLET BY MOUTH TWICE DAILY (Patient not taking: Reported on 10/11/2021) 60 tablet 0   clonazePAM (KLONOPIN) 1 MG tablet TAKE 1 TABLET (1 MG TOTAL) BY MOUTH 3 (THREE) TIMES DAILY AS NEEDED FOR ANXIETY. 90 tablet 2   clopidogrel (PLAVIX) 75 MG tablet Take 1 tablet (75 mg total) by mouth daily. 30 tablet 0   Continuous Blood Gluc Receiver (FREESTYLE LIBRE 2 READER) DEVI Check blood sugar TID. 1 each 2   diphenhydramine-acetaminophen (TYLENOL PM) 25-500 MG TABS tablet Take 2-4 tablets by mouth at bedtime as needed (sleep).     enoxaparin (LOVENOX) 120 MG/0.8ML injection Inject 1 syringe (120 mg total) into the skin every 12 (twelve) hours for 5 days. 8 mL 0   gabapentin (NEURONTIN) 400 MG capsule TAKE 1 CAPSULE (400 MG TOTAL) BY MOUTH 3 (THREE) TIMES DAILY. 90 capsule 0   glucose blood (ACCU-CHEK GUIDE) test strip Check blood sugars three times a day before  meals and at bedtime 100 each 2   hydrOXYzine (ATARAX) 25 MG tablet Take 2 tablets (50 mg total) by mouth 3 (three) times daily as needed for anxiety. 60 tablet 0   insulin glargine (LANTUS SOLOSTAR) 100 UNIT/ML Solostar Pen Inject 65 Units into the skin daily. 15 mL 2   nicotine (NICODERM CQ - DOSED IN MG/24 HOURS) 21 mg/24hr patch Place 1 patch (21 mg total) onto the skin daily. 28 patch 2   nitroGLYCERIN (NITROSTAT) 0.4 MG SL tablet PLACE 1 TABLET (0.4 MG TOTAL) UNDER THE TONGUE EVERY 5 (FIVE) MINUTES AS NEEDED FOR CHEST PAIN. (Patient taking differently: Place 0.4 mg under the tongue every 5 (five) minutes as needed for chest pain.) 25 tablet 2   NOVOLOG FLEXPEN 100 UNIT/ML FlexPen Use as directed per sliding scale, Pt is instructed about direction, Maximum 45 units per day  CBG 70 - 120: 0 units CBG 121 - 150: 2 units CBG 151 - 200: 3 units CBG 201 - 250: 5 units CBG 251 - 300: 8 units CBG 301 - 350: 11 units CBG 351 - 400: 15 units CBG > 400: Call Primary provider (Patient taking differently: Inject 2-15 Units into the skin 3 (three) times daily with meals. Use as directed per sliding scale, Pt is instructed about direction, Maximum 45 units per day  CBG 70 - 120: 0 units CBG 121 - 150: 2 units CBG 151 - 200: 3 units CBG 201 - 250: 5 units CBG 251 - 300: 8 units CBG 301 - 350: 11 units CBG 351 - 400: 15 units CBG > 400: Call Primary provider) 15 mL 5   olopatadine (PATANOL) 0.1 % ophthalmic solution Place 1 drop into both eyes 2 (two) times daily. (Patient taking differently: Place 1 drop into both eyes  3 (three) times daily as needed for allergies.) 5 mL 1   ondansetron (ZOFRAN) 4 MG tablet Take 1 tablet (4 mg total) by mouth every 8 (eight) hours as needed for nausea or vomiting. 20 tablet 0   oxyCODONE-acetaminophen (PERCOCET/ROXICET) 5-325 MG tablet Take 1 tablet by mouth every 8 (eight) hours as needed for up to 12 doses for moderate pain. 12 tablet 0   sertraline (ZOLOFT) 100 MG tablet Take 2  tablets (200 mg total) by mouth every morning. 60 tablet 2   TRUEPLUS INSULIN SYRINGE 31G X 5/16" 1 ML MISC USE AS DIRECTED 100 each 3   VENTOLIN HFA 108 (90 Base) MCG/ACT inhaler INHALE TWO PUFFS BY MOUTH EVERY SIX HOURS AS NEEDED FOR WHEEZING OR SHORTNESS OF BREATH Strength: 108 (90 Base) MCG/ACT 18 g 2   warfarin (COUMADIN) 5 MG tablet Take two tablets (10 mg) on MWF with 1 tablet (5 mg) on all other days. 60 tablet 0   No current facility-administered medications for this visit.     Musculoskeletal: Strength & Muscle Tone: Unable to assess due to telemedicine visit [place]: Unable to assess due to telemedicine visit Patient leans: Unable to assess due to telemedicine visit  Psychiatric Specialty Exam: Review of Systems  Psychiatric/Behavioral:  Positive for decreased concentration, dysphoric mood and sleep disturbance. Negative for hallucinations, self-injury and suicidal ideas. The patient is nervous/anxious. The patient is not hyperactive.    There were no vitals taken for this visit.There is no height or weight on file to calculate BMI.  General Appearance: Unable to assess due to telemedicine visit  Eye Contact:  Unable to assess due to telemedicine visit  Speech:  Clear and Coherent and Normal Rate  Volume:  Normal  Mood:  Anxious, Depressed, and Dysphoric  Affect:  Congruent, Depressed, and Tearful  Thought Process:  Coherent, Goal Directed, and Descriptions of Associations: Intact  Orientation:  Full (Time, Place, and Person)  Thought Content: WDL, Rumination, and Tangential   Suicidal Thoughts:  No  Homicidal Thoughts:  No  Memory:  Immediate;   Good Recent;   Good Remote;   Good  Judgement:  Fair  Insight:  Fair  Psychomotor Activity:  Normal  Concentration:  Concentration: Good and Attention Span: Good  Recall:  Good  Fund of Knowledge: Good  Language: Good  Akathisia:  Negative  Handed:  Right  AIMS (if indicated): not done  Assets:  Communication  Skills Desire for Improvement Financial Resources/Insurance Housing  ADL's:  Intact  Cognition: WNL  Sleep:  Poor   Screenings: GAD-7    Flowsheet Row Video Visit from 11/25/2021 in [facility] Video Visit from 07/22/2021 in [facility] Office Visit from 07/21/2021 in [place] Video Visit from 06/09/2021 in [facility] Office Visit from 02/17/2021 in [place]  Total GAD-7 Score 17 10 7 18 19       PHQ2-9    Flowsheet Row Video Visit from 11/25/2021 in [facility] Video Visit from 07/22/2021 in [facility] Office Visit from 07/21/2021 in [place] Video Visit from 06/09/2021 in [facility] Office Visit from 02/17/2021 in [place]  PHQ-2 Total Score 4 2 2 3 2   PHQ-9 Total Score 18 9 9 19 9       Flowsheet Row Video Visit from 11/25/2021 in  [place] [facility] to Hosp-Admission (Discharged) from 10/11/2021 in [place] ED to Hosp-Admission (Discharged) from 09/17/2021 in [facility] 4E CV SURGICAL PROGRESSIVE CARE  C-SSRS RISK CATEGORY Low Risk No Risk No Risk        Assessment and Plan:   Vandana K. Wooldridge is a 46 year old female with a past psychiatric history significant for major depressive disorder, anxiety, and insomnia who presents to [facility] via virtual telephone visit for follow-up and medication management.  Patient reports that she was recently discharged from the hospital and has been experiencing worsening anxiety and depression.  Patient has discontinued taking her trazodone after experiencing too deep of sleep.  Patient was recommended increasing her dosage of sertraline from 150 mg to 200 mg daily for the  management of her depression and anxiety.  Patient's Wellbutrin was also changed from Wellbutrin SR 150 mg to Wellbutrin XL 150 mg 24-hour tablet daily.  Lastly, patient to be placed on ramelteon 8 mg at bedtime for the management of her sleep.  Patient was agreeable to recommendations.  Patient's medications to be e-prescribed to pharmacy of choice.  Collaboration of Care: Collaboration of Care: Medication Management AEB provider managing patient's psychiatric medications and Psychiatrist AEB patient being seen by a psychiatric provider  Patient/Guardian was advised Release of Information must be obtained prior to any record release in order to collaborate their care with an outside provider. Patient/Guardian was advised if they have not already done so to contact the registration department to sign all necessary forms in order for [name] to release information regarding their care.   Consent: Patient/Guardian gives verbal consent for treatment and assignment of benefits for services provided during this visit. Patient/Guardian expressed understanding and agreed to proceed.   1. Major depressive disorder, recurrent episode with anxious distress (HCC)  - sertraline (ZOLOFT) 100 MG tablet; Take 2 tablets (200 mg total) by mouth every morning.  Dispense: 60 tablet; Refill: 2 - buPROPion (WELLBUTRIN XL) 150 MG 24 hr tablet; Take 1 tablet (150 mg total) by mouth every morning.  Dispense: 30 tablet; Refill: 2  2. Anxiety state  - sertraline (ZOLOFT) 100 MG tablet; Take 2 tablets (200 mg total) by mouth every morning.  Dispense: 60 tablet; Refill: 2  3. Insomnia, unspecified type  - ramelteon (ROZEREM) 8 MG tablet; Take 1 tablet (8 mg total) by mouth at bedtime.  Dispense: 30 tablet; Refill: 1  Patient to follow up in 2 months Provider spent a total of 30 minutes with the patient/reviewing patient's chart  Malachy Mood, PA 11/25/2021, 7:41 PM

## 2021-11-27 ENCOUNTER — Ambulatory Visit: Payer: Medicaid Other | Admitting: Internal Medicine

## 2021-12-01 ENCOUNTER — Telehealth: Payer: Medicaid Other | Admitting: Nurse Practitioner

## 2021-12-02 ENCOUNTER — Encounter: Payer: Self-pay | Admitting: Internal Medicine

## 2021-12-02 ENCOUNTER — Ambulatory Visit: Payer: Medicaid Other | Admitting: Internal Medicine

## 2021-12-02 NOTE — Progress Notes (Signed)
Patient no showed her in-person appointment with me today.  I tried calling her on her phone to do it as a telephone visit but there was no answer. ?

## 2021-12-30 ENCOUNTER — Other Ambulatory Visit: Payer: Self-pay | Admitting: Critical Care Medicine

## 2021-12-30 ENCOUNTER — Other Ambulatory Visit (HOSPITAL_COMMUNITY): Payer: Self-pay | Admitting: Physician Assistant

## 2021-12-30 ENCOUNTER — Other Ambulatory Visit: Payer: Self-pay | Admitting: Internal Medicine

## 2021-12-30 DIAGNOSIS — I10 Essential (primary) hypertension: Secondary | ICD-10-CM

## 2021-12-30 DIAGNOSIS — F411 Generalized anxiety disorder: Secondary | ICD-10-CM

## 2022-01-12 ENCOUNTER — Other Ambulatory Visit (HOSPITAL_COMMUNITY): Payer: Self-pay | Admitting: Physician Assistant

## 2022-01-12 DIAGNOSIS — F411 Generalized anxiety disorder: Secondary | ICD-10-CM

## 2022-01-15 ENCOUNTER — Other Ambulatory Visit: Payer: Self-pay | Admitting: Internal Medicine

## 2022-01-15 DIAGNOSIS — R079 Chest pain, unspecified: Secondary | ICD-10-CM

## 2022-01-19 ENCOUNTER — Telehealth: Payer: Self-pay | Admitting: Internal Medicine

## 2022-01-19 NOTE — Telephone Encounter (Signed)
Copied from [place] [id number]. Topic: General - Other ?>> Jan 19, 2022 12:52 PM Pawlus, Brayton Layman A wrote: ?Reason for CRM: Caller from [place] was calling to see if Dr Wynetta Emery would be able to sign the death certificate, please advise. ?

## 2022-01-19 NOTE — Telephone Encounter (Signed)
Returned call to [employer] geologist and made them aware that Dr. Wynetta Emery will be able to sign death certificate but she is currently out of the office but I can let the covering provider be aware. Per caller they will send death certificate to [facility] to be signed electronically.  ?

## 2022-01-20 ENCOUNTER — Telehealth (HOSPITAL_COMMUNITY): Payer: Medicare Other | Admitting: Physician Assistant

## 2022-01-20 NOTE — Telephone Encounter (Signed)
Death certificate has been completed.

## 2022-01-20 NOTE — Telephone Encounter (Signed)
Contacted [employer] and [facility] and spoke to [place] and he provided the number to [facility] [phone] and Apolonio Schneiders is the EMS on scene to get more information. Per Marylyn Ishihara pt passed away on 01/23/2022 at the [facility]   ?

## 2022-01-20 NOTE — Telephone Encounter (Signed)
I do not have enough information to complete this. I will need the date and time of death. Cause of death as well thanks. ?

## 2022-02-01 DEATH — deceased

## 2022-02-04 ENCOUNTER — Other Ambulatory Visit: Payer: Self-pay | Admitting: Internal Medicine
